# Patient Record
Sex: Female | Born: 1960 | Race: White | Hispanic: No | Marital: Married | Smoking: Never smoker
Health system: Southern US, Community
[De-identification: ages and names within clinical notes are randomized; demographics above are authoritative.]

## PROBLEM LIST (undated history)

## (undated) DIAGNOSIS — E039 Hypothyroidism, unspecified: Secondary | ICD-10-CM

## (undated) DIAGNOSIS — C187 Malignant neoplasm of sigmoid colon: Secondary | ICD-10-CM

## (undated) DIAGNOSIS — R112 Nausea with vomiting, unspecified: Secondary | ICD-10-CM

## (undated) DIAGNOSIS — R0600 Dyspnea, unspecified: Secondary | ICD-10-CM

## (undated) DIAGNOSIS — I5189 Other ill-defined heart diseases: Secondary | ICD-10-CM

## (undated) DIAGNOSIS — R002 Palpitations: Secondary | ICD-10-CM

## (undated) DIAGNOSIS — J449 Chronic obstructive pulmonary disease, unspecified: Secondary | ICD-10-CM

## (undated) DIAGNOSIS — I10 Essential (primary) hypertension: Secondary | ICD-10-CM

## (undated) DIAGNOSIS — D649 Anemia, unspecified: Secondary | ICD-10-CM

## (undated) DIAGNOSIS — G4730 Sleep apnea, unspecified: Secondary | ICD-10-CM

## (undated) DIAGNOSIS — R531 Weakness: Secondary | ICD-10-CM

## (undated) DIAGNOSIS — M1990 Unspecified osteoarthritis, unspecified site: Secondary | ICD-10-CM

## (undated) DIAGNOSIS — E669 Obesity, unspecified: Secondary | ICD-10-CM

## (undated) DIAGNOSIS — F329 Major depressive disorder, single episode, unspecified: Secondary | ICD-10-CM

## (undated) DIAGNOSIS — J189 Pneumonia, unspecified organism: Secondary | ICD-10-CM

## (undated) DIAGNOSIS — R9431 Abnormal electrocardiogram [ECG] [EKG]: Secondary | ICD-10-CM

## (undated) DIAGNOSIS — E785 Hyperlipidemia, unspecified: Secondary | ICD-10-CM

## (undated) DIAGNOSIS — F32A Depression, unspecified: Secondary | ICD-10-CM

## (undated) DIAGNOSIS — Z9889 Other specified postprocedural states: Secondary | ICD-10-CM

## (undated) HISTORY — PX: CATARACT EXTRACTION: SUR2

## (undated) HISTORY — PX: CHOLECYSTECTOMY: SHX55

---

## 2003-03-10 ENCOUNTER — Emergency Department (HOSPITAL_COMMUNITY): Admission: EM | Admit: 2003-03-10 | Discharge: 2003-03-10 | Payer: Self-pay | Admitting: *Deleted

## 2003-03-10 ENCOUNTER — Encounter: Payer: Self-pay | Admitting: *Deleted

## 2005-01-26 ENCOUNTER — Ambulatory Visit: Payer: Self-pay | Admitting: Family Medicine

## 2005-02-10 ENCOUNTER — Ambulatory Visit: Payer: Self-pay | Admitting: Family Medicine

## 2005-03-03 ENCOUNTER — Ambulatory Visit: Payer: Self-pay | Admitting: Family Medicine

## 2005-04-26 ENCOUNTER — Ambulatory Visit: Payer: Self-pay | Admitting: Family Medicine

## 2005-07-28 ENCOUNTER — Ambulatory Visit: Payer: Self-pay | Admitting: Family Medicine

## 2005-09-16 ENCOUNTER — Ambulatory Visit: Payer: Self-pay | Admitting: Family Medicine

## 2006-01-19 ENCOUNTER — Ambulatory Visit: Payer: Self-pay | Admitting: Family Medicine

## 2006-02-08 ENCOUNTER — Ambulatory Visit: Payer: Self-pay | Admitting: Family Medicine

## 2006-03-09 ENCOUNTER — Ambulatory Visit: Payer: Self-pay | Admitting: Family Medicine

## 2006-04-27 ENCOUNTER — Ambulatory Visit: Payer: Self-pay | Admitting: Family Medicine

## 2006-06-09 ENCOUNTER — Ambulatory Visit: Payer: Self-pay | Admitting: Family Medicine

## 2006-07-28 ENCOUNTER — Ambulatory Visit: Payer: Self-pay | Admitting: Family Medicine

## 2006-10-06 ENCOUNTER — Ambulatory Visit: Payer: Self-pay | Admitting: Family Medicine

## 2006-11-08 ENCOUNTER — Ambulatory Visit: Payer: Self-pay | Admitting: Family Medicine

## 2006-12-30 ENCOUNTER — Ambulatory Visit: Payer: Self-pay | Admitting: Family Medicine

## 2007-04-19 ENCOUNTER — Ambulatory Visit: Payer: Self-pay | Admitting: Family Medicine

## 2007-05-29 ENCOUNTER — Ambulatory Visit: Payer: Self-pay | Admitting: Family Medicine

## 2007-06-15 ENCOUNTER — Emergency Department (HOSPITAL_COMMUNITY): Admission: EM | Admit: 2007-06-15 | Discharge: 2007-06-15 | Payer: Self-pay | Admitting: Emergency Medicine

## 2007-06-15 IMAGING — CR DG CHEST 2V
2 series · 2 of 2 positions shown · non-contrast
Comparison: None.

CLINICAL DATA: 46 year-old-female with shortness of breath and weakness. 
 CHEST - 2 VIEW:

[w chest pa]
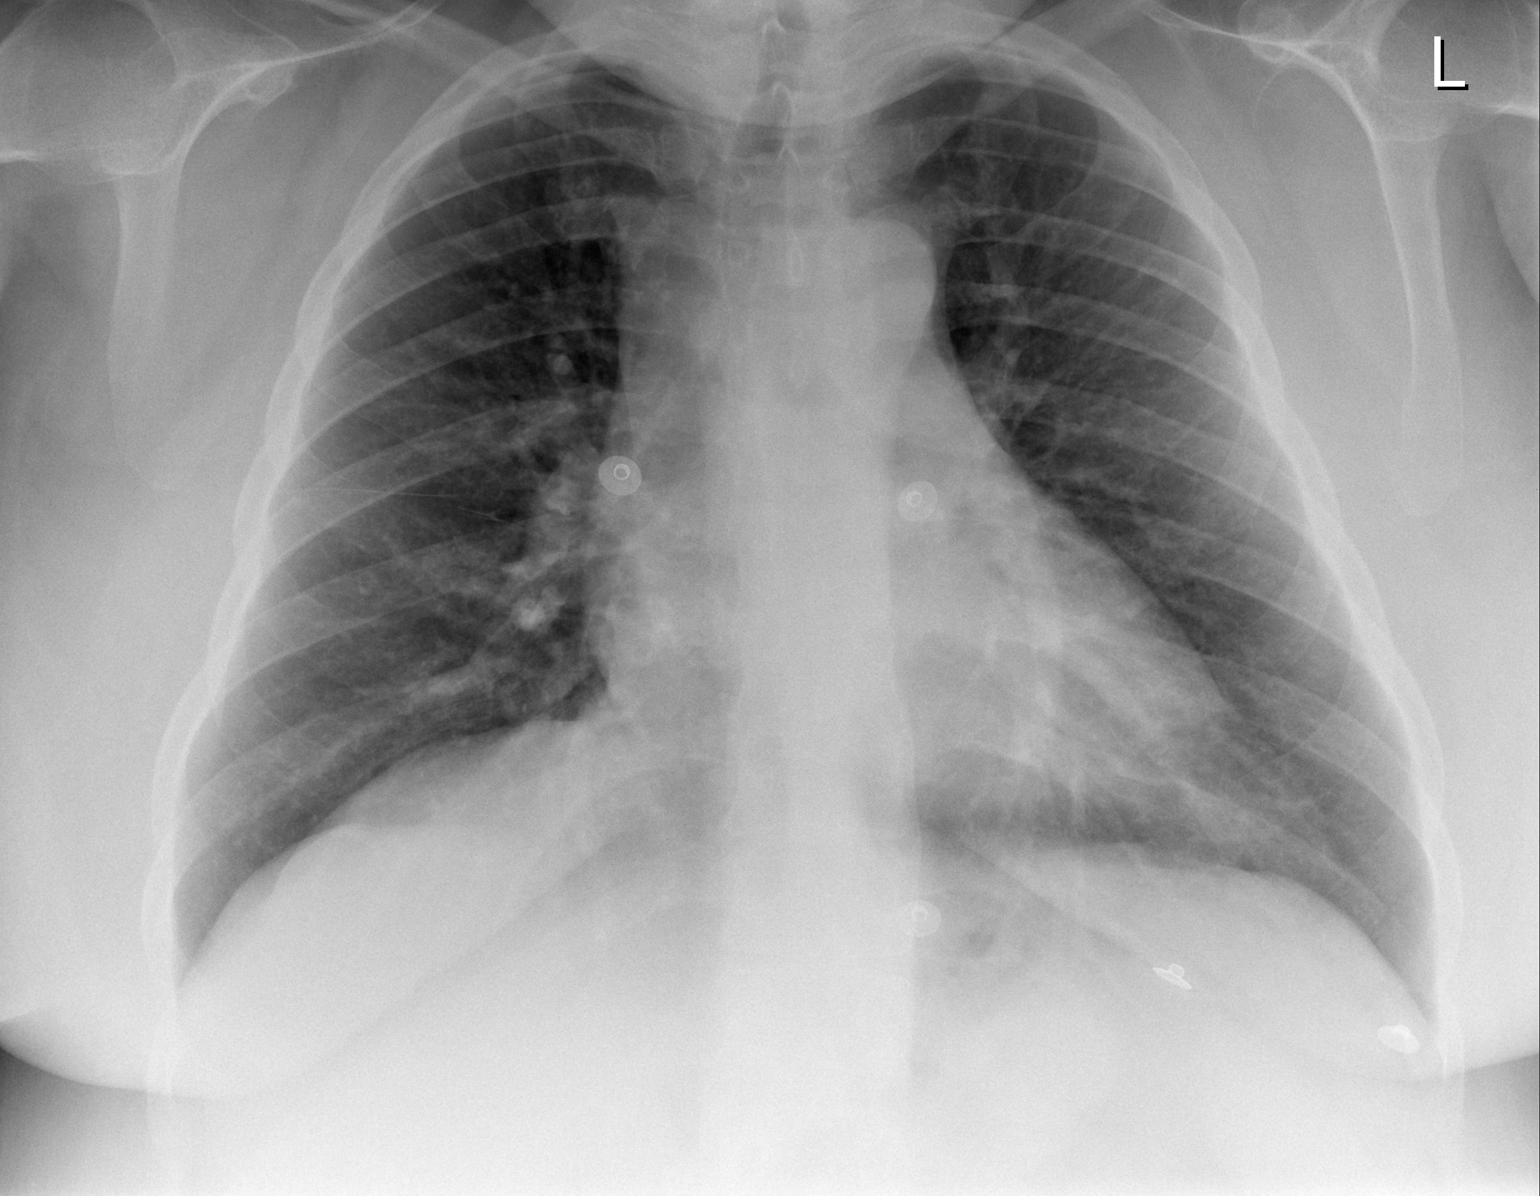

[w chest lat]
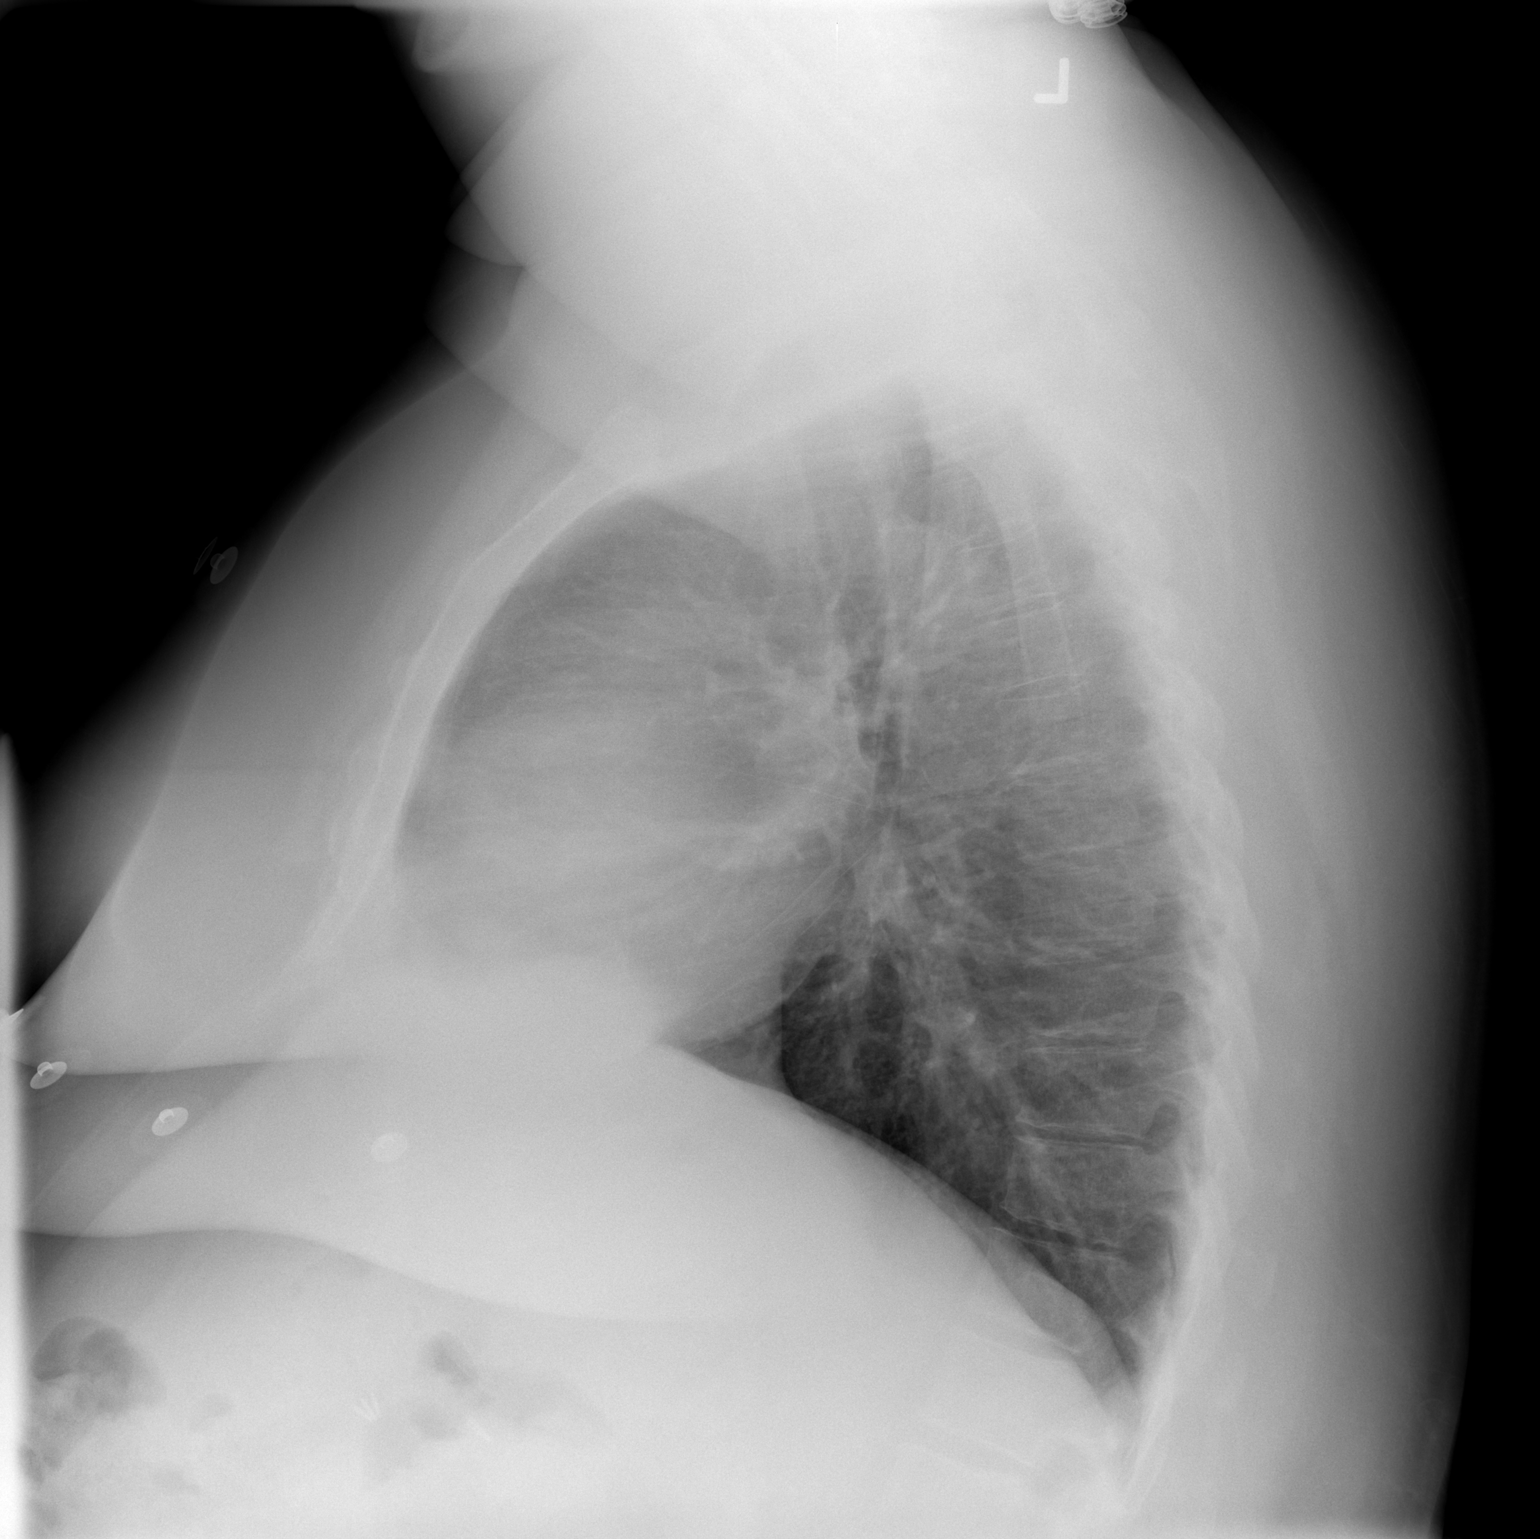

[2 of 2 positions shown; findings below may reference images not displayed]

FINDINGS: The cardiac silhouette is borderline enlarged.   The mediastinal contours are within normal limits given the patient?s body habitus.  Mild vascular congestion but no overt pulmonary edema.   No pleural effusions.  No focal infiltrates.  Bony structures are intact.
IMPRESSION: Borderline cardiac size and mild vascular congestion but no edema, infiltrates or effusions.

## 2008-01-17 ENCOUNTER — Encounter: Admission: RE | Admit: 2008-01-17 | Discharge: 2008-01-17 | Payer: Self-pay | Admitting: Cardiology

## 2008-01-17 IMAGING — CT CT ANGIO CHEST
3 of 5 series · 19 of 36 positions shown · IV contrast ([ID] OMNI 300)
Comparison: None.

CLINICAL DATA: Back pain and shortness of breath. 
 CT ANGIO CHEST:
TECHNIQUE: Multidetector CT imaging of the chest was performed before and during bolus injection of intravenous contrast.  Multiplanar CT angiographic image reconstructions were generated to evaluate the vascular anatomy.
 Contrast:  125 cc Omnipaque 300

[Series 4: pe study · axial · 0.78mm/px · z∈[-310,-44]mm · 12 of 253 slices shown]
[im 20/253  lung]
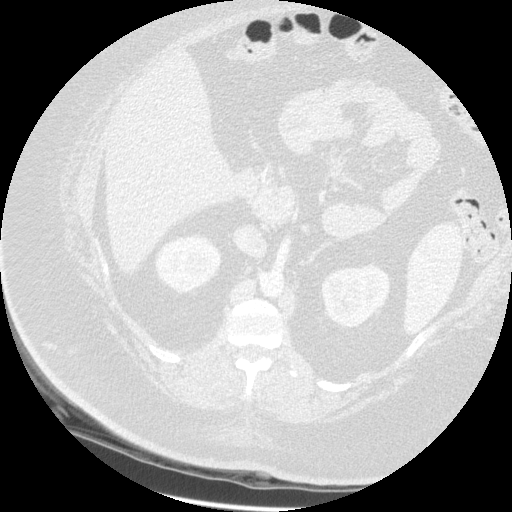
[im 39/253  mediastinal]
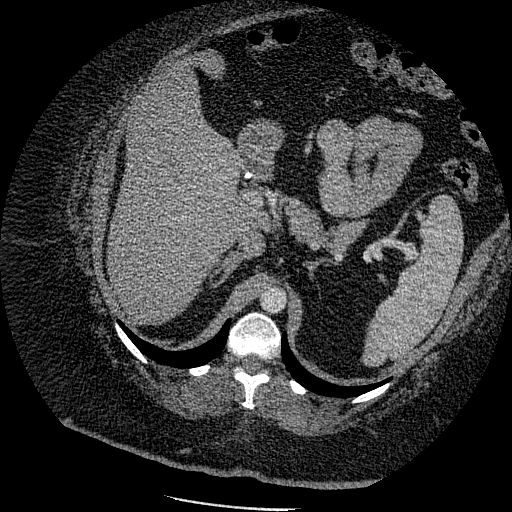
[im 59/253  lung]
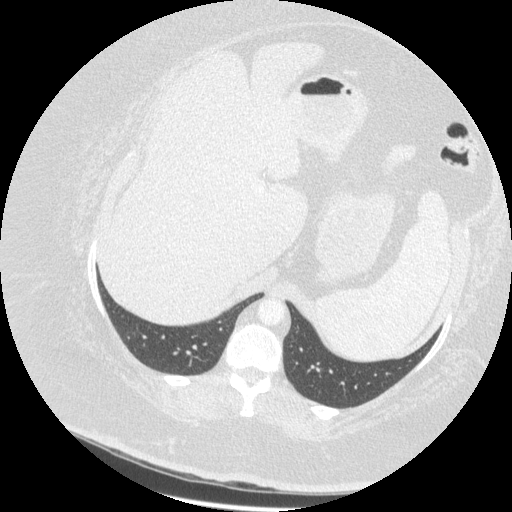
[im 78/253  mediastinal]
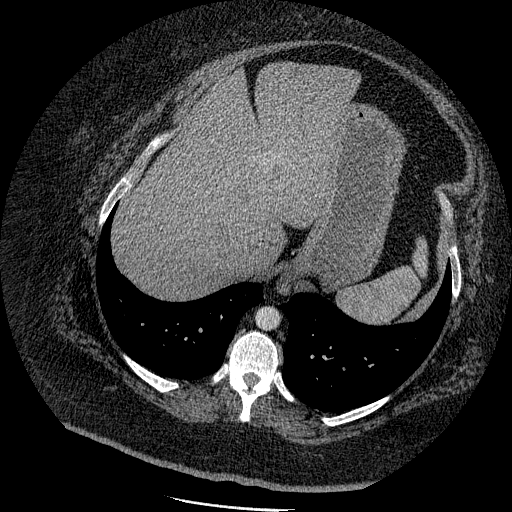
[im 97/253  lung]
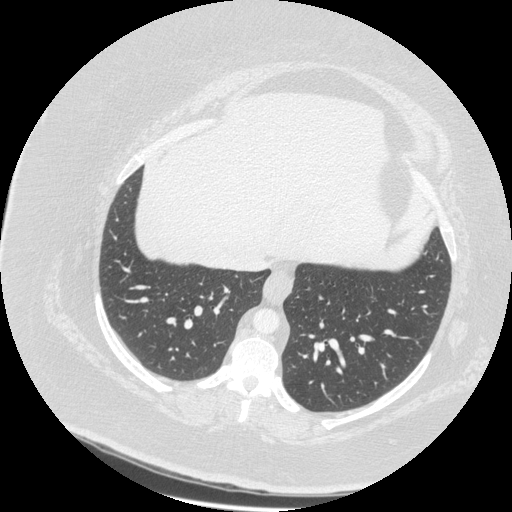
[im 117/253  mediastinal]
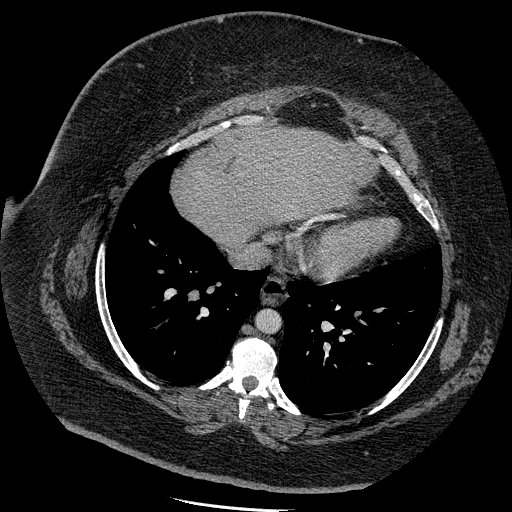
[im 136/253  lung]
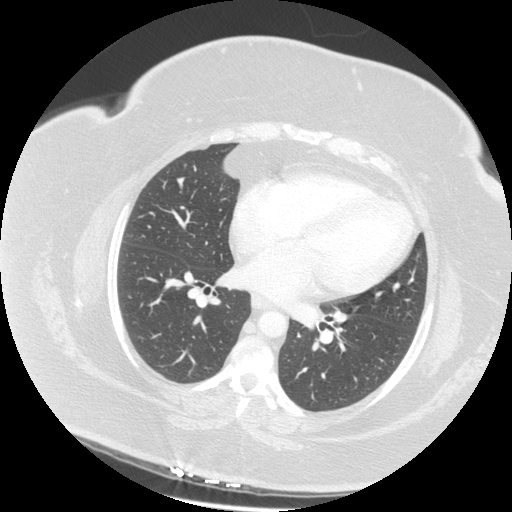
[im 156/253  mediastinal]
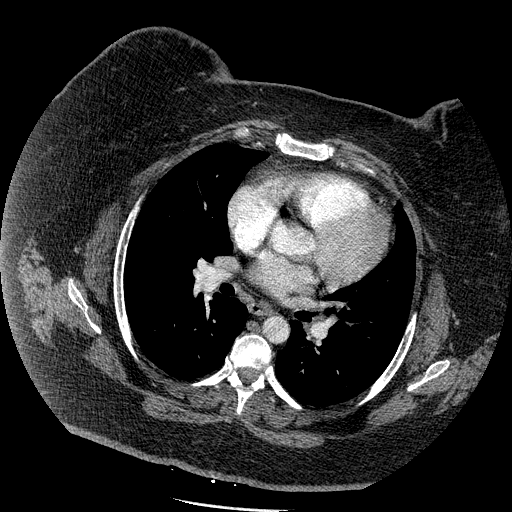
[im 175/253  lung]
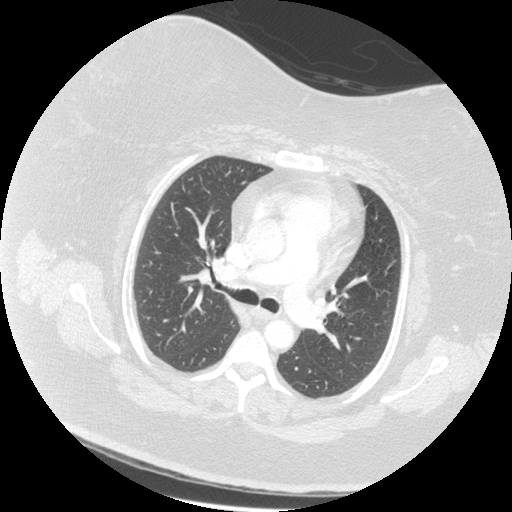
[im 194/253  mediastinal]
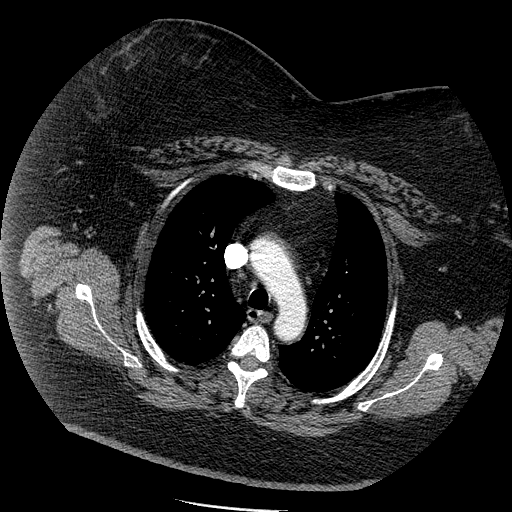
[im 214/253  lung]
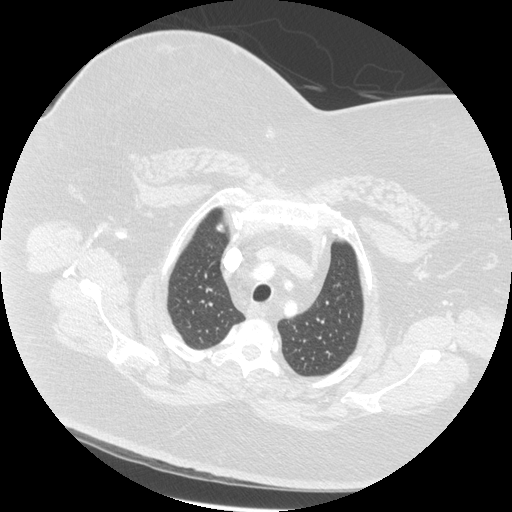
[im 233/253  mediastinal]
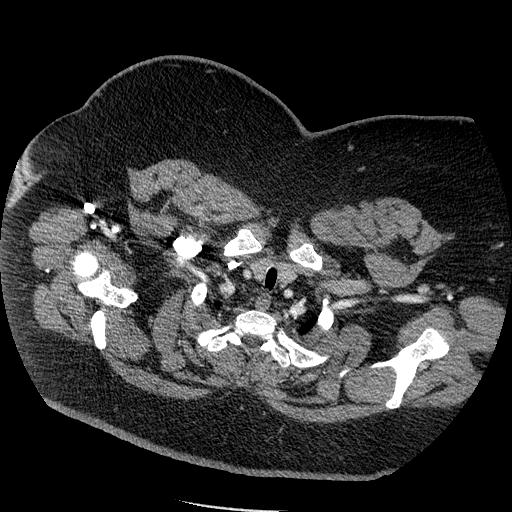

[Series 6: lung windows · axial · 0.78mm/px · z∈[-259,-117]mm · 4 of 116 slices shown]
[im 20/116  mediastinal]
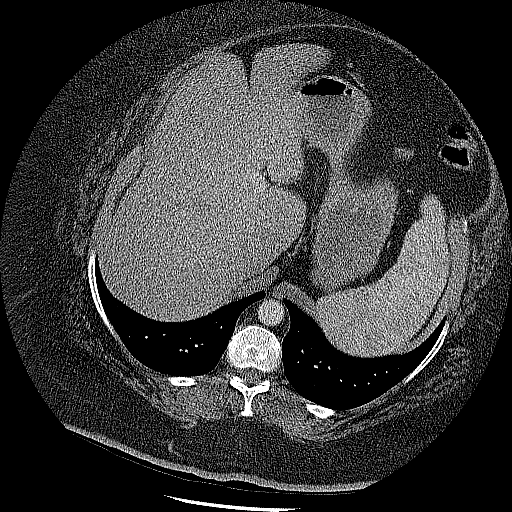
[im 39/116  mediastinal]
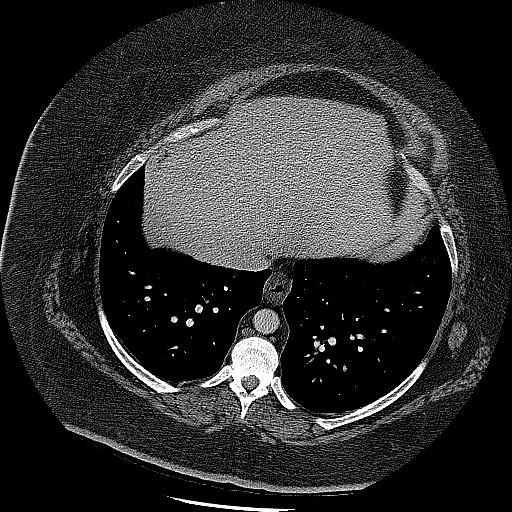
[im 58/116  mediastinal]
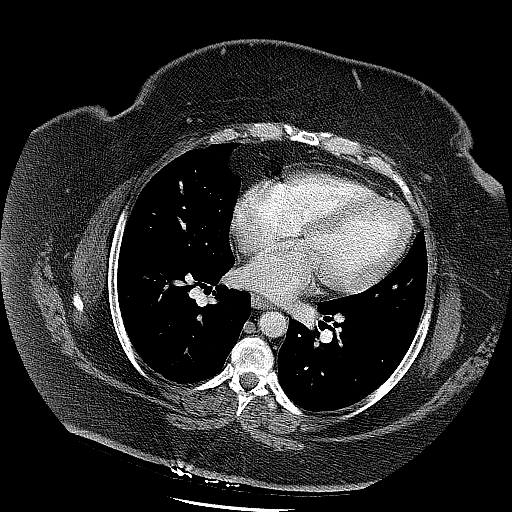
[im 77/116  mediastinal]
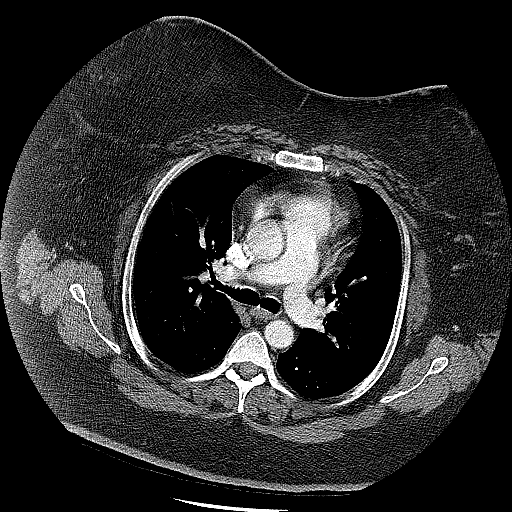

[Series 602: sagittal body · sagittal · 0.78mm/px · 3 of 161 slices shown]
[im 33/161  mediastinal]
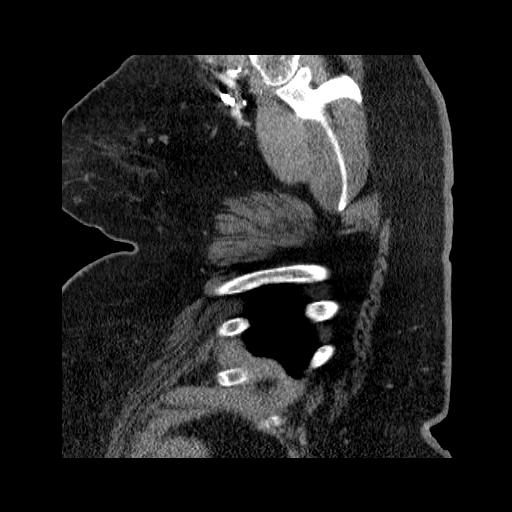
[im 65/161  mediastinal]
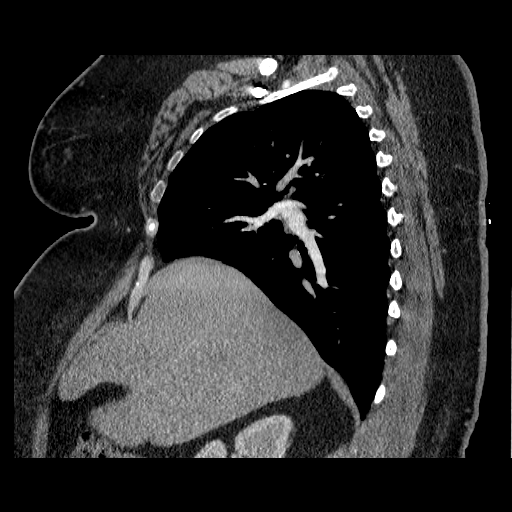
[im 97/161  mediastinal]
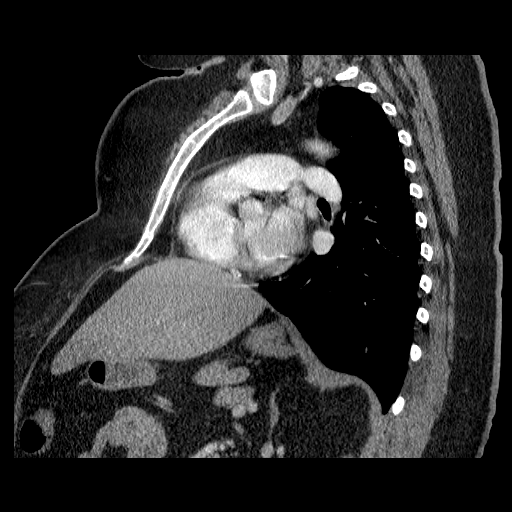

[19 of 36 positions shown; findings below may reference images not displayed]

FINDINGS: No pulmonary embolus.  No pathologically enlarged mediastinal, hilar or axillary lymph nodes.  Heart is mildly enlarged.  No pericardial effusion.  Small hiatal hernia.  
 Lungs are clear.  No pleural fluid.  Airway unremarkable. 
 Incidental imaging of the upper abdomen shows a cholecystectomy.  No acute findings.  No worrisome lytic or sclerotic lesions.
IMPRESSION: No pulmonary embolus.  No acute findings.

## 2008-09-01 ENCOUNTER — Ambulatory Visit: Payer: Self-pay | Admitting: Cardiology

## 2008-09-01 IMAGING — CR DG CHEST 1V PORT
1 series · 1 of 1 positions shown · non-contrast
Comparison: CT chest [DATE].

CLINICAL DATA: Chest pain.  Nausea.  Vomiting.  Weakness.

PORTABLE CHEST - 1 VIEW

[view not recorded]
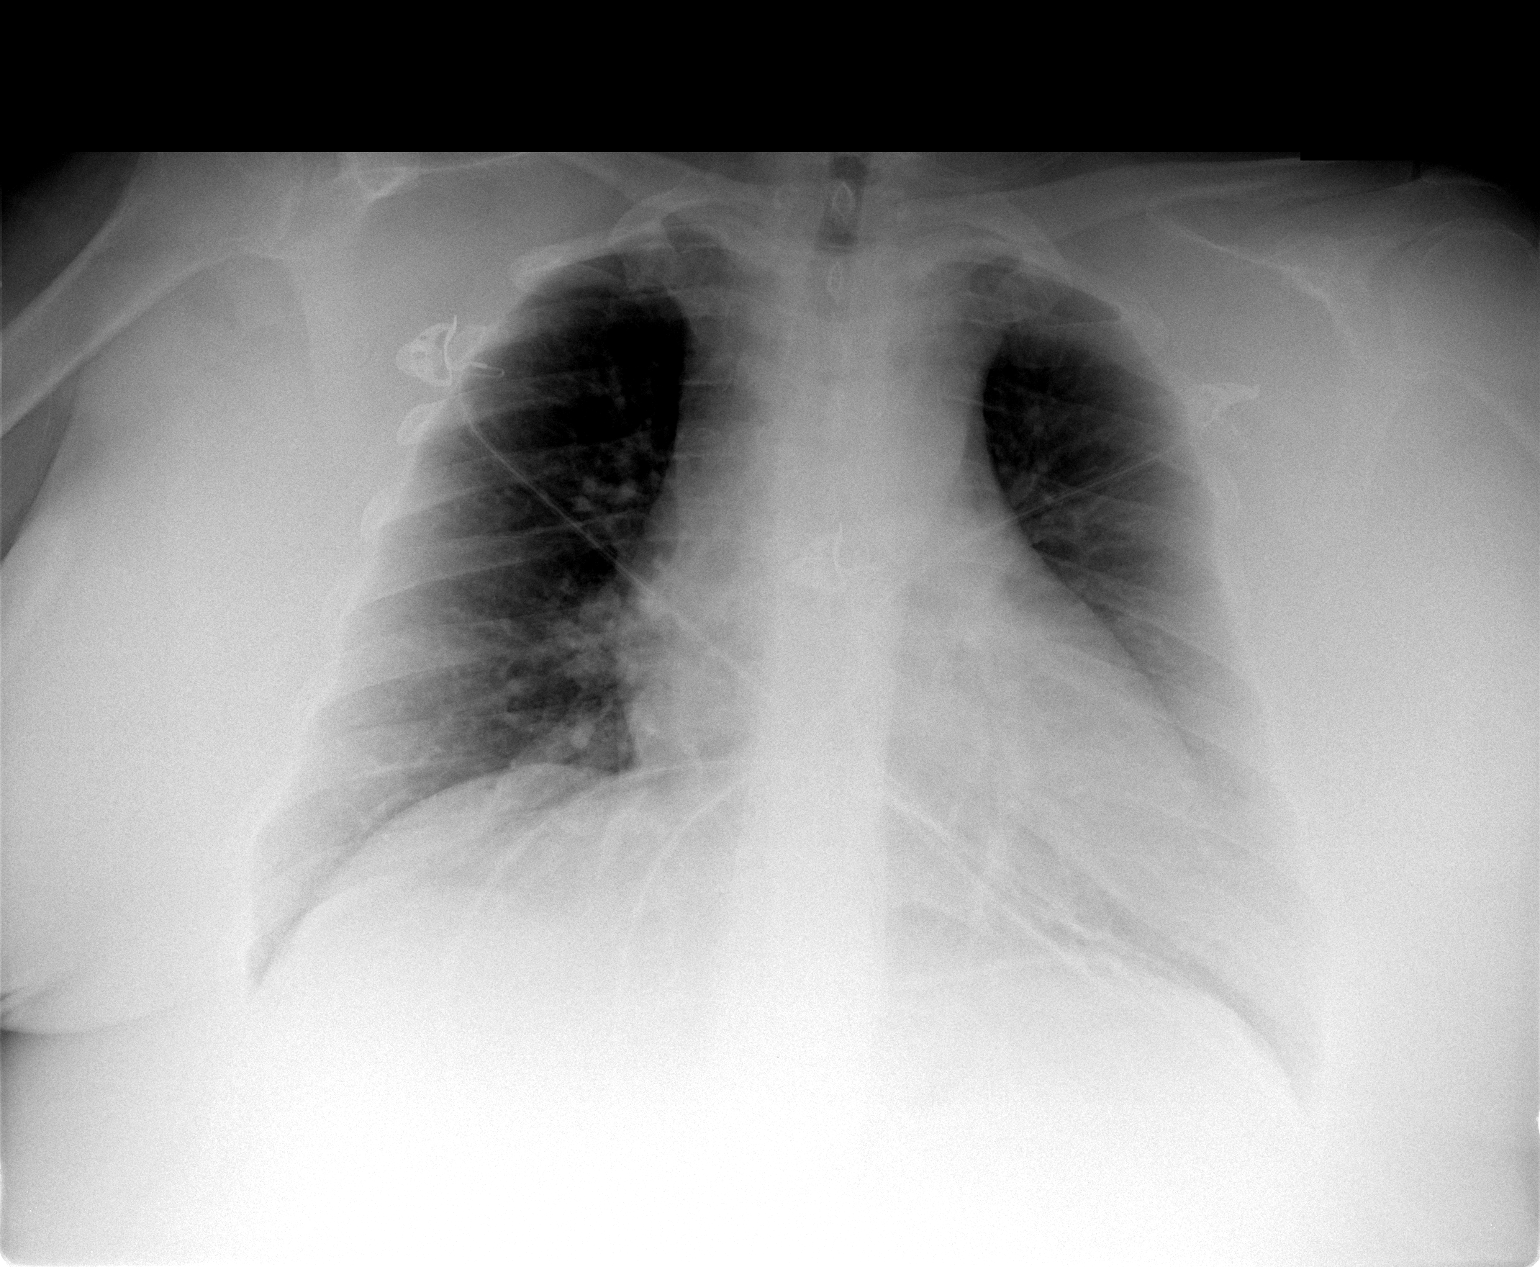

[1 of 1 positions shown; findings below may reference images not displayed]

FINDINGS: Exam limited by patient body habitus.  Cardiopericardial
silhouette appears within normal limits.  Mediastinal lipomatosis
identified on prior CT scan produces widening of the upper
mediastinum.  Trachea is midline.  No focal airspace opacities.  No
pleural effusion.
IMPRESSION: 1.  No active cardiopulmonary disease.
2.  Exam limited by patient body habitus.

## 2008-09-02 ENCOUNTER — Observation Stay (HOSPITAL_COMMUNITY): Admission: EM | Admit: 2008-09-02 | Discharge: 2008-09-02 | Payer: Self-pay | Admitting: Emergency Medicine

## 2008-09-02 ENCOUNTER — Encounter (INDEPENDENT_AMBULATORY_CARE_PROVIDER_SITE_OTHER): Payer: Self-pay | Admitting: Internal Medicine

## 2008-09-02 IMAGING — CT CT ANGIO CHEST
1 of 2 series · 19 of 32 positions shown · IV contrast (Omnipaque 300)
Comparison: Radiography same day.  Previous CT [DATE]

CLINICAL DATA: Chest pain.  Elevated D-dimer.  Asthma.
Hypertension.  Diabetes.

CT ANGIOGRAPHY CHEST
TECHNIQUE: Multidetector CT imaging of the chest using the
standard protocol during bolus administration of intravenous
contrast. Multiplanar reconstructed images obtained and reviewed to
evaluate the vascular anatomy.
Contrast: 100 ml [C3]

[Series 9: thin pacs · axial · 0.68mm/px · z∈[-320,-66]mm · 19 of 284 slices shown]
[im 15/284  lung]
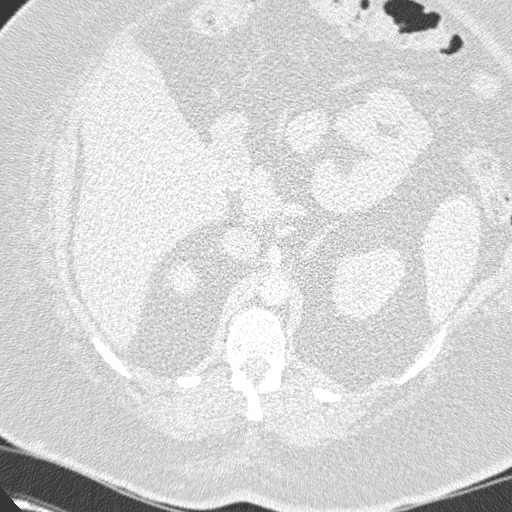
[im 29/284  mediastinal]
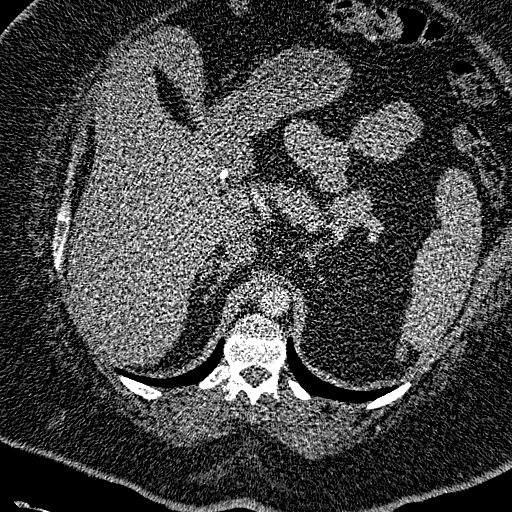
[im 43/284  lung]
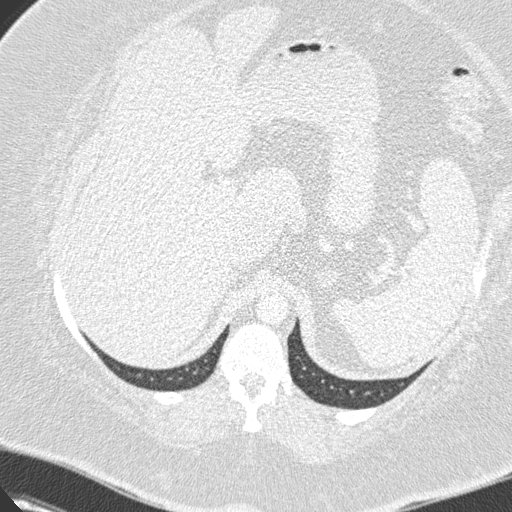
[im 71/284  mediastinal]
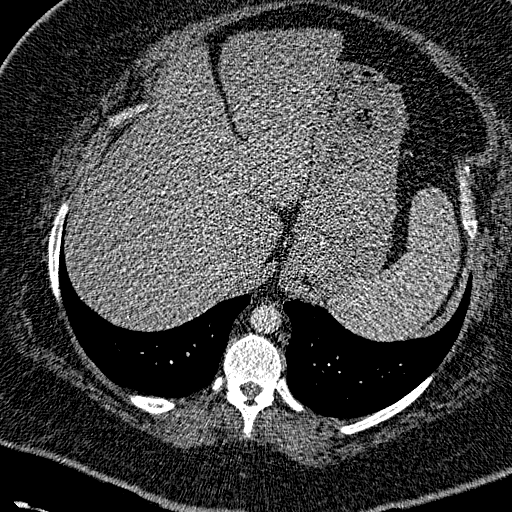
[im 85/284  lung]
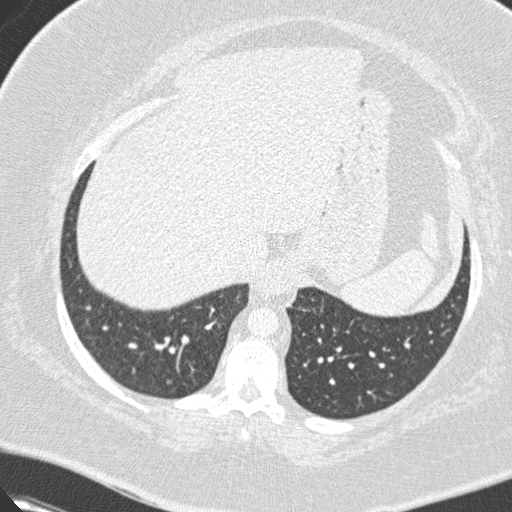
[im 95/284  mediastinal]
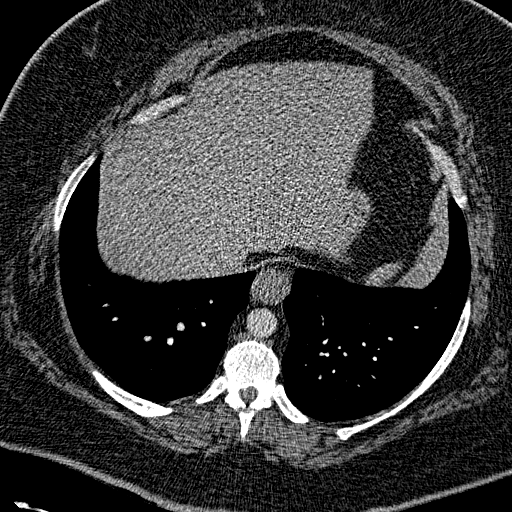
[im 100/284  lung]
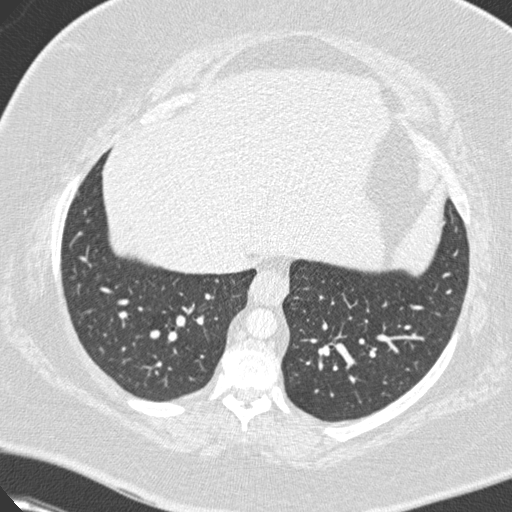
[im 114/284  mediastinal]
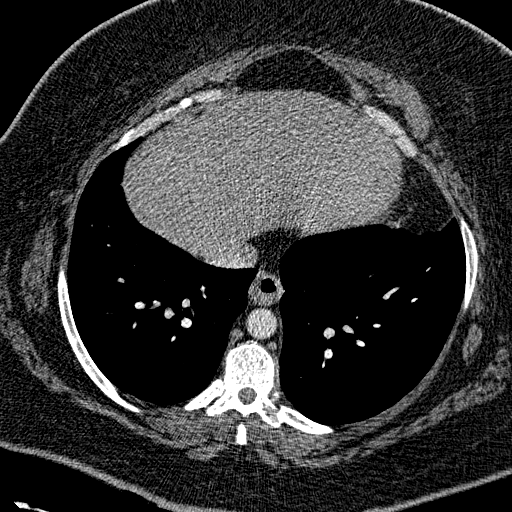
[im 128/284  lung]
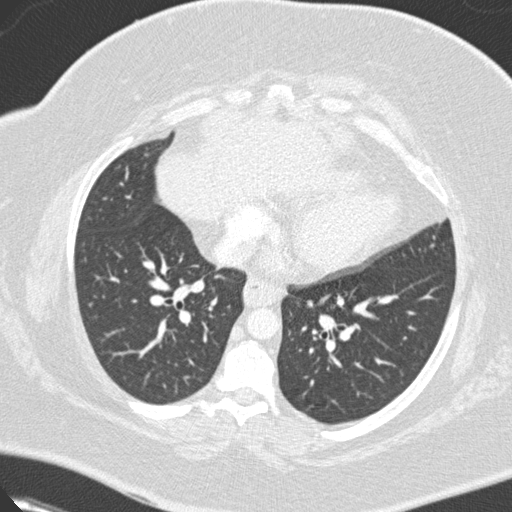
[im 142/284  mediastinal]
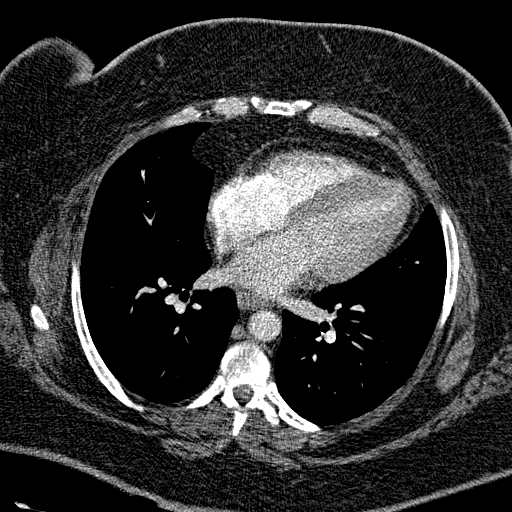
[im 156/284  lung]
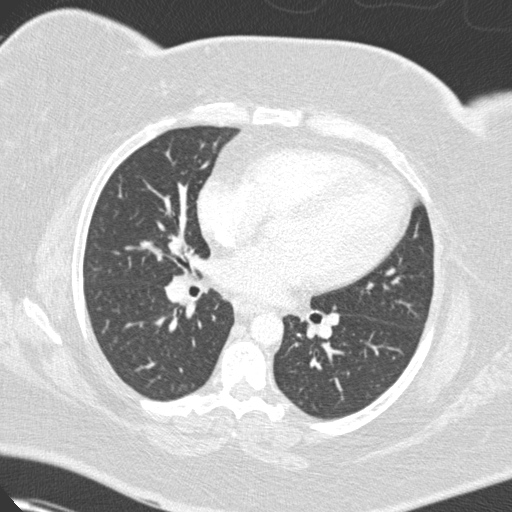
[im 170/284  mediastinal]
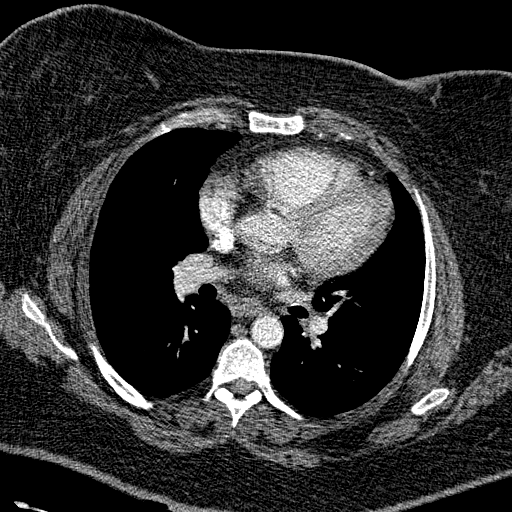
[im 184/284  lung]
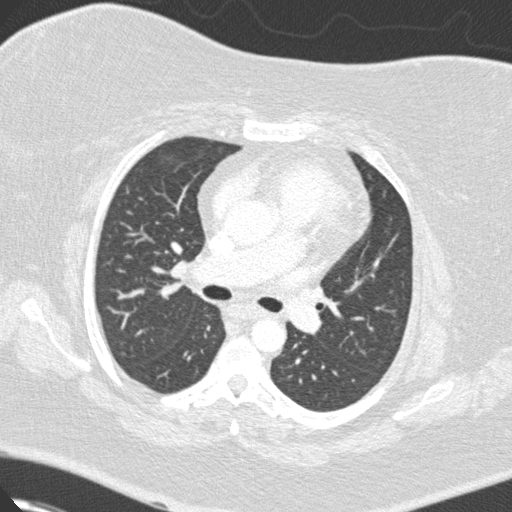
[im 189/284  mediastinal]
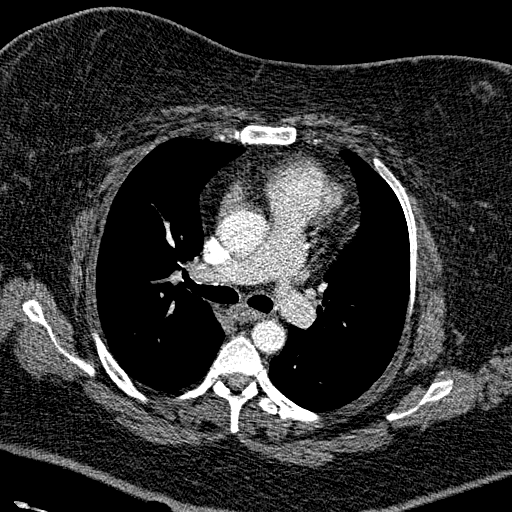
[im 199/284  lung]
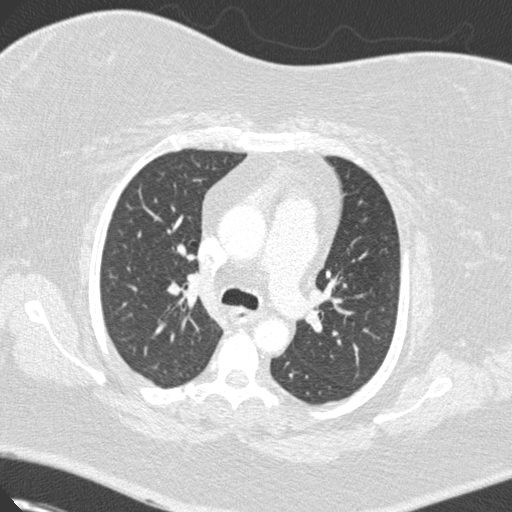
[im 213/284  mediastinal]
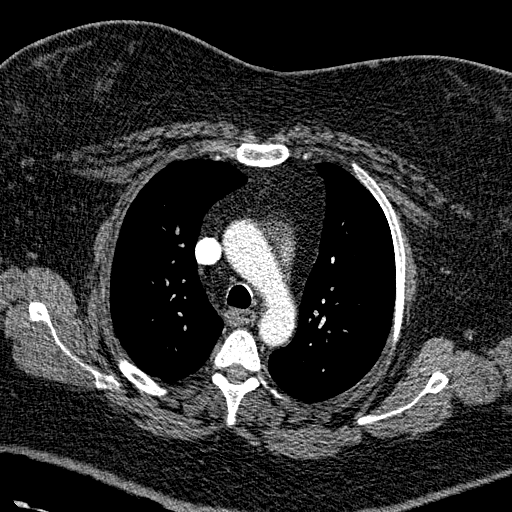
[im 241/284  lung]
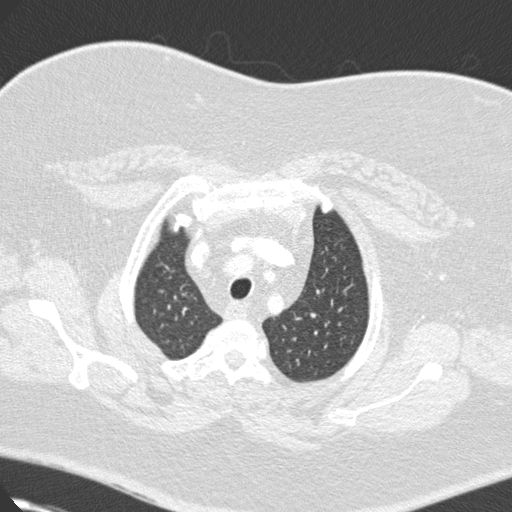
[im 255/284  mediastinal]
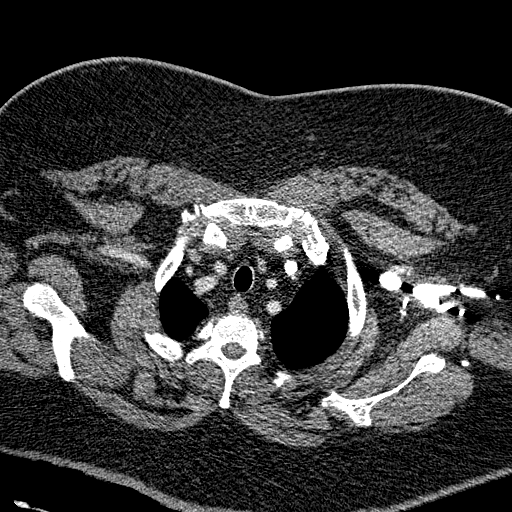
[im 269/284  lung]
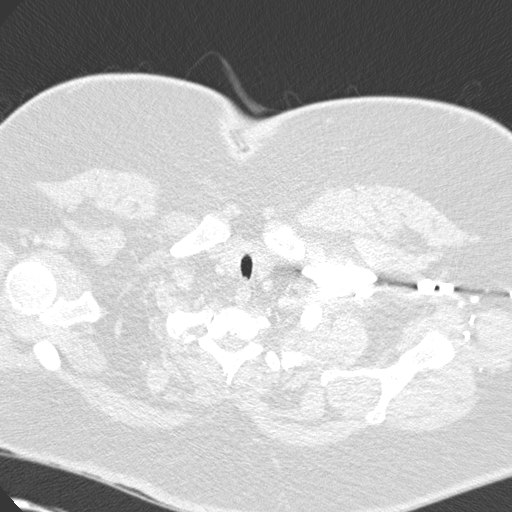

[19 of 32 positions shown; findings below may reference images not displayed]

FINDINGS: Pulmonary artery opacification is only moderate.  I do
not see any filling defects to diagnose embolic disease, but this
is not a high contrast examination.  The lungs are clear.  No
effusions.  No mediastinal or hilar mass or adenopathy.  Scans in
the upper abdomen are unremarkable.
IMPRESSION: Opacification of the pulmonary arterial tree is only moderate
because of utilization of a small IV and poor bolus timing.  No
pulmonary emboli are seen, but confidence level is not that of an
optimally opacified exam.

No other disease evident in the region.

## 2008-09-03 ENCOUNTER — Encounter (HOSPITAL_COMMUNITY): Admission: RE | Admit: 2008-09-03 | Discharge: 2008-10-03 | Payer: Self-pay | Admitting: Cardiology

## 2008-09-03 ENCOUNTER — Ambulatory Visit: Payer: Self-pay | Admitting: Cardiology

## 2008-09-03 IMAGING — NM NM MYOCAR PERF EJECTION FRACTION
1 series · 6 of 6 positions shown · non-contrast
Comparison: none

[DATE] - DUPLICATE COPY for exam association in [facility] – No change from original report.
 Ordering Physician: ABDOLMOTALEB

 ABDOLMOTALEB Physician: [REDACTED]al Data: 70-year-old woman with recent admission to hospital
 for chest pain; mild left atrial enlargement with left ventricular
 hypertrophy on echocardiography; CT scan was suboptimal but
 negative.
 NUCLEAR MEDICINE STRESS MYOVIEW STUDY WITH SPECT AND LEFT
 VENTRICULAR EJECTION FRACTION
 Radionuclide Data: One-day rest/stress protocol performed with
 [DATE] mCi of [VX] Myoview.
 Stress Data: Treadmill exercise performed to a workload of five
 mets and a heart rate of 150, 86% of age - predicted maximum.
 Exercise discontinued due to severe dyspnea; no chest pain
 reported. Blood pressure increased from a resting value of 140/80
 EKG: Normal sinus rhythm; low voltage in the chest leads; minimally
 delayed R-wave progression.
 Stress EKG: Insignificant upsloping ST-segment depression.
 Scintigraphic Data: Acquisition notable for moderate to marked
 breast attenuation. Left ventricular size was normal. On
 tomographic images reconstructed in standard planes, there was a
 slight decrease in activity in the anterior wall. This was not
 numerically significant by quantitative analysis, and no
 reversibility was apparent. The gated reconstruction demonstrated
 normal to hyperdynamic regional and global to ventricular systolic
 function with an estimated ejection fraction in excess of 0.65.
 There was normal systolic accentuation of activity throughout.

[Series 1: cs cardiac tc hi dose · 6.52mm/px · 6 of 511 frames shown]
[frame 43/511]
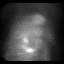
[frame 128/511]
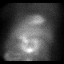
[frame 213/511]
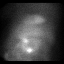
[frame 298/511]
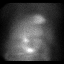
[frame 383/511]
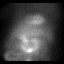
[frame 469/511]
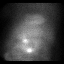

[6 of 6 positions shown; findings below may reference images not displayed]

IMPRESSION: Negative stress nuclear myocardial study revealing impaired
 exercise capacity, a normal stress EKG, normal left ventricular
 size and normal left ventricular systolic function. By
 scintigraphic imaging, there was modest breast attenuation without
 convincing evidence for ischemia or infarction. Other findings as
 noted.

## 2008-09-03 IMAGING — NM NM MYOCAR MULTI W/ SPECT
1 series · 6 of 6 positions shown · non-contrast
Comparison: none

[DATE] - DUPLICATE COPY for exam association in [facility] – No change from original report.
 Ordering Physician: ABDOLMOTALEB

 ABDOLMOTALEB Physician: [REDACTED]al Data: 70-year-old woman with recent admission to hospital
 for chest pain; mild left atrial enlargement with left ventricular
 hypertrophy on echocardiography; CT scan was suboptimal but
 negative.
 NUCLEAR MEDICINE STRESS MYOVIEW STUDY WITH SPECT AND LEFT
 VENTRICULAR EJECTION FRACTION
 Radionuclide Data: One-day rest/stress protocol performed with
 [DATE] mCi of [VX] Myoview.
 Stress Data: Treadmill exercise performed to a workload of five
 mets and a heart rate of 150, 86% of age - predicted maximum.
 Exercise discontinued due to severe dyspnea; no chest pain
 reported. Blood pressure increased from a resting value of 140/80
 EKG: Normal sinus rhythm; low voltage in the chest leads; minimally
 delayed R-wave progression.
 Stress EKG: Insignificant upsloping ST-segment depression.
 Scintigraphic Data: Acquisition notable for moderate to marked
 breast attenuation. Left ventricular size was normal. On
 tomographic images reconstructed in standard planes, there was a
 slight decrease in activity in the anterior wall. This was not
 numerically significant by quantitative analysis, and no
 reversibility was apparent. The gated reconstruction demonstrated
 normal to hyperdynamic regional and global to ventricular systolic
 function with an estimated ejection fraction in excess of 0.65.
 There was normal systolic accentuation of activity throughout.

[Series 1: cr cardiac tc low dose · 6.52mm/px · 6 of 64 frames shown]
[frame 6/64]
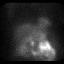
[frame 16/64]
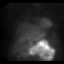
[frame 27/64]
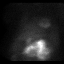
[frame 38/64]
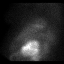
[frame 48/64]
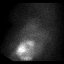
[frame 59/64]
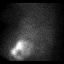

[6 of 6 positions shown; findings below may reference images not displayed]

IMPRESSION: Negative stress nuclear myocardial study revealing impaired
 exercise capacity, a normal stress EKG, normal left ventricular
 size and normal left ventricular systolic function. By
 scintigraphic imaging, there was modest breast attenuation without
 convincing evidence for ischemia or infarction. Other findings as
 noted.

## 2009-08-19 ENCOUNTER — Emergency Department (HOSPITAL_COMMUNITY): Admission: EM | Admit: 2009-08-19 | Discharge: 2009-08-19 | Payer: Self-pay | Admitting: Emergency Medicine

## 2011-03-12 LAB — GLUCOSE, CAPILLARY: Glucose-Capillary: 130 mg/dL — ABNORMAL HIGH (ref 70–99)

## 2011-04-20 NOTE — Consult Note (Signed)
[name], [name]           ACCOUNT NO.:  [id number]   MEDICAL RECORD NO.:  [id number]          PATIENT TYPE:  INP   LOCATION:  IC08                          FACILITY:  [facility]   PHYSICIAN:  Gerrit Friends. Dietrich Pates, MD, FACCDATE OF BIRTH:  1961-06-06   DATE OF CONSULTATION:  DATE OF DISCHARGE:                                 CONSULTATION   CARDIOLOGIST:  Colleen Can. Deborah Chalk, M.D.   PRIMARY CARE PHYSICIAN:  Delaney Meigs, M.D.   REFERRING PHYSICIAN:  [facility]   REASON FOR CONSULTATION:  Chest pain.   HISTORY OF PRESENT ILLNESS:  [name] is a 50 year old female  patient with no known coronary artery disease, but significant cardiac  risk factors including diabetes mellitus, hypertension, hyperlipidemia  who presented to [facility] yesterday with complaints of chest  pain.  She has been seen by Dr. Deborah Chalk in [place] for routine care  in the past.  She apparently had a stress test some 2 years ago and an  echocardiogram sometime this year.  She tells me that yesterday about 30  minutes after eating, she developed some dysphagia and then developed  substernal and epigastric chest pressure, associated nausea and vomiting  x2.  She notes some increased difficulty with breathing, but no  diaphoresis, arm or jaw pain or syncope.  Her pain lasted about 3 hours.  She notes that she received aspirin the emergency room.  She is now pain  free.  Her D-dimer is slightly elevated and a chest CT is pending.  Her  cardiac markers have been negative thus far.  We are now asked to  further evaluate.   PAST MEDICAL HISTORY:  As outlined above.  She also has:  1. Peripheral neuropathy.  2. Asthma.  3. GERD.  4. She is status post cholecystectomy and C-section.  She apparently      had some lymph node removed from arm as well that was benign.  5. She notes a history of palpitations and has been worked up in the      past by Dr. Deborah Chalk.  Apparently she  had an event monitor that      revealednothing abnormal.   MEDICATIONS PRIOR TO ADMISSION:  1. Benicar 40 mg daily.  2. Humulin R sliding scale insulin.  Humulin N sliding scale insulin.  3. Lopressor 50 mg 1//2 tablet in the morning, 1/2 tablet at bedtime.  4. Ativan 0.5 mg 3 times daily.  5. Lipitor 40 mg daily.  6. Nexium 40 mg daily.  7. Lexapro 10 mg daily.  8. Allegra 108 mg daily.  9. Spironolactone unknown dosage b.i.d.   ALLERGIES:  CODEINE causes significant side effects.   SOCIAL HISTORY:  The patient lives in [place].  She is married, had two  children, one of them is deceased.  She denies any history of tobacco or  alcohol abuse.   FAMILY HISTORY:  Significant for CVA in her father.  He is still alive  on his 61s.  Her sister had a CVA in her 16s.  No premature CAD noted.   REVIEW OF SYSTEMS:  She denies  fevers, chills, headache, rash, dysuria,  hematuria, bright red blood per rectum or melena.  She has noticed some  dysphagia.  She notes chronic pedal edema without significant change.  She denies orthopnea or PND.  She notes chronic dyspnea with exertion.  She thinks it is slightly worse over the last 12 months.  She describes  NYHA class III to class IIIB symptoms.  She had noted increased weight  gain over the last several months as well.  The rest of the review of  systems is negative.   PHYSICAL EXAM:  She is a well-nourished, well-developed female whose  blood pressure is 118/53, pulse 96, respirations 19, temperature 98.3,  oxygen saturation 93% on 2 liters.  HEENT:  Normal.  NECK:  Without JVD.  LYMPH:  Without lymphadenopathy.  ENDOCRINE:  Without thyromegaly.  CARDIAC:  Normal S1, S2.  Regular rate and rhythm without murmur.  LUNGS:  With decreased breath sounds bilaterally.  No rales, no wheezes.  SKIN:  Without rash.  ABDOMEN:  Soft, with normoactive bowel sounds.  No organomegaly.  Positive epigastric tenderness with palpation.  EXTREMITIES:  1-2+  tight edema bilaterally.  MUSCULOSKELETAL:  Without joint deformity.  NEUROLOGIC:  She is alert and oriented x3.  Cranial nerves II-XII  grossly intact.   Chest x-ray:  No active disease.  EKG.  Sinus rhythm, heart rate is 78,  normal axis, no acute changes.   LABORATORIES:  CBC unavailable.  Sodium 136, potassium 3.9, BUN 17,  creatinine 1.13, glucose 209.  LFTs okay.  D-dimer 0.65, albumin 3.1.  Point of care markers negative x2.  Regular CK-MB and troponin I  negative x1.   ASSESSMENT/PLAN:  1. Chest pain in a  50 year old female patient with diabetes mellitus,      hypertension and hyperlipidemia.  She was also seen and examined by      Dr. Dietrich Pates.  Her symptoms are somewhat atypical.  Her cardiac      enzymes have been negative thus far and her ECG is unremarkable.      Her D-dimer is minimally elevated and a CT scan is currently      pending.  Her symptoms sound more gastrointestinal in nature, but      with significant risk factors she should have further workup to      rule out ischemic heart disease.  She will have one more set of      cardiac markers checked later today.  As long as these are normal,      she will be okay for discharge.  She will be set up for outpatient      stress testing tomorrow.  Of note, an echocardiogram is currently      pending.  2. Gastroesophageal reflux disease.  Question if this is the etiology      of her chest pain.  Her Nexium will be increased  to b.i.d. dosing.   DISPOSITION:  Thank you very much for consultation.  As noted above, if  he patient rules out for myocardial infarction, she will be discharged  to home with plans for outpatient stress testing.      Teresa Newcomer, PA-C      Gerrit Friends. Dietrich Pates, MD, [facility]  Electronically Signed    SW/MEDQ  D:  09/02/2008  T:  09/02/2008  Job:  [id number]   cc:   Colleen Can. Deborah Chalk, M.D.  Fax: [phone]   Delaney Meigs, M.D.  Fax: [phone]   [facility]

## 2011-04-20 NOTE — H&P (Signed)
[name], [name]           ACCOUNT NO.:  [id number]   MEDICAL RECORD NO.:  [id number]          PATIENT TYPE:  INP   LOCATION:  IC08                          FACILITY:  [facility]   PHYSICIAN:  Osvaldo Shipper, MD     DATE OF BIRTH:  [birth year]   DATE OF ADMISSION:  09/01/2008  DATE OF DISCHARGE:  LH                              HISTORY & PHYSICAL   PRIMARY MEDICAL DOCTOR:  Delaney Meigs, M.D.   ADMITTING DIAGNOSES:  1. Chest pain, rule out acute coronary syndrome.  2. Insulin-dependent diabetes.  3. Hypertension.  4. Dyslipidemia.  5. Morbid obesity.  6. History of acid reflux disease.   CHIEF COMPLAINT:  Chest pain yesterday evening.   HISTORY OF PRESENT ILLNESS:  The patient is a 50 year old Caucasian  female who has past medical history of insulin-dependent diabetes who is  obese, has dyslipidemia, hypertension, acid reflux disease, who was in  her usual state of health today when she went to a church about an hour  away from here in [place].  When she was returning from the church, she  experienced chest pain in the retrosternal area.  She described this  pain as a sharp pain.  The patient also felt that she was having pain in  her back.  The patient experienced some palpitations but denied any  shortness of breath, no cough, no fevers or chills.  She did feel a  little bit hot.  She had 2 episodes of emesis.  The pain lasted about 3  hours and then resolved on its own.  She did not take any medications.  There was no precipitant factor identified, no aggravating or relieving  factors identified.  Pain was 7/10 in intensity, and currently it is  about 0-1/10 intensity.  She does have a history of acid reflux and she  did feel that this could be acid reflux.  She admits to going for that 1  hour drive to the church.  Apart from that, there is no other history of  long drives.  There is no history of any sick contacts.  No history of  long plane trips recently.   She  mentions that she has never had this kind of chest pain in the past.  She has had a stress test done a year ago done by Dr. Deborah Chalk in  [place] and she tells me that it was a negative test result.   MEDICATIONS AT HOME:  1. Lopressor 25 mg b.i.d.  2. Benicar 40 mg daily.  3. Lexapro 10 mg daily.  4. Ativan 0.5 mg t.i.d.  5. Lasix 40 mg daily.  6. Lipitor 40 mg daily.  7. Nexium 40 mg daily.  8. Allegra 180 mg daily.  9. Spironolactone 25 mg b.i.d.  10.Advair Diskus twice daily.  11.Metolazone 5 mg daily.  12.She is on Humulin N 90 units in the morning, 85 at night, and she      is on Humulin R 50 in the morning, 35 at lunch, and 50 with supper.   ALLERGIES:  CODEINE.   PAST MEDICAL HISTORY:  1. Positive for insulin-dependent  diabetes.  2. Dyslipidemia.  3. Hypertension.  4. Obesity.  5. Depression.  6. Chronic lower extremity edema.   SURGICAL HISTORY INCLUDES:  1. Cholecystectomy.  2. C-section.  3. Cataract surgeries.   SOCIAL HISTORY:  Lives in [place] with her family.  Denies smoking,  alcohol or illicit drug use.  She is ambulatory with a cane.   FAMILY HISTORY:  Positive for a stroke in her mother and history of  diabetes.  Father died of an MI at the age of 62.  History of stroke in  her siblings as well.   REVIEW OF SYSTEMS:  GENERAL:  Review of systems positive for weakness.  HEENT:  Unremarkable.  CARDIOVASCULAR:  As in HPI.  RESPIRATORY:  As in  HPI.  GI:  Unremarkable.  GU:  Unremarkable.  MUSCULOSKELETAL:  Unremarkable.  NEUROLOGICAL:  Unremarkable.  PSYCHIATRIC:  Unremarkable.  DERMATOLOGIC:  Unremarkable.  Other systems unremarkable.   PHYSICAL EXAMINATION:  VITAL SIGNS:  Temperature 98.2, blood pressure  137/62, heart rate 84, respiratory rate 20, saturation 100% on room air.  GENERAL EXAM:  Shows a morbidly obese white female in no distress.  HEENT:  There is no pallor, no icterus, oral mucous membrane is moist.  No oral lesions are noted.  NECK:   Soft and supple.  No thyromegaly is appreciated.  LUNGS:  Clear to auscultation bilaterally.  No wheezing, rales or  rhonchi.  CARDIOVASCULAR:  S1 and S2 are normal, regular, no murmurs appreciated.  No S3, S4, no rubs, no bruits, no JVD is noted.  ABDOMEN:  Soft, nontender, nondistended.  No masses or organomegaly is  appreciated.  CHEST:  There is tenderness to palpation in the lower chest and over the  sternum.  Palpation does reproduce the pain that she has been  experiencing.  EXTREMITIES:  Show evidence for chronic skin changes.  Some edema is  noted.  Peripheral pulses are palpable.  NEUROLOGICALLY:  She is alert, oriented x3, no focal neurologic deficits  are present.   LABORATORY DATA:  Her CMET does show a high glucose of 209, otherwise  unremarkable.  Lipase is negative.  Two cardiac markers are negative.  She had a chest x-ray which showed normal cardiac size, no active  cardiopulmonary disease.  Examination was limited by patient's body  habitus.  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   EKG was done which shows sinus rhythm with a normal axis.  Intervals  appeared to be in the normal range.  No Q waves are present.  No  definite ST or T-wave changes of concern are noted on this EKG.   ASSESSMENT:  This is a 50 year old Caucasian female with multiple risk  factors for coronary artery disease who presents with chest pain.  EKG  is normal.  Differential diagnosis for the chest pain includes coronary  artery disease, pulmonary embolus, acid reflux disease.  Because of her  risk factors, the patient deserves observation in the hospital to rule  out acute coronary syndrome.   PLAN:  1. Chest pain, observe in the hospital.  Cycle cardiac markers.      Repeat EKGs.  We will also consult cardiologist to take a look at      her.  Echocardiogram will be ordered to get a  better look at her EF      and wall motion.  Aspirin initiated.  Will continue with  her beta      blockers for now.  No role for anticoagulation at this time.  A D-      dimer will be checked because of the sharp nature of the pain.  PPI      will be continued.  2. The rest of her medical issues are stable.  Continue management for      insulin-dependent diabetes, hypertension, dyslipidemia, lower      extremity swelling as before.  Continue with the Advair as well.  3. DVT and GI prophylaxis will be continued.      Osvaldo Shipper, MD  Electronically Signed     GK/MEDQ  D:  09/02/2008  T:  09/02/2008  Job:  [id number]

## 2011-04-23 NOTE — Discharge Summary (Signed)
NAMEVYCTORIA, [name]           ACCOUNT NO.:  [id number]   MEDICAL RECORD NO.:  [id number]          PATIENT TYPE:  INP   LOCATION:  IC08                          FACILITY:  [facility]   PHYSICIAN:  Margaretmary Dys, M.D.DATE OF BIRTH:  1961/04/05   DATE OF ADMISSION:  09/01/2008  DATE OF DISCHARGE:  09/28/2009LH                               DISCHARGE SUMMARY   DISCHARGE DIAGNOSES:  1. Atypical chest pain unlikely cardiac in origin.  2. Insulin dependent diabetes mellitus.  3. Hypertension.  4. Dyslipidemia.  5. Morbid obesity.  6. Gastroesophageal reflux disease.   DISCHARGE MEDICATIONS:  1. Lopressor 25 mg p.o. twice daily.  2. Benicar 40 mg p.o. daily.  3. Lexapro 10 mg p.o. daily.  4. Ativan 0.5 mg p.o. 3 times daily.  5. Lasix 40 mg p.o. daily.  6. Lipitor 40 mg p.o. once a day.  7. Nexium 40 mg once a day.  8. Allegra 180 mg p.o. once a day.  9. Spironolactone 25 mg p.o. b.i.d.  10.Advair Diskus twice a day.  11.Metolazone 5 mg p.o. daily.  12.Humulin N 90 units in the morning, 85 at night.  13.Humulin R 50 in the morning and 75 at lunch and 50 with supper.   CONSULTATIONS OBTAINED DURING HOSPITALIZATION:  Cardiology consult with  Gerrit Friends. Dietrich Pates, MD, [facility] for chest pain.   DISPOSITION:  The patient was discharged home.  She was to followup with  Cardiology for outpatient stress testing.   DIET:  Heart healthy 1800 ADA diabetic diet.   ACTIVITY:  As tolerated.   PERTINENT LABORATORY DATA DURING THE COURSE OF HOSPITALIZATION:  Blood  glucose was 209, lipase was negative.  Cardiac enzymes were negative  throughout the course of hospitalization.  Chest x-ray was normal  cardiac size and no active cardiopulmonary disease.   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   A 12-leak EKG shows normal sinus rhythm with normal axis with no Q-waves  seen, no acute ST-T wave changes.   HOSPITAL COURSE:  The patient is a 50 year old female with past  medical  history of insulin dependent diabetes who is obese, also has  dyslipidemia and reflux disease.  The patient presented to the emergency  room after returning from church with chest pain and the retrosternal  area.  She described the pain as sharp pain.  The patient also had some  palpitations but she had no shortness of breath, no cough, fevers or  chills. She had apparently had two episodes emesis.  The pain lasted 3  hours and then resolved on its own without any medications used.  The  pain did not go to her shoulder.  She denied being diaphoretic other  than vomiting.   The patient was subsequently admitted to the hospital.  Vital signs were  stable.  The blood pressure was 136/62, heart rate was 84, respiratory  rate 20, temperature was 98.2 degrees Fahrenheit.  Oxygen saturation was  100% on room air.   The patient was monitored on telemetry overnight.  The patient did  fairly well with no more episodes of chest pain during the course of  hospitalization.  She was seen in the  morning by Cardiology, who felt  that her chest pain was atypical.  The patient was subsequently  discharged to go home to follow up with Cardiology for outpatient stress  test.      Margaretmary Dys, M.D.  Electronically Signed     AM/MEDQ  D:  10/21/2008  T:  10/21/2008  Job:  [id number]

## 2011-08-20 ENCOUNTER — Emergency Department (HOSPITAL_COMMUNITY): Payer: PRIVATE HEALTH INSURANCE

## 2011-08-20 ENCOUNTER — Encounter: Payer: Self-pay | Admitting: Emergency Medicine

## 2011-08-20 ENCOUNTER — Emergency Department (HOSPITAL_COMMUNITY)
Admission: EM | Admit: 2011-08-20 | Discharge: 2011-08-20 | Disposition: A | Discharge status: Home / Self Care | Payer: PRIVATE HEALTH INSURANCE | Attending: Emergency Medicine | Admitting: Emergency Medicine

## 2011-08-20 ENCOUNTER — Other Ambulatory Visit: Payer: Self-pay

## 2011-08-20 DIAGNOSIS — IMO0001 Reserved for inherently not codable concepts without codable children: Secondary | ICD-10-CM

## 2011-08-20 DIAGNOSIS — E119 Type 2 diabetes mellitus without complications: Secondary | ICD-10-CM | POA: Insufficient documentation

## 2011-08-20 DIAGNOSIS — I10 Essential (primary) hypertension: Secondary | ICD-10-CM | POA: Insufficient documentation

## 2011-08-20 DIAGNOSIS — R609 Edema, unspecified: Secondary | ICD-10-CM | POA: Insufficient documentation

## 2011-08-20 DIAGNOSIS — R0602 Shortness of breath: Secondary | ICD-10-CM | POA: Insufficient documentation

## 2011-08-20 DIAGNOSIS — Z794 Long term (current) use of insulin: Secondary | ICD-10-CM | POA: Insufficient documentation

## 2011-08-20 HISTORY — DX: Major depressive disorder, single episode, unspecified: F32.9

## 2011-08-20 HISTORY — DX: Depression, unspecified: F32.A

## 2011-08-20 HISTORY — DX: Essential (primary) hypertension: I10

## 2011-08-20 LAB — URINE MICROSCOPIC-ADD ON

## 2011-08-20 LAB — BASIC METABOLIC PANEL
BUN: 11 mg/dL (ref 6–23)
CO2: 33 mEq/L — ABNORMAL HIGH (ref 19–32)
Calcium: 8.8 mg/dL (ref 8.4–10.5)
Chloride: 95 mEq/L — ABNORMAL LOW (ref 96–112)
Creatinine, Ser: 0.91 mg/dL (ref 0.50–1.10)
GFR calc Af Amer: >60 mL/min (ref >60)
GFR calc non Af Amer: >60 mL/min (ref >60)
Glucose, Bld: 223 mg/dL — ABNORMAL HIGH (ref 70–99)
Potassium: 3.2 mEq/L — ABNORMAL LOW (ref 3.5–5.1)
Sodium: 138 mEq/L (ref 135–145)

## 2011-08-20 LAB — CBC
HCT: 44.9 % (ref 36.0–46.0)
Hemoglobin: 15.3 g/dL — ABNORMAL HIGH (ref 12.0–15.0)
MCH: 32.3 pg (ref 26.0–34.0)
MCHC: 34.1 g/dL (ref 30.0–36.0)
MCV: 94.9 fL (ref 78.0–100.0)
Platelets: 328 10*3/uL (ref 150–400)
RBC: 4.73 MIL/uL (ref 3.87–5.11)
RDW: 14.6 % (ref 11.5–15.5)
WBC: 9.2 10*3/uL (ref 4.0–10.5)

## 2011-08-20 LAB — DIFFERENTIAL
Basophils Absolute: 0 10*3/uL (ref 0.0–0.1)
Basophils Relative: 0 % (ref 0–1)
Eosinophils Absolute: 0.2 10*3/uL (ref 0.0–0.7)
Eosinophils Relative: 2 % (ref 0–5)
Lymphocytes Relative: 23 % (ref 12–46)
Lymphs Abs: 2.2 10*3/uL (ref 0.7–4.0)
Monocytes Absolute: 0.4 10*3/uL (ref 0.1–1.0)
Monocytes Relative: 4 % (ref 3–12)
Neutro Abs: 6.4 10*3/uL (ref 1.7–7.7)
Neutrophils Relative %: 70 % (ref 43–77)

## 2011-08-20 LAB — URINALYSIS, ROUTINE W REFLEX MICROSCOPIC
Bilirubin Urine: NEGATIVE
Glucose, UA: >1000 mg/dL — AB
Hgb urine dipstick: NEGATIVE
Ketones, ur: NEGATIVE mg/dL
Leukocytes, UA: NEGATIVE
Nitrite: NEGATIVE
Protein, ur: NEGATIVE mg/dL
Specific Gravity, Urine: 1.01 (ref 1.005–1.030)
Urobilinogen, UA: 0.2 mg/dL (ref 0.0–1.0)
pH: 7.5 (ref 5.0–8.0)

## 2011-08-20 LAB — CARDIAC PANEL(CRET KIN+CKTOT+MB+TROPI)
CK, MB: 1.9 ng/mL (ref 0.3–4.0)
Relative Index: 1.7 (ref 0.0–2.5)
Total CK: 109 U/L (ref 7–177)
Troponin I: <0.3 ng/mL (ref <0.30)

## 2011-08-20 LAB — D-DIMER, QUANTITATIVE: D-Dimer, Quant: 0.47 ug/mL-FEU (ref 0.00–0.48)

## 2011-08-20 LAB — PRO B NATRIURETIC PEPTIDE: Pro B Natriuretic peptide (BNP): 79.2 pg/mL (ref 0–125)

## 2011-08-20 IMAGING — CR DG CHEST 2V
2 series · 2 of 2 positions shown · non-contrast
Comparison: [DATE]

CLINICAL DATA: Ankle swelling.

CHEST - 2 VIEW

[view not recorded (1 of 2)]
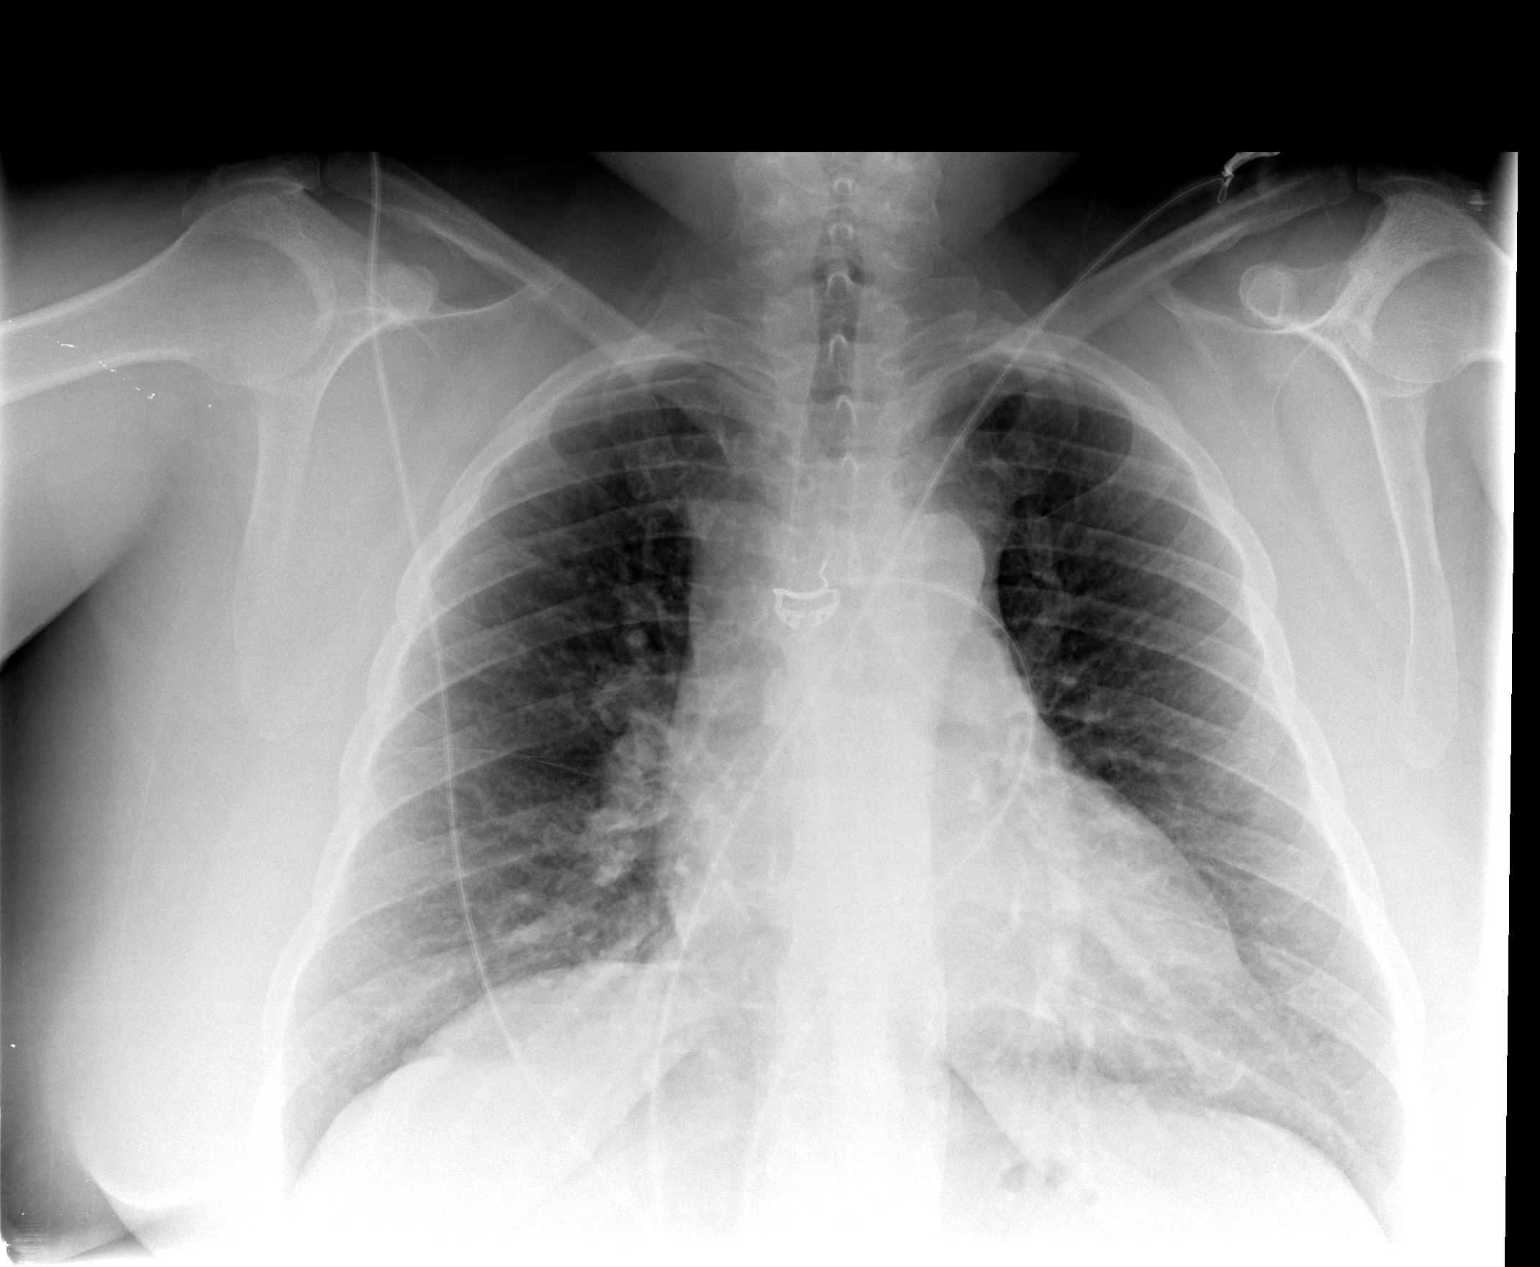

[view not recorded (2 of 2)]
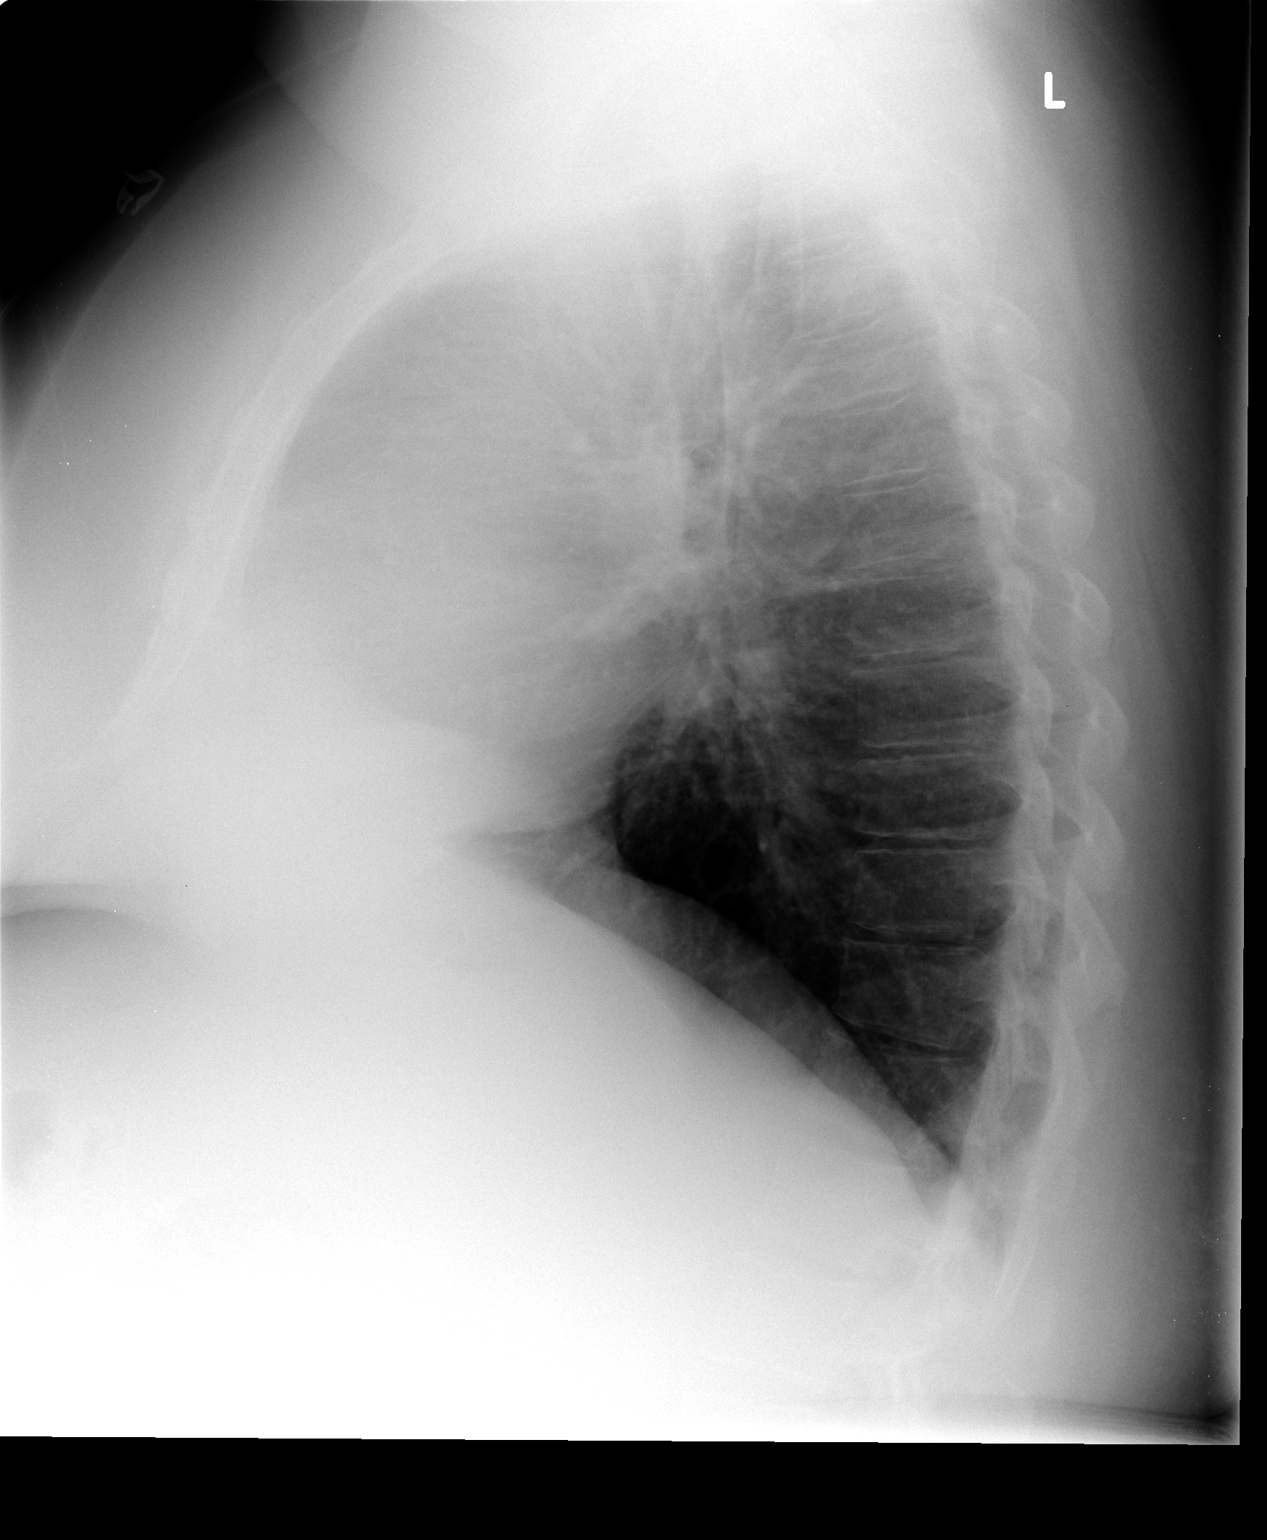

[2 of 2 positions shown; findings below may reference images not displayed]

FINDINGS: The lungs are clear without focal infiltrate, edema,
pneumothorax or pleural effusion. Cardiopericardial silhouette is
at upper limits of normal for size. There is pulmonary vascular
congestion without overt pulmonary edema. Imaged bony structures of
the thorax are intact. Telemetry leads overlie the chest.
IMPRESSION: Borderline cardiomegaly with mild vascular congestion.  No overt
edema or focal pneumonia.

## 2011-08-20 NOTE — ED Notes (Signed)
Pt left er stating no needs 

## 2011-08-20 NOTE — ED Notes (Addendum)
Patient c/o fluid retention with shortness of breath and hypertension. Patient also reports having frequent and painful urination. Patient also c/o mid back pain with walking.

## 2011-08-20 NOTE — ED Provider Notes (Signed)
Scribed for Teresa Hutching, MD, the patient was seen in room [facility] . This chart was scribed by Ellie Lunch. This patient's care was started at 3:27 PM.   CSN: [id number] Arrival date & time: 08/20/2011  1:45 PM   Chief Complaint  Patient presents with  . Shortness of Breath     (Include location/radiation/quality/duration/timing/severity/associated sxs/prior treatment) Patient is a 50 y.o. female presenting with shortness of breath.  Shortness of Breath  Associated symptoms include shortness of breath.   SIANNI [name] is a 50 y.o. female who presents to the Emergency Department complaining of SOB with associated fluid retention and hypertension. SOB is aggravated on exertion. Pt denies history of CHF. PT reports some improvement of fluid retention with diuretics.   PCP Dr. Denzil Magnuson  HPI ELEMENTS:   Onset: More than 2 days ago  Timing: constant  Modifying factors: aggravated by exertion  Context:as above  Associated symptoms: fluid retention and hypertension   Past Medical History  Diagnosis Date  . Diabetes mellitus   . Hypertension   . Asthma   . Depression      Past Surgical History  Procedure Date  . Cesarean section   . Cholecystectomy   . Cataract extraction     Family History  Problem Relation Age of Onset  . Stroke Mother   . Diabetes Father   . Heart failure Father   . Hypertension Father   . Diabetes Sister   . Heart failure Sister   . Hypertension Sister   . Cancer Other     History  Substance Use Topics  . Smoking status: Never Smoker   . Smokeless tobacco: Never Used  . Alcohol Use: No  Works at [employer]. Accompanies to ED with sister.   Review of Systems  Respiratory: Positive for shortness of breath.   10 Systems reviewed and are negative for acute change except as noted in the HPI.  Allergies  Codeine and Sulfa antibiotics  Home Medications   Current Outpatient Rx  Name Route Sig Dispense Refill  . ACETAMINOPHEN 500 MG  PO TABS Oral Take 1,000 mg by mouth every 6 (six) hours as needed. Pain     . FUROSEMIDE 40 MG PO TABS Oral Take 40 mg by mouth 2 (two) times daily.      . INSULIN ISOPHANE HUMAN 100 UNIT/ML [place] SUSP Subcutaneous Inject 80-100 Units into the skin 2 (two) times daily. Patient injects 100 units in the morning and 80 units at night.     . INSULIN REGULAR HUMAN 100 UNIT/ML IJ SOLN Subcutaneous Inject 30-50 Units into the skin 3 (three) times daily. Patient injects 50 units in the morning then injects between 30 to 50 units at lunch depending on levels, and at night she injects 50 units.     Marland Kitchen METOPROLOL TARTRATE 50 MG PO TABS Oral Take 50 mg by mouth 2 (two) times daily.      Marland Kitchen POTASSIUM CHLORIDE CRYS CR 20 MEQ PO TBCR Oral Take 20 mEq by mouth 3 (three) times daily.        Physical Exam    BP 135/58  Pulse 75  Temp(Src) 98.7 F (37.1 C) (Oral)  Resp 20  Ht 5\' 1"  (1.549 m)  Wt 288 lb (130.636 kg)  BMI 54.42 kg/m2  SpO2 96%  LMP 06/13/2011  Physical Exam  Nursing note and vitals reviewed. Constitutional: She is oriented to person, place, and time. She appears well-developed and well-nourished.       Appears  obese   HENT:  Head: Normocephalic and atraumatic.  Eyes: Conjunctivae and EOM are normal. Pupils are equal, round, and reactive to light.  Neck: Normal range of motion. Neck supple.  Cardiovascular: Normal rate, regular rhythm and normal heart sounds.   Pulmonary/Chest: Effort normal and breath sounds normal.  Abdominal: Soft. Bowel sounds are normal.  Musculoskeletal: Normal range of motion. She exhibits edema (2+ pitting edema lower extremities).  Neurological: She is alert and oriented to person, place, and time.  Skin: Skin is warm and dry.  Psychiatric: She has a normal mood and affect. Judgment normal.   Procedures OTHER DATA REVIEWED: Nursing notes, vital signs, and past medical records reviewed.  DIAGNOSTIC STUDIES: Oxygen Saturation is 96% on room air, normal by my  interpretation.    Date: 08/20/2011  Rate:73  Rhythm: normal sinus rhythm  QRS Axis: normal  Intervals: normal  ST/T Wave abnormalities: normal  Conduction Disutrbances:none  Narrative Interpretation:   Old EKG Reviewed: none available Prolonged QT LABS / RADIOLOGY:  Results for orders placed during the hospital encounter of 08/20/11  CBC      Component Value Range   WBC 9.2  4.0 - 10.5 (K/uL)   RBC 4.73  3.87 - 5.11 (MIL/uL)   Hemoglobin 15.3 (*) 12.0 - 15.0 (g/dL)   HCT 16.1  09.6 - 04.5 (%)   MCV 94.9  78.0 - 100.0 (fL)   MCH 32.3  26.0 - 34.0 (pg)   MCHC 34.1  30.0 - 36.0 (g/dL)   RDW 40.9  81.1 - 91.4 (%)   Platelets 328  150 - 400 (K/uL)  DIFFERENTIAL      Component Value Range   Neutrophils Relative 70  43 - 77 (%)   Neutro Abs 6.4  1.7 - 7.7 (K/uL)   Lymphocytes Relative 23  12 - 46 (%)   Lymphs Abs 2.2  0.7 - 4.0 (K/uL)   Monocytes Relative 4  3 - 12 (%)   Monocytes Absolute 0.4  0.1 - 1.0 (K/uL)   Eosinophils Relative 2  0 - 5 (%)   Eosinophils Absolute 0.2  0.0 - 0.7 (K/uL)   Basophils Relative 0  0 - 1 (%)   Basophils Absolute 0.0  0.0 - 0.1 (K/uL)  BASIC METABOLIC PANEL      Component Value Range   Sodium 138  135 - 145 (mEq/L)   Potassium 3.2 (*) 3.5 - 5.1 (mEq/L)   Chloride 95 (*) 96 - 112 (mEq/L)   CO2 33 (*) 19 - 32 (mEq/L)   Glucose, Bld 223 (*) 70 - 99 (mg/dL)   BUN 11  6 - 23 (mg/dL)   Creatinine, Ser 7.82  0.50 - 1.10 (mg/dL)   Calcium 8.8  8.4 - 95.6 (mg/dL)   GFR calc non Af Amer >60  >60 (mL/min)   GFR calc Af Amer >60  >60 (mL/min)  URINALYSIS, ROUTINE W REFLEX MICROSCOPIC      Component Value Range   Color, Urine YELLOW  YELLOW    Appearance CLEAR  CLEAR    Specific Gravity, Urine 1.010  1.005 - 1.030    pH 7.5  5.0 - 8.0    Glucose, UA >1000 (*) NEGATIVE (mg/dL)   Hgb urine dipstick NEGATIVE  NEGATIVE    Bilirubin Urine NEGATIVE  NEGATIVE    Ketones, ur NEGATIVE  NEGATIVE (mg/dL)   Protein, ur NEGATIVE  NEGATIVE (mg/dL)    Urobilinogen, UA 0.2  0.0 - 1.0 (mg/dL)   Nitrite NEGATIVE  NEGATIVE  Leukocytes, UA NEGATIVE  NEGATIVE   PRO B NATRIURETIC PEPTIDE      Component Value Range   BNP, POC 79.2  0 - 125 (pg/mL)  URINE MICROSCOPIC-ADD ON      Component Value Range   Squamous Epithelial / LPF RARE  RARE    WBC, UA 0-2  <3 (WBC/hpf)   RBC / HPF 0-2  <3 (RBC/hpf)   Bacteria, UA RARE  RARE   CARDIAC PANEL(CRET KIN+CKTOT+MB+TROPI)      Component Value Range   Total CK 109  7 - 177 (U/L)   CK, MB 1.9  0.3 - 4.0 (ng/mL)   Troponin I <0.30  <0.30 (ng/mL)   Relative Index 1.7  0.0 - 2.5   D-DIMER, QUANTITATIVE      Component Value Range   D-Dimer, Quant 0.47  0.00 - 0.48 (ug/mL-FEU)   Dg Chest 2 View  08/20/2011  *RADIOLOGY REPORT*  Clinical Data: Ankle swelling.  CHEST - 2 VIEW  Comparison: 06/15/2007  Findings: The lungs are clear without focal infiltrate, edema, pneumothorax or pleural effusion. Cardiopericardial silhouette is at upper limits of normal for size. There is pulmonary vascular congestion without overt pulmonary edema. Imaged bony structures of the thorax are intact. Telemetry leads overlie the chest.  IMPRESSION: Borderline cardiomegaly with mild vascular congestion.  No overt edema or focal pneumonia.  Original Report Authenticated By: ERIC A. MANSELL, M.D.   ED COURSE / COORDINATION OF CARE: 15:32: Discussed diagnostic possibilities with Pt including CHF. Ordered the following: Orders Placed This Encounter  Procedures  . DG Chest 2 View  . CBC  . Differential  . Basic metabolic panel  . Urinalysis with microscopic  . Pro b natriuretic peptide  . Urine microscopic-add on   MDM: Patient is morbidly obese. Planes of increasing fluid retention in her lower extremities and shortness of breath. She has hypertension type 1 diabetes. Patient is not dyspneic on exam. Minimal to moderate lower extremity edema. The remainder of diagnostic workup unremarkable. Will increase Lasix from 40 mg twice a  day to 60 mg twice a day  IMPRESSION: Diagnoses that have been ruled out:  Diagnoses that are still under consideration:  Final diagnoses:    SCRIBE ATTESTATION: I personally performed the services described in this documentation, which was scribed in my presence. The recorded information has been reviewed and considered. Teresa Hutching, MD           Teresa Hutching, MD 08/20/11 1910

## 2011-09-06 LAB — CBC
HCT: 34.6 — ABNORMAL LOW
Hemoglobin: 11.5 — ABNORMAL LOW
MCHC: 33.1
MCV: 76.6 — ABNORMAL LOW
Platelets: 384
RBC: 4.52
RDW: 18.5 — ABNORMAL HIGH
WBC: 9.4

## 2011-09-06 LAB — CARDIAC PANEL(CRET KIN+CKTOT+MB+TROPI)
CK, MB: 0.8
CK, MB: 0.8
Relative Index: INVALID
Relative Index: INVALID
Total CK: 50
Total CK: 54
Troponin I: 0.01
Troponin I: 0.01

## 2011-09-06 LAB — POCT CARDIAC MARKERS
CKMB, poc: <1 — ABNORMAL LOW
CKMB, poc: <1 — ABNORMAL LOW
Myoglobin, poc: 66.8
Myoglobin, poc: 80
Troponin i, poc: <0.05
Troponin i, poc: <0.05

## 2011-09-06 LAB — HEPATIC FUNCTION PANEL
ALT: 25
AST: 21
Albumin: 3.1 — ABNORMAL LOW
Alkaline Phosphatase: 79
Bilirubin, Direct: 0.1
Indirect Bilirubin: 0.3
Total Bilirubin: 0.4
Total Protein: 6.7

## 2011-09-06 LAB — DIFFERENTIAL
Basophils Absolute: 0
Basophils Relative: 0
Eosinophils Absolute: 0.3
Eosinophils Relative: 3
Lymphocytes Relative: 27
Lymphs Abs: 2.6
Monocytes Absolute: 0.6
Monocytes Relative: 6
Neutro Abs: 5.9
Neutrophils Relative %: 63

## 2011-09-06 LAB — HEMOGLOBIN A1C
Hgb A1c MFr Bld: 10 — ABNORMAL HIGH
Mean Plasma Glucose: 240

## 2011-09-06 LAB — BASIC METABOLIC PANEL
BUN: 17
CO2: 32
Calcium: 9.2
Chloride: 94 — ABNORMAL LOW
Creatinine, Ser: 1.13
GFR calc Af Amer: >60
GFR calc non Af Amer: 52 — ABNORMAL LOW
Glucose, Bld: 209 — ABNORMAL HIGH
Potassium: 3.9
Sodium: 136

## 2011-09-06 LAB — GLUCOSE, CAPILLARY
Glucose-Capillary: 235 — ABNORMAL HIGH
Glucose-Capillary: 239 — ABNORMAL HIGH
Glucose-Capillary: 279 — ABNORMAL HIGH

## 2011-09-06 LAB — LIPASE, BLOOD: Lipase: 17

## 2011-09-06 LAB — D-DIMER, QUANTITATIVE (NOT AT ARMC): D-Dimer, Quant: 0.65 — ABNORMAL HIGH

## 2011-09-21 LAB — POCT CARDIAC MARKERS
CKMB, poc: 1.5
Myoglobin, poc: 133
Operator id: 189501
Troponin i, poc: <0.05

## 2011-09-21 LAB — I-STAT 8, (EC8 V) (CONVERTED LAB)
Acid-Base Excess: 4 — ABNORMAL HIGH
BUN: 10
Bicarbonate: 27.5 — ABNORMAL HIGH
Chloride: 104
Glucose, Bld: 149 — ABNORMAL HIGH
HCT: 39
Hemoglobin: 13.3
Operator id: 189501
Potassium: 3.9
Sodium: 137
TCO2: 29
pCO2, Ven: 37.8 — ABNORMAL LOW
pH, Ven: 7.471 — ABNORMAL HIGH

## 2011-09-21 LAB — B-NATRIURETIC PEPTIDE (CONVERTED LAB): Pro B Natriuretic peptide (BNP): <30

## 2011-09-21 LAB — D-DIMER, QUANTITATIVE: D-Dimer, Quant: 0.24

## 2011-09-21 LAB — TSH: TSH: 3.765

## 2012-02-07 ENCOUNTER — Other Ambulatory Visit (HOSPITAL_COMMUNITY): Payer: Self-pay | Admitting: Urology

## 2012-02-07 DIAGNOSIS — N390 Urinary tract infection, site not specified: Secondary | ICD-10-CM

## 2012-02-10 ENCOUNTER — Ambulatory Visit (HOSPITAL_COMMUNITY)
Admission: RE | Admit: 2012-02-10 | Discharge: 2012-02-10 | Disposition: A | Discharge status: Home / Self Care | Payer: PRIVATE HEALTH INSURANCE | Source: Ambulatory Visit | Attending: Urology | Admitting: Urology

## 2012-02-10 DIAGNOSIS — K449 Diaphragmatic hernia without obstruction or gangrene: Secondary | ICD-10-CM | POA: Insufficient documentation

## 2012-02-10 DIAGNOSIS — N949 Unspecified condition associated with female genital organs and menstrual cycle: Secondary | ICD-10-CM | POA: Insufficient documentation

## 2012-02-10 DIAGNOSIS — K7689 Other specified diseases of liver: Secondary | ICD-10-CM | POA: Insufficient documentation

## 2012-02-10 DIAGNOSIS — E119 Type 2 diabetes mellitus without complications: Secondary | ICD-10-CM | POA: Insufficient documentation

## 2012-02-10 DIAGNOSIS — N390 Urinary tract infection, site not specified: Secondary | ICD-10-CM | POA: Insufficient documentation

## 2012-02-10 DIAGNOSIS — R160 Hepatomegaly, not elsewhere classified: Secondary | ICD-10-CM | POA: Insufficient documentation

## 2012-02-10 IMAGING — CT CT ABD-PEL WO/W CM
2 of 9 series · 11 of 46 positions shown, 18 images · IV contrast (Omnipaque 300)
Comparison: Chest CT [DATE], [DATE].

CLINICAL DATA: Urinary tract infection.  Vaginal pain.  Diabetes.
History of gallbladder surgery and tubal ligation.

CT ABDOMEN AND PELVIS WITHOUT AND WITH CONTRAST
TECHNIQUE: Multidetector CT imaging of the abdomen and pelvis was
performed without contrast material in one or both body regions,
followed by contrast material(s) and further sections in one or
both body regions.
Contrast: 125mL OMNIPAQUE IOHEXOL 300 MG/ML IJ SOLN

[Series 3: hematuria mpr coro pre (id) · coronal · non-contrast · 0.89mm/px · 2 of 109 slices shown, 3 images]
[im 37/109  soft-tissue]
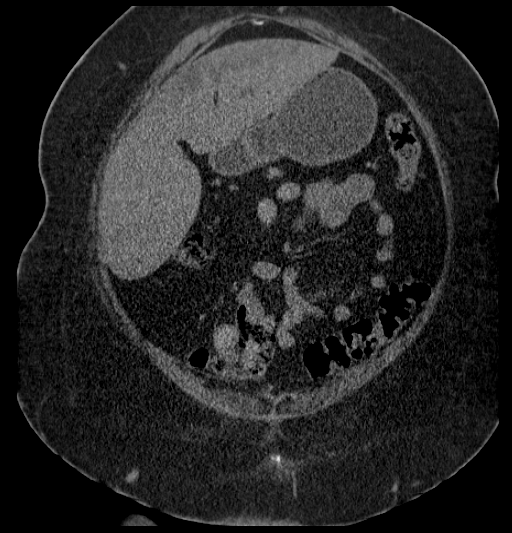
[im 37/109  bone]
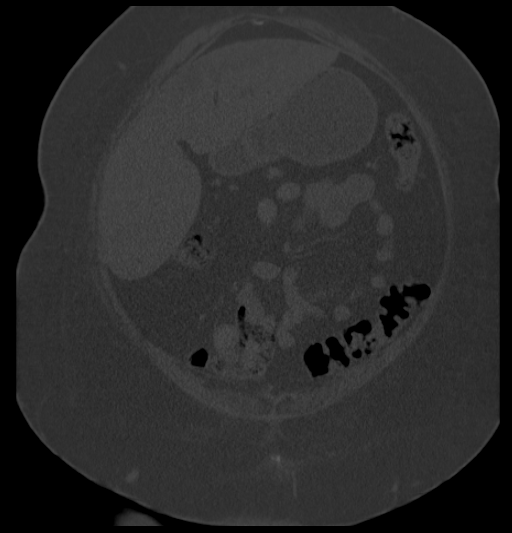
[im 73/109  soft-tissue]
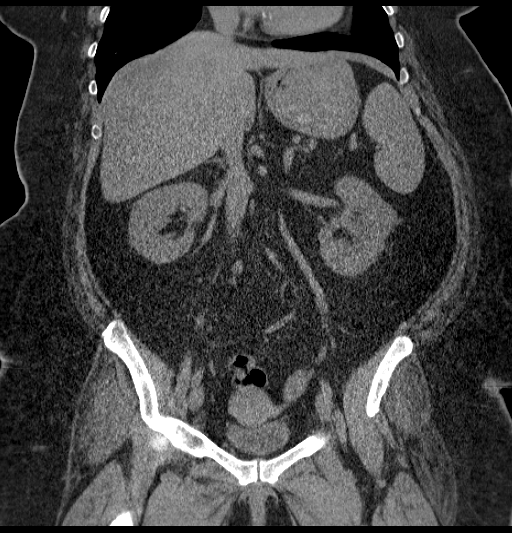

[Series 6: hematuria post contrast (id) · axial · 0.94mm/px · z∈[-478,-113]mm · 9 of 93 slices shown, 15 images]
[im 10/93  soft-tissue]
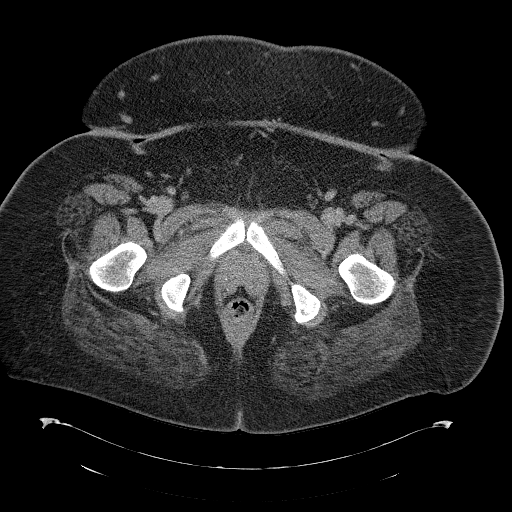
[im 10/93  bone]
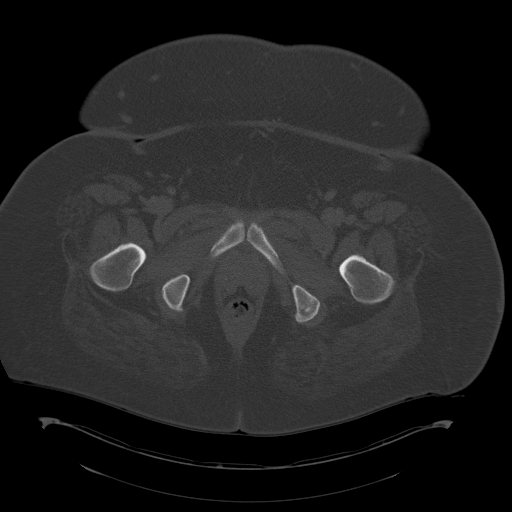
[im 19/93  soft-tissue]
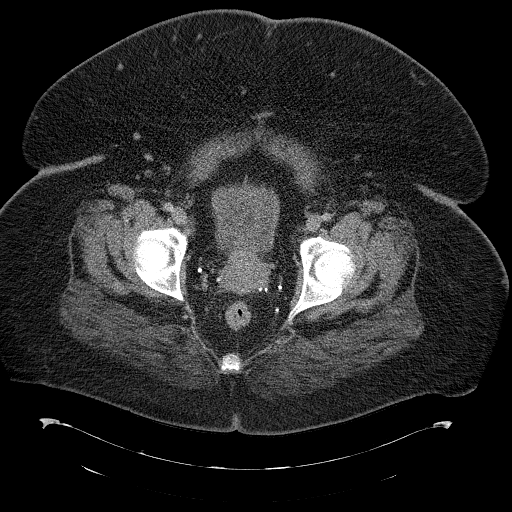
[im 28/93  soft-tissue]
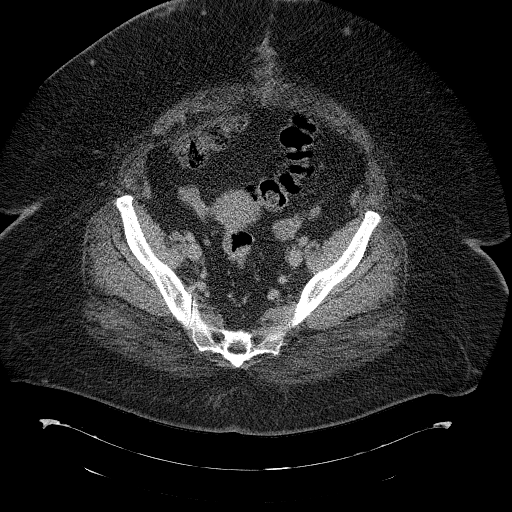
[im 37/93  soft-tissue]
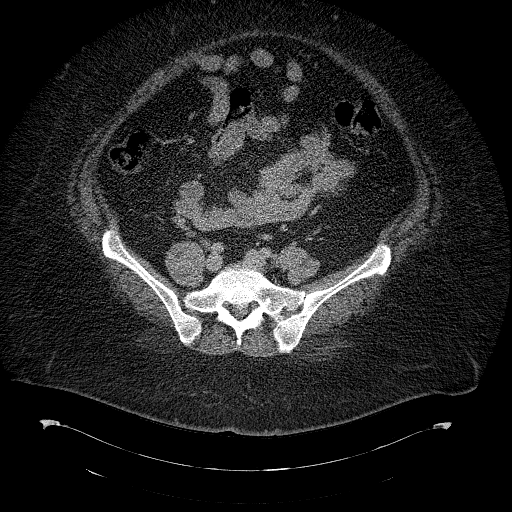
[im 47/93  soft-tissue]
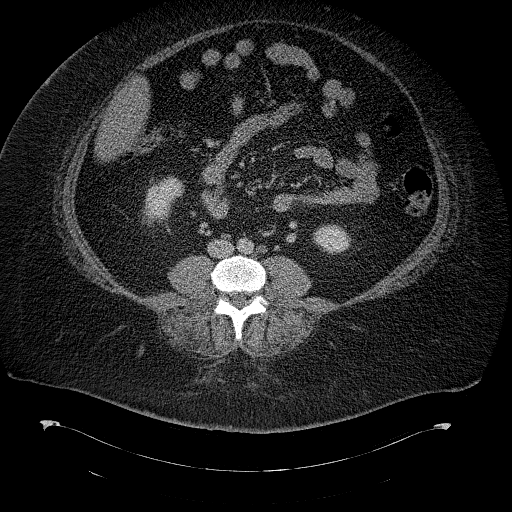
[im 56/93  soft-tissue]
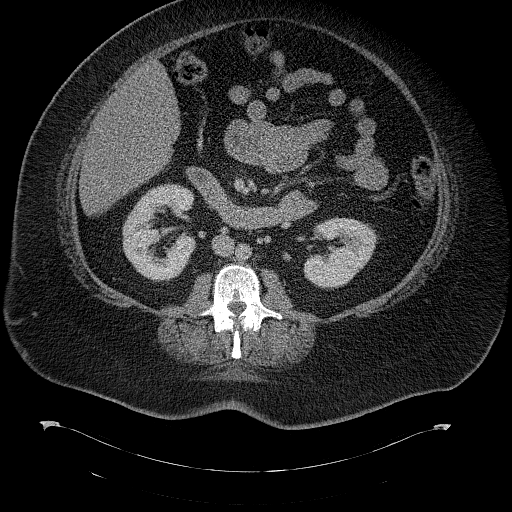
[im 56/93  lung]
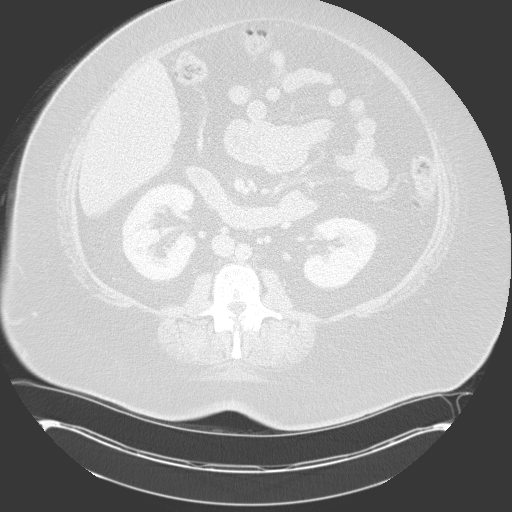
[im 65/93  soft-tissue]
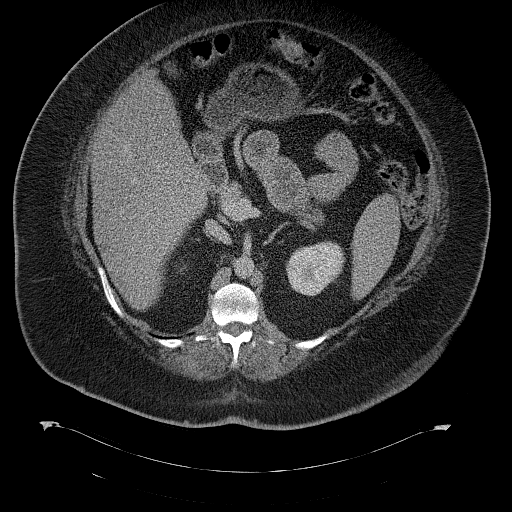
[im 65/93  lung]
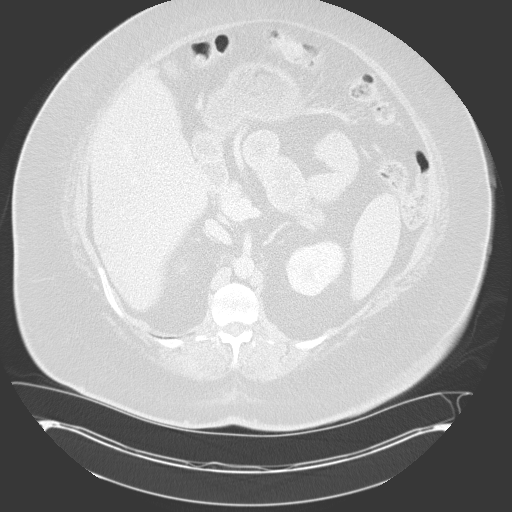
[im 74/93  soft-tissue]
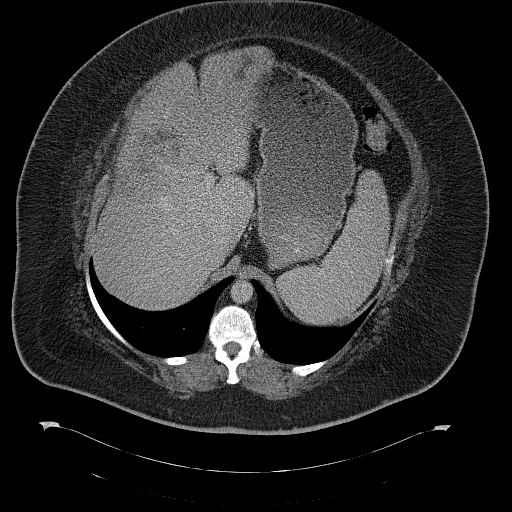
[im 74/93  lung]
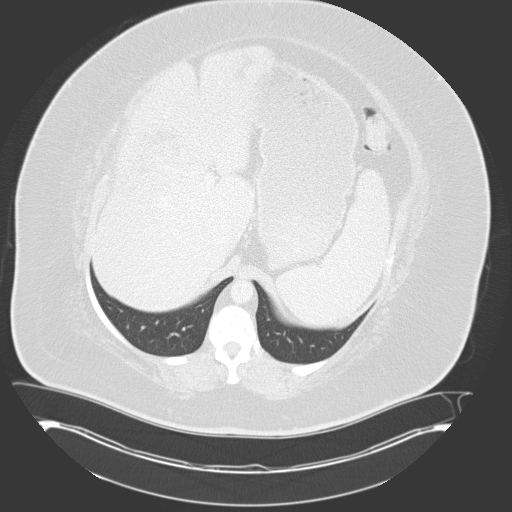
[im 83/93  soft-tissue]
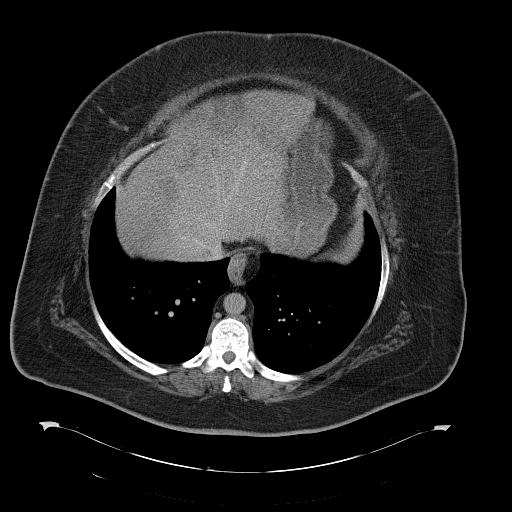
[im 83/93  lung]
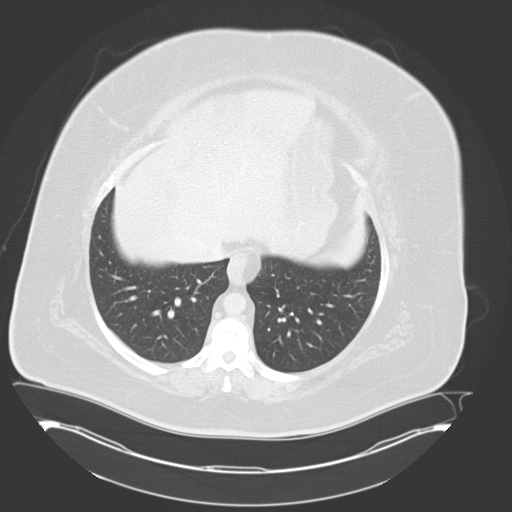
[im 83/93  bone]
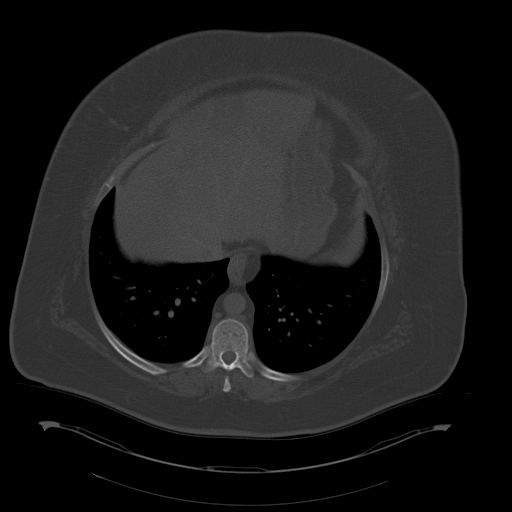

[11 of 46 positions shown; findings below may reference images not displayed]

FINDINGS: Coronary artery atherosclerosis is present. If office
based assessment of coronary risk factors has not been performed,
it is now recommended.  Small hiatal hernia is present with
patulous gastroesophageal junction.

Noncontrast imaging demonstrates geographic low attenuation in the
liver involving both right and left hepatic lobe.  Left hepatic
lobe geographic low attenuation was present on the prior
examination of [S1]. Hepatomegaly is present with enlargement of a
Riedel's lobe. There is no disruption of the vessels or capsular
contour abnormalities and the appearance is compatible with
geographic fatty infiltration.  Spleen appears within normal
limits.

There are no renal calculi.  There is a simple right lower polar
renal cyst measuring 23 mm.  The adrenal glands appear normal.
Normal renal enhancement.

Cholecystectomy.  No calcified gallstones.  Common bile duct
appears normal.  Relative atrophy of the pancreas.  Renal
enhancement is within normal limits.  Single renal arteries are
present bilaterally.  Mild abdominal aortic atherosclerosis without
aneurysm.  No free fluid.  Uterus and adnexa appear within normal
limits.  The colon appears within normal limits.  No inguinal
adenopathy.  No aggressive osseous lesions.  Thoracolumbar
spondylosis is mild.

Delayed renal excretion images show no filling defects in the
urinary bladder.  The patient was unable to tolerate supine
positioning.  There are no filling defects in the calyces.
Suboptimal opacification of the right ureter with the middle third
of the ureter and distal third of the ureter not visualized.  No
obstruction.

In the anterior intra-abdominal fat, there is a small calcified
nodule likely representing a tiny mesenteric infarct (image 51
series 11).
IMPRESSION: 1.  No cause for hematuria identified.  The patient was unable to
tolerate prone positioning and the middle and distal third of the
right ureter is not opacified.  No filling defects identified in
the dependent urinary bladder. Simple left lower polar renal cyst.
2.  Cholecystectomy.
3.  Atherosclerosis and coronary artery disease.
4. Hepatomegaly and geographic fatty infiltration of the liver.
5.  Small hiatal hernia and patulous gastroesophageal junction.

## 2012-02-10 MED ORDER — IOHEXOL 300 MG/ML  SOLN
125.0000 mL | Freq: Once | INTRAMUSCULAR | Status: AC | PRN
  Administered 2012-02-10: 125 mL via INTRAVENOUS

## 2013-04-30 ENCOUNTER — Emergency Department (HOSPITAL_COMMUNITY)
Admission: EM | Admit: 2013-04-30 | Discharge: 2013-04-30 | Disposition: A | Discharge status: Home / Self Care | Payer: PRIVATE HEALTH INSURANCE | Attending: Emergency Medicine | Admitting: Emergency Medicine

## 2013-04-30 ENCOUNTER — Encounter (HOSPITAL_COMMUNITY): Payer: Self-pay

## 2013-04-30 DIAGNOSIS — E119 Type 2 diabetes mellitus without complications: Secondary | ICD-10-CM | POA: Insufficient documentation

## 2013-04-30 DIAGNOSIS — Z794 Long term (current) use of insulin: Secondary | ICD-10-CM | POA: Insufficient documentation

## 2013-04-30 DIAGNOSIS — F329 Major depressive disorder, single episode, unspecified: Secondary | ICD-10-CM | POA: Insufficient documentation

## 2013-04-30 DIAGNOSIS — I10 Essential (primary) hypertension: Secondary | ICD-10-CM | POA: Insufficient documentation

## 2013-04-30 DIAGNOSIS — G609 Hereditary and idiopathic neuropathy, unspecified: Secondary | ICD-10-CM | POA: Insufficient documentation

## 2013-04-30 DIAGNOSIS — Z7982 Long term (current) use of aspirin: Secondary | ICD-10-CM | POA: Insufficient documentation

## 2013-04-30 DIAGNOSIS — R51 Headache: Secondary | ICD-10-CM | POA: Insufficient documentation

## 2013-04-30 DIAGNOSIS — R110 Nausea: Secondary | ICD-10-CM | POA: Insufficient documentation

## 2013-04-30 DIAGNOSIS — E876 Hypokalemia: Secondary | ICD-10-CM

## 2013-04-30 DIAGNOSIS — G629 Polyneuropathy, unspecified: Secondary | ICD-10-CM

## 2013-04-30 DIAGNOSIS — Z79899 Other long term (current) drug therapy: Secondary | ICD-10-CM | POA: Insufficient documentation

## 2013-04-30 DIAGNOSIS — L03119 Cellulitis of unspecified part of limb: Secondary | ICD-10-CM | POA: Insufficient documentation

## 2013-04-30 DIAGNOSIS — L0390 Cellulitis, unspecified: Secondary | ICD-10-CM

## 2013-04-30 DIAGNOSIS — R300 Dysuria: Secondary | ICD-10-CM | POA: Insufficient documentation

## 2013-04-30 DIAGNOSIS — L02419 Cutaneous abscess of limb, unspecified: Secondary | ICD-10-CM | POA: Insufficient documentation

## 2013-04-30 DIAGNOSIS — J45909 Unspecified asthma, uncomplicated: Secondary | ICD-10-CM | POA: Insufficient documentation

## 2013-04-30 DIAGNOSIS — F3289 Other specified depressive episodes: Secondary | ICD-10-CM | POA: Insufficient documentation

## 2013-04-30 LAB — URINALYSIS, ROUTINE W REFLEX MICROSCOPIC
Bilirubin Urine: NEGATIVE
Glucose, UA: 250 mg/dL — AB
Ketones, ur: NEGATIVE mg/dL
Leukocytes, UA: NEGATIVE
Nitrite: NEGATIVE
Protein, ur: 100 mg/dL — AB
Specific Gravity, Urine: 1.02 (ref 1.005–1.030)
Urobilinogen, UA: 0.2 mg/dL (ref 0.0–1.0)
pH: 6 (ref 5.0–8.0)

## 2013-04-30 LAB — COMPREHENSIVE METABOLIC PANEL
ALT: 57 U/L — ABNORMAL HIGH (ref 0–35)
AST: 70 U/L — ABNORMAL HIGH (ref 0–37)
Albumin: 3.1 g/dL — ABNORMAL LOW (ref 3.5–5.2)
Alkaline Phosphatase: 257 U/L — ABNORMAL HIGH (ref 39–117)
BUN: 12 mg/dL (ref 6–23)
CO2: 36 mEq/L — ABNORMAL HIGH (ref 19–32)
Calcium: 9.4 mg/dL (ref 8.4–10.5)
Chloride: 87 mEq/L — ABNORMAL LOW (ref 96–112)
Creatinine, Ser: 0.83 mg/dL (ref 0.50–1.10)
GFR calc Af Amer: >90 mL/min (ref >90)
GFR calc non Af Amer: 80 mL/min — ABNORMAL LOW (ref >90)
Glucose, Bld: 188 mg/dL — ABNORMAL HIGH (ref 70–99)
Potassium: 2.7 mEq/L — CL (ref 3.5–5.1)
Sodium: 134 mEq/L — ABNORMAL LOW (ref 135–145)
Total Bilirubin: 0.6 mg/dL (ref 0.3–1.2)
Total Protein: 7.6 g/dL (ref 6.0–8.3)

## 2013-04-30 LAB — CBC WITH DIFFERENTIAL/PLATELET
Basophils Absolute: 0.1 10*3/uL (ref 0.0–0.1)
Basophils Relative: 2 % — ABNORMAL HIGH (ref 0–1)
Eosinophils Absolute: 0.1 10*3/uL (ref 0.0–0.7)
Eosinophils Relative: 1 % (ref 0–5)
HCT: 46 % (ref 36.0–46.0)
Hemoglobin: 15.9 g/dL — ABNORMAL HIGH (ref 12.0–15.0)
Lymphocytes Relative: 18 % (ref 12–46)
Lymphs Abs: 1.5 10*3/uL (ref 0.7–4.0)
MCH: 31.9 pg (ref 26.0–34.0)
MCHC: 34.6 g/dL (ref 30.0–36.0)
MCV: 92.2 fL (ref 78.0–100.0)
Monocytes Absolute: 0.5 10*3/uL (ref 0.1–1.0)
Monocytes Relative: 6 % (ref 3–12)
Neutro Abs: 6.3 10*3/uL (ref 1.7–7.7)
Neutrophils Relative %: 74 % (ref 43–77)
Platelets: 291 10*3/uL (ref 150–400)
RBC: 4.99 MIL/uL (ref 3.87–5.11)
RDW: 14.3 % (ref 11.5–15.5)
WBC: 8.5 10*3/uL (ref 4.0–10.5)

## 2013-04-30 LAB — BASIC METABOLIC PANEL
BUN: 10 mg/dL (ref 6–23)
CO2: 34 mEq/L — ABNORMAL HIGH (ref 19–32)
Calcium: 9 mg/dL (ref 8.4–10.5)
Chloride: 91 mEq/L — ABNORMAL LOW (ref 96–112)
Creatinine, Ser: 0.78 mg/dL (ref 0.50–1.10)
GFR calc Af Amer: >90 mL/min (ref >90)
GFR calc non Af Amer: >90 mL/min (ref >90)
Glucose, Bld: 82 mg/dL (ref 70–99)
Potassium: 2.8 mEq/L — ABNORMAL LOW (ref 3.5–5.1)
Sodium: 135 mEq/L (ref 135–145)

## 2013-04-30 LAB — URINE MICROSCOPIC-ADD ON

## 2013-04-30 MED ORDER — ONDANSETRON 4 MG PO TBDP: ORAL_TABLET | ORAL | Status: DC

## 2013-04-30 MED ORDER — POTASSIUM CHLORIDE CRYS ER 20 MEQ PO TBCR
40.0000 meq | EXTENDED_RELEASE_TABLET | Freq: Once | ORAL | Status: AC
  Administered 2013-04-30: 40 meq via ORAL
  Filled 2013-04-30: qty 2

## 2013-04-30 MED ORDER — ONDANSETRON HCL 4 MG/2ML IJ SOLN
INTRAMUSCULAR | Status: AC
  Administered 2013-04-30: 4 mg via INTRAVENOUS
  Filled 2013-04-30: qty 2

## 2013-04-30 MED ORDER — GABAPENTIN 100 MG PO CAPS: 100.0000 mg | ORAL_CAPSULE | Freq: Two times a day (BID) | ORAL | Status: DC

## 2013-04-30 MED ORDER — ONDANSETRON HCL 4 MG/2ML IJ SOLN
4.0000 mg | Freq: Once | INTRAMUSCULAR | Status: AC
  Administered 2013-04-30: 4 mg via INTRAVENOUS
  Filled 2013-04-30: qty 2

## 2013-04-30 MED ORDER — POTASSIUM CHLORIDE 10 MEQ/100ML IV SOLN
10.0000 meq | Freq: Once | INTRAVENOUS | Status: AC
  Administered 2013-04-30: 10 meq via INTRAVENOUS
  Filled 2013-04-30: qty 100

## 2013-04-30 MED ORDER — IBUPROFEN 400 MG PO TABS
600.0000 mg | ORAL_TABLET | Freq: Once | ORAL | Status: AC
  Administered 2013-04-30: 600 mg via ORAL
  Filled 2013-04-30: qty 2

## 2013-04-30 MED ORDER — SODIUM CHLORIDE 0.9 % IV BOLUS (SEPSIS)
1000.0000 mL | Freq: Once | INTRAVENOUS | Status: AC
  Administered 2013-04-30: 1000 mL via INTRAVENOUS

## 2013-04-30 MED ORDER — CLINDAMYCIN HCL 300 MG PO CAPS: 300.0000 mg | ORAL_CAPSULE | Freq: Four times a day (QID) | ORAL | Status: DC

## 2013-04-30 MED ORDER — ONDANSETRON HCL 4 MG/2ML IJ SOLN
4.0000 mg | Freq: Once | INTRAMUSCULAR | Status: AC
  Administered 2013-04-30: 4 mg via INTRAVENOUS

## 2013-04-30 MED ORDER — LIDOCAINE-EPINEPHRINE (PF) 1 %-1:200000 IJ SOLN
INTRAMUSCULAR | Status: AC
  Administered 2013-04-30: 10 mL
  Filled 2013-04-30: qty 10

## 2013-04-30 NOTE — ED Notes (Signed)
Pt presents with n/v with headache secondary to starting Gabapentin last night per pt. Pt also here for abscess  On rt upper thigh. No drainage noted at wound site. No acute distress noted at this time.

## 2013-04-30 NOTE — ED Notes (Signed)
CRITICAL VALUE ALERT  Critical value received:  K++ 2.7  Date of notification: 04/30/2013  Time of notification:  1911  Critical value read back K++ 2.7  Nurse who received alert: JLR  MD notified (1st page):  Ranae Palms  Time of first page:  1911  Responding MD:  Ranae Palms  Time MD responded:  [phone]

## 2013-04-30 NOTE — ED Notes (Signed)
Pt states continues to be nausea and small amount of emesis noted.

## 2013-04-30 NOTE — ED Notes (Signed)
300mg  Neurontin made her dizzy, nauseated, headache and weak.  R upper medial thigh hard reddened area with white center.

## 2013-04-30 NOTE — ED Notes (Signed)
Pt reports tingling in her arms and legs for the past few days.  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  Pt reports taking neurontin at home but it made her feel bad.  Pt also reports blisters to the back of her left leg.

## 2013-04-30 NOTE — ED Notes (Signed)
Pt c/o headache. EDP aware, new orders received.

## 2013-04-30 NOTE — ED Provider Notes (Signed)
History  This chart was scribed for Teresa Racer, MD by [name], ED Scribe. This patient was seen in room APA16A/APA16A and the patient's care was started at 5:00 PM.  CSN: [id number]  Arrival date & time 04/30/13  1434   First MD Initiated Contact with Patient 04/30/13 1700      Chief Complaint  Patient presents with  . Numbness    The history is provided by the patient. No language interpreter was used.   HPI Comments: [name] is a 52 y.o. female who presents to the Emergency Department complaining of  2 to 3 days of paresthesias described as pins and needles in bilateral hands that radiates up to the elbow and bilateral foot pain that radiates up to the calves that she attributes to neuropathy. She reports that she took 300 mg neurontin with improvement in the paresthesias but developed a persistent HA and nausea after taking her dose last night. She denies having any other medications at home for the neuropathy and denies having a HA or nausea prior to taking the neurontin dose last night. She has a h/o DM and reports that her last CBG was 269 earlier today. She reports dysuria and an abscess to the posterior left knee but denies fevers, chills and frequency as associated symptoms.  Past Medical History  Diagnosis Date  . Diabetes mellitus   . Hypertension   . Asthma   . Depression     Past Surgical History  Procedure Laterality Date  . Cesarean section    . Cholecystectomy    . Cataract extraction      Family History  Problem Relation Age of Onset  . Stroke Mother   . Diabetes Father   . Heart failure Father   . Hypertension Father   . Diabetes Sister   . Heart failure Sister   . Hypertension Sister   . Cancer Other     History  Substance Use Topics  . Smoking status: Never Smoker   . Smokeless tobacco: Never Used  . Alcohol Use: No    OB History   Grav Para Term Preterm Abortions TAB SAB Ect Mult Living   3 2 2  1  1   2        Review of Systems  Constitutional: Negative for fever and chills.  Gastrointestinal: Positive for nausea. Negative for vomiting.  Genitourinary: Positive for dysuria. Negative for frequency.  Neurological: Positive for numbness and headaches. Negative for weakness.  All other systems reviewed and are negative.    Allergies  Codeine and Sulfa antibiotics  Home Medications   Current Outpatient Rx  Name  Route  Sig  Dispense  Refill  . aspirin EC 81 MG tablet   Oral   Take 81 mg by mouth once as needed for pain.         . furosemide (LASIX) 40 MG tablet   Oral   Take 40 mg by mouth 2 (two) times daily.           Marland Kitchen gabapentin (NEURONTIN) 300 MG capsule   Oral   Take 300 mg by mouth 2 (two) times daily.         . insulin NPH (HUMULIN N,NOVOLIN N) 100 UNIT/ML injection   Subcutaneous   Inject 80-100 Units into the skin 2 (two) times daily. Patient injects 100 units in the morning and 80 units at night.          . insulin regular (HUMULIN R,NOVOLIN R) 100  units/mL injection   Subcutaneous   Inject 30-50 Units into the skin 3 (three) times daily. Patient injects 50 units in the morning then injects between 30 to 50 units at lunch depending on levels, and at night she injects 50 units.          Marland Kitchen LORazepam (ATIVAN) 0.5 MG tablet   Oral   Take 0.5 mg by mouth 3 (three) times daily as needed for anxiety.         . metoprolol (LOPRESSOR) 50 MG tablet   Oral   Take 50 mg by mouth 2 (two) times daily.           Marland Kitchen omeprazole (PRILOSEC) 20 MG capsule   Oral   Take 20 mg by mouth daily.         . potassium chloride SA (K-DUR,KLOR-CON) 20 MEQ tablet   Oral   Take 20 mEq by mouth 2 (two) times daily.          Marland Kitchen acetaminophen (TYLENOL) 500 MG tablet   Oral   Take 1,000 mg by mouth every 6 (six) hours as needed. Pain          . clindamycin (CLEOCIN) 300 MG capsule   Oral   Take 1 capsule (300 mg total) by mouth 4 (four) times daily. X 7 days   28  capsule   0   . gabapentin (NEURONTIN) 100 MG capsule   Oral   Take 1 capsule (100 mg total) by mouth 2 (two) times daily.   30 capsule   0   . ondansetron (ZOFRAN ODT) 4 MG disintegrating tablet      4mg  ODT q4 hours prn nausea/vomit   8 tablet   0     Triage Vitals: BP 156/60  Pulse 62  Temp(Src) 97.7 F (36.5 C) (Oral)  Resp 24  Ht 5' (1.524 m)  Wt 300 lb (136.079 kg)  BMI 58.59 kg/m2  SpO2 93%  Physical Exam  Nursing note and vitals reviewed. Constitutional: She is oriented to person, place, and time. She appears well-developed and well-nourished. No distress.  HENT:  Head: Normocephalic and atraumatic.  Mouth/Throat: Oropharynx is clear and moist.  No maxillary or frontal sinus tenderness  Eyes: Conjunctivae and EOM are normal. Pupils are equal, round, and reactive to light.  Neck: Neck supple. No tracheal deviation present.  Cardiovascular: Normal rate, regular rhythm and normal heart sounds.   Pulmonary/Chest: Effort normal and breath sounds normal. No respiratory distress.  Abdominal: Soft. There is no tenderness.  Musculoskeletal: Normal range of motion. She exhibits no edema.  No calf tenderness or swelling  Neurological: She is alert and oriented to person, place, and time.  Skin: Skin is warm and dry.  2 cm lesion with induration and surrounding erythema with tenderness to palpation to the right knee  Psychiatric: She has a normal mood and affect. Her behavior is normal.    ED Course  Procedures (including critical care time)  Medications  sodium chloride 0.9 % bolus 1,000 mL (0 mLs Intravenous Stopped 04/30/13 2058)  lidocaine-EPINEPHrine (XYLOCAINE-EPINEPHrine) 1 %-1:200000 (with pres) injection (10 mLs  Given 04/30/13 1832)  ondansetron (ZOFRAN) injection 4 mg (4 mg Intravenous Given 04/30/13 1832)  potassium chloride SA (K-DUR,KLOR-CON) CR tablet 40 mEq (40 mEq Oral Given 04/30/13 1922)  potassium chloride 10 mEq in 100 mL IVPB (0 mEq Intravenous  Stopped 04/30/13 2130)  ondansetron (ZOFRAN) injection 4 mg (4 mg Intravenous Given 04/30/13 1952)  ibuprofen (ADVIL,MOTRIN) tablet 600 mg (  600 mg Oral Given 04/30/13 2133)  potassium chloride SA (K-DUR,KLOR-CON) CR tablet 40 mEq (40 mEq Oral Given 04/30/13 2306)    DIAGNOSTIC STUDIES: Oxygen Saturation is 97% on room air, normal by my interpretation.    COORDINATION OF CARE: 5:06 PM-Discussed treatment plan which includes CBC panel, CMP, UA and I&D with pt at bedside and pt agreed to plan.   INCISION AND DRAINAGE PROCEDURE NOTE: Patient identification was confirmed and verbal consent was obtained. This procedure was performed by Teresa Racer, MD at 6:32 PM. Site: right knee Sterile procedures observed Needle size: 25g Anesthetic used (type and amt): 2 cc 1% lidocaine with epi Blade size: 11 Drainage: no purulence expressed Complexity: Complex Packing used Site anesthetized with injection under the skin, 1 cm incision made over site, wound drained and explored loculations, rinsed with copious amounts of normal saline, wound packed with sterile gauze, covered with dry, sterile dressing.  Pt tolerated procedure well without complications.  Instructions for care discussed verbally and pt provided with additional written instructions for homecare and f/u.  Labs Reviewed  CBC WITH DIFFERENTIAL - Abnormal; Notable for the following:    Hemoglobin 15.9 (*)    Basophils Relative 2 (*)    All other components within normal limits  COMPREHENSIVE METABOLIC PANEL - Abnormal; Notable for the following:    Sodium 134 (*)    Potassium 2.7 (*)    Chloride 87 (*)    CO2 36 (*)    Glucose, Bld 188 (*)    Albumin 3.1 (*)    AST 70 (*)    ALT 57 (*)    Alkaline Phosphatase 257 (*)    GFR calc non Af Amer 80 (*)    All other components within normal limits  URINALYSIS, ROUTINE W REFLEX MICROSCOPIC - Abnormal; Notable for the following:    APPearance HAZY (*)    Glucose, UA 250 (*)    Hgb urine  dipstick SMALL (*)    Protein, ur 100 (*)    All other components within normal limits  URINE MICROSCOPIC-ADD ON - Abnormal; Notable for the following:    Squamous Epithelial / LPF MANY (*)    Bacteria, UA FEW (*)    All other components within normal limits  BASIC METABOLIC PANEL - Abnormal; Notable for the following:    Potassium 2.8 (*)    Chloride 91 (*)    CO2 34 (*)    All other components within normal limits     1. Cellulitis   2. Hypokalemia   3. Peripheral neuropathy       MDM  I personally performed the services described in this documentation, which was scribed in my presence. The recorded information has been reviewed and is accurate.  Pt feeling better in ED. No vomiting. Continues to have low potassium. Pt is declining admission. States she will f/u with her PMD. return precautions given.    Teresa Racer, MD 04/30/13 [phone]

## 2013-04-30 NOTE — ED Notes (Signed)
I&D tray with 1% lidocaine with epi placed at bedside per EDP.

## 2013-07-18 ENCOUNTER — Other Ambulatory Visit: Payer: Self-pay | Admitting: Internal Medicine

## 2013-07-18 NOTE — Telephone Encounter (Signed)
Rx was sent to pharmacy electronically. 

## 2013-09-07 ENCOUNTER — Other Ambulatory Visit: Payer: Self-pay | Admitting: Internal Medicine

## 2013-09-07 NOTE — Telephone Encounter (Signed)
Rx was sent to pharmacy electronically. 

## 2014-01-03 ENCOUNTER — Encounter (HOSPITAL_COMMUNITY): Payer: Self-pay | Admitting: Emergency Medicine

## 2014-01-03 ENCOUNTER — Emergency Department (HOSPITAL_COMMUNITY)
Admission: EM | Admit: 2014-01-03 | Discharge: 2014-01-03 | Disposition: A | Discharge status: Home / Self Care | Payer: PRIVATE HEALTH INSURANCE | Attending: Emergency Medicine | Admitting: Emergency Medicine

## 2014-01-03 ENCOUNTER — Emergency Department (HOSPITAL_COMMUNITY): Payer: PRIVATE HEALTH INSURANCE

## 2014-01-03 DIAGNOSIS — R0609 Other forms of dyspnea: Secondary | ICD-10-CM | POA: Insufficient documentation

## 2014-01-03 DIAGNOSIS — Z79899 Other long term (current) drug therapy: Secondary | ICD-10-CM | POA: Insufficient documentation

## 2014-01-03 DIAGNOSIS — Z792 Long term (current) use of antibiotics: Secondary | ICD-10-CM | POA: Insufficient documentation

## 2014-01-03 DIAGNOSIS — I10 Essential (primary) hypertension: Secondary | ICD-10-CM | POA: Insufficient documentation

## 2014-01-03 DIAGNOSIS — J45901 Unspecified asthma with (acute) exacerbation: Secondary | ICD-10-CM | POA: Insufficient documentation

## 2014-01-03 DIAGNOSIS — Z8659 Personal history of other mental and behavioral disorders: Secondary | ICD-10-CM | POA: Insufficient documentation

## 2014-01-03 DIAGNOSIS — E119 Type 2 diabetes mellitus without complications: Secondary | ICD-10-CM | POA: Insufficient documentation

## 2014-01-03 DIAGNOSIS — Z8701 Personal history of pneumonia (recurrent): Secondary | ICD-10-CM | POA: Insufficient documentation

## 2014-01-03 DIAGNOSIS — R0989 Other specified symptoms and signs involving the circulatory and respiratory systems: Principal | ICD-10-CM | POA: Insufficient documentation

## 2014-01-03 DIAGNOSIS — Z794 Long term (current) use of insulin: Secondary | ICD-10-CM | POA: Insufficient documentation

## 2014-01-03 DIAGNOSIS — R0600 Dyspnea, unspecified: Secondary | ICD-10-CM

## 2014-01-03 IMAGING — CR DG CHEST 2V
2 series · 2 of 2 positions shown · non-contrast
Comparison: Chest radiograph [DATE].

CLINICAL DATA: Cough and shortness of breath for 4 days.

EXAM:
CHEST  2 VIEW

[view not recorded (1 of 2)]
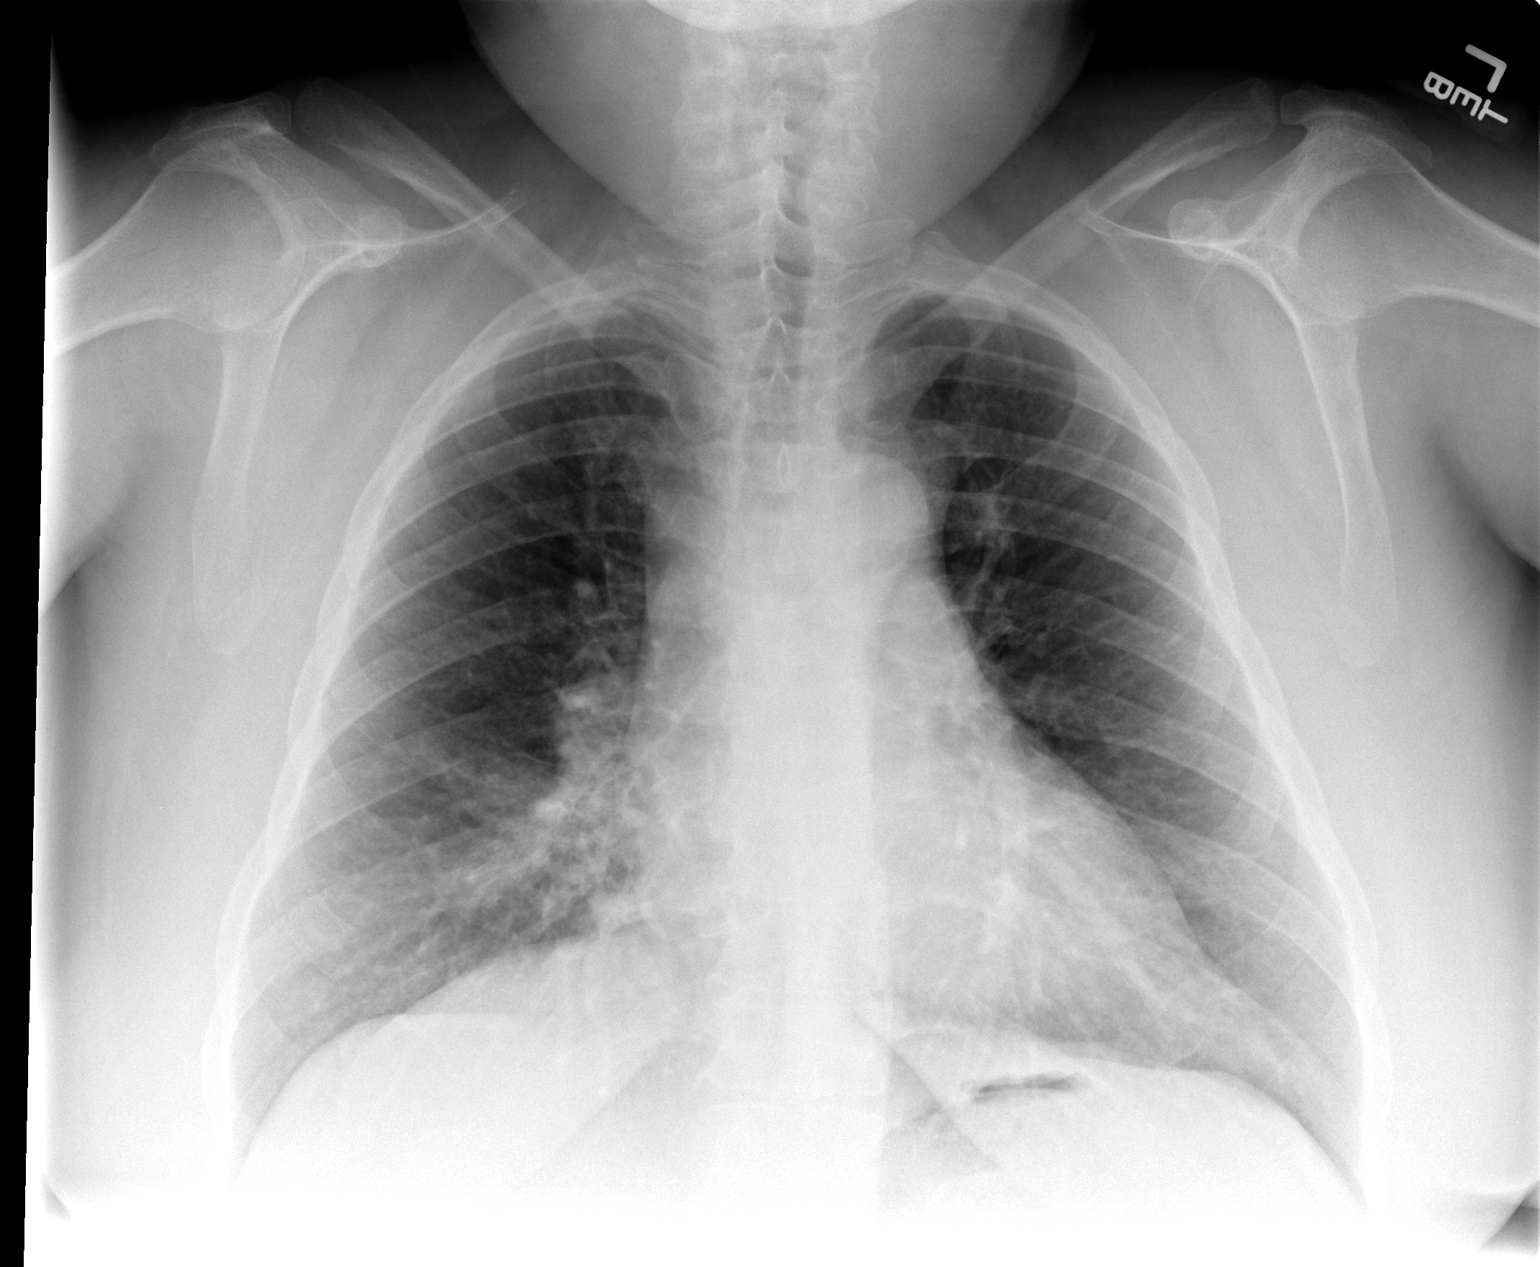

[view not recorded (2 of 2)]
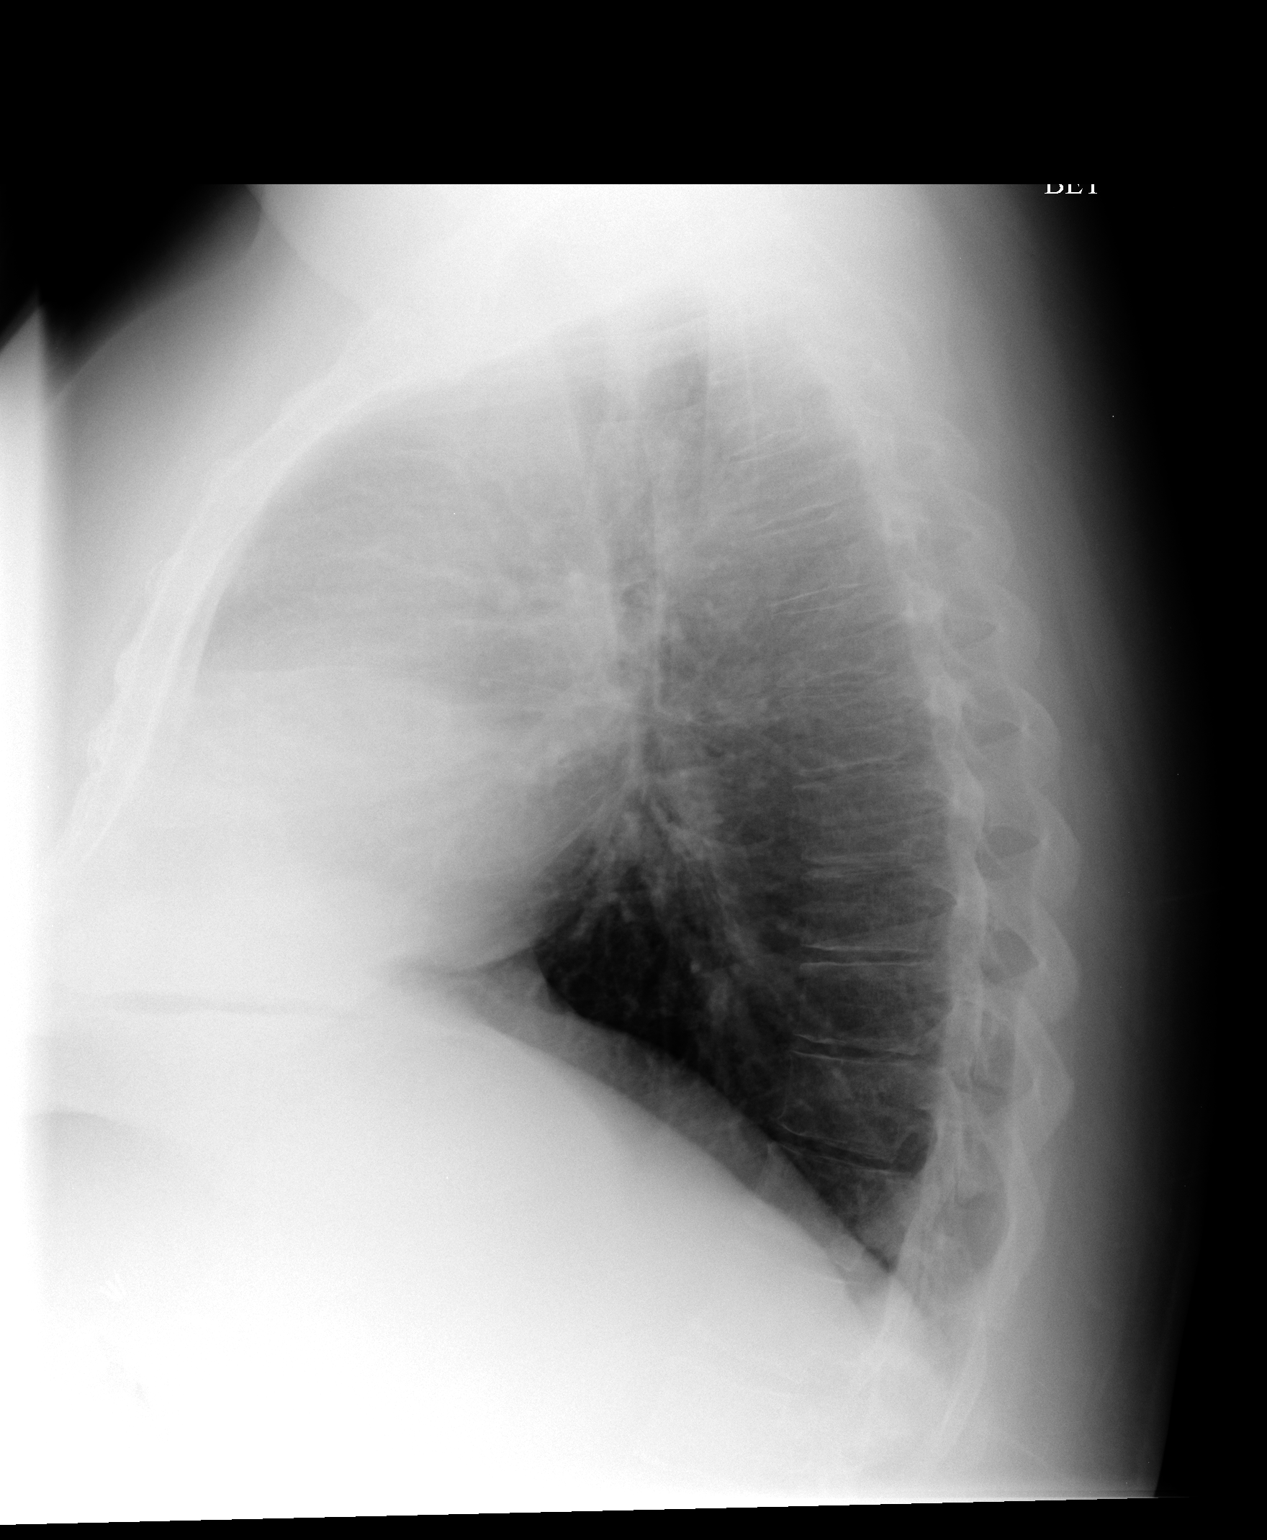

[2 of 2 positions shown; findings below may reference images not displayed]

FINDINGS: Similar diffuse interstitial prominence, somewhat confluent in the
lung bases, without pleural effusions or focal consolidations.
Slightly increased lung volumes with flattening of the
hemidiaphragms. No pneumothorax. Cardiac silhouette is upper limits
of normal, mediastinal silhouette is unremarkable.

Soft tissue planes and included osseous structures are unchanged.
Surgical clips in the abdomen likely reflect cholecystectomy.
IMPRESSION: Borderline cardiomegaly. Similar interstitial prominence somewhat
confluent in the lung bases which could reflect scarring and/or
bronchiectasis.

  By: KATINSUS

## 2014-01-03 MED ORDER — PREDNISONE 20 MG PO TABS: 40.0000 mg | ORAL_TABLET | Freq: Every day | ORAL | Status: AC

## 2014-01-03 MED ORDER — PREDNISONE 20 MG PO TABS
40.0000 mg | ORAL_TABLET | ORAL | Status: AC
  Administered 2014-01-03: 40 mg via ORAL
  Filled 2014-01-03: qty 2

## 2014-01-03 NOTE — Discharge Instructions (Signed)
As discussed, it is important that you follow up as soon as possible with your physician for continued management of your condition. ° °If you develop any new, or concerning changes in your condition, please return to the emergency department immediately. ° °

## 2014-01-03 NOTE — ED Notes (Signed)
Gave patient ice water as requested.  

## 2014-01-03 NOTE — ED Notes (Signed)
Cough for 4 days, seen at Urgent care and taking an antibiotic,  Hoarse

## 2014-01-03 NOTE — ED Provider Notes (Signed)
CSN: [id number]     Arrival date & time 01/03/14  1933 History  This chart was scribed for Teresa Muskrat, MD by Rolanda Lundborg, ED Scribe. This patient was seen in room APA05/APA05 and the patient's care was started at 10:20 PM.    Chief Complaint  Patient presents with  . Shortness of Breath   The history is provided by the patient. No language interpreter was used.   HPI Comments: [name] is a 53 y.o. female who presents to the Emergency Department complaining of  SOB since 10am this morning while she was at work. She reports a cough for the last 4 days. She reports her voice is hoarse. Pt reports she has had pneumonia 3x and is worried she has it again. Pt was seen at Urgent Care 2-3 days ago and prescribed Keflex but their x-rays were broken. She is not on an inhaler regularly but has been using Advair in the last few days.   Past Medical History  Diagnosis Date  . Diabetes mellitus   . Hypertension   . Asthma   . Depression    Past Surgical History  Procedure Laterality Date  . Cesarean section    . Cholecystectomy    . Cataract extraction     Family History  Problem Relation Age of Onset  . Stroke Mother   . Diabetes Father   . Heart failure Father   . Hypertension Father   . Diabetes Sister   . Heart failure Sister   . Hypertension Sister   . Cancer Other    History  Substance Use Topics  . Smoking status: Never Smoker   . Smokeless tobacco: Never Used  . Alcohol Use: No   OB History   Grav Para Term Preterm Abortions TAB SAB Ect Mult Living   3 2 2  1  1   2      Review of Systems  Constitutional:       Per HPI, otherwise negative  HENT:       Per HPI, otherwise negative  Respiratory:       Per HPI, otherwise negative  Cardiovascular:       Per HPI, otherwise negative  Gastrointestinal: Negative for vomiting.  Endocrine:       Negative aside from HPI  Genitourinary:       Neg aside from HPI   Musculoskeletal:       Per HPI, otherwise  negative  Skin: Negative.   Neurological: Negative for syncope.    Allergies  Codeine and Sulfa antibiotics  Home Medications   Current Outpatient Rx  Name  Route  Sig  Dispense  Refill  . acetaminophen (TYLENOL) 500 MG tablet   Oral   Take 1,000 mg by mouth every 6 (six) hours as needed. Pain          . aspirin EC 81 MG tablet   Oral   Take 81 mg by mouth once as needed for pain.         . clindamycin (CLEOCIN) 300 MG capsule   Oral   Take 1 capsule (300 mg total) by mouth 4 (four) times daily. X 7 days   28 capsule   0   . furosemide (LASIX) 40 MG tablet   Oral   Take 40 mg by mouth 2 (two) times daily.           Marland Kitchen gabapentin (NEURONTIN) 100 MG capsule   Oral   Take 1 capsule (100 mg total)  by mouth 2 (two) times daily.   30 capsule   0   . gabapentin (NEURONTIN) 300 MG capsule   Oral   Take 300 mg by mouth 2 (two) times daily.         . insulin NPH (HUMULIN N,NOVOLIN N) 100 UNIT/ML injection   Subcutaneous   Inject 80-100 Units into the skin 2 (two) times daily. Patient injects 100 units in the morning and 80 units at night.          . insulin regular (HUMULIN R,NOVOLIN R) 100 units/mL injection   Subcutaneous   Inject 30-50 Units into the skin 3 (three) times daily. Patient injects 50 units in the morning then injects between 30 to 50 units at lunch depending on levels, and at night she injects 50 units.          Marland Kitchen LORazepam (ATIVAN) 0.5 MG tablet   Oral   Take 0.5 mg by mouth 3 (three) times daily as needed for anxiety.         . metoprolol (LOPRESSOR) 50 MG tablet   Oral   Take 50 mg by mouth 2 (two) times daily.           Marland Kitchen omeprazole (PRILOSEC) 20 MG capsule   Oral   Take 20 mg by mouth daily.         . ondansetron (ZOFRAN ODT) 4 MG disintegrating tablet      4mg  ODT q4 hours prn nausea/vomit   8 tablet   0   . potassium chloride SA (K-DUR,KLOR-CON) 20 MEQ tablet   Oral   Take 20 mEq by mouth 2 (two) times daily.           . propranolol ER (INDERAL LA) 120 MG 24 hr capsule      Take 1 capsule (120mg ) by mouth daily. PLEASE MAKE APPOINTMENT.   30 capsule   0     Patient needs office visit for future refills.    BP 142/50  Pulse 75  Temp(Src) 98.1 F (36.7 C) (Oral)  Resp 20  Ht 5\' 1"  (1.549 m)  Wt 288 lb (130.636 kg)  BMI 54.45 kg/m2  SpO2 92%  LMP 08/06/2012 Physical Exam  Nursing note and vitals reviewed. Constitutional: She is oriented to person, place, and time. She appears well-developed and well-nourished. No distress.  HENT:  Head: Normocephalic and atraumatic.  Mouth/Throat: Oropharynx is clear and moist.  Eyes: Conjunctivae and EOM are normal.  Cardiovascular: Normal rate and regular rhythm.   Pulmonary/Chest: Effort normal and breath sounds normal. No stridor. No respiratory distress.  Abdominal: She exhibits no distension.  Musculoskeletal: She exhibits no edema.  Neurological: She is alert and oriented to person, place, and time. No cranial nerve deficit.  Skin: Skin is warm and dry.  Psychiatric: She has a normal mood and affect.    ED Course  Procedures (including critical care time) Medications - No data to display  COORDINATION OF CARE: 10:31 PM- Discussed treatment plan with pt. Pt agrees to plan.    Labs Review Labs Reviewed - No data to display Imaging Review Dg Chest 2 View  01/03/2014   CLINICAL DATA:  Cough and shortness of breath for 4 days.  EXAM: CHEST  2 VIEW  COMPARISON:  Chest radiograph October 10, 2013.  FINDINGS: Similar diffuse interstitial prominence, somewhat confluent in the lung bases, without pleural effusions or focal consolidations. Slightly increased lung volumes with flattening of the hemidiaphragms. No pneumothorax. Cardiac silhouette is upper limits  of normal, mediastinal silhouette is unremarkable.  Soft tissue planes and included osseous structures are unchanged. Surgical clips in the abdomen likely reflect cholecystectomy.  IMPRESSION:  Borderline cardiomegaly. Similar interstitial prominence somewhat confluent in the lung bases which could reflect scarring and/or bronchiectasis.   Electronically Signed   By: Elon Alas   On: 01/03/2014 20:33    EKG Interpretation   None       MDM   I personally performed the services described in this documentation, which was scribed in my presence. The recorded information has been reviewed and is accurate.   Patient presents afebrile, and in no distress, with concerns of ongoing dyspnea, cough.  On with her hoarseness, her ongoing cough, some concern for bronchitis, she started on a course of steroids to come antibiotics she started a few days ago.  Absent distress, with reassuring vitals, exam, she was discharged in stable condition to follow up with primary care.  Teresa Muskrat, MD 01/03/14 2320

## 2014-04-04 ENCOUNTER — Other Ambulatory Visit (HOSPITAL_COMMUNITY): Payer: Self-pay | Admitting: Adult Health Nurse Practitioner

## 2014-04-04 DIAGNOSIS — Z1231 Encounter for screening mammogram for malignant neoplasm of breast: Secondary | ICD-10-CM

## 2014-04-09 ENCOUNTER — Inpatient Hospital Stay (HOSPITAL_COMMUNITY): Admission: RE | Admit: 2014-04-09 | Payer: PRIVATE HEALTH INSURANCE | Source: Ambulatory Visit

## 2014-07-22 ENCOUNTER — Encounter (HOSPITAL_COMMUNITY): Payer: Self-pay | Admitting: Emergency Medicine

## 2014-07-22 ENCOUNTER — Observation Stay (HOSPITAL_COMMUNITY)
Admission: EM | Admit: 2014-07-22 | Discharge: 2014-07-23 | Disposition: A | Discharge status: Home / Self Care | Payer: PRIVATE HEALTH INSURANCE | Attending: Internal Medicine | Admitting: Internal Medicine

## 2014-07-22 DIAGNOSIS — E119 Type 2 diabetes mellitus without complications: Secondary | ICD-10-CM | POA: Insufficient documentation

## 2014-07-22 DIAGNOSIS — R002 Palpitations: Secondary | ICD-10-CM | POA: Diagnosis present

## 2014-07-22 DIAGNOSIS — J45909 Unspecified asthma, uncomplicated: Secondary | ICD-10-CM | POA: Diagnosis not present

## 2014-07-22 DIAGNOSIS — I10 Essential (primary) hypertension: Secondary | ICD-10-CM | POA: Diagnosis not present

## 2014-07-22 DIAGNOSIS — R5381 Other malaise: Principal | ICD-10-CM | POA: Insufficient documentation

## 2014-07-22 DIAGNOSIS — R5383 Other fatigue: Secondary | ICD-10-CM | POA: Diagnosis present

## 2014-07-22 DIAGNOSIS — Z794 Long term (current) use of insulin: Secondary | ICD-10-CM | POA: Insufficient documentation

## 2014-07-22 DIAGNOSIS — F3289 Other specified depressive episodes: Secondary | ICD-10-CM | POA: Diagnosis not present

## 2014-07-22 DIAGNOSIS — E876 Hypokalemia: Secondary | ICD-10-CM | POA: Diagnosis present

## 2014-07-22 DIAGNOSIS — Z79899 Other long term (current) drug therapy: Secondary | ICD-10-CM | POA: Diagnosis not present

## 2014-07-22 DIAGNOSIS — F329 Major depressive disorder, single episode, unspecified: Secondary | ICD-10-CM | POA: Insufficient documentation

## 2014-07-22 DIAGNOSIS — R531 Weakness: Secondary | ICD-10-CM | POA: Diagnosis present

## 2014-07-22 DIAGNOSIS — J449 Chronic obstructive pulmonary disease, unspecified: Secondary | ICD-10-CM | POA: Diagnosis present

## 2014-07-22 DIAGNOSIS — J4489 Other specified chronic obstructive pulmonary disease: Secondary | ICD-10-CM | POA: Insufficient documentation

## 2014-07-22 DIAGNOSIS — F32A Depression, unspecified: Secondary | ICD-10-CM | POA: Insufficient documentation

## 2014-07-22 DIAGNOSIS — R300 Dysuria: Secondary | ICD-10-CM | POA: Diagnosis not present

## 2014-07-22 HISTORY — DX: Chronic obstructive pulmonary disease, unspecified: J44.9

## 2014-07-22 LAB — URINALYSIS, ROUTINE W REFLEX MICROSCOPIC
Bilirubin Urine: NEGATIVE
Glucose, UA: NEGATIVE mg/dL
Hgb urine dipstick: NEGATIVE
Ketones, ur: NEGATIVE mg/dL
Nitrite: NEGATIVE
Protein, ur: NEGATIVE mg/dL
Specific Gravity, Urine: <1.005 — ABNORMAL LOW (ref 1.005–1.030)
Urobilinogen, UA: 0.2 mg/dL (ref 0.0–1.0)
pH: 5.5 (ref 5.0–8.0)

## 2014-07-22 LAB — CBC WITH DIFFERENTIAL/PLATELET
Basophils Absolute: 0 10*3/uL (ref 0.0–0.1)
Basophils Relative: 0 % (ref 0–1)
Eosinophils Absolute: 0.4 10*3/uL (ref 0.0–0.7)
Eosinophils Relative: 3 % (ref 0–5)
HCT: 46.2 % — ABNORMAL HIGH (ref 36.0–46.0)
Hemoglobin: 16.4 g/dL — ABNORMAL HIGH (ref 12.0–15.0)
Lymphocytes Relative: 23 % (ref 12–46)
Lymphs Abs: 2.8 10*3/uL (ref 0.7–4.0)
MCH: 32.4 pg (ref 26.0–34.0)
MCHC: 35.5 g/dL (ref 30.0–36.0)
MCV: 91.3 fL (ref 78.0–100.0)
Monocytes Absolute: 0.5 10*3/uL (ref 0.1–1.0)
Monocytes Relative: 5 % (ref 3–12)
Neutro Abs: 8.3 10*3/uL — ABNORMAL HIGH (ref 1.7–7.7)
Neutrophils Relative %: 69 % (ref 43–77)
Platelets: 322 10*3/uL (ref 150–400)
RBC: 5.06 MIL/uL (ref 3.87–5.11)
RDW: 13.5 % (ref 11.5–15.5)
WBC: 12 10*3/uL — ABNORMAL HIGH (ref 4.0–10.5)

## 2014-07-22 LAB — BASIC METABOLIC PANEL
Anion gap: 14 (ref 5–15)
BUN: 21 mg/dL (ref 6–23)
CO2: 36 mEq/L — ABNORMAL HIGH (ref 19–32)
Calcium: 9.4 mg/dL (ref 8.4–10.5)
Chloride: 91 mEq/L — ABNORMAL LOW (ref 96–112)
Creatinine, Ser: 1.02 mg/dL (ref 0.50–1.10)
GFR calc Af Amer: 71 mL/min — ABNORMAL LOW (ref >90)
GFR calc non Af Amer: 62 mL/min — ABNORMAL LOW (ref >90)
Glucose, Bld: 144 mg/dL — ABNORMAL HIGH (ref 70–99)
Potassium: 2.4 mEq/L — CL (ref 3.7–5.3)
Sodium: 141 mEq/L (ref 137–147)

## 2014-07-22 LAB — URINE MICROSCOPIC-ADD ON

## 2014-07-22 LAB — MAGNESIUM: Magnesium: 1.6 mg/dL (ref 1.5–2.5)

## 2014-07-22 MED ORDER — GABAPENTIN 100 MG PO CAPS
100.0000 mg | ORAL_CAPSULE | Freq: Every day | ORAL | Status: DC
  Administered 2014-07-23: 100 mg via ORAL
  Filled 2014-07-22: qty 1

## 2014-07-22 MED ORDER — INSULIN NPH (HUMAN) (ISOPHANE) 100 UNIT/ML ~~LOC~~ SUSP: 80.0000 [IU] | Freq: Two times a day (BID) | SUBCUTANEOUS | Status: DC

## 2014-07-22 MED ORDER — ACETAMINOPHEN 325 MG PO TABS: 650.0000 mg | ORAL_TABLET | Freq: Four times a day (QID) | ORAL | Status: DC | PRN

## 2014-07-22 MED ORDER — ENOXAPARIN SODIUM 40 MG/0.4ML ~~LOC~~ SOLN: 40.0000 mg | SUBCUTANEOUS | Status: DC

## 2014-07-22 MED ORDER — SODIUM CHLORIDE 0.9 % IV BOLUS (SEPSIS)
1000.0000 mL | Freq: Once | INTRAVENOUS | Status: AC
  Administered 2014-07-22: 1000 mL via INTRAVENOUS

## 2014-07-22 MED ORDER — POTASSIUM CHLORIDE CRYS ER 20 MEQ PO TBCR
40.0000 meq | EXTENDED_RELEASE_TABLET | Freq: Two times a day (BID) | ORAL | Status: DC
  Administered 2014-07-23: 40 meq via ORAL
  Filled 2014-07-22: qty 2

## 2014-07-22 MED ORDER — ONDANSETRON HCL 4 MG PO TABS: 4.0000 mg | ORAL_TABLET | Freq: Four times a day (QID) | ORAL | Status: DC | PRN

## 2014-07-22 MED ORDER — METOLAZONE 5 MG PO TABS
5.0000 mg | ORAL_TABLET | Freq: Every morning | ORAL | Status: DC
  Administered 2014-07-23: 5 mg via ORAL
  Filled 2014-07-22: qty 1

## 2014-07-22 MED ORDER — ALUM & MAG HYDROXIDE-SIMETH 200-200-20 MG/5ML PO SUSP: 30.0000 mL | Freq: Four times a day (QID) | ORAL | Status: DC | PRN

## 2014-07-22 MED ORDER — ESCITALOPRAM OXALATE 10 MG PO TABS: 20.0000 mg | ORAL_TABLET | Freq: Every evening | ORAL | Status: DC

## 2014-07-22 MED ORDER — LORAZEPAM 1 MG PO TABS
1.0000 mg | ORAL_TABLET | Freq: Every day | ORAL | Status: DC
  Administered 2014-07-23: 1 mg via ORAL
  Filled 2014-07-22: qty 1

## 2014-07-22 MED ORDER — INSULIN ASPART 100 UNIT/ML ~~LOC~~ SOLN
30.0000 [IU] | Freq: Three times a day (TID) | SUBCUTANEOUS | Status: DC
  Administered 2014-07-23 (×2): 30 [IU] via SUBCUTANEOUS

## 2014-07-22 MED ORDER — POTASSIUM CHLORIDE CRYS ER 20 MEQ PO TBCR
40.0000 meq | EXTENDED_RELEASE_TABLET | Freq: Once | ORAL | Status: AC
  Administered 2014-07-22: 40 meq via ORAL
  Filled 2014-07-22: qty 2

## 2014-07-22 MED ORDER — INSULIN NPH (HUMAN) (ISOPHANE) 100 UNIT/ML ~~LOC~~ SUSP
80.0000 [IU] | Freq: Every day | SUBCUTANEOUS | Status: DC
  Administered 2014-07-23: 80 [IU] via SUBCUTANEOUS

## 2014-07-22 MED ORDER — DOCUSATE SODIUM 100 MG PO CAPS: 100.0000 mg | ORAL_CAPSULE | Freq: Every day | ORAL | Status: DC | PRN

## 2014-07-22 MED ORDER — INSULIN NPH (HUMAN) (ISOPHANE) 100 UNIT/ML ~~LOC~~ SUSP
100.0000 [IU] | Freq: Every day | SUBCUTANEOUS | Status: DC
  Administered 2014-07-23: 100 [IU] via SUBCUTANEOUS
  Filled 2014-07-22: qty 10

## 2014-07-22 MED ORDER — PROPRANOLOL HCL ER 120 MG PO CP24
120.0000 mg | ORAL_CAPSULE | Freq: Every morning | ORAL | Status: DC
  Administered 2014-07-23: 120 mg via ORAL
  Filled 2014-07-22 (×2): qty 1

## 2014-07-22 MED ORDER — ALBUTEROL SULFATE (2.5 MG/3ML) 0.083% IN NEBU: 3.0000 mL | INHALATION_SOLUTION | Freq: Four times a day (QID) | RESPIRATORY_TRACT | Status: DC | PRN

## 2014-07-22 MED ORDER — ACETAMINOPHEN 650 MG RE SUPP: 650.0000 mg | Freq: Four times a day (QID) | RECTAL | Status: DC | PRN

## 2014-07-22 MED ORDER — PANTOPRAZOLE SODIUM 40 MG PO TBEC
40.0000 mg | DELAYED_RELEASE_TABLET | Freq: Every day | ORAL | Status: DC
  Administered 2014-07-23: 40 mg via ORAL
  Filled 2014-07-22: qty 1

## 2014-07-22 MED ORDER — POTASSIUM CHLORIDE 10 MEQ/100ML IV SOLN
10.0000 meq | INTRAVENOUS | Status: AC
Start: 2014-07-22 — End: 2014-07-23
  Administered 2014-07-23 (×2): 10 meq via INTRAVENOUS
  Filled 2014-07-22: qty 100

## 2014-07-22 MED ORDER — FUROSEMIDE 40 MG PO TABS
40.0000 mg | ORAL_TABLET | Freq: Two times a day (BID) | ORAL | Status: DC
  Administered 2014-07-23: 40 mg via ORAL
  Filled 2014-07-22: qty 1

## 2014-07-22 MED ORDER — AMLODIPINE BESYLATE 5 MG PO TABS: 5.0000 mg | ORAL_TABLET | Freq: Every evening | ORAL | Status: DC

## 2014-07-22 MED ORDER — SODIUM CHLORIDE 0.9 % IJ SOLN
3.0000 mL | Freq: Two times a day (BID) | INTRAMUSCULAR | Status: DC
  Administered 2014-07-23: 3 mL via INTRAVENOUS

## 2014-07-22 MED ORDER — POTASSIUM CHLORIDE 10 MEQ/100ML IV SOLN
10.0000 meq | INTRAVENOUS | Status: DC
  Administered 2014-07-22: 10 meq via INTRAVENOUS
  Filled 2014-07-22 (×4): qty 100

## 2014-07-22 MED ORDER — ONDANSETRON HCL 4 MG/2ML IJ SOLN: 4.0000 mg | Freq: Four times a day (QID) | INTRAMUSCULAR | Status: DC | PRN

## 2014-07-22 NOTE — H&P (Signed)
History and Physical  [name] QQV:[id number] DOB: 04-Feb-1961 DOA: 07/22/2014  Referring physician: Emergency Department PCP: Sherrie Mustache, MD   Chief Complaint: Palpitations  HPI: [name] is a 53 y.o. female with insulin-dependent diabetes who presents to the hospital with 36 hours of palpitations and generalized weakness, which has been increasing over the course of the day. The patient's closure was low this morning and despite increasing her serum glucose levels, her weakness and palpitations remained.  Her palpitations occur 3 or 4 times  day and are in the form of skipped beats.   Review of Systems:  Pt complains of palpitations.  Pt denies any fevers, chills, chest pain, chest pressure, shortness of breath, wheezing, abdominal pain, nausea, vomiting.  Review of systems are otherwise negative  Past Medical History  Diagnosis Date  . Diabetes mellitus   . Hypertension   . Asthma   . Depression   . COPD (chronic obstructive pulmonary disease)    Past Surgical History  Procedure Laterality Date  . Cesarean section    . Cholecystectomy    . Cataract extraction     Social History:  reports that she has never smoked. She has never used smokeless tobacco. She reports that she does not drink alcohol or use illicit drugs. Patient lives at home & is able to participate in activities of daily living without assistance  Allergies  Allergen Reactions  . Benicar [Olmesartan] Swelling  . Codeine Other (See Comments)    Confusion   . Sulfa Antibiotics Swelling    Whole face swells    Family History  Problem Relation Age of Onset  . Stroke Mother   . Diabetes Father   . Heart failure Father   . Hypertension Father   . Diabetes Sister   . Heart failure Sister   . Hypertension Sister   . Cancer Other      Prior to Admission medications   Medication Sig Start Date End Date Taking? Authorizing Provider  albuterol (PROAIR HFA) 108 (90 BASE)  MCG/ACT inhaler Inhale 2 puffs into the lungs every 6 (six) hours as needed for wheezing or shortness of breath.   Yes Historical Provider, MD  amLODipine (NORVASC) 5 MG tablet Take 5 mg by mouth every evening.   Yes Historical Provider, MD  escitalopram (LEXAPRO) 20 MG tablet Take 20 mg by mouth every evening.   Yes Historical Provider, MD  esomeprazole (NEXIUM) 20 MG capsule Take 20 mg by mouth daily at 12 noon.   Yes Historical Provider, MD  furosemide (LASIX) 40 MG tablet Take 40 mg by mouth 2 (two) times daily.   Yes Historical Provider, MD  gabapentin (NEURONTIN) 100 MG capsule Take 100 mg by mouth at bedtime.   Yes Historical Provider, MD  insulin NPH (HUMULIN N,NOVOLIN N) 100 UNIT/ML injection Inject 80-100 Units into the skin 2 (two) times daily. Patient injects 100 units in the morning and 80 units at night.    Yes Historical Provider, MD  insulin regular (HUMULIN R,NOVOLIN R) 100 units/mL injection Inject 30-50 Units into the skin 3 (three) times daily. Patient injects 50 units in the morning then injects between 30 to 50 units at lunch depending on levels, and at night she injects 50 units.    Yes Historical Provider, MD  LORazepam (ATIVAN) 1 MG tablet Take 1 mg by mouth at bedtime.   Yes Historical Provider, MD  metolazone (ZAROXOLYN) 5 MG tablet Take 5 mg by mouth every morning.   Yes Historical  Provider, MD  propranolol ER (INDERAL LA) 120 MG 24 hr capsule Take 120 mg by mouth every morning.   Yes Historical Provider, MD    Physical Exam: BP 147/69  Pulse 66  Temp(Src) 98.5 F (36.9 C) (Oral)  Resp 18  Ht 5\' 1"  (1.549 m)  Wt 136.079 kg (300 lb)  BMI 56.71 kg/m2  SpO2 96%  LMP 08/06/2012  General:  In general this is a obese middle-aged Caucasian female who is awake alert and oriented x3. Eyes: Pupils equal, round, reactive to light. Sclerae anicteric not injected. ENT: External auditory canals are patent and tympanic membranes reflect complaint. Oropharynx is without  lesion. Neck: Supple and without lymphadenopathy. Cardiovascular: Regular rate with normal S1-S2 sounds. No murmurs auscultated. No jugular venous distention Respiratory: Clear to auscultation bilaterally with no wheezes rales rhonchi. Abdomen: Soft, morbidly obese, nontender Skin: No rashes, bruises, petechiae Musculoskeletal: No edema or erythema of any major joints. Psychiatric: Appropriate insight and judgment Neurologic: No focal neurological deficit observed           Labs on Admission:  Basic Metabolic Panel:  Recent Labs Lab 07/22/14 1927  NA 141  K 2.4*  CL 91*  CO2 36*  GLUCOSE 144*  BUN 21  CREATININE 1.02  CALCIUM 9.4   Liver Function Tests: No results found for this basename: AST, ALT, ALKPHOS, BILITOT, PROT, ALBUMIN,  in the last 168 hours No results found for this basename: LIPASE, AMYLASE,  in the last 168 hours No results found for this basename: AMMONIA,  in the last 168 hours CBC:  Recent Labs Lab 07/22/14 1927  WBC 12.0*  NEUTROABS 8.3*  HGB 16.4*  HCT 46.2*  MCV 91.3  PLT 322   Cardiac Enzymes: No results found for this basename: CKTOTAL, CKMB, CKMBINDEX, TROPONINI,  in the last 168 hours  BNP (last 3 results) No results found for this basename: PROBNP,  in the last 8760 hours CBG: No results found for this basename: GLUCAP,  in the last 168 hours  Radiological Exams on Admission: No results found.  EKG: Independently reviewed. Heart rate in the 60s with a normal sinus rhythm. Normal PR and QRS intervals with flattened T waves and prolonged QT interval  Assessment/Plan Present on Admission:  . Hypokalemia  #1 symptomatic hypokalemia with EKG changes - likely due to diuretic use as both Lasix and Zaroxolyn can lead to hypokalemia Admit to observation Patient to remain on telemetry Replace potassium with K. riders throughout the night with oral potassium 40 mEq now and tomorrow morning. Repeat potassium in the morning We'll also  check magnesium level now as this may need to be replaced  #2 diabetes type - insulin-dependent Check CBGs Insulin home dose  #3 hypertension - continue home dose of medication.  Consultants: None  Code Status: Full code  Family Communication: None   Disposition Plan: Return home likely tomorrow morning after potassium stabilized -   Time spent: 50 minutes  [place], [place] [facility] Pager [phone]  **Disclaimer: This note may have been dictated with voice recognition software. Similar sounding words can inadvertently be transcribed and this note may contain transcription errors which may not have been corrected upon publication of note.**

## 2014-07-22 NOTE — ED Provider Notes (Signed)
TIME SEEN: 7:26 PM  CHIEF COMPLAINT: Fatigue, generalized weakness, dysuria, hyperglycemia  HPI: Patient is a 53 y.o. F with history of insulin-dependent diabetes, hypertension, COPD who presents emergency department with complaints of jaundice weakness, fatigue, and dysuria that started this morning. She states that she woke up this morning not feeling well and felt very weak. She had intermittent palpitations. She checked her blood glucose and it was 44. It improved with eating. She states that she has had some dysuria today. No hematuria. No fevers or chills. No chest pain or shortness of breath. No vomiting or diarrhea. No bloody stool or melena. No numbness or focal weakness. No headache, neck pain or neck stiffness.  ROS: See HPI Constitutional: no fever  Eyes: no drainage  ENT: no runny nose   Cardiovascular:  no chest pain  Resp: no SOB  GI: no vomiting GU: no dysuria Integumentary: no rash  Allergy: no hives  Musculoskeletal: no leg swelling  Neurological: no slurred speech ROS otherwise negative  PAST MEDICAL HISTORY/PAST SURGICAL HISTORY:  Past Medical History  Diagnosis Date  . Diabetes mellitus   . Hypertension   . Asthma   . Depression   . COPD (chronic obstructive pulmonary disease)     MEDICATIONS:  Prior to Admission medications   Medication Sig Start Date End Date Taking? Authorizing Provider  albuterol (PROAIR HFA) 108 (90 BASE) MCG/ACT inhaler Inhale 2 puffs into the lungs every 6 (six) hours as needed for wheezing or shortness of breath.   Yes Historical Provider, MD  amLODipine (NORVASC) 5 MG tablet Take 5 mg by mouth every evening.   Yes Historical Provider, MD  escitalopram (LEXAPRO) 20 MG tablet Take 20 mg by mouth every evening.   Yes Historical Provider, MD  esomeprazole (NEXIUM) 20 MG capsule Take 20 mg by mouth daily at 12 noon.   Yes Historical Provider, MD  furosemide (LASIX) 40 MG tablet Take 40 mg by mouth 2 (two) times daily.   Yes Historical  Provider, MD  gabapentin (NEURONTIN) 100 MG capsule Take 100 mg by mouth at bedtime.   Yes Historical Provider, MD  insulin NPH (HUMULIN N,NOVOLIN N) 100 UNIT/ML injection Inject 80-100 Units into the skin 2 (two) times daily. Patient injects 100 units in the morning and 80 units at night.    Yes Historical Provider, MD  insulin regular (HUMULIN R,NOVOLIN R) 100 units/mL injection Inject 30-50 Units into the skin 3 (three) times daily. Patient injects 50 units in the morning then injects between 30 to 50 units at lunch depending on levels, and at night she injects 50 units.    Yes Historical Provider, MD  LORazepam (ATIVAN) 1 MG tablet Take 1 mg by mouth at bedtime.   Yes Historical Provider, MD  metolazone (ZAROXOLYN) 5 MG tablet Take 5 mg by mouth every morning.   Yes Historical Provider, MD  propranolol ER (INDERAL LA) 120 MG 24 hr capsule Take 120 mg by mouth every morning.   Yes Historical Provider, MD    ALLERGIES:  Allergies  Allergen Reactions  . Benicar [Olmesartan] Swelling  . Codeine Other (See Comments)    Confusion   . Sulfa Antibiotics Swelling    Whole face swells    SOCIAL HISTORY:  History  Substance Use Topics  . Smoking status: Never Smoker   . Smokeless tobacco: Never Used  . Alcohol Use: No    FAMILY HISTORY: Family History  Problem Relation Age of Onset  . Stroke Mother   . Diabetes  Father   . Heart failure Father   . Hypertension Father   . Diabetes Sister   . Heart failure Sister   . Hypertension Sister   . Cancer Other     EXAM: BP 147/69  Pulse 66  Temp(Src) 98.5 F (36.9 C) (Oral)  Resp 18  Ht 5\' 1"  (1.549 m)  Wt 300 lb (136.079 kg)  BMI 56.71 kg/m2  SpO2 96%  LMP 08/06/2012 CONSTITUTIONAL: Alert and oriented and responds appropriately to questions. Well-appearing; well-nourished HEAD: Normocephalic EYES: Conjunctivae clear, PERRL ENT: normal nose; no rhinorrhea; moist mucous membranes; pharynx without lesions noted NECK: Supple, no  meningismus, no LAD  CARD: RRR; S1 and S2 appreciated; no murmurs, no clicks, no rubs, no gallops RESP: Normal chest excursion without splinting or tachypnea; breath sounds clear and equal bilaterally; no wheezes, no rhonchi, no rales,  ABD/GI: Normal bowel sounds; non-distended; soft, non-tender, no rebound, no guarding BACK:  The back appears normal and is non-tender to palpation, there is no CVA tenderness EXT: Normal ROM in all joints; non-tender to palpation; no edema; normal capillary refill; no cyanosis    SKIN: Normal color for age and race; warm NEURO: Moves all extremities equally PSYCH: The patient's mood and manner are appropriate. Grooming and personal hygiene are appropriate.  MEDICAL DECISION MAKING: Patient here with generalized weakness, fatigue and dysuria. She was hypoglycemic this morning but this resolves with eating. We'll check basic labs to evaluate for anemia or electrolyte abnormality, urinalysis to evaluate for infection. We'll give IV fluids.  ED PROGRESS: Patient's potassium is 2.4. She is on Lasix. Not on a potassium supplement. Will replace. Will order EKG and place on cardiac monitor.   EKG shows ST flattening, QT prolongation. Discussed with Dr. Nehemiah Settle for admission given she is having symptomatic hypokalemia.     EKG Interpretation  Date/Time:  Monday July 22 2014 20:58:57 EDT Ventricular Rate:  64 PR Interval:  128 QRS Duration: 106 QT Interval:  630 QTC Calculation: 650 R Axis:   -23 Text Interpretation:  Sinus rhythm Borderline left axis deviation Low voltage, precordial leads Borderline repolarization abnormality Prolonged QT interval Baseline wander in lead(s) I aVL V2 Confirmed by Arseniy Toomey,  DO, Diamonique Ruedas ([id number]) on 07/22/2014 9:21:18 PM        [place], DO 07/22/14 2121

## 2014-07-22 NOTE — ED Notes (Signed)
CRITICAL VALUE ALERT  Critical value received: potassium 2.4  Date of notification:    Time of notification:  07/22/14  Critical value read back:yes  Nurse who received alert:  P.Eldin Bonsell,   MD notified (1st page):   2039 Time of first page:    MD notified (2nd page):  Time of second page:  Responding MD:  ward  Time MD responded:  2039

## 2014-07-22 NOTE — ED Notes (Signed)
Pt reports fatigue, and pelvic pain and burning with urination.

## 2014-07-23 DIAGNOSIS — I10 Essential (primary) hypertension: Secondary | ICD-10-CM

## 2014-07-23 DIAGNOSIS — R5381 Other malaise: Secondary | ICD-10-CM

## 2014-07-23 DIAGNOSIS — R5383 Other fatigue: Principal | ICD-10-CM

## 2014-07-23 DIAGNOSIS — R002 Palpitations: Secondary | ICD-10-CM | POA: Diagnosis present

## 2014-07-23 DIAGNOSIS — R531 Weakness: Secondary | ICD-10-CM | POA: Diagnosis present

## 2014-07-23 DIAGNOSIS — J449 Chronic obstructive pulmonary disease, unspecified: Secondary | ICD-10-CM

## 2014-07-23 LAB — BASIC METABOLIC PANEL
Anion gap: 13 (ref 5–15)
BUN: 15 mg/dL (ref 6–23)
CO2: 31 mEq/L (ref 19–32)
Calcium: 8 mg/dL — ABNORMAL LOW (ref 8.4–10.5)
Chloride: 99 mEq/L (ref 96–112)
Creatinine, Ser: 0.82 mg/dL (ref 0.50–1.10)
GFR calc Af Amer: >90 mL/min (ref >90)
GFR calc non Af Amer: 80 mL/min — ABNORMAL LOW (ref >90)
Glucose, Bld: 178 mg/dL — ABNORMAL HIGH (ref 70–99)
Potassium: 2.7 mEq/L — CL (ref 3.7–5.3)
Sodium: 143 mEq/L (ref 137–147)

## 2014-07-23 LAB — POTASSIUM: Potassium: 3.5 mEq/L — ABNORMAL LOW (ref 3.7–5.3)

## 2014-07-23 LAB — GLUCOSE, CAPILLARY: Glucose-Capillary: 265 mg/dL — ABNORMAL HIGH (ref 70–99)

## 2014-07-23 MED ORDER — SODIUM CHLORIDE 0.45 % IV SOLN
INTRAVENOUS | Status: DC
  Administered 2014-07-23: 11:00:00 via INTRAVENOUS

## 2014-07-23 MED ORDER — INSULIN NPH (HUMAN) (ISOPHANE) 100 UNIT/ML ~~LOC~~ SUSP
SUBCUTANEOUS | Status: AC
  Filled 2014-07-23: qty 10

## 2014-07-23 MED ORDER — POTASSIUM CHLORIDE 10 MEQ/100ML IV SOLN
10.0000 meq | INTRAVENOUS | Status: AC
  Administered 2014-07-23 (×4): 10 meq via INTRAVENOUS
  Filled 2014-07-23 (×3): qty 100

## 2014-07-23 MED ORDER — POTASSIUM CHLORIDE CRYS ER 20 MEQ PO TBCR: 40.0000 meq | EXTENDED_RELEASE_TABLET | Freq: Two times a day (BID) | ORAL | Status: DC

## 2014-07-23 MED ORDER — POTASSIUM CHLORIDE 10 MEQ/100ML IV SOLN
10.0000 meq | INTRAVENOUS | Status: AC
  Administered 2014-07-23 (×3): 10 meq via INTRAVENOUS
  Filled 2014-07-23 (×2): qty 100

## 2014-07-23 MED ORDER — POTASSIUM CHLORIDE CRYS ER 20 MEQ PO TBCR
40.0000 meq | EXTENDED_RELEASE_TABLET | Freq: Three times a day (TID) | ORAL | Status: DC
  Administered 2014-07-23 (×2): 40 meq via ORAL
  Filled 2014-07-23 (×2): qty 2

## 2014-07-23 NOTE — Progress Notes (Signed)
Pt concerned that she is going to be taking too much Potassium IV. MD made aware. Will continue to monitor.

## 2014-07-23 NOTE — Progress Notes (Signed)
UR completed 

## 2014-07-23 NOTE — Progress Notes (Signed)
07/23/14 for discharge at 1737. Discharge instructions were discussed with patient. Removed IV and transported patient via wheelchair in NAD.

## 2014-07-23 NOTE — Discharge Summary (Signed)
Physician Discharge Summary  [name] PNT:[id number] DOB: [birth year] DOA: 07/22/2014  PCP: Sherrie Mustache, MD  Admit date: 07/22/2014 Discharge date: 07/23/2014  Time spent: 40 minutes  Recommendations for Outpatient Follow-up:  1. Follow up with primary care physician in 1-2 weeks with repeat potassium level  Discharge Diagnoses:  Principal Problem:   Hypokalemia Active Problems:   Diabetes mellitus   Hypertension   COPD (chronic obstructive pulmonary disease)   Palpitations   Generalized weakness   Discharge Condition: improved  Diet recommendation: low salt, low carb  Filed Weights   07/22/14 1727 07/22/14 2337  Weight: 136.079 kg (300 lb) 131.8 kg (290 lb 9.1 oz)    History of present illness and hospital course:  This patient was admitted to the hospital with generalized weakness and palpitations. She was noted to be significantly hypokalemic in the emergency room with a serum potassium level 2.4. Patient chronically takes Lasix as well as Zaroxolyn. She reported that she was taking potassium supplementation at home, but are now this some time ago. Her serum potassium was aggressively replaced. Serum magnesium was noted to be 1.6. Once her potassium levels improved to, the patient felt symptomatically better and was ready for discharge home. She was given a prescription for potassium supplementation and has been asked to followup with her primary care physician in one to 2 weeks for repeat potassium levels.  Procedures:    Consultations:    Discharge Exam: Filed Vitals:   07/23/14 0731  BP:   Pulse: 66  Temp:   Resp: 18    General: NAD Cardiovascular: S1, S2 RRR Respiratory: CTA B  Discharge Instructions You were cared for by a hospitalist during your hospital stay. If you have any questions about your discharge medications or the care you received while you were in the hospital after you are discharged, you can call the unit and asked to  speak with the hospitalist on call if the hospitalist that took care of you is not available. Once you are discharged, your primary care physician will handle any further medical issues. Please note that NO REFILLS for any discharge medications will be authorized once you are discharged, as it is imperative that you return to your primary care physician (or establish a relationship with a primary care physician if you do not have one) for your aftercare needs so that they can reassess your need for medications and monitor your lab values.      Discharge Instructions   Call MD for:  difficulty breathing, headache or visual disturbances    Complete by:  As directed      Call MD for:  extreme fatigue    Complete by:  As directed      Call MD for:  persistant dizziness or light-headedness    Complete by:  As directed      Diet - low sodium heart healthy    Complete by:  As directed      Diet Carb Modified    Complete by:  As directed      Increase activity slowly    Complete by:  As directed             Medication List         amLODipine 5 MG tablet  Commonly known as:  NORVASC  Take 5 mg by mouth every evening.     escitalopram 20 MG tablet  Commonly known as:  LEXAPRO  Take 20 mg by mouth every evening.  esomeprazole 20 MG capsule  Commonly known as:  NEXIUM  Take 20 mg by mouth daily at 12 noon.     furosemide 40 MG tablet  Commonly known as:  LASIX  Take 40 mg by mouth 2 (two) times daily.     gabapentin 100 MG capsule  Commonly known as:  NEURONTIN  Take 100 mg by mouth at bedtime.     insulin NPH Human 100 UNIT/ML injection  Commonly known as:  HUMULIN N,NOVOLIN N  Inject 80-100 Units into the skin 2 (two) times daily. Patient injects 100 units in the morning and 80 units at night.     insulin regular 100 units/mL injection  Commonly known as:  NOVOLIN R,HUMULIN R  Inject 30-50 Units into the skin 3 (three) times daily. Patient injects 50 units in the morning  then injects between 30 to 50 units at lunch depending on levels, and at night she injects 50 units.     LORazepam 1 MG tablet  Commonly known as:  ATIVAN  Take 1 mg by mouth at bedtime.     metolazone 5 MG tablet  Commonly known as:  ZAROXOLYN  Take 5 mg by mouth every morning.     potassium chloride SA 20 MEQ tablet  Commonly known as:  K-DUR,KLOR-CON  Take 2 tablets (40 mEq total) by mouth 2 (two) times daily.     PROAIR HFA 108 (90 BASE) MCG/ACT inhaler  Generic drug:  albuterol  Inhale 2 puffs into the lungs every 6 (six) hours as needed for wheezing or shortness of breath.     propranolol ER 120 MG 24 hr capsule  Commonly known as:  INDERAL LA  Take 120 mg by mouth every morning.       Allergies  Allergen Reactions  . Benicar [Olmesartan] Swelling  . Codeine Other (See Comments)    Confusion   . Sulfa Antibiotics Swelling    Whole face swells   Follow-up Information   Follow up with Sherrie Mustache, MD. (in one week to get your potassium checked)    Specialty:  Family Medicine   Contact information:   [place] [address] [phone]        The results of significant diagnostics from this hospitalization (including imaging, microbiology, ancillary and laboratory) are listed below for reference.    Significant Diagnostic Studies: No results found.  Microbiology: No results found for this or any previous visit (from the past 240 hour(s)).   Labs: Basic Metabolic Panel:  Recent Labs Lab 07/22/14 1927 07/22/14 2150 07/23/14 0512 07/23/14 1408  NA 141  --  143  --   K 2.4*  --  2.7* 3.5*  CL 91*  --  99  --   CO2 36*  --  31  --   GLUCOSE 144*  --  178*  --   BUN 21  --  15  --   CREATININE 1.02  --  0.82  --   CALCIUM 9.4  --  8.0*  --   MG  --  1.6  --   --    Liver Function Tests: No results found for this basename: AST, ALT, ALKPHOS, BILITOT, PROT, ALBUMIN,  in the last 168 hours No results found for this basename:  LIPASE, AMYLASE,  in the last 168 hours No results found for this basename: AMMONIA,  in the last 168 hours CBC:  Recent Labs Lab 07/22/14 1927  WBC 12.0*  NEUTROABS 8.3*  HGB 16.4*  HCT 46.2*  MCV 91.3  PLT 322   Cardiac Enzymes: No results found for this basename: CKTOTAL, CKMB, CKMBINDEX, TROPONINI,  in the last 168 hours BNP: BNP (last 3 results) No results found for this basename: PROBNP,  in the last 8760 hours CBG:  Recent Labs Lab 07/23/14 1216  GLUCAP 265*       Signed:  MEMON,JEHANZEB  [facility] 07/23/2014, 8:57 PM

## 2014-07-23 NOTE — Discharge Instructions (Signed)

## 2014-07-23 NOTE — Evaluation (Signed)
Physical Therapy Evaluation Patient Details Name: [name] MRN: [id number] DOB: 05-07-1961 Today's Date: 07/23/2014   History of Present Illness  KAELAH HAYASHI is a 53 y.o. female with insulin-dependent diabetes who presents to the hospital with 36 hours of palpitations and generalized weakness, which has been increasing over the course of the day. The patient's closure was low this morning and despite increasing her serum glucose levels, her weakness and palpitations remained.  Her palpitations occur 3 or 4 times  day and are in the form of skipped beats.   Clinical Impression  Pt is a 53 year old female who presents to physical therapy with dx of hypokalemia.  Pt reports she is (I) at home, though ambulation distance is limited to 5-6 minutes secondary to complaints of LBP and (B) LE pain during WB.  During evaluation, pt was mod (I) with bed mobility skill, transfers, and amb.  Educated pt on use of gait with RW, and verbally educated pt on techniques with rollator.  Pt works at [employer], and will be able to complete education with personal rollator with staff at facility.  No follow up PT recommended as pt is at baseline level of function and education completed.  Recommend use of bariatric rollator for gait for improved distance/decrease pain with WB.     Follow Up Recommendations No PT follow up    Equipment Recommendations   (Bariatric Rollator)       Precautions / Restrictions Precautions Precautions: Fall Restrictions Weight Bearing Restrictions: No      Mobility  Bed Mobility Overal bed mobility: Modified Independent                Transfers Overall transfer level: Modified independent Equipment used: Rolling walker (2 wheeled)                Ambulation/Gait Ambulation/Gait assistance: Modified independent (Device/Increase time);Supervision Ambulation Distance (Feet): 200 Feet Assistive device: Rolling walker (2 wheeled) Gait  Pattern/deviations: WFL(Within Functional Limits)   Gait velocity interpretation: Below normal speed for age/gender General Gait Details: Educated/demonstrated pt on technique for ambulation with use of RW, with verbal demonstration on use of rollator.       Balance Overall balance assessment: No apparent balance deficits (not formally assessed)                                           Pertinent Vitals/Pain Pain Assessment: No/denies pain    Home Living Family/patient expects to be discharged to:: Private residence Living Arrangements: Spouse/significant other;Children Available Help at Discharge: Family;Available 24 hours/day Type of Home: Mobile home Home Access: Stairs to enter Entrance Stairs-Rails: Can reach both Entrance Stairs-Number of Steps: 8 Home Layout: One level Home Equipment: Tub bench Additional Comments: Tub shower    Prior Function Level of Independence: Independent   Gait / Transfers Assistance Needed: Pt reports (I) with bed mobility skills, transfers, and amb.  Pt reprots she is able to amb 5-6 minutes prior to onset of pain requiring seated rest break.      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         Extremity/Trunk Assessment               Lower Extremity Assessment: Overall WFL for tasks assessed         Communication   Communication:  No difficulties  Cognition Arousal/Alertness: Awake/alert Behavior During Therapy: WFL for tasks assessed/performed Overall Cognitive Status: Within Functional Limits for tasks assessed                               Assessment/Plan    PT Assessment Patent does not need any further PT services  PT Diagnosis     PT Problem List    PT Treatment Interventions     PT Goals (Current goals can be found in the Care Plan section) Acute Rehab PT Goals PT Goal Formulation: No goals set, d/c therapy     End of Session Equipment  Utilized During Treatment: Gait belt Activity Tolerance: Patient tolerated treatment well Patient left: in bed      Functional Assessment Tool Used: Clinical Judgement Functional Limitation: Mobility: Walking and moving around Mobility: Walking and Moving Around Current Status ([id number]): 0 percent impaired, limited or restricted Mobility: Walking and Moving Around Goal Status [phone]): 0 percent impaired, limited or restricted Mobility: Walking and Moving Around Discharge Status [phone]): 0 percent impaired, limited or restricted    Time: 1505-1520 PT Time Calculation (min): 15 min   Charges:   PT Evaluation $Initial PT Evaluation Tier I: 1 Procedure     PT G Codes:   Functional Assessment Tool Used: Clinical Judgement Functional Limitation: Mobility: Walking and moving around    [facility] 07/23/2014, 3:29 PM

## 2014-07-23 NOTE — Progress Notes (Signed)
CRITICAL VALUE ALERT  Critical value received:  Potassium 2.7  Date of notification:  07/23/14  Time of notification: 0620  Critical value read back:yes  Nurse who received alert:  Eugenie Birks  MD notified (1st page):  Nehemiah Settle  Time of first page:  0623  MD notified (2nd page):n/a  Time of second page:n/a  Responding MD:  Nehemiah Settle  Time MD responded: [phone]

## 2014-07-23 NOTE — Care Management Note (Addendum)
    Page 1 of 1   07/23/2014     3:32:56 PM CARE MANAGEMENT NOTE 07/23/2014  Patient:  [name], [name]   Account Number:  [id number]  Date Initiated:  07/23/2014  Documentation initiated by:  Jolene Provost  Subjective/Objective Assessment:   Patient is from home with family. Patient independent at home but states she is becoming increasingly weaker because of her peripheral neuropathy.     Action/Plan:   Patient plans to discharge home with self care. PT will do evaluation prior to discharge. Patient will be ordered a walker with a seat.   Anticipated DC Date:  07/23/2014   Anticipated DC Plan:  HOME/SELF CARE      DC Planning Services  CM consult      PAC Choice  DURABLE MEDICAL EQUIPMENT   Choice offered to / List presented to:  C-1 Patient   DME arranged  Vassie Moselle      DME agency  [facility].        Status of service:  Completed, signed off Medicare Important Message given?   (If response is "NO", the following Medicare IM given date fields will be blank) Date Medicare IM given:   Medicare IM given by:   Date Additional Medicare IM given:   Additional Medicare IM given by:    Discharge Disposition:  HOME/SELF CARE  Per UR Regulation:    If discussed at Long Length of Stay Meetings, dates discussed:    Comments:  07/23/2014 [place], RN, MSN, [facility] PT recommends pt to have rolling walker with seat. Pt requests DME come from [facility]. Emma, with [facility], has been notified and will deliver device. Patient to be discharged home within 24 hours. No further CM needs at this time.  07/23/2014 [place], RN, MSN, [employer]

## 2014-10-07 ENCOUNTER — Encounter (HOSPITAL_COMMUNITY): Payer: Self-pay | Admitting: Emergency Medicine

## 2015-05-14 ENCOUNTER — Other Ambulatory Visit (HOSPITAL_COMMUNITY): Payer: Self-pay

## 2015-05-14 ENCOUNTER — Emergency Department (HOSPITAL_COMMUNITY): Payer: PRIVATE HEALTH INSURANCE

## 2015-05-14 ENCOUNTER — Encounter (HOSPITAL_COMMUNITY): Payer: Self-pay | Admitting: Emergency Medicine

## 2015-05-14 ENCOUNTER — Observation Stay (HOSPITAL_COMMUNITY)
Admission: EM | Admit: 2015-05-14 | Discharge: 2015-05-16 | Disposition: A | Discharge status: Home / Self Care | Payer: PRIVATE HEALTH INSURANCE | Attending: Family Medicine | Admitting: Family Medicine

## 2015-05-14 DIAGNOSIS — F329 Major depressive disorder, single episode, unspecified: Secondary | ICD-10-CM | POA: Diagnosis not present

## 2015-05-14 DIAGNOSIS — I10 Essential (primary) hypertension: Secondary | ICD-10-CM | POA: Insufficient documentation

## 2015-05-14 DIAGNOSIS — J449 Chronic obstructive pulmonary disease, unspecified: Secondary | ICD-10-CM | POA: Diagnosis present

## 2015-05-14 DIAGNOSIS — J441 Chronic obstructive pulmonary disease with (acute) exacerbation: Secondary | ICD-10-CM | POA: Diagnosis not present

## 2015-05-14 DIAGNOSIS — E119 Type 2 diabetes mellitus without complications: Secondary | ICD-10-CM

## 2015-05-14 DIAGNOSIS — R9431 Abnormal electrocardiogram [ECG] [EKG]: Secondary | ICD-10-CM

## 2015-05-14 DIAGNOSIS — R531 Weakness: Secondary | ICD-10-CM | POA: Diagnosis present

## 2015-05-14 DIAGNOSIS — E876 Hypokalemia: Principal | ICD-10-CM | POA: Diagnosis present

## 2015-05-14 DIAGNOSIS — Z79899 Other long term (current) drug therapy: Secondary | ICD-10-CM | POA: Diagnosis not present

## 2015-05-14 DIAGNOSIS — R002 Palpitations: Secondary | ICD-10-CM | POA: Insufficient documentation

## 2015-05-14 DIAGNOSIS — I4581 Long QT syndrome: Secondary | ICD-10-CM | POA: Diagnosis not present

## 2015-05-14 DIAGNOSIS — Z794 Long term (current) use of insulin: Secondary | ICD-10-CM | POA: Diagnosis not present

## 2015-05-14 DIAGNOSIS — R0602 Shortness of breath: Secondary | ICD-10-CM

## 2015-05-14 DIAGNOSIS — I519 Heart disease, unspecified: Secondary | ICD-10-CM | POA: Diagnosis not present

## 2015-05-14 DIAGNOSIS — I5189 Other ill-defined heart diseases: Secondary | ICD-10-CM

## 2015-05-14 HISTORY — DX: Other ill-defined heart diseases: I51.89

## 2015-05-14 HISTORY — DX: Abnormal electrocardiogram (ECG) (EKG): R94.31

## 2015-05-14 LAB — CBC
HCT: 44.9 % (ref 36.0–46.0)
Hemoglobin: 15.7 g/dL — ABNORMAL HIGH (ref 12.0–15.0)
MCH: 32.2 pg (ref 26.0–34.0)
MCHC: 35 g/dL (ref 30.0–36.0)
MCV: 92.2 fL (ref 78.0–100.0)
Platelets: 317 10*3/uL (ref 150–400)
RBC: 4.87 MIL/uL (ref 3.87–5.11)
RDW: 13.2 % (ref 11.5–15.5)
WBC: 10.7 10*3/uL — ABNORMAL HIGH (ref 4.0–10.5)

## 2015-05-14 LAB — BASIC METABOLIC PANEL
Anion gap: 13 (ref 5–15)
BUN: 16 mg/dL (ref 6–20)
CO2: 33 mmol/L — ABNORMAL HIGH (ref 22–32)
Calcium: 8.9 mg/dL (ref 8.9–10.3)
Chloride: 90 mmol/L — ABNORMAL LOW (ref 101–111)
Creatinine, Ser: 0.95 mg/dL (ref 0.44–1.00)
GFR calc Af Amer: >60 mL/min (ref >60)
GFR calc non Af Amer: >60 mL/min (ref >60)
Glucose, Bld: 301 mg/dL — ABNORMAL HIGH (ref 65–99)
Potassium: 2.7 mmol/L — CL (ref 3.5–5.1)
Sodium: 136 mmol/L (ref 135–145)

## 2015-05-14 LAB — MAGNESIUM: Magnesium: 1.7 mg/dL (ref 1.7–2.4)

## 2015-05-14 LAB — TROPONIN I: Troponin I: <0.03 ng/mL (ref <0.031)

## 2015-05-14 LAB — GLUCOSE, CAPILLARY: Glucose-Capillary: 181 mg/dL — ABNORMAL HIGH (ref 65–99)

## 2015-05-14 LAB — BRAIN NATRIURETIC PEPTIDE: B Natriuretic Peptide: 24 pg/mL (ref 0.0–100.0)

## 2015-05-14 IMAGING — DX DG CHEST 2V
2 series · 2 of 2 positions shown · non-contrast
Comparison: [DATE].

CLINICAL DATA: Weakness, nausea and shortness of breath for 4 days.

EXAM:
CHEST  2 VIEW

[chest pa]
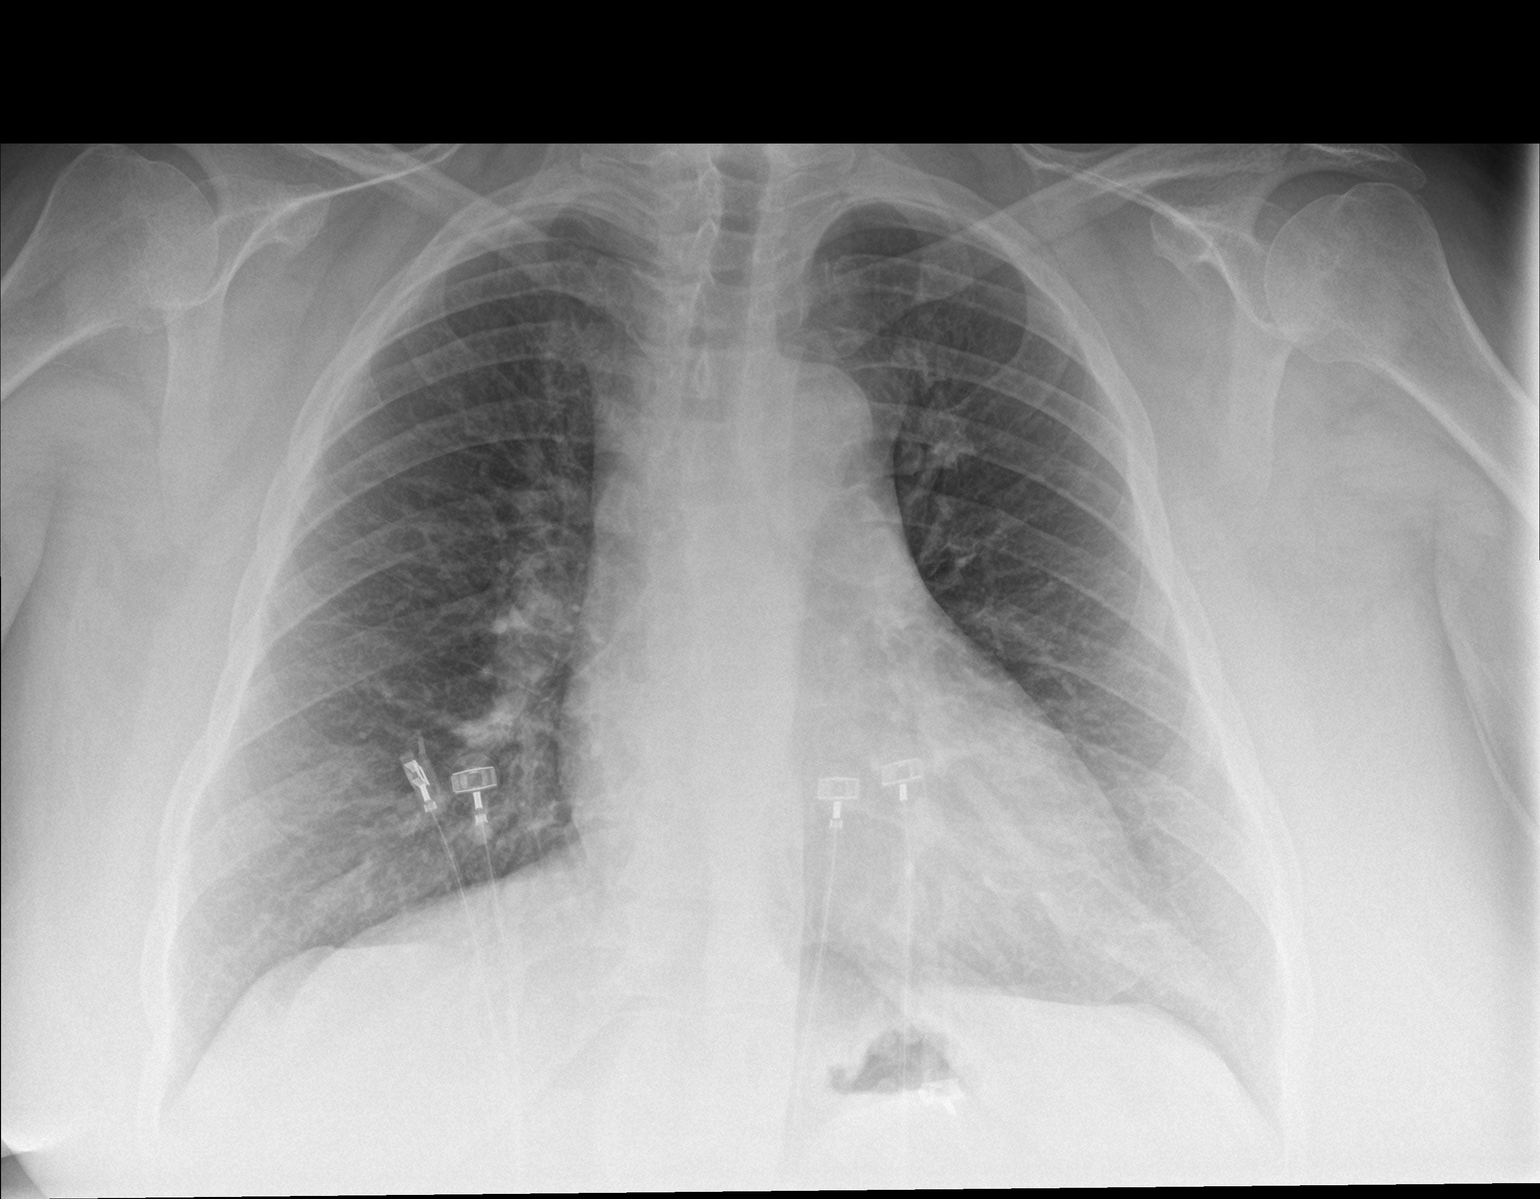

[chest lat]
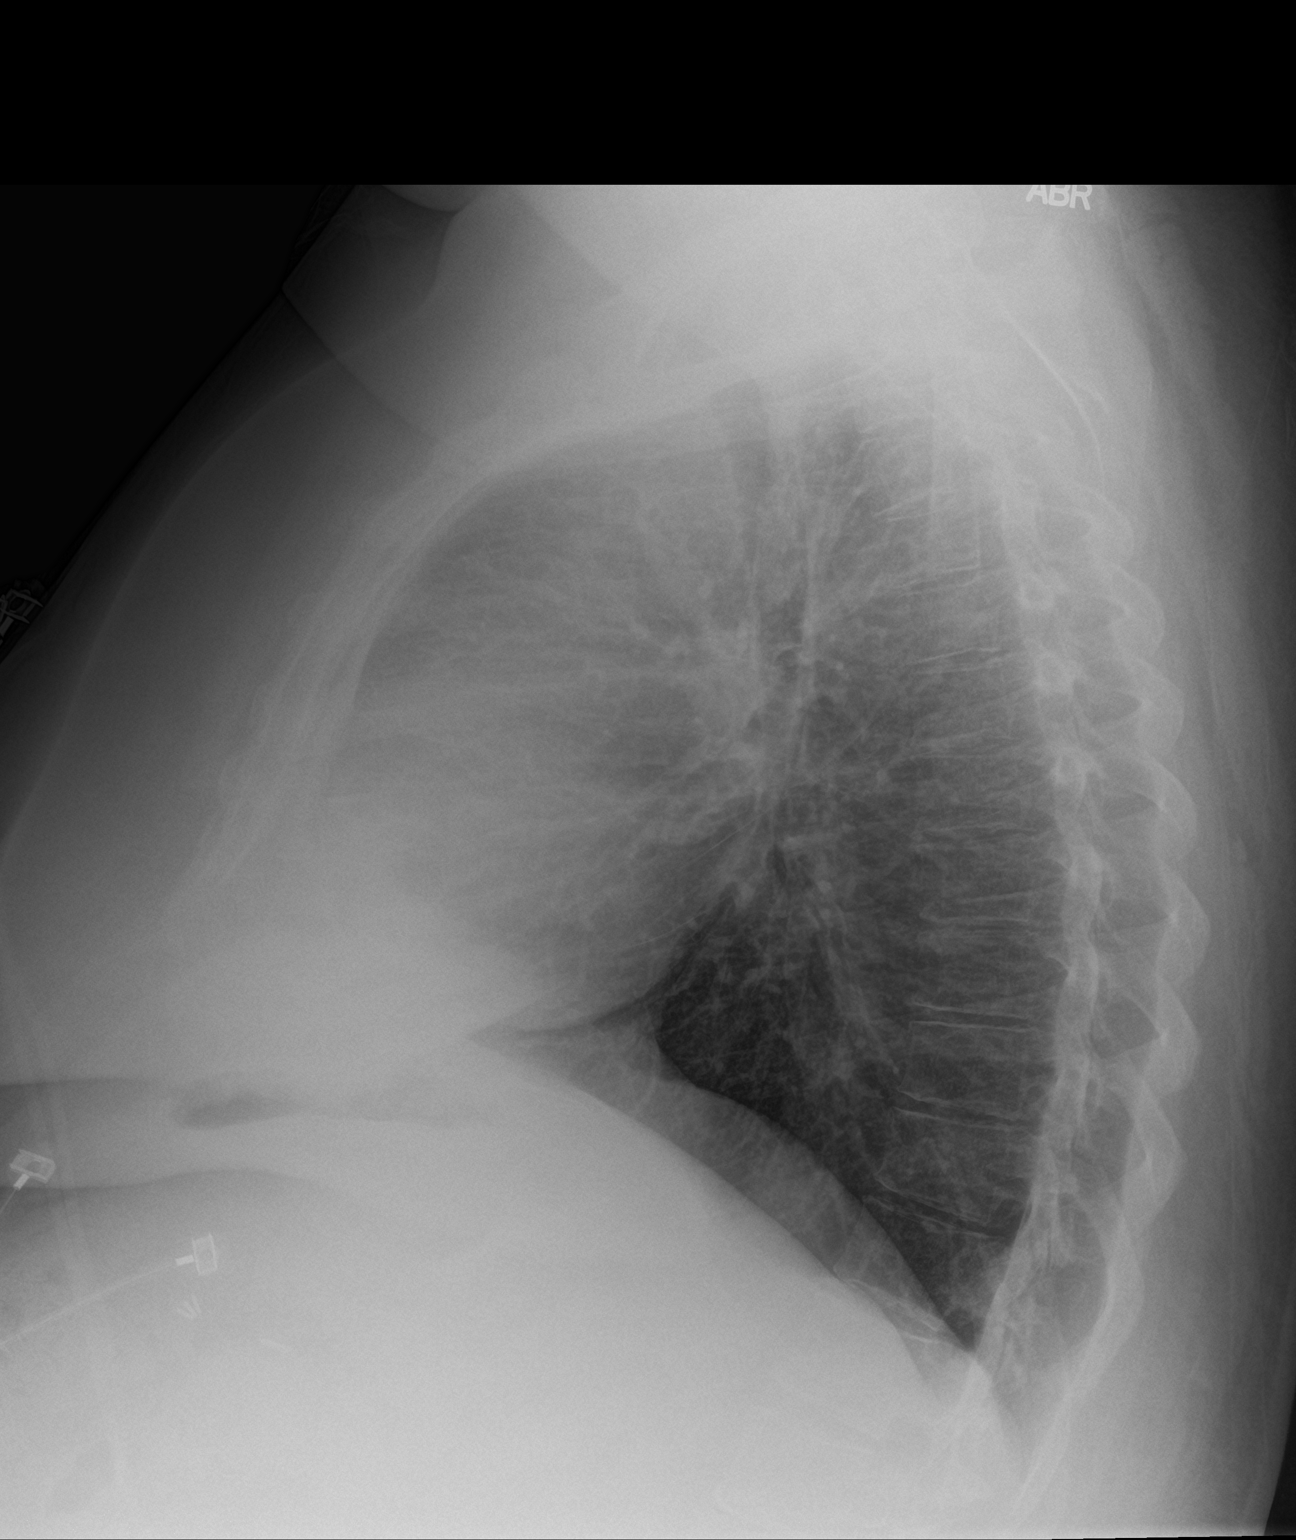

[2 of 2 positions shown; findings below may reference images not displayed]

FINDINGS: Trachea is midline. Heart size normal. Lungs are clear. No pleural
fluid.
IMPRESSION: No acute findings.

## 2015-05-14 MED ORDER — INSULIN NPH (HUMAN) (ISOPHANE) 100 UNIT/ML ~~LOC~~ SUSP
SUBCUTANEOUS | Status: AC
  Filled 2015-05-14: qty 10

## 2015-05-14 MED ORDER — SODIUM CHLORIDE 0.9 % IV SOLN: 250.0000 mL | INTRAVENOUS | Status: DC | PRN

## 2015-05-14 MED ORDER — POTASSIUM CHLORIDE 10 MEQ/100ML IV SOLN
10.0000 meq | INTRAVENOUS | Status: AC
  Administered 2015-05-14 (×3): 10 meq via INTRAVENOUS
  Filled 2015-05-14 (×2): qty 100

## 2015-05-14 MED ORDER — SODIUM CHLORIDE 0.9 % IJ SOLN: 3.0000 mL | INTRAMUSCULAR | Status: DC | PRN

## 2015-05-14 MED ORDER — INSULIN ASPART 100 UNIT/ML ~~LOC~~ SOLN
25.0000 [IU] | Freq: Three times a day (TID) | SUBCUTANEOUS | Status: DC
  Administered 2015-05-15 – 2015-05-16 (×5): 25 [IU] via SUBCUTANEOUS

## 2015-05-14 MED ORDER — SODIUM CHLORIDE 0.9 % IJ SOLN
3.0000 mL | Freq: Two times a day (BID) | INTRAMUSCULAR | Status: DC
  Administered 2015-05-15 – 2015-05-16 (×2): 3 mL via INTRAVENOUS

## 2015-05-14 MED ORDER — POTASSIUM CHLORIDE CRYS ER 20 MEQ PO TBCR
40.0000 meq | EXTENDED_RELEASE_TABLET | Freq: Once | ORAL | Status: AC
  Administered 2015-05-14: 40 meq via ORAL
  Filled 2015-05-14: qty 2

## 2015-05-14 MED ORDER — PROPRANOLOL HCL ER 120 MG PO CP24
120.0000 mg | ORAL_CAPSULE | Freq: Every morning | ORAL | Status: DC
  Administered 2015-05-15 – 2015-05-16 (×2): 120 mg via ORAL
  Filled 2015-05-14 (×5): qty 1

## 2015-05-14 MED ORDER — PANTOPRAZOLE SODIUM 40 MG PO TBEC
40.0000 mg | DELAYED_RELEASE_TABLET | Freq: Every day | ORAL | Status: DC
  Administered 2015-05-14 – 2015-05-16 (×3): 40 mg via ORAL
  Filled 2015-05-14 (×3): qty 1

## 2015-05-14 MED ORDER — ESCITALOPRAM OXALATE 10 MG PO TABS
20.0000 mg | ORAL_TABLET | Freq: Every evening | ORAL | Status: DC
  Administered 2015-05-14 – 2015-05-15 (×2): 20 mg via ORAL
  Filled 2015-05-14 (×2): qty 2

## 2015-05-14 MED ORDER — INSULIN NPH (HUMAN) (ISOPHANE) 100 UNIT/ML ~~LOC~~ SUSP
80.0000 [IU] | Freq: Two times a day (BID) | SUBCUTANEOUS | Status: DC
  Administered 2015-05-14 – 2015-05-16 (×4): 80 [IU] via SUBCUTANEOUS
  Filled 2015-05-14: qty 10

## 2015-05-14 MED ORDER — SODIUM CHLORIDE 0.9 % IJ SOLN
3.0000 mL | Freq: Two times a day (BID) | INTRAMUSCULAR | Status: DC
  Administered 2015-05-15: 3 mL via INTRAVENOUS

## 2015-05-14 MED ORDER — ALBUTEROL SULFATE (2.5 MG/3ML) 0.083% IN NEBU: 3.0000 mL | INHALATION_SOLUTION | Freq: Four times a day (QID) | RESPIRATORY_TRACT | Status: DC | PRN

## 2015-05-14 MED ORDER — ACETAMINOPHEN 650 MG RE SUPP: 650.0000 mg | Freq: Four times a day (QID) | RECTAL | Status: DC | PRN

## 2015-05-14 MED ORDER — POTASSIUM CHLORIDE 10 MEQ/100ML IV SOLN
10.0000 meq | INTRAVENOUS | Status: AC
Start: 2015-05-14 — End: 2015-05-15
  Administered 2015-05-14 (×2): 10 meq via INTRAVENOUS
  Filled 2015-05-14: qty 100

## 2015-05-14 MED ORDER — ACETAMINOPHEN 325 MG PO TABS: 650.0000 mg | ORAL_TABLET | Freq: Four times a day (QID) | ORAL | Status: DC | PRN

## 2015-05-14 NOTE — ED Notes (Signed)
CRITICAL VALUE ALERT  Critical value received:  Potassium 2.7  Date of notification:  05/14/15  Time of notification:  1620  Critical value read back:Yes.    Nurse who received alert:  Parthenia Ames  MD notified (1st page): Dr. Leonides Schanz  Time of first page:  1620  MD notified (2nd page):  Time of second page:  Responding MD:  Dr.Ward  Time MD responded:  1620

## 2015-05-14 NOTE — ED Provider Notes (Signed)
TIME SEEN: 4:30 PM  CHIEF COMPLAINT: Generalized weakness, dizziness, shortness of breath, palpitations  HPI: Pt is a 54 y.o. female with history of hypertension, diabetes, COPD who presents to the emergency department with generalized weakness, lightheadedness, shortness of breath and palpitations. No shortness of breath or palpitations currently. No chest pain. Symptoms started several days ago. States this feels similar to her episode of hypokalemia that she had in 2015 when she required admission. Denies any new numbness or focal weakness. No bowel or bladder incontinence. No headache. No vomiting or diarrhea. She is on Lasix. She states she does take potassium 20 mEq 3 times a day and has not missed any doses.  ROS: See HPI Constitutional: no fever  Eyes: no drainage  ENT: no runny nose   Cardiovascular:  no chest pain  Resp:  SOB  GI: no vomiting GU: no dysuria Integumentary: no rash  Allergy: no hives  Musculoskeletal: no leg swelling  Neurological: no slurred speech ROS otherwise negative  PAST MEDICAL HISTORY/PAST SURGICAL HISTORY:  Past Medical History  Diagnosis Date  . Diabetes mellitus   . Hypertension   . Asthma   . Depression   . COPD (chronic obstructive pulmonary disease)     MEDICATIONS:  Prior to Admission medications   Medication Sig Start Date End Date Taking? Authorizing Provider  escitalopram (LEXAPRO) 20 MG tablet Take 20 mg by mouth every evening.   Yes Historical Provider, MD  esomeprazole (NEXIUM) 20 MG capsule Take 20 mg by mouth daily at 12 noon.   Yes Historical Provider, MD  furosemide (LASIX) 40 MG tablet Take 40 mg by mouth 2 (two) times daily.   Yes Historical Provider, MD  metolazone (ZAROXOLYN) 5 MG tablet Take 5 mg by mouth every morning.   Yes Historical Provider, MD  potassium chloride SA (K-DUR,KLOR-CON) 20 MEQ tablet Take 2 tablets (40 mEq total) by mouth 2 (two) times daily. Patient taking differently: Take 20 mEq by mouth 3 (three)  times daily.  07/23/14  Yes Kathie Dike, MD  propranolol ER (INDERAL LA) 120 MG 24 hr capsule Take 120 mg by mouth every morning.   Yes Historical Provider, MD  albuterol (PROAIR HFA) 108 (90 BASE) MCG/ACT inhaler Inhale 2 puffs into the lungs every 6 (six) hours as needed for wheezing or shortness of breath.    Historical Provider, MD  insulin NPH (HUMULIN N,NOVOLIN N) 100 UNIT/ML injection Inject 80-100 Units into the skin 2 (two) times daily. Patient injects 100 units in the morning and 80 units at night.     Historical Provider, MD  insulin regular (HUMULIN R,NOVOLIN R) 100 units/mL injection Inject 30-50 Units into the skin 3 (three) times daily. Patient injects 50 units in the morning then injects between 30 to 50 units at lunch depending on levels, and at night she injects 50 units.    Historical Provider, MD  LORazepam (ATIVAN) 1 MG tablet Take 1 mg by mouth at bedtime.    Historical Provider, MD    ALLERGIES:  Allergies  Allergen Reactions  . Benicar [Olmesartan] Swelling  . Codeine Other (See Comments)    Confusion   . Sulfa Antibiotics Swelling    Whole face swells    SOCIAL HISTORY:  History  Substance Use Topics  . Smoking status: Never Smoker   . Smokeless tobacco: Never Used  . Alcohol Use: No    FAMILY HISTORY: Family History  Problem Relation Age of Onset  . Stroke Mother   . Diabetes Father   .  Heart failure Father   . Hypertension Father   . Diabetes Sister   . Heart failure Sister   . Hypertension Sister   . Cancer Other     EXAM: BP 155/67 mmHg  Pulse 72  Temp(Src) 97.8 F (36.6 C) (Oral)  Resp 18  Ht 5\' 1"  (1.549 m)  Wt 290 lb (131.543 kg)  BMI 54.82 kg/m2  SpO2 96%  LMP 08/06/2012 CONSTITUTIONAL: Alert and oriented and responds appropriately to questions. In no distress, obese HEAD: Normocephalic EYES: Conjunctivae clear, PERRL ENT: normal nose; no rhinorrhea; moist mucous membranes; pharynx without lesions noted NECK: Supple, no  meningismus, no LAD  CARD: RRR; S1 and S2 appreciated; no murmurs, no clicks, no rubs, no gallops RESP: Normal chest excursion without splinting or tachypnea; breath sounds clear and equal bilaterally; no wheezes, no rhonchi, no rales, no hypoxia or respiratory distress, speaking full sentences ABD/GI: Normal bowel sounds; non-distended; soft, non-tender, no rebound, no guarding, no peritoneal signs BACK:  The back appears normal and is non-tender to palpation, there is no CVA tenderness EXT: Normal ROM in all joints; non-tender to palpation; no edema; normal capillary refill; no cyanosis, no calf tenderness or swelling    SKIN: Normal color for age and race; warm NEURO: Moves all extremities equally, sensation to light touch intact diffusely, cranial nerves II through XII intact PSYCH: The patient's mood and manner are appropriate. Grooming and personal hygiene are appropriate.  MEDICAL DECISION MAKING: Patient here with symptomatic hypokalemia. Potassium today is 2.7 and her EKG does show a prolonged QTC.Marland Kitchen She is taking 60 mg of potassium at home. I feel she will need admission for IV potassium. We'll discuss with hospitalist. Her PCP is Dr. Edrick Oh.   Discussed with Dr. Sarajane Jews for admission to telemetry.       EKG Interpretation  Date/Time:  Wednesday May 14 2015 17:18:11 EDT Ventricular Rate:  65 PR Interval:  128 QRS Duration: 105 QT Interval:  541 QTC Calculation: 563 R Axis:   -35 Text Interpretation:  Sinus rhythm Left axis deviation Low voltage, precordial leads Borderline T abnormalities, diffuse leads Prolonged QT interval Confirmed by WARD,  DO, KRISTEN ([id number]) on 05/14/2015 5:25:44 PM        CRITICAL CARE Performed by: Nyra Jabs   Total critical care time: 35 minutes  Critical care time was exclusive of separately billable procedures and treating other patients.  Critical care was necessary to treat or prevent imminent or life-threatening  deterioration.  Critical care was time spent personally by me on the following activities: development of treatment plan with patient and/or surrogate as well as nursing, discussions with consultants, evaluation of patient's response to treatment, examination of patient, obtaining history from patient or surrogate, ordering and performing treatments and interventions, ordering and review of laboratory studies, ordering and review of radiographic studies, pulse oximetry and re-evaluation of patient's condition.     [place], DO 05/14/15 [phone]

## 2015-05-14 NOTE — ED Notes (Signed)
Pt reports generalized weakness,nausea x2 days.

## 2015-05-14 NOTE — H&P (Signed)
History and Physical  [name] WJX:[id number] DOB: 08/17/1961 DOA: 05/14/2015  Referring physician: Dr. Raliegh Ip Ward in ED PCP: Sherrie Mustache, MD   Chief Complaint: weak  HPI:  54 year old woman with history of chronic lower extremity edema maintained on Lasix and Zaroxolyn with history of recurrent hypokalemia, presented to the emergency department with generalized weakness for 2-3 days. Initial evaluation revealed hypokalemia and prolonged QT and the patient was referred for admission.  Patient reports the last few days she has felt generally weak, lightheaded and has had no energy. She has had no chest pain or shortness of breath, no systemic symptoms. No specific aggravating or alleviating factors are noted. She has been compliant with her potassium supplementation but did not take her diarrhetic stool today. She reports she has had similar problems with her potassium in the past.  Indeed review of her chart is notable for hospitalization August 2015 for hypokalemia. Of note her EKG at that time revealed a QTC of 650. Also noted is prolonged QTC 08/2011. She is not aware of having a prolonged QT. No EKG on file with normal QTC. 2-D echocardiogram on file from 2009 revealed normal left ventricular systolic function.  In the emergency department noted to be afebrile with stable vital signs. No hypoxia. Potassium 2.7. BNP and troponin are unremarkable. CBC and basic metabolic panel unremarkable otherwise EKG today showed sinus rhythm with nonspecific ST changes, compared to previous study these are old. Her QTC was again prolonged..  Review of Systems:  Negative for fever, visual changes, sore throat, rash, new muscle aches, chest pain, SOB, dysuria, bleeding, n/v/abdominal pain.  Past Medical History  Diagnosis Date  . Diabetes mellitus   . Hypertension   . Asthma   . Depression   . COPD (chronic obstructive pulmonary disease)     Past Surgical History  Procedure  Laterality Date  . Cesarean section    . Cholecystectomy    . Cataract extraction      Social History:  reports that she has never smoked. She has never used smokeless tobacco. She reports that she does not drink alcohol or use illicit drugs. lives with their spouse Self-care  Allergies  Allergen Reactions  . Benicar [Olmesartan] Swelling  . Codeine Other (See Comments)    Confusion   . Sulfa Antibiotics Swelling    Whole face swells    Family History  Problem Relation Age of Onset  . Stroke Mother   . Diabetes Father   . Heart failure Father   . Hypertension Father   . Diabetes Sister   . Heart failure Sister   . Hypertension Sister   . Cancer Other      Prior to Admission medications   Medication Sig Start Date End Date Taking? Authorizing Provider  albuterol (PROAIR HFA) 108 (90 BASE) MCG/ACT inhaler Inhale 2 puffs into the lungs every 6 (six) hours as needed for wheezing or shortness of breath.   Yes Historical Provider, MD  escitalopram (LEXAPRO) 20 MG tablet Take 20 mg by mouth every evening.   Yes Historical Provider, MD  esomeprazole (NEXIUM) 20 MG capsule Take 20 mg by mouth daily at 12 noon.   Yes Historical Provider, MD  furosemide (LASIX) 40 MG tablet Take 40 mg by mouth 2 (two) times daily.   Yes Historical Provider, MD  insulin NPH (HUMULIN N,NOVOLIN N) 100 UNIT/ML injection Inject 80-100 Units into the skin 2 (two) times daily. Patient injects 100 units in the morning and 80 units at  night.    Yes Historical Provider, MD  insulin regular (HUMULIN R,NOVOLIN R) 100 units/mL injection Inject 50 Units into the skin 3 (three) times daily.    Yes Historical Provider, MD  metolazone (ZAROXOLYN) 5 MG tablet Take 5 mg by mouth every morning.   Yes Historical Provider, MD  potassium chloride SA (K-DUR,KLOR-CON) 20 MEQ tablet Take 2 tablets (40 mEq total) by mouth 2 (two) times daily. Patient taking differently: Take 20 mEq by mouth 3 (three) times daily.  07/23/14  Yes  Kathie Dike, MD  propranolol ER (INDERAL LA) 120 MG 24 hr capsule Take 120 mg by mouth every morning.   Yes Historical Provider, MD   Physical Exam: Filed Vitals:   05/14/15 1504  BP: 155/67  Pulse: 72  Temp: 97.8 F (36.6 C)  TempSrc: Oral  Resp: 18  Height: 5\' 1"  (1.549 m)  Weight: 131.543 kg (290 lb)  SpO2: 96%     General: Examined in the emergency department. Appears calm and comfortable Eyes: PERRL, normal lids, irises  ENT: grossly normal hearing, lips  Neck: no LAD, masses or thyromegaly Cardiovascular: RRR, no m/r/g. 1+ bilateral LE edema. Respiratory: CTA bilaterally, no w/r/r. Normal respiratory effort. Abdomen: soft, ntnd Skin: Appears grossly unremarkable Musculoskeletal: grossly normal tone BUE/BLE Psychiatric: grossly normal mood and affect, speech fluent and appropriate Neurologic: grossly non-focal.  Wt Readings from Last 3 Encounters:  05/14/15 131.543 kg (290 lb)  07/22/14 131.8 kg (290 lb 9.1 oz)  01/03/14 130.636 kg (288 lb)    Labs on Admission:  Basic Metabolic Panel:  Recent Labs Lab 05/14/15 1536  NA 136  K 2.7*  CL 90*  CO2 33*  GLUCOSE 301*  BUN 16  CREATININE 0.95  CALCIUM 8.9    CBC:  Recent Labs Lab 05/14/15 1536  WBC 10.7*  HGB 15.7*  HCT 44.9  MCV 92.2  PLT 317    Cardiac Enzymes:  Recent Labs Lab 05/14/15 1536  TROPONINI <0.03   Radiological Exams on Admission: Dg Chest 2 View  05/14/2015   CLINICAL DATA:  Weakness, nausea and shortness of breath for 4 days.  EXAM: CHEST  2 VIEW  COMPARISON:  03/11/2015.  FINDINGS: Trachea is midline. Heart size normal. Lungs are clear. No pleural fluid.  IMPRESSION: No acute findings.   Electronically Signed   By: Lorin Picket M.D.   On: 05/14/2015 17:35    EKG: Independently reviewed. As in history of present illness   Principal Problem:   Hypokalemia Active Problems:   Diabetes mellitus   COPD (chronic obstructive pulmonary disease)   Prolonged QT  interval   Assessment/Plan 1. Symptomatic hypokalemia (lightheadedness, generalized weakness--no cardiac symptoms). Potassium 2.7. 2. Prolonged QT. This appears to be a chronic finding and is seen on EKG August 2015, September 2012. Of note QTc was over 600 August 2015 when she was admitted for hypokalemia. Not clear whether she has chronic prolonged QT or only when she has hypokalemia. 3. DM. Glucose 301. Anion gap 13. 4. COPD. Appears well controlled.   Plan to admit her to telemetry. Aggressively repeat potassium.  Check serum magnesium.  Telemetry, monitor for dysrhythmias.  Check EKG in the morning. If prolonged QT persists with normalization of potassium, will refer to cardiology as an outpatient.  Code Status: full code  DVT prophylaxis:SCDs Family Communication: none present, patient alert and understands plan Disposition Plan/Anticipated LOS: 60  Time spent: 50 minutes  Murray Hodgkins, MD  [facility] Pager [phone] 05/14/2015, 6:01 PM

## 2015-05-15 ENCOUNTER — Observation Stay (HOSPITAL_BASED_OUTPATIENT_CLINIC_OR_DEPARTMENT_OTHER): Payer: PRIVATE HEALTH INSURANCE

## 2015-05-15 ENCOUNTER — Encounter (HOSPITAL_COMMUNITY): Payer: Self-pay | Admitting: Family Medicine

## 2015-05-15 DIAGNOSIS — I4581 Long QT syndrome: Secondary | ICD-10-CM | POA: Diagnosis not present

## 2015-05-15 DIAGNOSIS — I519 Heart disease, unspecified: Secondary | ICD-10-CM

## 2015-05-15 DIAGNOSIS — E876 Hypokalemia: Secondary | ICD-10-CM | POA: Diagnosis not present

## 2015-05-15 DIAGNOSIS — R9431 Abnormal electrocardiogram [ECG] [EKG]: Secondary | ICD-10-CM

## 2015-05-15 DIAGNOSIS — I5189 Other ill-defined heart diseases: Secondary | ICD-10-CM

## 2015-05-15 HISTORY — DX: Other ill-defined heart diseases: I51.89

## 2015-05-15 LAB — GLUCOSE, CAPILLARY
Glucose-Capillary: 189 mg/dL — ABNORMAL HIGH (ref 65–99)
Glucose-Capillary: 245 mg/dL — ABNORMAL HIGH (ref 65–99)
Glucose-Capillary: 137 mg/dL — ABNORMAL HIGH (ref 65–99)

## 2015-05-15 LAB — URINALYSIS, ROUTINE W REFLEX MICROSCOPIC
Bilirubin Urine: NEGATIVE
Glucose, UA: NEGATIVE mg/dL
Hgb urine dipstick: NEGATIVE
Ketones, ur: NEGATIVE mg/dL
Leukocytes, UA: NEGATIVE
Nitrite: NEGATIVE
Protein, ur: 30 mg/dL — AB
Specific Gravity, Urine: 1.02 (ref 1.005–1.030)
Urobilinogen, UA: 0.2 mg/dL (ref 0.0–1.0)
pH: 6 (ref 5.0–8.0)

## 2015-05-15 LAB — BASIC METABOLIC PANEL
Anion gap: 8 (ref 5–15)
BUN: 14 mg/dL (ref 6–20)
CO2: 34 mmol/L — ABNORMAL HIGH (ref 22–32)
Calcium: 8.3 mg/dL — ABNORMAL LOW (ref 8.9–10.3)
Chloride: 98 mmol/L — ABNORMAL LOW (ref 101–111)
Creatinine, Ser: 0.74 mg/dL (ref 0.44–1.00)
GFR calc Af Amer: >60 mL/min (ref >60)
GFR calc non Af Amer: >60 mL/min (ref >60)
Glucose, Bld: 224 mg/dL — ABNORMAL HIGH (ref 65–99)
Potassium: 3 mmol/L — ABNORMAL LOW (ref 3.5–5.1)
Sodium: 140 mmol/L (ref 135–145)

## 2015-05-15 LAB — URINE MICROSCOPIC-ADD ON

## 2015-05-15 MED ORDER — POTASSIUM CHLORIDE CRYS ER 20 MEQ PO TBCR
40.0000 meq | EXTENDED_RELEASE_TABLET | Freq: Once | ORAL | Status: AC
  Administered 2015-05-15: 40 meq via ORAL
  Filled 2015-05-15: qty 2

## 2015-05-15 MED ORDER — MAGNESIUM SULFATE 2 GM/50ML IV SOLN
2.0000 g | Freq: Once | INTRAVENOUS | Status: AC
  Administered 2015-05-15: 2 g via INTRAVENOUS
  Filled 2015-05-15: qty 50

## 2015-05-15 MED ORDER — POTASSIUM CHLORIDE CRYS ER 20 MEQ PO TBCR
40.0000 meq | EXTENDED_RELEASE_TABLET | ORAL | Status: AC
  Administered 2015-05-15 (×2): 40 meq via ORAL
  Filled 2015-05-15 (×2): qty 2

## 2015-05-15 NOTE — Progress Notes (Signed)
PROGRESS NOTE  JOHNI NARINE YSA:[id number] DOB: [birth year] DOA: 05/14/2015 PCP: Sherrie Mustache, MD  Summary: 54 year old woman with history of chronic lower extremity edema maintained on Lasix and Zaroxolyn with history of recurrent hypokalemia, presented to the emergency department with generalized weakness for 2-3 days. Initial evaluation revealed hypokalemia and prolonged QT and the patient was referred for admission.  Assessment/Plan: 1. Symptomatic hypokalemia, resolved. Secondary to progressive diarrhetic therapy. 2. Prolonged QT. This has been seen on EKG August 2015, September 2012. Not clear whether this is acute or chronic. She has no history of syncope. No history to suggest arrhythmia. May need to discontinue Lexapro. 3. Diabetes mellitus, stable. 4. COPD. Stable. 5. Grade 2 diastolic dysfunction, chronic diastolic heart failure.  6. Chronic lower extremity edema secondary to diastolic dysfunction.   Continue aggressive potassium repletion. Magnesium repletion. Repeat EKG in the morning.  Anticipate adding Aldactone, changing to torsemide on discharge. Appreciate cardiology evaluation.  Anticipate discharge in AM  Code Status: full code DVT prophylaxis: SCDs Family Communication: none present, pt alert and understands plan Disposition Plan: home  Murray Hodgkins, MD  [facility]  Pager [phone] If 7PM-7AM, please contact night-coverage at [email or web address], password [facility] 05/15/2015, 5:23 PM    Consultants:  Cardiology   Procedures:  2d echo Study Conclusions  - Left ventricle: The cavity size was normal. Wall thickness was increased in a pattern of mild LVH. Systolic function was normal. The estimated ejection fraction was in the range of 60% to 65%. Wall motion was normal; there were no regional wall motion abnormalities. Features are consistent with a pseudonormal left ventricular filling pattern, with concomitant abnormal  relaxation and increased filling pressure (grade 2 diastolic dysfunction). - Aortic valve: Valve area (VTI): 3.43 cm^2. Valve area (Vmax): 2.94 cm^2. - Mitral valve: Mildly calcified annulus. Normal thickness leaflets . - Left atrium: The atrium was mildly to moderately dilated. - Atrial septum: No defect or patent foramen ovale was identified. - Technically difficult study. Echocontrast was used to enhance visualization.  Antibiotics:    HPI/Subjective: Feeling better. No lightheadedness. No history of syncope.  Objective: Filed Vitals:   05/14/15 2005 05/15/15 0610 05/15/15 1446 05/15/15 1622  BP:  136/54 142/60 136/65  Pulse:  73 64 62  Temp:  97.8 F (36.6 C) 97.6 F (36.4 C)   TempSrc:  Oral Oral   Resp:  20 20 20   Height: 5\' 1"  (1.549 m)     Weight: 132.496 kg (292 lb 1.6 oz)     SpO2:  94% 95% 95%    Intake/Output Summary (Last 24 hours) at 05/15/15 1723 Last data filed at 05/15/15 0500  Gross per 24 hour  Intake    240 ml  Output    350 ml  Net   -110 ml     Filed Weights   05/14/15 1504 05/14/15 2005  Weight: 131.543 kg (290 lb) 132.496 kg (292 lb 1.6 oz)    Exam:     Afebrile, vital signs stable. No hypoxia. General:  Appears calm and comfortable Cardiovascular: RRR, no m/r/g. No LE edema. Telemetry: SR, no arrhythmias  Respiratory: CTA bilaterally, no w/r/r. Normal respiratory effort. Psychiatric: grossly normal mood and affect, speech fluent and appropriate  Data reviewed:  Potassium 2.7 >> 3.0  Remainder of basic metabolic panel unremarkable  Magnesium 1.7 yesterday  Scheduled Meds: . escitalopram  20 mg Oral QPM  . insulin aspart  25 Units Subcutaneous TID WC  . insulin NPH Human  80 Units Subcutaneous BID AC &  HS  . pantoprazole  40 mg Oral Daily  . propranolol ER  120 mg Oral q morning - 10a  . sodium chloride  3 mL Intravenous Q12H  . sodium chloride  3 mL Intravenous Q12H   Continuous Infusions:   Principal  Problem:   Hypokalemia Active Problems:   Diabetes mellitus   COPD (chronic obstructive pulmonary disease)   Prolonged QT interval   Diastolic dysfunction   Time spent 20 minutes

## 2015-05-15 NOTE — Progress Notes (Signed)
Inpatient Diabetes Program Recommendations  AACE/ADA: New Consensus Statement on Inpatient Glycemic Control (2013)  Target Ranges:  Prepandial:   less than 140 mg/dL      Peak postprandial:   less than 180 mg/dL (1-2 hours)      Critically ill patients:  140 - 180 mg/dL   Results for Teresa, [name] (MRN [id number]) as of 05/15/2015 08:07  Ref. Range 05/14/2015 21:18 05/15/2015 07:50  Glucose-Capillary Latest Ref Range: 65-99 mg/dL 181 (H) 189 (H)   Diabetes history: DM2 Outpatient Diabetes medications: NPH 100 units QAM and 80 units QPM, Novolin R 50 units TID with meals Current orders for Inpatient glycemic control: NPH 80 units BId, Novolog 25 units TID with meals  Inpatient Diabetes Program Recommendations Correction (SSI): While inpatinet please consider ordering Novolog correction scale ACHS. HgbA1C: Please consider adding an A1C to blood in lab to evaluate glycemic control over the past 2-3 months.  Thanks, Barnie Alderman, RN, MSN, CCRN, CDE Diabetes Coordinator Inpatient Diabetes Program [phone] (Team Pager from [place] to [place]) [phone] ([place] office) [phone] [facility] office) [phone] [facility] office)

## 2015-05-15 NOTE — Progress Notes (Signed)
Consulting cardiologist: Dr Carlyle Dolly MD  Clinical Summary Ms. Uballe is a 54 y.o.female hx of chronic LE edema, DM, COPD admitted with generalized weakness and fatigue. She has had prior admittsions with weakness typically in the setting of hypokalemia, she is on lasix and metolazone at home. Represents this admission with similar symptoms, found to be hypokalemic with prolonged QTc    08/2008 Echo: LVEF not well visualized, normal VLEF Mg 1.7, BNP 24, K 2.7, Cr 0.95, Hgb 15.7, WBC 10.7, Plt 317  CXR no acute findings EKG SR, meaures QTc 511 Allergies  Allergen Reactions  . Benicar [Olmesartan] Swelling  . Codeine Other (See Comments)    Confusion   . Sulfa Antibiotics Swelling    Whole face swells    Medications Scheduled Medications: . escitalopram  20 mg Oral QPM  . insulin aspart  25 Units Subcutaneous TID WC  . insulin NPH Human  80 Units Subcutaneous BID AC & HS  . pantoprazole  40 mg Oral Daily  . propranolol ER  120 mg Oral q morning - 10a  . sodium chloride  3 mL Intravenous Q12H  . sodium chloride  3 mL Intravenous Q12H     Infusions:     PRN Medications:  sodium chloride, acetaminophen **OR** acetaminophen, albuterol, sodium chloride   Past Medical History  Diagnosis Date  . Diabetes mellitus   . Hypertension   . Asthma   . Depression   . COPD (chronic obstructive pulmonary disease)   . Prolonged QT interval 05/14/2015    Past Surgical History  Procedure Laterality Date  . Cesarean section    . Cholecystectomy    . Cataract extraction      Family History  Problem Relation Age of Onset  . Stroke Mother   . Diabetes Father   . Heart failure Father   . Hypertension Father   . Diabetes Sister   . Heart failure Sister   . Hypertension Sister   . Cancer Other     Social History Ms. Schofield reports that she has never smoked. She has never used smokeless tobacco. Ms. Faulconer reports that she does not drink  alcohol.  Review of Systems CONSTITUTIONAL: + fatigue HEENT: Eyes: No visual loss, blurred vision, double vision or yellow sclerae. No hearing loss, sneezing, congestion, runny nose or sore throat.  SKIN: No rash or itching.  CARDIOVASCULAR: No chest pain, chest pressure or chest discomfort. No palpitations or edema.  RESPIRATORY: No shortness of breath, cough or sputum.  GASTROINTESTINAL: No anorexia, nausea, vomiting or diarrhea. No abdominal pain or blood.  GENITOURINARY: no polyuria, no dysuria NEUROLOGICAL: No headache, dizziness, syncope, paralysis, ataxia, numbness or tingling in the extremities. No change in bowel or bladder control.  MUSCULOSKELETAL: No muscle, back pain, joint pain or stiffness.  HEMATOLOGIC: No anemia, bleeding or bruising.  LYMPHATICS: No enlarged nodes. No history of splenectomy.  PSYCHIATRIC: No history of depression or anxiety.      Physical Examination Blood pressure 136/54, pulse 73, temperature 97.8 F (36.6 C), temperature source Oral, resp. rate 20, height 5\' 1"  (1.549 m), weight 292 lb 1.6 oz (132.496 kg), last menstrual period 08/06/2012, SpO2 94 %.  Intake/Output Summary (Last 24 hours) at 05/15/15 1236 Last data filed at 05/15/15 0500  Gross per 24 hour  Intake    240 ml  Output    350 ml  Net   -110 ml     Cardiovascular: decreased heart sounds, RRR, no m/r/g  Respiratory: CTAB  GI: abdomen soft, NT, ND  MSK: no LE edema  Neuro: no focal deficits  Psych: appropriate affect   Lab Results  Basic Metabolic Panel:  Recent Labs Lab 05/14/15 1536 05/15/15 0616  NA 136 140  K 2.7* 3.0*  CL 90* 98*  CO2 33* 34*  GLUCOSE 301* 224*  BUN 16 14  CREATININE 0.95 0.74  CALCIUM 8.9 8.3*  MG 1.7  --     Liver Function Tests: No results for input(s): AST, ALT, ALKPHOS, BILITOT, PROT, ALBUMIN in the last 168 hours.  CBC:  Recent Labs Lab 05/14/15 1536  WBC 10.7*  HGB 15.7*  HCT 44.9  MCV 92.2  PLT 317    Cardiac  Enzymes:  Recent Labs Lab 05/14/15 1536  TROPONINI <0.03    BNP: Invalid input(s): POCBNP   Impression/Recommendations  1. Long QT - in general very flat T-waves that are difficult to measure - QTc this admit around 510, previous admit EKGs with QTc reportedly in 600s by compute was  530 ms by manual measure, her flat T-waves complicate measuremnts - currently hypokalemic due to home diuretics, repeat EKG once K normalized and will repeat manual measure. Mg is 1.7 - pending repeat measure may need to d/c lexapro.  2. LE edema - unclear etiology, f/u echo results - metolazone much stronger association with electrolyte abnormalities at loop diuretics, on discharge would change to toresmide 40mg  bid and change metolazone to prn only. Would also likely benefit from K sparing aldactone added to her regimen.  - appears euvolemic today, would continue to hold diuretics.      Carlyle Dolly, M.D.

## 2015-05-15 NOTE — Care Management Note (Signed)
Case Management Note  Patient Details  Name: [name] MRN: [id number] Date of Birth: [birth year]  Subjective/Objective:                  Pt admitted from home with hypokalemia. Pt lives with her husband and will return home at discharge. Pt is independent with ADL's.  Action/Plan: No CM needs noted. Anticipate discharge later today.  Expected Discharge Date:                  Expected Discharge Plan:  Home/Self Care  In-House Referral:  NA  Discharge planning Services  CM Consult  Post Acute Care Choice:  NA Choice offered to:  NA  DME Arranged:    DME Agency:     HH Arranged:    HH Agency:     Status of Service:  Completed, signed off  Medicare Important Message Given:  No Date Medicare IM Given:    Medicare IM give by:    Date Additional Medicare IM Given:    Additional Medicare Important Message give by:     If discussed at [place] of Stay Meetings, dates discussed:    Additional Comments:  Joylene Draft, RN 05/15/2015, 11:42 AM

## 2015-05-16 DIAGNOSIS — E119 Type 2 diabetes mellitus without complications: Secondary | ICD-10-CM | POA: Diagnosis not present

## 2015-05-16 DIAGNOSIS — I519 Heart disease, unspecified: Secondary | ICD-10-CM | POA: Diagnosis not present

## 2015-05-16 DIAGNOSIS — I4581 Long QT syndrome: Secondary | ICD-10-CM | POA: Diagnosis not present

## 2015-05-16 DIAGNOSIS — E876 Hypokalemia: Secondary | ICD-10-CM | POA: Diagnosis not present

## 2015-05-16 LAB — BASIC METABOLIC PANEL
Anion gap: 10 (ref 5–15)
BUN: 14 mg/dL (ref 6–20)
CO2: 31 mmol/L (ref 22–32)
Calcium: 8.7 mg/dL — ABNORMAL LOW (ref 8.9–10.3)
Chloride: 101 mmol/L (ref 101–111)
Creatinine, Ser: 0.65 mg/dL (ref 0.44–1.00)
GFR calc Af Amer: >60 mL/min (ref >60)
GFR calc non Af Amer: >60 mL/min (ref >60)
Glucose, Bld: 66 mg/dL (ref 65–99)
Potassium: 3.4 mmol/L — ABNORMAL LOW (ref 3.5–5.1)
Sodium: 142 mmol/L (ref 135–145)

## 2015-05-16 LAB — GLUCOSE, CAPILLARY
Glucose-Capillary: 68 mg/dL (ref 65–99)
Glucose-Capillary: 185 mg/dL — ABNORMAL HIGH (ref 65–99)

## 2015-05-16 LAB — MAGNESIUM: Magnesium: 2 mg/dL (ref 1.7–2.4)

## 2015-05-16 MED ORDER — POTASSIUM CHLORIDE CRYS ER 20 MEQ PO TBCR: 20.0000 meq | EXTENDED_RELEASE_TABLET | Freq: Two times a day (BID) | ORAL | Status: DC

## 2015-05-16 MED ORDER — POTASSIUM CHLORIDE CRYS ER 20 MEQ PO TBCR
40.0000 meq | EXTENDED_RELEASE_TABLET | ORAL | Status: AC
  Administered 2015-05-16 (×2): 40 meq via ORAL
  Filled 2015-05-16 (×2): qty 2

## 2015-05-16 MED ORDER — SPIRONOLACTONE 25 MG PO TABS: 25.0000 mg | ORAL_TABLET | Freq: Every day | ORAL | Status: DC

## 2015-05-16 MED ORDER — TORSEMIDE 20 MG PO TABS
20.0000 mg | ORAL_TABLET | Freq: Two times a day (BID) | ORAL | Status: DC
Start: 2015-05-16 — End: 2015-05-16
  Administered 2015-05-16: 20 mg via ORAL
  Filled 2015-05-16: qty 1

## 2015-05-16 MED ORDER — METOLAZONE 5 MG PO TABS: 5.0000 mg | ORAL_TABLET | Freq: Every day | ORAL | Status: DC | PRN

## 2015-05-16 MED ORDER — TORSEMIDE 20 MG PO TABS: 20.0000 mg | ORAL_TABLET | Freq: Two times a day (BID) | ORAL | Status: DC

## 2015-05-16 NOTE — Progress Notes (Signed)
Inpatient Diabetes Program Recommendations  AACE/ADA: New Consensus Statement on Inpatient Glycemic Control (2013)  Target Ranges:  Prepandial:   less than 140 mg/dL      Peak postprandial:   less than 180 mg/dL (1-2 hours)      Critically ill patients:  140 - 180 mg/dL   Results for [name], [name] (MRN [id number]) as of 05/16/2015 09:31  Ref. Range 05/15/2015 07:50 05/15/2015 11:26 05/15/2015 21:14 05/16/2015 07:52  Glucose-Capillary Latest Ref Range: 65-99 mg/dL 189 (H) 245 (H) 137 (H) 68   Diabetes history: DM2 Outpatient Diabetes medications: NPH 100 units QAM and 80 units QPM, Novolin R 50 units TID with meals Current orders for Inpatient glycemic control: NPH 80 units BID, Novolog 25 units TID with meals  Inpatient Diabetes Program Recommendations Basal: Noted fasting glucose of 68 mg/dl this morning. May want to consider decreasing evening NPH dose. Correction (SSI): While inpatient please consider ordering Novolog correction scale ACHS. HgbA1C: Please consider adding an A1C to blood in lab to evaluate glycemic control over the past 2-3 months.  Thanks, Barnie Alderman, RN, MSN, CCRN, CDE Diabetes Coordinator Inpatient Diabetes Program [phone] (Team Pager from [place] to [place]) [phone] (AP office) [phone] [facility] office) [phone] [facility] office)

## 2015-05-16 NOTE — Care Management Note (Signed)
Case Management Note  Patient Details  Name: LEONOR DARNELL MRN: [id number] Date of Birth: Feb 05, 1961  Subjective/Objective:                    Action/Plan:   Expected Discharge Date:                  Expected Discharge Plan:  Home/Self Care  In-House Referral:  NA  Discharge planning Services  CM Consult  Post Acute Care Choice:  NA Choice offered to:  NA  DME Arranged:    DME Agency:     HH Arranged:    HH Agency:     Status of Service:  Completed, signed off  Medicare Important Message Given:  No Date Medicare IM Given:    Medicare IM give by:    Date Additional Medicare IM Given:    Additional Medicare Important Message give by:     If discussed at [place] of Stay Meetings, dates discussed:    Additional Comments: Pt discharged home today. No CM needs noted. Christinia Gully [place], RN 05/16/2015, 12:28 PM

## 2015-05-16 NOTE — Progress Notes (Signed)
PROGRESS NOTE  [name] VPX:[id number] DOB: 1961-08-02 DOA: 05/14/2015 PCP: Sherrie Mustache, MD  Summary: 54 year old woman with history of chronic lower extremity edema maintained on Lasix and Zaroxolyn with history of recurrent hypokalemia, presented to the emergency department with generalized weakness for 2-3 days. Initial evaluation revealed hypokalemia and prolonged QT and the patient was referred for admission.  Assessment/Plan: 1. Symptomatic hypokalemia, resolved. Secondary to aggressive diuretic therapy. 2. Prolonged QT. Improved with K+ repletion, down to 450. This has been seen on EKG August 2015, September 2012. She has no history of syncope. No history to suggest arrhythmia.  3. Diabetes mellitus, stable. 4. COPD. Stable. 5. Grade 2 diastolic dysfunction, chronic diastolic heart failure.  6. Chronic lower extremity edema secondary to diastolic dysfunction.   Doing well, asymptomatic.   Replete potassium  Home today.  Stop Lasix and start torsemide 20mg  bid. Change metolazone to prn only 5 pound weight gain as metolazone much more likely to induce electrolyte abnormalities. Start aldactone 25mg  daily. Decrease potassium to 29mEq bid until follow up.   F/u with NP Purcell Nails in 1 week, will get BMET and Mg at that time as well.    Murray Hodgkins, MD  [facility]  Pager [phone] If 7PM-7AM, please contact night-coverage at [email or web address], password [facility] 05/16/2015, 11:09 AM    Consultants:  Cardiology   Procedures:  2d echo Study Conclusions  - Left ventricle: The cavity size was normal. Wall thickness was increased in a pattern of mild LVH. Systolic function was normal. The estimated ejection fraction was in the range of 60% to 65%. Wall motion was normal; there were no regional wall motion abnormalities. Features are consistent with a pseudonormal left ventricular filling pattern, with concomitant abnormal relaxation and  increased filling pressure (grade 2 diastolic dysfunction). - Aortic valve: Valve area (VTI): 3.43 cm^2. Valve area (Vmax): 2.94 cm^2. - Mitral valve: Mildly calcified annulus. Normal thickness leaflets . - Left atrium: The atrium was mildly to moderately dilated. - Atrial septum: No defect or patent foramen ovale was identified. - Technically difficult study. Echocontrast was used to enhance visualization.  Antibiotics:    HPI/Subjective: Feels good. No lightheadedness. Ready to go home.  Objective: Filed Vitals:   05/15/15 1622 05/15/15 2308 05/16/15 0306 05/16/15 0521  BP: 136/65 134/64  140/53  Pulse: 62 64  66  Temp:  98.2 F (36.8 C)  98.1 F (36.7 C)  TempSrc:  Oral  Oral  Resp: 20 20  18   Height:      Weight:      SpO2: 95% 95% 95% 97%    Intake/Output Summary (Last 24 hours) at 05/16/15 1109 Last data filed at 05/16/15 0900  Gross per 24 hour  Intake    243 ml  Output    400 ml  Net   -157 ml     Filed Weights   05/14/15 1504 05/14/15 2005  Weight: 131.543 kg (290 lb) 132.496 kg (292 lb 1.6 oz)    Exam:    Afebrile, VSS, no hypoxia. General:  Appears comfortable, calm. Cardiovascular: Regular rate and rhythm, no murmur, rub or gallop. No lower extremity edema. Telemetry: Sinus rhythm, no arrhythmias  Respiratory: Clear to auscultation bilaterally, no wheezes, rales or rhonchi. Normal respiratory effort. Psychiatric: grossly normal mood and affect, speech fluent and appropriate  Data reviewed:  Potassium 2.7 >> 3.0 >> 3.4  Remainder of basic metabolic panel unremarkable  Magnesium 2.0  Scheduled Meds: . escitalopram  20 mg Oral QPM  . insulin  aspart  25 Units Subcutaneous TID WC  . insulin NPH Human  80 Units Subcutaneous BID AC & HS  . pantoprazole  40 mg Oral Daily  . potassium chloride  40 mEq Oral Q4H  . propranolol ER  120 mg Oral q morning - 10a  . sodium chloride  3 mL Intravenous Q12H  . sodium chloride  3 mL Intravenous  Q12H  . torsemide  20 mg Oral BID   Continuous Infusions:   Principal Problem:   Hypokalemia Active Problems:   Diabetes mellitus   COPD (chronic obstructive pulmonary disease)   Prolonged QT interval   Diastolic dysfunction

## 2015-05-16 NOTE — Progress Notes (Signed)
Patient CBG 68. Dr. Sarajane Jews notified. Ordered to administer Novolog 25 units and NPH 80 units as scheduled.

## 2015-05-16 NOTE — Progress Notes (Signed)
Patient ID: [name], female   DOB: [birth year], 54 y.o.   MRN: [id number]     Subjective:    No complaints this AM  Objective:   Temp:  [97.6 F (36.4 C)-98.2 F (36.8 C)] 98.1 F (36.7 C) (06/10 0521) Pulse Rate:  [62-66] 66 (06/10 0521) Resp:  [18-20] 18 (06/10 0521) BP: (134-142)/(53-65) 140/53 mmHg (06/10 0521) SpO2:  [95 %-97 %] 97 % (06/10 0521) Last BM Date: 05/15/15  Filed Weights   05/14/15 1504 05/14/15 2005  Weight: 290 lb (131.543 kg) 292 lb 1.6 oz (132.496 kg)    Intake/Output Summary (Last 24 hours) at 05/16/15 0846 Last data filed at 05/15/15 2219  Gross per 24 hour  Intake      3 ml  Output    400 ml  Net   -397 ml    Exam:  General: NAD  Resp: CTAB  Cardiac: RRR, no m/r/g, no JVD  GI: abdomen soft, NT, ND  MSK: trace bilateral edema  Neuro: no focal deficits  Psych: appropriate affect  Lab Results:  Basic Metabolic Panel:  Recent Labs Lab 05/14/15 1536 05/15/15 0616 05/16/15 0603  NA 136 140 142  K 2.7* 3.0* 3.4*  CL 90* 98* 101  CO2 33* 34* 31  GLUCOSE 301* 224* 66  BUN 16 14 14   CREATININE 0.95 0.74 0.65  CALCIUM 8.9 8.3* 8.7*  MG 1.7  --  2.0    Liver Function Tests: No results for input(s): AST, ALT, ALKPHOS, BILITOT, PROT, ALBUMIN in the last 168 hours.  CBC:  Recent Labs Lab 05/14/15 1536  WBC 10.7*  HGB 15.7*  HCT 44.9  MCV 92.2  PLT 317    Cardiac Enzymes:  Recent Labs Lab 05/14/15 1536  TROPONINI <0.03    BNP: No results for input(s): PROBNP in the last 8760 hours.  Coagulation: No results for input(s): INR in the last 168 hours.  ECG:   Medications:   Scheduled Medications: . escitalopram  20 mg Oral QPM  . insulin aspart  25 Units Subcutaneous TID WC  . insulin NPH Human  80 Units Subcutaneous BID AC & HS  . pantoprazole  40 mg Oral Daily  . potassium chloride  40 mEq Oral Q4H  . propranolol ER  120 mg Oral q morning - 10a  . sodium chloride  3 mL Intravenous Q12H  .  sodium chloride  3 mL Intravenous Q12H     Infusions:     PRN Medications:  sodium chloride, acetaminophen **OR** acetaminophen, albuterol, sodium chloride     Assessment/Plan    1. Long QT - in general very flat T-waves that are difficult to measure - QTc this admit around 510, previous admit EKGs with QTc reportedly in 600s by compute was 530 ms by manual measure, her flat T-waves complicate measuremnts - with improved K her QTc is down to 450, primary issue is electrolyte driven.   2. Chronic diastolic HF - echo LVEF 63-78%, grade II diastolic dysfunction - appears euvolemic today, diuretics have been on hold - would stop her home lasix and start torsemide 20mg  bid. Change metolazone to prn only 5 pound weight gain as metolazone much more likely to induce electrolyte abnormalities. Start aldactone 25mg  daily. Would decrease her home potassium to 21mEq bid until follow up.   3. Hypokalemia - K up to 3.4 today, consider additional PO dose prior to discharge   She will f/u with NP Purcell Nails in 1 week, will get BMET and Mg  at that time as well. Will sign off inpatient care.        Carlyle Dolly, M.D.

## 2015-05-16 NOTE — Discharge Summary (Signed)
Physician Discharge Summary  [name] TOI:[id number] DOB: Jul 21, 1961 DOA: 05/14/2015  PCP: Sherrie Mustache, MD  Admit date: 05/14/2015 Discharge date: 05/16/2015  Recommendations for Outpatient Follow-up:  1. Hypokalemia, and context of diuretic Scott, with associated prolonged QT; see discussion below.  2. Grade 2 diastolic dysfunction    Follow-up Information    Follow up with Sherrie Mustache, MD.   Specialty:  Family Medicine   Why:  As needed   Contact information:   [address] [place] [address] [phone]       Follow up with [name], NP On 05/23/2015.   Specialties:  Nurse Practitioner, Radiology, Cardiology   Why:  at 1:50 pm   Contact information:   [place] [address] [phone]      Discharge Diagnoses:  1. Symptomatic hypokalemia 2. Prolonged QT 3. Diabetes mellitus on insulin 4. Grade 2 diastolic dysfunction, suspected chronic diastolic congestive heart failure   Discharge Condition: improved Disposition: home  Diet recommendation: diabetic diet, heart healthy  Filed Weights   05/14/15 1504 05/14/15 2005  Weight: 131.543 kg (290 lb) 132.496 kg (292 lb 1.6 oz)    History of present illness:  53 year old woman with history of chronic lower extremity edema maintained on Lasix and Zaroxolyn with history of recurrent hypokalemia, presented to the emergency department with generalized weakness for 2-3 days. Initial evaluation revealed hypokalemia and prolonged QT and the patient was referred for admission.  Hospital Course:  Ms. Plourde was admitted for aggressive potassium repletion, she gradually improved, symptoms resolved and QTc normalized. Telemetry was unrevealing and 2d echocardiogram was reassuring. She was seen by cardiology with recommendations as below.  ho  1. Symptomatic hypokalemia, resolved. Secondary to aggressive diuretic therapy. 2. Prolonged QT. Improved with K+ repletion,  down to 450. Secondary to hypokalemia. 3. Diabetes mellitus, stable. 4. COPD. Stable. 5. Grade 2 diastolic dysfunction, chronic diastolic heart failure.  6. Chronic lower extremity edema secondary to diastolic dysfunction.   Stop Lasix and start torsemide 20mg  bid. Change metolazone to prn only 5 pound weight gain as metolazone much more likely to induce electrolyte abnormalities. Start aldactone 25mg  daily. Decrease potassium to 76mEq bid until follow up.   F/u with NP Purcell Nails in 1 week, will get BMET and Mg at that time as well.  Consultants:  Cardiology  Procedures:  2d echo Study Conclusions  - Left ventricle: The cavity size was normal. Wall thickness was increased in a pattern of mild LVH. Systolic function was normal. The estimated ejection fraction was in the range of 60% to 65%. Wall motion was normal; there were no regional wall motion abnormalities. Features are consistent with a pseudonormal left ventricular filling pattern, with concomitant abnormal relaxation and increased filling pressure (grade 2 diastolic dysfunction). - Aortic valve: Valve area (VTI): 3.43 cm^2. Valve area (Vmax): 2.94 cm^2. - Mitral valve: Mildly calcified annulus. Normal thickness leaflets . - Left atrium: The atrium was mildly to moderately dilated. - Atrial septum: No defect or patent foramen ovale was identified. - Technically difficult study. Echocontrast was used to enhance visualization.  Discharge Instructions  Discharge Instructions    Activity as tolerated - No restrictions    Complete by:  As directed      Compression stockings    Complete by:  As directed      Diet - low sodium heart healthy    Complete by:  As directed      Diet Carb Modified    Complete by:  As directed      Discharge instructions    Complete by:  As directed   Call your physician or seek immediate medical attention for shortness of breath, weight gain, dizziness, weakness,  fatigue, swelling, pain or worsening of condition.          Current Discharge Medication List    START taking these medications   Details  spironolactone (ALDACTONE) 25 MG tablet Take 1 tablet (25 mg total) by mouth daily. Qty: 30 tablet, Refills: 0    torsemide (DEMADEX) 20 MG tablet Take 1 tablet (20 mg total) by mouth 2 (two) times daily. Qty: 60 tablet, Refills: 0      CONTINUE these medications which have CHANGED   Details  metolazone (ZAROXOLYN) 5 MG tablet Take 1 tablet (5 mg total) by mouth daily as needed (5 pound weight gain only. If weight is stable, do not take it.).    potassium chloride SA (K-DUR,KLOR-CON) 20 MEQ tablet Take 1 tablet (20 mEq total) by mouth 2 (two) times daily.      CONTINUE these medications which have NOT CHANGED   Details  albuterol (PROAIR HFA) 108 (90 BASE) MCG/ACT inhaler Inhale 2 puffs into the lungs every 6 (six) hours as needed for wheezing or shortness of breath.    escitalopram (LEXAPRO) 20 MG tablet Take 20 mg by mouth every evening.    esomeprazole (NEXIUM) 20 MG capsule Take 20 mg by mouth daily at 12 noon.    insulin NPH (HUMULIN N,NOVOLIN N) 100 UNIT/ML injection Inject 80-100 Units into the skin 2 (two) times daily. Patient injects 100 units in the morning and 80 units at night.     insulin regular (HUMULIN R,NOVOLIN R) 100 units/mL injection Inject 50 Units into the skin 3 (three) times daily.     propranolol ER (INDERAL LA) 120 MG 24 hr capsule Take 120 mg by mouth every morning.      STOP taking these medications     furosemide (LASIX) 40 MG tablet        Allergies  Allergen Reactions  . Benicar [Olmesartan] Swelling  . Codeine Other (See Comments)    Confusion   . Sulfa Antibiotics Swelling    Whole face swells    The results of significant diagnostics from this hospitalization (including imaging, microbiology, ancillary and laboratory) are listed below for reference.    Significant Diagnostic Studies: Dg  Chest 2 View  05/14/2015   CLINICAL DATA:  Weakness, nausea and shortness of breath for 4 days.  EXAM: CHEST  2 VIEW  COMPARISON:  03/11/2015.  FINDINGS: Trachea is midline. Heart size normal. Lungs are clear. No pleural fluid.  IMPRESSION: No acute findings.   Electronically Signed   By: Lorin Picket M.D.   On: 05/14/2015 17:35     Labs: Basic Metabolic Panel:  Recent Labs Lab 05/14/15 1536 05/15/15 0616 05/16/15 0603  NA 136 140 142  K 2.7* 3.0* 3.4*  CL 90* 98* 101  CO2 33* 34* 31  GLUCOSE 301* 224* 66  BUN 16 14 14   CREATININE 0.95 0.74 0.65  CALCIUM 8.9 8.3* 8.7*  MG 1.7  --  2.0   CBC:  Recent Labs Lab 05/14/15 1536  WBC 10.7*  HGB 15.7*  HCT 44.9  MCV 92.2  PLT 317   Cardiac Enzymes:  Recent Labs Lab 05/14/15 1536  TROPONINI <0.03     Recent Labs  05/14/15 1536  BNP 24.0   CBG:  Recent Labs Lab 05/15/15 0750 05/15/15  1126 05/15/15 2114 05/16/15 0752 05/16/15 1128  GLUCAP 189* 245* 137* 68 185*    Principal Problem:   Hypokalemia Active Problems:   Diabetes mellitus   COPD (chronic obstructive pulmonary disease)   Prolonged QT interval   Diastolic dysfunction   Time coordinating discharge: 35 minutes  Signed:  Murray Hodgkins, MD [facility] 05/16/2015, 11:53 AM

## 2015-05-21 ENCOUNTER — Encounter: Payer: Self-pay | Admitting: Internal Medicine

## 2015-05-23 ENCOUNTER — Encounter: Payer: Self-pay | Admitting: Adult Health

## 2015-05-23 ENCOUNTER — Ambulatory Visit (INDEPENDENT_AMBULATORY_CARE_PROVIDER_SITE_OTHER): Payer: PRIVATE HEALTH INSURANCE | Admitting: Adult Health

## 2015-05-23 VITALS — BP 116/62 | HR 80 | Ht 61.0 in | Wt 288.0 lb

## 2015-05-23 DIAGNOSIS — I4581 Long QT syndrome: Secondary | ICD-10-CM | POA: Diagnosis not present

## 2015-05-23 DIAGNOSIS — I5032 Chronic diastolic (congestive) heart failure: Secondary | ICD-10-CM | POA: Diagnosis not present

## 2015-05-23 DIAGNOSIS — R9431 Abnormal electrocardiogram [ECG] [EKG]: Secondary | ICD-10-CM

## 2015-05-23 DIAGNOSIS — Z79899 Other long term (current) drug therapy: Secondary | ICD-10-CM

## 2015-05-23 NOTE — Progress Notes (Signed)
Cardiology Office Note   Date:  05/23/2015   ID:  KARLE DESROSIER, DOB [birth year], MRN [id number]  PCP:  Sherrie Mustache, MD  Cardiologist: To be est.  [name], NP   Chief Complaint  Patient presents with  . Congestive Heart Failure      History of Present Illness: LERLENE TREADWELL is a 54 y.o. female who presents for ongoing assessment and management other history to include diabetes with grade 2 diastolic dysfunction, with chronic diastolic heart failure, history of prolonged QT interval in the setting of symptomatic hypokalemia, and other history to include diabetes.  Patient's QT interval was improved with potassium repletion. Echocardiogram was completed during hospitalization, with LVEF of 60-65%.  She was found to have grade 2 diastolic dysfunction.  The left atrium was moderately dilated.  The patient was sent home on potassium 40 mEq daily in divided doses.  Metolazone was decreased to when necessary, torsemide, was provided 20 mg twice a day with the addition of spironolactone 25 mg daily.  The patient will need a followup BMET today.  On discharge, the patient's potassium was 3.4.  She comes today feeling better.  She says that her energy is better.  She is compliant with her medications and has not feel tired.  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 She denies palpitations, racing heart rate, chest pain, or dizziness.  The patient has placed herself on an 1800 calorie diet and is trying to lose weight and she states that this improved energy is motivating her to continue with some exercise and being more mindful of her health.    Past Medical History  Diagnosis Date  . Diabetes mellitus   . Hypertension   . Asthma   . Depression   . COPD (chronic obstructive pulmonary disease)   . Prolonged QT interval 05/14/2015  . Diastolic dysfunction 07/10/276     Past Surgical History  Procedure Laterality Date  . Cesarean section    . Cholecystectomy    . Cataract extraction       Current Outpatient Prescriptions  Medication Sig Dispense Refill  . albuterol (PROAIR HFA) 108 (90 BASE) MCG/ACT inhaler Inhale 2 puffs into the lungs every 6 (six) hours as needed for wheezing or shortness of breath.    . escitalopram (LEXAPRO) 20 MG tablet Take 20 mg by mouth every evening.    Marland Kitchen esomeprazole (NEXIUM) 20 MG capsule Take 20 mg by mouth daily at 12 noon.    . insulin NPH (HUMULIN N,NOVOLIN N) 100 UNIT/ML injection Inject 80-100 Units into the skin 2 (two) times daily. Patient injects 100 units in the morning and 80 units at night.     . insulin regular (HUMULIN R,NOVOLIN R) 100 units/mL injection Inject 50 Units into the skin 3 (three) times daily.     . metolazone (ZAROXOLYN) 5 MG tablet Take 1 tablet (5 mg total) by mouth daily as needed (5 pound weight gain only. If weight is stable, do not take it.).    Marland Kitchen potassium chloride SA (K-DUR,KLOR-CON) 20 MEQ tablet Take 1 tablet (20 mEq total) by mouth 2 (two) times daily.    . propranolol ER (INDERAL LA) 120 MG 24 hr capsule Take 120 mg by mouth every morning.    Marland Kitchen spironolactone (ALDACTONE) 25 MG tablet Take 1 tablet (25 mg total) by mouth daily. 30 tablet 0  . torsemide (  DEMADEX) 20 MG tablet Take 1 tablet (20 mg total) by mouth 2 (two) times daily. 60 tablet 0   No current facility-administered medications for this visit.    Allergies:   Benicar; Codeine; and Sulfa antibiotics    Social History:  The patient  reports that she has never smoked. She has never used smokeless tobacco. She reports that she does not drink alcohol or use illicit drugs.   Family History:  The patient's family history includes Cancer in her other; Diabetes in her father and sister; Heart failure in her father and sister; Hypertension in her father and sister; Stroke in her mother.    ROS: .   All other systems are  reviewed and negative.Unless otherwise mentioned in  H&P above.   PHYSICAL EXAM: VS:  LMP 08/06/2012 , BMI There is no weight on file to calculate BMI. GEN: Well nourished, well developed, in no acute distressObese. HEENT: normal Neck: no JVD, carotid bruits, or masses Cardiac: RRR; no murmurs, rubs, or gallops,no edema  Respiratory:  Clear to auscultation bilaterally, normal work of breathing GI: soft, nontender, nondistended, + BS MS: no deformity or atrophy Skin: warm and dry, no rash Neuro:  Strength and sensation are intact Psych: euthymic mood, full affect   EKG:  The ekg ordered today demonstrates NSR rate of 69 bpm with QT 440 ms.    Recent Labs: 05/14/2015: B Natriuretic Peptide 24.0; Hemoglobin 15.7*; Platelets 317 05/16/2015: BUN 14; Creatinine, Ser 0.65; Magnesium 2.0; Potassium 3.4*; Sodium 142    Lipid Panel No results found for: CHOL, TRIG, HDL, CHOLHDL, VLDL, LDLCALC, LDLDIRECT    Wt Readings from Last 3 Encounters:  05/14/15 292 lb 1.6 oz (132.496 kg)  07/22/14 290 lb 9.1 oz (131.8 kg)  01/03/14 288 lb (130.636 kg)      Other studies Reviewed: Additional studies/ records that were reviewed today include: None  Review of the above records demonstrates: None   ASSESSMENT AND PLAN:  1. Hypokalemia with prolonged QT interval: The patient was placed on spironolactone during hospitalization.  Repeat EKG reveals a normal lies QT interval at 440 ms.  She remains in normal sinus rhythm.  She is feeling better.  She is becoming more active.  We will repeat be met to evaluate potassium status.  2. Grade II Diastolic Dysfunction: continue current medication regimen, to include diuretics.  Followup labs as directed.  The patient is on propanolol ER 120 mg daily.  She is complaining of some lower extremity edema at the end of the day.  I reassured her.  This is related to her medication regimen.  She is doing her best to avoid salt and become more active.  I have given  her a prescription for TED hose to assist with dependent edema.   Current medicines are reviewed at length with the patient today.    Labs/ tests ordered today include:BMET No orders of the defined types were placed in this encounter.     Disposition:   FU with 3 months. Signed, [name], NP  05/23/2015 7:21 AM    [place] [address], [place], [place] [address] Phone: [phone]; Fax: [phone]

## 2015-05-23 NOTE — Patient Instructions (Signed)
Your physician recommends that you schedule a follow-up appointment in: 3 months   Please wear support stockings  Please get lab work BMET in 1 month      Thank you for choosing [facility] !

## 2015-05-23 NOTE — Progress Notes (Deleted)
Name: [name]    DOB: 04-Jun-1961  Age: 54 y.o.  MR#: [id number]       PCP:  Sherrie Mustache, MD      Insurance: Payor: MEDCOST / Plan: MEDCOST / Product Type: *No Product type* /   CC:    Chief Complaint  Patient presents with  . Congestive Heart Failure    VS Filed Vitals:   05/23/15 1346  BP: 116/62  Pulse: 80  Height: 5\' 1"  (1.549 m)  Weight: 288 lb (130.636 kg)  SpO2: 95%    Weights Current Weight  05/23/15 288 lb (130.636 kg)  05/14/15 292 lb 1.6 oz (132.496 kg)  07/22/14 290 lb 9.1 oz (131.8 kg)    Blood Pressure  BP Readings from Last 3 Encounters:  05/23/15 116/62  05/16/15 140/53  07/23/14 125/52     Admit date:  (Not on file) Last encounter with RMR:  Visit date not found   Allergy Benicar; Codeine; and Sulfa antibiotics  Current Outpatient Prescriptions  Medication Sig Dispense Refill  . albuterol (PROAIR HFA) 108 (90 BASE) MCG/ACT inhaler Inhale 2 puffs into the lungs every 6 (six) hours as needed for wheezing or shortness of breath.    . escitalopram (LEXAPRO) 20 MG tablet Take 20 mg by mouth every evening.    Marland Kitchen esomeprazole (NEXIUM) 20 MG capsule Take 20 mg by mouth daily at 12 noon.    . insulin NPH (HUMULIN N,NOVOLIN N) 100 UNIT/ML injection Inject 80-100 Units into the skin 2 (two) times daily. Patient injects 100 units in the morning and 80 units at night.     . insulin regular (HUMULIN R,NOVOLIN R) 100 units/mL injection Inject 50 Units into the skin 3 (three) times daily.     . metolazone (ZAROXOLYN) 5 MG tablet Take 1 tablet (5 mg total) by mouth daily as needed (5 pound weight gain only. If weight is stable, do not take it.).    Marland Kitchen potassium chloride SA (K-DUR,KLOR-CON) 20 MEQ tablet Take 1 tablet (20 mEq total) by mouth 2 (two) times daily.    . propranolol ER (INDERAL LA) 120 MG 24 hr capsule Take 120 mg by mouth every morning.    Marland Kitchen spironolactone (ALDACTONE) 25 MG tablet Take 1 tablet (25 mg total) by mouth daily. 30 tablet 0  .  torsemide (DEMADEX) 20 MG tablet Take 1 tablet (20 mg total) by mouth 2 (two) times daily. 60 tablet 0   No current facility-administered medications for this visit.    Discontinued Meds:   There are no discontinued medications.  Patient Active Problem List   Diagnosis Date Noted  . Diastolic dysfunction 94/85/4627  . Prolonged QT interval 05/14/2015  . Palpitations 07/23/2014  . Generalized weakness 07/23/2014  . Hypokalemia 07/22/2014  . Diabetes mellitus 07/22/2014  . Hypertension 07/22/2014  . COPD (chronic obstructive pulmonary disease) 07/22/2014  . Depression 07/22/2014    LABS    Component Value Date/Time   NA 142 05/16/2015 0603   NA 140 05/15/2015 0616   NA 136 05/14/2015 1536   K 3.4* 05/16/2015 0603   K 3.0* 05/15/2015 0616   K 2.7* 05/14/2015 1536   CL 101 05/16/2015 0603   CL 98* 05/15/2015 0616   CL 90* 05/14/2015 1536   CO2 31 05/16/2015 0603   CO2 34* 05/15/2015 0616   CO2 33* 05/14/2015 1536   GLUCOSE 66 05/16/2015 0603   GLUCOSE 224* 05/15/2015 0616   GLUCOSE 301* 05/14/2015 1536   BUN 14 05/16/2015 0603  BUN 14 05/15/2015 0616   BUN 16 05/14/2015 1536   CREATININE 0.65 05/16/2015 0603   CREATININE 0.74 05/15/2015 0616   CREATININE 0.95 05/14/2015 1536   CALCIUM 8.7* 05/16/2015 0603   CALCIUM 8.3* 05/15/2015 0616   CALCIUM 8.9 05/14/2015 1536   GFRNONAA >60 05/16/2015 0603   GFRNONAA >60 05/15/2015 0616   GFRNONAA >60 05/14/2015 1536   GFRAA >60 05/16/2015 0603   GFRAA >60 05/15/2015 0616   GFRAA >60 05/14/2015 1536   CMP     Component Value Date/Time   NA 142 05/16/2015 0603   K 3.4* 05/16/2015 0603   CL 101 05/16/2015 0603   CO2 31 05/16/2015 0603   GLUCOSE 66 05/16/2015 0603   BUN 14 05/16/2015 0603   CREATININE 0.65 05/16/2015 0603   CALCIUM 8.7* 05/16/2015 0603   PROT 7.6 04/30/2013 1738   ALBUMIN 3.1* 04/30/2013 1738   AST 70* 04/30/2013 1738   ALT 57* 04/30/2013 1738   ALKPHOS 257* 04/30/2013 1738   BILITOT 0.6  04/30/2013 1738   GFRNONAA >60 05/16/2015 0603   GFRAA >60 05/16/2015 0603       Component Value Date/Time   WBC 10.7* 05/14/2015 1536   WBC 12.0* 07/22/2014 1927   WBC 8.5 04/30/2013 1738   HGB 15.7* 05/14/2015 1536   HGB 16.4* 07/22/2014 1927   HGB 15.9* 04/30/2013 1738   HCT 44.9 05/14/2015 1536   HCT 46.2* 07/22/2014 1927   HCT 46.0 04/30/2013 1738   MCV 92.2 05/14/2015 1536   MCV 91.3 07/22/2014 1927   MCV 92.2 04/30/2013 1738    Lipid Panel  No results found for: CHOL, TRIG, HDL, CHOLHDL, VLDL, LDLCALC, LDLDIRECT  ABG    Component Value Date/Time   HCO3 27.5* 06/15/2007 1909   TCO2 29 06/15/2007 1909     Lab Results  Component Value Date   TSH 3.765 ***Test methodology is 3rd generation TSH*** 06/15/2007   BNP (last 3 results)  Recent Labs  05/14/15 1536  BNP 24.0    ProBNP (last 3 results) No results for input(s): PROBNP in the last 8760 hours.  Cardiac Panel (last 3 results) No results for input(s): CKTOTAL, CKMB, TROPONINI, RELINDX in the last 72 hours.  Iron/TIBC/Ferritin/ %Sat No results found for: IRON, TIBC, FERRITIN, IRONPCTSAT   EKG Orders placed or performed in visit on 05/23/15  . EKG 12-Lead     Prior Assessment and Plan Problem List as of 05/23/2015      Cardiovascular and Mediastinum   Hypertension   Prolonged QT interval     Respiratory   COPD (chronic obstructive pulmonary disease)     Endocrine   Diabetes mellitus     Other   Hypokalemia   Depression   Palpitations   Generalized weakness   Diastolic dysfunction       Imaging: Dg Chest 2 View  05/14/2015   CLINICAL DATA:  Weakness, nausea and shortness of breath for 4 days.  EXAM: CHEST  2 VIEW  COMPARISON:  03/11/2015.  FINDINGS: Trachea is midline. Heart size normal. Lungs are clear. No pleural fluid.  IMPRESSION: No acute findings.   Electronically Signed   By: Lorin Picket M.D.   On: 05/14/2015 17:35

## 2015-08-26 DIAGNOSIS — E1159 Type 2 diabetes mellitus with other circulatory complications: Secondary | ICD-10-CM | POA: Insufficient documentation

## 2015-08-26 DIAGNOSIS — I152 Hypertension secondary to endocrine disorders: Secondary | ICD-10-CM | POA: Insufficient documentation

## 2015-09-03 ENCOUNTER — Other Ambulatory Visit (HOSPITAL_COMMUNITY): Payer: Self-pay | Admitting: Adult Health Nurse Practitioner

## 2015-09-03 DIAGNOSIS — Z1231 Encounter for screening mammogram for malignant neoplasm of breast: Secondary | ICD-10-CM

## 2015-09-03 DIAGNOSIS — Z139 Encounter for screening, unspecified: Secondary | ICD-10-CM

## 2015-09-04 ENCOUNTER — Encounter (INDEPENDENT_AMBULATORY_CARE_PROVIDER_SITE_OTHER): Payer: Self-pay | Admitting: *Deleted

## 2015-09-08 ENCOUNTER — Other Ambulatory Visit (HOSPITAL_COMMUNITY): Payer: PRIVATE HEALTH INSURANCE

## 2015-09-08 ENCOUNTER — Ambulatory Visit (HOSPITAL_COMMUNITY): Payer: PRIVATE HEALTH INSURANCE

## 2015-12-03 DIAGNOSIS — F411 Generalized anxiety disorder: Secondary | ICD-10-CM | POA: Insufficient documentation

## 2016-03-16 ENCOUNTER — Encounter: Payer: Self-pay | Admitting: Adult Health

## 2016-03-16 ENCOUNTER — Ambulatory Visit (INDEPENDENT_AMBULATORY_CARE_PROVIDER_SITE_OTHER): Payer: PRIVATE HEALTH INSURANCE | Admitting: Adult Health

## 2016-03-16 ENCOUNTER — Encounter: Payer: Self-pay | Admitting: *Deleted

## 2016-03-16 VITALS — BP 122/68 | HR 82 | Ht 61.0 in | Wt 301.0 lb

## 2016-03-16 DIAGNOSIS — G4730 Sleep apnea, unspecified: Secondary | ICD-10-CM | POA: Diagnosis not present

## 2016-03-16 DIAGNOSIS — I10 Essential (primary) hypertension: Secondary | ICD-10-CM

## 2016-03-16 DIAGNOSIS — R0609 Other forms of dyspnea: Secondary | ICD-10-CM | POA: Diagnosis not present

## 2016-03-16 DIAGNOSIS — R0600 Dyspnea, unspecified: Secondary | ICD-10-CM

## 2016-03-16 NOTE — Progress Notes (Deleted)
Name: NIREL SFERRAZZA    DOB: [birth year]  Age: 55 y.o.  MR#: [id number]       PCP:  Sherrie Mustache, MD      Insurance: Payor: MEDCOST / Plan: MEDCOST / Product Type: *No Product type* /   CC:   No chief complaint on file.   VS Filed Vitals:   03/16/16 1458  BP: 122/68  Pulse: 82  Height: 5\' 1"  (1.549 m)  Weight: 301 lb (136.533 kg)  SpO2: 93%    Weights Current Weight  03/16/16 301 lb (136.533 kg)  05/23/15 288 lb (130.636 kg)  05/14/15 292 lb 1.6 oz (132.496 kg)    Blood Pressure  BP Readings from Last 3 Encounters:  03/16/16 122/68  05/23/15 116/62  05/16/15 140/53     Admit date:  (Not on file) Last encounter with RMR:  Visit date not found   Allergy Benicar; Codeine; and Sulfa antibiotics  Current Outpatient Prescriptions  Medication Sig Dispense Refill  . albuterol (PROAIR HFA) 108 (90 BASE) MCG/ACT inhaler Inhale 2 puffs into the lungs every 6 (six) hours as needed for wheezing or shortness of breath.    Marland Kitchen amLODipine (NORVASC) 5 MG tablet     . doxycycline (VIBRAMYCIN) 100 MG capsule Take by mouth.    . Dulaglutide (TRULICITY) 1.5 0000000 SOPN Inject into the skin.    Marland Kitchen escitalopram (LEXAPRO) 20 MG tablet Take 20 mg by mouth every evening.    Marland Kitchen esomeprazole (NEXIUM) 20 MG capsule Take 20 mg by mouth daily at 12 noon.    . insulin NPH (HUMULIN N,NOVOLIN N) 100 UNIT/ML injection Inject 80-100 Units into the skin 2 (two) times daily. Patient injects 100 units in the morning and 80 units at night.     . insulin regular (HUMULIN R,NOVOLIN R) 100 units/mL injection Inject 50 Units into the skin 3 (three) times daily.     Marland Kitchen levothyroxine (LEVOTHROID) 25 MCG tablet Take by mouth.    Marland Kitchen LORazepam (ATIVAN) 1 MG tablet     . metolazone (ZAROXOLYN) 5 MG tablet Take 1 tablet (5 mg total) by mouth daily as needed (5 pound weight gain only. If weight is stable, do not take it.).    Marland Kitchen potassium chloride SA (K-DUR,KLOR-CON) 20 MEQ tablet Take 1 tablet (20 mEq total) by  mouth 2 (two) times daily.    . propranolol ER (INDERAL LA) 120 MG 24 hr capsule Take 120 mg by mouth every morning.    Marland Kitchen spironolactone (ALDACTONE) 25 MG tablet Take 1 tablet (25 mg total) by mouth daily. 30 tablet 0  . torsemide (DEMADEX) 20 MG tablet Take 1 tablet (20 mg total) by mouth 2 (two) times daily. 60 tablet 0  . lisinopril (PRINIVIL,ZESTRIL) 2.5 MG tablet TAKE ONE TABLET (2.5 MG TOTAL) BY MOUTH DAILY.  5   No current facility-administered medications for this visit.    Discontinued Meds:    Medications Discontinued During This Encounter  Medication Reason  . spironolactone (ALDACTONE) 25 MG tablet Error    Patient Active Problem List   Diagnosis Date Noted  . Diastolic dysfunction Q000111Q  . Prolonged QT interval 05/14/2015  . Palpitations 07/23/2014  . Generalized weakness 07/23/2014  . Hypokalemia 07/22/2014  . Diabetes mellitus ([place]) 07/22/2014  . Hypertension 07/22/2014  . COPD (chronic obstructive pulmonary disease) ([place]) 07/22/2014  . Depression 07/22/2014    LABS    Component Value Date/Time   NA 142 05/16/2015 0603   NA 140 05/15/2015 0616   NA  136 05/14/2015 1536   K 3.4* 05/16/2015 0603   K 3.0* 05/15/2015 0616   K 2.7* 05/14/2015 1536   CL 101 05/16/2015 0603   CL 98* 05/15/2015 0616   CL 90* 05/14/2015 1536   CO2 31 05/16/2015 0603   CO2 34* 05/15/2015 0616   CO2 33* 05/14/2015 1536   GLUCOSE 66 05/16/2015 0603   GLUCOSE 224* 05/15/2015 0616   GLUCOSE 301* 05/14/2015 1536   BUN 14 05/16/2015 0603   BUN 14 05/15/2015 0616   BUN 16 05/14/2015 1536   CREATININE 0.65 05/16/2015 0603   CREATININE 0.74 05/15/2015 0616   CREATININE 0.95 05/14/2015 1536   CALCIUM 8.7* 05/16/2015 0603   CALCIUM 8.3* 05/15/2015 0616   CALCIUM 8.9 05/14/2015 1536   GFRNONAA >60 05/16/2015 0603   GFRNONAA >60 05/15/2015 0616   GFRNONAA >60 05/14/2015 1536   GFRAA >60 05/16/2015 0603   GFRAA >60 05/15/2015 0616   GFRAA >60 05/14/2015 1536   CMP      Component Value Date/Time   NA 142 05/16/2015 0603   K 3.4* 05/16/2015 0603   CL 101 05/16/2015 0603   CO2 31 05/16/2015 0603   GLUCOSE 66 05/16/2015 0603   BUN 14 05/16/2015 0603   CREATININE 0.65 05/16/2015 0603   CALCIUM 8.7* 05/16/2015 0603   PROT 7.6 04/30/2013 1738   ALBUMIN 3.1* 04/30/2013 1738   AST 70* 04/30/2013 1738   ALT 57* 04/30/2013 1738   ALKPHOS 257* 04/30/2013 1738   BILITOT 0.6 04/30/2013 1738   GFRNONAA >60 05/16/2015 0603   GFRAA >60 05/16/2015 0603       Component Value Date/Time   WBC 10.7* 05/14/2015 1536   WBC 12.0* 07/22/2014 1927   WBC 8.5 04/30/2013 1738   HGB 15.7* 05/14/2015 1536   HGB 16.4* 07/22/2014 1927   HGB 15.9* 04/30/2013 1738   HCT 44.9 05/14/2015 1536   HCT 46.2* 07/22/2014 1927   HCT 46.0 04/30/2013 1738   MCV 92.2 05/14/2015 1536   MCV 91.3 07/22/2014 1927   MCV 92.2 04/30/2013 1738    Lipid Panel  No results found for: CHOL, TRIG, HDL, CHOLHDL, VLDL, LDLCALC, LDLDIRECT  ABG    Component Value Date/Time   HCO3 27.5* 06/15/2007 1909   TCO2 29 06/15/2007 1909     Lab Results  Component Value Date   TSH 3.765 ***Test methodology is 3rd generation TSH*** 06/15/2007   BNP (last 3 results)  Recent Labs  05/14/15 1536  BNP 24.0    ProBNP (last 3 results) No results for input(s): PROBNP in the last 8760 hours.  Cardiac Panel (last 3 results) No results for input(s): CKTOTAL, CKMB, TROPONINI, RELINDX in the last 72 hours.  Iron/TIBC/Ferritin/ %Sat No results found for: IRON, TIBC, FERRITIN, IRONPCTSAT   EKG Orders placed or performed in visit on 05/23/15  . EKG 12-Lead     Prior Assessment and Plan Problem List as of 03/16/2016      Cardiovascular and Mediastinum   Hypertension   Prolonged QT interval     Respiratory   COPD (chronic obstructive pulmonary disease) (HCC)     Endocrine   Diabetes mellitus (HCC)     Other   Hypokalemia   Depression   Palpitations   Generalized weakness   Diastolic  dysfunction       Imaging: No results found.

## 2016-03-16 NOTE — Patient Instructions (Signed)
Your physician recommends that you schedule a follow-up appointment after your stress test.   Your physician recommends that you continue on your current medications as directed. Please refer to the Current Medication list given to you today.  Your physician has requested that you have a lexiscan myoview. For further information please visit [email or web address]. Please follow instruction sheet, as given.   Your physician has recommended that you have a sleep study. This test records several body functions during sleep, including: brain activity, eye movement, oxygen and carbon dioxide blood levels, heart rate and rhythm, breathing rate and rhythm, the flow of air through your mouth and nose, snoring, body muscle movements, and chest and belly movement.   If you need a refill on your cardiac medications before your next appointment, please call your pharmacy.  Thank you for choosing [facility]!

## 2016-03-16 NOTE — Progress Notes (Signed)
Cardiology Office Note   Date:  03/16/2016   ID:  ZEYNEB [name], DOB Aug 17, 1961, MRN [id number]  PCP:  Sherrie Mustache, MD  Cardiologist: To be established   [name], NP   Chief Complaint  Patient presents with  . Shortness of Breath      History of Present Illness: [name] is a 55 y.o. female who presents for ongoing assessment and management of chronic diastolic heart failure, history of prolonged QT interval in the setting of symptomatic hypokalemia, normal LV systolic function with an EF of 60-65%.  Other history includes diabetes,COPD, and hypertension.  Today having gained weight, has become more fatigued, more short of breath, with some mild chest pressure.  The patient also has had an evaluation in the remote past for sleep apnea, but did not have followup or institution of CPAP.  The patient tries to walk on a treadmill, but does not walk long do to significant fatigue.  She is being managed by Dr. Edrick Oh for blood glucose control, and has had multiple medication changes.    Past Medical History  Diagnosis Date  . Diabetes mellitus   . Hypertension   . Asthma   . Depression   . COPD (chronic obstructive pulmonary disease) ([place])   . Prolonged QT interval 05/14/2015  . Diastolic dysfunction Q000111Q    Past Surgical History  Procedure Laterality Date  . Cesarean section    . Cholecystectomy    . Cataract extraction       Current Outpatient Prescriptions  Medication Sig Dispense Refill  . albuterol (PROAIR HFA) 108 (90 BASE) MCG/ACT inhaler Inhale 2 puffs into the lungs every 6 (six) hours as needed for wheezing or shortness of breath.    Marland Kitchen amLODipine (NORVASC) 5 MG tablet     . doxycycline (VIBRAMYCIN) 100 MG capsule Take by mouth.    . Dulaglutide (TRULICITY) 1.5 0000000 SOPN Inject into the skin.    Marland Kitchen escitalopram (LEXAPRO) 20 MG tablet Take 20 mg by mouth every evening.    Marland Kitchen esomeprazole (NEXIUM) 20 MG capsule Take 20 mg by mouth  daily at 12 noon.    . insulin NPH (HUMULIN N,NOVOLIN N) 100 UNIT/ML injection Inject 80-100 Units into the skin 2 (two) times daily. Patient injects 100 units in the morning and 80 units at night.     . insulin regular (HUMULIN R,NOVOLIN R) 100 units/mL injection Inject 50 Units into the skin 3 (three) times daily.     Marland Kitchen levothyroxine (LEVOTHROID) 25 MCG tablet Take by mouth.    Marland Kitchen LORazepam (ATIVAN) 1 MG tablet     . metolazone (ZAROXOLYN) 5 MG tablet Take 1 tablet (5 mg total) by mouth daily as needed (5 pound weight gain only. If weight is stable, do not take it.).    Marland Kitchen potassium chloride SA (K-DUR,KLOR-CON) 20 MEQ tablet Take 1 tablet (20 mEq total) by mouth 2 (two) times daily.    . propranolol ER (INDERAL LA) 120 MG 24 hr capsule Take 120 mg by mouth every morning.    Marland Kitchen spironolactone (ALDACTONE) 25 MG tablet Take 1 tablet (25 mg total) by mouth daily. 30 tablet 0  . torsemide (DEMADEX) 20 MG tablet Take 1 tablet (20 mg total) by mouth 2 (two) times daily. 60 tablet 0  . lisinopril (PRINIVIL,ZESTRIL) 2.5 MG tablet TAKE ONE TABLET (2.5 MG TOTAL) BY MOUTH DAILY.  5   No current facility-administered medications for this visit.    Allergies:   Benicar; Codeine; and  Sulfa antibiotics    Social History:  The patient  reports that she has never smoked. She has never used smokeless tobacco. She reports that she does not drink alcohol or use illicit drugs.   Family History:  The patient's family history includes Cancer in her other; Diabetes in her father and sister; Heart failure in her father and sister; Hypertension in her father and sister; Stroke in her mother.    ROS: All other systems are reviewed and negative. Unless otherwise mentioned in H&P    PHYSICAL EXAM: VS:  BP 122/68 mmHg  Pulse 82  Ht 5\' 1"  (1.549 m)  Wt 301 lb (136.533 kg)  BMI 56.90 kg/m2  SpO2 93%  LMP 08/06/2012 , BMI Body mass index is 56.9 kg/(m^2). GEN: Well nourished, well developed, in no acute  distressmorbidly obese. HEENT: normal Neck: no JVD, carotid bruits, or masses Cardiac: RRR; no murmurs, rubs, or gallops,no edema  Respiratory:  Clear to auscultation bilaterally, normal work of breathingdiminished in the bases.  No wheezes, or rhonchi. GI: soft, nontender, nondistended, + BS MS: no deformity or atrophy Skin: warm and dry, no rash Neuro:  Strength and sensation are intact Psych: euthymic mood, full affect   Recent Labs: 05/14/2015: B Natriuretic Peptide 24.0; Hemoglobin 15.7*; Platelets 317 05/16/2015: BUN 14; Creatinine, Ser 0.65; Magnesium 2.0; Potassium 3.4*; Sodium 142    Lipid Panel No results found for: CHOL, TRIG, HDL, CHOLHDL, VLDL, LDLCALC, LDLDIRECT    Wt Readings from Last 3 Encounters:  03/16/16 301 lb (136.533 kg)  05/23/15 288 lb (130.636 kg)  05/14/15 292 lb 1.6 oz (132.496 kg)      ASSESSMENT AND PLAN:  1. Dyspnea and fatigue:multiple cardiovascular risk factors for CAD, to include hypertension, obesity, diabetes.  The patient had a stress test in 2009, which was negative for ischemia.  I believe her symptoms are related to weight gain and COPD, however, with cardiovascular risk, factors, we will plan a LexiScan Cardiolite (2-today), due to body habitus for diagnostic prognostic purposes.She is also recommended to sign up for a sleep study.  With body habitus, I believe this may be contributing to her overall status.  2. COPD: The patient is on inhaler, but has not been followed by a pulmonologist.  Would be our recommendation that she be seen by a pulmonologist for further evaluation and treatment.  At the discretion of primary care.  3. Diabetes:patient is having multiple medication adjustments per primary care.  4. Obesity:weight loss, and calorie restrictions are recommended.  This is difficult for her with her diabetes.  She may need dietary consultation versus one-on-one education by primary care.     Current medicines are reviewed at  length with the patient today.    Labs/ tests ordered today include: Lexiscan Cardiolite  Orders Placed This Encounter  Procedures  . NM Myocar Multi W/Spect W/Wall Motion / EF  . Nocturnal polysomnography (NPSG)     Disposition:   FU with post stress test Signed, [name], NP  03/16/2016 3:54 PM    [facility] [address], [place], [place] [address] Phone: [phone]; Fax: [phone]

## 2016-03-23 ENCOUNTER — Inpatient Hospital Stay (HOSPITAL_COMMUNITY): Admission: RE | Admit: 2016-03-23 | Payer: PRIVATE HEALTH INSURANCE | Source: Ambulatory Visit

## 2016-03-23 ENCOUNTER — Encounter (HOSPITAL_COMMUNITY): Payer: PRIVATE HEALTH INSURANCE

## 2016-03-31 ENCOUNTER — Encounter (HOSPITAL_COMMUNITY): Payer: PRIVATE HEALTH INSURANCE

## 2016-03-31 ENCOUNTER — Encounter (HOSPITAL_COMMUNITY)
Admission: RE | Admit: 2016-03-31 | Discharge: 2016-03-31 | Disposition: A | Discharge status: Home / Self Care | Payer: PRIVATE HEALTH INSURANCE | Source: Ambulatory Visit | Attending: Adult Health | Admitting: Adult Health

## 2016-03-31 ENCOUNTER — Ambulatory Visit (HOSPITAL_COMMUNITY): Admission: RE | Admit: 2016-03-31 | Payer: PRIVATE HEALTH INSURANCE | Source: Ambulatory Visit

## 2016-03-31 ENCOUNTER — Encounter (HOSPITAL_COMMUNITY): Payer: Self-pay | Admitting: Emergency Medicine

## 2016-03-31 ENCOUNTER — Encounter (HOSPITAL_COMMUNITY): Payer: Self-pay

## 2016-03-31 ENCOUNTER — Emergency Department (HOSPITAL_COMMUNITY)
Admission: EM | Admit: 2016-03-31 | Discharge: 2016-04-01 | Disposition: A | Discharge status: Home / Self Care | Payer: PRIVATE HEALTH INSURANCE | Attending: Emergency Medicine | Admitting: Emergency Medicine

## 2016-03-31 ENCOUNTER — Other Ambulatory Visit: Payer: Self-pay

## 2016-03-31 ENCOUNTER — Emergency Department (HOSPITAL_COMMUNITY): Payer: PRIVATE HEALTH INSURANCE

## 2016-03-31 DIAGNOSIS — E876 Hypokalemia: Secondary | ICD-10-CM | POA: Diagnosis not present

## 2016-03-31 DIAGNOSIS — N179 Acute kidney failure, unspecified: Secondary | ICD-10-CM | POA: Diagnosis not present

## 2016-03-31 DIAGNOSIS — I10 Essential (primary) hypertension: Secondary | ICD-10-CM | POA: Diagnosis not present

## 2016-03-31 DIAGNOSIS — Z794 Long term (current) use of insulin: Secondary | ICD-10-CM | POA: Diagnosis not present

## 2016-03-31 DIAGNOSIS — J449 Chronic obstructive pulmonary disease, unspecified: Secondary | ICD-10-CM | POA: Diagnosis not present

## 2016-03-31 DIAGNOSIS — F329 Major depressive disorder, single episode, unspecified: Secondary | ICD-10-CM | POA: Diagnosis not present

## 2016-03-31 DIAGNOSIS — Z79899 Other long term (current) drug therapy: Secondary | ICD-10-CM | POA: Insufficient documentation

## 2016-03-31 DIAGNOSIS — E860 Dehydration: Secondary | ICD-10-CM | POA: Diagnosis not present

## 2016-03-31 DIAGNOSIS — R079 Chest pain, unspecified: Secondary | ICD-10-CM | POA: Diagnosis present

## 2016-03-31 DIAGNOSIS — Z7722 Contact with and (suspected) exposure to environmental tobacco smoke (acute) (chronic): Secondary | ICD-10-CM | POA: Insufficient documentation

## 2016-03-31 DIAGNOSIS — E119 Type 2 diabetes mellitus without complications: Secondary | ICD-10-CM | POA: Diagnosis not present

## 2016-03-31 DIAGNOSIS — R0609 Other forms of dyspnea: Secondary | ICD-10-CM | POA: Diagnosis not present

## 2016-03-31 DIAGNOSIS — R55 Syncope and collapse: Secondary | ICD-10-CM | POA: Diagnosis not present

## 2016-03-31 DIAGNOSIS — R0600 Dyspnea, unspecified: Secondary | ICD-10-CM

## 2016-03-31 DIAGNOSIS — J45909 Unspecified asthma, uncomplicated: Secondary | ICD-10-CM | POA: Diagnosis not present

## 2016-03-31 LAB — BASIC METABOLIC PANEL
Anion gap: 15 (ref 5–15)
BUN: 29 mg/dL — ABNORMAL HIGH (ref 6–20)
CO2: 31 mmol/L (ref 22–32)
Calcium: 9 mg/dL (ref 8.9–10.3)
Chloride: 91 mmol/L — ABNORMAL LOW (ref 101–111)
Creatinine, Ser: 1.66 mg/dL — ABNORMAL HIGH (ref 0.44–1.00)
GFR calc Af Amer: 39 mL/min — ABNORMAL LOW (ref >60)
GFR calc non Af Amer: 34 mL/min — ABNORMAL LOW (ref >60)
Glucose, Bld: 340 mg/dL — ABNORMAL HIGH (ref 65–99)
Potassium: 2.7 mmol/L — CL (ref 3.5–5.1)
Sodium: 137 mmol/L (ref 135–145)

## 2016-03-31 LAB — CBC
HCT: 39 % (ref 36.0–46.0)
Hemoglobin: 13.6 g/dL (ref 12.0–15.0)
MCH: 31.5 pg (ref 26.0–34.0)
MCHC: 34.9 g/dL (ref 30.0–36.0)
MCV: 90.3 fL (ref 78.0–100.0)
Platelets: 330 10*3/uL (ref 150–400)
RBC: 4.32 MIL/uL (ref 3.87–5.11)
RDW: 13.2 % (ref 11.5–15.5)
WBC: 14.7 10*3/uL — ABNORMAL HIGH (ref 4.0–10.5)

## 2016-03-31 LAB — TROPONIN I: Troponin I: <0.03 ng/mL (ref <0.031)

## 2016-03-31 IMAGING — NM NM MYOCAR MULTI W/SPECT W/WALL MOTION & EF
2 series · 12 of 12 positions shown · non-contrast
Comparison: none

[Series 1: rest · 8.28mm/px · 6 of 64 frames shown]
[frame 6/64]
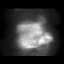
[frame 16/64]
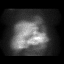
[frame 27/64]
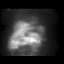
[frame 38/64]
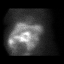
[frame 48/64]
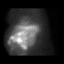
[frame 59/64]
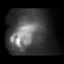

[Series 1: stress gated · 8.28mm/px · 6 of 64 frames shown]
[frame 6/64]
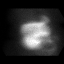
[frame 16/64]
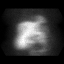
[frame 27/64]
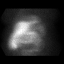
[frame 38/64]
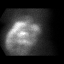
[frame 48/64]
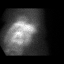
[frame 59/64]
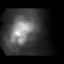

[12 of 12 positions shown; findings below may reference images not displayed]

Canned report from images found in remote index.

Refer to host system for actual result text.

## 2016-03-31 IMAGING — CR DG CHEST 1V PORT
1 series · 1 of 1 positions shown · non-contrast
Comparison: [DATE]

CLINICAL DATA: Chest pain

EXAM:
PORTABLE CHEST 1 VIEW

[ap]
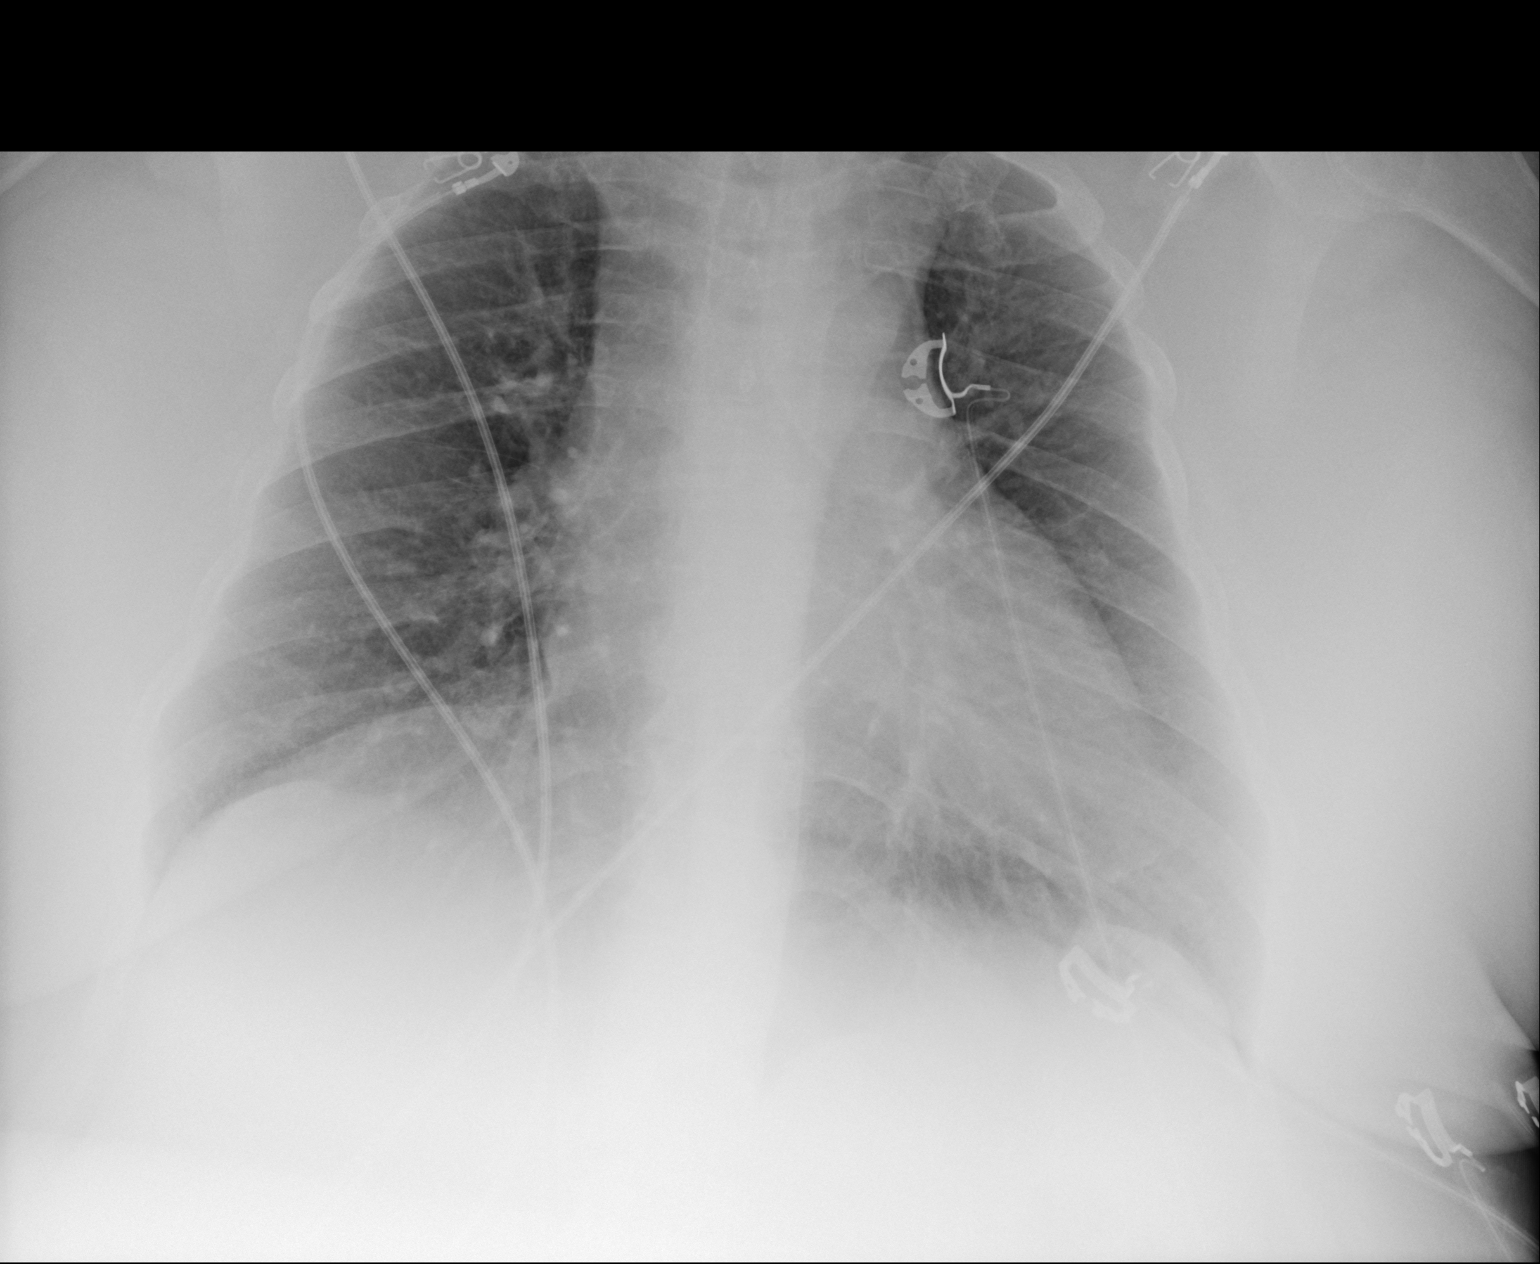

[1 of 1 positions shown; findings below may reference images not displayed]

FINDINGS: Chronic cardiomegaly and upper mediastinal widening. There is no
edema, consolidation, effusion, or pneumothorax. No acute osseous
finding.
IMPRESSION: Stable.  No evidence of acute disease.

## 2016-03-31 MED ORDER — POTASSIUM CHLORIDE 10 MEQ/100ML IV SOLN
10.0000 meq | Freq: Once | INTRAVENOUS | Status: AC
Start: 2016-03-31 — End: 2016-04-01
  Administered 2016-03-31: 10 meq via INTRAVENOUS
  Filled 2016-03-31: qty 100

## 2016-03-31 MED ORDER — REGADENOSON 0.4 MG/5ML IV SOLN
INTRAVENOUS | Status: AC
  Administered 2016-03-31: 0.4 mg via INTRAVENOUS
  Filled 2016-03-31: qty 5

## 2016-03-31 MED ORDER — POTASSIUM CHLORIDE CRYS ER 20 MEQ PO TBCR
40.0000 meq | EXTENDED_RELEASE_TABLET | Freq: Once | ORAL | Status: AC
  Administered 2016-03-31: 40 meq via ORAL
  Filled 2016-03-31: qty 2

## 2016-03-31 MED ORDER — SODIUM CHLORIDE 0.9 % IV BOLUS (SEPSIS)
1000.0000 mL | Freq: Once | INTRAVENOUS | Status: AC
  Administered 2016-03-31: 1000 mL via INTRAVENOUS

## 2016-03-31 MED ORDER — SODIUM CHLORIDE 0.9% FLUSH
INTRAVENOUS | Status: AC
  Administered 2016-03-31: 10 mL via INTRAVENOUS
  Filled 2016-03-31: qty 10

## 2016-03-31 MED ORDER — TECHNETIUM TC 99M SESTAMIBI GENERIC - CARDIOLITE
30.0000 | Freq: Once | INTRAVENOUS | Status: AC | PRN
  Administered 2016-03-31: 30 via INTRAVENOUS

## 2016-03-31 NOTE — ED Notes (Signed)
CRITICAL VALUE ALERT  Critical value received: 2314  Date of notification:  03/31/16  Time of notification:  2314  Critical value read back:Yes.    Nurse who received alert:  Rosalie Gums, RN  MD notified (1st page):  2315  Time of first page:  2315  MD notified (2nd page):  Time of second page:  Responding MD:  Dr Lita Mains  Time MD responded:  [phone]

## 2016-03-31 NOTE — ED Provider Notes (Signed)
CSN: [id number]     Arrival date & time 03/31/16  2158 History  By signing my name below, I, Nicole Kindred, attest that this documentation has been prepared under the direction and in the presence of Merryl Hacker, MD.   Electronically Signed: Nicole Kindred, ED Scribe. 04/01/2016. 2:31 AM    Chief Complaint  Patient presents with  . Chest Pain  . Shortness of Breath    The history is provided by the patient. No language interpreter was used.   HPI Comments: [name] is a 55 y.o. female with PMHx of HTN, diastolic dysfunction, and prolonged QT interval who presents to the Emergency Department complaining of Feeling funny earlier today. Pt reports chest pain, shortness of breath, dizziness, weakness, nausea, mild headache, and light-headedness. Pt received a stress test this morning where she experienced similar symptoms during stress test. The symptoms alleviated temporarily before returning tonight. She took her BP at home and it measured 77/44. When she tried to retake her BP she could not get a reading which prompted her to come to the ED. No other associated symptoms noted. No worsening or alleviating factors noted. Pt denies speech difficulty,  hx of blood clots, recent travel, recent hospitilzation, or any other pertinent symptoms. Pt states that all of her symptoms have alleviated and that she feels "fine" since arriving to the ED.   patient recently reports that she switch from spironolactone to metolazone 5 days ago. States "I think I overdid it." Reports that the swelling in her legs has improved drastically. However, she's not been drinking well today.  Past Medical History  Diagnosis Date  . Diabetes mellitus   . Hypertension   . Asthma   . Depression   . COPD (chronic obstructive pulmonary disease) ([place])   . Prolonged QT interval 05/14/2015  . Diastolic dysfunction [id number]   Past Surgical History  Procedure Laterality Date  . Cesarean section    .  Cholecystectomy    . Cataract extraction     Family History  Problem Relation Age of Onset  . Stroke Mother   . Diabetes Father   . Heart failure Father   . Hypertension Father   . Diabetes Sister   . Heart failure Sister   . Hypertension Sister   . Cancer Other    Social History  Substance Use Topics  . Smoking status: Passive Smoke Exposure - Never Smoker  . Smokeless tobacco: Never Used  . Alcohol Use: No   OB History    Gravida Para Term Preterm AB TAB SAB Ectopic Multiple Living   3 2 2  1  1   2      Review of Systems  Respiratory: Positive for chest tightness and shortness of breath.   Cardiovascular: Negative for chest pain and leg swelling.  Gastrointestinal: Positive for nausea.  Neurological: Positive for dizziness, light-headedness and headaches. Negative for speech difficulty.      Allergies  Benicar; Codeine; and Sulfa antibiotics  Home Medications   Prior to Admission medications   Medication Sig Start Date End Date Taking? Authorizing Provider  albuterol (PROAIR HFA) 108 (90 BASE) MCG/ACT inhaler Inhale 2 puffs into the lungs every 6 (six) hours as needed for wheezing or shortness of breath.   Yes Historical Provider, MD  amLODipine (NORVASC) 5 MG tablet Take 5 mg by mouth daily.  12/31/15  Yes Historical Provider, MD  atorvastatin (LIPITOR) 20 MG tablet Take 20 mg by mouth daily. 03/27/16  Yes Historical Provider, MD  escitalopram (LEXAPRO) 20 MG tablet Take 20 mg by mouth every evening.   Yes Historical Provider, MD  esomeprazole (NEXIUM) 20 MG capsule Take 20 mg by mouth daily at 12 noon.   Yes Historical Provider, MD  Fluticasone-Salmeterol (ADVAIR) 250-50 MCG/DOSE AEPB Inhale 1 puff into the lungs daily.   Yes Historical Provider, MD  insulin aspart (NOVOLOG FLEXPEN) 100 UNIT/ML FlexPen Inject 60 Units into the skin 3 (three) times daily with meals.   Yes Historical Provider, MD  levothyroxine (LEVOTHROID) 25 MCG tablet Take 25 mcg by mouth daily  before breakfast.  03/03/16 03/03/17 Yes Historical Provider, MD  lisinopril (PRINIVIL,ZESTRIL) 2.5 MG tablet TAKE ONE TABLET (2.5 MG TOTAL) BY MOUTH DAILY. 03/09/16  Yes Historical Provider, MD  LORazepam (ATIVAN) 1 MG tablet Take 1 mg by mouth at bedtime.  10/08/15  Yes Historical Provider, MD  metolazone (ZAROXOLYN) 5 MG tablet Take 1 tablet (5 mg total) by mouth daily as needed (5 pound weight gain only. If weight is stable, do not take it.). 05/16/15  Yes Samuella Cota, MD  propranolol ER (INDERAL LA) 120 MG 24 hr capsule Take 120 mg by mouth at bedtime.    Yes Historical Provider, MD  spironolactone (ALDACTONE) 25 MG tablet Take 1 tablet (25 mg total) by mouth daily. 05/16/15  Yes Samuella Cota, MD  torsemide (DEMADEX) 20 MG tablet Take 1 tablet (20 mg total) by mouth 2 (two) times daily. 05/16/15  Yes Samuella Cota, MD  TRESIBA FLEXTOUCH 200 UNIT/ML SOPN Inject 160 Units into the skin at bedtime. 03/27/16  Yes Historical Provider, MD  urea (CARMOL) 40 % CREA Apply 1 application topically daily as needed. Applied to abdomen, ankles,  and feet as needed 02/24/16  Yes Historical Provider, MD  Dulaglutide (TRULICITY) 1.5 0000000 SOPN Inject into the skin. 03/10/16   Historical Provider, MD  potassium chloride SA (K-DUR,KLOR-CON) 20 MEQ tablet Take 1 tablet (20 mEq total) by mouth 2 (two) times daily. 04/01/16   Merryl Hacker, MD   BP 112/51 mmHg  Pulse 77  Resp 16  SpO2 96%  LMP 08/06/2012 Physical Exam  Constitutional: She is oriented to person, place, and time. She appears well-developed and well-nourished.  Morbidly obese  HENT:  Head: Normocephalic and atraumatic.  Mouth/Throat: Oropharynx is clear and moist.  Eyes: Pupils are equal, round, and reactive to light.  Cardiovascular: Normal rate, regular rhythm and normal heart sounds.   No murmur heard. Pulmonary/Chest: Effort normal and breath sounds normal. No respiratory distress. She has no wheezes.  Abdominal: Soft. Bowel  sounds are normal. There is no tenderness. There is no rebound.  Musculoskeletal:  1+ lower extremity edema bilaterally  Neurological: She is alert and oriented to person, place, and time.  Cranial nerves II through XII intact, 5 out of 5 strength in all 4 extremities, no dysmetria to finger-nose-finger  Skin: Skin is warm and dry.  Psychiatric: She has a normal mood and affect.  Nursing note and vitals reviewed.   ED Course  Procedures (including critical care time) DIAGNOSTIC STUDIES: Oxygen Saturation is 96% on RA, adequate by my interpretation.    COORDINATION OF CARE: 11:01 PM-Discussed treatment plan which includes DG chest portable 1 view, BMP, CBC, troponin I, and EKG with pt at bedside and pt agreed to plan.    Labs Review Labs Reviewed  BASIC METABOLIC PANEL - Abnormal; Notable for the following:    Potassium 2.7 (*)    Chloride 91 (*)  Glucose, Bld 340 (*)    BUN 29 (*)    Creatinine, Ser 1.66 (*)    GFR calc non Af Amer 34 (*)    GFR calc Af Amer 39 (*)    All other components within normal limits  CBC - Abnormal; Notable for the following:    WBC 14.7 (*)    All other components within normal limits  BASIC METABOLIC PANEL - Abnormal; Notable for the following:    Potassium 3.0 (*)    Chloride 95 (*)    Glucose, Bld 295 (*)    BUN 29 (*)    Creatinine, Ser 1.48 (*)    Calcium 8.5 (*)    GFR calc non Af Amer 39 (*)    GFR calc Af Amer 45 (*)    All other components within normal limits  TROPONIN I  TROPONIN I    Imaging Review Dg Chest Portable 1 View  03/31/2016  CLINICAL DATA:  Chest pain EXAM: PORTABLE CHEST 1 VIEW COMPARISON:  05/14/2015 FINDINGS: Chronic cardiomegaly and upper mediastinal widening. There is no edema, consolidation, effusion, or pneumothorax. No acute osseous finding. IMPRESSION: Stable.  No evidence of acute disease. Electronically Signed   By: Monte Fantasia M.D.   On: 03/31/2016 22:34   I have personally reviewed and evaluated  these images and lab results as part of my medical decision-making.   EKG Interpretation   Date/Time:  Wednesday March 31 2016 22:12:22 EDT Ventricular Rate:  76 PR Interval:  127 QRS Duration: 98 QT Interval:  476 QTC Calculation: 535 R Axis:   -24 Text Interpretation:  Sinus rhythm Borderline left axis deviation Low  voltage, precordial leads Prolonged QT interval Confirmed by HORTON  MD,  Loma Sousa ([id number]) on 03/31/2016 10:53:40 PM      MDM   Final diagnoses:  Near syncope  Dehydration  Hypokalemia  AKI (acute kidney injury) ([place])   Patient presents with near syncopal episode. She states that she felt funny and similar to when she had her stress test earlier today. Symptoms including chest pain shortness of breath, near syncope, dizziness. She is nontoxic on exam. Vital signs notable for blood pressure of 93/47. Patient stopped taking her spironolactone and is now taking metolazone. She is not orthostatic. EKG is unchanged from prior. She has a known prolonged QT interval. Chest x-ray and troponin 2 are negative. I reviewed her stress test. It has not been officially read but it appears she did not have any ischemic changes on her EKG during stress testing. Lab work is notable for hypokalemia and acute kidney injury with a creatinine of 1.66.  This likely reflects recent change in diuretic and daily metolazone use. Patient was given fluids. History of diastolic heart failure with an EF of 60-65%. She was also given oral and IV potassium. Recheck of her BMP with a potassium of 3.0 and a creatinine that is improving with hydration.   2:33 AM Patient states that she feels much improved. She is able to ambulate independently. Blood pressures improved. Discussed with patient that she needs to discontinue taking metolazone for at least the next 2 days as well as her Lipitor given her acute kidney injury and dehydration. She needs follow-up with primary physician for blood pressure recheck,  kidney function rechecked, and potassium. Follow-up with cardiology later today as scheduled.  After history, exam, and medical workup I feel the patient has been appropriately medically screened and is safe for discharge home. Pertinent diagnoses were discussed with the  patient. Patient was given return precautions.  I personally performed the services described in this documentation, which was scribed in my presence. The recorded information has been reviewed and is accurate.    Merryl Hacker, MD 04/01/16 [phone]

## 2016-03-31 NOTE — ED Notes (Signed)
Patient states she had a stress test this morning and has had shortness of breath and chest tightness radiating into bilateral shoulders and neck since test. States pain went away and returned 1 1/2 hours ago.

## 2016-04-01 ENCOUNTER — Encounter (HOSPITAL_COMMUNITY): Payer: Self-pay

## 2016-04-01 ENCOUNTER — Encounter (HOSPITAL_COMMUNITY)
Admission: RE | Admit: 2016-04-01 | Discharge: 2016-04-01 | Disposition: A | Discharge status: Home / Self Care | Payer: PRIVATE HEALTH INSURANCE | Source: Ambulatory Visit | Attending: Adult Health | Admitting: Adult Health

## 2016-04-01 DIAGNOSIS — R0609 Other forms of dyspnea: Secondary | ICD-10-CM

## 2016-04-01 DIAGNOSIS — I10 Essential (primary) hypertension: Secondary | ICD-10-CM

## 2016-04-01 LAB — NM MYOCAR MULTI W/SPECT W/WALL MOTION / EF
LV dias vol: 64 mL (ref 46–106)
LV sys vol: 17 mL
Peak HR: 78 {beats}/min
RATE: 0.66
Rest HR: 68 {beats}/min
SDS: 8
SRS: 0
SSS: 8
TID: 1.02

## 2016-04-01 LAB — BASIC METABOLIC PANEL
Anion gap: 13 (ref 5–15)
BUN: 29 mg/dL — ABNORMAL HIGH (ref 6–20)
CO2: 31 mmol/L (ref 22–32)
Calcium: 8.5 mg/dL — ABNORMAL LOW (ref 8.9–10.3)
Chloride: 95 mmol/L — ABNORMAL LOW (ref 101–111)
Creatinine, Ser: 1.48 mg/dL — ABNORMAL HIGH (ref 0.44–1.00)
GFR calc Af Amer: 45 mL/min — ABNORMAL LOW (ref >60)
GFR calc non Af Amer: 39 mL/min — ABNORMAL LOW (ref >60)
Glucose, Bld: 295 mg/dL — ABNORMAL HIGH (ref 65–99)
Potassium: 3 mmol/L — ABNORMAL LOW (ref 3.5–5.1)
Sodium: 139 mmol/L (ref 135–145)

## 2016-04-01 LAB — TROPONIN I: Troponin I: <0.03 ng/mL (ref <0.031)

## 2016-04-01 MED ORDER — POTASSIUM CHLORIDE 10 MEQ/100ML IV SOLN
10.0000 meq | Freq: Once | INTRAVENOUS | Status: AC
Start: 2016-04-01 — End: 2016-04-01
  Administered 2016-04-01: 10 meq via INTRAVENOUS
  Filled 2016-04-01: qty 100

## 2016-04-01 MED ORDER — POTASSIUM CHLORIDE CRYS ER 20 MEQ PO TBCR: 20.0000 meq | EXTENDED_RELEASE_TABLET | Freq: Two times a day (BID) | ORAL | Status: DC

## 2016-04-01 MED ORDER — TECHNETIUM TC 99M SESTAMIBI GENERIC - CARDIOLITE
30.0000 | Freq: Once | INTRAVENOUS | Status: AC | PRN
  Administered 2016-04-01: 30 via INTRAVENOUS

## 2016-04-01 NOTE — Discharge Instructions (Signed)
You were found to have dehydration and low potassium. This is likely the cause of your symptoms. You need to discontinue taking metolazone for at least the next 2 days until you have rechecked by her primary physician. Continue potassium supplementation at home. You also need to make sure that you are hydrating appropriately. You need to have your primary physician check your blood pressure, kidney function, and potassium in the next 2-3 days. Also do not take your lisinopril until recheck.  Near-Syncope Near-syncope (commonly known as near fainting) is sudden weakness, dizziness, or feeling like you might pass out. During an episode of near-syncope, you may also develop pale skin, have tunnel vision, or feel sick to your stomach (nauseous). Near-syncope may occur when getting up after sitting or while standing for a long time. It is caused by a sudden decrease in blood flow to the brain. This decrease can result from various causes or triggers, most of which are not serious. However, because near-syncope can sometimes be a sign of something serious, a medical evaluation is required. The specific cause is often not determined. HOME CARE INSTRUCTIONS  Monitor your condition for any changes. The following actions may help to alleviate any discomfort you are experiencing:  Have someone stay with you until you feel stable.  Lie down right away and prop your feet up if you start feeling like you might faint. Breathe deeply and steadily. Wait until all the symptoms have passed. Most of these episodes last only a few minutes. You may feel tired for several hours.   Drink enough fluids to keep your urine clear or pale yellow.   If you are taking blood pressure or heart medicine, get up slowly when seated or lying down. Take several minutes to sit and then stand. This can reduce dizziness.  Follow up with your health care provider as directed. SEEK IMMEDIATE MEDICAL CARE IF:   You have a severe headache.    You have unusual pain in the chest, abdomen, or back.   You are bleeding from the mouth or rectum, or you have black or tarry stool.   You have an irregular or very fast heartbeat.   You have repeated fainting or have seizure-like jerking during an episode.   You faint when sitting or lying down.   You have confusion.   You have difficulty walking.   You have severe weakness.   You have vision problems.  MAKE SURE YOU:   Understand these instructions.  Will watch your condition.  Will get help right away if you are not doing well or get worse.   This information is not intended to replace advice given to you by your health care provider. Make sure you discuss any questions you have with your health care provider.   Document Released: 11/22/2005 Document Revised: 11/27/2013 Document Reviewed: 04/27/2013 Elsevier Interactive Patient Education 2016 Elsevier Inc.   Dehydration, Adult Dehydration is a condition in which you do not have enough fluid or water in your body. It happens when you take in less fluid than you lose. Vital organs such as the kidneys, brain, and heart cannot function without a proper amount of fluids. Any loss of fluids from the body can cause dehydration.  Dehydration can range from mild to severe. This condition should be treated right away to help prevent it from becoming severe. CAUSES  This condition may be caused by:  Vomiting.  Diarrhea.  Excessive sweating, such as when exercising in hot or humid weather.  Not drinking enough fluid during strenuous exercise or during an illness.  Excessive urine output.  Fever.  Certain medicines. RISK FACTORS This condition is more likely to develop in:  People who are taking certain medicines that cause the body to lose excess fluid (diuretics).   People who have a chronic illness, such as diabetes, that may increase urination.  Older adults.   People who live at high altitudes.    People who participate in endurance sports.  SYMPTOMS  Mild Dehydration  Thirst.  Dry lips.  Slightly dry mouth.  Dry, warm skin. Moderate Dehydration  Very dry mouth.   Muscle cramps.   Dark urine and decreased urine production.   Decreased tear production.   Headache.   Light-headedness, especially when you stand up from a sitting position.  Severe Dehydration  Changes in skin.   Cold and clammy skin.   Skin does not spring back quickly when lightly pinched and released.   Changes in body fluids.   Extreme thirst.   No tears.   Not able to sweat when body temperature is high, such as in hot weather.   Minimal urine production.   Changes in vital signs.   Rapid, weak pulse (more than 100 beats per minute when you are sitting still).   Rapid breathing.   Low blood pressure.   Other changes.   Sunken eyes.   Cold hands and feet.   Confusion.  Lethargy and difficulty being awakened.  Fainting (syncope).   Short-term weight loss.   Unconsciousness. DIAGNOSIS  This condition may be diagnosed based on your symptoms. You may also have tests to determine how severe your dehydration is. These tests may include:   Urine tests.   Blood tests.  TREATMENT  Treatment for this condition depends on the severity. Mild or moderate dehydration can often be treated at home. Treatment should be started right away. Do not wait until dehydration becomes severe. Severe dehydration needs to be treated at the hospital. Treatment for Mild Dehydration  Drinking plenty of water to replace the fluid you have lost.   Replacing minerals in your blood (electrolytes) that you may have lost.  Treatment for Moderate Dehydration  Consuming oral rehydration solution (ORS). Treatment for Severe Dehydration  Receiving fluid through an IV tube.   Receiving electrolyte solution through a feeding tube that is passed through your nose and  into your stomach (nasogastric tube or NG tube).  Correcting any abnormalities in electrolytes. HOME CARE INSTRUCTIONS   Drink enough fluid to keep your urine clear or pale yellow.   Drink water or fluid slowly by taking small sips. You can also try sucking on ice cubes.  Have food or beverages that contain electrolytes. Examples include bananas and sports drinks.  Take over-the-counter and prescription medicines only as told by your health care provider.   Prepare ORS according to the manufacturer's instructions. Take sips of ORS every 5 minutes until your urine returns to normal.  If you have vomiting or diarrhea, continue to try to drink water, ORS, or both.   If you have diarrhea, avoid:   Beverages that contain caffeine.   Fruit juice.   Milk.   Carbonated soft drinks.  Do not take salt tablets. This can lead to the condition of having too much sodium in your body (hypernatremia).  SEEK MEDICAL CARE IF:  You cannot eat or drink without vomiting.  You have had moderate diarrhea during a period of more than 24 hours.  You have  a fever. SEEK IMMEDIATE MEDICAL CARE IF:   You have extreme thirst.  You have severe diarrhea.  You have not urinated in 6-8 hours, or you have urinated only a small amount of very dark urine.  You have shriveled skin.  You are dizzy, confused, or both.   This information is not intended to replace advice given to you by your health care provider. Make sure you discuss any questions you have with your health care provider.   Document Released: 11/22/2005 Document Revised: 08/13/2015 Document Reviewed: 04/09/2015 Elsevier Interactive Patient Education 2016 Elsevier Inc. Acute Kidney Injury Acute kidney injury is any condition in which there is sudden (acute) damage to the kidneys. Acute kidney injury was previously known as acute kidney failure or acute renal failure. The kidneys are two organs that lie on either side of the  spine between the middle of the back and the front of the abdomen. The kidneys:  Remove wastes and extra water from the blood.   Produce important hormones. These help keep bones strong, regulate blood pressure, and help create red blood cells.   Balance the fluids and chemicals in the blood and tissues. A small amount of kidney damage may not cause problems, but a large amount of damage may make it difficult or impossible for the kidneys to work the way they should. Acute kidney injury may develop into long-lasting (chronic) kidney disease. It may also develop into a life-threatening disease called end-stage kidney disease. Acute kidney injury can get worse very quickly, so it should be treated right away. Early treatment may prevent other kidney diseases from developing. CAUSES   A problem with blood flow to the kidneys. This may be caused by:   Blood loss.   Heart disease.   Severe burns.   Liver disease.  Direct damage to the kidneys. This may be caused by:  Some medicines.   A kidney infection.   Poisoning or consuming toxic substances.   A surgical wound.   A blow to the kidney area.   A problem with urine flow. This may be caused by:   Cancer.   Kidney stones.   An enlarged prostate. SIGNS AND SYMPTOMS   Swelling (edema) of the legs, ankles, or feet.   Tiredness (lethargy).   Nausea or vomiting.   Confusion.   Problems with urination, such as:   Painful or burning feeling during urination.   Decreased urine production.   Frequent accidents in children who are potty trained.   Bloody urine.   Muscle twitches and cramps.   Shortness of breath.   Seizures.   Chest pain or pressure. Sometimes, no symptoms are present. DIAGNOSIS Acute kidney injury may be detected and diagnosed by tests, including blood, urine, imaging, or kidney biopsy tests.  TREATMENT Treatment of acute kidney injury varies depending on the cause  and severity of the kidney damage. In mild cases, no treatment may be needed. The kidneys may heal on their own. If acute kidney injury is more severe, your health care provider will treat the cause of the kidney damage, help the kidneys heal, and prevent complications from occurring. Severe cases may require a procedure to remove toxic wastes from the body (dialysis) or surgery to repair kidney damage. Surgery may involve:   Repair of a torn kidney.   Removal of an obstruction. HOME CARE INSTRUCTIONS  Follow your prescribed diet.  Take medicines only as directed by your health care provider.  Do not take any new medicines (prescription,  over-the-counter, or nutritional supplements) unless approved by your health care provider. Many medicines can worsen your kidney damage or may need to have the dose adjusted.   Keep all follow-up visits as directed by your health care provider. This is important.  Observe your condition to make sure you are healing as expected. SEEK IMMEDIATE MEDICAL CARE IF:  You are feeling ill or have severe pain in the back or side.   Your symptoms return or you have new symptoms.  You have any symptoms of end-stage kidney disease. These include:   Persistent itchiness.   Loss of appetite.   Headaches.   Abnormally dark or light skin.  Numbness in the hands or feet.   Easy bruising.   Frequent hiccups.   Menstruation stops.   You have a fever.  You have increased urine production.  You have pain or bleeding when urinating. MAKE SURE YOU:   Understand these instructions.  Will watch your condition.  Will get help right away if you are not doing well or get worse.   This information is not intended to replace advice given to you by your health care provider. Make sure you discuss any questions you have with your health care provider.   Document Released: 06/07/2011 Document Revised: 12/13/2014 Document Reviewed:  07/21/2012 Elsevier Interactive Patient Education [employer].

## 2016-04-06 ENCOUNTER — Telehealth: Payer: Self-pay

## 2016-04-07 ENCOUNTER — Telehealth: Payer: Self-pay

## 2016-04-07 NOTE — Telephone Encounter (Signed)
-----   Message from Drema Dallas, [place] sent at 03/31/2016  4:47 PM EDT ----- Regarding: lexi / sleep study  4/26

## 2016-04-07 NOTE — Telephone Encounter (Signed)
Called pt, and left message with husband to have her call us back.

## 2016-04-08 ENCOUNTER — Telehealth: Payer: Self-pay

## 2016-04-08 NOTE — Telephone Encounter (Signed)
Have called pt and left 3 messages with her husband for her to return my call to set up her tests. Will mail pt a letter.

## 2016-04-08 NOTE — Telephone Encounter (Signed)
-----   Message from Drema Dallas, [place] sent at 03/31/2016  4:47 PM EDT ----- Regarding: lexi / sleep study  4/26

## 2016-04-13 ENCOUNTER — Encounter (INDEPENDENT_AMBULATORY_CARE_PROVIDER_SITE_OTHER): Payer: Self-pay | Admitting: *Deleted

## 2016-04-19 ENCOUNTER — Encounter: Payer: Self-pay | Admitting: *Deleted

## 2016-04-19 ENCOUNTER — Ambulatory Visit (INDEPENDENT_AMBULATORY_CARE_PROVIDER_SITE_OTHER): Payer: PRIVATE HEALTH INSURANCE | Admitting: Adult Health

## 2016-04-19 ENCOUNTER — Encounter: Payer: Self-pay | Admitting: Adult Health

## 2016-04-19 VITALS — BP 132/70 | HR 70 | Ht 61.0 in | Wt 301.0 lb

## 2016-04-19 DIAGNOSIS — J449 Chronic obstructive pulmonary disease, unspecified: Secondary | ICD-10-CM

## 2016-04-19 DIAGNOSIS — R0789 Other chest pain: Secondary | ICD-10-CM | POA: Diagnosis not present

## 2016-04-19 NOTE — Progress Notes (Signed)
Cardiology Office Note   Date:  04/19/2016   ID:  [name], DOB [birth year], MRN [id number]  PCP:  Sherrie Mustache, MD  Cardiologist: Tp be established [name], NP   No chief complaint on file.     History of Present Illness: [name] is a 55 y.o. female who presents for ongoing assessment and management of chronic diastolic heart failure, history of prolonged QT interval in the setting of symptomatic hypokalemia, normal LV systolic function with an EF of 60-65%, with other history to include COPD hypertension and diabetes. The patient was last seen in the office in April of 2017 having gained weight become more fatigued more short of breath with mild chest pressure. The patient does not have a CPAP machine.  On last office visit he was scheduled for a  LexiScan Cardiolite due to multiple cardiovascular risk factors. She was referred to pulmonology for ongoing evaluation of COPD and need for CPAP.stress test was completed on 04/01/2016.   There was no ST segment deviation noted during stress.  The study is normal.  This is a low risk study.  Nuclear stress EF: 73%.  And she is without cardiac complaints at this time. She did have bilateral ABIs completed at [facility] with results pending.breathing status has not improved.  Past Medical History  Diagnosis Date  . Diabetes mellitus   . Hypertension   . Asthma   . Depression   . COPD (chronic obstructive pulmonary disease) ([place])   . Prolonged QT interval 05/14/2015  . Diastolic dysfunction [id number]    Past Surgical History  Procedure Laterality Date  . Cesarean section    . Cholecystectomy    . Cataract extraction       Current Outpatient Prescriptions  Medication Sig Dispense Refill  . albuterol (PROAIR HFA) 108 (90 BASE) MCG/ACT inhaler Inhale 2 puffs into the lungs every 6 (six) hours as needed for wheezing or shortness of breath.    Marland Kitchen amLODipine (NORVASC) 5 MG tablet Take 5  mg by mouth daily.     Marland Kitchen atorvastatin (LIPITOR) 20 MG tablet Take 20 mg by mouth daily.  11  . Dulaglutide (TRULICITY) 1.5 0000000 SOPN Inject into the skin.    Marland Kitchen escitalopram (LEXAPRO) 20 MG tablet Take 20 mg by mouth every evening.    Marland Kitchen esomeprazole (NEXIUM) 20 MG capsule Take 20 mg by mouth daily at 12 noon.    . Fluticasone-Salmeterol (ADVAIR) 250-50 MCG/DOSE AEPB Inhale 1 puff into the lungs daily.    . insulin aspart (NOVOLOG FLEXPEN) 100 UNIT/ML FlexPen Inject 60 Units into the skin 3 (three) times daily with meals.    Marland Kitchen levothyroxine (LEVOTHROID) 25 MCG tablet Take 25 mcg by mouth daily before breakfast.     . LORazepam (ATIVAN) 1 MG tablet Take 1 mg by mouth at bedtime.     . potassium chloride SA (K-DUR,KLOR-CON) 20 MEQ tablet Take 1 tablet (20 mEq total) by mouth 2 (two) times daily. 30 tablet 0  . propranolol ER (INDERAL LA) 120 MG 24 hr capsule Take 120 mg by mouth at bedtime.     Marland Kitchen spironolactone (ALDACTONE) 25 MG tablet Take 1 tablet (25 mg total) by mouth daily. 30 tablet 0  . torsemide (DEMADEX) 20 MG tablet Take 1 tablet (20 mg total) by mouth 2 (two) times daily. 60 tablet 0  . TRESIBA FLEXTOUCH 200 UNIT/ML SOPN Inject 160 Units into the skin at bedtime.  5  . urea (CARMOL) 40 % CREA Apply  1 application topically daily as needed. Applied to abdomen, ankles,  and feet as needed  2   No current facility-administered medications for this visit.    Allergies:   Benicar; Codeine; and Sulfa antibiotics    Social History:  The patient  reports that she has been passively smoking.  She has never used smokeless tobacco. She reports that she does not drink alcohol or use illicit drugs.   Family History:  The patient's family history includes Cancer in her other; Diabetes in her father and sister; Heart failure in her father and sister; Hypertension in her father and sister; Stroke in her mother.    ROS: All other systems are reviewed and negative. Unless otherwise mentioned in  H&P    PHYSICAL EXAM: VS:  BP 132/70 mmHg  Pulse 70  Ht 5\' 1"  (1.549 m)  Wt 301 lb (136.533 kg)  BMI 56.90 kg/m2  SpO2 94%  LMP 08/06/2012 , BMI Body mass index is 56.9 kg/(m^2). GEN: Well nourished, well developed, in no acute distressmorbidly obese. HEENT: normal Neck: no JVD, carotid bruits, or masses Cardiac: RRR; no murmurs, rubs, or gallops,no edema  Respiratory:  Clear to auscultation bilaterally, normal work of breathing.diminished in the bases. GI: soft, nontender, nondistended, + BS MS: no deformity or atrophy Skin: warm and dry, no rash Neuro:  Strength and sensation are intact Psych: euthymic mood, full affect   Recent Labs: 05/14/2015: B Natriuretic Peptide 24.0 05/16/2015: Magnesium 2.0 03/31/2016: Hemoglobin 13.6; Platelets 330 04/01/2016: BUN 29*; Creatinine, Ser 1.48*; Potassium 3.0*; Sodium 139    Lipid Panel No results found for: CHOL, TRIG, HDL, CHOLHDL, VLDL, LDLCALC, LDLDIRECT    Wt Readings from Last 3 Encounters:  04/19/16 301 lb (136.533 kg)  03/16/16 301 lb (136.533 kg)  05/23/15 288 lb (130.636 kg)        ASSESSMENT AND PLAN:  1.  Chest pain:stress MPI was reassuring, and discussed with the patient. Despite multiple cardiovascular risk factors there was no evidence of ischemia. She is to continue her current medication regimen.  2. Chronic diastolic CHF:due to renal insufficiency, metolazone and lisinopril were discontinued by her primary care. Recent labs revealed a potassium of 3.0 with repeat labs completed this week, primary care physician is managing labs requesting a copy. No evidence of fluid overload at this time.  3. COPD:the patient requests referral to pulmonologist. This had been discussed on previous office visit. Now the stress test was found to be normal, we'll refer her to Dr. Luan Pulling local pulmonologist.Mr. Cletus Gash he's U.  4. Morbid obesity: she feels better knowing that her chest pain is not related to coronary disease that  could be identified by MPI. She is determined now to begin an exercise program and weight loss. She is encouraged to do so.  Current medicines are reviewed at length with the patient today.    Labs/ tests ordered today include:   Orders Placed This Encounter  Procedures  . Ambulatory referral to Pulmonology     Disposition:   FU with 6 months.  Signed, [name], NP  04/19/2016 4:15 PM    [facility] [address] [place], [place] [address] Phone: [phone]; Fax: [phone]

## 2016-04-19 NOTE — Patient Instructions (Signed)
Your physician wants you to follow-up in: 6 Months You will receive a reminder letter in the mail two months in advance. If you don't receive a letter, please call our office to schedule the follow-up appointment.  Your physician recommends that you continue on your current medications as directed. Please refer to the Current Medication list given to you today.  You have been referred to Dr. Luan Pulling  If you need a refill on your cardiac medications before your next appointment, please call your pharmacy.  Thank you for choosing [facility]!

## 2016-04-19 NOTE — Progress Notes (Deleted)
Name: [name]    DOB: 1961-04-30  Age: 55 y.o.  MR#: [id number]       PCP:  Sherrie Mustache, MD      Insurance: Payor: MEDCOST / Plan: MEDCOST / Product Type: *No Product type* /   CC:   No chief complaint on file.   VS Filed Vitals:   04/19/16 1525  BP: 132/70  Pulse: 70  Height: 5\' 1"  (1.549 m)  Weight: 301 lb (136.533 kg)  SpO2: 94%    Weights Current Weight  04/19/16 301 lb (136.533 kg)  03/16/16 301 lb (136.533 kg)  05/23/15 288 lb (130.636 kg)    Blood Pressure  BP Readings from Last 3 Encounters:  04/19/16 132/70  04/01/16 114/57  03/16/16 122/68     Admit date:  (Not on file) Last encounter with RMR:  03/16/2016   Allergy Benicar; Codeine; and Sulfa antibiotics  Current Outpatient Prescriptions  Medication Sig Dispense Refill  . albuterol (PROAIR HFA) 108 (90 BASE) MCG/ACT inhaler Inhale 2 puffs into the lungs every 6 (six) hours as needed for wheezing or shortness of breath.    Marland Kitchen amLODipine (NORVASC) 5 MG tablet Take 5 mg by mouth daily.     Marland Kitchen atorvastatin (LIPITOR) 20 MG tablet Take 20 mg by mouth daily.  11  . Dulaglutide (TRULICITY) 1.5 0000000 SOPN Inject into the skin.    Marland Kitchen escitalopram (LEXAPRO) 20 MG tablet Take 20 mg by mouth every evening.    Marland Kitchen esomeprazole (NEXIUM) 20 MG capsule Take 20 mg by mouth daily at 12 noon.    . Fluticasone-Salmeterol (ADVAIR) 250-50 MCG/DOSE AEPB Inhale 1 puff into the lungs daily.    . insulin aspart (NOVOLOG FLEXPEN) 100 UNIT/ML FlexPen Inject 60 Units into the skin 3 (three) times daily with meals.    Marland Kitchen levothyroxine (LEVOTHROID) 25 MCG tablet Take 25 mcg by mouth daily before breakfast.     . lisinopril (PRINIVIL,ZESTRIL) 2.5 MG tablet TAKE ONE TABLET (2.5 MG TOTAL) BY MOUTH DAILY.  5  . LORazepam (ATIVAN) 1 MG tablet Take 1 mg by mouth at bedtime.     . metolazone (ZAROXOLYN) 5 MG tablet Take 1 tablet (5 mg total) by mouth daily as needed (5 pound weight gain only. If weight is stable, do not take it.).     Marland Kitchen potassium chloride SA (K-DUR,KLOR-CON) 20 MEQ tablet Take 1 tablet (20 mEq total) by mouth 2 (two) times daily. 30 tablet 0  . propranolol ER (INDERAL LA) 120 MG 24 hr capsule Take 120 mg by mouth at bedtime.     Marland Kitchen spironolactone (ALDACTONE) 25 MG tablet Take 1 tablet (25 mg total) by mouth daily. 30 tablet 0  . torsemide (DEMADEX) 20 MG tablet Take 1 tablet (20 mg total) by mouth 2 (two) times daily. 60 tablet 0  . TRESIBA FLEXTOUCH 200 UNIT/ML SOPN Inject 160 Units into the skin at bedtime.  5  . urea (CARMOL) 40 % CREA Apply 1 application topically daily as needed. Applied to abdomen, ankles,  and feet as needed  2   No current facility-administered medications for this visit.    Discontinued Meds:   There are no discontinued medications.  Patient Active Problem List   Diagnosis Date Noted  . Diastolic dysfunction Q000111Q  . Prolonged QT interval 05/14/2015  . Palpitations 07/23/2014  . Generalized weakness 07/23/2014  . Hypokalemia 07/22/2014  . Diabetes mellitus ([place]) 07/22/2014  . Hypertension 07/22/2014  . COPD (chronic obstructive pulmonary disease) ([place]) 07/22/2014  .  Depression 07/22/2014    LABS    Component Value Date/Time   NA 139 04/01/2016 0121   NA 137 03/31/2016 2228   NA 142 05/16/2015 0603   K 3.0* 04/01/2016 0121   K 2.7* 03/31/2016 2228   K 3.4* 05/16/2015 0603   CL 95* 04/01/2016 0121   CL 91* 03/31/2016 2228   CL 101 05/16/2015 0603   CO2 31 04/01/2016 0121   CO2 31 03/31/2016 2228   CO2 31 05/16/2015 0603   GLUCOSE 295* 04/01/2016 0121   GLUCOSE 340* 03/31/2016 2228   GLUCOSE 66 05/16/2015 0603   BUN 29* 04/01/2016 0121   BUN 29* 03/31/2016 2228   BUN 14 05/16/2015 0603   CREATININE 1.48* 04/01/2016 0121   CREATININE 1.66* 03/31/2016 2228   CREATININE 0.65 05/16/2015 0603   CALCIUM 8.5* 04/01/2016 0121   CALCIUM 9.0 03/31/2016 2228   CALCIUM 8.7* 05/16/2015 0603   GFRNONAA 39* 04/01/2016 0121   GFRNONAA 34* 03/31/2016 2228    GFRNONAA >60 05/16/2015 0603   GFRAA 45* 04/01/2016 0121   GFRAA 39* 03/31/2016 2228   GFRAA >60 05/16/2015 0603   CMP     Component Value Date/Time   NA 139 04/01/2016 0121   K 3.0* 04/01/2016 0121   CL 95* 04/01/2016 0121   CO2 31 04/01/2016 0121   GLUCOSE 295* 04/01/2016 0121   BUN 29* 04/01/2016 0121   CREATININE 1.48* 04/01/2016 0121   CALCIUM 8.5* 04/01/2016 0121   PROT 7.6 04/30/2013 1738   ALBUMIN 3.1* 04/30/2013 1738   AST 70* 04/30/2013 1738   ALT 57* 04/30/2013 1738   ALKPHOS 257* 04/30/2013 1738   BILITOT 0.6 04/30/2013 1738   GFRNONAA 39* 04/01/2016 0121   GFRAA 45* 04/01/2016 0121       Component Value Date/Time   WBC 14.7* 03/31/2016 2228   WBC 10.7* 05/14/2015 1536   WBC 12.0* 07/22/2014 1927   HGB 13.6 03/31/2016 2228   HGB 15.7* 05/14/2015 1536   HGB 16.4* 07/22/2014 1927   HCT 39.0 03/31/2016 2228   HCT 44.9 05/14/2015 1536   HCT 46.2* 07/22/2014 1927   MCV 90.3 03/31/2016 2228   MCV 92.2 05/14/2015 1536   MCV 91.3 07/22/2014 1927    Lipid Panel  No results found for: CHOL, TRIG, HDL, CHOLHDL, VLDL, LDLCALC, LDLDIRECT  ABG    Component Value Date/Time   HCO3 27.5* 06/15/2007 1909   TCO2 29 06/15/2007 1909     Lab Results  Component Value Date   TSH 3.765 ***Test methodology is 3rd generation TSH*** 06/15/2007   BNP (last 3 results)  Recent Labs  05/14/15 1536  BNP 24.0    ProBNP (last 3 results) No results for input(s): PROBNP in the last 8760 hours.  Cardiac Panel (last 3 results) No results for input(s): CKTOTAL, CKMB, TROPONINI, RELINDX in the last 72 hours.  Iron/TIBC/Ferritin/ %Sat No results found for: IRON, TIBC, FERRITIN, IRONPCTSAT   EKG Orders placed or performed during the hospital encounter of 03/31/16  . ED EKG  . ED EKG  . EKG     Prior Assessment and Plan Problem List as of 04/19/2016      Cardiovascular and Mediastinum   Hypertension   Prolonged QT interval     Respiratory   COPD (chronic  obstructive pulmonary disease) ([facility])     Endocrine   Diabetes mellitus ([facility])     Other   Hypokalemia   Depression   Palpitations   Generalized weakness   Diastolic dysfunction  Imaging: Nm Myocar Multi W/spect W/wall Motion / Ef  04/01/2016   There was no ST segment deviation noted during stress.  The study is normal.  This is a low risk study.  Nuclear stress EF: 73%.    Dg Chest Portable 1 View  03/31/2016  CLINICAL DATA:  Chest pain EXAM: PORTABLE CHEST 1 VIEW COMPARISON:  05/14/2015 FINDINGS: Chronic cardiomegaly and upper mediastinal widening. There is no edema, consolidation, effusion, or pneumothorax. No acute osseous finding. IMPRESSION: Stable.  No evidence of acute disease. Electronically Signed   By: Monte Fantasia M.D.   On: 03/31/2016 22:34

## 2016-06-17 ENCOUNTER — Emergency Department (HOSPITAL_COMMUNITY): Payer: PRIVATE HEALTH INSURANCE

## 2016-06-17 ENCOUNTER — Emergency Department (HOSPITAL_COMMUNITY)
Admission: EM | Admit: 2016-06-17 | Discharge: 2016-06-17 | Disposition: A | Discharge status: Home / Self Care | Payer: PRIVATE HEALTH INSURANCE | Attending: Emergency Medicine | Admitting: Emergency Medicine

## 2016-06-17 ENCOUNTER — Encounter (HOSPITAL_COMMUNITY): Payer: Self-pay | Admitting: Cardiology

## 2016-06-17 DIAGNOSIS — R319 Hematuria, unspecified: Secondary | ICD-10-CM | POA: Insufficient documentation

## 2016-06-17 DIAGNOSIS — Z794 Long term (current) use of insulin: Secondary | ICD-10-CM | POA: Insufficient documentation

## 2016-06-17 DIAGNOSIS — E876 Hypokalemia: Secondary | ICD-10-CM | POA: Insufficient documentation

## 2016-06-17 DIAGNOSIS — I10 Essential (primary) hypertension: Secondary | ICD-10-CM | POA: Insufficient documentation

## 2016-06-17 DIAGNOSIS — E119 Type 2 diabetes mellitus without complications: Secondary | ICD-10-CM | POA: Insufficient documentation

## 2016-06-17 DIAGNOSIS — J449 Chronic obstructive pulmonary disease, unspecified: Secondary | ICD-10-CM | POA: Diagnosis not present

## 2016-06-17 DIAGNOSIS — R0602 Shortness of breath: Secondary | ICD-10-CM | POA: Diagnosis not present

## 2016-06-17 DIAGNOSIS — Z79899 Other long term (current) drug therapy: Secondary | ICD-10-CM | POA: Insufficient documentation

## 2016-06-17 DIAGNOSIS — R197 Diarrhea, unspecified: Secondary | ICD-10-CM

## 2016-06-17 DIAGNOSIS — Z7722 Contact with and (suspected) exposure to environmental tobacco smoke (acute) (chronic): Secondary | ICD-10-CM | POA: Diagnosis not present

## 2016-06-17 DIAGNOSIS — R1110 Vomiting, unspecified: Secondary | ICD-10-CM

## 2016-06-17 DIAGNOSIS — J45909 Unspecified asthma, uncomplicated: Secondary | ICD-10-CM | POA: Diagnosis not present

## 2016-06-17 DIAGNOSIS — R112 Nausea with vomiting, unspecified: Secondary | ICD-10-CM | POA: Diagnosis present

## 2016-06-17 DIAGNOSIS — F329 Major depressive disorder, single episode, unspecified: Secondary | ICD-10-CM | POA: Insufficient documentation

## 2016-06-17 DIAGNOSIS — R3129 Other microscopic hematuria: Secondary | ICD-10-CM

## 2016-06-17 DIAGNOSIS — R109 Unspecified abdominal pain: Secondary | ICD-10-CM | POA: Diagnosis not present

## 2016-06-17 LAB — COMPREHENSIVE METABOLIC PANEL
ALT: 24 U/L (ref 14–54)
AST: 28 U/L (ref 15–41)
Albumin: 3.1 g/dL — ABNORMAL LOW (ref 3.5–5.0)
Alkaline Phosphatase: 149 U/L — ABNORMAL HIGH (ref 38–126)
Anion gap: 9 (ref 5–15)
BUN: 20 mg/dL (ref 6–20)
CO2: 34 mmol/L — ABNORMAL HIGH (ref 22–32)
Calcium: 8.4 mg/dL — ABNORMAL LOW (ref 8.9–10.3)
Chloride: 93 mmol/L — ABNORMAL LOW (ref 101–111)
Creatinine, Ser: 1.02 mg/dL — ABNORMAL HIGH (ref 0.44–1.00)
GFR calc Af Amer: >60 mL/min (ref >60)
GFR calc non Af Amer: >60 mL/min (ref >60)
Glucose, Bld: 196 mg/dL — ABNORMAL HIGH (ref 65–99)
Potassium: 3.1 mmol/L — ABNORMAL LOW (ref 3.5–5.1)
Sodium: 136 mmol/L (ref 135–145)
Total Bilirubin: 0.8 mg/dL (ref 0.3–1.2)
Total Protein: 6.8 g/dL (ref 6.5–8.1)

## 2016-06-17 LAB — CBC WITH DIFFERENTIAL/PLATELET
Basophils Absolute: 0 10*3/uL (ref 0.0–0.1)
Basophils Relative: 0 %
Eosinophils Absolute: 0.5 10*3/uL (ref 0.0–0.7)
Eosinophils Relative: 4 %
HCT: 45.4 % (ref 36.0–46.0)
Hemoglobin: 15.8 g/dL — ABNORMAL HIGH (ref 12.0–15.0)
Lymphocytes Relative: 16 %
Lymphs Abs: 1.9 10*3/uL (ref 0.7–4.0)
MCH: 32 pg (ref 26.0–34.0)
MCHC: 34.8 g/dL (ref 30.0–36.0)
MCV: 91.9 fL (ref 78.0–100.0)
Monocytes Absolute: 0.7 10*3/uL (ref 0.1–1.0)
Monocytes Relative: 6 %
Neutro Abs: 8.7 10*3/uL — ABNORMAL HIGH (ref 1.7–7.7)
Neutrophils Relative %: 74 %
Platelets: 333 10*3/uL (ref 150–400)
RBC: 4.94 MIL/uL (ref 3.87–5.11)
RDW: 14 % (ref 11.5–15.5)
WBC: 11.8 10*3/uL — ABNORMAL HIGH (ref 4.0–10.5)

## 2016-06-17 LAB — URINALYSIS, ROUTINE W REFLEX MICROSCOPIC
Bilirubin Urine: NEGATIVE
Glucose, UA: NEGATIVE mg/dL
Ketones, ur: NEGATIVE mg/dL
Leukocytes, UA: NEGATIVE
Nitrite: NEGATIVE
Specific Gravity, Urine: 1.01 (ref 1.005–1.030)
pH: 6 (ref 5.0–8.0)

## 2016-06-17 LAB — TROPONIN I: Troponin I: <0.03 ng/mL (ref <0.03)

## 2016-06-17 LAB — CBG MONITORING, ED: Glucose-Capillary: 232 mg/dL — ABNORMAL HIGH (ref 65–99)

## 2016-06-17 LAB — URINE MICROSCOPIC-ADD ON: WBC, UA: NONE SEEN WBC/hpf (ref 0–5)

## 2016-06-17 LAB — LIPASE, BLOOD: Lipase: 11 U/L (ref 11–51)

## 2016-06-17 IMAGING — DX DG CHEST 2V
2 series · 2 of 2 positions shown · non-contrast
Comparison: [DATE]

CLINICAL DATA: Palpitations

EXAM:
CHEST  2 VIEW

[chest pa]
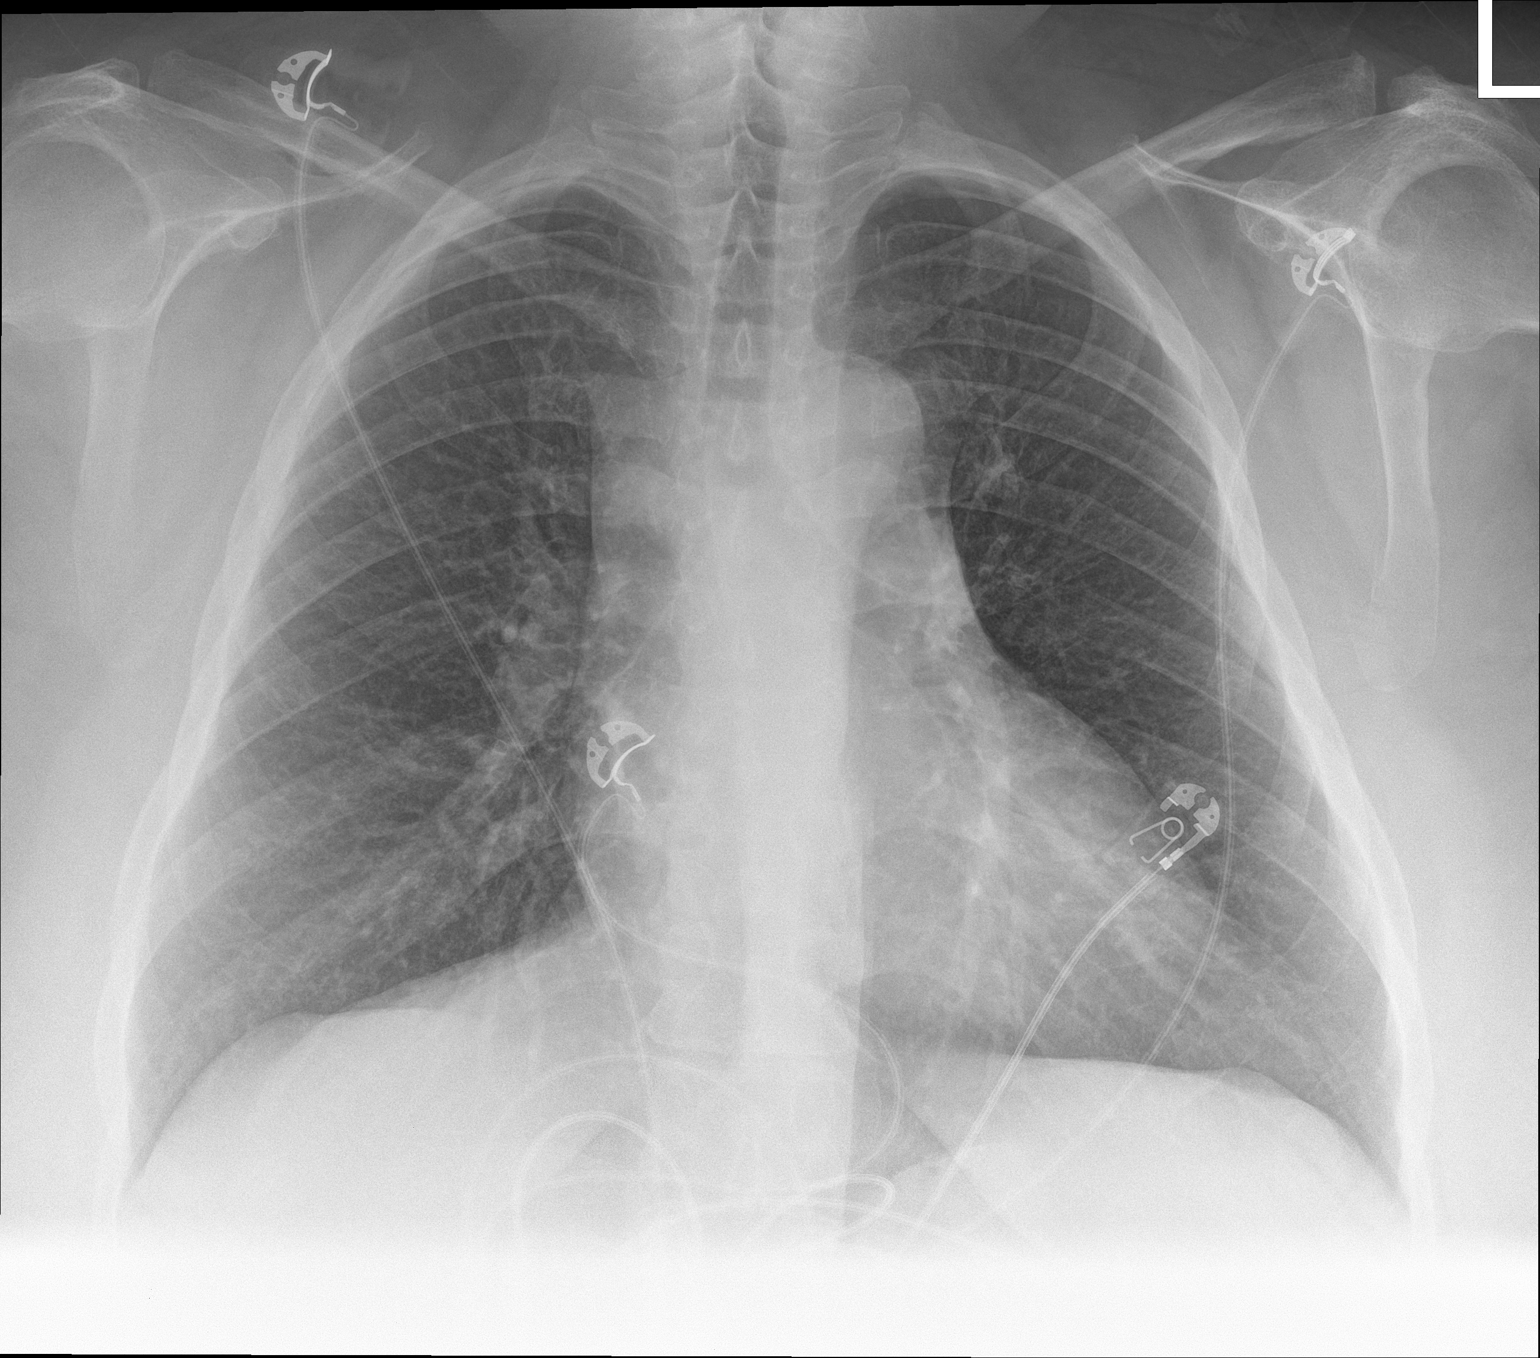

[chest lat]
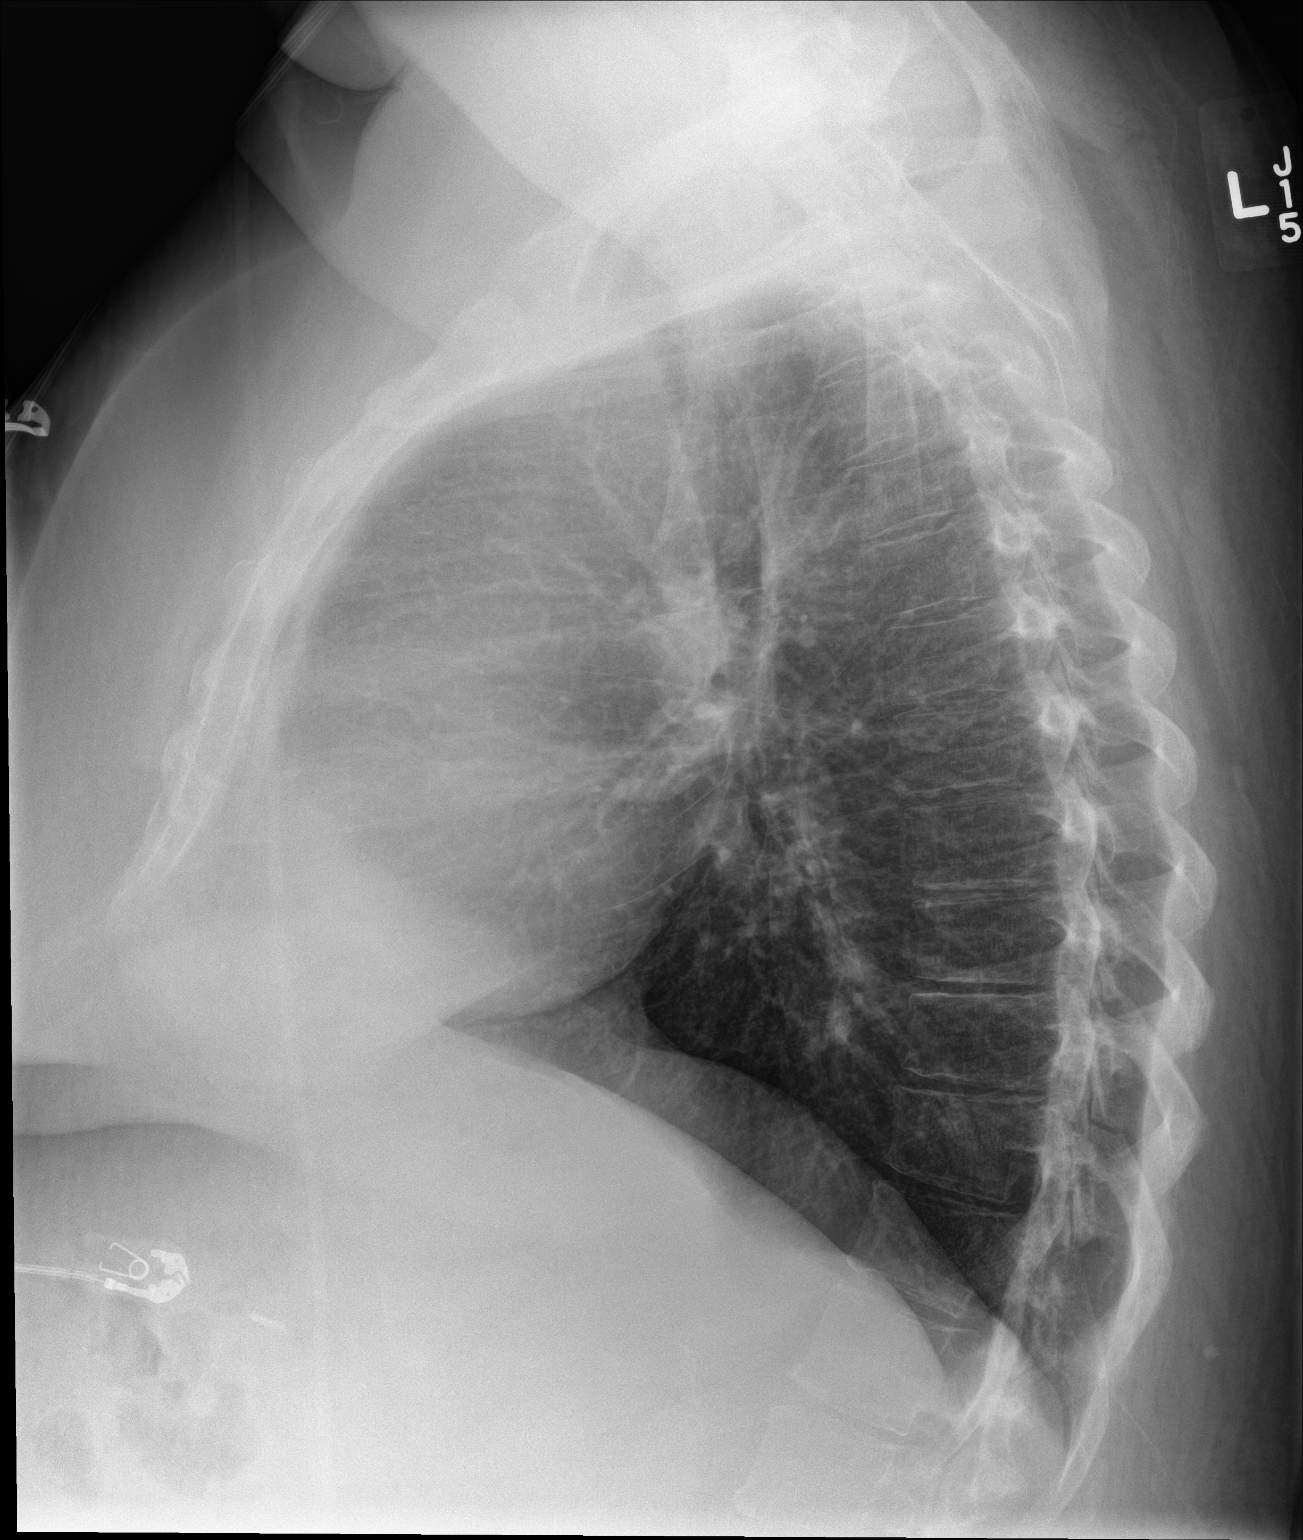

[2 of 2 positions shown; findings below may reference images not displayed]

FINDINGS: Cardiomediastinal silhouette is stable. No acute infiltrate or
pleural effusion. No pulmonary edema. Bony thorax is unremarkable.
IMPRESSION: No active cardiopulmonary disease.

## 2016-06-17 MED ORDER — DIPHENOXYLATE-ATROPINE 2.5-0.025 MG PO TABS: 1.0000 | ORAL_TABLET | Freq: Four times a day (QID) | ORAL | Status: DC | PRN

## 2016-06-17 MED ORDER — PROCHLORPERAZINE EDISYLATE 5 MG/ML IJ SOLN
10.0000 mg | Freq: Once | INTRAMUSCULAR | Status: AC
  Administered 2016-06-17: 10 mg via INTRAVENOUS
  Filled 2016-06-17: qty 2

## 2016-06-17 MED ORDER — PROCHLORPERAZINE MALEATE 10 MG PO TABS: 10.0000 mg | ORAL_TABLET | Freq: Two times a day (BID) | ORAL | Status: DC | PRN

## 2016-06-17 MED ORDER — POTASSIUM CHLORIDE CRYS ER 20 MEQ PO TBCR
40.0000 meq | EXTENDED_RELEASE_TABLET | Freq: Once | ORAL | Status: AC
  Administered 2016-06-17: 40 meq via ORAL
  Filled 2016-06-17: qty 2

## 2016-06-17 MED ORDER — SODIUM CHLORIDE 0.9 % IV BOLUS (SEPSIS)
1000.0000 mL | Freq: Once | INTRAVENOUS | Status: AC
  Administered 2016-06-17: 1000 mL via INTRAVENOUS

## 2016-06-17 MED ORDER — DIPHENOXYLATE-ATROPINE 2.5-0.025 MG PO TABS
2.0000 | ORAL_TABLET | Freq: Once | ORAL | Status: AC
  Administered 2016-06-17: 2 via ORAL
  Filled 2016-06-17: qty 2

## 2016-06-17 NOTE — Discharge Instructions (Signed)
Diarrhea Diarrhea is watery poop (stool). It can make you feel weak, tired, thirsty, or give you a dry mouth (signs of dehydration). Watery poop is a sign of another problem, most often an infection. It often lasts 2-3 days. It can last longer if it is a sign of something serious. Take care of yourself as told by your doctor. HOME CARE   Drink 1 cup (8 ounces) of fluid each time you have watery poop.  Do not drink the following fluids:  Those that contain simple sugars (fructose, glucose, galactose, lactose, sucrose, maltose).  Sports drinks.  Fruit juices.  Whole milk products.  Sodas.  Drinks with caffeine (coffee, tea, soda) or alcohol.  Oral rehydration solution may be used if the doctor says it is okay. You may make your own solution. Follow this recipe:   - teaspoon table salt.   teaspoon baking soda.   teaspoon salt substitute containing potassium chloride.  1 tablespoons sugar.  1 liter (34 ounces) of water.  Avoid the following foods:  High fiber foods, such as raw fruits and vegetables.  Nuts, seeds, and whole grain breads and cereals.   Those that are sweetened with sugar alcohols (xylitol, sorbitol, mannitol).  Try eating the following foods:  Starchy foods, such as rice, toast, pasta, low-sugar cereal, oatmeal, baked potatoes, crackers, and bagels.  Bananas.  Applesauce.  Eat probiotic-rich foods, such as yogurt and milk products that are fermented.  Wash your hands well after each time you have watery poop.  Only take medicine as told by your doctor.  Take a warm bath to help lessen burning or pain from having watery poop. GET HELP RIGHT AWAY IF:   You cannot drink fluids without throwing up (vomiting).  You keep throwing up.  You have blood in your poop, or your poop looks black and tarry.  You do not pee (urinate) in 6-8 hours, or there is only a small amount of very dark pee.  You have belly (abdominal) pain that gets worse or stays  in the same spot (localizes).  You are weak, dizzy, confused, or light-headed.  You have a very bad headache.  Your watery poop gets worse or does not get better.  You have a fever or lasting symptoms for more than 2-3 days.  You have a fever and your symptoms suddenly get worse. MAKE SURE YOU:   Understand these instructions.  Will watch your condition.  Will get help right away if you are not doing well or get worse.   This information is not intended to replace advice given to you by your health care provider. Make sure you discuss any questions you have with your health care provider.   Document Released: 05/10/2008 Document Revised: 12/13/2014 Document Reviewed: 07/30/2012 Elsevier Interactive Patient Education 2016 Elsevier Inc.  Nausea and Vomiting Nausea means you feel sick to your stomach. Throwing up (vomiting) is a reflex where stomach contents come out of your mouth. HOME CARE   Take medicine as told by your doctor.  Do not force yourself to eat. However, you do need to drink fluids.  If you feel like eating, eat a normal diet as told by your doctor.  Eat rice, wheat, potatoes, bread, lean meats, yogurt, fruits, and vegetables.  Avoid high-fat foods.  Drink enough fluids to keep your pee (urine) clear or pale yellow.  Ask your doctor how to replace body fluid losses (rehydrate). Signs of body fluid loss (dehydration) include:  Feeling very thirsty.  Dry lips  and mouth.  Feeling dizzy.  Dark pee.  Peeing less than normal.  Feeling confused.  Fast breathing or heart rate. GET HELP RIGHT AWAY IF:   You have blood in your throw up.  You have black or bloody poop (stool).  You have a bad headache or stiff neck.  You feel confused.  You have bad belly (abdominal) pain.  You have chest pain or trouble breathing.  You do not pee at least once every 8 hours.  You have cold, clammy skin.  You keep throwing up after 24 to 48 hours.  You  have a fever. MAKE SURE YOU:   Understand these instructions.  Will watch your condition.  Will get help right away if you are not doing well or get worse.   This information is not intended to replace advice given to you by your health care provider. Make sure you discuss any questions you have with your health care provider.   Document Released: 05/10/2008 Document Revised: 02/14/2012 Document Reviewed: 04/23/2011 Elsevier Interactive Patient Education 2016 [employer].  Hypokalemia Hypokalemia means that the amount of potassium in the blood is lower than normal.Potassium is a chemical, called an electrolyte, that helps regulate the amount of fluid in the body. It also stimulates muscle contraction and helps nerves function properly.Most of the body's potassium is inside of cells, and only a very small amount is in the blood. Because the amount in the blood is so small, minor changes can be life-threatening. CAUSES  Antibiotics.  Diarrhea or vomiting.  Using laxatives too much, which can cause diarrhea.  Chronic kidney disease.  Water pills (diuretics).  Eating disorders (bulimia).  Low magnesium level.  Sweating a lot. SIGNS AND SYMPTOMS  Weakness.  Constipation.  Fatigue.  Muscle cramps.  Mental confusion.  Skipped heartbeats or irregular heartbeat (palpitations).  Tingling or numbness. DIAGNOSIS  Your health care provider can diagnose hypokalemia with blood tests. In addition to checking your potassium level, your health care provider may also check other lab tests. TREATMENT Hypokalemia can be treated with potassium supplements taken by mouth or adjustments in your current medicines. If your potassium level is very low, you may need to get potassium through a vein (IV) and be monitored in the hospital. A diet high in potassium is also helpful. Foods high in potassium are:  Nuts, such as peanuts and pistachios.  Seeds, such as sunflower seeds and  pumpkin seeds.  Peas, lentils, and lima beans.  Whole grain and bran cereals and breads.  Fresh fruit and vegetables, such as apricots, avocado, bananas, cantaloupe, kiwi, oranges, tomatoes, asparagus, and potatoes.  Orange and tomato juices.  Red meats.  Fruit yogurt. HOME CARE INSTRUCTIONS  Take all medicines as prescribed by your health care provider.  Maintain a healthy diet by including nutritious food, such as fruits, vegetables, nuts, whole grains, and lean meats.  If you are taking a laxative, be sure to follow the directions on the label. SEEK MEDICAL CARE IF:  Your weakness gets worse.  You feel your heart pounding or racing.  You are vomiting or having diarrhea.  You are diabetic and having trouble keeping your blood glucose in the normal range. SEEK IMMEDIATE MEDICAL CARE IF:  You have chest pain, shortness of breath, or dizziness.  You are vomiting or having diarrhea for more than 2 days.  You faint. MAKE SURE YOU:   Understand these instructions.  Will watch your condition.  Will get help right away if you are not  doing well or get worse.   This information is not intended to replace advice given to you by your health care provider. Make sure you discuss any questions you have with your health care provider.   Document Released: 11/22/2005 Document Revised: 12/13/2014 Document Reviewed: 05/25/2013 Elsevier Interactive Patient Education 2016 [place] the medicines prescribed if you continue to have vomiting and diarrhea.  Continue taking your potassium tablets twice daily.  Make sure you are drinking plenty of fluids.  We suggest evaluation by a urologist to help determine the reason for the blood in your urine and have been given a referral for this.

## 2016-06-17 NOTE — ED Provider Notes (Signed)
CSN: [id number]     Arrival date & time 06/17/16  0804 History   First MD Initiated Contact with Patient 06/17/16 [phone]     Chief Complaint  Patient presents with  . Emesis     (Consider location/radiation/quality/duration/timing/severity/associated sxs/prior Treatment) The history is provided by the patient.     [name] is a 55 y.o. female with past medical history significant for DM, HTN, COPD/asthma and diastolic dysfunction presenting with vomiting, diarrhea and low blood glucose levels since starting Trulicity.  Her first dose of this weekly injection was taken 5 days ago, with her symptoms starting the next day.  She states she has been unable to keep anything down by mouth, and reports nearly constant watery nonbloody diarrhea along with waxing and waning abdominal cramping.  She feels fatigued, sometimes lightheaded with palpitations with walking.  She denies chest pain, states she does have shortness of breath but not worsened from her chronic COPD.  Her blood sugars have been in the 90-120 range this week  which alarms her because she is normally in 300-400 range.  She has not used her NovoLog since taking her first dose of Trulicity.  She has taken Imodium without relief of diarrhea, also added a dose of Kaopectate this morning.  She has contacted her endocrinologist who is changing her DM medicines, advised her to not take any more Trulicity.     Past Medical History  Diagnosis Date  . Diabetes mellitus   . Hypertension   . Asthma   . Depression   . COPD (chronic obstructive pulmonary disease) ([place])   . Prolonged QT interval 05/14/2015  . Diastolic dysfunction Q000111Q   Past Surgical History  Procedure Laterality Date  . Cesarean section    . Cholecystectomy    . Cataract extraction     Family History  Problem Relation Age of Onset  . Stroke Mother   . Diabetes Father   . Heart failure Father   . Hypertension Father   . Diabetes Sister   . Heart failure  Sister   . Hypertension Sister   . Cancer Other    Social History  Substance Use Topics  . Smoking status: Passive Smoke Exposure - Never Smoker  . Smokeless tobacco: Never Used  . Alcohol Use: No   OB History    Gravida Para Term Preterm AB TAB SAB Ectopic Multiple Living   3 2 2  1  1   2      Review of Systems  Constitutional: Negative for fever.  HENT: Negative for congestion and sore throat.   Eyes: Negative.   Respiratory: Positive for shortness of breath. Negative for chest tightness.   Cardiovascular: Positive for palpitations. Negative for chest pain.  Gastrointestinal: Positive for nausea, vomiting, abdominal pain and diarrhea.  Genitourinary: Negative.   Musculoskeletal: Negative for joint swelling, arthralgias and neck pain.  Skin: Negative.  Negative for rash and wound.  Neurological: Positive for weakness. Negative for dizziness, light-headedness, numbness and headaches.  Psychiatric/Behavioral: Negative.       Allergies  Benicar; Codeine; and Sulfa antibiotics  Home Medications   Prior to Admission medications   Medication Sig Start Date End Date Taking? Authorizing Provider  albuterol (PROAIR HFA) 108 (90 BASE) MCG/ACT inhaler Inhale 2 puffs into the lungs every 6 (six) hours as needed for wheezing or shortness of breath.   Yes Historical Provider, MD  amLODipine (NORVASC) 5 MG tablet Take 5 mg by mouth daily.  12/31/15  Yes Historical Provider,  MD  atorvastatin (LIPITOR) 20 MG tablet Take 20 mg by mouth daily. 03/27/16  Yes Historical Provider, MD  escitalopram (LEXAPRO) 20 MG tablet Take 20 mg by mouth every evening.   Yes Historical Provider, MD  esomeprazole (NEXIUM) 20 MG capsule Take 20 mg by mouth daily at 12 noon.   Yes Historical Provider, MD  Fluticasone-Salmeterol (ADVAIR) 250-50 MCG/DOSE AEPB Inhale 1 puff into the lungs daily.   Yes Historical Provider, MD  insulin aspart (NOVOLOG FLEXPEN) 100 UNIT/ML FlexPen Inject 60 Units into the skin 3  (three) times daily with meals.   Yes Historical Provider, MD  levothyroxine (LEVOTHROID) 25 MCG tablet Take 25 mcg by mouth daily before breakfast.  03/03/16 03/03/17 Yes Historical Provider, MD  LORazepam (ATIVAN) 1 MG tablet Take 1 mg by mouth at bedtime.  10/08/15  Yes Historical Provider, MD  potassium chloride SA (K-DUR,KLOR-CON) 20 MEQ tablet Take 1 tablet (20 mEq total) by mouth 2 (two) times daily. 04/01/16  Yes Merryl Hacker, MD  propranolol ER (INDERAL LA) 120 MG 24 hr capsule Take 120 mg by mouth at bedtime.    Yes Historical Provider, MD  spironolactone (ALDACTONE) 25 MG tablet Take 1 tablet (25 mg total) by mouth daily. 05/16/15  Yes Samuella Cota, MD  torsemide (DEMADEX) 20 MG tablet Take 1 tablet (20 mg total) by mouth 2 (two) times daily. 05/16/15  Yes Samuella Cota, MD  TRESIBA FLEXTOUCH 200 UNIT/ML SOPN Inject 160 Units into the skin at bedtime. 03/27/16  Yes Historical Provider, MD  urea (CARMOL) 40 % CREA Apply 1 application topically daily as needed. Applied to abdomen, ankles,  and feet as needed 02/24/16  Yes Historical Provider, MD  diphenoxylate-atropine (LOMOTIL) 2.5-0.025 MG tablet Take 1 tablet by mouth 4 (four) times daily as needed for diarrhea or loose stools. 06/17/16   Evalee Jefferson, PA-C  prochlorperazine (COMPAZINE) 10 MG tablet Take 1 tablet (10 mg total) by mouth 2 (two) times daily as needed for nausea or vomiting. 06/17/16   Evalee Jefferson, PA-C   BP 101/46 mmHg  Pulse 76  Temp(Src) 97.7 F (36.5 C) (Oral)  Resp 17  Ht 5\' 2"  (1.575 m)  Wt 136.079 kg  BMI 54.86 kg/m2  SpO2 96%  LMP 08/06/2012 Physical Exam  Constitutional: She appears well-developed and well-nourished.  HENT:  Head: Normocephalic and atraumatic.  Mouth/Throat: Mucous membranes are dry.  Eyes: Conjunctivae are normal.  Neck: Normal range of motion.  Cardiovascular: Normal rate, regular rhythm, normal heart sounds and intact distal pulses.   Pulmonary/Chest: Effort normal and breath  sounds normal. She has no wheezes.  Abdominal: Soft. Bowel sounds are normal. There is tenderness. There is no rebound and no guarding.  Diffuse mild ttp, no focal findings, no guard, rebound or other exam findings suggesting acute abdomen.  Obese.  Musculoskeletal: Normal range of motion.  Neurological: She is alert.  Skin: Skin is warm and dry.  Psychiatric: She has a normal mood and affect.  Nursing note and vitals reviewed.   ED Course  Procedures (including critical care time) Labs Review Labs Reviewed  CBC WITH DIFFERENTIAL/PLATELET - Abnormal; Notable for the following:    WBC 11.8 (*)    Hemoglobin 15.8 (*)    Neutro Abs 8.7 (*)    All other components within normal limits  COMPREHENSIVE METABOLIC PANEL - Abnormal; Notable for the following:    Potassium 3.1 (*)    Chloride 93 (*)    CO2 34 (*)  Glucose, Bld 196 (*)    Creatinine, Ser 1.02 (*)    Calcium 8.4 (*)    Albumin 3.1 (*)    Alkaline Phosphatase 149 (*)    All other components within normal limits  URINALYSIS, ROUTINE W REFLEX MICROSCOPIC (NOT AT [name]) - Abnormal; Notable for the following:    Hgb urine dipstick LARGE (*)    Protein, ur TRACE (*)    All other components within normal limits  URINE MICROSCOPIC-ADD ON - Abnormal; Notable for the following:    Squamous Epithelial / LPF TOO NUMEROUS TO COUNT (*)    Bacteria, UA FEW (*)    All other components within normal limits  CBG MONITORING, ED - Abnormal; Notable for the following:    Glucose-Capillary 232 (*)    All other components within normal limits  LIPASE, BLOOD  TROPONIN I    Imaging Review Dg Chest 2 View  06/17/2016  CLINICAL DATA:  Palpitations EXAM: CHEST  2 VIEW COMPARISON:  03/31/2016 FINDINGS: Cardiomediastinal silhouette is stable. No acute infiltrate or pleural effusion. No pulmonary edema. Bony thorax is unremarkable. IMPRESSION: No active cardiopulmonary disease. Electronically Signed   By: Lahoma Crocker M.D.   On: 06/17/2016 09:22    I have personally reviewed and evaluated these images and lab results as part of my medical decision-making.   EKG Interpretation None        ED ECG REPORT   Date: 06/17/2016  Rate: 69  Rhythm: normal sinus rhythm  QRS Axis: left  Intervals: QT prolonged  ST/T Wave abnormalities: nonspecific ST changes  Conduction Disutrbances:none  Narrative Interpretation:   Old EKG Reviewed: unchanged  I have personally reviewed the EKG tracing and agree with the computerized printout as noted.   MDM   Final diagnoses:  Vomiting and diarrhea  Hypokalemia  Hematuria, microscopic    Patients labs reviewed.  Radiological studies were viewed, interpreted and considered during the medical decision making and disposition process. I agree with radiologists reading.  Results were also discussed with patient. Patient was given IV fluids while here, orthostatics obtained after fluid bolus and she felt improved without lightheadedness.  She was given Compazine and Lomotil and has had no vomiting or diarrhea while in the department.  She is hypokalemic, she was given 40 mEq dose here, she has this medication for twice a day use at home but has been unable to take with her vomiting this week.  She was encouraged to start taking this again twice daily.  She was prescribed further Compazine and Lomotil for when necessary home use.  Discussed hematuria, denies symptoms of urinary origin.  She was given referral to urology for further evaluation of this problem.  The patient appears reasonably screened and/or stabilized for discharge and I doubt any other medical condition or other [facility] requiring further screening, evaluation, or treatment in the ED at this time prior to discharge.      Evalee Jefferson, PA-C 06/17/16 1030  Isla Pence, MD 06/17/16 1047

## 2016-06-17 NOTE — ED Notes (Signed)
Vomiting and diarrhea since starting trulicity.  States her CBG's have been running low too.

## 2016-06-17 NOTE — ED Notes (Signed)
Back from xray

## 2016-06-17 NOTE — ED Notes (Signed)
POC cbg 232.

## 2016-09-07 ENCOUNTER — Encounter (HOSPITAL_COMMUNITY): Payer: Self-pay | Admitting: Emergency Medicine

## 2016-09-07 ENCOUNTER — Emergency Department (HOSPITAL_COMMUNITY)
Admission: EM | Admit: 2016-09-07 | Discharge: 2016-09-07 | Disposition: A | Discharge status: Home / Self Care | Payer: PRIVATE HEALTH INSURANCE | Attending: Emergency Medicine | Admitting: Emergency Medicine

## 2016-09-07 ENCOUNTER — Emergency Department (HOSPITAL_COMMUNITY): Payer: PRIVATE HEALTH INSURANCE

## 2016-09-07 DIAGNOSIS — E119 Type 2 diabetes mellitus without complications: Secondary | ICD-10-CM | POA: Insufficient documentation

## 2016-09-07 DIAGNOSIS — J45909 Unspecified asthma, uncomplicated: Secondary | ICD-10-CM | POA: Insufficient documentation

## 2016-09-07 DIAGNOSIS — Z792 Long term (current) use of antibiotics: Secondary | ICD-10-CM | POA: Insufficient documentation

## 2016-09-07 DIAGNOSIS — J449 Chronic obstructive pulmonary disease, unspecified: Secondary | ICD-10-CM | POA: Diagnosis not present

## 2016-09-07 DIAGNOSIS — Z7722 Contact with and (suspected) exposure to environmental tobacco smoke (acute) (chronic): Secondary | ICD-10-CM | POA: Insufficient documentation

## 2016-09-07 DIAGNOSIS — E876 Hypokalemia: Secondary | ICD-10-CM | POA: Diagnosis not present

## 2016-09-07 DIAGNOSIS — E860 Dehydration: Secondary | ICD-10-CM | POA: Diagnosis not present

## 2016-09-07 DIAGNOSIS — I10 Essential (primary) hypertension: Secondary | ICD-10-CM | POA: Diagnosis not present

## 2016-09-07 DIAGNOSIS — Z794 Long term (current) use of insulin: Secondary | ICD-10-CM | POA: Insufficient documentation

## 2016-09-07 DIAGNOSIS — R531 Weakness: Secondary | ICD-10-CM | POA: Diagnosis present

## 2016-09-07 DIAGNOSIS — Z79899 Other long term (current) drug therapy: Secondary | ICD-10-CM | POA: Insufficient documentation

## 2016-09-07 LAB — CBC
HCT: 45.2 % (ref 36.0–46.0)
Hemoglobin: 15.9 g/dL — ABNORMAL HIGH (ref 12.0–15.0)
MCH: 31.7 pg (ref 26.0–34.0)
MCHC: 35.2 g/dL (ref 30.0–36.0)
MCV: 90 fL (ref 78.0–100.0)
Platelets: 289 10*3/uL (ref 150–400)
RBC: 5.02 MIL/uL (ref 3.87–5.11)
RDW: 14.3 % (ref 11.5–15.5)
WBC: 11 10*3/uL — ABNORMAL HIGH (ref 4.0–10.5)

## 2016-09-07 LAB — URINE MICROSCOPIC-ADD ON

## 2016-09-07 LAB — URINALYSIS, ROUTINE W REFLEX MICROSCOPIC
Bilirubin Urine: NEGATIVE
Glucose, UA: 500 mg/dL — AB
Ketones, ur: NEGATIVE mg/dL
Leukocytes, UA: NEGATIVE
Nitrite: NEGATIVE
Protein, ur: 30 mg/dL — AB
Specific Gravity, Urine: 1.015 (ref 1.005–1.030)
pH: 6 (ref 5.0–8.0)

## 2016-09-07 LAB — BASIC METABOLIC PANEL
Anion gap: 10 (ref 5–15)
BUN: 14 mg/dL (ref 6–20)
CO2: 33 mmol/L — ABNORMAL HIGH (ref 22–32)
Calcium: 10.3 mg/dL (ref 8.9–10.3)
Chloride: 92 mmol/L — ABNORMAL LOW (ref 101–111)
Creatinine, Ser: 1.05 mg/dL — ABNORMAL HIGH (ref 0.44–1.00)
GFR calc Af Amer: >60 mL/min (ref >60)
GFR calc non Af Amer: 59 mL/min — ABNORMAL LOW (ref >60)
Glucose, Bld: 288 mg/dL — ABNORMAL HIGH (ref 65–99)
Potassium: 3.2 mmol/L — ABNORMAL LOW (ref 3.5–5.1)
Sodium: 135 mmol/L (ref 135–145)

## 2016-09-07 LAB — CBG MONITORING, ED: Glucose-Capillary: 183 mg/dL — ABNORMAL HIGH (ref 65–99)

## 2016-09-07 LAB — TROPONIN I: Troponin I: <0.03 ng/mL (ref <0.03)

## 2016-09-07 IMAGING — DX DG CHEST 2V
2 series · 2 of 2 positions shown · non-contrast
Comparison: Chest radiograph dated [DATE]

CLINICAL DATA: 55-year-old female with weakness.

EXAM:
CHEST  2 VIEW

[chest pa]
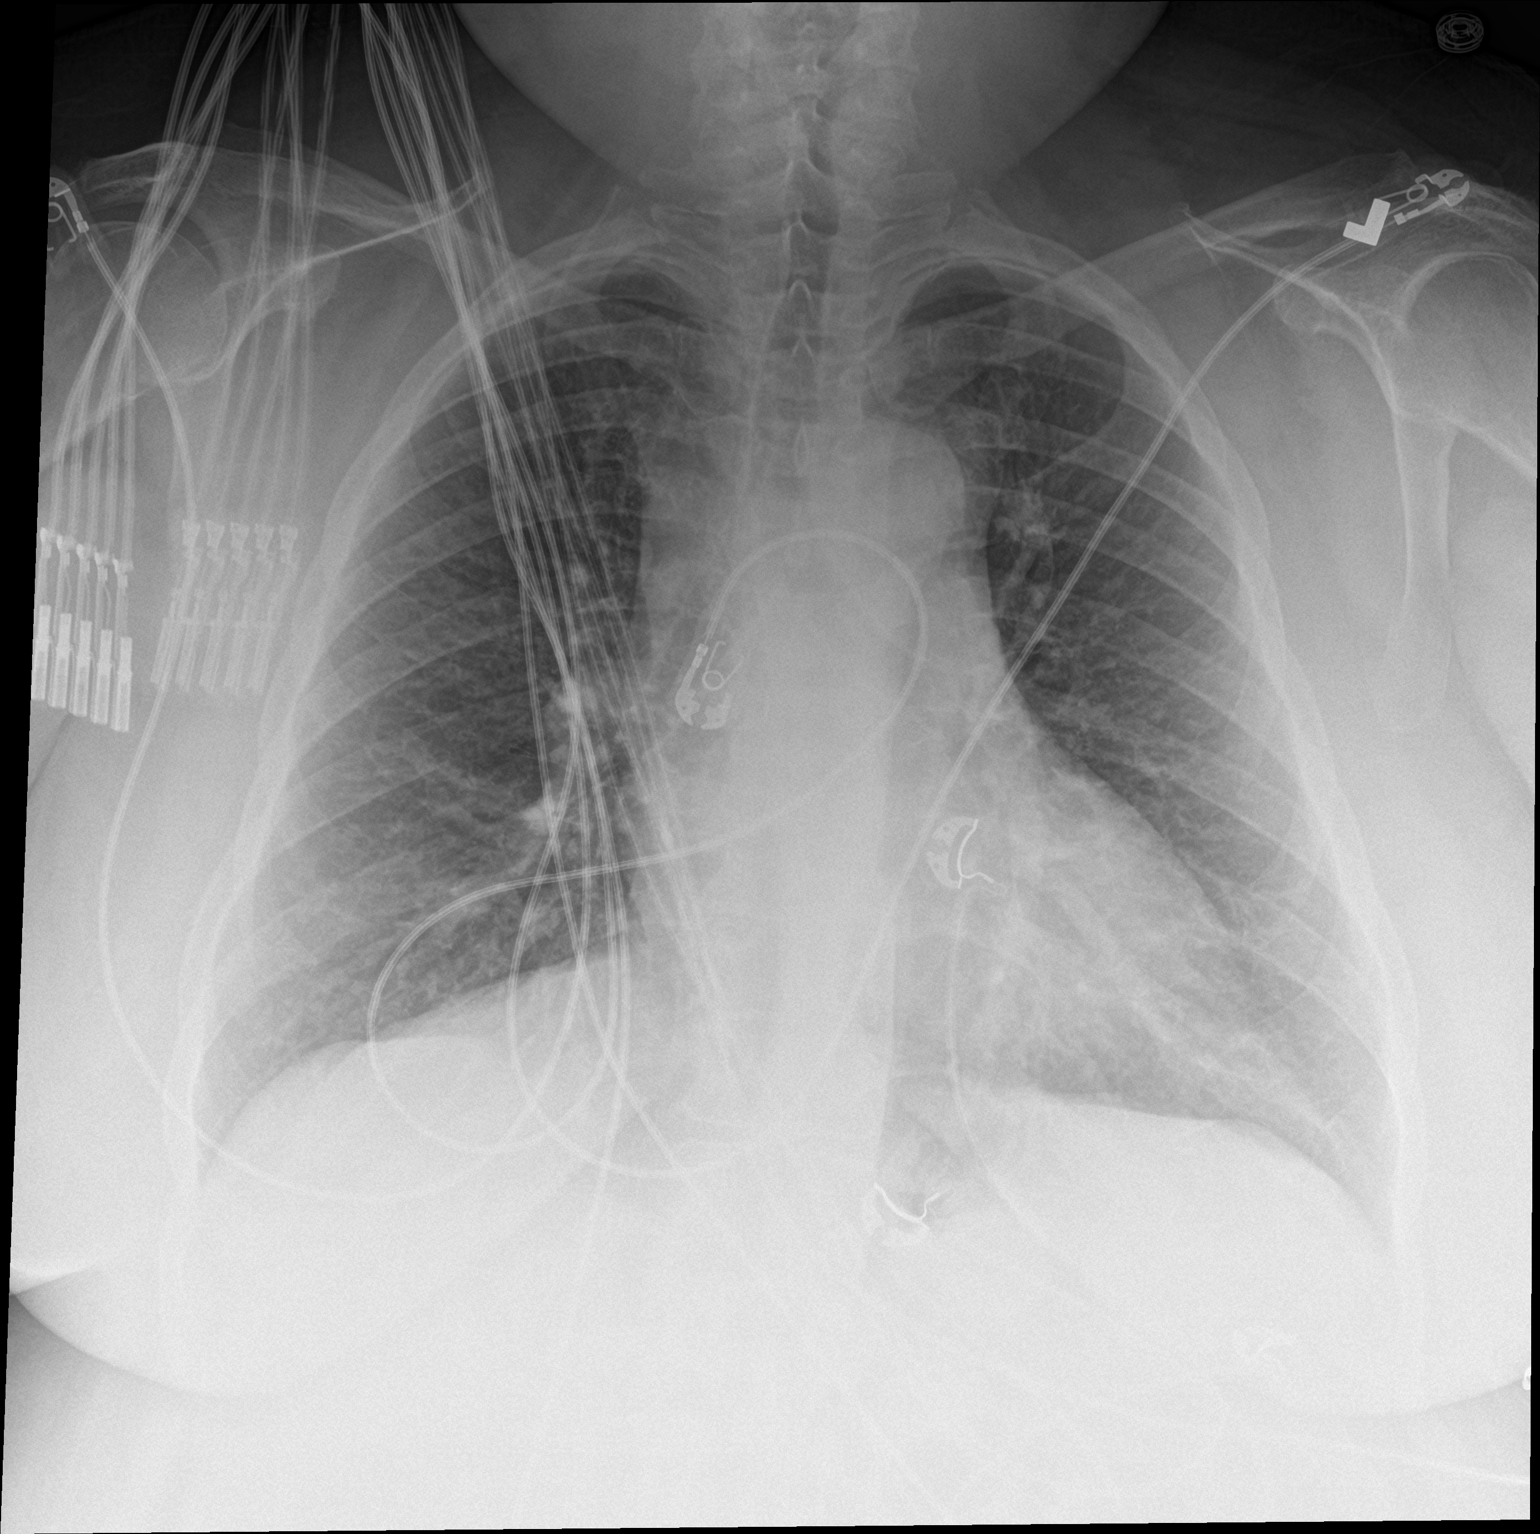

[chest lat]
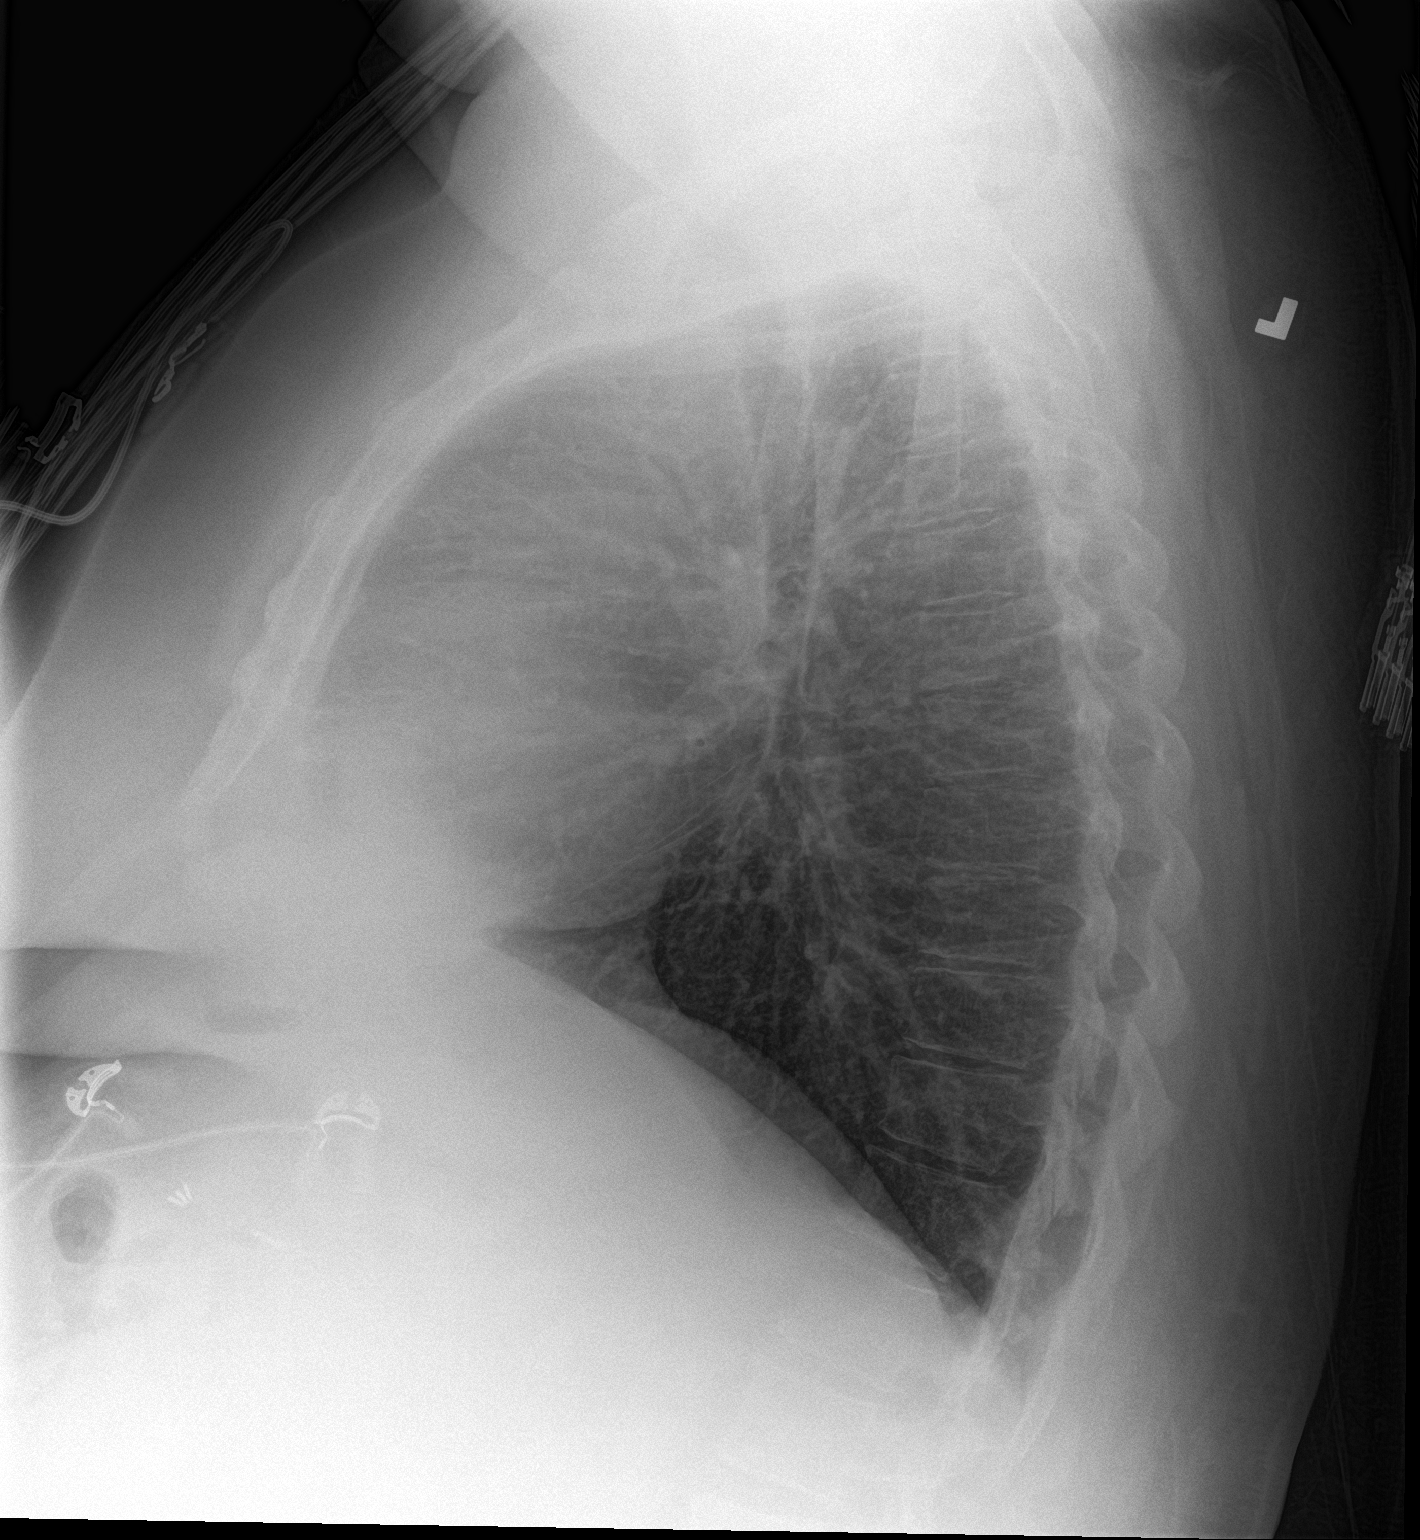

[2 of 2 positions shown; findings below may reference images not displayed]

FINDINGS: The heart size and mediastinal contours are within normal limits.
Both lungs are clear. The visualized skeletal structures are
unremarkable.
IMPRESSION: No active cardiopulmonary disease.

## 2016-09-07 MED ORDER — SODIUM CHLORIDE 0.9 % IV SOLN
Freq: Once | INTRAVENOUS | Status: AC
  Administered 2016-09-07: 18:00:00 via INTRAVENOUS

## 2016-09-07 MED ORDER — POTASSIUM CHLORIDE 10 MEQ/100ML IV SOLN
10.0000 meq | Freq: Once | INTRAVENOUS | Status: AC
  Administered 2016-09-07: 10 meq via INTRAVENOUS
  Filled 2016-09-07: qty 100

## 2016-09-07 MED ORDER — MAGNESIUM SULFATE 2 GM/50ML IV SOLN
2.0000 g | INTRAVENOUS | Status: AC
  Administered 2016-09-07: 2 g via INTRAVENOUS
  Filled 2016-09-07: qty 50

## 2016-09-07 NOTE — ED Provider Notes (Signed)
[name]   CSN: [id number] Arrival date & time: 09/07/16  1641     History   Chief Complaint Chief Complaint  Patient presents with  . Weakness    HPI [name] is a 55 y.o. female.  The pt is a 55 y/o female - she has hx of DM, prolonged QT ans COPD, she has had n/v that started yesterday, it was severe, she vomitted several times had had very little to eat or drink - today she has an orange - she has had a diffuse generalized weakness and DOE when she walks which is new - she denies CP or cough or fever and has chronic swelling in her legs.  Sx are constant, nothing makes better, worse with ambulation - mild assocaited urinary sx  With  some burning with urination.    Weakness     Past Medical History:  Diagnosis Date  . Asthma   . COPD (chronic obstructive pulmonary disease) ([place])   . Depression   . Diabetes mellitus   . Diastolic dysfunction Q000111Q  . Hypertension   . Prolonged QT interval 05/14/2015    Patient Active Problem List   Diagnosis Date Noted  . Diastolic dysfunction Q000111Q  . Prolonged QT interval 05/14/2015  . Palpitations 07/23/2014  . Generalized weakness 07/23/2014  . Hypokalemia 07/22/2014  . Diabetes mellitus ([place]) 07/22/2014  . Hypertension 07/22/2014  . COPD (chronic obstructive pulmonary disease) ([place]) 07/22/2014  . Depression 07/22/2014    Past Surgical History:  Procedure Laterality Date  . CATARACT EXTRACTION    . CESAREAN SECTION    . CHOLECYSTECTOMY      OB History    Gravida Para Term Preterm AB Living   3 2 2   1 2    SAB TAB Ectopic Multiple Live Births   1               Home Medications    Prior to Admission medications   Medication Sig Start Date End Date Taking? Authorizing Provider  albuterol (PROAIR HFA) 108 (90 BASE) MCG/ACT inhaler Inhale 2 puffs into the lungs every 6 (six) hours as needed for wheezing or shortness of breath.   Yes Historical Provider, MD  amLODipine  (NORVASC) 5 MG tablet Take 5 mg by mouth daily.  12/31/15  Yes Historical Provider, MD  atorvastatin (LIPITOR) 20 MG tablet Take 20 mg by mouth daily. 03/27/16  Yes Historical Provider, MD  ciprofloxacin (CIPRO) 250 MG tablet Take 250 mg by mouth 2 (two) times daily. 10 day course starting on 08/30/2016   Yes Historical Provider, MD  esomeprazole (NEXIUM) 20 MG capsule Take 20 mg by mouth daily at 12 noon.   Yes Historical Provider, MD  Fluticasone-Salmeterol (ADVAIR) 250-50 MCG/DOSE AEPB Inhale 1 puff into the lungs daily.   Yes Historical Provider, MD  gabapentin (NEURONTIN) 600 MG tablet Take 600-1,200 mg by mouth 3 (three) times daily. One twice daily and two at bedtime   Yes Historical Provider, MD  insulin aspart (NOVOLOG FLEXPEN) 100 UNIT/ML FlexPen Inject 60 Units into the skin 3 (three) times daily with meals.   Yes Historical Provider, MD  levothyroxine (LEVOTHROID) 25 MCG tablet Take 25 mcg by mouth daily before breakfast.  03/03/16 03/03/17 Yes Historical Provider, MD  LORazepam (ATIVAN) 1 MG tablet Take 1 mg by mouth at bedtime as needed and may repeat dose one time if needed for anxiety or sleep.  10/08/15  Yes Historical Provider, MD  potassium chloride SA (  K-DUR,KLOR-CON) 20 MEQ tablet Take 1 tablet (20 mEq total) by mouth 2 (two) times daily. Patient taking differently: Take 40 mEq by mouth 2 (two) times daily.  04/01/16  Yes Merryl Hacker, MD  propranolol ER (INDERAL LA) 120 MG 24 hr capsule Take 120 mg by mouth at bedtime.    Yes Historical Provider, MD  spironolactone (ALDACTONE) 25 MG tablet Take 1 tablet (25 mg total) by mouth daily. 05/16/15  Yes Samuella Cota, MD  torsemide (DEMADEX) 20 MG tablet Take 1 tablet (20 mg total) by mouth 2 (two) times daily. 05/16/15  Yes Samuella Cota, MD  TRESIBA FLEXTOUCH 200 UNIT/ML SOPN Inject 160 Units into the skin at bedtime. 03/27/16  Yes Historical Provider, MD    Family History Family History  Problem Relation Age of Onset  .  Stroke Mother   . Diabetes Father   . Heart failure Father   . Hypertension Father   . Diabetes Sister   . Heart failure Sister   . Hypertension Sister   . Cancer Other     Social History Social History  Substance Use Topics  . Smoking status: Passive Smoke Exposure - Never Smoker  . Smokeless tobacco: Never Used  . Alcohol use No     Allergies   Benicar [olmesartan]; Codeine; and Sulfa antibiotics   Review of Systems Review of Systems  Neurological: Positive for weakness.  All other systems reviewed and are negative.    Physical Exam Updated Vital Signs BP 156/73 (BP Location: Right Arm)   Pulse 79   Temp 98.3 F (36.8 C) (Oral)   Resp 18   Ht 5\' 2"  (1.575 m)   Wt (!) 303 lb (137.4 kg)   LMP 08/06/2012   SpO2 94%   BMI 55.42 kg/m   Physical Exam  Constitutional: She appears well-developed and well-nourished. No distress.  HENT:  Head: Normocephalic and atraumatic.  Mouth/Throat: Oropharynx is clear and moist. No oropharyngeal exudate.  Eyes: Conjunctivae and EOM are normal. Pupils are equal, round, and reactive to light. Right eye exhibits no discharge. Left eye exhibits no discharge. No scleral icterus.  Neck: Normal range of motion. Neck supple. No JVD present. No thyromegaly present.  Cardiovascular: Normal rate, regular rhythm, normal heart sounds and intact distal pulses.  Exam reveals no gallop and no friction rub.   No murmur heard. Pulmonary/Chest: Effort normal and breath sounds normal. No respiratory distress. She has no wheezes. She has no rales.  Abdominal: Soft. Bowel sounds are normal. She exhibits no distension and no mass. There is no tenderness.  Musculoskeletal: Normal range of motion. She exhibits no edema ( symmertical mild swelling and edema of the LE's at the tibia) or tenderness.  Lymphadenopathy:    She has no cervical adenopathy.  Neurological: She is alert. Coordination normal.  Neurologic exam:  Speech clear, pupils equal round  reactive to light, extraocular movements intact  Normal peripheral visual fields Cranial nerves III through XII normal including no facial droop Follows commands, moves all extremities x4, normal strength to bilateral upper and lower extremities at all major muscle groups including grip Sensation normal to light touch and pinprick Coordination intact, no limb ataxia, finger-nose-finger normal Rapid alternating movements normal No pronator drift Gait normal   Skin: Skin is warm and dry. No rash noted. No erythema.  Psychiatric: She has a normal mood and affect. Her behavior is normal.  Nursing [name] and vitals reviewed.    ED Treatments / Results  Labs (all  labs ordered are listed, but only abnormal results are displayed) Labs Reviewed  BASIC METABOLIC PANEL - Abnormal; Notable for the following:       Result Value   Potassium 3.2 (*)    Chloride 92 (*)    CO2 33 (*)    Glucose, Bld 288 (*)    Creatinine, Ser 1.05 (*)    GFR calc non Af Amer 59 (*)    All other components within normal limits  CBC - Abnormal; Notable for the following:    WBC 11.0 (*)    Hemoglobin 15.9 (*)    All other components within normal limits  URINALYSIS, ROUTINE W REFLEX MICROSCOPIC (NOT AT [facility]) - Abnormal; Notable for the following:    Glucose, UA 500 (*)    Hgb urine dipstick TRACE (*)    Protein, ur 30 (*)    All other components within normal limits  URINE MICROSCOPIC-ADD ON - Abnormal; Notable for the following:    Squamous Epithelial / LPF 0-5 (*)    Bacteria, UA RARE (*)    All other components within normal limits  CBG MONITORING, ED - Abnormal; Notable for the following:    Glucose-Capillary 183 (*)    All other components within normal limits  TROPONIN I    EKG  EKG Interpretation  Date/Time:  Tuesday September 07 2016 16:57:44 EDT Ventricular Rate:  91 PR Interval:  124 QRS Duration: 90 QT Interval:  476 QTC Calculation: 585 R Axis:   -39 Text Interpretation:   Critical Test  Result: Long QTc Normal sinus rhythm Possible Left atrial enlargement Left axis deviation Pulmonary disease pattern Left ventricular hypertrophy Nonspecific T wave abnormality Abnormal ECG Since last tracing QT prolonged Confirmed by Sabra Heck  MD, Marjarie Irion ([id number]) on 09/07/2016 5:45:04 PM       EKG Interpretation  Date/Time:  Tuesday September 07 2016 18:08:40 EDT Ventricular Rate:  82 PR Interval:  124 QRS Duration: 98 QT Interval:  421 QTC Calculation: 492 R Axis:   -30 Text Interpretation:  Sinus rhythm Left axis deviation Low voltage, precordial leads Borderline repolarization abnormality Borderline prolonged QT interval Baseline wander in lead(s) V2 V3 Since last tracing QT has shortened Confirmed by Sabra Heck  MD, Herby Amick ([id number]) on 09/07/2016 8:12:23 PM        Radiology Dg Chest 2 View  Result Date: 09/07/2016 CLINICAL DATA:  55 year old female with weakness. EXAM: CHEST  2 VIEW COMPARISON:  Chest radiograph dated 06/17/2016 FINDINGS: The heart size and mediastinal contours are within normal limits. Both lungs are clear. The visualized skeletal structures are unremarkable. IMPRESSION: No active cardiopulmonary disease. Electronically Signed   By: Anner Crete M.D.   On: 09/07/2016 20:04    Procedures Procedures (including critical care time)  Medications Ordered in ED Medications  0.9 %  sodium chloride infusion ( Intravenous Stopped 09/07/16 2058)  potassium chloride 10 mEq in 100 mL IVPB (0 mEq Intravenous Stopped 09/07/16 2058)  magnesium sulfate IVPB 2 g 50 mL (0 g Intravenous Stopped 09/07/16 2058)     Initial Impression / Assessment and Plan / ED Course  I have reviewed the triage vital signs and the nursing notes.  Pertinent labs & imaging results that were available during my care of the patient were reviewed by me and considered in my medical decision making (see chart for details).  Clinical Course   She does appear diffusely weak but is able to comply with exam without  difficulty -= she has prolonged QT compared to normal  but K is 3.2  Will replace, rehydrated and treat the hyperglycemia (no DKA), concern for DOE - needs trop and CXR.  She had then received fluids after having w/u showing mild hypokalemia - the repeat ECG showed much improved QTc, she states that she feels much much better after fluids - is amenable to d/c.  No signs of elevated trop or sig anemia.  basline labs at this time.  Pt agreeable to f/u closely.    Final Clinical Impressions(s) / ED Diagnoses   Final diagnoses:  Dehydration  Hypokalemia    New Prescriptions Discharge Medication List as of 09/07/2016 10:15 PM       Noemi Chapel, MD 09/07/16 2318

## 2016-09-07 NOTE — Discharge Instructions (Signed)
See your doctor in next 2 days for rechck of your potassium - drink plenty of fluids - your potassium has improved as we have replaced it with IV potassium.

## 2016-09-07 NOTE — ED Triage Notes (Signed)
Pt reports she had several days of vomiting and diarrhea from Saturday until yesterday.  Began having muscle cramps on Sunday and went to see pcp today due to hx of hypokalemia.  Was sent here for eval.

## 2016-09-27 DIAGNOSIS — E039 Hypothyroidism, unspecified: Secondary | ICD-10-CM | POA: Insufficient documentation

## 2017-03-21 ENCOUNTER — Emergency Department (HOSPITAL_COMMUNITY)
Admission: EM | Admit: 2017-03-21 | Discharge: 2017-03-21 | Disposition: A | Discharge status: Home / Self Care | Payer: PRIVATE HEALTH INSURANCE | Attending: Emergency Medicine | Admitting: Emergency Medicine

## 2017-03-21 ENCOUNTER — Encounter (HOSPITAL_COMMUNITY): Payer: Self-pay | Admitting: Emergency Medicine

## 2017-03-21 DIAGNOSIS — E119 Type 2 diabetes mellitus without complications: Secondary | ICD-10-CM | POA: Diagnosis not present

## 2017-03-21 DIAGNOSIS — J45909 Unspecified asthma, uncomplicated: Secondary | ICD-10-CM | POA: Diagnosis not present

## 2017-03-21 DIAGNOSIS — Z7722 Contact with and (suspected) exposure to environmental tobacco smoke (acute) (chronic): Secondary | ICD-10-CM | POA: Insufficient documentation

## 2017-03-21 DIAGNOSIS — J449 Chronic obstructive pulmonary disease, unspecified: Secondary | ICD-10-CM | POA: Insufficient documentation

## 2017-03-21 DIAGNOSIS — R531 Weakness: Secondary | ICD-10-CM | POA: Diagnosis present

## 2017-03-21 DIAGNOSIS — I10 Essential (primary) hypertension: Secondary | ICD-10-CM | POA: Diagnosis not present

## 2017-03-21 DIAGNOSIS — Z79899 Other long term (current) drug therapy: Secondary | ICD-10-CM | POA: Insufficient documentation

## 2017-03-21 DIAGNOSIS — E876 Hypokalemia: Secondary | ICD-10-CM

## 2017-03-21 DIAGNOSIS — R42 Dizziness and giddiness: Secondary | ICD-10-CM

## 2017-03-21 DIAGNOSIS — Z794 Long term (current) use of insulin: Secondary | ICD-10-CM | POA: Diagnosis not present

## 2017-03-21 LAB — MAGNESIUM: Magnesium: 1.7 mg/dL (ref 1.7–2.4)

## 2017-03-21 LAB — CBC
HCT: 46 % (ref 36.0–46.0)
Hemoglobin: 16.4 g/dL — ABNORMAL HIGH (ref 12.0–15.0)
MCH: 32.8 pg (ref 26.0–34.0)
MCHC: 35.7 g/dL (ref 30.0–36.0)
MCV: 92 fL (ref 78.0–100.0)
Platelets: 311 10*3/uL (ref 150–400)
RBC: 5 MIL/uL (ref 3.87–5.11)
RDW: 13.7 % (ref 11.5–15.5)
WBC: 11 10*3/uL — ABNORMAL HIGH (ref 4.0–10.5)

## 2017-03-21 LAB — BASIC METABOLIC PANEL
Anion gap: 12 (ref 5–15)
BUN: 18 mg/dL (ref 6–20)
CO2: 35 mmol/L — ABNORMAL HIGH (ref 22–32)
Calcium: 9.5 mg/dL (ref 8.9–10.3)
Chloride: 86 mmol/L — ABNORMAL LOW (ref 101–111)
Creatinine, Ser: 0.96 mg/dL (ref 0.44–1.00)
GFR calc Af Amer: >60 mL/min (ref >60)
GFR calc non Af Amer: >60 mL/min (ref >60)
Glucose, Bld: 340 mg/dL — ABNORMAL HIGH (ref 65–99)
Potassium: 2.6 mmol/L — CL (ref 3.5–5.1)
Sodium: 133 mmol/L — ABNORMAL LOW (ref 135–145)

## 2017-03-21 LAB — CBG MONITORING, ED
Glucose-Capillary: 383 mg/dL — ABNORMAL HIGH (ref 65–99)
Glucose-Capillary: 225 mg/dL — ABNORMAL HIGH (ref 65–99)

## 2017-03-21 MED ORDER — POTASSIUM CHLORIDE CRYS ER 20 MEQ PO TBCR
40.0000 meq | EXTENDED_RELEASE_TABLET | Freq: Once | ORAL | Status: AC
  Administered 2017-03-21: 40 meq via ORAL
  Filled 2017-03-21: qty 2

## 2017-03-21 MED ORDER — MECLIZINE HCL 12.5 MG PO TABS
25.0000 mg | ORAL_TABLET | Freq: Once | ORAL | Status: AC
  Administered 2017-03-21: 25 mg via ORAL
  Filled 2017-03-21: qty 2

## 2017-03-21 MED ORDER — POTASSIUM CHLORIDE 10 MEQ/100ML IV SOLN
10.0000 meq | INTRAVENOUS | Status: AC
  Administered 2017-03-21 (×2): 10 meq via INTRAVENOUS
  Filled 2017-03-21 (×2): qty 100

## 2017-03-21 MED ORDER — MECLIZINE HCL 12.5 MG PO TABS: 12.5000 mg | ORAL_TABLET | Freq: Three times a day (TID) | ORAL | 0 refills | Status: DC | PRN

## 2017-03-21 MED ORDER — DIAZEPAM 2 MG PO TABS
2.0000 mg | ORAL_TABLET | Freq: Once | ORAL | Status: AC
  Administered 2017-03-21: 2 mg via ORAL
  Filled 2017-03-21: qty 1

## 2017-03-21 MED ORDER — POTASSIUM CHLORIDE CRYS ER 20 MEQ PO TBCR: 40.0000 meq | EXTENDED_RELEASE_TABLET | Freq: Two times a day (BID) | ORAL | 0 refills | Status: DC

## 2017-03-21 NOTE — Discharge Instructions (Signed)
You were seen in the ED today with low potassium. We gave you potassium in the IV here in the ED and would like for you to take potassium supplements at home for the next 3 days. Call your PCP tomorrow to schedule follow up and repeat lab work.   Return to the ED immediately with any chest pain, difficulty breathing, fever, fainting, or feeling of a racing heart.

## 2017-03-21 NOTE — ED Notes (Addendum)
CRITICAL VALUE ALERT  Critical value received: K+ 2.6  Date of notification:  03-21-2017  Time of notification:  18:13  Critical value read back:Yes.    Nurse who received alert:  Liz Beach, RN   MD notified (1st page):  Long  Time of first page:  37  MD notified (2nd page):  Time of second page:  Responding MD:  Long  Time MD responded:  [phone]

## 2017-03-21 NOTE — ED Triage Notes (Signed)
Pt reports feeling like her potassium is low.  States she has been very fatigued, dizzy, and weak since last night.  Has had a little diarrhea for the past few days and is also c/o nausea.

## 2017-03-21 NOTE — ED Provider Notes (Signed)
Emergency Department Provider Note   I have reviewed the triage vital signs and the nursing notes.   HISTORY  Chief Complaint Weakness   HPI [name] is a 56 y.o. female with PMH of asthma, COPD, DM, HTN, and prolonged QTc resents to the emergency department for evaluation of worsening tingling in the legs, nausea, fatigue. Patient states she's had similar symptoms with low potassium in the past. She states that her legs swell frequently and she often takes combination of metoprolol and spironolactone for fluid-related symptoms. She states that she has supplemental potassium which she takes as needed when she begins to feel symptoms. He notes taking potassium supplementation over the past 3 days but cannot recall the dose. Today she had worsening symptoms at work and so presented to the emergency department. She denies any fever, chills, chest pain, palpitations, or near-syncope. No unilateral weakness or numbness. No radiation of symptoms.   Past Medical History:  Diagnosis Date  . Asthma   . COPD (chronic obstructive pulmonary disease) ([place])   . Depression   . Diabetes mellitus   . Diastolic dysfunction 12/11/1094  . Hypertension   . Prolonged QT interval 05/14/2015    Patient Active Problem List   Diagnosis Date Noted  . Diastolic dysfunction 04/54/0981  . Prolonged QT interval 05/14/2015  . Palpitations 07/23/2014  . Generalized weakness 07/23/2014  . Hypokalemia 07/22/2014  . Diabetes mellitus ([place]) 07/22/2014  . Hypertension 07/22/2014  . COPD (chronic obstructive pulmonary disease) ([place]) 07/22/2014  . Depression 07/22/2014    Past Surgical History:  Procedure Laterality Date  . CATARACT EXTRACTION    . CESAREAN SECTION    . CHOLECYSTECTOMY      Current Outpatient Rx  . Order #: [id number] Class: Historical Med  . Order #: [id number] Class: Historical Med  . Order #: [id number] Class: Historical Med  . Order #: [id number] Class: Historical Med  . Order #:  [id number] Class: Historical Med  . Order #: [id number] Class: Historical Med  . Order #: [id number] Class: Historical Med  . Order #: [id number] Class: Historical Med  . Order #: [id number] Class: Historical Med  . Order #: [id number] Class: Historical Med  . Order #: [id number] Class: Historical Med  . Order #: [id number] Class: Normal  . Order #: [id number] Class: Normal  . Order #: [id number] Class: Print  . Order #: [id number] Class: Print    Allergies Benicar [olmesartan]; Codeine; and Sulfa antibiotics  Family History  Problem Relation Age of Onset  . Stroke Mother   . Diabetes Father   . Heart failure Father   . Hypertension Father   . Diabetes Sister   . Heart failure Sister   . Hypertension Sister   . Cancer Other     Social History Social History  Substance Use Topics  . Smoking status: Passive Smoke Exposure - Never Smoker  . Smokeless tobacco: Never Used  . Alcohol use No    Review of Systems  Constitutional: No fever/chills. Positive generalized weakness.  Eyes: No visual changes. ENT: No sore throat. Cardiovascular: Denies chest pain. Respiratory: Denies shortness of breath. Gastrointestinal: No abdominal pain. Positive nausea, no vomiting.  No diarrhea.  No constipation. Genitourinary: Negative for dysuria. Musculoskeletal: Negative for back pain. Skin: Negative for rash. Neurological: Negative for headaches, focal weakness or numbness. Positive tingling in the legs.   10-point ROS otherwise negative.  ____________________________________________   PHYSICAL EXAM:  VITAL SIGNS: ED Triage Vitals  Enc Vitals Group     BP 03/21/17 1622 135/69     Pulse Rate 03/21/17 1622 77  Resp 03/21/17 1622 18     Temp 03/21/17 1622 98.2 F (36.8 C)     Temp Source 03/21/17 1622 Oral     SpO2 03/21/17 1622 96 %     Weight 03/21/17 1623 (!) 303 lb (137.4 kg)     Height 03/21/17 1623 5\' 1"  (1.549 m)   Constitutional: Alert and oriented. Well appearing and in no acute  distress. Sitting upright in bedside chair.  Eyes: Conjunctivae are normal.  Head: Atraumatic. Nose: No congestion/rhinnorhea. Mouth/Throat: Mucous membranes are moist. Neck: No stridor.  Cardiovascular: Normal rate, regular rhythm. Good peripheral circulation. Grossly normal heart sounds.   Respiratory: Normal respiratory effort.  No retractions. Lungs CTAB. Gastrointestinal: Soft and nontender. No distention.  Musculoskeletal: No lower extremity tenderness nor edema. No gross deformities of extremities. Neurologic:  Normal speech and language. No gross focal neurologic deficits are appreciated.  Skin:  Skin is warm, dry and intact. No rash noted.  ____________________________________________   LABS (all labs ordered are listed, but only abnormal results are displayed)  Labs Reviewed  BASIC METABOLIC PANEL - Abnormal; Notable for the following:       Result Value   Sodium 133 (*)    Potassium 2.6 (*)    Chloride 86 (*)    CO2 35 (*)    Glucose, Bld 340 (*)    All other components within normal limits  CBC - Abnormal; Notable for the following:    WBC 11.0 (*)    Hemoglobin 16.4 (*)    All other components within normal limits  CBG MONITORING, ED - Abnormal; Notable for the following:    Glucose-Capillary 383 (*)    All other components within normal limits  CBG MONITORING, ED - Abnormal; Notable for the following:    Glucose-Capillary 225 (*)    All other components within normal limits  MAGNESIUM   ____________________________________________  EKG   EKG Interpretation  Date/Time:  Monday March 21 2017 16:29:17 EDT Ventricular Rate:  73 PR Interval:  130 QRS Duration: 94 QT Interval:  460 QTC Calculation: 506 R Axis:   -34 Text Interpretation:  Normal sinus rhythm Left axis deviation Pulmonary disease pattern Moderate voltage criteria for LVH, may be normal variant Abnormal ECG No STEMI Similar to prior tracing.  Confirmed by LONG MD, JOSHUA [phone]) on 03/21/2017  7:12:21 PM       ____________________________________________  RADIOLOGY  None ____________________________________________   PROCEDURES  Procedure(s) performed:   Procedures  None ____________________________________________   INITIAL IMPRESSION / ASSESSMENT AND PLAN / ED COURSE  Pertinent labs & imaging results that were available during my care of the patient were reviewed by me and considered in my medical decision making (see chart for details).  Patient presents to the emergency department for evaluation of weakness, tingling in the legs, nausea. She has had symptoms like this before with hypokalemia. Hypokalemia thought to be secondary to home medications and has been instructed to take potassium supplementation when she begins to feel symptoms. The patient is very well-appearing. Her vital signs are normal. She is ambulatory to the bathroom without difficulty. Review of past potassium levels show that she is lower than normal but not far off of her baseline. No other evidence of acute process. No EKG changes. QTc prolongation remains and is seen in prior tracings. Plan for IVF with potassium supplementation. Will check magnesium level and reassess after potassium.   09:46 PM Patient with improved symptoms. Discussed discharge with plan for oral supplementation and PCP follow  up in the next 2-3 days for repeat lab work. She will call tomorrow AM to schedule appointment. Discussed return precautions in detail. Magnesium level is normal.   At this time, I do not feel there is any life-threatening condition present. I have reviewed and discussed all results (EKG, imaging, lab, urine as appropriate), exam findings with patient. I have reviewed nursing notes and appropriate previous records.  I feel the patient is safe to be discharged home without further emergent workup. Discussed usual and customary return precautions. Patient and family (if present) verbalize understanding and  are comfortable with this plan.  Patient will follow-up with their primary care provider. If they do not have a primary care provider, information for follow-up has been provided to them. All questions have been answered.  ____________________________________________  FINAL CLINICAL IMPRESSION(S) / ED DIAGNOSES  Final diagnoses:  Generalized weakness  Hypokalemia  Dizzy     MEDICATIONS GIVEN DURING THIS VISIT:  Medications  potassium chloride 10 mEq in 100 mL IVPB (10 mEq Intravenous New Bag/Given 03/21/17 2051)  potassium chloride SA (K-DUR,KLOR-CON) CR tablet 40 mEq (40 mEq Oral Given 03/21/17 1943)     NEW OUTPATIENT MEDICATIONS STARTED DURING THIS VISIT:  New Prescriptions   MECLIZINE (ANTIVERT) 12.5 MG TABLET    Take 1 tablet (12.5 mg total) by mouth 3 (three) times daily as needed for dizziness.   POTASSIUM CHLORIDE SA (K-DUR,KLOR-CON) 20 MEQ TABLET    Take 2 tablets (40 mEq total) by mouth 2 (two) times daily.      Note:  This document was prepared using Dragon voice recognition software and may include unintentional dictation errors.  Nanda Quinton, MD Emergency Medicine   Margette Fast, MD 03/21/17 [id number]

## 2017-03-21 NOTE — ED Notes (Signed)
Informed pt of need for urine sample. Pt states that she has just used the bathroom and cannot provide one at this time. Pt instructed to let nursing staff know when she is able to provide one.

## 2017-05-13 ENCOUNTER — Encounter: Payer: Self-pay | Admitting: Obstetrics & Gynecology

## 2017-08-09 DIAGNOSIS — E1040 Type 1 diabetes mellitus with diabetic neuropathy, unspecified: Secondary | ICD-10-CM | POA: Insufficient documentation

## 2017-08-09 DIAGNOSIS — N814 Uterovaginal prolapse, unspecified: Secondary | ICD-10-CM | POA: Insufficient documentation

## 2017-08-11 ENCOUNTER — Encounter: Payer: Self-pay | Admitting: Obstetrics & Gynecology

## 2017-09-07 ENCOUNTER — Encounter: Payer: Self-pay | Admitting: *Deleted

## 2017-09-09 ENCOUNTER — Encounter (INDEPENDENT_AMBULATORY_CARE_PROVIDER_SITE_OTHER): Payer: Self-pay

## 2017-09-09 ENCOUNTER — Ambulatory Visit (INDEPENDENT_AMBULATORY_CARE_PROVIDER_SITE_OTHER): Payer: PRIVATE HEALTH INSURANCE | Admitting: Obstetrics & Gynecology

## 2017-09-09 ENCOUNTER — Encounter: Payer: Self-pay | Admitting: Obstetrics & Gynecology

## 2017-09-09 VITALS — BP 138/84 | HR 70 | Ht 61.0 in | Wt 288.0 lb

## 2017-09-09 DIAGNOSIS — L03119 Cellulitis of unspecified part of limb: Secondary | ICD-10-CM

## 2017-09-09 DIAGNOSIS — R102 Pelvic and perineal pain: Secondary | ICD-10-CM

## 2017-09-09 DIAGNOSIS — E10628 Type 1 diabetes mellitus with other skin complications: Secondary | ICD-10-CM

## 2017-09-09 DIAGNOSIS — B3731 Acute candidiasis of vulva and vagina: Secondary | ICD-10-CM

## 2017-09-09 DIAGNOSIS — B373 Candidiasis of vulva and vagina: Secondary | ICD-10-CM | POA: Diagnosis not present

## 2017-09-09 MED ORDER — FLUCONAZOLE 100 MG PO TABS: 100.0000 mg | ORAL_TABLET | Freq: Every day | ORAL | 0 refills | Status: DC

## 2017-09-09 MED ORDER — SILVER SULFADIAZINE 1 % EX CREA: 1.0000 "application " | TOPICAL_CREAM | Freq: Every day | CUTANEOUS | 0 refills | Status: DC

## 2017-09-09 NOTE — Progress Notes (Signed)
Patient ID: [name], female   DOB: 10/23/1961, 56 y.o.   MRN: [id number]      Chief Complaint  Patient presents with  . Vaginal Prolapse    referral    Blood pressure 138/84, pulse 70, height 5\' 1"  (1.549 m), weight 288 lb (130.6 kg), last menstrual period 08/06/2012.  56 y.o. [id number] Patient's last menstrual period was 08/06/2012. The current method of family planning is post menopausal status 2012 was her last period  Outpatient Encounter Prescriptions as of 09/09/2017  Medication Sig Note  . albuterol (PROAIR HFA) 108 (90 BASE) MCG/ACT inhaler Inhale 2 puffs into the lungs every 6 (six) hours as needed for wheezing or shortness of breath.   Marland Kitchen amLODipine (NORVASC) 5 MG tablet Take 5 mg by mouth daily.    Marland Kitchen atorvastatin (LIPITOR) 20 MG tablet Take 20 mg by mouth daily.   Marland Kitchen escitalopram (LEXAPRO) 20 MG tablet Take 20 mg by mouth every evening.   Marland Kitchen esomeprazole (NEXIUM) 20 MG capsule Take 20 mg by mouth daily at 12 noon.   . fluticasone furoate-vilanterol (BREO ELLIPTA) 200-25 MCG/INH AEPB Inhale 1 puff into the lungs daily.   . insulin NPH Human (HUMULIN N,NOVOLIN N) 100 UNIT/ML injection Inject 80-100 Units into the skin 2 (two) times daily. 100 units in the morning and 80 units at bedtime   . insulin regular (NOVOLIN R,HUMULIN R) 250 units/2.64mL (100 units/mL) injection Inject 60 Units into the skin 3 (three) times daily before meals. 8am, 12p, and 6pm   . LORazepam (ATIVAN) 1 MG tablet Take 1 mg by mouth at bedtime.    . metolazone (ZAROXOLYN) 5 MG tablet Take 5 mg by mouth daily as needed (for swelling).  03/21/2017: Patient has taken this medication consistent for 3 days due to swelling  . propranolol ER (INDERAL LA) 120 MG 24 hr capsule Take 120 mg by mouth at bedtime.    Marland Kitchen spironolactone (ALDACTONE) 25 MG tablet Take 1 tablet (25 mg total) by mouth daily.   Marland Kitchen torsemide (DEMADEX) 20 MG tablet Take 1 tablet (20 mg total) by mouth 2 (two) times daily.   . fluconazole  (DIFLUCAN) 100 MG tablet Take 1 tablet (100 mg total) by mouth daily.   Marland Kitchen gabapentin (NEURONTIN) 600 MG tablet Take 600 mg by mouth at bedtime.    . meclizine (ANTIVERT) 12.5 MG tablet Take 1 tablet (12.5 mg total) by mouth 3 (three) times daily as needed for dizziness. (Patient not taking: Reported on 09/09/2017)   . potassium chloride SA (K-DUR,KLOR-CON) 20 MEQ tablet Take 2 tablets (40 mEq total) by mouth 2 (two) times daily.   . silver sulfADIAZINE (SILVADENE) 1 % cream Apply 1 application topically daily.    No facility-administered encounter medications on file as of 09/09/2017.     Subjective [name] is seen today as a referral from [facility] in [place] The records included suggest the possibility of uterine prolapse However the patient states that she has not had an exam during this period which would have in anyway I suggested that So essentially [name] has had sort of a stabbing pain in her vagina she says for the last couple of months She has not had it prior to this is just been the last 2 months She states that she has more pain when she urinates She had a urinalysis done 2 days ago but I do not have the results of that and she was placed empirically on Cipro and states that  there is a culture pending For simplicity say Told her to stop Cipro for the time being and to call her office today and Monday to see if her urine culture came back positive or not She denies any bleeding She states the pain comes and goes and doesn't even last a minute and is very sharp She states it happens every time she goes to the bathroom and then maybe 2 or 3 times other times during the day She states this pain also occurs when she has a bowel movement She basically goes and has a loose stool every time she eats and is been that way since she had her gallbladder out 16 years ago She states she has 3 loose bowel movements a day not watery She never has constipation issues She  has never had specific symptoms consistent with prolapse and has not noticed any organs bladder uterus otherwise hanging out of her vagina She states that intercourse uncomfortable with insertion and has been last several years  Objective General WDWN  Obese female NAD Vulva:  No lesions but there is erythema and a distribution consistent with a yeast vulvitis Vagina:  No lesions but an obvious she's to infection vaginally Cervix:  Her cervix is very high not in anyway to be confused with prolapse I can definitively say she doesn't have either bladder or uterine prolapse or rectal prolapse, her cervix has no lesions Uterus:  I cannot assess but no obvious masses Adnexa: ovaries: Cannot assess but no obvious masses,    I painted [name]'s vagina vulva and perirectal area with gentian violet  Both lower extremities have evidence of chronic cellulitis she does use compression garments but 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 Yeast vaginitis B37.3   2. Vulvovaginal candidiasis B37.3   3. Cellulitis of lower extremity, unspecified laterality L03.119   4. 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 Pelvic pain in female R10.2 US PELVIS (TRANSABDOMINAL ONLY)    US PELVIS TRANSVANGINAL NON-OB (TV ONLY)    Established relevant diagnosis(es): diabetes  Plan/Recommendations: Meds ordered this encounter  Medications  . fluconazole (DIFLUCAN) 100 MG tablet    Sig: Take 1 tablet (100 mg total) by mouth daily.    Dispense:  14 tablet    Refill:  0  . silver sulfADIAZINE (SILVADENE) 1 % cream    Sig: Apply 1 application topically daily.    Dispense:  50 g    Refill:  0    Labs or Scans  Ordered: Orders Placed This Encounter  Procedures  . US PELVIS (TRANSABDOMINAL ONLY)  . US PELVIS TRANSVANGINAL NON-OB (TV ONLY)    Management:: First of all [name] most certainly does not have any pelvic organ prolapse She does have yeast vulvovaginitis I treated her with gentian violet in the office today and she'll take Diflucan 100 mg daily for the next 2 weeks I'm suspicious that she could have GI related symptoms or may be even interstitial cystitis It would be helpful to get her urinary culture results back and she is going to do that today and/or Monday in the meantime she'll stop her Cipro to limit the complicating factors See her back in 2 weeks  and we will do a ultrasound at that time to evaluate her pelvic organ anatomy Additionally have given her Silvadene cream to place in her legs at night to help with her lower extremity cellulitis  Follow up Return in about 2 weeks (around 09/23/2017) for GYN sono, Follow up, with Dr Elonda Husky.   All questions were answered.  Past Medical History:  Diagnosis Date  . Asthma   . COPD (chronic obstructive pulmonary disease) ([place])   . Depression   . Diabetes mellitus   . Diastolic dysfunction 01/10/538  . Hypertension   . Prolonged QT interval 05/14/2015    Past Surgical History:  Procedure Laterality Date  . CATARACT EXTRACTION    . CESAREAN SECTION    . CHOLECYSTECTOMY      OB History    Gravida Para Term Preterm AB Living   3 2 2   1 2    SAB TAB Ectopic Multiple Live Births   1              Allergies  Allergen Reactions  . Benicar [Olmesartan] Swelling  . Codeine Other (See Comments)    Confusion   . Sulfa Antibiotics Swelling    Whole face swells  . Trulicity [Dulaglutide] Diarrhea    Social History   Social History  . Marital status: Married    Spouse name: N/A  . Number of children: N/A  . Years of education: N/A   Social History Main Topics  . Smoking status: Passive Smoke Exposure - Never Smoker  .  Smokeless tobacco: Never Used  . Alcohol use No  . Drug use: No  . Sexual activity: Yes    Birth control/ protection: None   Other Topics Concern  . None   Social History Narrative  . None    Family History  Problem Relation Age of Onset  . Stroke Mother   . Diabetes Father   . Heart failure Father   . Hypertension Father   . Diabetes Sister   . Heart failure Sister   . Hypertension Sister   . Stroke Sister   . Cancer Other   . Stroke Sister

## 2017-09-23 ENCOUNTER — Encounter (HOSPITAL_COMMUNITY): Payer: Self-pay | Admitting: Emergency Medicine

## 2017-09-23 ENCOUNTER — Emergency Department (HOSPITAL_COMMUNITY): Payer: PRIVATE HEALTH INSURANCE

## 2017-09-23 ENCOUNTER — Other Ambulatory Visit: Payer: Self-pay

## 2017-09-23 ENCOUNTER — Emergency Department (HOSPITAL_COMMUNITY)
Admission: EM | Admit: 2017-09-23 | Discharge: 2017-09-23 | Disposition: A | Discharge status: Home / Self Care | Payer: PRIVATE HEALTH INSURANCE | Attending: Emergency Medicine | Admitting: Emergency Medicine

## 2017-09-23 DIAGNOSIS — Z7722 Contact with and (suspected) exposure to environmental tobacco smoke (acute) (chronic): Secondary | ICD-10-CM | POA: Diagnosis not present

## 2017-09-23 DIAGNOSIS — J449 Chronic obstructive pulmonary disease, unspecified: Secondary | ICD-10-CM | POA: Diagnosis not present

## 2017-09-23 DIAGNOSIS — J45909 Unspecified asthma, uncomplicated: Secondary | ICD-10-CM | POA: Diagnosis not present

## 2017-09-23 DIAGNOSIS — I110 Hypertensive heart disease with heart failure: Secondary | ICD-10-CM | POA: Insufficient documentation

## 2017-09-23 DIAGNOSIS — E876 Hypokalemia: Secondary | ICD-10-CM

## 2017-09-23 DIAGNOSIS — R1033 Periumbilical pain: Secondary | ICD-10-CM | POA: Insufficient documentation

## 2017-09-23 DIAGNOSIS — E119 Type 2 diabetes mellitus without complications: Secondary | ICD-10-CM | POA: Insufficient documentation

## 2017-09-23 DIAGNOSIS — I5030 Unspecified diastolic (congestive) heart failure: Secondary | ICD-10-CM | POA: Diagnosis not present

## 2017-09-23 DIAGNOSIS — N390 Urinary tract infection, site not specified: Secondary | ICD-10-CM

## 2017-09-23 DIAGNOSIS — Z79899 Other long term (current) drug therapy: Secondary | ICD-10-CM | POA: Insufficient documentation

## 2017-09-23 DIAGNOSIS — R42 Dizziness and giddiness: Secondary | ICD-10-CM | POA: Diagnosis not present

## 2017-09-23 DIAGNOSIS — Z794 Long term (current) use of insulin: Secondary | ICD-10-CM | POA: Insufficient documentation

## 2017-09-23 LAB — CBG MONITORING, ED: Glucose-Capillary: 89 mg/dL (ref 65–99)

## 2017-09-23 LAB — URINALYSIS, ROUTINE W REFLEX MICROSCOPIC
Bilirubin Urine: NEGATIVE
Glucose, UA: NEGATIVE mg/dL
Ketones, ur: NEGATIVE mg/dL
Leukocytes, UA: NEGATIVE
Nitrite: NEGATIVE
Protein, ur: 100 mg/dL — AB
Specific Gravity, Urine: 1.015 (ref 1.005–1.030)
pH: 5 (ref 5.0–8.0)

## 2017-09-23 LAB — BASIC METABOLIC PANEL
Anion gap: 13 (ref 5–15)
BUN: 19 mg/dL (ref 6–20)
CO2: 38 mmol/L — ABNORMAL HIGH (ref 22–32)
Calcium: 10.3 mg/dL (ref 8.9–10.3)
Chloride: 94 mmol/L — ABNORMAL LOW (ref 101–111)
Creatinine, Ser: 0.84 mg/dL (ref 0.44–1.00)
GFR calc Af Amer: >60 mL/min (ref >60)
GFR calc non Af Amer: >60 mL/min (ref >60)
Glucose, Bld: 72 mg/dL (ref 65–99)
Potassium: 2.5 mmol/L — CL (ref 3.5–5.1)
Sodium: 145 mmol/L (ref 135–145)

## 2017-09-23 LAB — CBC WITH DIFFERENTIAL/PLATELET
Basophils Absolute: 0 10*3/uL (ref 0.0–0.1)
Basophils Relative: 0 %
Eosinophils Absolute: 0.2 10*3/uL (ref 0.0–0.7)
Eosinophils Relative: 2 %
HCT: 49 % — ABNORMAL HIGH (ref 36.0–46.0)
Hemoglobin: 17.2 g/dL — ABNORMAL HIGH (ref 12.0–15.0)
Lymphocytes Relative: 22 %
Lymphs Abs: 2.9 10*3/uL (ref 0.7–4.0)
MCH: 32.5 pg (ref 26.0–34.0)
MCHC: 35.1 g/dL (ref 30.0–36.0)
MCV: 92.6 fL (ref 78.0–100.0)
Monocytes Absolute: 0.7 10*3/uL (ref 0.1–1.0)
Monocytes Relative: 6 %
Neutro Abs: 9.5 10*3/uL — ABNORMAL HIGH (ref 1.7–7.7)
Neutrophils Relative %: 70 %
Platelets: 314 10*3/uL (ref 150–400)
RBC: 5.29 MIL/uL — ABNORMAL HIGH (ref 3.87–5.11)
RDW: 13.9 % (ref 11.5–15.5)
WBC: 13.4 10*3/uL — ABNORMAL HIGH (ref 4.0–10.5)

## 2017-09-23 LAB — BLOOD GAS, VENOUS
Acid-Base Excess: 13.6 mmol/L — ABNORMAL HIGH (ref 0.0–2.0)
Bicarbonate: 34.8 mmol/L — ABNORMAL HIGH (ref 20.0–28.0)
Drawn by: 1517
O2 Saturation: 76.5 %
Patient temperature: 37
pCO2, Ven: 57.6 mmHg (ref 44.0–60.0)
pH, Ven: 7.441 — ABNORMAL HIGH (ref 7.250–7.430)
pO2, Ven: 42.7 mmHg (ref 32.0–45.0)

## 2017-09-23 LAB — GLUCOSE, CAPILLARY
Glucose-Capillary: 58 mg/dL — ABNORMAL LOW (ref 65–99)
Glucose-Capillary: 90 mg/dL (ref 65–99)

## 2017-09-23 IMAGING — CT CT HEAD W/O CM
3 series · 16 of 47 positions shown, 19 images · non-contrast
Comparison: None.

CLINICAL DATA: Vertigo since last evening.  Hyperglycemia.

EXAM:
CT HEAD WITHOUT CONTRAST
TECHNIQUE: Contiguous axial images were obtained from the base of the skull
through the vertex without intravenous contrast.

[Series 2: head wo · axial · 0.44mm/px · z∈[+82,+212]mm · 10 of 32 slices shown, 13 images]
[im 3/32  brain]
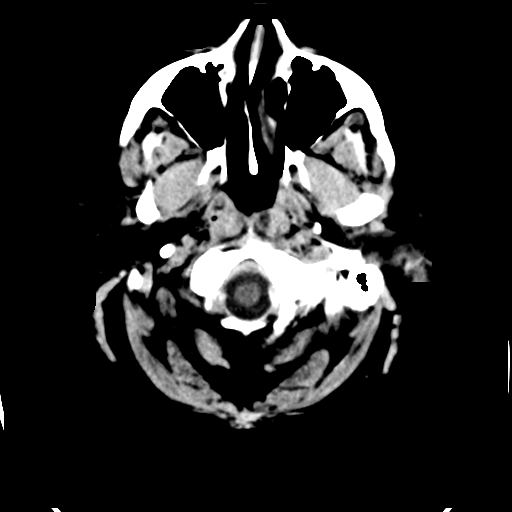
[im 3/32  bone]
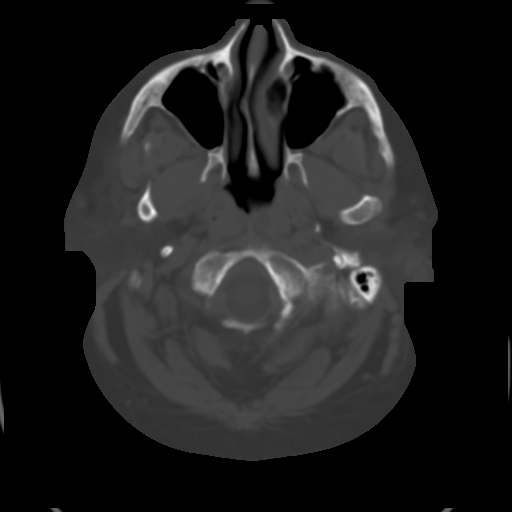
[im 6/32  brain]
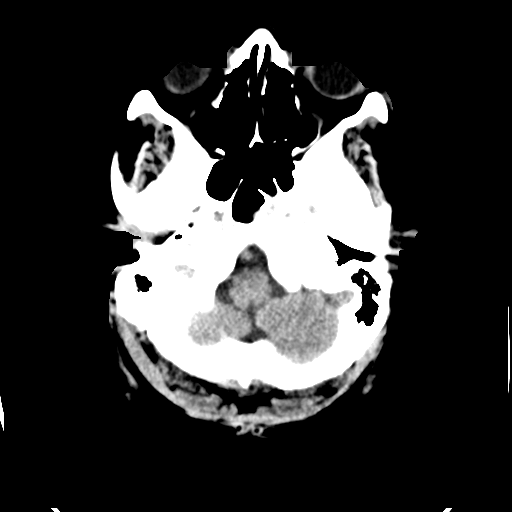
[im 9/32  brain]
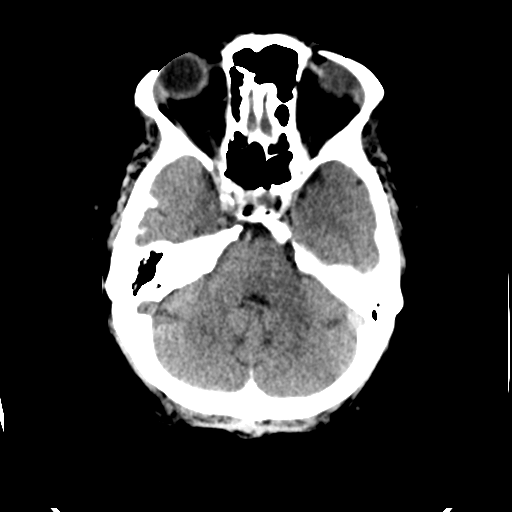
[im 11/32  brain]
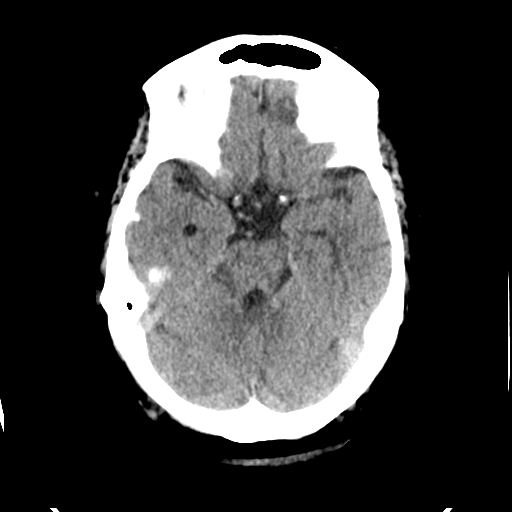
[im 14/32  brain]
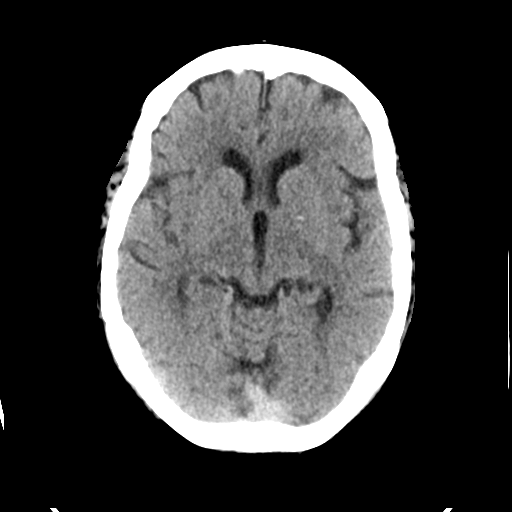
[im 14/32  bone]
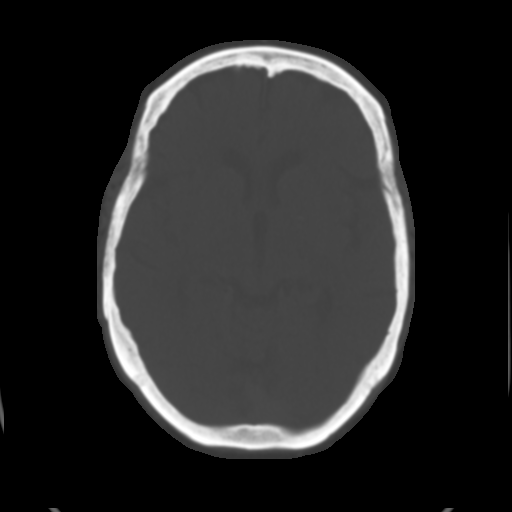
[im 18/32  brain]
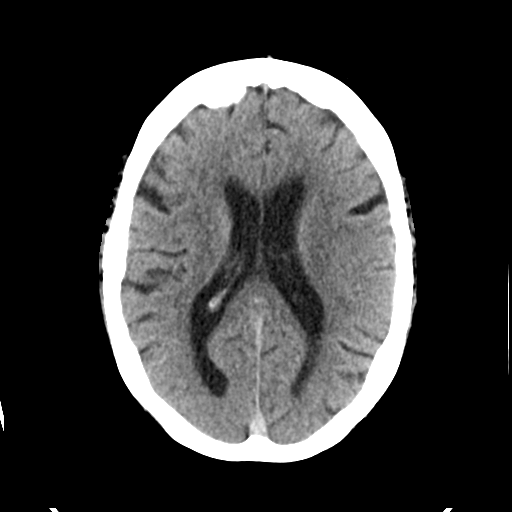
[im 21/32  brain]
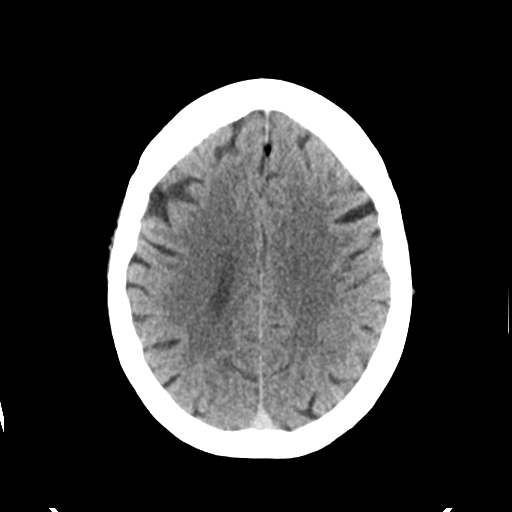
[im 24/32  brain]
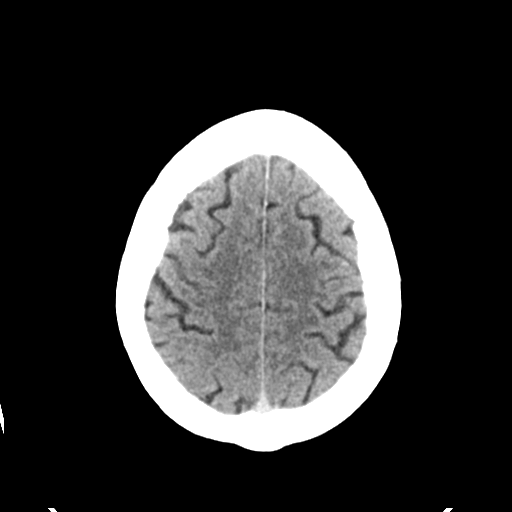
[im 26/32  brain]
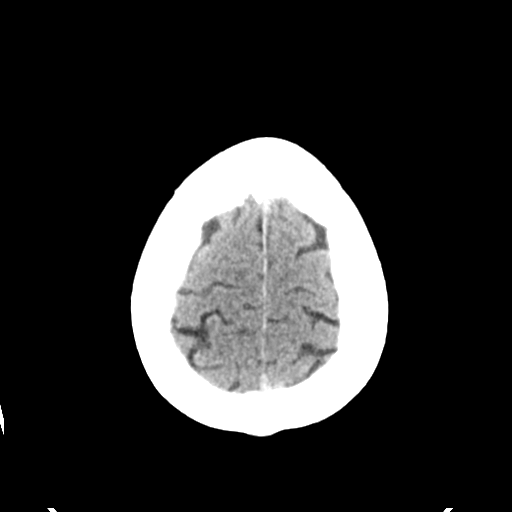
[im 26/32  bone]
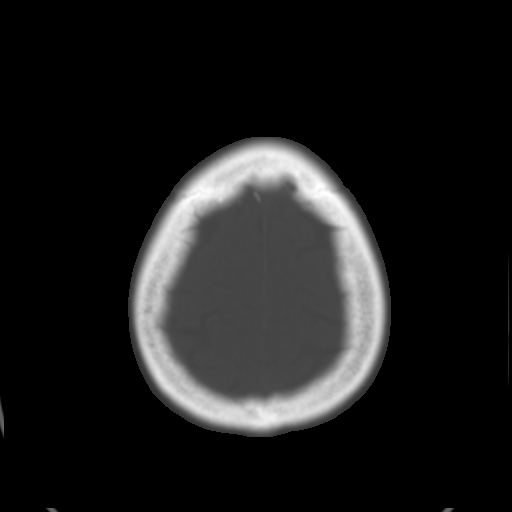
[im 29/32  brain]
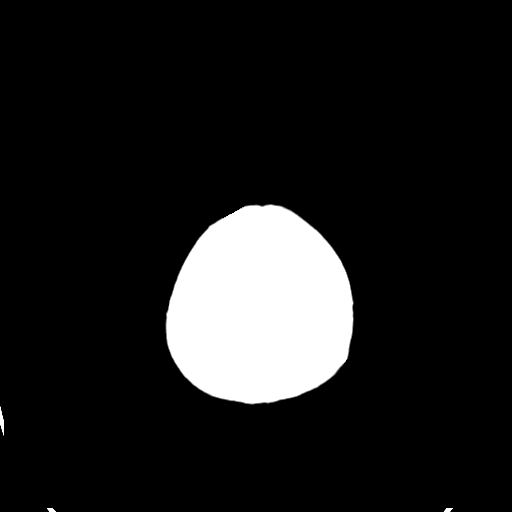

[Series 4: coronal soft tissue · coronal · 0.38mm/px · 3 of 65 slices shown]
[im 22/65  brain]
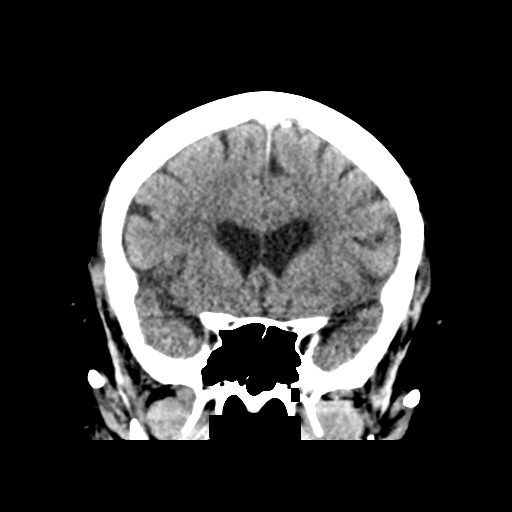
[im 29/65  brain]
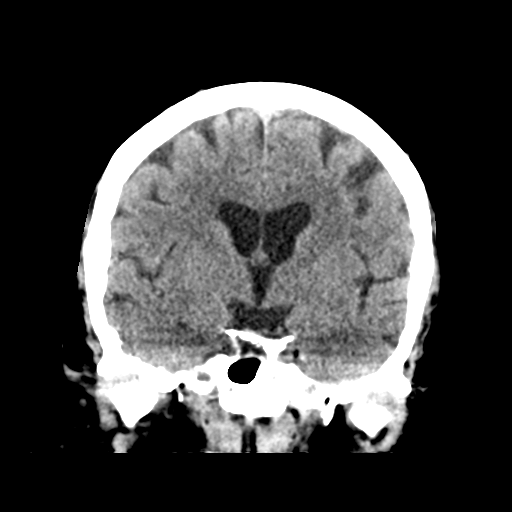
[im 36/65  brain]
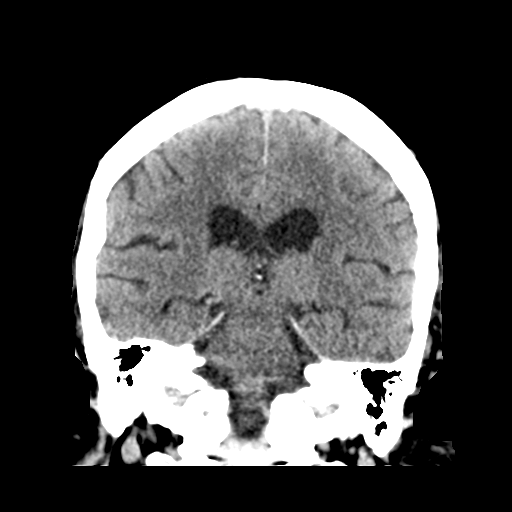

[Series 5: sagittal soft tissue · sagittal · 0.36mm/px · 3 of 67 slices shown]
[im 23/67  brain]
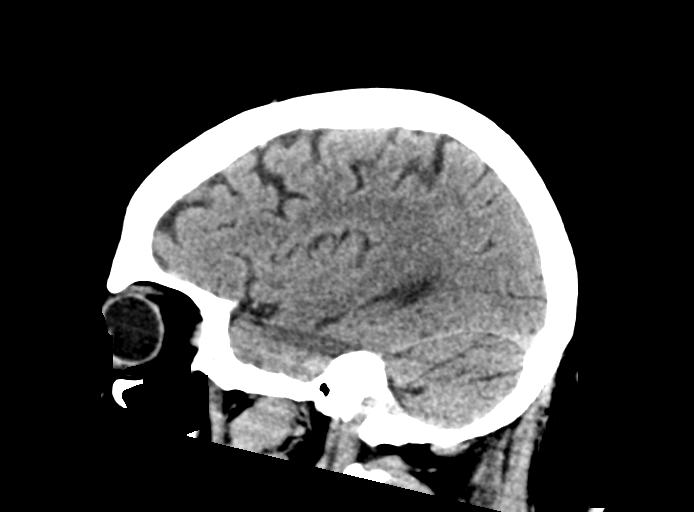
[im 34/67  brain]
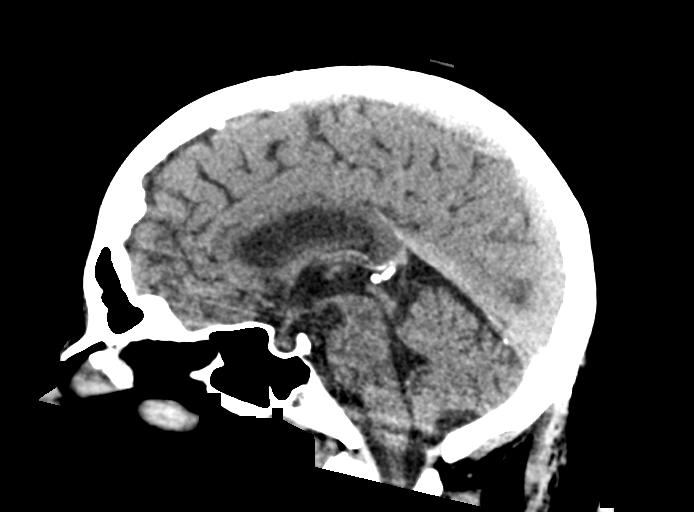
[im 45/67  brain]
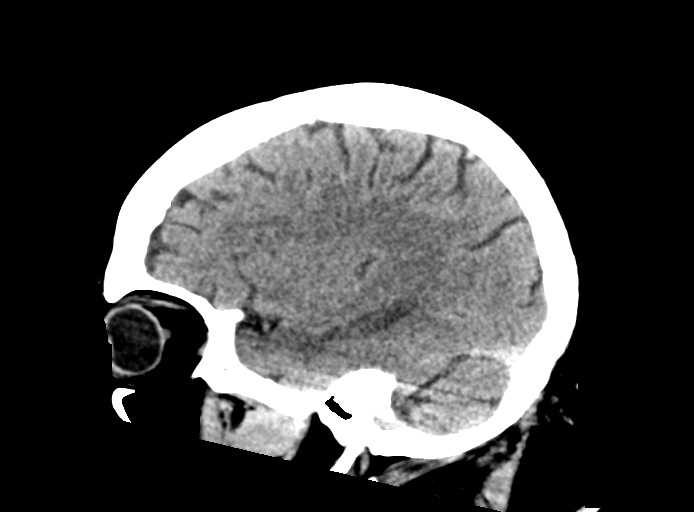

[16 of 47 positions shown; findings below may reference images not displayed]

FINDINGS: Brain: No evidence of acute infarction, hemorrhage, hydrocephalus,
extra-axial collection or mass lesion/mass effect. Minimal
periventricular white matter small vessel ischemic disease.

Vascular: No hyperdense vessel or unexpected calcification. Mild to
moderate atherosclerotic calcifications of the cavernous sinus
carotids.

Skull: Normal. Negative for fracture or focal lesion.

Sinuses/Orbits: No acute finding. Small mucous retention cyst of the
right maxillary sinus is noted of the right maxillary sinus,
incompletely included.

Other: None
IMPRESSION: No acute intracranial abnormality.  Minimal small vessel ischemia.

## 2017-09-23 IMAGING — CT CT RENAL STONE PROTOCOL
2 of 4 series · 16 of 46 positions shown, 18 images · non-contrast
Comparison: [DATE]

CLINICAL DATA: Dizziness since last night. Intermittent generalized
abdominal pain. Bilateral lower and umbilical abdominal pain.
Nausea.

EXAM:
CT ABDOMEN AND PELVIS WITHOUT CONTRAST
TECHNIQUE: Multidetector CT imaging of the abdomen and pelvis was performed
following the standard protocol without IV contrast.

[Series 2: axial st · axial · 0.94mm/px · z∈[+990,+1426]mm · 13 of 97 slices shown, 15 images]
[im 5/97  soft-tissue]
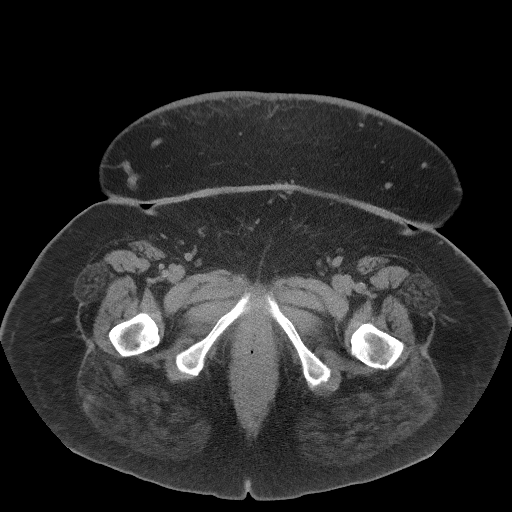
[im 5/97  bone]
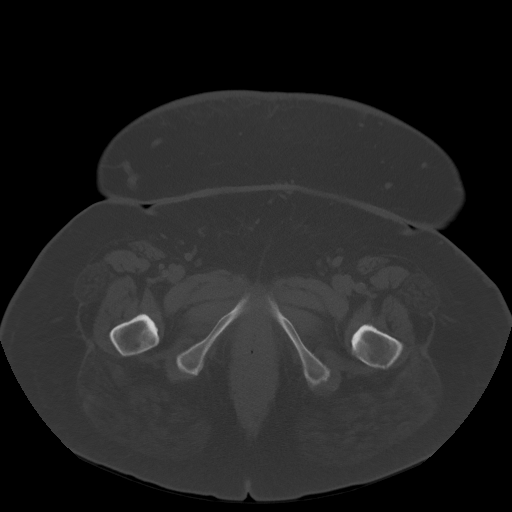
[im 14/97  soft-tissue]
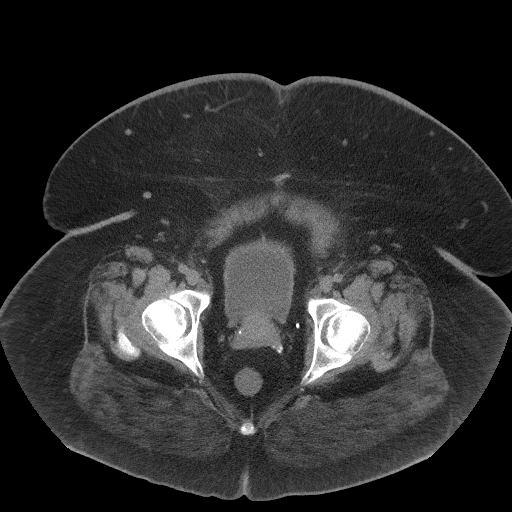
[im 19/97  soft-tissue]
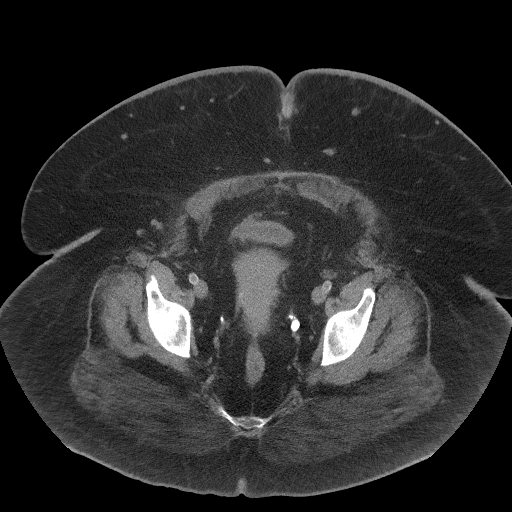
[im 28/97  soft-tissue]
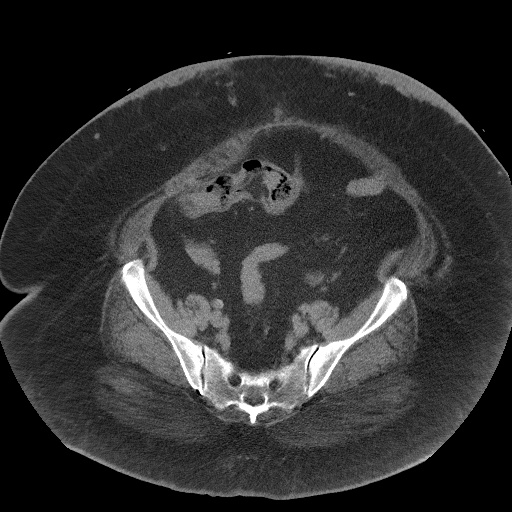
[im 33/97  soft-tissue]
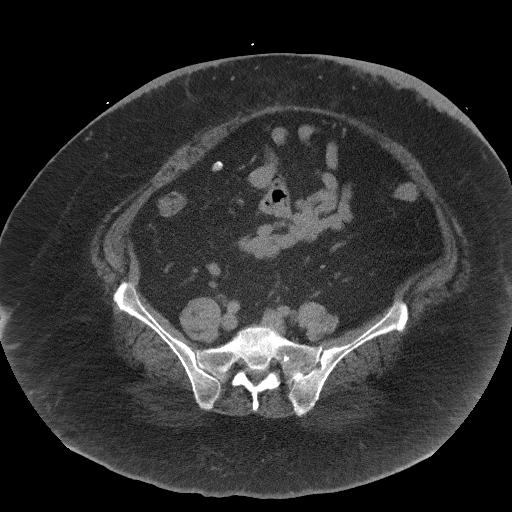
[im 42/97  soft-tissue]
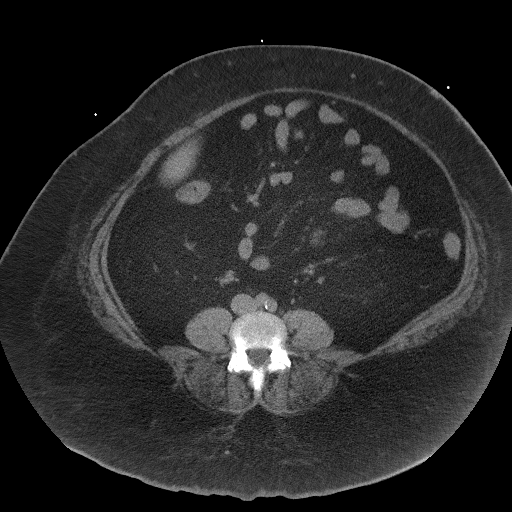
[im 51/97  soft-tissue]
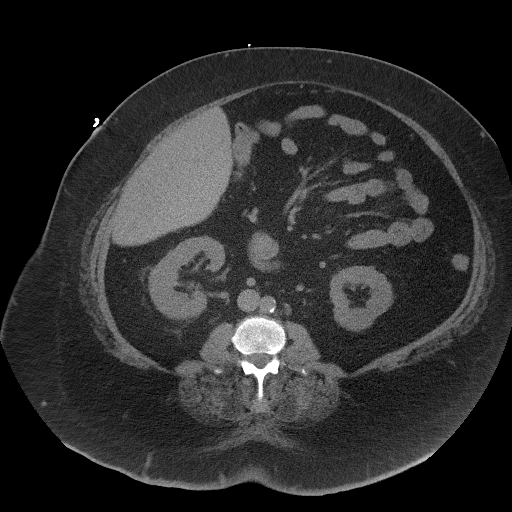
[im 55/97  soft-tissue]
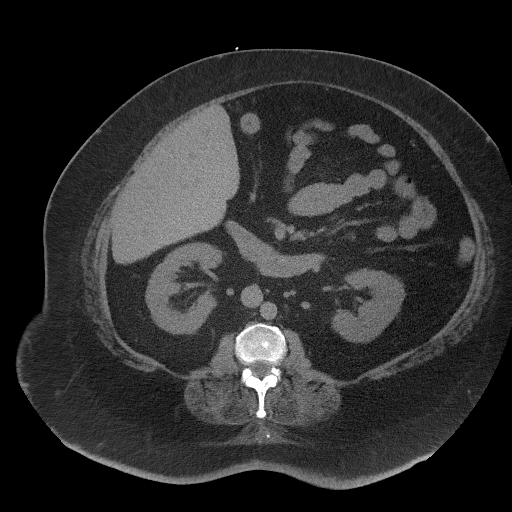
[im 65/97  soft-tissue]
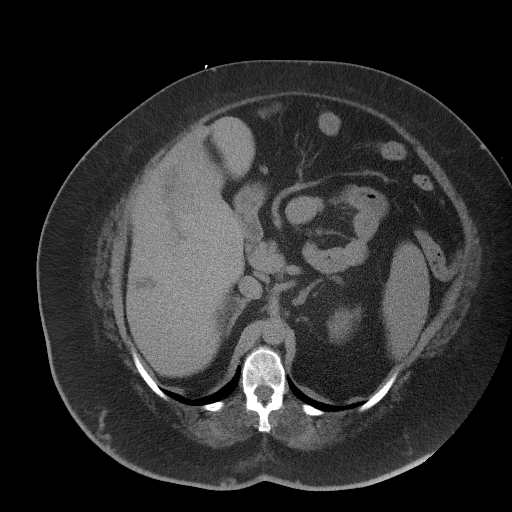
[im 65/97  bone]
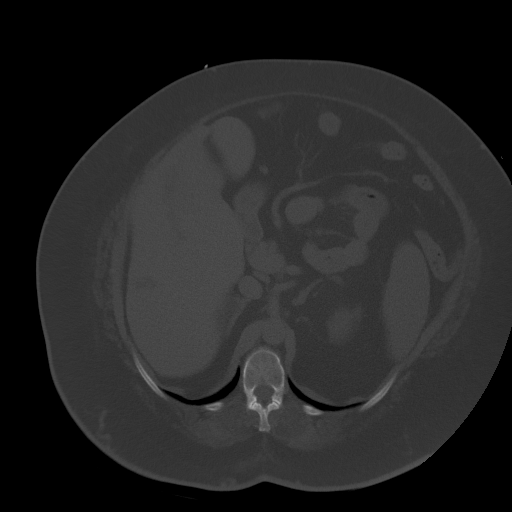
[im 69/97  soft-tissue]
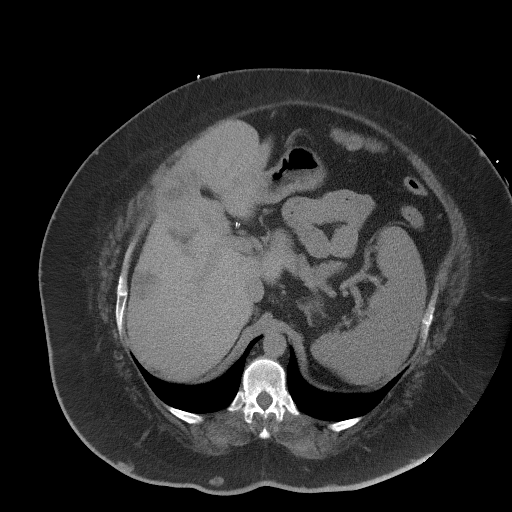
[im 78/97  soft-tissue]
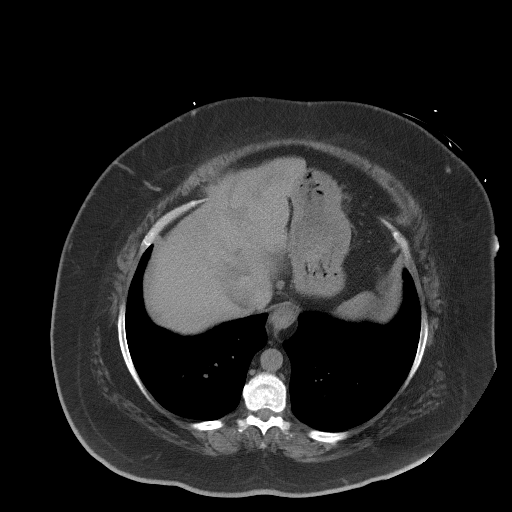
[im 83/97  soft-tissue]
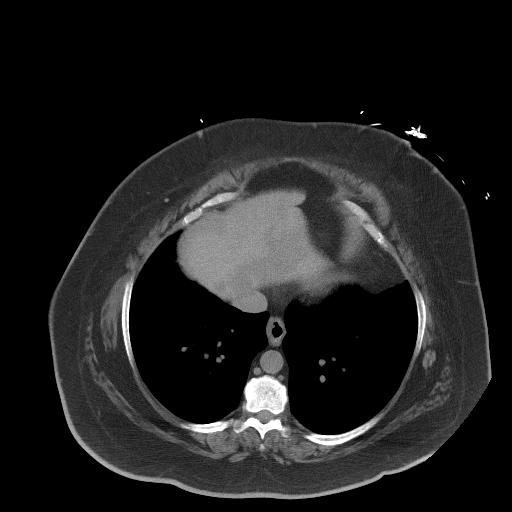
[im 92/97  soft-tissue]
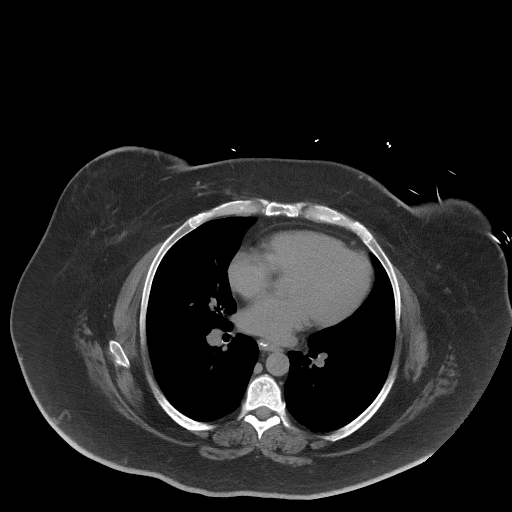

[Series 5: coronal st · coronal · 0.95mm/px · 3 of 150 slices shown]
[im 50/150  soft-tissue]
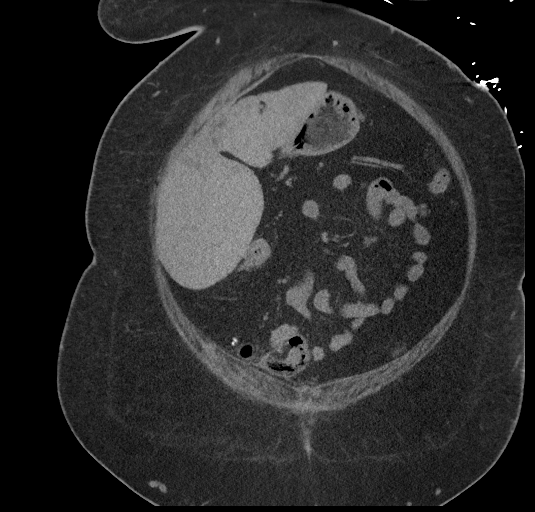
[im 67/150  soft-tissue]
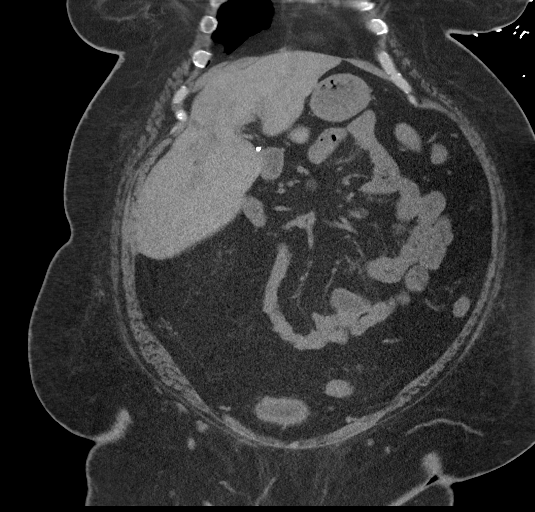
[im 83/150  soft-tissue]
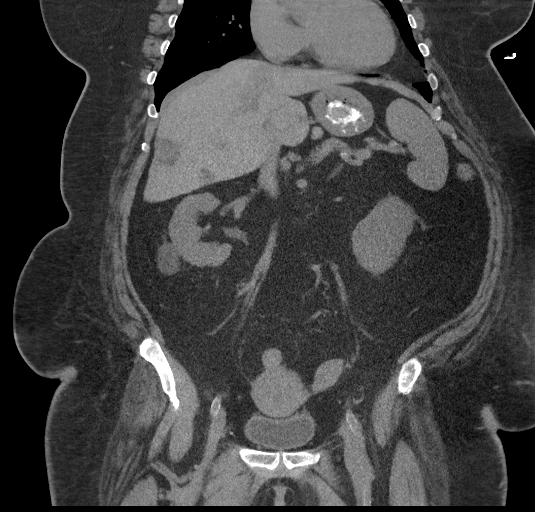

[16 of 46 positions shown; findings below may reference images not displayed]

FINDINGS: Lower chest: The lung bases are clear. Small amount of residual
contrast material or opaque medication in the esophagus may indicate
reflux or dysmotility.

Hepatobiliary: Hepatomegaly. Wedge-shaped hypodense lesions
demonstrated throughout the liver. This pattern is increasing since
the previous study. Appearance is suspicious for metastatic disease
although heterogeneous fatty infiltration or infarcts could also
have this appearance. Suggest follow-up evaluation with elective
MRI. Surgical absence of the gallbladder. No bile duct dilatation.

Pancreas: Pancreas is atrophic but otherwise appears normal.

Spleen: Normal in size without focal abnormality.

Adrenals/Urinary Tract: No adrenal gland nodules. Kidneys are
symmetrical in size. No hydronephrosis or hydroureter. Small cyst on
the right kidney. No renal, ureteral, or bladder stones. Bladder
wall is not thickened.

Stomach/Bowel: Stomach, small bowel, and colon are mostly
decompressed. Scattered stool in the colon. No inflammatory changes
are suggested. Appendix is not identified.

Vascular/Lymphatic: Aortic atherosclerosis. No enlarged abdominal or
pelvic lymph nodes.

Reproductive: Uterus and bilateral adnexa are unremarkable.

Other: No abdominal wall hernia or abnormality. No abdominopelvic
ascites.

Musculoskeletal: No acute or significant osseous findings.
IMPRESSION: 1. Hepatomegaly with wedge-shaped hypodense lesions demonstrated
throughout the liver. Differential diagnosis includes metastatic
disease, heterogeneous fatty infiltration, or infarcts. Suggest
follow-up evaluation with elective MRI for further characterization.
2. No renal or ureteral stone or obstruction.
3. No bowel obstruction or inflammation.
4. Aortic atherosclerosis.

## 2017-09-23 MED ORDER — POTASSIUM CHLORIDE CRYS ER 20 MEQ PO TBCR
40.0000 meq | EXTENDED_RELEASE_TABLET | Freq: Once | ORAL | Status: AC
  Administered 2017-09-23: 40 meq via ORAL
  Filled 2017-09-23: qty 2

## 2017-09-23 MED ORDER — MECLIZINE HCL 12.5 MG PO TABS: 12.5000 mg | ORAL_TABLET | Freq: Three times a day (TID) | ORAL | 0 refills | Status: DC | PRN

## 2017-09-23 MED ORDER — POTASSIUM CHLORIDE 10 MEQ/100ML IV SOLN
10.0000 meq | INTRAVENOUS | Status: AC
  Administered 2017-09-23: 10 meq via INTRAVENOUS
  Filled 2017-09-23: qty 100

## 2017-09-23 MED ORDER — MECLIZINE HCL 12.5 MG PO TABS
12.5000 mg | ORAL_TABLET | Freq: Once | ORAL | Status: AC
  Administered 2017-09-23: 12.5 mg via ORAL
  Filled 2017-09-23: qty 1

## 2017-09-23 MED ORDER — MAGNESIUM SULFATE 2 GM/50ML IV SOLN
2.0000 g | Freq: Once | INTRAVENOUS | Status: AC
  Administered 2017-09-23: 2 g via INTRAVENOUS
  Filled 2017-09-23: qty 50

## 2017-09-23 MED ORDER — CEPHALEXIN 500 MG PO CAPS
500.0000 mg | ORAL_CAPSULE | Freq: Once | ORAL | Status: AC
  Administered 2017-09-23: 500 mg via ORAL
  Filled 2017-09-23: qty 1

## 2017-09-23 MED ORDER — CEPHALEXIN 500 MG PO CAPS: 500.0000 mg | ORAL_CAPSULE | Freq: Four times a day (QID) | ORAL | 0 refills | Status: DC

## 2017-09-23 NOTE — ED Notes (Signed)
Call from lab regarding critical for pt in [place]  Triage RN informed as there are no available beds

## 2017-09-23 NOTE — ED Notes (Signed)
Pts BS now 90. Pt given chocolate and another coke.

## 2017-09-23 NOTE — ED Notes (Signed)
Pt ambulatory to waiting room. Pt verbalized understanding of discharge instructions.   

## 2017-09-23 NOTE — ED Provider Notes (Signed)
Emergency Department Provider Note   I have reviewed the triage vital signs and the nursing notes.   HISTORY  Chief Complaint Dizziness   HPI [name] is a 56 y.o. female a history of COPD, asthma, diabetes, prolonged QT, hypertension and diastolic dysfunction who presents to the emergency department today secondary to dizziness.  Patient states that she has had these symptoms before with low potassium however she has been taking her potassium incorrectly at home.  She states that she gets a room spinning situation basically every time she stands up or moves her head one way or another and it did cause vomiting one time.  She has intermittent constipation and diarrhea and abdominal pain as well.  She describes abdominal pain is been burning and seems to go from above her umbilicus all the way up through her chest and happens approximately 30 minutes after eating pretty regularly.  No urinary symptoms, no rashes, no trauma or other recent associated or modifying symptoms.    Past Medical History:  Diagnosis Date  . Asthma   . COPD (chronic obstructive pulmonary disease) ([place])   . Depression   . Diabetes mellitus   . Diastolic dysfunction 04/08/85  . Hypertension   . Prolonged QT interval 05/14/2015    Patient Active Problem List   Diagnosis Date Noted  . Diastolic dysfunction 76/19/5093  . Prolonged QT interval 05/14/2015  . Palpitations 07/23/2014  . Generalized weakness 07/23/2014  . Hypokalemia 07/22/2014  . Diabetes mellitus ([place]) 07/22/2014  . Hypertension 07/22/2014  . COPD (chronic obstructive pulmonary disease) ([place]) 07/22/2014  . Depression 07/22/2014    Past Surgical History:  Procedure Laterality Date  . CATARACT EXTRACTION    . CESAREAN SECTION    . CHOLECYSTECTOMY      Current Outpatient Rx  . Order #: [id number] Class: Historical Med  . Order #: [id number] Class: Historical Med  . Order #: [id number] Class: Historical Med  . Order #:  [id number] Class: Historical Med  . Order #: [id number] Class: Historical Med  . Order #: [id number] Class: Historical Med  . Order #: [id number] Class: Historical Med  . Order #: [id number] Class: Historical Med  . Order #: [id number] Class: Historical Med  . Order #: [id number] Class: Historical Med  . Order #: [id number] Class: Normal  . Order #: [id number] Class: Normal  . Order #: [id number] Class: Normal  . Order #: [id number] Class: Print  . Order #: [id number] Class: Historical Med  . Order #: [id number] Class: Print  . Order #: [id number] Class: Print    Allergies Benicar [olmesartan]; Codeine; Sulfa antibiotics; and Trulicity [dulaglutide]  Family History  Problem Relation Age of Onset  . Stroke Mother   . Diabetes Father   . Heart failure Father   . Hypertension Father   . Diabetes Sister   . Heart failure Sister   . Hypertension Sister   . Stroke Sister   . Cancer Other   . Stroke Sister     Social History Social History  Substance Use Topics  . Smoking status: Passive Smoke Exposure - Never Smoker  . Smokeless tobacco: Never Used  . Alcohol use No    Review of Systems  All other systems negative except as documented in the HPI. All pertinent positives and negatives as reviewed in the HPI. ____________________________________________   PHYSICAL EXAM:  VITAL SIGNS: ED Triage Vitals  Enc Vitals Group     BP 09/23/17 1559 (!) 146/68     Pulse Rate 09/23/17 1559 66     Resp 09/23/17 1559 18  Temp 09/23/17 1559 98.4 F (36.9 C)     Temp Source 09/23/17 1559 Oral     SpO2 09/23/17 1559 98 %     Weight 09/23/17 1558 280 lb (127 kg)     Height 09/23/17 1558 5\' 1"  (1.549 m)     Head Circumference --      Peak Flow --      Pain Score 09/23/17 1557 6     Pain Loc --      Pain Edu? --      Excl. in [place]? --     Constitutional: Alert and oriented. Well appearing and in no acute distress. Eyes: Conjunctivae are normal. PERRL. EOMI. Head: Atraumatic. Nose: No  congestion/rhinnorhea. Mouth/Throat: Mucous membranes are moist.  Oropharynx non-erythematous. Neck: No stridor.  No meningeal signs.   Cardiovascular: Normal rate, regular rhythm. Good peripheral circulation. Grossly normal heart sounds.   Respiratory: Normal respiratory effort.  No retractions. Lungs CTAB. Gastrointestinal: Soft and nontender. No distention.  Musculoskeletal: No lower extremity tenderness nor edema. No gross deformities of extremities. Neurologic:  Normal speech and language. No gross focal neurologic deficits are appreciated.  Skin:  Skin is warm, dry and intact. No rash noted.  ____________________________________________   LABS (all labs ordered are listed, but only abnormal results are displayed)  Labs Reviewed  BLOOD GAS, VENOUS - Abnormal; Notable for the following:       Result Value   pH, Ven 7.441 (*)    Bicarbonate 34.8 (*)    Acid-Base Excess 13.6 (*)    All other components within normal limits  CBC WITH DIFFERENTIAL/PLATELET - Abnormal; Notable for the following:    WBC 13.4 (*)    RBC 5.29 (*)    Hemoglobin 17.2 (*)    HCT 49.0 (*)    Neutro Abs 9.5 (*)    All other components within normal limits  BASIC METABOLIC PANEL - Abnormal; Notable for the following:    Potassium 2.5 (*)    Chloride 94 (*)    CO2 38 (*)    All other components within normal limits  URINALYSIS, ROUTINE W REFLEX MICROSCOPIC - Abnormal; Notable for the following:    APPearance HAZY (*)    Hgb urine dipstick SMALL (*)    Protein, ur 100 (*)    Bacteria, UA MANY (*)    Squamous Epithelial / LPF 6-30 (*)    All other components within normal limits  GLUCOSE, CAPILLARY - Abnormal; Notable for the following:    Glucose-Capillary 58 (*)    All other components within normal limits  CBG MONITORING, ED   ____________________________________________  EKG My ECG Read Indication:dizziness EKG was personally contemporaneously reviewed by myself. Rate: 64 PR Interval:  ~160 QRS duration: 104 QT/QTC: 518/585 Axis: normal EKG: normal EKG, normal sinus rhythm, unchanged from previous tracings, prolonged QT interval. Other significant findings: o change   ____________________________________________  RADIOLOGY  Ct Head Wo Contrast  Result Date: 09/23/2017 CLINICAL DATA:  Vertigo since last evening.  Hyperglycemia. EXAM: CT HEAD WITHOUT CONTRAST TECHNIQUE: Contiguous axial images were obtained from the base of the skull through the vertex without intravenous contrast. COMPARISON:  None. FINDINGS: Brain: No evidence of acute infarction, hemorrhage, hydrocephalus, extra-axial collection or mass lesion/mass effect. Minimal periventricular white matter small vessel ischemic disease. Vascular: No hyperdense vessel or unexpected calcification. Mild to moderate atherosclerotic calcifications of the cavernous sinus carotids. Skull: Normal. Negative for fracture or focal lesion. Sinuses/Orbits: No acute finding. Small mucous retention cyst of the right  maxillary sinus is noted of the right maxillary sinus, incompletely included. Other: None IMPRESSION: No acute intracranial abnormality.  Minimal small vessel ischemia. Electronically Signed   By: Ashley Royalty M.D.   On: 09/23/2017 17:30   Ct Renal Stone Study  Result Date: 09/23/2017 CLINICAL DATA:  Dizziness since last night. Intermittent generalized abdominal pain. Bilateral lower and umbilical abdominal pain. Nausea. EXAM: CT ABDOMEN AND PELVIS WITHOUT CONTRAST TECHNIQUE: Multidetector CT imaging of the abdomen and pelvis was performed following the standard protocol without IV contrast. COMPARISON:  02/10/2012 FINDINGS: Lower chest: The lung bases are clear. Small amount of residual contrast material or opaque medication in the esophagus may indicate reflux or dysmotility. Hepatobiliary: Hepatomegaly. Wedge-shaped hypodense lesions demonstrated throughout the liver. This pattern is increasing since the previous study.  Appearance is suspicious for metastatic disease although heterogeneous fatty infiltration or infarcts could also have this appearance. Suggest follow-up evaluation with elective MRI. Surgical absence of the gallbladder. No bile duct dilatation. Pancreas: Pancreas is atrophic but otherwise appears normal. Spleen: Normal in size without focal abnormality. Adrenals/Urinary Tract: No adrenal gland nodules. Kidneys are symmetrical in size. No hydronephrosis or hydroureter. Small cyst on the right kidney. No renal, ureteral, or bladder stones. Bladder wall is not thickened. Stomach/Bowel: Stomach, small bowel, and colon are mostly decompressed. Scattered stool in the colon. No inflammatory changes are suggested. Appendix is not identified. Vascular/Lymphatic: Aortic atherosclerosis. No enlarged abdominal or pelvic lymph nodes. Reproductive: Uterus and bilateral adnexa are unremarkable. Other: No abdominal wall hernia or abnormality. No abdominopelvic ascites. Musculoskeletal: No acute or significant osseous findings. IMPRESSION: 1. Hepatomegaly with wedge-shaped hypodense lesions demonstrated throughout the liver. Differential diagnosis includes metastatic disease, heterogeneous fatty infiltration, or infarcts. Suggest follow-up evaluation with elective MRI for further characterization. 2. No renal or ureteral stone or obstruction. 3. No bowel obstruction or inflammation. 4. Aortic atherosclerosis. Electronically Signed   By: Lucienne Capers M.D.   On: 09/23/2017 21:56    ____________________________________________   PROCEDURES  Procedure(s) performed:   Procedures   ____________________________________________   INITIAL IMPRESSION / ASSESSMENT AND PLAN / ED COURSE  Pertinent labs & imaging results that were available during my care of the patient were reviewed by me and considered in my medical decision making (see chart for details).  Workup consistent with hypokalemia, urinary tract infection and  likely vertigo.  Doubt central cause.  Some potassium repleted in the emergency department when he continue taking medication at home and follow-up with her primary doctor.  CT scan also showed some hypodense lesions in her liver which recommended outpatient MRI.  I put this on her paperwork and make sure she was aware as well.  Prior to discharge she had a blood sugar of 58 so was given some coca cola and we will recheck it.  ___________________________________________  FINAL CLINICAL IMPRESSION(S) / ED DIAGNOSES  Final diagnoses:  Hypokalemia  Vertigo  Urinary tract infection without hematuria, site unspecified     MEDICATIONS GIVEN DURING THIS VISIT:  Medications  potassium chloride 10 mEq in 100 mL IVPB (0 mEq Intravenous Stopped 09/23/17 2257)  meclizine (ANTIVERT) tablet 12.5 mg (12.5 mg Oral Given 09/23/17 2042)  potassium chloride SA (K-DUR,KLOR-CON) CR tablet 40 mEq (40 mEq Oral Given 09/23/17 2042)  magnesium sulfate IVPB 2 g 50 mL (0 g Intravenous Stopped 09/23/17 2155)  cephALEXin (KEFLEX) capsule 500 mg (500 mg Oral Given 09/23/17 2324)     NEW OUTPATIENT MEDICATIONS STARTED DURING THIS VISIT:  New Prescriptions   CEPHALEXIN (KEFLEX)  500 MG CAPSULE    Take 1 capsule (500 mg total) by mouth 4 (four) times daily.   MECLIZINE (ANTIVERT) 12.5 MG TABLET    Take 1 tablet (12.5 mg total) by mouth 3 (three) times daily as needed for dizziness.    Note:  This document was prepared using Dragon voice recognition software and may include unintentional dictation errors.   Merrily Pew, MD 09/23/17 2337

## 2017-09-23 NOTE — ED Notes (Signed)
Went in to discharge patient and she stated she felt kind of "jittery" and states she feels that way when her sugar has dropped; cbg checked and is 58, pt given coke and Dr. Dayna Barker notified

## 2017-09-23 NOTE — ED Triage Notes (Signed)
Patient complaining of dizziness since last night at 1900. States her sugar is 547.

## 2017-09-26 ENCOUNTER — Ambulatory Visit: Payer: PRIVATE HEALTH INSURANCE | Admitting: Obstetrics & Gynecology

## 2017-09-26 ENCOUNTER — Other Ambulatory Visit (HOSPITAL_COMMUNITY): Payer: Self-pay | Admitting: Physician Assistant

## 2017-09-26 ENCOUNTER — Other Ambulatory Visit: Payer: PRIVATE HEALTH INSURANCE

## 2017-09-26 DIAGNOSIS — K769 Liver disease, unspecified: Secondary | ICD-10-CM

## 2017-10-04 ENCOUNTER — Telehealth: Payer: Self-pay | Admitting: *Deleted

## 2017-10-07 ENCOUNTER — Ambulatory Visit (HOSPITAL_COMMUNITY): Payer: PRIVATE HEALTH INSURANCE

## 2017-10-11 ENCOUNTER — Ambulatory Visit: Payer: PRIVATE HEALTH INSURANCE | Admitting: "Endocrinology

## 2017-10-15 ENCOUNTER — Ambulatory Visit (HOSPITAL_COMMUNITY): Admission: RE | Admit: 2017-10-15 | Payer: PRIVATE HEALTH INSURANCE | Source: Ambulatory Visit

## 2017-11-30 ENCOUNTER — Emergency Department (HOSPITAL_COMMUNITY)
Admission: EM | Admit: 2017-11-30 | Discharge: 2017-11-30 | Disposition: A | Discharge status: Home / Self Care | Payer: PRIVATE HEALTH INSURANCE | Attending: Emergency Medicine | Admitting: Emergency Medicine

## 2017-11-30 ENCOUNTER — Encounter (HOSPITAL_COMMUNITY): Payer: Self-pay | Admitting: Emergency Medicine

## 2017-11-30 ENCOUNTER — Other Ambulatory Visit: Payer: Self-pay

## 2017-11-30 DIAGNOSIS — R42 Dizziness and giddiness: Secondary | ICD-10-CM

## 2017-11-30 DIAGNOSIS — I10 Essential (primary) hypertension: Secondary | ICD-10-CM | POA: Insufficient documentation

## 2017-11-30 DIAGNOSIS — E876 Hypokalemia: Secondary | ICD-10-CM

## 2017-11-30 DIAGNOSIS — Z794 Long term (current) use of insulin: Secondary | ICD-10-CM | POA: Insufficient documentation

## 2017-11-30 DIAGNOSIS — Z7722 Contact with and (suspected) exposure to environmental tobacco smoke (acute) (chronic): Secondary | ICD-10-CM | POA: Insufficient documentation

## 2017-11-30 DIAGNOSIS — E119 Type 2 diabetes mellitus without complications: Secondary | ICD-10-CM | POA: Insufficient documentation

## 2017-11-30 DIAGNOSIS — Z79899 Other long term (current) drug therapy: Secondary | ICD-10-CM | POA: Insufficient documentation

## 2017-11-30 DIAGNOSIS — J449 Chronic obstructive pulmonary disease, unspecified: Secondary | ICD-10-CM | POA: Insufficient documentation

## 2017-11-30 LAB — COMPREHENSIVE METABOLIC PANEL
ALT: 23 U/L (ref 14–54)
AST: 32 U/L (ref 15–41)
Albumin: 3.6 g/dL (ref 3.5–5.0)
Alkaline Phosphatase: 159 U/L — ABNORMAL HIGH (ref 38–126)
Anion gap: 13 (ref 5–15)
BUN: 16 mg/dL (ref 6–20)
CO2: 31 mmol/L (ref 22–32)
Calcium: 9.3 mg/dL (ref 8.9–10.3)
Chloride: 95 mmol/L — ABNORMAL LOW (ref 101–111)
Creatinine, Ser: 1.01 mg/dL — ABNORMAL HIGH (ref 0.44–1.00)
GFR calc Af Amer: >60 mL/min (ref >60)
GFR calc non Af Amer: >60 mL/min (ref >60)
Glucose, Bld: 152 mg/dL — ABNORMAL HIGH (ref 65–99)
Potassium: 2.6 mmol/L — CL (ref 3.5–5.1)
Sodium: 139 mmol/L (ref 135–145)
Total Bilirubin: 0.6 mg/dL (ref 0.3–1.2)
Total Protein: 7.8 g/dL (ref 6.5–8.1)

## 2017-11-30 LAB — CBC WITH DIFFERENTIAL/PLATELET
Basophils Absolute: 0 10*3/uL (ref 0.0–0.1)
Basophils Relative: 0 %
Eosinophils Absolute: 0.4 10*3/uL (ref 0.0–0.7)
Eosinophils Relative: 4 %
HCT: 46.9 % — ABNORMAL HIGH (ref 36.0–46.0)
Hemoglobin: 15.7 g/dL — ABNORMAL HIGH (ref 12.0–15.0)
Lymphocytes Relative: 18 %
Lymphs Abs: 1.9 10*3/uL (ref 0.7–4.0)
MCH: 31.6 pg (ref 26.0–34.0)
MCHC: 33.5 g/dL (ref 30.0–36.0)
MCV: 94.4 fL (ref 78.0–100.0)
Monocytes Absolute: 0.7 10*3/uL (ref 0.1–1.0)
Monocytes Relative: 7 %
Neutro Abs: 7.1 10*3/uL (ref 1.7–7.7)
Neutrophils Relative %: 71 %
Platelets: 343 10*3/uL (ref 150–400)
RBC: 4.97 MIL/uL (ref 3.87–5.11)
RDW: 13.1 % (ref 11.5–15.5)
WBC: 10.1 10*3/uL (ref 4.0–10.5)

## 2017-11-30 LAB — CBG MONITORING, ED: Glucose-Capillary: 164 mg/dL — ABNORMAL HIGH (ref 65–99)

## 2017-11-30 LAB — MAGNESIUM: Magnesium: 1.8 mg/dL (ref 1.7–2.4)

## 2017-11-30 MED ORDER — MECLIZINE HCL 12.5 MG PO TABS
25.0000 mg | ORAL_TABLET | Freq: Once | ORAL | Status: AC
  Administered 2017-11-30: 25 mg via ORAL
  Filled 2017-11-30: qty 2

## 2017-11-30 MED ORDER — POTASSIUM CHLORIDE CRYS ER 20 MEQ PO TBCR
40.0000 meq | EXTENDED_RELEASE_TABLET | Freq: Once | ORAL | Status: AC
  Administered 2017-11-30: 40 meq via ORAL
  Filled 2017-11-30: qty 2

## 2017-11-30 MED ORDER — POTASSIUM CHLORIDE ER 20 MEQ PO TBCR: EXTENDED_RELEASE_TABLET | ORAL | 0 refills | Status: DC

## 2017-11-30 MED ORDER — MECLIZINE HCL 25 MG PO TABS: 25.0000 mg | ORAL_TABLET | Freq: Four times a day (QID) | ORAL | 0 refills | Status: DC | PRN

## 2017-11-30 MED ORDER — SODIUM CHLORIDE 0.9 % IV BOLUS (SEPSIS)
500.0000 mL | Freq: Once | INTRAVENOUS | Status: AC
  Administered 2017-11-30: 500 mL via INTRAVENOUS

## 2017-11-30 MED ORDER — SODIUM CHLORIDE 0.9 % IV BOLUS (SEPSIS)
1000.0000 mL | Freq: Once | INTRAVENOUS | Status: AC
  Administered 2017-11-30: 1000 mL via INTRAVENOUS

## 2017-11-30 MED ORDER — PROCHLORPERAZINE 25 MG RE SUPP: 25.0000 mg | Freq: Two times a day (BID) | RECTAL | 0 refills | Status: DC | PRN

## 2017-11-30 MED ORDER — PROCHLORPERAZINE EDISYLATE 5 MG/ML IJ SOLN
10.0000 mg | Freq: Once | INTRAMUSCULAR | Status: AC
  Administered 2017-11-30: 10 mg via INTRAVENOUS
  Filled 2017-11-30: qty 2

## 2017-11-30 NOTE — ED Triage Notes (Signed)
Dizziness and vomiting x 2 hours.  Pt reports vomiting x 4.  Pt was sticking her finger down her throat when first brought to room.  Pt states she was trying to make herself throw up to feel better.  bg was 425 at 2130.

## 2017-11-30 NOTE — ED Notes (Signed)
CRITICAL VALUE ALERT  Critical Value:  Potassium 2.6   Date & Time Notied:  11/30/17   Provider Notified: Tomi Bamberger, EDP   Orders Received/Actions taken: No orders at this time

## 2017-11-30 NOTE — Discharge Instructions (Signed)
Take the medication as prescribed.  I gave you nausea suppositories because if you are vomiting you will not be able to take pills.  Take the meclizine for the dizziness and feeling of being off balance.  Please be careful until you are sure the symptoms are gone for several days.  You could easily fall and hurt yourself so make sure you hold onto handrails when going up and down stairs.  Please do not be afraid to ask for help until you are sure this is over.  Do not drive until you are sure this is over for several days.  Your potassium has been very low the last several times you have been to the ED.  This can cause several muscle related problems including your heart.  Please have your doctor recheck your potassium level in the next 1-2 weeks to make sure it is corrected.  You might consider taking some magnesium pills over-the-counter, your magnesium level was in the normal range but it was on the low end of normal.  Recheck if you get worse again.

## 2017-11-30 NOTE — ED Provider Notes (Signed)
[facility] EMERGENCY DEPARTMENT Provider Note   CSN: [id number] Arrival date & time: 11/30/17  0135  Time seen 2:00  AM   History   Chief Complaint Chief Complaint  Patient presents with  . Dizziness    HPI [name] is a 56 y.o. female.  HPI patient states she has had a headache all day today, she has pain in her bilateral temples and sometimes in her eyes.  She states she gets headaches weekly.  This is like headaches she has had before.  She states she has chronic numbness and tingling of her extremities from neuropathy and it is unchanged.  She states about 10:30 PM she started getting dizzy and states she feels like things are moving but not spinning, and when she walks she feels like she is going to the right.  She has had nausea and vomiting about 4 times.  She denies any visual changes.  She states any movement of her head makes it feel worse.  She states she had this before about 6-8 weeks ago and was diagnosed with vertigo.  She states it lasted about 24 hours.  She states this feels the same.  Patient was seen by her doctor yesterday and was diagnosed with a UTI.  She states she was treated with a antibiotic that starts with a F.  PCP Dione Housekeeper, MD   Past Medical History:  Diagnosis Date  . Asthma   . COPD (chronic obstructive pulmonary disease) ([place])   . Depression   . Diabetes mellitus   . Diastolic dysfunction 12/08/2438  . Hypertension   . Prolonged QT interval 05/14/2015    Patient Active Problem List   Diagnosis Date Noted  . Diastolic dysfunction 10/02/2535  . Prolonged QT interval 05/14/2015  . Palpitations 07/23/2014  . Generalized weakness 07/23/2014  . Hypokalemia 07/22/2014  . Diabetes mellitus ([place]) 07/22/2014  . Hypertension 07/22/2014  . COPD (chronic obstructive pulmonary disease) ([place]) 07/22/2014  . Depression 07/22/2014    Past Surgical History:  Procedure Laterality Date  . CATARACT EXTRACTION    . CESAREAN SECTION    .  CHOLECYSTECTOMY      OB History    Gravida Para Term Preterm AB Living   3 2 2   1 2    SAB TAB Ectopic Multiple Live Births   1               Home Medications    Prior to Admission medications   Medication Sig Start Date End Date Taking? Authorizing Provider  albuterol (PROAIR HFA) 108 (90 BASE) MCG/ACT inhaler Inhale 2 puffs into the lungs every 6 (six) hours as needed for wheezing or shortness of breath.    [provider]  amLODipine (NORVASC) 5 MG tablet Take 5 mg by mouth daily.  12/31/15   [provider]  atorvastatin (LIPITOR) 20 MG tablet Take 20 mg by mouth daily. 03/27/16   [provider]  cephALEXin (KEFLEX) 500 MG capsule Take 1 capsule (500 mg total) by mouth 4 (four) times daily. 09/23/17   Mesner, Corene Cornea, MD  DULoxetine (CYMBALTA) 60 MG capsule Take 60 mg by mouth at bedtime. 09/14/17   [provider]  escitalopram (LEXAPRO) 20 MG tablet Take 20 mg by mouth every evening. 02/14/17   [provider]  esomeprazole (NEXIUM) 20 MG capsule Take 20 mg by mouth daily at 12 noon.    [provider]  fluticasone furoate-vilanterol (BREO ELLIPTA) 200-25 MCG/INH AEPB Inhale 1 puff into  the lungs daily.    [provider]  insulin regular human CONCENTRATED (HUMULIN R U-500 KWIKPEN) 500 UNIT/ML kwikpen Inject 120-240 Units into the skin 2 (two) times daily with a meal. 240 units before breakfast, and 120 units before supper    [provider]  LORazepam (ATIVAN) 1 MG tablet Take 1 mg by mouth at bedtime.  10/08/15   [provider]  meclizine (ANTIVERT) 25 MG tablet Take 1 tablet (25 mg total) by mouth 4 (four) times daily as needed for dizziness. 11/30/17   Rolland Porter, MD  metolazone (ZAROXOLYN) 5 MG tablet Take 5 mg by mouth daily as needed (for swelling).  01/10/17   [provider]  potassium chloride 20 MEQ TBCR Take 2 twice a day for the next 7 days and than 1 po QD 11/30/17   Rolland Porter, MD    potassium chloride SA (K-DUR,KLOR-CON) 20 MEQ tablet Take 2 tablets (40 mEq total) by mouth 2 (two) times daily. 03/21/17 03/24/17  Long, Wonda Olds, MD  prochlorperazine (COMPAZINE) 25 MG suppository Place 1 suppository (25 mg total) rectally every 12 (twelve) hours as needed for nausea or vomiting. 11/30/17   Rolland Porter, MD  propranolol ER (INDERAL LA) 120 MG 24 hr capsule Take 120 mg by mouth at bedtime.     [provider]  silver sulfADIAZINE (SILVADENE) 1 % cream Apply 1 application topically daily. 09/09/17   Florian Buff, MD  spironolactone (ALDACTONE) 25 MG tablet Take 1 tablet (25 mg total) by mouth daily. 05/16/15   Samuella Cota, MD  torsemide (DEMADEX) 20 MG tablet Take 1 tablet (20 mg total) by mouth 2 (two) times daily. 05/16/15   Samuella Cota, MD    Family History Family History  Problem Relation Age of Onset  . Stroke Mother   . Diabetes Father   . Heart failure Father   . Hypertension Father   . Diabetes Sister   . Heart failure Sister   . Hypertension Sister   . Stroke Sister   . Cancer Other   . Stroke Sister     Social History Social History   Tobacco Use  . Smoking status: Passive Smoke Exposure - Never Smoker  . Smokeless tobacco: Never Used  Substance Use Topics  . Alcohol use: No    Alcohol/week: 0.0 oz  . Drug use: No     Allergies   Benicar [olmesartan]; Codeine; Sulfa antibiotics; and Trulicity [dulaglutide]   Review of Systems Review of Systems   Physical Exam Updated Vital Signs BP (!) 148/70 (BP Location: Left Arm)   Pulse (!) 58   Temp 97.7 F (36.5 C) (Oral)   Resp 18   Ht 5\' 1"  (1.549 m)   Wt 130.2 kg (287 lb)   LMP 08/06/2012   SpO2 94%   BMI 54.23 kg/m   Vital signs normal    Physical Exam Vital signs noted Patient is alert and cooperative and when she moves her head you can tell that it makes her feel bad. Pupils are equal reactive to light extraocular muscles are intact.  When I examine her with her  holding her head still there is no nystagmus There is no head trauma noted, her nose is normal without drainage, her oropharynx is benign except that her mucous membranes are dry.  External ears are normal Neck is supple Lungs are clear without wheezes rhonchi rales Cardiovascular S1-S2 is normal there is no heart murmur heard Skin skin is warm and  dry without erythema or rash Neuro there is no cranial nerve deficit, patient is alert and oriented x4. Muscular skeletal she moves all extremities well, there is no focal weakness. Psychiatric patient appears to have normal affect, she does not appear to have any hallucinations or delusions.  ED Treatments / Results  Labs (all labs ordered are listed, but only abnormal results are displayed)  Results for orders placed or performed during the hospital encounter of 11/30/17  Comprehensive metabolic panel  Result Value Ref Range   Sodium 139 135 - 145 mmol/L   Potassium 2.6 (LL) 3.5 - 5.1 mmol/L   Chloride 95 (L) 101 - 111 mmol/L   CO2 31 22 - 32 mmol/L   Glucose, Bld 152 (H) 65 - 99 mg/dL   BUN 16 6 - 20 mg/dL   Creatinine, Ser 1.01 (H) 0.44 - 1.00 mg/dL   Calcium 9.3 8.9 - 10.3 mg/dL   Total Protein 7.8 6.5 - 8.1 g/dL   Albumin 3.6 3.5 - 5.0 g/dL   AST 32 15 - 41 U/L   ALT 23 14 - 54 U/L   Alkaline Phosphatase 159 (H) 38 - 126 U/L   Total Bilirubin 0.6 0.3 - 1.2 mg/dL   GFR calc non Af Amer >60 >60 mL/min   GFR calc Af Amer >60 >60 mL/min   Anion gap 13 5 - 15  CBC with Differential  Result Value Ref Range   WBC 10.1 4.0 - 10.5 K/uL   RBC 4.97 3.87 - 5.11 MIL/uL   Hemoglobin 15.7 (H) 12.0 - 15.0 g/dL   HCT 46.9 (H) 36.0 - 46.0 %   MCV 94.4 78.0 - 100.0 fL   MCH 31.6 26.0 - 34.0 pg   MCHC 33.5 30.0 - 36.0 g/dL   RDW 13.1 11.5 - 15.5 %   Platelets 343 150 - 400 K/uL   Neutrophils Relative % 71 %   Neutro Abs 7.1 1.7 - 7.7 K/uL   Lymphocytes Relative 18 %   Lymphs Abs 1.9 0.7 - 4.0 K/uL   Monocytes Relative 7 %   Monocytes  Absolute 0.7 0.1 - 1.0 K/uL   Eosinophils Relative 4 %   Eosinophils Absolute 0.4 0.0 - 0.7 K/uL   Basophils Relative 0 %   Basophils Absolute 0.0 0.0 - 0.1 K/uL  Magnesium  Result Value Ref Range   Magnesium 1.8 1.7 - 2.4 mg/dL  CBG monitoring, ED  Result Value Ref Range   Glucose-Capillary 164 (H) 65 - 99 mg/dL   Laboratory interpretation all normal except hypokalemia, mild anemia   EKG  EKG Interpretation  Date/Time:  Wednesday November 30 2017 01:55:13 EST Ventricular Rate:  56 PR Interval:    QRS Duration: 106 QT Interval:  509 QTC Calculation: 492 R Axis:   -23 Text Interpretation:  Sinus bradycardia Low voltage, precordial leads Probable left ventricular hypertrophy Borderline prolonged QT interval Baseline wander No significant change since last tracing 23 Sep 2017 Confirmed by Rolland Porter [phone]) on 11/30/2017 2:00:27 AM       Radiology No results found.  Procedures Procedures (including critical care time)  Medications Ordered in ED Medications  sodium chloride 0.9 % bolus 1,000 mL (0 mLs Intravenous Stopped 11/30/17 0342)  sodium chloride 0.9 % bolus 500 mL (0 mLs Intravenous Stopped 11/30/17 0305)  prochlorperazine (COMPAZINE) injection 10 mg (10 mg Intravenous Given 11/30/17 0235)  meclizine (ANTIVERT) tablet 25 mg (25 mg Oral Given 11/30/17 0235)  potassium chloride SA (K-DUR,KLOR-CON) CR tablet 40 mEq (40  mEq Oral Given 11/30/17 0420)     Initial Impression / Assessment and Plan / ED Course  I have reviewed the triage vital signs and the nursing notes.  Pertinent labs & imaging results that were available during my care of the patient were reviewed by me and considered in my medical decision making (see chart for details).     Review of care everywhere shows patient was discharged on Macrobid also called nitrofurantoin, 100 mg twice a day.  Patient was given Compazine which is safe with prolonged QT interval for her nausea, and meclizine for her  dizziness.  Laboratory testing was done.  Recheck at 4 AM patient is feeling much better, she is smiling now, she is moving her head freely during course of normal conversation.  We discussed her potassium results.  She states she is able now to take it orally.  She states she has been on potassium pills before.  Magnesium level was added to her blood work.  Recheck at 5:15 AM patient states she feels much improved.  We discussed her magnesium was normal but on the low end of normal.  We also discussed her potassium level has been very low the last 3 or 4 ER visits she has had.  She was given potassium supplement to take at home and strongly advised to follow-up with her doctor to make sure it corrects itself.  She is on 2 diuretics, one is a potassium sparing and the other is not.  This is most likely the etiology of her hypokalemia.  We also discussed being careful with the vertigo because she could easily get injured, so she should be very careful going up and down stairs and she should not drive until her symptoms are gone for a few days.  She was encouraged to ask for help until she knows she is improved.   Final Clinical Impressions(s) / ED Diagnoses   Final diagnoses:  Vertigo  Hypokalemia    ED Discharge Orders        Ordered    prochlorperazine (COMPAZINE) 25 MG suppository  Every 12 hours PRN     11/30/17 0522    meclizine (ANTIVERT) 25 MG tablet  4 times daily PRN     11/30/17 0522    potassium chloride 20 MEQ TBCR     11/30/17 0522      Plan discharge  Rolland Porter, MD, Barbette Or, MD 11/30/17 [phone]

## 2017-12-21 ENCOUNTER — Other Ambulatory Visit: Payer: Self-pay

## 2017-12-21 ENCOUNTER — Emergency Department (HOSPITAL_COMMUNITY)
Admission: EM | Admit: 2017-12-21 | Discharge: 2017-12-22 | Disposition: A | Discharge status: Home / Self Care | Payer: PRIVATE HEALTH INSURANCE | Attending: Emergency Medicine | Admitting: Emergency Medicine

## 2017-12-21 ENCOUNTER — Encounter (HOSPITAL_COMMUNITY): Payer: Self-pay | Admitting: *Deleted

## 2017-12-21 ENCOUNTER — Emergency Department (HOSPITAL_COMMUNITY): Payer: PRIVATE HEALTH INSURANCE

## 2017-12-21 DIAGNOSIS — J449 Chronic obstructive pulmonary disease, unspecified: Secondary | ICD-10-CM | POA: Insufficient documentation

## 2017-12-21 DIAGNOSIS — I509 Heart failure, unspecified: Secondary | ICD-10-CM | POA: Insufficient documentation

## 2017-12-21 DIAGNOSIS — I110 Hypertensive heart disease with heart failure: Secondary | ICD-10-CM | POA: Insufficient documentation

## 2017-12-21 DIAGNOSIS — J45909 Unspecified asthma, uncomplicated: Secondary | ICD-10-CM | POA: Insufficient documentation

## 2017-12-21 DIAGNOSIS — Z79899 Other long term (current) drug therapy: Secondary | ICD-10-CM | POA: Insufficient documentation

## 2017-12-21 DIAGNOSIS — Z794 Long term (current) use of insulin: Secondary | ICD-10-CM | POA: Insufficient documentation

## 2017-12-21 DIAGNOSIS — I5033 Acute on chronic diastolic (congestive) heart failure: Secondary | ICD-10-CM

## 2017-12-21 DIAGNOSIS — E1165 Type 2 diabetes mellitus with hyperglycemia: Secondary | ICD-10-CM | POA: Insufficient documentation

## 2017-12-21 DIAGNOSIS — R739 Hyperglycemia, unspecified: Secondary | ICD-10-CM

## 2017-12-21 DIAGNOSIS — Z7722 Contact with and (suspected) exposure to environmental tobacco smoke (acute) (chronic): Secondary | ICD-10-CM | POA: Insufficient documentation

## 2017-12-21 LAB — BASIC METABOLIC PANEL
Anion gap: 14 (ref 5–15)
BUN: 13 mg/dL (ref 6–20)
CO2: 26 mmol/L (ref 22–32)
Calcium: 9 mg/dL (ref 8.9–10.3)
Chloride: 98 mmol/L — ABNORMAL LOW (ref 101–111)
Creatinine, Ser: 0.95 mg/dL (ref 0.44–1.00)
GFR calc Af Amer: >60 mL/min (ref >60)
GFR calc non Af Amer: >60 mL/min (ref >60)
Glucose, Bld: 373 mg/dL — ABNORMAL HIGH (ref 65–99)
Potassium: 4.3 mmol/L (ref 3.5–5.1)
Sodium: 138 mmol/L (ref 135–145)

## 2017-12-21 LAB — CBC WITH DIFFERENTIAL/PLATELET
Basophils Absolute: 0 10*3/uL (ref 0.0–0.1)
Basophils Relative: 0 %
Eosinophils Absolute: 0.2 10*3/uL (ref 0.0–0.7)
Eosinophils Relative: 1 %
HCT: 49.6 % — ABNORMAL HIGH (ref 36.0–46.0)
Hemoglobin: 16.6 g/dL — ABNORMAL HIGH (ref 12.0–15.0)
Lymphocytes Relative: 5 %
Lymphs Abs: 1 10*3/uL (ref 0.7–4.0)
MCH: 31.6 pg (ref 26.0–34.0)
MCHC: 33.5 g/dL (ref 30.0–36.0)
MCV: 94.5 fL (ref 78.0–100.0)
Monocytes Absolute: 0.7 10*3/uL (ref 0.1–1.0)
Monocytes Relative: 4 %
Neutro Abs: 16.1 10*3/uL — ABNORMAL HIGH (ref 1.7–7.7)
Neutrophils Relative %: 90 %
Platelets: 282 10*3/uL (ref 150–400)
RBC: 5.25 MIL/uL — ABNORMAL HIGH (ref 3.87–5.11)
RDW: 13.1 % (ref 11.5–15.5)
WBC: 18 10*3/uL — ABNORMAL HIGH (ref 4.0–10.5)

## 2017-12-21 LAB — D-DIMER, QUANTITATIVE: D-Dimer, Quant: 0.38 ug/mL-FEU (ref 0.00–0.50)

## 2017-12-21 LAB — BRAIN NATRIURETIC PEPTIDE: B Natriuretic Peptide: 79 pg/mL (ref 0.0–100.0)

## 2017-12-21 LAB — TROPONIN I: Troponin I: <0.03 ng/mL (ref <0.03)

## 2017-12-21 IMAGING — DX DG CHEST 2V
2 series · 2 of 2 positions shown · non-contrast
Comparison: Chest x-ray dated [DATE].

CLINICAL DATA: Shortness of breath and back pain.

EXAM:
CHEST  2 VIEW

[chest pa]
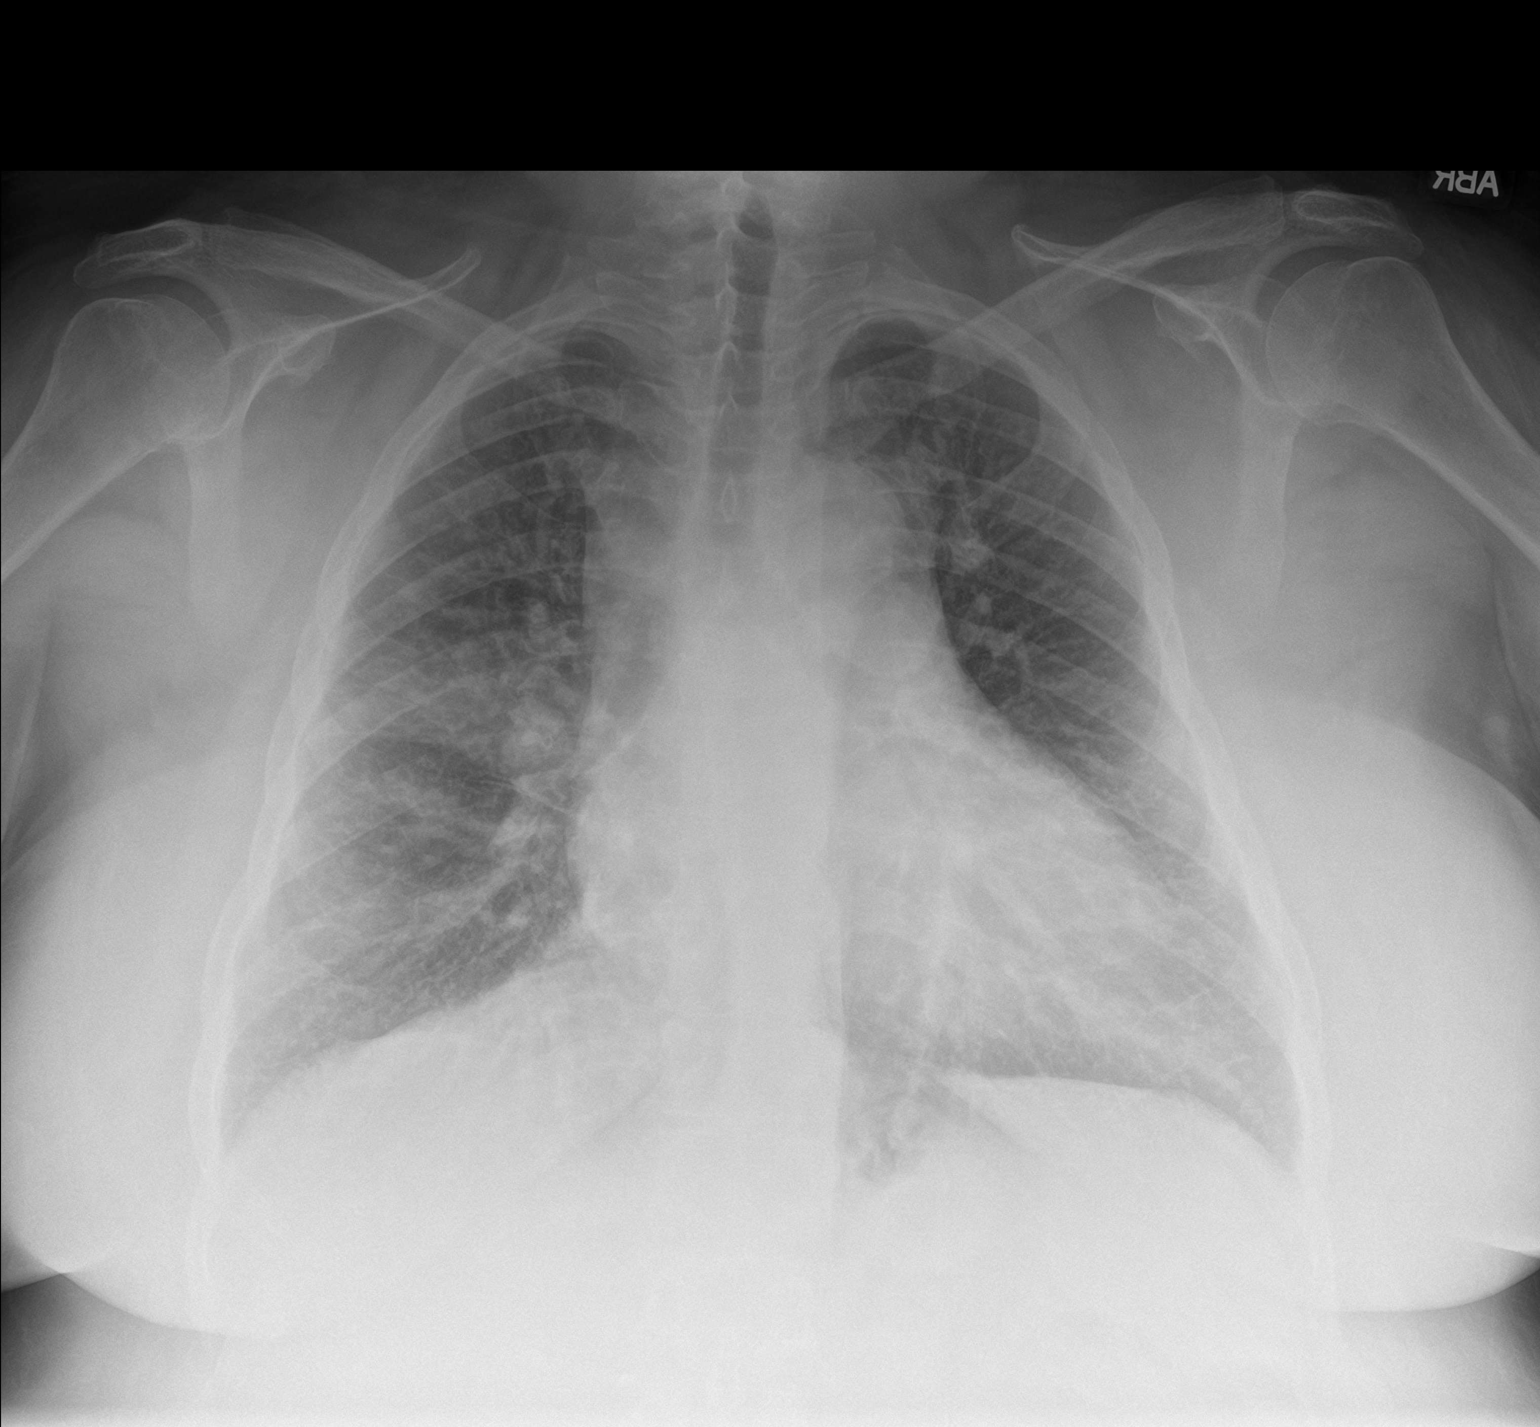

[chest lat]
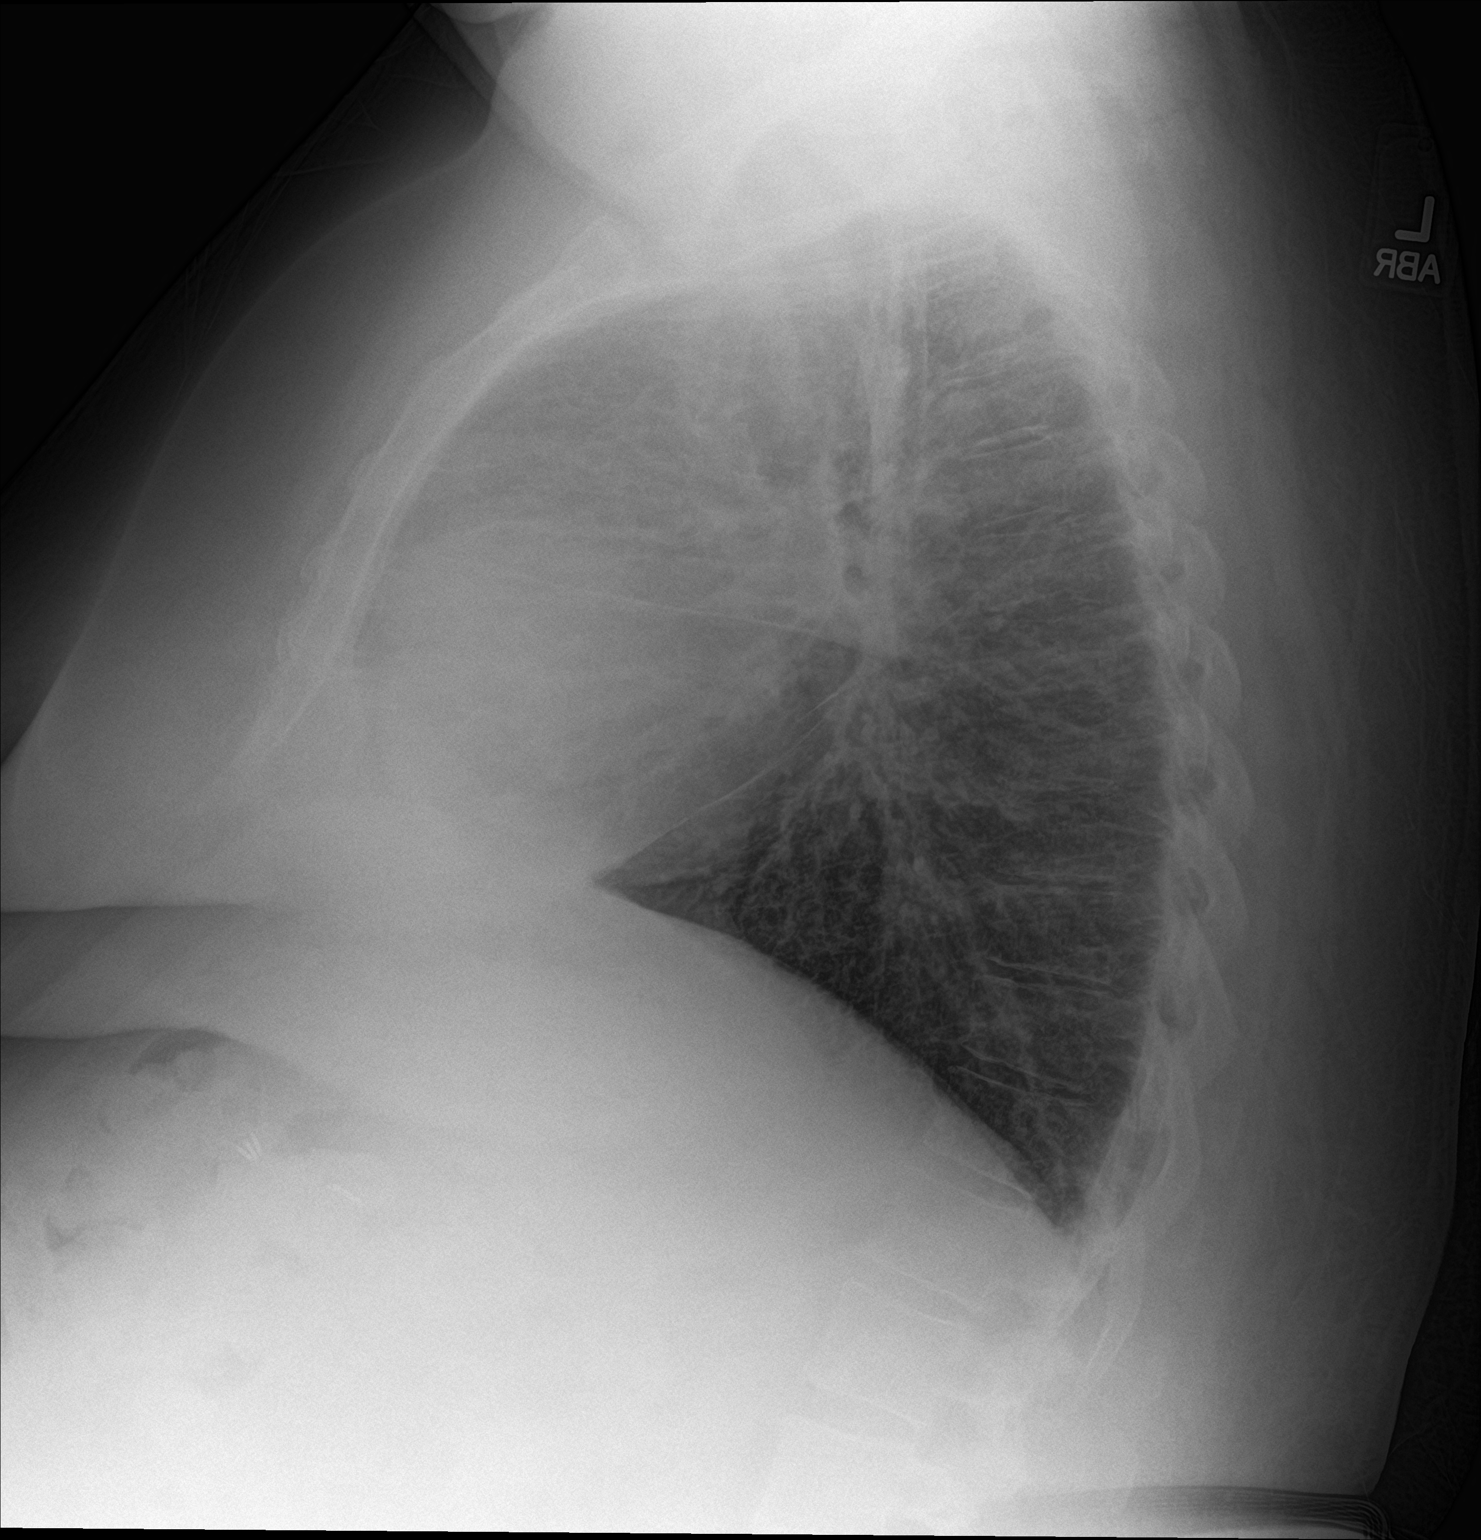

[2 of 2 positions shown; findings below may reference images not displayed]

FINDINGS: The cardiac silhouette is mildly enlarged. Central peribronchial
thickening and lower lobe predominant interstitial thickening. No
focal consolidation, pleural effusion, or pneumothorax. No acute
osseous abnormality.
IMPRESSION: Mild cardiomegaly and interstitial pulmonary edema.

## 2017-12-21 MED ORDER — FUROSEMIDE 10 MG/ML IJ SOLN
80.0000 mg | Freq: Once | INTRAMUSCULAR | Status: AC
  Administered 2017-12-22: 80 mg via INTRAVENOUS
  Filled 2017-12-21: qty 8

## 2017-12-21 NOTE — ED Provider Notes (Signed)
[facility] EMERGENCY DEPARTMENT Provider Note   CSN: [id number] Arrival date & time: 12/21/17  1726     History   Chief Complaint Chief Complaint  Patient presents with  . Shortness of Breath    HPI [name] is a 57 y.o. female who presents with SOB. PMH significant for asthma/COPD, morbid obesity, DM, HTN, CHF EF 60-65%. She states that she has been SOB for the past day. Lying flat and exertion makes it worse. She also has had URI symptoms - she reports a headache, chills, nasal congestion/rhinorrhea, productive cough. She has sharp substernal chest pain which is worse with coughing and deep breaths. She also has some epigastric pain, N/V/D. She has been using her inhalers at home without relief. She denies significant weight gain and has been taking her medicines but has missed a couple doses. She is currently being treated for a UTI and has urinary frequency.  HPI  Past Medical History:  Diagnosis Date  . Asthma   . COPD (chronic obstructive pulmonary disease) ([place])   . Depression   . Diabetes mellitus   . Diastolic dysfunction 08/10/6212  . Hypertension   . Prolonged QT interval 05/14/2015    Patient Active Problem List   Diagnosis Date Noted  . Diastolic dysfunction 08/65/7846  . Prolonged QT interval 05/14/2015  . Palpitations 07/23/2014  . Generalized weakness 07/23/2014  . Hypokalemia 07/22/2014  . Diabetes mellitus ([place]) 07/22/2014  . Hypertension 07/22/2014  . COPD (chronic obstructive pulmonary disease) ([place]) 07/22/2014  . Depression 07/22/2014    Past Surgical History:  Procedure Laterality Date  . CATARACT EXTRACTION    . CESAREAN SECTION    . CHOLECYSTECTOMY      OB History    Gravida Para Term Preterm AB Living   3 2 2   1 2    SAB TAB Ectopic Multiple Live Births   1               Home Medications    Prior to Admission medications   Medication Sig Start Date End Date Taking? Authorizing Provider  albuterol (PROAIR HFA) 108 (90  BASE) MCG/ACT inhaler Inhale 2 puffs into the lungs every 6 (six) hours as needed for wheezing or shortness of breath.    [provider]  amLODipine (NORVASC) 5 MG tablet Take 5 mg by mouth daily.  12/31/15   [provider]  atorvastatin (LIPITOR) 20 MG tablet Take 20 mg by mouth daily. 03/27/16   [provider]  cephALEXin (KEFLEX) 500 MG capsule Take 1 capsule (500 mg total) by mouth 4 (four) times daily. 09/23/17   Mesner, Corene Cornea, MD  DULoxetine (CYMBALTA) 60 MG capsule Take 60 mg by mouth at bedtime. 09/14/17   [provider]  escitalopram (LEXAPRO) 20 MG tablet Take 20 mg by mouth every evening. 02/14/17   [provider]  esomeprazole (NEXIUM) 20 MG capsule Take 20 mg by mouth daily at 12 noon.    [provider]  fluticasone furoate-vilanterol (BREO ELLIPTA) 200-25 MCG/INH AEPB Inhale 1 puff into the lungs daily.    [provider]  insulin regular human CONCENTRATED (HUMULIN R U-500 KWIKPEN) 500 UNIT/ML kwikpen Inject 120-240 Units into the skin 2 (two) times daily with a meal. 240 units before breakfast, and 120 units before supper    [provider]  LORazepam (ATIVAN) 1 MG tablet Take 1 mg by mouth at bedtime.  10/08/15   [provider]  meclizine (ANTIVERT) 25 MG tablet Take  1 tablet (25 mg total) by mouth 4 (four) times daily as needed for dizziness. 11/30/17   Rolland Porter, MD  metolazone (ZAROXOLYN) 5 MG tablet Take 5 mg by mouth daily as needed (for swelling).  01/10/17   [provider]  potassium chloride 20 MEQ TBCR Take 2 twice a day for the next 7 days and than 1 po QD 11/30/17   Rolland Porter, MD  potassium chloride SA (K-DUR,KLOR-CON) 20 MEQ tablet Take 2 tablets (40 mEq total) by mouth 2 (two) times daily. 03/21/17 03/24/17  Long, Wonda Olds, MD  prochlorperazine (COMPAZINE) 25 MG suppository Place 1 suppository (25 mg total) rectally every 12 (twelve) hours as needed for nausea or vomiting. 11/30/17    Rolland Porter, MD  propranolol ER (INDERAL LA) 120 MG 24 hr capsule Take 120 mg by mouth at bedtime.     [provider]  silver sulfADIAZINE (SILVADENE) 1 % cream Apply 1 application topically daily. 09/09/17   Florian Buff, MD  spironolactone (ALDACTONE) 25 MG tablet Take 1 tablet (25 mg total) by mouth daily. 05/16/15   Samuella Cota, MD  torsemide (DEMADEX) 20 MG tablet Take 1 tablet (20 mg total) by mouth 2 (two) times daily. 05/16/15   Samuella Cota, MD    Family History Family History  Problem Relation Age of Onset  . Stroke Mother   . Diabetes Father   . Heart failure Father   . Hypertension Father   . Diabetes Sister   . Heart failure Sister   . Hypertension Sister   . Stroke Sister   . Cancer Other   . Stroke Sister     Social History Social History   Tobacco Use  . Smoking status: Passive Smoke Exposure - Never Smoker  . Smokeless tobacco: Never Used  Substance Use Topics  . Alcohol use: No    Alcohol/week: 0.0 oz  . Drug use: No     Allergies   Benicar [olmesartan]; Codeine; Sulfa antibiotics; and Trulicity [dulaglutide]   Review of Systems Review of Systems  Constitutional: Positive for chills. Negative for appetite change and fever.  HENT: Positive for congestion, rhinorrhea and sore throat. Negative for ear pain.   Respiratory: Positive for cough, shortness of breath and wheezing.   Cardiovascular: Positive for chest pain and leg swelling.  Gastrointestinal: Positive for abdominal pain, diarrhea, nausea and vomiting.  Genitourinary: Negative for dysuria.  Neurological: Positive for headaches.  All other systems reviewed and are negative.    Physical Exam Updated Vital Signs BP (!) 145/74 (BP Location: Left Arm)   Pulse 77   Temp 98.3 F (36.8 C) (Oral)   Resp 20   Ht 5\' 1"  (1.549 m)   Wt 129.3 kg (285 lb)   LMP 08/06/2012   SpO2 95%   BMI 53.85 kg/m   Physical Exam  Constitutional: She is oriented to person, place, and  time. She appears well-developed and well-nourished. No distress.  Morbidly obese, calm, cooperative  HENT:  Head: Normocephalic and atraumatic.  Right Ear: Hearing, tympanic membrane, external ear and ear canal normal.  Left Ear: Hearing, tympanic membrane, external ear and ear canal normal.  Nose: Nose normal.  Mouth/Throat: Uvula is midline, oropharynx is clear and moist and mucous membranes are normal.  Eyes: Conjunctivae are normal. Pupils are equal, round, and reactive to light. Right eye exhibits no discharge. Left eye exhibits no discharge. No scleral icterus.  Neck: Normal range of motion.  Cardiovascular: Normal rate and regular rhythm.  Exam reveals no gallop and no friction rub.  No murmur heard. Pulmonary/Chest: Effort normal. No respiratory distress. She has decreased breath sounds. She exhibits tenderness (sternal tenderness).  Significant SOB with ambulation  Abdominal: Soft. Bowel sounds are normal. She exhibits no distension. There is no tenderness.  Musculoskeletal:  Trace bilateral edema  Neurological: She is alert and oriented to person, place, and time.  Skin: Skin is warm and dry.  Psychiatric: She has a normal mood and affect. Her behavior is normal.  Nursing note and vitals reviewed.    ED Treatments / Results  Labs (all labs ordered are listed, but only abnormal results are displayed) Labs Reviewed  BASIC METABOLIC PANEL - Abnormal; Notable for the following components:      Result Value   Chloride 98 (*)    Glucose, Bld 373 (*)    All other components within normal limits  CBC WITH DIFFERENTIAL/PLATELET - Abnormal; Notable for the following components:   WBC 18.0 (*)    RBC 5.25 (*)    Hemoglobin 16.6 (*)    HCT 49.6 (*)    Neutro Abs 16.1 (*)    All other components within normal limits  BRAIN NATRIURETIC PEPTIDE  TROPONIN I  D-DIMER, QUANTITATIVE (NOT AT [facility])    EKG  EKG Interpretation  Date/Time:  Thursday December 22 2017 01:02:54  EST Ventricular Rate:  80 PR Interval:    QRS Duration: 94 QT Interval:  429 QTC Calculation: 495 R Axis:   -32 Text Interpretation:  Sinus rhythm Left axis deviation Low voltage, precordial leads Abnormal R-wave progression, late transition Borderline prolonged QT interval When compared with ECG of 11/30/2017, No significant change was found Confirmed by Delora Fuel ([id number]) on 12/22/2017 1:07:44 AM       Radiology Dg Chest 2 View  Result Date: 12/21/2017 CLINICAL DATA:  Shortness of breath and back pain. EXAM: CHEST  2 VIEW COMPARISON:  Chest x-ray dated September 07, 2016. FINDINGS: The cardiac silhouette is mildly enlarged. Central peribronchial thickening and lower lobe predominant interstitial thickening. No focal consolidation, pleural effusion, or pneumothorax. No acute osseous abnormality. IMPRESSION: Mild cardiomegaly and interstitial pulmonary edema. Electronically Signed   By: Titus Dubin M.D.   On: 12/21/2017 17:56    Procedures Procedures (including critical care time)  Medications Ordered in ED Medications  furosemide (LASIX) injection 80 mg (80 mg Intravenous Given 12/22/17 0049)  insulin aspart (novoLOG) injection 12 Units (12 Units Intravenous Given 12/22/17 0114)     Initial Impression / Assessment and Plan / ED Course  I have reviewed the triage vital signs and the nursing notes.  Pertinent labs & imaging results that were available during my care of the patient were reviewed by me and considered in my medical decision making (see chart for details).  57 year old female presents with SOB likely related to mild fluid overload.  Her vital signs are normal.  On exam she has decreased lung sounds.  She has trace peripheral edema.  CBC is remarkable for leukocytosis of 18.  BMP is remarkable for hyperglycemia of 373.  Troponin is normal.  BNP is normal. D-dimer is normal. EKG shows sinus rhythm with borderline prolonged QT. Chest x-ray shows mild cardiomegaly with  interstitial pulmonary edema.  The patient was able to ambulate in the hallway with normal oxygen saturations however she is very short of breath with exertion.  80 mg of Lasix was ordered.  She had visit with Dr. Roxanne Mins who determine disposition.  Final Clinical  Impressions(s) / ED Diagnoses   Final diagnoses:  Acute on chronic congestive heart failure, unspecified heart failure type [facility])  Hyperglycemia    ED Discharge Orders    None       Recardo Evangelist, PA-C 11/57/26 2035    Delora Fuel, MD 59/74/16 [phone]

## 2017-12-21 NOTE — ED Triage Notes (Addendum)
Pt c/o SOB and sharp pains in her back that started at 1430 today. Pt reports hx of COPD and asthma. Nausea began at 1600, no vomiting.  Pt reports yellow productive cough that started yesterday.

## 2017-12-22 LAB — CBG MONITORING, ED: Glucose-Capillary: 438 mg/dL — ABNORMAL HIGH (ref 65–99)

## 2017-12-22 MED ORDER — INSULIN ASPART 100 UNIT/ML ~~LOC~~ SOLN
12.0000 [IU] | Freq: Once | SUBCUTANEOUS | Status: AC
Start: 2017-12-22 — End: 2017-12-22
  Administered 2017-12-22: 12 [IU] via INTRAVENOUS
  Filled 2017-12-22: qty 1

## 2017-12-22 NOTE — Discharge Instructions (Signed)
Double your dose of torsemide for the next three days.   Stay on a low salt diet.   Return if symptoms are getting worse.

## 2017-12-22 NOTE — ED Notes (Signed)
Pt ambulated around nurses station and O2 remained above 95%.

## 2017-12-22 NOTE — ED Notes (Signed)
EKG done and given to Dr Glick 

## 2018-02-21 DIAGNOSIS — K580 Irritable bowel syndrome with diarrhea: Secondary | ICD-10-CM | POA: Insufficient documentation

## 2018-03-01 ENCOUNTER — Telehealth: Payer: Self-pay

## 2018-03-01 ENCOUNTER — Telehealth: Payer: Self-pay | Admitting: *Deleted

## 2018-03-01 NOTE — Telephone Encounter (Signed)
[facility] note received reporting "Ronneisha Jett may have left a red bag of medication".  Returned call. "Was here with a [place] patient, red/burgandy tote bag, no larger than a school lunch box contained eight to ten medication bottles, B/P pills and more."  Registration, provider's collaborative notified.  Nothing left on yesterday.

## 2018-03-01 NOTE — Telephone Encounter (Signed)
Triage received bag in question at 2:20 pm from AD.    Family has arrived to [facility] for pick up /retirval of bag at 3:30 pm

## 2018-03-01 NOTE — Telephone Encounter (Signed)
Called [facility] and was unable to leave a message.  Called Edsel Awad and cell phone was busy.  Call Mrs. Ose's emergency contact (Heath-her son) and left a message with Heath's girlfriend, Andee Poles, that she had left her medications here in the lobby of the [facility].  Danielle said she would get the message to her.  Medications are locked in my office and I am the Surveyor, quantity of Nursing at the [facility].  Please contact me if this patient calls back. Gardiner Rhyme

## 2018-07-29 ENCOUNTER — Emergency Department (HOSPITAL_COMMUNITY)
Admission: EM | Admit: 2018-07-29 | Discharge: 2018-07-30 | Disposition: A | Discharge status: Home / Self Care | Payer: Self-pay | Attending: Emergency Medicine | Admitting: Emergency Medicine

## 2018-07-29 ENCOUNTER — Other Ambulatory Visit: Payer: Self-pay

## 2018-07-29 ENCOUNTER — Emergency Department (HOSPITAL_COMMUNITY): Payer: Self-pay

## 2018-07-29 ENCOUNTER — Encounter (HOSPITAL_COMMUNITY): Payer: Self-pay | Admitting: Emergency Medicine

## 2018-07-29 DIAGNOSIS — R0602 Shortness of breath: Secondary | ICD-10-CM | POA: Insufficient documentation

## 2018-07-29 DIAGNOSIS — J449 Chronic obstructive pulmonary disease, unspecified: Secondary | ICD-10-CM | POA: Insufficient documentation

## 2018-07-29 DIAGNOSIS — I10 Essential (primary) hypertension: Secondary | ICD-10-CM | POA: Insufficient documentation

## 2018-07-29 DIAGNOSIS — Z79899 Other long term (current) drug therapy: Secondary | ICD-10-CM | POA: Insufficient documentation

## 2018-07-29 DIAGNOSIS — E876 Hypokalemia: Secondary | ICD-10-CM | POA: Insufficient documentation

## 2018-07-29 DIAGNOSIS — Z794 Long term (current) use of insulin: Secondary | ICD-10-CM | POA: Insufficient documentation

## 2018-07-29 DIAGNOSIS — E119 Type 2 diabetes mellitus without complications: Secondary | ICD-10-CM | POA: Insufficient documentation

## 2018-07-29 DIAGNOSIS — K219 Gastro-esophageal reflux disease without esophagitis: Secondary | ICD-10-CM | POA: Insufficient documentation

## 2018-07-29 DIAGNOSIS — Z7722 Contact with and (suspected) exposure to environmental tobacco smoke (acute) (chronic): Secondary | ICD-10-CM | POA: Insufficient documentation

## 2018-07-29 LAB — BASIC METABOLIC PANEL
Anion gap: 11 (ref 5–15)
BUN: 10 mg/dL (ref 6–20)
CO2: 31 mmol/L (ref 22–32)
Calcium: 9.1 mg/dL (ref 8.9–10.3)
Chloride: 97 mmol/L — ABNORMAL LOW (ref 98–111)
Creatinine, Ser: 0.98 mg/dL (ref 0.44–1.00)
GFR calc Af Amer: >60 mL/min (ref >60)
GFR calc non Af Amer: >60 mL/min (ref >60)
Glucose, Bld: 216 mg/dL — ABNORMAL HIGH (ref 70–99)
Potassium: 3.2 mmol/L — ABNORMAL LOW (ref 3.5–5.1)
Sodium: 139 mmol/L (ref 135–145)

## 2018-07-29 LAB — CBC
HCT: 46.4 % — ABNORMAL HIGH (ref 36.0–46.0)
Hemoglobin: 16.3 g/dL — ABNORMAL HIGH (ref 12.0–15.0)
MCH: 32.1 pg (ref 26.0–34.0)
MCHC: 35.1 g/dL (ref 30.0–36.0)
MCV: 91.5 fL (ref 78.0–100.0)
Platelets: 267 10*3/uL (ref 150–400)
RBC: 5.07 MIL/uL (ref 3.87–5.11)
RDW: 13.5 % (ref 11.5–15.5)
WBC: 12.6 10*3/uL — ABNORMAL HIGH (ref 4.0–10.5)

## 2018-07-29 LAB — TROPONIN I: Troponin I: <0.03 ng/mL (ref <0.03)

## 2018-07-29 IMAGING — DX DG CHEST 2V
2 series · 2 of 2 positions shown · non-contrast
Comparison: Chest x-ray dated [DATE].

CLINICAL DATA: Burning in her chest x 3 days and SOB x2 weeks. Hx
of asthma, pneumonia, COPD, diabetes, HTN.

EXAM:
CHEST - 2 VIEW

[chest pa]
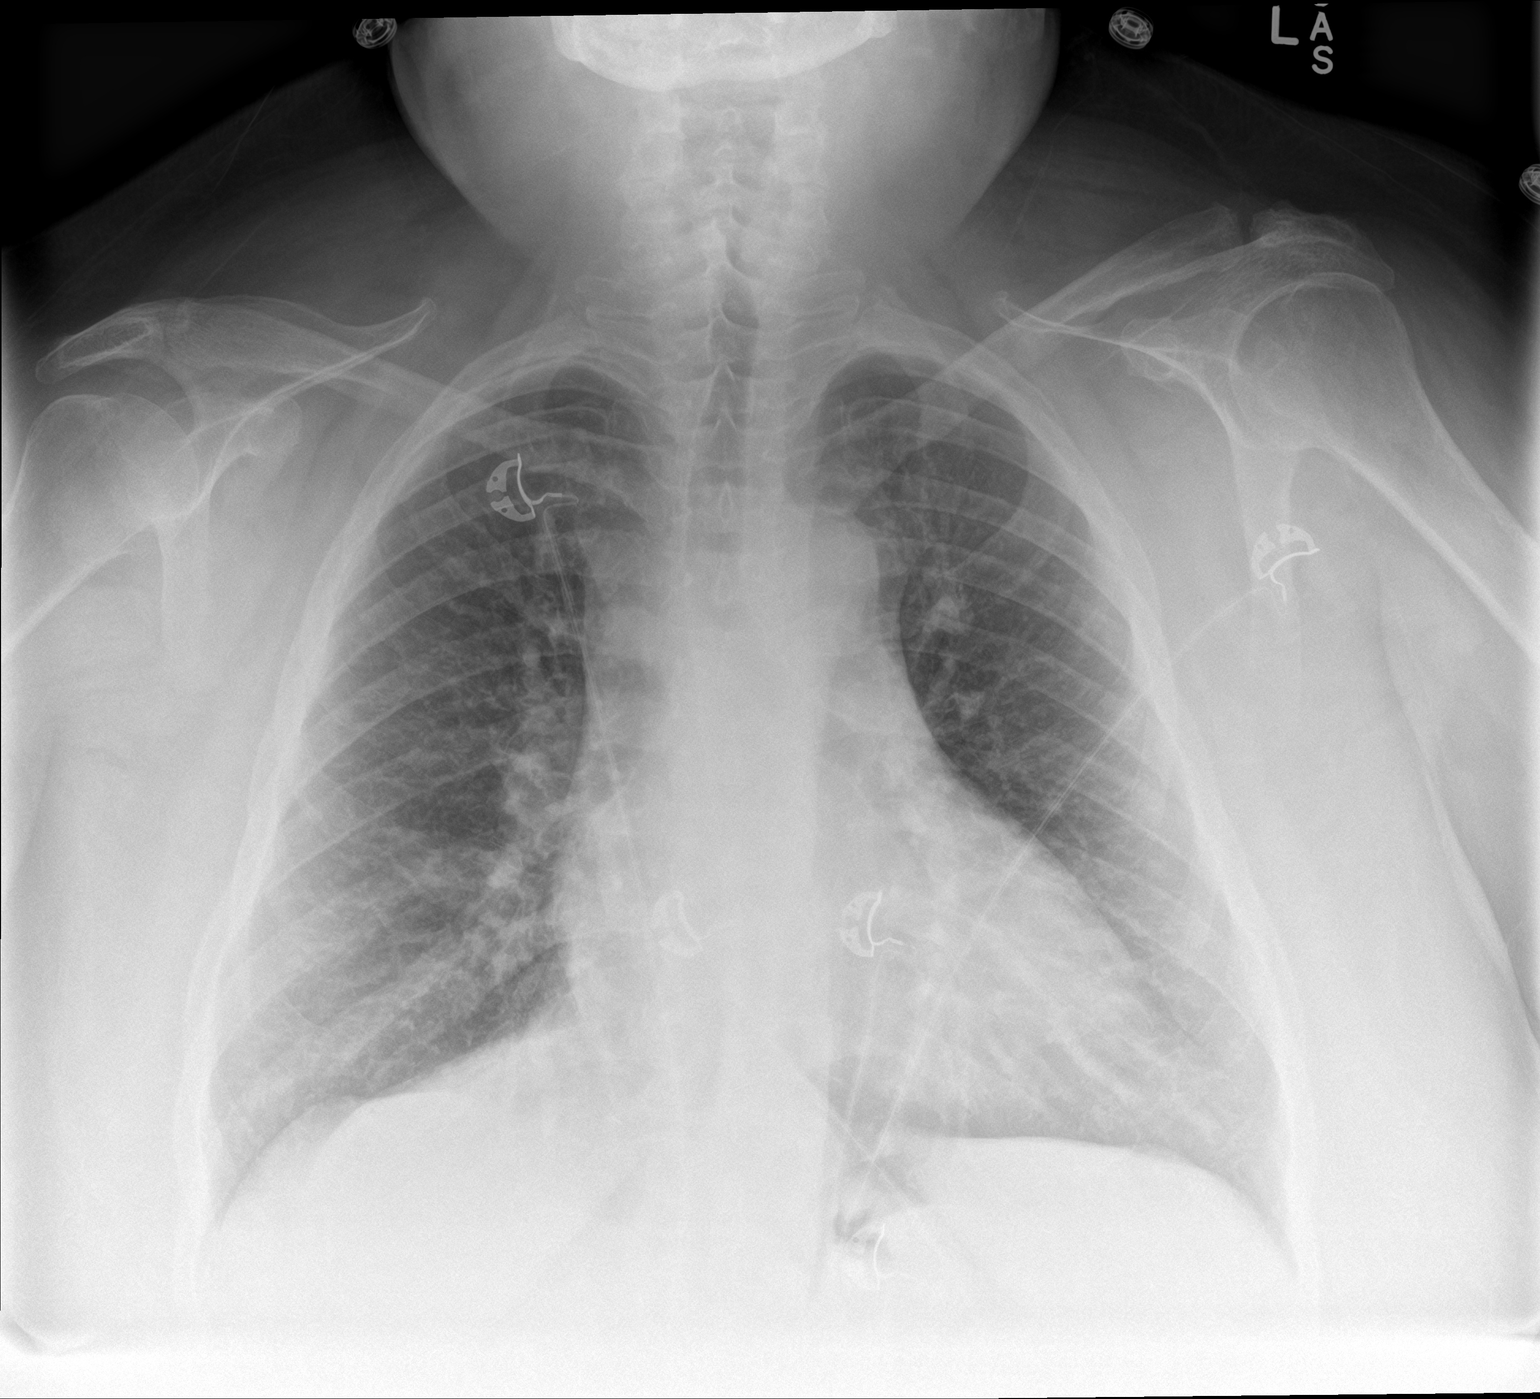

[chest lat]
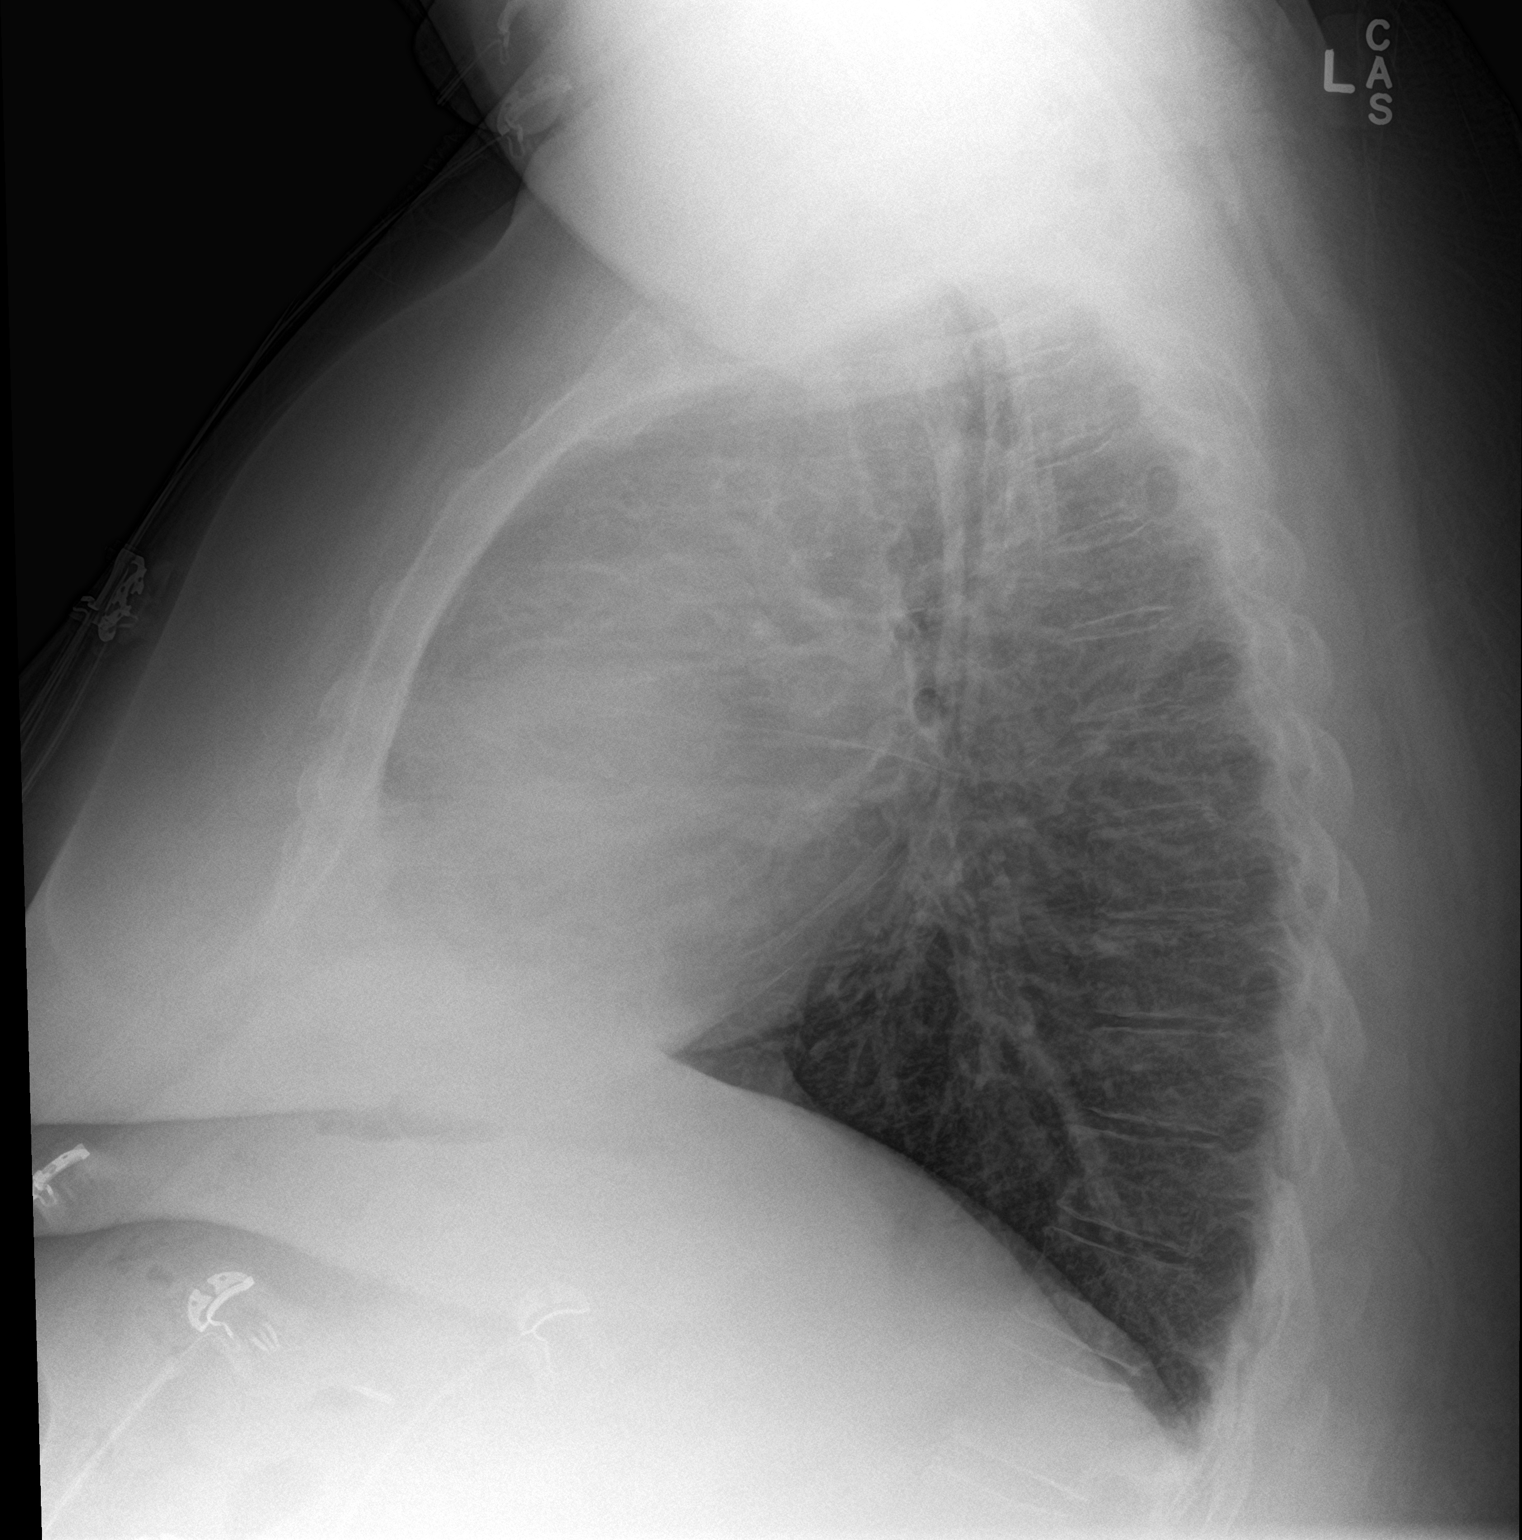

[2 of 2 positions shown; findings below may reference images not displayed]

FINDINGS: Heart size and mediastinal contours are stable. Lungs are clear. No
new confluent opacity to suggest a developing pneumonia. No pleural
effusion or pneumothorax seen. No acute or suspicious osseous
finding.
IMPRESSION: No active cardiopulmonary disease. No evidence of pneumonia or
pulmonary edema.

## 2018-07-29 MED ORDER — FAMOTIDINE 20 MG PO TABS
20.0000 mg | ORAL_TABLET | Freq: Once | ORAL | Status: AC
  Administered 2018-07-29: 20 mg via ORAL
  Filled 2018-07-29: qty 1

## 2018-07-29 MED ORDER — GI COCKTAIL ~~LOC~~
30.0000 mL | Freq: Once | ORAL | Status: AC
  Administered 2018-07-29: 30 mL via ORAL
  Filled 2018-07-29: qty 30

## 2018-07-29 NOTE — ED Triage Notes (Signed)
Pt reports burning in her chest started 3 days ago and SOB for 2 weeks.

## 2018-07-29 NOTE — ED Provider Notes (Signed)
[facility] EMERGENCY DEPARTMENT Provider Note   CSN: [id number] Arrival date & time: 07/29/18  2159  Time seen 23:17 PM    History   Chief Complaint Chief Complaint  Patient presents with  . Chest Pain    HPI [name] is a 57 y.o. female.  HPI patient states 3 to 4 days ago she started having tingling all over.  She also describes patches on her back that tingle and hurt and indicates an area about 6 cm in size.  She states the area moves around on her back.  She has one area at a time that she has a discomfort.  She denies seeing a rash.  She thinks it feels like shingles although she is never had it before.  She also states she has had this before with a low potassium.  She states she feels short of breath and has had history of COPD.  She states it started 3 to 4 days ago.  She describes dyspnea on exertion and PND.  She has a mild increase of her cough.  She denies any sputum production.  She denies fever or chills.  She states today her family has noticed her face is red and it feels warm.  She states yesterday her blood pressure was elevated at 173/89.  She denies any chest pain but states she has burning in her left breast and in her epigastric area that radiates up into her lower central chest.  She has a history of reflux and has been on Nexium for about 15 years.  She states her feet have been swelling for the past week and normally in the morning the swelling will be gone however this past week they still have some swelling.  She feels like her abdomen gets bloated.  Patient states she has never smoked however her father smoked when she was growing up and her husband smokes.  She states she has a history of COPD.  She is not on oxygen at home.  She does have a inhaler that she uses, Breo, however she states she does not use her albuterol rescue inhaler anymore.  PCP Dione Housekeeper, MD  Past Medical History:  Diagnosis Date  . Asthma   . COPD (chronic obstructive  pulmonary disease) ([place])   . Depression   . Diabetes mellitus   . Diastolic dysfunction 03/07/3243  . Hypertension   . Prolonged QT interval 05/14/2015    Patient Active Problem List   Diagnosis Date Noted  . Diastolic dysfunction 12/08/7251  . Prolonged QT interval 05/14/2015  . Palpitations 07/23/2014  . Generalized weakness 07/23/2014  . Hypokalemia 07/22/2014  . Diabetes mellitus ([place]) 07/22/2014  . Hypertension 07/22/2014  . COPD (chronic obstructive pulmonary disease) ([place]) 07/22/2014  . Depression 07/22/2014    Past Surgical History:  Procedure Laterality Date  . CATARACT EXTRACTION    . CESAREAN SECTION    . CHOLECYSTECTOMY       OB History    Gravida  3   Para  2   Term  2   Preterm      AB  1   Living  2     SAB  1   TAB      Ectopic      Multiple      Live Births               Home Medications    Prior to Admission medications   Medication Sig Start Date End Date  Taking? Authorizing Provider  albuterol (PROAIR HFA) 108 (90 BASE) MCG/ACT inhaler Inhale 2 puffs into the lungs every 6 (six) hours as needed for wheezing or shortness of breath.    [provider]  amLODipine (NORVASC) 5 MG tablet Take 5 mg by mouth daily.  12/31/15   [provider]  atorvastatin (LIPITOR) 20 MG tablet Take 20 mg by mouth at bedtime.  03/27/16   [provider]  escitalopram (LEXAPRO) 20 MG tablet Take 20 mg by mouth every evening. 02/14/17   [provider]  esomeprazole (NEXIUM) 20 MG capsule Take 20 mg by mouth daily at 12 noon.    [provider]  fluticasone furoate-vilanterol (BREO ELLIPTA) 200-25 MCG/INH AEPB Inhale 1 puff into the lungs daily.    [provider]  insulin NPH Human (HUMULIN N,NOVOLIN N) 100 UNIT/ML injection Inject 80-100 Units into the skin at bedtime.    [provider]  insulin regular (NOVOLIN R,HUMULIN R) 100 units/mL injection Inject 60 Units into the skin 3 (three) times  daily before meals.    [provider]  LORazepam (ATIVAN) 1 MG tablet Take 1 mg by mouth at bedtime as needed for anxiety or sleep.  10/08/15   [provider]  meclizine (ANTIVERT) 25 MG tablet Take 1 tablet (25 mg total) by mouth 4 (four) times daily as needed for dizziness. 11/30/17   Rolland Porter, MD  metolazone (ZAROXOLYN) 5 MG tablet Take 5 mg by mouth daily as needed (for swelling).  01/10/17   [provider]  potassium chloride 20 MEQ TBCR Take 2 twice a day for the next 7 days and than 1 po QD Patient taking differently: Take 20 mEq by mouth 2 (two) times daily.  11/30/17   Rolland Porter, MD  potassium chloride SA (K-DUR,KLOR-CON) 20 MEQ tablet Take 2 tablets (40 mEq total) by mouth 2 (two) times daily. 03/21/17 03/24/17  Long, Wonda Olds, MD  propranolol ER (INDERAL LA) 120 MG 24 hr capsule Take 120 mg by mouth at bedtime.     [provider]  silver sulfADIAZINE (SILVADENE) 1 % cream Apply 1 application topically daily. 09/09/17   Florian Buff, MD  spironolactone (ALDACTONE) 25 MG tablet Take 1 tablet (25 mg total) by mouth daily. 05/16/15   Samuella Cota, MD  torsemide (DEMADEX) 20 MG tablet Take 1 tablet (20 mg total) by mouth 2 (two) times daily. 05/16/15   Samuella Cota, MD    Family History Family History  Problem Relation Age of Onset  . Stroke Mother   . Diabetes Father   . Heart failure Father   . Hypertension Father   . Diabetes Sister   . Heart failure Sister   . Hypertension Sister   . Stroke Sister   . Cancer Other   . Stroke Sister     Social History Social History   Tobacco Use  . Smoking status: Passive Smoke Exposure - Never Smoker  . Smokeless tobacco: Never Used  Substance Use Topics  . Alcohol use: No    Alcohol/week: 0.0 standard drinks  . Drug use: No  lives at home Lives with husband   Allergies   Benicar [olmesartan]; Codeine; Sulfa antibiotics; and Trulicity [dulaglutide]   Review of Systems Review of  Systems  All other systems reviewed and are negative.    Physical Exam Updated Vital Signs BP (!) 120/58 (BP Location: Left Arm)   Pulse 65   Resp 20   Ht 5\' 1"  (  1.549 m)   Wt (!) 139.3 kg   LMP 08/06/2012   SpO2 96%   BMI 58.01 kg/m   Vital signs normal    Physical Exam  Constitutional: She is oriented to person, place, and time. She appears well-developed and well-nourished.  Non-toxic appearance. She does not appear ill. No distress.  HENT:  Head: Normocephalic and atraumatic.  Right Ear: External ear normal.  Left Ear: External ear normal.  Nose: Nose normal. No mucosal edema or rhinorrhea.  Mouth/Throat: Oropharynx is clear and moist and mucous membranes are normal. No dental abscesses or uvula swelling.  Eyes: Pupils are equal, round, and reactive to light. Conjunctivae and EOM are normal.  Neck: Normal range of motion and full passive range of motion without pain. Neck supple.  Cardiovascular: Normal rate, regular rhythm and normal heart sounds. Exam reveals no gallop and no friction rub.  No murmur heard. Pulmonary/Chest: Effort normal. No respiratory distress. She has decreased breath sounds. She has no wheezes. She has no rhonchi. She has no rales. She exhibits no tenderness and no crepitus.  Abdominal: Soft. Normal appearance and bowel sounds are normal. She exhibits no distension. There is generalized tenderness. There is no rebound and no guarding.  Musculoskeletal: Normal range of motion. She exhibits no edema or tenderness.  Moves all extremities well.  Patient has mild swelling of her upper lower legs, there is no edema of her lower leg, ankles, or dorsum of her feet.  Her skin is very dried and is starting to have some hyperpigmentation changes.  She also has excoriations where she has been scratching her legs.  Neurological: She is alert and oriented to person, place, and time. She has normal strength. No cranial nerve deficit.  Skin: Skin is warm, dry and  intact. No rash noted. No erythema. No pallor.  Patient does not have any rash.  Psychiatric: She has a normal mood and affect. Her speech is normal and behavior is normal. Her mood appears not anxious.  Nursing note and vitals reviewed.    ED Treatments / Results  Labs (all labs ordered are listed, but only abnormal results are displayed) Results for orders placed or performed during the hospital encounter of 02/58/52  Basic metabolic panel  Result Value Ref Range   Sodium 139 135 - 145 mmol/L   Potassium 3.2 (L) 3.5 - 5.1 mmol/L   Chloride 97 (L) 98 - 111 mmol/L   CO2 31 22 - 32 mmol/L   Glucose, Bld 216 (H) 70 - 99 mg/dL   BUN 10 6 - 20 mg/dL   Creatinine, Ser 0.98 0.44 - 1.00 mg/dL   Calcium 9.1 8.9 - 10.3 mg/dL   GFR calc non Af Amer >60 >60 mL/min   GFR calc Af Amer >60 >60 mL/min   Anion gap 11 5 - 15  CBC  Result Value Ref Range   WBC 12.6 (H) 4.0 - 10.5 K/uL   RBC 5.07 3.87 - 5.11 MIL/uL   Hemoglobin 16.3 (H) 12.0 - 15.0 g/dL   HCT 46.4 (H) 36.0 - 46.0 %   MCV 91.5 78.0 - 100.0 fL   MCH 32.1 26.0 - 34.0 pg   MCHC 35.1 30.0 - 36.0 g/dL   RDW 13.5 11.5 - 15.5 %   Platelets 267 150 - 400 K/uL  Troponin I  Result Value Ref Range   Troponin I <0.03 <0.03 ng/mL  Hepatic function panel  Result Value Ref Range   Total Protein 7.2 6.5 - 8.1 g/dL  Albumin 3.6 3.5 - 5.0 g/dL   AST 21 15 - 41 U/L   ALT 18 0 - 44 U/L   Alkaline Phosphatase 123 38 - 126 U/L   Total Bilirubin 1.2 0.3 - 1.2 mg/dL   Bilirubin, Direct 0.2 0.0 - 0.2 mg/dL   Indirect Bilirubin 1.0 (H) 0.3 - 0.9 mg/dL  Lipase, blood  Result Value Ref Range   Lipase 16 11 - 51 U/L  Brain natriuretic peptide  Result Value Ref Range   B Natriuretic Peptide 47.0 0.0 - 100.0 pg/mL  Magnesium  Result Value Ref Range   Magnesium 2.0 1.7 - 2.4 mg/dL      EKG None  ED ECG REPORT   Date: 07/30/2018  Rate: 66  Rhythm: normal sinus rhythm  QRS Axis: left  Intervals: normal  ST/T Wave abnormalities:  nonspecific ST/T changes  Conduction Disutrbances:none  Narrative Interpretation: baseline wander  Old EKG Reviewed: none available  I have personally reviewed the EKG tracing and agree with the computerized printout as noted.   Radiology Dg Chest 2 View  Result Date: 07/29/2018 CLINICAL DATA:  Burning in her chest x 3 days and SOB x2 weeks. Hx of asthma, pneumonia, COPD, diabetes, HTN. EXAM: CHEST - 2 VIEW COMPARISON:  Chest x-ray dated 12/21/2017. FINDINGS: Heart size and mediastinal contours are stable. Lungs are clear. No new confluent opacity to suggest a developing pneumonia. No pleural effusion or pneumothorax seen. No acute or suspicious osseous finding. IMPRESSION: No active cardiopulmonary disease. No evidence of pneumonia or pulmonary edema. Electronically Signed   By: Franki Cabot M.D.   On: 07/29/2018 23:17    Procedures Procedures (including critical care time)  Medications Ordered in ED Medications  gi cocktail (Maalox,Lidocaine,Donnatal) (30 mLs Oral Given 07/29/18 2340)  famotidine (PEPCID) tablet 20 mg (20 mg Oral Given 07/29/18 2340)  potassium chloride SA (K-DUR,KLOR-CON) CR tablet 40 mEq (20 mEq Oral Given 07/30/18 0046)  albuterol (PROVENTIL) (2.5 MG/3ML) 0.083% nebulizer solution 5 mg (5 mg Nebulization Given 07/30/18 0024)     Initial Impression / Assessment and Plan / ED Course  I have reviewed the triage vital signs and the nursing notes.  Pertinent labs & imaging results that were available during my care of the patient were reviewed by me and considered in my medical decision making (see chart for details).   We discussed her test results which are resulted at the time of my exam.  She does have a mildly low potassium however she is been much lower in the past down to 2.6.  I added lab work to her previously ordered lab work done by Engineer, civil (consulting).  I added magnesium, LFTs, and a BMP due to her complaints of swelling.   Patient was given a GI cocktail and  oral Pepcid.  At time of discharge she states her symptoms are much improved.  We went over the rest of her laboratory testing which were normal.  We discussed increasing her Nexium to twice a day for the next 2 weeks then back down to once a day.  She states she saw gastroenterologist in October, possibly Dr. Gala Romney.  She was advised to see him again if her symptoms do not improve.  Patient states she has potassium pills at home, she was advised to take extra for the next several days.   Final Clinical Impressions(s) / ED Diagnoses   Final diagnoses:  Gastroesophageal reflux disease without esophagitis  Hypokalemia    ED Discharge Orders    None  Plan discharge  Rolland Porter, MD, Barbette Or, MD 07/30/18 [phone]

## 2018-07-30 LAB — HEPATIC FUNCTION PANEL
ALT: 18 U/L (ref 0–44)
AST: 21 U/L (ref 15–41)
Albumin: 3.6 g/dL (ref 3.5–5.0)
Alkaline Phosphatase: 123 U/L (ref 38–126)
Bilirubin, Direct: 0.2 mg/dL (ref 0.0–0.2)
Indirect Bilirubin: 1 mg/dL — ABNORMAL HIGH (ref 0.3–0.9)
Total Bilirubin: 1.2 mg/dL (ref 0.3–1.2)
Total Protein: 7.2 g/dL (ref 6.5–8.1)

## 2018-07-30 LAB — BRAIN NATRIURETIC PEPTIDE: B Natriuretic Peptide: 47 pg/mL (ref 0.0–100.0)

## 2018-07-30 LAB — MAGNESIUM: Magnesium: 2 mg/dL (ref 1.7–2.4)

## 2018-07-30 LAB — LIPASE, BLOOD: Lipase: 16 U/L (ref 11–51)

## 2018-07-30 MED ORDER — ALBUTEROL SULFATE (2.5 MG/3ML) 0.083% IN NEBU
5.0000 mg | INHALATION_SOLUTION | Freq: Once | RESPIRATORY_TRACT | Status: AC
  Administered 2018-07-30: 5 mg via RESPIRATORY_TRACT
  Filled 2018-07-30: qty 6

## 2018-07-30 MED ORDER — POTASSIUM CHLORIDE CRYS ER 20 MEQ PO TBCR
40.0000 meq | EXTENDED_RELEASE_TABLET | Freq: Once | ORAL | Status: AC
  Administered 2018-07-30 (×2): 20 meq via ORAL
  Filled 2018-07-30: qty 2

## 2018-07-30 MED ORDER — POTASSIUM CHLORIDE CRYS ER 20 MEQ PO TBCR
EXTENDED_RELEASE_TABLET | ORAL | Status: AC
  Administered 2018-07-30: 20 meq via ORAL
  Filled 2018-07-30: qty 1

## 2018-07-30 NOTE — Discharge Instructions (Addendum)
Look at the diet information for people who have problems with acid reflux.  Increase your Nexium to twice a day for the next 2 weeks and then drop it back down to once a day.  If you continue to have problems consider seeing your gastroenterologist again.  Take extra potassium pills for the next 4 days.  Look at the diet information for foods that are high in potassium.

## 2018-07-30 NOTE — ED Notes (Signed)
Patient states burning symptoms improved.

## 2018-12-28 ENCOUNTER — Other Ambulatory Visit (HOSPITAL_COMMUNITY): Payer: Self-pay | Admitting: Adult Health Nurse Practitioner

## 2018-12-28 DIAGNOSIS — S0990XA Unspecified injury of head, initial encounter: Secondary | ICD-10-CM

## 2019-01-02 ENCOUNTER — Ambulatory Visit (HOSPITAL_COMMUNITY)
Admission: RE | Admit: 2019-01-02 | Discharge: 2019-01-02 | Disposition: A | Discharge status: Home / Self Care | Payer: BLUE CROSS/BLUE SHIELD | Source: Ambulatory Visit | Attending: Adult Health Nurse Practitioner | Admitting: Adult Health Nurse Practitioner

## 2019-01-02 DIAGNOSIS — S0990XA Unspecified injury of head, initial encounter: Secondary | ICD-10-CM | POA: Insufficient documentation

## 2019-01-02 IMAGING — CT CT HEAD W/O CM
3 series · 15 of 47 positions shown, 18 images · non-contrast
Comparison: [DATE]

CLINICAL DATA: Recent fall with periorbital bruising

EXAM:
CT HEAD WITHOUT CONTRAST
TECHNIQUE: Contiguous axial images were obtained from the base of the skull
through the vertex without intravenous contrast.

[Series 2: head trauma wo · axial · 0.43mm/px · z∈[+1455,+1580]mm · 9 of 31 slices shown, 12 images]
[im 3/31  brain]
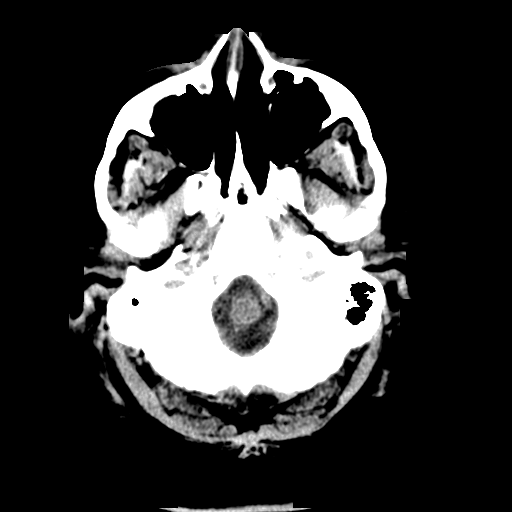
[im 3/31  bone]
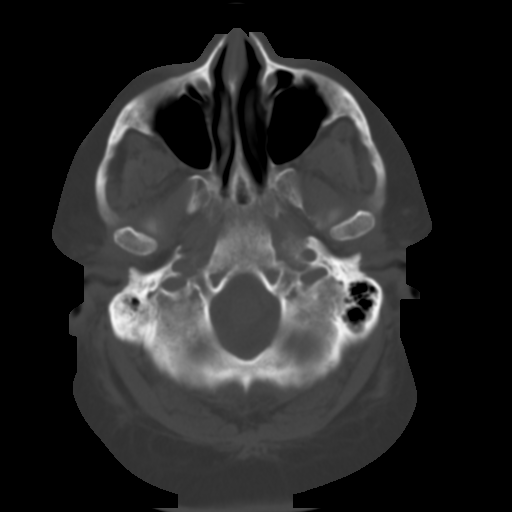
[im 6/31  brain]
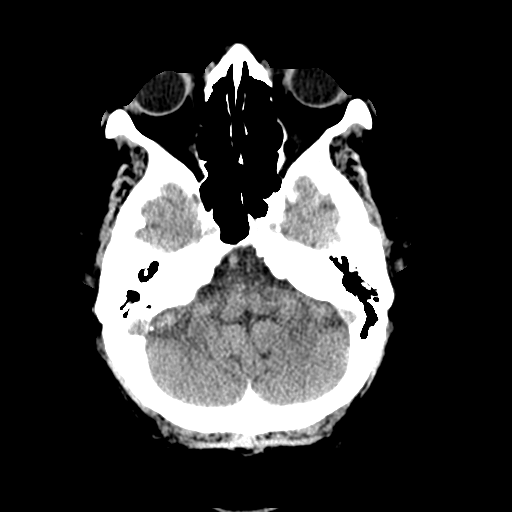
[im 9/31  brain]
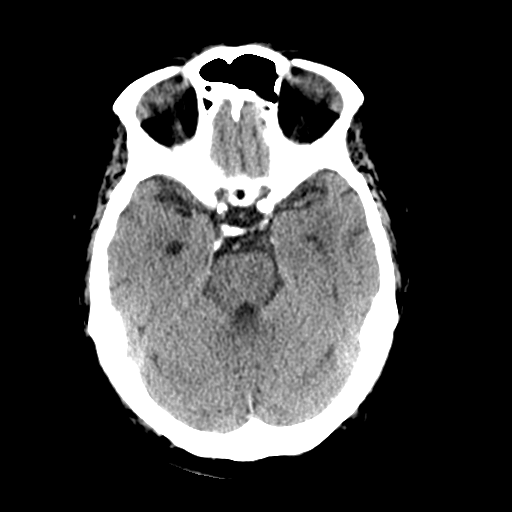
[im 12/31  brain]
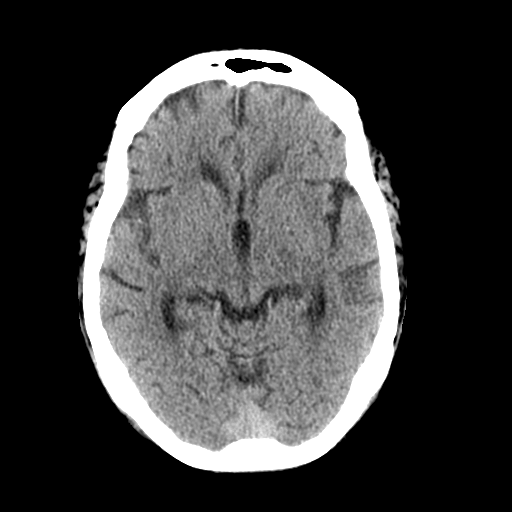
[im 16/31  brain]
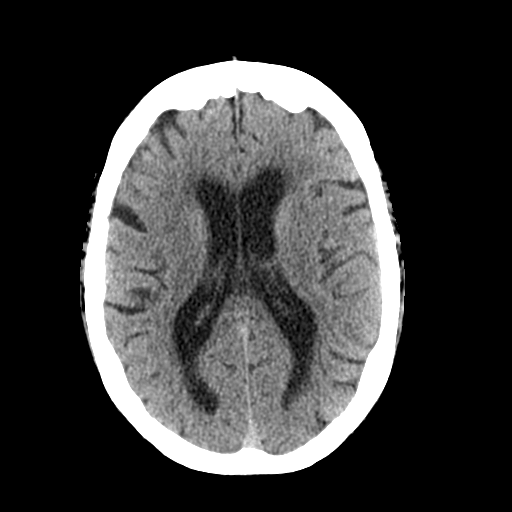
[im 16/31  bone]
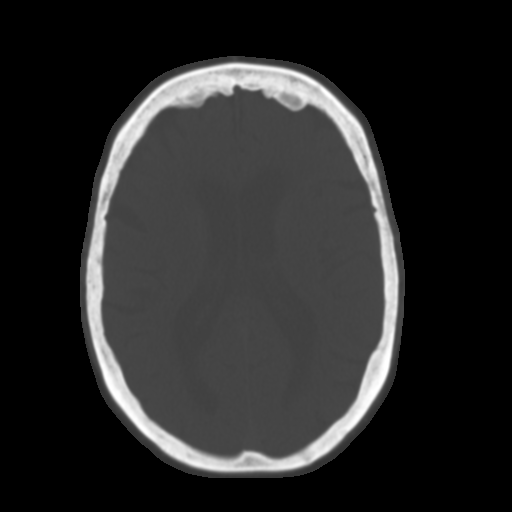
[im 19/31  brain]
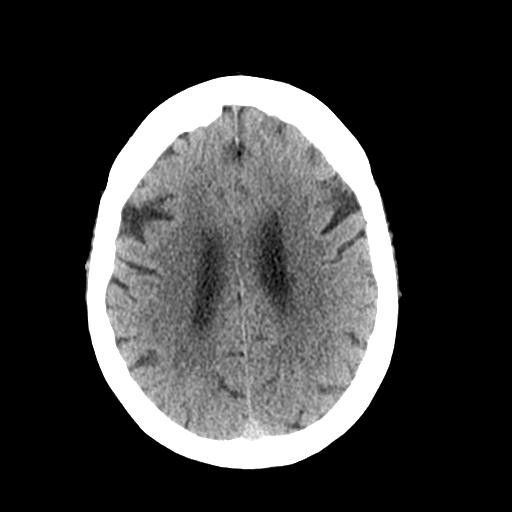
[im 22/31  brain]
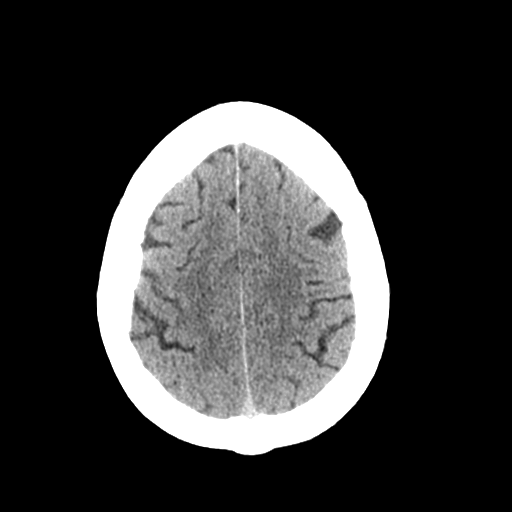
[im 25/31  brain]
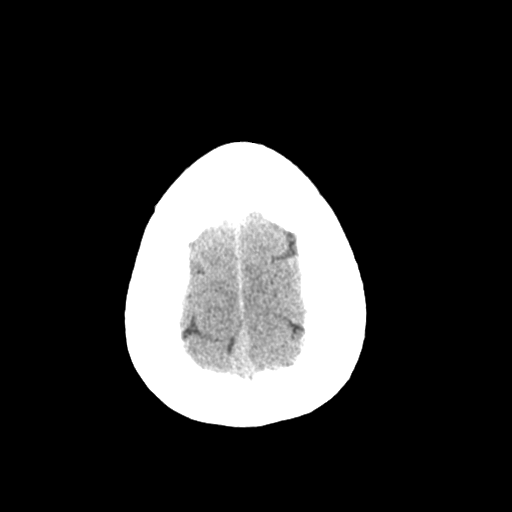
[im 28/31  brain]
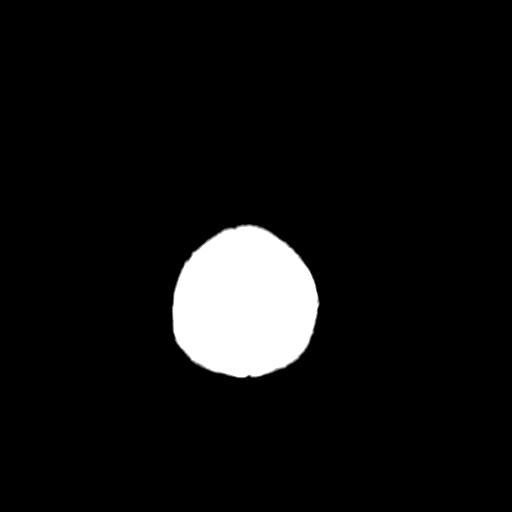
[im 28/31  bone]
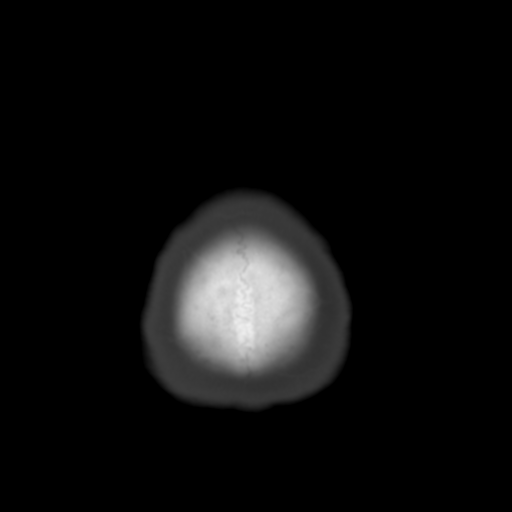

[Series 4: coronal soft tissue · coronal · 0.29mm/px · 3 of 76 slices shown]
[im 26/76  brain]
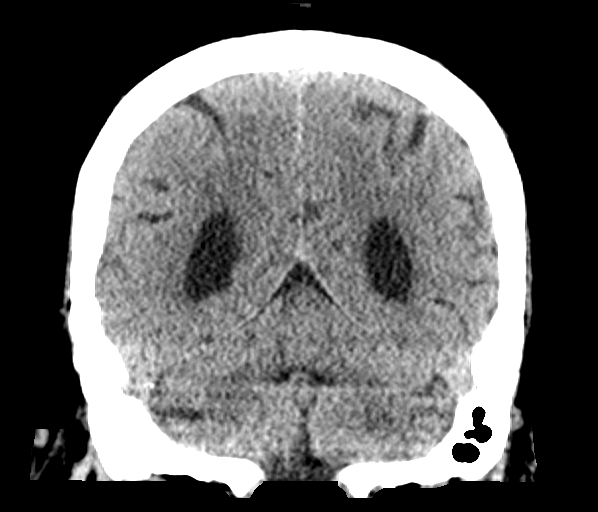
[im 34/76  brain]
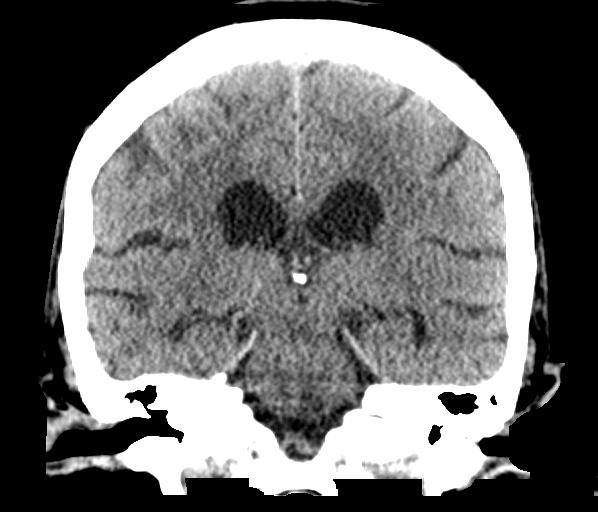
[im 42/76  brain]
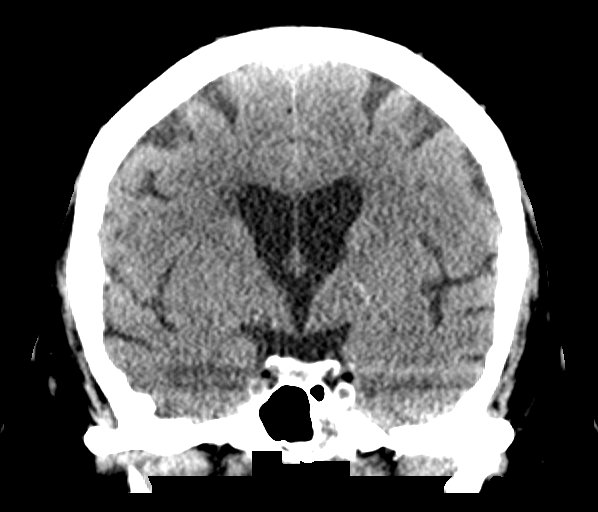

[Series 5: sagittal soft tissue · sagittal · 0.29mm/px · 3 of 61 slices shown]
[im 21/61  brain]
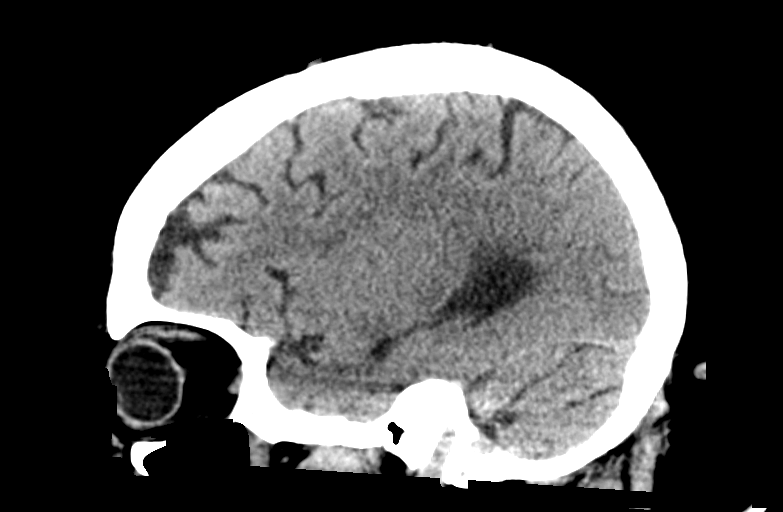
[im 31/61  brain]
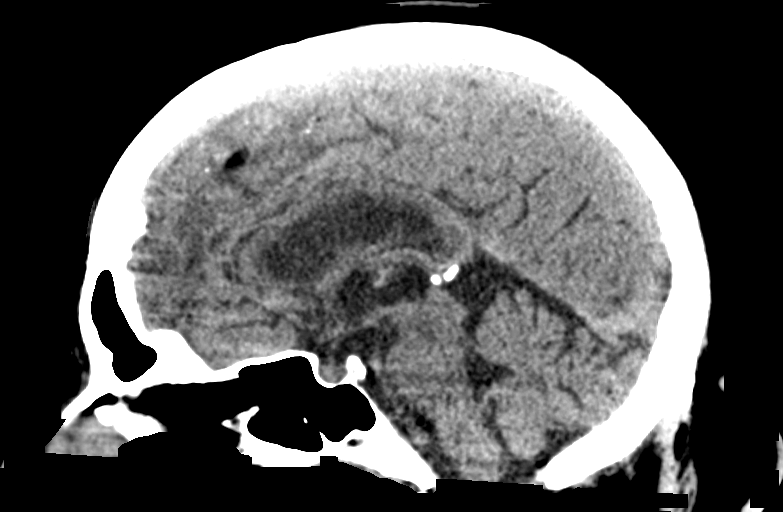
[im 41/61  brain]
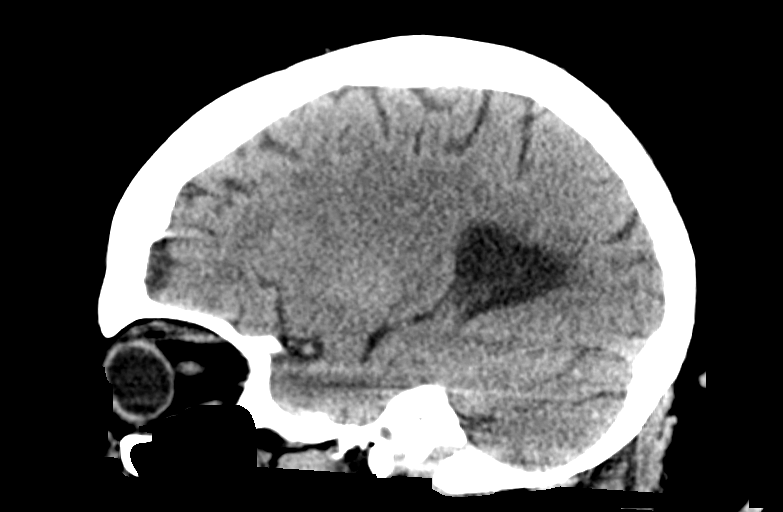

[15 of 47 positions shown; findings below may reference images not displayed]

FINDINGS: Brain: Mild chronic white matter ischemic change is noted stable
from the prior exam. No findings to suggest acute hemorrhage, acute
infarction or space-occupying mass lesion are noted.

Vascular: No hyperdense vessel or unexpected calcification.

Skull: Normal. Negative for fracture or focal lesion.

Sinuses/Orbits: No acute finding.

Other: Minimal soft tissue swelling is noted about the orbits
consistent with the recent injury.
IMPRESSION: Mild soft tissue injury.  No acute intracranial abnormality noted.

## 2019-01-16 DIAGNOSIS — F321 Major depressive disorder, single episode, moderate: Secondary | ICD-10-CM | POA: Insufficient documentation

## 2019-01-16 DIAGNOSIS — N393 Stress incontinence (female) (male): Secondary | ICD-10-CM | POA: Insufficient documentation

## 2019-02-21 NOTE — Progress Notes (Deleted)
Psychiatric Initial Adult Assessment   Patient Identification: [name] MRN:  [id number] Date of Evaluation:  02/21/2019 Referral Source: *** Chief Complaint:   Visit Diagnosis: No diagnosis found.  History of Present Illness:   [name] is a 58 y.o. year old female with a history of depression, CHF, hypertension, diabetes,COPD,  who is referred for   EKG 12/2017 QTC Calculation:495   Associated Signs/Symptoms: Depression Symptoms:  {DEPRESSION SYMPTOMS:20000} (Hypo) Manic Symptoms:  {BHH MANIC SYMPTOMS:22872} Anxiety Symptoms:  {BHH ANXIETY SYMPTOMS:22873} Psychotic Symptoms:  {BHH PSYCHOTIC SYMPTOMS:22874} PTSD Symptoms: {BHH PTSD SYMPTOMS:22875}  Past Psychiatric History:  Outpatient:  Psychiatry admission:  Previous suicide attempt:  Past trials of medication:  History of violence:   Previous Psychotropic Medications: {YES/NO:21197}  Substance Abuse History in the last 12 months:  {yes no:314532}  Consequences of Substance Abuse: {BHH CONSEQUENCES OF SUBSTANCE ABUSE:22880}  Past Medical History:  Past Medical History:  Diagnosis Date  . Asthma   . COPD (chronic obstructive pulmonary disease) ([place])   . Depression   . Diabetes mellitus   . Diastolic dysfunction 12/07/8784  . Hypertension   . Prolonged QT interval 05/14/2015    Past Surgical History:  Procedure Laterality Date  . CATARACT EXTRACTION    . CESAREAN SECTION    . CHOLECYSTECTOMY      Family Psychiatric History: ***  Family History:  Family History  Problem Relation Age of Onset  . Stroke Mother   . Diabetes Father   . Heart failure Father   . Hypertension Father   . Diabetes Sister   . Heart failure Sister   . Hypertension Sister   . Stroke Sister   . Cancer Other   . Stroke Sister     Social History:   Social History   Socioeconomic History  . Marital status: Married    Spouse name: Not on file  . Number of children: Not on file  . Years of education:  Not on file  . Highest education level: Not on file  Occupational History  . Not on file  Social Needs  . Financial resource strain: Not on file  . Food insecurity:    Worry: Not on file    Inability: Not on file  . Transportation needs:    Medical: Not on file    Non-medical: Not on file  Tobacco Use  . Smoking status: Passive Smoke Exposure - Never Smoker  . Smokeless tobacco: Never Used  Substance and Sexual Activity  . Alcohol use: No    Alcohol/week: 0.0 standard drinks  . Drug use: No  . Sexual activity: Yes    Birth control/protection: None  Lifestyle  . Physical activity:    Days per week: Not on file    Minutes per session: Not on file  . Stress: Not on file  Relationships  . Social connections:    Talks on phone: Not on file    Gets together: Not on file    Attends religious service: Not on file    Active member of club or organization: Not on file    Attends meetings of clubs or organizations: Not on file    Relationship status: Not on file  Other Topics Concern  . Not on file  Social History Narrative  . Not on file    Additional Social History: ***  Allergies:   Allergies  Allergen Reactions  . Benicar [Olmesartan] Swelling  . Codeine Other (See Comments)    Confusion   . Sulfa  Antibiotics Swelling    Whole face swells  . Trulicity [Dulaglutide] Diarrhea    Metabolic Disorder Labs: Lab Results  Component Value Date   HGBA1C (H) 09/02/2008    10.0 (NOTE)   The ADA recommends the following therapeutic goal for glycemic   control related to Hgb A1C measurement:   Goal of Therapy:   < 7.0% Hgb A1C   Reference: American Diabetes Association: Clinical Practice   Recommendations 2008, Diabetes Care,  2008, 31:(Suppl 1).   MPG 240 09/02/2008   No results found for: PROLACTIN No results found for: CHOL, TRIG, HDL, CHOLHDL, VLDL, LDLCALC Lab Results  Component Value Date   TSH 3.765 ***Test methodology is 3rd generation TSH*** 06/15/2007     Therapeutic Level Labs: No results found for: LITHIUM No results found for: CBMZ No results found for: VALPROATE  Current Medications: Current Outpatient Medications  Medication Sig Dispense Refill  . albuterol (PROAIR HFA) 108 (90 BASE) MCG/ACT inhaler Inhale 2 puffs into the lungs every 6 (six) hours as needed for wheezing or shortness of breath.    Marland Kitchen amLODipine (NORVASC) 5 MG tablet Take 5 mg by mouth daily.     Marland Kitchen atorvastatin (LIPITOR) 20 MG tablet Take 20 mg by mouth at bedtime.   11  . escitalopram (LEXAPRO) 20 MG tablet Take 20 mg by mouth every evening.  2  . esomeprazole (NEXIUM) 20 MG capsule Take 20 mg by mouth daily at 12 noon.    . fluticasone furoate-vilanterol (BREO ELLIPTA) 200-25 MCG/INH AEPB Inhale 1 puff into the lungs daily.    . insulin NPH Human (HUMULIN N,NOVOLIN N) 100 UNIT/ML injection Inject 80-100 Units into the skin at bedtime.    . insulin regular (NOVOLIN R,HUMULIN R) 100 units/mL injection Inject 60 Units into the skin 3 (three) times daily before meals.    Marland Kitchen LORazepam (ATIVAN) 1 MG tablet Take 1 mg by mouth at bedtime as needed for anxiety or sleep.     . meclizine (ANTIVERT) 25 MG tablet Take 1 tablet (25 mg total) by mouth 4 (four) times daily as needed for dizziness. 40 tablet 0  . metolazone (ZAROXOLYN) 5 MG tablet Take 5 mg by mouth daily as needed (for swelling).   2  . potassium chloride 20 MEQ TBCR Take 2 twice a day for the next 7 days and than 1 po QD (Patient taking differently: Take 20 mEq by mouth 2 (two) times daily. ) 14 tablet 0  . potassium chloride SA (K-DUR,KLOR-CON) 20 MEQ tablet Take 2 tablets (40 mEq total) by mouth 2 (two) times daily. 12 tablet 0  . propranolol ER (INDERAL LA) 120 MG 24 hr capsule Take 120 mg by mouth at bedtime.     . silver sulfADIAZINE (SILVADENE) 1 % cream Apply 1 application topically daily. 50 g 0  . spironolactone (ALDACTONE) 25 MG tablet Take 1 tablet (25 mg total) by mouth daily. 30 tablet 0  . torsemide  (DEMADEX) 20 MG tablet Take 1 tablet (20 mg total) by mouth 2 (two) times daily. 60 tablet 0   No current facility-administered medications for this visit.     Musculoskeletal: Strength & Muscle Tone: within normal limits Gait & Station: normal Patient leans: N/A  Psychiatric Specialty Exam: ROS  Last menstrual period 08/06/2012.There is no height or weight on file to calculate BMI.  General Appearance: Fairly Groomed  Eye Contact:  Good  Speech:  Clear and Coherent  Volume:  Normal  Mood:  {BHH MOOD:22306}  Affect:  {  Affect (PAA):22687}  Thought Process:  Coherent  Orientation:  Full (Time, Place, and Person)  Thought Content:  Logical  Suicidal Thoughts:  {ST/HT (PAA):22692}  Homicidal Thoughts:  {ST/HT (PAA):22692}  Memory:  Immediate;   Good  Judgement:  {Judgement (PAA):22694}  Insight:  {Insight (PAA):22695}  Psychomotor Activity:  Normal  Concentration:  Concentration: Good and Attention Span: Good  Recall:  Good  Fund of Knowledge:Good  Language: Good  Akathisia:  No  Handed:  Right  AIMS (if indicated):  not done  Assets:  Communication Skills Desire for Improvement  ADL's:  Intact  Cognition: WNL  Sleep:  {BHH GOOD/FAIR/POOR:22877}   Screenings: PHQ2-9     Office Visit from 09/09/2017 in [facility]  PHQ-2 Total Score  1      Assessment and Plan:  Assessment  Plan  The patient demonstrates the following risk factors for suicide: Chronic risk factors for suicide include: {Chronic Risk Factors for XNATFTD:32202542}. Acute risk factors for suicide include: {Acute Risk Factors for HCWCBJS:28315176}. Protective factors for this patient include: {Protective Factors for Suicide HYWV:37106269}. Considering these factors, the overall suicide risk at this point appears to be {Desc; low/moderate/high:110033}. Patient {ACTION; IS/IS SWN:46270350} appropriate for outpatient follow up.   Norman [name], MD 3/18/20201:59 PM

## 2019-02-23 ENCOUNTER — Emergency Department (HOSPITAL_COMMUNITY): Payer: BLUE CROSS/BLUE SHIELD

## 2019-02-23 ENCOUNTER — Emergency Department (HOSPITAL_COMMUNITY)
Admission: EM | Admit: 2019-02-23 | Discharge: 2019-02-26 | Disposition: A | Discharge status: Other Acute Inpatient Hospital | Payer: BLUE CROSS/BLUE SHIELD | Attending: Emergency Medicine | Admitting: Emergency Medicine

## 2019-02-23 ENCOUNTER — Other Ambulatory Visit: Payer: Self-pay

## 2019-02-23 ENCOUNTER — Encounter (HOSPITAL_COMMUNITY): Payer: Self-pay | Admitting: *Deleted

## 2019-02-23 DIAGNOSIS — J45909 Unspecified asthma, uncomplicated: Secondary | ICD-10-CM | POA: Insufficient documentation

## 2019-02-23 DIAGNOSIS — I10 Essential (primary) hypertension: Secondary | ICD-10-CM | POA: Diagnosis not present

## 2019-02-23 DIAGNOSIS — E119 Type 2 diabetes mellitus without complications: Secondary | ICD-10-CM | POA: Diagnosis not present

## 2019-02-23 DIAGNOSIS — J449 Chronic obstructive pulmonary disease, unspecified: Secondary | ICD-10-CM | POA: Insufficient documentation

## 2019-02-23 DIAGNOSIS — Z9049 Acquired absence of other specified parts of digestive tract: Secondary | ICD-10-CM | POA: Diagnosis not present

## 2019-02-23 DIAGNOSIS — E876 Hypokalemia: Secondary | ICD-10-CM | POA: Diagnosis not present

## 2019-02-23 DIAGNOSIS — F23 Brief psychotic disorder: Secondary | ICD-10-CM | POA: Diagnosis not present

## 2019-02-23 DIAGNOSIS — Z7722 Contact with and (suspected) exposure to environmental tobacco smoke (acute) (chronic): Secondary | ICD-10-CM | POA: Insufficient documentation

## 2019-02-23 DIAGNOSIS — R531 Weakness: Secondary | ICD-10-CM

## 2019-02-23 DIAGNOSIS — F329 Major depressive disorder, single episode, unspecified: Secondary | ICD-10-CM | POA: Diagnosis not present

## 2019-02-23 LAB — CBC WITH DIFFERENTIAL/PLATELET
Abs Immature Granulocytes: 0.02 10*3/uL (ref 0.00–0.07)
Basophils Absolute: 0.1 10*3/uL (ref 0.0–0.1)
Basophils Relative: 1 %
Eosinophils Absolute: 0.4 10*3/uL (ref 0.0–0.5)
Eosinophils Relative: 5 %
HCT: 43.9 % (ref 36.0–46.0)
Hemoglobin: 15.1 g/dL — ABNORMAL HIGH (ref 12.0–15.0)
Immature Granulocytes: 0 %
Lymphocytes Relative: 22 %
Lymphs Abs: 1.9 10*3/uL (ref 0.7–4.0)
MCH: 30 pg (ref 26.0–34.0)
MCHC: 34.4 g/dL (ref 30.0–36.0)
MCV: 87.1 fL (ref 80.0–100.0)
Monocytes Absolute: 0.5 10*3/uL (ref 0.1–1.0)
Monocytes Relative: 6 %
Neutro Abs: 5.9 10*3/uL (ref 1.7–7.7)
Neutrophils Relative %: 66 %
Platelets: 309 10*3/uL (ref 150–400)
RBC: 5.04 MIL/uL (ref 3.87–5.11)
RDW: 12.8 % (ref 11.5–15.5)
WBC: 8.9 10*3/uL (ref 4.0–10.5)
nRBC: 0 % (ref 0.0–0.2)

## 2019-02-23 LAB — COMPREHENSIVE METABOLIC PANEL
ALT: 11 U/L (ref 0–44)
AST: 14 U/L — ABNORMAL LOW (ref 15–41)
Albumin: 3.2 g/dL — ABNORMAL LOW (ref 3.5–5.0)
Alkaline Phosphatase: 108 U/L (ref 38–126)
Anion gap: 11 (ref 5–15)
BUN: 8 mg/dL (ref 6–20)
CO2: 28 mmol/L (ref 22–32)
Calcium: 8.8 mg/dL — ABNORMAL LOW (ref 8.9–10.3)
Chloride: 99 mmol/L (ref 98–111)
Creatinine, Ser: 0.85 mg/dL (ref 0.44–1.00)
GFR calc Af Amer: >60 mL/min (ref >60)
GFR calc non Af Amer: >60 mL/min (ref >60)
Glucose, Bld: 178 mg/dL — ABNORMAL HIGH (ref 70–99)
Potassium: 2.7 mmol/L — CL (ref 3.5–5.1)
Sodium: 138 mmol/L (ref 135–145)
Total Bilirubin: 0.9 mg/dL (ref 0.3–1.2)
Total Protein: 6.4 g/dL — ABNORMAL LOW (ref 6.5–8.1)

## 2019-02-23 LAB — URINALYSIS, COMPLETE (UACMP) WITH MICROSCOPIC
Bilirubin Urine: NEGATIVE
Glucose, UA: NEGATIVE mg/dL
Ketones, ur: NEGATIVE mg/dL
Leukocytes,Ua: NEGATIVE
Nitrite: NEGATIVE
Protein, ur: 100 mg/dL — AB
Specific Gravity, Urine: 1.008 (ref 1.005–1.030)
pH: 6 (ref 5.0–8.0)

## 2019-02-23 LAB — BASIC METABOLIC PANEL
Anion gap: 11 (ref 5–15)
BUN: 9 mg/dL (ref 6–20)
CO2: 31 mmol/L (ref 22–32)
Calcium: 8.7 mg/dL — ABNORMAL LOW (ref 8.9–10.3)
Chloride: 94 mmol/L — ABNORMAL LOW (ref 98–111)
Creatinine, Ser: 0.97 mg/dL (ref 0.44–1.00)
GFR calc Af Amer: >60 mL/min (ref >60)
GFR calc non Af Amer: >60 mL/min (ref >60)
Glucose, Bld: 448 mg/dL — ABNORMAL HIGH (ref 70–99)
Potassium: 3.5 mmol/L (ref 3.5–5.1)
Sodium: 136 mmol/L (ref 135–145)

## 2019-02-23 LAB — CBG MONITORING, ED
Glucose-Capillary: 448 mg/dL — ABNORMAL HIGH (ref 70–99)
Glucose-Capillary: 556 mg/dL (ref 70–99)
Glucose-Capillary: 574 mg/dL (ref 70–99)
Glucose-Capillary: 392 mg/dL — ABNORMAL HIGH (ref 70–99)

## 2019-02-23 LAB — ETHANOL: Alcohol, Ethyl (B): <10 mg/dL (ref <10)

## 2019-02-23 LAB — RAPID URINE DRUG SCREEN, HOSP PERFORMED
Amphetamines: NOT DETECTED
Barbiturates: NOT DETECTED
Benzodiazepines: NOT DETECTED
Cocaine: NOT DETECTED
Opiates: NOT DETECTED
Tetrahydrocannabinol: NOT DETECTED

## 2019-02-23 LAB — SALICYLATE LEVEL: Salicylate Lvl: <7 mg/dL (ref 2.8–30.0)

## 2019-02-23 LAB — AMMONIA: Ammonia: 23 umol/L (ref 9–35)

## 2019-02-23 LAB — MAGNESIUM: Magnesium: 1.8 mg/dL (ref 1.7–2.4)

## 2019-02-23 LAB — ACETAMINOPHEN LEVEL: Acetaminophen (Tylenol), Serum: <10 ug/mL — ABNORMAL LOW (ref 10–30)

## 2019-02-23 IMAGING — CT CT HEAD WITHOUT CONTRAST
3 series · 16 of 45 positions shown, 19 images · non-contrast
Comparison: [DATE]

CLINICAL DATA: Generalized weakness.  Hallucination

EXAM:
CT HEAD WITHOUT CONTRAST
TECHNIQUE: Contiguous axial images were obtained from the base of the skull
through the vertex without intravenous contrast.

[Series 2: head trauma wo · axial · 0.46mm/px · z∈[+665,+780]mm · 10 of 28 slices shown, 13 images]
[im 3/28  brain]
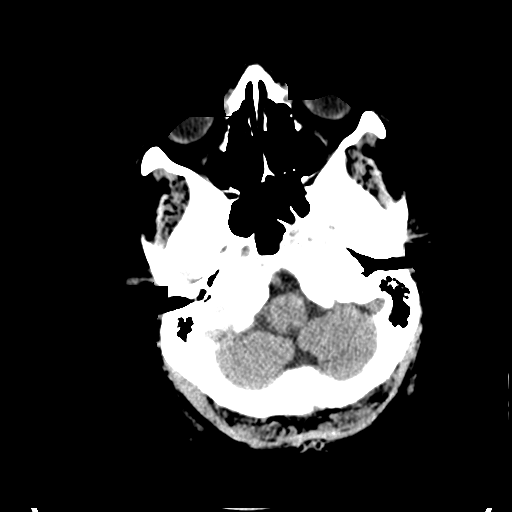
[im 3/28  bone]
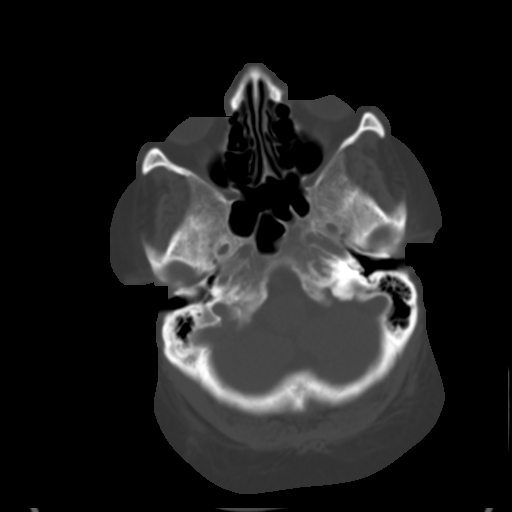
[im 5/28  brain]
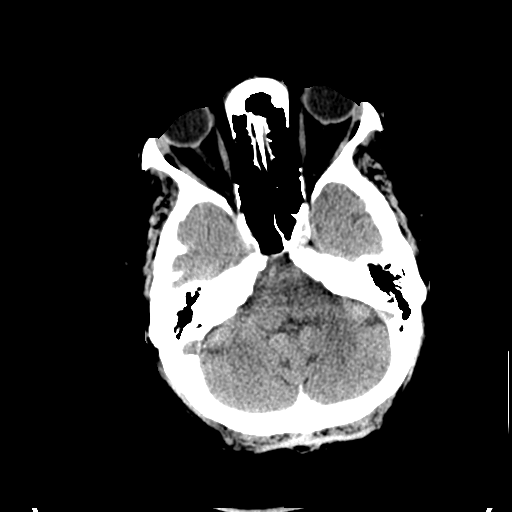
[im 8/28  brain]
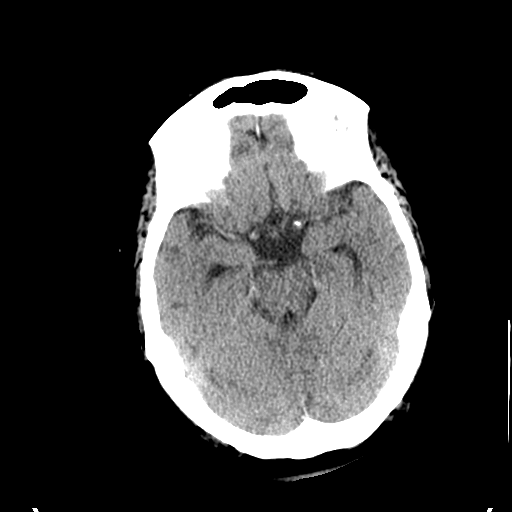
[im 11/28  brain]
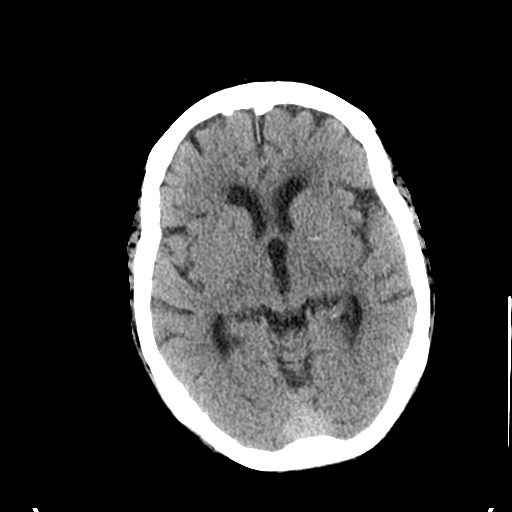
[im 13/28  brain]
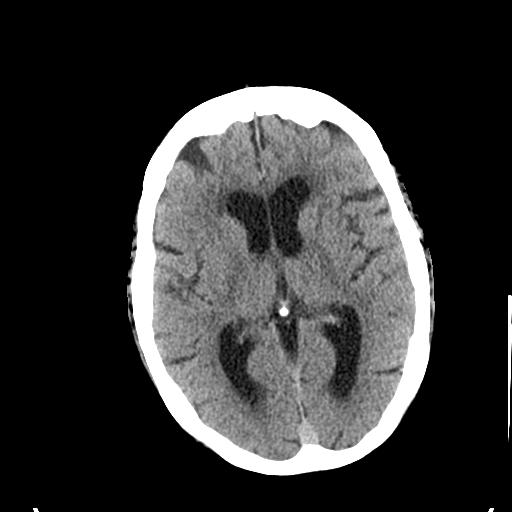
[im 13/28  bone]
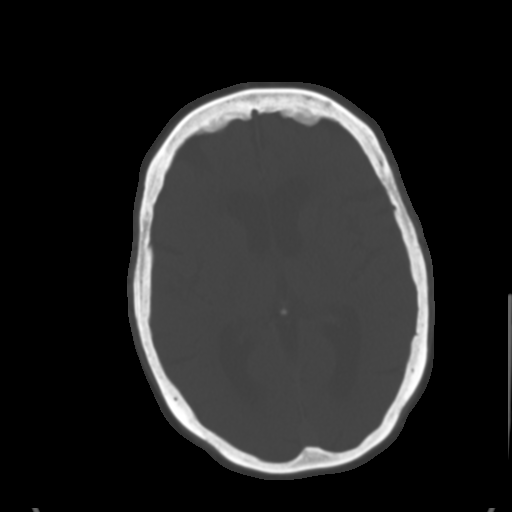
[im 16/28  brain]
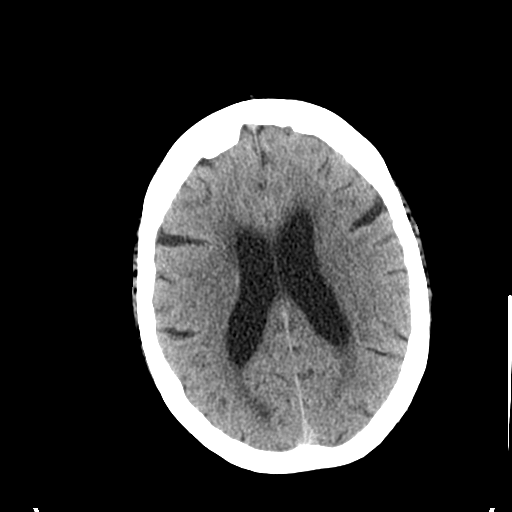
[im 18/28  brain]
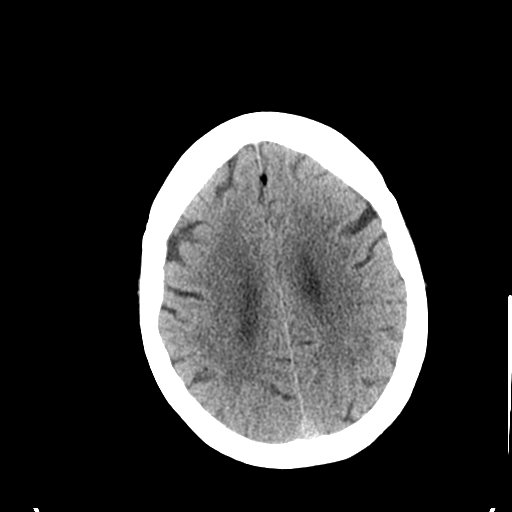
[im 21/28  brain]
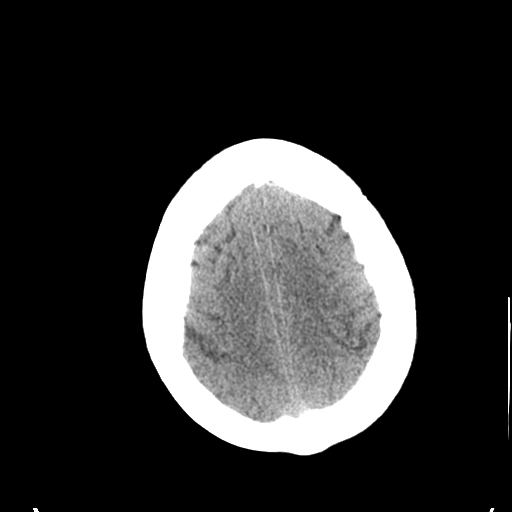
[im 24/28  brain]
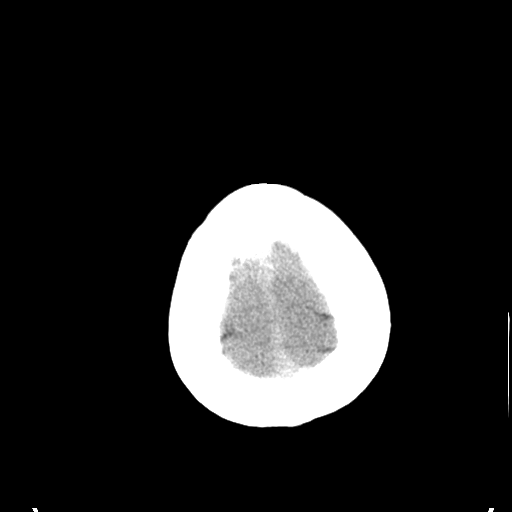
[im 24/28  bone]
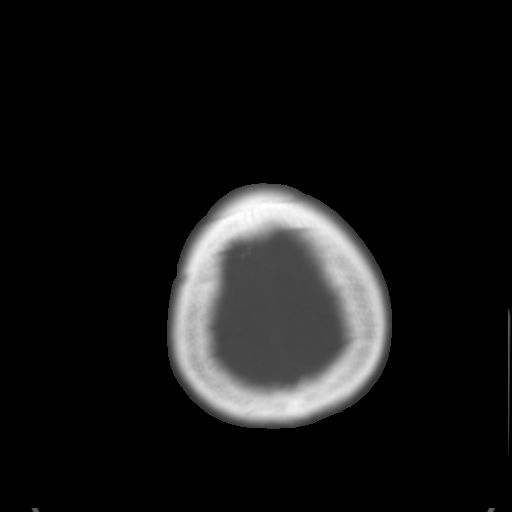
[im 26/28  brain]
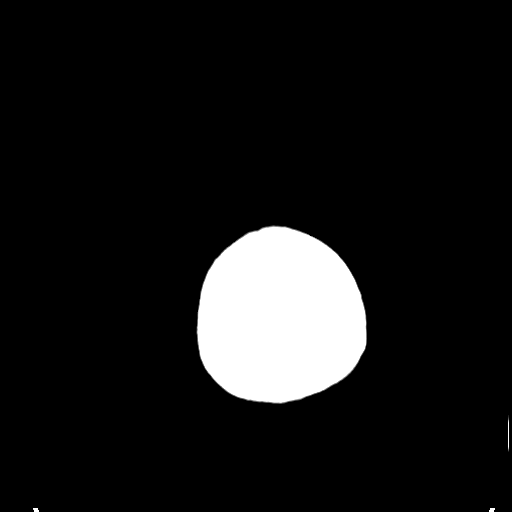

[Series 4: coronal soft tissue · coronal · 0.34mm/px · 3 of 70 slices shown]
[im 24/70  brain]
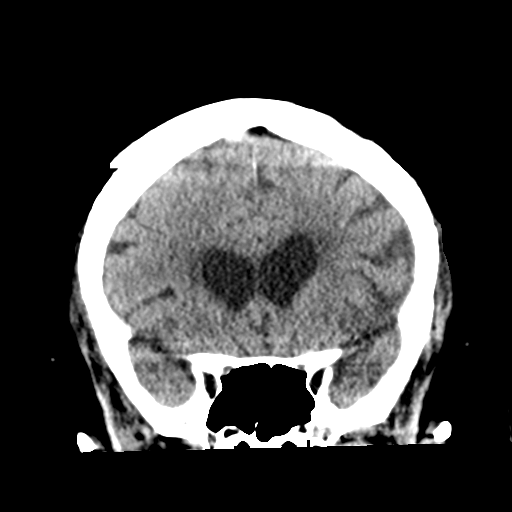
[im 31/70  brain]
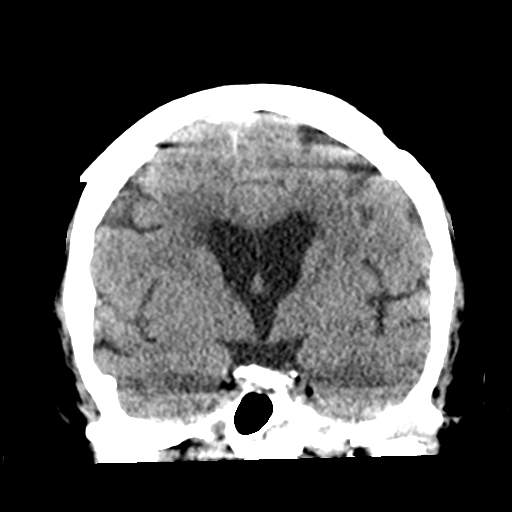
[im 39/70  brain]
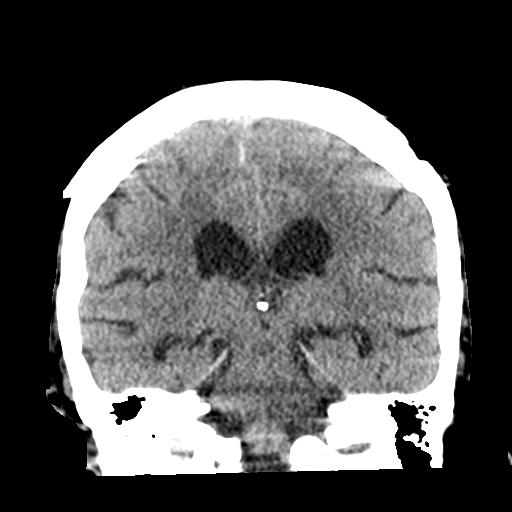

[Series 5: sagittal soft tissue · sagittal · 0.32mm/px · 3 of 54 slices shown]
[im 18/54  brain]
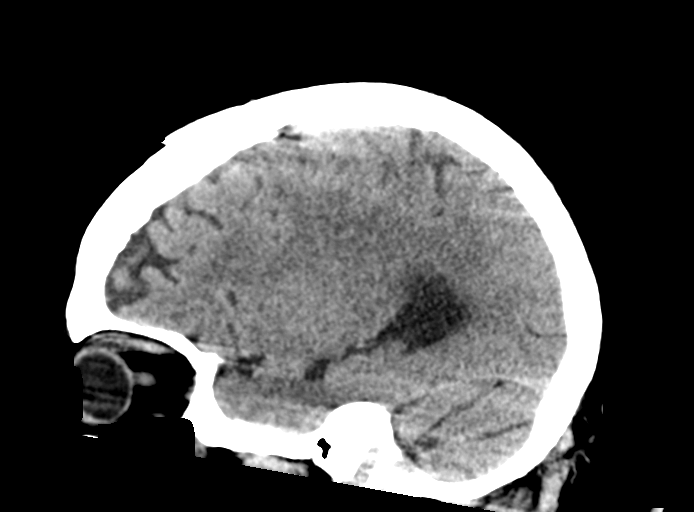
[im 27/54  brain]
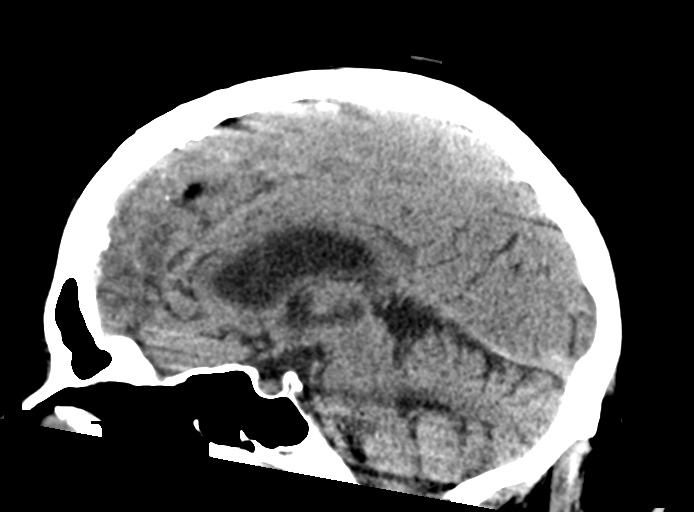
[im 36/54  brain]
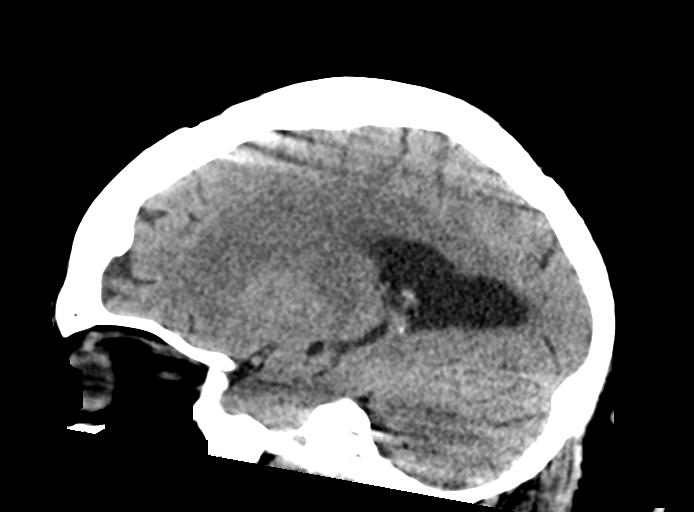

[16 of 45 positions shown; findings below may reference images not displayed]

FINDINGS: Brain: No evidence of acute infarction, hemorrhage, hydrocephalus,
extra-axial collection or mass lesion/mass effect. Age advanced
cerebral volume loss. Mild periventricular low-density attributed to
chronic small vessel ischemia.

Vascular: No hyperdense vessel or unexpected calcification.

Skull: Normal. Negative for fracture or focal lesion.

Sinuses/Orbits: No acute finding.

Other: Motion degraded at the vertex
IMPRESSION: 1. No acute finding.
2. Age advanced volume loss.  Mild chronic small vessel ischemia.
3. Motion degraded at the vertex.

## 2019-02-23 IMAGING — DX CHEST - 2 VIEW
2 series · 2 of 2 positions shown · non-contrast
Comparison: [DATE]

CLINICAL DATA: Weakness

EXAM:
CHEST - 2 VIEW

[chest lat (1 of 2)]
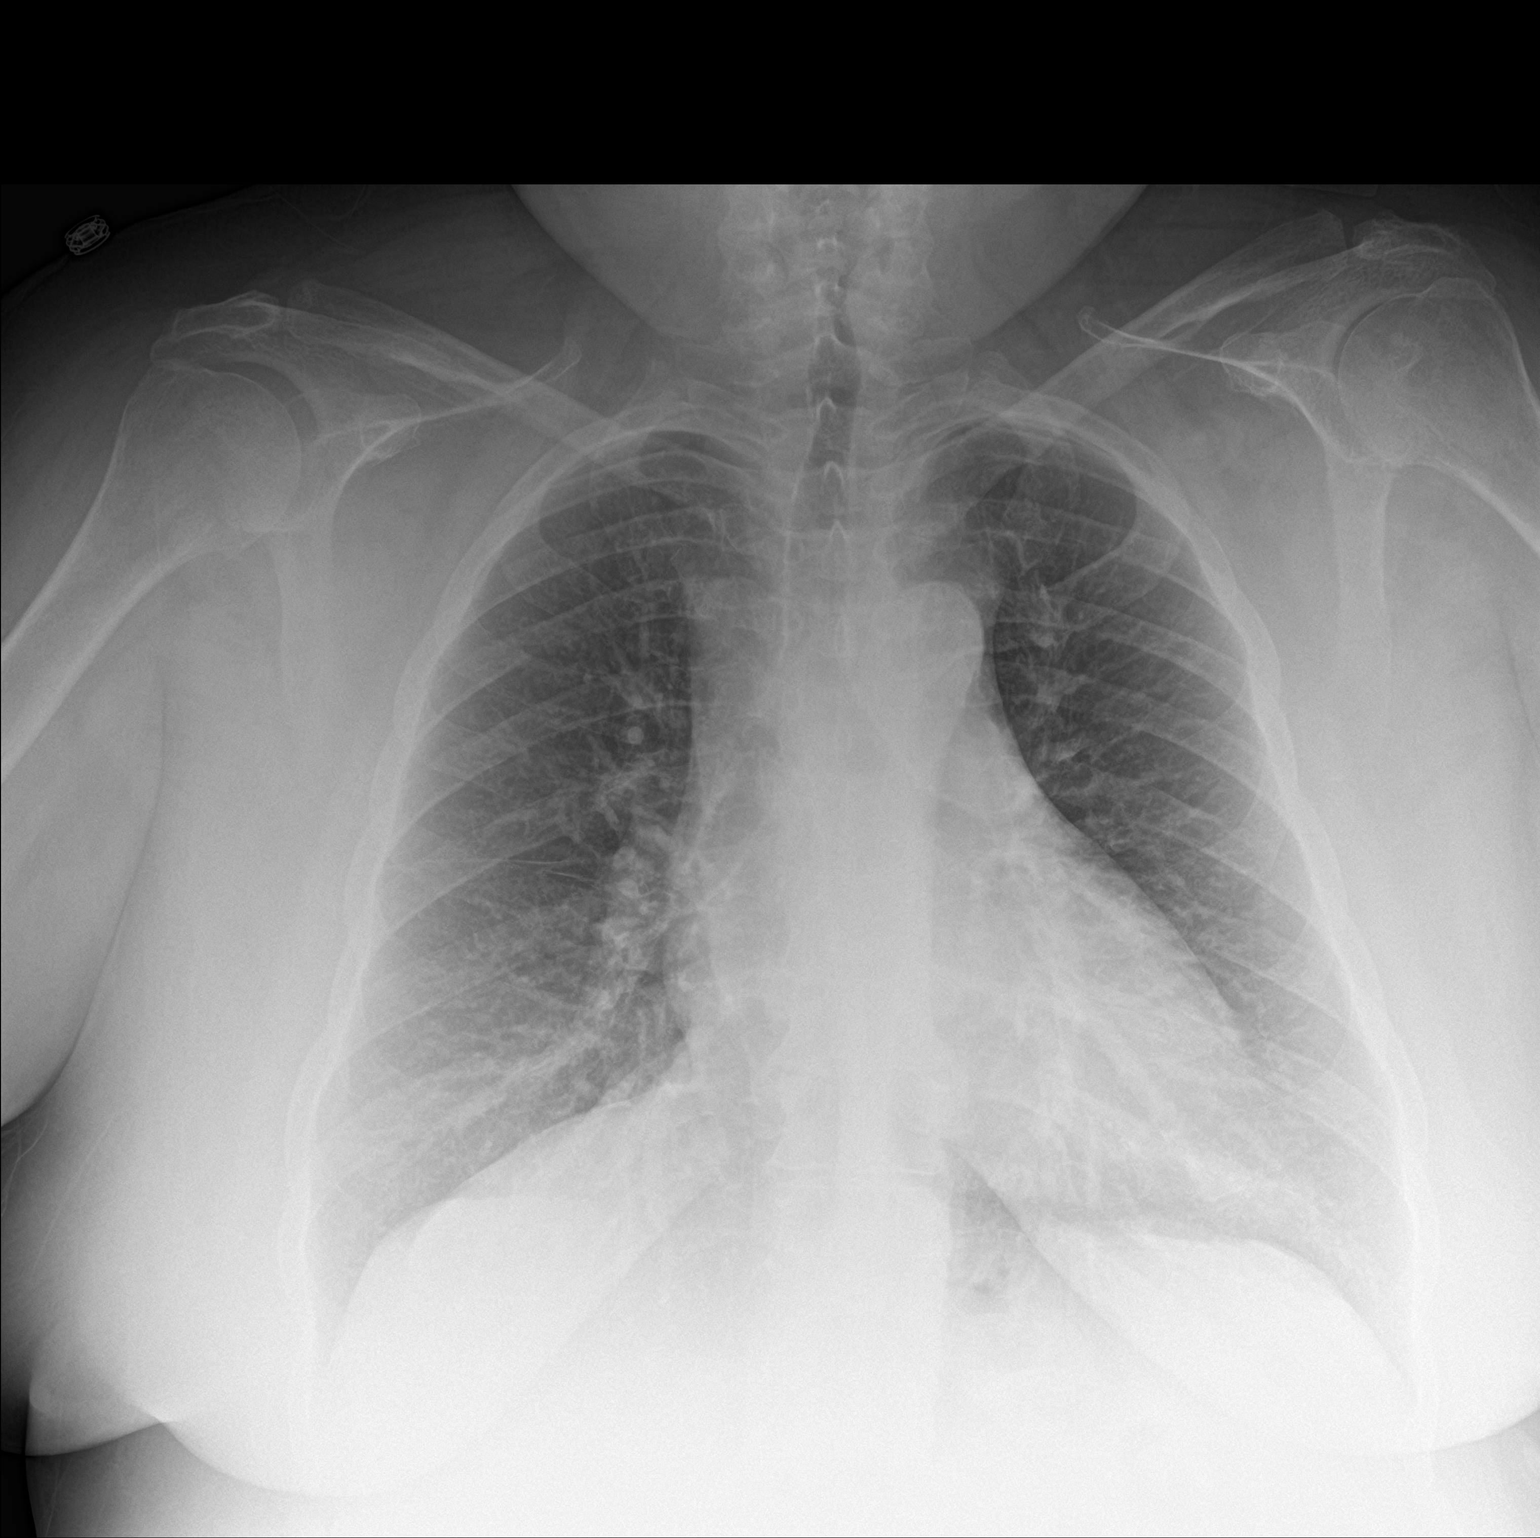

[chest lat (2 of 2)]
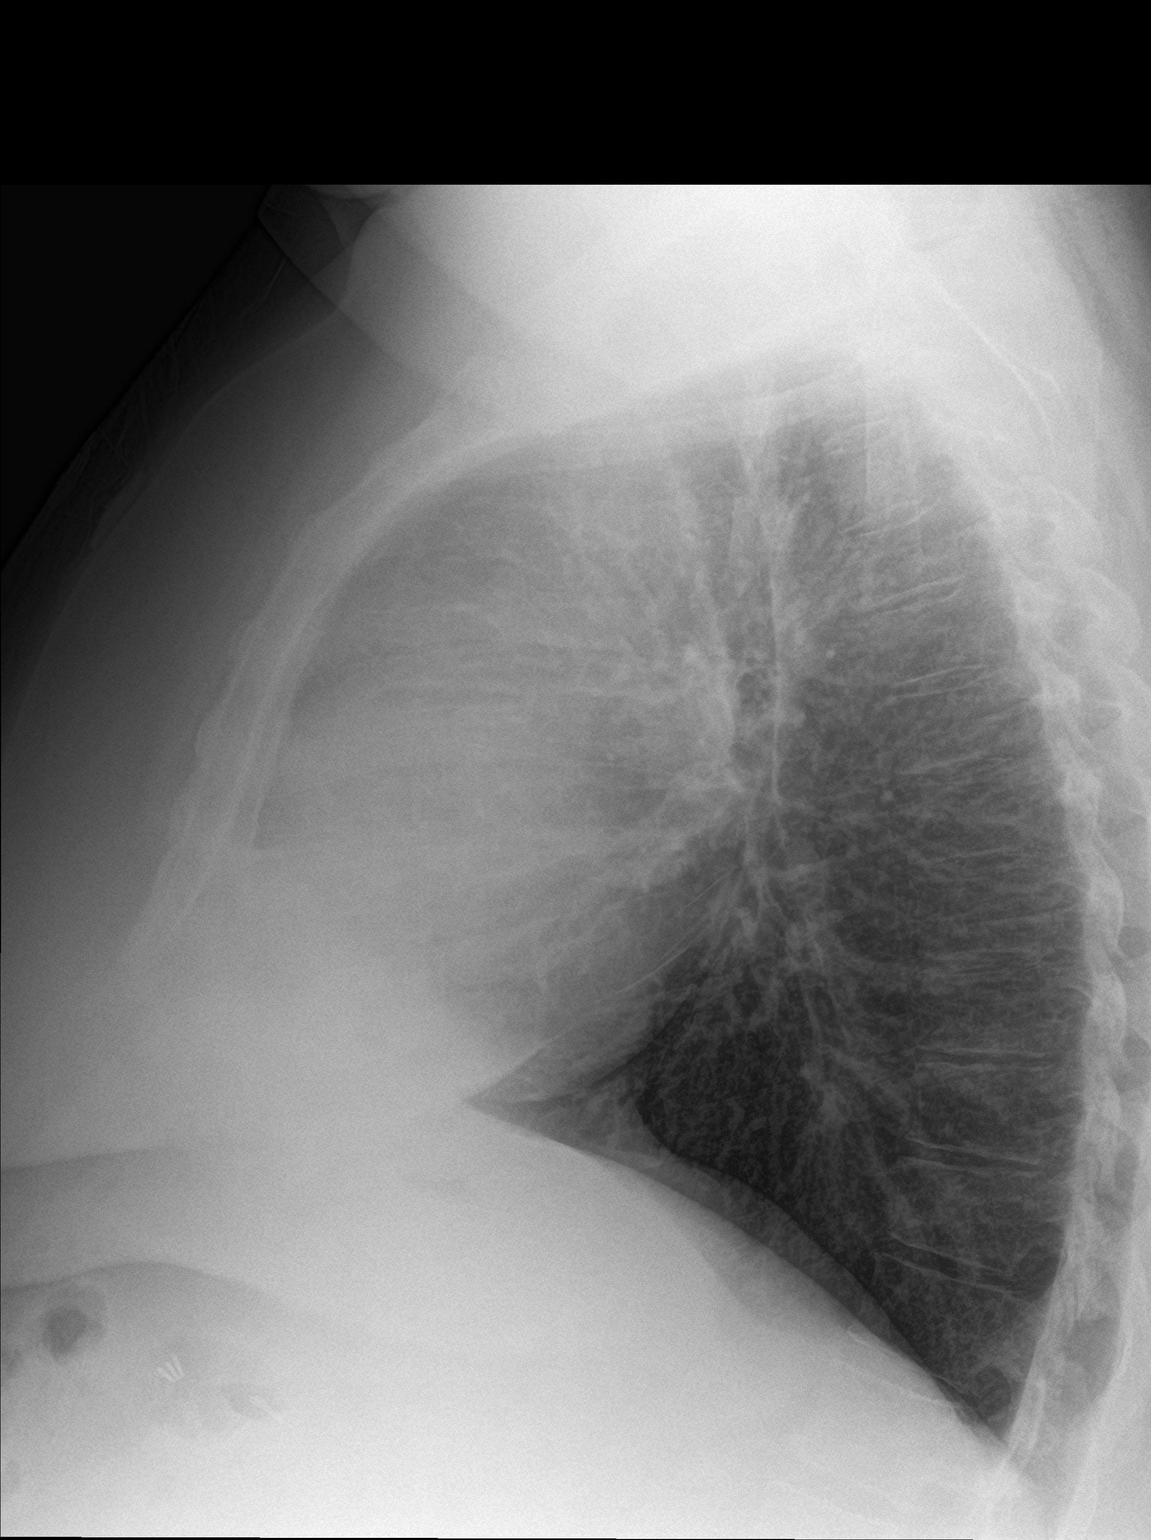

[2 of 2 positions shown; findings below may reference images not displayed]

FINDINGS: Normal heart size and mediastinal contours. Chronic airway cuffing
in this patient with COPD. No acute infiltrate or edema. No effusion
or pneumothorax. No acute osseous findings.
IMPRESSION: Stable from prior.  No acute finding.

## 2019-02-23 MED ORDER — INSULIN GLARGINE 100 UNIT/ML ~~LOC~~ SOLN: 40.0000 [IU] | Freq: Every day | SUBCUTANEOUS | Status: DC

## 2019-02-23 MED ORDER — METOLAZONE 5 MG PO TABS
5.0000 mg | ORAL_TABLET | Freq: Every day | ORAL | Status: DC | PRN
  Filled 2019-02-23: qty 1

## 2019-02-23 MED ORDER — PROPRANOLOL HCL ER 120 MG PO CP24
120.0000 mg | ORAL_CAPSULE | Freq: Every day | ORAL | Status: DC
  Administered 2019-02-23 – 2019-02-26 (×3): 120 mg via ORAL
  Filled 2019-02-23 (×5): qty 1

## 2019-02-23 MED ORDER — INSULIN ASPART 100 UNIT/ML ~~LOC~~ SOLN
0.0000 [IU] | Freq: Three times a day (TID) | SUBCUTANEOUS | Status: DC
  Administered 2019-02-23: 15 [IU] via SUBCUTANEOUS
  Filled 2019-02-23: qty 1

## 2019-02-23 MED ORDER — POTASSIUM CHLORIDE 10 MEQ/100ML IV SOLN
10.0000 meq | INTRAVENOUS | Status: AC
  Administered 2019-02-23 (×2): 10 meq via INTRAVENOUS
  Filled 2019-02-23 (×2): qty 100

## 2019-02-23 MED ORDER — TORSEMIDE 20 MG PO TABS
20.0000 mg | ORAL_TABLET | Freq: Two times a day (BID) | ORAL | Status: DC
  Administered 2019-02-23 – 2019-02-26 (×7): 20 mg via ORAL
  Filled 2019-02-23 (×11): qty 1

## 2019-02-23 MED ORDER — INSULIN GLARGINE 100 UNIT/ML ~~LOC~~ SOLN
40.0000 [IU] | SUBCUTANEOUS | Status: DC
  Administered 2019-02-23: 40 [IU] via SUBCUTANEOUS
  Filled 2019-02-23 (×3): qty 0.4

## 2019-02-23 MED ORDER — AMLODIPINE BESYLATE 5 MG PO TABS
5.0000 mg | ORAL_TABLET | Freq: Every day | ORAL | Status: DC
  Administered 2019-02-23 – 2019-02-26 (×4): 5 mg via ORAL
  Filled 2019-02-23 (×4): qty 1

## 2019-02-23 MED ORDER — INSULIN ASPART 100 UNIT/ML ~~LOC~~ SOLN
5.0000 [IU] | Freq: Three times a day (TID) | SUBCUTANEOUS | Status: DC
  Administered 2019-02-23: 5 [IU] via SUBCUTANEOUS
  Filled 2019-02-23: qty 1

## 2019-02-23 MED ORDER — MAGNESIUM SULFATE 2 GM/50ML IV SOLN
2.0000 g | Freq: Once | INTRAVENOUS | Status: AC
  Administered 2019-02-23: 2 g via INTRAVENOUS
  Filled 2019-02-23: qty 50

## 2019-02-23 MED ORDER — PANTOPRAZOLE SODIUM 40 MG PO TBEC
40.0000 mg | DELAYED_RELEASE_TABLET | Freq: Every day | ORAL | Status: DC
  Administered 2019-02-23 – 2019-02-26 (×4): 40 mg via ORAL
  Filled 2019-02-23 (×4): qty 1

## 2019-02-23 MED ORDER — INSULIN ASPART 100 UNIT/ML ~~LOC~~ SOLN
15.0000 [IU] | Freq: Three times a day (TID) | SUBCUTANEOUS | Status: DC
  Administered 2019-02-23 – 2019-02-24 (×2): 15 [IU] via SUBCUTANEOUS
  Filled 2019-02-23 (×2): qty 1

## 2019-02-23 MED ORDER — INSULIN ASPART 100 UNIT/ML ~~LOC~~ SOLN
0.0000 [IU] | Freq: Every day | SUBCUTANEOUS | Status: DC
  Administered 2019-02-23: 5 [IU] via SUBCUTANEOUS
  Administered 2019-02-26: 2 [IU] via SUBCUTANEOUS
  Filled 2019-02-23 (×2): qty 1

## 2019-02-23 MED ORDER — POTASSIUM CHLORIDE 20 MEQ/15ML (10%) PO SOLN: 40.0000 meq | Freq: Two times a day (BID) | ORAL | 2 refills | Status: DC

## 2019-02-23 MED ORDER — INSULIN ASPART 100 UNIT/ML ~~LOC~~ SOLN
0.0000 [IU] | Freq: Three times a day (TID) | SUBCUTANEOUS | Status: DC
  Administered 2019-02-23: 20 [IU] via SUBCUTANEOUS
  Administered 2019-02-24: 15 [IU] via SUBCUTANEOUS
  Administered 2019-02-24: 20 [IU] via SUBCUTANEOUS
  Administered 2019-02-25: 11 [IU] via SUBCUTANEOUS
  Administered 2019-02-25: 8 [IU] via SUBCUTANEOUS
  Administered 2019-02-26: 3 [IU] via SUBCUTANEOUS
  Administered 2019-02-26: 2 [IU] via SUBCUTANEOUS
  Filled 2019-02-23 (×6): qty 1

## 2019-02-23 MED ORDER — POTASSIUM CHLORIDE CRYS ER 20 MEQ PO TBCR
40.0000 meq | EXTENDED_RELEASE_TABLET | Freq: Once | ORAL | Status: AC
  Administered 2019-02-23: 40 meq via ORAL
  Filled 2019-02-23: qty 2

## 2019-02-23 NOTE — ED Notes (Signed)
Date and time results received: 02/23/19  (use smartphrase ".now" to insert current time)  Test: K+  Critical Value: 2.7  Name of Provider Notified: DR. Dayna Barker  Orders Received? Or Actions Taken?: Dr notified

## 2019-02-23 NOTE — ED Notes (Signed)
Spoke to Diabetic Coordinator.  Order to give 20 units novolog and CM diet order placed.

## 2019-02-23 NOTE — Progress Notes (Addendum)
Inpatient Diabetes Program Recommendations  AACE/ADA: New Consensus Statement on Inpatient Glycemic Control  Target Ranges:  Prepandial:   less than 140 mg/dL      Peak postprandial:   less than 180 mg/dL (1-2 hours)      Critically ill patients:  140 - 180 mg/dL   Results for [name], [name] (MRN [id number]) as of 02/23/2019 08:37  Ref. Range 02/23/2019 05:07  Glucose Latest Ref Range: 70 - 99 mg/dL 178 (H)   Review of Glycemic Control  Diabetes history: DM2 Outpatient Diabetes medications: NPH 80 units QHS, Regular 60 units TID with meals Current orders for Inpatient glycemic control: None; in Emergency Room  Inpatient Diabetes Program Recommendations:   Insulin - Basal: Please consider ordering Lantus 40 units QHS (based on 136 kg x 0.3 units).  Correction (SSI): Please consider ordering CBGs ACHS with Novolog 0-15 units TID with meals and Novolog 0-5 units QHS.  Insulin - Meal Coverage: Please consider ordering Novolog 5 units TID with meals for meal coverage if patient eats at least 50% of meals.  NOTE: Noted consult for Diabetes Coordinator for recommendations. Per chart review, patient use to see Dr. Hartford Poli (Endocrinologist) and last office note on 12/07/17. Per office notes by Dr. Hartford Poli, patient use to take Humulin R U500 insulin but switched to NPH and Regular due to cost of Humulin R U500.  Per current home medication list, patient takes NPH 80-100 units QHS and Regular 60 units TID with meals.  Patient seen PCP last on 02/09/19 for UTI.  Spoke with patient over the phone and she confirms doses of NPH and Regular. Patient reports that she consistently takes NPH and Regular insulin as prescribed but notes that she does not take the Regular insulin if she does not eat. Patient reports that her glucose is "crazy, all over" and notes that highest glucose was 503 mg/dl over the past 1 week. Informed patient that Lantus and Novolog will be requested while inpatient and that glucose trends  would be followed by Inpatient Diabetes team and further recommendations made based on glucose trends if needed. Patient verbalized understanding of information discussed and states that she has not further questions at this time.   Addendum 02/23/19@9 :23-Communicated with Dr. Dayna Barker. Given verbal order to place order for Lantus 40 units QHS, CBGs ACHS, Novolog 0-15 units TID with meals, Novolog 0-5 units QHS, and Novolog 5 units TID with meals for meal coverage. Placed orders.  Thanks, Barnie Alderman, RN, MSN, CDE Diabetes Coordinator Inpatient Diabetes Program [phone] (Team Pager from 8am to 5pm)

## 2019-02-23 NOTE — BH Assessment (Signed)
Tele Assessment Note   Patient Name: [name] MRN: [id number] Referring Physician: Dr. Merrily Pew Location of Patient: [facility] Location of Provider: [place] is an 58 y.o. female.  -Clinician reviewed note by Dr. Dayna Barker.  [name] is a 58 y.o. female who presents for generalized weakness and hallucinations.  Patient's husband accompanies her and states that she is increased weakness.  Recently with some falls but was really has not concern is the last 3 to 4 days she has been hearing voices and seeing people.  Tonight she got really worked up and will try to run out of the house naked.  She never done this before.  No recent illnesses.  Patient is pleasant to talk to.  She answers questions logically and coherently.  She has good eye contact.  Patient does admit to poor memory and is unsure of her medications.    Patient's husband, [name], came in during the last of the interview.  He said "she has been talking crazy" last night.  She tried to get out of the house naked.  Patient was brought to [place] by husband in private vehicle.    Patient says she has had some SI but has no plan or intention.  She says, "I would never do that."  Patient has no previous attempts.  Patient denies any HI or ETOH/illicit drug use.  Patient says that she has been seeing "old people" in the home for the last few days or so.  She said at first it scared her but now she is used to it.  The old people will talk to her.  No commands however.  Pt used to work in an retirement home.  Patient has no previous inpatient or outpatient care.  She admits to depression and anxiety.  She says she feels hopeless and unmotivated to engage daily. She does report weakness in her legs and has had some falls recently.  -Clinician discussed patient care with Patriciaann Clan, PA.  He recommends inpatient gero psych placement.  Clinician informed Dr. Dayna Barker of disposition.   TTS to seek placement.  Diagnosis: F23 Brief psychotic d/o  Past Medical History:  Past Medical History:  Diagnosis Date  . Asthma   . COPD (chronic obstructive pulmonary disease) ([place])   . Depression   . Diabetes mellitus   . Diastolic dysfunction 04/06/7781  . Hypertension   . Prolonged QT interval 05/14/2015    Past Surgical History:  Procedure Laterality Date  . CATARACT EXTRACTION    . CESAREAN SECTION    . CHOLECYSTECTOMY      Family History:  Family History  Problem Relation Age of Onset  . Stroke Mother   . Diabetes Father   . Heart failure Father   . Hypertension Father   . Diabetes Sister   . Heart failure Sister   . Hypertension Sister   . Stroke Sister   . Cancer Other   . Stroke Sister     Social History:  reports that she is a non-smoker but has been exposed to tobacco smoke. She has never used smokeless tobacco. She reports that she does not drink alcohol or use drugs.  Additional Social History:  Alcohol / Drug Use Pain Medications: See PTA medication list Prescriptions: See PTA medication list Over the Counter: Nexium History of alcohol / drug use?: No history of alcohol / drug abuse  CIWA: CIWA-Ar BP: (!) 164/81 Pulse Rate: 66 COWS:  Allergies:  Allergies  Allergen Reactions  . Benicar [Olmesartan] Swelling  . Codeine Other (See Comments)    Confusion   . Sulfa Antibiotics Swelling    Whole face swells  . Trulicity [Dulaglutide] Diarrhea    Home Medications: (Not in a hospital admission)   OB/GYN Status:  Patient's last menstrual period was 08/06/2012.  General Assessment Data Location of Assessment: [facility] TTS Assessment: In system Is this a Tele or Face-to-Face Assessment?: Tele Assessment Is this an Initial Assessment or a Re-assessment for this encounter?: Initial Assessment Patient Accompanied by:: N/A Language Other than English: No Living Arrangements: Other (Comment)(Pt lives with husband.) What gender do you identify  as?: Female Marital status: Married [place] name: O'Neil Pregnancy Status: No Living Arrangements: Spouse/significant other Can pt return to current living arrangement?: Yes Admission Status: Voluntary Is patient capable of signing voluntary admission?: Yes Referral Source: Self/Family/Friend Insurance type: BC/BS     Crisis Care Plan Living Arrangements: Spouse/significant other Name of Psychiatrist: None Name of Therapist: None  Education Status Is patient currently in school?: No Is the patient employed, unemployed or receiving disability?: Receiving disability income  Risk to self with the past 6 months Suicidal Ideation: Yes-Currently Present Has patient been a risk to self within the past 6 months prior to admission? : No Suicidal Intent: No Has patient had any suicidal intent within the past 6 months prior to admission? : No Is patient at risk for suicide?: No Suicidal Plan?: No Has patient had any suicidal plan within the past 6 months prior to admission? : No Access to Means: No What has been your use of drugs/alcohol within the last 12 months?: Denies Previous Attempts/Gestures: No How many times?: 0 Other Self Harm Risks: None Triggers for Past Attempts: None known Intentional Self Injurious Behavior: None Family Suicide History: No Recent stressful life event(s): Recent negative physical changes Persecutory voices/beliefs?: No Depression: Yes Depression Symptoms: Despondent, Loss of interest in usual pleasures, Feeling worthless/self pity, Insomnia Substance abuse history and/or treatment for substance abuse?: No Suicide prevention information given to non-admitted patients: Not applicable  Risk to Others within the past 6 months Homicidal Ideation: No Does patient have any lifetime risk of violence toward others beyond the six months prior to admission? : No Thoughts of Harm to Others: No Current Homicidal Intent: No Current Homicidal Plan: No Access to  Homicidal Means: No Identified Victim: No one History of harm to others?: No Assessment of Violence: None Noted Violent Behavior Description: None reported Does patient have access to weapons?: Yes (Comment)(guns are secured) Criminal Charges Pending?: No Does patient have a court date: No Is patient on probation?: No  Psychosis Hallucinations: Auditory, Visual(Voices saying things, no commands; seeing old people) Delusions: None noted  Mental Status Report Appearance/Hygiene: Disheveled, In hospital gown Eye Contact: Good Motor Activity: Freedom of movement, Unsteady Speech: Logical/coherent Level of Consciousness: Alert Mood: Pleasant, Anxious Affect: Anxious, Depressed Anxiety Level: Moderate Panic attack frequency: One episode 4 days ago Most recent panic attack: 4 days ago Thought Processes: Coherent, Relevant Judgement: Unimpaired Orientation: Person, Place, Situation Obsessive Compulsive Thoughts/Behaviors: None  Cognitive Functioning Concentration: Poor Memory: Recent Impaired, Remote Intact Is patient IDD: No Insight: Fair Impulse Control: Poor Appetite: Poor Have you had any weight changes? : No Change Sleep: Decreased Total Hours of Sleep: (<4H/D) Vegetative Symptoms: Staying in bed, Decreased grooming  ADLScreening [facility] Assessment Services) Patient's cognitive ability adequate to safely complete daily activities?: Yes Patient able to express need for assistance with ADLs?:  Yes Independently performs ADLs?: Yes (appropriate for developmental age)  Prior Inpatient Therapy Prior Inpatient Therapy: No  Prior Outpatient Therapy Prior Outpatient Therapy: No Does patient have an ACCT team?: No Does patient have Intensive In-House Services?  : No Does patient have [facility] services? : No Does patient have P4CC services?: No  ADL Screening (condition at time of admission) Patient's cognitive ability adequate to safely complete daily activities?: Yes Is the  patient deaf or have difficulty hearing?: No Does the patient have difficulty seeing, even when wearing glasses/contacts?: Yes(Will have some vision problems at times.) Does the patient have difficulty concentrating, remembering, or making decisions?: Yes Patient able to express need for assistance with ADLs?: Yes Does the patient have difficulty dressing or bathing?: Yes Independently performs ADLs?: Yes (appropriate for developmental age) Does the patient have difficulty walking or climbing stairs?: Yes Weakness of Legs: Both Weakness of Arms/Hands: Both       Abuse/Neglect Assessment (Assessment to be complete while patient is alone) Abuse/Neglect Assessment Can Be Completed: Yes Physical Abuse: Denies Verbal Abuse: Yes, past (Comment) Sexual Abuse: Denies Exploitation of patient/patient's resources: Denies Self-Neglect: Denies     Regulatory affairs officer (For Healthcare) Does Patient Have a Medical Advance Directive?: No Would patient like information on creating a medical advance directive?: No - Patient declined          Disposition:  Disposition Initial Assessment Completed for this Encounter: Yes Patient referred to: Other (Comment)(Geropsych bed)  This service was provided via telemedicine using a 2-way, interactive audio and video technology.  Names of all persons participating in this telemedicine service and their role in this encounter. Name: [name] Role: patient  Name: Casper Harrison Role: husband  Name: Curlene Dolphin, M.S. LCAS QP Role: clinician  Name:  Role:     Raymondo Band 02/23/2019 7:27 AM

## 2019-02-23 NOTE — Progress Notes (Signed)
Pt. meets criteria for inpatient treatment per Patriciaann Clan, PA.  No appropriate beds available at [facility]. Referred out to the following hospitals:  [facility]  [facility]     Disposition CSW will continue to follow for placement.  Areatha Keas. Judi Cong, MSW, [place] Disposition Clinical Social Work [phone] (cell) [phone] (office)

## 2019-02-23 NOTE — ED Provider Notes (Addendum)
Emergency Department Provider Note   I have reviewed the triage vital signs and the nursing notes.   HISTORY  Chief Complaint Weakness   HPI [name] is a 58 y.o. female who presents for generalized weakness and hallucinations.  Patient's husband accompanies her and states that she is increased weakness.  Recently with some falls but was really has not concern is the last 3 to 4 days she has been here in voices and seeing people.  Tonight she got really worked up and will try to run out of the house naked.  She never denied had this before.  No recent illnesses.  Taking medications as prescribed.  No fevers.  No abdominal pain.  No cough, nausea, vomiting, diarrhea or other associated symptoms. No other associated or modifying symptoms.    Past Medical History:  Diagnosis Date  . Asthma   . COPD (chronic obstructive pulmonary disease) ([place])   . Depression   . Diabetes mellitus   . Diastolic dysfunction 06/12/9380  . Hypertension   . Prolonged QT interval 05/14/2015    Patient Active Problem List   Diagnosis Date Noted  . Diastolic dysfunction 01/75/1025  . Prolonged QT interval 05/14/2015  . Palpitations 07/23/2014  . Generalized weakness 07/23/2014  . Hypokalemia 07/22/2014  . Diabetes mellitus ([place]) 07/22/2014  . Hypertension 07/22/2014  . COPD (chronic obstructive pulmonary disease) ([place]) 07/22/2014  . Depression 07/22/2014    Past Surgical History:  Procedure Laterality Date  . CATARACT EXTRACTION    . CESAREAN SECTION    . CHOLECYSTECTOMY      Current Outpatient Rx  . Order #: [id number] Class: Historical Med  . Order #: [id number] Class: Historical Med  . Order #: [id number] Class: Historical Med  . Order #: [id number] Class: Historical Med  . Order #: [id number] Class: Historical Med  . Order #: [id number] Class: Historical Med  . Order #: [id number] Class: Historical Med  . Order #: [id number] Class: Historical Med  . Order #: [id number] Class: Historical Med   . Order #: [id number] Class: Historical Med  . Order #: [id number] Class: Historical Med  . Order #: [id number] Class: Historical Med  . Order #: [id number] Class: Historical Med  . Order #: [id number] Class: Normal  . Order #: [id number] Class: Historical Med  . Order #: [id number] Class: Historical Med  . Order #: [id number] Class: Historical Med  . Order #: [id number] Class: Historical Med  . Order #: [id number] Class: Historical Med  . Order #: [id number] Class: Historical Med  . Order #: [id number] Class: Historical Med  . Order #: [id number] Class: Normal  . Order #: [id number] Class: Historical Med  . Order #: [id number] Class: Normal  . Order #: [id number] Class: Normal    Allergies Benicar [olmesartan]; Codeine; Sulfa antibiotics; and Trulicity [dulaglutide]  Family History  Problem Relation Age of Onset  . Stroke Mother   . Diabetes Father   . Heart failure Father   . Hypertension Father   . Diabetes Sister   . Heart failure Sister   . Hypertension Sister   . Stroke Sister   . Cancer Other   . Stroke Sister     Social History Social History   Tobacco Use  . Smoking status: Passive Smoke Exposure - Never Smoker  . Smokeless tobacco: Never Used  Substance Use Topics  . Alcohol use: No    Alcohol/week: 0.0 standard drinks  . Drug use: No    Review of Systems  All other systems negative except as documented in the HPI. All pertinent positives and negatives as reviewed in the HPI. ____________________________________________   PHYSICAL  EXAM:  VITAL SIGNS: ED Triage Vitals  Enc Vitals Group     BP 02/23/19 0345 (!) 147/63     Pulse Rate 02/23/19 0345 70     Resp 02/23/19 0345 20     Temp 02/23/19 0345 97.9 F (36.6 C)     Temp Source 02/23/19 0345 Oral     SpO2 02/23/19 0345 96 %     Weight 02/23/19 0344 300 lb (136.1 kg)     Height 02/23/19 0344 5\' 1"  (1.549 m)     Head Circumference --      Peak Flow --      Pain Score 02/23/19 0343 7     Pain Loc --      Pain Edu? --       Excl. in [place]? --     Constitutional: Alert and oriented. Well appearing and in no acute distress. Eyes: Conjunctivae are normal. PERRL. EOMI. Head: Atraumatic. Nose: No congestion/rhinnorhea. Mouth/Throat: Mucous membranes are moist.  Oropharynx non-erythematous. Neck: No stridor.  No meningeal signs.   Cardiovascular: Normal rate, regular rhythm. Good peripheral circulation. Grossly normal heart sounds.   Respiratory: Normal respiratory effort.  No retractions. Lungs CTAB. Gastrointestinal: Soft and nontender. No distention.  Musculoskeletal: No lower extremity tenderness nor edema. No gross deformities of extremities. Neurologic:  Normal speech and language. No gross focal neurologic deficits are appreciated.  Skin:  Skin is warm, dry and intact. No rash noted.   ____________________________________________   LABS (all labs ordered are listed, but only abnormal results are displayed)  Labs Reviewed  COMPREHENSIVE METABOLIC PANEL - Abnormal; Notable for the following components:      Result Value   Potassium 2.7 (*)    Glucose, Bld 178 (*)    Calcium 8.8 (*)    Total Protein 6.4 (*)    Albumin 3.2 (*)    AST 14 (*)    All other components within normal limits  CBC WITH DIFFERENTIAL/PLATELET - Abnormal; Notable for the following components:   Hemoglobin 15.1 (*)    All other components within normal limits  ACETAMINOPHEN LEVEL - Abnormal; Notable for the following components:   Acetaminophen (Tylenol), Serum <10 (*)    All other components within normal limits  URINE CULTURE  ETHANOL  RAPID URINE DRUG SCREEN, HOSP PERFORMED  SALICYLATE LEVEL  AMMONIA  MAGNESIUM  URINALYSIS, COMPLETE (UACMP) WITH MICROSCOPIC      ____________________________________________  EKG   EKG Interpretation  Date/Time:  Friday February 23 2019 04:17:39 EDT Ventricular Rate:  71 PR Interval:    QRS Duration: 101 QT Interval:  458 QTC Calculation: 498 R Axis:   -42 Text Interpretation:   Sinus rhythm Left anterior fascicular block Low voltage, precordial leads Consider anterior infarct No significant change since last tracing Confirmed by Merrily Pew [phone]) on 02/23/2019 4:29:16 AM       ____________________________________________  RADIOLOGY  Dg Chest 2 View  Result Date: 02/23/2019 CLINICAL DATA:  Weakness EXAM: CHEST - 2 VIEW COMPARISON:  07/29/2018 FINDINGS: Normal heart size and mediastinal contours. Chronic airway cuffing in this patient with COPD. No acute infiltrate or edema. No effusion or pneumothorax. No acute osseous findings. IMPRESSION: Stable from prior.  No acute finding. Electronically Signed   By: Monte Fantasia M.D.   On: 02/23/2019 04:53   Ct Head Wo Contrast  Result Date: 02/23/2019 CLINICAL DATA:  Generalized weakness.  Hallucination EXAM: CT HEAD WITHOUT CONTRAST TECHNIQUE: Contiguous axial images were obtained from the base of the skull through  the vertex without intravenous contrast. COMPARISON:  01/02/2019 FINDINGS: Brain: No evidence of acute infarction, hemorrhage, hydrocephalus, extra-axial collection or mass lesion/mass effect. Age advanced cerebral volume loss. Mild periventricular low-density attributed to chronic small vessel ischemia. Vascular: No hyperdense vessel or unexpected calcification. Skull: Normal. Negative for fracture or focal lesion. Sinuses/Orbits: No acute finding. Other: Motion degraded at the vertex IMPRESSION: 1. No acute finding. 2. Age advanced volume loss.  Mild chronic small vessel ischemia. 3. Motion degraded at the vertex. Electronically Signed   By: Monte Fantasia M.D.   On: 02/23/2019 04:49    ____________________________________________   PROCEDURES  Procedure(s) performed:   Procedures   ____________________________________________   INITIAL IMPRESSION / ASSESSMENT AND PLAN / ED COURSE  Hypokalemia but not likely related to new psychosis. No obvious infection to cause it. Will replete K but consult  TTS for recommendations. If they suggest discharge, that would be ok.    Pertinent labs & imaging results that were available during my care of the patient were reviewed by me and considered in my medical decision making (see chart for details).  ____________________________________________  FINAL CLINICAL IMPRESSION(S) / ED DIAGNOSES  Final diagnoses:  Weakness  Hypokalemia     MEDICATIONS GIVEN DURING THIS VISIT:  Medications  potassium chloride 10 mEq in 100 mL IVPB (10 mEq Intravenous New Bag/Given 02/23/19 0347)  potassium chloride SA (K-DUR,KLOR-CON) CR tablet 40 mEq (40 mEq Oral Given 02/23/19 0603)  magnesium sulfate IVPB 2 g 50 mL (0 g Intravenous Stopped 02/23/19 0741)     NEW OUTPATIENT MEDICATIONS STARTED DURING THIS VISIT:  Current Discharge Medication List      Note:  This note was prepared with assistance of Dragon voice recognition software. Occasional wrong-word or sound-a-like substitutions may have occurred due to the inherent limitations of voice recognition software.   Chandell Attridge, Corene Cornea, MD 02/23/19 [phone]  Will seek geri-psych placement. Needs K rechecked.     Maxamillion Banas, Corene Cornea, MD 02/23/19 [phone]

## 2019-02-23 NOTE — ED Triage Notes (Signed)
Pt c/o generalized weakness, n/v, pain in bilateral legs and knees,

## 2019-02-23 NOTE — ED Notes (Signed)
Pt in in room with call light within reach. K+ had to be run at 28ml/hr and 166ml/hr NS due to burning. Talking to TTS at this time.

## 2019-02-23 NOTE — Progress Notes (Signed)
Inpatient Diabetes Program Recommendations  AACE/ADA: New Consensus Statement on Inpatient Glycemic Control (2015)  Target Ranges:  Prepandial:   less than 140 mg/dL      Peak postprandial:   less than 180 mg/dL (1-2 hours)      Critically ill patients:  140 - 180 mg/dL   Results for MIRELLE, BISKUP (MRN [id number]) as of 02/23/2019 16:25  Ref. Range 02/23/2019 12:49 02/23/2019 15:01  Glucose-Capillary Latest Ref Range: 70 - 99 mg/dL 448 (H) [facility])  Results for TYRESE, FICEK (MRN [id number]) as of 02/23/2019 16:25  Ref. Range 02/23/2019 05:07  Glucose Latest Ref Range: 70 - 99 mg/dL 178 (H)   Review of Glycemic Control  Diabetes history: DM2 Outpatient Diabetes medications: NPH 80 units QHS, Regular 60 units TID with meals Current orders for Inpatient glycemic control: Lantus 40 units Q24H, Novolog 0-20 units TID with meals, Novolog 0-5 units QHS, Novolog 15 units TID with meals for meal coverage  Inpatient Diabetes Program Recommendations:  Insulin - Basal:  If glucose continues to be greater than 300 mg/dl by this evening at bedtime, please consider ordering additional Lantus dose of 20 units QHS.  Insulin - Meal Coverage: If post prandial glucose continues to be greater than 180 mg/dl with increase to Novolog 15 units TID, please consider increasing meal coverage further to Novolog 25 units TID with meals if patient eats at least 50% of meals.  Note: Received page from [place], [place] regarding glucose over 500 mg/dl.  Asked that RN talk with MD to see if Lantus 40 units could be modified to Q24H (start now), meal coverage be increased to Novolog 15 units TID with meals, and Novolog correction scale be increased to Novolog 0-20 units TID.    Made further recommendations above in the event that glucose continues to be consistently elevated despite changes discussed with RN. Diabetes Coordinator will follow glucose trends and reassess tomorrow.  Thanks, Barnie Alderman, RN, MSN,  CDE Diabetes Coordinator Inpatient Diabetes Program [phone] (Team Pager from 8am to 5pm)

## 2019-02-23 NOTE — Progress Notes (Signed)
Received page from [place], [place] regarding glucose of 448 mg/dl. RN states that MD asked her to call Diabetes Coordinator to ask for advice. Patient would receive Novolog 15 units for correction on current insulin correction scale and Novolog 5 units for her lunch. Therefore, advised RN to go ahead and give the Novolog 20 units as ordered. Asked that she ask MD if Regular diet could be changed to Carb Modified diet. If glucose continues to be consistently elevated, may want to ask MD if frequency of Lantus can be changed to Q24H and give then.  Will continue to follow while holding in the Emergency Room and make further recommendations as more data is collected.  Thanks, Barnie Alderman, RN, MSN, CDE Diabetes Coordinator Inpatient Diabetes Program [phone] (Team Pager from 8am to 5pm)

## 2019-02-23 NOTE — Progress Notes (Addendum)
CSW spoke with Ricarda Frame at [facility], re pt's CBG >400 and asked pt's nurse, Apolonio Schneiders to run a new CBG.  Results=556.    Spoke again with Caryl Pina at [facility] and she notes that once patient's CBG is under 300 for at least 24 hours, they would be able to take patient.    CSW will hand off to weekend CSW.  Areatha Keas. Judi Cong, MSW, [place] Disposition Clinical Social Work [phone] (cell) [phone] (office)

## 2019-02-24 LAB — BASIC METABOLIC PANEL
Anion gap: 15 (ref 5–15)
BUN: 17 mg/dL (ref 6–20)
CO2: 30 mmol/L (ref 22–32)
Calcium: 8.8 mg/dL — ABNORMAL LOW (ref 8.9–10.3)
Chloride: 88 mmol/L — ABNORMAL LOW (ref 98–111)
Creatinine, Ser: 1.12 mg/dL — ABNORMAL HIGH (ref 0.44–1.00)
GFR calc Af Amer: >60 mL/min (ref >60)
GFR calc non Af Amer: 54 mL/min — ABNORMAL LOW (ref >60)
Glucose, Bld: 453 mg/dL — ABNORMAL HIGH (ref 70–99)
Potassium: 3.1 mmol/L — ABNORMAL LOW (ref 3.5–5.1)
Sodium: 133 mmol/L — ABNORMAL LOW (ref 135–145)

## 2019-02-24 LAB — CBG MONITORING, ED
Glucose-Capillary: 400 mg/dL — ABNORMAL HIGH (ref 70–99)
Glucose-Capillary: 438 mg/dL — ABNORMAL HIGH (ref 70–99)
Glucose-Capillary: 358 mg/dL — ABNORMAL HIGH (ref 70–99)
Glucose-Capillary: 111 mg/dL — ABNORMAL HIGH (ref 70–99)
Glucose-Capillary: 117 mg/dL — ABNORMAL HIGH (ref 70–99)

## 2019-02-24 LAB — URINE CULTURE: Culture: NO GROWTH

## 2019-02-24 MED ORDER — INSULIN ASPART 100 UNIT/ML ~~LOC~~ SOLN
30.0000 [IU] | Freq: Three times a day (TID) | SUBCUTANEOUS | Status: DC
  Administered 2019-02-24: 30 [IU] via SUBCUTANEOUS
  Filled 2019-02-24: qty 1

## 2019-02-24 MED ORDER — ACETAMINOPHEN 325 MG PO TABS
650.0000 mg | ORAL_TABLET | Freq: Once | ORAL | Status: AC
  Administered 2019-02-24: 650 mg via ORAL
  Filled 2019-02-24: qty 2

## 2019-02-24 MED ORDER — LORAZEPAM 1 MG PO TABS: 2.0000 mg | ORAL_TABLET | Freq: Once | ORAL | Status: DC

## 2019-02-24 MED ORDER — INSULIN GLARGINE 100 UNIT/ML ~~LOC~~ SOLN
25.0000 [IU] | SUBCUTANEOUS | Status: AC
  Administered 2019-02-24: 25 [IU] via SUBCUTANEOUS
  Filled 2019-02-24: qty 0.25

## 2019-02-24 MED ORDER — POTASSIUM CHLORIDE CRYS ER 20 MEQ PO TBCR
40.0000 meq | EXTENDED_RELEASE_TABLET | Freq: Once | ORAL | Status: AC
  Administered 2019-02-24: 40 meq via ORAL
  Filled 2019-02-24: qty 2

## 2019-02-24 MED ORDER — INSULIN GLARGINE 100 UNIT/ML ~~LOC~~ SOLN
65.0000 [IU] | SUBCUTANEOUS | Status: DC
  Administered 2019-02-24 – 2019-02-25 (×2): 65 [IU] via SUBCUTANEOUS
  Filled 2019-02-24 (×3): qty 0.65

## 2019-02-24 MED ORDER — INSULIN ASPART 100 UNIT/ML ~~LOC~~ SOLN
10.0000 [IU] | Freq: Three times a day (TID) | SUBCUTANEOUS | Status: DC
  Administered 2019-02-25 – 2019-02-26 (×5): 10 [IU] via SUBCUTANEOUS
  Filled 2019-02-24 (×6): qty 1

## 2019-02-24 MED ORDER — LORAZEPAM 2 MG/ML IJ SOLN
1.0000 mg | Freq: Once | INTRAMUSCULAR | Status: AC
  Administered 2019-02-24: 1 mg via INTRAMUSCULAR
  Filled 2019-02-24: qty 1

## 2019-02-24 NOTE — BHH Counselor (Signed)
  Reassessment--Pt was alert, pleasant, and observed sitting in a chair. Pt was disoriented during the assessment. (Pt knew her name and date of birth but not the date or the day of week.) Pt denies SI states, "I feel like I want to drink a gallon of water."  Pt denies current auditory hallucinations.  Pt states "I'm not hearing anything today."  Pt denies current visual hallucinations.  Pt states "I didn't see anything yesterday but I did see something the day before." Pt denies HI.  Pt continues to meet inpatient criteria.  Eulan Heyward L. [place], [place], [facility], [facility] Therapeutic Triage Specialist  [phone]

## 2019-02-24 NOTE — ED Notes (Signed)
Telepsy reassessment being done. Pt was confused to place and time. Talking about random things this morning.

## 2019-02-24 NOTE — ED Notes (Signed)
Saw patient sticking her finger down her throat trying to make herself throw up.

## 2019-02-24 NOTE — ED Notes (Signed)
Patient complaining of a headache at this time.

## 2019-02-24 NOTE — ED Notes (Signed)
Patient ate some supper. Patient ambulatory to the rest room. Patient was talking on the phone to her son. Speech is clear. Patient able to communicate her needs.

## 2019-02-24 NOTE — Progress Notes (Addendum)
Inpatient Diabetes Program Recommendations  AACE/ADA: New Consensus Statement on Inpatient Glycemic Control (2015)  Target Ranges:  Prepandial:   less than 140 mg/dL      Peak postprandial:   less than 180 mg/dL (1-2 hours)      Critically ill patients:  140 - 180 mg/dL   Results for [name], [name] (MRN [id number]) as of 02/24/2019 09:14  Ref. Range 02/23/2019 12:49 02/23/2019 15:01 02/23/2019 18:22 02/23/2019 21:25  Glucose-Capillary Latest Ref Range: 70 - 99 mg/dL 448 (H)  20 units NOVOLOG given at 1:56pm 556 (HH) 574 (HH)  35 units NOVOLOG +  40 units LANTUS given at 5:02pm  392 (H)  5 units NOVOLOG    Results for [name], [name] (MRN [id number]) as of 02/24/2019 09:14  Ref. Range 02/24/2019 09:01  Glucose-Capillary Latest Ref Range: 70 - 99 mg/dL 400 (H)  15 units NOVOLOG     Home DM Meds: NPH 80 units QHS       Regular 60 units TID with meals   Current Orders: Lantus 40 units Daily      Novolog Resistant Correction Scale/ SSI (0-20 units) TID AC + HS      Novolog 15 units TID with meals     Note in review of chart, patient will not be allowed to be transferred to Outside Pysch unit until CBGs stay under 300 mg/dl for at least 24 hours.  CBG this AM: 400 mg/dl     MD- Please consider the following:  1. Give an additional 25 units Lantus X 1 dose now this AM  2. Increase scheduled Lantus dose to 65 units QPM (scheduled for 5pm tonight)--This would be 80% total home dose basal insulin  3. Increase Novolog Meal Coverage to: Novolog 30 units TID with meals (50% total home dose of meal time coverage)     --Will follow patient during hospitalization--  Wyn Quaker RN, MSN, CDE Diabetes Coordinator Inpatient Glycemic Control Team Team Pager: [phone] (8a-5p)

## 2019-02-24 NOTE — Progress Notes (Addendum)
CSW spoke with Ricarda Frame from [facility] regarding disposition. She inquired regarding patient's CBG level. CSW informed her that they had not be updated for this morning. She informed CSW that they would need to be under 300 for 24 hours before they would accept patient. She stated that if they could be collected this morning and evening that would be helpful. CSW stated that the nurse would be informed.   CSW contacted R. Cockram, RN, nurse for patient regarding request from [place].   Chalmers Guest. Guerry Bruin, MSW, [place] Work/Disposition Phone: [phone] Fax: [phone]

## 2019-02-24 NOTE — ED Notes (Signed)
Pt ate before BS checked,  Calling Diabetic counselor, EDP  Looking at orders,  Will recheck pt after bath and use sliding scale.

## 2019-02-24 NOTE — ED Notes (Signed)
Pt in the shower room taking a shower at this time.

## 2019-02-25 ENCOUNTER — Encounter (HOSPITAL_COMMUNITY): Payer: Self-pay | Admitting: Emergency Medicine

## 2019-02-25 LAB — CBG MONITORING, ED
Glucose-Capillary: 225 mg/dL — ABNORMAL HIGH (ref 70–99)
Glucose-Capillary: 302 mg/dL — ABNORMAL HIGH (ref 70–99)
Glucose-Capillary: 272 mg/dL — ABNORMAL HIGH (ref 70–99)
Glucose-Capillary: 184 mg/dL — ABNORMAL HIGH (ref 70–99)
Glucose-Capillary: 239 mg/dL — ABNORMAL HIGH (ref 70–99)

## 2019-02-25 NOTE — ED Notes (Signed)
Awaiting placement

## 2019-02-25 NOTE — ED Notes (Signed)
Pt given meal tray. Pt resting at this time.

## 2019-02-25 NOTE — ED Notes (Signed)
Pt in [place] for prolonged time   ? Due to her girth, it is difficult for her to wipe   Her clothing is exchanged

## 2019-02-25 NOTE — ED Notes (Signed)
Pt given dinner tray.

## 2019-02-25 NOTE — ED Notes (Signed)
Pt is sleeping at this time in a recliner

## 2019-02-25 NOTE — ED Notes (Signed)
Son called - update on attempts to place by [facility]

## 2019-02-25 NOTE — ED Notes (Signed)
CBG 187 

## 2019-02-25 NOTE — ED Notes (Signed)
Patient is resting comfortably. 

## 2019-02-25 NOTE — ED Notes (Signed)
Attempted to feed pt. Only drank a sip of milk. Pt continually attempting to take off shirt. Conts to be redirected . Pt taken to shower at this time then will attempt to feed

## 2019-02-25 NOTE — BHH Counselor (Addendum)
Re-assessment:   At the beginning to the re-assessment patient's nurse(Robin) reported patient has been given Ativan last night at 2159 and was real sleepy. Nurse report patient does not speak just yells.   TTS attempted to assessment patient. Patient was non-responsive to her name being called nor responsive to the nurse. Patient just yawned but never responded. Patient appeared to be sedated due medication.

## 2019-02-25 NOTE — ED Notes (Signed)
Call to [facility] to inquire status of pt transfer

## 2019-02-25 NOTE — ED Notes (Signed)
Pt more alert after shower. Pt ate bowl of cereal and banana

## 2019-02-25 NOTE — ED Notes (Signed)
Out of bed to BR 

## 2019-02-25 NOTE — ED Notes (Signed)
Pt last CBG 302   Insulin was held last night at 2207  Will recheck at noon

## 2019-02-25 NOTE — ED Notes (Signed)
Pt eating dinner  Wonders aloud if she will be allowed home tomorrow

## 2019-02-25 NOTE — ED Notes (Signed)
Pt given a shower by this NT.

## 2019-02-25 NOTE — ED Notes (Signed)
Pt attempted to get out of recliner  Due to morbid obesity, pt is unable to scoot herself back in chair  Also unable to assist when getting out of chair  She is assisted to a hard back chair

## 2019-02-25 NOTE — ED Notes (Signed)
sleeping

## 2019-02-25 NOTE — Progress Notes (Signed)
CSW spoke with Melissa about sending over new labs. After sending over new lab results, Melissa stated that patient's CBG would need to be under 300 for at least 24 hours to be considered for admission.   CSW contacted referral facilities with the following results:  Still reviewing: [facility] (resent per request) [facility] (see note above) [facility]  TTS will continue to seek placement.  Chalmers Guest. Guerry Bruin, MSW, [place] Work/Disposition Phone: [phone] Fax: [phone]

## 2019-02-25 NOTE — ED Notes (Addendum)
[place] attempted to reassess. Pt sleeping sitting up in chair. Lethargic and unable to participate. [place] will call back

## 2019-02-25 NOTE — BHH Counselor (Signed)
[facility] re: possible transfer.  I was advised that they need blood sugar of under 300 for at least 24 hours.  The last blood sugar report they received this morning was at 302.

## 2019-02-26 LAB — CBG MONITORING, ED
Glucose-Capillary: 129 mg/dL — ABNORMAL HIGH (ref 70–99)
Glucose-Capillary: 195 mg/dL — ABNORMAL HIGH (ref 70–99)

## 2019-02-26 NOTE — ED Notes (Signed)
Pt is alert and pleasant this morning.  Sitting in chair in NAD.  States she is not hearing anything but she is still seeing things she does not think are really there.  Denies suicidal or homicidal ideations.  Thinks she is at the health department.  Reoriented.

## 2019-02-26 NOTE — Progress Notes (Signed)
Pt accepted to [facility]  Lieutenant Diego, MD is the accepting/attending provider.  Call report to [phone] [facility] notified.   Pt is Voluntary  Pt may be transported by [facility]  Pt scheduled  to arrive at [facility] as soon as transport can be arranged.  Areatha Keas. Judi Cong, MSW, [place] Disposition Clinical Social Work [phone] (cell) [phone] (office)

## 2019-02-26 NOTE — Progress Notes (Signed)
CSW contacted by Lenna Sciara, A/C at [facility], who requested that the last 24 hours of labs be sent to them confirming that patient's CBG has been under 200 for the last 24 hours.  [facility] A/C will send Voluntary consent forms to [facility] ED for patient's signature. [facility] contacted and will attempt to have patient sign [facility]'s Voluntary consent and, if successful, will fax to [facility] directly at [phone].  If patient unable/unwilling to sign may need to IVC for transfer to [facility].  Areatha Keas. Judi Cong, MSW, [place] Disposition Clinical Social Work [phone] (cell) [phone] (office)

## 2019-02-27 ENCOUNTER — Ambulatory Visit (HOSPITAL_COMMUNITY): Payer: Self-pay | Admitting: Psychiatry

## 2019-08-16 ENCOUNTER — Emergency Department (HOSPITAL_COMMUNITY)
Admission: EM | Admit: 2019-08-16 | Discharge: 2019-08-16 | Disposition: A | Discharge status: Home / Self Care | Payer: BC Managed Care – PPO | Attending: Emergency Medicine | Admitting: Emergency Medicine

## 2019-08-16 ENCOUNTER — Emergency Department (HOSPITAL_COMMUNITY): Payer: BC Managed Care – PPO

## 2019-08-16 ENCOUNTER — Encounter (HOSPITAL_COMMUNITY): Payer: Self-pay

## 2019-08-16 ENCOUNTER — Other Ambulatory Visit: Payer: Self-pay

## 2019-08-16 DIAGNOSIS — Z7722 Contact with and (suspected) exposure to environmental tobacco smoke (acute) (chronic): Secondary | ICD-10-CM | POA: Diagnosis not present

## 2019-08-16 DIAGNOSIS — Z79899 Other long term (current) drug therapy: Secondary | ICD-10-CM | POA: Insufficient documentation

## 2019-08-16 DIAGNOSIS — R110 Nausea: Secondary | ICD-10-CM | POA: Diagnosis not present

## 2019-08-16 DIAGNOSIS — R0789 Other chest pain: Secondary | ICD-10-CM

## 2019-08-16 DIAGNOSIS — R531 Weakness: Secondary | ICD-10-CM | POA: Insufficient documentation

## 2019-08-16 DIAGNOSIS — R12 Heartburn: Secondary | ICD-10-CM | POA: Insufficient documentation

## 2019-08-16 DIAGNOSIS — R0602 Shortness of breath: Secondary | ICD-10-CM | POA: Insufficient documentation

## 2019-08-16 DIAGNOSIS — E119 Type 2 diabetes mellitus without complications: Secondary | ICD-10-CM | POA: Insufficient documentation

## 2019-08-16 DIAGNOSIS — J449 Chronic obstructive pulmonary disease, unspecified: Secondary | ICD-10-CM | POA: Insufficient documentation

## 2019-08-16 DIAGNOSIS — R1010 Upper abdominal pain, unspecified: Secondary | ICD-10-CM | POA: Diagnosis not present

## 2019-08-16 DIAGNOSIS — Z760 Encounter for issue of repeat prescription: Secondary | ICD-10-CM

## 2019-08-16 DIAGNOSIS — I10 Essential (primary) hypertension: Secondary | ICD-10-CM | POA: Insufficient documentation

## 2019-08-16 LAB — URINALYSIS, ROUTINE W REFLEX MICROSCOPIC
Bacteria, UA: NONE SEEN
Bilirubin Urine: NEGATIVE
Glucose, UA: NEGATIVE mg/dL
Hgb urine dipstick: NEGATIVE
Ketones, ur: NEGATIVE mg/dL
Leukocytes,Ua: NEGATIVE
Nitrite: NEGATIVE
Protein, ur: 100 mg/dL — AB
Specific Gravity, Urine: 1.011 (ref 1.005–1.030)
pH: 6 (ref 5.0–8.0)

## 2019-08-16 LAB — CBC WITH DIFFERENTIAL/PLATELET
Abs Immature Granulocytes: 0.02 10*3/uL (ref 0.00–0.07)
Basophils Absolute: 0.1 10*3/uL (ref 0.0–0.1)
Basophils Relative: 1 %
Eosinophils Absolute: 0.6 10*3/uL — ABNORMAL HIGH (ref 0.0–0.5)
Eosinophils Relative: 6 %
HCT: 48.5 % — ABNORMAL HIGH (ref 36.0–46.0)
Hemoglobin: 16.2 g/dL — ABNORMAL HIGH (ref 12.0–15.0)
Immature Granulocytes: 0 %
Lymphocytes Relative: 24 %
Lymphs Abs: 2.3 10*3/uL (ref 0.7–4.0)
MCH: 29.4 pg (ref 26.0–34.0)
MCHC: 33.4 g/dL (ref 30.0–36.0)
MCV: 88 fL (ref 80.0–100.0)
Monocytes Absolute: 0.5 10*3/uL (ref 0.1–1.0)
Monocytes Relative: 5 %
Neutro Abs: 6.2 10*3/uL (ref 1.7–7.7)
Neutrophils Relative %: 64 %
Platelets: 283 10*3/uL (ref 150–400)
RBC: 5.51 MIL/uL — ABNORMAL HIGH (ref 3.87–5.11)
RDW: 13.1 % (ref 11.5–15.5)
WBC: 9.6 10*3/uL (ref 4.0–10.5)
nRBC: 0 % (ref 0.0–0.2)

## 2019-08-16 LAB — TROPONIN I (HIGH SENSITIVITY)
Troponin I (High Sensitivity): 5 ng/L (ref <18)
Troponin I (High Sensitivity): 4 ng/L (ref <18)

## 2019-08-16 LAB — COMPREHENSIVE METABOLIC PANEL
ALT: 16 U/L (ref 0–44)
AST: 19 U/L (ref 15–41)
Albumin: 3.4 g/dL — ABNORMAL LOW (ref 3.5–5.0)
Alkaline Phosphatase: 100 U/L (ref 38–126)
Anion gap: 12 (ref 5–15)
BUN: 13 mg/dL (ref 6–20)
CO2: 26 mmol/L (ref 22–32)
Calcium: 9 mg/dL (ref 8.9–10.3)
Chloride: 100 mmol/L (ref 98–111)
Creatinine, Ser: 0.93 mg/dL (ref 0.44–1.00)
GFR calc Af Amer: >60 mL/min (ref >60)
GFR calc non Af Amer: >60 mL/min (ref >60)
Glucose, Bld: 261 mg/dL — ABNORMAL HIGH (ref 70–99)
Potassium: 4.1 mmol/L (ref 3.5–5.1)
Sodium: 138 mmol/L (ref 135–145)
Total Bilirubin: 0.9 mg/dL (ref 0.3–1.2)
Total Protein: 6.9 g/dL (ref 6.5–8.1)

## 2019-08-16 LAB — LIPASE, BLOOD: Lipase: 21 U/L (ref 11–51)

## 2019-08-16 IMAGING — CR DG CHEST 2V
2 series · 2 of 2 positions shown · non-contrast
Comparison: [DATE]

CLINICAL DATA: Shortness of breath

EXAM:
CHEST - 2 VIEW

[w chest pa]
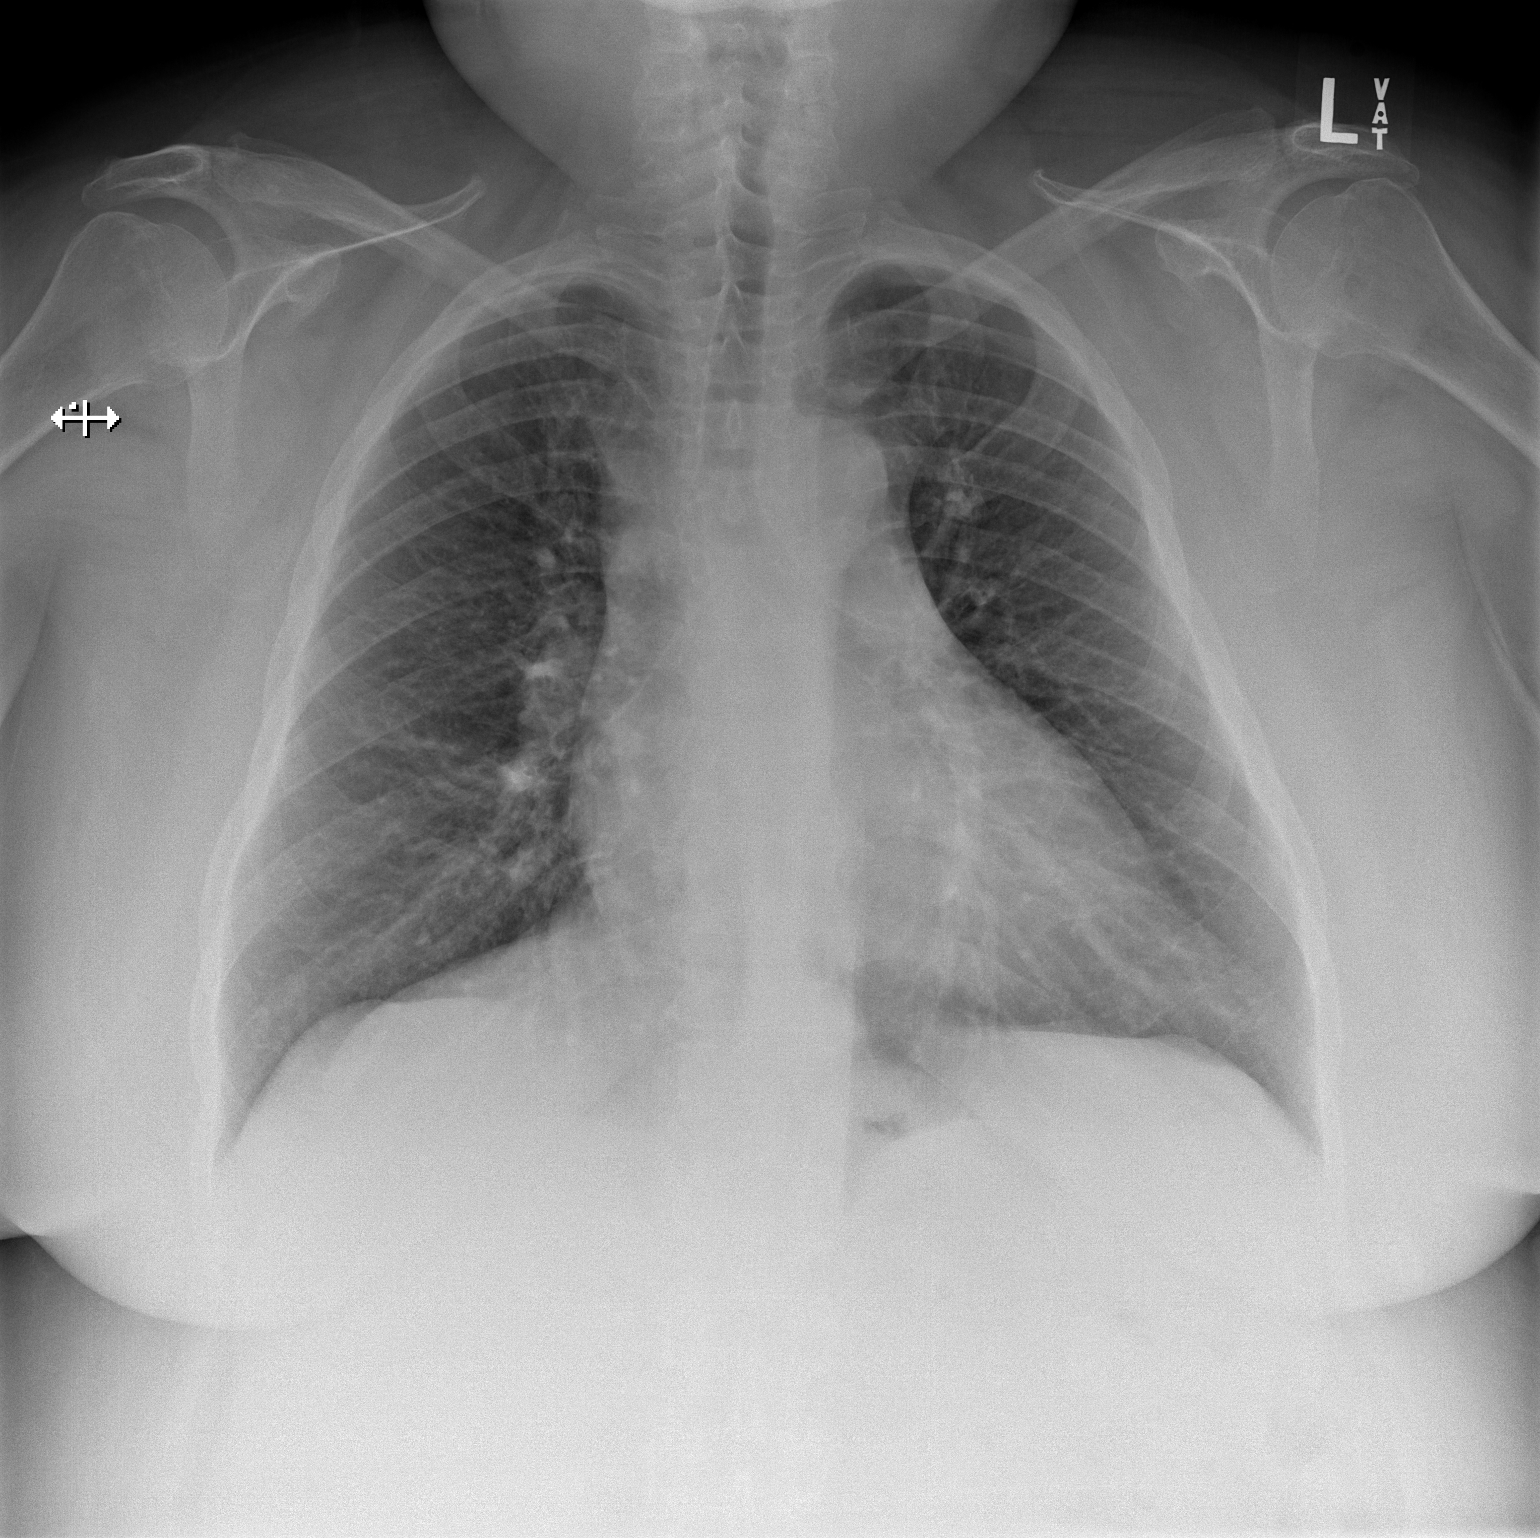

[w chest lat]
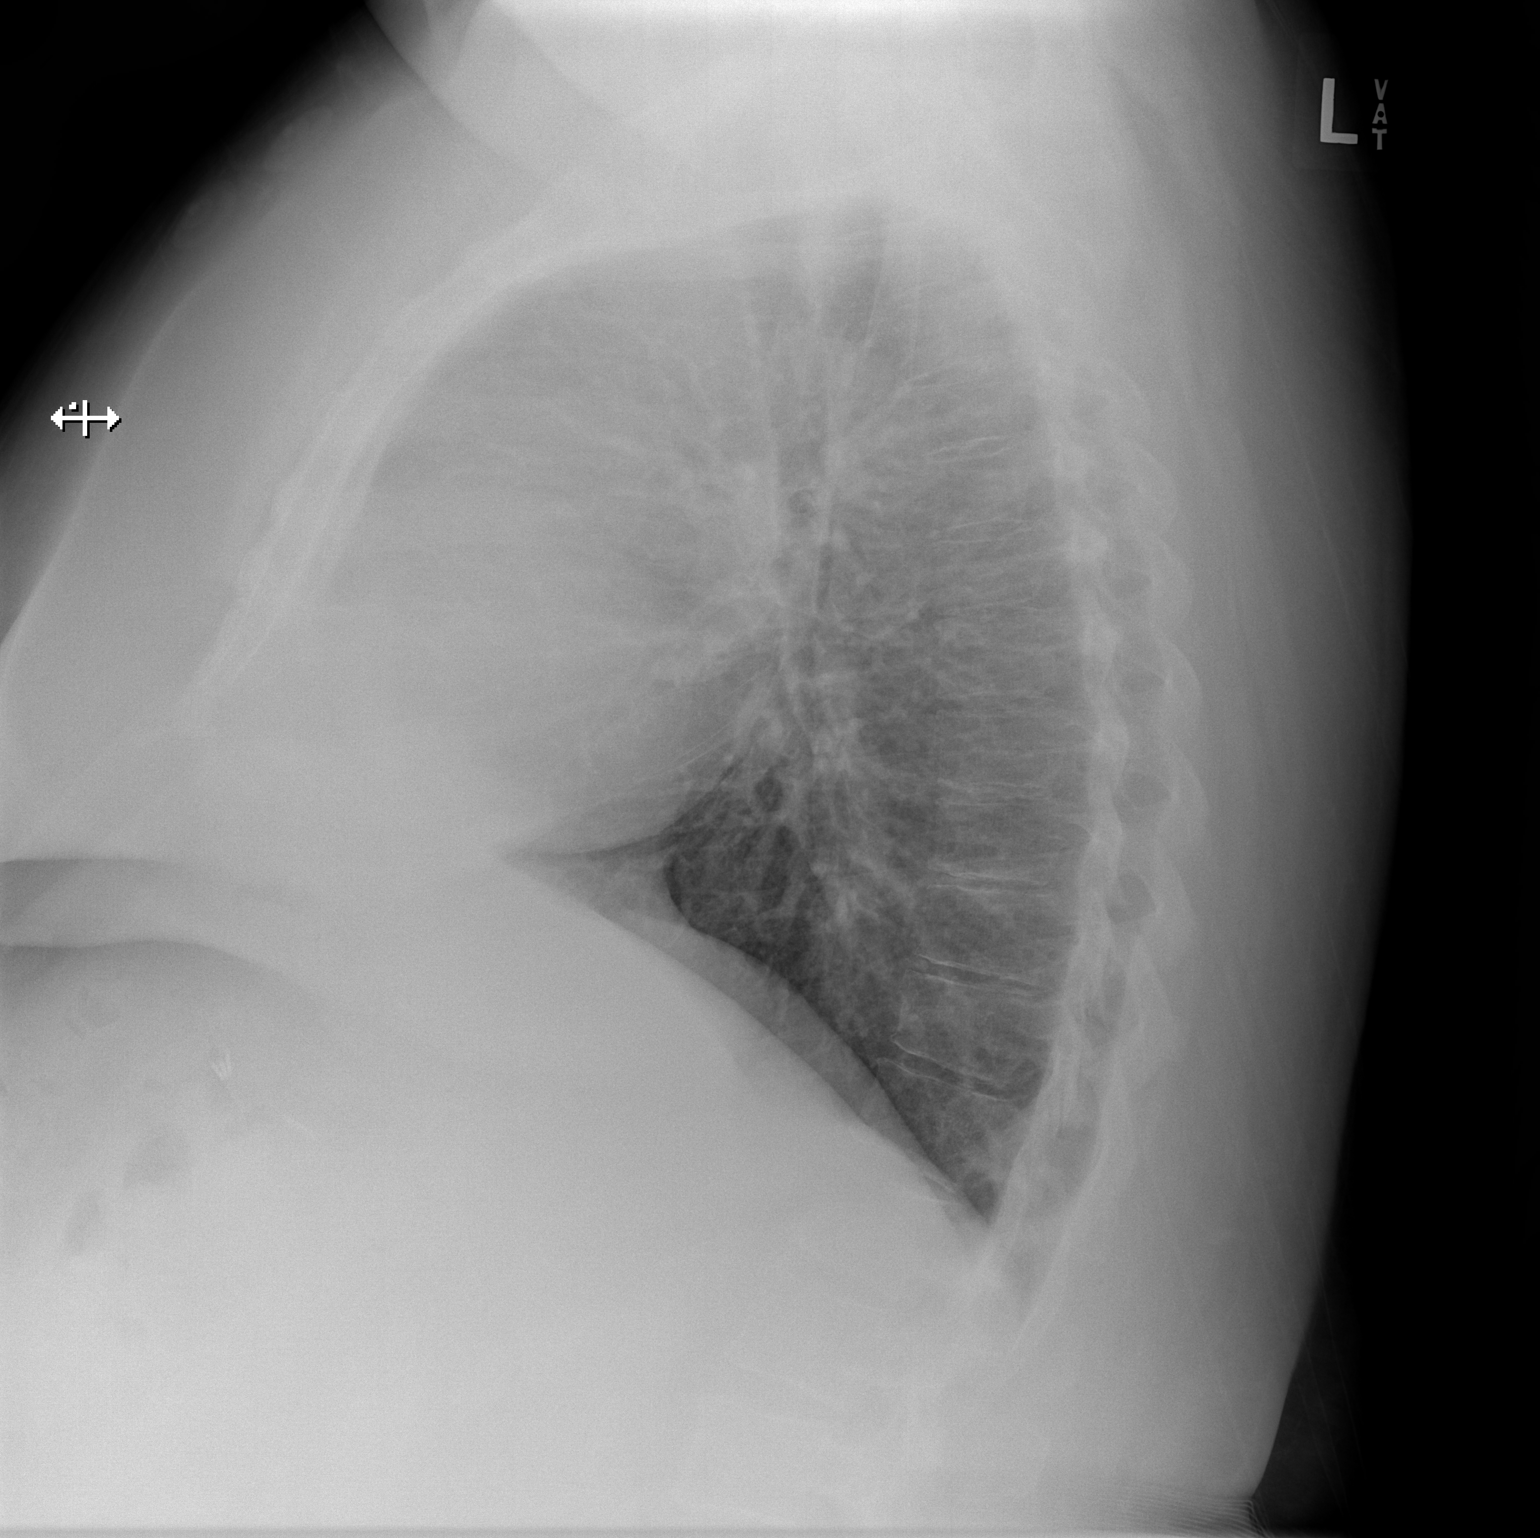

[2 of 2 positions shown; findings below may reference images not displayed]

FINDINGS: The heart size and mediastinal contours are within normal limits.
Both lungs are clear. The visualized skeletal structures are
unremarkable.
IMPRESSION: No acute abnormality of the lungs.

## 2019-08-16 MED ORDER — LIDOCAINE VISCOUS HCL 2 % MT SOLN
15.0000 mL | Freq: Once | OROMUCOSAL | Status: DC
  Filled 2019-08-16: qty 15

## 2019-08-16 MED ORDER — SODIUM CHLORIDE 0.9% FLUSH: 3.0000 mL | Freq: Once | INTRAVENOUS | Status: DC

## 2019-08-16 MED ORDER — ESCITALOPRAM OXALATE 20 MG PO TABS: 20.0000 mg | ORAL_TABLET | Freq: Every day | ORAL | 0 refills | Status: DC

## 2019-08-16 MED ORDER — ALUM & MAG HYDROXIDE-SIMETH 200-200-20 MG/5ML PO SUSP
30.0000 mL | Freq: Once | ORAL | Status: AC
  Administered 2019-08-16: 30 mL via ORAL
  Filled 2019-08-16: qty 30

## 2019-08-16 NOTE — ED Triage Notes (Addendum)
Patient c/o SOB, heartburn, and weakness since yesterday. Patient also repots she has been hypertensive at home. BP 149/59 in triage.  Patient also reports nausea and abdominal pain since yesterday.

## 2019-08-16 NOTE — Discharge Instructions (Signed)
Continue to take all your home medications as prescribed.  Call your PCP or cardiologist tomorrow to set up a follow-up appointment for soon as possible for reevaluation of your symptoms.  He denied of bland foods and avoids fried foods, spicy foods, fatty foods, or acidic foods that may upset your stomach and cause some of your symptoms.  Return to the emergency department immediately if any concerning signs or symptoms develop such as fevers, worsening shortness of breath or chest pain, persistent vomiting, or loss of consciousness.

## 2019-08-16 NOTE — ED Notes (Signed)
2 attempts for IV placement, both unsuccessful. IV consult placed.

## 2019-08-16 NOTE — ED Notes (Signed)
Pt was able to ambulate to bathroom unassisted. Lowest pulse oximeter read was 96 percent. Pt complained of mild left leg cramping in left thigh, which resolved with sitting.

## 2019-08-16 NOTE — ED Provider Notes (Signed)
[facility] DEPT Provider Note   CSN: [id number] Arrival date & time: 08/16/19  1448     History   Chief Complaint Chief Complaint  Patient presents with  . Shortness of Breath  . Weakness  . Hypertension  . Heartburn  . Abdominal Pain    HPI [name] is a 58 y.o. female with history of COPD, diabetes mellitus, CHF, hypertension, prolonged QT interval presents today for evaluation of cute onset, intermittent chest pain shortness of breath and generalized weakness since yesterday.  She reports that with ambulation she feels short of breath and experienced intermittent midline chest pains which she describes as similar to indigestion.  The pain radiated yesterday but she had a difficult time describing where or how.  She also noted associated nausea and she reports that she self-induced emesis due to her persistent nausea.  She had some intermittent cramping upper abdominal pain.  Today she reports feeling mostly as though she has some heartburn.  No aggravating or alleviating factors noted.  She denies lightheadedness, diaphoresis, or syncope.  No fevers, chills, or cough.  She is a non-smoker, denies recreational drug use or excessive alcohol intake.  She took her Nexium at home without relief of symptoms.      The history is provided by the patient.    Past Medical History:  Diagnosis Date  . Asthma   . COPD (chronic obstructive pulmonary disease) ([place])   . Depression   . Diabetes mellitus   . Diastolic dysfunction 12/14/7586  . Hypertension   . Prolonged QT interval 05/14/2015    Patient Active Problem List   Diagnosis Date Noted  . Diastolic dysfunction 32/54/9826  . Prolonged QT interval 05/14/2015  . Palpitations 07/23/2014  . Generalized weakness 07/23/2014  . Hypokalemia 07/22/2014  . Diabetes mellitus ([place]) 07/22/2014  . Hypertension 07/22/2014  . COPD (chronic obstructive pulmonary disease) ([place]) 07/22/2014  . Depression  07/22/2014    Past Surgical History:  Procedure Laterality Date  . CATARACT EXTRACTION    . CESAREAN SECTION    . CHOLECYSTECTOMY       OB History    Gravida  3   Para  2   Term  2   Preterm      AB  1   Living  2     SAB  1   TAB      Ectopic      Multiple      Live Births               Home Medications    Prior to Admission medications   Medication Sig Start Date End Date Taking? Authorizing Provider  albuterol (PROAIR HFA) 108 (90 BASE) MCG/ACT inhaler Inhale 2 puffs into the lungs every 6 (six) hours as needed for wheezing or shortness of breath.   Yes [provider]  dicyclomine (BENTYL) 20 MG tablet Take 20 mg by mouth 3 (three) times daily.  02/21/18  Yes [provider]  diphenhydrAMINE (BENADRYL) 25 MG tablet Take 25 mg by mouth every 6 (six) hours as needed for allergies.   Yes [provider]  esomeprazole (NEXIUM) 20 MG capsule Take 20 mg by mouth daily at 12 noon.   Yes [provider]  insulin aspart protamine- aspart (NOVOLOG MIX 70/30) (70-30) 100 UNIT/ML injection Inject 60 Units into the skin 2 (two) times daily with a meal.   Yes [provider]  meclizine (ANTIVERT) 12.5 MG tablet Take 12.5  mg by mouth 2 (two) times daily as needed for dizziness.  12/25/18  Yes [provider]  potassium chloride SA (K-DUR) 20 MEQ tablet Take 20 mEq by mouth 2 (two) times daily.   Yes [provider]  propranolol ER (INDERAL LA) 120 MG 24 hr capsule Take 120 mg by mouth at bedtime.    Yes [provider]  torsemide (DEMADEX) 20 MG tablet Take 20 mg by mouth 2 (two) times daily.  03/28/18  Yes [provider]  Blood Glucose Monitoring Suppl ([place]) w/Device KIT by Does not apply route. 12/07/17   [provider]  escitalopram (LEXAPRO) 20 MG tablet Take 1 tablet (20 mg total) by mouth daily. 08/16/19   Ramesha Poster A, PA-C  torsemide (DEMADEX) 20 MG tablet  Take 1 tablet (20 mg total) by mouth 2 (two) times daily. Patient not taking: Reported on 08/16/2019 05/16/15   Samuella Cota, MD    Family History Family History  Problem Relation Age of Onset  . Stroke Mother   . Diabetes Father   . Heart failure Father   . Hypertension Father   . Diabetes Sister   . Heart failure Sister   . Hypertension Sister   . Stroke Sister   . Cancer Other   . Stroke Sister     Social History Social History   Tobacco Use  . Smoking status: Passive Smoke Exposure - Never Smoker  . Smokeless tobacco: Never Used  Substance Use Topics  . Alcohol use: No    Alcohol/week: 0.0 standard drinks  . Drug use: No     Allergies   Benicar [olmesartan], Codeine, Sulfa antibiotics, and Trulicity [dulaglutide]   Review of Systems Review of Systems  Constitutional: Negative for chills and fever.  Respiratory: Positive for shortness of breath. Negative for cough.   Cardiovascular: Positive for chest pain.  Gastrointestinal: Positive for abdominal pain, nausea and vomiting. Negative for constipation and diarrhea.  Genitourinary: Negative for dysuria, hematuria and urgency.  Neurological: Negative for syncope, light-headedness and headaches.  All other systems reviewed and are negative.    Physical Exam Updated Vital Signs BP 126/73   Pulse 60   Temp 98.2 F (36.8 C) (Oral)   Resp 12   Ht '5\' 1"'  (1.549 m)   Wt 136.1 kg   LMP 08/06/2012   SpO2 99%   BMI 56.68 kg/m   Physical Exam Vitals signs and nursing note reviewed.  Constitutional:      General: She is not in acute distress.    Appearance: She is obese.  HENT:     Head: Normocephalic and atraumatic.  Eyes:     General:        Right eye: No discharge.        Left eye: No discharge.     Conjunctiva/sclera: Conjunctivae normal.  Neck:     Musculoskeletal: Neck supple.     Vascular: No JVD.     Trachea: No tracheal deviation.  Cardiovascular:     Rate and Rhythm: Normal rate and  regular rhythm.     Pulses: Normal pulses.  Pulmonary:     Effort: Pulmonary effort is normal.     Comments: Examination limited due to body habitus.  No wheezing or other adventitious breath sounds noted.  Speaking in full sentences without difficulty with no increased work of breathing. Chest:     Chest wall: No tenderness.  Abdominal:     General: Bowel sounds are normal. There  is no distension.     Palpations: Abdomen is soft.     Tenderness: There is no abdominal tenderness. There is no guarding or rebound.  Musculoskeletal:     Right lower leg: She exhibits no tenderness. Edema present.     Left lower leg: She exhibits no tenderness. Edema present.     Comments: Bilateral lower extremity edema, right worse than left.  Patient reports this is chronic and unchanged.  Skin:    General: Skin is warm and dry.     Findings: No erythema.  Neurological:     Mental Status: She is alert.  Psychiatric:        Behavior: Behavior normal.      ED Treatments / Results  Labs (all labs ordered are listed, but only abnormal results are displayed) Labs Reviewed  URINALYSIS, ROUTINE W REFLEX MICROSCOPIC - Abnormal; Notable for the following components:      Result Value   Protein, ur 100 (*)    All other components within normal limits  COMPREHENSIVE METABOLIC PANEL - Abnormal; Notable for the following components:   Glucose, Bld 261 (*)    Albumin 3.4 (*)    All other components within normal limits  CBC WITH DIFFERENTIAL/PLATELET - Abnormal; Notable for the following components:   RBC 5.51 (*)    Hemoglobin 16.2 (*)    HCT 48.5 (*)    Eosinophils Absolute 0.6 (*)    All other components within normal limits  LIPASE, BLOOD  TROPONIN I (HIGH SENSITIVITY)  TROPONIN I (HIGH SENSITIVITY)    EKG EKG Interpretation  Date/Time:  Thursday August 16 2019 15:05:36 EDT Ventricular Rate:  67 PR Interval:    QRS Duration: 96 QT Interval:  455 QTC Calculation: 481 R Axis:   -40  Text Interpretation:  Sinus rhythm Left anterior fascicular block Low voltage, precordial leads Consider anterior infarct Confirmed by Virgel Manifold [phone]) on 08/16/2019 7:17:31 PM   Radiology Dg Chest 2 View  Result Date: 08/16/2019 CLINICAL DATA:  Shortness of breath EXAM: CHEST - 2 VIEW COMPARISON:  02/23/2019 FINDINGS: The heart size and mediastinal contours are within normal limits. Both lungs are clear. The visualized skeletal structures are unremarkable. IMPRESSION: No acute abnormality of the lungs. Electronically Signed   By: Eddie Candle M.D.   On: 08/16/2019 16:05    Procedures Procedures (including critical care time)  Medications Ordered in ED Medications  sodium chloride flush (NS) 0.9 % injection 3 mL (has no administration in time range)  alum & mag hydroxide-simeth (MAALOX/MYLANTA) 200-200-20 MG/5ML suspension 30 mL (30 mLs Oral Given 08/16/19 1852)    And  lidocaine (XYLOCAINE) 2 % viscous mouth solution 15 mL (15 mLs Oral Refused 08/16/19 1853)     Initial Impression / Assessment and Plan / ED Course  I have reviewed the triage vital signs and the nursing notes.  Pertinent labs & imaging results that were available during my care of the patient were reviewed by me and considered in my medical decision making (see chart for details).        Patient presenting for evaluation of intermittent chest pain and shortness of breath since yesterday.  She is afebrile, vital signs are stable.  She is nontoxic in appearance.  She is resting comfortably no apparent distress per she also had some complaint of upper abdominal pain and feelings of indigestion when describing her chest pain.  Her abdominal examination is entirely benign today.  EKG shows no acute ischemic abnormalities and chest x-ray  shows no acute cardiopulmonary abnormalities with no evidence of pneumonia or pleural effusion.  Lab work today shows no leukocytosis, mild elevation in hemoglobin and hematocrit which  appears to be chronic.  Her hyperglycemia is improved compared to blood work performed 5 months ago.  Serial high-sensitivity troponins are negative. She has a HEART score of 4 with new high-sensitivity troponin algorithm and fairly atypical sounding complaints she is likely stable for discharge home with follow-up with her cardiologist on an outpatient basis.  I doubt PE, dissection, cardiac tamponade, esophageal rupture, pneumothorax, or acute surgical abdominal pathology.  She was given Maalox in the ED and reports she is feeling much better and feels comfortable with discharge home.  She is requesting a refill of her Lexapro until she can be seen by her PCP.  Recommend follow-up with PCP and cardiologist for reevaluation of symptoms.  Discussed strict ED return precautions. Patient verbalized understanding of and agreement with plan and is safe for discharge home at this time.   Final Clinical Impressions(s) / ED Diagnoses   Final diagnoses:  Atypical chest pain  SOB (shortness of breath)  Pain of upper abdomen  Medication refill    ED Discharge Orders         Ordered    escitalopram (LEXAPRO) 20 MG tablet  Daily     08/16/19 2122           Renita Papa, PA-C 08/16/19 2144    Virgel Manifold, MD 08/19/19 1409

## 2019-08-16 NOTE — ED Notes (Signed)
Pt states she has been having indigestion. She was "worried about her heart" because she was having chest pain yesterday. Pt denies any chest pain today, pt denies any radiating pain to arm or back. Pt states she states she also feels like she is lacking energy and feels light headed upon standing.

## 2019-08-29 ENCOUNTER — Encounter: Payer: Self-pay | Admitting: Gastroenterology

## 2019-09-16 NOTE — Progress Notes (Deleted)
Referring Provider: Bridget Hartshorn, NP Primary Care Physician:  Bridget Hartshorn, NP Primary Gastroenterologist:  Dr. Rayne Du chief complaint on file.   HPI:   [name] is a 58 y.o. female presenting today at the request of [name], [name], NP for IBS symptoms.  Patient saw PCP on 08/24/2019 and reported watery diarrhea and intermittent abdominal cramping for months.  Patient requested to see gastroenterology for further work-up.  Stated she was due for colonoscopy anyway.  Patient was recently seen in the ED on 08/16/2019 for atypical chest pain.  She was complaining of acute onset intermittent chest pain, shortness of breath, and generalized weakness.  Stated midline chest pains similar to indigestion.  Also with nausea and intermittent upper abdominal cramping.  EKG without any acute ischemic abnormalities and chest x-ray with no acute cardiopulmonary abnormalities or evidence of pneumonia or pleural effusion.  Troponin negative x2.  Lipase normal.  CMP remarkable for glucose 261.  CBC with hemoglobin 16.2.  She received Maalox in the ED and felt better.  She was advised to follow-up with PCP and cardiologist.  She has not seen cardiology since 2017.  Today she states    Past Medical History:  Diagnosis Date  . Asthma   . COPD (chronic obstructive pulmonary disease) ([place])   . Depression   . Diabetes mellitus   . Diastolic dysfunction 01/13/7866  . Hypertension   . Prolonged QT interval 05/14/2015    Past Surgical History:  Procedure Laterality Date  . CATARACT EXTRACTION    . CESAREAN SECTION    . CHOLECYSTECTOMY      Current Outpatient Medications  Medication Sig Dispense Refill  . albuterol (PROAIR HFA) 108 (90 BASE) MCG/ACT inhaler Inhale 2 puffs into the lungs every 6 (six) hours as needed for wheezing or shortness of breath.    . Blood Glucose Monitoring Suppl ([place]) w/Device KIT by Does not apply route.    . dicyclomine  (BENTYL) 20 MG tablet Take 20 mg by mouth 3 (three) times daily.     . diphenhydrAMINE (BENADRYL) 25 MG tablet Take 25 mg by mouth every 6 (six) hours as needed for allergies.    Marland Kitchen escitalopram (LEXAPRO) 20 MG tablet Take 1 tablet (20 mg total) by mouth daily. 7 tablet 0  . esomeprazole (NEXIUM) 20 MG capsule Take 20 mg by mouth daily at 12 noon.    . insulin aspart protamine- aspart (NOVOLOG MIX 70/30) (70-30) 100 UNIT/ML injection Inject 60 Units into the skin 2 (two) times daily with a meal.    . meclizine (ANTIVERT) 12.5 MG tablet Take 12.5 mg by mouth 2 (two) times daily as needed for dizziness.     . potassium chloride SA (K-DUR) 20 MEQ tablet Take 20 mEq by mouth 2 (two) times daily.    . propranolol ER (INDERAL LA) 120 MG 24 hr capsule Take 120 mg by mouth at bedtime.     . torsemide (DEMADEX) 20 MG tablet Take 1 tablet (20 mg total) by mouth 2 (two) times daily. (Patient not taking: Reported on 08/16/2019) 60 tablet 0  . torsemide (DEMADEX) 20 MG tablet Take 20 mg by mouth 2 (two) times daily.      No current facility-administered medications for this visit.     Allergies as of 09/17/2019 - Review Complete 08/16/2019  Allergen Reaction Noted  . Benicar [olmesartan] Swelling 07/22/2014  . Codeine Other (See Comments) 08/20/2011  . Sulfa antibiotics Swelling 08/20/2011  .  Trulicity [dulaglutide] Diarrhea 09/07/2017    Family History  Problem Relation Age of Onset  . Stroke Mother   . Diabetes Father   . Heart failure Father   . Hypertension Father   . Diabetes Sister   . Heart failure Sister   . Hypertension Sister   . Stroke Sister   . Cancer Other   . Stroke Sister     Social History   Socioeconomic History  . Marital status: Married    Spouse name: Not on file  . Number of children: Not on file  . Years of education: Not on file  . Highest education level: Not on file  Occupational History  . Not on file  Social Needs  . Financial resource strain: Not on file   . Food insecurity    Worry: Not on file    Inability: Not on file  . Transportation needs    Medical: Not on file    Non-medical: Not on file  Tobacco Use  . Smoking status: Passive Smoke Exposure - Never Smoker  . Smokeless tobacco: Never Used  Substance and Sexual Activity  . Alcohol use: No    Alcohol/week: 0.0 standard drinks  . Drug use: No  . Sexual activity: Yes    Birth control/protection: None  Lifestyle  . Physical activity    Days per week: Not on file    Minutes per session: Not on file  . Stress: Not on file  Relationships  . Social Herbalist on phone: Not on file    Gets together: Not on file    Attends religious service: Not on file    Active member of club or organization: Not on file    Attends meetings of clubs or organizations: Not on file    Relationship status: Not on file  . Intimate partner violence    Fear of current or ex partner: Not on file    Emotionally abused: Not on file    Physically abused: Not on file    Forced sexual activity: Not on file  Other Topics Concern  . Not on file  Social History Narrative  . Not on file    Review of Systems: Gen: Denies any fever, chills, fatigue, weight loss, lack of appetite.  CV: Denies chest pain, heart palpitations, peripheral edema, syncope.  Resp: Denies shortness of breath at rest or with exertion. Denies wheezing or cough.  GI: Denies dysphagia or odynophagia. Denies jaundice, hematemesis, fecal incontinence. GU : Denies urinary burning, urinary frequency, urinary hesitancy MS: Denies joint pain, muscle weakness, cramps, or limitation of movement.  Derm: Denies rash, itching, dry skin Psych: Denies depression, anxiety, memory loss, and confusion Heme: Denies bruising, bleeding, and enlarged lymph nodes.  Physical Exam: LMP 08/06/2012  General:   Alert and oriented. Pleasant and cooperative. Well-nourished and well-developed.  Head:  Normocephalic and atraumatic. Eyes:  Without  icterus, sclera clear and conjunctiva pink.  Ears:  Normal auditory acuity. Nose:  No deformity, discharge,  or lesions. Mouth:  No deformity or lesions, oral mucosa pink.  Neck:  Supple, without mass or thyromegaly. Lungs:  Clear to auscultation bilaterally. No wheezes, rales, or rhonchi. No distress.  Heart:  S1, S2 present without murmurs appreciated.  Abdomen:  +BS, soft, non-tender and non-distended. No HSM noted. No guarding or rebound. No masses appreciated.  Rectal:  Deferred  Msk:  Symmetrical without gross deformities. Normal posture. Pulses:  Normal pulses noted. Extremities:  Without clubbing or edema.  Neurologic:  Alert and  oriented x4;  grossly normal neurologically. Skin:  Intact without significant lesions or rashes. Cervical Nodes:  No significant cervical adenopathy. Psych:  Alert and cooperative. Normal mood and affect.

## 2019-09-17 ENCOUNTER — Encounter: Payer: Self-pay | Admitting: Gastroenterology

## 2019-09-17 ENCOUNTER — Telehealth: Payer: Self-pay | Admitting: Gastroenterology

## 2019-09-17 ENCOUNTER — Ambulatory Visit: Payer: BC Managed Care – PPO | Admitting: Gastroenterology

## 2019-09-17 NOTE — Telephone Encounter (Signed)
Patient was a no show and letter sent  °

## 2019-09-20 ENCOUNTER — Emergency Department (HOSPITAL_COMMUNITY): Payer: BC Managed Care – PPO

## 2019-09-20 ENCOUNTER — Other Ambulatory Visit: Payer: Self-pay

## 2019-09-20 ENCOUNTER — Encounter (HOSPITAL_COMMUNITY): Payer: Self-pay | Admitting: Emergency Medicine

## 2019-09-20 ENCOUNTER — Emergency Department (HOSPITAL_COMMUNITY)
Admission: EM | Admit: 2019-09-20 | Discharge: 2019-09-20 | Disposition: A | Discharge status: Home / Self Care | Payer: BC Managed Care – PPO | Attending: Emergency Medicine | Admitting: Emergency Medicine

## 2019-09-20 DIAGNOSIS — I5032 Chronic diastolic (congestive) heart failure: Secondary | ICD-10-CM | POA: Diagnosis not present

## 2019-09-20 DIAGNOSIS — J449 Chronic obstructive pulmonary disease, unspecified: Secondary | ICD-10-CM | POA: Insufficient documentation

## 2019-09-20 DIAGNOSIS — Z79899 Other long term (current) drug therapy: Secondary | ICD-10-CM | POA: Diagnosis not present

## 2019-09-20 DIAGNOSIS — I110 Hypertensive heart disease with heart failure: Secondary | ICD-10-CM | POA: Diagnosis not present

## 2019-09-20 DIAGNOSIS — R0602 Shortness of breath: Secondary | ICD-10-CM | POA: Diagnosis not present

## 2019-09-20 DIAGNOSIS — R32 Unspecified urinary incontinence: Secondary | ICD-10-CM | POA: Diagnosis not present

## 2019-09-20 DIAGNOSIS — Z7722 Contact with and (suspected) exposure to environmental tobacco smoke (acute) (chronic): Secondary | ICD-10-CM | POA: Diagnosis not present

## 2019-09-20 DIAGNOSIS — E119 Type 2 diabetes mellitus without complications: Secondary | ICD-10-CM | POA: Diagnosis not present

## 2019-09-20 LAB — CBC WITH DIFFERENTIAL/PLATELET
Abs Immature Granulocytes: 0.02 10*3/uL (ref 0.00–0.07)
Basophils Absolute: 0.1 10*3/uL (ref 0.0–0.1)
Basophils Relative: 1 %
Eosinophils Absolute: 0.4 10*3/uL (ref 0.0–0.5)
Eosinophils Relative: 4 %
HCT: 47.3 % — ABNORMAL HIGH (ref 36.0–46.0)
Hemoglobin: 16 g/dL — ABNORMAL HIGH (ref 12.0–15.0)
Immature Granulocytes: 0 %
Lymphocytes Relative: 23 %
Lymphs Abs: 1.9 10*3/uL (ref 0.7–4.0)
MCH: 29.1 pg (ref 26.0–34.0)
MCHC: 33.8 g/dL (ref 30.0–36.0)
MCV: 86.2 fL (ref 80.0–100.0)
Monocytes Absolute: 0.6 10*3/uL (ref 0.1–1.0)
Monocytes Relative: 7 %
Neutro Abs: 5.6 10*3/uL (ref 1.7–7.7)
Neutrophils Relative %: 65 %
Platelets: 289 10*3/uL (ref 150–400)
RBC: 5.49 MIL/uL — ABNORMAL HIGH (ref 3.87–5.11)
RDW: 13 % (ref 11.5–15.5)
WBC: 8.5 10*3/uL (ref 4.0–10.5)
nRBC: 0 % (ref 0.0–0.2)

## 2019-09-20 LAB — URINALYSIS, ROUTINE W REFLEX MICROSCOPIC
Bilirubin Urine: NEGATIVE
Glucose, UA: >=500 mg/dL — AB
Ketones, ur: 20 mg/dL — AB
Leukocytes,Ua: NEGATIVE
Nitrite: NEGATIVE
Protein, ur: 100 mg/dL — AB
Specific Gravity, Urine: 1.014 (ref 1.005–1.030)
pH: 6 (ref 5.0–8.0)

## 2019-09-20 LAB — BASIC METABOLIC PANEL
Anion gap: 12 (ref 5–15)
BUN: 9 mg/dL (ref 6–20)
CO2: 29 mmol/L (ref 22–32)
Calcium: 9 mg/dL (ref 8.9–10.3)
Chloride: 95 mmol/L — ABNORMAL LOW (ref 98–111)
Creatinine, Ser: 0.98 mg/dL (ref 0.44–1.00)
GFR calc Af Amer: >60 mL/min (ref >60)
GFR calc non Af Amer: >60 mL/min (ref >60)
Glucose, Bld: 324 mg/dL — ABNORMAL HIGH (ref 70–99)
Potassium: 3.2 mmol/L — ABNORMAL LOW (ref 3.5–5.1)
Sodium: 136 mmol/L (ref 135–145)

## 2019-09-20 IMAGING — DX DG CHEST 2V
2 series · 2 of 2 positions shown · non-contrast
Comparison: Radiograph [DATE]

CLINICAL DATA: Shortness of breath

EXAM:
CHEST - 2 VIEW

[chest pa]
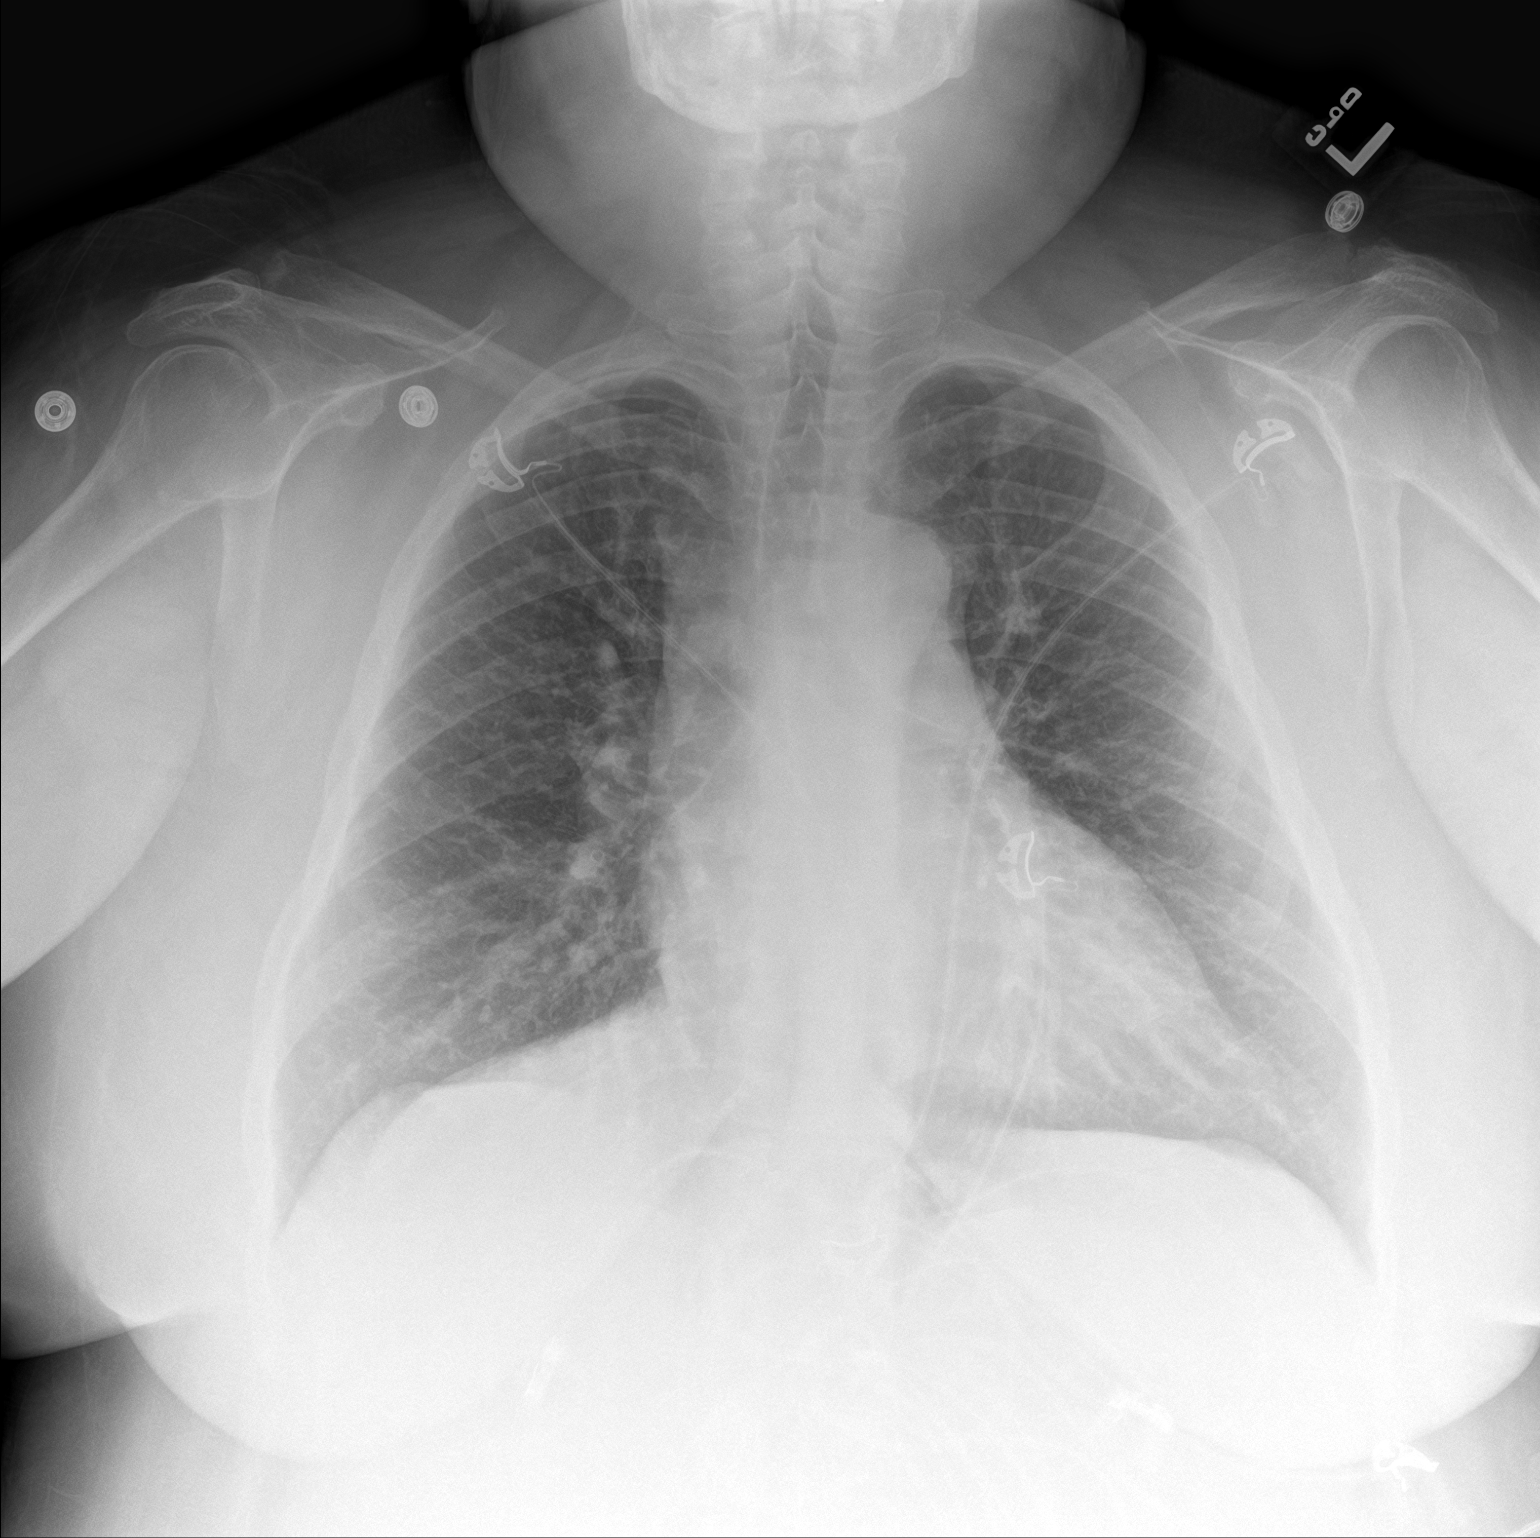

[chest lat]
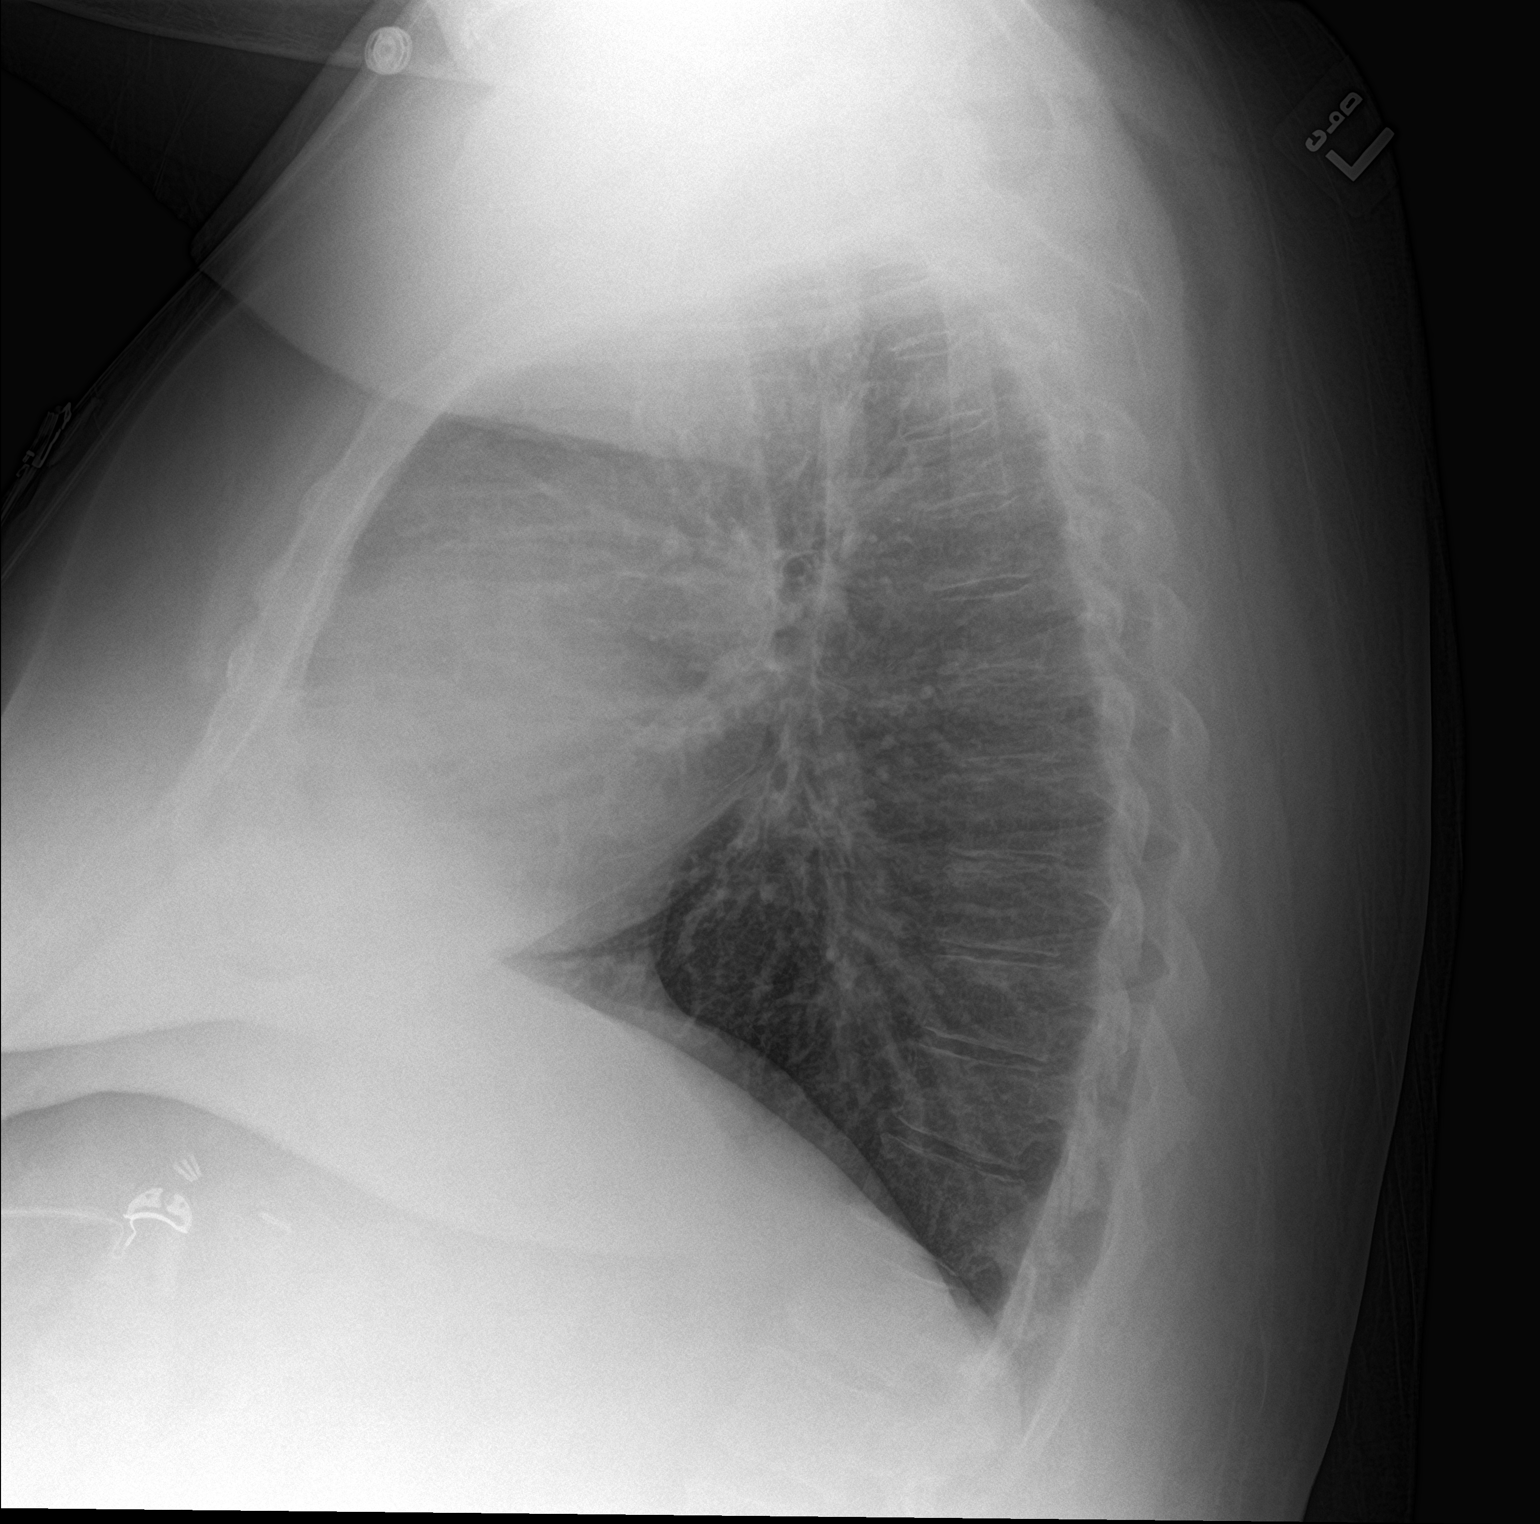

[2 of 2 positions shown; findings below may reference images not displayed]

FINDINGS: No consolidation, features of edema, pneumothorax, or effusion.
Pulmonary vascularity is normally distributed. The cardiomediastinal
contours are unremarkable. No acute osseous or soft tissue
abnormality. Cholecystectomy clips in the right upper quadrant.
IMPRESSION: No acute cardiopulmonary abnormality.

## 2019-09-20 MED ORDER — ALBUTEROL SULFATE (2.5 MG/3ML) 0.083% IN NEBU: 5.0000 mg | INHALATION_SOLUTION | Freq: Once | RESPIRATORY_TRACT | Status: DC

## 2019-09-20 NOTE — ED Provider Notes (Signed)
[facility] EMERGENCY DEPARTMENT Provider Note   CSN: [id number] Arrival date & time: 09/20/19  0217     History   Chief Complaint Chief Complaint  Patient presents with   Shortness of Breath    HPI [name] is a 58 y.o. female.     The history is provided by the patient.  Shortness of Breath Severity:  Mild Onset quality:  Gradual Timing:  Constant Progression:  Unchanged Chronicity:  Recurrent Relieved by:  Nothing Worsened by:  Nothing Associated symptoms: no chest pain, no cough and no fever   Patient presents for multiple complaints. #1 -she reports shortness of breath over the past day.  No fevers or cough.  No active chest pain.  She does report a history of asthma  #2-patient also reports continued urinary incontinence.  She reports she has follow-up with urology for kidney evaluation but she is unclear what the test of shown.  She is concerned that her kidneys were failing and that she may have poisoned her blood  #3-patient also reports over the past several weeks she has been having fecal incontinence.  She has not had this evaluated.  Patient denies fever, no new back pain.  She reports generalized weakness, but no focal leg weakness.  She can still ambulate.  She reports she does have the urge to urinate but is unable to make it to the restroom most times  Past Medical History:  Diagnosis Date   Asthma    COPD (chronic obstructive pulmonary disease) ([place])    Depression    Diabetes mellitus    Diastolic dysfunction 06/06/6202   Hypertension    Prolonged QT interval 05/14/2015    Patient Active Problem List   Diagnosis Date Noted   Diastolic dysfunction 55/97/4163   Prolonged QT interval 05/14/2015   Palpitations 07/23/2014   Generalized weakness 07/23/2014   Hypokalemia 07/22/2014   Diabetes mellitus ([place]) 07/22/2014   Hypertension 07/22/2014   COPD (chronic obstructive pulmonary disease) ([place]) 07/22/2014   Depression  07/22/2014    Past Surgical History:  Procedure Laterality Date   CATARACT EXTRACTION     CESAREAN SECTION     CHOLECYSTECTOMY       OB History    Gravida  3   Para  2   Term  2   Preterm      AB  1   Living  2     SAB  1   TAB      Ectopic      Multiple      Live Births               Home Medications    Prior to Admission medications   Medication Sig Start Date End Date Taking? Authorizing Provider  albuterol (PROAIR HFA) 108 (90 BASE) MCG/ACT inhaler Inhale 2 puffs into the lungs every 6 (six) hours as needed for wheezing or shortness of breath.    [provider]  Blood Glucose Monitoring Suppl ([place]) w/Device KIT by Does not apply route. 12/07/17   [provider]  dicyclomine (BENTYL) 20 MG tablet Take 20 mg by mouth 3 (three) times daily.  02/21/18   [provider]  diphenhydrAMINE (BENADRYL) 25 MG tablet Take 25 mg by mouth every 6 (six) hours as needed for allergies.    [provider]  escitalopram (LEXAPRO) 20 MG tablet Take 1 tablet (20 mg total) by mouth daily. 08/16/19   Renita Papa, PA-C  esomeprazole (NEXIUM) 20 MG capsule Take 20 mg by mouth daily at 12 noon.    [provider]  insulin aspart protamine- aspart (NOVOLOG MIX 70/30) (70-30) 100 UNIT/ML injection Inject 60 Units into the skin 2 (two) times daily with a meal.    [provider]  meclizine (ANTIVERT) 12.5 MG tablet Take 12.5 mg by mouth 2 (two) times daily as needed for dizziness.  12/25/18   [provider]  potassium chloride SA (K-DUR) 20 MEQ tablet Take 20 mEq by mouth 2 (two) times daily.    [provider]  propranolol ER (INDERAL LA) 120 MG 24 hr capsule Take 120 mg by mouth at bedtime.     [provider]  torsemide (DEMADEX) 20 MG tablet Take 1 tablet (20 mg total) by mouth 2 (two) times daily. Patient not taking: Reported on 08/16/2019 05/16/15   Samuella Cota, MD    torsemide (DEMADEX) 20 MG tablet Take 20 mg by mouth 2 (two) times daily.  03/28/18   [provider]    Family History Family History  Problem Relation Age of Onset   Stroke Mother    Diabetes Father    Heart failure Father    Hypertension Father    Diabetes Sister    Heart failure Sister    Hypertension Sister    Stroke Sister    Cancer Other    Stroke Sister     Social History Social History   Tobacco Use   Smoking status: Passive Smoke Exposure - Never Smoker   Smokeless tobacco: Never Used  Substance Use Topics   Alcohol use: No    Alcohol/week: 0.0 standard drinks   Drug use: No     Allergies   Benicar [olmesartan], Codeine, Sulfa antibiotics, and Trulicity [dulaglutide]   Review of Systems Review of Systems  Constitutional: Positive for fatigue. Negative for fever.  Respiratory: Positive for shortness of breath. Negative for cough.   Cardiovascular: Negative for chest pain.  Genitourinary: Positive for dysuria.       Urinary incontinence  Musculoskeletal: Negative for back pain.  All other systems reviewed and are negative.    Physical Exam Updated Vital Signs BP (!) 184/90    Pulse 88    Temp 98.2 F (36.8 C) (Oral)    Resp 14    Ht 1.575 m (_0 )    Wt 136.1 kg    LMP 08/06/2012    SpO2 99%    BMI 54.87 kg/m   Physical Exam CONSTITUTIONAL: Well developed/well nourished, no acute distress HEAD: Normocephalic/atraumatic EYES: EOMI ENMT: Mucous membranes moist NECK: supple no meningeal signs SPINE/BACK:entire spine nontender CV: S1/S2 noted, no murmurs/rubs/gallops noted LUNGS: Lungs are clear to auscultation bilaterally, no apparent distress ABDOMEN: soft, nontender, no rebound or guarding, bowel sounds noted throughout abdomen, obese GU:no cva tenderness NEURO: Awake/alert, no saddle anesthesia, rectal tone present (chaperone present), equal distal motor 5/5 strength noted with the following: hip flexion/knee  flexion/extension, ankle dorsi/plantar flexion, great toe extension intact bilaterally, no clonus bilaterally,  no sensory deficit in any dermatome. Pt is able to ambulate unassisted.  EXTREMITIES: pulses normal/equal, full ROM SKIN: warm, color normal PSYCH: no abnormalities of mood noted, alert and oriented to situation   ED Treatments / Results  Labs (all labs ordered are listed, but only abnormal results are displayed) Labs Reviewed  BASIC METABOLIC PANEL - Abnormal; Notable for the following components:      Result Value   Potassium 3.2 (*)  Chloride 95 (*)    Glucose, Bld 324 (*)    All other components within normal limits  CBC WITH DIFFERENTIAL/PLATELET - Abnormal; Notable for the following components:   RBC 5.49 (*)    Hemoglobin 16.0 (*)    HCT 47.3 (*)    All other components within normal limits  URINALYSIS, ROUTINE W REFLEX MICROSCOPIC - Abnormal; Notable for the following components:   Glucose, UA >=500 (*)    Hgb urine dipstick SMALL (*)    Ketones, ur 20 (*)    Protein, ur 100 (*)    Bacteria, UA RARE (*)    All other components within normal limits    EKG EKG Interpretation  Date/Time:  Thursday September 20 2019 02:44:07 EDT Ventricular Rate:  88 PR Interval:    QRS Duration: 97 QT Interval:  437 QTC Calculation: 529 R Axis:   -35 Text Interpretation:  Sinus rhythm Left axis deviation Low voltage, precordial leads Consider anterior infarct Minimal ST depression, lateral leads Prolonged QT interval Baseline wander in lead(s) V2 V3 Abnormal ekg Interpretation limited secondary to artifact Confirmed by Ripley Fraise [id number]) on 09/20/2019 2:46:21 AM   Radiology Dg Chest 2 View  Result Date: 09/20/2019 CLINICAL DATA:  Shortness of breath EXAM: CHEST - 2 VIEW COMPARISON:  Radiograph 09/15/2019 FINDINGS: No consolidation, features of edema, pneumothorax, or effusion. Pulmonary vascularity is normally distributed. The cardiomediastinal contours are  unremarkable. No acute osseous or soft tissue abnormality. Cholecystectomy clips in the right upper quadrant. IMPRESSION: No acute cardiopulmonary abnormality. Electronically Signed   By: Lovena Le M.D.   On: 09/20/2019 03:58    Procedures Procedures    Medications Ordered in ED Medications - No data to display   Initial Impression / Assessment and Plan / ED Course  I have reviewed the triage vital signs and the nursing notes.  Pertinent labs & imaging results that were available during my care of the patient were reviewed by me and considered in my medical decision making (see chart for details).        3:18 AM Patient presents for multiple complaints.  She initially endorsed shortness of breath, but then reveals that her most pressing concern is that her kidneys are failing.  She reports an evaluation by urology but is unsure what those tests have revealed.  She reports recent urinary incontinence which triggered the urology evaluation.  She is also reporting fecal incontinence but has not had this evaluated.  She denies back pain and has no motor deficits or lower extremities.  Low suspicion for cauda equina syndrome. Labs/imaging been ordered.  We will also check postvoid residual 4:13 AM  Renal function is appropriate at this time.  Chest x-ray is negative.  No signs of UTI.  She does have hyperglycemia without anion gap.  She is in no acute distress and vitals reveal no hypoxia Patient is feeling improved She is appropriate for d/c home  Final Clinical Impressions(s) / ED Diagnoses   Final diagnoses:  Shortness of breath  Urinary incontinence, unspecified type    ED Discharge Orders    None       Ripley Fraise, MD 09/20/19 [phone]

## 2019-09-20 NOTE — ED Triage Notes (Signed)
Pt with c/o shortness of breath and urinary incontinence since yesterday.States she "thinks my kidneys are failing".

## 2019-09-25 ENCOUNTER — Other Ambulatory Visit: Payer: Self-pay

## 2019-09-25 ENCOUNTER — Emergency Department (HOSPITAL_COMMUNITY): Payer: BC Managed Care – PPO

## 2019-09-25 ENCOUNTER — Emergency Department (HOSPITAL_COMMUNITY)
Admission: EM | Admit: 2019-09-25 | Discharge: 2019-09-26 | Disposition: A | Discharge status: Home / Self Care | Payer: BC Managed Care – PPO | Source: Home / Self Care | Attending: Emergency Medicine | Admitting: Emergency Medicine

## 2019-09-25 ENCOUNTER — Encounter (HOSPITAL_COMMUNITY): Payer: Self-pay | Admitting: Clinical

## 2019-09-25 DIAGNOSIS — Z79899 Other long term (current) drug therapy: Secondary | ICD-10-CM | POA: Insufficient documentation

## 2019-09-25 DIAGNOSIS — I5032 Chronic diastolic (congestive) heart failure: Secondary | ICD-10-CM | POA: Insufficient documentation

## 2019-09-25 DIAGNOSIS — R443 Hallucinations, unspecified: Secondary | ICD-10-CM

## 2019-09-25 DIAGNOSIS — E119 Type 2 diabetes mellitus without complications: Secondary | ICD-10-CM | POA: Insufficient documentation

## 2019-09-25 DIAGNOSIS — E876 Hypokalemia: Secondary | ICD-10-CM | POA: Insufficient documentation

## 2019-09-25 DIAGNOSIS — I110 Hypertensive heart disease with heart failure: Secondary | ICD-10-CM | POA: Insufficient documentation

## 2019-09-25 DIAGNOSIS — E1110 Type 2 diabetes mellitus with ketoacidosis without coma: Secondary | ICD-10-CM | POA: Diagnosis not present

## 2019-09-25 DIAGNOSIS — R410 Disorientation, unspecified: Secondary | ICD-10-CM | POA: Diagnosis not present

## 2019-09-25 DIAGNOSIS — F23 Brief psychotic disorder: Secondary | ICD-10-CM | POA: Insufficient documentation

## 2019-09-25 DIAGNOSIS — Z7722 Contact with and (suspected) exposure to environmental tobacco smoke (acute) (chronic): Secondary | ICD-10-CM | POA: Insufficient documentation

## 2019-09-25 DIAGNOSIS — J441 Chronic obstructive pulmonary disease with (acute) exacerbation: Secondary | ICD-10-CM

## 2019-09-25 DIAGNOSIS — R9431 Abnormal electrocardiogram [ECG] [EKG]: Secondary | ICD-10-CM

## 2019-09-25 DIAGNOSIS — Z794 Long term (current) use of insulin: Secondary | ICD-10-CM | POA: Insufficient documentation

## 2019-09-25 LAB — URINALYSIS, ROUTINE W REFLEX MICROSCOPIC
Bilirubin Urine: NEGATIVE
Glucose, UA: 50 mg/dL — AB
Ketones, ur: NEGATIVE mg/dL
Leukocytes,Ua: NEGATIVE
Nitrite: NEGATIVE
Protein, ur: >=300 mg/dL — AB
Specific Gravity, Urine: 1.014 (ref 1.005–1.030)
pH: 5 (ref 5.0–8.0)

## 2019-09-25 LAB — BLOOD GAS, ARTERIAL
Acid-Base Excess: 7.6 mmol/L — ABNORMAL HIGH (ref 0.0–2.0)
Bicarbonate: 30.4 mmol/L — ABNORMAL HIGH (ref 20.0–28.0)
FIO2: 24
O2 Saturation: 95.6 %
Patient temperature: 37
pCO2 arterial: 50.8 mmHg — ABNORMAL HIGH (ref 32.0–48.0)
pH, Arterial: 7.419 (ref 7.350–7.450)
pO2, Arterial: 81.3 mmHg — ABNORMAL LOW (ref 83.0–108.0)

## 2019-09-25 LAB — CBC WITH DIFFERENTIAL/PLATELET
Abs Immature Granulocytes: 0.02 10*3/uL (ref 0.00–0.07)
Basophils Absolute: 0.1 10*3/uL (ref 0.0–0.1)
Basophils Relative: 1 %
Eosinophils Absolute: 0.3 10*3/uL (ref 0.0–0.5)
Eosinophils Relative: 4 %
HCT: 48.7 % — ABNORMAL HIGH (ref 36.0–46.0)
Hemoglobin: 16.4 g/dL — ABNORMAL HIGH (ref 12.0–15.0)
Immature Granulocytes: 0 %
Lymphocytes Relative: 22 %
Lymphs Abs: 1.7 10*3/uL (ref 0.7–4.0)
MCH: 29 pg (ref 26.0–34.0)
MCHC: 33.7 g/dL (ref 30.0–36.0)
MCV: 86 fL (ref 80.0–100.0)
Monocytes Absolute: 0.4 10*3/uL (ref 0.1–1.0)
Monocytes Relative: 5 %
Neutro Abs: 5.3 10*3/uL (ref 1.7–7.7)
Neutrophils Relative %: 68 %
Platelets: 283 10*3/uL (ref 150–400)
RBC: 5.66 MIL/uL — ABNORMAL HIGH (ref 3.87–5.11)
RDW: 13.1 % (ref 11.5–15.5)
WBC: 7.8 10*3/uL (ref 4.0–10.5)
nRBC: 0 % (ref 0.0–0.2)

## 2019-09-25 LAB — BASIC METABOLIC PANEL
Anion gap: 14 (ref 5–15)
BUN: 8 mg/dL (ref 6–20)
CO2: 26 mmol/L (ref 22–32)
Calcium: 8.8 mg/dL — ABNORMAL LOW (ref 8.9–10.3)
Chloride: 95 mmol/L — ABNORMAL LOW (ref 98–111)
Creatinine, Ser: 0.89 mg/dL (ref 0.44–1.00)
GFR calc Af Amer: >60 mL/min (ref >60)
GFR calc non Af Amer: >60 mL/min (ref >60)
Glucose, Bld: 219 mg/dL — ABNORMAL HIGH (ref 70–99)
Potassium: 2.6 mmol/L — CL (ref 3.5–5.1)
Sodium: 135 mmol/L (ref 135–145)

## 2019-09-25 LAB — AMMONIA: Ammonia: 29 umol/L (ref 9–35)

## 2019-09-25 LAB — MAGNESIUM: Magnesium: 1.7 mg/dL (ref 1.7–2.4)

## 2019-09-25 LAB — CBG MONITORING, ED: Glucose-Capillary: 196 mg/dL — ABNORMAL HIGH (ref 70–99)

## 2019-09-25 LAB — BRAIN NATRIURETIC PEPTIDE: B Natriuretic Peptide: 29 pg/mL (ref 0.0–100.0)

## 2019-09-25 LAB — TSH: TSH: 2.772 u[IU]/mL (ref 0.350–4.500)

## 2019-09-25 IMAGING — CR DG CHEST 1V PORT
1 series · 1 of 1 positions shown · non-contrast
Comparison: Radiograph [DATE]

CLINICAL DATA: Shortness of breath for 2 hours

EXAM:
PORTABLE CHEST 1 VIEW

[portable]
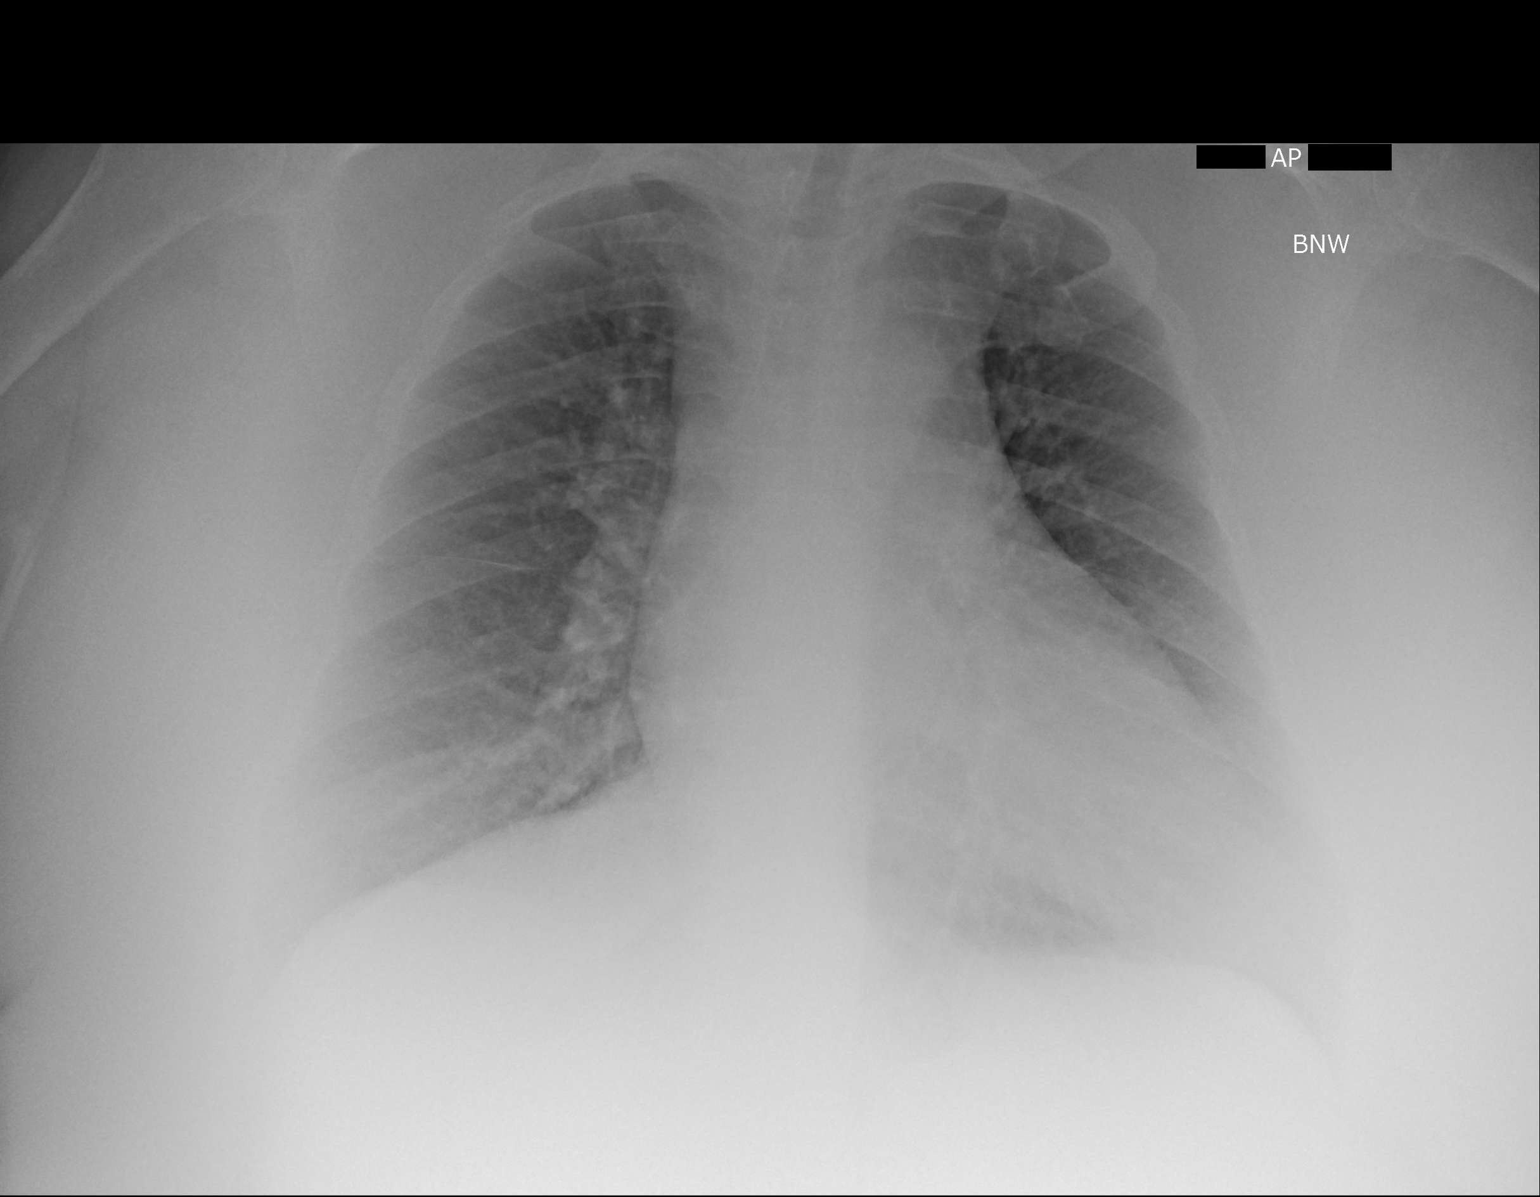

[1 of 1 positions shown; findings below may reference images not displayed]

FINDINGS: Imaging quality degraded by patient body habitus. Increasing hazy
interstitial opacities with some interval cephalization of the
vascularity. Prominent cardiac silhouette may in part be due to the
portable technique. No pneumothorax or effusion. No acute osseous or
soft tissue abnormality.
IMPRESSION: 1. Increasing hazy interstitial opacities with some interval
cephalization of the vascularity, suspicious for interstitial edema.
No pleural effusion.
2. Imaging quality degraded by patient body habitus.

## 2019-09-25 MED ORDER — POTASSIUM CHLORIDE CRYS ER 20 MEQ PO TBCR
20.0000 meq | EXTENDED_RELEASE_TABLET | Freq: Two times a day (BID) | ORAL | Status: DC
  Administered 2019-09-26: 20 meq via ORAL
  Filled 2019-09-25: qty 1

## 2019-09-25 MED ORDER — POTASSIUM CHLORIDE CRYS ER 20 MEQ PO TBCR
40.0000 meq | EXTENDED_RELEASE_TABLET | Freq: Once | ORAL | Status: AC
  Administered 2019-09-25: 40 meq via ORAL
  Filled 2019-09-25: qty 2

## 2019-09-25 MED ORDER — DICYCLOMINE HCL 20 MG PO TABS
20.0000 mg | ORAL_TABLET | Freq: Three times a day (TID) | ORAL | Status: DC
  Filled 2019-09-25 (×2): qty 1

## 2019-09-25 MED ORDER — RISPERIDONE 1 MG PO TBDP
2.0000 mg | ORAL_TABLET | Freq: Three times a day (TID) | ORAL | Status: DC | PRN
  Administered 2019-09-25: 2 mg via ORAL
  Filled 2019-09-25: qty 2

## 2019-09-25 MED ORDER — ALBUTEROL SULFATE HFA 108 (90 BASE) MCG/ACT IN AERS
4.0000 | INHALATION_SPRAY | Freq: Once | RESPIRATORY_TRACT | Status: AC
  Administered 2019-09-25: 4 via RESPIRATORY_TRACT
  Filled 2019-09-25: qty 6.7

## 2019-09-25 MED ORDER — LIDOCAINE HCL (PF) 1 % IJ SOLN
INTRAMUSCULAR | Status: AC
  Filled 2019-09-25: qty 2

## 2019-09-25 MED ORDER — INSULIN ASPART PROT & ASPART (70-30 MIX) 100 UNIT/ML ~~LOC~~ SUSP
60.0000 [IU] | Freq: Two times a day (BID) | SUBCUTANEOUS | Status: DC
  Filled 2019-09-25 (×2): qty 10

## 2019-09-25 MED ORDER — STERILE WATER FOR INJECTION IJ SOLN
INTRAMUSCULAR | Status: AC
  Administered 2019-09-25: 18:00:00
  Filled 2019-09-25: qty 10

## 2019-09-25 MED ORDER — ZIPRASIDONE MESYLATE 20 MG IM SOLR
20.0000 mg | INTRAMUSCULAR | Status: AC | PRN
  Administered 2019-09-25: 20 mg via INTRAMUSCULAR
  Filled 2019-09-25: qty 20

## 2019-09-25 MED ORDER — ACETAMINOPHEN 325 MG PO TABS
650.0000 mg | ORAL_TABLET | ORAL | Status: DC | PRN
  Administered 2019-09-25: 650 mg via ORAL
  Filled 2019-09-25: qty 2

## 2019-09-25 MED ORDER — LORAZEPAM 1 MG PO TABS
1.0000 mg | ORAL_TABLET | ORAL | Status: AC | PRN
  Administered 2019-09-25: 1 mg via ORAL
  Filled 2019-09-25: qty 1

## 2019-09-25 MED ORDER — LORAZEPAM 2 MG/ML IJ SOLN: 1.0000 mg | Freq: Once | INTRAMUSCULAR | Status: DC

## 2019-09-25 MED ORDER — TORSEMIDE 20 MG PO TABS
20.0000 mg | ORAL_TABLET | Freq: Two times a day (BID) | ORAL | Status: DC
  Administered 2019-09-26: 20 mg via ORAL
  Filled 2019-09-25 (×5): qty 1

## 2019-09-25 MED ORDER — PROPRANOLOL HCL ER 120 MG PO CP24
120.0000 mg | ORAL_CAPSULE | Freq: Every day | ORAL | Status: DC
  Filled 2019-09-25 (×3): qty 1

## 2019-09-25 NOTE — ED Notes (Signed)
Sitter called out saying pt O2 sats were fluxuating from 87-90% on RA. Pt placed on 2L [place].

## 2019-09-25 NOTE — ED Notes (Signed)
Date and time results received: 09/25/19 5:07 PM   Test: K Critical Value: 2.6   Name of Provider Notified: Dr. Kathrynn Humble  Orders Received? Or Actions Taken?:see orders

## 2019-09-25 NOTE — ED Provider Notes (Addendum)
[facility] EMERGENCY DEPARTMENT Provider Note   CSN: [id number] Arrival date & time: 09/25/19  1458     History   Chief Complaint Chief Complaint  Patient presents with   Shortness of Breath    HPI [name] is a 58 y.o. female.     HPI  58 year old female with history of CHF, diastolic heart failure, prolonged QTC and COPD comes in a chief complaint of shortness of breath.  Patient reports that about 2 or 3 hours ago she started having sudden onset shortness of breath with wheezing.  She also had some chest tightness.  Her symptoms have improved significantly on their own.  Patient denies any associated cough, nausea, vomiting, fevers, body aches.  She denies any sick exposures.  She has been taking nebulizer treatment as needed.  Pt has no hx of PE, DVT and denies any exogenous hormone (testosterone / estrogen) use, long distance travels or surgery in the past 6 weeks, active cancer, recent immobilization.   Past Medical History:  Diagnosis Date   Asthma    COPD (chronic obstructive pulmonary disease) ([place])    Depression    Diabetes mellitus    Diastolic dysfunction 02/07/1936   Hypertension    Prolonged QT interval 05/14/2015    Patient Active Problem List   Diagnosis Date Noted   Diastolic dysfunction 90/24/0973   Prolonged QT interval 05/14/2015   Palpitations 07/23/2014   Generalized weakness 07/23/2014   Hypokalemia 07/22/2014   Diabetes mellitus ([place]) 07/22/2014   Hypertension 07/22/2014   COPD (chronic obstructive pulmonary disease) ([place]) 07/22/2014   Depression 07/22/2014    Past Surgical History:  Procedure Laterality Date   CATARACT EXTRACTION     CESAREAN SECTION     CHOLECYSTECTOMY       OB History    Gravida  3   Para  2   Term  2   Preterm      AB  1   Living  2     SAB  1   TAB      Ectopic      Multiple      Live Births               Home Medications    Prior to Admission  medications   Medication Sig Start Date End Date Taking? Authorizing Provider  albuterol (PROAIR HFA) 108 (90 BASE) MCG/ACT inhaler Inhale 2 puffs into the lungs every 6 (six) hours as needed for wheezing or shortness of breath.    [provider]  Blood Glucose Monitoring Suppl ([place]) w/Device KIT by Does not apply route. 12/07/17   [provider]  dicyclomine (BENTYL) 20 MG tablet Take 20 mg by mouth 3 (three) times daily.  02/21/18   [provider]  diphenhydrAMINE (BENADRYL) 25 MG tablet Take 25 mg by mouth every 6 (six) hours as needed for allergies.    [provider]  escitalopram (LEXAPRO) 20 MG tablet Take 1 tablet (20 mg total) by mouth daily. 08/16/19   Fawze, Mina A, PA-C  esomeprazole (NEXIUM) 20 MG capsule Take 20 mg by mouth daily at 12 noon.    [provider]  insulin aspart protamine- aspart (NOVOLOG MIX 70/30) (70-30) 100 UNIT/ML injection Inject 60 Units into the skin 2 (two) times daily with a meal.    [provider]  meclizine (ANTIVERT) 12.5 MG tablet Take 12.5 mg by mouth 2 (two) times daily as needed for dizziness.  12/25/18  [provider]  potassium chloride SA (K-DUR) 20 MEQ tablet Take 20 mEq by mouth 2 (two) times daily.    [provider]  propranolol ER (INDERAL LA) 120 MG 24 hr capsule Take 120 mg by mouth at bedtime.     [provider]  torsemide (DEMADEX) 20 MG tablet Take 1 tablet (20 mg total) by mouth 2 (two) times daily. Patient not taking: Reported on 08/16/2019 05/16/15   Samuella Cota, MD  torsemide (DEMADEX) 20 MG tablet Take 20 mg by mouth 2 (two) times daily.  03/28/18   [provider]    Family History Family History  Problem Relation Age of Onset   Stroke Mother    Diabetes Father    Heart failure Father    Hypertension Father    Diabetes Sister    Heart failure Sister    Hypertension Sister    Stroke Sister    Cancer  Other    Stroke Sister     Social History Social History   Tobacco Use   Smoking status: Passive Smoke Exposure - Never Smoker   Smokeless tobacco: Never Used  Substance Use Topics   Alcohol use: No    Alcohol/week: 0.0 standard drinks   Drug use: No     Allergies   Benicar [olmesartan], Codeine, Sulfa antibiotics, and Trulicity [dulaglutide]   Review of Systems Review of Systems  Constitutional: Positive for activity change. Negative for diaphoresis.  Respiratory: Positive for chest tightness and shortness of breath.   Cardiovascular: Negative for chest pain.  Gastrointestinal: Negative for nausea and vomiting.  All other systems reviewed and are negative.    Physical Exam Updated Vital Signs BP 134/65 (BP Location: Right Arm)    Pulse (!) 105    Temp 98.3 F (36.8 C) (Oral)    Resp (!) 25    Ht _0  (1.549 m)    Wt 136.1 kg    LMP 08/06/2012    SpO2 95%    BMI 56.68 kg/m   Physical Exam Vitals signs and nursing note reviewed.  Constitutional:      Appearance: She is well-developed.  HENT:     Head: Normocephalic and atraumatic.  Eyes:     Pupils: Pupils are equal, round, and reactive to light.  Neck:     Musculoskeletal: Neck supple.  Cardiovascular:     Rate and Rhythm: Normal rate and regular rhythm.     Heart sounds: Normal heart sounds.  Pulmonary:     Effort: Pulmonary effort is normal. No tachypnea, accessory muscle usage or respiratory distress.     Breath sounds: No wheezing or rhonchi.  Abdominal:     General: There is no distension.     Palpations: Abdomen is soft.     Tenderness: There is no abdominal tenderness. There is no guarding or rebound.  Musculoskeletal:     Right lower leg: No edema.     Left lower leg: No edema.  Skin:    General: Skin is warm and dry.  Neurological:     Mental Status: She is alert and oriented to person, place, and time.      ED Treatments / Results  Labs (all labs ordered are listed, but only  abnormal results are displayed) Labs Reviewed  BASIC METABOLIC PANEL - Abnormal; Notable for the following components:      Result Value   Potassium 2.6 (*)    Chloride 95 (*)    Glucose, Bld 219 (*)  Calcium 8.8 (*)    All other components within normal limits  CBC WITH DIFFERENTIAL/PLATELET - Abnormal; Notable for the following components:   RBC 5.66 (*)    Hemoglobin 16.4 (*)    HCT 48.7 (*)    All other components within normal limits  URINALYSIS, ROUTINE W REFLEX MICROSCOPIC - Abnormal; Notable for the following components:   APPearance HAZY (*)    Glucose, UA 50 (*)    Hgb urine dipstick SMALL (*)    Protein, ur >=300 (*)    Bacteria, UA RARE (*)    All other components within normal limits  MAGNESIUM  BRAIN NATRIURETIC PEPTIDE  TSH  AMMONIA    EKG EKG Interpretation  Date/Time:  Tuesday September 25 2019 15:12:08 EDT Ventricular Rate:  90 PR Interval:  124 QRS Duration: 90 QT Interval:  450 QTC Calculation: 550 R Axis:   -60 Text Interpretation:  Normal sinus rhythm Left anterior fascicular block Cannot rule out Anterior infarct , age undetermined Prolonged QT Abnormal ECG QTc is prolonged Confirmed by Varney Biles [phone]) on 09/25/2019 3:58:33 PM   Radiology Dg Chest Port 1 View  Result Date: 09/25/2019 CLINICAL DATA:  Shortness of breath for 2 hours EXAM: PORTABLE CHEST 1 VIEW COMPARISON:  Radiograph 09/20/2019 FINDINGS: Imaging quality degraded by patient body habitus. Increasing hazy interstitial opacities with some interval cephalization of the vascularity. Prominent cardiac silhouette may in part be due to the portable technique. No pneumothorax or effusion. No acute osseous or soft tissue abnormality. IMPRESSION: 1. Increasing hazy interstitial opacities with some interval cephalization of the vascularity, suspicious for interstitial edema. No pleural effusion. 2. Imaging quality degraded by patient body habitus. Electronically Signed   By: Lovena Le  M.D.   On: 09/25/2019 16:12    Procedures .Critical Care Performed by: Varney Biles, MD Authorized by: Varney Biles, MD   Critical care provider statement:    Critical care time (minutes):  32   Critical care was necessary to treat or prevent imminent or life-threatening deterioration of the following conditions:  Respiratory failure and CNS failure or compromise   Critical care was time spent personally by me on the following activities:  Discussions with consultants, evaluation of patient's response to treatment, examination of patient, ordering and performing treatments and interventions, ordering and review of laboratory studies, ordering and review of radiographic studies, pulse oximetry, re-evaluation of patient's condition, obtaining history from patient or surrogate and review of old charts   (including critical care time)  Medications Ordered in ED Medications  acetaminophen (TYLENOL) tablet 650 mg (650 mg Oral Given 09/25/19 1808)  risperiDONE (RISPERDAL M-TABS) disintegrating tablet 2 mg (2 mg Oral Given 09/25/19 1808)    And  LORazepam (ATIVAN) tablet 1 mg (1 mg Oral Given 09/25/19 1808)    And  ziprasidone (GEODON) injection 20 mg (20 mg Intramuscular Given 09/25/19 1809)  lidocaine (PF) (XYLOCAINE) 1 % injection (  Not Given 09/25/19 1809)  albuterol (VENTOLIN HFA) 108 (90 Base) MCG/ACT inhaler 4 puff (4 puffs Inhalation Given 09/25/19 1654)  potassium chloride SA (KLOR-CON) CR tablet 40 mEq (40 mEq Oral Given 09/25/19 1808)  sterile water (preservative free) injection (  Given 09/25/19 1809)  potassium chloride SA (KLOR-CON) CR tablet 40 mEq (40 mEq Oral Given 09/25/19 1818)     Initial Impression / Assessment and Plan / ED Course  I have reviewed the triage vital signs and the nursing notes.  Pertinent labs & imaging results that were available during my care of  the patient were reviewed by me and considered in my medical decision making (see chart for  details).  Clinical Course as of Sep 26 33  Tue Sep 25, 2019  1937 Potassium replaced with 100 mEq of oral potassium.  Magnesium is within normal limits.  Potassium(!!): 2.6 [AN]  1937 But I called patient's husband at [phone], Mr. Jaquita Rector told me that patient actually has been having hallucinations for the last several weeks.  He sent her to the ED few days ago for evaluation of the hallucinations but she was perhaps not forthcoming with that history.  Patient was admitted at [facility] in March with similar symptoms.  She has no outpatient psych follow-up.  When patient confronted about that concern she informed me that she indeed has been having hallucinations.  She has no SI, HI.  She denies any headaches.  She has been taking her medications as prescribed.   [AN]  Wed Sep 26, 2019  0710 DG Chest [employer] 1 View [WF]    Clinical Course User Index [AN] Varney Biles, MD [WF] Jerel Shepherd       58 year old female with history of CHF, COPD comes in a chief complaint of acute shortness of breath.  She reports having wheezing associated with her shortness of breath.  On exam she does not have any tachycardia, tachypnea or focal lung abnormalities.  No evidence of JVD or pitting edema.  We will get basic labs including BNP.  Chest x-ray also ordered and will give her nebulizer treatment and reassess.  She denies any COVID-19 exposure or engaging any high risk behavior that can put her at risk for COVID-19.  At the moment I do not see any utility in testing her for it.   [name] was evaluated in Emergency Department on 09/25/2019 for the symptoms described in the history of present illness. She was evaluated in the context of the global COVID-19 pandemic, which necessitated consideration that the patient might be at risk for infection with the SARS-CoV-2 virus that causes COVID-19. Institutional protocols and algorithms that pertain to the evaluation  of patients at risk for COVID-19 are in a state of rapid change based on information released by regulatory bodies including the CDC and federal and state organizations. These policies and algorithms were followed during the patient's care in the ED.   9:27 PM Patient was agitated at 1 point.  She had received Geodon at 6:00.  It seems like the Geodon was given by the nurse whilst patient was still not agitated.  Either way, she got agitated later on in the ED stay.  She on reassessment was sleepy.  She had her O2 sats were 89%.  I have placed on 1 L of oxygen.  She does open her eyes to noxious stimuli.  We will get an ABG to ensure she does not hypercapnia.  I suspect that she has OSA which is causing hypoxia.  Geodon likely contributed.  12:53 AM ABG is reassuring.  She will need reassessment in the morning by psych.  If psych clears and she will need reassessment by medical staff as well to make sure she is oriented.    Final Clinical Impressions(s) / ED Diagnoses   Final diagnoses:  Acute hypokalemia  Prolonged QT interval  Hallucinations    ED Discharge Orders    None           Varney Biles, MD 09/25/19 2135    Varney Biles, MD 09/26/19 [phone]  Varney Biles, MD 09/27/19 [phone]

## 2019-09-25 NOTE — ED Notes (Signed)
O2 placed back on pt at 1L per MD. O2 flux between 88 and 93 %

## 2019-09-25 NOTE — ED Notes (Signed)
Family member called out from room to me in hall way. Pt sitting in chair, talking to pt and she was moaning but not verbally responsive.Pt responsive to painful stimuli ( chest rubbing). Called for RN and grabbed another from the desk.2RNs and 2 NTs transfer pt back to bed.

## 2019-09-25 NOTE — ED Notes (Signed)
Respiratory contacted for peak flow check

## 2019-09-25 NOTE — ED Triage Notes (Signed)
Patient presents to the ED for shortness of breath for 2 hours. Patient denies fever, or covid exposure.

## 2019-09-25 NOTE — BHH Counselor (Signed)
Disposition: Shuvon Rankin, NP recommends patient be observed overnight for safety and stabilization. Psychiatry will reassess when medically stable.

## 2019-09-25 NOTE — ED Notes (Signed)
Pt woke and spoke coherantly to someone on the phone ( pharmacy I believe) asking about medications

## 2019-09-25 NOTE — BH Assessment (Addendum)
Tele Assessment Note   Patient Name: [name] MRN: [id number] Referring Physician: Kathrynn Humble Location of Patient: [facility] ED Location of Provider: [place] is an 58 y.o. female presenting voluntarily to [facility] ED complaining of shortness of breath. EDP requests TTS consult based on collateral information from husband. Patient is pleasant and cooperative during assessment. Patient states she came to the hospital because her husband wanted her to. Patient denies any psychiatric history, despite it being charted. When asked further she states "They think I'm depressed but I'm not." She denies SI/HI. Patient endorses visual hallucinations of people familiar to her, especially when startled, that started about 6 months ago. She states she does not feel distressed by this. She denies any substance use, trauma history, or criminal charges. Patient gave verbal consent for TTS to contact her husband.  Collateral information was obtained from patient's husband, [name] [phone]: Husband reports in January of 2020 she began to show signs of depression. She was not getting out of bed, using the bathroom on herself, and "talking out of her head." Patient went to [place] and was eventually admitted to [facility] for psychiatric treatment. He reports that patient was initially doing well when she came but is now declining. He reports now that patient does not get out of bed, tells him she wants to kill herself, and tells him she is hearing voices and seeing people that are not there. She is not currently seeing a psychiatrist or therapist.   Patient is alert and oriented x 4. She is laying in bed in a hospital gown. Her speech is logical, eye contact is good, and thoughts are organized. Patient's mood is pleasant and her affect is congruent. Patient's insight, judgement, and impulse control are partially impaired. Patient does not appear to be responding to internal stimuli  or experiencing delusional thought content at time of assessment.  Disposition: Shuvon Rankin, NP recommends patient be observed overnight for safety and stabilization. Psychiatry will reassess when medically stable.  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 Asthma   . COPD (chronic obstructive pulmonary disease) ([place])   . Depression   . Diabetes mellitus   . Diastolic dysfunction [id number]  . Hypertension   . Prolonged QT interval 05/14/2015    Past Surgical History:  Procedure Laterality Date  . CATARACT EXTRACTION    . CESAREAN SECTION    . CHOLECYSTECTOMY      Family History:  Family History  Problem Relation Age of Onset  . Stroke Mother   . Diabetes Father   . Heart failure Father   . Hypertension Father   . Diabetes Sister   . Heart failure Sister   . Hypertension Sister   . Stroke Sister   . Cancer Other   . Stroke Sister     Social History:  reports that she is a non-smoker but has been exposed to tobacco smoke. She has never used smokeless tobacco. She reports that she does not drink alcohol or use drugs.  Additional Social History:     CIWA: CIWA-Ar BP: (!) 164/82 Pulse Rate: 83 COWS:    Allergies:  Allergies  Allergen Reactions  . Benicar [Olmesartan] Swelling  . Codeine Other (See Comments)    Confusion   . Sulfa Antibiotics Swelling    Whole face swells  . Trulicity [Dulaglutide] Diarrhea    Home Medications: (Not in a hospital admission)   OB/GYN Status:  Patient's last menstrual period was 08/06/2012.  General Assessment Data Location of Assessment: [facility] ED TTS Assessment: In system Is this a Tele or Face-to-Face Assessment?: Tele Assessment Is this an Initial Assessment or a Re-assessment for this encounter?: Initial Assessment Patient Accompanied by:: N/A Language Other than English: No Living Arrangements: Other (Comment)(her home) What gender do you identify as?:  Female Marital status: Married Pregnancy Status: No Living Arrangements: Spouse/significant other Can pt return to current living arrangement?: Yes Admission Status: Voluntary Is patient capable of signing voluntary admission?: Yes Referral Source: Self/Family/Friend Insurance type: [place] Living Arrangements: Spouse/significant other Legal Guardian: (self) Name of Psychiatrist: none Name of Therapist: none  Education Status Is patient currently in school?: No Is the patient employed, unemployed or receiving disability?: Receiving disability income  Risk to self with the past 6 months Suicidal Ideation: No-Not Currently/Within Last 6 Months Has patient been a risk to self within the past 6 months prior to admission? : No Suicidal Intent: No Has patient had any suicidal intent within the past 6 months prior to admission? : No Is patient at risk for suicide?: No Suicidal Plan?: No Has patient had any suicidal plan within the past 6 months prior to admission? : No Access to Means: No What has been your use of drugs/alcohol within the last 12 months?: denies Previous Attempts/Gestures: No How many times?: 0 Other Self Harm Risks: none Triggers for Past Attempts: None known Intentional Self Injurious Behavior: None Family Suicide History: No Recent stressful life event(s): Recent negative physical changes Persecutory voices/beliefs?: No Depression: No Depression Symptoms: Feeling angry/irritable, Loss of interest in usual pleasures, Fatigue, Isolating Substance abuse history and/or treatment for substance abuse?: No Suicide prevention information given to non-admitted patients: Not applicable  Risk to Others within the past 6 months Homicidal Ideation: No Does patient have any lifetime risk of violence toward others beyond the six months prior to admission? : No Thoughts of Harm to Others: No Current Homicidal Intent: No Current Homicidal Plan:  No Access to Homicidal Means: No Identified Victim: none History of harm to others?: No Assessment of Violence: None Noted Violent Behavior Description: none Does patient have access to weapons?: No Criminal Charges Pending?: No Does patient have a court date: No Is patient on probation?: No  Psychosis Hallucinations: Visual, Auditory Delusions: None noted  Mental Status Report Appearance/Hygiene: In hospital gown Eye Contact: Good Motor Activity: Unremarkable Speech: Logical/coherent Level of Consciousness: Alert Mood: Pleasant Affect: Appropriate to circumstance Anxiety Level: None Thought Processes: Coherent, Relevant Judgement: Partial Orientation: Place, Person, Time, Situation Obsessive Compulsive Thoughts/Behaviors: None  Cognitive Functioning Concentration: Normal Memory: Recent Intact, Remote Intact Is patient IDD: No Insight: Fair Impulse Control: Fair Appetite: Good Have you had any weight changes? : No Change Sleep: No Change Total Hours of Sleep: 8 Vegetative Symptoms: Staying in bed  ADLScreening [facility] Assessment Services) Patient's cognitive ability adequate to safely complete daily activities?: Yes Patient able to express need for assistance with ADLs?: Yes Independently performs ADLs?: Yes (appropriate for developmental age)  Prior Inpatient Therapy Prior Inpatient Therapy: Yes Prior Therapy Dates: 2020 Prior Therapy Facilty/Provider(s): [place] Reason for Treatment: depression, AVH  Prior Outpatient Therapy Prior Outpatient Therapy: No Does patient have an ACCT team?: No Does patient have Intensive In-House Services?  : No Does patient have [facility] services? : No Does patient have P4CC services?: No  ADL Screening (condition at time of admission) Patient's cognitive ability adequate to safely complete daily activities?:  Yes Is the patient deaf or have difficulty hearing?: No Does the patient have difficulty seeing, even when wearing  glasses/contacts?: No Does the patient have difficulty concentrating, remembering, or making decisions?: No Patient able to express need for assistance with ADLs?: Yes Does the patient have difficulty dressing or bathing?: No Independently performs ADLs?: Yes (appropriate for developmental age) Does the patient have difficulty walking or climbing stairs?: No Weakness of Legs: None Weakness of Arms/Hands: None  Home Assistive Devices/Equipment Home Assistive Devices/Equipment: None  Therapy Consults (therapy consults require a physician order) PT Evaluation Needed: No OT Evalulation Needed: No SLP Evaluation Needed: No Abuse/Neglect Assessment (Assessment to be complete while patient is alone) Abuse/Neglect Assessment Can Be Completed: Yes Physical Abuse: Denies Verbal Abuse: Denies Sexual Abuse: Denies Exploitation of patient/patient's resources: Denies Self-Neglect: Denies Values / Beliefs Cultural Requests During Hospitalization: None Spiritual Requests During Hospitalization: None Consults Spiritual Care Consult Needed: No Social Work Consult Needed: No Regulatory affairs officer (For Healthcare) Does Patient Have a Medical Advance Directive?: No Would patient like information on creating a medical advance directive?: Yes (ED - Information included in AVS)          Disposition: Shuvon Rankin, NP recommends patient be observed overnight for safety and stabilization. Psychiatry will reassess when medically stable. Disposition Initial Assessment Completed for this Encounter: Yes  This service was provided via telemedicine using a 2-way, interactive audio and video technology.  Names of all persons participating in this telemedicine service and their role in this encounter. Name: [place] Theobald Role: patient  Name: [name] Mccall Role: collateral  Name: Orvis Brill, LCSW Role: TTS  Name:  Role:     Orvis Brill 09/25/2019 6:15 PM

## 2019-09-26 ENCOUNTER — Inpatient Hospital Stay (HOSPITAL_COMMUNITY)
Admission: EM | Admit: 2019-09-26 | Discharge: 2019-10-05 | DRG: 638 | Disposition: A | Discharge status: Home / Self Care | Payer: BC Managed Care – PPO | Attending: Family Medicine | Admitting: Family Medicine

## 2019-09-26 ENCOUNTER — Encounter (HOSPITAL_COMMUNITY): Payer: Self-pay | Admitting: Emergency Medicine

## 2019-09-26 ENCOUNTER — Other Ambulatory Visit: Payer: Self-pay

## 2019-09-26 DIAGNOSIS — Z882 Allergy status to sulfonamides status: Secondary | ICD-10-CM

## 2019-09-26 DIAGNOSIS — Z794 Long term (current) use of insulin: Secondary | ICD-10-CM

## 2019-09-26 DIAGNOSIS — E1110 Type 2 diabetes mellitus with ketoacidosis without coma: Principal | ICD-10-CM | POA: Diagnosis present

## 2019-09-26 DIAGNOSIS — N189 Chronic kidney disease, unspecified: Secondary | ICD-10-CM | POA: Diagnosis present

## 2019-09-26 DIAGNOSIS — I5189 Other ill-defined heart diseases: Secondary | ICD-10-CM

## 2019-09-26 DIAGNOSIS — R410 Disorientation, unspecified: Secondary | ICD-10-CM

## 2019-09-26 DIAGNOSIS — F0390 Unspecified dementia without behavioral disturbance: Secondary | ICD-10-CM | POA: Diagnosis present

## 2019-09-26 DIAGNOSIS — Y929 Unspecified place or not applicable: Secondary | ICD-10-CM

## 2019-09-26 DIAGNOSIS — T383X6A Underdosing of insulin and oral hypoglycemic [antidiabetic] drugs, initial encounter: Secondary | ICD-10-CM | POA: Diagnosis present

## 2019-09-26 DIAGNOSIS — E11649 Type 2 diabetes mellitus with hypoglycemia without coma: Secondary | ICD-10-CM | POA: Diagnosis not present

## 2019-09-26 DIAGNOSIS — N179 Acute kidney failure, unspecified: Secondary | ICD-10-CM | POA: Diagnosis present

## 2019-09-26 DIAGNOSIS — Z6841 Body Mass Index (BMI) 40.0 and over, adult: Secondary | ICD-10-CM

## 2019-09-26 DIAGNOSIS — Z885 Allergy status to narcotic agent status: Secondary | ICD-10-CM

## 2019-09-26 DIAGNOSIS — J449 Chronic obstructive pulmonary disease, unspecified: Secondary | ICD-10-CM | POA: Diagnosis present

## 2019-09-26 DIAGNOSIS — R531 Weakness: Secondary | ICD-10-CM | POA: Diagnosis present

## 2019-09-26 DIAGNOSIS — Z823 Family history of stroke: Secondary | ICD-10-CM

## 2019-09-26 DIAGNOSIS — Z9049 Acquired absence of other specified parts of digestive tract: Secondary | ICD-10-CM

## 2019-09-26 DIAGNOSIS — Z79899 Other long term (current) drug therapy: Secondary | ICD-10-CM

## 2019-09-26 DIAGNOSIS — Z8249 Family history of ischemic heart disease and other diseases of the circulatory system: Secondary | ICD-10-CM

## 2019-09-26 DIAGNOSIS — R9431 Abnormal electrocardiogram [ECG] [EKG]: Secondary | ICD-10-CM | POA: Diagnosis present

## 2019-09-26 DIAGNOSIS — Z833 Family history of diabetes mellitus: Secondary | ICD-10-CM

## 2019-09-26 DIAGNOSIS — Z20828 Contact with and (suspected) exposure to other viral communicable diseases: Secondary | ICD-10-CM | POA: Diagnosis present

## 2019-09-26 DIAGNOSIS — Z888 Allergy status to other drugs, medicaments and biological substances status: Secondary | ICD-10-CM

## 2019-09-26 DIAGNOSIS — F329 Major depressive disorder, single episode, unspecified: Secondary | ICD-10-CM | POA: Diagnosis present

## 2019-09-26 DIAGNOSIS — F29 Unspecified psychosis not due to a substance or known physiological condition: Secondary | ICD-10-CM | POA: Diagnosis present

## 2019-09-26 DIAGNOSIS — I10 Essential (primary) hypertension: Secondary | ICD-10-CM | POA: Diagnosis present

## 2019-09-26 LAB — CBG MONITORING, ED: Glucose-Capillary: 254 mg/dL — ABNORMAL HIGH (ref 70–99)

## 2019-09-26 MED ORDER — SERTRALINE HCL 25 MG PO TABS: 25.0000 mg | ORAL_TABLET | Freq: Every day | ORAL | 0 refills | Status: DC

## 2019-09-26 MED ORDER — DICYCLOMINE HCL 10 MG PO CAPS
20.0000 mg | ORAL_CAPSULE | Freq: Three times a day (TID) | ORAL | Status: DC
  Administered 2019-09-26: 20 mg via ORAL
  Filled 2019-09-26: qty 2

## 2019-09-26 MED ORDER — SERTRALINE HCL 50 MG PO TABS
25.0000 mg | ORAL_TABLET | Freq: Every day | ORAL | Status: DC
  Administered 2019-09-26: 25 mg via ORAL
  Filled 2019-09-26: qty 1

## 2019-09-26 NOTE — Progress Notes (Signed)
Patient ID: [name], female   DOB: Jun 24, 1961, 58 y.o.   MRN: [id number]   Reassessment   Patient re-assessed by this provider following request for psychiatric consult. Patient initally presented to [facility] ED for shortness of breath. At some point during her admission, collateral was obtained from patients husband and per chart review, husband reported that patient had been showing signs of depression, was "talking out of her head", had made comments that she wanted to kill herself, made comments that she was hearing voices and seeing people that are not there, and has a passed psychiatric admission to [facility].   During this evaluation, patient is alert and oriented x4, calm and cooperative. She reports she was taken to the hospital following shortness of breath. At this time, she does note some improvement. In regard to any psychiatric issues, she denies SI or HI. She denies visual hallucinations at this time and reports the last time she experienced  any visual hallucinations was prior to her admission to [facility] which she reports was in March, 2020.  She denies auditory hallucinations or other psychosis. She denies feeling depressed although I spoke with patients husband, [name] [phone] with verbal consent and he did endorses concerns as noted above about patients mood appearing depressed. He reports that patient has been on Lexapro for 10 years which he does not feel is effective. Her reports patients medications are prescribed by her PCP and that she does not have an outpatient psychiatrist or therapist. We discussed discontinuing  Lexapro (states that he is unsure if she had been taking it or not) and discussed starting a small dose of Zoloft and he has agreed.   Based on this evaluation, patient does not meet inpatient psychiatric hospitalization and she is psychiatrically cleared. I have ordered Zoloft 25 mg po daily for depression. I will ask LCSW to fax over resources  for outpatient therapy and psychiatry.   Dr. Erie Noe, EDP updated on current disposition.

## 2019-09-26 NOTE — ED Provider Notes (Signed)
Pt was signed out from previous shift pending am psych eval.  This morning, pt is awake and alert and not suicidal or homicidal.  She is not having active hallucinations.  She is not sob.  TTS has cleared pt for outpatient tx.  They recommended a low dose zoloft to add to pt's meds.  Pt is glad to go home.  She knows to return if worse.   Isla Pence, MD 09/26/19 1000

## 2019-09-26 NOTE — ED Notes (Signed)
Spoke with pt's spouse and he said he can't get here until 2pm to pick her up.

## 2019-09-26 NOTE — ED Triage Notes (Signed)
Pt states she does not know why she is here. Husband states she is confused and having hallucinations and stating she does not want to live anymore. Pt denies any si/hi ideations.

## 2019-09-26 NOTE — ED Notes (Signed)
TTS in progress 

## 2019-09-26 NOTE — ED Notes (Signed)
Pt was given breakfast tray 

## 2019-09-26 NOTE — ED Notes (Signed)
Spouse was notified that pt is discharged by charge RN.  Spouse states that he will pick her up at 2pm. Notified pharmacy for missing 8am Insulin so she can be medicated as prescribed.

## 2019-09-26 NOTE — ED Notes (Signed)
Pt was given lunch tray.  

## 2019-09-27 DIAGNOSIS — N179 Acute kidney failure, unspecified: Secondary | ICD-10-CM | POA: Diagnosis present

## 2019-09-27 DIAGNOSIS — Z882 Allergy status to sulfonamides status: Secondary | ICD-10-CM | POA: Diagnosis not present

## 2019-09-27 DIAGNOSIS — I10 Essential (primary) hypertension: Secondary | ICD-10-CM | POA: Diagnosis present

## 2019-09-27 DIAGNOSIS — Z823 Family history of stroke: Secondary | ICD-10-CM | POA: Diagnosis not present

## 2019-09-27 DIAGNOSIS — E1110 Type 2 diabetes mellitus with ketoacidosis without coma: Secondary | ICD-10-CM | POA: Diagnosis present

## 2019-09-27 DIAGNOSIS — T383X6A Underdosing of insulin and oral hypoglycemic [antidiabetic] drugs, initial encounter: Secondary | ICD-10-CM | POA: Diagnosis present

## 2019-09-27 DIAGNOSIS — Z79899 Other long term (current) drug therapy: Secondary | ICD-10-CM | POA: Diagnosis not present

## 2019-09-27 DIAGNOSIS — Y929 Unspecified place or not applicable: Secondary | ICD-10-CM | POA: Diagnosis not present

## 2019-09-27 DIAGNOSIS — Z20828 Contact with and (suspected) exposure to other viral communicable diseases: Secondary | ICD-10-CM | POA: Diagnosis present

## 2019-09-27 DIAGNOSIS — Z6841 Body Mass Index (BMI) 40.0 and over, adult: Secondary | ICD-10-CM | POA: Diagnosis not present

## 2019-09-27 DIAGNOSIS — R9431 Abnormal electrocardiogram [ECG] [EKG]: Secondary | ICD-10-CM | POA: Diagnosis present

## 2019-09-27 DIAGNOSIS — E11649 Type 2 diabetes mellitus with hypoglycemia without coma: Secondary | ICD-10-CM | POA: Diagnosis not present

## 2019-09-27 DIAGNOSIS — Z833 Family history of diabetes mellitus: Secondary | ICD-10-CM | POA: Diagnosis not present

## 2019-09-27 DIAGNOSIS — Z885 Allergy status to narcotic agent status: Secondary | ICD-10-CM | POA: Diagnosis not present

## 2019-09-27 DIAGNOSIS — R410 Disorientation, unspecified: Secondary | ICD-10-CM | POA: Diagnosis present

## 2019-09-27 DIAGNOSIS — Z8249 Family history of ischemic heart disease and other diseases of the circulatory system: Secondary | ICD-10-CM | POA: Diagnosis not present

## 2019-09-27 DIAGNOSIS — Z9049 Acquired absence of other specified parts of digestive tract: Secondary | ICD-10-CM | POA: Diagnosis not present

## 2019-09-27 DIAGNOSIS — F29 Unspecified psychosis not due to a substance or known physiological condition: Secondary | ICD-10-CM | POA: Diagnosis present

## 2019-09-27 DIAGNOSIS — Z888 Allergy status to other drugs, medicaments and biological substances status: Secondary | ICD-10-CM | POA: Diagnosis not present

## 2019-09-27 DIAGNOSIS — J449 Chronic obstructive pulmonary disease, unspecified: Secondary | ICD-10-CM | POA: Diagnosis present

## 2019-09-27 DIAGNOSIS — Z794 Long term (current) use of insulin: Secondary | ICD-10-CM | POA: Diagnosis not present

## 2019-09-27 DIAGNOSIS — R531 Weakness: Secondary | ICD-10-CM | POA: Diagnosis present

## 2019-09-27 DIAGNOSIS — F0390 Unspecified dementia without behavioral disturbance: Secondary | ICD-10-CM | POA: Diagnosis present

## 2019-09-27 DIAGNOSIS — F329 Major depressive disorder, single episode, unspecified: Secondary | ICD-10-CM | POA: Diagnosis present

## 2019-09-27 LAB — COMPREHENSIVE METABOLIC PANEL
ALT: 22 U/L (ref 0–44)
AST: 30 U/L (ref 15–41)
Albumin: 3.7 g/dL (ref 3.5–5.0)
Alkaline Phosphatase: 108 U/L (ref 38–126)
Anion gap: 24 — ABNORMAL HIGH (ref 5–15)
BUN: 17 mg/dL (ref 6–20)
CO2: 19 mmol/L — ABNORMAL LOW (ref 22–32)
Calcium: 9.3 mg/dL (ref 8.9–10.3)
Chloride: 93 mmol/L — ABNORMAL LOW (ref 98–111)
Creatinine, Ser: 1.33 mg/dL — ABNORMAL HIGH (ref 0.44–1.00)
GFR calc Af Amer: 51 mL/min — ABNORMAL LOW (ref >60)
GFR calc non Af Amer: 44 mL/min — ABNORMAL LOW (ref >60)
Glucose, Bld: 741 mg/dL (ref 70–99)
Potassium: 4.2 mmol/L (ref 3.5–5.1)
Sodium: 136 mmol/L (ref 135–145)
Total Bilirubin: 2.1 mg/dL — ABNORMAL HIGH (ref 0.3–1.2)
Total Protein: 7.1 g/dL (ref 6.5–8.1)

## 2019-09-27 LAB — CBC WITH DIFFERENTIAL/PLATELET
Abs Immature Granulocytes: 0.03 10*3/uL (ref 0.00–0.07)
Basophils Absolute: 0.1 10*3/uL (ref 0.0–0.1)
Basophils Relative: 1 %
Eosinophils Absolute: 0 10*3/uL (ref 0.0–0.5)
Eosinophils Relative: 0 %
HCT: 51.3 % — ABNORMAL HIGH (ref 36.0–46.0)
Hemoglobin: 16.4 g/dL — ABNORMAL HIGH (ref 12.0–15.0)
Immature Granulocytes: 0 %
Lymphocytes Relative: 9 %
Lymphs Abs: 0.8 10*3/uL (ref 0.7–4.0)
MCH: 28.8 pg (ref 26.0–34.0)
MCHC: 32 g/dL (ref 30.0–36.0)
MCV: 90.2 fL (ref 80.0–100.0)
Monocytes Absolute: 0.3 10*3/uL (ref 0.1–1.0)
Monocytes Relative: 4 %
Neutro Abs: 7.5 10*3/uL (ref 1.7–7.7)
Neutrophils Relative %: 86 %
Platelets: 268 10*3/uL (ref 150–400)
RBC: 5.69 MIL/uL — ABNORMAL HIGH (ref 3.87–5.11)
RDW: 13.3 % (ref 11.5–15.5)
WBC: 8.7 10*3/uL (ref 4.0–10.5)
nRBC: 0 % (ref 0.0–0.2)

## 2019-09-27 LAB — URINALYSIS, ROUTINE W REFLEX MICROSCOPIC
Bacteria, UA: NONE SEEN
Bilirubin Urine: NEGATIVE
Glucose, UA: >=500 mg/dL — AB
Ketones, ur: 80 mg/dL — AB
Leukocytes,Ua: NEGATIVE
Nitrite: NEGATIVE
Protein, ur: NEGATIVE mg/dL
Specific Gravity, Urine: 1.016 (ref 1.005–1.030)
pH: 5 (ref 5.0–8.0)

## 2019-09-27 LAB — BASIC METABOLIC PANEL
Anion gap: 15 (ref 5–15)
Anion gap: 11 (ref 5–15)
Anion gap: 10 (ref 5–15)
BUN: 21 mg/dL — ABNORMAL HIGH (ref 6–20)
BUN: 17 mg/dL (ref 6–20)
BUN: 14 mg/dL (ref 6–20)
BUN: 13 mg/dL (ref 6–20)
CO2: 16 mmol/L — ABNORMAL LOW (ref 22–32)
CO2: 25 mmol/L (ref 22–32)
CO2: 28 mmol/L (ref 22–32)
CO2: 28 mmol/L (ref 22–32)
Calcium: 8.9 mg/dL (ref 8.9–10.3)
Calcium: 9.3 mg/dL (ref 8.9–10.3)
Calcium: 9.1 mg/dL (ref 8.9–10.3)
Calcium: 9.1 mg/dL (ref 8.9–10.3)
Chloride: 99 mmol/L (ref 98–111)
Chloride: 101 mmol/L (ref 98–111)
Chloride: 103 mmol/L (ref 98–111)
Chloride: 104 mmol/L (ref 98–111)
Creatinine, Ser: 1.41 mg/dL — ABNORMAL HIGH (ref 0.44–1.00)
Creatinine, Ser: 1.17 mg/dL — ABNORMAL HIGH (ref 0.44–1.00)
Creatinine, Ser: 0.93 mg/dL (ref 0.44–1.00)
Creatinine, Ser: 0.89 mg/dL (ref 0.44–1.00)
GFR calc Af Amer: 47 mL/min — ABNORMAL LOW (ref >60)
GFR calc Af Amer: 59 mL/min — ABNORMAL LOW (ref >60)
GFR calc Af Amer: >60 mL/min (ref >60)
GFR calc Af Amer: >60 mL/min (ref >60)
GFR calc non Af Amer: 41 mL/min — ABNORMAL LOW (ref >60)
GFR calc non Af Amer: 51 mL/min — ABNORMAL LOW (ref >60)
GFR calc non Af Amer: >60 mL/min (ref >60)
GFR calc non Af Amer: >60 mL/min (ref >60)
Glucose, Bld: 666 mg/dL (ref 70–99)
Glucose, Bld: 350 mg/dL — ABNORMAL HIGH (ref 70–99)
Glucose, Bld: 208 mg/dL — ABNORMAL HIGH (ref 70–99)
Glucose, Bld: 139 mg/dL — ABNORMAL HIGH (ref 70–99)
Potassium: 3.8 mmol/L (ref 3.5–5.1)
Potassium: 3.3 mmol/L — ABNORMAL LOW (ref 3.5–5.1)
Potassium: 2.9 mmol/L — ABNORMAL LOW (ref 3.5–5.1)
Potassium: 3.2 mmol/L — ABNORMAL LOW (ref 3.5–5.1)
Sodium: 139 mmol/L (ref 135–145)
Sodium: 141 mmol/L (ref 135–145)
Sodium: 142 mmol/L (ref 135–145)
Sodium: 142 mmol/L (ref 135–145)

## 2019-09-27 LAB — CBG MONITORING, ED
Glucose-Capillary: >600 mg/dL (ref 70–99)
Glucose-Capillary: >600 mg/dL (ref 70–99)
Glucose-Capillary: 563 mg/dL (ref 70–99)
Glucose-Capillary: 478 mg/dL — ABNORMAL HIGH (ref 70–99)
Glucose-Capillary: 393 mg/dL — ABNORMAL HIGH (ref 70–99)
Glucose-Capillary: 326 mg/dL — ABNORMAL HIGH (ref 70–99)
Glucose-Capillary: 298 mg/dL — ABNORMAL HIGH (ref 70–99)
Glucose-Capillary: 246 mg/dL — ABNORMAL HIGH (ref 70–99)
Glucose-Capillary: 223 mg/dL — ABNORMAL HIGH (ref 70–99)
Glucose-Capillary: 235 mg/dL — ABNORMAL HIGH (ref 70–99)
Glucose-Capillary: 198 mg/dL — ABNORMAL HIGH (ref 70–99)
Glucose-Capillary: 154 mg/dL — ABNORMAL HIGH (ref 70–99)
Glucose-Capillary: 135 mg/dL — ABNORMAL HIGH (ref 70–99)

## 2019-09-27 LAB — GLUCOSE, CAPILLARY: Glucose-Capillary: 83 mg/dL (ref 70–99)

## 2019-09-27 LAB — RAPID URINE DRUG SCREEN, HOSP PERFORMED
Amphetamines: NOT DETECTED
Barbiturates: NOT DETECTED
Benzodiazepines: NOT DETECTED
Cocaine: NOT DETECTED
Opiates: NOT DETECTED
Tetrahydrocannabinol: NOT DETECTED

## 2019-09-27 LAB — MAGNESIUM: Magnesium: 2.1 mg/dL (ref 1.7–2.4)

## 2019-09-27 LAB — BRAIN NATRIURETIC PEPTIDE: B Natriuretic Peptide: 42 pg/mL (ref 0.0–100.0)

## 2019-09-27 LAB — BLOOD GAS, VENOUS
Acid-base deficit: 4.2 mmol/L — ABNORMAL HIGH (ref 0.0–2.0)
Bicarbonate: 19.6 mmol/L — ABNORMAL LOW (ref 20.0–28.0)
Drawn by: 4252
O2 Saturation: 64.8 %
Patient temperature: 37
pCO2, Ven: 45.4 mmHg (ref 44.0–60.0)
pH, Ven: 7.293 (ref 7.250–7.430)
pO2, Ven: 39 mmHg (ref 32.0–45.0)

## 2019-09-27 LAB — PHOSPHORUS: Phosphorus: 5.8 mg/dL — ABNORMAL HIGH (ref 2.5–4.6)

## 2019-09-27 LAB — LACTIC ACID, PLASMA
Lactic Acid, Venous: 2.2 mmol/L (ref 0.5–1.9)
Lactic Acid, Venous: 2.5 mmol/L (ref 0.5–1.9)

## 2019-09-27 LAB — HIV ANTIBODY (ROUTINE TESTING W REFLEX): HIV Screen 4th Generation wRfx: NONREACTIVE

## 2019-09-27 LAB — ETHANOL: Alcohol, Ethyl (B): <10 mg/dL (ref <10)

## 2019-09-27 MED ORDER — POTASSIUM CHLORIDE IN NACL 20-0.45 MEQ/L-% IV SOLN
INTRAVENOUS | Status: DC
  Filled 2019-09-27: qty 1000

## 2019-09-27 MED ORDER — LORAZEPAM 2 MG/ML IJ SOLN
2.0000 mg | Freq: Once | INTRAMUSCULAR | Status: AC
  Administered 2019-09-27: 2 mg via INTRAVENOUS
  Filled 2019-09-27: qty 1

## 2019-09-27 MED ORDER — SODIUM CHLORIDE 0.9 % IV SOLN: INTRAVENOUS | Status: DC

## 2019-09-27 MED ORDER — ALBUTEROL SULFATE (2.5 MG/3ML) 0.083% IN NEBU: 3.0000 mL | INHALATION_SOLUTION | Freq: Four times a day (QID) | RESPIRATORY_TRACT | Status: DC | PRN

## 2019-09-27 MED ORDER — INSULIN REGULAR(HUMAN) IN NACL 100-0.9 UT/100ML-% IV SOLN: INTRAVENOUS | Status: DC

## 2019-09-27 MED ORDER — POTASSIUM CHLORIDE CRYS ER 20 MEQ PO TBCR
40.0000 meq | EXTENDED_RELEASE_TABLET | ORAL | Status: AC
  Administered 2019-09-27 (×2): 40 meq via ORAL
  Filled 2019-09-27 (×2): qty 2

## 2019-09-27 MED ORDER — SODIUM CHLORIDE 0.45 % IV BOLUS
1000.0000 mL | Freq: Once | INTRAVENOUS | Status: AC
  Administered 2019-09-27: 1000 mL via INTRAVENOUS

## 2019-09-27 MED ORDER — POTASSIUM CHLORIDE 10 MEQ/100ML IV SOLN
10.0000 meq | INTRAVENOUS | Status: DC
  Filled 2019-09-27: qty 100

## 2019-09-27 MED ORDER — SODIUM CHLORIDE 0.9 % IV BOLUS
1000.0000 mL | Freq: Once | INTRAVENOUS | Status: AC
  Administered 2019-09-27: 1000 mL via INTRAVENOUS

## 2019-09-27 MED ORDER — DEXTROSE-NACL 5-0.45 % IV SOLN
INTRAVENOUS | Status: DC
  Administered 2019-09-27: 13:00:00 via INTRAVENOUS

## 2019-09-27 MED ORDER — INSULIN ASPART PROT & ASPART (70-30 MIX) 100 UNIT/ML ~~LOC~~ SUSP
80.0000 [IU] | Freq: Two times a day (BID) | SUBCUTANEOUS | Status: DC
  Administered 2019-09-27 – 2019-09-29 (×3): 80 [IU] via SUBCUTANEOUS
  Filled 2019-09-27 (×3): qty 10

## 2019-09-27 MED ORDER — DEXTROSE-NACL 5-0.45 % IV SOLN: INTRAVENOUS | Status: DC

## 2019-09-27 MED ORDER — INSULIN REGULAR(HUMAN) IN NACL 100-0.9 UT/100ML-% IV SOLN
INTRAVENOUS | Status: DC
  Administered 2019-09-27: 5.4 [IU]/h via INTRAVENOUS
  Filled 2019-09-27 (×2): qty 100

## 2019-09-27 MED ORDER — ENOXAPARIN SODIUM 80 MG/0.8ML ~~LOC~~ SOLN
70.0000 mg | SUBCUTANEOUS | Status: DC
  Administered 2019-09-28 – 2019-10-05 (×8): 70 mg via SUBCUTANEOUS
  Filled 2019-09-27 (×8): qty 0.8

## 2019-09-27 MED ORDER — SODIUM CHLORIDE 0.9 % IV SOLN
INTRAVENOUS | Status: DC
  Administered 2019-09-27 – 2019-09-30 (×5): via INTRAVENOUS

## 2019-09-27 MED ORDER — ENOXAPARIN SODIUM 40 MG/0.4ML ~~LOC~~ SOLN: 40.0000 mg | SUBCUTANEOUS | Status: DC

## 2019-09-27 MED ORDER — POTASSIUM CHLORIDE IN NACL 20-0.45 MEQ/L-% IV SOLN
INTRAVENOUS | Status: AC
  Administered 2019-09-27: 03:00:00 via INTRAVENOUS
  Filled 2019-09-27 (×2): qty 1000

## 2019-09-27 MED ORDER — INSULIN ASPART 100 UNIT/ML ~~LOC~~ SOLN
0.0000 [IU] | Freq: Three times a day (TID) | SUBCUTANEOUS | Status: DC
  Administered 2019-09-29: 7 [IU] via SUBCUTANEOUS
  Administered 2019-09-29: 15 [IU] via SUBCUTANEOUS
  Administered 2019-09-29: 11 [IU] via SUBCUTANEOUS

## 2019-09-27 MED ORDER — POTASSIUM CHLORIDE 10 MEQ/100ML IV SOLN: 10.0000 meq | INTRAVENOUS | Status: DC

## 2019-09-27 MED ORDER — FAMOTIDINE 20 MG PO TABS
20.0000 mg | ORAL_TABLET | Freq: Every day | ORAL | Status: DC
  Administered 2019-09-27 – 2019-10-04 (×8): 20 mg via ORAL
  Filled 2019-09-27 (×8): qty 1

## 2019-09-27 MED ORDER — INSULIN ASPART 100 UNIT/ML ~~LOC~~ SOLN
0.0000 [IU] | Freq: Every day | SUBCUTANEOUS | Status: DC
  Administered 2019-09-28: 5 [IU] via SUBCUTANEOUS

## 2019-09-27 MED ORDER — MAGNESIUM SULFATE 2 GM/50ML IV SOLN
2.0000 g | Freq: Once | INTRAVENOUS | Status: AC
  Administered 2019-09-27: 2 g via INTRAVENOUS
  Filled 2019-09-27: qty 50

## 2019-09-27 MED ORDER — LORAZEPAM 2 MG/ML IJ SOLN
1.0000 mg | Freq: Four times a day (QID) | INTRAMUSCULAR | Status: DC | PRN
  Administered 2019-09-27 – 2019-10-01 (×2): 1 mg via INTRAVENOUS
  Filled 2019-09-27 (×4): qty 1

## 2019-09-27 MED ORDER — INSULIN ASPART 100 UNIT/ML IV SOLN
10.0000 [IU] | Freq: Once | INTRAVENOUS | Status: AC
  Administered 2019-09-27: 10 [IU] via INTRAVENOUS

## 2019-09-27 NOTE — ED Notes (Signed)
CRITICAL VALUE ALERT  Critical Value: Glucose 741  Date & Time Notied:  09/27/2019 Provider Notified:Dr I Tomi Bamberger Orders Received/Actions taken: None yet

## 2019-09-27 NOTE — ED Notes (Signed)
ED TO INPATIENT HANDOFF REPORT  ED Nurse Name and Phone #: Verline Lema [place] Name/Age/Gender [name] 58 y.o. female Room/Bed: APA08/APA08  Code Status   Code Status: Full Code  Home/SNF/Other Home Patient oriented to: self Is this baseline? Yes   Triage Complete: Triage complete  Chief Complaint Hallucinations  Triage Note Pt states she does not know why she is here. Husband states she is confused and having hallucinations and stating she does not want to live anymore. Pt denies any si/hi ideations.   Allergies Allergies  Allergen Reactions  . Benicar [Olmesartan] Swelling  . Codeine Other (See Comments)    Confusion   . Sulfa Antibiotics Swelling    Whole face swells  . Trulicity [Dulaglutide] Diarrhea    Level of Care/Admitting Diagnosis ED Disposition    ED Disposition Condition [facility] Area: [facility] [id number]  Level of Care: Med-Surg [16]  Covid Evaluation: N/A  Diagnosis: DKA (diabetic ketoacidosis) ([place]) [id number]  Admitting Physician: Morrison Old  Attending Physician: Morrison Old  Estimated length of stay: 3 - 4 days  Certification:: I certify this patient will need inpatient services for at least 2 midnights  Bed request comments: med  PT Class (Do Not Modify): Inpatient [101]  PT Acc Code (Do Not Modify): Private [1]       B Medical/Surgery History Past Medical History:  Diagnosis Date  . Asthma   . COPD (chronic obstructive pulmonary disease) ([place])   . Depression   . Diabetes mellitus   . Diastolic dysfunction [id number]  . Hypertension   . Prolonged QT interval 05/14/2015   Past Surgical History:  Procedure Laterality Date  . CATARACT EXTRACTION    . CESAREAN SECTION    . CHOLECYSTECTOMY       A IV Location/Drains/Wounds Patient Lines/Drains/Airways Status   Active Line/Drains/Airways    Name:   Placement date:   Placement time:   Site:   Days:   Peripheral  IV 09/27/19 Left Wrist   09/27/19    0245    Wrist   less than 1          Intake/Output Last 24 hours  Intake/Output Summary (Last 24 hours) at 09/27/2019 1923 Last data filed at 09/27/2019 0453 Gross per 24 hour  Intake 1884.83 ml  Output -  Net 1884.83 ml    Labs/Imaging Results for orders placed or performed during the hospital encounter of 09/26/19 (from the past 48 hour(s))  Comprehensive metabolic panel     Status: Abnormal   Collection Time: 09/26/19 11:49 PM  Result Value Ref Range   Sodium 136 135 - 145 mmol/L   Potassium 4.2 3.5 - 5.1 mmol/L    Comment: DELTA CHECK NOTED   Chloride 93 (L) 98 - 111 mmol/L   CO2 19 (L) 22 - 32 mmol/L   Glucose, Bld 741 (HH) 70 - 99 mg/dL    Comment: CRITICAL RESULT CALLED TO, READ BACK BY AND VERIFIED WITH: G PRUITT,RN @0042  09/27/19 MKELLY    BUN 17 6 - 20 mg/dL   Creatinine, Ser 1.33 (H) 0.44 - 1.00 mg/dL   Calcium 9.3 8.9 - 10.3 mg/dL   Total Protein 7.1 6.5 - 8.1 g/dL   Albumin 3.7 3.5 - 5.0 g/dL   AST 30 15 - 41 U/L   ALT 22 0 - 44 U/L   Alkaline Phosphatase 108 38 - 126 U/L   Total Bilirubin 2.1 (H) 0.3 - 1.2 mg/dL  GFR calc non Af Amer 44 (L) >60 mL/min   GFR calc Af Amer 51 (L) >60 mL/min   Anion gap 24 (H) 5 - 15    Comment: Performed at [facility], [address], [place], [place] [address]  CBC with Differential     Status: Abnormal   Collection Time: 09/26/19 11:49 PM  Result Value Ref Range   WBC 8.7 4.0 - 10.5 K/uL   RBC 5.69 (H) 3.87 - 5.11 MIL/uL   Hemoglobin 16.4 (H) 12.0 - 15.0 g/dL   HCT 51.3 (H) 36.0 - 46.0 %   MCV 90.2 80.0 - 100.0 fL   MCH 28.8 26.0 - 34.0 pg   MCHC 32.0 30.0 - 36.0 g/dL   RDW 13.3 11.5 - 15.5 %   Platelets 268 150 - 400 K/uL   nRBC 0.0 0.0 - 0.2 %   Neutrophils Relative % 86 %   Neutro Abs 7.5 1.7 - 7.7 K/uL   Lymphocytes Relative 9 %   Lymphs Abs 0.8 0.7 - 4.0 K/uL   Monocytes Relative 4 %   Monocytes Absolute 0.3 0.1 - 1.0 K/uL   Eosinophils Relative 0 %   Eosinophils  Absolute 0.0 0.0 - 0.5 K/uL   Basophils Relative 1 %   Basophils Absolute 0.1 0.0 - 0.1 K/uL   Immature Granulocytes 0 %   Abs Immature Granulocytes 0.03 0.00 - 0.07 K/uL    Comment: Performed at [facility], [address], [place], [place] [address]  Ethanol     Status: None   Collection Time: 09/26/19 11:49 PM  Result Value Ref Range   Alcohol, Ethyl (B) <10 <10 mg/dL    Comment: (NOTE) Lowest detectable limit for serum alcohol is 10 mg/dL. For medical purposes only. Performed at [facility], [address], [place], [place] [address]   Blood gas, venous     Status: Abnormal   Collection Time: 09/27/19  1:29 AM  Result Value Ref Range   pH, Ven 7.293 7.250 - 7.430   pCO2, Ven 45.4 44.0 - 60.0 mmHg   pO2, Ven 39.0 32.0 - 45.0 mmHg   Bicarbonate 19.6 (L) 20.0 - 28.0 mmol/L   Acid-base deficit 4.2 (H) 0.0 - 2.0 mmol/L   O2 Saturation 64.8 %   Patient temperature 37.0    Collection site VENOUS    Drawn by [id number]     Comment: Performed at [facility], [address], [place], [place] [address]  Lactic acid, plasma     Status: Abnormal   Collection Time: 09/27/19  1:29 AM  Result Value Ref Range   Lactic Acid, Venous 2.2 (HH) 0.5 - 1.9 mmol/L    Comment: CRITICAL RESULT CALLED TO, READ BACK BY AND VERIFIED WITH: K BELTON,RN @0203  09/27/19 [facility] Performed at [facility], [address] [place], [place] [address]   HIV Antibody (routine testing w rflx)     Status: None   Collection Time: 09/27/19  1:29 AM  Result Value Ref Range   HIV Screen 4th Generation wRfx NON REACTIVE NON REACTIVE    Comment: Performed at [facility] [address] [place], [place] [address]  Magnesium     Status: None   Collection Time: 09/27/19  1:31 AM  Result Value Ref Range   Magnesium 2.1 1.7 - 2.4 mg/dL    Comment: Performed at [facility], [address] [place], [place] [address]  Phosphorus     Status: Abnormal   Collection Time: 09/27/19  1:31 AM  Result Value Ref Range    Phosphorus 5.8 (H) 2.5 - 4.6 mg/dL    Comment: Performed at [facility], [address] [place], [place] [address]  Lactic acid, plasma     Status: Abnormal   Collection Time: 09/27/19  3:19 AM  Result Value Ref Range   Lactic Acid, Venous 2.5 (HH) 0.5 - 1.9 mmol/L    Comment: CRITICAL RESULT CALLED TO, READ BACK BY AND VERIFIED WITH: MYRICK,B AT 4:35AM ON 09/27/19 BY [facility] Performed at [facility], [address], [place], [place] [address]   Urinalysis, Routine w reflex microscopic     Status: Abnormal   Collection Time: 09/27/19  3:30 AM  Result Value Ref Range   Color, Urine STRAW (A) YELLOW   APPearance CLEAR CLEAR   Specific Gravity, Urine 1.016 1.005 - 1.030   pH 5.0 5.0 - 8.0   Glucose, UA >=500 (A) NEGATIVE mg/dL   Hgb urine dipstick SMALL (A) NEGATIVE   Bilirubin Urine NEGATIVE NEGATIVE   Ketones, ur 80 (A) NEGATIVE mg/dL   Protein, ur NEGATIVE NEGATIVE mg/dL   Nitrite NEGATIVE NEGATIVE   Leukocytes,Ua NEGATIVE NEGATIVE   RBC / HPF 0-5 0 - 5 RBC/hpf   WBC, UA 0-5 0 - 5 WBC/hpf   Bacteria, UA NONE SEEN NONE SEEN   Squamous Epithelial / LPF 0-5 0 - 5   Mucus PRESENT     Comment: Performed at [facility], [address], [place], [place] [address]  Urine rapid drug screen (hosp performed)     Status: None   Collection Time: 09/27/19  3:30 AM  Result Value Ref Range   Opiates NONE DETECTED NONE DETECTED   Cocaine NONE DETECTED NONE DETECTED   Benzodiazepines NONE DETECTED NONE DETECTED   Amphetamines NONE DETECTED NONE DETECTED   Tetrahydrocannabinol NONE DETECTED NONE DETECTED   Barbiturates NONE DETECTED NONE DETECTED    Comment: (NOTE) DRUG SCREEN FOR MEDICAL PURPOSES ONLY.  IF CONFIRMATION IS NEEDED FOR ANY PURPOSE, NOTIFY LAB WITHIN 5 DAYS. LOWEST DETECTABLE LIMITS FOR URINE DRUG SCREEN Drug Class                     Cutoff (ng/mL) Amphetamine and metabolites    1000 Barbiturate and metabolites    200 Benzodiazepine                 [id number] Tricyclics  and metabolites     300 Opiates and metabolites        300 Cocaine and metabolites        300 THC                            50 Performed at [facility], [address] [place], [place] [address]   CBG monitoring, ED     Status: Abnormal   Collection Time: 09/27/19  3:35 AM  Result Value Ref Range   Glucose-Capillary >600 (HH) 70 - 99 mg/dL  CBG monitoring, ED     Status: Abnormal   Collection Time: 09/27/19  4:58 AM  Result Value Ref Range   Glucose-Capillary >600 (HH) 70 - 99 mg/dL  Basic metabolic panel     Status: Abnormal   Collection Time: 09/27/19  5:26 AM  Result Value Ref Range   Sodium 139 135 - 145 mmol/L   Potassium 3.8 3.5 - 5.1 mmol/L   Chloride 99 98 - 111 mmol/L   CO2 16 (L) 22 - 32 mmol/L  Glucose, Bld 666 (HH) 70 - 99 mg/dL    Comment: CRITICAL RESULT CALLED TO, READ BACK BY AND VERIFIED WITH: WALKER,T AT 5:55AM ON 09/27/19 BY FESTERMAN,C    BUN 21 (H) 6 - 20 mg/dL   Creatinine, Ser 1.41 (H) 0.44 - 1.00 mg/dL   Calcium 8.9 8.9 - 10.3 mg/dL   GFR calc non Af Amer 41 (L) >60 mL/min   GFR calc Af Amer 47 (L) >60 mL/min    Comment: Performed at [facility], [address] [place], [place] [address]  Brain natriuretic peptide     Status: None   Collection Time: 09/27/19  5:26 AM  Result Value Ref Range   B Natriuretic Peptide 42.0 0.0 - 100.0 pg/mL    Comment: Performed at [facility], [address] [place], [place] [address]  CBG monitoring, ED     Status: Abnormal   Collection Time: 09/27/19  6:09 AM  Result Value Ref Range   Glucose-Capillary 563 (HH) 70 - 99 mg/dL  CBG monitoring, ED     Status: Abnormal   Collection Time: 09/27/19  7:41 AM  Result Value Ref Range   Glucose-Capillary 478 (H) 70 - 99 mg/dL  CBG monitoring, ED     Status: Abnormal   Collection Time: 09/27/19  9:08 AM  Result Value Ref Range   Glucose-Capillary 393 (H) 70 - 99 mg/dL  CBG monitoring, ED     Status: Abnormal   Collection Time: 09/27/19 10:21 AM  Result Value Ref  Range   Glucose-Capillary 326 (H) 70 - 99 mg/dL  Basic metabolic panel     Status: Abnormal   Collection Time: 09/27/19 10:25 AM  Result Value Ref Range   Sodium 141 135 - 145 mmol/L   Potassium 3.3 (L) 3.5 - 5.1 mmol/L   Chloride 101 98 - 111 mmol/L   CO2 25 22 - 32 mmol/L   Glucose, Bld 350 (H) 70 - 99 mg/dL   BUN 17 6 - 20 mg/dL   Creatinine, Ser 1.17 (H) 0.44 - 1.00 mg/dL   Calcium 9.3 8.9 - 10.3 mg/dL   GFR calc non Af Amer 51 (L) >60 mL/min   GFR calc Af Amer 59 (L) >60 mL/min   Anion gap 15 5 - 15    Comment: Performed at [facility], [address], [place], [place] [address]  CBG monitoring, ED     Status: Abnormal   Collection Time: 09/27/19 11:30 AM  Result Value Ref Range   Glucose-Capillary 298 (H) 70 - 99 mg/dL  CBG monitoring, ED     Status: Abnormal   Collection Time: 09/27/19 12:40 PM  Result Value Ref Range   Glucose-Capillary 246 (H) 70 - 99 mg/dL  CBG monitoring, ED     Status: Abnormal   Collection Time: 09/27/19  1:45 PM  Result Value Ref Range   Glucose-Capillary 235 (H) 70 - 99 mg/dL  CBG monitoring, ED     Status: Abnormal   Collection Time: 09/27/19  2:04 PM  Result Value Ref Range   Glucose-Capillary 223 (H) 70 - 99 mg/dL  Basic metabolic panel     Status: Abnormal   Collection Time: 09/27/19  2:46 PM  Result Value Ref Range   Sodium 142 135 - 145 mmol/L   Potassium 2.9 (L) 3.5 - 5.1 mmol/L   Chloride 103 98 - 111 mmol/L   CO2 28 22 - 32 mmol/L   Glucose, Bld 208 (H) 70 - 99 mg/dL   BUN 14  6 - 20 mg/dL   Creatinine, Ser 0.93 0.44 - 1.00 mg/dL   Calcium 9.1 8.9 - 10.3 mg/dL   GFR calc non Af Amer >60 >60 mL/min   GFR calc Af Amer >60 >60 mL/min   Anion gap 11 5 - 15    Comment: Performed at [facility], [address] [place], [place] [address]  CBG monitoring, ED     Status: Abnormal   Collection Time: 09/27/19  3:20 PM  Result Value Ref Range   Glucose-Capillary 198 (H) 70 - 99 mg/dL  CBG monitoring, ED     Status: Abnormal    Collection Time: 09/27/19  4:23 PM  Result Value Ref Range   Glucose-Capillary 154 (H) 70 - 99 mg/dL  CBG monitoring, ED     Status: Abnormal   Collection Time: 09/27/19  5:25 PM  Result Value Ref Range   Glucose-Capillary 135 (H) 70 - 99 mg/dL  Basic metabolic panel     Status: Abnormal   Collection Time: 09/27/19  5:58 PM  Result Value Ref Range   Sodium 142 135 - 145 mmol/L   Potassium 3.2 (L) 3.5 - 5.1 mmol/L   Chloride 104 98 - 111 mmol/L   CO2 28 22 - 32 mmol/L   Glucose, Bld 139 (H) 70 - 99 mg/dL   BUN 13 6 - 20 mg/dL   Creatinine, Ser 0.89 0.44 - 1.00 mg/dL   Calcium 9.1 8.9 - 10.3 mg/dL   GFR calc non Af Amer >60 >60 mL/min   GFR calc Af Amer >60 >60 mL/min   Anion gap 10 5 - 15    Comment: Performed at [facility], [address], [place], [place] [address]   No results found.  Pending Labs Unresulted Labs (From admission, onward)    Start     Ordered   09/28/19 [id number]  Basic metabolic panel  Tomorrow morning,   R    Question:  Specimen collection method  Answer:  Lab=Lab collect   09/27/19 1830          Vitals/Pain Today's Vitals   09/27/19 0600 09/27/19 0630 09/27/19 1530 09/27/19 1630  BP: 97/76 112/88 (!) 146/95 (!) 182/75  Pulse:    99  Resp:   16 18  Temp:      SpO2:   95% 97%  Weight:      PainSc:        Isolation Precautions No active isolations  Medications Medications  sodium chloride 0.9 % bolus 1,000 mL (0 mLs Intravenous Stopped 09/27/19 0453)    And  0.9 %  sodium chloride infusion ( Intravenous Not Given 09/27/19 0321)  0.45 % NaCl with KCl 20 mEq / L infusion ( Intravenous Stopped 09/27/19 0452)  enoxaparin (LOVENOX) injection 70 mg (70 mg Subcutaneous Not Given 09/27/19 1819)  insulin regular, human (MYXREDLIN) 100 units/ 100 mL infusion (0 Units/hr Intravenous Stopped 09/27/19 1817)  0.45 % NaCl with KCl 20 mEq / L infusion ( Intravenous Not Given 09/27/19 1646)  potassium chloride SA (KLOR-CON) CR tablet 40 mEq (40 mEq Oral Given  09/27/19 1818)  insulin aspart protamine- aspart (NOVOLOG MIX 70/30) injection 80 Units (80 Units Subcutaneous Given 09/27/19 1817)  insulin aspart (novoLOG) injection 0-20 Units (0 Units Subcutaneous Not Given 09/27/19 1815)  insulin aspart (novoLOG) injection 0-5 Units (has no administration in time range)  0.9 %  sodium chloride infusion ( Intravenous New Bag/Given 09/27/19 1849)  LORazepam (ATIVAN) injection 1 mg (1 mg Intravenous Given 09/27/19  1818)  LORazepam (ATIVAN) injection 2 mg (2 mg Intravenous Given 09/27/19 0415)  magnesium sulfate IVPB 2 g 50 mL (0 g Intravenous Stopped 09/27/19 0626)  insulin aspart (novoLOG) injection 10 Units (10 Units Intravenous Bolus 09/27/19 0415)  sodium chloride 0.45 % bolus 1,000 mL (0 mLs Intravenous Stopped 09/27/19 0626)    Mobility manual wheelchair High fall risk   Focused Assessments DKA   R Recommendations: See Admitting Provider Note  Report given to:   Additional Notes:

## 2019-09-27 NOTE — BH Assessment (Signed)
[facility] Assessment Progress Note    Pt glucose at 741.  Clinician spoke with Dr. Rolland Porter.  She said patient is being medically admitted for dka.  Patient will need to be seen by psychiatry before being discharged from med floor however.

## 2019-09-27 NOTE — ED Notes (Signed)
Date and time results received: 09/27/19 0203 (use smartphrase ".now" to insert current time)  Test: lactic acid Critical Value: 2.2  Name of Provider Notified: Dr Olevia Bowens  Orders Received? Or Actions Taken?: Actions Taken: no orders received

## 2019-09-27 NOTE — ED Provider Notes (Addendum)
[facility] EMERGENCY DEPARTMENT Provider Note   CSN: [id number] Arrival date & time: 09/26/19  [id number]   Time seen 11:30 PM  History   Chief Complaint Chief Complaint  Patient presents with  . Medical Clearance    HPI [name] is a 58 y.o. female.     HPI patient was in the ED last night for psychiatric evaluation.  She was evaluated this morning and she was felt to be not suicidal and not homicidal and was discharged home.  When I asked the patient what happened she states she went home and she felt fine and then she started having pulling back pain.  Her husband is sitting at the bedside shaking his head no.  He states she is "talking crazy".  He states she is seeing people who are not there and hearing voices.  He also states she has been making statements that she does not want to live and she wants to die although patient denies that she said that.  He said she was admitted in February to [facility] for a week and she seemed to be doing better.  She was discharged home on no medications.  He states she had a diagnosis of possible dementia and he is unaware of any other diagnoses.  He states he started noticing a change in March or April and it has been getting progressively worse.  His comment to me is "I cannot take her home like this".  Please note patient answers questions very inappropriately.  We will be talking about 1 thing and then she starts talking about something totally different.  When I asked her what year it is she states "it is the 13th".  When I asked her what month it is she states "it is Christmas".  She told me her birthdate is Apr 26, 1961 and then said it was in the 22nd year.  Her husband states her birthday is [birth year].  PCP [name], [name], NP   Past Medical History:  Diagnosis Date  . Asthma   . COPD (chronic obstructive pulmonary disease) ([place])   . Depression   . Diabetes mellitus   . Diastolic dysfunction Q000111Q  . Hypertension   .  Prolonged QT interval 05/14/2015    Patient Active Problem List   Diagnosis Date Noted  . Diastolic dysfunction Q000111Q  . Prolonged QT interval 05/14/2015  . Palpitations 07/23/2014  . Generalized weakness 07/23/2014  . Hypokalemia 07/22/2014  . Diabetes mellitus ([place]) 07/22/2014  . Hypertension 07/22/2014  . COPD (chronic obstructive pulmonary disease) ([place]) 07/22/2014  . Depression 07/22/2014    Past Surgical History:  Procedure Laterality Date  . CATARACT EXTRACTION    . CESAREAN SECTION    . CHOLECYSTECTOMY       OB History    Gravida  3   Para  2   Term  2   Preterm      AB  1   Living  2     SAB  1   TAB      Ectopic      Multiple      Live Births               Home Medications    Prior to Admission medications   Medication Sig Start Date End Date Taking? Authorizing Provider  albuterol (PROAIR HFA) 108 (90 BASE) MCG/ACT inhaler Inhale 2 puffs into the lungs every 6 (six) hours as needed for wheezing or shortness of breath.  [provider]  bethanechol (URECHOLINE) 25 MG tablet Take 25 mg by mouth 3 (three) times daily. 09/10/19   [provider]  dicyclomine (BENTYL) 20 MG tablet Take 20 mg by mouth 3 (three) times daily.  02/21/18   [provider]  diphenhydrAMINE (BENADRYL) 25 MG tablet Take 25 mg by mouth every 6 (six) hours as needed for allergies.    [provider]  escitalopram (LEXAPRO) 20 MG tablet Take 1 tablet (20 mg total) by mouth daily. 08/16/19   Fawze, Mina A, PA-C  esomeprazole (NEXIUM) 20 MG capsule Take 20 mg by mouth daily at 12 noon.    [provider]  gabapentin (NEURONTIN) 100 MG capsule Take 100 mg by mouth 3 (three) times daily. 08/24/19   [provider]  hydrOXYzine (ATARAX/VISTARIL) 10 MG tablet Take 10 mg by mouth every 8 (eight) hours as needed. 08/24/19   [provider]  insulin aspart protamine- aspart (NOVOLOG MIX 70/30) (70-30) 100 UNIT/ML  injection Inject 60 Units into the skin 2 (two) times daily with a meal.    [provider]  meclizine (ANTIVERT) 12.5 MG tablet Take 12.5 mg by mouth 2 (two) times daily as needed for dizziness.  12/25/18   [provider]  potassium chloride SA (K-DUR) 20 MEQ tablet Take 20 mEq by mouth 2 (two) times daily.    [provider]  propranolol ER (INDERAL LA) 120 MG 24 hr capsule Take 120 mg by mouth at bedtime.     [provider]  sertraline (ZOLOFT) 25 MG tablet Take 1 tablet (25 mg total) by mouth daily. 09/26/19   Isla Pence, MD  torsemide (DEMADEX) 20 MG tablet Take 20 mg by mouth 2 (two) times daily.  03/28/18   [provider]    Family History Family History  Problem Relation Age of Onset  . Stroke Mother   . Diabetes Father   . Heart failure Father   . Hypertension Father   . Diabetes Sister   . Heart failure Sister   . Hypertension Sister   . Stroke Sister   . Cancer Other   . Stroke Sister     Social History Social History   Tobacco Use  . Smoking status: Passive Smoke Exposure - Never Smoker  . Smokeless tobacco: Never Used  Substance Use Topics  . Alcohol use: No    Alcohol/week: 0.0 standard drinks  . Drug use: No  lives at home Lives with spouse   Allergies   Benicar [olmesartan], Codeine, Sulfa antibiotics, and Trulicity [dulaglutide]   Review of Systems Review of Systems  All other systems reviewed and are negative.    Physical Exam Updated Vital Signs BP (!) 146/63 (BP Location: Right Arm)   Pulse (!) 105   Temp 98.1 F (36.7 C)   Resp 20   Wt (!) 140 kg   LMP 08/06/2012   SpO2 94%   BMI 58.32 kg/m   Physical Exam Vitals signs and nursing note reviewed.  Constitutional:      General: She is not in acute distress.    Appearance: Normal appearance. She is obese.  HENT:     Head: Normocephalic and atraumatic.     Right Ear: External ear normal.     Left Ear: External ear normal.     Nose:  Nose normal.     Mouth/Throat:     Mouth: Mucous membranes are moist.     Pharynx: No oropharyngeal exudate or posterior oropharyngeal erythema.  Eyes:     Extraocular Movements: Extraocular movements intact.     Conjunctiva/sclera: Conjunctivae normal.     Pupils: Pupils are equal, round, and reactive to light.  Neck:     Musculoskeletal: Normal range of motion.  Cardiovascular:     Rate and Rhythm: Normal rate and regular rhythm.  Pulmonary:     Effort: Pulmonary effort is normal. No respiratory distress.  Abdominal:     Comments: Obese, soft  Musculoskeletal: Normal range of motion.  Skin:    General: Skin is warm and dry.     Comments: Patient has some chronic changes or redness of her lower legs bilaterally.  Neurological:     General: No focal deficit present.     Mental Status: She is alert.     Comments: Patient has difficulty following instructions to do the neurological exam.  She is very confused and does not know the year or month for her birthday.  Psychiatric:        Mood and Affect: Mood normal.        Behavior: Behavior normal.        Thought Content: Thought content normal.      ED Treatments / Results  Labs (all labs ordered are listed, but only abnormal results are displayed) Results for orders placed or performed during the hospital encounter of 09/26/19  Comprehensive metabolic panel  Result Value Ref Range   Sodium 136 135 - 145 mmol/L   Potassium 4.2 3.5 - 5.1 mmol/L   Chloride 93 (L) 98 - 111 mmol/L   CO2 19 (L) 22 - 32 mmol/L   Glucose, Bld 741 (HH) 70 - 99 mg/dL   BUN 17 6 - 20 mg/dL   Creatinine, Ser 1.33 (H) 0.44 - 1.00 mg/dL   Calcium 9.3 8.9 - 10.3 mg/dL   Total Protein 7.1 6.5 - 8.1 g/dL   Albumin 3.7 3.5 - 5.0 g/dL   AST 30 15 - 41 U/L   ALT 22 0 - 44 U/L   Alkaline Phosphatase 108 38 - 126 U/L   Total Bilirubin 2.1 (H) 0.3 - 1.2 mg/dL   GFR calc non Af Amer 44 (L) >60 mL/min   GFR calc Af Amer 51 (L) >60 mL/min   Anion gap 24 (H)  5 - 15  CBC with Differential  Result Value Ref Range   WBC 8.7 4.0 - 10.5 K/uL   RBC 5.69 (H) 3.87 - 5.11 MIL/uL   Hemoglobin 16.4 (H) 12.0 - 15.0 g/dL   HCT 51.3 (H) 36.0 - 46.0 %   MCV 90.2 80.0 - 100.0 fL   MCH 28.8 26.0 - 34.0 pg   MCHC 32.0 30.0 - 36.0 g/dL   RDW 13.3 11.5 - 15.5 %   Platelets 268 150 - 400 K/uL   nRBC 0.0 0.0 - 0.2 %   Neutrophils Relative % 86 %   Neutro Abs 7.5 1.7 - 7.7 K/uL   Lymphocytes Relative 9 %   Lymphs Abs 0.8 0.7 - 4.0 K/uL   Monocytes Relative 4 %   Monocytes Absolute 0.3 0.1 - 1.0 K/uL   Eosinophils Relative 0 %   Eosinophils Absolute 0.0 0.0 - 0.5 K/uL   Basophils Relative 1 %   Basophils Absolute 0.1 0.0 - 0.1 K/uL   Immature Granulocytes 0 %   Abs Immature Granulocytes 0.03 0.00 - 0.07 K/uL  Ethanol  Result Value Ref Range   Alcohol, Ethyl (B) <10 <10 mg/dL   Laboratory interpretation all normal  except DKA    EKG None  Radiology Dg Chest Port 1 View  Result Date: 09/25/2019 CLINICAL DATA:  Shortness of breath for 2 hours EXAM: PORTABLE CHEST 1 VIEW COMPARISON:  Radiograph 09/20/2019 FINDINGS: Imaging quality degraded by patient body habitus. Increasing hazy interstitial opacities with some interval cephalization of the vascularity. Prominent cardiac silhouette may in part be due to the portable technique. No pneumothorax or effusion. No acute osseous or soft tissue abnormality. IMPRESSION: 1. Increasing hazy interstitial opacities with some interval cephalization of the vascularity, suspicious for interstitial edema. No pleural effusion. 2. Imaging quality degraded by patient body habitus. Electronically Signed   By: Lovena Le M.D.   On: 09/25/2019 16:12    Procedures .Critical Care Performed by: Rolland Porter, MD Authorized by: Rolland Porter, MD   Critical care provider statement:    Critical care time (minutes):  33   Critical care was necessary to treat or prevent imminent or life-threatening deterioration of the following  conditions:  Endocrine crisis   Critical care was time spent personally by me on the following activities:  Discussions with consultants, examination of patient, obtaining history from patient or surrogate, ordering and review of laboratory studies, pulse oximetry and re-evaluation of patient's condition   (including critical care time)  Medications Ordered in ED Medications  insulin regular, human (MYXREDLIN) 100 units/ 100 mL infusion (has no administration in time range)  sodium chloride 0.9 % bolus 1,000 mL (has no administration in time range)    And  0.9 %  sodium chloride infusion (has no administration in time range)  0.9 %  sodium chloride infusion (has no administration in time range)  dextrose 5 %-0.45 % sodium chloride infusion (has no administration in time range)  potassium chloride 10 mEq in 100 mL IVPB (has no administration in time range)     Initial Impression / Assessment and Plan / ED Course  I have reviewed the triage vital signs and the nursing notes.  Pertinent labs & imaging results that were available during my care of the patient were reviewed by me and considered in my medical decision making (see chart for details).    Patient seems confused, her tentative diagnosis of dementia earlier this year may be correct.  TTS consult was ordered again.    12:50 AM nurse reports patient's blood glucose was in the 700s.  When I got back to my desk at 1:15 AM and reviewed her lab results she actually is in DKA with an anion gap of 24.  She was started on IV fluids and insulin drip per protocol.  1:24 AM Dr. Olevia Bowens, hospitalist, will admit.  Patient has not had her TTS consult yet.  1:40 AM Marcus, TTS called.  I informed him she was going to be admitted medically, he states they can do a consult in the hospital when they are ready.  Final Clinical Impressions(s) / ED Diagnoses   Final diagnoses:  Diabetic ketoacidosis without coma associated with type 2 diabetes  mellitus ([place])  Confusion    Plan admission  Rolland Porter, MD, Barbette Or, MD 09/27/19 0126    Rolland Porter, MD 09/27/19 [phone]

## 2019-09-27 NOTE — H&P (Signed)
History and Physical    [name] [id number] DOB: 1961-10-27 DOA: 09/26/2019  PCP: Bridget Hartshorn, NP   Patient coming from: Home.  I have personally briefly reviewed patient's old medical records in [place]  Chief Complaint: DKA.  HPI: [name] is a 58 y.o. female with medical history significant of asthma, COPD, depression, type 2 diabetes, systolic dysfunction, hypertension, prolonged QT interval who was brought to the emergency department on 09/26/2019 evening due to the patient having hallucinations.  She was seen for similar symptoms the previous day and was discharged home.  The patient is currently having hallucinations and unable to provide further history.  Her husband stated her earlier to the EDP, that she has been "talking crazy" home.  She has been complaining of visual and auditory hallucinations.  The case is being present to [place] now, since the patient apparently may have missed some of her insulin doses and is now on DKA.  ED Course: Initial vital signs initial vital signs temperature 98.1 F, pulse 113, respiration 20, blood pressure 1 146/63 mmHg and O2 sat 95% on room air.  The patient was given fluids/electrolyte replacement and started on an insulin infusion.  White count is 8.7, hemoglobin 16.4 g/dL and platelets 268.  Sodium is 136, potassium 4.2, chloride 93 and CO2 19 mmol/L.  Glucose 741, BUN 17 and creatinine 1.33 mg/dL.  Her previous creatinine from the day before was 0.89 mg/dL.  Her bilirubin is now increased to 2.16 from 0.9 mg/dL in August 16, 2019.  Venous blood gas shows decreased bicarbonate 19.6 and increased acid base deficit of 4.2 mmol/L.  Phosphorus was 5.9 magnesium 2.1 mg/dL.  Lactic acid slightly elevated at 2.2 mmol/L.Marland Kitchen Her chest radiograph was suspicious for interstitial edema.  Review of Systems: Unable to obtain.  Past Medical History:  Diagnosis Date  . Asthma   . COPD (chronic obstructive pulmonary  disease) ([place])   . Depression   . Diabetes mellitus   . Diastolic dysfunction Q000111Q  . Hypertension   . Prolonged QT interval 05/14/2015    Past Surgical History:  Procedure Laterality Date  . CATARACT EXTRACTION    . CESAREAN SECTION    . CHOLECYSTECTOMY       reports that she is a non-smoker but has been exposed to tobacco smoke. She has never used smokeless tobacco. She reports that she does not drink alcohol or use drugs.  Allergies  Allergen Reactions  . Benicar [Olmesartan] Swelling  . Codeine Other (See Comments)    Confusion   . Sulfa Antibiotics Swelling    Whole face swells  . Trulicity [Dulaglutide] Diarrhea    Family History  Problem Relation Age of Onset  . Stroke Mother   . Diabetes Father   . Heart failure Father   . Hypertension Father   . Diabetes Sister   . Heart failure Sister   . Hypertension Sister   . Stroke Sister   . Cancer Other   . Stroke Sister    Prior to Admission medications   Medication Sig Start Date End Date Taking? Authorizing Provider  albuterol (PROAIR HFA) 108 (90 BASE) MCG/ACT inhaler Inhale 2 puffs into the lungs every 6 (six) hours as needed for wheezing or shortness of breath.    [provider]  bethanechol (URECHOLINE) 25 MG tablet Take 25 mg by mouth 3 (three) times daily. 09/10/19   [provider]  dicyclomine (BENTYL) 20 MG tablet Take 20 mg by mouth 3 (  three) times daily.  02/21/18   [provider]  diphenhydrAMINE (BENADRYL) 25 MG tablet Take 25 mg by mouth every 6 (six) hours as needed for allergies.    [provider]  escitalopram (LEXAPRO) 20 MG tablet Take 1 tablet (20 mg total) by mouth daily. 08/16/19   Fawze, Mina A, PA-C  esomeprazole (NEXIUM) 20 MG capsule Take 20 mg by mouth daily at 12 noon.    [provider]  gabapentin (NEURONTIN) 100 MG capsule Take 100 mg by mouth 3 (three) times daily. 08/24/19   [provider]  hydrOXYzine (ATARAX/VISTARIL) 10 MG  tablet Take 10 mg by mouth every 8 (eight) hours as needed. 08/24/19   [provider]  insulin aspart protamine- aspart (NOVOLOG MIX 70/30) (70-30) 100 UNIT/ML injection Inject 60 Units into the skin 2 (two) times daily with a meal.    [provider]  meclizine (ANTIVERT) 12.5 MG tablet Take 12.5 mg by mouth 2 (two) times daily as needed for dizziness.  12/25/18   [provider]  potassium chloride SA (K-DUR) 20 MEQ tablet Take 20 mEq by mouth 2 (two) times daily.    [provider]  propranolol ER (INDERAL LA) 120 MG 24 hr capsule Take 120 mg by mouth at bedtime.     [provider]  sertraline (ZOLOFT) 25 MG tablet Take 1 tablet (25 mg total) by mouth daily. 09/26/19   Isla Pence, MD  torsemide (DEMADEX) 20 MG tablet Take 20 mg by mouth 2 (two) times daily.  03/28/18   [provider]    Physical Exam: Vitals:   09/26/19 1932 09/26/19 1933 09/26/19 2139  BP:  (!) 157/140 (!) 146/63  Pulse:  (!) 113 (!) 105  Resp:  20 20  Temp:  98.1 F (36.7 C)   SpO2:  95% 94%  Weight: (!) 140 kg      Constitutional: NAD, calm, comfortable Eyes: PERRL, lids and conjunctivae normal ENMT: Mucous membranes are dry. Posterior pharynx clear of any exudate or lesions. Neck: normal, supple, no masses, no thyromegaly Respiratory: clear to auscultation bilaterally, no wheezing, no crackles. Normal respiratory effort. No accessory muscle use.  Cardiovascular: Regular rate and rhythm, no murmurs / rubs / gallops. No extremity edema. 2+ pedal pulses. No carotid bruits.  Abdomen: Obese, nondistended. Bowel sounds positive.  Soft, no tenderness, no masses palpated. No hepatosplenomegaly.  Musculoskeletal: no clubbing / cyanosis.  Good ROM, no contractures. Normal muscle tone.  Skin: no rashes, lesions, ulcers. No induration Neurologic: Grossly nonfocal. Psychiatric: Oriented to name only.  Labs on Admission: I have personally reviewed following labs and  imaging studies  CBC: Recent Labs  Lab 09/20/19 0327 09/25/19 1554 09/26/19 2349  WBC 8.5 7.8 8.7  NEUTROABS 5.6 5.3 7.5  HGB 16.0* 16.4* 16.4*  HCT 47.3* 48.7* 51.3*  MCV 86.2 86.0 90.2  PLT 289 283 [id number]   Basic Metabolic Panel: Recent Labs  Lab 09/20/19 0327 09/25/19 1554 09/26/19 2349 09/27/19 0131  NA 136 135 136  --   K 3.2* 2.6* 4.2  --   CL 95* 95* 93*  --   CO2 29 26 19*  --   GLUCOSE 324* 219* 741*  --   BUN 9 8 17   --   CREATININE 0.98 0.89 1.33*  --   CALCIUM 9.0 8.8* 9.3  --   MG  --  1.7  --  2.1  PHOS  --   --   --  5.8*  GFR: Estimated Creatinine Clearance: 61.6 mL/min (A) (by C-G formula based on SCr of 1.33 mg/dL (H)). Liver Function Tests: Recent Labs  Lab 09/26/19 2349  AST 30  ALT 22  ALKPHOS 108  BILITOT 2.1*  PROT 7.1  ALBUMIN 3.7   No results for input(s): LIPASE, AMYLASE in the last 168 hours. Recent Labs  Lab 09/25/19 1719  AMMONIA 29   Coagulation Profile: No results for input(s): INR, PROTIME in the last 168 hours. Cardiac Enzymes: No results for input(s): CKTOTAL, CKMB, CKMBINDEX, TROPONINI in the last 168 hours. BNP (last 3 results) No results for input(s): PROBNP in the last 8760 hours. HbA1C: No results for input(s): HGBA1C in the last 72 hours. CBG: Recent Labs  Lab 09/25/19 2211 09/26/19 0800  GLUCAP 196* 254*   Lipid Profile: No results for input(s): CHOL, HDL, LDLCALC, TRIG, CHOLHDL, LDLDIRECT in the last 72 hours. Thyroid Function Tests: Recent Labs    09/25/19 1554  TSH 2.772   Anemia Panel: No results for input(s): VITAMINB12, FOLATE, FERRITIN, TIBC, IRON, RETICCTPCT in the last 72 hours. Urine analysis:    Component Value Date/Time   COLORURINE YELLOW 09/25/2019 1552   APPEARANCEUR HAZY (A) 09/25/2019 1552   LABSPEC 1.014 09/25/2019 1552   PHURINE 5.0 09/25/2019 1552   GLUCOSEU 50 (A) 09/25/2019 1552   HGBUR SMALL (A) 09/25/2019 1552   BILIRUBINUR NEGATIVE 09/25/2019 1552   KETONESUR NEGATIVE  09/25/2019 1552   PROTEINUR >=300 (A) 09/25/2019 1552   UROBILINOGEN 0.2 05/15/2015 0112   NITRITE NEGATIVE 09/25/2019 1552   LEUKOCYTESUR NEGATIVE 09/25/2019 1552    Radiological Exams on Admission: Dg Chest Port 1 View  Result Date: 09/25/2019 CLINICAL DATA:  Shortness of breath for 2 hours EXAM: PORTABLE CHEST 1 VIEW COMPARISON:  Radiograph 09/20/2019 FINDINGS: Imaging quality degraded by patient body habitus. Increasing hazy interstitial opacities with some interval cephalization of the vascularity. Prominent cardiac silhouette may in part be due to the portable technique. No pneumothorax or effusion. No acute osseous or soft tissue abnormality. IMPRESSION: 1. Increasing hazy interstitial opacities with some interval cephalization of the vascularity, suspicious for interstitial edema. No pleural effusion. 2. Imaging quality degraded by patient body habitus. Electronically Signed   By: Lovena Le M.D.   On: 09/25/2019 16:12    EKG: Independently reviewed. Significant for prolonged QT interval.  Assessment/Plan Principal Problem:   DKA (diabetic ketoacidosis) ([place]) Admit to stepdown unit/inpatient. Keep n.p.o. for now. Continue IV fluids. Continue insulin infusion. Monitor CBG hourly. BMP every 4 hours. Correct electrolytes as needed.  Active Problems:   Hypertension Continue propranolol ER 120 mg p.o. bedtime.    COPD (chronic obstructive pulmonary disease) ([facility]) Asymptomatic at this time. Supplemental oxygen and bronchodilators as needed.    Prolonged QT interval Magnesium sulfate 2 g IVPB. Optimize electrolytes.    AKI (acute kidney injury) ([place]) Continue IV hydration. Monitor intake and output. Follow-up renal function electrolytes.    DVT prophylaxis: Lovenox SQ. Code Status: Full code. Family Communication:  Disposition Plan: Admit for DKA treatment. Consults called: Admission status: Inpatient/stepdown.   Reubin Milan MD [facility]   If 7PM-7AM, please contact night-coverage  09/27/2019, 2:51 AM   This document was prepared using Dragon voice recognition software and may contain some unintended transcription errors.

## 2019-09-27 NOTE — Progress Notes (Signed)
  Patient seen and evaluated, chart reviewed, please see EMR for updated orders. Please see full H&P dictated by admitting physician Dr Olevia Bowens for same date of service.    -- DKA--- bicarb is normalized, anion gap is closed, transition off IV insulin, okay to restart subcu insulin with 70/30 insulin at 80 units twice daily, PTA patient was on 60 units twice daily, -Additional sliding scale coverage  AKI--creatinine is down to 1.1 from 1.41 after hydration, baseline creatinine around 0.9   Roxan Hockey, MD

## 2019-09-27 NOTE — ED Notes (Signed)
CRITICAL VALUE ALERT  Critical Value:  Lactic acid 2.5  Date & Time Notied:  09/27/2019 0433  Provider Notified: [email or web address]  Orders Received/Actions taken: n/a

## 2019-09-27 NOTE — ED Notes (Signed)
Myrle Sheng (son) [phone]

## 2019-09-28 DIAGNOSIS — I10 Essential (primary) hypertension: Secondary | ICD-10-CM

## 2019-09-28 LAB — GLUCOSE, CAPILLARY
Glucose-Capillary: 108 mg/dL — ABNORMAL HIGH (ref 70–99)
Glucose-Capillary: 395 mg/dL — ABNORMAL HIGH (ref 70–99)

## 2019-09-28 LAB — CBC
HCT: 46.8 % — ABNORMAL HIGH (ref 36.0–46.0)
Hemoglobin: 15.6 g/dL — ABNORMAL HIGH (ref 12.0–15.0)
MCH: 29.1 pg (ref 26.0–34.0)
MCHC: 33.3 g/dL (ref 30.0–36.0)
MCV: 87.3 fL (ref 80.0–100.0)
Platelets: 322 10*3/uL (ref 150–400)
RBC: 5.36 MIL/uL — ABNORMAL HIGH (ref 3.87–5.11)
RDW: 13.5 % (ref 11.5–15.5)
WBC: 12.8 10*3/uL — ABNORMAL HIGH (ref 4.0–10.5)
nRBC: 0 % (ref 0.0–0.2)

## 2019-09-28 LAB — BASIC METABOLIC PANEL
Anion gap: 11 (ref 5–15)
BUN: 10 mg/dL (ref 6–20)
CO2: 25 mmol/L (ref 22–32)
Calcium: 9.1 mg/dL (ref 8.9–10.3)
Chloride: 109 mmol/L (ref 98–111)
Creatinine, Ser: 0.87 mg/dL (ref 0.44–1.00)
GFR calc Af Amer: >60 mL/min (ref >60)
GFR calc non Af Amer: >60 mL/min (ref >60)
Glucose, Bld: 72 mg/dL (ref 70–99)
Potassium: 3.4 mmol/L — ABNORMAL LOW (ref 3.5–5.1)
Sodium: 145 mmol/L (ref 135–145)

## 2019-09-28 LAB — MAGNESIUM: Magnesium: 2 mg/dL (ref 1.7–2.4)

## 2019-09-28 LAB — PHOSPHORUS: Phosphorus: 2.3 mg/dL — ABNORMAL LOW (ref 2.5–4.6)

## 2019-09-28 MED ORDER — CARVEDILOL 3.125 MG PO TABS
6.2500 mg | ORAL_TABLET | Freq: Two times a day (BID) | ORAL | Status: DC
  Administered 2019-09-28 – 2019-09-30 (×5): 6.25 mg via ORAL
  Filled 2019-09-28 (×6): qty 2

## 2019-09-28 MED ORDER — QUETIAPINE FUMARATE 25 MG PO TABS: 25.0000 mg | ORAL_TABLET | Freq: Every day | ORAL | Status: DC

## 2019-09-28 MED ORDER — AMLODIPINE BESYLATE 5 MG PO TABS
10.0000 mg | ORAL_TABLET | Freq: Every day | ORAL | Status: DC
  Administered 2019-09-28 – 2019-10-05 (×8): 10 mg via ORAL
  Filled 2019-09-28 (×8): qty 2

## 2019-09-28 MED ORDER — ISOSORBIDE MONONITRATE ER 60 MG PO TB24
30.0000 mg | ORAL_TABLET | Freq: Every day | ORAL | Status: DC
  Administered 2019-09-28 – 2019-10-05 (×8): 30 mg via ORAL
  Filled 2019-09-28 (×8): qty 1

## 2019-09-28 MED ORDER — HYDRALAZINE HCL 25 MG PO TABS
25.0000 mg | ORAL_TABLET | Freq: Three times a day (TID) | ORAL | Status: DC
  Administered 2019-09-28 – 2019-09-30 (×8): 25 mg via ORAL
  Filled 2019-09-28 (×10): qty 1

## 2019-09-28 MED ORDER — POTASSIUM CHLORIDE CRYS ER 20 MEQ PO TBCR
40.0000 meq | EXTENDED_RELEASE_TABLET | Freq: Once | ORAL | Status: AC
  Administered 2019-09-28: 40 meq via ORAL
  Filled 2019-09-28: qty 2

## 2019-09-28 MED ORDER — BUSPIRONE HCL 5 MG PO TABS
5.0000 mg | ORAL_TABLET | Freq: Three times a day (TID) | ORAL | Status: DC
  Administered 2019-09-28 – 2019-10-01 (×11): 5 mg via ORAL
  Filled 2019-09-28 (×12): qty 1

## 2019-09-28 NOTE — Progress Notes (Signed)
Patient Demographics:    [name], is a 58 y.o. female, DOB - 12/09/1960, [id number]  Admit date - 09/26/2019   Admitting Physician Marie Borowski Denton Brick, MD  Outpatient Primary MD for the patient is [name], [name], NP  LOS - 1   Chief Complaint  Patient presents with  . Medical Clearance        Subjective:    [name] today has no fevers, no emesis,  No chest pain,   -Remains confused and disoriented  Assessment  & Plan :    Principal Problem:   DKA (diabetic ketoacidosis) ([place]) Active Problems:   Hypertension   COPD (chronic obstructive pulmonary disease) ([facility])   Prolonged QT interval   AKI (acute kidney injury) ([place])   Brief Summary --  1)DKA/uncontrolled diabetes-- bicarb is normalized, anion gap is closed, transition off IV insulin,  A1C is 10-reflecting uncontrolled diabetes C/n  subcu insulin with 70/30 insulin at 80 units twice daily, PTA patient was on 60 units twice daily, -Additional sliding scale coverage -Patient sees endocrinologist at [facility]  2)Depression and Anxiety/Psychosis --- patient evaluated by psychiatrist Dr. Hampton Abbot on 09/26/2019, states no need for inpatient psychiatric care at this time,  continue Zoloft 25 mg daily -Patient previously treated at [facility] inpatient psychiatric behavioral unit from March through April 2020 for psychosis. -Patient having hallucinations and psychosis at this time, - -limited with ability to use antipsychotics due to prolonged QT -Add BuSpar, may use iv lorazepam as needed  3)Generalized weakness and deconditioning / disposition -discussed with patient's husband, he weighs 140 pounds, patient weighs about 300 pounds and is having difficulties with mobility related ADLs -Husband states he is on disability on unable to physically provide the Patient needs at this time -Await physical therapy  evaluation and recommendations may need SNF rehab  4)HTN--uncontrolled, continue amlodipine 10 mg daily, add Coreg 6.25 mg twice daily, added Imdur and hydralazine -IV labetalol as needed elevated BP  5)AKI--creatinine is down to 1.1 from 1.41 after hydration, baseline creatinine around 0.9   Disposition/Need for in-Hospital Stay- patient unable to be discharged at this time due to -inability to perform ADLs, unsafe discharge plan.  Unable to return to preadmission environment  Code Status : Full  Family Communication:   Discussed with pt's husband at  -[name]-- at [phone]  Disposition Plan  : TBD  Consults  :  Psych  DVT Prophylaxis  :  Lovenox -   Lab Results  Component Value Date   PLT 322 09/28/2019    Inpatient Medications  Scheduled Meds: . amLODipine  10 mg Oral Daily  . carvedilol  6.25 mg Oral BID WC  . enoxaparin (LOVENOX) injection  70 mg Subcutaneous Q24H  . famotidine  20 mg Oral QHS  . hydrALAZINE  25 mg Oral TID  . insulin aspart  0-20 Units Subcutaneous TID WC  . insulin aspart  0-5 Units Subcutaneous QHS  . insulin aspart protamine- aspart  80 Units Subcutaneous BID WC  . isosorbide mononitrate  30 mg Oral Daily  . QUEtiapine  25 mg Oral QHS   Continuous Infusions: . sodium chloride 100 mL/hr at 09/28/19 0557   PRN Meds:.albuterol, LORazepam    Anti-infectives (From admission, onward)   None  Objective:   Vitals:   09/27/19 2022 09/27/19 2033 09/28/19 0607 09/28/19 1300  BP:  (!) 152/76 (!) 179/91 (!) 182/86  Pulse:  (!) 102 (!) 106 100  Resp:  18 19 18   Temp:   98.9 F (37.2 C) 98.8 F (37.1 C)  TempSrc:   Oral Oral  SpO2: 94% 98% 95% 96%  Weight:  132.4 kg    Height:  5\' 1"  (1.549 m)      Wt Readings from Last 3 Encounters:  09/27/19 132.4 kg  09/25/19 136.1 kg  09/20/19 136.1 kg     Intake/Output Summary (Last 24 hours) at 09/28/2019 1721 Last data filed at 09/28/2019 0200 Gross per 24 hour   Intake 718.33 ml  Output -  Net 718.33 ml     Physical Exam  Gen:- Awake Alert, morbidly obese, HEENT:- [place].AT, No sclera icterus Neck-Supple Neck,No JVD,.  Lungs-  CTAB , fa3ir symmetrical air movement CV- S1, S2 normal, regular  Abd-  +ve B.Sounds, Abd Soft, No tenderness, increased truncal adiposity    Extremity/Skin:- No  edema, pedal pulses present  Psych-patient is confused, disoriented , hallucinating and psychotic neuro-generalized weakness no new focal deficits, no tremors   Data Review:   Micro Results No results found for this or any previous visit (from the past 240 hour(s)).  Radiology Reports Dg Chest 2 View  Result Date: 09/20/2019 CLINICAL DATA:  Shortness of breath EXAM: CHEST - 2 VIEW COMPARISON:  Radiograph 09/15/2019 FINDINGS: No consolidation, features of edema, pneumothorax, or effusion. Pulmonary vascularity is normally distributed. The cardiomediastinal contours are unremarkable. No acute osseous or soft tissue abnormality. Cholecystectomy clips in the right upper quadrant. IMPRESSION: No acute cardiopulmonary abnormality. Electronically Signed   By: Lovena Le M.D.   On: 09/20/2019 03:58   Dg Chest Port 1 View  Result Date: 09/25/2019 CLINICAL DATA:  Shortness of breath for 2 hours EXAM: PORTABLE CHEST 1 VIEW COMPARISON:  Radiograph 09/20/2019 FINDINGS: Imaging quality degraded by patient body habitus. Increasing hazy interstitial opacities with some interval cephalization of the vascularity. Prominent cardiac silhouette may in part be due to the portable technique. No pneumothorax or effusion. No acute osseous or soft tissue abnormality. IMPRESSION: 1. Increasing hazy interstitial opacities with some interval cephalization of the vascularity, suspicious for interstitial edema. No pleural effusion. 2. Imaging quality degraded by patient body habitus. Electronically Signed   By: Lovena Le M.D.   On: 09/25/2019 16:12     CBC Recent Labs  Lab 09/25/19  1554 09/26/19 2349 09/28/19 0515  WBC 7.8 8.7 12.8*  HGB 16.4* 16.4* 15.6*  HCT 48.7* 51.3* 46.8*  PLT 283 268 322  MCV 86.0 90.2 87.3  MCH 29.0 28.8 29.1  MCHC 33.7 32.0 33.3  RDW 13.1 13.3 13.5  LYMPHSABS 1.7 0.8  --   MONOABS 0.4 0.3  --   EOSABS 0.3 0.0  --   BASOSABS 0.1 0.1  --     Chemistries  Recent Labs  Lab 09/25/19 1554 09/26/19 2349 09/27/19 0131 09/27/19 0526 09/27/19 1025 09/27/19 1446 09/27/19 1758 09/28/19 0515  NA 135 136  --  139 141 142 142 145  K 2.6* 4.2  --  3.8 3.3* 2.9* 3.2* 3.4*  CL 95* 93*  --  99 101 103 104 109  CO2 26 19*  --  16* 25 28 28 25   GLUCOSE 219* 741*  --  666* 350* 208* 139* 72  BUN 8 17  --  21* 17 14 13  10  CREATININE 0.89 1.33*  --  1.41* 1.17* 0.93 0.89 0.87  CALCIUM 8.8* 9.3  --  8.9 9.3 9.1 9.1 9.1  MG 1.7  --  2.1  --   --   --   --  2.0  AST  --  30  --   --   --   --   --   --   ALT  --  22  --   --   --   --   --   --   ALKPHOS  --  108  --   --   --   --   --   --   BILITOT  --  2.1*  --   --   --   --   --   --    ------------------------------------------------------------------------------------------------------------------ No results for input(s): CHOL, HDL, LDLCALC, TRIG, CHOLHDL, LDLDIRECT in the last 72 hours.  Lab Results  Component Value Date   HGBA1C (H) 09/02/2008    10.0 (NOTE)   The ADA recommends the following therapeutic goal for glycemic   control related to Hgb A1C measurement:   Goal of Therapy:   < 7.0% Hgb A1C   Reference: American Diabetes Association: Clinical Practice   Recommendations 2008, Diabetes Care,  2008, 31:(Suppl 1).   ------------------------------------------------------------------------------------------------------------------ No results for input(s): TSH, T4TOTAL, T3FREE, THYROIDAB in the last 72 hours.  Invalid input(s): FREET3 ------------------------------------------------------------------------------------------------------------------ No results for input(s):  VITAMINB12, FOLATE, FERRITIN, TIBC, IRON, RETICCTPCT in the last 72 hours.  Coagulation profile No results for input(s): INR, PROTIME in the last 168 hours.  No results for input(s): DDIMER in the last 72 hours.  Cardiac Enzymes No results for input(s): CKMB, TROPONINI, MYOGLOBIN in the last 168 hours.  Invalid input(s): CK ------------------------------------------------------------------------------------------------------------------    Component Value Date/Time   BNP 42.0 09/27/2019 0526     Roxan Hockey M.D on 09/28/2019 at 5:21 PM  Go to [email or web address] - for contact info  [facility] - Office  [phone]

## 2019-09-29 DIAGNOSIS — J449 Chronic obstructive pulmonary disease, unspecified: Secondary | ICD-10-CM

## 2019-09-29 LAB — GLUCOSE, CAPILLARY
Glucose-Capillary: 267 mg/dL — ABNORMAL HIGH (ref 70–99)
Glucose-Capillary: 242 mg/dL — ABNORMAL HIGH (ref 70–99)
Glucose-Capillary: 75 mg/dL (ref 70–99)

## 2019-09-29 MED ORDER — DEXTROSE 50 % IV SOLN
INTRAVENOUS | Status: AC
  Administered 2019-09-30: 12.5 g
  Filled 2019-09-29: qty 50

## 2019-09-29 NOTE — Progress Notes (Signed)
Patient Demographics:    [name], is a 58 y.o. female, DOB - 1961-09-08, [id number]  Admit date - 09/26/2019   Admitting Physician Iyannah Blake Denton Brick, MD  Outpatient Primary MD for the patient is [name], [name], NP  LOS - 2   Chief Complaint  Patient presents with  . Medical Clearance        Subjective:    [name] today has no fevers, no emesis,  - -RN and nursing students at bedside -Patient at times will answer questions appropriately  Assessment  & Plan :    Principal Problem:   DKA (diabetic ketoacidosis) ([place]) Active Problems:   Hypertension   COPD (chronic obstructive pulmonary disease) ([place])   Prolonged QT interval   AKI (acute kidney injury) ([place])   Brief Summary -58 y.o. female with medical history significant of asthma, COPD, depression, type 2 diabetes, HTN,  prolonged QT interval, morbid obesity and history of recurrent psychotic episodes admitted on 09/26/2019 with DKA and psychosis -Telemetry psychiatry stated that patient does not need inpatient psych management at this time --  A/p 1)DKA/uncontrolled diabetes-- bicarb is normalized, anion gap is closed, transitioned off IV insulin,  A1C is 10-reflecting uncontrolled diabetes C/n  subcu insulin with 70/30 insulin at 80 units twice daily, PTA patient was on 60 units twice daily, -Additional sliding scale coverage -Patient sees endocrinologist at [place]  2)Depression and Anxiety/Psychosis --- patient evaluated by psychiatrist Dr. Hampton Abbot on 09/26/2019, states no need for inpatient psychiatric care at this time,  continue Zoloft 25 mg daily -Patient previously treated at [facility] inpatient psychiatric behavioral unit from March through April 2020 for psychosis. -Patient having hallucinations and psychosis from time to time- -limited with ability to use antipsychotics due to  prolonged QT -Continue BuSpar, may use iv lorazepam as needed  3)Generalized weakness and deconditioning / disposition -discussed with patient's husband, he weighs 140 pounds, patient weighs about 300 pounds and is having difficulties with mobility related ADLs -Husband states he is on disability on unable to physically provide the Patient needs at this time - physical therapy evaluation appreciated,  recommended SNF rehab  4)HTN--uncontrolled, continue amlodipine 10 mg daily, c/n  Coreg 6.25 mg twice daily, c/n  Imdur and hydralazine -IV labetalol as needed elevated BP  5)AKI--creatinine is down to 1.1 from 1.41 after hydration, baseline creatinine around 0.9   Disposition/Need for in-Hospital Stay- patient unable to be discharged at this time due to -inability to perform ADLs, unsafe discharge plan.  Unable to return to preadmission environment  Code Status : Full  Family Communication:   Discussed with pt's husband at  -Mr. [name]-- at [phone]  Disposition Plan  :  SNF rehab  Consults  :  Psych  DVT Prophylaxis  :  Lovenox -   Lab Results  Component Value Date   PLT 322 09/28/2019    Inpatient Medications  Scheduled Meds: . amLODipine  10 mg Oral Daily  . busPIRone  5 mg Oral TID  . carvedilol  6.25 mg Oral BID WC  . enoxaparin (LOVENOX) injection  70 mg Subcutaneous Q24H  . famotidine  20 mg Oral QHS  . hydrALAZINE  25 mg Oral TID  . insulin aspart  0-20 Units Subcutaneous TID WC  .  insulin aspart  0-5 Units Subcutaneous QHS  . insulin aspart protamine- aspart  80 Units Subcutaneous BID WC  . isosorbide mononitrate  30 mg Oral Daily   Continuous Infusions: . sodium chloride 50 mL/hr at 09/28/19 2350   PRN Meds:.albuterol, LORazepam    Anti-infectives (From admission, onward)   None        Objective:   Vitals:   09/29/19 0950 09/29/19 1004 09/29/19 1010 09/29/19 1300  BP: 130/65 130/65 130/65 133/68  Pulse:  96  93  Resp:      Temp:     98.9 F (37.2 C)  TempSrc:    Oral  SpO2:    94%  Weight:      Height:        Wt Readings from Last 3 Encounters:  09/27/19 132.4 kg  09/25/19 136.1 kg  09/20/19 136.1 kg     Intake/Output Summary (Last 24 hours) at 09/29/2019 1502 Last data filed at 09/29/2019 1300 Gross per 24 hour  Intake 775.82 ml  Output 1250 ml  Net -474.18 ml     Physical Exam  Gen:- Awake Alert, morbidly obese, HEENT:- [place].AT, No sclera icterus Neck-Supple Neck,No JVD,.  Lungs-  CTAB , fa3ir symmetrical air movement CV- S1, S2 normal, regular  Abd-  +ve B.Sounds, Abd Soft, No tenderness, increased truncal adiposity    Extremity/Skin:- No  edema, pedal pulses present  Psych--intermittent episodes of confusion and disorientation with psychosis  neuro-generalized weakness no new focal deficits, no tremors   Data Review:   Micro Results No results found for this or any previous visit (from the past 240 hour(s)).  Radiology Reports Dg Chest 2 View  Result Date: 09/20/2019 CLINICAL DATA:  Shortness of breath EXAM: CHEST - 2 VIEW COMPARISON:  Radiograph 09/15/2019 FINDINGS: No consolidation, features of edema, pneumothorax, or effusion. Pulmonary vascularity is normally distributed. The cardiomediastinal contours are unremarkable. No acute osseous or soft tissue abnormality. Cholecystectomy clips in the right upper quadrant. IMPRESSION: No acute cardiopulmonary abnormality. Electronically Signed   By: Lovena Le M.D.   On: 09/20/2019 03:58   Dg Chest Port 1 View  Result Date: 09/25/2019 CLINICAL DATA:  Shortness of breath for 2 hours EXAM: PORTABLE CHEST 1 VIEW COMPARISON:  Radiograph 09/20/2019 FINDINGS: Imaging quality degraded by patient body habitus. Increasing hazy interstitial opacities with some interval cephalization of the vascularity. Prominent cardiac silhouette may in part be due to the portable technique. No pneumothorax or effusion. No acute osseous or soft tissue abnormality.  IMPRESSION: 1. Increasing hazy interstitial opacities with some interval cephalization of the vascularity, suspicious for interstitial edema. No pleural effusion. 2. Imaging quality degraded by patient body habitus. Electronically Signed   By: Lovena Le M.D.   On: 09/25/2019 16:12     CBC Recent Labs  Lab 09/25/19 1554 09/26/19 2349 09/28/19 0515  WBC 7.8 8.7 12.8*  HGB 16.4* 16.4* 15.6*  HCT 48.7* 51.3* 46.8*  PLT 283 268 322  MCV 86.0 90.2 87.3  MCH 29.0 28.8 29.1  MCHC 33.7 32.0 33.3  RDW 13.1 13.3 13.5  LYMPHSABS 1.7 0.8  --   MONOABS 0.4 0.3  --   EOSABS 0.3 0.0  --   BASOSABS 0.1 0.1  --     Chemistries  Recent Labs  Lab 09/25/19 1554 09/26/19 2349 09/27/19 0131 09/27/19 0526 09/27/19 1025 09/27/19 1446 09/27/19 1758 09/28/19 0515  NA 135 136  --  139 141 142 142 145  K 2.6* 4.2  --  3.8 3.3*  2.9* 3.2* 3.4*  CL 95* 93*  --  99 101 103 104 109  CO2 26 19*  --  16* 25 28 28 25   GLUCOSE 219* 741*  --  666* 350* 208* 139* 72  BUN 8 17  --  21* 17 14 13 10   CREATININE 0.89 1.33*  --  1.41* 1.17* 0.93 0.89 0.87  CALCIUM 8.8* 9.3  --  8.9 9.3 9.1 9.1 9.1  MG 1.7  --  2.1  --   --   --   --  2.0  AST  --  30  --   --   --   --   --   --   ALT  --  22  --   --   --   --   --   --   ALKPHOS  --  108  --   --   --   --   --   --   BILITOT  --  2.1*  --   --   --   --   --   --    ------------------------------------------------------------------------------------------------------------------ No results for input(s): CHOL, HDL, LDLCALC, TRIG, CHOLHDL, LDLDIRECT in the last 72 hours.  Lab Results  Component Value Date   HGBA1C (H) 09/02/2008    10.0 (NOTE)   The ADA recommends the following therapeutic goal for glycemic   control related to Hgb A1C measurement:   Goal of Therapy:   < 7.0% Hgb A1C   Reference: American Diabetes Association: Clinical Practice   Recommendations 2008, Diabetes Care,  2008, 31:(Suppl 1).    ------------------------------------------------------------------------------------------------------------------ No results for input(s): TSH, T4TOTAL, T3FREE, THYROIDAB in the last 72 hours.  Invalid input(s): FREET3 ------------------------------------------------------------------------------------------------------------------ No results for input(s): VITAMINB12, FOLATE, FERRITIN, TIBC, IRON, RETICCTPCT in the last 72 hours.  Coagulation profile No results for input(s): INR, PROTIME in the last 168 hours.  No results for input(s): DDIMER in the last 72 hours.  Cardiac Enzymes No results for input(s): CKMB, TROPONINI, MYOGLOBIN in the last 168 hours.  Invalid input(s): CK ------------------------------------------------------------------------------------------------------------------    Component Value Date/Time   BNP 42.0 09/27/2019 0526     Roxan Hockey M.D on 09/29/2019 at 3:02 PM  Go to [email or web address] - for contact info  [facility] - Office  [phone]

## 2019-09-29 NOTE — Plan of Care (Signed)
  Problem: Acute Rehab PT Goals(only PT should resolve) Goal: Pt Will Go Supine/Side To Sit Outcome: Progressing Flowsheets (Taken 09/29/2019 1250) Pt will go Supine/Side to Sit: with min guard assist Goal: Patient Will Transfer Sit To/From Stand Outcome: Progressing Flowsheets (Taken 09/29/2019 1250) Patient will transfer sit to/from stand: with min guard assist Goal: Pt Will Transfer Bed To Chair/Chair To Bed Outcome: Progressing Flowsheets (Taken 09/29/2019 1250) Pt will Transfer Bed to Chair/Chair to Bed: min guard assist Goal: Pt Will Ambulate Outcome: Progressing Flowsheets (Taken 09/29/2019 1250) Pt will Ambulate:  50 feet  with minimal assist  with rolling walker   12:50 PM, 09/29/19 Lonell Grandchild, MPT Physical Therapist with [facility] [phone] office [phone] mobile phone

## 2019-09-29 NOTE — Evaluation (Signed)
Physical Therapy Evaluation Patient Details Name: [name] MRN: [id number] DOB: 08/02/1961 Today's Date: 09/29/2019   History of Present Illness  [name] is a 58 y.o. female with medical history significant of asthma, COPD, depression, type 2 diabetes, systolic dysfunction, hypertension, prolonged QT interval who was brought to the emergency department on 09/26/2019 evening due to the patient having hallucinations.  She was seen for similar symptoms the previous day and was discharged home.  The patient is currently having hallucinations and unable to provide further history.  Her husband stated her earlier to the EDP, that she has been "talking crazy" home.  She has been complaining of visual and auditory hallucinations.  The case is being present to [place] now, since the patient apparently may have missed some of her insulin doses and is now on DKA.    Clinical Impression  Patient limited for functional mobility as stated below secondary to BLE weakness, fatigue and poor standing balance.  Patient limited to a few steps at bedside, incontinent of stool, tolerated standing for up to 5-6 minutes while being cleaned by nursing staff and tolerated sitting up in chair after therapy - RN aware.  Patient will benefit from continued physical therapy in hospital and recommended venue below to increase strength, balance, endurance for safe ADLs and gait.     Follow Up Recommendations SNF;Supervision for mobility/OOB;Supervision/Assistance - 24 hour    Equipment Recommendations  None recommended by PT    Recommendations for Other Services       Precautions / Restrictions Precautions Precautions: Fall Restrictions Weight Bearing Restrictions: No      Mobility  Bed Mobility Overal bed mobility: Needs Assistance Bed Mobility: Rolling;Sidelying to Sit Rolling: Min assist;Mod assist Sidelying to sit: Mod assist       General bed mobility comments: slow labored  movement  Transfers Overall transfer level: Needs assistance Equipment used: Rolling walker (2 wheeled) Transfers: Sit to/from [facility] Sit to Stand: Mod assist Stand pivot transfers: Mod assist       General transfer comment: unsteady on feet, occasional buckling of knees  Ambulation/Gait Ambulation/Gait assistance: Mod assist Gait Distance (Feet): 5 Feet Assistive device: Rolling walker (2 wheeled) Gait Pattern/deviations: Decreased step length - right;Decreased step length - left;Decreased stride length Gait velocity: decreased   General Gait Details: limited to 5-6 slow labored steps at bedside due to BLE weakness with poor standing balance  Stairs            Wheelchair Mobility    Modified Rankin (Stroke Patients Only)       Balance Overall balance assessment: Needs assistance Sitting-balance support: Feet supported;No upper extremity supported Sitting balance-Leahy Scale: Fair Sitting balance - Comments: seated at bedside   Standing balance support: During functional activity;Bilateral upper extremity supported Standing balance-Leahy Scale: Fair Standing balance comment: using RW, poor without AD                             Pertinent Vitals/Pain Pain Assessment: No/denies pain    Home Living Family/patient expects to be discharged to:: Private residence Living Arrangements: Spouse/significant other Available Help at Discharge: Family;Available PRN/intermittently Type of Home: House(split level) Home Access: Stairs to enter Entrance Stairs-Rails: None Entrance Stairs-Number of Steps: 2 Home Layout: One level Home Equipment: Walker - 2 wheels;Bedside commode;Shower seat;Cane - single point      Prior Function Level of Independence: Independent with assistive device(s)  Comments: household ambulator leaning on walls, furniture     Hand Dominance        Extremity/Trunk Assessment   Upper Extremity  Assessment Upper Extremity Assessment: Generalized weakness    Lower Extremity Assessment Lower Extremity Assessment: Generalized weakness    Cervical / Trunk Assessment Cervical / Trunk Assessment: Normal  Communication   Communication: Other (comment)(Patient appears confused)  Cognition Arousal/Alertness: Awake/alert Behavior During Therapy: WFL for tasks assessed/performed Overall Cognitive Status: Impaired/Different from baseline Area of Impairment: Awareness;Problem solving;Safety/judgement                         Safety/Judgement: Decreased awareness of safety Awareness: (not orientated to place and time) Problem Solving: Slow processing        General Comments      Exercises     Assessment/Plan    PT Assessment Patient needs continued PT services  PT Problem List Decreased strength;Decreased activity tolerance;Decreased balance;Decreased mobility       PT Treatment Interventions Balance training;Gait training;Stair training;Functional mobility training;Therapeutic activities;Therapeutic exercise;Patient/family education    PT Goals (Current goals can be found in the Care Plan section)  Acute Rehab PT Goals Patient Stated Goal: return home PT Goal Formulation: With patient Time For Goal Achievement: 10/13/19 Potential to Achieve Goals: Good    Frequency Min 3X/week   Barriers to discharge        Co-evaluation               AM-PAC PT "6 Clicks" Mobility  Outcome Measure Help needed turning from your back to your side while in a flat bed without using bedrails?: A Little Help needed moving from lying on your back to sitting on the side of a flat bed without using bedrails?: A Lot Help needed moving to and from a bed to a chair (including a wheelchair)?: A Lot Help needed standing up from a chair using your arms (e.g., wheelchair or bedside chair)?: A Lot Help needed to walk in hospital room?: A Lot Help needed climbing 3-5 steps with a  railing? : Total 6 Click Score: 12    End of Session   Activity Tolerance: Patient tolerated treatment well Patient left: in chair;with call bell/phone within reach;with chair alarm set Nurse Communication: Mobility status PT Visit Diagnosis: Unsteadiness on feet (R26.81);Other abnormalities of gait and mobility (R26.89);Muscle weakness (generalized) (M62.81)    Time: [id number] PT Time Calculation (min) (ACUTE ONLY): 35 min   Charges:   PT Evaluation $PT Eval Moderate Complexity: 1 Mod PT Treatments $Therapeutic Activity: 23-37 mins        12:48 PM, 09/29/19 Lonell Grandchild, MPT Physical Therapist with [facility] [phone] office [phone] mobile phone

## 2019-09-30 LAB — GLUCOSE, CAPILLARY
Glucose-Capillary: 120 mg/dL — ABNORMAL HIGH (ref 70–99)
Glucose-Capillary: 338 mg/dL — ABNORMAL HIGH (ref 70–99)
Glucose-Capillary: 341 mg/dL — ABNORMAL HIGH (ref 70–99)
Glucose-Capillary: 36 mg/dL — CL (ref 70–99)
Glucose-Capillary: 380 mg/dL — ABNORMAL HIGH (ref 70–99)
Glucose-Capillary: 501 mg/dL (ref 70–99)
Glucose-Capillary: 84 mg/dL (ref 70–99)

## 2019-09-30 LAB — SARS CORONAVIRUS 2 BY RT PCR (HOSPITAL ORDER, PERFORMED IN ~~LOC~~ HOSPITAL LAB): SARS Coronavirus 2: NEGATIVE

## 2019-09-30 MED ORDER — INSULIN ASPART 100 UNIT/ML ~~LOC~~ SOLN
0.0000 [IU] | Freq: Every day | SUBCUTANEOUS | Status: DC
  Administered 2019-09-30: 4 [IU] via SUBCUTANEOUS

## 2019-09-30 MED ORDER — ALUM & MAG HYDROXIDE-SIMETH 200-200-20 MG/5ML PO SUSP
30.0000 mL | Freq: Four times a day (QID) | ORAL | Status: DC | PRN
  Administered 2019-09-30 – 2019-10-04 (×4): 30 mL via ORAL
  Filled 2019-09-30 (×4): qty 30

## 2019-09-30 MED ORDER — INSULIN ASPART PROT & ASPART (70-30 MIX) 100 UNIT/ML ~~LOC~~ SUSP
50.0000 [IU] | Freq: Two times a day (BID) | SUBCUTANEOUS | Status: DC
  Administered 2019-09-30 – 2019-10-03 (×6): 50 [IU] via SUBCUTANEOUS
  Filled 2019-09-30 (×2): qty 10

## 2019-09-30 MED ORDER — INSULIN ASPART 100 UNIT/ML ~~LOC~~ SOLN
0.0000 [IU] | Freq: Three times a day (TID) | SUBCUTANEOUS | Status: DC
  Administered 2019-09-30 (×2): 15 [IU] via SUBCUTANEOUS
  Administered 2019-10-01: 2 [IU] via SUBCUTANEOUS
  Administered 2019-10-01: 12:00:00 15 [IU] via SUBCUTANEOUS
  Administered 2019-10-01: 8 [IU] via SUBCUTANEOUS
  Administered 2019-10-02: 2 [IU] via SUBCUTANEOUS
  Administered 2019-10-02: 3 [IU] via SUBCUTANEOUS
  Administered 2019-10-03 (×2): 15 [IU] via SUBCUTANEOUS
  Administered 2019-10-03: 8 [IU] via SUBCUTANEOUS
  Administered 2019-10-04: 3 [IU] via SUBCUTANEOUS
  Administered 2019-10-04: 8 [IU] via SUBCUTANEOUS
  Administered 2019-10-04: 5 [IU] via SUBCUTANEOUS
  Administered 2019-10-05: 8 [IU] via SUBCUTANEOUS
  Administered 2019-10-05: 3 [IU] via SUBCUTANEOUS

## 2019-09-30 NOTE — Progress Notes (Addendum)
Patient Demographics:    [name], is a 58 y.o. female, DOB - [birth year], XN:[id number]  Admit date - 09/26/2019   Admitting Physician Teresa Fayson Denton Brick, MD  Outpatient Primary MD for the patient is [name], [name], NP  LOS - 3   Chief Complaint  Patient presents with  . Medical Clearance        Subjective:    [name] today has no fevers, no emesis,  - -resting comfortably, had episode of hypoglycemia overnight, oral intake remains anorectic  Assessment  & Plan :    Principal Problem:   DKA (diabetic ketoacidosis) ([place]) Active Problems:   Hypertension   COPD (chronic obstructive pulmonary disease) ([facility])   Prolonged QT interval   AKI (acute kidney injury) ([place])   Brief Summary -58 y.o. female with medical history significant of asthma, COPD, depression, type 2 diabetes, HTN,  prolonged QT interval, morbid obesity and history of recurrent psychotic episodes admitted on 09/26/2019 with DKA and psychosis -Telemetry psychiatry stated that patient does not need inpatient psych management at this time --  A/p 1)DKA/uncontrolled diabetes-- bicarb is normalized, anion gap is closed, transitioned off IV insulin,  -No recent A1c available, blood sugars erratic, due to inconsistent oral intake -Change 70/30 insulin to 50 units twice daily -Use Novolog/Humalog Sliding scale insulin with Accu-Cheks/Fingersticks as ordered  PTA patient was on 60 units twice daily, -Patient sees endocrinologist at [place]  2)Depression and Anxiety/Psychosis --- patient evaluated by psychiatrist [name] on 09/26/2019, states no need for inpatient psychiatric care at this time,  continue Zoloft 25 mg daily -Patient previously treated at [facility] inpatient psychiatric behavioral unit from March through April 2020 for psychosis. -Patient having hallucinations and psychosis from  time to time- -limited with ability to use antipsychotics due to prolonged QT -Continue BuSpar, may use iv lorazepam as needed  3)Generalized weakness and deconditioning / disposition -discussed with patient's husband, he weighs 140 pounds, patient weighs about 300 pounds and is having difficulties with mobility related ADLs -Husband states he is on disability on unable to physically provide the Patient needs at this time - physical therapy evaluation appreciated,  recommended SNF rehab  4)HTN--BP much improved, continue amlodipine 10 mg daily, c/n  Coreg 6.25 mg twice daily, c/n  Imdur and hydralazine -IV labetalol as needed elevated BP  5)AKI--creatinine is down to 1.1 from 1.41 after hydration, baseline creatinine around 0.9   Disposition/Need for in-Hospital Stay- patient unable to be discharged at this time due to -inability to perform ADLs, unsafe discharge plan.  Unable to return to preadmission environment-awaiting transfer to SNF rehab  Code Status : Full  Family Communication:   Discussed with pt's husband at  -[name]-- at [phone]  Disposition Plan  :  SNF rehab  Consults  :  Psych  DVT Prophylaxis  :  Lovenox -   Lab Results  Component Value Date   PLT 322 09/28/2019    Inpatient Medications  Scheduled Meds: . amLODipine  10 mg Oral Daily  . busPIRone  5 mg Oral TID  . carvedilol  6.25 mg Oral BID WC  . enoxaparin (LOVENOX) injection  70 mg Subcutaneous Q24H  . famotidine  20 mg Oral QHS  . hydrALAZINE  25  mg Oral TID  . insulin aspart  0-15 Units Subcutaneous TID WC  . insulin aspart  0-5 Units Subcutaneous QHS  . insulin aspart protamine- aspart  50 Units Subcutaneous BID WC  . isosorbide mononitrate  30 mg Oral Daily   Continuous Infusions: . sodium chloride 50 mL/hr at 09/29/19 2029   PRN Meds:.albuterol, alum & mag hydroxide-simeth, LORazepam    Anti-infectives (From admission, onward)   None        Objective:   Vitals:    09/30/19 0804 09/30/19 0805 09/30/19 0806 09/30/19 1342  BP: (!) 144/56 (!) 144/56 (!) 144/56 139/70  Pulse:  83  85  Resp:    19  Temp:    98.9 F (37.2 C)  TempSrc:    Oral  SpO2:    96%  Weight:      Height:        Wt Readings from Last 3 Encounters:  09/27/19 132.4 kg  09/25/19 136.1 kg  09/20/19 136.1 kg     Intake/Output Summary (Last 24 hours) at 09/30/2019 1413 Last data filed at 09/30/2019 0500 Gross per 24 hour  Intake 1460 ml  Output -  Net 1460 ml     Physical Exam  Gen:- Awake Alert, morbidly obese, HEENT:- [place].AT, No sclera icterus Neck-Supple Neck,No JVD,.  Lungs-  CTAB , fa3ir symmetrical air movement CV- S1, S2 normal, regular  Abd-  +ve B.Sounds, Abd Soft, No tenderness, increased truncal adiposity    Extremity/Skin:- No  edema, pedal pulses present  Psych--intermittent episodes of confusion and disorientation with psychosis  neuro-generalized weakness no new focal deficits, no tremors   Data Review:   Micro Results Recent Results (from the past 240 hour(s))  SARS Coronavirus 2 by RT PCR (hospital order, performed in [facility] hospital lab) Nasopharyngeal Nasopharyngeal Swab     Status: None   Collection Time: 09/30/19  2:30 AM   Specimen: Nasopharyngeal Swab  Result Value Ref Range Status   SARS Coronavirus 2 NEGATIVE NEGATIVE Final    Comment: (NOTE) If result is NEGATIVE SARS-CoV-2 target nucleic acids are NOT DETECTED. The SARS-CoV-2 RNA is generally detectable in upper and lower  respiratory specimens during the acute phase of infection. The lowest  concentration of SARS-CoV-2 viral copies this assay can detect is 250  copies / mL. A negative result does not preclude SARS-CoV-2 infection  and should not be used as the sole basis for treatment or other  patient management decisions.  A negative result may occur with  improper specimen collection / handling, submission of specimen other  than nasopharyngeal swab, presence of viral  mutation(s) within the  areas targeted by this assay, and inadequate number of viral copies  (<250 copies / mL). A negative result must be combined with clinical  observations, patient history, and epidemiological information. If result is POSITIVE SARS-CoV-2 target nucleic acids are DETECTED. The SARS-CoV-2 RNA is generally detectable in upper and lower  respiratory specimens dur ing the acute phase of infection.  Positive  results are indicative of active infection with SARS-CoV-2.  Clinical  correlation with patient history and other diagnostic information is  necessary to determine patient infection status.  Positive results do  not rule out bacterial infection or co-infection with other viruses. If result is PRESUMPTIVE POSTIVE SARS-CoV-2 nucleic acids MAY BE PRESENT.   A presumptive positive result was obtained on the submitted specimen  and confirmed on repeat testing.  While 2019 novel coronavirus  (SARS-CoV-2) nucleic acids may be present in  the submitted sample  additional confirmatory testing may be necessary for epidemiological  and / or clinical management purposes  to differentiate between  SARS-CoV-2 and other Sarbecovirus currently known to infect humans.  If clinically indicated additional testing with an alternate test  methodology [phone]) is advised. The SARS-CoV-2 RNA is generally  detectable in upper and lower respiratory sp ecimens during the acute  phase of infection. The expected result is Negative. Fact Sheet for Patients:  StrictlyIdeas.no Fact Sheet for Healthcare Providers: BankingDealers.co.za This test is not yet approved or cleared by the [place] FDA and has been authorized for detection and/or diagnosis of SARS-CoV-2 by FDA under an Emergency Use Authorization (EUA).  This EUA will remain in effect (meaning this test can be used) for the duration of the COVID-19 declaration under Section 564(b)(1)  of the Act, 21 U.S.C. section 360bbb-3(b)(1), unless the authorization is terminated or revoked sooner. Performed at [facility], [address] [place], [place] [address]     Radiology Reports Dg Chest 2 View  Result Date: 09/20/2019 CLINICAL DATA:  Shortness of breath EXAM: CHEST - 2 VIEW COMPARISON:  Radiograph 09/15/2019 FINDINGS: No consolidation, features of edema, pneumothorax, or effusion. Pulmonary vascularity is normally distributed. The cardiomediastinal contours are unremarkable. No acute osseous or soft tissue abnormality. Cholecystectomy clips in the right upper quadrant. IMPRESSION: No acute cardiopulmonary abnormality. Electronically Signed   By: Lovena Le M.D.   On: 09/20/2019 03:58   Dg Chest Port 1 View  Result Date: 09/25/2019 CLINICAL DATA:  Shortness of breath for 2 hours EXAM: PORTABLE CHEST 1 VIEW COMPARISON:  Radiograph 09/20/2019 FINDINGS: Imaging quality degraded by patient body habitus. Increasing hazy interstitial opacities with some interval cephalization of the vascularity. Prominent cardiac silhouette may in part be due to the portable technique. No pneumothorax or effusion. No acute osseous or soft tissue abnormality. IMPRESSION: 1. Increasing hazy interstitial opacities with some interval cephalization of the vascularity, suspicious for interstitial edema. No pleural effusion. 2. Imaging quality degraded by patient body habitus. Electronically Signed   By: Lovena Le M.D.   On: 09/25/2019 16:12     CBC Recent Labs  Lab 09/25/19 1554 09/26/19 2349 09/28/19 0515  WBC 7.8 8.7 12.8*  HGB 16.4* 16.4* 15.6*  HCT 48.7* 51.3* 46.8*  PLT 283 268 322  MCV 86.0 90.2 87.3  MCH 29.0 28.8 29.1  MCHC 33.7 32.0 33.3  RDW 13.1 13.3 13.5  LYMPHSABS 1.7 0.8  --   MONOABS 0.4 0.3  --   EOSABS 0.3 0.0  --   BASOSABS 0.1 0.1  --     Chemistries  Recent Labs  Lab 09/25/19 1554 09/26/19 2349 09/27/19 0131 09/27/19 0526 09/27/19 1025 09/27/19 1446  09/27/19 1758 09/28/19 0515  NA 135 136  --  139 141 142 142 145  K 2.6* 4.2  --  3.8 3.3* 2.9* 3.2* 3.4*  CL 95* 93*  --  99 101 103 104 109  CO2 26 19*  --  16* 25 28 28 25   GLUCOSE 219* 741*  --  666* 350* 208* 139* 72  BUN 8 17  --  21* 17 14 13 10   CREATININE 0.89 1.33*  --  1.41* 1.17* 0.93 0.89 0.87  CALCIUM 8.8* 9.3  --  8.9 9.3 9.1 9.1 9.1  MG 1.7  --  2.1  --   --   --   --  2.0  AST  --  30  --   --   --   --   --   --  ALT  --  22  --   --   --   --   --   --   ALKPHOS  --  108  --   --   --   --   --   --   BILITOT  --  2.1*  --   --   --   --   --   --    ------------------------------------------------------------------------------------------------------------------ No results for input(s): CHOL, HDL, LDLCALC, TRIG, CHOLHDL, LDLDIRECT in the last 72 hours.  Lab Results  Component Value Date   HGBA1C (H) 09/02/2008    10.0 (NOTE)   The ADA recommends the following therapeutic goal for glycemic   control related to Hgb A1C measurement:   Goal of Therapy:   < 7.0% Hgb A1C   Reference: American Diabetes Association: Clinical Practice   Recommendations 2008, Diabetes Care,  2008, 31:(Suppl 1).   ------------------------------------------------------------------------------------------------------------------ No results for input(s): TSH, T4TOTAL, T3FREE, THYROIDAB in the last 72 hours.  Invalid input(s): FREET3 ------------------------------------------------------------------------------------------------------------------ No results for input(s): VITAMINB12, FOLATE, FERRITIN, TIBC, IRON, RETICCTPCT in the last 72 hours.  Coagulation profile No results for input(s): INR, PROTIME in the last 168 hours.  No results for input(s): DDIMER in the last 72 hours.  Cardiac Enzymes No results for input(s): CKMB, TROPONINI, MYOGLOBIN in the last 168 hours.  Invalid input(s): CK  ------------------------------------------------------------------------------------------------------------------    Component Value Date/Time   BNP 42.0 09/27/2019 0526     Roxan Hockey M.D on 09/30/2019 at 2:13 PM  Go to [email or web address] - for contact info  [facility] - Office  [phone]

## 2019-09-30 NOTE — Progress Notes (Signed)
Pt stated that she felt as though her blood sugar was low. Assessed BS x's 2 with readings of 36 and 37 at 2358. Notified Dr. Maudie Mercury. 12.5 grams of dextrose given via IV. Re-checked BS at 0015 with reading of 84. Will continue to monitor.

## 2019-10-01 LAB — BASIC METABOLIC PANEL
Anion gap: 11 (ref 5–15)
BUN: 13 mg/dL (ref 6–20)
CO2: 27 mmol/L (ref 22–32)
Calcium: 8.9 mg/dL (ref 8.9–10.3)
Chloride: 104 mmol/L (ref 98–111)
Creatinine, Ser: 0.85 mg/dL (ref 0.44–1.00)
GFR calc Af Amer: >60 mL/min (ref >60)
GFR calc non Af Amer: >60 mL/min (ref >60)
Glucose, Bld: 244 mg/dL — ABNORMAL HIGH (ref 70–99)
Potassium: 3.4 mmol/L — ABNORMAL LOW (ref 3.5–5.1)
Sodium: 142 mmol/L (ref 135–145)

## 2019-10-01 LAB — CBC
HCT: 43.2 % (ref 36.0–46.0)
Hemoglobin: 14.2 g/dL (ref 12.0–15.0)
MCH: 29.2 pg (ref 26.0–34.0)
MCHC: 32.9 g/dL (ref 30.0–36.0)
MCV: 88.9 fL (ref 80.0–100.0)
Platelets: 280 10*3/uL (ref 150–400)
RBC: 4.86 MIL/uL (ref 3.87–5.11)
RDW: 13.2 % (ref 11.5–15.5)
WBC: 8.4 10*3/uL (ref 4.0–10.5)
nRBC: 0 % (ref 0.0–0.2)

## 2019-10-01 LAB — GLUCOSE, CAPILLARY
Glucose-Capillary: 262 mg/dL — ABNORMAL HIGH (ref 70–99)
Glucose-Capillary: 356 mg/dL — ABNORMAL HIGH (ref 70–99)
Glucose-Capillary: 141 mg/dL — ABNORMAL HIGH (ref 70–99)
Glucose-Capillary: 156 mg/dL — ABNORMAL HIGH (ref 70–99)

## 2019-10-01 MED ORDER — POTASSIUM CHLORIDE CRYS ER 20 MEQ PO TBCR
40.0000 meq | EXTENDED_RELEASE_TABLET | ORAL | Status: AC
  Administered 2019-10-01 (×2): 40 meq via ORAL
  Filled 2019-10-01: qty 2
  Filled 2019-10-01: qty 4

## 2019-10-01 MED ORDER — POTASSIUM CHLORIDE CRYS ER 20 MEQ PO TBCR
40.0000 meq | EXTENDED_RELEASE_TABLET | Freq: Once | ORAL | Status: DC
  Filled 2019-10-01: qty 2

## 2019-10-01 MED ORDER — CARVEDILOL 12.5 MG PO TABS
12.5000 mg | ORAL_TABLET | Freq: Two times a day (BID) | ORAL | Status: DC
  Administered 2019-10-01 – 2019-10-05 (×8): 12.5 mg via ORAL
  Filled 2019-10-01 (×8): qty 1

## 2019-10-01 MED ORDER — HYDRALAZINE HCL 25 MG PO TABS
50.0000 mg | ORAL_TABLET | Freq: Three times a day (TID) | ORAL | Status: DC
  Administered 2019-10-01 (×3): 50 mg via ORAL
  Filled 2019-10-01 (×3): qty 2

## 2019-10-01 NOTE — Progress Notes (Signed)
Inpatient Diabetes Program Recommendations  AACE/ADA: New Consensus Statement on Inpatient Glycemic Control (2015)  Target Ranges:  Prepandial:   less than 140 mg/dL      Peak postprandial:   less than 180 mg/dL (1-2 hours)      Critically ill patients:  140 - 180 mg/dL   Lab Results  Component Value Date   GLUCAP 356 (H) 10/01/2019   HGBA1C (H) 09/02/2008    10.0 (NOTE)   The ADA recommends the following therapeutic goal for glycemic   control related to Hgb A1C measurement:   Goal of Therapy:   < 7.0% Hgb A1C   Reference: American Diabetes Association: Clinical Practice   Recommendations 2008, Diabetes Care,  2008, 31:(Suppl 1).    Review of Glycemic Control  Diabetes history: DM 2 Outpatient Diabetes medications: 70/30 60 units bid Current orders for Inpatient glycemic control: 70/30 50 units bid, Novolog 0-15 units tid + hs scale  Inpatient Diabetes Program Recommendations:    Noted hypoglycemia on 70/30 80 units bid. Dose reduced to 50 units bid.  Consider increasing 70/30 dose to home dose 60 units bid.  Thanks,  Tama Headings RN, MSN, BC-ADM Inpatient Diabetes Coordinator Team Pager [phone] (8a-5p)

## 2019-10-01 NOTE — Progress Notes (Signed)
Patient Demographics:    [name], is a 58 y.o. female, DOB - [birth year], XN:[id number]  Admit date - 09/26/2019   Admitting Physician Yatzil Clippinger Denton Brick, MD  Outpatient Primary MD for the patient is [name], [name], NP  LOS - 4   Chief Complaint  Patient presents with  . Medical Clearance        Subjective:    [name] today has no fevers, no emesis,  - - Oral intake remains erratic, no new concerns -overall more coherent, more appropriate, much less confused  Assessment  & Plan :    Principal Problem:   DKA (diabetic ketoacidosis) ([place]) Active Problems:   Hypertension   COPD (chronic obstructive pulmonary disease) ([facility])   Prolonged QT interval   AKI (acute kidney injury) ([place])   Brief Summary -58 y.o. female with medical history significant of asthma, COPD, depression, type 2 diabetes, HTN,  prolonged QT interval, morbid obesity and history of recurrent psychotic episodes admitted on 09/26/2019 with DKA and psychosis -Telemetry psychiatry stated that patient does not need inpatient psych management at this time --  A/p 1)DKA/uncontrolled diabetes-- bicarb is normalized, anion gap is closed, transitioned off IV insulin,  -No recent A1c available, blood sugars erratic, due to inconsistent oral intake -c/n 70/30 insulin  50 units twice daily -Use Novolog/Humalog Sliding scale insulin with Accu-Cheks/Fingersticks as ordered  PTA patient was on 60 units twice daily, -Patient sees endocrinologist at [name]  2)Depression and Anxiety/Psychosis --- patient evaluated by psychiatrist Dr. Hampton Abbot on 09/26/2019, states no need for inpatient psychiatric care at this time,  continue Zoloft 25 mg daily -Patient previously treated at [facility] inpatient psychiatric behavioral unit from March through April 2020 for psychosis. -Patient having hallucinations and  psychosis from time to time- -limited with ability to use antipsychotics due to prolonged QT -Continue BuSpar, may use iv lorazepam as needed  3)Generalized weakness and deconditioning / disposition -discussed with patient's husband, he weighs 140 pounds, patient weighs about 300 pounds and is having difficulties with mobility related ADLs -Husband states he is on disability on unable to physically provide the Patient needs at this time - physical therapy evaluation appreciated,  recommended SNF rehab-awaiting  transfer to SNF rehab  4)HTN--BP much improved, continue amlodipine 10 mg daily, c/n  Coreg 6.25 mg twice daily, c/n  Imdur and hydralazine -IV labetalol as needed elevated BP  5)AKI--creatinine is down to 1.1 from 1.41 after hydration, baseline creatinine around 0.9   Disposition/Need for in-Hospital Stay- patient unable to be discharged at this time due to -inability to perform ADLs, unsafe discharge plan.  Unable to return to preadmission environment-awaiting transfer to SNF rehab  Code Status : Full  Family Communication:   Discussed with pt's husband at  -Mr. Edsel [name]-- at [phone]  Disposition Plan  :  SNF rehab  Consults  :  Psych  DVT Prophylaxis  :  Lovenox -   Lab Results  Component Value Date   PLT 280 10/01/2019    Inpatient Medications  Scheduled Meds: . amLODipine  10 mg Oral Daily  . busPIRone  5 mg Oral TID  . carvedilol  12.5 mg Oral BID WC  . enoxaparin (LOVENOX) injection  70 mg Subcutaneous Q24H  . famotidine  20 mg Oral QHS  . hydrALAZINE  50 mg Oral TID  . insulin aspart  0-15 Units Subcutaneous TID WC  . insulin aspart  0-5 Units Subcutaneous QHS  . insulin aspart protamine- aspart  50 Units Subcutaneous BID WC  . isosorbide mononitrate  30 mg Oral Daily  . potassium chloride  40 mEq Oral Once   Continuous Infusions: . sodium chloride 10 mL/hr at 10/01/19 1059   PRN Meds:.albuterol, alum & mag hydroxide-simeth, LORazepam     Anti-infectives (From admission, onward)   None        Objective:   Vitals:   09/30/19 2106 10/01/19 0445 10/01/19 0919 10/01/19 1437  BP: 138/61 (!) 171/69 (!) 141/74 139/66  Pulse: 87 85 86 91  Resp: 19 19 19 16   Temp: 98.6 F (37 C) 98.2 F (36.8 C) 98.2 F (36.8 C) 98.2 F (36.8 C)  TempSrc: Oral Oral Oral Oral  SpO2: 99% 96% 95% 98%  Weight:      Height:        Wt Readings from Last 3 Encounters:  09/27/19 132.4 kg  09/25/19 136.1 kg  09/20/19 136.1 kg     Intake/Output Summary (Last 24 hours) at 10/01/2019 1834 Last data filed at 10/01/2019 0400 Gross per 24 hour  Intake 1130.37 ml  Output -  Net 1130.37 ml     Physical Exam  Gen:- Awake Alert, morbidly obese, HEENT:- [place].AT, No sclera icterus Neck-Supple Neck,No JVD,.  Lungs-  CTAB , fa3ir symmetrical air movement CV- S1, S2 normal, regular  Abd-  +ve B.Sounds, Abd Soft, No tenderness, increased truncal adiposity    Extremity/Skin:- No  edema, pedal pulses present  Psych--- overall more coherent, more appropriate, much less confused neuro-generalized weakness no new focal deficits, no tremors   Data Review:   Micro Results Recent Results (from the past 240 hour(s))  SARS Coronavirus 2 by RT PCR (hospital order, performed in [facility] hospital lab) Nasopharyngeal Nasopharyngeal Swab     Status: None   Collection Time: 09/30/19  2:30 AM   Specimen: Nasopharyngeal Swab  Result Value Ref Range Status   SARS Coronavirus 2 NEGATIVE NEGATIVE Final    Comment: (NOTE) If result is NEGATIVE SARS-CoV-2 target nucleic acids are NOT DETECTED. The SARS-CoV-2 RNA is generally detectable in upper and lower  respiratory specimens during the acute phase of infection. The lowest  concentration of SARS-CoV-2 viral copies this assay can detect is 250  copies / mL. A negative result does not preclude SARS-CoV-2 infection  and should not be used as the sole basis for treatment or other  patient management  decisions.  A negative result may occur with  improper specimen collection / handling, submission of specimen other  than nasopharyngeal swab, presence of viral mutation(s) within the  areas targeted by this assay, and inadequate number of viral copies  (<250 copies / mL). A negative result must be combined with clinical  observations, patient history, and epidemiological information. If result is POSITIVE SARS-CoV-2 target nucleic acids are DETECTED. The SARS-CoV-2 RNA is generally detectable in upper and lower  respiratory specimens dur ing the acute phase of infection.  Positive  results are indicative of active infection with SARS-CoV-2.  Clinical  correlation with patient history and other diagnostic information is  necessary to determine patient infection status.  Positive results do  not rule out bacterial infection or co-infection with other viruses. If result is PRESUMPTIVE POSTIVE SARS-CoV-2 nucleic acids MAY BE PRESENT.   A presumptive positive  result was obtained on the submitted specimen  and confirmed on repeat testing.  While 2019 novel coronavirus  (SARS-CoV-2) nucleic acids may be present in the submitted sample  additional confirmatory testing may be necessary for epidemiological  and / or clinical management purposes  to differentiate between  SARS-CoV-2 and other Sarbecovirus currently known to infect humans.  If clinically indicated additional testing with an alternate test  methodology [phone]) is advised. The SARS-CoV-2 RNA is generally  detectable in upper and lower respiratory sp ecimens during the acute  phase of infection. The expected result is Negative. Fact Sheet for Patients:  StrictlyIdeas.no Fact Sheet for Healthcare Providers: BankingDealers.co.za This test is not yet approved or cleared by the [place] FDA and has been authorized for detection and/or diagnosis of SARS-CoV-2 by FDA under an  Emergency Use Authorization (EUA).  This EUA will remain in effect (meaning this test can be used) for the duration of the COVID-19 declaration under Section 564(b)(1) of the Act, 21 U.S.C. section 360bbb-3(b)(1), unless the authorization is terminated or revoked sooner. Performed at [facility], [address] [place], [place] [address]     Radiology Reports Dg Chest 2 View  Result Date: 09/20/2019 CLINICAL DATA:  Shortness of breath EXAM: CHEST - 2 VIEW COMPARISON:  Radiograph 09/15/2019 FINDINGS: No consolidation, features of edema, pneumothorax, or effusion. Pulmonary vascularity is normally distributed. The cardiomediastinal contours are unremarkable. No acute osseous or soft tissue abnormality. Cholecystectomy clips in the right upper quadrant. IMPRESSION: No acute cardiopulmonary abnormality. Electronically Signed   By: Lovena Le M.D.   On: 09/20/2019 03:58   Dg Chest Port 1 View  Result Date: 09/25/2019 CLINICAL DATA:  Shortness of breath for 2 hours EXAM: PORTABLE CHEST 1 VIEW COMPARISON:  Radiograph 09/20/2019 FINDINGS: Imaging quality degraded by patient body habitus. Increasing hazy interstitial opacities with some interval cephalization of the vascularity. Prominent cardiac silhouette may in part be due to the portable technique. No pneumothorax or effusion. No acute osseous or soft tissue abnormality. IMPRESSION: 1. Increasing hazy interstitial opacities with some interval cephalization of the vascularity, suspicious for interstitial edema. No pleural effusion. 2. Imaging quality degraded by patient body habitus. Electronically Signed   By: Lovena Le M.D.   On: 09/25/2019 16:12     CBC Recent Labs  Lab 09/25/19 1554 09/26/19 2349 09/28/19 0515 10/01/19 0627  WBC 7.8 8.7 12.8* 8.4  HGB 16.4* 16.4* 15.6* 14.2  HCT 48.7* 51.3* 46.8* 43.2  PLT 283 268 322 280  MCV 86.0 90.2 87.3 88.9  MCH 29.0 28.8 29.1 29.2  MCHC 33.7 32.0 33.3 32.9  RDW 13.1 13.3 13.5 13.2   LYMPHSABS 1.7 0.8  --   --   MONOABS 0.4 0.3  --   --   EOSABS 0.3 0.0  --   --   BASOSABS 0.1 0.1  --   --     Chemistries  Recent Labs  Lab 09/25/19 1554 09/26/19 2349 09/27/19 0131  09/27/19 1025 09/27/19 1446 09/27/19 1758 09/28/19 0515 10/01/19 0627  NA 135 136  --    < > 141 142 142 145 142  K 2.6* 4.2  --    < > 3.3* 2.9* 3.2* 3.4* 3.4*  CL 95* 93*  --    < > 101 103 104 109 104  CO2 26 19*  --    < > 25 28 28 25 27   GLUCOSE 219* 741*  --    < > 350* 208* 139* 72 244*  BUN 8 17  --    < > 17 14 13 10 13   CREATININE 0.89 1.33*  --    < > 1.17* 0.93 0.89 0.87 0.85  CALCIUM 8.8* 9.3  --    < > 9.3 9.1 9.1 9.1 8.9  MG 1.7  --  2.1  --   --   --   --  2.0  --   AST  --  30  --   --   --   --   --   --   --   ALT  --  22  --   --   --   --   --   --   --   ALKPHOS  --  108  --   --   --   --   --   --   --   BILITOT  --  2.1*  --   --   --   --   --   --   --    < > = values in this interval not displayed.   ------------------------------------------------------------------------------------------------------------------ No results for input(s): CHOL, HDL, LDLCALC, TRIG, CHOLHDL, LDLDIRECT in the last 72 hours.  Lab Results  Component Value Date   HGBA1C (H) 09/02/2008    10.0 (NOTE)   The ADA recommends the following therapeutic goal for glycemic   control related to Hgb A1C measurement:   Goal of Therapy:   < 7.0% Hgb A1C   Reference: American Diabetes Association: Clinical Practice   Recommendations 2008, Diabetes Care,  2008, 31:(Suppl 1).   ------------------------------------------------------------------------------------------------------------------ No results for input(s): TSH, T4TOTAL, T3FREE, THYROIDAB in the last 72 hours.  Invalid input(s): FREET3 ------------------------------------------------------------------------------------------------------------------ No results for input(s): VITAMINB12, FOLATE, FERRITIN, TIBC, IRON, RETICCTPCT in the last 72  hours.  Coagulation profile No results for input(s): INR, PROTIME in the last 168 hours.  No results for input(s): DDIMER in the last 72 hours.  Cardiac Enzymes No results for input(s): CKMB, TROPONINI, MYOGLOBIN in the last 168 hours.  Invalid input(s): CK ------------------------------------------------------------------------------------------------------------------    Component Value Date/Time   BNP 42.0 09/27/2019 0526     Roxan Hockey M.D on 10/01/2019 at 6:34 PM  Go to [email or web address] - for contact info  [facility] - Office  [phone]

## 2019-10-01 NOTE — NC FL2 (Signed)
[place] MEDICAID FL2 LEVEL OF CARE SCREENING TOOL     IDENTIFICATION  Patient Name: [name] Birthdate: 1961-11-08 Sex: female Admission Date (Current Location): 09/26/2019  [facility] and [place] Number:  [employer] and Address:  [place] [address] [place]      Provider Number: [id number]  Attending Physician Name and Address:  Roxan Hockey, MD  Relative Name and Phone Number:  Vanida Chavolla - husband [phone]    Current Level of Care: Hospital Recommended Level of Care: [place] Prior Approval Number:    Date Approved/Denied:   PASRR Number:    Discharge Plan: SNF    Current Diagnoses: Patient Active Problem List   Diagnosis Date Noted  . DKA (diabetic ketoacidosis) ([place]) 09/27/2019  . AKI (acute kidney injury) ([place]) 09/27/2019  . Diastolic dysfunction [id number]  . Prolonged QT interval 05/14/2015  . Palpitations 07/23/2014  . Generalized weakness 07/23/2014  . Hypokalemia 07/22/2014  . Diabetes mellitus ([place]) 07/22/2014  . Hypertension 07/22/2014  . COPD (chronic obstructive pulmonary disease) ([place]) 07/22/2014  . Depression 07/22/2014    Orientation RESPIRATION BLADDER Height & Weight     Self  Normal Continent Weight: 132.4 kg Height:  5\' 1"  (154.9 cm)  BEHAVIORAL SYMPTOMS/MOOD NEUROLOGICAL BOWEL NUTRITION STATUS      Continent Diet(carb modified)  AMBULATORY STATUS COMMUNICATION OF NEEDS Skin   Extensive Assist Verbally Skin abrasions(Right arm)                       Personal Care Assistance Level of Assistance  Bathing, Feeding, Dressing Bathing Assistance: Limited assistance Feeding assistance: Independent Dressing Assistance: Limited assistance     Functional Limitations Info  Sight, Hearing, Speech Sight Info: Adequate Hearing Info: Adequate Speech Info: Adequate    SPECIAL CARE FACTORS FREQUENCY                       Contractures  Contractures Info: Not present    Additional Factors Info  Code Status, Allergies Code Status Info: FULL Allergies Info: Benicar,codeine, sulfa, trulicity           Current Medications (10/01/2019):  This is the current hospital active medication list Current Facility-Administered Medications  Medication Dose Route Frequency Provider Last Rate Last Dose  . 0.9 %  sodium chloride infusion   Intravenous Continuous Denton Brick, Courage, MD 10 mL/hr at 10/01/19 1059    . albuterol (PROVENTIL) (2.5 MG/3ML) 0.083% nebulizer solution 3 mL  3 mL Inhalation Q6H PRN Omar Person, NP      . alum & mag hydroxide-simeth (MAALOX/MYLANTA) 200-200-20 MG/5ML suspension 30 mL  30 mL Oral Q6H PRN Denton Brick, Courage, MD   30 mL at 09/30/19 [id number]  . amLODipine (NORVASC) tablet 10 mg  10 mg Oral Daily Emokpae, Courage, MD   10 mg at 10/01/19 1054  . busPIRone (BUSPAR) tablet 5 mg  5 mg Oral TID Roxan Hockey, MD   5 mg at 10/01/19 1055  . carvedilol (COREG) tablet 12.5 mg  12.5 mg Oral BID WC Emokpae, Courage, MD      . enoxaparin (LOVENOX) injection 70 mg  70 mg Subcutaneous Q24H Reubin Milan, MD   70 mg at 10/01/19 1041  . famotidine (PEPCID) tablet 20 mg  20 mg Oral QHS Omar Person, NP   20 mg at 09/30/19 2107  . hydrALAZINE (APRESOLINE) tablet 50 mg  50 mg Oral TID Roxan Hockey, MD  50 mg at 10/01/19 1052  . insulin aspart (novoLOG) injection 0-15 Units  0-15 Units Subcutaneous TID WC Emokpae, Courage, MD   8 Units at 10/01/19 1038  . insulin aspart (novoLOG) injection 0-5 Units  0-5 Units Subcutaneous QHS Roxan Hockey, MD   4 Units at 09/30/19 2107  . insulin aspart protamine- aspart (NOVOLOG MIX 70/30) injection 50 Units  50 Units Subcutaneous BID WC Emokpae, Courage, MD   50 Units at 10/01/19 1039  . isosorbide mononitrate (IMDUR) 24 hr tablet 30 mg  30 mg Oral Daily Emokpae, Courage, MD   30 mg at 10/01/19 1054  . LORazepam (ATIVAN) injection 1 mg  1 mg Intravenous Q6H PRN  Roxan Hockey, MD   1 mg at 09/27/19 1818  . potassium chloride SA (KLOR-CON) CR tablet 40 mEq  40 mEq Oral Q3H Emokpae, Courage, MD   40 mEq at 10/01/19 1049     Discharge Medications: Please see discharge summary for a list of discharge medications.  Relevant Imaging Results:  Relevant Lab Results:   Additional Information SS# [id number]  Boneta Lucks, RN

## 2019-10-02 LAB — GLUCOSE, CAPILLARY
Glucose-Capillary: 138 mg/dL — ABNORMAL HIGH (ref 70–99)
Glucose-Capillary: 165 mg/dL — ABNORMAL HIGH (ref 70–99)
Glucose-Capillary: 92 mg/dL (ref 70–99)
Glucose-Capillary: 198 mg/dL — ABNORMAL HIGH (ref 70–99)

## 2019-10-02 MED ORDER — HYDRALAZINE HCL 25 MG PO TABS
100.0000 mg | ORAL_TABLET | Freq: Three times a day (TID) | ORAL | Status: DC
  Administered 2019-10-02 – 2019-10-05 (×10): 100 mg via ORAL
  Filled 2019-10-02 (×10): qty 4

## 2019-10-02 MED ORDER — BUSPIRONE HCL 5 MG PO TABS
10.0000 mg | ORAL_TABLET | Freq: Three times a day (TID) | ORAL | Status: DC
  Administered 2019-10-02 – 2019-10-05 (×10): 10 mg via ORAL
  Filled 2019-10-02 (×10): qty 2

## 2019-10-02 NOTE — Plan of Care (Signed)

## 2019-10-02 NOTE — Progress Notes (Signed)
Patient Demographics:    [name], is a 58 y.o. female, DOB - 02/05/1961, [id number]  Admit date - 09/26/2019   Admitting Physician Colbin Jovel Denton Brick, MD  Outpatient Primary MD for the patient is [name], [name], NP  LOS - 5   Chief Complaint  Patient presents with  . Medical Clearance        Subjective:    [name] today has no fevers, no emesis,  - - -More cooperative, -Still has occasional episodes of disorientation -Resting comfortably  Assessment  & Plan :    Principal Problem:   DKA (diabetic ketoacidosis) ([place]) Active Problems:   Hypertension   COPD (chronic obstructive pulmonary disease) ([facility])   Prolonged QT interval   AKI (acute kidney injury) ([place])   Brief Summary -58 y.o. female with medical history significant of asthma, COPD, depression, type 2 diabetes, HTN,  prolonged QT interval, morbid obesity and history of recurrent psychotic episodes admitted on 09/26/2019 with DKA and psychosis -Telemetry psychiatry stated that patient does not need inpatient psych management at this time --  A/p 1)DKA/uncontrolled diabetes-- bicarb is normalized, anion gap is closed,  off IV insulin,  -A1c 8.8 on 06/07/2019 at [place]  blood sugars erratic, due to inconsistent oral intake -c/n 70/30 insulin  50 units twice daily -Use Novolog/Humalog Sliding scale insulin with Accu-Cheks/Fingersticks as ordered  PTA patient was on 60 units twice daily, -Patient sees endocrinologist at [place]  2)Depression and Anxiety/Psychosis --- patient evaluated by psychiatrist Dr. Hampton Abbot on 09/26/2019, states no need for inpatient psychiatric care at this time,  -Patient previously treated at [facility] inpatient psychiatric behavioral unit from March through April 2020 for psychosis. -limited with ability to use antipsychotics due to prolonged QT -Continue Zoloft 25 mg  daily and BuSpar, may use iv lorazepam as needed  3)Generalized weakness and deconditioning /Disposition -discussed with patient's husband, he weighs 140 pounds, patient weighs about 300 pounds and is having difficulties with mobility related ADLs -Husband states he is on disability on unable to physically provide the Patient needs at this time - physical therapy evaluation appreciated,  recommended SNF rehab-awaiting transfer to SNF rehab  4)HTN--BP much improved, continue amlodipine 10 mg daily, c/n  Coreg 6.25 mg twice daily, c/n  Imdur and hydralazine -IV labetalol as needed elevated BP  5)AKI--creatinine is down to 0.85  from 1.41 after hydration, baseline creatinine around 0.9   Disposition/Need for in-Hospital Stay- patient unable to be discharged at this time due to -inability to perform ADLs, unsafe discharge plan.  Unable to return to preadmission environment-awaiting transfer to SNF rehab  Code Status : Full  Family Communication:   Discussed with pt's husband at  -Mr. Edsel Bitting-- at [phone]  Disposition Plan  :  SNF rehab  Consults  :   Psych  DVT Prophylaxis  :  Lovenox -   Lab Results  Component Value Date   PLT 280 10/01/2019    Inpatient Medications  Scheduled Meds: . amLODipine  10 mg Oral Daily  . busPIRone  10 mg Oral TID  . carvedilol  12.5 mg Oral BID WC  . enoxaparin (LOVENOX) injection  70 mg Subcutaneous Q24H  . famotidine  20 mg Oral QHS  . hydrALAZINE  100 mg  Oral TID  . insulin aspart  0-15 Units Subcutaneous TID WC  . insulin aspart  0-5 Units Subcutaneous QHS  . insulin aspart protamine- aspart  50 Units Subcutaneous BID WC  . isosorbide mononitrate  30 mg Oral Daily  . potassium chloride  40 mEq Oral Once   Continuous Infusions: . sodium chloride 10 mL/hr at 10/01/19 1059   PRN Meds:.albuterol, alum & mag hydroxide-simeth, LORazepam    Anti-infectives (From admission, onward)   None        Objective:   Vitals:    10/01/19 0445 10/01/19 0919 10/01/19 1437 10/01/19 2140  BP: (!) 171/69 (!) 141/74 139/66 (!) 154/76  Pulse: 85 86 91 85  Resp: 19 19 16 20   Temp: 98.2 F (36.8 C) 98.2 F (36.8 C) 98.2 F (36.8 C) 98.2 F (36.8 C)  TempSrc: Oral Oral Oral Oral  SpO2: 96% 95% 98% 97%  Weight:      Height:        Wt Readings from Last 3 Encounters:  09/27/19 132.4 kg  09/25/19 136.1 kg  09/20/19 136.1 kg     Intake/Output Summary (Last 24 hours) at 10/02/2019 1118 Last data filed at 10/01/2019 1900 Gross per 24 hour  Intake -  Output 900 ml  Net -900 ml     Physical Exam  Gen:- Awake Alert, morbidly obese, HEENT:- [place].AT, No sclera icterus Neck-Supple Neck,No JVD,.  Lungs-  CTAB , fa3ir symmetrical air movement CV- S1, S2 normal, regular  Abd-  +ve B.Sounds, Abd Soft, No tenderness, increased truncal adiposity    Extremity/Skin:- No  edema, pedal pulses present  Psych--- overall more coherent, more appropriate, much less confused neuro-generalized weakness no new focal deficits, no tremors   Data Review:   Micro Results Recent Results (from the past 240 hour(s))  SARS Coronavirus 2 by RT PCR (hospital order, performed in [facility] hospital lab) Nasopharyngeal Nasopharyngeal Swab     Status: None   Collection Time: 09/30/19  2:30 AM   Specimen: Nasopharyngeal Swab  Result Value Ref Range Status   SARS Coronavirus 2 NEGATIVE NEGATIVE Final    Comment: (NOTE) If result is NEGATIVE SARS-CoV-2 target nucleic acids are NOT DETECTED. The SARS-CoV-2 RNA is generally detectable in upper and lower  respiratory specimens during the acute phase of infection. The lowest  concentration of SARS-CoV-2 viral copies this assay can detect is 250  copies / mL. A negative result does not preclude SARS-CoV-2 infection  and should not be used as the sole basis for treatment or other  patient management decisions.  A negative result may occur with  improper specimen collection / handling,  submission of specimen other  than nasopharyngeal swab, presence of viral mutation(s) within the  areas targeted by this assay, and inadequate number of viral copies  (<250 copies / mL). A negative result must be combined with clinical  observations, patient history, and epidemiological information. If result is POSITIVE SARS-CoV-2 target nucleic acids are DETECTED. The SARS-CoV-2 RNA is generally detectable in upper and lower  respiratory specimens dur ing the acute phase of infection.  Positive  results are indicative of active infection with SARS-CoV-2.  Clinical  correlation with patient history and other diagnostic information is  necessary to determine patient infection status.  Positive results do  not rule out bacterial infection or co-infection with other viruses. If result is PRESUMPTIVE POSTIVE SARS-CoV-2 nucleic acids MAY BE PRESENT.   A presumptive positive result was obtained on the submitted specimen  and  confirmed on repeat testing.  While 2019 novel coronavirus  (SARS-CoV-2) nucleic acids may be present in the submitted sample  additional confirmatory testing may be necessary for epidemiological  and / or clinical management purposes  to differentiate between  SARS-CoV-2 and other Sarbecovirus currently known to infect humans.  If clinically indicated additional testing with an alternate test  methodology [phone]) is advised. The SARS-CoV-2 RNA is generally  detectable in upper and lower respiratory sp ecimens during the acute  phase of infection. The expected result is Negative. Fact Sheet for Patients:  StrictlyIdeas.no Fact Sheet for Healthcare Providers: BankingDealers.co.za This test is not yet approved or cleared by the [place] FDA and has been authorized for detection and/or diagnosis of SARS-CoV-2 by FDA under an Emergency Use Authorization (EUA).  This EUA will remain in effect (meaning this test can be  used) for the duration of the COVID-19 declaration under Section 564(b)(1) of the Act, 21 U.S.C. section 360bbb-3(b)(1), unless the authorization is terminated or revoked sooner. Performed at [facility], [address] [place], [place] [address]     Radiology Reports Dg Chest 2 View  Result Date: 09/20/2019 CLINICAL DATA:  Shortness of breath EXAM: CHEST - 2 VIEW COMPARISON:  Radiograph 09/15/2019 FINDINGS: No consolidation, features of edema, pneumothorax, or effusion. Pulmonary vascularity is normally distributed. The cardiomediastinal contours are unremarkable. No acute osseous or soft tissue abnormality. Cholecystectomy clips in the right upper quadrant. IMPRESSION: No acute cardiopulmonary abnormality. Electronically Signed   By: Lovena Le M.D.   On: 09/20/2019 03:58   Dg Chest Port 1 View  Result Date: 09/25/2019 CLINICAL DATA:  Shortness of breath for 2 hours EXAM: PORTABLE CHEST 1 VIEW COMPARISON:  Radiograph 09/20/2019 FINDINGS: Imaging quality degraded by patient body habitus. Increasing hazy interstitial opacities with some interval cephalization of the vascularity. Prominent cardiac silhouette may in part be due to the portable technique. No pneumothorax or effusion. No acute osseous or soft tissue abnormality. IMPRESSION: 1. Increasing hazy interstitial opacities with some interval cephalization of the vascularity, suspicious for interstitial edema. No pleural effusion. 2. Imaging quality degraded by patient body habitus. Electronically Signed   By: Lovena Le M.D.   On: 09/25/2019 16:12     CBC Recent Labs  Lab 09/25/19 1554 09/26/19 2349 09/28/19 0515 10/01/19 0627  WBC 7.8 8.7 12.8* 8.4  HGB 16.4* 16.4* 15.6* 14.2  HCT 48.7* 51.3* 46.8* 43.2  PLT 283 268 322 280  MCV 86.0 90.2 87.3 88.9  MCH 29.0 28.8 29.1 29.2  MCHC 33.7 32.0 33.3 32.9  RDW 13.1 13.3 13.5 13.2  LYMPHSABS 1.7 0.8  --   --   MONOABS 0.4 0.3  --   --   EOSABS 0.3 0.0  --   --   BASOSABS 0.1  0.1  --   --     Chemistries  Recent Labs  Lab 09/25/19 1554 09/26/19 2349 09/27/19 0131  09/27/19 1025 09/27/19 1446 09/27/19 1758 09/28/19 0515 10/01/19 0627  NA 135 136  --    < > 141 142 142 145 142  K 2.6* 4.2  --    < > 3.3* 2.9* 3.2* 3.4* 3.4*  CL 95* 93*  --    < > 101 103 104 109 104  CO2 26 19*  --    < > 25 28 28 25 27   GLUCOSE 219* 741*  --    < > 350* 208* 139* 72 244*  BUN 8 17  --    < >  17 14 13 10 13   CREATININE 0.89 1.33*  --    < > 1.17* 0.93 0.89 0.87 0.85  CALCIUM 8.8* 9.3  --    < > 9.3 9.1 9.1 9.1 8.9  MG 1.7  --  2.1  --   --   --   --  2.0  --   AST  --  30  --   --   --   --   --   --   --   ALT  --  22  --   --   --   --   --   --   --   ALKPHOS  --  108  --   --   --   --   --   --   --   BILITOT  --  2.1*  --   --   --   --   --   --   --    < > = values in this interval not displayed.   ------------------------------------------------------------------------------------------------------------------ No results for input(s): CHOL, HDL, LDLCALC, TRIG, CHOLHDL, LDLDIRECT in the last 72 hours.  Lab Results  Component Value Date   HGBA1C (H) 09/02/2008    10.0 (NOTE)   The ADA recommends the following therapeutic goal for glycemic   control related to Hgb A1C measurement:   Goal of Therapy:   < 7.0% Hgb A1C   Reference: American Diabetes Association: Clinical Practice   Recommendations 2008, Diabetes Care,  2008, 31:(Suppl 1).   ------------------------------------------------------------------------------------------------------------------ No results for input(s): TSH, T4TOTAL, T3FREE, THYROIDAB in the last 72 hours.  Invalid input(s): FREET3 ------------------------------------------------------------------------------------------------------------------ No results for input(s): VITAMINB12, FOLATE, FERRITIN, TIBC, IRON, RETICCTPCT in the last 72 hours.  Coagulation profile No results for input(s): INR, PROTIME in the last 168 hours.  No  results for input(s): DDIMER in the last 72 hours.  Cardiac Enzymes No results for input(s): CKMB, TROPONINI, MYOGLOBIN in the last 168 hours.  Invalid input(s): CK ------------------------------------------------------------------------------------------------------------------    Component Value Date/Time   BNP 42.0 09/27/2019 0526   Roxan Hockey M.D on 10/02/2019 at 11:18 AM  Go to [email or web address] - for contact info  [facility] - Office  [phone]

## 2019-10-03 LAB — GLUCOSE, CAPILLARY
Glucose-Capillary: 468 mg/dL — ABNORMAL HIGH (ref 70–99)
Glucose-Capillary: 434 mg/dL — ABNORMAL HIGH (ref 70–99)
Glucose-Capillary: 310 mg/dL — ABNORMAL HIGH (ref 70–99)
Glucose-Capillary: 37 mg/dL — CL (ref 70–99)
Glucose-Capillary: 264 mg/dL — ABNORMAL HIGH (ref 70–99)
Glucose-Capillary: 177 mg/dL — ABNORMAL HIGH (ref 70–99)

## 2019-10-03 MED ORDER — INSULIN ASPART 100 UNIT/ML ~~LOC~~ SOLN
12.0000 [IU] | Freq: Once | SUBCUTANEOUS | Status: AC
  Administered 2019-10-03: 12 [IU] via SUBCUTANEOUS

## 2019-10-03 MED ORDER — INSULIN ASPART PROT & ASPART (70-30 MIX) 100 UNIT/ML ~~LOC~~ SUSP
56.0000 [IU] | Freq: Two times a day (BID) | SUBCUTANEOUS | Status: DC
  Administered 2019-10-03 – 2019-10-05 (×4): 56 [IU] via SUBCUTANEOUS
  Filled 2019-10-03: qty 10

## 2019-10-03 NOTE — Progress Notes (Addendum)
Inpatient Diabetes Program Recommendations  AACE/ADA: New Consensus Statement on Inpatient Glycemic Control (2015)  Target Ranges:  Prepandial:   less than 140 mg/dL      Peak postprandial:   less than 180 mg/dL (1-2 hours)      Critically ill patients:  140 - 180 mg/dL   Lab Results  Component Value Date   GLUCAP 434 (H) 10/03/2019   HGBA1C (H) 09/02/2008    10.0 (NOTE)   The ADA recommends the following therapeutic goal for glycemic   control related to Hgb A1C measurement:   Goal of Therapy:   < 7.0% Hgb A1C   Reference: American Diabetes Association: Clinical Practice   Recommendations 2008, Diabetes Care,  2008, 31:(Suppl 1).    Review of Glycemic Control Results for SHAMONI, MEARS (MRN [id number]) as of 10/03/2019 13:02  Ref. Range 10/02/2019 16:55 10/02/2019 20:53 10/03/2019 07:30 10/03/2019 11:05  Glucose-Capillary Latest Ref Range: 70 - 99 mg/dL 92 198 (H) 468 (H) 434 (H)   Diabetes history: DM 2 Outpatient Diabetes medications: 70/30 60 units bid Current orders for Inpatient glycemic control: 70/30 50 units bid, Novolog 0-15 units tid + hs scale Novolog 12 units x 1  Inpatient Diabetes Program Recommendations:    Noted significant increases to glucose trends this AM of >400s mg/dL.  Per RN, patient had consumed egg mcmuffin, peaches, yogurt, and hash browns. Not reflective in charting.  Spoke with patient regarding DM management. Last saw Dr Hartford Poli on 09/12/2019. During this visit they lower 70/30 from 70 units BID to 60 units BID because patient was having lows.  Difficulty in obtaining information from patient. Confirmed breakfast carbohydrates this AM. Per Dr Shirlyn Goltz notes patient has gained >20lbs recently and struggles with dietary indiscretion.  Reports checking CBGs with meter at home 3 times per day, although patient cannot remember her usual trends. Cannot remember if she has experienced lows on new dosage since seeing Dr Hartford Poli.  Spoke with [name], patient's  husband, regarding diabetes. He states, "Yeah her blood sugars will just go from low to high." He gives injections. However, cannot remember glucose trends at home, nor dosages. When asked specifically about doses prior to admission, he cannot remember. Also, he reports her glucose will be elevated and patient will not have eaten anything.  We discussed patho of DM, DKA, process of liver releasing additional glucose, production of ketones, vascular changes and commorbidities.  However, difficult to determine if issues are related to dietary indiscretion versus missed doses of insulin.   Secure chat sent to MD. Patient could benefit from GLP, as this could also help with weight. Cost, however, maybe an issue. Also, if to remain inpatient given difficult disposition and erratic carbohydrate intake, patient would benefit from basal/bolus. -Levemir 35 units BID -Novolog 6 units TID (assuming patient is consuming >50% of meal).   Thanks, Bronson Curb, MSN, RNC-OB Diabetes Coordinator [phone] (8a-5p)

## 2019-10-03 NOTE — TOC Progression Note (Signed)
Transition of Care [facility]) - Progression Note    Patient Details  Name: [name] MRN: [id number] Date of Birth: [birth year]  [facility]) CM/SW Contact  Ihor Gully, LCSW Phone Number: 10/03/2019, 3:57 PM  Clinical Narrative:    TOC participated in [place] interview at beside with patient and spouse. Patient has no bed offers at this time.        Expected Discharge Plan and Services                                                 Social Determinants of Health (SDOH) Interventions    Readmission Risk Interventions No flowsheet data found.

## 2019-10-03 NOTE — Progress Notes (Signed)
Patient Demographics:    [name], is a 58 y.o. female, DOB - 04-Jun-1961, [id number]  Admit date - 09/26/2019   Admitting Physician Lilyanah Celestin Denton Brick, MD  Outpatient Primary MD for the patient is [name], [name], NP  LOS - 6   Chief Complaint  Patient presents with  . Medical Clearance        Subjective:    [name] today has no fevers, no emesis,  - - -Blood sugars have been erratic due to inconsistent oral intake, -Episodes of confusion persist but overall more cooperative   Assessment  & Plan :    Principal Problem:   DKA (diabetic ketoacidosis) ([place]) Active Problems:   Hypertension   COPD (chronic obstructive pulmonary disease) ([facility])   Prolonged QT interval   AKI (acute kidney injury) ([place])   Brief Summary -58 y.o. female with medical history significant of asthma, COPD, depression, type 2 diabetes, HTN,  prolonged QT interval, morbid obesity and history of recurrent psychotic episodes admitted on 09/26/2019 with DKA and psychosis -Telemetry psychiatry stated that patient does not need inpatient psych management at this time --  A/p 1)DKA/uncontrolled diabetes with hyperglycemia-- bicarb is normalized, anion gap is closed,  off IV insulin,  -A1c 8.8 on 06/07/2019 at [place]  blood sugars erratic, due to inconsistent oral intake -Increase 70/30 insulin to 56 units twice daily -Use Novolog/Humalog Sliding scale insulin with Accu-Cheks/Fingersticks as ordered  PTA patient was on 60 units twice daily, -Patient sees endocrinologist at [place] -Diabetic educator input noted  2)Depression and Anxiety/Psychosis --- patient evaluated by psychiatrist Dr. Hampton Abbot on 09/26/2019, states no need for inpatient psychiatric care at this time,  -Patient previously treated at [facility] inpatient psychiatric behavioral unit from March through April 2020 for  psychosis. -limited with ability to use antipsychotics due to prolonged QT -Continue Zoloft 25 mg daily and BuSpar, may use iv lorazepam as needed -Intermittent Episodes of confusion persist but overall more cooperative  3)Generalized weakness and deconditioning /Disposition -discussed with patient's husband, he weighs 140 pounds, patient weighs about 300 pounds and is having difficulties with mobility related ADLs -Husband states he is on disability on unable to physically provide the Patient needs at this time - physical therapy evaluation appreciated,  recommended SNF rehab-awaiting PASSR prior to transfer to SNF rehab  4)HTN--BP much improved, continue amlodipine 10 mg daily, c/n  Coreg 6.25 mg twice daily, c/n  Imdur and hydralazine -IV labetalol as needed elevated BP  5)AKI--creatinine is down to 0.85  from 1.41 after hydration, baseline creatinine around 0.9   Disposition/Need for in-Hospital Stay- patient unable to be discharged at this time due to -inability to perform ADLs, unsafe discharge plan.  Unable to return to preadmission environment-awaiting transfer to SNF rehab -physical therapy evaluation appreciated,  recommended SNF rehab-awaiting PASSR prior to transfer to SNF rehab   Code Status : Full  Family Communication:   Discussed with pt's husband at  -Mr. Edsel Brazil-- at [phone]  Disposition Plan  :  SNF rehab  Consults  :   Psych  DVT Prophylaxis  :  Lovenox -   Lab Results  Component Value Date   PLT 280 10/01/2019    Inpatient Medications  Scheduled Meds: . amLODipine  10 mg  Oral Daily  . busPIRone  10 mg Oral TID  . carvedilol  12.5 mg Oral BID WC  . enoxaparin (LOVENOX) injection  70 mg Subcutaneous Q24H  . famotidine  20 mg Oral QHS  . hydrALAZINE  100 mg Oral TID  . insulin aspart  0-15 Units Subcutaneous TID WC  . insulin aspart  0-5 Units Subcutaneous QHS  . insulin aspart protamine- aspart  56 Units Subcutaneous BID WC  .  isosorbide mononitrate  30 mg Oral Daily  . potassium chloride  40 mEq Oral Once   Continuous Infusions: . sodium chloride 10 mL/hr at 10/01/19 1059   PRN Meds:.albuterol, alum & mag hydroxide-simeth, LORazepam    Anti-infectives (From admission, onward)   None        Objective:   Vitals:   10/03/19 0534 10/03/19 0800 10/03/19 1256 10/03/19 1622  BP: (!) 141/72 (!) 167/74 (!) 122/51 (!) 146/64  Pulse: 98 (!) 101 92 90  Resp: 18   15  Temp: 98.2 F (36.8 C) 98.6 F (37 C) 98.5 F (36.9 C) 98.6 F (37 C)  TempSrc:  Oral Oral Oral  SpO2: 94% 95% 93% 94%  Weight:      Height:        Wt Readings from Last 3 Encounters:  09/27/19 132.4 kg  09/25/19 136.1 kg  09/20/19 136.1 kg     Intake/Output Summary (Last 24 hours) at 10/03/2019 1643 Last data filed at 10/03/2019 1300 Gross per 24 hour  Intake 720 ml  Output 400 ml  Net 320 ml     Physical Exam  Gen:- Awake Alert, morbidly obese, HEENT:- [place].AT, No sclera icterus Neck-Supple Neck,No JVD,.  Lungs-  CTAB , fair symmetrical air movement CV- S1, S2 normal, regular  Abd-  +ve B.Sounds, Abd Soft, No tenderness, increased truncal adiposity    Extremity/Skin:- No  edema, pedal pulses present  Psych--- overall more coherent, more appropriate, much less confused neuro-generalized weakness no new focal deficits, no tremors   Data Review:   Micro Results Recent Results (from the past 240 hour(s))  SARS Coronavirus 2 by RT PCR (hospital order, performed in [facility] hospital lab) Nasopharyngeal Nasopharyngeal Swab     Status: None   Collection Time: 09/30/19  2:30 AM   Specimen: Nasopharyngeal Swab  Result Value Ref Range Status   SARS Coronavirus 2 NEGATIVE NEGATIVE Final    Comment: (NOTE) If result is NEGATIVE SARS-CoV-2 target nucleic acids are NOT DETECTED. The SARS-CoV-2 RNA is generally detectable in upper and lower  respiratory specimens during the acute phase of infection. The lowest  concentration  of SARS-CoV-2 viral copies this assay can detect is 250  copies / mL. A negative result does not preclude SARS-CoV-2 infection  and should not be used as the sole basis for treatment or other  patient management decisions.  A negative result may occur with  improper specimen collection / handling, submission of specimen other  than nasopharyngeal swab, presence of viral mutation(s) within the  areas targeted by this assay, and inadequate number of viral copies  (<250 copies / mL). A negative result must be combined with clinical  observations, patient history, and epidemiological information. If result is POSITIVE SARS-CoV-2 target nucleic acids are DETECTED. The SARS-CoV-2 RNA is generally detectable in upper and lower  respiratory specimens dur ing the acute phase of infection.  Positive  results are indicative of active infection with SARS-CoV-2.  Clinical  correlation with patient history and other diagnostic information is  necessary to determine patient infection status.  Positive results do  not rule out bacterial infection or co-infection with other viruses. If result is PRESUMPTIVE POSTIVE SARS-CoV-2 nucleic acids MAY BE PRESENT.   A presumptive positive result was obtained on the submitted specimen  and confirmed on repeat testing.  While 2019 novel coronavirus  (SARS-CoV-2) nucleic acids may be present in the submitted sample  additional confirmatory testing may be necessary for epidemiological  and / or clinical management purposes  to differentiate between  SARS-CoV-2 and other Sarbecovirus currently known to infect humans.  If clinically indicated additional testing with an alternate test  methodology [id number]) is advised. The SARS-CoV-2 RNA is generally  detectable in upper and lower respiratory sp ecimens during the acute  phase of infection. The expected result is Negative. Fact Sheet for Patients:  StrictlyIdeas.no Fact Sheet for Healthcare  Providers: BankingDealers.co.za This test is not yet approved or cleared by the [place] FDA and has been authorized for detection and/or diagnosis of SARS-CoV-2 by FDA under an Emergency Use Authorization (EUA).  This EUA will remain in effect (meaning this test can be used) for the duration of the COVID-19 declaration under Section 564(b)(1) of the Act, 21 U.S.C. section 360bbb-3(b)(1), unless the authorization is terminated or revoked sooner. Performed at [facility], [address] [place], [place] [id number]     Radiology Reports Dg Chest 2 View  Result Date: 09/20/2019 CLINICAL DATA:  Shortness of breath EXAM: CHEST - 2 VIEW COMPARISON:  Radiograph 09/15/2019 FINDINGS: No consolidation, features of edema, pneumothorax, or effusion. Pulmonary vascularity is normally distributed. The cardiomediastinal contours are unremarkable. No acute osseous or soft tissue abnormality. Cholecystectomy clips in the right upper quadrant. IMPRESSION: No acute cardiopulmonary abnormality. Electronically Signed   By: Lovena Le M.D.   On: 09/20/2019 03:58   Dg Chest Port 1 View  Result Date: 09/25/2019 CLINICAL DATA:  Shortness of breath for 2 hours EXAM: PORTABLE CHEST 1 VIEW COMPARISON:  Radiograph 09/20/2019 FINDINGS: Imaging quality degraded by patient body habitus. Increasing hazy interstitial opacities with some interval cephalization of the vascularity. Prominent cardiac silhouette may in part be due to the portable technique. No pneumothorax or effusion. No acute osseous or soft tissue abnormality. IMPRESSION: 1. Increasing hazy interstitial opacities with some interval cephalization of the vascularity, suspicious for interstitial edema. No pleural effusion. 2. Imaging quality degraded by patient body habitus. Electronically Signed   By: Lovena Le M.D.   On: 09/25/2019 16:12     CBC Recent Labs  Lab 09/26/19 2349 09/28/19 0515 10/01/19 0627  WBC 8.7 12.8* 8.4   HGB 16.4* 15.6* 14.2  HCT 51.3* 46.8* 43.2  PLT 268 322 280  MCV 90.2 87.3 88.9  MCH 28.8 29.1 29.2  MCHC 32.0 33.3 32.9  RDW 13.3 13.5 13.2  LYMPHSABS 0.8  --   --   MONOABS 0.3  --   --   EOSABS 0.0  --   --   BASOSABS 0.1  --   --     Chemistries  Recent Labs  Lab 09/26/19 2349 09/27/19 0131  09/27/19 1025 09/27/19 1446 09/27/19 1758 09/28/19 0515 10/01/19 0627  NA 136  --    < > 141 142 142 145 142  K 4.2  --    < > 3.3* 2.9* 3.2* 3.4* 3.4*  CL 93*  --    < > 101 103 104 109 104  CO2 19*  --    < > 25 28 28 25  27  GLUCOSE 741*  --    < > 350* 208* 139* 72 244*  BUN 17  --    < > 17 14 13 10 13   CREATININE 1.33*  --    < > 1.17* 0.93 0.89 0.87 0.85  CALCIUM 9.3  --    < > 9.3 9.1 9.1 9.1 8.9  MG  --  2.1  --   --   --   --  2.0  --   AST 30  --   --   --   --   --   --   --   ALT 22  --   --   --   --   --   --   --   ALKPHOS 108  --   --   --   --   --   --   --   BILITOT 2.1*  --   --   --   --   --   --   --    < > = values in this interval not displayed.   ------------------------------------------------------------------------------------------------------------------ No results for input(s): CHOL, HDL, LDLCALC, TRIG, CHOLHDL, LDLDIRECT in the last 72 hours.  Lab Results  Component Value Date   HGBA1C (H) 09/02/2008    10.0 (NOTE)   The ADA recommends the following therapeutic goal for glycemic   control related to Hgb A1C measurement:   Goal of Therapy:   < 7.0% Hgb A1C   Reference: American Diabetes Association: Clinical Practice   Recommendations 2008, Diabetes Care,  2008, 31:(Suppl 1).   ------------------------------------------------------------------------------------------------------------------ No results for input(s): TSH, T4TOTAL, T3FREE, THYROIDAB in the last 72 hours.  Invalid input(s): FREET3 ------------------------------------------------------------------------------------------------------------------ No results for input(s):  VITAMINB12, FOLATE, FERRITIN, TIBC, IRON, RETICCTPCT in the last 72 hours.  Coagulation profile No results for input(s): INR, PROTIME in the last 168 hours.  No results for input(s): DDIMER in the last 72 hours.  Cardiac Enzymes No results for input(s): CKMB, TROPONINI, MYOGLOBIN in the last 168 hours.  Invalid input(s): CK ------------------------------------------------------------------------------------------------------------------    Component Value Date/Time   BNP 42.0 09/27/2019 0526   Roxan Hockey M.D on 10/03/2019 at 4:43 PM  Go to [email or web address] - for contact info  [facility] - Office  [phone]

## 2019-10-03 NOTE — Progress Notes (Signed)
Glucose this morning was 468. Contacted Dr. Denton Brick and said no need for stat verification and to give 15 units novolog and the scheduled 70/30 mix.  At lunch glucose was 434 and he ordered 15 sliding scale and another 12 units to be given at 1400.

## 2019-10-04 LAB — BASIC METABOLIC PANEL
Anion gap: 13 (ref 5–15)
BUN: 11 mg/dL (ref 6–20)
CO2: 26 mmol/L (ref 22–32)
Calcium: 9.2 mg/dL (ref 8.9–10.3)
Chloride: 101 mmol/L (ref 98–111)
Creatinine, Ser: 0.95 mg/dL (ref 0.44–1.00)
GFR calc Af Amer: >60 mL/min (ref >60)
GFR calc non Af Amer: >60 mL/min (ref >60)
Glucose, Bld: 172 mg/dL — ABNORMAL HIGH (ref 70–99)
Potassium: 3.3 mmol/L — ABNORMAL LOW (ref 3.5–5.1)
Sodium: 140 mmol/L (ref 135–145)

## 2019-10-04 LAB — CBC
HCT: 46.1 % — ABNORMAL HIGH (ref 36.0–46.0)
Hemoglobin: 15 g/dL (ref 12.0–15.0)
MCH: 28.6 pg (ref 26.0–34.0)
MCHC: 32.5 g/dL (ref 30.0–36.0)
MCV: 87.8 fL (ref 80.0–100.0)
Platelets: 317 10*3/uL (ref 150–400)
RBC: 5.25 MIL/uL — ABNORMAL HIGH (ref 3.87–5.11)
RDW: 13.3 % (ref 11.5–15.5)
WBC: 8.5 10*3/uL (ref 4.0–10.5)
nRBC: 0 % (ref 0.0–0.2)

## 2019-10-04 LAB — GLUCOSE, CAPILLARY
Glucose-Capillary: 204 mg/dL — ABNORMAL HIGH (ref 70–99)
Glucose-Capillary: 274 mg/dL — ABNORMAL HIGH (ref 70–99)
Glucose-Capillary: 159 mg/dL — ABNORMAL HIGH (ref 70–99)
Glucose-Capillary: 85 mg/dL (ref 70–99)

## 2019-10-04 NOTE — TOC Progression Note (Signed)
Transition of Care [facility]) - Progression Note    Patient Details  Name: [name] MRN: [id number] Date of Birth: Jul 09, 1961  [facility] of [facility]) CM/SW Contact  Ihor Gully, LCSW Phone Number: 10/04/2019, 4:42 PM  Clinical Narrative:    Patient still has no bed offers. PASRR pending. Patient is agreeable to having clinicals sent to broader area for SNF placement. Patient is agreeable. Clinicals sent to additional facilities in [place] and [place] counties.         Expected Discharge Plan and Services                                                 Social Determinants of Health (SDOH) Interventions    Readmission Risk Interventions No flowsheet data found.

## 2019-10-04 NOTE — Progress Notes (Signed)
Physical Therapy Treatment Patient Details Name: [name] MRN: [id number] DOB: Dec 18, 1960 Today's Date: 10/04/2019    History of Present Illness [name] is a 58 y.o. female with medical history significant of asthma, COPD, depression, type 2 diabetes, systolic dysfunction, hypertension, prolonged QT interval who was brought to the emergency department on 09/26/2019 evening due to the patient having hallucinations.  She was seen for similar symptoms the previous day and was discharged home.  The patient is currently having hallucinations and unable to provide further history.  Her husband stated her earlier to the EDP, that she has been "talking crazy" home.  She has been complaining of visual and auditory hallucinations.  The case is being present to [place] now, since the patient apparently may have missed some of her insulin doses and is now on DKA.    PT Comments    Pt friendly, motivated and willing to participate with therapy.  Pt making improvements with bed mobility and transfer training, does require verbal cueing for handplacement to assist with mobility and safety with handrails for supine to sit and pushing from chair rather than pulling on RW.  Pt able to transfer to [facility] and good balance pulling up her briefs.  Increased distance with gait training though did require occasional seated rest breaks due to fatigue.  Tolerated seated LE strengthening exercises well.  EOS pt left in chair with call bell within reach and chair alarm set.     Follow Up Recommendations  SNF;Supervision for mobility/OOB;Supervision/Assistance - 24 hour     Equipment Recommendations  None recommended by PT    Recommendations for Other Services       Precautions / Restrictions Precautions Precautions: Fall Restrictions Weight Bearing Restrictions: No    Mobility  Bed Mobility Overal bed mobility: Modified Independent       Supine to sit: Min guard     General bed mobility  comments: slow labored movement, cueing for UE on handrail to assist with supine to sit  Transfers Overall transfer level: Modified independent Equipment used: Rolling walker (2 wheeled) Transfers: Sit to/from Stand Sit to Stand: Min assist         General transfer comment: able to stand to pull up brief following with good balance.  Required cueing for hand placement with sit to stand for safety  Ambulation/Gait Ambulation/Gait assistance: Min assist Gait Distance (Feet): 35 Feet Assistive device: Rolling walker (2 wheeled) Gait Pattern/deviations: Decreased step length - right;Decreased step length - left;Decreased stride length Gait velocity: decreased   General Gait Details: Vast improvements with gait.  Occasional seated rest break required per fatigue   Stairs             Wheelchair Mobility    Modified Rankin (Stroke Patients Only)       Balance                                            Cognition Arousal/Alertness: Awake/alert Behavior During Therapy: WFL for tasks assessed/performed Overall Cognitive Status: Impaired/Different from baseline(WFL this session.  Able to state name, DOB and knowlegable where she is at)                                        Exercises General Exercises - Lower Extremity  Long Arc Quad: Both;Seated;10 reps Hip Flexion/Marching: 10 reps;Seated Toe Raises: 10 reps;Seated Heel Raises: 10 reps;Seated    General Comments        Pertinent Vitals/Pain Pain Assessment: No/denies pain    Home Living                      Prior Function            PT Goals (current goals can now be found in the care plan section)      Frequency    Min 3X/week      PT Plan Current plan remains appropriate    Co-evaluation              AM-PAC PT "6 Clicks" Mobility   Outcome Measure  Help needed turning from your back to your side while in a flat bed without using bedrails?: A  Little Help needed moving from lying on your back to sitting on the side of a flat bed without using bedrails?: A Little Help needed moving to and from a bed to a chair (including a wheelchair)?: A Little Help needed standing up from a chair using your arms (e.g., wheelchair or bedside chair)?: A Little Help needed to walk in hospital room?: A Lot Help needed climbing 3-5 steps with a railing? : Total 6 Click Score: 15    End of Session Equipment Utilized During Treatment: Gait belt Activity Tolerance: Patient tolerated treatment well Patient left: in chair;with call bell/phone within reach;with chair alarm set Nurse Communication: Mobility status PT Visit Diagnosis: Unsteadiness on feet (R26.81);Other abnormalities of gait and mobility (R26.89);Muscle weakness (generalized) (M62.81)     Time: [id number] PT Time Calculation (min) (ACUTE ONLY): 45 min  Charges:  $Therapeutic Activity: 38-52 mins                     [address] LPTA; CBIS [phone]  Aldona Lento 10/04/2019, 12:15 PM

## 2019-10-04 NOTE — Progress Notes (Signed)
Patient Demographics:    [name], is a 58 y.o. female, DOB - 09-01-1961, [id number]  Admit date - 09/26/2019   Admitting Physician Teresa Blessinger Denton Brick, MD  Outpatient Primary MD for the patient is [name], [name], NP  LOS - 7   Chief Complaint  Patient presents with   Medical Clearance        Subjective:    [name] today has no fevers, no emesis,  - - -Resting comfortably, no new concerns, ambulated with physical therapist   Assessment  & Plan :    Principal Problem:   DKA (diabetic ketoacidosis) ([name]) Active Problems:   Hypertension   COPD (chronic obstructive pulmonary disease) ([name])   Prolonged QT interval   AKI (acute kidney injury) ([name])   Brief Summary -58 y.o. female with medical history significant of asthma, COPD, depression, type 2 diabetes, HTN,  prolonged QT interval, morbid obesity and history of recurrent psychotic episodes admitted on 09/26/2019 with DKA and psychosis -Telemetry psychiatry stated that patient does not need inpatient psych management at this time --  A/p 1)DKA/uncontrolled diabetes with hyperglycemia-- bicarb is normalized, anion gap is closed,  off IV insulin,  -A1c 8.8 on 06/07/2019 at [name]  blood sugars erratic, due to inconsistent oral intake -c/n 70/30 insulin  56 units twice daily -Use Novolog/Humalog Sliding scale insulin with Accu-Cheks/Fingersticks as ordered  PTA patient was on 60 units twice daily, -Patient sees endocrinologist at [name] -Diabetic educator input noted  2)Depression and Anxiety/Psychosis --- patient evaluated by psychiatrist [name] on 09/26/2019, states no need for inpatient psychiatric care at this time,  -Patient previously treated at [name] from March through April 2020 for psychosis. -limited with ability to use antipsychotics due to  prolonged QT -Continue Zoloft 25 mg daily and BuSpar, may use iv lorazepam as needed -Intermittent Episodes of confusion persist but overall more cooperative  3)Generalized weakness and deconditioning /Disposition -discussed with patient's husband, he weighs 140 pounds, patient weighs about 300 pounds and is having difficulties with mobility related ADLs -Husband states he is on disability on unable to physically provide the Patient needs at this time - physical therapy evaluation appreciated,  recommended SNF rehab-awaiting [facility] prior to transfer to SNF rehab  4)HTN--BP much improved, continue amlodipine 10 mg daily, c/n  Coreg 6.25 mg twice daily, c/n  Imdur and hydralazine -IV labetalol as needed elevated BP  5)AKI--creatinine is down to 0.85  from 1.41 after hydration, baseline creatinine around 0.9   Disposition/Need for in-Hospital Stay- patient unable to be discharged at this time due to -inability to perform ADLs, unsafe discharge plan.  Unable to return to preadmission environment-awaiting transfer to SNF rehab -physical therapy evaluation appreciated,  recommended SNF rehab-awaiting transfer to SNF rehab  Code Status : Full  Family Communication:   Discussed with pt's husband at  -Mr. [name]-- at [phone]  Disposition Plan  :  SNF rehab  Consults  :   Psych  DVT Prophylaxis  :  Lovenox -   Lab Results  Component Value Date   PLT 317 10/04/2019    Inpatient Medications  Scheduled Meds:  amLODipine  10 mg Oral Daily   busPIRone  10 mg Oral TID   carvedilol  12.5 mg Oral BID WC   enoxaparin (LOVENOX) injection  70 mg Subcutaneous Q24H   famotidine  20 mg Oral QHS   hydrALAZINE  100 mg Oral TID   insulin aspart  0-15 Units Subcutaneous TID WC   insulin aspart  0-5 Units Subcutaneous QHS   insulin aspart protamine- aspart  56 Units Subcutaneous BID WC   isosorbide mononitrate  30 mg Oral Daily   potassium chloride  40 mEq Oral Once    Continuous Infusions:  sodium chloride 10 mL/hr at 10/01/19 1059   PRN Meds:.albuterol, alum & mag hydroxide-simeth, LORazepam    Anti-infectives (From admission, onward)   None        Objective:   Vitals:   10/04/19 0558 10/04/19 0843 10/04/19 0844 10/04/19 1326  BP: (!) 143/72 (!) 154/69 (!) 154/69 (!) 142/67  Pulse: 90 96  88  Resp: 20   19  Temp: 98 F (36.7 C)   98.2 F (36.8 C)  TempSrc: Oral   Oral  SpO2: 97%   (!) 86%  Weight:      Height:        Wt Readings from Last 3 Encounters:  09/27/19 132.4 kg  09/25/19 136.1 kg  09/20/19 136.1 kg     Intake/Output Summary (Last 24 hours) at 10/04/2019 1758 Last data filed at 10/04/2019 1700 Gross per 24 hour  Intake 720 ml  Output 500 ml  Net 220 ml     Physical Exam  Gen:- Awake Alert, morbidly obese, HEENT:- [place].AT, No sclera icterus Neck-Supple Neck,No JVD,.  Lungs-  CTAB , fair symmetrical air movement CV- S1, S2 normal, regular  Abd-  +ve B.Sounds, Abd Soft, No tenderness, increased truncal adiposity    Extremity/Skin:- No  edema, pedal pulses present  Psych--- overall more coherent, more appropriate, much less confused neuro-generalized weakness no new focal deficits, no tremors   Data Review:   Micro Results Recent Results (from the past 240 hour(s))  SARS Coronavirus 2 by RT PCR (hospital order, performed in [facility] hospital lab) Nasopharyngeal Nasopharyngeal Swab     Status: None   Collection Time: 09/30/19  2:30 AM   Specimen: Nasopharyngeal Swab  Result Value Ref Range Status   SARS Coronavirus 2 NEGATIVE NEGATIVE Final    Comment: (NOTE) If result is NEGATIVE SARS-CoV-2 target nucleic acids are NOT DETECTED. The SARS-CoV-2 RNA is generally detectable in upper and lower  respiratory specimens during the acute phase of infection. The lowest  concentration of SARS-CoV-2 viral copies this assay can detect is 250  copies / mL. A negative result does not preclude SARS-CoV-2 infection   and should not be used as the sole basis for treatment or other  patient management decisions.  A negative result may occur with  improper specimen collection / handling, submission of specimen other  than nasopharyngeal swab, presence of viral mutation(s) within the  areas targeted by this assay, and inadequate number of viral copies  (<250 copies / mL). A negative result must be combined with clinical  observations, patient history, and epidemiological information. If result is POSITIVE SARS-CoV-2 target nucleic acids are DETECTED. The SARS-CoV-2 RNA is generally detectable in upper and lower  respiratory specimens dur ing the acute phase of infection.  Positive  results are indicative of active infection with SARS-CoV-2.  Clinical  correlation with patient history and other diagnostic information is  necessary to determine patient infection status.  Positive results do  not rule out bacterial infection or co-infection with other  viruses. If result is PRESUMPTIVE POSTIVE SARS-CoV-2 nucleic acids MAY BE PRESENT.   A presumptive positive result was obtained on the submitted specimen  and confirmed on repeat testing.  While 2019 novel coronavirus  (SARS-CoV-2) nucleic acids may be present in the submitted sample  additional confirmatory testing may be necessary for epidemiological  and / or clinical management purposes  to differentiate between  SARS-CoV-2 and other Sarbecovirus currently known to infect humans.  If clinically indicated additional testing with an alternate test  methodology [phone]) is advised. The SARS-CoV-2 RNA is generally  detectable in upper and lower respiratory sp ecimens during the acute  phase of infection. The expected result is Negative. Fact Sheet for Patients:  StrictlyIdeas.no Fact Sheet for Healthcare Providers: BankingDealers.co.za This test is not yet approved or cleared by the [place] FDA  and has been authorized for detection and/or diagnosis of SARS-CoV-2 by FDA under an Emergency Use Authorization (EUA).  This EUA will remain in effect (meaning this test can be used) for the duration of the COVID-19 declaration under Section 564(b)(1) of the Act, 21 U.S.C. section 360bbb-3(b)(1), unless the authorization is terminated or revoked sooner. Performed at [facility], [address] [place], [name] [address]     Radiology Reports Dg Chest 2 View  Result Date: 09/20/2019 CLINICAL DATA:  Shortness of breath EXAM: CHEST - 2 VIEW COMPARISON:  Radiograph 09/15/2019 FINDINGS: No consolidation, features of edema, pneumothorax, or effusion. Pulmonary vascularity is normally distributed. The cardiomediastinal contours are unremarkable. No acute osseous or soft tissue abnormality. Cholecystectomy clips in the right upper quadrant. IMPRESSION: No acute cardiopulmonary abnormality. Electronically Signed   By: Lovena Le M.D.   On: 09/20/2019 03:58   Dg Chest Port 1 View  Result Date: 09/25/2019 CLINICAL DATA:  Shortness of breath for 2 hours EXAM: PORTABLE CHEST 1 VIEW COMPARISON:  Radiograph 09/20/2019 FINDINGS: Imaging quality degraded by patient body habitus. Increasing hazy interstitial opacities with some interval cephalization of the vascularity. Prominent cardiac silhouette may in part be due to the portable technique. No pneumothorax or effusion. No acute osseous or soft tissue abnormality. IMPRESSION: 1. Increasing hazy interstitial opacities with some interval cephalization of the vascularity, suspicious for interstitial edema. No pleural effusion. 2. Imaging quality degraded by patient body habitus. Electronically Signed   By: Lovena Le M.D.   On: 09/25/2019 16:12     CBC Recent Labs  Lab 09/28/19 0515 10/01/19 0627 10/04/19 0631  WBC 12.8* 8.4 8.5  HGB 15.6* 14.2 15.0  HCT 46.8* 43.2 46.1*  PLT 322 280 317  MCV 87.3 88.9 87.8  MCH 29.1 29.2 28.6  MCHC 33.3 32.9  32.5  RDW 13.5 13.2 13.3    Chemistries  Recent Labs  Lab 09/28/19 0515 10/01/19 0627 10/04/19 0631  NA 145 142 140  K 3.4* 3.4* 3.3*  CL 109 104 101  CO2 25 27 26   GLUCOSE 72 244* 172*  BUN 10 13 11   CREATININE 0.87 0.85 0.95  CALCIUM 9.1 8.9 9.2  MG 2.0  --   --    ------------------------------------------------------------------------------------------------------------------ No results for input(s): CHOL, HDL, LDLCALC, TRIG, CHOLHDL, LDLDIRECT in the last 72 hours.  Lab Results  Component Value Date   HGBA1C (H) 09/02/2008    10.0 (NOTE)   The ADA recommends the following therapeutic goal for glycemic   control related to Hgb A1C measurement:   Goal of Therapy:   < 7.0% Hgb A1C   Reference: American Diabetes Association: Clinical Practice   Recommendations  2008, Diabetes Care,  2008, 31:(Suppl 1).   ------------------------------------------------------------------------------------------------------------------ No results for input(s): TSH, T4TOTAL, T3FREE, THYROIDAB in the last 72 hours.  Invalid input(s): FREET3 ------------------------------------------------------------------------------------------------------------------ No results for input(s): VITAMINB12, FOLATE, FERRITIN, TIBC, IRON, RETICCTPCT in the last 72 hours.  Coagulation profile No results for input(s): INR, PROTIME in the last 168 hours.  No results for input(s): DDIMER in the last 72 hours.  Cardiac Enzymes No results for input(s): CKMB, TROPONINI, MYOGLOBIN in the last 168 hours.  Invalid input(s): CK ------------------------------------------------------------------------------------------------------------------    Component Value Date/Time   BNP 42.0 09/27/2019 0526   Roxan Hockey M.D on 10/04/2019 at 5:58 PM  Go to [email or web address] - for contact info  [facility] - Office  [phone]

## 2019-10-04 NOTE — Progress Notes (Signed)
Inpatient Diabetes Program Recommendations  AACE/ADA: New Consensus Statement on Inpatient Glycemic Control  Target Ranges:  Prepandial:   less than 140 mg/dL      Peak postprandial:   less than 180 mg/dL (1-2 hours)      Critically ill patients:  140 - 180 mg/dL  Results for [name], [name] (MRN [id number]) as of 10/04/2019 12:38  Ref. Range 10/03/2019 07:30 10/03/2019 11:05 10/03/2019 13:50 10/03/2019 15:55 10/03/2019 21:44 10/04/2019 07:11 10/04/2019 11:02  Glucose-Capillary Latest Ref Range: 70 - 99 mg/dL 468 (H) 434 (H) 310 (H) 264 (H) 177 (H) 204 (H) 274 (H)    Review of Glycemic Control  Diabetes history: DM2 Outpatient Diabetes medications: 70/30 60 units BID Current orders for Inpatient glycemic control: 70/30 56 units BID, Novolog 0-15 units TID with meals, Novolog 0-5 units QHS  Inpatient Diabetes Program Recommendations:   Insulin-Basal: Please consider increasing 70/30 to 60 units BID.  Thanks, Barnie Alderman, RN, MSN, CDE Diabetes Coordinator Inpatient Diabetes Program [phone] (Team Pager from 8am to 5pm)

## 2019-10-05 LAB — GLUCOSE, CAPILLARY
Glucose-Capillary: 219 mg/dL — ABNORMAL HIGH (ref 70–99)
Glucose-Capillary: 259 mg/dL — ABNORMAL HIGH (ref 70–99)
Glucose-Capillary: 78 mg/dL (ref 70–99)

## 2019-10-05 MED ORDER — ALPRAZOLAM 0.5 MG PO TABS: 0.5000 mg | ORAL_TABLET | Freq: Two times a day (BID) | ORAL | 0 refills | Status: AC | PRN

## 2019-10-05 MED ORDER — CARVEDILOL 25 MG PO TABS: 25.0000 mg | ORAL_TABLET | Freq: Two times a day (BID) | ORAL | 3 refills | Status: DC

## 2019-10-05 MED ORDER — CARVEDILOL 12.5 MG PO TABS: 12.5000 mg | ORAL_TABLET | Freq: Two times a day (BID) | ORAL | 3 refills | Status: DC

## 2019-10-05 MED ORDER — GABAPENTIN 300 MG PO CAPS: 300.0000 mg | ORAL_CAPSULE | Freq: Three times a day (TID) | ORAL | 3 refills | Status: DC

## 2019-10-05 MED ORDER — CARVEDILOL 12.5 MG PO TABS
25.0000 mg | ORAL_TABLET | Freq: Two times a day (BID) | ORAL | Status: DC
  Administered 2019-10-05: 25 mg via ORAL
  Filled 2019-10-05: qty 2

## 2019-10-05 MED ORDER — SERTRALINE HCL 50 MG PO TABS: 50.0000 mg | ORAL_TABLET | Freq: Every day | ORAL | 2 refills | Status: DC

## 2019-10-05 MED ORDER — ISOSORBIDE MONONITRATE ER 60 MG PO TB24
60.0000 mg | ORAL_TABLET | Freq: Every day | ORAL | Status: DC
  Administered 2019-10-05: 60 mg via ORAL
  Filled 2019-10-05: qty 1

## 2019-10-05 MED ORDER — BUSPIRONE HCL 10 MG PO TABS: 10.0000 mg | ORAL_TABLET | Freq: Three times a day (TID) | ORAL | 2 refills | Status: DC

## 2019-10-05 MED ORDER — ISOSORBIDE MONONITRATE ER 30 MG PO TB24: 30.0000 mg | ORAL_TABLET | Freq: Every day | ORAL | 2 refills | Status: DC

## 2019-10-05 MED ORDER — AMLODIPINE BESYLATE 10 MG PO TABS: 10.0000 mg | ORAL_TABLET | Freq: Every day | ORAL | 3 refills | Status: DC

## 2019-10-05 MED ORDER — ISOSORBIDE MONONITRATE ER 60 MG PO TB24: 60.0000 mg | ORAL_TABLET | Freq: Every day | ORAL | Status: DC

## 2019-10-05 MED ORDER — HYDRALAZINE HCL 100 MG PO TABS: 100.0000 mg | ORAL_TABLET | Freq: Three times a day (TID) | ORAL | 3 refills | Status: DC

## 2019-10-05 NOTE — Discharge Instructions (Signed)
1) please follow-up as outpatient with psychiatrist for adjustment of your psychiatric medications 2) please follow-up as outpatient with your endocrinologist at [facility] for adjustment of his insulin and diabetic medications 3) please follow-up with your primary care physician for ongoing management of your blood pressure 4)Generalized weakness and deconditioning--- you will need ongoing physical therapy to help you get stronger 5)Avoid ibuprofen/Advil/Aleve/Motrin/Goody Powders/Naproxen/BC powders/Meloxicam/Diclofenac/Indomethacin and other Nonsteroidal anti-inflammatory medications as these will make you more likely to bleed and can cause stomach ulcers, can also cause Kidney problems.  6) please take medications as prescribed please note that there has been several changes to your medications

## 2019-10-05 NOTE — Progress Notes (Signed)
Nsg Discharge Note  Admit Date:  09/26/2019 Discharge date: 10/05/2019   ARYONNA VERDERBER to be D/C'd Home per MD order.  AVS completed.  Copy for chart, and copy for patient signed, and dated. Patient/caregiver able to verbalize understanding.  Discharge Medication: Allergies as of 10/05/2019      Reactions   Benicar [olmesartan] Swelling   Codeine Other (See Comments)   Confusion    Sulfa Antibiotics Swelling   Whole face swells   Trulicity [dulaglutide] Diarrhea      Medication List    STOP taking these medications   diphenhydrAMINE 25 MG tablet Commonly known as: BENADRYL   escitalopram 20 MG tablet Commonly known as: LEXAPRO   hydrOXYzine 10 MG tablet Commonly known as: ATARAX/VISTARIL   meclizine 12.5 MG tablet Commonly known as: ANTIVERT   propranolol ER 120 MG 24 hr capsule Commonly known as: INDERAL LA     TAKE these medications   ALPRAZolam 0.5 MG tablet Commonly known as: Xanax Take 1 tablet (0.5 mg total) by mouth 2 (two) times daily as needed for anxiety or sleep.   amLODipine 10 MG tablet Commonly known as: NORVASC Take 1 tablet (10 mg total) by mouth daily. Start taking on: October 06, 2019   bethanechol 25 MG tablet Commonly known as: URECHOLINE Take 25 mg by mouth 3 (three) times daily.   busPIRone 10 MG tablet Commonly known as: BUSPAR Take 1 tablet (10 mg total) by mouth 3 (three) times daily.   carvedilol 12.5 MG tablet Commonly known as: COREG Take 1 tablet (12.5 mg total) by mouth 2 (two) times daily with a meal. (12.5 mg bid and Not 25 mg bid)   dicyclomine 20 MG tablet Commonly known as: BENTYL Take 20 mg by mouth 3 (three) times daily.   esomeprazole 20 MG capsule Commonly known as: NEXIUM Take 20 mg by mouth daily at 12 noon.   gabapentin 300 MG capsule Commonly known as: NEURONTIN Take 1 capsule (300 mg total) by mouth 3 (three) times daily. What changed:   medication strength  how much to take   hydrALAZINE  100 MG tablet Commonly known as: APRESOLINE Take 1 tablet (100 mg total) by mouth 3 (three) times daily.   insulin aspart protamine- aspart (70-30) 100 UNIT/ML injection Commonly known as: NOVOLOG MIX 70/30 Inject 60 Units into the skin 2 (two) times daily with a meal.   isosorbide mononitrate 30 MG 24 hr tablet Commonly known as: IMDUR Take 1 tablet (30 mg total) by mouth daily.   potassium chloride SA 20 MEQ tablet Commonly known as: KLOR-CON Take 20 mEq by mouth 2 (two) times daily.   ProAir HFA 108 (90 Base) MCG/ACT inhaler Generic drug: albuterol Inhale 2 puffs into the lungs every 6 (six) hours as needed for wheezing or shortness of breath.   sertraline 50 MG tablet Commonly known as: ZOLOFT Take 1 tablet (50 mg total) by mouth daily. What changed:   medication strength  how much to take   torsemide 20 MG tablet Commonly known as: DEMADEX Take 20 mg by mouth 2 (two) times daily.       Discharge Assessment: Vitals:   10/05/19 0628 10/05/19 1400  BP: (!) 189/87 (!) 110/49  Pulse: 96 81  Resp: 19 18  Temp: 98 F (36.7 C)   SpO2: 95% 95%   Skin clean, dry and intact without evidence of skin break down, no evidence of skin tears noted. IV catheter discontinued intact. Site without signs and symptoms  of complications - no redness or edema noted at insertion site, patient denies c/o pain - only slight tenderness at site.  Dressing with slight pressure applied.  D/c Instructions-Education: Discharge instructions given to patient/family with verbalized understanding. D/c education completed with patient/family including follow up instructions, medication list, d/c activities limitations if indicated, with other d/c instructions as indicated by MD - patient able to verbalize understanding, all questions fully answered. Patient instructed to return to ED, call 911, or call MD for any changes in condition.  Patient escorted via [place], and D/C home via private  auto.  Dorcas Mcmurray, LPN [phone] PM

## 2019-10-05 NOTE — Discharge Summary (Signed)
[name], is a 58 y.o. female  DOB 09-05-1961  MRN [id number].  Admission date:  09/26/2019  Admitting Physician  Roxan Hockey, MD  Discharge Date:  10/05/2019   Primary MD  Bridget Hartshorn, NP  Recommendations for primary care physician for things to follow:  - 1) please follow-up as outpatient with psychiatrist for adjustment of your psychiatric medications 2) please follow-up as outpatient with your endocrinologist at [facility] for adjustment of his insulin and diabetic medications 3) please follow-up with your primary care physician for ongoing management of your blood pressure 4)Generalized weakness and deconditioning--- you will need ongoing physical therapy to help you get stronger 5)Avoid ibuprofen/Advil/Aleve/Motrin/Goody Powders/Naproxen/BC powders/Meloxicam/Diclofenac/Indomethacin and other Nonsteroidal anti-inflammatory medications as these will make you more likely to bleed and can cause stomach ulcers, can also cause Kidney problems.  6) please take medications as prescribed please note that there has been several changes to your medications  Admission Diagnosis  Confusion [R41.0] Diabetic ketoacidosis without coma associated with type 2 diabetes mellitus ([place]) [E11.10] DKA (diabetic ketoacidosis) ([place]) [E11.10]   Discharge Diagnosis  Confusion [R41.0] Diabetic ketoacidosis without coma associated with type 2 diabetes mellitus ([place]) [E11.10] DKA (diabetic ketoacidosis) ([place]) [E11.10]    Principal Problem:   DKA (diabetic ketoacidosis) ([place]) Active Problems:   Hypertension   COPD (chronic obstructive pulmonary disease) ([facility])   Prolonged QT interval   AKI (acute kidney injury) ([place])      Past Medical History:  Diagnosis Date   Asthma    COPD (chronic obstructive pulmonary disease) ([place])    Depression    Diabetes mellitus    Diastolic dysfunction [id number]    Hypertension    Prolonged QT interval 05/14/2015    Past Surgical History:  Procedure Laterality Date   CATARACT EXTRACTION     CESAREAN SECTION     CHOLECYSTECTOMY       HPI  from the history and physical done on the day of admission:    -Patient coming from: Home.  I have personally briefly reviewed patient's old medical records in [place]  Chief Complaint: DKA.  HPI: [name] is a 58 y.o. female with medical history significant of asthma, COPD, depression, type 2 diabetes, systolic dysfunction, hypertension, prolonged QT interval who was brought to the emergency department on 09/26/2019 evening due to the patient having hallucinations.  She was seen for similar symptoms the previous day and was discharged home.  The patient is currently having hallucinations and unable to provide further history.  Her husband stated her earlier to the EDP, that she has been "talking crazy" home.  She has been complaining of visual and auditory hallucinations.  The case is being present to [place] now, since the patient apparently may have missed some of her insulin doses and is now on DKA.  ED Course: Initial vital signs initial vital signs temperature 98.1 F, pulse 113, respiration 20, blood pressure 1 146/63 mmHg and O2 sat 95% on room air.  The patient was given fluids/electrolyte replacement and  started on an insulin infusion.  White count is 8.7, hemoglobin 16.4 g/dL and platelets 268.  Sodium is 136, potassium 4.2, chloride 93 and CO2 19 mmol/L.  Glucose 741, BUN 17 and creatinine 1.33 mg/dL.  Her previous creatinine from the day before was 0.89 mg/dL.  Her bilirubin is now increased to 2.16 from 0.9 mg/dL in August 16, 2019.  Venous blood gas shows decreased bicarbonate 19.6 and increased acid base deficit of 4.2 mmol/L.  Phosphorus was 5.9 magnesium 2.1 mg/dL.  Lactic acid slightly elevated at 2.2 mmol/L.Marland Kitchen Her chest radiograph was suspicious for interstitial edema.    Hospital Course:    Brief Summary -58 y.o.femalewith medical history significant ofasthma, COPD, depression, type 2 diabetes, HTN,  prolonged QT interval, morbid obesity and history of recurrent psychotic episodes admitted on 09/26/2019 with DKA and psychosis -Telemetry psychiatry stated that patient does not need inpatient psych management at this time --  A/p 1)DKA/uncontrolled diabetes with hyperglycemia--bicarb is normalized, anion gap is closed,  off IV insulin,  -A1c 8.8 on 06/07/2019 at [place]  blood sugars erratic, due to inconsistent oral intake -Use Novolog/Humalog Sliding scale insulin with Accu-Cheks/Fingersticks as ordered  PTA patient was on 60 units twice daily, -Patient sees endocrinologist at [place] -Diabetic educator input noted -Follow-up with endocrinologist at [facility] advised  2)Depression and Anxiety/Psychosis --- patient evaluated by psychiatrist Dr. [place] Abbot on 09/26/2019, states no need for inpatient psychiatric care at this time,  -Patient previously treated at [facility] inpatient psychiatric behavioral unit from March through April 2020 for psychosis. -limited with ability to use antipsychotics due to prolonged QT -Discharge on Zoloft 50 mg daily and BuSpar 10 mg 3 times daily, as well as as needed Xanax -From the psychiatric standpoint patient has improved, cooperative, mostly oriented, mostly appropriate -Continues to have occasional episodes of confusion, redirectable  3)Generalized weakness and deconditioning /Disposition -discussed with patient's husband,  -no new focal deficits, no tremors -Gait has improved-patient requires verbal cueing for hand placement to assist with mobility and safety with rails -Overall from a gait and mobility standpoint patient has improved significantly -Discharge home with outpatient physical therapy  4)HTN--BP much improved, continue amlodipine 10 mg daily, c/n  Coreg 12.5 mg mg twice daily, c/n  Imdur 30  mg and hydralazine 100mg  3 times daily  5)AKI--creatinine is down to 0.95  from 1.41 after hydration,baseline creatinine around 0.9  - Generalized weakness,    Discharge Condition: stable  Follow UP-follow-up as outlined in discharge instructions   Consults obtained - psych  Diet and Activity recommendation:  As advised  Discharge Instructions    Discharge Instructions    Ambulatory referral to Physical Therapy   Complete by: As directed    Outpatient physical therapy with [facility] in [facility]   Call MD for:  difficulty breathing, headache or visual disturbances   Complete by: As directed    Call MD for:  persistant dizziness or light-headedness   Complete by: As directed    Call MD for:  persistant nausea and vomiting   Complete by: As directed    Call MD for:  severe uncontrolled pain   Complete by: As directed    Call MD for:  temperature >100.4   Complete by: As directed    Diet - low sodium heart healthy   Complete by: As directed    Diet Carb Modified   Complete by: As directed    Discharge instructions   Complete by: As directed    1)  please follow-up as outpatient with psychiatrist for adjustment of your psychiatric medications 2) please follow-up as outpatient with your endocrinologist at [facility] for adjustment of his insulin and diabetic medications 3) please follow-up with your primary care physician for ongoing management of your blood pressure 4)Generalized weakness and deconditioning--- you will need ongoing physical therapy to help you get stronger 5)Avoid ibuprofen/Advil/Aleve/Motrin/Goody Powders/Naproxen/BC powders/Meloxicam/Diclofenac/Indomethacin and other Nonsteroidal anti-inflammatory medications as these will make you more likely to bleed and can cause stomach ulcers, can also cause Kidney problems.  6) please take medications as prescribed please note that there has been several changes to your medications   Increase activity  slowly   Complete by: As directed         Discharge Medications     Allergies as of 10/05/2019      Reactions   Benicar [olmesartan] Swelling   Codeine Other (See Comments)   Confusion    Sulfa Antibiotics Swelling   Whole face swells   Trulicity [dulaglutide] Diarrhea      Medication List    STOP taking these medications   diphenhydrAMINE 25 MG tablet Commonly known as: BENADRYL   escitalopram 20 MG tablet Commonly known as: LEXAPRO   hydrOXYzine 10 MG tablet Commonly known as: ATARAX/VISTARIL   meclizine 12.5 MG tablet Commonly known as: ANTIVERT   propranolol ER 120 MG 24 hr capsule Commonly known as: INDERAL LA     TAKE these medications   ALPRAZolam 0.5 MG tablet Commonly known as: Xanax Take 1 tablet (0.5 mg total) by mouth 2 (two) times daily as needed for anxiety or sleep.   amLODipine 10 MG tablet Commonly known as: NORVASC Take 1 tablet (10 mg total) by mouth daily. Start taking on: October 06, 2019   bethanechol 25 MG tablet Commonly known as: URECHOLINE Take 25 mg by mouth 3 (three) times daily.   busPIRone 10 MG tablet Commonly known as: BUSPAR Take 1 tablet (10 mg total) by mouth 3 (three) times daily.   carvedilol 12.5 MG tablet Commonly known as: COREG Take 1 tablet (12.5 mg total) by mouth 2 (two) times daily with a meal. (12.5 mg bid and Not 25 mg bid)   dicyclomine 20 MG tablet Commonly known as: BENTYL Take 20 mg by mouth 3 (three) times daily.   esomeprazole 20 MG capsule Commonly known as: NEXIUM Take 20 mg by mouth daily at 12 noon.   gabapentin 300 MG capsule Commonly known as: NEURONTIN Take 1 capsule (300 mg total) by mouth 3 (three) times daily. What changed:   medication strength  how much to take   hydrALAZINE 100 MG tablet Commonly known as: APRESOLINE Take 1 tablet (100 mg total) by mouth 3 (three) times daily.   insulin aspart protamine- aspart (70-30) 100 UNIT/ML injection Commonly known as: NOVOLOG MIX  70/30 Inject 60 Units into the skin 2 (two) times daily with a meal.   isosorbide mononitrate 30 MG 24 hr tablet Commonly known as: IMDUR Take 1 tablet (30 mg total) by mouth daily.   potassium chloride SA 20 MEQ tablet Commonly known as: KLOR-CON Take 20 mEq by mouth 2 (two) times daily.   ProAir HFA 108 (90 Base) MCG/ACT inhaler Generic drug: albuterol Inhale 2 puffs into the lungs every 6 (six) hours as needed for wheezing or shortness of breath.   sertraline 50 MG tablet Commonly known as: ZOLOFT Take 1 tablet (50 mg total) by mouth daily. What changed:   medication strength  how much to take  torsemide 20 MG tablet Commonly known as: DEMADEX Take 20 mg by mouth 2 (two) times daily.       Major procedures and Radiology Reports - PLEASE review detailed and final reports for all details, in brief -   Dg Chest 2 View  Result Date: 09/20/2019 CLINICAL DATA:  Shortness of breath EXAM: CHEST - 2 VIEW COMPARISON:  Radiograph 09/15/2019 FINDINGS: No consolidation, features of edema, pneumothorax, or effusion. Pulmonary vascularity is normally distributed. The cardiomediastinal contours are unremarkable. No acute osseous or soft tissue abnormality. Cholecystectomy clips in the right upper quadrant. IMPRESSION: No acute cardiopulmonary abnormality. Electronically Signed   By: Lovena Le M.D.   On: 09/20/2019 03:58   Dg Chest Port 1 View  Result Date: 09/25/2019 CLINICAL DATA:  Shortness of breath for 2 hours EXAM: PORTABLE CHEST 1 VIEW COMPARISON:  Radiograph 09/20/2019 FINDINGS: Imaging quality degraded by patient body habitus. Increasing hazy interstitial opacities with some interval cephalization of the vascularity. Prominent cardiac silhouette may in part be due to the portable technique. No pneumothorax or effusion. No acute osseous or soft tissue abnormality. IMPRESSION: 1. Increasing hazy interstitial opacities with some interval cephalization of the vascularity,  suspicious for interstitial edema. No pleural effusion. 2. Imaging quality degraded by patient body habitus. Electronically Signed   By: Lovena Le M.D.   On: 09/25/2019 16:12   Micro Results   Recent Results (from the past 240 hour(s))  SARS Coronavirus 2 by RT PCR (hospital order, performed in [facility] hospital lab) Nasopharyngeal Nasopharyngeal Swab     Status: None   Collection Time: 09/30/19  2:30 AM   Specimen: Nasopharyngeal Swab  Result Value Ref Range Status   SARS Coronavirus 2 NEGATIVE NEGATIVE Final    Comment: (NOTE) If result is NEGATIVE SARS-CoV-2 target nucleic acids are NOT DETECTED. The SARS-CoV-2 RNA is generally detectable in upper and lower  respiratory specimens during the acute phase of infection. The lowest  concentration of SARS-CoV-2 viral copies this assay can detect is 250  copies / mL. A negative result does not preclude SARS-CoV-2 infection  and should not be used as the sole basis for treatment or other  patient management decisions.  A negative result may occur with  improper specimen collection / handling, submission of specimen other  than nasopharyngeal swab, presence of viral mutation(s) within the  areas targeted by this assay, and inadequate number of viral copies  (<250 copies / mL). A negative result must be combined with clinical  observations, patient history, and epidemiological information. If result is POSITIVE SARS-CoV-2 target nucleic acids are DETECTED. The SARS-CoV-2 RNA is generally detectable in upper and lower  respiratory specimens dur ing the acute phase of infection.  Positive  results are indicative of active infection with SARS-CoV-2.  Clinical  correlation with patient history and other diagnostic information is  necessary to determine patient infection status.  Positive results do  not rule out bacterial infection or co-infection with other viruses. If result is PRESUMPTIVE POSTIVE SARS-CoV-2 nucleic acids MAY BE  PRESENT.   A presumptive positive result was obtained on the submitted specimen  and confirmed on repeat testing.  While 2019 novel coronavirus  (SARS-CoV-2) nucleic acids may be present in the submitted sample  additional confirmatory testing may be necessary for epidemiological  and / or clinical management purposes  to differentiate between  SARS-CoV-2 and other Sarbecovirus currently known to infect humans.  If clinically indicated additional testing with an alternate test  methodology [phone]) is advised.  The SARS-CoV-2 RNA is generally  detectable in upper and lower respiratory sp ecimens during the acute  phase of infection. The expected result is Negative. Fact Sheet for Patients:  StrictlyIdeas.no Fact Sheet for Healthcare Providers: BankingDealers.co.za This test is not yet approved or cleared by the [place] FDA and has been authorized for detection and/or diagnosis of SARS-CoV-2 by FDA under an Emergency Use Authorization (EUA).  This EUA will remain in effect (meaning this test can be used) for the duration of the COVID-19 declaration under Section 564(b)(1) of the Act, 21 U.S.C. section 360bbb-3(b)(1), unless the authorization is terminated or revoked sooner. Performed at [facility], [address], [place], [place] [address]    Today   [place] today has no new concerns, -Husband at bedside -[place] at bedside -Patient requesting discharge home -Husband reluctantly agreed to take patient home      Patient has been seen and examined prior to discharge   Objective  Blood pressure (!) 110/49, pulse 81, temperature 98 F (36.7 C), temperature source Oral, resp. rate 18, height 5\' 1"  (1.549 m), weight 132.4 kg, last menstrual period 08/06/2012, SpO2 95 %.   Intake/Output Summary (Last 24 hours) at 10/05/2019 1526 Last data filed at 10/05/2019 1247 Gross per 24 hour  Intake 1080 ml    Output 1050 ml  Net 30 ml    Exam Gen:- Awake Alert, morbidly obese, HEENT:- [place].AT, No sclera icterus Neck-Supple Neck,No JVD,.  Lungs-  CTAB , good symmetrical air movement CV- S1, S2 normal, regular  Abd-  +ve B.Sounds, Abd Soft, No tenderness, increased truncal adiposity    Extremity/Skin:- No  edema, pedal pulses present  Psych---   Coherent, affect is appropriate, no significant confusion.  Interacting appropriately with staff and husband at bedside neuro-Generalized weakness,  no new focal deficits, no tremors -Gait has improved-patient requires verbal cueing for hand placement to assist with mobility and safety with rails -Overall from a gait and mobility standpoint patient has improved significantly   Data Review   CBC w Diff:  Lab Results  Component Value Date   WBC 8.5 10/04/2019   HGB 15.0 10/04/2019   HCT 46.1 (H) 10/04/2019   PLT 317 10/04/2019   LYMPHOPCT 9 09/26/2019   MONOPCT 4 09/26/2019   EOSPCT 0 09/26/2019   BASOPCT 1 09/26/2019    CMP:  Lab Results  Component Value Date   NA 140 10/04/2019   K 3.3 (L) 10/04/2019   CL 101 10/04/2019   CO2 26 10/04/2019   BUN 11 10/04/2019   CREATININE 0.95 10/04/2019   PROT 7.1 09/26/2019   ALBUMIN 3.7 09/26/2019   BILITOT 2.1 (H) 09/26/2019   ALKPHOS 108 09/26/2019   AST 30 09/26/2019   ALT 22 09/26/2019  . Total Discharge time is about 33 minutes  Roxan Hockey M.D on 10/05/2019 at 3:26 PM  Go to [email or web address] -  for contact info  [facility] - Office  [phone]

## 2019-10-11 ENCOUNTER — Ambulatory Visit: Payer: BC Managed Care – PPO | Attending: Family Medicine | Admitting: Physical Therapy

## 2019-10-12 ENCOUNTER — Other Ambulatory Visit: Payer: Self-pay

## 2019-10-12 ENCOUNTER — Encounter (HOSPITAL_COMMUNITY): Payer: Self-pay | Admitting: Emergency Medicine

## 2019-10-12 ENCOUNTER — Emergency Department (HOSPITAL_COMMUNITY)
Admission: EM | Admit: 2019-10-12 | Discharge: 2019-10-12 | Disposition: A | Discharge status: Home / Self Care | Payer: BC Managed Care – PPO | Attending: Emergency Medicine | Admitting: Emergency Medicine

## 2019-10-12 DIAGNOSIS — I10 Essential (primary) hypertension: Secondary | ICD-10-CM | POA: Diagnosis not present

## 2019-10-12 DIAGNOSIS — Z79899 Other long term (current) drug therapy: Secondary | ICD-10-CM | POA: Insufficient documentation

## 2019-10-12 DIAGNOSIS — Z7722 Contact with and (suspected) exposure to environmental tobacco smoke (acute) (chronic): Secondary | ICD-10-CM | POA: Insufficient documentation

## 2019-10-12 DIAGNOSIS — J45909 Unspecified asthma, uncomplicated: Secondary | ICD-10-CM | POA: Insufficient documentation

## 2019-10-12 DIAGNOSIS — N390 Urinary tract infection, site not specified: Secondary | ICD-10-CM | POA: Diagnosis not present

## 2019-10-12 DIAGNOSIS — R739 Hyperglycemia, unspecified: Secondary | ICD-10-CM

## 2019-10-12 DIAGNOSIS — Z794 Long term (current) use of insulin: Secondary | ICD-10-CM | POA: Diagnosis not present

## 2019-10-12 DIAGNOSIS — E1165 Type 2 diabetes mellitus with hyperglycemia: Secondary | ICD-10-CM | POA: Diagnosis not present

## 2019-10-12 DIAGNOSIS — J449 Chronic obstructive pulmonary disease, unspecified: Secondary | ICD-10-CM | POA: Insufficient documentation

## 2019-10-12 LAB — CBG MONITORING, ED
Glucose-Capillary: 507 mg/dL (ref 70–99)
Glucose-Capillary: 367 mg/dL — ABNORMAL HIGH (ref 70–99)

## 2019-10-12 LAB — URINALYSIS, ROUTINE W REFLEX MICROSCOPIC
Bilirubin Urine: NEGATIVE
Glucose, UA: >=500 mg/dL — AB
Ketones, ur: NEGATIVE mg/dL
Nitrite: POSITIVE — AB
Protein, ur: NEGATIVE mg/dL
Specific Gravity, Urine: 1.015 (ref 1.005–1.030)
WBC, UA: >50 WBC/hpf — ABNORMAL HIGH (ref 0–5)
pH: 6 (ref 5.0–8.0)

## 2019-10-12 LAB — CBC
HCT: 45.4 % (ref 36.0–46.0)
Hemoglobin: 15.1 g/dL — ABNORMAL HIGH (ref 12.0–15.0)
MCH: 29.3 pg (ref 26.0–34.0)
MCHC: 33.3 g/dL (ref 30.0–36.0)
MCV: 88.2 fL (ref 80.0–100.0)
Platelets: 305 10*3/uL (ref 150–400)
RBC: 5.15 MIL/uL — ABNORMAL HIGH (ref 3.87–5.11)
RDW: 13.2 % (ref 11.5–15.5)
WBC: 13.2 10*3/uL — ABNORMAL HIGH (ref 4.0–10.5)
nRBC: 0 % (ref 0.0–0.2)

## 2019-10-12 LAB — BASIC METABOLIC PANEL
Anion gap: 10 (ref 5–15)
BUN: 15 mg/dL (ref 6–20)
CO2: 25 mmol/L (ref 22–32)
Calcium: 8.6 mg/dL — ABNORMAL LOW (ref 8.9–10.3)
Chloride: 98 mmol/L (ref 98–111)
Creatinine, Ser: 1.06 mg/dL — ABNORMAL HIGH (ref 0.44–1.00)
GFR calc Af Amer: >60 mL/min (ref >60)
GFR calc non Af Amer: 58 mL/min — ABNORMAL LOW (ref >60)
Glucose, Bld: 523 mg/dL (ref 70–99)
Potassium: 4 mmol/L (ref 3.5–5.1)
Sodium: 133 mmol/L — ABNORMAL LOW (ref 135–145)

## 2019-10-12 MED ORDER — SODIUM CHLORIDE 0.9 % IV BOLUS
1000.0000 mL | Freq: Once | INTRAVENOUS | Status: AC
  Administered 2019-10-12: 1000 mL via INTRAVENOUS

## 2019-10-12 MED ORDER — INSULIN ASPART 100 UNIT/ML ~~LOC~~ SOLN
10.0000 [IU] | Freq: Once | SUBCUTANEOUS | Status: AC
  Administered 2019-10-12: 10 [IU] via SUBCUTANEOUS
  Filled 2019-10-12: qty 1

## 2019-10-12 MED ORDER — SODIUM CHLORIDE 0.9 % IV SOLN
1.0000 g | Freq: Once | INTRAVENOUS | Status: AC
  Administered 2019-10-12: 1 g via INTRAVENOUS
  Filled 2019-10-12: qty 10

## 2019-10-12 MED ORDER — CEPHALEXIN 500 MG PO CAPS: 500.0000 mg | ORAL_CAPSULE | Freq: Two times a day (BID) | ORAL | 0 refills | Status: AC

## 2019-10-12 MED ORDER — SODIUM CHLORIDE 0.9 % IV BOLUS: 1000.0000 mL | Freq: Once | INTRAVENOUS | Status: DC

## 2019-10-12 NOTE — ED Triage Notes (Signed)
Blood sugar was over 500 and pain w/ urination

## 2019-10-12 NOTE — ED Provider Notes (Signed)
[facility] EMERGENCY DEPARTMENT Provider Note   CSN: [id number] Arrival date & time: 10/12/19  1722     History   Chief Complaint Chief Complaint  Patient presents with  . Hyperglycemia    HPI [name] is a 58 y.o. female.     58 year old female presents with complaint of dysuria, frequency and high blood sugar.  Patient was recently admitted to the hospital from October 21-30 for hallucinations, found to be in DKA.  Attempted to have patient placed in skilled nursing facility however no facilities were available.  Patient was discharged home, went to her PCP today for follow-up and was told to come back to the ER based on high blood sugar and UTI.  Patient states that she did give herself an injection of insulin after her blood sugar was checked and found to be high.  Patient reports lower abdominal discomfort with voiding otherwise denies changes in bowel habits, denies abdominal pain, fevers, chills, nausea, vomiting.  No other complaints or concerns today.     Past Medical History:  Diagnosis Date  . Asthma   . COPD (chronic obstructive pulmonary disease) ([place])   . Depression   . Diabetes mellitus   . Diastolic dysfunction Q000111Q  . Hypertension   . Prolonged QT interval 05/14/2015    Patient Active Problem List   Diagnosis Date Noted  . DKA (diabetic ketoacidosis) ([place]) 09/27/2019  . AKI (acute kidney injury) ([place]) 09/27/2019  . Diastolic dysfunction Q000111Q  . Prolonged QT interval 05/14/2015  . Palpitations 07/23/2014  . Generalized weakness 07/23/2014  . Hypokalemia 07/22/2014  . Diabetes mellitus ([place]) 07/22/2014  . Hypertension 07/22/2014  . COPD (chronic obstructive pulmonary disease) ([place]) 07/22/2014  . Depression 07/22/2014    Past Surgical History:  Procedure Laterality Date  . CATARACT EXTRACTION    . CESAREAN SECTION    . CHOLECYSTECTOMY       OB History    Gravida  3   Para  2   Term  2   Preterm      AB  1   Living   2     SAB  1   TAB      Ectopic      Multiple      Live Births               Home Medications    Prior to Admission medications   Medication Sig Start Date End Date Taking? Authorizing Provider  albuterol (PROAIR HFA) 108 (90 BASE) MCG/ACT inhaler Inhale 2 puffs into the lungs every 6 (six) hours as needed for wheezing or shortness of breath.   Yes [provider]  ALPRAZolam (XANAX) 0.5 MG tablet Take 1 tablet (0.5 mg total) by mouth 2 (two) times daily as needed for anxiety or sleep. 10/05/19 10/04/20 Yes Emokpae, Courage, MD  amLODipine (NORVASC) 10 MG tablet Take 1 tablet (10 mg total) by mouth daily. 10/06/19  Yes Emokpae, Courage, MD  bethanechol (URECHOLINE) 25 MG tablet Take 25 mg by mouth 3 (three) times daily. 09/10/19  Yes [provider]  busPIRone (BUSPAR) 10 MG tablet Take 1 tablet (10 mg total) by mouth 3 (three) times daily. 10/05/19  Yes Roxan Hockey, MD  carvedilol (COREG) 12.5 MG tablet Take 1 tablet (12.5 mg total) by mouth 2 (two) times daily with a meal. (12.5 mg bid and Not 25 mg bid) 10/05/19  Yes Emokpae, Courage, MD  gabapentin (NEURONTIN) 300 MG capsule Take 1 capsule (300 mg  total) by mouth 3 (three) times daily. 10/05/19  Yes Emokpae, Courage, MD  hydrALAZINE (APRESOLINE) 100 MG tablet Take 1 tablet (100 mg total) by mouth 3 (three) times daily. 10/05/19  Yes Emokpae, Courage, MD  insulin aspart protamine- aspart (NOVOLOG MIX 70/30) (70-30) 100 UNIT/ML injection Inject 60 Units into the skin 2 (two) times daily with a meal.   Yes [provider]  isosorbide mononitrate (IMDUR) 30 MG 24 hr tablet Take 1 tablet (30 mg total) by mouth daily. 10/05/19 10/04/20 Yes Emokpae, Courage, MD  potassium chloride SA (K-DUR) 20 MEQ tablet Take 20 mEq by mouth 2 (two) times daily.   Yes [provider]  sertraline (ZOLOFT) 50 MG tablet Take 1 tablet (50 mg total) by mouth daily. 10/05/19  Yes Emokpae, Courage, MD  torsemide  (DEMADEX) 20 MG tablet Take 20 mg by mouth 2 (two) times daily.  03/28/18  Yes [provider]  cephALEXin (KEFLEX) 500 MG capsule Take 1 capsule (500 mg total) by mouth 2 (two) times daily for 5 days. 10/12/19 10/17/19  Tacy Learn, PA-C  dicyclomine (BENTYL) 20 MG tablet Take 20 mg by mouth 3 (three) times daily.  02/21/18   [provider]  esomeprazole (NEXIUM) 20 MG capsule Take 20 mg by mouth daily at 12 noon.    [provider]    Family History Family History  Problem Relation Age of Onset  . Stroke Mother   . Diabetes Father   . Heart failure Father   . Hypertension Father   . Diabetes Sister   . Heart failure Sister   . Hypertension Sister   . Stroke Sister   . Cancer Other   . Stroke Sister     Social History Social History   Tobacco Use  . Smoking status: Passive Smoke Exposure - Never Smoker  . Smokeless tobacco: Never Used  Substance Use Topics  . Alcohol use: No    Alcohol/week: 0.0 standard drinks  . Drug use: No     Allergies   Benicar [olmesartan], Codeine, Sulfa antibiotics, and Trulicity [dulaglutide]   Review of Systems Review of Systems  Constitutional: Negative for chills, diaphoresis and fever.  Respiratory: Negative for shortness of breath.   Cardiovascular: Negative for chest pain.  Gastrointestinal: Negative for abdominal pain, constipation, diarrhea, nausea and vomiting.  Genitourinary: Positive for dysuria and frequency.  Musculoskeletal: Negative for arthralgias and myalgias.  Skin: Negative for rash and wound.  Allergic/Immunologic: Positive for immunocompromised state.  Neurological: Negative for weakness.  Psychiatric/Behavioral: Negative for confusion.  All other systems reviewed and are negative.    Physical Exam Updated Vital Signs BP 124/64 (BP Location: Right Arm)   Pulse 84   Temp 98.3 F (36.8 C) (Oral)   Resp 20   Ht 5\' 1"  (1.549 m)   Wt 127 kg   LMP 08/06/2012   SpO2 98%   BMI 52.91  kg/m   Physical Exam Vitals signs and nursing note reviewed.  Constitutional:      General: She is not in acute distress.    Appearance: She is well-developed. She is obese. She is not diaphoretic.  HENT:     Head: Normocephalic and atraumatic.  Cardiovascular:     Rate and Rhythm: Normal rate and regular rhythm.     Pulses: Normal pulses.     Heart sounds: Normal heart sounds.  Pulmonary:     Effort: Pulmonary effort is normal.     Breath sounds: Normal breath sounds.  Abdominal:  Tenderness: There is no abdominal tenderness. There is no right CVA tenderness or left CVA tenderness.  Musculoskeletal:     Right lower leg: Edema present.     Left lower leg: Edema present.     Comments: Very slight pitting edema to bilateral lower legs  Skin:    General: Skin is warm and dry.  Neurological:     Mental Status: She is alert and oriented to person, place, and time.  Psychiatric:        Behavior: Behavior normal.      ED Treatments / Results  Labs (all labs ordered are listed, but only abnormal results are displayed) Labs Reviewed  BASIC METABOLIC PANEL - Abnormal; Notable for the following components:      Result Value   Sodium 133 (*)    Glucose, Bld 523 (*)    Creatinine, Ser 1.06 (*)    Calcium 8.6 (*)    GFR calc non Af Amer 58 (*)    All other components within normal limits  CBC - Abnormal; Notable for the following components:   WBC 13.2 (*)    RBC 5.15 (*)    Hemoglobin 15.1 (*)    All other components within normal limits  URINALYSIS, ROUTINE W REFLEX MICROSCOPIC - Abnormal; Notable for the following components:   APPearance HAZY (*)    Glucose, UA >=500 (*)    Hgb urine dipstick SMALL (*)    Nitrite POSITIVE (*)    Leukocytes,Ua MODERATE (*)    WBC, UA >50 (*)    Bacteria, UA RARE (*)    All other components within normal limits  CBG MONITORING, ED - Abnormal; Notable for the following components:   Glucose-Capillary 507 (*)    All other components  within normal limits  CBG MONITORING, ED - Abnormal; Notable for the following components:   Glucose-Capillary 367 (*)    All other components within normal limits  URINE CULTURE    EKG None  Radiology No results found.  Procedures Procedures (including critical care time)  Medications Ordered in ED Medications  sodium chloride 0.9 % bolus 1,000 mL (1,000 mLs Intravenous New Bag/Given 10/12/19 2008)  cefTRIAXone (ROCEPHIN) 1 g in sodium chloride 0.9 % 100 mL IVPB (0 g Intravenous Stopped 10/12/19 2057)  insulin aspart (novoLOG) injection 10 Units (10 Units Subcutaneous Given 10/12/19 2021)     Initial Impression / Assessment and Plan / ED Course  I have reviewed the triage vital signs and the nursing notes.  Pertinent labs & imaging results that were available during my care of the patient were reviewed by me and considered in my medical decision making (see chart for details).  Clinical Course as of Oct 11 2153  Fri Oct 11, 3764  4350 58 year old female presents with complaint of dysuria and frequency with high blood sugar.  Patient states yesterday her blood sugar was 180, today her blood sugar was greater than 500.  Review of patient's PCP notes, note elevated blood sugar with urinalysis consistent with UTI.  Patient was advised to go to the ER for treatment of both conditions as well as discuss placement in nursing facility.  Patient has a certified letter from the state with her where she has qualified for 60 days of inpatient psychiatric care.  Gust this further with patient, patient does not want to stay in the emergency room while awaiting placement for this, she and her husband are comfortable with going home and will follow up with her PCP for  this if necessary.  Patient's initial blood sugar was 523 on her BMP, she is not in DKA today, she was given tenderness of insulin and IV fluids and her blood sugar is improved to 367.  Patient to continue to monitor and dose her insulin  accordingly at home.  CBC of my leukocytosis of 13,000.  Patient was given Rocephin while awaiting urinalysis today.   [LM]    Clinical Course User Index [LM] Tacy Learn, PA-C      Final Clinical Impressions(s) / ED Diagnoses   Final diagnoses:  Hyperglycemia  Urinary tract infection in female    ED Discharge Orders         Ordered    cephALEXin (KEFLEX) 500 MG capsule  2 times daily     10/12/19 2153           Tacy Learn, PA-C 10/12/19 2154    Elnora Morrison, MD 10/12/19 [phone]

## 2019-10-12 NOTE — ED Notes (Signed)
Patient able to ambulate to restroom to give a urine specimen.

## 2019-10-12 NOTE — Discharge Instructions (Addendum)
Continue to monitor manage your blood sugar at home.  Take Keflex as prescribed and complete the full course.  ER for new or worsening symptoms otherwise follow-up with your PCP on Monday as discussed.

## 2019-10-16 LAB — URINE CULTURE: Culture: 30000 — AB

## 2019-10-17 ENCOUNTER — Telehealth: Payer: Self-pay | Admitting: Emergency Medicine

## 2019-10-17 NOTE — Telephone Encounter (Signed)
Post ED Visit - Positive Culture Follow-up  Culture report reviewed by antimicrobial stewardship pharmacist: [place] Team []  Elenor Quinones, Pharm.D. []  Heide Guile, Pharm.D., BCPS AQ-ID []  Parks Neptune, Pharm.D., BCPS []  Alycia Rossetti, Pharm.D., BCPS []  [place], [place].D., BCPS, AAHIVP []  Legrand Como, Pharm.D., BCPS, AAHIVP []  Salome Arnt, PharmD, BCPS []  Johnnette Gourd, PharmD, BCPS []  Hughes Better, PharmD, BCPS []  Leeroy Cha, PharmD []  Laqueta Linden, PharmD, BCPS []  Albertina Parr, PharmD  [place] Team []  Leodis Sias, PharmD []  Lindell Spar, PharmD []  Royetta Asal, PharmD []  Graylin Shiver, Rph []  Rema Fendt) Glennon Mac, PharmD []  Arlyn Dunning, PharmD []  Netta Cedars, PharmD []  Dia Sitter, PharmD []  Leone Haven, PharmD []  Gretta Arab, PharmD []  Theodis Shove, PharmD []  Peggyann Juba, PharmD []  Reuel Boom, PharmD   Positive  culture Treated with cephalexin, organism sensitive to the same and no further patient follow-up is required at this time.  Hazle Nordmann 10/17/2019, 1:12 PM

## 2019-12-21 ENCOUNTER — Ambulatory Visit: Payer: BC Managed Care – PPO | Admitting: Pulmonary Disease

## 2019-12-21 ENCOUNTER — Other Ambulatory Visit: Payer: Self-pay

## 2019-12-21 ENCOUNTER — Encounter: Payer: Self-pay | Admitting: Pulmonary Disease

## 2019-12-21 DIAGNOSIS — J45909 Unspecified asthma, uncomplicated: Secondary | ICD-10-CM | POA: Insufficient documentation

## 2019-12-21 DIAGNOSIS — J4520 Mild intermittent asthma, uncomplicated: Secondary | ICD-10-CM | POA: Diagnosis not present

## 2019-12-21 DIAGNOSIS — G4733 Obstructive sleep apnea (adult) (pediatric): Secondary | ICD-10-CM | POA: Insufficient documentation

## 2019-12-21 NOTE — Assessment & Plan Note (Signed)
Given excessive daytime somnolence, narrow pharyngeal exam, witnessed apneas & loud snoring, obstructive sleep apnea is very likely & an overnight polysomnogram will be scheduled as a home study.  Her prior study was stable in the lab and she just was unable to sleep and she feels that she will be again able to sleep in the sleep lab.  Also note her variable sleep pattern where she wakes up for a few hours around 11:00 and then goes back to sleep I think it would be best for her to have a home sleep test  The pathophysiology of obstructive sleep apnea , it's cardiovascular consequences & modes of treatment including CPAP were discused with the patient in detail & they evidenced understanding. We discussed treatment options and she would be amenable to a using a CPAP if she needs it

## 2019-12-21 NOTE — Patient Instructions (Signed)
Schedule home sleep test -based on this we will try to get your CPAP machine if you qualify.  Stay on albuterol 2 puffs as needed for shortness of breath or wheezing

## 2019-12-21 NOTE — Addendum Note (Signed)
Addended by: Lia Foyer R on: 12/21/2019 04:36 PM   Modules accepted: Orders

## 2019-12-21 NOTE — Assessment & Plan Note (Signed)
Continue albuterol MDI 2 puffs every 6 hours as needed for shortness of breath or wheezing Seems well controlled, no nocturnal symptoms

## 2019-12-21 NOTE — Progress Notes (Signed)
Subjective:    Patient ID: [name], female    DOB: August 13, 1961, 59 y.o.   MRN: [id number]  HPI  Chief Complaint  Patient presents with  . Consult    Patient is here for sleep apnea and asthma. Patient did a sleep study years ago.     59 year old morbidly obese type I diabetic referred for evaluation of asthma and OSA She also carries a diagnosis of chronic diastolic heart failure and type 1 diabetes since age 34 Note hospital admission 09/2019 for 13 days for DKA and UTI causing acute kidney injury.  She reports diagnosis of asthma about 30 years ago, has not required prednisone in the past few years, seems to be controlled on albuterol MDI as needed.  Denies nocturnal symptoms, worse in spring and fall, triggers include exercise and chest colds  Epworth sleepiness score is 12 and she reports sleepiness in social situations such as watching TV, afternoons and as a passenger in a car. Bedtime is around 7 PM, she wakes up around 11:30 PM to watch Harrison Mons on TV and then stays in bed but is awake until she falls asleep around 3 PM and is finally out of bed around 8 PM.  She sleeps on her side with 2 pillows, reports 3-4 nocturnal awakenings including nocturia, feels tired when she wakes up with some dryness of mouth but denies headaches.  No snoring has been noted by family members.  She reports occasional gasping episodes that wake her up from sleep There is no history suggestive of cataplexy, sleep paralysis or parasomnias  She has considerable anxiety and is maintained on BuSpar and Ativan which she takes nightly She had a sleep study done years ago in the sleep lab but really did not sleep much and so no information was obtained     Past Medical History:  Diagnosis Date  . Asthma   . COPD (chronic obstructive pulmonary disease) ([place])   . Depression   . Diabetes mellitus   . Diastolic dysfunction Q000111Q  . Hypertension   . Prolonged QT interval 05/14/2015   Past  Surgical History:  Procedure Laterality Date  . CATARACT EXTRACTION    . CESAREAN SECTION    . CHOLECYSTECTOMY      Allergies  Allergen Reactions  . Benicar [Olmesartan] Swelling  . Codeine Other (See Comments)    Confusion   . Sulfa Antibiotics Swelling    Whole face swells  . Trulicity [Dulaglutide] Diarrhea    Social History   Socioeconomic History  . Marital status: Married    Spouse name: Not on file  . Number of children: Not on file  . Years of education: Not on file  . Highest education level: Not on file  Occupational History  . Not on file  Tobacco Use  . Smoking status: Passive Smoke Exposure - Never Smoker  . Smokeless tobacco: Never Used  Substance and Sexual Activity  . Alcohol use: No    Alcohol/week: 0.0 standard drinks  . Drug use: No  . Sexual activity: Yes    Birth control/protection: None  Other Topics Concern  . Not on file  Social History Narrative  . Not on file   Social Determinants of Health   Financial Resource Strain:   . Difficulty of Paying Living Expenses: Not on file  Food Insecurity:   . Worried About Charity fundraiser in the Last Year: Not on file  . Ran Out of Food in the Last Year: Not  on file  Transportation Needs:   . Lack of Transportation (Medical): Not on file  . Lack of Transportation (Non-Medical): Not on file  Physical Activity:   . Days of Exercise per Week: Not on file  . Minutes of Exercise per Session: Not on file  Stress:   . Feeling of Stress : Not on file  Social Connections:   . Frequency of Communication with Friends and Family: Not on file  . Frequency of Social Gatherings with Friends and Family: Not on file  . Attends Religious Services: Not on file  . Active Member of Clubs or Organizations: Not on file  . Attends Archivist Meetings: Not on file  . Marital Status: Not on file  Intimate Partner Violence:   . Fear of Current or Ex-Partner: Not on file  . Emotionally Abused: Not on file    . Physically Abused: Not on file  . Sexually Abused: Not on file     Family History  Problem Relation Age of Onset  . Stroke Mother   . Diabetes Father   . Heart failure Father   . Hypertension Father   . Diabetes Sister   . Heart failure Sister   . Hypertension Sister   . Stroke Sister   . Cancer Other   . Stroke Sister      Review of Systems positive for dyspnea on exertion chronic bipedal edema  Constitutional: negative for anorexia, fevers and sweats  Eyes: negative for irritation, redness and visual disturbance  Ears, nose, mouth, throat, and face: negative for earaches, epistaxis, nasal congestion and sore throat  Respiratory: negative for cough sputum and wheezing  Cardiovascular: negative for chest pain,  orthopnea, palpitations and syncope  Gastrointestinal: negative for abdominal pain, constipation, diarrhea, melena, nausea and vomiting  Genitourinary:negative for dysuria, frequency and hematuria  Hematologic/lymphatic: negative for bleeding, easy bruising and lymphadenopathy  Musculoskeletal:negative for arthralgias, muscle weakness and stiff joints  Neurological: negative for coordination problems, gait problems, headaches and weakness  Endocrine: negative for diabetic symptoms including polydipsia, polyuria and weight loss     Objective:   Physical Exam  Gen. Pleasant, obese, in no distress, normal affect ENT - no pallor,icterus, no post nasal drip, class 2-3 airway Neck: No JVD, no thyromegaly, no carotid bruits Lungs: no use of accessory muscles, no dullness to percussion, decreased without rales or rhonchi  Cardiovascular: Rhythm regular, heart sounds  normal, no murmurs or gallops, 1+ peripheral edema Abdomen: soft and non-tender, no hepatosplenomegaly, BS normal. Musculoskeletal: No deformities, no cyanosis or clubbing Neuro:  alert, non focal, no tremors       Assessment & Plan:

## 2019-12-26 ENCOUNTER — Telehealth: Payer: Self-pay | Admitting: *Deleted

## 2019-12-26 ENCOUNTER — Ambulatory Visit: Payer: BC Managed Care – PPO | Admitting: Cardiology

## 2019-12-26 ENCOUNTER — Encounter (HOSPITAL_COMMUNITY): Payer: Self-pay | Admitting: Radiology

## 2019-12-26 ENCOUNTER — Emergency Department (HOSPITAL_COMMUNITY): Payer: BC Managed Care – PPO

## 2019-12-26 ENCOUNTER — Emergency Department (HOSPITAL_COMMUNITY)
Admission: EM | Admit: 2019-12-26 | Discharge: 2019-12-26 | Disposition: A | Discharge status: Home / Self Care | Payer: BC Managed Care – PPO | Attending: Emergency Medicine | Admitting: Emergency Medicine

## 2019-12-26 DIAGNOSIS — Z7722 Contact with and (suspected) exposure to environmental tobacco smoke (acute) (chronic): Secondary | ICD-10-CM | POA: Diagnosis not present

## 2019-12-26 DIAGNOSIS — Z79899 Other long term (current) drug therapy: Secondary | ICD-10-CM | POA: Diagnosis not present

## 2019-12-26 DIAGNOSIS — E876 Hypokalemia: Secondary | ICD-10-CM

## 2019-12-26 DIAGNOSIS — Z794 Long term (current) use of insulin: Secondary | ICD-10-CM | POA: Insufficient documentation

## 2019-12-26 DIAGNOSIS — J449 Chronic obstructive pulmonary disease, unspecified: Secondary | ICD-10-CM | POA: Insufficient documentation

## 2019-12-26 DIAGNOSIS — R42 Dizziness and giddiness: Secondary | ICD-10-CM | POA: Insufficient documentation

## 2019-12-26 DIAGNOSIS — I10 Essential (primary) hypertension: Secondary | ICD-10-CM | POA: Diagnosis not present

## 2019-12-26 DIAGNOSIS — E119 Type 2 diabetes mellitus without complications: Secondary | ICD-10-CM | POA: Diagnosis not present

## 2019-12-26 LAB — HEPATIC FUNCTION PANEL
ALT: 17 U/L (ref 0–44)
AST: 19 U/L (ref 15–41)
Albumin: 3.5 g/dL (ref 3.5–5.0)
Alkaline Phosphatase: 102 U/L (ref 38–126)
Bilirubin, Direct: 0.1 mg/dL (ref 0.0–0.2)
Indirect Bilirubin: 0.7 mg/dL (ref 0.3–0.9)
Total Bilirubin: 0.8 mg/dL (ref 0.3–1.2)
Total Protein: 7 g/dL (ref 6.5–8.1)

## 2019-12-26 LAB — CBC WITH DIFFERENTIAL/PLATELET
Abs Immature Granulocytes: 0.03 10*3/uL (ref 0.00–0.07)
Basophils Absolute: 0.1 10*3/uL (ref 0.0–0.1)
Basophils Relative: 1 %
Eosinophils Absolute: 0.4 10*3/uL (ref 0.0–0.5)
Eosinophils Relative: 4 %
HCT: 46.2 % — ABNORMAL HIGH (ref 36.0–46.0)
Hemoglobin: 15.1 g/dL — ABNORMAL HIGH (ref 12.0–15.0)
Immature Granulocytes: 0 %
Lymphocytes Relative: 16 %
Lymphs Abs: 1.6 10*3/uL (ref 0.7–4.0)
MCH: 28 pg (ref 26.0–34.0)
MCHC: 32.7 g/dL (ref 30.0–36.0)
MCV: 85.7 fL (ref 80.0–100.0)
Monocytes Absolute: 0.6 10*3/uL (ref 0.1–1.0)
Monocytes Relative: 6 %
Neutro Abs: 7.1 10*3/uL (ref 1.7–7.7)
Neutrophils Relative %: 73 %
Platelets: 307 10*3/uL (ref 150–400)
RBC: 5.39 MIL/uL — ABNORMAL HIGH (ref 3.87–5.11)
RDW: 14.1 % (ref 11.5–15.5)
WBC: 9.9 10*3/uL (ref 4.0–10.5)
nRBC: 0 % (ref 0.0–0.2)

## 2019-12-26 LAB — URINALYSIS, ROUTINE W REFLEX MICROSCOPIC
Bilirubin Urine: NEGATIVE
Glucose, UA: NEGATIVE mg/dL
Hgb urine dipstick: NEGATIVE
Ketones, ur: NEGATIVE mg/dL
Leukocytes,Ua: NEGATIVE
Nitrite: NEGATIVE
Protein, ur: NEGATIVE mg/dL
Specific Gravity, Urine: 1.009 (ref 1.005–1.030)
pH: 6 (ref 5.0–8.0)

## 2019-12-26 LAB — BASIC METABOLIC PANEL
Anion gap: 10 (ref 5–15)
BUN: 17 mg/dL (ref 6–20)
CO2: 29 mmol/L (ref 22–32)
Calcium: 9.1 mg/dL (ref 8.9–10.3)
Chloride: 102 mmol/L (ref 98–111)
Creatinine, Ser: 0.85 mg/dL (ref 0.44–1.00)
GFR calc Af Amer: >60 mL/min (ref >60)
GFR calc non Af Amer: >60 mL/min (ref >60)
Glucose, Bld: 68 mg/dL — ABNORMAL LOW (ref 70–99)
Potassium: 3.2 mmol/L — ABNORMAL LOW (ref 3.5–5.1)
Sodium: 141 mmol/L (ref 135–145)

## 2019-12-26 LAB — CBG MONITORING, ED
Glucose-Capillary: 73 mg/dL (ref 70–99)
Glucose-Capillary: 101 mg/dL — ABNORMAL HIGH (ref 70–99)
Glucose-Capillary: 38 mg/dL — CL (ref 70–99)

## 2019-12-26 LAB — TROPONIN I (HIGH SENSITIVITY): Troponin I (High Sensitivity): 3 ng/L (ref <18)

## 2019-12-26 IMAGING — CT CT HEAD W/O CM
3 series · 16 of 47 positions shown, 19 images · non-contrast
Comparison: [DATE]

CLINICAL DATA: Vertigo.

EXAM:
CT HEAD WITHOUT CONTRAST
TECHNIQUE: Contiguous axial images were obtained from the base of the skull
through the vertex without intravenous contrast.

[Series 2: head w o · axial · 0.43mm/px · z∈[+1572,+1697]mm · 10 of 31 slices shown, 13 images]
[im 3/31  brain]
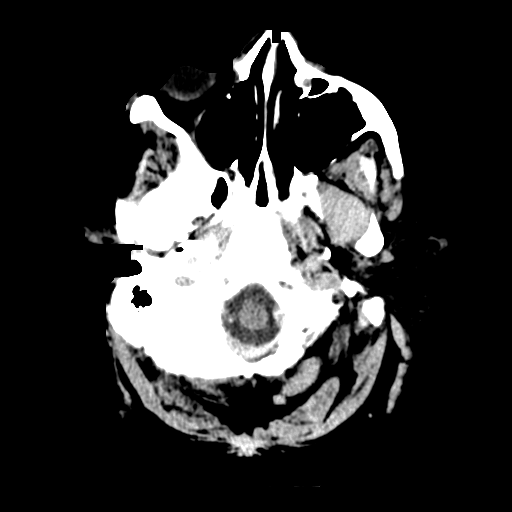
[im 3/31  bone]
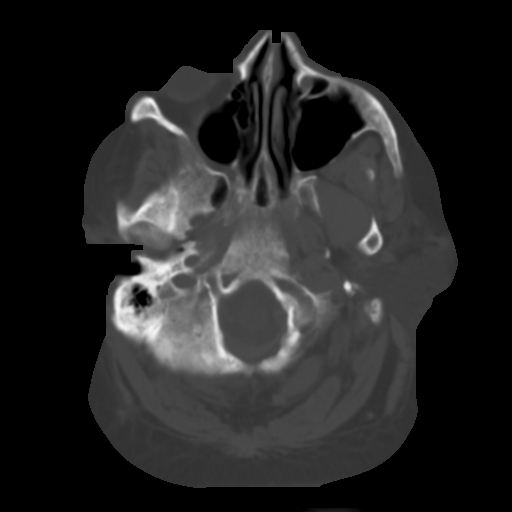
[im 6/31  brain]
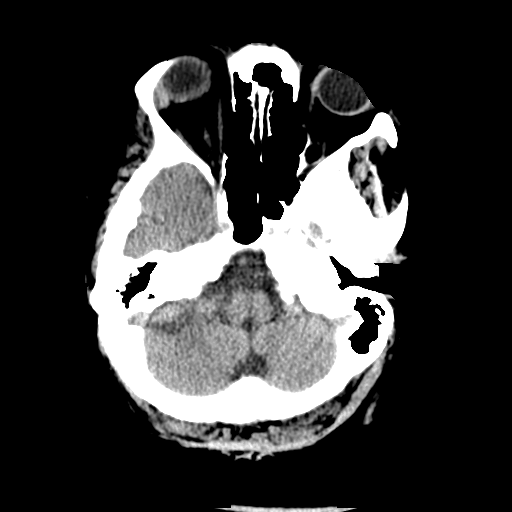
[im 9/31  brain]
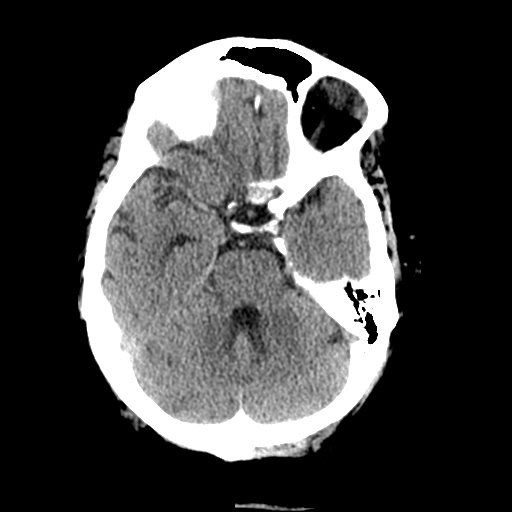
[im 11/31  brain]
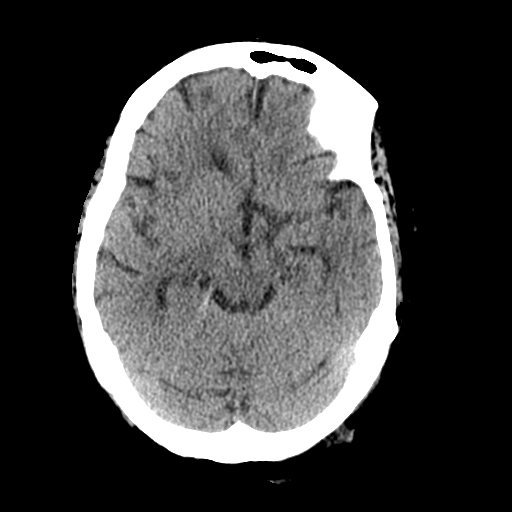
[im 14/31  brain]
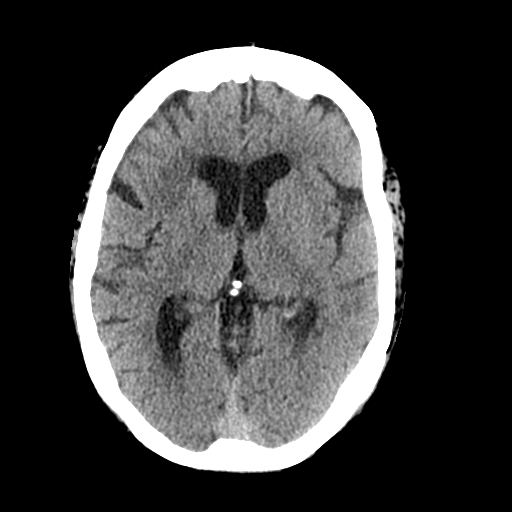
[im 14/31  bone]
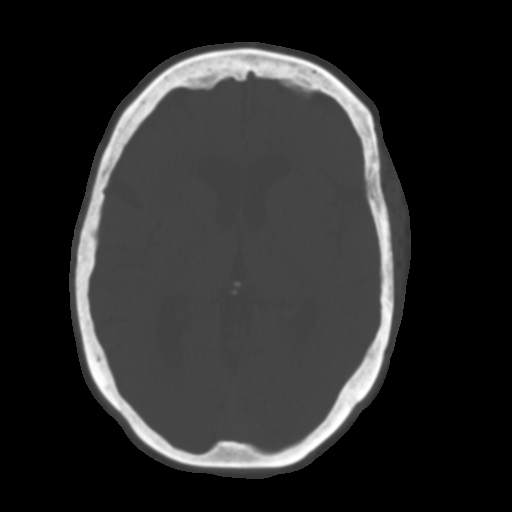
[im 17/31  brain]
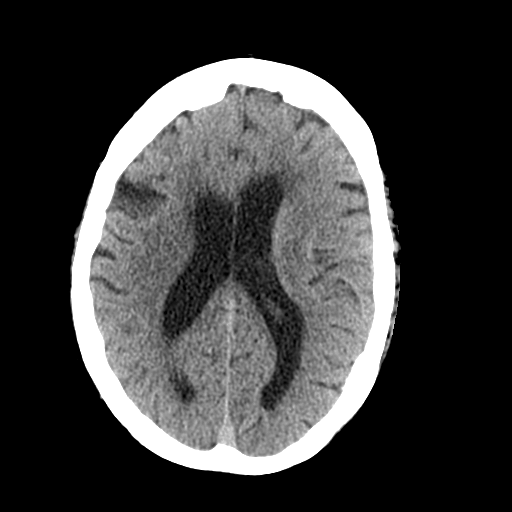
[im 20/31  brain]
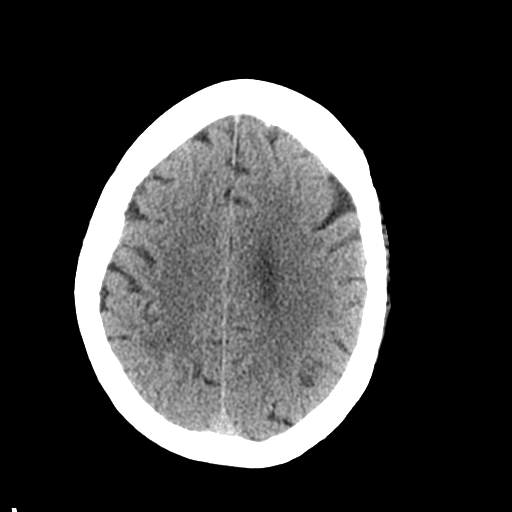
[im 23/31  brain]
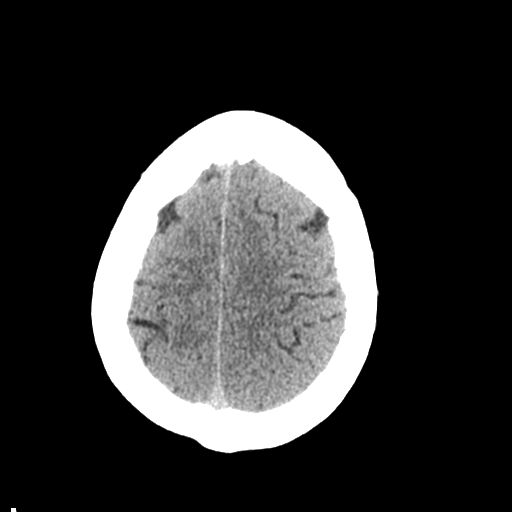
[im 25/31  brain]
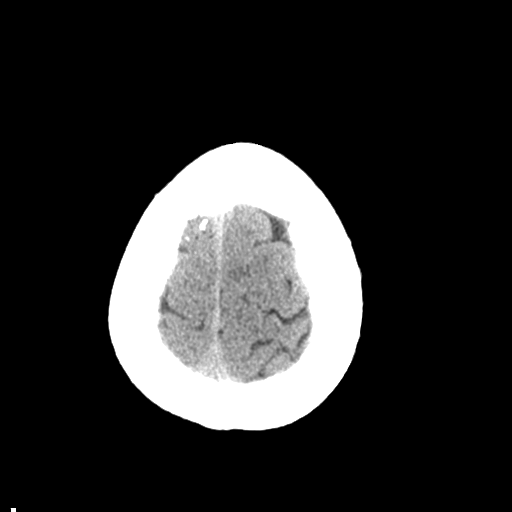
[im 25/31  bone]
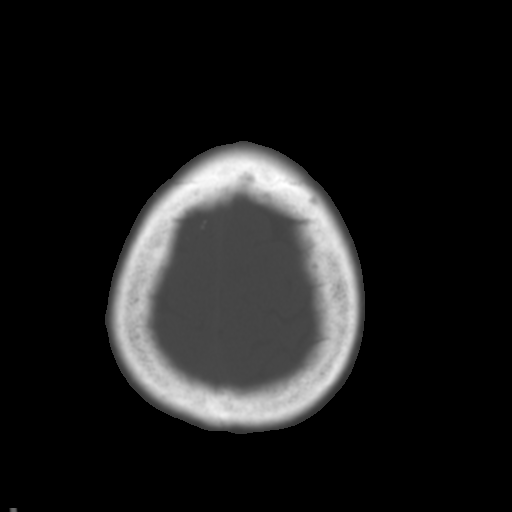
[im 28/31  brain]
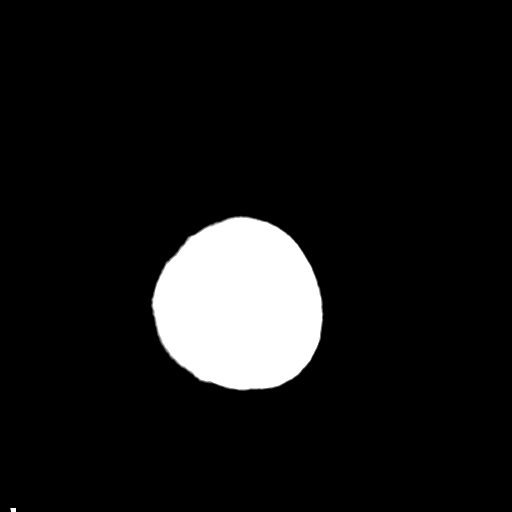

[Series 4: coronal soft · coronal · 0.33mm/px · 3 of 70 slices shown]
[im 24/70  brain]
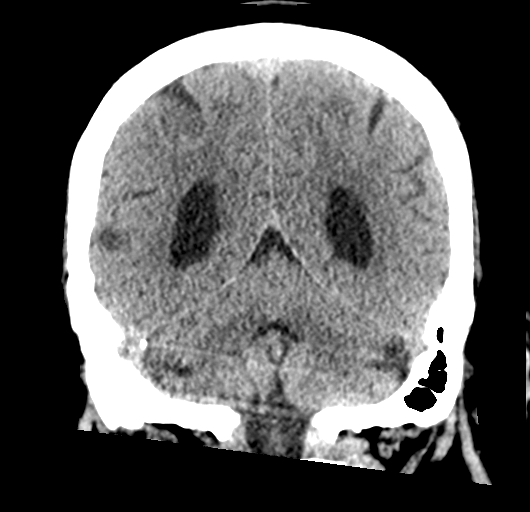
[im 31/70  brain]
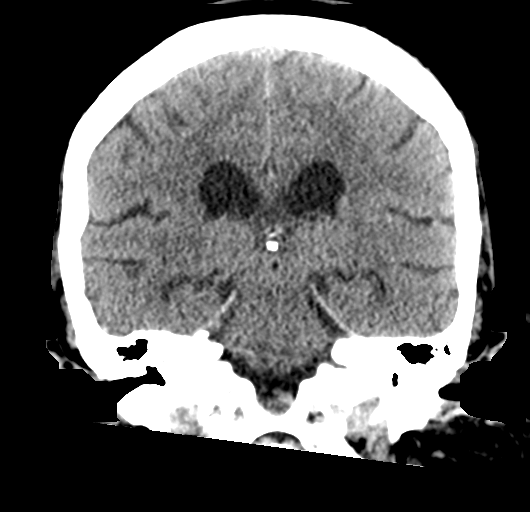
[im 39/70  brain]
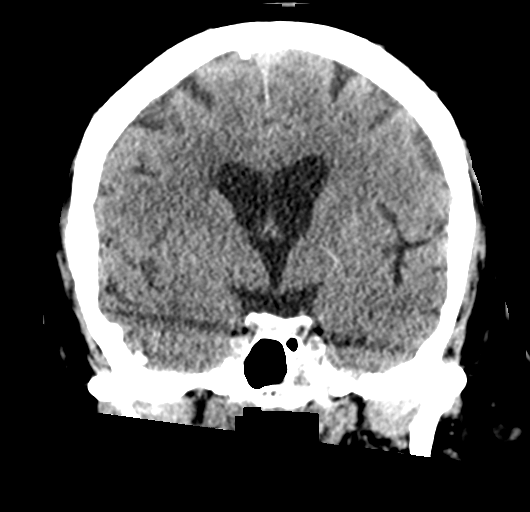

[Series 5: sagittal soft · sagittal · 0.31mm/px · 3 of 61 slices shown]
[im 22/61  brain]
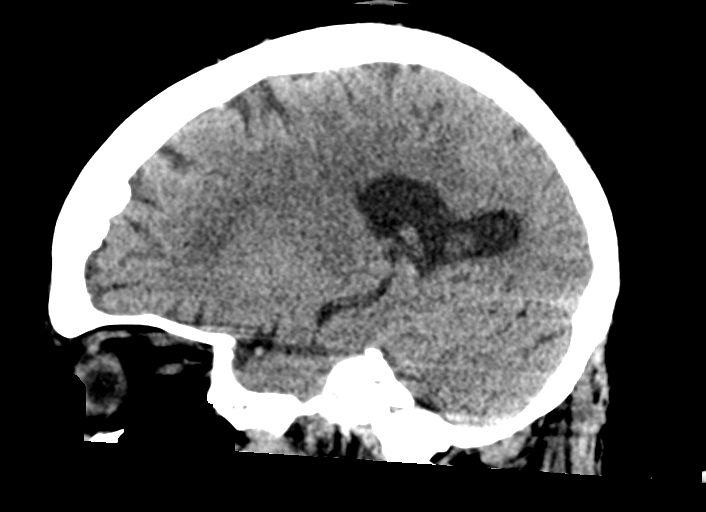
[im 31/61  brain]
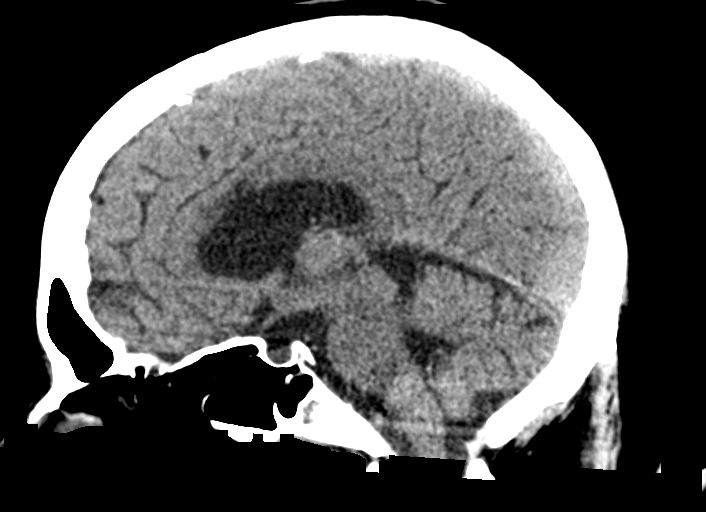
[im 40/61  brain]
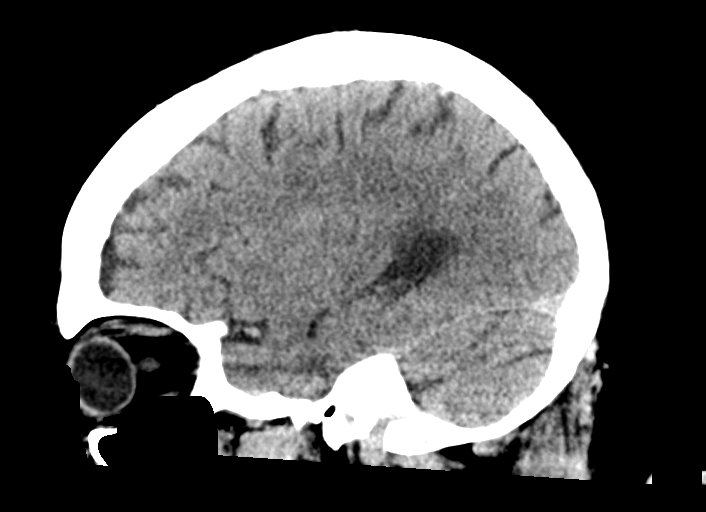

[16 of 47 positions shown; findings below may reference images not displayed]

FINDINGS: Brain: There is mild cerebral atrophy with widening of the
extra-axial spaces and ventricular dilatation.
There are areas of decreased attenuation within the white matter
tracts of the supratentorial brain, consistent with microvascular
disease changes.

Vascular: No hyperdense vessel or unexpected calcification.

Skull: Normal. Negative for fracture or focal lesion.

Sinuses/Orbits: No acute finding.

Other: None.
IMPRESSION: No acute intracranial pathology.

## 2019-12-26 IMAGING — DX DG CHEST 1V PORT
2 series · 2 of 2 positions shown · non-contrast
Comparison: [DATE]

CLINICAL DATA: Weakness

EXAM:
PORTABLE CHEST 1 VIEW

[chest ap (1 of 2)]
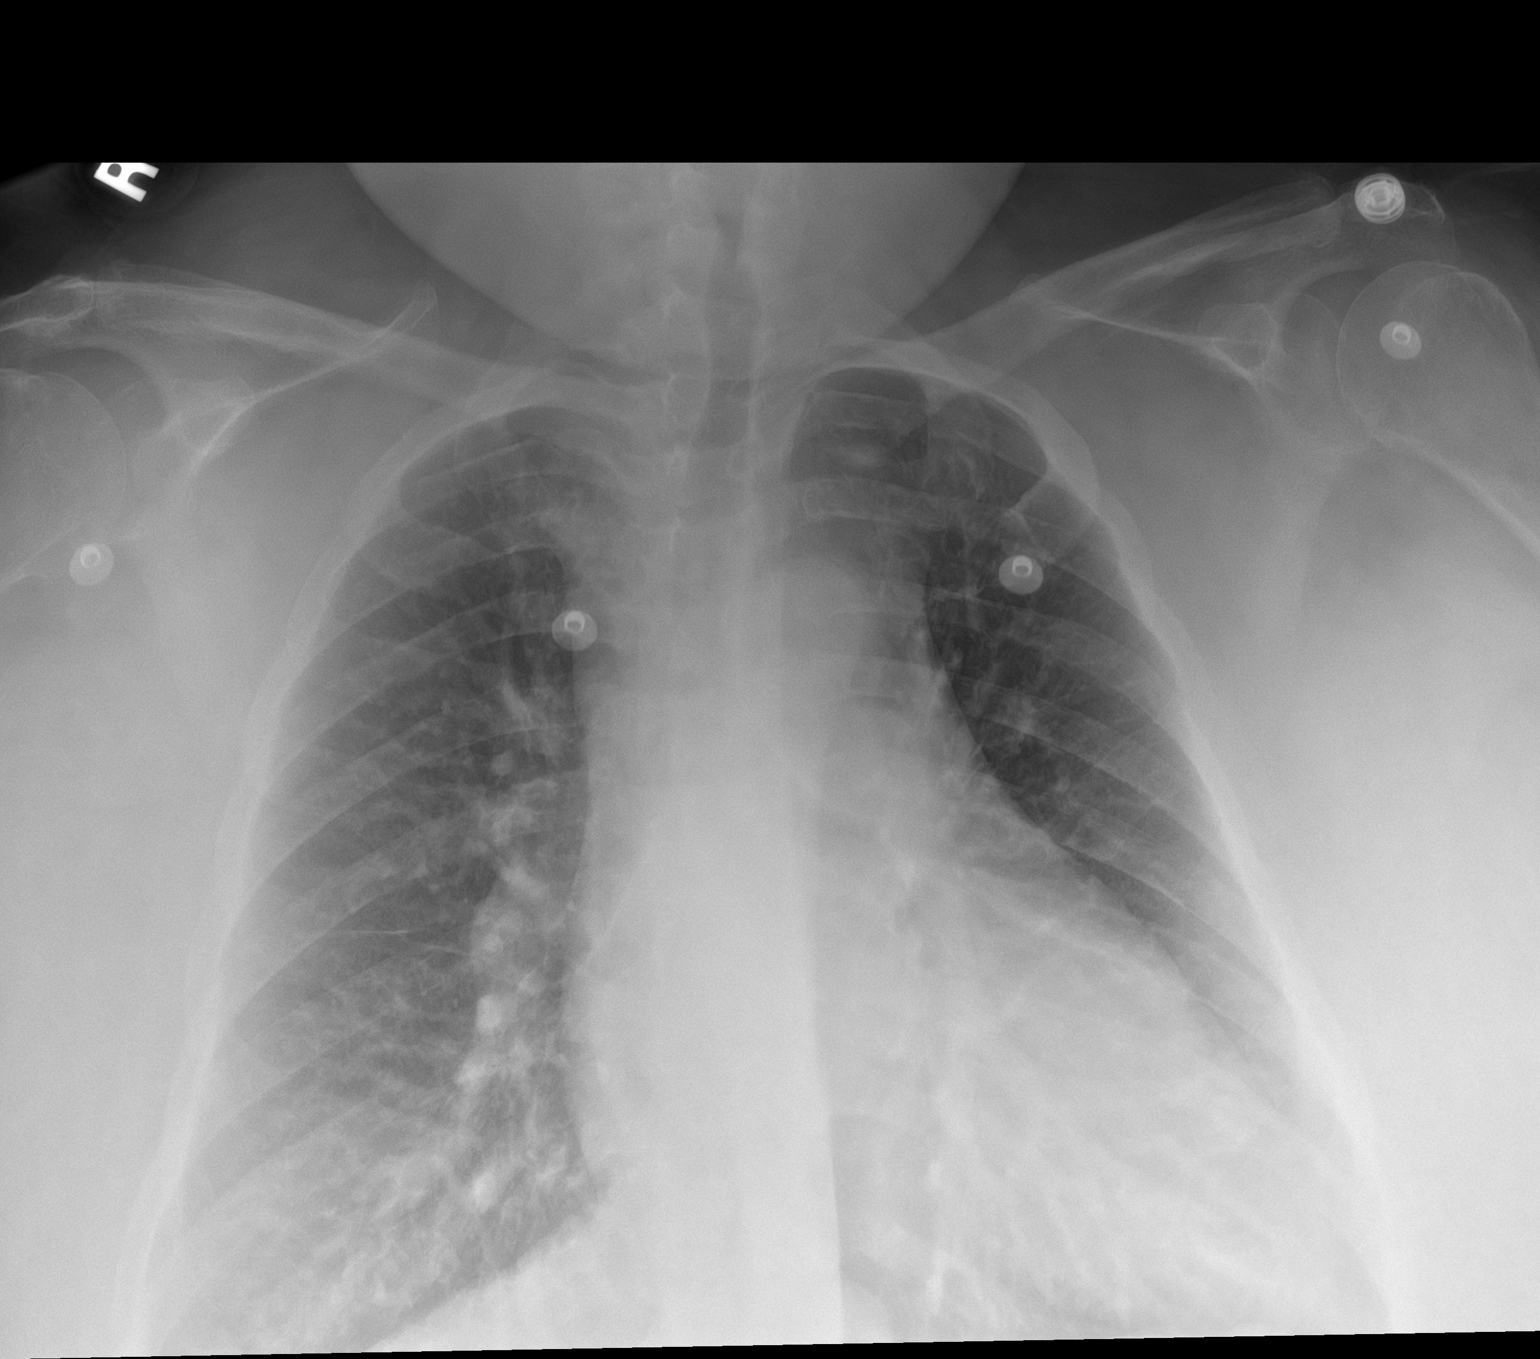

[chest ap (2 of 2)]
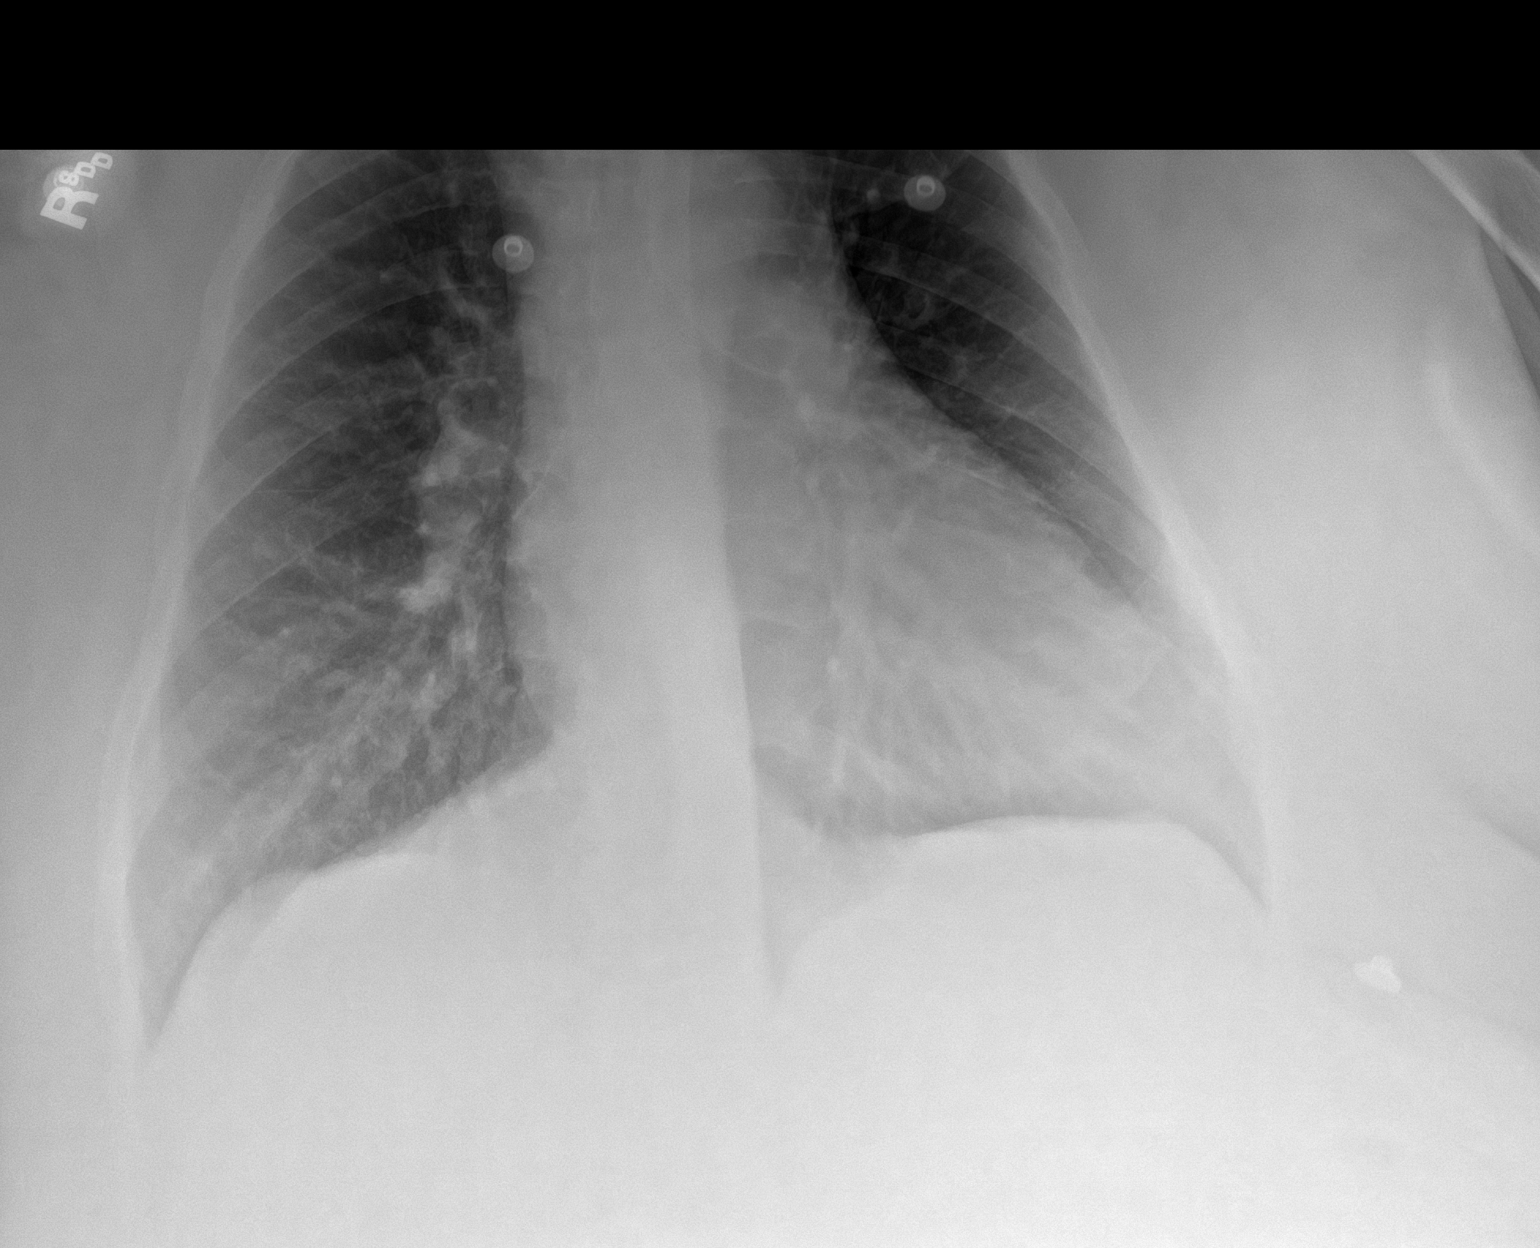

[2 of 2 positions shown; findings below may reference images not displayed]

FINDINGS: Mild cardiomegaly with central vascular congestion. No
consolidation, pleural effusion, or pneumothorax.
IMPRESSION: Cardiomegaly with central vascular congestion.

## 2019-12-26 MED ORDER — POTASSIUM CHLORIDE CRYS ER 20 MEQ PO TBCR
40.0000 meq | EXTENDED_RELEASE_TABLET | Freq: Once | ORAL | Status: AC
  Administered 2019-12-26: 40 meq via ORAL
  Filled 2019-12-26: qty 2

## 2019-12-26 MED ORDER — POTASSIUM CHLORIDE ER 20 MEQ PO TBCR: 10.0000 meq | EXTENDED_RELEASE_TABLET | Freq: Every day | ORAL | 0 refills | Status: DC

## 2019-12-26 NOTE — ED Triage Notes (Signed)
Pt has been feeling dizzy, lightheaded, a red face, and can't concentrate. Symptoms started when she started taking Carvedilol. Pt's CBG 87. History of COPD, CHF, and obesity. Denies any COVID symptoms.

## 2019-12-26 NOTE — Progress Notes (Deleted)
Cardiology Office Note   Date:  12/26/2019   ID:  [name], DOB May 24, 1961, MRN [id number]  PCP:  Bridget Hartshorn, NP  Cardiologist:   No primary care provider on file. Referring:  ***  No chief complaint on file.     History of Present Illness: [name] is a 59 y.o. female who presents for ***    She was seen by [place] greater than 3 years ago.  She has a history of chronic diastolic heart failure and prolonged QT.  She has sleep apnea but is not limited to CPAP machine.  She had a stress perfusion study 3 years ago which was negative for any evidence of ischemia.   Past Medical History:  Diagnosis Date  . Asthma   . COPD (chronic obstructive pulmonary disease) ([place])   . Depression   . Diabetes mellitus   . Diastolic dysfunction Q000111Q  . Hypertension   . Prolonged QT interval 05/14/2015    Past Surgical History:  Procedure Laterality Date  . CATARACT EXTRACTION    . CESAREAN SECTION    . CHOLECYSTECTOMY       Current Outpatient Medications  Medication Sig Dispense Refill  . albuterol (PROAIR HFA) 108 (90 BASE) MCG/ACT inhaler Inhale 2 puffs into the lungs every 6 (six) hours as needed for wheezing or shortness of breath.    . ALPRAZolam (XANAX) 0.5 MG tablet Take 1 tablet (0.5 mg total) by mouth 2 (two) times daily as needed for anxiety or sleep. (Patient not taking: Reported on 12/21/2019) 12 tablet 0  . amLODipine (NORVASC) 10 MG tablet Take 1 tablet (10 mg total) by mouth daily. 30 tablet 3  . bethanechol (URECHOLINE) 25 MG tablet Take 25 mg by mouth 3 (three) times daily.    . busPIRone (BUSPAR) 10 MG tablet Take 1 tablet (10 mg total) by mouth 3 (three) times daily. 90 tablet 2  . carvedilol (COREG) 12.5 MG tablet Take 1 tablet (12.5 mg total) by mouth 2 (two) times daily with a meal. (12.5 mg bid and Not 25 mg bid) 60 tablet 3  . dicyclomine (BENTYL) 20 MG tablet Take 20 mg by mouth 3 (three) times daily.     Marland Kitchen esomeprazole (NEXIUM) 20  MG capsule Take 20 mg by mouth daily at 12 noon.    . gabapentin (NEURONTIN) 300 MG capsule Take 1 capsule (300 mg total) by mouth 3 (three) times daily. 90 capsule 3  . hydrALAZINE (APRESOLINE) 100 MG tablet Take 1 tablet (100 mg total) by mouth 3 (three) times daily. 90 tablet 3  . insulin aspart protamine- aspart (NOVOLOG MIX 70/30) (70-30) 100 UNIT/ML injection Inject 60 Units into the skin 2 (two) times daily with a meal.    . isosorbide mononitrate (IMDUR) 30 MG 24 hr tablet Take 1 tablet (30 mg total) by mouth daily. 30 tablet 2  . potassium chloride SA (K-DUR) 20 MEQ tablet Take 20 mEq by mouth 2 (two) times daily.    . sertraline (ZOLOFT) 50 MG tablet Take 1 tablet (50 mg total) by mouth daily. 30 tablet 2  . torsemide (DEMADEX) 20 MG tablet Take 20 mg by mouth 2 (two) times daily.      No current facility-administered medications for this visit.    Allergies:   Benicar [olmesartan], Codeine, Sulfa antibiotics, and Trulicity [dulaglutide]    Social History:  The patient  reports that she is a non-smoker but has been exposed to tobacco smoke. She has  never used smokeless tobacco. She reports that she does not drink alcohol or use drugs.   Family History:  The patient's ***family history includes Cancer in an other family member; Diabetes in her father and sister; Heart failure in her father and sister; Hypertension in her father and sister; Stroke in her mother, sister, and sister.    ROS:  Please see the history of present illness.   Otherwise, review of systems are positive for {NONE DEFAULTED:18576::"none"}.   All other systems are reviewed and negative.    PHYSICAL EXAM: VS:  LMP 08/06/2012  , BMI There is no height or weight on file to calculate BMI. GENERAL:  Well appearing HEENT:  Pupils equal round and reactive, fundi not visualized, oral mucosa unremarkable NECK:  No jugular venous distention, waveform within normal limits, carotid upstroke brisk and symmetric, no bruits,  no thyromegaly LYMPHATICS:  No cervical, inguinal adenopathy LUNGS:  Clear to auscultation bilaterally BACK:  No CVA tenderness CHEST:  Unremarkable HEART:  PMI not displaced or sustained,S1 and S2 within normal limits, no S3, no S4, no clicks, no rubs, *** murmurs ABD:  Flat, positive bowel sounds normal in frequency in pitch, no bruits, no rebound, no guarding, no midline pulsatile mass, no hepatomegaly, no splenomegaly EXT:  2 plus pulses throughout, no edema, no cyanosis no clubbing SKIN:  No rashes no nodules NEURO:  Cranial nerves II through XII grossly intact, motor grossly intact throughout PSYCH:  Cognitively intact, oriented to person place and time    EKG:  EKG {ACTION; IS/IS GI:087931 ordered today. The ekg ordered today demonstrates ***   Recent Labs: 09/25/2019: TSH 2.772 09/26/2019: ALT 22 09/27/2019: B Natriuretic Peptide 42.0 09/28/2019: Magnesium 2.0 10/12/2019: BUN 15; Creatinine, Ser 1.06; Hemoglobin 15.1; Platelets 305; Potassium 4.0; Sodium 133    Lipid Panel No results found for: CHOL, TRIG, HDL, CHOLHDL, VLDL, LDLCALC, LDLDIRECT    Wt Readings from Last 3 Encounters:  12/21/19 292 lb (132.5 kg)  10/12/19 280 lb (127 kg)  09/27/19 291 lb 14.2 oz (132.4 kg)      Other studies Reviewed: Additional studies/ records that were reviewed today include: ***. Review of the above records demonstrates:  Please see elsewhere in the note.  ***   ASSESSMENT AND PLAN:   Chest pain:stress MPI was reassuring, and discussed with the patient. Despite multiple cardiovascular risk factors there was no evidence of ischemia. She is to continue her current medication regimen.  Chronic diastolic CHF:due to renal insufficiency, metolazone and lisinopril were discontinued by her primary care. Recent labs revealed a potassium of 3.0 with repeat labs completed this week, primary care physician is managing labs requesting a copy. No evidence of fluid overload at this  time.  Morbid obesity: she feels better knowing that her chest pain is not related to coronary disease that could be identified by MPI. She is determined now to begin an exercise program and weight loss. She is encouraged to do so.  Covid education:  ***    Current medicines are reviewed at length with the patient today.  The patient {ACTIONS; HAS/DOES NOT HAVE:19233} concerns regarding medicines.  The following changes have been made:  {PLAN; NO CHANGE:13088:s}  Labs/ tests ordered today include: *** No orders of the defined types were placed in this encounter.    Disposition:   FU with ***    Signed, Minus Breeding, MD  12/26/2019 12:44 PM    [facility]

## 2019-12-26 NOTE — ED Provider Notes (Signed)
[facility] EMERGENCY DEPARTMENT Provider Note   CSN: [id number] Arrival date & time: 12/26/19  1427     History Chief Complaint  Patient presents with  . Dizziness    [name] is a 59 y.o. female.  Pt reports she feel dizzy.  Pt states she has had problems with dizziness since her medications were changed.  Pt thinks carvedilol is causing her symptoms.  Pt is diabetic.    The history is provided by the patient. No language interpreter was used.  Dizziness Quality:  Lightheadedness Severity:  Moderate Onset quality:  Gradual Timing:  Constant Progression:  Worsening Chronicity:  New Relieved by:  Nothing Worsened by:  Nothing Ineffective treatments:  None tried Associated symptoms: no nausea   Risk factors: new medications   Risk factors: no anemia        Past Medical History:  Diagnosis Date  . Asthma   . COPD (chronic obstructive pulmonary disease) ([place])   . Depression   . Diabetes mellitus   . Diastolic dysfunction Q000111Q  . Hypertension   . Prolonged QT interval 05/14/2015    Patient Active Problem List   Diagnosis Date Noted  . OSA (obstructive sleep apnea) 12/21/2019  . Asthma 12/21/2019  . Diastolic dysfunction Q000111Q  . Prolonged QT interval 05/14/2015  . Palpitations 07/23/2014  . Generalized weakness 07/23/2014  . Diabetes mellitus ([place]) 07/22/2014  . Hypertension 07/22/2014  . Depression 07/22/2014    Past Surgical History:  Procedure Laterality Date  . CATARACT EXTRACTION    . CESAREAN SECTION    . CHOLECYSTECTOMY       OB History    Gravida  3   Para  2   Term  2   Preterm      AB  1   Living  2     SAB  1   TAB      Ectopic      Multiple      Live Births              Family History  Problem Relation Age of Onset  . Stroke Mother   . Diabetes Father   . Heart failure Father   . Hypertension Father   . Diabetes Sister   . Heart failure Sister   . Hypertension Sister   . Stroke Sister     . Cancer Other   . Stroke Sister     Social History   Tobacco Use  . Smoking status: Passive Smoke Exposure - Never Smoker  . Smokeless tobacco: Never Used  Substance Use Topics  . Alcohol use: No    Alcohol/week: 0.0 standard drinks  . Drug use: No    Home Medications Prior to Admission medications   Medication Sig Start Date End Date Taking? Authorizing Provider  albuterol (PROAIR HFA) 108 (90 BASE) MCG/ACT inhaler Inhale 2 puffs into the lungs every 6 (six) hours as needed for wheezing or shortness of breath.   Yes [provider]  amLODipine (NORVASC) 10 MG tablet Take 1 tablet (10 mg total) by mouth daily. 10/06/19  Yes Emokpae, Courage, MD  bethanechol (URECHOLINE) 25 MG tablet Take 25 mg by mouth every morning.  09/10/19  Yes [provider]  busPIRone (BUSPAR) 10 MG tablet Take 1 tablet (10 mg total) by mouth 3 (three) times daily. 10/05/19  Yes Emokpae, Courage, MD  carvedilol (COREG) 12.5 MG tablet Take 1 tablet (12.5 mg total) by mouth 2 (two) times daily with a meal. (  12.5 mg bid and Not 25 mg bid) 10/05/19  Yes Emokpae, Courage, MD  gabapentin (NEURONTIN) 100 MG capsule Take 200 mg by mouth daily. 12/05/19  Yes [provider]  hydrALAZINE (APRESOLINE) 100 MG tablet Take 1 tablet (100 mg total) by mouth 3 (three) times daily. 10/05/19  Yes Roxan Hockey, MD  hydrOXYzine (ATARAX/VISTARIL) 10 MG tablet Take 10 mg by mouth every 8 (eight) hours as needed for itching or anxiety.  11/22/19  Yes [provider]  insulin aspart protamine- aspart (NOVOLOG MIX 70/30) (70-30) 100 UNIT/ML injection Inject 60-80 Units into the skin See admin instructions. 80 units in the morning and 60 units in the evening   Yes [provider]  isosorbide mononitrate (IMDUR) 30 MG 24 hr tablet Take 1 tablet (30 mg total) by mouth daily. 10/05/19 10/04/20 Yes Emokpae, Courage, MD  pantoprazole (PROTONIX) 40 MG tablet Take 40 mg by mouth daily.  12/05/19   Yes [provider]  potassium chloride SA (K-DUR) 20 MEQ tablet Take 20 mEq by mouth 2 (two) times daily.   Yes [provider]  sertraline (ZOLOFT) 100 MG tablet Take 100 mg by mouth daily. 12/19/19  Yes [provider]  torsemide (DEMADEX) 20 MG tablet Take 20 mg by mouth 2 (two) times daily.  03/28/18  Yes [provider]  ALPRAZolam Duanne Moron) 0.5 MG tablet Take 1 tablet (0.5 mg total) by mouth 2 (two) times daily as needed for anxiety or sleep. Patient not taking: Reported on 12/21/2019 10/05/19 10/04/20  Roxan Hockey, MD  gabapentin (NEURONTIN) 300 MG capsule Take 1 capsule (300 mg total) by mouth 3 (three) times daily. Patient not taking: Reported on 12/26/2019 10/05/19   Roxan Hockey, MD  sertraline (ZOLOFT) 50 MG tablet Take 1 tablet (50 mg total) by mouth daily. Patient not taking: Reported on 12/26/2019 10/05/19   Roxan Hockey, MD    Allergies    Benicar [olmesartan], Codeine, Sulfa antibiotics, and Trulicity [dulaglutide]  Review of Systems   Review of Systems  Gastrointestinal: Negative for nausea.  Neurological: Positive for dizziness.  All other systems reviewed and are negative.   Physical Exam Updated Vital Signs BP 124/62   Pulse 79   Temp 98.2 F (36.8 C) (Oral)   Resp (!) 21   Wt 132.5 kg   LMP 08/06/2012   SpO2 97%   BMI 55.19 kg/m   Physical Exam Vitals and nursing note reviewed.  Constitutional:      Appearance: She is well-developed.  HENT:     Head: Normocephalic.     Mouth/Throat:     Mouth: Mucous membranes are moist.  Eyes:     Extraocular Movements: Extraocular movements intact.     Pupils: Pupils are equal, round, and reactive to light.  Cardiovascular:     Rate and Rhythm: Normal rate.  Pulmonary:     Effort: Pulmonary effort is normal.  Abdominal:     General: Abdomen is flat. There is no distension.  Musculoskeletal:        General: Normal range of motion.     Cervical back: Normal range of  motion.  Skin:    General: Skin is warm.  Neurological:     Mental Status: She is alert and oriented to person, place, and time.  Psychiatric:        Mood and Affect: Mood normal.     ED Results / Procedures / Treatments   Labs (all labs ordered are listed, but only abnormal results are displayed)  Labs Reviewed  CBC WITH DIFFERENTIAL/PLATELET - Abnormal; Notable for the following components:      Result Value   RBC 5.39 (*)    Hemoglobin 15.1 (*)    HCT 46.2 (*)    All other components within normal limits  BASIC METABOLIC PANEL - Abnormal; Notable for the following components:   Potassium 3.2 (*)    Glucose, Bld 68 (*)    All other components within normal limits  CBG MONITORING, ED - Abnormal; Notable for the following components:   Glucose-Capillary 38 (*)    All other components within normal limits  CBG MONITORING, ED - Abnormal; Notable for the following components:   Glucose-Capillary 101 (*)    All other components within normal limits  HEPATIC FUNCTION PANEL  URINALYSIS, ROUTINE W REFLEX MICROSCOPIC  CBG MONITORING, ED  TROPONIN I (HIGH SENSITIVITY)    EKG EKG Interpretation  Date/Time:  Wednesday December 26 2019 16:35:02 EST Ventricular Rate:  82 PR Interval:    QRS Duration: 99 QT Interval:  433 QTC Calculation: 506 R Axis:   -40 Text Interpretation: Sinus rhythm Left anterior fascicular block Low voltage, precordial leads Consider anterior infarct Confirmed by Fredia Sorrow [phone]) on 12/26/2019 4:42:56 PM   Radiology CT Head Wo Contrast  Result Date: 12/26/2019 CLINICAL DATA:  Vertigo. EXAM: CT HEAD WITHOUT CONTRAST TECHNIQUE: Contiguous axial images were obtained from the base of the skull through the vertex without intravenous contrast. COMPARISON:  February 23, 2019 FINDINGS: Brain: There is mild cerebral atrophy with widening of the extra-axial spaces and ventricular dilatation. There are areas of decreased attenuation within the white matter tracts  of the supratentorial brain, consistent with microvascular disease changes. Vascular: No hyperdense vessel or unexpected calcification. Skull: Normal. Negative for fracture or focal lesion. Sinuses/Orbits: No acute finding. Other: None. IMPRESSION: No acute intracranial pathology. Electronically Signed   By: Virgina Norfolk M.D.   On: 12/26/2019 18:19   DG Chest Port 1 View  Result Date: 12/26/2019 CLINICAL DATA:  Weakness EXAM: PORTABLE CHEST 1 VIEW COMPARISON:  09/25/2019 FINDINGS: Mild cardiomegaly with central vascular congestion. No consolidation, pleural effusion, or pneumothorax. IMPRESSION: Cardiomegaly with central vascular congestion. Electronically Signed   By: Donavan Foil M.D.   On: 12/26/2019 16:38    Procedures Procedures (including critical care time)  Medications Ordered in ED Medications  potassium chloride SA (KLOR-CON) CR tablet 40 mEq (has no administration in time range)    ED Course  I have reviewed the triage vital signs and the nursing notes.  Pertinent labs & imaging results that were available during my care of the patient were reviewed by me and considered in my medical decision making (see chart for details).    MDM Rules/Calculators/A&P                      MDM  Pt has potassium of 3.2.  Pt given kdur 40 meq here.  Pt advised to try stopping coreg to see if her symptoms improve.  Pt advised she will need to see her Md for recheck in 2 -3 days  Final Clinical Impression(s) / ED Diagnoses Final diagnoses:  Dizziness  Lightheadedness  Hypokalemia    Rx / DC Orders ED Discharge Orders         Ordered    potassium chloride 20 MEQ TBCR  Daily     12/26/19 2159        An After Visit Summary was printed and given to the patient.  Sidney Ace 12/26/19 2207    Fredia Sorrow, MD 12/28/19 [phone]

## 2019-12-26 NOTE — Discharge Instructions (Addendum)
Return if any problems.  See your Physician for recheck in 2-3 days.  Do not take evening dosage of coreg and see if this improves your symptoms.  If you feel better without taking it, call your physician to discuss stopping or changing your medication

## 2019-12-26 NOTE — ED Notes (Addendum)
Pt with c/o HA, flushed face and nausea.

## 2019-12-26 NOTE — Telephone Encounter (Signed)
Pt called office to report that she was at [facility] parking lot. Pt states that she is unable to walk into the office d/t chest pain, headache and SOB. Pt in car with husband. [place] office staff asked to assist pt until EMS arrived. EMS called. DOD notified

## 2019-12-26 NOTE — ED Notes (Addendum)
Pt given orange juice, Apple juice x2, peanut butter crackers, and a bag sandwich.  Critical low CBG of 38 Magda Paganini PA and RN Johny Chess made aware

## 2020-01-08 ENCOUNTER — Ambulatory Visit: Payer: BC Managed Care – PPO | Admitting: Cardiology

## 2020-01-09 ENCOUNTER — Ambulatory Visit: Payer: BC Managed Care – PPO | Admitting: Cardiology

## 2020-01-29 DIAGNOSIS — Z7189 Other specified counseling: Secondary | ICD-10-CM | POA: Insufficient documentation

## 2020-01-29 NOTE — Progress Notes (Signed)
Cardiology Office Note   Date:  01/30/2020   ID:  ANNISE [name], DOB [birth year], MRN [id number]  PCP:  Bridget Hartshorn, NP  Cardiologist:   No primary care provider on file. Referring:  Bridget Hartshorn, NP  No chief complaint on file.     History of Present Illness: [name] is a 59 y.o. female who is referred by Bridget Hartshorn, NP for evaluation of palpations.  She was seen in our practice last in 2017 for evaluation of chronic diastolic heart failure and history of prolonged QT interval in the setting of symptomatic hypokalemia.  She has dyspnea and COPD.  She had a negative perfusion study in 2017.  She was in the ED with dizziness in Jan.  I reviewed these records for this visit.   There was no clear etiology but she was advised to stop Coreg to see if her symptoms improved.    She said that since she carvedilol she has had no further episodes.  She was getting some chest discomfort but this was evaluated as above.  She has had this is A pulling discomfort that may be associated with certain movements.  She has occasional dizzy spells that she says have been described to vertigo.  She was having the palpitations a couple times per week lasting for a few seconds at a time.  However, this resolved.  She is not particularly active.  She does climb the stairs and she has to rest going up the stairs.  She is seeing pulmonary being managed for possible sleep apnea.  She has a history of COPD and asthma.  She has been on a diet for management of her morbid obesity.  She is not overtly active however.  Past Medical History:  Diagnosis Date  . Asthma   . COPD (chronic obstructive pulmonary disease) ([place])   . Depression   . Diabetes mellitus   . Diastolic dysfunction Q000111Q  . Hypertension   . Prolonged QT interval 05/14/2015    Past Surgical History:  Procedure Laterality Date  . CATARACT EXTRACTION    . CESAREAN SECTION    . CHOLECYSTECTOMY        Current Outpatient Medications  Medication Sig Dispense Refill  . albuterol (PROAIR HFA) 108 (90 BASE) MCG/ACT inhaler Inhale 2 puffs into the lungs every 6 (six) hours as needed for wheezing or shortness of breath.    . ALPRAZolam (XANAX) 0.5 MG tablet Take 1 tablet (0.5 mg total) by mouth 2 (two) times daily as needed for anxiety or sleep. 12 tablet 0  . bethanechol (URECHOLINE) 25 MG tablet Take 25 mg by mouth every morning.     Marland Kitchen esomeprazole (NEXIUM) 20 MG capsule Take 20 mg by mouth 2 (two) times daily before a meal.    . gabapentin (NEURONTIN) 100 MG capsule Take 100 mg by mouth 2 (two) times daily.    . hydrOXYzine (ATARAX/VISTARIL) 10 MG tablet Take 10 mg by mouth every 8 (eight) hours as needed for itching or anxiety.     . insulin aspart protamine- aspart (NOVOLOG MIX 70/30) (70-30) 100 UNIT/ML injection Inject 60-80 Units into the skin See admin instructions. 80 units in the morning and 60 units in the evening    . potassium chloride SA (K-DUR) 20 MEQ tablet Take 20 mEq by mouth 2 (two) times daily.    . propranolol ER (INDERAL LA) 80 MG 24 hr capsule Take 80 mg by mouth daily.    Marland Kitchen  sertraline (ZOLOFT) 100 MG tablet Take 100 mg by mouth daily.    Marland Kitchen torsemide (DEMADEX) 20 MG tablet Take 20 mg by mouth 2 (two) times daily.     . traZODone (DESYREL) 50 MG tablet Take 50 mg by mouth at bedtime.     No current facility-administered medications for this visit.    Allergies:   Carvedilol, Benicar [olmesartan], Codeine, Sulfa antibiotics, and Trulicity [dulaglutide]    Social History:  The patient  reports that she is a non-smoker but has been exposed to tobacco smoke. She has never used smokeless tobacco. She reports that she does not drink alcohol or use drugs.   Family History:  The patient's family history includes Cancer in an other family member; Diabetes in her father and sister; Heart failure in her father and sister; Hypertension in her father and sister; Stroke in her mother,  sister, and sister.   Of note her daughter died at age 61.  She had congenital heart disease in the transplanted age 52.   ROS:  Please see the history of present illness.   Otherwise, review of systems are positive for none.   All other systems are reviewed and negative.    PHYSICAL EXAM: VS:  BP 130/78   Pulse 80   Ht 5\' 1"  (1.549 m)   Wt 285 lb (129.3 kg)   LMP 08/06/2012   BMI 53.85 kg/m  , BMI Body mass index is 53.85 kg/m. GENERAL:  Well appearing HEENT:  Pupils equal round and reactive, fundi not visualized, oral mucosa unremarkable NECK:  No jugular venous distention, waveform within normal limits, carotid upstroke brisk and symmetric, no bruits, no thyromegaly LYMPHATICS:  No cervical, inguinal adenopathy LUNGS:  Clear to auscultation bilaterally BACK:  No CVA tenderness CHEST:  Unremarkable HEART:  PMI not displaced or sustained,S1 and S2 within normal limits, no S3, no S4, no clicks, no rubs, no murmurs ABD:  Flat, positive bowel sounds normal in frequency in pitch, no bruits, no rebound, no guarding, no midline pulsatile mass, no hepatomegaly, no splenomegaly EXT:  2 plus pulses throughout, no edema, no cyanosis no clubbing SKIN:  No rashes no nodules NEURO:  Cranial nerves II through XII grossly intact, motor grossly intact throughout PSYCH:  Cognitively intact, oriented to person place and time    EKG:  EKG is not ordered today. The ekg ordered 12/26/19 demonstrates sinus rhythm, rate 82, left axis deviation, poor anterior R wave progression, no acute ST-T wave changes.    Recent Labs: 09/25/2019: TSH 2.772 09/27/2019: B Natriuretic Peptide 42.0 09/28/2019: Magnesium 2.0 12/26/2019: ALT 17; BUN 17; Creatinine, Ser 0.85; Hemoglobin 15.1; Platelets 307; Potassium 3.2; Sodium 141    Lipid Panel No results found for: CHOL, TRIG, HDL, CHOLHDL, VLDL, LDLCALC, LDLDIRECT    Wt Readings from Last 3 Encounters:  01/30/20 285 lb (129.3 kg)  12/26/19 292 lb 1.8 oz  (132.5 kg)  12/21/19 292 lb (132.5 kg)      Other studies Reviewed: Additional studies/ records that were reviewed today include: ED records. Review of the above records demonstrates:  Please see elsewhere in the note.     ASSESSMENT AND PLAN:  PALPITATIONS: These are not problematic anymore.  No further change in therapy.  No further work-up.  Of note I do see that her potassium was low in the emergency room but she says it has been checked since and was found to be normal.  DM: A1c was 10.2 but she is working with an endocrinologist  and is now on a diet.  No change in therapy.  HTN: Her blood pressure is well controlled.  No change in therapy.  MORBID OBESITY: She has been counseled about weight loss with diet and exercise.  LONG QT: Her QT was not elevated at the most recent EKG is mild.  Further work-up.  DYSPNEA: This is probably multifactorial.  She had a negative perfusion study a few years ago.  I do not suspect ischemia.  She does not seem to be particularly volume overloaded.  This is probably primary pulmonary along with weight and deconditioning.  No further work-up.    Current medicines are reviewed at length with the patient today.  The patient does not have concerns regarding medicines.  The following changes have been made:  no change  Labs/ tests ordered today include: None No orders of the defined types were placed in this encounter.    Disposition:   FU with me as needed.     Signed, Minus Breeding, MD  01/30/2020 3:16 PM    [facility]

## 2020-01-30 ENCOUNTER — Ambulatory Visit (INDEPENDENT_AMBULATORY_CARE_PROVIDER_SITE_OTHER): Payer: BC Managed Care – PPO | Admitting: Cardiology

## 2020-01-30 ENCOUNTER — Other Ambulatory Visit: Payer: Self-pay

## 2020-01-30 ENCOUNTER — Encounter: Payer: Self-pay | Admitting: Cardiology

## 2020-01-30 VITALS — BP 130/78 | HR 80 | Ht 61.0 in | Wt 285.0 lb

## 2020-01-30 DIAGNOSIS — Z7189 Other specified counseling: Secondary | ICD-10-CM

## 2020-01-30 DIAGNOSIS — E118 Type 2 diabetes mellitus with unspecified complications: Secondary | ICD-10-CM | POA: Diagnosis not present

## 2020-01-30 DIAGNOSIS — I10 Essential (primary) hypertension: Secondary | ICD-10-CM

## 2020-01-30 DIAGNOSIS — Z794 Long term (current) use of insulin: Secondary | ICD-10-CM

## 2020-01-30 DIAGNOSIS — R002 Palpitations: Secondary | ICD-10-CM | POA: Diagnosis not present

## 2020-01-30 NOTE — Patient Instructions (Signed)
Medication Instructions:  The current medical regimen is effective;  continue present plan and medications.  *If you need a refill on your cardiac medications before your next appointment, please call your pharmacy*  Follow-Up:  As needed with Dr Percival Spanish.  Thank you for choosing [facility]!!

## 2020-02-04 ENCOUNTER — Ambulatory Visit: Payer: BC Managed Care – PPO

## 2020-02-04 ENCOUNTER — Other Ambulatory Visit: Payer: Self-pay

## 2020-02-04 DIAGNOSIS — G4733 Obstructive sleep apnea (adult) (pediatric): Secondary | ICD-10-CM

## 2020-02-11 ENCOUNTER — Telehealth: Payer: Self-pay | Admitting: Pulmonary Disease

## 2020-02-11 DIAGNOSIS — G4733 Obstructive sleep apnea (adult) (pediatric): Secondary | ICD-10-CM

## 2020-02-11 NOTE — Telephone Encounter (Signed)
HST showed severe OSA 49/hour Send prescription to DME for auto CPAP 5 to 15 cm, mask of choice, humidity  Office visit with me in 6 weeks

## 2020-02-15 NOTE — Telephone Encounter (Signed)
Called the patient and made her aware of the results. Patient voiced understanding and has been set up for a video visit on 04/04/20 at 2:00 with Rexene Edison, NP.  Order for CPAP machine placed. Nothing further needed at this time.

## 2020-04-04 ENCOUNTER — Telehealth: Payer: BC Managed Care – PPO | Admitting: Adult Health

## 2020-04-09 ENCOUNTER — Telehealth: Payer: Self-pay | Admitting: Adult Health

## 2020-04-09 NOTE — Telephone Encounter (Signed)
Pt is scheduled for a video visit tomorrow with Tammy for CPAP compliance. Checked [employer], there isn't any data listed. Called and left a message with the pt to see if she has been using her CPAP or to see if she even has one.

## 2020-04-09 NOTE — Telephone Encounter (Signed)
LMTCB

## 2020-04-09 NOTE — Telephone Encounter (Signed)
Patient states has CPAP machine. Patient has been using CPAP machine. Patient phone number is [phone].

## 2020-04-10 ENCOUNTER — Telehealth (INDEPENDENT_AMBULATORY_CARE_PROVIDER_SITE_OTHER): Payer: BC Managed Care – PPO | Admitting: Adult Health

## 2020-04-10 DIAGNOSIS — G4733 Obstructive sleep apnea (adult) (pediatric): Secondary | ICD-10-CM

## 2020-04-10 DIAGNOSIS — J4520 Mild intermittent asthma, uncomplicated: Secondary | ICD-10-CM | POA: Diagnosis not present

## 2020-04-10 NOTE — Patient Instructions (Signed)
Keep up the good work Continue to wear CPAP each night Work on healthy weight loss Do not drive if sleepy We will reach out to homecare company for [place] card and get CPAP download May use albuterol as needed Follow-up in 4-6 months and As needed

## 2020-04-10 NOTE — Progress Notes (Signed)
Virtual Visit via Telemedicine  Note  I connected with [name] on 04/10/20 at 11:30 AM EDT by a  telemedicine application and verified that I am speaking with the correct person using two identifiers.  Location: Patient: Home  Provider: Home    I discussed the limitations of evaluation and management by telemedicine and the availability of in person appointments. The patient expressed understanding and agreed to proceed.  History of Present Illness: 59 year old female seen for pulmonary consult January 2021 for obstructive sleep apnea and Mild intermittent asthma  Medical history significant for morbid obesity, type 1 diabetes, chronic diastolic heart failure   Today's video visit is a 48-month follow-up.  Last visit patient was seen for a sleep consult.  She previously been diagnosed with sleep apnea but was not using CPAP.  Patient has multiple risk factors.  She was set up for home sleep study that was done March 2021 that showed severe sleep apnea with AHI at 49/hour.  She was started on CPAP At bedtime  . Says she is trying to get used to it. Says the headgear is not very comfortable.  Wears each night for about 8 hrs . Feels that it is helping , felt she has more energy .  Does not have Internet.service . Was not able to get download .   Has mild asthma, uses albuterol rarely. Says breathing is doing well.   Sings gospel music and plays piano.  Can sing without any issues.  Feels her CPAP has helped with her breathing as well, feels she is breathing better.     Observations/Objective: HST 02/2020 HST showed severe OSA 49/hour  Assessment and Plan: OSA -patient seems to be off to a good start with her CPAP.  She has perceived clinical benefit.  We will need to get a CPAP download.  We will reach out to the homecare company for an [place]   Mild intermittent asthma under good control  Plan  Patient Instructions  Keep up the good work Continue to wear CPAP each  night Work on healthy weight loss Do not drive if sleepy We will reach out to homecare company for [place] card and get CPAP download May use albuterol as needed Follow-up in 4-6 months and As needed        Follow Up Instructions: Follow-up in 4 to 6 months and as needed   I discussed the assessment and treatment plan with the patient. The patient was provided an opportunity to ask questions and all were answered. The patient agreed with the plan and demonstrated an understanding of the instructions.   The patient was advised to call back or seek an in-person evaluation if the symptoms worsen or if the condition fails to improve as anticipated.  I provided 22 minutes of non-face-to-face time during this encounter.   Rexene Edison, NP

## 2020-04-11 NOTE — Addendum Note (Signed)
Addended by: Desmond Dike C on: 04/11/2020 10:00 AM   Modules accepted: Orders

## 2020-04-13 DIAGNOSIS — G4733 Obstructive sleep apnea (adult) (pediatric): Secondary | ICD-10-CM | POA: Diagnosis not present

## 2020-04-14 NOTE — Telephone Encounter (Signed)
OV was taken care of. Nothing further needed.

## 2020-04-17 DIAGNOSIS — I152 Hypertension secondary to endocrine disorders: Secondary | ICD-10-CM | POA: Diagnosis not present

## 2020-04-17 DIAGNOSIS — G4730 Sleep apnea, unspecified: Secondary | ICD-10-CM | POA: Diagnosis not present

## 2020-04-17 DIAGNOSIS — E1159 Type 2 diabetes mellitus with other circulatory complications: Secondary | ICD-10-CM | POA: Diagnosis not present

## 2020-05-14 DIAGNOSIS — G4733 Obstructive sleep apnea (adult) (pediatric): Secondary | ICD-10-CM | POA: Diagnosis not present

## 2020-05-16 DIAGNOSIS — E1159 Type 2 diabetes mellitus with other circulatory complications: Secondary | ICD-10-CM | POA: Diagnosis not present

## 2020-05-16 DIAGNOSIS — B3789 Other sites of candidiasis: Secondary | ICD-10-CM | POA: Diagnosis not present

## 2020-05-16 DIAGNOSIS — E1149 Type 2 diabetes mellitus with other diabetic neurological complication: Secondary | ICD-10-CM | POA: Diagnosis not present

## 2020-05-16 DIAGNOSIS — E1140 Type 2 diabetes mellitus with diabetic neuropathy, unspecified: Secondary | ICD-10-CM | POA: Diagnosis not present

## 2020-05-27 DIAGNOSIS — Z0000 Encounter for general adult medical examination without abnormal findings: Secondary | ICD-10-CM | POA: Diagnosis not present

## 2020-05-27 DIAGNOSIS — E1040 Type 1 diabetes mellitus with diabetic neuropathy, unspecified: Secondary | ICD-10-CM | POA: Diagnosis not present

## 2020-05-27 DIAGNOSIS — E1159 Type 2 diabetes mellitus with other circulatory complications: Secondary | ICD-10-CM | POA: Diagnosis not present

## 2020-05-27 DIAGNOSIS — E039 Hypothyroidism, unspecified: Secondary | ICD-10-CM | POA: Diagnosis not present

## 2020-05-27 DIAGNOSIS — Z124 Encounter for screening for malignant neoplasm of cervix: Secondary | ICD-10-CM | POA: Diagnosis not present

## 2020-05-27 DIAGNOSIS — I152 Hypertension secondary to endocrine disorders: Secondary | ICD-10-CM | POA: Diagnosis not present

## 2020-08-27 DIAGNOSIS — J029 Acute pharyngitis, unspecified: Secondary | ICD-10-CM | POA: Diagnosis not present

## 2020-08-27 DIAGNOSIS — R0981 Nasal congestion: Secondary | ICD-10-CM | POA: Diagnosis not present

## 2020-08-27 DIAGNOSIS — E1040 Type 1 diabetes mellitus with diabetic neuropathy, unspecified: Secondary | ICD-10-CM | POA: Diagnosis not present

## 2020-08-28 DIAGNOSIS — R0981 Nasal congestion: Secondary | ICD-10-CM | POA: Diagnosis not present

## 2020-08-28 DIAGNOSIS — J029 Acute pharyngitis, unspecified: Secondary | ICD-10-CM | POA: Diagnosis not present

## 2020-09-10 DIAGNOSIS — E1159 Type 2 diabetes mellitus with other circulatory complications: Secondary | ICD-10-CM | POA: Diagnosis not present

## 2020-09-10 DIAGNOSIS — R8290 Unspecified abnormal findings in urine: Secondary | ICD-10-CM | POA: Diagnosis not present

## 2020-09-10 DIAGNOSIS — M793 Panniculitis, unspecified: Secondary | ICD-10-CM | POA: Diagnosis not present

## 2020-09-10 DIAGNOSIS — E1040 Type 1 diabetes mellitus with diabetic neuropathy, unspecified: Secondary | ICD-10-CM | POA: Diagnosis not present

## 2020-09-10 DIAGNOSIS — L03311 Cellulitis of abdominal wall: Secondary | ICD-10-CM | POA: Diagnosis not present

## 2020-09-10 DIAGNOSIS — I152 Hypertension secondary to endocrine disorders: Secondary | ICD-10-CM | POA: Diagnosis not present

## 2020-09-24 DIAGNOSIS — I152 Hypertension secondary to endocrine disorders: Secondary | ICD-10-CM | POA: Diagnosis not present

## 2020-09-24 DIAGNOSIS — E039 Hypothyroidism, unspecified: Secondary | ICD-10-CM | POA: Diagnosis not present

## 2020-09-24 DIAGNOSIS — E1040 Type 1 diabetes mellitus with diabetic neuropathy, unspecified: Secondary | ICD-10-CM | POA: Diagnosis not present

## 2020-09-24 DIAGNOSIS — E1159 Type 2 diabetes mellitus with other circulatory complications: Secondary | ICD-10-CM | POA: Diagnosis not present

## 2020-09-24 DIAGNOSIS — K219 Gastro-esophageal reflux disease without esophagitis: Secondary | ICD-10-CM | POA: Diagnosis not present

## 2020-10-09 ENCOUNTER — Other Ambulatory Visit: Payer: Self-pay

## 2020-10-09 ENCOUNTER — Encounter (HOSPITAL_BASED_OUTPATIENT_CLINIC_OR_DEPARTMENT_OTHER): Payer: Medicare Other | Attending: Internal Medicine | Admitting: Internal Medicine

## 2020-10-09 DIAGNOSIS — L97822 Non-pressure chronic ulcer of other part of left lower leg with fat layer exposed: Secondary | ICD-10-CM | POA: Diagnosis not present

## 2020-10-09 DIAGNOSIS — Z794 Long term (current) use of insulin: Secondary | ICD-10-CM | POA: Diagnosis not present

## 2020-10-09 DIAGNOSIS — L304 Erythema intertrigo: Secondary | ICD-10-CM | POA: Diagnosis not present

## 2020-10-09 DIAGNOSIS — L97221 Non-pressure chronic ulcer of left calf limited to breakdown of skin: Secondary | ICD-10-CM | POA: Insufficient documentation

## 2020-10-09 DIAGNOSIS — L02214 Cutaneous abscess of groin: Secondary | ICD-10-CM | POA: Diagnosis not present

## 2020-10-09 DIAGNOSIS — E10622 Type 1 diabetes mellitus with other skin ulcer: Secondary | ICD-10-CM | POA: Diagnosis not present

## 2020-10-09 DIAGNOSIS — B3789 Other sites of candidiasis: Secondary | ICD-10-CM | POA: Diagnosis not present

## 2020-10-09 DIAGNOSIS — I890 Lymphedema, not elsewhere classified: Secondary | ICD-10-CM | POA: Diagnosis not present

## 2020-10-10 NOTE — Progress Notes (Signed)
[name] ([id number]) Visit Report for 10/09/2020 Allergy List Details Patient Name: Date of Service: [facility] [place], [name] Teresa B. 10/09/2020 9:00 A M Medical Record Number: [id number] Patient Account Number: [id number] Date of Birth/Sex: Treating RN: 10-Dec-1960 (59 y.o. Orvan Falconer Primary Care Shaleka Brines: Freda Munro Other Clinician: Referring Flavius Repsher: Treating Derik Fults/Extender: [name], KA THERINE Weeks in Treatment: 0 Allergies Active Allergies carvedilol Benicar Celexa Sulfa (Sulfonamide Antibiotics) Trulicity codeine Allergy Notes Electronic Signature(s) Signed: 10/10/2020 5:00:24 PM By: Carlene Coria RN Entered By: Carlene Coria on 10/09/2020 08:56:43 -------------------------------------------------------------------------------- Arrival Information Details Patient Name: Date of Service: [name], [name] Teresa B. 10/09/2020 9:00 A M Medical Record Number: [id number] Patient Account Number: [id number] Date of Birth/Sex: Treating RN: 08-27-1961 (59 y.o. Orvan Falconer Primary Care Lynisha Osuch: Freda Munro Other Clinician: Referring Jashaun Penrose: Treating Kadi Hession/Extender: [name], [name] in Treatment: 0 Visit Information Patient Arrived: Ambulatory Arrival Time: 08:53 Accompanied By: self Transfer Assistance: None Patient Identification Verified: Yes Secondary Verification Process Completed: Yes Patient Requires Transmission-Based Precautions: No Patient Has Alerts: No Electronic Signature(s) Signed: 10/10/2020 5:00:24 PM By: Carlene Coria RN Entered By: Carlene Coria on 10/09/2020 08:54:23 -------------------------------------------------------------------------------- Clinic Level of Care Assessment Details Patient Name: Date of Service: [facility] [place], [name] Teresa B. 10/09/2020 9:00 A M Medical Record Number: [id number] Patient Account Number: [id number] Date of Birth/Sex: Treating RN: Nov 07, 1961 (59 y.o. [name] Primary Care Jacobey Gura: Freda Munro Other Clinician: Referring Mahmud Keithly: Treating Florance Paolillo/Extender: [name], [name] in Treatment: 0 Clinic Level of Care Assessment Items TOOL 1 Quantity Score X- 1 0 Use when EandM and Procedure is performed on INITIAL visit ASSESSMENTS - Nursing Assessment / Reassessment X- 1 20 General Physical Exam (combine w/ comprehensive assessment (listed just below) when performed on new pt. evals) X- 1 25 Comprehensive Assessment (HX, ROS, Risk Assessments, Wounds Hx, etc.) ASSESSMENTS - Wound and Skin Assessment / Reassessment X- 1 10 Dermatologic / Skin Assessment (not related to wound area) ASSESSMENTS - Ostomy and/or Continence Assessment and Care '[]'  - 0 Incontinence Assessment and Management '[]'  - 0 Ostomy Care Assessment and Management (repouching, etc.) PROCESS - Coordination of Care '[]'  - 0 Simple Patient / Family Education for ongoing care X- 1 20 Complex (extensive) Patient / Family Education for ongoing care X- 1 10 Staff obtains Programmer, systems, Records, T Results / Process Orders est '[]'  - 0 Staff telephones HHA, Nursing Homes / Clarify orders / etc '[]'  - 0 Routine Transfer to another Facility (non-emergent condition) '[]'  - 0 Routine Hospital Admission (non-emergent condition) X- 1 15 New Admissions / Biomedical engineer / Ordering NPWT Apligraf, etc. , '[]'  - 0 Emergency Hospital Admission (emergent condition) PROCESS - Special Needs '[]'  - 0 Pediatric / Minor Patient Management '[]'  - 0 Isolation Patient Management '[]'  - 0 Hearing / Language / Visual special needs '[]'  - 0 Assessment of Community assistance (transportation, D/C planning, etc.) '[]'  - 0 Additional assistance / Altered mentation '[]'  - 0 Support Surface(s) Assessment (bed, cushion, seat, etc.) INTERVENTIONS - Miscellaneous '[]'  - 0 External ear exam '[]'  - 0 Patient Transfer (multiple staff / Civil Service fast streamer / Similar devices) '[]'  -  0 Simple Staple / Suture removal (25 or less) '[]'  - 0 Complex Staple / Suture removal (26 or more) '[]'  - 0 Hypo/Hyperglycemic Management (do not check if billed separately) X- 1 15 Ankle / Brachial Index (ABI) - do not check if billed separately Has the patient  been seen at the hospital within the last three years: Yes Total Score: 115 Level Of Care: New/Established - Level 3 Electronic Signature(s) Signed: 10/09/2020 6:08:00 PM By: Deon Pilling Entered By: Deon Pilling on 10/09/2020 10:18:51 -------------------------------------------------------------------------------- Compression Therapy Details Patient Name: Date of Service: [facility], [name] Teresa B. 10/09/2020 9:00 A M Medical Record Number: [id number] Patient Account Number: [id number] Date of Birth/Sex: Treating RN: 03-18-1961 (59 y.o. [name] Primary Care Maven Rosander: Freda Munro Other Clinician: Referring Ertha Nabor: Treating Mikolaj Woolstenhulme/Extender: [name], [name] in Treatment: 0 Compression Therapy Performed for Wound Assessment: Wound #1 Left,Posterior Lower Leg Performed By: Clinician Levan Hurst, RN Compression Type: Three Layer Post Procedure Diagnosis Same as Pre-procedure Electronic Signature(s) Signed: 10/09/2020 6:08:00 PM By: Deon Pilling Entered By: Deon Pilling on 10/09/2020 10:18:27 -------------------------------------------------------------------------------- Encounter Discharge Information Details Patient Name: Date of Service: [facility], [name] Teresa B. 10/09/2020 9:00 A M Medical Record Number: [id number] Patient Account Number: [id number] Date of Birth/Sex: Treating RN: 02-Jun-1961 (59 y.o. [name] Primary Care Anacleto Batterman: Freda Munro Other Clinician: Referring Terika Pillard: Treating Aurelius Gildersleeve/Extender: [name], [name] in Treatment: 0 Encounter Discharge Information Items Discharge Condition: Stable Ambulatory Status:  Ambulatory Discharge Destination: Home Transportation: Private Auto Accompanied By: self Schedule Follow-up Appointment: Yes Clinical Summary of Care: Patient Declined Electronic Signature(s) Signed: 10/09/2020 5:54:34 PM By: Baruch Gouty RN, BSN Entered By: Baruch Gouty on 10/09/2020 10:44:11 -------------------------------------------------------------------------------- Lower Extremity Assessment Details Patient Name: Date of Service: [facility] [place], [name] Teresa B. 10/09/2020 9:00 A M Medical Record Number: [id number] Patient Account Number: [id number] Date of Birth/Sex: Treating RN: 08-10-1961 (59 y.o. Orvan Falconer Primary Care Mandi Mattioli: Other Clinician: Freda Munro Referring Sumiko Ceasar: Treating Yee Joss/Extender: [name], [name] in Treatment: 0 Edema Assessment Assessed: [Left: No] [Right: No] E[Left: dema] [Right: :] Calf Left: Right: Point of Measurement: 41 cm From Medial Instep 44.5 cm Ankle Left: Right: Point of Measurement: 11 cm From Medial Instep 22.5 cm Notes non compressable left Electronic Signature(s) Signed: 10/10/2020 5:00:24 PM By: Carlene Coria RN Entered By: Carlene Coria on 10/09/2020 09:25:37 -------------------------------------------------------------------------------- Multi Wound Chart Details Patient Name: Date of Service: [facility], [place] Teresa B. 10/09/2020 9:00 A M Medical Record Number: [id number] Patient Account Number: [id number] Date of Birth/Sex: Treating RN: 1960/12/24 (59 y.o. Helene Shoe, [name] Primary Care Eriberto Felch: Freda Munro Other Clinician: Referring Loyce Flaming: Treating Jacqui Headen/Extender: [name], [name] in Treatment: 0 Vital Signs Height(in): 61 Pulse(bpm): 41 Weight(lbs): 300 Blood Pressure(mmHg): 160/86 Body Mass Index(BMI): 57 Temperature(F): 98.4 Respiratory Rate(breaths/min): 18 Photos: [1:No Photos Left, Posterior Lower Leg] [2:No Photos Groin]  [N/A:N/A N/A] Wound Location: [1:Gradually Appeared] [2:Gradually Appeared] [N/A:N/A] Wounding Event: [1:Diabetic Wound/Ulcer of the Lower] [2:Fungal] [N/A:N/A] Primary Etiology: [1:Extremity Asthma, Sleep Apnea, Congestive] [2:Asthma, Sleep Apnea, Congestive] [N/A:N/A] Comorbid History: [1:Heart Failure, Coronary Artery Disease, Hypertension, Type I Diabetes, Neuropathy 09/05/2020] [2:Heart Failure, Coronary Artery Disease, Hypertension, Type I Diabetes, Neuropathy 09/05/2020] [N/A:N/A] Date Acquired: [1:0] [2:0] [N/A:N/A] Weeks of Treatment: [1:Open] [2:Open] [N/A:N/A] Wound Status: [1:1x0.7x0.1] [2:20x33x0.1] [N/A:N/A] Measurements L x W x D (cm) [1:0.55] [2:518.363] [N/A:N/A] A (cm) : rea [1:0.055] [2:51.836] [N/A:N/A] Volume (cm) : [1:Grade 2] [2:Full Thickness Without Exposed] [N/A:N/A] Classification: [1:Medium] [2:Support Structures Medium] [N/A:N/A] Exudate Amount: [1:Serosanguineous] [2:Serous] [N/A:N/A] Exudate Type: [1:red, brown] [2:amber] [N/A:N/A] Exudate Color: [1:Large (67-100%)] [2:Large (67-100%)] [N/A:N/A] Granulation Amount: [1:Pink, Pale] [2:Red, Pink] [N/A:N/A] Granulation Quality: [1:None Present (0%)] [2:None Present (0%)] [N/A:N/A] Necrotic Amount: [  1:Fat Layer (Subcutaneous Tissue): Yes Fat Layer (Subcutaneous Tissue): Yes N/A] Exposed Structures: [1:Fascia: No Tendon: No Muscle: No Joint: No Bone: No None] [2:Fascia: No Tendon: No Muscle: No Joint: No Bone: No None] [N/A:N/A] Epithelialization: [1:Compression Therapy] [2:N/A] [N/A:N/A] Treatment Notes Electronic Signature(s) Signed: 10/09/2020 5:37:49 PM By: Linton Ham MD Signed: 10/09/2020 6:08:00 PM By: Deon Pilling Entered By: Linton Ham on 10/09/2020 10:24:08 -------------------------------------------------------------------------------- Multi-Disciplinary Care Plan Details Patient Name: Date of Service: [facility] [place], [name] Teresa B. 10/09/2020 9:00 A M Medical Record Number: [id number] Patient  Account Number: [id number] Date of Birth/Sex: Treating RN: January 07, 1961 (59 y.o. Helene Shoe, Meta.Reding Primary Care Davonne Jarnigan: Freda Munro Other Clinician: Referring Elmar Antigua: Treating Stephannie Broner/Extender: [name], [name] in Treatment: 0 Active Inactive Nutrition Nursing Diagnoses: Impaired glucose control: actual or potential Goals: Patient/caregiver agrees to and verbalizes understanding of need to obtain nutritional consultation Date Initiated: 10/09/2020 Target Resolution Date: 11/28/2020 Goal Status: Active Patient/caregiver will maintain therapeutic glucose control Date Initiated: 10/09/2020 Target Resolution Date: 11/28/2020 Goal Status: Active Interventions: Assess HgA1c results as ordered upon admission and as needed Provide education on elevated blood sugars and impact on wound healing Provide education on nutrition Treatment Activities: Obtain HgA1c : 10/09/2020 Patient referred to Primary Care Physician for further nutritional evaluation : 10/09/2020 Notes: Orientation to the Wound Care Program Nursing Diagnoses: Knowledge deficit related to the wound healing center program Goals: Patient/caregiver will verbalize understanding of the [place] Date Initiated: 10/09/2020 Target Resolution Date: 10/16/2020 Goal Status: Active Interventions: Provide education on orientation to the wound center Notes: Pain, Acute or Chronic Nursing Diagnoses: Pain, acute or chronic: actual or potential Potential alteration in comfort, pain Goals: Patient will verbalize adequate pain control and receive pain control interventions during procedures as needed Date Initiated: 10/09/2020 Target Resolution Date: 11/14/2020 Goal Status: Active Patient/caregiver will verbalize comfort level met Date Initiated: 10/09/2020 Target Resolution Date: 12/04/2020 Goal Status: Active Interventions: Encourage patient to take pain medications as  prescribed Provide education on pain management Reposition patient for comfort Treatment Activities: Administer pain control measures as ordered : 10/09/2020 Notes: Electronic Signature(s) Signed: 10/09/2020 6:08:00 PM By: Deon Pilling Entered By: Deon Pilling on 10/09/2020 09:19:19 -------------------------------------------------------------------------------- Pain Assessment Details Patient Name: Date of Service: [facility], [name] Teresa B. 10/09/2020 9:00 A M Medical Record Number: [id number] Patient Account Number: [id number] Date of Birth/Sex: Treating RN: 1961-11-23 (59 y.o. Orvan Falconer Primary Care Gabrelle Roca: Freda Munro Other Clinician: Referring Genessa Beman: Treating Lovelee Forner/Extender: [name], [name] in Treatment: 0 Active Problems Location of Pain Severity and Description of Pain Patient Has Paino Yes Site Locations Pain Location: Generalized Pain Duration of the Pain. Constant / Intermittento Intermittent Rate the pain. Current Pain Level: 8 Worst Pain Level: 10 Least Pain Level: 0 Tolerable Pain Level: 5 Character of Pain Describe the Pain: Sharp, Shooting Pain Management and Medication Current Pain Management: Medication: Yes Cold Application: No Rest: Yes Massage: No Activity: No T.E.N.S.: No Heat Application: No Leg drop or elevation: No Is the Current Pain Management Adequate: Inadequate How does your wound impact your activities of daily livingo Sleep: Yes Bathing: No Appetite: No Relationship With Others: No Bladder Continence: No Emotions: No Bowel Continence: No Work: No Toileting: No Drive: No Dressing: No Hobbies: No Electronic Signature(s) Signed: 10/10/2020 5:00:24 PM By: Carlene Coria RN Entered By: Carlene Coria on 10/09/2020 09:42:28 -------------------------------------------------------------------------------- Patient/Caregiver Education Details Patient Name: Date of Service: [facility], [place] Teresa B.  11/4/2021andnbsp9:00 A M Medical Record Number: [id number] Patient Account Number: [id number] Date of Birth/Gender: Treating RN: Jun 29, 1961 (59 y.o. [name] Primary Care Physician: Freda Munro Other Clinician: Referring Physician: Treating Physician/Extender: [name], [name] in Treatment: 0 Education Assessment Education Provided To: Patient Education Topics Provided Elevated Blood Sugar/ Impact on Healing: Handouts: Elevated Blood Sugars: How Do They Affect Wound Healing Methods: Explain/Verbal, Printed Responses: Reinforcements needed [place]: o Handouts: Welcome T [facility] o Methods: Explain/Verbal, Printed Responses: Reinforcements needed Electronic Signature(s) Signed: 10/09/2020 6:08:00 PM By: Deon Pilling Entered By: Deon Pilling on 10/09/2020 09:19:37 -------------------------------------------------------------------------------- Wound Assessment Details Patient Name: Date of Service: [facility], [name] Teresa B. 10/09/2020 9:00 A M Medical Record Number: [id number] Patient Account Number: [id number] Date of Birth/Sex: Treating RN: 09-27-1961 (59 y.o. Orvan Falconer Primary Care Yaremi Stahlman: Freda Munro Other Clinician: Referring Rozetta Stumpp: Treating Salma Walrond/Extender: [name], [name] in Treatment: 0 Wound Status Wound Number: 1 Primary Diabetic Wound/Ulcer of the Lower Extremity Etiology: Wound Location: Left, Posterior Lower Leg Wound Open Wounding Event: Gradually Appeared Status: Date Acquired: 09/05/2020 Comorbid Asthma, Sleep Apnea, Congestive Heart Failure, Coronary Artery Weeks Of Treatment: 0 History: Disease, Hypertension, Type I Diabetes, Neuropathy Clustered Wound: No Wound Measurements Length: (cm) 1 Width: (cm) 0.7 Depth: (cm) 0.1 Area: (cm) 0.55 Volume: (cm) 0.055 % Reduction in Area: % Reduction in  Volume: Epithelialization: None Tunneling: No Undermining: No Wound Description Classification: Grade 2 Exudate Amount: Medium Exudate Type: Serosanguineous Exudate Color: red, brown Foul Odor After Cleansing: No Slough/Fibrino No Wound Bed Granulation Amount: Large (67-100%) Exposed Structure Granulation Quality: Pink, Pale Fascia Exposed: No Necrotic Amount: None Present (0%) Fat Layer (Subcutaneous Tissue) Exposed: Yes Tendon Exposed: No Muscle Exposed: No Joint Exposed: No Bone Exposed: No Treatment Notes Wound #1 (Left, Posterior Lower Leg) 2. Periwound Care Moisturizing lotion 3. Primary Dressing Applied Calcium Alginate Ag 4. Secondary Dressing Dry Gauze 6. Support Layer Applied 3 layer compression wrap Electronic Signature(s) Signed: 10/10/2020 5:00:24 PM By: Carlene Coria RN Entered By: Carlene Coria on 10/09/2020 09:37:37 -------------------------------------------------------------------------------- Wound Assessment Details Patient Name: Date of Service: [facility] [place], [name] Teresa B. 10/09/2020 9:00 A M Medical Record Number: [id number] Patient Account Number: [id number] Date of Birth/Sex: Treating RN: 11-Jul-1961 (59 y.o. Orvan Falconer Primary Care Cataleia Gade: Freda Munro Other Clinician: Referring Arrington Yohe: Treating Prayan Ulin/Extender: [name], [name] in Treatment: 0 Wound Status Wound Number: 2 Primary Fungal Etiology: Wound Location: Groin Wound Open Wounding Event: Gradually Appeared Status: Date Acquired: 09/05/2020 Comorbid Asthma, Sleep Apnea, Congestive Heart Failure, Coronary Artery Weeks Of Treatment: 0 History: Disease, Hypertension, Type I Diabetes, Neuropathy Clustered Wound: No Wound Measurements Length: (cm) 20 Width: (cm) 33 Depth: (cm) 0.1 Area: (cm) 518.363 Volume: (cm) 51.836 % Reduction in Area: % Reduction in Volume: Epithelialization: None Tunneling: No Undermining: No Wound  Description Classification: Full Thickness Without Exposed Support Structures Exudate Amount: Medium Exudate Type: Serous Exudate Color: amber Foul Odor After Cleansing: No Slough/Fibrino No Wound Bed Granulation Amount: Large (67-100%) Exposed Structure Granulation Quality: Red, Pink Fascia Exposed: No Necrotic Amount: None Present (0%) Fat Layer (Subcutaneous Tissue) Exposed: Yes Tendon Exposed: No Muscle Exposed: No Joint Exposed: No Bone Exposed: No Treatment Notes Wound #2 (Groin) 2. Periwound Care Antifungal cream 3. Primary Dressing Applied Other primary dressing (specifiy in notes) Notes dry gauze in skin folds Electronic Signature(s) Signed: 10/10/2020 5:00:24 PM By: Carlene Coria RN  Entered By: Carlene Coria on 10/09/2020 09:40:12 -------------------------------------------------------------------------------- Vitals Details Patient Name: Date of Service: [facility] [place], [name] Teresa B. 10/09/2020 9:00 A M Medical Record Number: [id number] Patient Account Number: [id number] Date of Birth/Sex: Treating RN: 1961/05/25 (59 y.o. Orvan Falconer Primary Care Areli Frary: Freda Munro Other Clinician: Referring Zarah Carbon: Treating Yardley Lekas/Extender: [name], [name] in Treatment: 0 Vital Signs Time Taken: 08:54 Temperature (F): 98.4 Height (in): 61 Pulse (bpm): 63 Source: Stated Respiratory Rate (breaths/min): 18 Weight (lbs): 300 Blood Pressure (mmHg): 160/86 Source: Stated Reference Range: 80 - 120 mg / dl Body Mass Index (BMI): 56.7 Electronic Signature(s) Signed: 10/10/2020 5:00:24 PM By: Carlene Coria RN Entered By: Carlene Coria on 10/09/2020 08:54:57

## 2020-10-10 NOTE — Progress Notes (Signed)
[name], [name] ([id number]) Visit Report for 10/09/2020 Chief Complaint Document Details Patient Name: Date of Service: [facility] [name], [name] [name] 10/09/2020 9:00 A M Medical Record Number: [id number] Patient Account Number: [id number] Date of Birth/Sex: Treating RN: [birth year] (59 y.o. [name] Primary Care Provider: Freda [name] Other Clinician: Referring Provider: Treating Provider/Extender: [name], KA [name] in Treatment: 0 Information Obtained from: Patient Chief Complaint 10/09/2020; patient is here for multiple skin issues including intertrigo in her lower abdominal pannus, wound on her left posterior calf and a rash on her right buttock and lower back Electronic Signature(s) Signed: 10/09/2020 5:37:49 PM By: Teresa Ham MD Entered By: [name] on 10/09/2020 10:24:51 -------------------------------------------------------------------------------- HPI Details Patient Name: Date of Service: Teresa Perla, [place] [name] 10/09/2020 9:00 A M Medical Record Number: [id number] Patient Account Number: [id number] Date of Birth/Sex: Treating RN: Aug 21, 1961 (59 y.o. [name] Primary Care Provider: Freda [name] Other Clinician: Referring Provider: Treating Provider/Extender: [name], KA [name] in Treatment: 0 History of Present Illness HPI Description: ADMISSION 10/09/2020 This is a 59 year old woman who is a type I diabetic with a last hemoglobin A1c of 10. She is referred from her primary doctor after developing a refractory rash in the folds of her lower abdominal pannus. There is no open wound here however extensive intertrigo with classic satellite lesions highly suggestive of candidal infection. This is reasonably extensive on the lower abdominal pannus the superior part of her pubic symphysis all the way into the lower abdomen. She has been using nystatin powder and she did receive a course of  Diflucan apparently for 10 days from her primary physician although I did not see this anywhere. I think things are somewhat better. Additionally the patient also brought up the fact that she has a small dime sized area on her left posterior calf and asked [place] to look at this. This is been there for about a month. She does not wear compression stockings. Last sleep she pointed out a rash on her left buttock and left lower back she has been scratching at this. There is no open wound here per se. 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 10/09/2020 9:00 A M Medical Record Number: [id number] Patient Account Number: [id number] Date of Birth/Sex: Treating RN: Jan 04, 1961 (59 y.o. [name] Primary Care Provider: Freda [name] Other Clinician: Referring Provider: Treating Provider/Extender: [name], KA [name] in Treatment: 0 Constitutional Patient is hypertensive.. Pulse regular and within target range for patient.Marland Kitchen Respirations regular, non-labored and within target range.. Temperature is normal and within the target range for the patient.Marland Kitchen Appears in no distress. Cardiovascular Her pedal pulses were palpable at the dorsalis pedis and posterior tibial. Edema present in both extremities. Skin changes of lymphedema especially on the left thick skin raised hyperkeratotic areas chronic erythema suggestive of stasis  dermatitis.. Gastrointestinal (GI) Massively obese nontender. Integumentary (Hair, Skin) Patient has several skin issues including intertrigo in the lower abdominal pannus which is extensive satellite lesions suggestive of Candida. She has lymphedema and chronic stasis  dermatitis in the left leg. She also has a rash on the left buttock and lower back. This has scaling and some raised lesions I am not precisely sure what this is. Psychiatric appears at normal baseline. Notes Wound exam She does not have a wound in her pannus however this is an extensive candidal rash large area on the abdominal fold, satellite lesions suggestive of Candida Skin changes of lymphedema in her left lower leg. Dime sized wound. She has hyperkeratotic anterior skin changes compatible with chronic lymphedematous changes. Also stasis dermatitis She has a rash on her left buttock left lower back. I am not precisely sure what this is she has been scratching at it there is large scratch marks on this. Electronic Signature(s) Signed: 10/09/2020 5:37:49 PM By: Teresa Ham MD Entered By: [name] on 10/09/2020 10:43:44 -------------------------------------------------------------------------------- Physician Orders Details Patient Name: Date of Service: [name], [name] [name] 10/09/2020 9:00 A M Medical Record Number: [id number] Patient Account Number: [id number] Date of Birth/Sex: Treating RN: 02-06-1961 (59 y.o. [name] Primary Care Provider: Freda [name] Other Clinician: Referring Provider: Treating Provider/Extender: [name], KA [name] in Treatment: 0 Verbal / Phone Orders: No Diagnosis Coding Follow-up Appointments Return Appointment in 1 week. Dressing Change Frequency Wound #1 Left,Posterior Lower Leg Do not change entire dressing for one week. Wound #2 Groin Change dressing every day. Skin Barriers/Peri-Wound Care Wound #2 Groin ntifungal cream -  apply ketoconazole cream in clinic. A Nystatin cream daily to skin folds under abdomen and groin. Patient to pick up from Pharmacy. Wound Cleansing Wound #1 Left,Posterior Lower Leg May shower with protection. - use a cast protector. Wound #2 Groin Other: - cleanse groin and abdomen daily with soap and water. lay down and allow skin folds to dry before dressing then apply towels or sheets to skinfolds. Primary Wound Dressing Wound #1 Left,Posterior Lower Leg Calcium Alginate with Silver Secondary Dressing Wound #1 Left,Posterior Lower Leg Dry Gauze ABD pad Edema Control 3 Layer Compression System - Left Lower Extremity Avoid standing for long periods of time Elevate legs to the level of the heart or above for 30 minutes daily and/or when sitting, a frequency of: - throughout the day. Exercise regularly Additional Orders / Instructions Wound #2 Groin Other: - Keep skin folds clean and dry. Apply clean dry towels or sheets between skin folds to aid in allowing separation to decrease moisture. Patient may also purchase interdry and use in place of towels. [email or web address] Patient to pick Dlflucan oral medication from pharmacy. Patient Medications llergies: carvedilol, Benicar, Celexa, Sulfa (Sulfonamide Antibiotics), Trulicity, codeine A Notifications Medication Indication Start End intertrigo 10/09/2020 Diflucan DOSE oral 100 mg tablet - 1 tablet oral for 2 weeks 10/09/2020 nystatin DOSE topical 100,000 unit/gram ointment - ointment topical to affected area bid Electronic Signature(s) Signed: 10/09/2020 10:49:59 AM By: Teresa Ham MD Entered By: [name] on 10/09/2020 10:49:58 -------------------------------------------------------------------------------- Problem List Details Patient Name: Date of Service: [facility], [place] [name] 10/09/2020 9:00 A M Medical Record Number: [id number] Patient Account Number: [id number] Date of Birth/Sex: Treating RN: [birth year] (59 y.o.  [name] Primary Care Provider: Freda [name] Other Clinician: Referring Provider: Treating Provider/Extender: [name], KA [name] in Treatment: 0 Active Problems ICD-10 Encounter Code Description Active Date MDM Diagnosis I89.0 Lymphedema, not elsewhere classified 10/09/2020 No Yes L97.221 Non-pressure chronic ulcer of left calf limited to breakdown of skin 10/09/2020 No Yes L30.4 Erythema intertrigo 10/09/2020 No Yes B37.89  Other sites of candidiasis 10/09/2020 No Yes Inactive Problems Resolved Problems Electronic Signature(s) Signed: 10/09/2020 5:37:49 PM By: Teresa Ham MD Entered By: [name] on 10/09/2020 10:23:51 -------------------------------------------------------------------------------- Progress Note Details Patient Name: Date of Service: [facility] [place], [name] [name] 10/09/2020 9:00 A M Medical Record Number: [id number] Patient Account Number: [id number] Date of Birth/Sex: Treating RN: 18-Nov-1961 (59 y.o. [name] Primary Care Provider: Freda [name] Other Clinician: Referring Provider: Treating Provider/Extender: [name], KA [name] in Treatment: 0 Subjective Chief Complaint Information obtained from Patient 10/09/2020; patient is here for multiple skin issues including intertrigo in her lower abdominal pannus, wound on her left posterior calf and a rash on her right buttock and lower back History of Present Illness (HPI) ADMISSION 10/09/2020 This is a 59 year old woman who is a type I diabetic with a last hemoglobin A1c of 10. She is referred from her primary doctor after developing a refractory rash in the folds of her lower abdominal pannus. There is no open wound here however extensive intertrigo with classic satellite lesions highly suggestive of candidal infection. This is reasonably extensive on the lower abdominal pannus the superior part of her pubic symphysis all the way into the  lower abdomen. She has been using nystatin powder and she did receive a course of Diflucan apparently for 10 days from her primary physician although I did not see this anywhere. I think things are somewhat better. Additionally the patient also brought up the fact that she has a small dime sized area on her left posterior calf and asked [place] to look at this. This is been there for about a month. She does not wear compression stockings. Last sleep she pointed out a rash on her left buttock and left lower back she has been scratching at this. There is no open wound here per se. 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 Allergies carvedilol, Benicar, Celexa, Sulfa (Sulfonamide Antibiotics), Trulicity, codeine Family History Cancer - Maternal Grandparents, Diabetes - Father,Siblings,Maternal Grandparents,Paternal Grandparents, Heart Disease - Father,Siblings,Child,Maternal Grandparents, Hypertension - Maternal Grandparents,Paternal Grandparents,Mother,Father,Siblings,Child, Kidney Disease - Father,Mother, Stroke - Mother,Siblings, Thyroid Problems - Father,Siblings, No family history of Hereditary Spherocytosis, Lung Disease, Seizures, Tuberculosis. Social History Never smoker, Marital Status - Married, Alcohol Use - Never, Drug Use - No History, Caffeine Use - Moderate. Medical History Eyes Denies history of Cataracts, Glaucoma, Optic Neuritis Ear/Nose/Mouth/Throat Denies history of Chronic sinus problems/congestion, Middle ear problems Hematologic/Lymphatic Denies history of Anemia, Hemophilia, Human Immunodeficiency Virus, Lymphedema, Sickle Cell Disease Respiratory Patient has history  of Asthma, Sleep Apnea Denies history of Aspiration, Chronic Obstructive Pulmonary Disease (COPD), Pneumothorax, Tuberculosis Cardiovascular Patient has history of Congestive Heart Failure, Coronary Artery Disease, Hypertension Denies history of Angina, Arrhythmia, Deep Vein Thrombosis, Hypotension, Myocardial Infarction, Peripheral Arterial Disease, Peripheral Venous Disease, Phlebitis Gastrointestinal Denies history of Cirrhosis , Colitis, Crohnoos, Hepatitis A, Hepatitis B, Hepatitis C Endocrine Patient has history of Type I Diabetes Denies history of Type II Diabetes Genitourinary Denies history of End Stage Renal Disease Immunological Denies history of Lupus Erythematosus, Raynaudoos, Scleroderma Integumentary (Skin) Denies history of History of Burn Musculoskeletal Denies history of Gout, Rheumatoid Arthritis, Osteoarthritis, Osteomyelitis Neurologic Patient has history of Neuropathy Denies history of Dementia, Quadriplegia, Paraplegia, Seizure Disorder Oncologic Denies history  of Received Chemotherapy, Received Radiation Psychiatric Denies history of Anorexia/bulimia, Confinement Anxiety Patient is treated with Insulin. Blood sugar is tested. Review of Systems (ROS) Constitutional Symptoms (General Health) Denies complaints or symptoms of Fatigue, Fever, Chills, Marked Weight Change. Eyes Complains or has symptoms of Glasses / Contacts. Denies complaints or symptoms of Dry Eyes, Vision Changes. Ear/Nose/Mouth/Throat Denies complaints or symptoms of Chronic sinus problems or rhinitis. Respiratory Denies complaints or symptoms of Chronic or frequent coughs, Shortness of Breath. Cardiovascular Denies complaints or symptoms of Chest pain. Gastrointestinal Denies complaints or symptoms of Frequent diarrhea, Nausea, Vomiting. Endocrine Denies complaints or symptoms of Heat/cold intolerance. Genitourinary Denies complaints or symptoms of Frequent  urination. Integumentary (Skin) Complains or has symptoms of Wounds. Musculoskeletal Denies complaints or symptoms of Muscle Pain, Muscle Weakness. Neurologic Denies complaints or symptoms of Numbness/parasthesias. Psychiatric Denies complaints or symptoms of Claustrophobia, Suicidal. Objective Constitutional Patient is hypertensive.. Pulse regular and within target range for patient.Marland Kitchen Respirations regular, non-labored and within target range.. Temperature is normal and within the target range for the patient.Marland Kitchen Appears in no distress. Vitals Time Taken: 8:54 AM, Height: 61 in, Source: Stated, Weight: 300 lbs, Source: Stated, BMI: 56.7, Temperature: 98.4 F, Pulse: 63 bpm, Respiratory Rate: 18 breaths/min, Blood Pressure: 160/86 mmHg. Cardiovascular Her pedal pulses were palpable at the dorsalis pedis and posterior tibial. Edema present in both extremities. Skin changes of lymphedema especially on the left thick skin raised hyperkeratotic areas chronic erythema suggestive of stasis dermatitis.. Gastrointestinal (GI) Massively obese nontender. Psychiatric appears at normal baseline. General Notes: Wound exam ooShe does not have a wound in her pannus however this is an extensive candidal rash large area on the abdominal fold, satellite lesions suggestive of Candida ooSkin changes of lymphedema in her left lower leg. Dime sized wound. She has hyperkeratotic anterior skin changes compatible with chronic lymphedematous changes. Also stasis dermatitis ooShe has a rash on her left buttock left lower back. I am not precisely sure what this is she has been scratching at it there is large scratch marks on this. Integumentary (Hair, Skin) Patient has several skin issues including intertrigo in the lower abdominal pannus which is extensive satellite lesions suggestive of Candida. She has lymphedema and chronic stasis dermatitis in the left leg. She also has a rash on the left buttock and lower  back. This has scaling and some raised lesions I am not precisely sure what this is. Wound #1 status is Open. Original cause of wound was Gradually Appeared. The wound is located on the Left,Posterior Lower Leg. The wound measures 1cm length x 0.7cm width x 0.1cm depth; 0.55cm^2 area and 0.055cm^3 volume. There is Fat Layer (Subcutaneous Tissue) exposed. There is no tunneling or undermining noted. There is a medium amount of serosanguineous drainage noted. There is large (67-100%) pink, pale granulation within the wound bed. There is no necrotic tissue within the wound bed. Wound #2 status is Open. Original cause of wound was Gradually Appeared. The wound is located on the Groin. The wound measures 20cm length x 33cm width x 0.1cm depth; 518.363cm^2 area and 51.836cm^3 volume. There is Fat Layer (Subcutaneous Tissue) exposed. There is no tunneling or undermining noted. There is a medium amount of serous drainage noted. There is large (67-100%) red, pink granulation within the wound bed. There is no necrotic tissue within the wound bed. Assessment Active Problems ICD-10 Lymphedema, not elsewhere classified Non-pressure chronic ulcer of left calf limited to breakdown of skin Erythema intertrigo Other sites of candidiasis Procedures Wound #1  Pre-procedure diagnosis of Wound #1 is a Diabetic Wound/Ulcer of the Lower Extremity located on the Left,Posterior Lower Leg . There was a Three Layer Compression Therapy Procedure by Levan Hurst, RN. Post procedure Diagnosis Wound #1: Same as Pre-Procedure Plan Follow-up Appointments: Return Appointment in 1 week. Dressing Change Frequency: Wound #1 Left,Posterior Lower Leg: Do not change entire dressing for one week. Wound #2 Groin: Change dressing every day. Skin Barriers/Peri-Wound Care: Wound #2 Groin: Antifungal cream - apply ketoconazole cream in clinic. Nystatin cream daily to skin folds under abdomen and groin. Patient to pick up from  Pharmacy. Wound Cleansing: Wound #1 Left,Posterior Lower Leg: May shower with protection. - use a cast protector. Wound #2 Groin: Other: - cleanse groin and abdomen daily with soap and water. lay down and allow skin folds to dry before dressing then apply towels or sheets to skinfolds. Primary Wound Dressing: Wound #1 Left,Posterior Lower Leg: Calcium Alginate with Silver Secondary Dressing: Wound #1 Left,Posterior Lower Leg: Dry Gauze ABD pad Edema Control: 3 Layer Compression System - Left Lower Extremity Avoid standing for long periods of time Elevate legs to the level of the heart or above for 30 minutes daily and/or when sitting, a frequency of: - throughout the day. Exercise regularly Additional Orders / Instructions: Wound #2 Groin: Other: - Keep skin folds clean and dry. Apply clean dry towels or sheets between skin folds to aid in allowing separation to decrease moisture. Patient may also purchase interdry and use in place of towels. [email or web address] Patient to pick Dlflucan oral medication from pharmacy. The following medication(s) was prescribed: Diflucan oral 100 mg tablet 1 tablet oral for 2 weeks for intertrigo starting 10/09/2020 nystatin topical 100,000 unit/gram ointment ointment topical to affected area bid starting 10/09/2020 1. Extensive abdominal intertrigo likely Candida. I will use nystatin cream instead of the powder twice daily. The problem with the powder is it does not stay in contact. Diflucan 100 mg a day for 2 weeks. I counseled her to keep this area clean and dry she may have to do this twice daily. Rolled towel to separate the pannus folds. Interdry would also be acceptable 2. She has lymphedema with skin changes on the left greater than right lower leg. Silver alginate TCA and 3 layer compression to the left leg. This should be easy to heal however she is going to need compression stockings going forward and we talked about this 3. She has a rash on her left  buttock and lower back. She has been complying at this. I am not really sure what this is and I will look at it again next week. Electronic Signature(s) Signed: 10/09/2020 5:37:49 PM By: Teresa Ham MD Entered By: [name] on 10/09/2020 10:51:37 -------------------------------------------------------------------------------- HxROS Details Patient Name: Date of Service: [facility], [name] [name] 10/09/2020 9:00 A M Medical Record Number: [id number] Patient Account Number: [id number] Date of Birth/Sex: Treating RN: 1961/10/31 (59 y.o. Orvan Falconer Primary Care Provider: Freda [name] Other Clinician: Referring Provider: Treating Provider/Extender: [name], KA [name] in Treatment: 0 Information Obtained From Patient Constitutional Symptoms (General Health) Complaints and Symptoms: Negative for: Fatigue; Fever; Chills; Marked Weight Change Eyes Complaints and Symptoms: Positive for: Glasses / Contacts Negative for: Dry Eyes; Vision Changes Medical History: Negative for: Cataracts; Glaucoma; Optic Neuritis Ear/Nose/Mouth/Throat Complaints and Symptoms: Negative for: Chronic sinus problems or rhinitis Medical History: Negative for: Chronic sinus problems/congestion; Middle ear problems Respiratory Complaints and Symptoms: Negative for: Chronic or frequent coughs; Shortness  of Breath Medical History: Positive for: Asthma; Sleep Apnea Negative for: Aspiration; Chronic Obstructive Pulmonary Disease (COPD); Pneumothorax; Tuberculosis Cardiovascular Complaints and Symptoms: Negative for: Chest pain Medical History: Positive for: Congestive Heart Failure; Coronary Artery Disease; Hypertension Negative for: Angina; Arrhythmia; Deep Vein Thrombosis; Hypotension; Myocardial Infarction; Peripheral Arterial Disease; Peripheral Venous Disease; Phlebitis Gastrointestinal Complaints and Symptoms: Negative for: Frequent diarrhea; Nausea;  Vomiting Medical History: Negative for: Cirrhosis ; Colitis; Crohns; Hepatitis A; Hepatitis B; Hepatitis C Endocrine Complaints and Symptoms: Negative for: Heat/cold intolerance Medical History: Positive for: Type I Diabetes Negative for: Type II Diabetes Time with diabetes: 62 Treated with: Insulin Blood sugar tested every day: Yes Tested : Genitourinary Complaints and Symptoms: Negative for: Frequent urination Medical History: Negative for: End Stage Renal Disease Integumentary (Skin) Complaints and Symptoms: Positive for: Wounds Medical History: Negative for: History of Burn Musculoskeletal Complaints and Symptoms: Negative for: Muscle Pain; Muscle Weakness Medical History: Negative for: Gout; Rheumatoid Arthritis; Osteoarthritis; Osteomyelitis Neurologic Complaints and Symptoms: Negative for: Numbness/parasthesias Medical History: Positive for: Neuropathy Negative for: Dementia; Quadriplegia; Paraplegia; Seizure Disorder Psychiatric Complaints and Symptoms: Negative for: Claustrophobia; Suicidal Medical History: Negative for: Anorexia/bulimia; Confinement Anxiety Hematologic/Lymphatic Medical History: Negative for: Anemia; Hemophilia; Human Immunodeficiency Virus; Lymphedema; Sickle Cell Disease Immunological Medical History: Negative for: Lupus Erythematosus; Raynauds; Scleroderma Oncologic Medical History: Negative for: Received Chemotherapy; Received Radiation Immunizations Pneumococcal Vaccine: Received Pneumococcal Vaccination: No Implantable Devices None Family and Social History Cancer: Yes - Maternal Grandparents; Diabetes: Yes - Father,Siblings,Maternal Grandparents,Paternal Grandparents; Heart Disease: Yes - Father,Siblings,Child,Maternal Grandparents; Hereditary Spherocytosis: No; Hypertension: Yes - Maternal Grandparents,Paternal Grandparents,Mother,Father,Siblings,Child; Kidney Disease: Yes - Father,Mother; Lung Disease: No; Seizures: No;  Stroke: Yes - Mother,Siblings; Thyroid Problems: Yes - Father,Siblings; Tuberculosis: No; Never smoker; Marital Status - Married; Alcohol Use: Never; Drug Use: No History; Caffeine Use: Moderate; Financial Concerns: No; Food, Clothing or Shelter Needs: No; Support System Lacking: No; Transportation Concerns: No Electronic Signature(s) Signed: 10/09/2020 5:37:49 PM By: Teresa Ham MD Signed: 10/10/2020 5:00:24 PM By: Carlene Coria RN Entered By: Carlene Coria on 10/09/2020 09:05:25 -------------------------------------------------------------------------------- SuperBill Details Patient Name: Date of Service: [facility], [name] [name] 10/09/2020 Medical Record Number: [id number] Patient Account Number: [id number] Date of Birth/Sex: Treating RN: 1961/10/05 (59 y.o. [name] Primary Care Provider: Freda [name] Other Clinician: Referring Provider: Treating Provider/Extender: [name], KA [name] in Treatment: 0 Diagnosis Coding ICD-10 Codes Code Description I89.0 Lymphedema, not elsewhere classified L97.221 Non-pressure chronic ulcer of left calf limited to breakdown of skin L30.4 Erythema intertrigo B37.89 Other sites of candidiasis Facility Procedures CPT4 Code: 77412878 Description: [place] VISIT-LEV 3 EST PT Modifier: Quantity: 1 CPT4 Code: 67672094 Description: (Facility Use Only) [id number] - [place] BSJGGE LWR LT LEG Modifier: Quantity: 1 Physician Procedures : CPT4 Code Description Modifier [id number] [place] PHYS LEVEL 3 NEW PT ICD-10 Diagnosis Description I89.0 Lymphedema, not elsewhere classified L97.221 Non-pressure chronic ulcer of left calf limited to breakdown of skin L30.4 Erythema intertrigo B37.89 Other  sites of candidiasis Quantity: 1 Electronic Signature(s) Signed: 10/09/2020 5:37:49 PM By: Teresa Ham MD Entered By: [name] on 10/09/2020 10:52:01

## 2020-10-10 NOTE — Progress Notes (Signed)
Teresa, [name] ([id number]) Visit Report for 10/09/2020 Abuse/Suicide Risk Screen Details Patient Name: Date of Service: [facility] [place], [name] [name] 10/09/2020 9:00 A M Medical Record Number: [id number] Patient Account Number: [id number] Date of Birth/Sex: Treating RN: [birth year] (59 y.o. [name] Primary Care Jenah Vanasten: Freda Munro Other Clinician: Referring Marquita Lias: Treating Dia Donate/Extender: [name], [name] in Treatment: 0 Abuse/Suicide Risk Screen Items Answer ABUSE RISK SCREEN: Has anyone close to you tried to hurt or harm you recentlyo No Do you feel uncomfortable with anyone in your familyo No Has anyone forced you do things that you didnt want to doo No Electronic Signature(s) Signed: 10/10/2020 5:00:24 PM By: Carlene Coria RN Entered By: Carlene Coria on 10/09/2020 09:05:36 -------------------------------------------------------------------------------- Activities of Daily Living Details Patient Name: Date of Service: [name], [name] [name] 10/09/2020 9:00 A M Medical Record Number: [id number] Patient Account Number: [id number] Date of Birth/Sex: Treating RN: July 08, 1961 (59 y.o. [name] Primary Care Keevon Henney: Freda Munro Other Clinician: Referring Kayde Atkerson: Treating Dakisha Schoof/Extender: [name], [name] in Treatment: 0 Activities of Daily Living Items Answer Activities of Daily Living (Please select one for each item) Drive Automobile Not Able T Medications ake Completely Able Use T elephone Completely Able Care for Appearance Completely Able Use T oilet Completely Able Bath / Shower Need Assistance Dress Self Need Assistance Feed Self Completely Able Walk Completely Able Get In / Out Bed Need Assistance Housework Need Lequire for Self Need Assistance Electronic Signature(s) Signed: 10/10/2020 5:00:24 PM By: Carlene Coria RN Entered By: Carlene Coria on 10/09/2020 09:07:25 -------------------------------------------------------------------------------- Education Screening Details Patient Name: Date of Service: Teresa Perla, [place] [name] 10/09/2020 9:00 A M Medical Record Number: [id number] Patient Account Number: [id number] Date of Birth/Sex: Treating RN: April 01, 1961 (59 y.o. [name] Primary Care Shuntae Herzig: Freda Munro Other Clinician: Referring Vasilis Luhman: Treating Maryalyce Sanjuan/Extender: [name], [name] in Treatment: 0 Primary Learner Assessed: Patient Learning Preferences/Education Level/Primary Language Learning Preference: Explanation Highest Education Level: College or Above Preferred Language: English Cognitive Barrier Language Barrier: No Translator Needed: No Memory Deficit: No Emotional Barrier: No Cultural/Religious Beliefs Affecting Medical Care: No Physical Barrier Impaired Vision: Yes Glasses Impaired Hearing: No Decreased Hand dexterity: No Knowledge/Comprehension Knowledge Level: Medium Comprehension Level: High Ability to understand written instructions: High Ability to understand verbal instructions: High Motivation Anxiety Level: Anxious Cooperation: Cooperative Education Importance: Acknowledges Need Interest in Health Problems: Asks Questions Perception: Coherent Willingness to Engage in Self-Management High Activities: Readiness to Engage in Self-Management High Activities: Electronic Signature(s) Signed: 10/10/2020 5:00:24 PM By: Carlene Coria RN Entered By: Carlene Coria on 10/09/2020 09:08:00 -------------------------------------------------------------------------------- Fall Risk Assessment Details Patient Name: Date of Service: Teresa Perla, [place] [name] 10/09/2020 9:00 A M Medical Record Number: [id number] Patient Account Number: [id number] Date of Birth/Sex: Treating RN: Apr 25, 1961 (59 y.o. [name] Primary Care  Elisabeth Strom: Freda Munro Other Clinician: Referring Ameyah Bangura: Treating Onesti Bonfiglio/Extender: [name], [name] in Treatment: 0 Fall Risk Assessment Items Have you had 2 or more falls in the last 12 monthso 0 No Have you had any fall that resulted in injury in the last 12 monthso 0 No FALLS RISK SCREEN History of falling - immediate or within 3 months 0 No Secondary diagnosis (Do you have 2 or more medical diagnoseso) 0 No Ambulatory aid None/bed rest/wheelchair/nurse 0 No Crutches/cane/walker 0 No  Furniture 0 No Intravenous therapy Access/Saline/Heparin Lock 0 No Gait/Transferring Normal/ bed rest/ wheelchair 0 No Weak (short steps with or without shuffle, stooped but able to lift head while walking, may seek 0 No support from furniture) Impaired (short steps with shuffle, may have difficulty arising from chair, head down, impaired 0 No balance) Mental Status Oriented to own ability 0 No Electronic Signature(s) Signed: 10/10/2020 5:00:24 PM By: Carlene Coria RN Entered By: Carlene Coria on 10/09/2020 09:08:23 -------------------------------------------------------------------------------- Foot Assessment Details Patient Name: Date of Service: [facility], [place] [name] 10/09/2020 9:00 A M Medical Record Number: [id number] Patient Account Number: [id number] Date of Birth/Sex: Treating RN: [birth year] (59 y.o. [name] Primary Care [name]: Freda Munro Other Clinician: Referring Damieon Armendariz: Treating Cleopatra Sardo/Extender: [name], [name] in Treatment: 0 Foot Assessment Items Site Locations + = Sensation present, - = Sensation absent, C = Callus, U = Ulcer R = Redness, W = Warmth, M = Maceration, PU = Pre-ulcerative lesion F = Fissure, S = Swelling, D = Dryness Assessment Right: Left: Other Deformity: No No Prior Foot Ulcer: No No Prior Amputation: No No Charcot Joint: No No Ambulatory Status: Ambulatory Without  Help Gait: Steady Electronic Signature(s) Signed: 10/10/2020 5:00:24 PM By: Carlene Coria RN Entered By: Carlene Coria on 10/09/2020 09:11:23 -------------------------------------------------------------------------------- Nutrition Risk Screening Details Patient Name: Date of Service: [name], [name] [name] 10/09/2020 9:00 A M Medical Record Number: [id number] Patient Account Number: [id number] Date of Birth/Sex: Treating RN: August 06, 1961 (59 y.o. [name] Primary Care Steele Ledonne: Freda Munro Other Clinician: Referring Labrea Eccleston: Treating Laiken Sandy/Extender: [name], [name] in Treatment: 0 Height (in): 61 Weight (lbs): 300 Body Mass Index (BMI): 56.7 Nutrition Risk Screening Items Score Screening NUTRITION RISK SCREEN: I have an illness or condition that made me change the kind and/or amount of food I eat 0 No I eat fewer than two meals per day 0 No I eat few fruits and vegetables, or milk products 0 No I have three or more drinks of beer, liquor or wine almost every day 0 No I have tooth or mouth problems that make it hard for me to eat 0 No I don't always have enough money to buy the food I need 0 No I eat alone most of the time 0 No I take three or more different prescribed or over-the-counter drugs a day 1 Yes Without wanting to, I have lost or gained 10 pounds in the last six months 0 No I am not always physically able to shop, cook and/or feed myself 2 Yes Nutrition Protocols Good Risk Protocol Moderate Risk Protocol 0 Provide education on nutrition High Risk Proctocol Risk Level: Moderate Risk Score: 3 Electronic Signature(s) Signed: 10/10/2020 5:00:24 PM By: Carlene Coria RN Entered By: Carlene Coria on 10/09/2020 09:08:56

## 2020-10-16 ENCOUNTER — Encounter (HOSPITAL_BASED_OUTPATIENT_CLINIC_OR_DEPARTMENT_OTHER): Payer: Medicare Other | Admitting: Internal Medicine

## 2020-10-16 ENCOUNTER — Other Ambulatory Visit: Payer: Self-pay

## 2020-10-16 DIAGNOSIS — L97829 Non-pressure chronic ulcer of other part of left lower leg with unspecified severity: Secondary | ICD-10-CM | POA: Diagnosis not present

## 2020-10-16 DIAGNOSIS — L98499 Non-pressure chronic ulcer of skin of other sites with unspecified severity: Secondary | ICD-10-CM | POA: Diagnosis not present

## 2020-10-16 DIAGNOSIS — I890 Lymphedema, not elsewhere classified: Secondary | ICD-10-CM | POA: Diagnosis not present

## 2020-10-16 DIAGNOSIS — E10622 Type 1 diabetes mellitus with other skin ulcer: Secondary | ICD-10-CM | POA: Diagnosis not present

## 2020-10-16 DIAGNOSIS — Z794 Long term (current) use of insulin: Secondary | ICD-10-CM | POA: Diagnosis not present

## 2020-10-16 DIAGNOSIS — L97221 Non-pressure chronic ulcer of left calf limited to breakdown of skin: Secondary | ICD-10-CM | POA: Diagnosis not present

## 2020-10-16 DIAGNOSIS — L304 Erythema intertrigo: Secondary | ICD-10-CM | POA: Diagnosis not present

## 2020-10-16 NOTE — Progress Notes (Signed)
SOSHA, SHEPHERD ([id number]) Visit Report for 10/16/2020 HPI Details Patient Name: Date of Service: [facility] [place], [name] Teresa B. 10/16/2020 12:30 PM Medical Record Number: [id number] Patient Account Number: [id number] Date of Birth/Sex: Treating RN: 1961-06-01 (59 y.o. [name] Primary Care Provider: Freda Munro Other Clinician: Referring Provider: Treating Provider/Extender: [name], [name] in Treatment: 1 History of Present Illness HPI Description: ADMISSION 10/09/2020 This is a 59 year old woman who is a type I diabetic with a last hemoglobin A1c of 10. She is referred from her primary doctor after developing a refractory rash in the folds of her lower abdominal pannus. There is no open wound here however extensive intertrigo with classic satellite lesions highly suggestive of candidal infection. This is reasonably extensive on the lower abdominal pannus the superior part of her pubic symphysis all the way into the lower abdomen. She has been using nystatin powder and she did receive a course of Diflucan apparently for 10 days from her primary physician although I did not see this anywhere. I think things are somewhat better. Additionally the patient also brought up the fact that she has a small dime sized area on her left posterior calf and asked [place] to look at this. This is been there for about a month. She does not wear compression stockings. Last sleep she pointed out a rash on her left buttock and left lower back she has been scratching at this. There is no open wound here per se. 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  She has chronic venous insufficiency and lymphedema here she will need stockings. The intertrigo in her lower abdominal pannus still is not much better. I gave her additional 2 weeks of Diflucan and topical nystatin. Perhaps minimally better. She is going to need to be more aggressive about separating the folds and keeping this clean and dry. She has a much better looking area under the left breast Electronic Signature(s) Signed: 10/16/2020 5:00:15 PM By: Linton Ham MD Entered By: Linton Ham on 10/16/2020 13:22:03 -------------------------------------------------------------------------------- Physical Exam Details Patient Name: Date of Service: [facility], [name] Teresa B. 10/16/2020 12:30 PM Medical Record Number: [id number] Patient Account Number: [id number] Date of Birth/Sex: Treating RN: 1961/06/02 (59 y.o. [name] Primary Care Provider: Freda Munro Other Clinician: Referring Provider: Treating Provider/Extender: [name], [name] in Treatment: 1 Constitutional Patient is hypertensive.. Pulse regular and within target range for patient.Marland Kitchen Respirations regular, non-labored and within target range.. Temperature is normal and within the target range for the patient.Marland Kitchen Appears in no distress. Notes Wound exam Her pannus still does not look that good. I was hoping for better. It looks like satellite lesion so I was quite confident that this was Candida and gave her nystatin ointment. I prefer this versus the powder since it stays in contact. Should not come in with any separation of the pannus folds. She has lymphedema in her left lower leg the wound however has healed. Electronic Signature(s) Signed: 10/16/2020 5:00:15 PM By: Linton Ham MD Entered By: Linton Ham on 10/16/2020 13:23:02 -------------------------------------------------------------------------------- Physician Orders Details Patient Name: Date of Service: [facility], [name] Teresa B. 10/16/2020 12:30 PM Medical Record Number:  [id number] Patient Account Number: [id number] Date of Birth/Sex: Treating RN: [birth year] (59 y.o. [name] Primary Care Provider: Freda Munro Other Clinician: Referring Provider: Treating Provider/Extender: [name], [name] in Treatment: 1 Verbal / Phone Orders: No Diagnosis Coding ICD-10 Coding Code Description I89.0 Lymphedema, not elsewhere classified L97.221 Non-pressure chronic ulcer of left calf limited to breakdown of skin L30.4 Erythema intertrigo B37.89 Other sites of candidiasis Discharge From [facility] Services Discharge from [place] - Patient to follow up with primary care provider. Skin Barriers/Peri-Wound Care Moisturizing lotion - apply lotion to both legs nightly. Other: - Nystatin cream daily to skin folds under abdomen and groin. Cleanse groin and abdomen daily with soap and water. lay down and allow skin folds to dry before dressing then apply towels or sheets to skinfolds. Keep skin folds clean and dry. Apply clean dry towels or sheets between skin folds to aid in allowing separation to decrease moisture. Patient may also purchase interdry and use in place of towels. [email or web address] Edema Control Avoid standing for long periods of time Elevate legs to the level of the heart or above for 30 minutes daily and/or when sitting, a frequency of: - throughout the day. Exercise regularly Support Garment 20-30 mm/Hg pressure to: - patient to purchase compression stockings from [employer]. Apply in the morning and remove at night. Electronic Signature(s) Signed: 10/16/2020 5:00:15 PM By: Linton Ham MD Signed: 10/16/2020 5:09:44 PM By: Deon Pilling Entered By: Deon Pilling on 10/16/2020 13:16:03 -------------------------------------------------------------------------------- Problem List Details Patient Name: Date of Service: [facility] [place], [name] Teresa B.  10/16/2020 12:30 PM Medical Record Number: [id number] Patient Account Number: [id number] Date of Birth/Sex: Treating RN: April 03, 1961 (59 y.o. [name] Primary Care Provider: Freda Munro Other Clinician: Referring Provider: Treating Provider/Extender: [name], [name] in Treatment: 1 Active Problems ICD-10 Encounter Code Description Active Date MDM Diagnosis I89.0 Lymphedema, not elsewhere classified 10/09/2020 No Yes L97.221 Non-pressure chronic ulcer of left calf limited to breakdown of skin 10/09/2020 No Yes L30.4 Erythema intertrigo 10/09/2020 No Yes B37.89 Other sites of candidiasis 10/09/2020 No Yes Inactive Problems Resolved Problems Electronic Signature(s) Signed: 10/16/2020 5:00:15 PM By: Linton Ham MD Entered By: Linton Ham on 10/16/2020 13:20:59 -------------------------------------------------------------------------------- Progress Note Details Patient Name: Date of Service: [facility], [name] Teresa B. 10/16/2020 12:30 PM Medical Record Number: [id number] Patient Account Number: [id number] Date of Birth/Sex: Treating RN: 1961-01-22 (59 y.o. [name] Primary Care Provider: Freda Munro Other Clinician: Referring Provider: Treating Provider/Extender: [name], [name] in Treatment: 1 Subjective History of Present Illness (HPI) ADMISSION 10/09/2020 This is a 59 year old woman who is a type I diabetic with a last hemoglobin A1c of 10. She is referred from her primary doctor after developing a refractory rash in the folds of her lower abdominal pannus. There is no open wound here however extensive intertrigo with classic satellite lesions highly suggestive of candidal infection. This is reasonably extensive on the lower abdominal pannus the superior part of her pubic symphysis all the way into the lower abdomen. She has been using nystatin powder and she did receive a course of Diflucan  apparently for 10 days from her primary physician although I did not see this anywhere. I think things are somewhat better. Additionally the patient also brought up the fact that she has a small dime sized area on her left posterior calf and asked [place] to look at this. This is been there  for about a month. She does not wear compression stockings. Last sleep she pointed out a rash on her left buttock and left lower back she has been scratching at this. There is no open wound here per se. 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 She has chronic venous insufficiency and lymphedema here she will need stockings. The intertrigo in her lower abdominal pannus still is not much better. I gave her additional 2 weeks of Diflucan and topical nystatin. Perhaps minimally better. She is going to need to be more aggressive about separating the folds and keeping this clean and dry. She has a much better looking area under the left breast Objective Constitutional Patient is hypertensive.. Pulse regular and within target range for patient.Marland Kitchen Respirations regular, non-labored and within target range.. Temperature is normal and within the target range for the patient.Marland Kitchen Appears in no distress. Vitals Time Taken: 12:44 PM, Height: 61 in, Weight: 300 lbs, BMI: 56.7, Temperature: 98.2 F, Pulse: 73 bpm, Respiratory Rate: 20 breaths/min, Blood Pressure: 163/66 mmHg, Capillary Blood Glucose: 278 mg/dl. General Notes: glucose per pt report General Notes: Wound exam ooHer pannus still does not look that good. I was hoping for better. It looks like satellite lesion so I was quite confident that this was  Candida and gave her nystatin ointment. I prefer this versus the powder since it stays in contact. Should not come in with any separation of the pannus folds. ooShe has lymphedema in her left lower leg the wound however has healed. Integumentary (Hair, Skin) Wound #1 status is Open. Original cause of wound was Gradually Appeared. The wound is located on the Left,Posterior Lower Leg. The wound measures 0cm length x 0cm width x 0cm depth; 0cm^2 area and 0cm^3 volume. There is no tunneling or undermining noted. There is a none present amount of drainage noted. There is no granulation within the wound bed. There is no necrotic tissue within the wound bed. Wound #2 status is Open. Original cause of wound was Gradually Appeared. The wound is located on the Groin. The wound measures 0cm length x 0cm width x 0cm depth; 0cm^2 area and 0cm^3 volume. There is no tunneling or undermining noted. There is a none present amount of drainage noted. There is no granulation within the wound bed. There is no necrotic tissue within the wound bed. Assessment Active Problems ICD-10 Lymphedema, not elsewhere classified Non-pressure chronic ulcer of left calf limited to breakdown of skin Erythema intertrigo Other sites of candidiasis Plan Discharge From [facility] Services: Discharge from [place] - Patient to follow up with primary care provider. Skin Barriers/Peri-Wound Care: Moisturizing lotion - apply lotion to both legs nightly. Other: - Nystatin cream daily to skin folds under abdomen and groin. Cleanse groin and abdomen daily with soap and water. lay down and allow skin folds to dry before dressing then apply towels or sheets to skinfolds. Keep skin folds clean and dry. Apply clean dry towels or sheets between skin folds to aid in allowing separation to decrease moisture. Patient may also purchase interdry and use in place of towels. [email or web address] Edema Control: Avoid standing for long periods of  time Elevate legs to the level of the heart or above for 30 minutes daily and/or  when sitting, a frequency of: - throughout the day. Exercise regularly Support Garment 20-30 mm/Hg pressure to: - patient to purchase compression stockings from [employer]. Apply in the morning and remove at night. 1. I continued the nystatin ointment for now 2. Continue the Diflucan 3. The area on the left leg is healed we gave her measurements for compression stockings which will she will need bilaterally 4. With regards to the pannus she needs to keep these areas separated. There is no easy way to do this. Interdry is a good product that you can purchase on [employer] however rolled hand towels are probably the best way to do this especially when the pannus fold is as large as in this case Electronic Signature(s) Signed: 10/16/2020 5:00:15 PM By: Linton Ham MD Entered By: Linton Ham on 10/16/2020 13:24:06 -------------------------------------------------------------------------------- SuperBill Details Patient Name: Date of Service: [facility], [name] Teresa B. 10/16/2020 Medical Record Number: [id number] Patient Account Number: [id number] Date of Birth/Sex: Treating RN: 1960-12-09 (59 y.o. [name] Primary Care Provider: Freda Munro Other Clinician: Referring Provider: Treating Provider/Extender: [name], [name] in Treatment: 1 Diagnosis Coding ICD-10 Codes Code Description I89.0 Lymphedema, not elsewhere classified L97.221 Non-pressure chronic ulcer of left calf limited to breakdown of skin L30.4 Erythema intertrigo B37.89 Other sites of candidiasis Facility Procedures CPT4 Code: [id number] Description: 25638 - WOUND CARE VISIT-LEV 3 EST PT Modifier: Quantity: 1 Physician Procedures : CPT4 Code Description Modifier [id number] - [place] PHYS LEVEL 2 - EST PT ICD-10 Diagnosis Description I89.0 Lymphedema, not elsewhere classified L97.221 Non-pressure  chronic ulcer of left calf limited to breakdown of skin L30.4 Erythema intertrigo Quantity: 1 Electronic Signature(s) Signed: 10/16/2020 5:00:15 PM By: Linton Ham MD Signed: 10/16/2020 5:09:44 PM By: Deon Pilling Entered By: Deon Pilling on 10/16/2020 14:02:48

## 2020-10-21 NOTE — Progress Notes (Signed)
[name], BIER ([id number]) Visit Report for 10/16/2020 Arrival Information Details Patient Name: Date of Service: [facility] [name], [name] [name] 10/16/2020 12:30 PM Medical Record Number: [id number] Patient Account Number: [id number] Date of Birth/Sex: Treating RN: May 18, 1961 (59 y.o. [name] Primary Care Burrell Hodapp: Freda Munro Other Clinician: Referring Keitra Carusone: Treating Kaiulani Sitton/Extender: [name], [name] in Treatment: 1 Visit Information History Since Last Visit Added or deleted any medications: No Patient Arrived: Ambulatory Any new allergies or adverse reactions: No Arrival Time: 12:43 Had a fall or experienced change in No Accompanied By: alone activities of daily living that may affect Transfer Assistance: None risk of falls: Patient Identification Verified: Yes Signs or symptoms of abuse/neglect since last visito No Secondary Verification Process Completed: Yes Hospitalized since last visit: No Patient Requires Transmission-Based Precautions: No Implantable device outside of the clinic excluding No Patient Has Alerts: No cellular tissue based products placed in the center since last visit: Has Dressing in Place as Prescribed: Yes Has Compression in Place as Prescribed: Yes Pain Present Now: No Electronic Signature(s) Signed: 10/20/2020 5:51:53 PM By: Levan Hurst RN, BSN Entered By: Levan Hurst on 10/16/2020 12:44:09 -------------------------------------------------------------------------------- Clinic Level of Care Assessment Details Patient Name: Date of Service: [facility] [place], [name] [name] 10/16/2020 12:30 PM Medical Record Number: [id number] Patient Account Number: [id number] Date of Birth/Sex: Treating RN: [birth year] (59 y.o. [name], [name] Primary Care Tami Blass: Freda Munro Other Clinician: Referring Debora Stockdale: Treating Kamerin Axford/Extender: [name], [name] in Treatment:  1 Clinic Level of Care Assessment Items TOOL 4 Quantity Score X- 1 0 Use when only an EandM is performed on FOLLOW-UP visit ASSESSMENTS - Nursing Assessment / Reassessment X- 1 10 Reassessment of Co-morbidities (includes updates in patient status) X- 1 5 Reassessment of Adherence to Treatment Plan ASSESSMENTS - Wound and Skin A ssessment / Reassessment X - Simple Wound Assessment / Reassessment - one wound 1 5 []  - 0 Complex Wound Assessment / Reassessment - multiple wounds X- 1 10 Dermatologic / Skin Assessment (not related to wound area) ASSESSMENTS - Focused Assessment X- 1 5 Circumferential Edema Measurements - multi extremities X- 1 10 Nutritional Assessment / Counseling / Intervention []  - 0 Lower Extremity Assessment (monofilament, tuning fork, pulses) []  - 0 Peripheral Arterial Disease Assessment (using hand held doppler) ASSESSMENTS - Ostomy and/or Continence Assessment and Care []  - 0 Incontinence Assessment and Management []  - 0 Ostomy Care Assessment and Management (repouching, etc.) PROCESS - Coordination of Care []  - 0 Simple Patient / Family Education for ongoing care X- 1 20 Complex (extensive) Patient / Family Education for ongoing care X- 1 10 Staff obtains Programmer, systems, Records, T Results / Process Orders est []  - 0 Staff telephones HHA, Nursing Homes / Clarify orders / etc []  - 0 Routine Transfer to another Facility (non-emergent condition) []  - 0 Routine Hospital Admission (non-emergent condition) []  - 0 New Admissions / Biomedical engineer / Ordering NPWT Apligraf, etc. , []  - 0 Emergency Hospital Admission (emergent condition) []  - 0 Simple Discharge Coordination X- 1 15 Complex (extensive) Discharge Coordination PROCESS - Special Needs []  - 0 Pediatric / Minor Patient Management []  - 0 Isolation Patient Management []  - 0 Hearing / Language / Visual special needs []  - 0 Assessment of Community assistance (transportation, D/C  planning, etc.) []  - 0 Additional assistance / Altered mentation []  - 0 Support Surface(s) Assessment (bed, cushion, seat, etc.) INTERVENTIONS - Wound Cleansing / Measurement []  - 0  Simple Wound Cleansing - one wound X- 1 5 Complex Wound Cleansing - multiple wounds X- 1 5 Wound Imaging (photographs - any number of wounds) []  - 0 Wound Tracing (instead of photographs) []  - 0 Simple Wound Measurement - one wound X- 1 5 Complex Wound Measurement - multiple wounds INTERVENTIONS - Wound Dressings []  - 0 Small Wound Dressing one or multiple wounds []  - 0 Medium Wound Dressing one or multiple wounds []  - 0 Large Wound Dressing one or multiple wounds []  - 0 Application of Medications - topical []  - 0 Application of Medications - injection INTERVENTIONS - Miscellaneous []  - 0 External ear exam []  - 0 Specimen Collection (cultures, biopsies, blood, body fluids, etc.) []  - 0 Specimen(s) / Culture(s) sent or taken to Lab for analysis []  - 0 Patient Transfer (multiple staff / Civil Service fast streamer / Similar devices) []  - 0 Simple Staple / Suture removal (25 or less) []  - 0 Complex Staple / Suture removal (26 or more) []  - 0 Hypo / Hyperglycemic Management (close monitor of Blood Glucose) []  - 0 Ankle / Brachial Index (ABI) - do not check if billed separately X- 1 5 Vital Signs Has the patient been seen at the hospital within the last three years: Yes Total Score: 110 Level Of Care: New/Established - Level 3 Electronic Signature(s) Signed: 10/16/2020 5:09:44 PM By: Deon Pilling Entered By: Deon Pilling on 10/16/2020 14:02:38 -------------------------------------------------------------------------------- Encounter Discharge Information Details Patient Name: Date of Service: [facility], [name] [name] 10/16/2020 12:30 PM Medical Record Number: [id number] Patient Account Number: [id number] Date of Birth/Sex: Treating RN: [birth year] (59 y.o. [name] Primary Care Eurydice Calixto:  Freda Munro Other Clinician: Referring Treyvonne Tata: Treating Aedin Jeansonne/Extender: [name], [name] in Treatment: 1 Encounter Discharge Information Items Discharge Condition: Stable Ambulatory Status: Ambulatory Discharge Destination: Home Transportation: Private Auto Accompanied By: self Schedule Follow-up Appointment: No Clinical Summary of Care: Notes leg measurements, elastic therapy numbered provided, and physician orders given to patient. Electronic Signature(s) Signed: 10/16/2020 5:09:44 PM By: Deon Pilling Entered By: Deon Pilling on 10/16/2020 14:03:33 -------------------------------------------------------------------------------- Lower Extremity Assessment Details Patient Name: Date of Service: [name], [name] [name] 10/16/2020 12:30 PM Medical Record Number: [id number] Patient Account Number: [id number] Date of Birth/Sex: Treating RN: Aug 16, 1961 (59 y.o. [name] Primary Care Samyra Limb: Freda Munro Other Clinician: Referring Javious Hallisey: Treating Mariapaula Krist/Extender: [name], [name] in Treatment: 1 Edema Assessment Assessed: [Left: No] [Right: No] Edema: [Left: Ye] [Right: s] Calf Left: Right: Point of Measurement: 41 cm From Medial Instep 44 cm Ankle Left: Right: Point of Measurement: 11 cm From Medial Instep 22 cm Vascular Assessment Pulses: Dorsalis Pedis Palpable: [Left:Yes] Electronic Signature(s) Signed: 10/20/2020 5:51:53 PM By: Levan Hurst RN, BSN Entered By: Levan Hurst on 10/16/2020 12:47:26 -------------------------------------------------------------------------------- Multi Wound Chart Details Patient Name: Date of Service: [facility], [name] [name] 10/16/2020 12:30 PM Medical Record Number: [id number] Patient Account Number: [id number] Date of Birth/Sex: Treating RN: 11/25/1961 (60 y.o. [name] Primary Care Alika Eppes: Freda Munro Other  Clinician: Referring Sarann Tregre: Treating Dann Galicia/Extender: [name], [name] in Treatment: 1 Vital Signs Height(in): 61 Capillary Blood Glucose(mg/dl): 278 Weight(lbs): 300 Pulse(bpm): 55 Body Mass Index(BMI): 52 Blood Pressure(mmHg): 163/66 Temperature(F): 98.2 Respiratory Rate(breaths/min): 20 Photos: [1:No Photos Left, Posterior Lower Leg] [2:No Photos Groin] [N/A:N/A N/A] Wound Location: [1:Gradually Appeared] [2:Gradually Appeared] [N/A:N/A] Wounding Event: [1:Diabetic Wound/Ulcer of the Lower] [2:Fungal] [N/A:N/A] Primary Etiology: [1:Extremity Asthma, Sleep  Apnea, Congestive] [2:Asthma, Sleep Apnea, Congestive] [N/A:N/A] Comorbid History: [1:Heart Failure, Coronary Artery Disease, Hypertension, Type I Diabetes, Neuropathy 09/05/2020] [2:Heart Failure, Coronary Artery Disease, Hypertension, Type I Diabetes, Neuropathy 09/05/2020] [N/A:N/A] Date Acquired: [1:1] [2:1] [N/A:N/A] Weeks of Treatment: [1:Open] [2:Open] [N/A:N/A] Wound Status: [1:0x0x0] [2:0x0x0] [N/A:N/A] Measurements L x W x D (cm) [1:0] [2:0] [N/A:N/A] A (cm) : rea [1:0] [2:0] [N/A:N/A] Volume (cm) : [1:100.00%] [2:100.00%] [N/A:N/A] % Reduction in Area: [1:100.00%] [2:100.00%] [N/A:N/A] % Reduction in Volume: [1:Grade 2] [2:Full Thickness Without Exposed] [N/A:N/A] Classification: [1:None Present] [2:Support Structures None Present] [N/A:N/A] Exudate Amount: [1:None Present (0%)] [2:None Present (0%)] [N/A:N/A] Granulation Amount: [1:None Present (0%)] [2:None Present (0%)] [N/A:N/A] Necrotic Amount: [1:Fascia: No] [2:Fascia: No] [N/A:N/A] Exposed Structures: [1:Fat Layer (Subcutaneous Tissue): No Fat Layer (Subcutaneous Tissue): No Tendon: No Muscle: No Joint: No Bone: No Large (67-100%)] [2:Tendon: No Muscle: No Joint: No Bone: No Large (67-100%)] [N/A:N/A] Treatment Notes Electronic Signature(s) Signed: 10/16/2020 5:00:15 PM By: Linton Ham MD Signed: 10/16/2020 5:09:44 PM  By: Deon Pilling Entered By: Linton Ham on 10/16/2020 13:21:05 -------------------------------------------------------------------------------- Multi-Disciplinary Care Plan Details Patient Name: Date of Service: [facility] [place], [name] [name] 10/16/2020 12:30 PM Medical Record Number: [id number] Patient Account Number: [id number] Date of Birth/Sex: Treating RN: Jan 26, 1961 (59 y.o. [name] Primary Care Mavin Dyke: Freda Munro Other Clinician: Referring Khylan Sawyer: Treating Kealani Leckey/Extender: [name], [name] in Treatment: 1 Active Inactive Electronic Signature(s) Signed: 10/16/2020 5:09:44 PM By: Deon Pilling Entered By: Deon Pilling on 10/16/2020 14:03:54 -------------------------------------------------------------------------------- Pain Assessment Details Patient Name: Date of Service: [name], [name] [name] 10/16/2020 12:30 PM Medical Record Number: [id number] Patient Account Number: [id number] Date of Birth/Sex: Treating RN: [birth year] (59 y.o. [name] Primary Care Ingris Pasquarella: Freda Munro Other Clinician: Referring Anahita Cua: Treating Carren Blakley/Extender: [name], [name] in Treatment: 1 Active Problems Location of Pain Severity and Description of Pain Patient Has Paino No Site Locations Pain Management and Medication Current Pain Management: Electronic Signature(s) Signed: 10/20/2020 5:51:53 PM By: Levan Hurst RN, BSN Entered By: Levan Hurst on 10/16/2020 12:44:42 -------------------------------------------------------------------------------- Patient/Caregiver Education Details Patient Name: Date of Service: [facility], [place] 11/11/2021andnbsp12:30 PM Medical Record Number: [id number] Patient Account Number: [id number] Date of Birth/Gender: Treating RN: Dec 29, 1960 (59 y.o. [name] Primary Care Physician: Freda Munro Other Clinician: Referring  Physician: Treating Physician/Extender: [name], [name] in Treatment: 1 Education Assessment Education Provided To: Patient Education Topics Provided Elevated Blood Sugar/ Impact on Healing: Handouts: Elevated Blood Sugars: How Do They Affect Wound Healing Methods: Explain/Verbal Responses: Reinforcements needed Electronic Signature(s) Signed: 10/16/2020 5:09:44 PM By: Deon Pilling Entered By: Deon Pilling on 10/16/2020 13:04:10 -------------------------------------------------------------------------------- Wound Assessment Details Patient Name: Date of Service: [name], [name] [name] 10/16/2020 12:30 PM Medical Record Number: [id number] Patient Account Number: [id number] Date of Birth/Sex: Treating RN: 1961-11-09 (59 y.o. [name] Primary Care Osmin Welz: Freda Munro Other Clinician: Referring Mackenzy Eisenberg: Treating Jayson Waterhouse/Extender: [name], [name] in Treatment: 1 Wound Status Wound Number: 1 Primary Diabetic Wound/Ulcer of the Lower Extremity Etiology: Wound Location: Left, Posterior Lower Leg Wound Open Wounding Event: Gradually Appeared Status: Date Acquired: 09/05/2020 Comorbid Asthma, Sleep Apnea, Congestive Heart Failure, Coronary Artery Weeks Of Treatment: 1 History: Disease, Hypertension, Type I Diabetes, Neuropathy Clustered Wound: No Wound Measurements Length: (cm) Width: (cm) Depth: (cm) Area: (cm) Volume: (cm) 0 % Reduction in Area: 100% 0 % Reduction in Volume: 100%  0 Epithelialization: Large (67-100%) 0 Tunneling: No 0 Undermining: No Wound Description Classification: Grade 2 Exudate Amount: None Present Foul Odor After Cleansing: No Slough/Fibrino No Wound Bed Granulation Amount: None Present (0%) Exposed Structure Necrotic Amount: None Present (0%) Fascia Exposed: No Fat Layer (Subcutaneous Tissue) Exposed: No Tendon Exposed: No Muscle Exposed: No Joint Exposed:  No Bone Exposed: No Electronic Signature(s) Signed: 10/20/2020 5:51:53 PM By: Levan Hurst RN, BSN Entered By: Levan Hurst on 10/16/2020 12:56:00 -------------------------------------------------------------------------------- Wound Assessment Details Patient Name: Date of Service: [facility], [place] [name] 10/16/2020 12:30 PM Medical Record Number: [id number] Patient Account Number: [id number] Date of Birth/Sex: Treating RN: May 12, 1961 (59 y.o. [name] Primary Care Eunice Winecoff: Freda Munro Other Clinician: Referring Meira Wahba: Treating Emilynn Srinivasan/Extender: [name], [name] in Treatment: 1 Wound Status Wound Number: 2 Primary Fungal Etiology: Wound Location: Groin Wound Open Wounding Event: Gradually Appeared Status: Date Acquired: 09/05/2020 Comorbid Asthma, Sleep Apnea, Congestive Heart Failure, Coronary Artery Weeks Of Treatment: 1 History: Disease, Hypertension, Type I Diabetes, Neuropathy Clustered Wound: No Wound Measurements Length: (cm) Width: (cm) Depth: (cm) Area: (cm) Volume: (cm) 0 % Reduction in Area: 100% 0 % Reduction in Volume: 100% 0 Epithelialization: Large (67-100%) 0 Tunneling: No 0 Undermining: No Wound Description Classification: Full Thickness Without Exposed Support Structures Exudate Amount: None Present Foul Odor After Cleansing: No Slough/Fibrino No Wound Bed Granulation Amount: None Present (0%) Exposed Structure Necrotic Amount: None Present (0%) Fascia Exposed: No Fat Layer (Subcutaneous Tissue) Exposed: No Tendon Exposed: No Muscle Exposed: No Joint Exposed: No Bone Exposed: No Electronic Signature(s) Signed: 10/20/2020 5:51:53 PM By: Levan Hurst RN, BSN Entered By: Levan Hurst on 10/16/2020 12:56:10 -------------------------------------------------------------------------------- [place]Brien Details Patient Name: Date of Service: [facility], [place] [name] 10/16/2020 12:30  PM Medical Record Number: [id number] Patient Account Number: [id number] Date of Birth/Sex: Treating RN: 1961-10-16 (58 y.o. [name] Primary Care Nidal Rivet: Freda Munro Other Clinician: Referring Solyana Nonaka: Treating Dejanae Helser/Extender: [name], [name] in Treatment: 1 Vital Signs Time Taken: 12:44 Temperature (F): 98.2 Height (in): 61 Pulse (bpm): 73 Weight (lbs): 300 Respiratory Rate (breaths/min): 20 Body Mass Index (BMI): 56.7 Blood Pressure (mmHg): 163/66 Capillary Blood Glucose (mg/dl): 278 Reference Range: 80 - 120 mg / dl Notes glucose per pt report Electronic Signature(s) Signed: 10/20/2020 5:51:53 PM By: Levan Hurst RN, BSN Entered By: Levan Hurst on 10/16/2020 12:44:37

## 2021-08-21 ENCOUNTER — Ambulatory Visit: Payer: Medicare Other | Admitting: Endocrinology

## 2021-09-09 ENCOUNTER — Ambulatory Visit: Payer: Medicare Other

## 2021-09-22 ENCOUNTER — Other Ambulatory Visit: Payer: Self-pay

## 2021-09-22 ENCOUNTER — Ambulatory Visit: Payer: Medicare Other | Attending: Adult Health Nurse Practitioner

## 2021-09-22 DIAGNOSIS — Z9181 History of falling: Secondary | ICD-10-CM | POA: Insufficient documentation

## 2021-09-22 DIAGNOSIS — M6281 Muscle weakness (generalized): Secondary | ICD-10-CM | POA: Diagnosis present

## 2021-09-22 NOTE — Therapy (Signed)
[facility] [place], [place], [address] Phone: [phone]   Fax:  [phone]  Physical Therapy Evaluation  Patient Details  Name: [name] MRN: [id number] Date of Birth: 04/14/1961 Referring Provider (PT): [name]   Encounter Date: 09/22/2021   PT End of Session - 09/22/21 1117     Visit Number 1    Number of Visits 12    Date for PT Re-Evaluation 11/03/21    PT Start Time 9833    PT Stop Time 1200    PT Time Calculation (min) 37 min    Activity Tolerance Patient tolerated treatment well    Behavior During Therapy [facility] for tasks assessed/performed             Past Medical History:  Diagnosis Date   Asthma    COPD (chronic obstructive pulmonary disease) ([place])    Depression    Diabetes mellitus    Diastolic dysfunction 07/07/5052   Hypertension    Prolonged QT interval 05/14/2015    Past Surgical History:  Procedure Laterality Date   CATARACT EXTRACTION     CESAREAN SECTION     CHOLECYSTECTOMY      There were no vitals filed for this visit.    Subjective Assessment - 09/22/21 1125     Subjective Patient reports that she started falling about a year ago. She notes one fall in particular where she hit her head when she fell at church. She wen the emergency room following this fall, but is unable to recall any additional information from this fall. She notes that her most recent fall was about 2 months ago at home. She was trying to step down to get into her bathroom.    Pertinent History Multiple falls, diabetic, patient reports open wounds on both feet (currently seeing Dr. Irving Shows for this)    How long can you stand comfortably? 5 minutes    How long can you walk comfortably? 5 minutes    Patient Stated Goals dancing, improving balance, walk and shop for groceries    Currently in Pain? Yes    Pain Score 9     Pain Location Foot    Pain Orientation Right;Left    Pain Descriptors / Indicators  Throbbing    Pain Type Neuropathic pain    Pain Onset More than a month ago    Pain Frequency Constant    Aggravating Factors  no known    Pain Relieving Factors none known    Effect of Pain on Daily Activities not able to be as active                [facility] PT Assessment - 09/22/21 0001       Assessment   Medical Diagnosis Multiple falls    Referring Provider (PT) [name]    Onset Date/Surgical Date --   February 2021   Next MD Visit --   unable to recall, but sometime next week   Prior Therapy No      Precautions   Precautions None      Balance Screen   Has the patient fallen in the past 6 months Yes    How many times? 4    Has the patient had a decrease in activity level because of a fear of falling?  Yes    Is the patient reluctant to leave their home because of a fear of falling?  Yes      [facility]  residence    Type of [place] Access Stairs to enter    Entrance Stairs-Number of Steps 2    Entrance Stairs-Rails None    Home Layout Multi-level    Alternate Level Stairs-Number of Steps 2    Alternate Level Stairs-Rails None      Prior Function   Level of Independence Independent      Cognition   Overall Cognitive Status Within Functional Limits for tasks assessed    Attention Focused    Focused Attention Appears intact    Memory Appears intact    Awareness Appears intact    Problem Solving Appears intact      Sensation   Additional Comments Patient reports numbness and tingling in both hands and feet      ROM / Strength   AROM / PROM / Strength Strength      Strength   Strength Assessment Site Hip;Knee;Ankle    Right/Left Hip Right;Left    Right Hip Flexion 4-/5    Left Hip Flexion 4/5    Right/Left Knee Right;Left    Right Knee Flexion 4/5    Right Knee Extension 5/5    Left Knee Flexion 4+/5    Left Knee Extension 5/5    Right/Left Ankle Right;Left      Transfers   Transfers Sit to Stand     Sit to Stand 4: Min assist    Five time sit to stand comments  unable to perform sit to stand without UE support      Balance   Balance Assessed Yes      Static Standing Balance   Static Standing - Balance Support No upper extremity supported    Static Standing Balance -  Activities  Sharpened Romberg - Eyes Open    Static Standing - Comment/# of Minutes Rhomberg: no limitation; Semi-tandem: significant unsteadiness   R leading: 8 seconds, L leadin : 12 seconds                       Objective measurements completed on examination: See above findings.       [facility] Adult PT Treatment/Exercise - 09/22/21 0001       Exercises   Exercises Knee/Hip      Knee/Hip Exercises: Seated   Long Arc Quad Both;20 reps    Marching Both;20 reps                     PT Education - 09/22/21 1218     Education Details Balance, POC, HEP    Person(s) Educated Patient    Methods Explanation    Comprehension Verbalized understanding              PT Short Term Goals - 09/22/21 1225       PT SHORT TERM GOAL #1   Title Patient will be able to stand at least 10 minutes to perform her daily household activities.    Baseline 5 minutes    Time 3    Period Weeks    Status New    Target Date 10/13/21      PT SHORT TERM GOAL #2   Title Patient will be able to walk at least 10 minutes to perform her daily household activities.    Baseline 5 minutes    Time 3    Period Weeks    Status New    Target Date 10/13/21  PT Long Term Goals - 09/22/21 1227       PT LONG TERM GOAL #1   Title Patient will be independent with her HEP.    Time 6    Period Weeks    Status New    Target Date 11/03/21      PT LONG TERM GOAL #2   Title Patient will be able to stand and walk at least 20 minutes for improved function grocery shopping.    Baseline 5 minutes    Time 6    Period Weeks    Status New    Target Date 11/03/21      PT LONG TERM GOAL #3    Title Patient will be able to safely navigate at least 2 6" steps without upper extremity assistance for impmroved function entering and exiting her house.    Time 6    Period Weeks    Status New    Target Date 11/03/21                    Plan - 09/22/21 1118     Clinical Impression Statement Patient is a 60 year old female presenting to physical therapy following multiple falls in the last year. Her fall risk is elevated due to her lower extremity weakness, history of falls, and diabetic neuropathy in both feet. She would benefit from skilled physical therapy to address these impairments to safely return to her prior level of function.    Personal Factors and Comorbidities Fitness;Time since onset of injury/illness/exacerbation;Comorbidity 1;Comorbidity 2    Comorbidities Obesity, wounds of both feet    Examination-Activity Limitations Transfers;Stairs;Stand    Examination-Participation Restrictions Shop    Stability/Clinical Decision Making Unstable/Unpredictable    Clinical Decision Making High    Rehab Potential Fair    PT Frequency 2x / week    PT Duration 6 weeks    PT Treatment/Interventions Neuromuscular re-education;Balance training;Therapeutic exercise;Therapeutic activities;Gait training;Stair training;Functional mobility training;Patient/family education;Energy conservation;ADLs/Self Care Home Management    PT Next Visit Plan Balance and activities for improved lower extremity endurance, FOTO    PT Home Exercise Plan LAQ and seated marching (2x10 each)    Consulted and Agree with Plan of Care Patient             Patient will benefit from skilled therapeutic intervention in order to improve the following deficits and impairments:  Difficulty walking, Decreased activity tolerance, Pain, Decreased balance, Decreased strength, Impaired sensation  Visit Diagnosis: History of falling - Plan: PT plan of care cert/re-cert  Muscle weakness (generalized) - Plan: PT  plan of care cert/re-cert     Problem List Patient Active Problem List   Diagnosis Date Noted   Morbid obesity ([place]) 01/29/2020   Educated about COVID-19 virus infection 01/29/2020   OSA (obstructive sleep apnea) 12/21/2019   Asthma 03/88/8280   Diastolic dysfunction 03/49/1791   Prolonged QT interval 05/14/2015   Palpitations 07/23/2014   Generalized weakness 07/23/2014   Diabetes mellitus ([place]) 07/22/2014   Hypertension 07/22/2014   Depression 07/22/2014    Darlin Coco, PT 09/22/2021, 12:35 PM  [facility] [address] [place], [place], [address] Phone: [phone]   Fax:  [phone]  Name: BONNELL PLACZEK MRN: [id number] Date of Birth: 06-21-1961

## 2021-09-25 ENCOUNTER — Ambulatory Visit: Payer: Medicare Other

## 2021-09-28 ENCOUNTER — Ambulatory Visit: Payer: Medicare Other

## 2021-10-15 ENCOUNTER — Other Ambulatory Visit: Payer: Self-pay

## 2021-10-15 ENCOUNTER — Ambulatory Visit: Payer: Medicare Other | Attending: Adult Health Nurse Practitioner

## 2021-10-15 DIAGNOSIS — M6281 Muscle weakness (generalized): Secondary | ICD-10-CM

## 2021-10-15 DIAGNOSIS — Z9181 History of falling: Secondary | ICD-10-CM

## 2021-10-15 NOTE — Therapy (Addendum)
[facility] [place], [place], [address] Phone: [phone]   Fax:  [phone]  Physical Therapy Treatment  Patient Details  Name: [name] MRN: [id number] Date of Birth: [birth year] Referring Provider (PT): [name]   Encounter Date: 10/15/2021   PT End of Session - 10/15/21 1536     Visit Number 2    Number of Visits 12    Date for PT Re-Evaluation 11/03/21    PT Start Time 6384    PT Stop Time 1600    PT Time Calculation (min) 29 min    Activity Tolerance Patient tolerated treatment well    Behavior During Therapy [facility] for tasks assessed/performed             Past Medical History:  Diagnosis Date   Asthma    COPD (chronic obstructive pulmonary disease) ([place])    Depression    Diabetes mellitus    Diastolic dysfunction 05/11/5992   Hypertension    Prolonged QT interval 05/14/2015    Past Surgical History:  Procedure Laterality Date   CATARACT EXTRACTION     CESAREAN SECTION     CHOLECYSTECTOMY      There were no vitals filed for this visit.   Subjective Assessment - 10/15/21 1533     Subjective Patient reports that her right knee is hurting today. She notes that it has been bothering her about a week. She also feels short of breath .    Pertinent History Multiple falls, diabetic, patient reports open wounds on both feet (currently seeing Dr. Irving Shows for this)    How long can you stand comfortably? 5 minutes    How long can you walk comfortably? 5 minutes    Patient Stated Goals dancing, improving balance, walk and shop for groceries    Currently in Pain? Yes    Pain Score 7     Pain Location Knee    Pain Orientation Right    Pain Type Chronic pain    Pain Onset More than a month ago                               [facility] Adult PT Treatment/Exercise - 10/15/21 0001       Knee/Hip Exercises: Standing   Hip Flexion Both;20 reps;Knee straight    Hip Extension Both;20  reps;Knee straight      Knee/Hip Exercises: Seated   Long Arc Quad Both;20 reps    Other Seated Knee/Hip Exercises Heel/toe raises   3 minutes   Other Seated Knee/Hip Exercises Side steps   24 reps each; alternating LE   Marching Both;20 reps      Knee/Hip Exercises: Supine   Knee Flexion Both;20 reps   Seated                      PT Short Term Goals - 09/22/21 1225       PT SHORT TERM GOAL #1   Title Patient will be able to stand at least 10 minutes to perform her daily household activities.    Baseline 5 minutes    Time 3    Period Weeks    Status New    Target Date 10/13/21      PT SHORT TERM GOAL #2   Title Patient will be able to walk at least 10 minutes to perform her daily household activities.    Baseline 5 minutes  Time 3    Period Weeks    Status New    Target Date 10/13/21               PT Long Term Goals - 09/22/21 1227       PT LONG TERM GOAL #1   Title Patient will be independent with her HEP.    Time 6    Period Weeks    Status New    Target Date 11/03/21      PT LONG TERM GOAL #2   Title Patient will be able to stand and walk at least 20 minutes for improved function grocery shopping.    Baseline 5 minutes    Time 6    Period Weeks    Status New    Target Date 11/03/21      PT LONG TERM GOAL #3   Title Patient will be able to safely navigate at least 2 6" steps without upper extremity assistance for impmroved function entering and exiting her house.    Time 6    Period Weeks    Status New    Target Date 11/03/21                   Plan - 10/15/21 1541     Clinical Impression Statement Patient was introduced to multiple newinterventions for improved lower extremity strength and endurance. She required minimal cuing with seated marching and sidestepping to limit her trunk mobility to isolate hip musculature. She required multiple standing and seated rest breaks throughout treatment due to fatigue. She reported  feeling good upon the conclusion of treatment. She continues to require skilled physical therapy to address her remaining impairments to safely maximize her functional mobility.    Personal Factors and Comorbidities Fitness;Time since onset of injury/illness/exacerbation;Comorbidity 1;Comorbidity 2    Comorbidities Obesity, wounds of both feet    Examination-Activity Limitations Transfers;Stairs;Stand    Examination-Participation Restrictions Shop    Stability/Clinical Decision Making Unstable/Unpredictable    Rehab Potential Fair    PT Frequency 2x / week    PT Duration 6 weeks    PT Treatment/Interventions Neuromuscular re-education;Balance training;Therapeutic exercise;Therapeutic activities;Gait training;Stair training;Functional mobility training;Patient/family education;Energy conservation;ADLs/Self Care Home Management    PT Next Visit Plan Balance and activities for improved lower extremity endurance,    PT Home Exercise Plan LAQ and seated marching (2x10 each)    Consulted and Agree with Plan of Care Patient             Patient will benefit from skilled therapeutic intervention in order to improve the following deficits and impairments:  Difficulty walking, Decreased activity tolerance, Pain, Decreased balance, Decreased strength, Impaired sensation  Visit Diagnosis: History of falling  Muscle weakness (generalized)     Problem List Patient Active Problem List   Diagnosis Date Noted   Morbid obesity ([place]) 01/29/2020   Educated about COVID-19 virus infection 01/29/2020   OSA (obstructive sleep apnea) 12/21/2019   Asthma 16/09/9603   Diastolic dysfunction 54/08/8118   Prolonged QT interval 05/14/2015   Palpitations 07/23/2014   Generalized weakness 07/23/2014   Diabetes mellitus ([place]) 07/22/2014   Hypertension 07/22/2014   Depression 07/22/2014    Darlin Coco, PT 10/15/2021, 5:45 PM  [facility] [place], [place], [address] Phone: [phone]   Fax:  [phone]  Name: [name] MRN: [id number] Date of Birth: 25-Nov-1961  PHYSICAL THERAPY DISCHARGE SUMMARY  Visits from Start of Care: 2  Current functional  level related to goals / functional outcomes: Patient is being discharged at this time as she has not returned to physical therapy since her last appointment on 10/15/21.    Remaining deficits: No significant changes since her initial evaluation   Education / Equipment: HEP    Patient agrees to discharge. Patient goals were not met. Patient is being discharged due to not returning since the last visit.

## 2021-10-20 ENCOUNTER — Ambulatory Visit: Payer: Medicare Other

## 2021-11-06 ENCOUNTER — Emergency Department (HOSPITAL_COMMUNITY): Payer: Medicare Other

## 2021-11-06 ENCOUNTER — Encounter (HOSPITAL_COMMUNITY): Payer: Self-pay | Admitting: *Deleted

## 2021-11-06 ENCOUNTER — Emergency Department (HOSPITAL_COMMUNITY)
Admission: EM | Admit: 2021-11-06 | Discharge: 2021-11-06 | Disposition: A | Discharge status: Home / Self Care | Payer: Medicare Other | Attending: Emergency Medicine | Admitting: Emergency Medicine

## 2021-11-06 ENCOUNTER — Other Ambulatory Visit: Payer: Self-pay

## 2021-11-06 DIAGNOSIS — Z7722 Contact with and (suspected) exposure to environmental tobacco smoke (acute) (chronic): Secondary | ICD-10-CM | POA: Diagnosis not present

## 2021-11-06 DIAGNOSIS — E11621 Type 2 diabetes mellitus with foot ulcer: Secondary | ICD-10-CM | POA: Diagnosis not present

## 2021-11-06 DIAGNOSIS — E1165 Type 2 diabetes mellitus with hyperglycemia: Secondary | ICD-10-CM | POA: Insufficient documentation

## 2021-11-06 DIAGNOSIS — L03115 Cellulitis of right lower limb: Secondary | ICD-10-CM | POA: Insufficient documentation

## 2021-11-06 DIAGNOSIS — J45909 Unspecified asthma, uncomplicated: Secondary | ICD-10-CM | POA: Insufficient documentation

## 2021-11-06 DIAGNOSIS — L97521 Non-pressure chronic ulcer of other part of left foot limited to breakdown of skin: Secondary | ICD-10-CM | POA: Diagnosis not present

## 2021-11-06 DIAGNOSIS — Z79899 Other long term (current) drug therapy: Secondary | ICD-10-CM | POA: Diagnosis not present

## 2021-11-06 DIAGNOSIS — Z794 Long term (current) use of insulin: Secondary | ICD-10-CM | POA: Insufficient documentation

## 2021-11-06 DIAGNOSIS — I10 Essential (primary) hypertension: Secondary | ICD-10-CM | POA: Diagnosis not present

## 2021-11-06 DIAGNOSIS — W19XXXA Unspecified fall, initial encounter: Secondary | ICD-10-CM

## 2021-11-06 DIAGNOSIS — M79604 Pain in right leg: Secondary | ICD-10-CM | POA: Diagnosis present

## 2021-11-06 DIAGNOSIS — E0865 Diabetes mellitus due to underlying condition with hyperglycemia: Secondary | ICD-10-CM

## 2021-11-06 DIAGNOSIS — Z20822 Contact with and (suspected) exposure to covid-19: Secondary | ICD-10-CM | POA: Insufficient documentation

## 2021-11-06 DIAGNOSIS — J449 Chronic obstructive pulmonary disease, unspecified: Secondary | ICD-10-CM | POA: Diagnosis not present

## 2021-11-06 DIAGNOSIS — L97511 Non-pressure chronic ulcer of other part of right foot limited to breakdown of skin: Secondary | ICD-10-CM | POA: Insufficient documentation

## 2021-11-06 DIAGNOSIS — E876 Hypokalemia: Secondary | ICD-10-CM | POA: Diagnosis not present

## 2021-11-06 DIAGNOSIS — L97519 Non-pressure chronic ulcer of other part of right foot with unspecified severity: Secondary | ICD-10-CM

## 2021-11-06 LAB — CBC WITH DIFFERENTIAL/PLATELET
Abs Immature Granulocytes: 0.03 10*3/uL (ref 0.00–0.07)
Basophils Absolute: 0.1 10*3/uL (ref 0.0–0.1)
Basophils Relative: 1 %
Eosinophils Absolute: 0.3 10*3/uL (ref 0.0–0.5)
Eosinophils Relative: 3 %
HCT: 39 % (ref 36.0–46.0)
Hemoglobin: 12.7 g/dL (ref 12.0–15.0)
Immature Granulocytes: 0 %
Lymphocytes Relative: 16 %
Lymphs Abs: 1.4 10*3/uL (ref 0.7–4.0)
MCH: 26.6 pg (ref 26.0–34.0)
MCHC: 32.6 g/dL (ref 30.0–36.0)
MCV: 81.8 fL (ref 80.0–100.0)
Monocytes Absolute: 0.6 10*3/uL (ref 0.1–1.0)
Monocytes Relative: 6 %
Neutro Abs: 6.5 10*3/uL (ref 1.7–7.7)
Neutrophils Relative %: 74 %
Platelets: 395 10*3/uL (ref 150–400)
RBC: 4.77 MIL/uL (ref 3.87–5.11)
RDW: 15 % (ref 11.5–15.5)
WBC: 8.9 10*3/uL (ref 4.0–10.5)
nRBC: 0 % (ref 0.0–0.2)

## 2021-11-06 LAB — BASIC METABOLIC PANEL
Anion gap: 9 (ref 5–15)
BUN: 6 mg/dL (ref 6–20)
CO2: 32 mmol/L (ref 22–32)
Calcium: 8.6 mg/dL — ABNORMAL LOW (ref 8.9–10.3)
Chloride: 97 mmol/L — ABNORMAL LOW (ref 98–111)
Creatinine, Ser: 0.79 mg/dL (ref 0.44–1.00)
GFR, Estimated: >60 mL/min (ref >60)
Glucose, Bld: 197 mg/dL — ABNORMAL HIGH (ref 70–99)
Potassium: 2.8 mmol/L — ABNORMAL LOW (ref 3.5–5.1)
Sodium: 138 mmol/L (ref 135–145)

## 2021-11-06 LAB — RESP PANEL BY RT-PCR (FLU A&B, COVID) ARPGX2
Influenza A by PCR: NEGATIVE
Influenza B by PCR: NEGATIVE
SARS Coronavirus 2 by RT PCR: NEGATIVE

## 2021-11-06 LAB — SEDIMENTATION RATE: Sed Rate: 71 mm/hr — ABNORMAL HIGH (ref 0–22)

## 2021-11-06 LAB — MAGNESIUM: Magnesium: 1.6 mg/dL — ABNORMAL LOW (ref 1.7–2.4)

## 2021-11-06 IMAGING — DX DG FOOT 2V*R*
2 series · 2 of 2 positions shown · non-contrast
Comparison: None.

CLINICAL DATA: Fall, ulcers

EXAM:
RIGHT TIBIA AND FIBULA - 2 VIEW; RIGHT FOOT - 2 VIEW; LEFT FOOT - 2
VIEW

[foot ap]
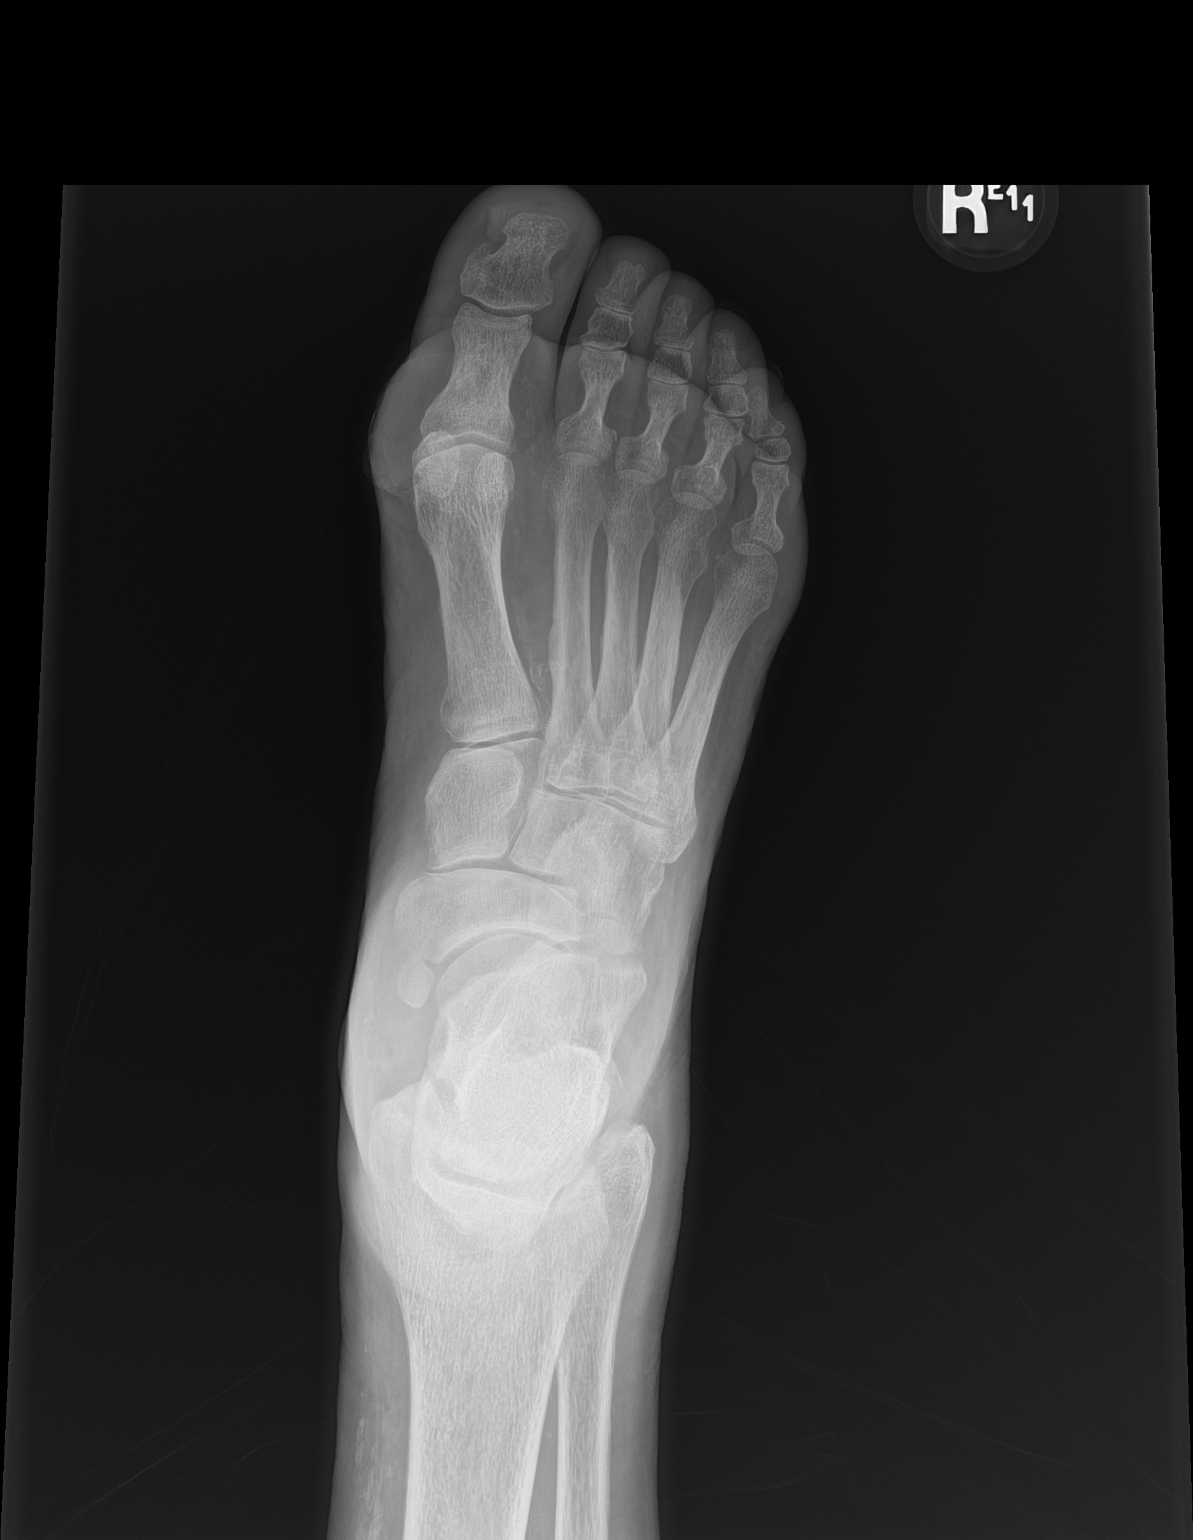

[foot lat]
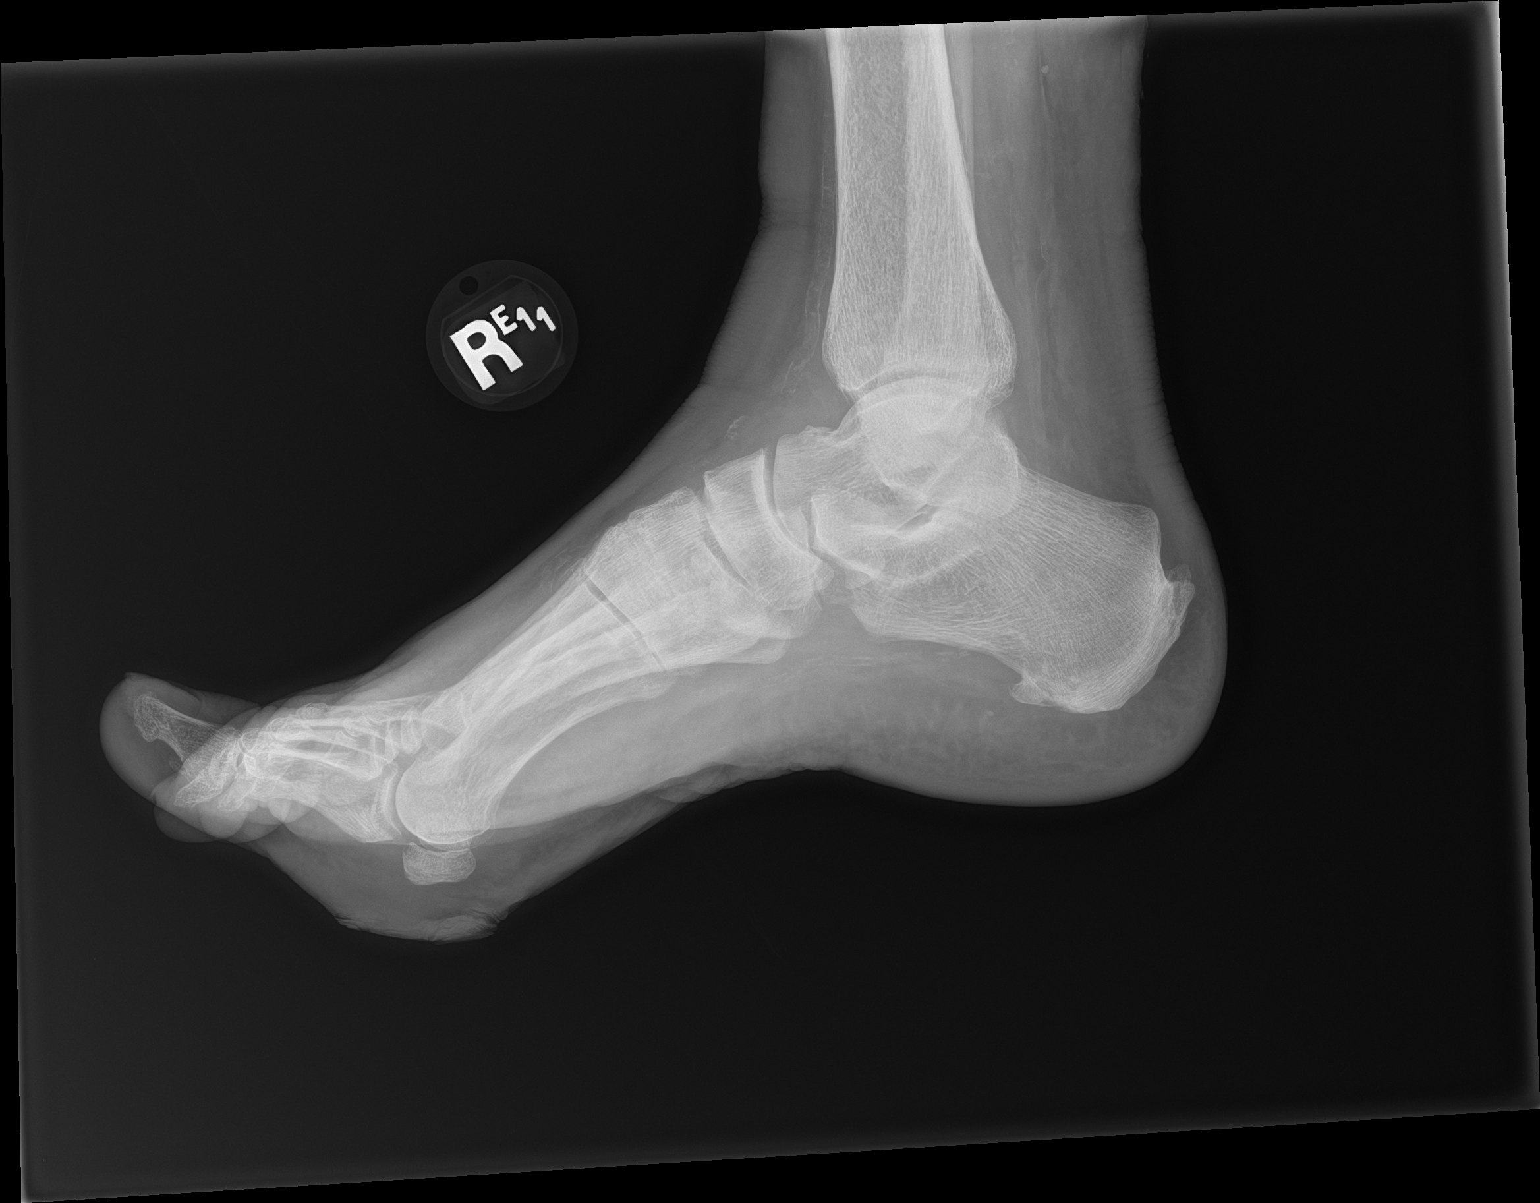

[2 of 2 positions shown; findings below may reference images not displayed]

FINDINGS: Right tibia, fibula and right foot: No evidence of fracture or
dislocation. Vascular calcifications. Soft tissue swelling seen
about the right first MTP joint. No osseous destruction or
periosteal reaction. No significant degenerative changes.

Left foot: No evidence fracture or dislocation. Vascular
calcifications. Ulcer the superficial soft tissues overlying the
first MTP joint. No osseous destruction or periosteal reaction. No
significant degenerative changes.
IMPRESSION: No evidence of fracture or osteomyelitis.

## 2021-11-06 IMAGING — DX DG TIBIA/FIBULA 2V*R*
2 series · 2 of 2 positions shown · non-contrast
Comparison: None.

CLINICAL DATA: Fall, ulcers

EXAM:
RIGHT TIBIA AND FIBULA - 2 VIEW; RIGHT FOOT - 2 VIEW; LEFT FOOT - 2
VIEW

[tibia ap]
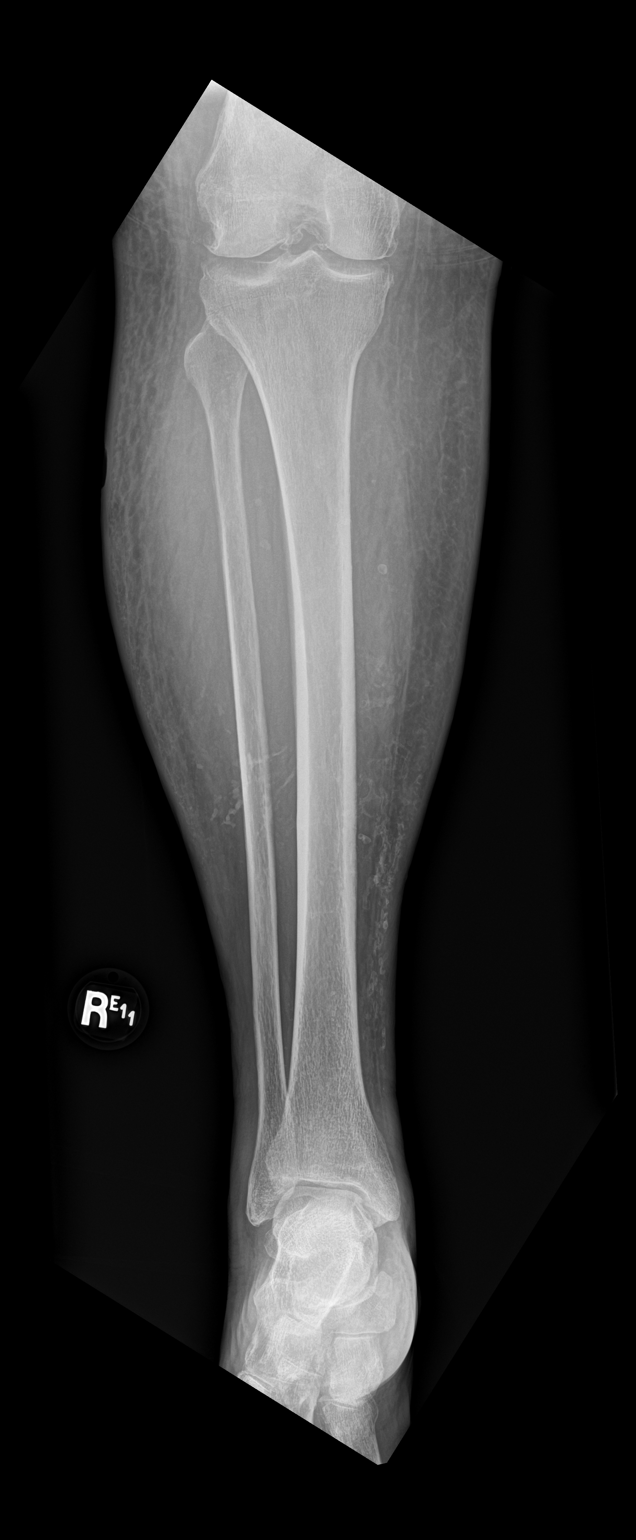

[tibia lat]
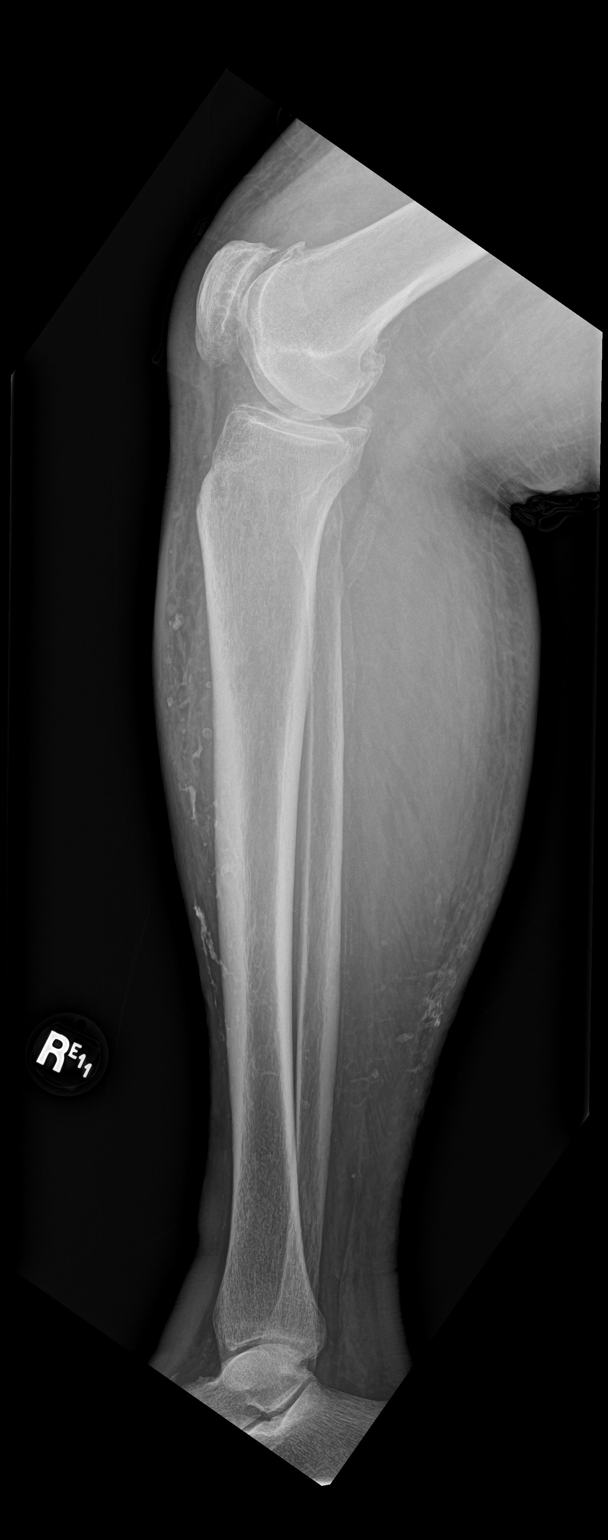

[2 of 2 positions shown; findings below may reference images not displayed]

FINDINGS: Right tibia, fibula and right foot: No evidence of fracture or
dislocation. Vascular calcifications. Soft tissue swelling seen
about the right first MTP joint. No osseous destruction or
periosteal reaction. No significant degenerative changes.

Left foot: No evidence fracture or dislocation. Vascular
calcifications. Ulcer the superficial soft tissues overlying the
first MTP joint. No osseous destruction or periosteal reaction. No
significant degenerative changes.
IMPRESSION: No evidence of fracture or osteomyelitis.

## 2021-11-06 IMAGING — DX DG FOOT 2V*L*
2 series · 2 of 2 positions shown · non-contrast
Comparison: None.

CLINICAL DATA: Fall, ulcers

EXAM:
RIGHT TIBIA AND FIBULA - 2 VIEW; RIGHT FOOT - 2 VIEW; LEFT FOOT - 2
VIEW

[foot ap]
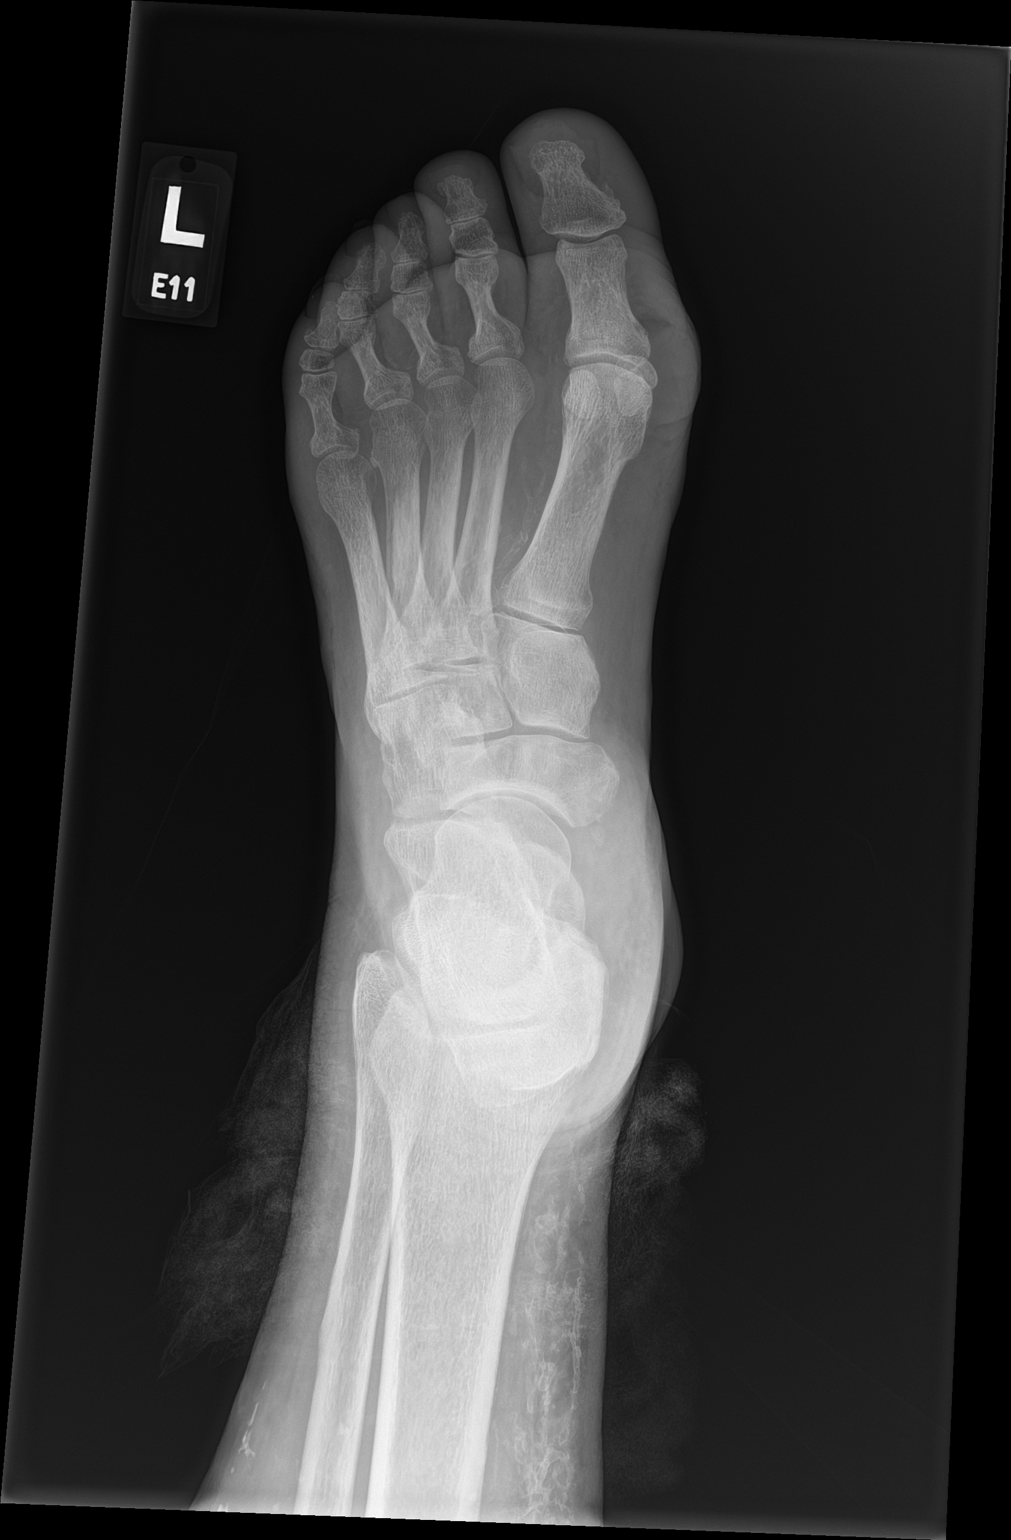

[foot lat]
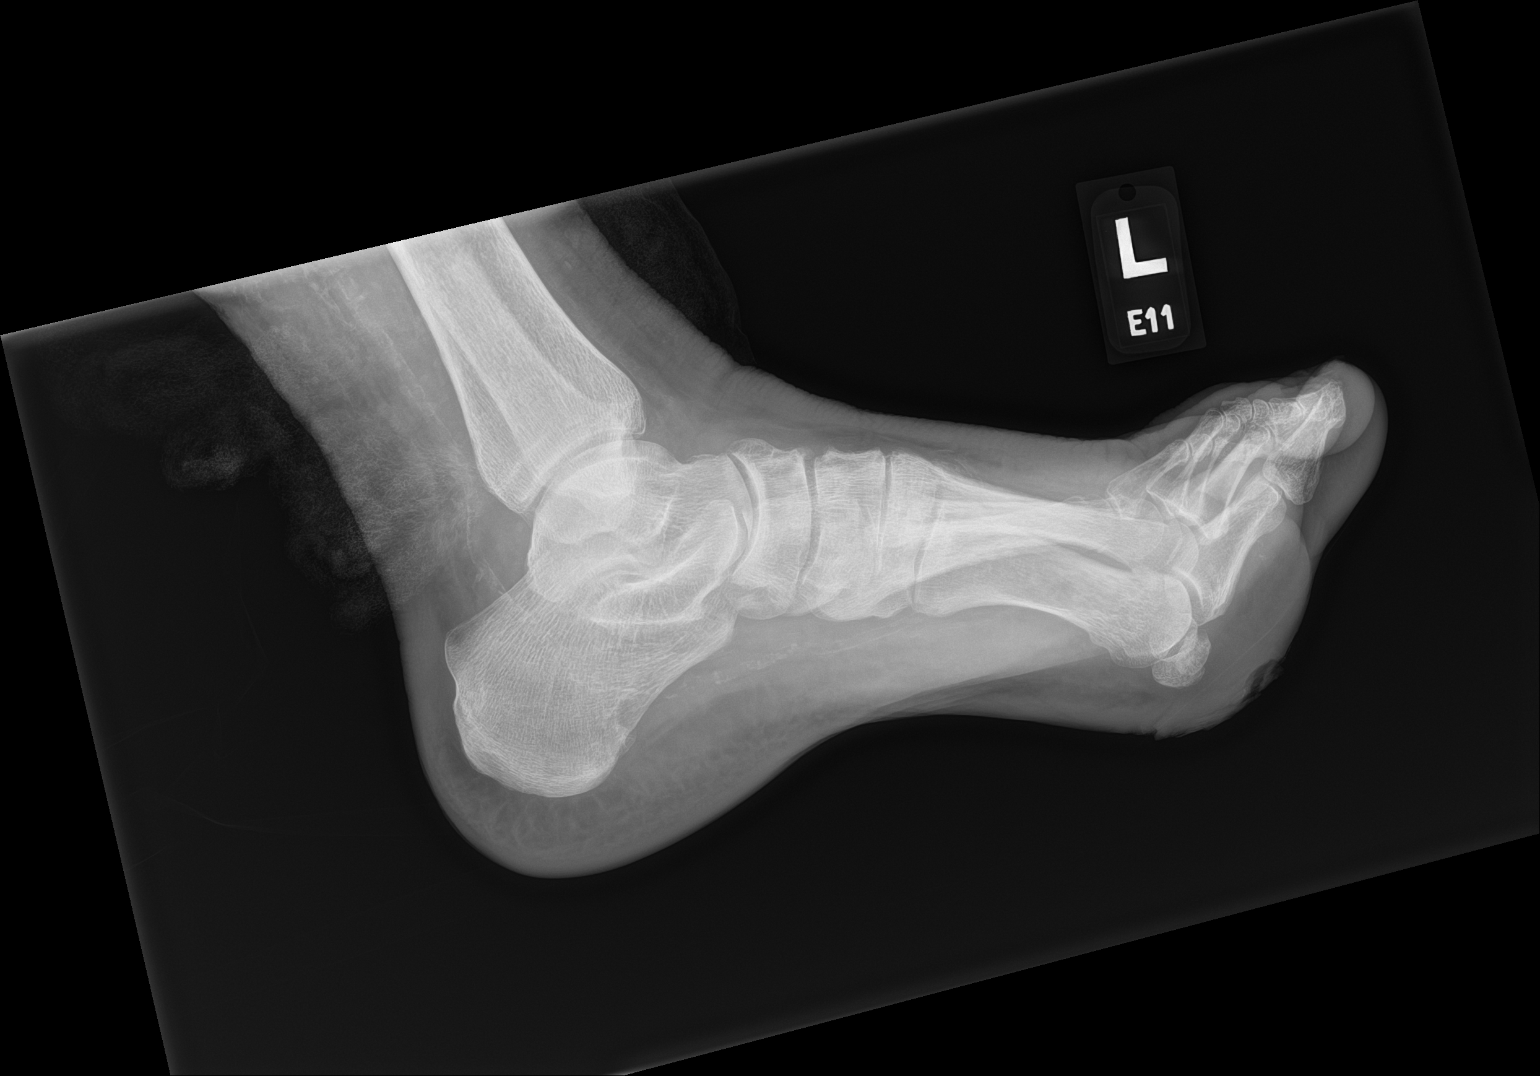

[2 of 2 positions shown; findings below may reference images not displayed]

FINDINGS: Right tibia, fibula and right foot: No evidence of fracture or
dislocation. Vascular calcifications. Soft tissue swelling seen
about the right first MTP joint. No osseous destruction or
periosteal reaction. No significant degenerative changes.

Left foot: No evidence fracture or dislocation. Vascular
calcifications. Ulcer the superficial soft tissues overlying the
first MTP joint. No osseous destruction or periosteal reaction. No
significant degenerative changes.
IMPRESSION: No evidence of fracture or osteomyelitis.

## 2021-11-06 MED ORDER — SODIUM CHLORIDE 0.9 % IV SOLN
1.5000 g | Freq: Once | INTRAVENOUS | Status: AC
  Administered 2021-11-06: 1.5 g via INTRAVENOUS
  Filled 2021-11-06: qty 4

## 2021-11-06 MED ORDER — SODIUM CHLORIDE 0.9 % IV BOLUS
1000.0000 mL | Freq: Once | INTRAVENOUS | Status: AC
  Administered 2021-11-06: 1000 mL via INTRAVENOUS

## 2021-11-06 MED ORDER — CEPHALEXIN 500 MG PO CAPS: 500.0000 mg | ORAL_CAPSULE | Freq: Four times a day (QID) | ORAL | 0 refills | Status: DC

## 2021-11-06 MED ORDER — MAGNESIUM SULFATE 2 GM/50ML IV SOLN
2.0000 g | Freq: Once | INTRAVENOUS | Status: AC
  Administered 2021-11-06: 2 g via INTRAVENOUS
  Filled 2021-11-06: qty 50

## 2021-11-06 MED ORDER — POTASSIUM CHLORIDE CRYS ER 20 MEQ PO TBCR
40.0000 meq | EXTENDED_RELEASE_TABLET | Freq: Once | ORAL | Status: AC
  Administered 2021-11-06: 40 meq via ORAL
  Filled 2021-11-06: qty 2

## 2021-11-06 MED ORDER — POTASSIUM CHLORIDE 10 MEQ/100ML IV SOLN
10.0000 meq | Freq: Once | INTRAVENOUS | Status: AC
  Administered 2021-11-06: 10 meq via INTRAVENOUS
  Filled 2021-11-06: qty 100

## 2021-11-06 MED ORDER — DOXYCYCLINE HYCLATE 100 MG PO TABS
100.0000 mg | ORAL_TABLET | Freq: Once | ORAL | Status: AC
  Administered 2021-11-06: 100 mg via ORAL
  Filled 2021-11-06: qty 1

## 2021-11-06 MED ORDER — MAGNESIUM OXIDE -MG SUPPLEMENT 200 MG PO TABS: 1.0000 | ORAL_TABLET | Freq: Two times a day (BID) | ORAL | 0 refills | Status: DC

## 2021-11-06 MED ORDER — POTASSIUM CHLORIDE 10 MEQ/100ML IV SOLN
10.0000 meq | INTRAVENOUS | Status: AC
  Administered 2021-11-06 (×3): 10 meq via INTRAVENOUS
  Filled 2021-11-06 (×3): qty 100

## 2021-11-06 MED ORDER — DOXYCYCLINE HYCLATE 100 MG PO CAPS: 100.0000 mg | ORAL_CAPSULE | Freq: Two times a day (BID) | ORAL | 0 refills | Status: DC

## 2021-11-06 NOTE — ED Provider Notes (Signed)
4:15 PM-checkout from Dr.'s office to evaluate patient after treatment which has included antibiotics and potassium.  She has received a single dose of potassium, for level 2.8.  Magnesium level also has been added.  It is felt that her potassium level was low because of poor intake.  Patient does take Demadex, and potassium supplements.  5:01 PM- Magnesium low,  will give additional potassium and magnesium.  10:55 PM-medication treatment completed.  She remains comfortable.  Findings discussed with the patient and plan agreed upon.  MDM-patient with ongoing lower leg ulcers, and various managements recently.  Presenting now with discomfort.  She has been stabilized in the ED.  Metabolic disorder addressed with medication treatment both oral and IV.  Patient prefers to go home.  She plans on following up with wound care which she has previously done for the same problem.  Doubt significant cellulitis, hemodynamic collapse or metabolic disorder requiring hospitalization.   Daleen Bo, MD 11/07/21 1515

## 2021-11-06 NOTE — Discharge Instructions (Addendum)
Take antibiotics as prescribed and get rechecked on Monday by her primary doctor. Wash your wounds twice a day with soap and water.  You can also use warm compresses on them to help them heal.  After cleansing, rinse them and dry them well then put a light bandage on them. We sent prescriptions to your pharmacy. Have blood work rechecked next week. Take your potassium, twice a day as prescribed.  We are also treating you with magnesium to improve your hypokalemia. Use Tylenol every 4 hours and ibuprofen every 6 as needed for pain.

## 2021-11-06 NOTE — ED Notes (Signed)
Old soiled gauze wrapping removed form patient lower legs and this RN placed new ones. No sign of infection noted to the lower extremities.

## 2021-11-06 NOTE — ED Provider Notes (Signed)
[facility] Provider Note   CSN: [id number] Arrival date & time: 11/06/21  1246     History Chief Complaint  Patient presents with   Leg Pain    [name] is a 60 y.o. female.  Patient with history of diabetes, high blood pressure, COPD presents with worsening ulcers and redness to lower extremities worse on the right.  Patient said chronic wounds for a long time however she fell off the couch a week and a half ago and has had worsening ulcers redness and wounds to the right leg since then.  Difficulty with weight due to ulcers and discomfort.  No fevers or chills.  Mild nausea.  Symptoms fairly constant.  Difficulty with mobility due to these challenges.      Past Medical History:  Diagnosis Date   Asthma    COPD (chronic obstructive pulmonary disease) ([place])    Depression    Diabetes mellitus    Diastolic dysfunction 02/10/1695   Hypertension    Prolonged QT interval 05/14/2015    Patient Active Problem List   Diagnosis Date Noted   Morbid obesity ([place]) 01/29/2020   Educated about COVID-19 virus infection 01/29/2020   OSA (obstructive sleep apnea) 12/21/2019   Asthma 78/93/8101   Diastolic dysfunction 75/09/2584   Prolonged QT interval 05/14/2015   Palpitations 07/23/2014   Generalized weakness 07/23/2014   Diabetes mellitus ([place]) 07/22/2014   Hypertension 07/22/2014   Depression 07/22/2014    Past Surgical History:  Procedure Laterality Date   CATARACT EXTRACTION     CESAREAN SECTION     CHOLECYSTECTOMY       OB History     Gravida  3   Para  2   Term  2   Preterm      AB  1   Living  2      SAB  1   IAB      Ectopic      Multiple      Live Births              Family History  Problem Relation Age of Onset   Stroke Mother    Diabetes Father    Heart failure Father    Hypertension Father    Diabetes Sister    Heart failure Sister    Hypertension Sister    Stroke Sister    Cancer Other    Stroke  Sister     Social History   Tobacco Use   Smoking status: Passive Smoke Exposure - Never Smoker   Smokeless tobacco: Never  Vaping Use   Vaping Use: Never used  Substance Use Topics   Alcohol use: No    Alcohol/week: 0.0 standard drinks   Drug use: No    Home Medications Prior to Admission medications   Medication Sig Start Date End Date Taking? Authorizing Provider  albuterol (PROAIR HFA) 108 (90 BASE) MCG/ACT inhaler Inhale 2 puffs into the lungs every 6 (six) hours as needed for wheezing or shortness of breath.    [provider]  bethanechol (URECHOLINE) 25 MG tablet Take 25 mg by mouth every morning.  09/10/19   [provider]  esomeprazole (NEXIUM) 20 MG capsule Take 20 mg by mouth 2 (two) times daily before a meal.    [provider]  gabapentin (NEURONTIN) 100 MG capsule Take 100 mg by mouth 2 (two) times daily.    [provider]  hydrOXYzine (ATARAX/VISTARIL) 10 MG tablet Take 10 mg  by mouth every 8 (eight) hours as needed for itching or anxiety.  11/22/19   [provider]  insulin aspart protamine- aspart (NOVOLOG MIX 70/30) (70-30) 100 UNIT/ML injection Inject 60-80 Units into the skin See admin instructions. 80 units in the morning and 60 units in the evening    [provider]  potassium chloride SA (K-DUR) 20 MEQ tablet Take 20 mEq by mouth 2 (two) times daily.    [provider]  propranolol ER (INDERAL LA) 80 MG 24 hr capsule Take 80 mg by mouth daily. 12/28/19   [provider]  sertraline (ZOLOFT) 100 MG tablet Take 100 mg by mouth daily. 12/19/19   [provider]  torsemide (DEMADEX) 20 MG tablet Take 20 mg by mouth 2 (two) times daily.  03/28/18   [provider]  traZODone (DESYREL) 50 MG tablet Take 50 mg by mouth at bedtime.    [provider]    Allergies    Carvedilol, Benicar [olmesartan], Codeine, Sulfa antibiotics, and Trulicity [dulaglutide]  Review of  Systems   Review of Systems  Constitutional:  Positive for fatigue. Negative for chills and fever.  HENT:  Negative for congestion.   Eyes:  Negative for visual disturbance.  Respiratory:  Negative for shortness of breath.   Cardiovascular:  Negative for chest pain.  Gastrointestinal:  Positive for nausea. Negative for abdominal pain and vomiting.  Genitourinary:  Negative for dysuria and flank pain.  Musculoskeletal:  Negative for back pain, neck pain and neck stiffness.  Skin:  Positive for rash.  Neurological:  Negative for light-headedness and headaches.   Physical Exam Updated Vital Signs BP (!) 200/85 (BP Location: Right Arm)   Pulse 78   Temp 97.9 F (36.6 C) (Oral)   Resp 19   Wt 136.1 kg   LMP 08/06/2012   SpO2 96%   BMI 56.68 kg/m   Physical Exam Vitals and nursing note reviewed.  Constitutional:      General: She is not in acute distress.    Appearance: She is well-developed.  HENT:     Head: Normocephalic.     Mouth/Throat:     Mouth: Mucous membranes are dry.  Eyes:     General:        Right eye: No discharge.        Left eye: No discharge.     Conjunctiva/sclera: Conjunctivae normal.  Neck:     Trachea: No tracheal deviation.  Cardiovascular:     Rate and Rhythm: Normal rate.  Pulmonary:     Effort: Pulmonary effort is normal.  Abdominal:     General: There is no distension.     Palpations: Abdomen is soft.     Tenderness: There is no abdominal tenderness. There is no guarding.  Musculoskeletal:        General: Swelling and tenderness present. No deformity.     Cervical back: Normal range of motion and neck supple. No rigidity.  Skin:    General: Skin is warm.     Capillary Refill: Capillary refill takes less than 2 seconds.     Findings: Erythema, lesion and rash present.     Comments: Patient has chronic thickened skin changes bilateral lower extremities.  Patient has erythema and warmth worse in the right anterior and lateral leg with  superficial ulcerations approximate 3 cm each right mid lateral leg.  Mild tender to palpation.  Patient has foot ulcer plantar aspect distal foot bilateral more significant on the left without  purulence or surrounding erythema.  Neurological:     General: No focal deficit present.     Mental Status: She is alert.     Cranial Nerves: No cranial nerve deficit.  Psychiatric:        Mood and Affect: Mood normal.    ED Results / Procedures / Treatments   Labs (all labs ordered are listed, but only abnormal results are displayed) Labs Reviewed  BASIC METABOLIC PANEL - Abnormal; Notable for the following components:      Result Value   Potassium 2.8 (*)    Chloride 97 (*)    Glucose, Bld 197 (*)    Calcium 8.6 (*)    All other components within normal limits  RESP PANEL BY RT-PCR (FLU A&B, COVID) ARPGX2  CBC WITH DIFFERENTIAL/PLATELET  SEDIMENTATION RATE  MAGNESIUM    EKG None  Radiology DG Tibia/Fibula Right  Result Date: 11/06/2021 CLINICAL DATA:  Fall, ulcers EXAM: RIGHT TIBIA AND FIBULA - 2 VIEW; RIGHT FOOT - 2 VIEW; LEFT FOOT - 2 VIEW COMPARISON:  None. FINDINGS: Right tibia, fibula and right foot: No evidence of fracture or dislocation. Vascular calcifications. Soft tissue swelling seen about the right first MTP joint. No osseous destruction or periosteal reaction. No significant degenerative changes. Left foot: No evidence fracture or dislocation. Vascular calcifications. Ulcer the superficial soft tissues overlying the first MTP joint. No osseous destruction or periosteal reaction. No significant degenerative changes. IMPRESSION: No evidence of fracture or osteomyelitis. Electronically Signed   By: Yetta Glassman M.D.   On: 11/06/2021 15:32   DG Foot 2 Views Left  Result Date: 11/06/2021 CLINICAL DATA:  Fall, ulcers EXAM: RIGHT TIBIA AND FIBULA - 2 VIEW; RIGHT FOOT - 2 VIEW; LEFT FOOT - 2 VIEW COMPARISON:  None. FINDINGS: Right tibia, fibula and right foot: No evidence of  fracture or dislocation. Vascular calcifications. Soft tissue swelling seen about the right first MTP joint. No osseous destruction or periosteal reaction. No significant degenerative changes. Left foot: No evidence fracture or dislocation. Vascular calcifications. Ulcer the superficial soft tissues overlying the first MTP joint. No osseous destruction or periosteal reaction. No significant degenerative changes. IMPRESSION: No evidence of fracture or osteomyelitis. Electronically Signed   By: Yetta Glassman M.D.   On: 11/06/2021 15:32   DG Foot 2 Views Right  Result Date: 11/06/2021 CLINICAL DATA:  Fall, ulcers EXAM: RIGHT TIBIA AND FIBULA - 2 VIEW; RIGHT FOOT - 2 VIEW; LEFT FOOT - 2 VIEW COMPARISON:  None. FINDINGS: Right tibia, fibula and right foot: No evidence of fracture or dislocation. Vascular calcifications. Soft tissue swelling seen about the right first MTP joint. No osseous destruction or periosteal reaction. No significant degenerative changes. Left foot: No evidence fracture or dislocation. Vascular calcifications. Ulcer the superficial soft tissues overlying the first MTP joint. No osseous destruction or periosteal reaction. No significant degenerative changes. IMPRESSION: No evidence of fracture or osteomyelitis. Electronically Signed   By: Yetta Glassman M.D.   On: 11/06/2021 15:32    Procedures Procedures   Medications Ordered in ED Medications  potassium chloride SA (KLOR-CON M) CR tablet 40 mEq (has no administration in time range)  potassium chloride 10 mEq in 100 mL IVPB (has no administration in time range)  sodium chloride 0.9 % bolus 1,000 mL (1,000 mLs Intravenous New Bag/Given 11/06/21 1515)    ED Course  I have reviewed the triage vital signs and the nursing notes.  Pertinent labs & imaging results that were available during my care of  the patient were reviewed by me and considered in my medical decision making (see chart for details).    MDM  Rules/Calculators/A&P                           Patient presents with history of chronic and overall controlled diabetes chronic skin wounds and ulcerations.  Patient has had acute worsening with recent fall from the couch.  Patient has signs of cellulitis and worsening ulcers.  Plan for blood work, x-rays.  X-rays reviewed no acute fracture no signs of osteomyelitis.  White count normal hemoglobin unremarkable.  Mild hyperglycemia reviewed.  Hypokalemia 2.8.  IV and oral potassium ordered.  Patient care be signed out to reassess after potassium for final disposition.  Antibiotics for ulcers/cellulitis ordered.  IV fluids given.  Final Clinical Impression(s) / ED Diagnoses Final diagnoses:  Diabetes mellitus due to underlying condition with hyperglycemia, with long-term current use of insulin ([place])  Cellulitis of right leg  Skin ulcers of foot, bilateral ([place])  Fall, initial encounter    Rx / DC Orders ED Discharge Orders     None        Elnora Morrison, MD 11/06/21 [phone]

## 2021-11-06 NOTE — ED Notes (Signed)
Pt BP trending high. The edp made aware and not too concerned about it at this time. Will continue to monitor and update.

## 2021-11-06 NOTE — ED Triage Notes (Signed)
Ulcers on both lower legs

## 2021-11-21 ENCOUNTER — Emergency Department (HOSPITAL_COMMUNITY): Payer: Medicare Other

## 2021-11-21 ENCOUNTER — Other Ambulatory Visit: Payer: Self-pay

## 2021-11-21 ENCOUNTER — Inpatient Hospital Stay (HOSPITAL_COMMUNITY)
Admission: EM | Admit: 2021-11-21 | Discharge: 2021-11-27 | DRG: 622 | Disposition: A | Discharge status: Skilled Nursing Facility | Payer: Medicare Other | Attending: Family Medicine | Admitting: Family Medicine

## 2021-11-21 ENCOUNTER — Encounter (HOSPITAL_COMMUNITY): Payer: Self-pay

## 2021-11-21 DIAGNOSIS — L03116 Cellulitis of left lower limb: Secondary | ICD-10-CM | POA: Diagnosis present

## 2021-11-21 DIAGNOSIS — E1169 Type 2 diabetes mellitus with other specified complication: Principal | ICD-10-CM | POA: Diagnosis present

## 2021-11-21 DIAGNOSIS — E1165 Type 2 diabetes mellitus with hyperglycemia: Secondary | ICD-10-CM | POA: Diagnosis present

## 2021-11-21 DIAGNOSIS — N179 Acute kidney failure, unspecified: Secondary | ICD-10-CM | POA: Diagnosis present

## 2021-11-21 DIAGNOSIS — J449 Chronic obstructive pulmonary disease, unspecified: Secondary | ICD-10-CM | POA: Diagnosis present

## 2021-11-21 DIAGNOSIS — Z23 Encounter for immunization: Secondary | ICD-10-CM

## 2021-11-21 DIAGNOSIS — S91302S Unspecified open wound, left foot, sequela: Secondary | ICD-10-CM | POA: Diagnosis not present

## 2021-11-21 DIAGNOSIS — R748 Abnormal levels of other serum enzymes: Secondary | ICD-10-CM

## 2021-11-21 DIAGNOSIS — E872 Acidosis, unspecified: Secondary | ICD-10-CM | POA: Diagnosis present

## 2021-11-21 DIAGNOSIS — D75839 Thrombocytosis, unspecified: Secondary | ICD-10-CM | POA: Diagnosis not present

## 2021-11-21 DIAGNOSIS — E782 Mixed hyperlipidemia: Secondary | ICD-10-CM

## 2021-11-21 DIAGNOSIS — G4733 Obstructive sleep apnea (adult) (pediatric): Secondary | ICD-10-CM | POA: Diagnosis present

## 2021-11-21 DIAGNOSIS — I110 Hypertensive heart disease with heart failure: Secondary | ICD-10-CM | POA: Diagnosis present

## 2021-11-21 DIAGNOSIS — R9431 Abnormal electrocardiogram [ECG] [EKG]: Secondary | ICD-10-CM

## 2021-11-21 DIAGNOSIS — L089 Local infection of the skin and subcutaneous tissue, unspecified: Secondary | ICD-10-CM

## 2021-11-21 DIAGNOSIS — M869 Osteomyelitis, unspecified: Secondary | ICD-10-CM | POA: Diagnosis present

## 2021-11-21 DIAGNOSIS — M79606 Pain in leg, unspecified: Secondary | ICD-10-CM

## 2021-11-21 DIAGNOSIS — R531 Weakness: Secondary | ICD-10-CM | POA: Diagnosis present

## 2021-11-21 DIAGNOSIS — L03311 Cellulitis of abdominal wall: Secondary | ICD-10-CM | POA: Diagnosis present

## 2021-11-21 DIAGNOSIS — E11621 Type 2 diabetes mellitus with foot ulcer: Secondary | ICD-10-CM | POA: Diagnosis present

## 2021-11-21 DIAGNOSIS — E869 Volume depletion, unspecified: Secondary | ICD-10-CM | POA: Diagnosis present

## 2021-11-21 DIAGNOSIS — L03119 Cellulitis of unspecified part of limb: Secondary | ICD-10-CM | POA: Diagnosis not present

## 2021-11-21 DIAGNOSIS — Z794 Long term (current) use of insulin: Secondary | ICD-10-CM

## 2021-11-21 DIAGNOSIS — I878 Other specified disorders of veins: Secondary | ICD-10-CM

## 2021-11-21 DIAGNOSIS — I5033 Acute on chronic diastolic (congestive) heart failure: Secondary | ICD-10-CM

## 2021-11-21 DIAGNOSIS — E8809 Other disorders of plasma-protein metabolism, not elsewhere classified: Secondary | ICD-10-CM | POA: Diagnosis present

## 2021-11-21 DIAGNOSIS — E11628 Type 2 diabetes mellitus with other skin complications: Secondary | ICD-10-CM | POA: Diagnosis not present

## 2021-11-21 DIAGNOSIS — Z20822 Contact with and (suspected) exposure to covid-19: Secondary | ICD-10-CM | POA: Diagnosis present

## 2021-11-21 DIAGNOSIS — Z823 Family history of stroke: Secondary | ICD-10-CM

## 2021-11-21 DIAGNOSIS — I10 Essential (primary) hypertension: Secondary | ICD-10-CM

## 2021-11-21 DIAGNOSIS — E876 Hypokalemia: Secondary | ICD-10-CM | POA: Diagnosis present

## 2021-11-21 DIAGNOSIS — Z6841 Body Mass Index (BMI) 40.0 and over, adult: Secondary | ICD-10-CM | POA: Diagnosis not present

## 2021-11-21 DIAGNOSIS — L03115 Cellulitis of right lower limb: Secondary | ICD-10-CM | POA: Diagnosis present

## 2021-11-21 DIAGNOSIS — Z9049 Acquired absence of other specified parts of digestive tract: Secondary | ICD-10-CM

## 2021-11-21 DIAGNOSIS — D75838 Other thrombocytosis: Secondary | ICD-10-CM | POA: Diagnosis present

## 2021-11-21 DIAGNOSIS — Z79899 Other long term (current) drug therapy: Secondary | ICD-10-CM

## 2021-11-21 DIAGNOSIS — Z833 Family history of diabetes mellitus: Secondary | ICD-10-CM

## 2021-11-21 DIAGNOSIS — D72829 Elevated white blood cell count, unspecified: Secondary | ICD-10-CM | POA: Diagnosis not present

## 2021-11-21 DIAGNOSIS — E46 Unspecified protein-calorie malnutrition: Secondary | ICD-10-CM

## 2021-11-21 DIAGNOSIS — I5032 Chronic diastolic (congestive) heart failure: Secondary | ICD-10-CM

## 2021-11-21 DIAGNOSIS — D509 Iron deficiency anemia, unspecified: Secondary | ICD-10-CM

## 2021-11-21 DIAGNOSIS — L8990 Pressure ulcer of unspecified site, unspecified stage: Secondary | ICD-10-CM | POA: Insufficient documentation

## 2021-11-21 DIAGNOSIS — K219 Gastro-esophageal reflux disease without esophagitis: Secondary | ICD-10-CM | POA: Diagnosis not present

## 2021-11-21 DIAGNOSIS — S91302A Unspecified open wound, left foot, initial encounter: Secondary | ICD-10-CM

## 2021-11-21 DIAGNOSIS — G9341 Metabolic encephalopathy: Secondary | ICD-10-CM | POA: Diagnosis present

## 2021-11-21 DIAGNOSIS — Z8249 Family history of ischemic heart disease and other diseases of the circulatory system: Secondary | ICD-10-CM

## 2021-11-21 DIAGNOSIS — F32A Depression, unspecified: Secondary | ICD-10-CM | POA: Diagnosis present

## 2021-11-21 DIAGNOSIS — E118 Type 2 diabetes mellitus with unspecified complications: Secondary | ICD-10-CM

## 2021-11-21 DIAGNOSIS — L97529 Non-pressure chronic ulcer of other part of left foot with unspecified severity: Secondary | ICD-10-CM | POA: Diagnosis present

## 2021-11-21 DIAGNOSIS — R0902 Hypoxemia: Secondary | ICD-10-CM

## 2021-11-21 DIAGNOSIS — Z91199 Patient's noncompliance with other medical treatment and regimen due to unspecified reason: Secondary | ICD-10-CM

## 2021-11-21 LAB — URINALYSIS, ROUTINE W REFLEX MICROSCOPIC
Bilirubin Urine: NEGATIVE
Glucose, UA: 50 mg/dL — AB
Ketones, ur: 5 mg/dL — AB
Leukocytes,Ua: NEGATIVE
Nitrite: NEGATIVE
Protein, ur: >=300 mg/dL — AB
Specific Gravity, Urine: 1.016 (ref 1.005–1.030)
pH: 5 (ref 5.0–8.0)

## 2021-11-21 LAB — CBC WITH DIFFERENTIAL/PLATELET
Abs Immature Granulocytes: 0.14 10*3/uL — ABNORMAL HIGH (ref 0.00–0.07)
Basophils Absolute: 0.1 10*3/uL (ref 0.0–0.1)
Basophils Relative: 0 %
Eosinophils Absolute: 0 10*3/uL (ref 0.0–0.5)
Eosinophils Relative: 0 %
HCT: 45.3 % (ref 36.0–46.0)
Hemoglobin: 14.5 g/dL (ref 12.0–15.0)
Immature Granulocytes: 1 %
Lymphocytes Relative: 4 %
Lymphs Abs: 0.8 10*3/uL (ref 0.7–4.0)
MCH: 25.9 pg — ABNORMAL LOW (ref 26.0–34.0)
MCHC: 32 g/dL (ref 30.0–36.0)
MCV: 80.9 fL (ref 80.0–100.0)
Monocytes Absolute: 1.1 10*3/uL — ABNORMAL HIGH (ref 0.1–1.0)
Monocytes Relative: 6 %
Neutro Abs: 16.7 10*3/uL — ABNORMAL HIGH (ref 1.7–7.7)
Neutrophils Relative %: 89 %
Platelets: 458 10*3/uL — ABNORMAL HIGH (ref 150–400)
RBC: 5.6 MIL/uL — ABNORMAL HIGH (ref 3.87–5.11)
RDW: 15.1 % (ref 11.5–15.5)
WBC: 18.8 10*3/uL — ABNORMAL HIGH (ref 4.0–10.5)
nRBC: 0 % (ref 0.0–0.2)

## 2021-11-21 LAB — COMPREHENSIVE METABOLIC PANEL
ALT: 55 U/L — ABNORMAL HIGH (ref 0–44)
AST: 177 U/L — ABNORMAL HIGH (ref 15–41)
Albumin: 2.6 g/dL — ABNORMAL LOW (ref 3.5–5.0)
Alkaline Phosphatase: 118 U/L (ref 38–126)
Anion gap: 12 (ref 5–15)
BUN: 18 mg/dL (ref 6–20)
CO2: 32 mmol/L (ref 22–32)
Calcium: 9 mg/dL (ref 8.9–10.3)
Chloride: 97 mmol/L — ABNORMAL LOW (ref 98–111)
Creatinine, Ser: 1.27 mg/dL — ABNORMAL HIGH (ref 0.44–1.00)
GFR, Estimated: 48 mL/min — ABNORMAL LOW (ref >60)
Glucose, Bld: 254 mg/dL — ABNORMAL HIGH (ref 70–99)
Potassium: 4 mmol/L (ref 3.5–5.1)
Sodium: 141 mmol/L (ref 135–145)
Total Bilirubin: 1.1 mg/dL (ref 0.3–1.2)
Total Protein: 7 g/dL (ref 6.5–8.1)

## 2021-11-21 LAB — LIPASE, BLOOD: Lipase: 18 U/L (ref 11–51)

## 2021-11-21 LAB — RESP PANEL BY RT-PCR (FLU A&B, COVID) ARPGX2
Influenza A by PCR: NEGATIVE
Influenza B by PCR: NEGATIVE
SARS Coronavirus 2 by RT PCR: NEGATIVE

## 2021-11-21 LAB — LACTIC ACID, PLASMA: Lactic Acid, Venous: 2 mmol/L (ref 0.5–1.9)

## 2021-11-21 LAB — CBG MONITORING, ED: Glucose-Capillary: 250 mg/dL — ABNORMAL HIGH (ref 70–99)

## 2021-11-21 LAB — MAGNESIUM: Magnesium: 2 mg/dL (ref 1.7–2.4)

## 2021-11-21 IMAGING — DX DG FOOT COMPLETE 3+V*L*
3 series · 3 of 3 positions shown · non-contrast
Comparison: [DATE]

CLINICAL DATA: Wounds to both feet x2 weeks.

EXAM:
LEFT FOOT - COMPLETE 3+ VIEW

[foot ap]
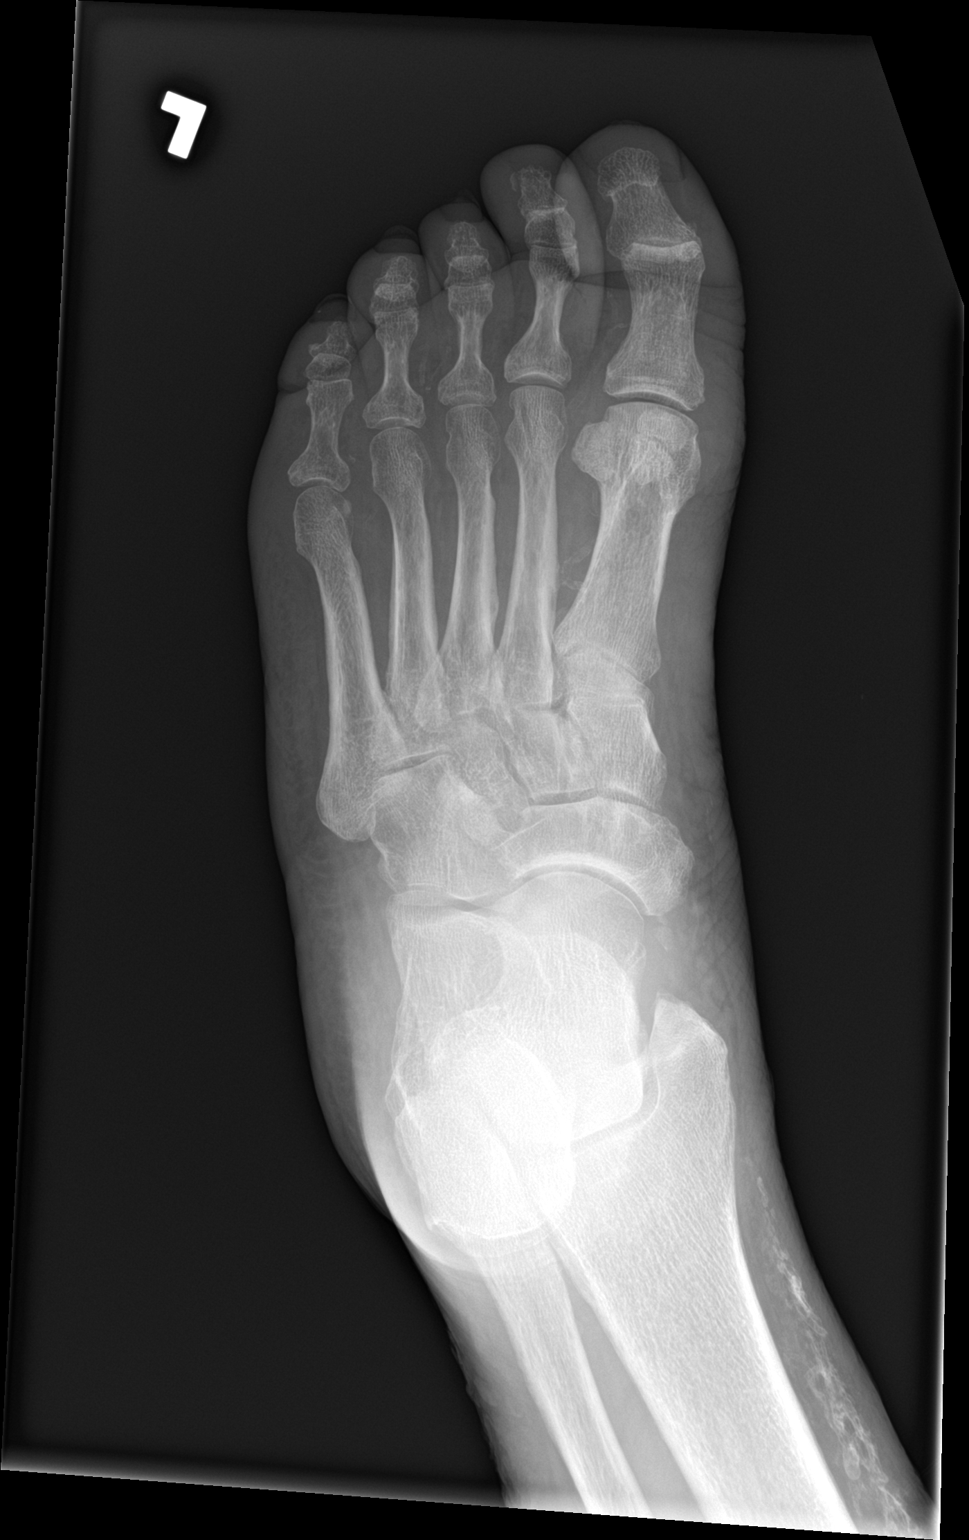

[foot obl]
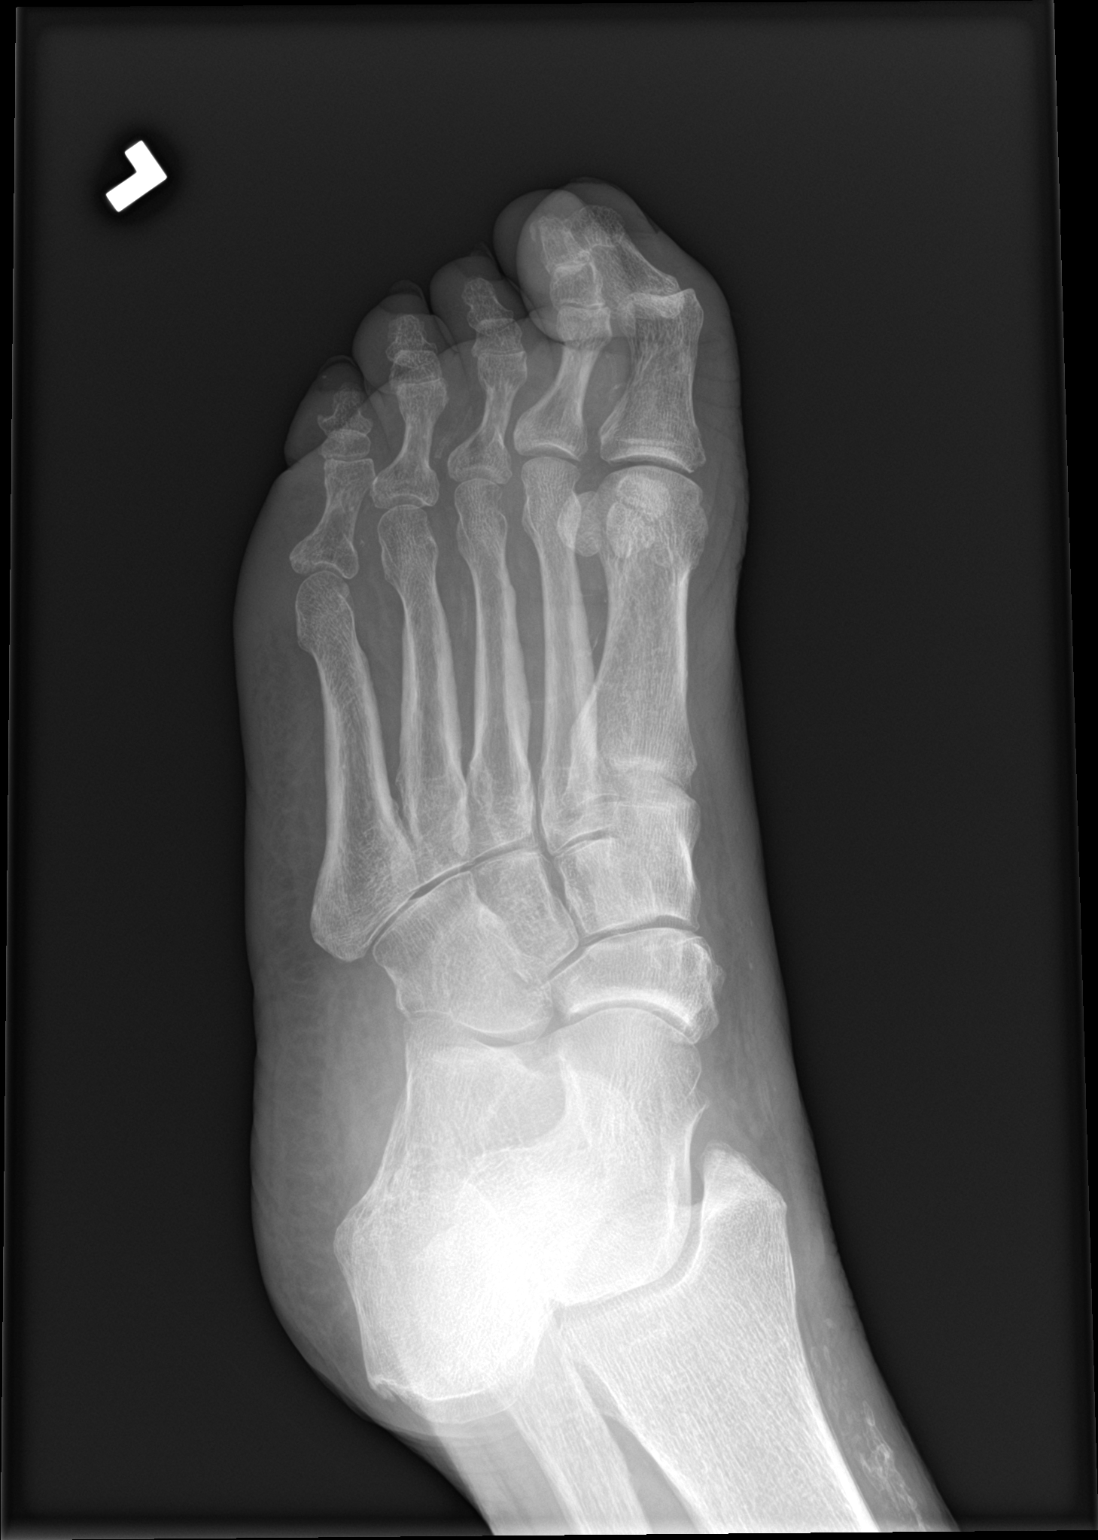

[foot lat]
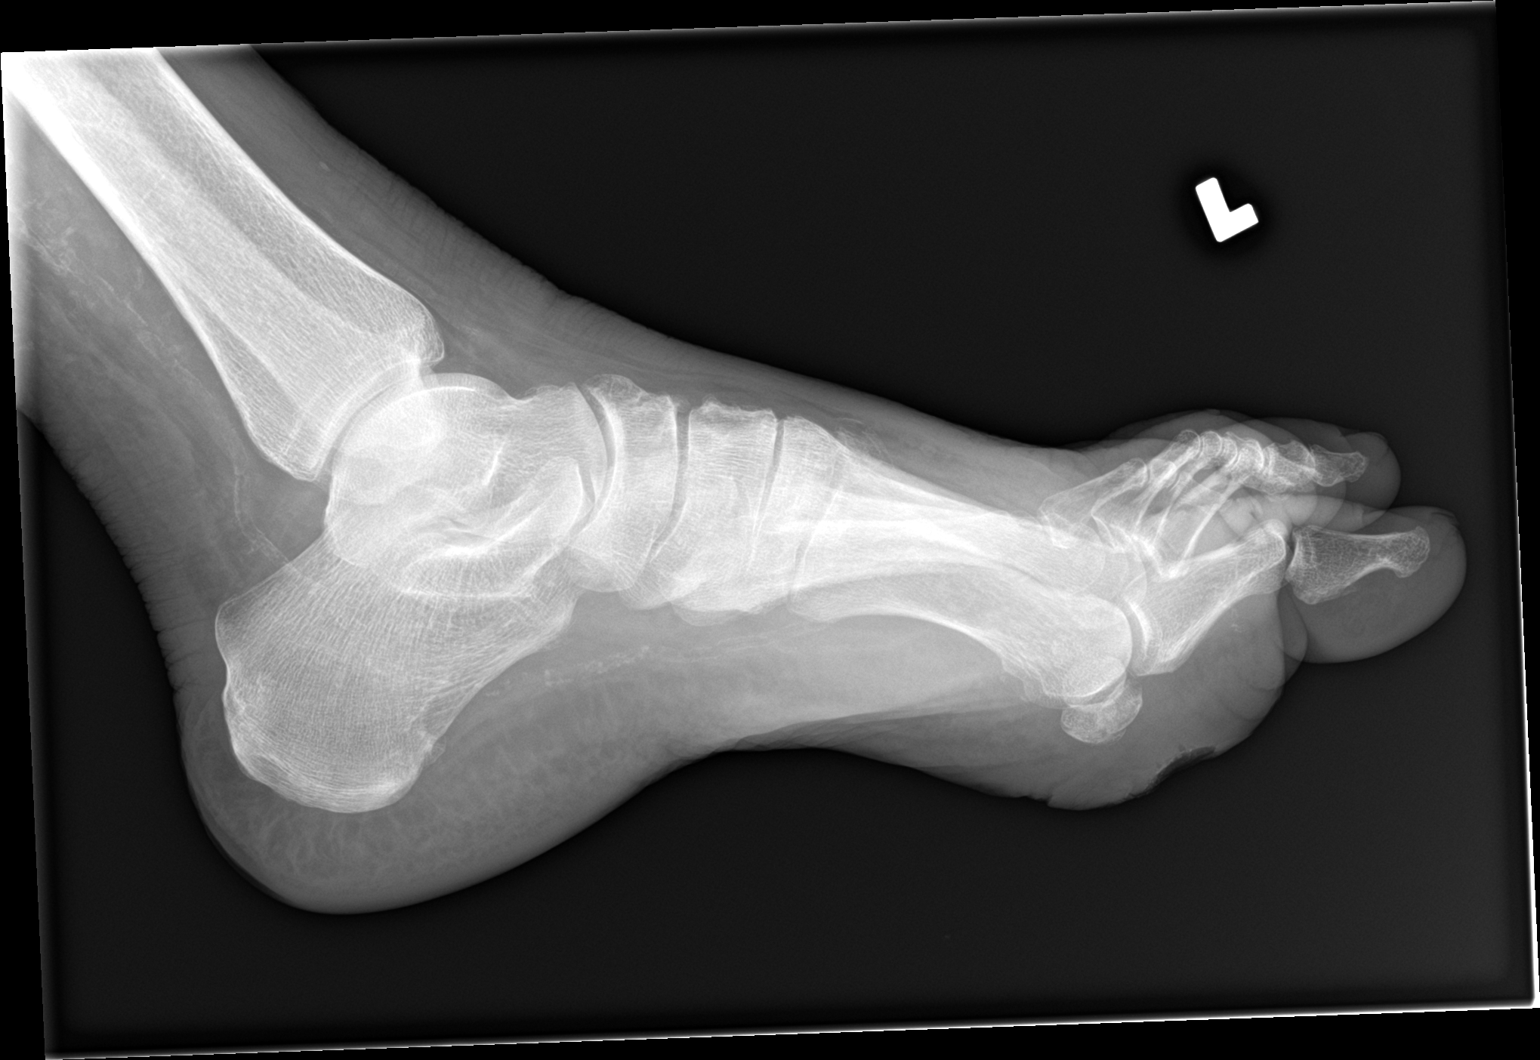

[3 of 3 positions shown; findings below may reference images not displayed]

FINDINGS: There is no evidence of fracture or dislocation. There is no
evidence of arthropathy or other focal bone abnormality. A 1.2 cm
superficial soft tissue ulceration is seen along the plantar aspect
of the distal left foot. Moderate severity soft tissue swelling is
also seen within this region.
IMPRESSION: Plantar soft tissue ulceration and associated soft tissue swelling,
as described above, without an acute osseous abnormality.

## 2021-11-21 IMAGING — DX DG FOOT COMPLETE 3+V*R*
3 series · 3 of 3 positions shown · non-contrast
Comparison: [DATE]

CLINICAL DATA: Wounds to both feet x2 weeks.

EXAM:
RIGHT FOOT COMPLETE - 3+ VIEW

[foot ap]
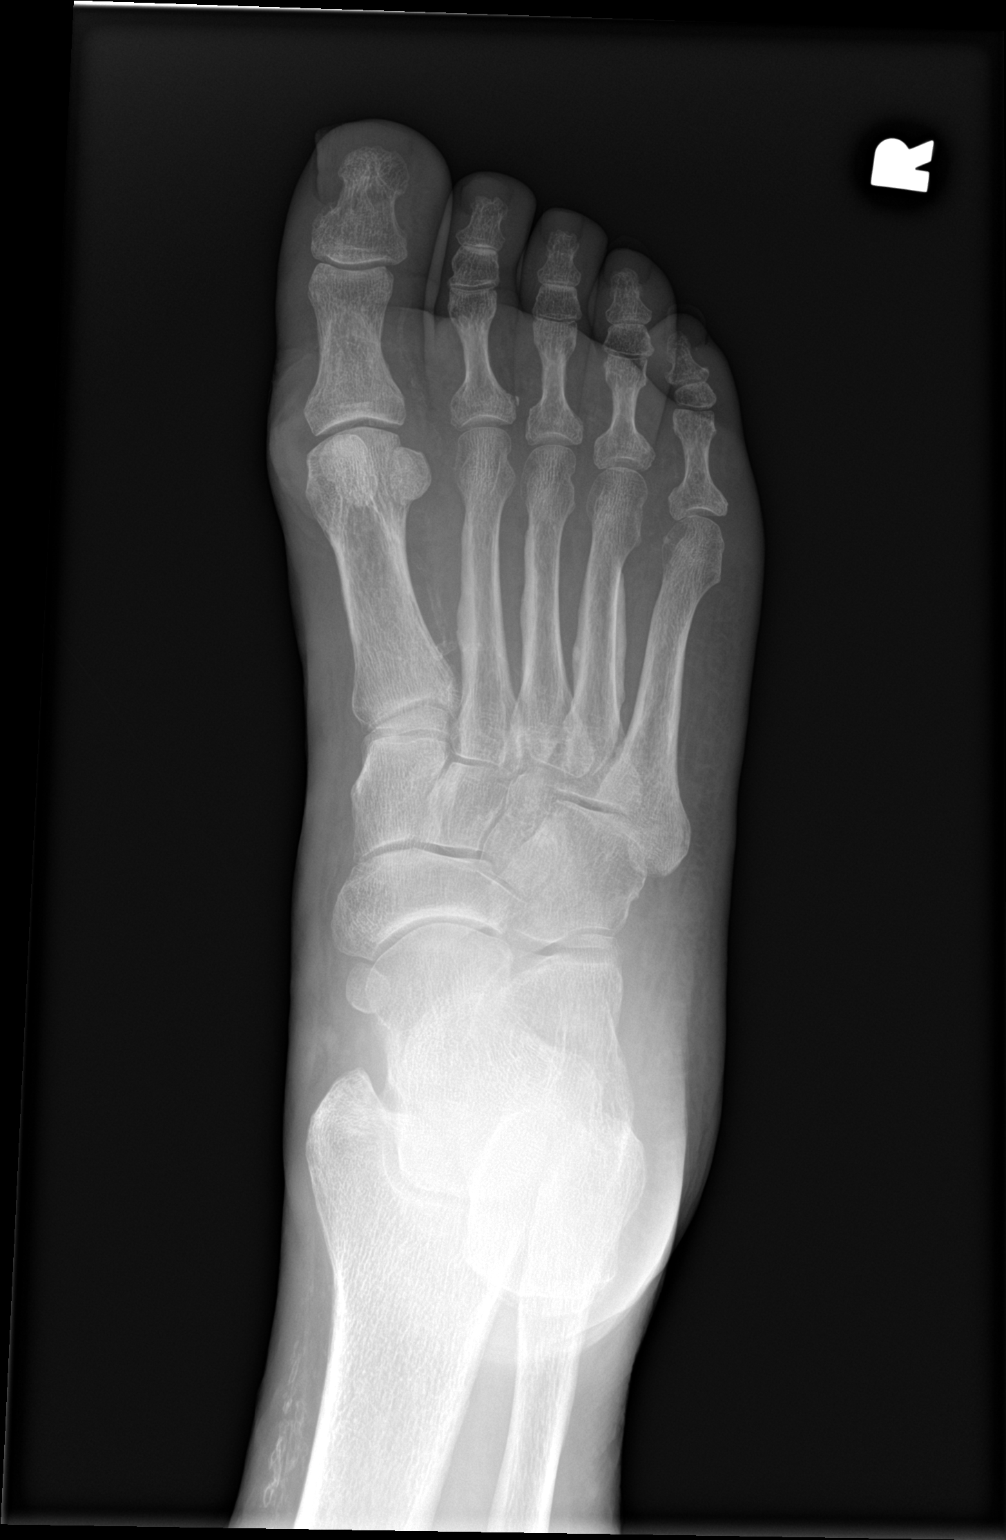

[foot obl]
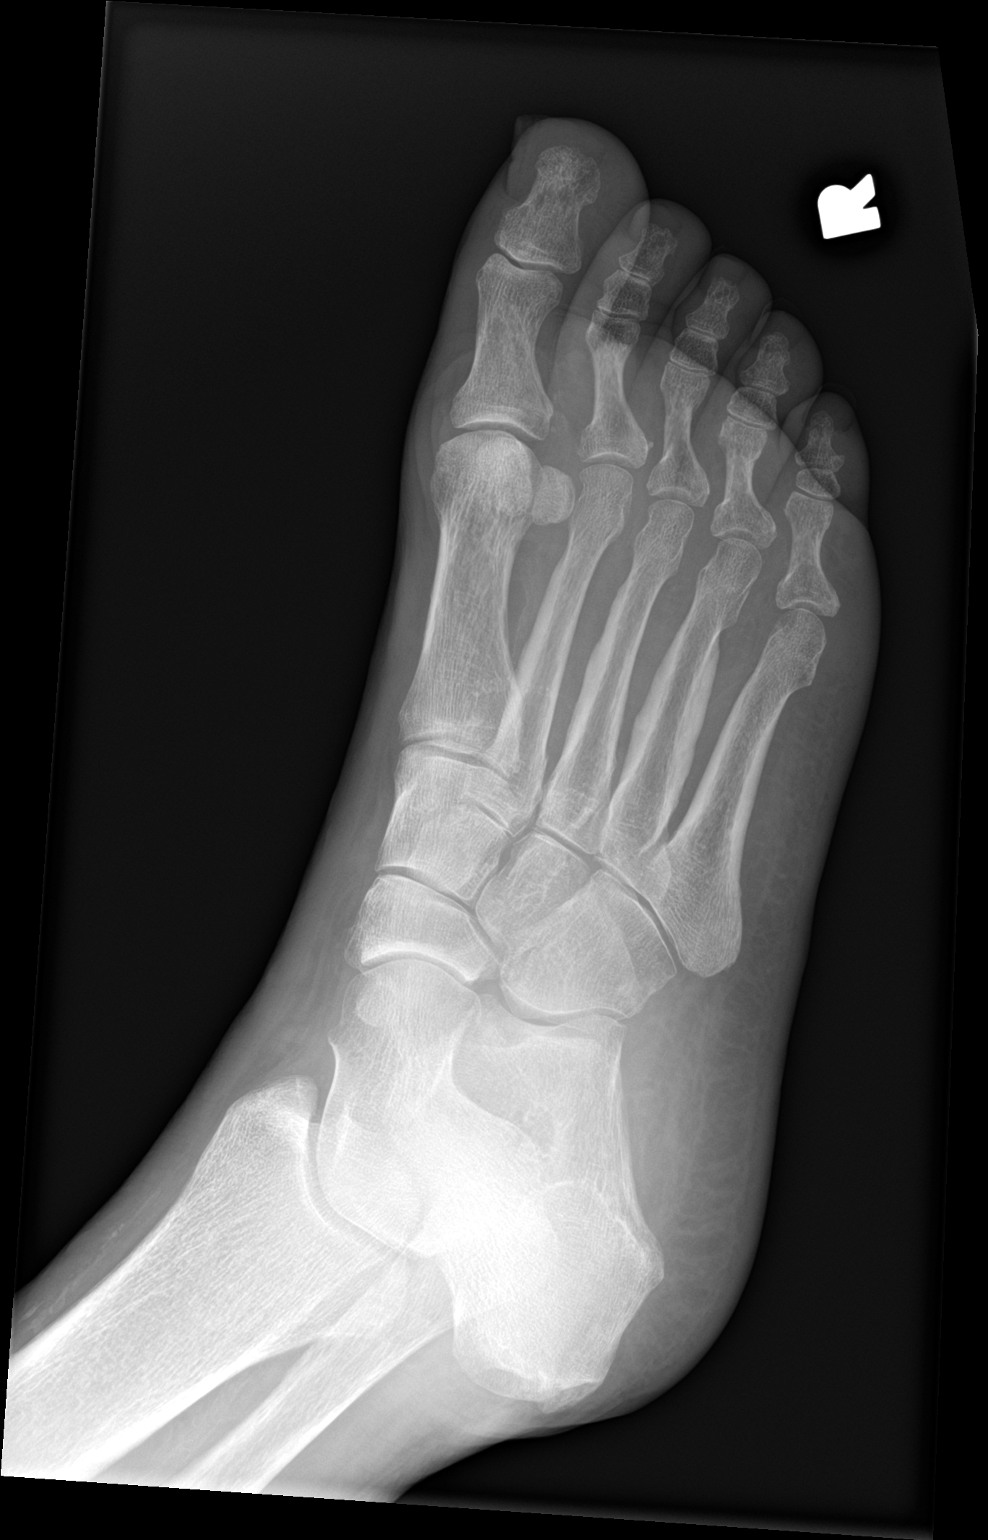

[foot lat]
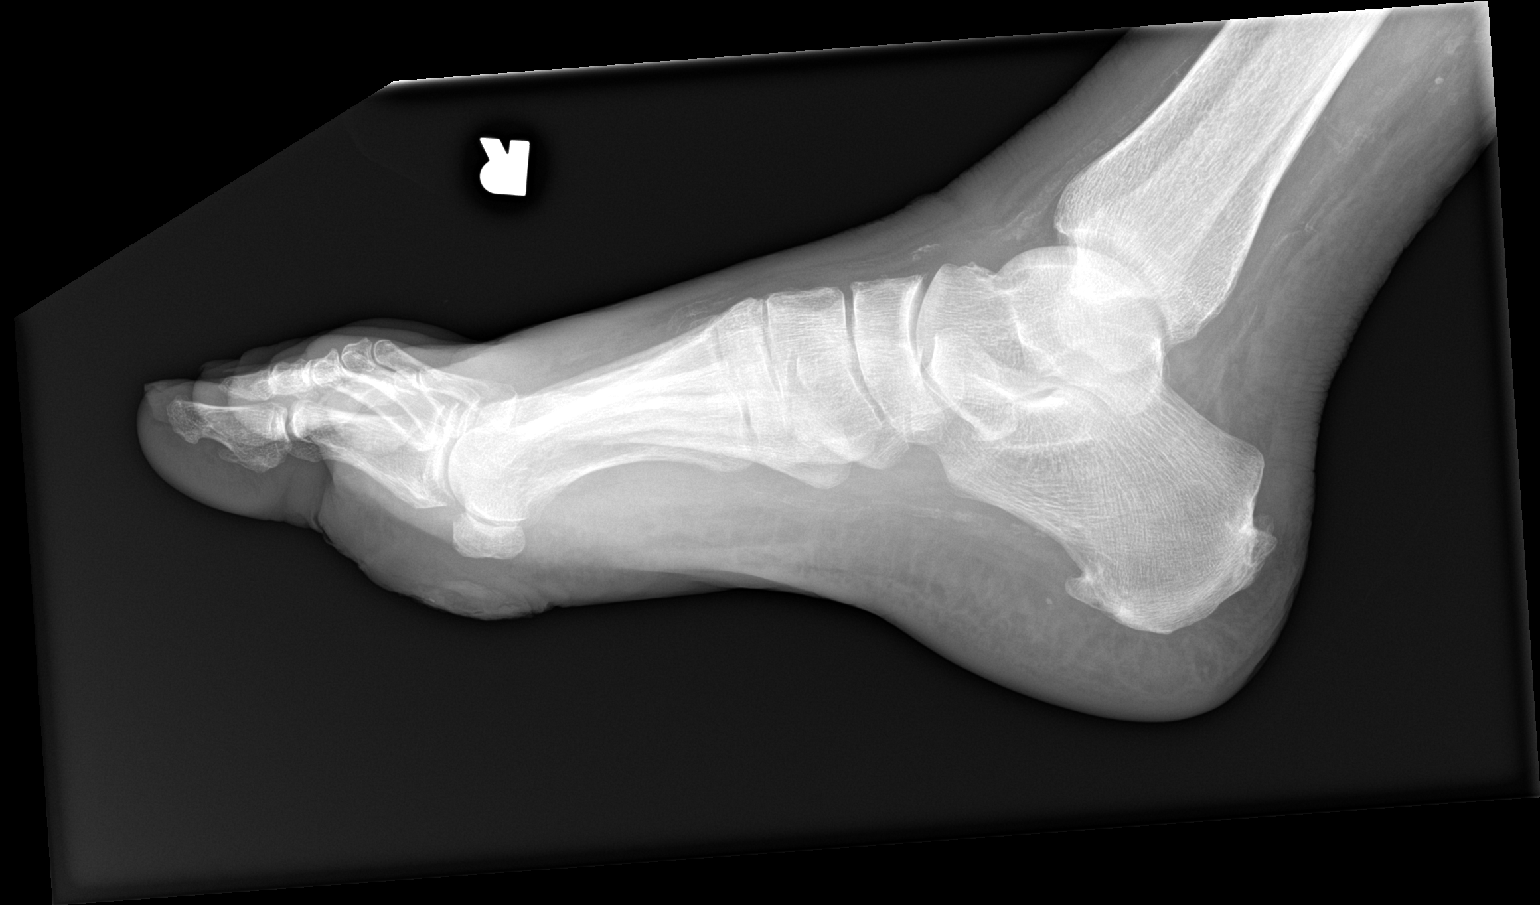

[3 of 3 positions shown; findings below may reference images not displayed]

FINDINGS: There is no evidence of fracture or dislocation. There is no
evidence of arthropathy or other focal bone abnormality. Mild to
moderate severity vascular calcification is seen. Mild soft tissue
swelling is seen along the plantar aspect of the distal right foot.
IMPRESSION: Plantar soft tissue swelling, as described above, without an acute
osseous abnormality.

## 2021-11-21 MED ORDER — NYSTATIN 100000 UNIT/GM EX CREA
TOPICAL_CREAM | Freq: Two times a day (BID) | CUTANEOUS | Status: DC
  Filled 2021-11-21 (×2): qty 15

## 2021-11-21 MED ORDER — VANCOMYCIN HCL 2000 MG/400ML IV SOLN
2000.0000 mg | Freq: Once | INTRAVENOUS | Status: AC
  Administered 2021-11-21: 2000 mg via INTRAVENOUS
  Filled 2021-11-21: qty 400

## 2021-11-21 MED ORDER — MORPHINE SULFATE (PF) 4 MG/ML IV SOLN
4.0000 mg | Freq: Once | INTRAVENOUS | Status: AC
  Administered 2021-11-21: 4 mg via INTRAVENOUS
  Filled 2021-11-21: qty 1

## 2021-11-21 MED ORDER — SODIUM CHLORIDE 0.9 % IV BOLUS
1000.0000 mL | Freq: Once | INTRAVENOUS | Status: AC
  Administered 2021-11-21: 1000 mL via INTRAVENOUS

## 2021-11-21 MED ORDER — VANCOMYCIN HCL 1250 MG/250ML IV SOLN
1250.0000 mg | INTRAVENOUS | Status: DC
  Administered 2021-11-22 – 2021-11-24 (×3): 1250 mg via INTRAVENOUS
  Filled 2021-11-21 (×3): qty 250

## 2021-11-21 MED ORDER — METOCLOPRAMIDE HCL 5 MG/ML IJ SOLN
10.0000 mg | Freq: Once | INTRAMUSCULAR | Status: AC
  Administered 2021-11-21: 10 mg via INTRAVENOUS
  Filled 2021-11-21: qty 2

## 2021-11-21 NOTE — ED Provider Notes (Signed)
[facility] Provider Note   CSN: [id number] Arrival date & time: 11/21/21  1545     History Chief Complaint  Patient presents with   Weakness    [name] is a 60 y.o. female.  With a history of COPD, type 1 diabetes, hypertension, diastolic dysfunction, sleep apnea presenting for evaluation of generalized weakness along with nausea and vomiting x2 days along with increased urinary frequency, but states this is a chronic condition.  She endorses having chronic weakness with lightheadedness for the past month but has become worse since she has been unable to take any p.o.'s x2 days.  She does have some mild diarrhea as well, last BM was yesterday evening.  She was seen here on 12/2 at which time she had hypokalemia and was treated for right leg cellulitis, has completed the doxycycline prescribed.  She denies her symptoms have worsened but the doxycycline has also not improved her symptoms.  She states her PCP has arranged for her to see Dr. Irving Shows a local podiatrist next week.  She denies fevers or chills, chest pain or sob.  She endorses pain that radiates across her upper abdomen, left to right with attempts at food intake.  She has found no alleviators for her symptoms. Per nursing note, family has noticed mild confusion and pt endorses difficulty with word recall for the past several days.      The history is provided by the patient.      Past Medical History:  Diagnosis Date   Asthma    COPD (chronic obstructive pulmonary disease) ([place])    Depression    Diabetes mellitus    Diastolic dysfunction 08/13/9210   Hypertension    Prolonged QT interval 05/14/2015    Patient Active Problem List   Diagnosis Date Noted   Morbid obesity ([place]) 01/29/2020   Educated about COVID-19 virus infection 01/29/2020   OSA (obstructive sleep apnea) 12/21/2019   Asthma 94/17/4081   Diastolic dysfunction 44/81/8563   Prolonged QT interval 05/14/2015   Palpitations  07/23/2014   Generalized weakness 07/23/2014   Diabetes mellitus ([place]) 07/22/2014   Hypertension 07/22/2014   Depression 07/22/2014    Past Surgical History:  Procedure Laterality Date   CATARACT EXTRACTION     CESAREAN SECTION     CHOLECYSTECTOMY       OB History     Gravida  3   Para  2   Term  2   Preterm      AB  1   Living  2      SAB  1   IAB      Ectopic      Multiple      Live Births              Family History  Problem Relation Age of Onset   Stroke Mother    Diabetes Father    Heart failure Father    Hypertension Father    Diabetes Sister    Heart failure Sister    Hypertension Sister    Stroke Sister    Cancer Other    Stroke Sister     Social History   Tobacco Use   Smoking status: Passive Smoke Exposure - Never Smoker   Smokeless tobacco: Never  Vaping Use   Vaping Use: Never used  Substance Use Topics   Alcohol use: No    Alcohol/week: 0.0 standard drinks   Drug use: No    Home Medications Prior to  Admission medications   Medication Sig Start Date End Date Taking? Authorizing Provider  albuterol (VENTOLIN HFA) 108 (90 Base) MCG/ACT inhaler Inhale 2 puffs into the lungs every 6 (six) hours as needed for wheezing or shortness of breath.    [provider]  atorvastatin (LIPITOR) 10 MG tablet Take 10 mg by mouth at bedtime. 09/29/21   [provider]  bethanechol (URECHOLINE) 25 MG tablet Take 25 mg by mouth every morning.  09/10/19   [provider]  cephALEXin (KEFLEX) 500 MG capsule Take 1 capsule (500 mg total) by mouth 4 (four) times daily. 11/06/21   Daleen Bo, MD  doxycycline (VIBRAMYCIN) 100 MG capsule Take 1 capsule (100 mg total) by mouth 2 (two) times daily. One po bid x 7 days 11/06/21   Daleen Bo, MD  esomeprazole (NEXIUM) 20 MG capsule Take 20 mg by mouth 2 (two) times daily before a meal.    [provider]  famotidine (PEPCID) 20 MG tablet Take 20 mg by mouth 2 (two)  times daily as needed. 07/24/21   [provider]  gabapentin (NEURONTIN) 100 MG capsule Take 100 mg by mouth 2 (two) times daily.    [provider]  hydrOXYzine (ATARAX/VISTARIL) 10 MG tablet Take 10 mg by mouth every 8 (eight) hours as needed for itching or anxiety.  11/22/19   [provider]  insulin aspart protamine- aspart (NOVOLOG MIX 70/30) (70-30) 100 UNIT/ML injection Inject 60-80 Units into the skin See admin instructions. 80 units in the morning and 60 units in the evening    [provider]  Magnesium Oxide 200 MG TABS Take 1 tablet (200 mg total) by mouth 2 (two) times daily. 11/06/21   Daleen Bo, MD  potassium chloride SA (K-DUR) 20 MEQ tablet Take 20 mEq by mouth 2 (two) times daily.    [provider]  promethazine (PHENERGAN) 25 MG tablet Take 25 mg by mouth every 6 (six) hours as needed. 08/05/21   [provider]  propranolol ER (INDERAL LA) 80 MG 24 hr capsule Take 80 mg by mouth daily. 12/28/19   [provider]  sertraline (ZOLOFT) 100 MG tablet Take 100 mg by mouth daily. 12/19/19   [provider]  sertraline (ZOLOFT) 50 MG tablet Take 50 mg by mouth daily. Take 100 mg tablet to make a total of 150 mg daily 09/29/21   [provider]  torsemide (DEMADEX) 20 MG tablet Take 20 mg by mouth 2 (two) times daily.  03/28/18   [provider]  traZODone (DESYREL) 50 MG tablet Take 50 mg by mouth at bedtime.    [provider]    Allergies    Carvedilol, Benicar [olmesartan], Codeine, Sulfa antibiotics, and Trulicity [dulaglutide]  Review of Systems   Review of Systems  Constitutional:  Negative for chills and fever.  HENT:  Negative for congestion and sore throat.   Eyes: Negative.   Respiratory:  Negative for chest tightness and shortness of breath.   Cardiovascular:  Negative for chest pain and palpitations.  Gastrointestinal:  Positive for abdominal pain, diarrhea, nausea and  vomiting.  Genitourinary: Negative.  Negative for dysuria.  Musculoskeletal:  Negative for arthralgias, joint swelling and neck pain.  Skin:  Positive for wound. Negative for rash.  Neurological:  Negative for dizziness, weakness, light-headedness, numbness and headaches.  Psychiatric/Behavioral: Negative.    All other systems reviewed and are negative.  Physical Exam Updated Vital Signs BP (!) 132/53    Pulse 74  Temp 97.9 F (36.6 C)    Resp 18    Ht 5\' 2"  (1.575 m)    Wt 136.1 kg    LMP 08/06/2012    SpO2 98%    BMI 54.87 kg/m   Physical Exam Vitals and nursing note reviewed.  Constitutional:      Appearance: She is well-developed. She is obese.  HENT:     Head: Normocephalic and atraumatic.     Mouth/Throat:     Pharynx: Oropharynx is clear.  Eyes:     Extraocular Movements: Extraocular movements intact.     Conjunctiva/sclera: Conjunctivae normal.     Pupils: Pupils are equal, round, and reactive to light.  Cardiovascular:     Rate and Rhythm: Normal rate and regular rhythm.     Heart sounds: Normal heart sounds.  Pulmonary:     Effort: Pulmonary effort is normal.     Breath sounds: Normal breath sounds. No wheezing.  Abdominal:     General: Abdomen is protuberant. Bowel sounds are normal.     Palpations: Abdomen is soft.     Tenderness: There is abdominal tenderness in the right upper quadrant, epigastric area and left upper quadrant. There is no guarding.  Musculoskeletal:        General: Normal range of motion.     Cervical back: Normal range of motion.  Skin:    General: Skin is warm and dry.     Comments: Eschar/scabbing noted bilateral feet 1st metatarsals with scaly and thickened skin surrounding, some blanching surrounding erythema.  2 additional round areas of eschar right lateral upper calf with a small diameter of surrounding blanching erythema.  The above wounds are dry.  She has an additional wound left lateral mid calf which is superficial, clear fluid  discharge, again mild surrounding erythema.  There is no red streaking at either of these sites.  Eschar areas are nickel sized areas.  Neurological:     General: No focal deficit present.     Mental Status: She is alert and oriented to person, place, and time.    ED Results / Procedures / Treatments   Labs (all labs ordered are listed, but only abnormal results are displayed) Labs Reviewed  CBC WITH DIFFERENTIAL/PLATELET - Abnormal; Notable for the following components:      Result Value   WBC 18.8 (*)    RBC 5.60 (*)    MCH 25.9 (*)    Platelets 458 (*)    Neutro Abs 16.7 (*)    Monocytes Absolute 1.1 (*)    Abs Immature Granulocytes 0.14 (*)    All other components within normal limits  COMPREHENSIVE METABOLIC PANEL - Abnormal; Notable for the following components:   Chloride 97 (*)    Glucose, Bld 254 (*)    Creatinine, Ser 1.27 (*)    Albumin 2.6 (*)    AST 177 (*)    ALT 55 (*)    GFR, Estimated 48 (*)    All other components within normal limits  CBG MONITORING, ED - Abnormal; Notable for the following components:   Glucose-Capillary 250 (*)    All other components within normal limits  LIPASE, BLOOD  MAGNESIUM  URINALYSIS, ROUTINE W REFLEX MICROSCOPIC    EKG EKG Interpretation  Date/Time:  Saturday November 21 2021 15:51:19 EST Ventricular Rate:  74 PR Interval:  136 QRS Duration: 112 QT Interval:  472 QTC Calculation: 524 R Axis:   -64 Text Interpretation: Sinus rhythm Left anterior fascicular block Low voltage,  precordial leads Consider anterior infarct Minimal ST depression, lateral leads Prolonged QT interval Since last tracing QT has lengthened Otherwise no significant change Confirmed by Daleen Bo [phone]) on 11/21/2021 4:13:17 PM  Radiology No results found.  Procedures Procedures   Medications Ordered in ED Medications  sodium chloride 0.9 % bolus 1,000 mL (has no administration in time range)  metoCLOPramide (REGLAN) injection 10 mg (has  no administration in time range)  morphine 4 MG/ML injection 4 mg (has no administration in time range)    ED Course  I have reviewed the triage vital signs and the nursing notes.  Pertinent labs & imaging results that were available during my care of the patient were reviewed by me and considered in my medical decision making (see chart for details).    MDM Rules/Calculators/A&P                         Patient with a significant leukocytosis, she also has elevated liver enzymes, her lipase is normal.  She has had a cholecystectomy.  She does have abdominal pain but it originates in the left upper quadrant and radiates to the right.  She denies Tylenol use, EtOH use.  CT imaging has been ordered to further evaluate her elevated enzymes.  An acute hepatitis panel has also been ordered.  We are still pending a urinalysis, pure wick is in place.  IV fluids pain and nausea medicine have been ordered.  She also has some renal insufficiency with a creatinine of 1.27 today.  Her glucose is 254, there is no gap, she has a normal CO2.  X-rays have also been ordered of her bilateral feet to help exclude obvious osteomyelitis.  I suspect patient will require admission for IV antibiotics for failed outpatient treatment of her cellulitis.  Antibiotic not ordered currently, pending UA results.  Patient states she does have frequent problems with UTIs.  Patient was discussed with Will Ileene Patrick, PA-C who assumes care.         Final Clinical Impression(s) / ED Diagnoses Final diagnoses:  None    Rx / DC Orders ED Discharge Orders     None        Landis Martins 11/21/21 2047    Daleen Bo, MD 11/22/21 2200

## 2021-11-21 NOTE — ED Triage Notes (Signed)
Reports generalized weakness with vomiting x 2 today.  Reports weakness and dizziness for the past month worse the past week.  Resp even and unlabored.  Skin warm and dry.  Pt has wounds to bilateral legs.  Per family pt has been having some mild confusion.  Reports pain to bilateral legs and R arm.  No sob noted.

## 2021-11-21 NOTE — ED Provider Notes (Signed)
Received at shift change from Teresa Jefferson, PA-C please see her note for full detail  Patient with medical history including COPD, type 1 diabetes, hypertension, diastolic dysfunction and sleep apnea presents with multiple complaints, she is having some nausea vomiting which is chronic for her as well as right-sided abdominal pain.  She also notes that she is having worsening pain in her feet bilaterally she is concerned for worsening cellulitis was on doxycycline for 10 days received into her antibiotics.  Per previous provider follow-up on CT scan, x-rays, and likely admission for diabetic foot ulcers. Physical Exam  BP 106/88    Pulse 81    Temp 97.9 F (36.6 C)    Resp 16    Ht 5\' 2"  (1.575 m)    Wt 136.1 kg    LMP 08/06/2012    SpO2 95%    BMI 54.87 kg/m   Physical Exam Vitals and nursing note reviewed.  Constitutional:      General: She is not in acute distress.    Appearance: Normal appearance. She is not ill-appearing or diaphoretic.  HENT:     Head: Normocephalic and atraumatic.     Nose: No congestion or rhinorrhea.  Eyes:     General: No scleral icterus.       Right eye: No discharge.        Left eye: No discharge.     Conjunctiva/sclera: Conjunctivae normal.  Pulmonary:     Effort: Pulmonary effort is normal. No respiratory distress.     Breath sounds: Normal breath sounds. No wheezing.  Abdominal:     Comments: Abdomen was visualized she appears to have chronic skin changes on her abdomen with no active infection present, beneath her pannus she appears to have a macular like rash consistent with yeast infection.  Abdomen was nondistended to percussion nontender to palpation.  Musculoskeletal:     Cervical back: Neck supple.     Right lower leg: No edema.     Left lower leg: No edema.  Skin:    General: Skin is warm and dry.     Coloration: Skin is not jaundiced or pale.     Comments: Patient is two minimal lesions on the dorsum aspect of her feet bilaterally at the MTP  joints appears to be unstageable ulcer at this time due to the eschar there is surrounding erythema please see picture for full detail  Neurological:     Mental Status: She is alert and oriented to person, place, and time.  Psychiatric:        Mood and Affect: Mood normal.           ED Course/Procedures     Procedures  MDM  Initial impression-presents with weakness is worsening cellulitis.  She is alert, no acute stress, vital signs reassuring.  Work-up-CBC shows leukocytosis of 18.8, CMP shows chloride of 97, glucose 254, creatinine 1.27 liver enzymes elevated AST of 177 ALT 55 GFR 48 magnesium 2.0, lipase 18, UA negative for nitrates leukocytes or hematuria.  DG of foot left and right both negative for signs of osteomyelitis.  CT scan reveals skin thickening left anterior abdominal wall, wall thickening of the distal esophagus concerning for esophagitis,  Reassessment-patient was reassessed recommend admission for failed outpatient therapy for diabetic foot, patient agreed in this plan.  Will obtain blood cultures, lactic, start her on antibiotics.  We will also consult hospitalist for admission.  Consult-spoke with Dr. Josephine Cables who admit the patient.  Plan-admit patient for diabetic foot  ulcer requiring IV antibiotics.         Marcello Fennel, PA-C 11/21/21 2332    Daleen Bo, MD 11/22/21 2200

## 2021-11-21 NOTE — ED Notes (Signed)
Patient transported to CT 

## 2021-11-21 NOTE — H&P (Signed)
History and Physical  [name]:[id number] DOB: 04-25-1961 DOA: 11/21/2021  Referring physician: Marcello Fennel, PA-C  PCP: Bridget Hartshorn, NP  Patient coming from: Home  Chief Complaint: Generalized weakness  HPI: [name] is a 60 y.o. female with medical history significant for COPD, hypertension, diastolic CHF, GERD, hyperlipidemia, T2DM who presents to the emergency department due to bilateral worsening pain in her feet bilaterally.  Patient presented to the ED on 12/2, she was noted to have hypokalemia and right leg cellulitis, she was prescribed for 10-day prescription of doxycycline, patient states that she was compliant and finished the antibiotic regimen, but endorsed having diarrhea for about 3 to 4 days after completing the antibiotic which lasts BM being yesterday.  She states that she had a fall last night at home without losing consciousness and was unable to get up, husband helped in getting off the floor, she complained of generalized weakness and ongoing pain in feet bilaterally, so she decided to go to the ED for further evaluation and management.  She endorsed nausea, but denies vomiting, chest pain, shortness of breath, headache, numbness, tingling, extremity weakness, burning sensation on urination or increased frequency in urination  ED Course:  In the emergency department, she was hemodynamically stable.  Work-up in the ED showed leukocytosis, thrombocytosis, hyperglycemia, urinalysis was positive for hematuria and proteinuria, lactic acid 2.0, magnesium 2.0, lipase 18.  Influenza A, B, SARS coronavirus 2 was negative. Left foot x-ray showed plantar soft tissue ulceration and associated soft tissue swelling, as described above, without an acute osseous abnormality. Right foot x-ray showed plantar soft tissue swelling, as described above, without an acute osseous abnormality. CT abdomen and pelvis without contrast showed skin thickening in the  left anterior abdominal wall Patient was treated with morphine, Reglan, IV hydration was provided and patient was started on IV vancomycin.  Hospitalist was asked to admit patient for further evaluation and management.   Review of Systems: Review of systems as noted in the HPI. All other systems reviewed and are negative.   Past Medical History:  Diagnosis Date   Asthma    COPD (chronic obstructive pulmonary disease) ([facility])    Depression    Diabetes mellitus    Diastolic dysfunction 06/09/9162   Hypertension    Prolonged QT interval 05/14/2015   Past Surgical History:  Procedure Laterality Date   CATARACT EXTRACTION     CESAREAN SECTION     CHOLECYSTECTOMY      Social History:  reports that she is a non-smoker but has been exposed to tobacco smoke. She has never used smokeless tobacco. She reports that she does not drink alcohol and does not use drugs.   Allergies  Allergen Reactions   Carvedilol Palpitations   Benicar [Olmesartan] Swelling   Codeine Other (See Comments)    Confusion    Sulfa Antibiotics Swelling    Whole face swells   Trulicity [Dulaglutide] Diarrhea    Family History  Problem Relation Age of Onset   Stroke Mother    Diabetes Father    Heart failure Father    Hypertension Father    Diabetes Sister    Heart failure Sister    Hypertension Sister    Stroke Sister    Cancer Other    Stroke Sister      Prior to Admission medications   Medication Sig Start Date End Date Taking? Authorizing Provider  albuterol (VENTOLIN HFA) 108 (90 Base) MCG/ACT inhaler Inhale 2 puffs into the lungs  every 6 (six) hours as needed for wheezing or shortness of breath.    [provider]  atorvastatin (LIPITOR) 10 MG tablet Take 10 mg by mouth at bedtime. 09/29/21   [provider]  bethanechol (URECHOLINE) 25 MG tablet Take 25 mg by mouth every morning.  09/10/19   [provider]  cephALEXin (KEFLEX) 500 MG capsule Take 1 capsule (500 mg total)  by mouth 4 (four) times daily. 11/06/21   Daleen Bo, MD  doxycycline (VIBRAMYCIN) 100 MG capsule Take 1 capsule (100 mg total) by mouth 2 (two) times daily. One po bid x 7 days 11/06/21   Daleen Bo, MD  esomeprazole (NEXIUM) 20 MG capsule Take 20 mg by mouth 2 (two) times daily before a meal.    [provider]  famotidine (PEPCID) 20 MG tablet Take 20 mg by mouth 2 (two) times daily as needed. 07/24/21   [provider]  gabapentin (NEURONTIN) 100 MG capsule Take 100 mg by mouth 2 (two) times daily.    [provider]  hydrOXYzine (ATARAX/VISTARIL) 10 MG tablet Take 10 mg by mouth every 8 (eight) hours as needed for itching or anxiety.  11/22/19   [provider]  insulin aspart protamine- aspart (NOVOLOG MIX 70/30) (70-30) 100 UNIT/ML injection Inject 60-80 Units into the skin See admin instructions. 80 units in the morning and 60 units in the evening    [provider]  Magnesium Oxide 200 MG TABS Take 1 tablet (200 mg total) by mouth 2 (two) times daily. 11/06/21   Daleen Bo, MD  potassium chloride SA (K-DUR) 20 MEQ tablet Take 20 mEq by mouth 2 (two) times daily.    [provider]  promethazine (PHENERGAN) 25 MG tablet Take 25 mg by mouth every 6 (six) hours as needed. 08/05/21   [provider]  propranolol ER (INDERAL LA) 80 MG 24 hr capsule Take 80 mg by mouth daily. 12/28/19   [provider]  sertraline (ZOLOFT) 100 MG tablet Take 100 mg by mouth daily. 12/19/19   [provider]  sertraline (ZOLOFT) 50 MG tablet Take 50 mg by mouth daily. Take 100 mg tablet to make a total of 150 mg daily 09/29/21   [provider]  torsemide (DEMADEX) 20 MG tablet Take 20 mg by mouth 2 (two) times daily.  03/28/18   [provider]  traZODone (DESYREL) 50 MG tablet Take 50 mg by mouth at bedtime.    [provider]    Physical Exam: BP 106/88    Pulse 81    Temp 97.9 F (36.6 C)    Resp  16    Ht 5\' 2"  (1.575 m)    Wt 136.1 kg    LMP 08/06/2012    SpO2 95%    BMI 54.87 kg/m   General: 60 y.o. year-old female well developed well nourished in no acute distress.  Alert and oriented x3. HEENT: Dry mucous membrane.  NCAT, EOMI Neck: Supple, trachea medial Cardiovascular: Regular rate and rhythm with no rubs or gallops.  No thyromegaly or JVD noted.  No lower extremity edema. 2/4 pulses in all 4 extremities. Respiratory: Clear to auscultation with no wheezes or rales. Good inspiratory effort. Abdomen: Soft, nontender nondistended with normal bowel sounds x4 quadrants. Muskuloskeletal: Left plantar feet with dry ulcerative wound with bilateral eschar with scaly skin of both great toes and with surrounding erythema, slightly warm to touch.  Lower extremities with superficial wounds and bilateral chronic venous stasis.  Neuro: CN II-XII intact, strength 5/5 x 4, sensation, reflexes intact Skin: No ulcerative lesions noted or rashes Psychiatry: Mood is appropriate for condition and setting          Labs on Admission:  Basic Metabolic Panel: Recent Labs  Lab 11/21/21 1634  NA 141  K 4.0  CL 97*  CO2 32  GLUCOSE 254*  BUN 18  CREATININE 1.27*  CALCIUM 9.0  MG 2.0   Liver Function Tests: Recent Labs  Lab 11/21/21 1634  AST 177*  ALT 55*  ALKPHOS 118  BILITOT 1.1  PROT 7.0  ALBUMIN 2.6*   Recent Labs  Lab 11/21/21 1634  LIPASE 18   No results for input(s): AMMONIA in the last 168 hours. CBC: Recent Labs  Lab 11/21/21 1634  WBC 18.8*  NEUTROABS 16.7*  HGB 14.5  HCT 45.3  MCV 80.9  PLT 458*   Cardiac Enzymes: No results for input(s): CKTOTAL, CKMB, CKMBINDEX, TROPONINI in the last 168 hours.  BNP (last 3 results) No results for input(s): BNP in the last 8760 hours.  ProBNP (last 3 results) No results for input(s): PROBNP in the last 8760 hours.  CBG: Recent Labs  Lab 11/21/21 1653  GLUCAP 250*    Radiological Exams on Admission: CT ABDOMEN  PELVIS WO CONTRAST  Result Date: 11/21/2021 CLINICAL DATA:  Left lower quadrant abdominal pain. EXAM: CT ABDOMEN AND PELVIS WITHOUT CONTRAST TECHNIQUE: Multidetector CT imaging of the abdomen and pelvis was performed following the standard protocol without IV contrast. COMPARISON:  CT abdomen and pelvis 09/24/2017. FINDINGS: Lower chest: No acute abnormality. Hepatobiliary: No focal liver abnormality is seen. Status post cholecystectomy. Pancreas: Atrophic. Spleen: Mildly enlarged, unchanged. Adrenals/Urinary Tract: There is a 16 mm cyst in the right kidney. Otherwise, the adrenal glands, kidneys and bladder are within normal limits. Stomach/Bowel: Stomach is within normal limits. There is distal esophageal wall thickening. Appendix appears normal. No evidence of bowel wall thickening, distention, or inflammatory changes. Vascular/Lymphatic: Aortic atherosclerosis. No enlarged abdominal or pelvic lymph nodes. Reproductive: Uterus and bilateral adnexa are unremarkable. Other: There is no ascites or focal abdominal wall hernia. There is mild body wall edema. There is skin thickening measuring up to 16 mm in the left anterior abdominal wall. No focal fluid collection or soft tissue gas. Musculoskeletal: Multilevel degenerative changes affect the spine IMPRESSION: 1. Skin thickening in the left anterior abdominal wall. Correlate clinically for infection. No evidence for abscess or soft tissue gas. 2. Mild splenomegaly. 3. Wall thickening of the distal esophagus concerning for esophagitis. Recommend clinical correlation follow-up to exclude underlying lesion. 4.  Aortic Atherosclerosis (ICD10-I70.0). Electronically Signed   By: Ronney Asters M.D.   On: 11/21/2021 21:18   DG Foot Complete Left  Result Date: 11/21/2021 CLINICAL DATA:  Wounds to both feet x2 weeks. EXAM: LEFT FOOT - COMPLETE 3+ VIEW COMPARISON:  November 06, 2021 FINDINGS: There is no evidence of fracture or dislocation. There is no evidence of  arthropathy or other focal bone abnormality. A 1.2 cm superficial soft tissue ulceration is seen along the plantar aspect of the distal left foot. Moderate severity soft tissue swelling is also seen within this region. IMPRESSION: Plantar soft tissue ulceration and associated soft tissue swelling, as described above, without an acute osseous abnormality. Electronically Signed   By: Virgina Norfolk M.D.   On: 11/21/2021 21:33   DG Foot Complete Right  Result Date: 11/21/2021 CLINICAL DATA:  Wounds to both feet x2 weeks. EXAM: RIGHT FOOT COMPLETE - 3+  VIEW COMPARISON:  November 06, 2021 FINDINGS: There is no evidence of fracture or dislocation. There is no evidence of arthropathy or other focal bone abnormality. Mild to moderate severity vascular calcification is seen. Mild soft tissue swelling is seen along the plantar aspect of the distal right foot. IMPRESSION: Plantar soft tissue swelling, as described above, without an acute osseous abnormality. Electronically Signed   By: Virgina Norfolk M.D.   On: 11/21/2021 21:31    EKG: I independently viewed the EKG done and my findings are as followed: Normal sinus rhythm at a rate of 74 bpm with prolonged QTc (524 ms)  Assessment/Plan Present on Admission:  Lower extremity cellulitis  OSA (obstructive sleep apnea)  Prolonged QT interval  Principal Problem:   Lower extremity cellulitis Active Problems:   Essential hypertension   COPD (chronic obstructive pulmonary disease) ([facility])   Prolonged QT interval   OSA (obstructive sleep apnea)   Morbid obesity with BMI of 50.0-59.9, adult ([facility])   Open wound of left foot   Chronic venous stasis   Leukocytosis   Thrombocytosis   Hyperglycemia due to diabetes mellitus ([facility])   Lactic acidosis   Hypoalbuminemia due to protein-calorie malnutrition ([facility])   Elevated liver enzymes   Mixed hyperlipidemia   GERD (gastroesophageal reflux disease)   Chronic diastolic CHF (congestive heart failure)  ([facility])   Left foot ulcerative wound with probable bilateral lower extremity cellulitis s/p outpatient antibiotic treatment failure Continue IV vancomycin Continue wound care  Leukocytosis possibly due to above versus reactive WBC 18.8, continue management as described above  Nausea Continue Compazine  Prolonged QTc (524 ms) Avoid QT prolonging drugs Magnesium level was normal at 2.0  Thrombocytosis possibly reactive Platelets 458.  Continue to monitor platelet levels.  Lactic acidosis possibly due to dehydration Lactic acid 2.0, continue IV hydration Continue to trend lactic acid  Hyperglycemia possibly secondary to type II DM Continue ISS and hypoglycemia protocol Continue Semglee 10 units nightly and adjust dose accordingly  Elevated liver enzymes AST 177, ALT 55, patient denies any alcohol use  Continue to monitor liver enzymes Right upper quadrant abdominal ultrasound will be obtained pneumonia  Hypoalbuminemia possibly secondary to parenteral nutrition Albumin 2.6, protein supplement will be provided  OSA (noncompliant with CPAP) CPAP was offered, but patient refused Continue to monitor patient  Essential hypertension (controlled) Patient's BP ranged from soft BP to controlled Continue to monitor BP and treat accordingly  Mixed hyperlipidemia Statin will be held at this time due to elevated liver enzymes  GERD Continue Protonix, famotidine  Chronic diastolic CHF Stable, echo done in June 2016 showed LVEF of 60 to 4% with G2 DD  Chronic venous stasis, Stable, encourage compression stockings on discharge  Morbid obesity (54.87 kg/m) Patient counseled on importance of diet and lifestyle modification Patient will need outpatient PCP follow-up for weight control  DVT prophylaxis: Lovenox  Code Status: Full code  Family Communication: None at bedside  Disposition Plan:  Patient is from:                        home Anticipated DC to:                    SNF or family members home Anticipated DC date:               2-3 days Anticipated DC barriers:          Patient requires inpatient management due to left foot wound  with cellulitis that failed antibiotic treatment    Consults called: None  Admission status: Inpatient    Bernadette Hoit MD [facility]  11/22/2021, 12:24 AM

## 2021-11-21 NOTE — Progress Notes (Signed)
Pharmacy Antibiotic Note  [name] is a 60 y.o. female admitted on 11/21/2021 with  wound infection .  Pharmacy has been consulted for Vancomycin dosing.  Plan: Vancomycin 2000 mg IV x 1, then 1250 mg IV q24h >>>Estimated AUC: 520 Trend WBC, temp, renal function  F/U infectious work-up Drug levels as indicated   Height: 5\' 2"  (157.5 cm) Weight: 136.1 kg (300 lb) IBW/kg (Calculated) : 50.1  Temp (24hrs), Avg:97.9 F (36.6 C), Min:97.9 F (36.6 C), Max:97.9 F (36.6 C)  Recent Labs  Lab 11/21/21 1634 11/21/21 2245  WBC 18.8*  --   CREATININE 1.27*  --   LATICACIDVEN  --  2.0*    Estimated Creatinine Clearance: 62.8 mL/min (A) (by C-G formula based on SCr of 1.27 mg/dL (H)).    Allergies  Allergen Reactions   Carvedilol Palpitations   Benicar [Olmesartan] Swelling   Codeine Other (See Comments)    Confusion    Sulfa Antibiotics Swelling    Whole face swells   Trulicity [Dulaglutide] Diarrhea    Narda Bonds, PharmD, BCPS Clinical Pharmacist Phone: [phone]

## 2021-11-22 ENCOUNTER — Inpatient Hospital Stay (HOSPITAL_COMMUNITY): Payer: Medicare Other

## 2021-11-22 ENCOUNTER — Encounter (HOSPITAL_COMMUNITY): Payer: Self-pay | Admitting: Internal Medicine

## 2021-11-22 DIAGNOSIS — E8809 Other disorders of plasma-protein metabolism, not elsewhere classified: Secondary | ICD-10-CM

## 2021-11-22 DIAGNOSIS — Z794 Long term (current) use of insulin: Secondary | ICD-10-CM

## 2021-11-22 DIAGNOSIS — K219 Gastro-esophageal reflux disease without esophagitis: Secondary | ICD-10-CM

## 2021-11-22 DIAGNOSIS — S91302A Unspecified open wound, left foot, initial encounter: Secondary | ICD-10-CM

## 2021-11-22 DIAGNOSIS — E782 Mixed hyperlipidemia: Secondary | ICD-10-CM

## 2021-11-22 DIAGNOSIS — L089 Local infection of the skin and subcutaneous tissue, unspecified: Secondary | ICD-10-CM

## 2021-11-22 DIAGNOSIS — D72829 Elevated white blood cell count, unspecified: Secondary | ICD-10-CM

## 2021-11-22 DIAGNOSIS — R748 Abnormal levels of other serum enzymes: Secondary | ICD-10-CM

## 2021-11-22 DIAGNOSIS — E11628 Type 2 diabetes mellitus with other skin complications: Secondary | ICD-10-CM

## 2021-11-22 DIAGNOSIS — I878 Other specified disorders of veins: Secondary | ICD-10-CM

## 2021-11-22 DIAGNOSIS — I5033 Acute on chronic diastolic (congestive) heart failure: Secondary | ICD-10-CM

## 2021-11-22 DIAGNOSIS — D75839 Thrombocytosis, unspecified: Secondary | ICD-10-CM

## 2021-11-22 DIAGNOSIS — E872 Acidosis, unspecified: Secondary | ICD-10-CM

## 2021-11-22 DIAGNOSIS — E1165 Type 2 diabetes mellitus with hyperglycemia: Secondary | ICD-10-CM

## 2021-11-22 DIAGNOSIS — I5032 Chronic diastolic (congestive) heart failure: Secondary | ICD-10-CM

## 2021-11-22 LAB — COMPREHENSIVE METABOLIC PANEL
ALT: 94 U/L — ABNORMAL HIGH (ref 0–44)
AST: 286 U/L — ABNORMAL HIGH (ref 15–41)
Albumin: 2.5 g/dL — ABNORMAL LOW (ref 3.5–5.0)
Alkaline Phosphatase: 115 U/L (ref 38–126)
Anion gap: 12 (ref 5–15)
BUN: 22 mg/dL — ABNORMAL HIGH (ref 6–20)
CO2: 28 mmol/L (ref 22–32)
Calcium: 8.4 mg/dL — ABNORMAL LOW (ref 8.9–10.3)
Chloride: 100 mmol/L (ref 98–111)
Creatinine, Ser: 1.48 mg/dL — ABNORMAL HIGH (ref 0.44–1.00)
GFR, Estimated: 40 mL/min — ABNORMAL LOW (ref >60)
Glucose, Bld: 293 mg/dL — ABNORMAL HIGH (ref 70–99)
Potassium: 3.9 mmol/L (ref 3.5–5.1)
Sodium: 140 mmol/L (ref 135–145)
Total Bilirubin: 0.9 mg/dL (ref 0.3–1.2)
Total Protein: 6.3 g/dL — ABNORMAL LOW (ref 6.5–8.1)

## 2021-11-22 LAB — CBC
HCT: 39.1 % (ref 36.0–46.0)
Hemoglobin: 12.5 g/dL (ref 12.0–15.0)
MCH: 26.3 pg (ref 26.0–34.0)
MCHC: 32 g/dL (ref 30.0–36.0)
MCV: 82.1 fL (ref 80.0–100.0)
Platelets: 416 10*3/uL — ABNORMAL HIGH (ref 150–400)
RBC: 4.76 MIL/uL (ref 3.87–5.11)
RDW: 15.3 % (ref 11.5–15.5)
WBC: 15.9 10*3/uL — ABNORMAL HIGH (ref 4.0–10.5)
nRBC: 0 % (ref 0.0–0.2)

## 2021-11-22 LAB — HEPATITIS PANEL, ACUTE
HCV Ab: NONREACTIVE
Hep A IgM: NONREACTIVE
Hep B C IgM: NONREACTIVE
Hepatitis B Surface Ag: NONREACTIVE

## 2021-11-22 LAB — GLUCOSE, CAPILLARY
Glucose-Capillary: 286 mg/dL — ABNORMAL HIGH (ref 70–99)
Glucose-Capillary: 289 mg/dL — ABNORMAL HIGH (ref 70–99)
Glucose-Capillary: 383 mg/dL — ABNORMAL HIGH (ref 70–99)
Glucose-Capillary: 436 mg/dL — ABNORMAL HIGH (ref 70–99)
Glucose-Capillary: 220 mg/dL — ABNORMAL HIGH (ref 70–99)

## 2021-11-22 LAB — SEDIMENTATION RATE: Sed Rate: 82 mm/hr — ABNORMAL HIGH (ref 0–22)

## 2021-11-22 LAB — PHOSPHORUS: Phosphorus: 5.1 mg/dL — ABNORMAL HIGH (ref 2.5–4.6)

## 2021-11-22 LAB — LACTIC ACID, PLASMA: Lactic Acid, Venous: 1.8 mmol/L (ref 0.5–1.9)

## 2021-11-22 LAB — HIV ANTIBODY (ROUTINE TESTING W REFLEX): HIV Screen 4th Generation wRfx: NONREACTIVE

## 2021-11-22 LAB — MAGNESIUM: Magnesium: 2.1 mg/dL (ref 1.7–2.4)

## 2021-11-22 LAB — APTT: aPTT: 31 seconds (ref 24–36)

## 2021-11-22 IMAGING — US US ABDOMEN LIMITED
1 series · 14 of 25 positions shown · non-contrast
Comparison: CT abdomen and pelvis, [DATE].

CLINICAL DATA: Elevated liver enzymes. Gallbladder surgically
absent.

EXAM:
ULTRASOUND ABDOMEN LIMITED RIGHT UPPER QUADRANT

[Series 1: us abdomen limited · 14 of 34 slices shown]
[im 1/34]
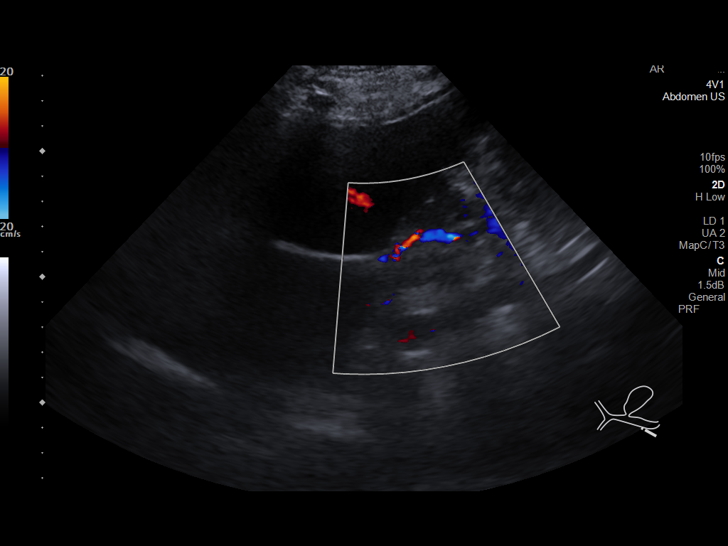
[im 3/34]
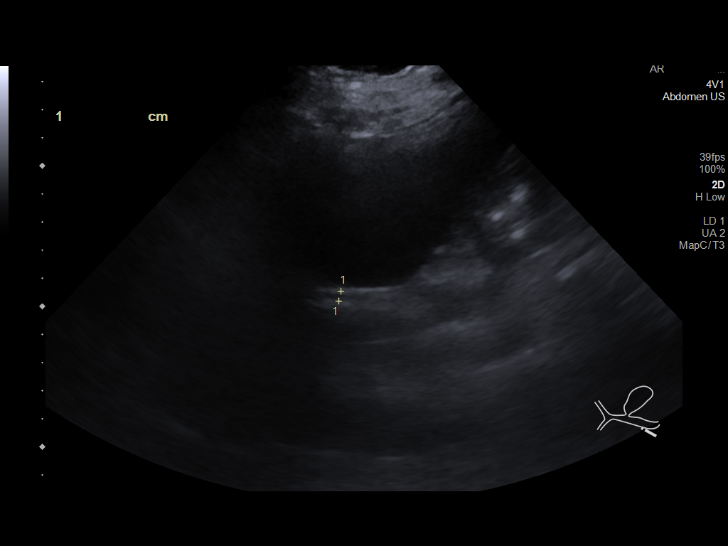
[im 6/34]
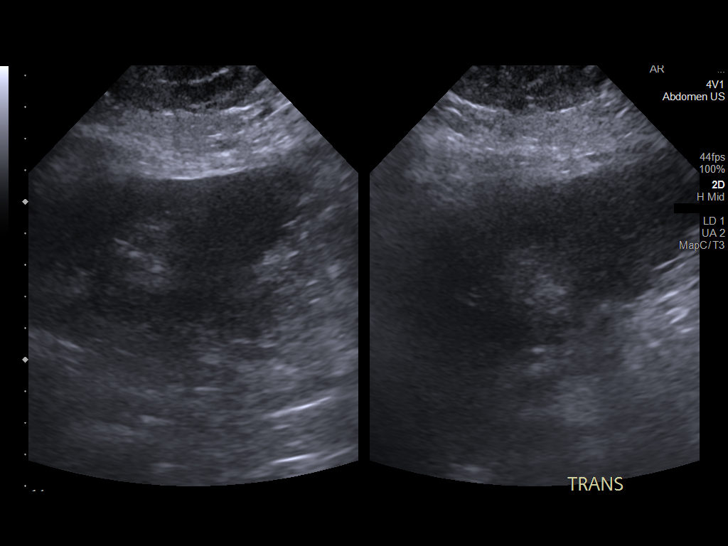
[im 9/34]
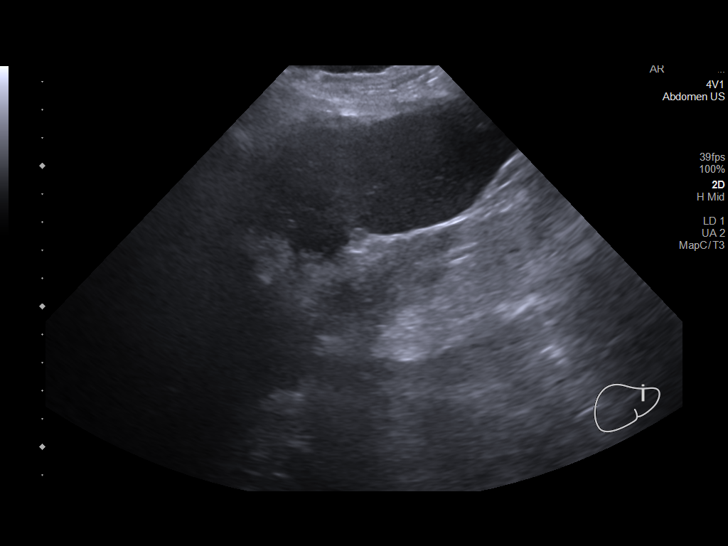
[im 12/34]
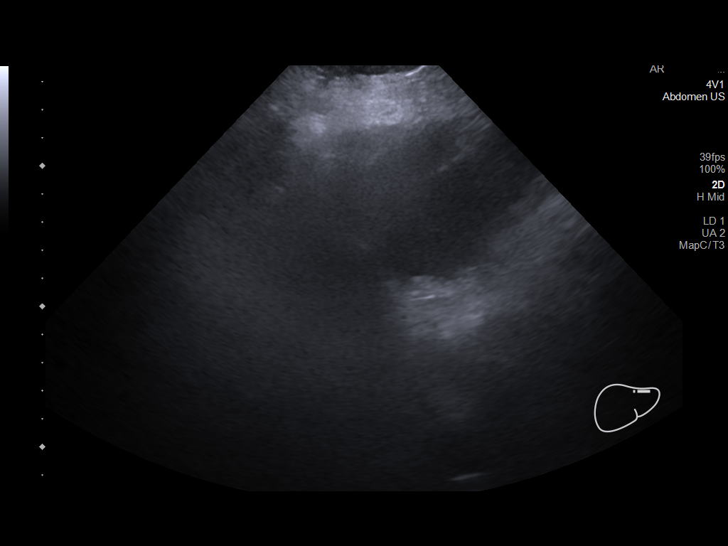
[im 13/34]
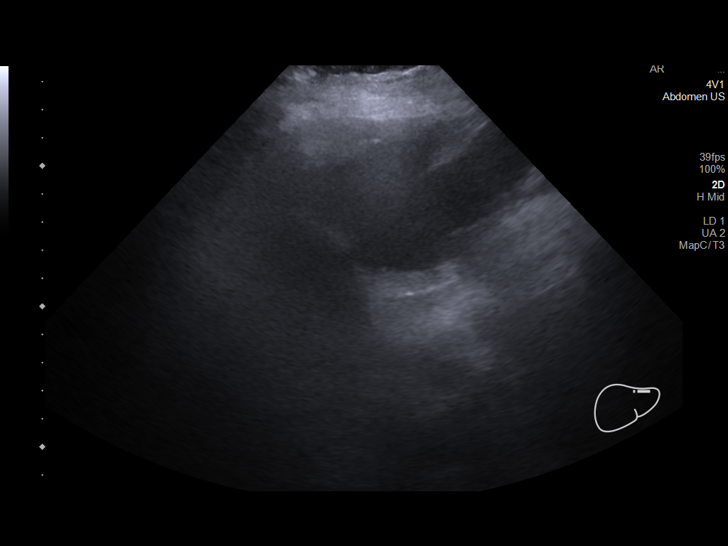
[im 16/34]
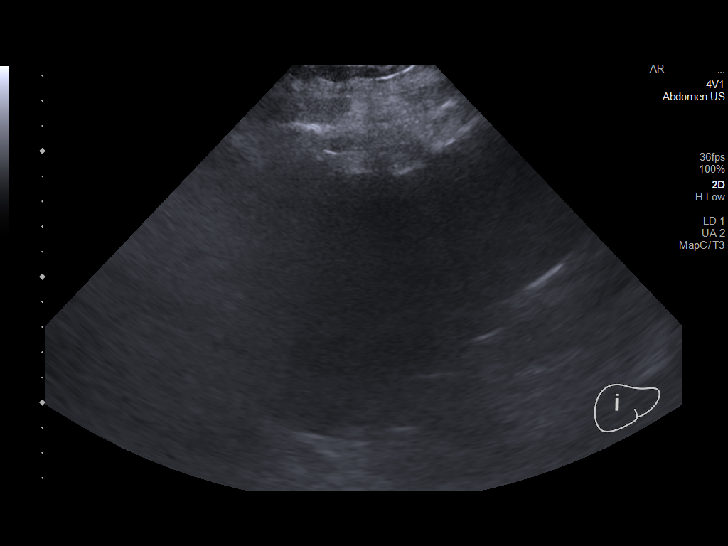
[im 18/34]
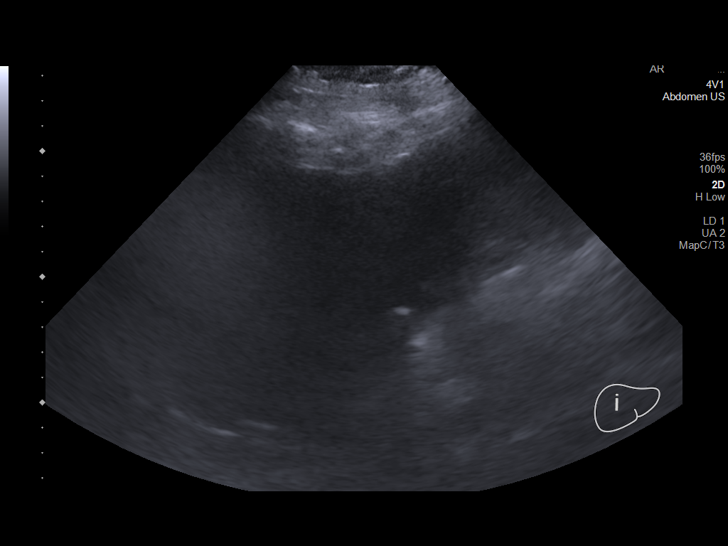
[im 21/34]
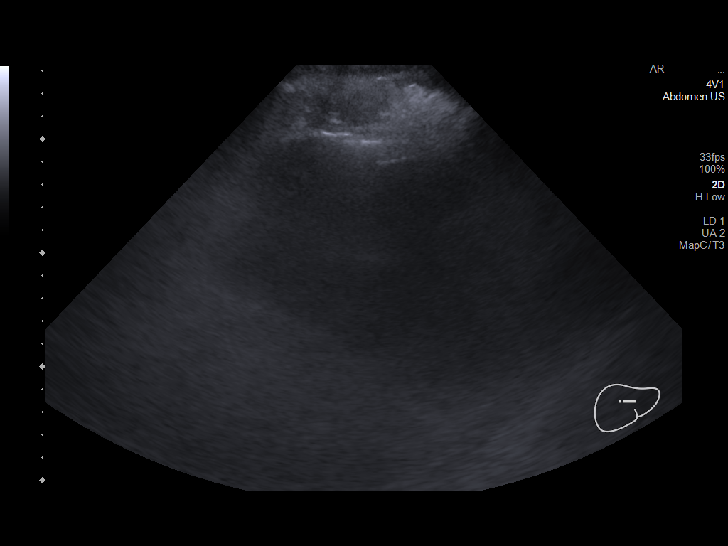
[im 23/34]
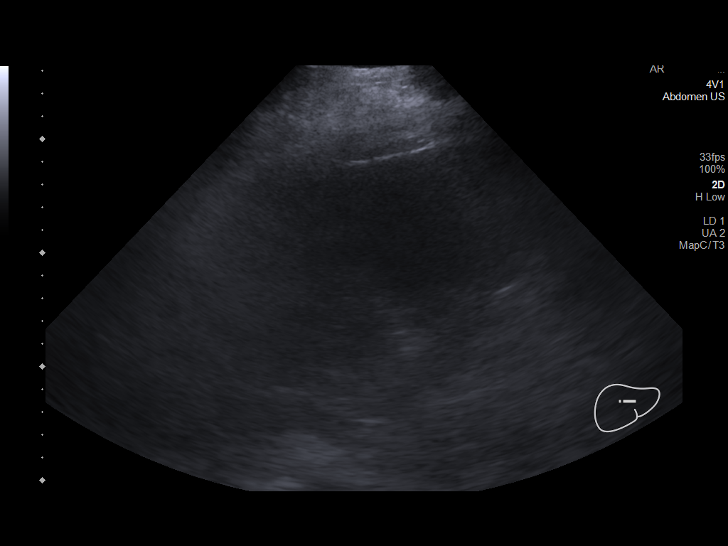
[im 25/34]
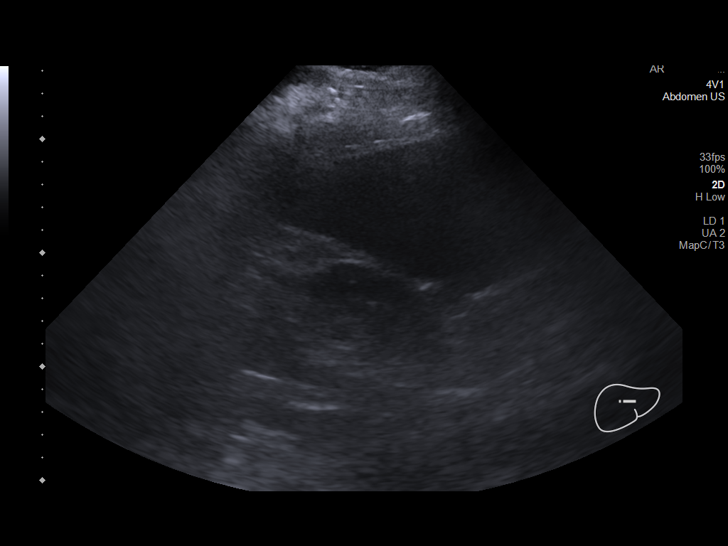
[im 28/34]
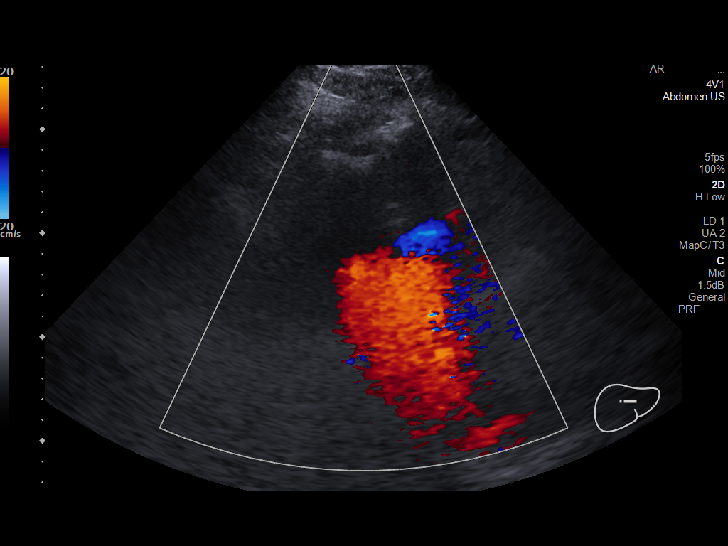
[im 31/34]
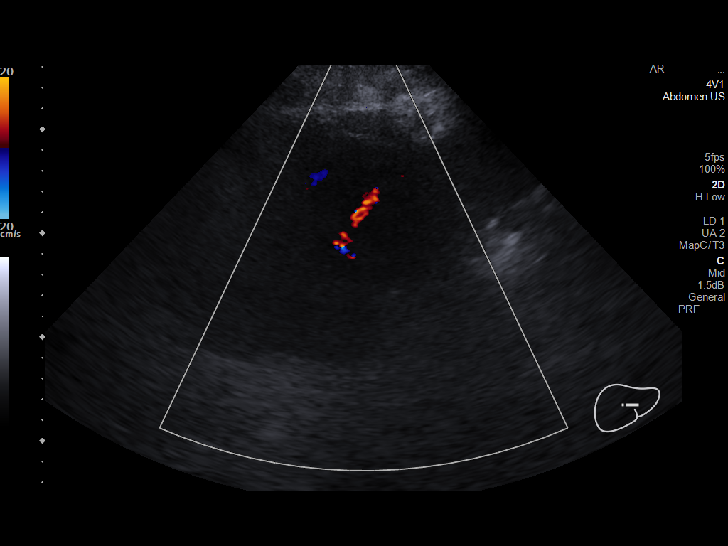
[im 34/34]
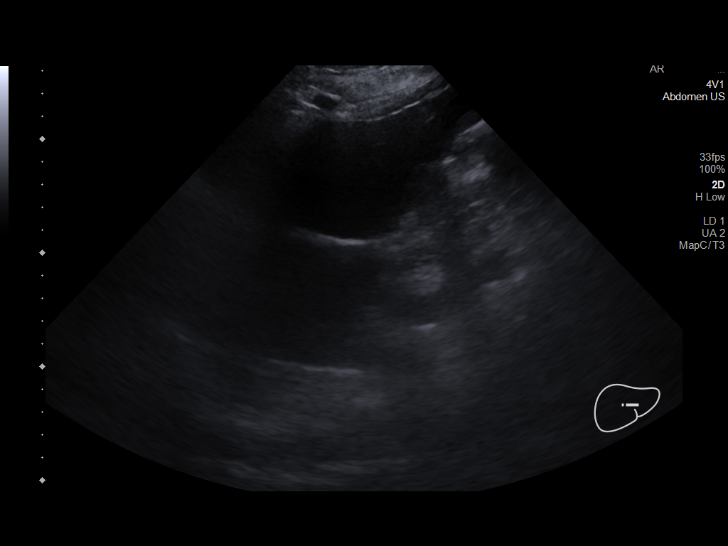

[14 of 25 positions shown; findings below may reference images not displayed]

FINDINGS: Gallbladder:

Surgically absent.

Common bile duct:

Diameter: 4 mm

Liver:

Suboptimally visualized. Normal overall size. No convincing mass.
Portal vein is patent on color Doppler imaging with normal direction
of blood flow towards the liver.

Other: None.
IMPRESSION: 1. Significantly limited study due to the patient's body habitus.
2. No acute findings. No bile duct dilation. Liver not well
assessed, better visualized on the previous day's CT.

## 2021-11-22 MED ORDER — PANTOPRAZOLE SODIUM 40 MG PO TBEC
40.0000 mg | DELAYED_RELEASE_TABLET | Freq: Every day | ORAL | Status: DC
  Administered 2021-11-22 – 2021-11-27 (×6): 40 mg via ORAL
  Filled 2021-11-22 (×6): qty 1

## 2021-11-22 MED ORDER — FAMOTIDINE 20 MG PO TABS
20.0000 mg | ORAL_TABLET | Freq: Two times a day (BID) | ORAL | Status: DC | PRN
  Administered 2021-11-24: 23:00:00 20 mg via ORAL
  Filled 2021-11-22: qty 1

## 2021-11-22 MED ORDER — NYSTATIN 100000 UNIT/GM EX POWD
Freq: Two times a day (BID) | CUTANEOUS | Status: DC
  Filled 2021-11-22 (×2): qty 15

## 2021-11-22 MED ORDER — INSULIN ASPART 100 UNIT/ML IJ SOLN
0.0000 [IU] | Freq: Three times a day (TID) | INTRAMUSCULAR | Status: DC
  Administered 2021-11-22: 08:00:00 8 [IU] via SUBCUTANEOUS

## 2021-11-22 MED ORDER — PROCHLORPERAZINE EDISYLATE 10 MG/2ML IJ SOLN: 5.0000 mg | Freq: Four times a day (QID) | INTRAMUSCULAR | Status: DC | PRN

## 2021-11-22 MED ORDER — INFLUENZA VAC SPLIT QUAD 0.5 ML IM SUSY
0.5000 mL | PREFILLED_SYRINGE | INTRAMUSCULAR | Status: DC
  Filled 2021-11-22: qty 0.5

## 2021-11-22 MED ORDER — INSULIN GLARGINE-YFGN 100 UNIT/ML ~~LOC~~ SOLN
20.0000 [IU] | Freq: Every day | SUBCUTANEOUS | Status: DC
  Administered 2021-11-22 – 2021-11-24 (×3): 20 [IU] via SUBCUTANEOUS
  Filled 2021-11-22 (×4): qty 0.2

## 2021-11-22 MED ORDER — GABAPENTIN 100 MG PO CAPS
100.0000 mg | ORAL_CAPSULE | Freq: Two times a day (BID) | ORAL | Status: DC
  Administered 2021-11-22 – 2021-11-27 (×11): 100 mg via ORAL
  Filled 2021-11-22 (×11): qty 1

## 2021-11-22 MED ORDER — SODIUM CHLORIDE 0.9 % IV SOLN
2.0000 g | INTRAVENOUS | Status: DC
  Administered 2021-11-22 – 2021-11-27 (×6): 2 g via INTRAVENOUS
  Filled 2021-11-22 (×6): qty 20

## 2021-11-22 MED ORDER — INSULIN ASPART 100 UNIT/ML IJ SOLN
30.0000 [IU] | Freq: Once | INTRAMUSCULAR | Status: AC
  Administered 2021-11-22: 17:00:00 30 [IU] via SUBCUTANEOUS

## 2021-11-22 MED ORDER — ENOXAPARIN SODIUM 40 MG/0.4ML IJ SOSY
40.0000 mg | PREFILLED_SYRINGE | INTRAMUSCULAR | Status: DC
  Administered 2021-11-22 – 2021-11-23 (×2): 40 mg via SUBCUTANEOUS
  Filled 2021-11-22 (×2): qty 0.4

## 2021-11-22 MED ORDER — GLUCERNA SHAKE PO LIQD
237.0000 mL | Freq: Three times a day (TID) | ORAL | Status: DC
  Administered 2021-11-22 – 2021-11-27 (×13): 237 mL via ORAL

## 2021-11-22 MED ORDER — INSULIN ASPART 100 UNIT/ML IJ SOLN
0.0000 [IU] | Freq: Three times a day (TID) | INTRAMUSCULAR | Status: DC
  Administered 2021-11-22: 11:00:00 20 [IU] via SUBCUTANEOUS
  Administered 2021-11-23: 12:00:00 7 [IU] via SUBCUTANEOUS
  Administered 2021-11-23: 08:00:00 4 [IU] via SUBCUTANEOUS
  Administered 2021-11-23: 18:00:00 11 [IU] via SUBCUTANEOUS
  Administered 2021-11-24: 17:00:00 15 [IU] via SUBCUTANEOUS
  Administered 2021-11-24: 09:00:00 3 [IU] via SUBCUTANEOUS
  Administered 2021-11-24: 12:00:00 7 [IU] via SUBCUTANEOUS
  Administered 2021-11-25: 18:00:00 15 [IU] via SUBCUTANEOUS
  Administered 2021-11-25: 08:00:00 11 [IU] via SUBCUTANEOUS

## 2021-11-22 MED ORDER — GERHARDT'S BUTT CREAM
TOPICAL_CREAM | Freq: Three times a day (TID) | CUTANEOUS | Status: DC
  Filled 2021-11-22 (×2): qty 1

## 2021-11-22 MED ORDER — ALBUTEROL SULFATE (2.5 MG/3ML) 0.083% IN NEBU: 3.0000 mL | INHALATION_SOLUTION | Freq: Four times a day (QID) | RESPIRATORY_TRACT | Status: DC | PRN

## 2021-11-22 MED ORDER — SODIUM CHLORIDE 0.9 % IV SOLN: Freq: Once | INTRAVENOUS | Status: AC

## 2021-11-22 MED ORDER — INSULIN GLARGINE-YFGN 100 UNIT/ML ~~LOC~~ SOLN: 10.0000 [IU] | Freq: Every day | SUBCUTANEOUS | Status: DC

## 2021-11-22 MED ORDER — INSULIN ASPART 100 UNIT/ML IJ SOLN
0.0000 [IU] | Freq: Every day | INTRAMUSCULAR | Status: DC
  Administered 2021-11-22 – 2021-11-23 (×2): 2 [IU] via SUBCUTANEOUS
  Administered 2021-11-24: 23:00:00 3 [IU] via SUBCUTANEOUS
  Administered 2021-11-25: 22:00:00 2 [IU] via SUBCUTANEOUS

## 2021-11-22 MED ORDER — LACTATED RINGERS IV BOLUS
1000.0000 mL | Freq: Once | INTRAVENOUS | Status: AC
  Administered 2021-11-22: 10:00:00 1000 mL via INTRAVENOUS

## 2021-11-22 MED ORDER — LACTATED RINGERS IV SOLN: INTRAVENOUS | Status: DC

## 2021-11-22 MED ORDER — INSULIN ASPART 100 UNIT/ML IJ SOLN
0.0000 [IU] | Freq: Every day | INTRAMUSCULAR | Status: DC
  Administered 2021-11-22: 01:00:00 3 [IU] via SUBCUTANEOUS

## 2021-11-22 NOTE — Progress Notes (Signed)
LEFT FOOT   LEFT TIB/FIB    LEFT KNEE/POSTERIOR THIGH      LEFT ABD SKIN FOLD    RIGHT ABD SKIN FOLD    RIGHT LEG/FOOT

## 2021-11-22 NOTE — Progress Notes (Signed)
Patient admitted to floor. Patient alert and oriented x 4.  Patient came up to floor and skin assessment performed. Patient has MASD under bilateral breasts and abdominal folds.  Patient has multiple excoriations to left hip, extended into abdominal folds.  Patient has redness to both bilateral extremities.  Patient has a stage 3 to left outer posterior leg measuring 2.5 cm x 2.5 cm.  Paitent has a stage 2 to back of knee measuring 3cm x 2.5 cm.  Patient has an unstageable area to left hip measuring 2 cm x 1.5 cm.

## 2021-11-22 NOTE — Consult Note (Signed)
WOC Nurse Consult Note: Reason for Consult:Numerous skin lesions, see photos taken and uploaded to the EMR by Dr. Carles Collet. Wound type:Neuropathic, dermatologic, venous insufficiency vs mixed etiology, moisture associated skin damage, specifically erythema intertrigo ICD-10 CM Codes for Irritant Dermatitis L30.4  - Erythema intertrigo. Also used for abrasion of the hand, chafing of the skin, dermatitis due to sweating and friction, friction dermatitis, friction eczema, and genital/thigh intertrigo.    Pressure Injury POA: N/A Measurement:Numerous full and partial thickness skin lesions are not measured today. 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  A mattress replacement with low air loss feature will be provided and DermaTherapy bed linens used for her skin conditions.  Wound care guidance to the bilateral great toe ulcerations (full thickness, plantar aspect)  is provided using a soap and water cleanse followed by an antimicrobial nonadherent dressing placement (xeroform) but consultation with Podiatry Consult is recommended as both ulcerations are full thickness with nonviable tissue obscuring their true depth. There is purulent discharge from the right foot, first metatarsal head wound. This is also to be applied to the left lateral LE  full thickness wounds and areas of inflammation, covered with an ABD pad and secured with Kerlix/paper tape.  Our house antimicrobial moisture wicking textile (Interdry) is provided for the subpannicular, inframammary and bilateral inguinal areas.  The abdomen and other areas are provided with Gerhart's Butt Cream, a compounded prescriptive consisting of Zinc Oxide, Lotrimin and hydrocortisone creams. This will be applied to the abdominal skin plaques.  Three areas on the bilateral proximal knees and left lateral/posterior thigh will be cleansed and then painted with a betadine swabstick twice daily.  Consider higher level of care than home for patient who has broken, brittle toenails, some with onychomycosis and other hygiene/personal care issues.  [place] nursing team will not follow, but will remain available to this patient, the nursing and medical teams.  Please re-consult if needed. Thanks, Teresa Flakes, MSN, RN, [place], [name]  Pager# [phone]

## 2021-11-22 NOTE — Progress Notes (Signed)
PROGRESS NOTE  [name]:[id number] DOB: May 31, 1961 DOA: 11/21/2021 PCP: Bridget Hartshorn, NP  Brief History:  60 year old female with a history of OSA, diabetes mellitus type 2, hypertension, depression, morbid obesity presenting with generalized weakness with associated nausea and vomiting for the past 2 days.  Unfortunately, the patient is a difficult historian at best.  In addition, I also try to contact the patient's spouse at home who is also a difficult historian at best. As best as I was able to decipher, it appears that the patient has had generalized weakness, dizziness, and nausea for the better part of the least the past 2 weeks.  It appears that for the past 5-6 days her symptoms have worsened to the point where she has been essentially unable to get out of bed.  Her spouse states that he has had to help her get out of bed, but he is having difficulty due to his own medical issues.  Occasionally, the patient has been soiling herself in bed.  Prior to her acute illness, the patient was able to make transfers and walk with a walker, but her functional status has significantly declined over the past 2 weeks.  The patient endorses nausea and vomiting for the past 2 days prior to admission.  She has had some loose stools but denies any frank diarrhea, hematochezia, melena.  She denies any chest pain, shortness breath, coughing, hemoptysis.  There is been no fevers, chills, headache, neck pain.  Apparently, the patient visited the emergency department on 11/06/2021 for lower extremity pain.  She was diagnosed with cellulitis and sent home with doxycycline which she has taken.  She states that she chronically has lower extremity pain for which she takes gabapentin.  She is not really able to clarify whether she has had any worsening drainage, erythema, or edema in her bilateral legs.  Unfortunately her husband cannot clarify the situation any further.  When asked about  previous wound care, the patient and spouse do not remember have any chronic wound care, but review of the medical record shows that the patient has been to the wound care center to see Dr. Dellia Nims back in November 2021.  Lastly, the patient and spouse seem to have some poor insight regarding her the patient's insulin usage.  Spouse has to assist the patient with her insulin, but he also states that the patient takes her medications independently.  There is much conflicting information regarding exactly how much insulin the patient is taking.  However review of the medical record shows that she had a recent PCP visit for which was documented that she was taking 70/30 insulin 100 units twice daily.  The patient currently states that she takes 70/3060 units twice daily with Humalog R in the mornings and with supper.  He is not really able to clarify exactly how much Humalog she takes.  In the emergency department, the patient was afebrile hemodynamically stable with oxygen saturation 99% room air.  BMP showed sodium 141, potassium 4.0, bicarbonate 32, serum creatinine 1.27.  AST is 177, ALT 55, alkaline phosphatase 118, total bilirubin 1.1.  WBC 18.8, hemoglobin 14.5, platelets 450,000.  Lactic acid 2.0.  The patient was started on vancomycin.  She was given a 1 L bolus of normal saline.   Assessment/Plan: Diabetic foot infection -Start vancomycin and ceftriaxone -Check ABIs -Wound care consult for numerous lower extremity wounds -ESR -CRP -Personally reviewed x-rays of the foot--no erosions  noted  Uncontrolled diabetes mellitus type 2 with hyperglycemia -The patient had anion gap 12 with ketonuria but normal bicarbonate -Start IV fluids -Increase Semglee to 20 units -Resistant sliding scale -Check A1c  Abdominal wall cellulitis -Antibiotics as discussed above -Wound care consult for skin fold wounds  Transaminasemia -Holding statin -11/21/2021 CT abd--skin thickening left anterior  abdominal wall without abscess.  Mild splenomegaly.  Atrophic pancreas.  Status postcholecystectomy.  No bowel wall thickening. -Right upper quadrant ultrasound -Viral hepatitis panel negative -Urine drug screen -EBV DNA -CMV DNA  Acute kidney injury -Baseline creatinine 0.8-1.1 -Serum creatinine peaked at 1.48  Pyuria -UA 11-20 WBC -urine culture was not obtained  Acute metabolic encephalopathy -The patient remains confused -Secondary to infectious process -Further work-up if no improvement  Lactic acidosis -Secondary to volume depletion -Start IV fluids  GERD -continue PPI  Morbid Obesity -BMI 55.40 -lifestyle modification         Family Communication:  spouse updated 12/18  Consultants:  none  Code Status:  FULL   DVT Prophylaxis:  [place] Lovenox   Procedures: As Listed in Progress Note Above  Antibiotics: Vanc 12/17>> Ceftriaxone 12/18>>      Subjective: Pt feels weak all over.  Patient denies fevers, chills, headache, chest pain, dyspnea, vomiting, diarrhea, abdominal pain, dysuria, hematuria, hematochezia, and melena.  She has nausea   Objective: Vitals:   11/22/21 0030 11/22/21 0104 11/22/21 0109 11/22/21 0438  BP: 127/76  136/84 132/67  Pulse:    80  Resp: '18  18 19  ' Temp: 98.8 F (37.1 C)  98.2 F (36.8 C) 98.8 F (37.1 C)  TempSrc: Oral   Oral  SpO2: 96% 99% 99% 95%  Weight:   133 kg   Height:   '5\' 1"'  (1.549 m)     Intake/Output Summary (Last 24 hours) at 11/22/2021 0749 Last data filed at 11/22/2021 0300 Gross per 24 hour  Intake 1636.36 ml  Output 500 ml  Net 1136.36 ml   Weight change:  Exam:  General:  Pt is alert, follows commands appropriately, not in acute distress HEENT: No icterus, No thrush, No neck mass, [place]/AT Cardiovascular: RRR, S1/S2, no rubs, no gallops Respiratory: fine bibasilar crackles.  No wheeze Abdomen: Soft/+BS, non tender, non distended, no guarding Extremities: see pics on my note at 0742  today   Data Reviewed: I have personally reviewed following labs and imaging studies Basic Metabolic Panel: Recent Labs  Lab 11/21/21 1634 11/22/21 0449  NA 141 140  K 4.0 3.9  CL 97* 100  CO2 32 28  GLUCOSE 254* 293*  BUN 18 22*  CREATININE 1.27* 1.48*  CALCIUM 9.0 8.4*  MG 2.0 2.1  PHOS  --  5.1*   Liver Function Tests: Recent Labs  Lab 11/21/21 1634 11/22/21 0449  AST 177* 286*  ALT 55* 94*  ALKPHOS 118 115  BILITOT 1.1 0.9  PROT 7.0 6.3*  ALBUMIN 2.6* 2.5*   Recent Labs  Lab 11/21/21 1634  LIPASE 18   No results for input(s): AMMONIA in the last 168 hours. Coagulation Profile: No results for input(s): INR, PROTIME in the last 168 hours. CBC: Recent Labs  Lab 11/21/21 1634 11/22/21 0449  WBC 18.8* 15.9*  NEUTROABS 16.7*  --   HGB 14.5 12.5  HCT 45.3 39.1  MCV 80.9 82.1  PLT 458* 416*   Cardiac Enzymes: No results for input(s): CKTOTAL, CKMB, CKMBINDEX, TROPONINI in the last 168 hours. BNP: Invalid input(s): POCBNP CBG: Recent Labs  Lab 11/21/21 1653  11/22/21 0106 11/22/21 0709  GLUCAP 250* 286* 289*   HbA1C: No results for input(s): HGBA1C in the last 72 hours. Urine analysis:    Component Value Date/Time   COLORURINE AMBER (A) 11/21/2021 1626   APPEARANCEUR CLOUDY (A) 11/21/2021 1626   LABSPEC 1.016 11/21/2021 1626   PHURINE 5.0 11/21/2021 1626   GLUCOSEU 50 (A) 11/21/2021 1626   HGBUR LARGE (A) 11/21/2021 1626   BILIRUBINUR NEGATIVE 11/21/2021 1626   KETONESUR 5 (A) 11/21/2021 1626   PROTEINUR >=300 (A) 11/21/2021 1626   UROBILINOGEN 0.2 05/15/2015 0112   NITRITE NEGATIVE 11/21/2021 1626   LEUKOCYTESUR NEGATIVE 11/21/2021 1626   Sepsis Labs: '@LABRCNTIP' (procalcitonin:4,lacticidven:4) ) Recent Results (from the past 240 hour(s))  Blood culture (routine x 2)     Status: None (Preliminary result)   Collection Time: 11/21/21 10:45 PM   Specimen: BLOOD RIGHT HAND  Result Value Ref Range Status   Specimen Description   Final     BLOOD RIGHT HAND BOTTLES DRAWN AEROBIC AND ANAEROBIC   Special Requests   Final    Blood Culture adequate volume Performed at [facility], [address] [place], [place] [address]    Culture PENDING  Incomplete   Report Status PENDING  Incomplete  Blood culture (routine x 2)     Status: None (Preliminary result)   Collection Time: 11/21/21 10:54 PM   Specimen: BLOOD RIGHT HAND  Result Value Ref Range Status   Specimen Description   Final    BLOOD LEFT HAND BOTTLES DRAWN AEROBIC AND ANAEROBIC   Special Requests   Final    Blood Culture adequate volume Performed at [facility], [address] [place], [place] [address]    Culture PENDING  Incomplete   Report Status PENDING  Incomplete  Resp Panel by RT-PCR (Flu A&B, Covid) Nasopharyngeal Swab     Status: None   Collection Time: 11/21/21 11:09 PM   Specimen: Nasopharyngeal Swab; Nasopharyngeal(NP) swabs in vial transport medium  Result Value Ref Range Status   SARS Coronavirus 2 by RT PCR NEGATIVE NEGATIVE Final    Comment: (NOTE) SARS-CoV-2 target nucleic acids are NOT DETECTED.  The SARS-CoV-2 RNA is generally detectable in upper respiratory specimens during the acute phase of infection. The lowest concentration of SARS-CoV-2 viral copies this assay can detect is 138 copies/mL. A negative result does not preclude SARS-Cov-2 infection and should not be used as the sole basis for treatment or other patient management decisions. A negative result may occur with  improper specimen collection/handling, submission of specimen other than nasopharyngeal swab, presence of viral mutation(s) within the areas targeted by this assay, and inadequate number of viral copies(<138 copies/mL). A negative result must be combined with clinical observations, patient history, and epidemiological information. The expected result is Negative.  Fact Sheet for Patients:  EntrepreneurPulse.com.au  Fact Sheet for Healthcare  Providers:  IncredibleEmployment.be  This test is no t yet approved or cleared by the [place] FDA and  has been authorized for detection and/or diagnosis of SARS-CoV-2 by FDA under an Emergency Use Authorization (EUA). This EUA will remain  in effect (meaning this test can be used) for the duration of the COVID-19 declaration under Section 564(b)(1) of the Act, 21 U.S.C.section 360bbb-3(b)(1), unless the authorization is terminated  or revoked sooner.       Influenza A by PCR NEGATIVE NEGATIVE Final   Influenza B by PCR NEGATIVE NEGATIVE Final    Comment: (NOTE) The Xpert Xpress SARS-CoV-2/FLU/RSV plus assay is intended as an aid  in the diagnosis of influenza from Nasopharyngeal swab specimens and should not be used as a sole basis for treatment. Nasal washings and aspirates are unacceptable for Xpert Xpress SARS-CoV-2/FLU/RSV testing.  Fact Sheet for Patients: EntrepreneurPulse.com.au  Fact Sheet for Healthcare Providers: IncredibleEmployment.be  This test is not yet approved or cleared by the [place] FDA and has been authorized for detection and/or diagnosis of SARS-CoV-2 by FDA under an Emergency Use Authorization (EUA). This EUA will remain in effect (meaning this test can be used) for the duration of the COVID-19 declaration under Section 564(b)(1) of the Act, 21 U.S.C. section 360bbb-3(b)(1), unless the authorization is terminated or revoked.  Performed at [facility], [address] [place], [place] [address]      Scheduled Meds:  enoxaparin (LOVENOX) injection  40 mg Subcutaneous Q24H   feeding supplement (GLUCERNA SHAKE)  237 mL Oral TID BM   gabapentin  100 mg Oral BID   [START ON 11/23/2021] influenza vac split quadrivalent PF  0.5 mL Intramuscular Tomorrow-1000   insulin aspart  0-15 Units Subcutaneous TID WC   insulin aspart  0-5 Units Subcutaneous QHS   [START ON 11/23/2021] insulin  glargine-yfgn  10 Units Subcutaneous QHS   nystatin cream   Topical BID   pantoprazole  40 mg Oral Daily   Continuous Infusions:  vancomycin      Procedures/Studies: CT ABDOMEN PELVIS WO CONTRAST  Result Date: 11/21/2021 CLINICAL DATA:  Left lower quadrant abdominal pain. EXAM: CT ABDOMEN AND PELVIS WITHOUT CONTRAST TECHNIQUE: Multidetector CT imaging of the abdomen and pelvis was performed following the standard protocol without IV contrast. COMPARISON:  CT abdomen and pelvis 09/24/2017. FINDINGS: Lower chest: No acute abnormality. Hepatobiliary: No focal liver abnormality is seen. Status post cholecystectomy. Pancreas: Atrophic. Spleen: Mildly enlarged, unchanged. Adrenals/Urinary Tract: There is a 16 mm cyst in the right kidney. Otherwise, the adrenal glands, kidneys and bladder are within normal limits. Stomach/Bowel: Stomach is within normal limits. There is distal esophageal wall thickening. Appendix appears normal. No evidence of bowel wall thickening, distention, or inflammatory changes. Vascular/Lymphatic: Aortic atherosclerosis. No enlarged abdominal or pelvic lymph nodes. Reproductive: Uterus and bilateral adnexa are unremarkable. Other: There is no ascites or focal abdominal wall hernia. There is mild body wall edema. There is skin thickening measuring up to 16 mm in the left anterior abdominal wall. No focal fluid collection or soft tissue gas. Musculoskeletal: Multilevel degenerative changes affect the spine IMPRESSION: 1. Skin thickening in the left anterior abdominal wall. Correlate clinically for infection. No evidence for abscess or soft tissue gas. 2. Mild splenomegaly. 3. Wall thickening of the distal esophagus concerning for esophagitis. Recommend clinical correlation follow-up to exclude underlying lesion. 4.  Aortic Atherosclerosis (ICD10-I70.0). Electronically Signed   By: Ronney Asters M.D.   On: 11/21/2021 21:18   DG Tibia/Fibula Right  Result Date: 11/06/2021 CLINICAL DATA:   Fall, ulcers EXAM: RIGHT TIBIA AND FIBULA - 2 VIEW; RIGHT FOOT - 2 VIEW; LEFT FOOT - 2 VIEW COMPARISON:  None. FINDINGS: Right tibia, fibula and right foot: No evidence of fracture or dislocation. Vascular calcifications. Soft tissue swelling seen about the right first MTP joint. No osseous destruction or periosteal reaction. No significant degenerative changes. Left foot: No evidence fracture or dislocation. Vascular calcifications. Ulcer the superficial soft tissues overlying the first MTP joint. No osseous destruction or periosteal reaction. No significant degenerative changes. IMPRESSION: No evidence of fracture or osteomyelitis. Electronically Signed   By: Yetta Glassman M.D.   On: 11/06/2021 15:32  DG Foot 2 Views Left  Result Date: 11/06/2021 CLINICAL DATA:  Fall, ulcers EXAM: RIGHT TIBIA AND FIBULA - 2 VIEW; RIGHT FOOT - 2 VIEW; LEFT FOOT - 2 VIEW COMPARISON:  None. FINDINGS: Right tibia, fibula and right foot: No evidence of fracture or dislocation. Vascular calcifications. Soft tissue swelling seen about the right first MTP joint. No osseous destruction or periosteal reaction. No significant degenerative changes. Left foot: No evidence fracture or dislocation. Vascular calcifications. Ulcer the superficial soft tissues overlying the first MTP joint. No osseous destruction or periosteal reaction. No significant degenerative changes. IMPRESSION: No evidence of fracture or osteomyelitis. Electronically Signed   By: Yetta Glassman M.D.   On: 11/06/2021 15:32   DG Foot 2 Views Right  Result Date: 11/06/2021 CLINICAL DATA:  Fall, ulcers EXAM: RIGHT TIBIA AND FIBULA - 2 VIEW; RIGHT FOOT - 2 VIEW; LEFT FOOT - 2 VIEW COMPARISON:  None. FINDINGS: Right tibia, fibula and right foot: No evidence of fracture or dislocation. Vascular calcifications. Soft tissue swelling seen about the right first MTP joint. No osseous destruction or periosteal reaction. No significant degenerative changes. Left foot: No  evidence fracture or dislocation. Vascular calcifications. Ulcer the superficial soft tissues overlying the first MTP joint. No osseous destruction or periosteal reaction. No significant degenerative changes. IMPRESSION: No evidence of fracture or osteomyelitis. Electronically Signed   By: Yetta Glassman M.D.   On: 11/06/2021 15:32   DG Foot Complete Left  Result Date: 11/21/2021 CLINICAL DATA:  Wounds to both feet x2 weeks. EXAM: LEFT FOOT - COMPLETE 3+ VIEW COMPARISON:  November 06, 2021 FINDINGS: There is no evidence of fracture or dislocation. There is no evidence of arthropathy or other focal bone abnormality. A 1.2 cm superficial soft tissue ulceration is seen along the plantar aspect of the distal left foot. Moderate severity soft tissue swelling is also seen within this region. IMPRESSION: Plantar soft tissue ulceration and associated soft tissue swelling, as described above, without an acute osseous abnormality. Electronically Signed   By: Virgina Norfolk M.D.   On: 11/21/2021 21:33   DG Foot Complete Right  Result Date: 11/21/2021 CLINICAL DATA:  Wounds to both feet x2 weeks. EXAM: RIGHT FOOT COMPLETE - 3+ VIEW COMPARISON:  November 06, 2021 FINDINGS: There is no evidence of fracture or dislocation. There is no evidence of arthropathy or other focal bone abnormality. Mild to moderate severity vascular calcification is seen. Mild soft tissue swelling is seen along the plantar aspect of the distal right foot. IMPRESSION: Plantar soft tissue swelling, as described above, without an acute osseous abnormality. Electronically Signed   By: Virgina Norfolk M.D.   On: 11/21/2021 21:31    Orson Eva, DO  [facility]  If 7PM-7AM, please contact night-coverage [email or web address] Password [email or web address] 11/22/2021, 7:49 AM   LOS: 1 day

## 2021-11-23 ENCOUNTER — Encounter (HOSPITAL_COMMUNITY): Payer: Self-pay | Admitting: Radiology

## 2021-11-23 ENCOUNTER — Inpatient Hospital Stay (HOSPITAL_COMMUNITY): Payer: Medicare Other

## 2021-11-23 ENCOUNTER — Inpatient Hospital Stay: Payer: Self-pay

## 2021-11-23 LAB — HEMOGLOBIN A1C
Hgb A1c MFr Bld: 9.9 % — ABNORMAL HIGH (ref 4.8–5.6)
Mean Plasma Glucose: 237.43 mg/dL

## 2021-11-23 LAB — COMPREHENSIVE METABOLIC PANEL
ALT: 106 U/L — ABNORMAL HIGH (ref 0–44)
AST: 258 U/L — ABNORMAL HIGH (ref 15–41)
Albumin: 2.4 g/dL — ABNORMAL LOW (ref 3.5–5.0)
Alkaline Phosphatase: 113 U/L (ref 38–126)
Anion gap: 12 (ref 5–15)
BUN: 25 mg/dL — ABNORMAL HIGH (ref 6–20)
CO2: 26 mmol/L (ref 22–32)
Calcium: 8.4 mg/dL — ABNORMAL LOW (ref 8.9–10.3)
Chloride: 100 mmol/L (ref 98–111)
Creatinine, Ser: 1.54 mg/dL — ABNORMAL HIGH (ref 0.44–1.00)
GFR, Estimated: 38 mL/min — ABNORMAL LOW (ref >60)
Glucose, Bld: 145 mg/dL — ABNORMAL HIGH (ref 70–99)
Potassium: 3.4 mmol/L — ABNORMAL LOW (ref 3.5–5.1)
Sodium: 138 mmol/L (ref 135–145)
Total Bilirubin: 0.5 mg/dL (ref 0.3–1.2)
Total Protein: 6.3 g/dL — ABNORMAL LOW (ref 6.5–8.1)

## 2021-11-23 LAB — CBC
HCT: 36.2 % (ref 36.0–46.0)
Hemoglobin: 11.8 g/dL — ABNORMAL LOW (ref 12.0–15.0)
MCH: 26.2 pg (ref 26.0–34.0)
MCHC: 32.6 g/dL (ref 30.0–36.0)
MCV: 80.4 fL (ref 80.0–100.0)
Platelets: 396 10*3/uL (ref 150–400)
RBC: 4.5 MIL/uL (ref 3.87–5.11)
RDW: 15.3 % (ref 11.5–15.5)
WBC: 12.5 10*3/uL — ABNORMAL HIGH (ref 4.0–10.5)
nRBC: 0 % (ref 0.0–0.2)

## 2021-11-23 LAB — GLUCOSE, CAPILLARY
Glucose-Capillary: 158 mg/dL — ABNORMAL HIGH (ref 70–99)
Glucose-Capillary: 212 mg/dL — ABNORMAL HIGH (ref 70–99)
Glucose-Capillary: 271 mg/dL — ABNORMAL HIGH (ref 70–99)

## 2021-11-23 LAB — C-REACTIVE PROTEIN: CRP: 21.5 mg/dL — ABNORMAL HIGH (ref <1.0)

## 2021-11-23 IMAGING — MR MR FOOT*R* W/O CM
5 series · 40 of 40 positions shown · non-contrast
Comparison: Radiographs dated [DATE].

CLINICAL DATA: Foot swelling, diabetic, osteomyelitis suspected. No
prior imaging.

EXAM:
MRI OF THE RIGHT FOREFOOT WITHOUT CONTRAST
TECHNIQUE: Multiplanar, multisequence MR imaging of the right was performed. No
intravenous contrast was administered.

[Series 3: T1 · coronal · right · 3.0mm · 0.41mm/px · 12 of 50 slices shown (1 of 2)]
[im 1/50]
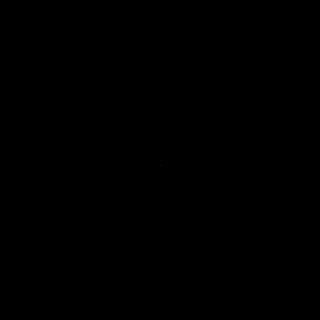
[im 5/50]
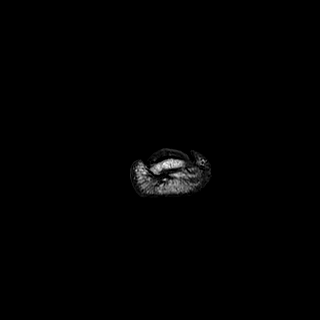
[im 9/50]
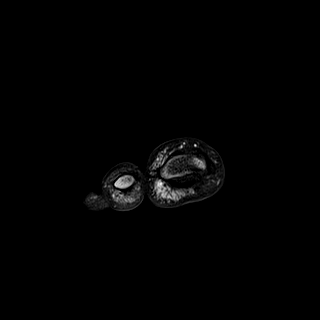
[im 14/50]
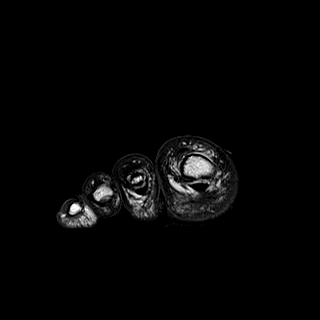
[im 18/50]
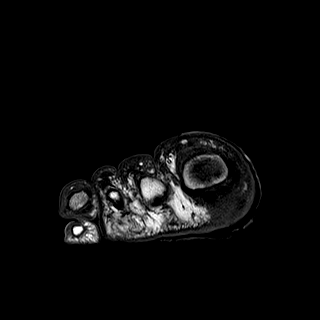
[im 23/50]
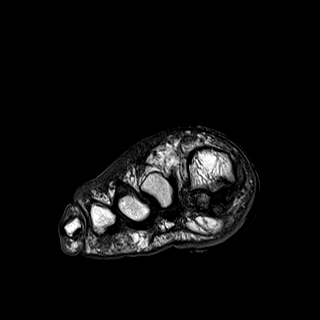
[im 27/50]
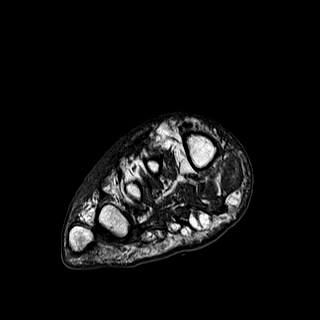
[im 32/50]
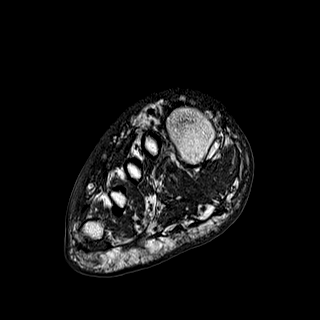
[im 36/50]
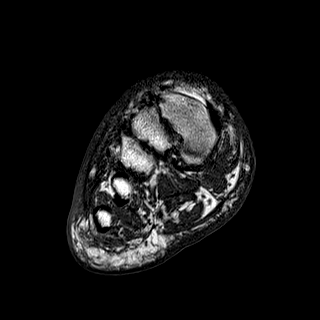
[im 41/50]
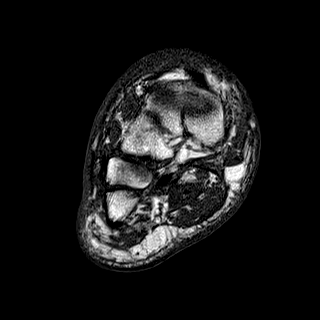
[im 45/50]
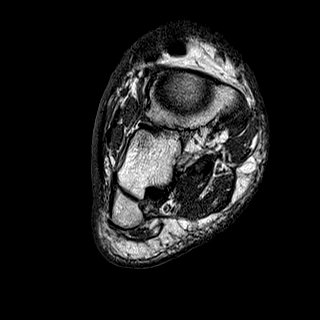
[im 50/50]
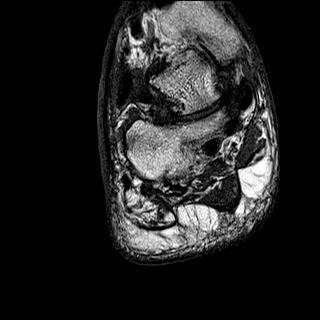

[Series 4: T2 fat-sat · coronal · right · 3.0mm · 0.41mm/px · 12 of 50 slices shown (1 of 2)]
[im 1/50]
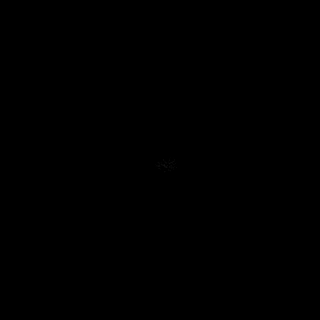
[im 5/50]
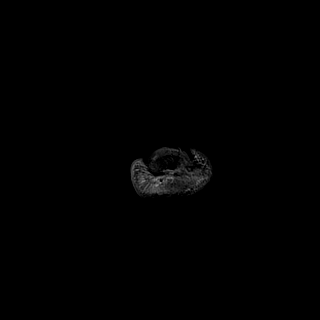
[im 9/50]
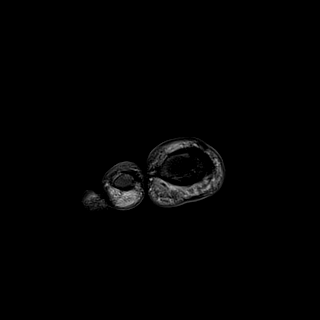
[im 14/50]
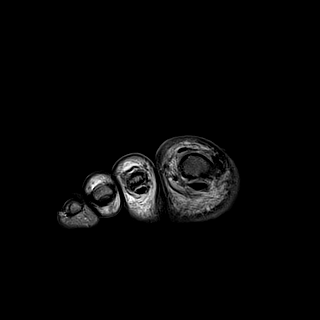
[im 18/50]
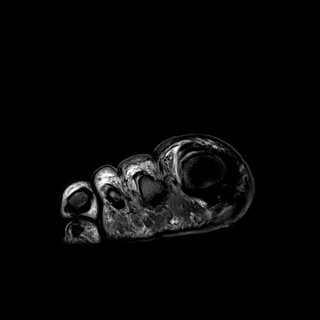
[im 23/50]
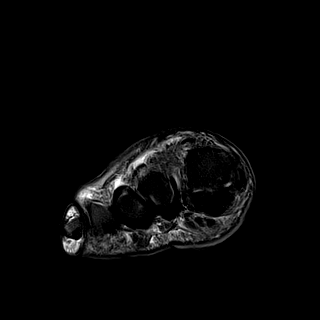
[im 27/50]
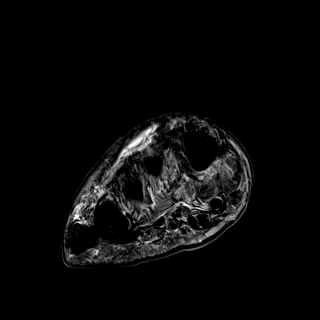
[im 32/50]
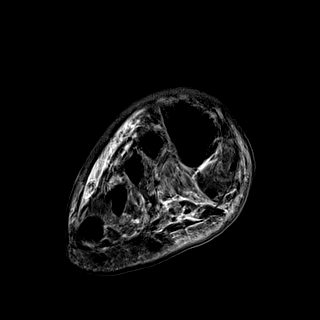
[im 36/50]
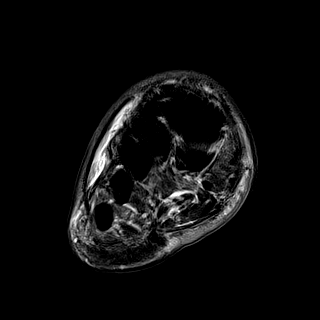
[im 41/50]
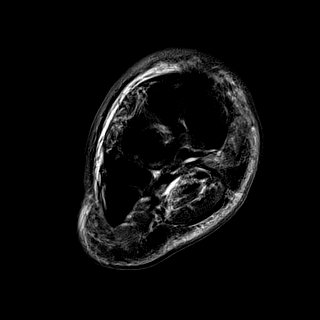
[im 45/50]
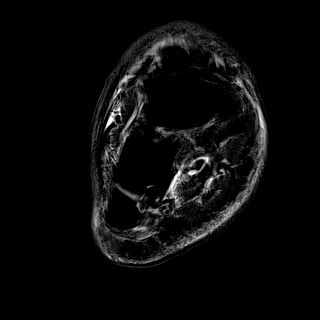
[im 50/50]
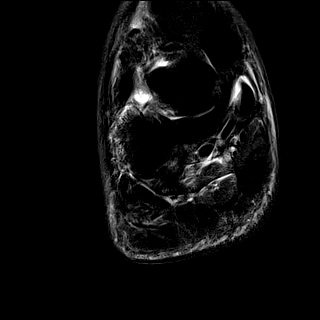

[Series 6: T2 fat-sat · axial · right · 3.0mm · 0.74mm/px · z∈[-87,-11]mm · 5 of 22 slices shown (2 of 2)]
[im 1/22]
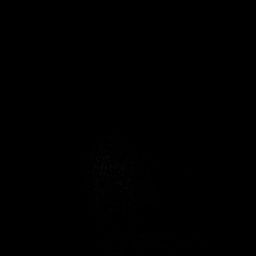
[im 6/22]
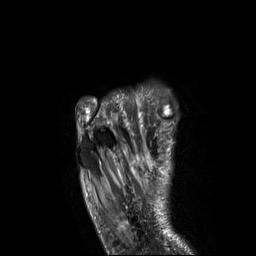
[im 11/22]
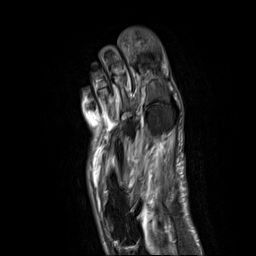
[im 16/22]
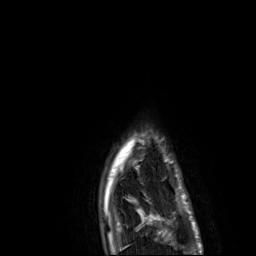
[im 22/22]
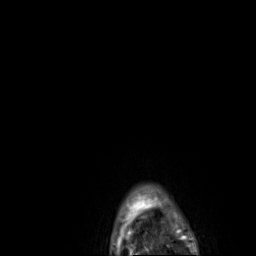

[Series 7: T1 · axial · right · 3.0mm · 0.74mm/px · z∈[-87,-11]mm · 5 of 22 slices shown (2 of 2)]
[im 1/22]
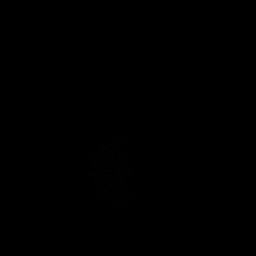
[im 6/22]
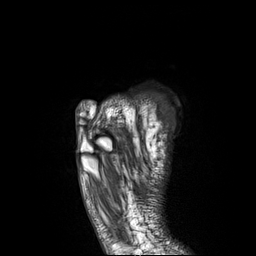
[im 11/22]
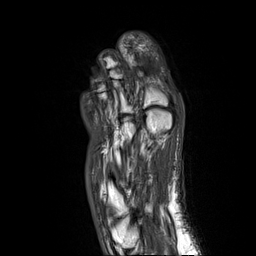
[im 16/22]
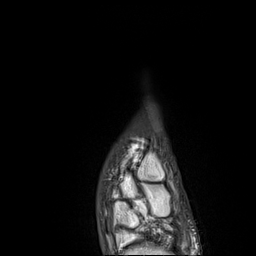
[im 22/22]
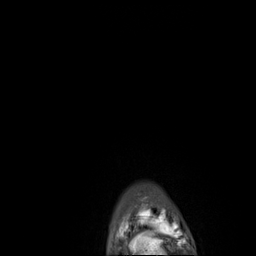

[Series 8: STIR · sagittal · right · 3.0mm · 0.70mm/px · 6 of 27 slices shown]
[im 1/27]
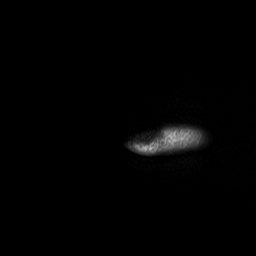
[im 6/27]
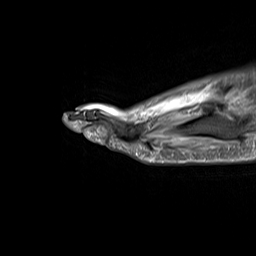
[im 11/27]
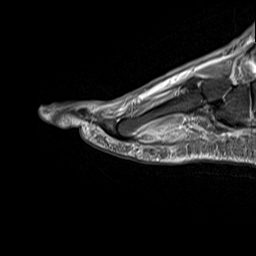
[im 16/27]
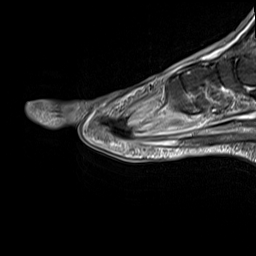
[im 21/27]
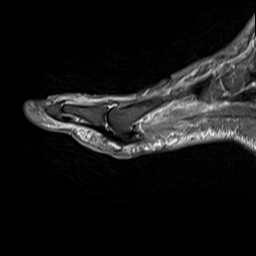
[im 27/27]
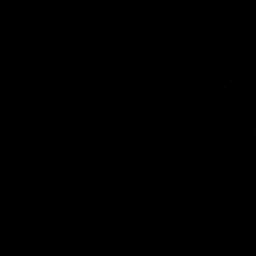

[40 of 40 positions shown; findings below may reference images not displayed]

FINDINGS: Bones/Joint/Cartilage

No fracture or dislocation. Normal alignment. No joint effusion. No
marrow signal abnormality.

Ligaments

Collateral ligaments are intact.  Lisfranc ligament is intact.

Muscles and Tendons

Flexor, peroneal and extensor compartment tendons are intact. Mildly
increased intramuscular signal of plantar muscles concerning for
diabetic myopathy/myositis. No drainable fluid collection or
abscess.

Soft tissue
Skin thickening and prominent subcutaneous soft tissue edema about
the dorsal and plantar aspect of the forefoot. No drainable fluid
collection or abscess. No soft tissue mass.
IMPRESSION: 1.  No MR evidence of acute osteomyelitis.

2. Skin thickening and subcutaneous soft tissue edema consistent
with cellulitis. No drainable fluid collection or abscess.

## 2021-11-23 IMAGING — CT CT TIBIA FIBULA *R* W/ CM
2 of 3 series · 12 of 35 positions shown, 15 images · IV contrast (Omnipaque or Isovue)
Comparison: 75 cc Omnipaque 300

CLINICAL DATA: Leg swelling and sores.

EXAM:
CT OF THE LOWER RIGHT EXTREMITY WITH CONTRAST
TECHNIQUE: Multidetector CT imaging of the lower right extremity was performed
according to the standard protocol following intravenous contrast
administration.
CONTRAST:  75mL OMNIPAQUE IOHEXOL 300 MG/ML  SOLN

[Series 5: coronal st · coronal · 0.48mm/px · 3 of 97 slices shown]
[im 20/97  bone]
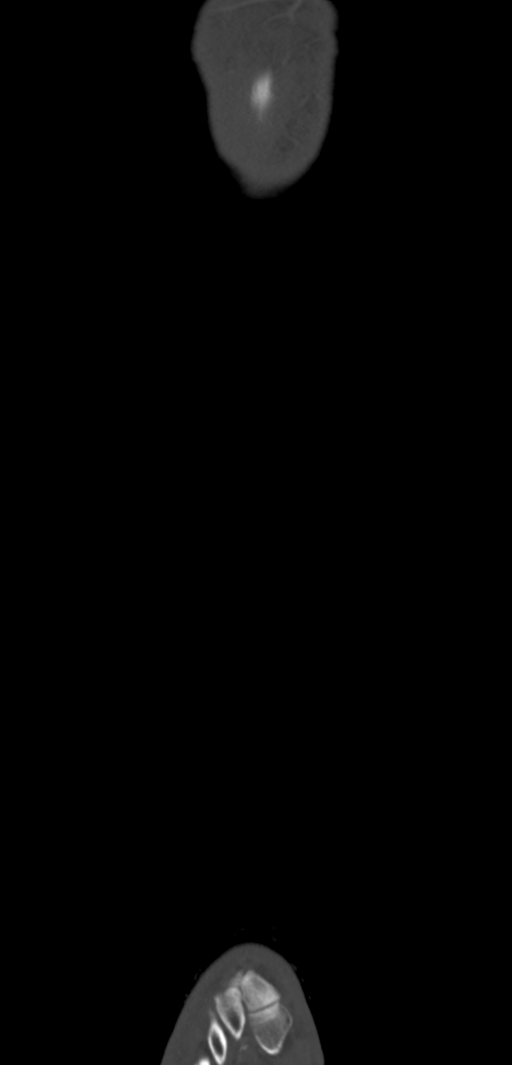
[im 39/97  bone]
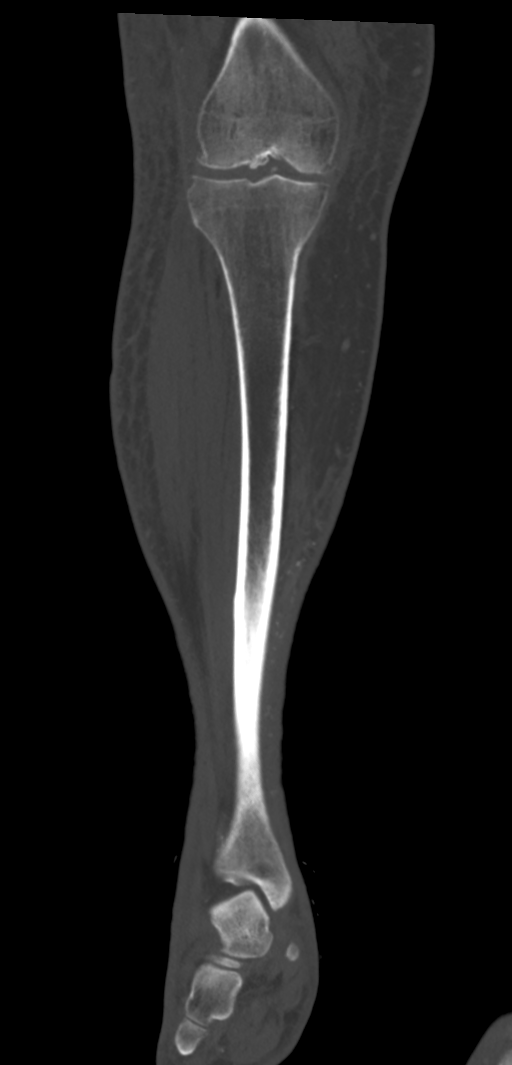
[im 58/97  bone]
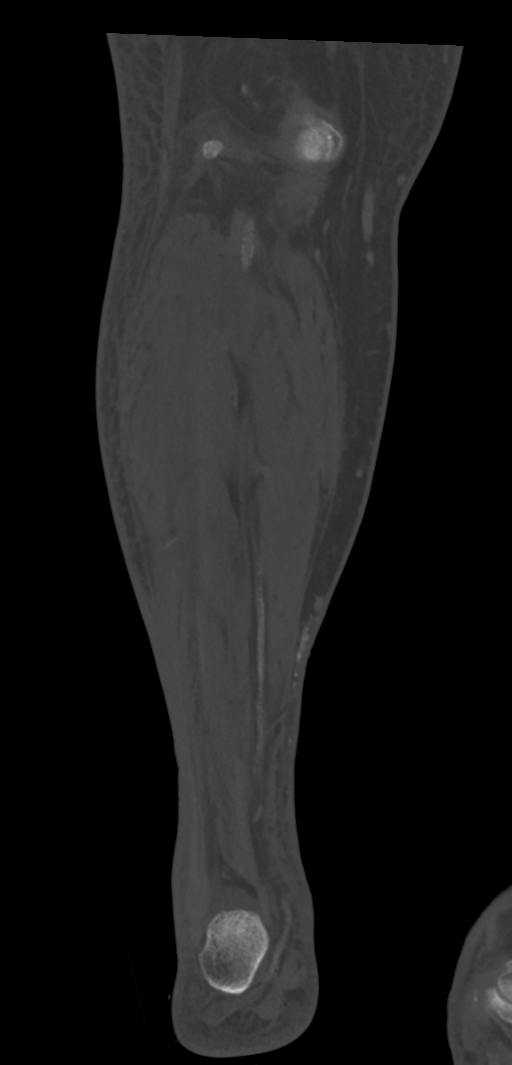

[Series 9: axial st · axial · 0.45mm/px · z∈[+158,+554]mm · 9 of 235 slices shown, 12 images]
[im 19/235  soft-tissue]
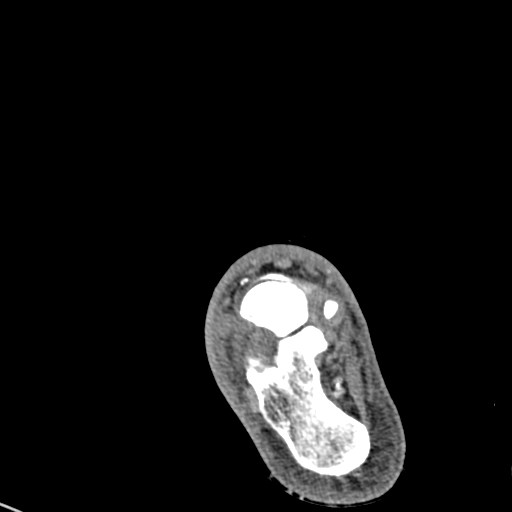
[im 19/235  bone]
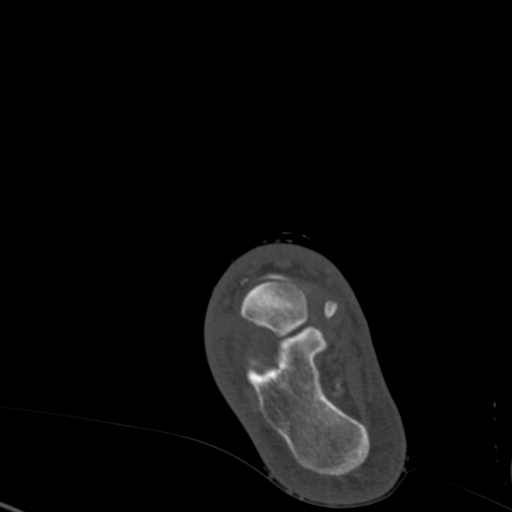
[im 55/235  bone]
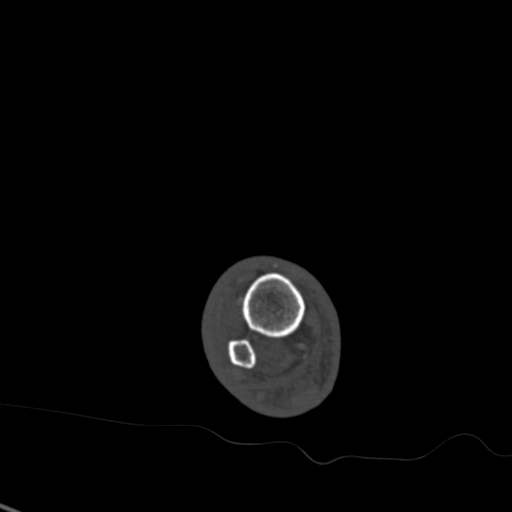
[im 73/235  bone]
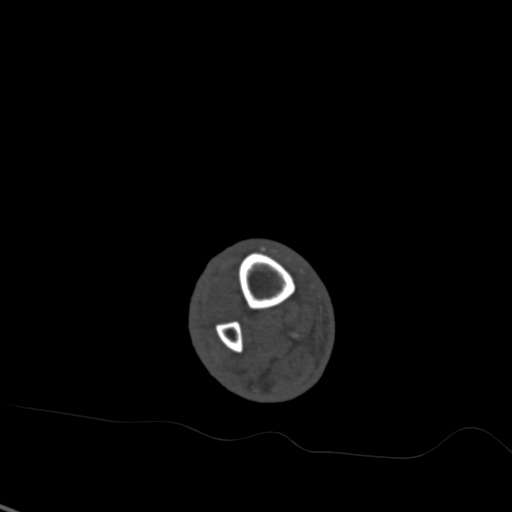
[im 91/235  bone]
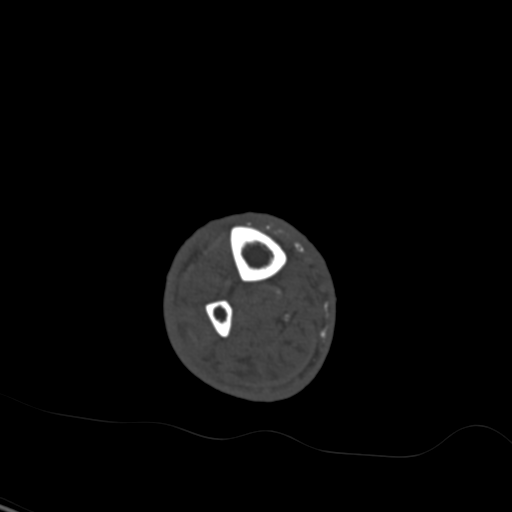
[im 127/235  soft-tissue]
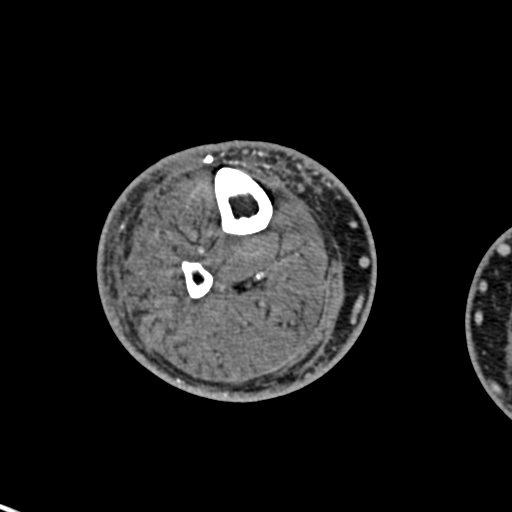
[im 127/235  bone]
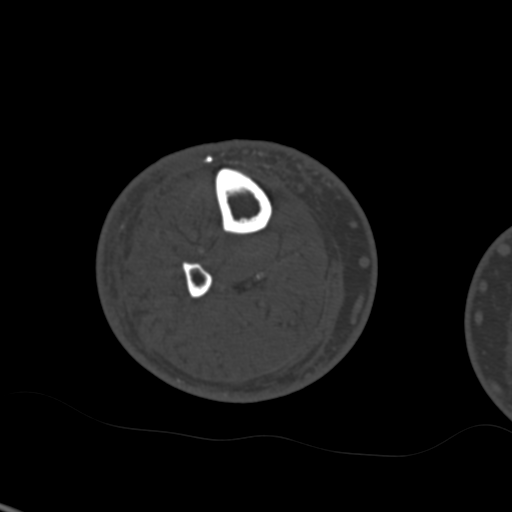
[im 145/235  bone]
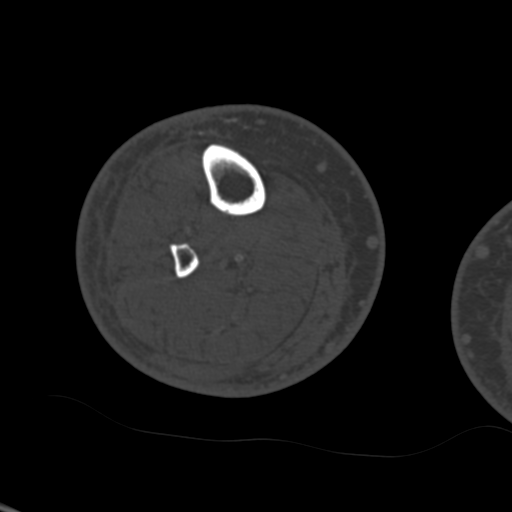
[im 163/235  bone]
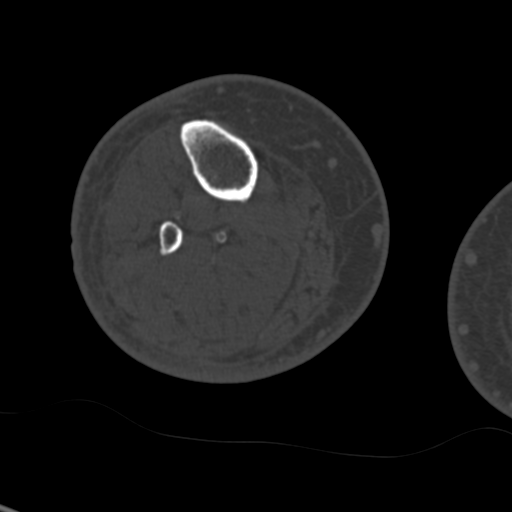
[im 199/235  bone]
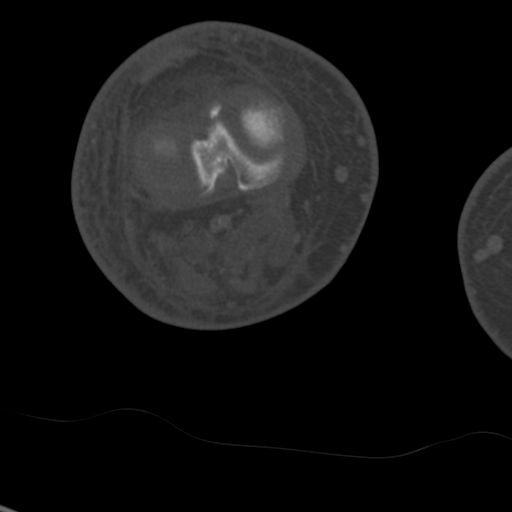
[im 217/235  soft-tissue]
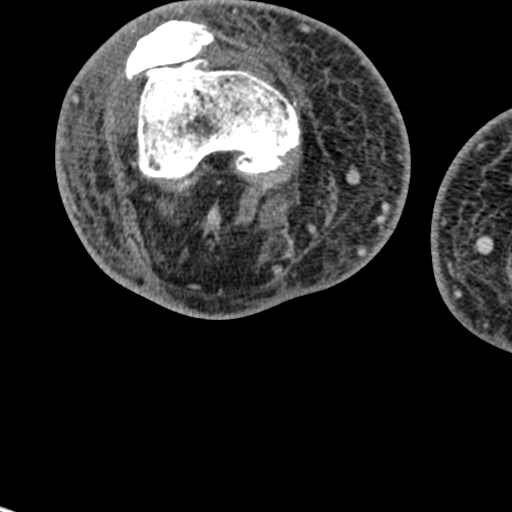
[im 217/235  bone]
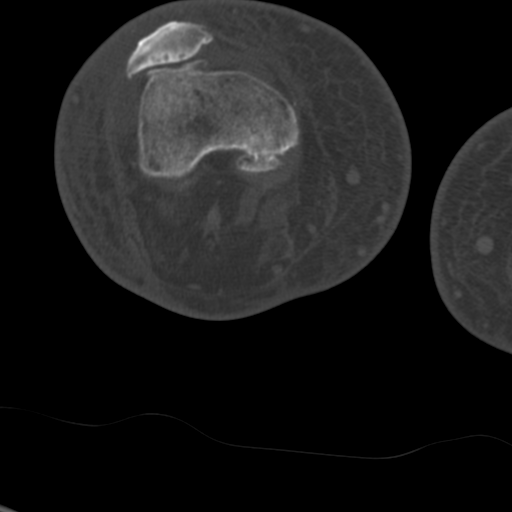

[12 of 35 positions shown; findings below may reference images not displayed]

FINDINGS: Bones/Joint/Cartilage

Knee osteoarthritis especially affecting the patellofemoral
compartment. No bony destructive findings in the tibial or fibular
characteristic of osteomyelitis.

Small knee effusion without a substantial degree of synovitis.
Posterior meniscal chondrocalcinosis versus free osteochondral
fragment along the posterior horn medial meniscus.

Plantar and Achilles calcaneal spurs. Accessory navicular noted.
Small Baker's cyst noted.

Ligaments

Suboptimally assessed by CT.

Muscles and Tendons

No intramuscular abscess or hematoma.

Soft tissues

Subcutaneous edema at the level of the knee is primarily anterior
and lateral. This tracks distally in the calf and is more confluent
in the distal calf and in the ankle region where the appearance is
more circumferential. The cause of this subcutaneous edema is
nonspecific. No subcutaneous abscess is present. There are scattered
irregular subcutaneous calcifications compatible with venous
insufficiency. Atherosclerotic calcification of arterial vascular
structures also noted. Today's exam is not a CT angiogram and does
not adequately assess patency of the arterial vessels distal to the
popliteal artery. I do not see a definite large cutaneous ulceration
or gas tracking in the subcutaneous tissues.
IMPRESSION: 1. No findings of osteomyelitis or drainable abscess.
2. Subcutaneous edema especially in the distal calf and ankle
region. Cellulitis not excluded. No gas in the soft tissues.
3. Subcutaneous calcifications along the calf compatible with
chronic venous insufficiency.
4. Small knee effusion and small Baker's cyst. No substantial
synovitis.
5. Posterior horn medial meniscus chondrocalcinosis versus small
adjacent free osteochondral fragment. Osteoarthritis of the right
knee especially the patellofemoral joint.
6. Plantar and Achilles calcaneal spurs.

## 2021-11-23 IMAGING — CT CT FOOT*L* W/CM
3 of 4 series · 14 of 35 positions shown, 16 images · IV contrast (Omnipaque or Isovue)
Comparison: None.

CLINICAL DATA: Foot swelling, diabetic osteomyelitis suspected.

EXAM:
CT OF THE LOWER LEFT EXTREMITY WITH CONTRAST
TECHNIQUE: Multidetector CT imaging of the lower left extremity was performed
according to the standard protocol following intravenous contrast
administration.
CONTRAST:  75mL OMNIPAQUE IOHEXOL 300 MG/ML  SOLN

[Series 5: coronal st · coronal · 0.42mm/px · 3 of 87 slices shown]
[im 22/87  bone]
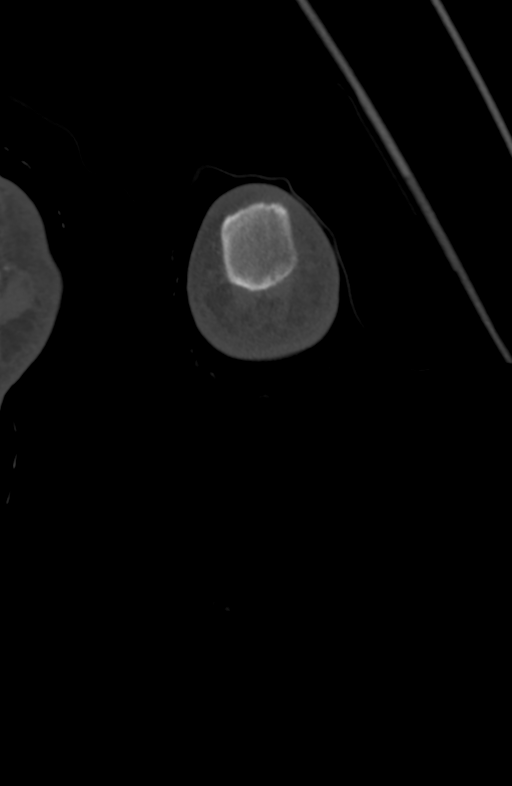
[im 36/87  bone]
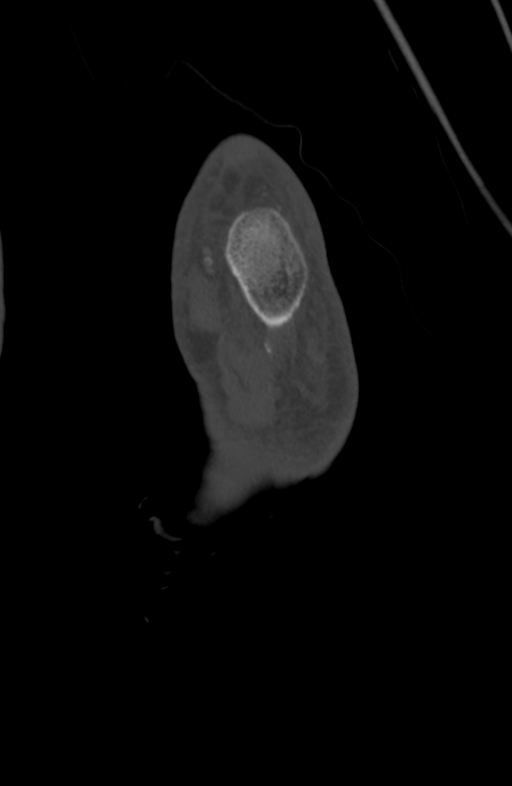
[im 51/87  bone]
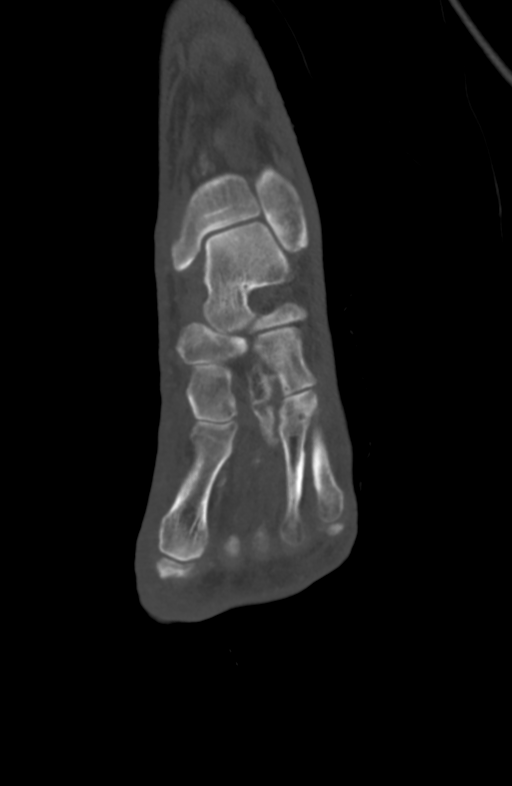

[Series 6: sag st · sagittal · 0.49mm/px · 5 of 88 slices shown]
[im 17/88  bone]
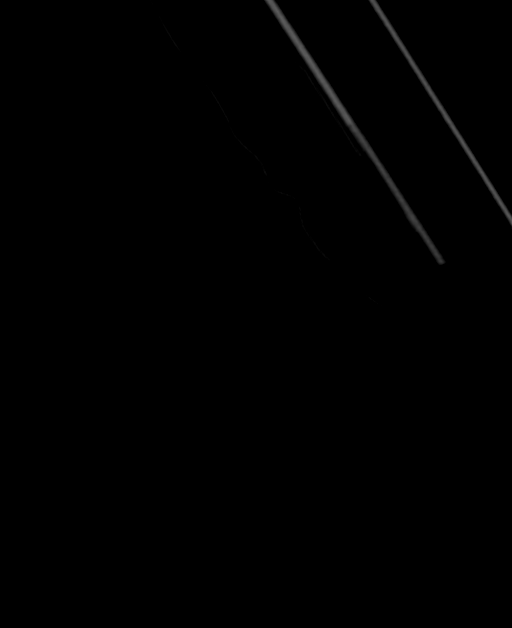
[im 25/88  bone]
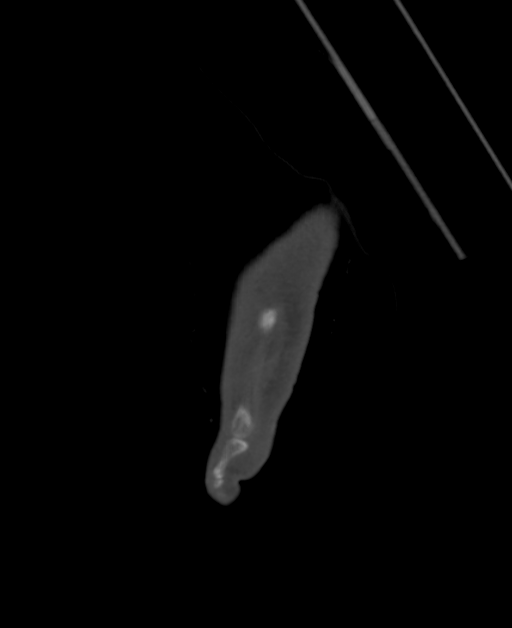
[im 33/88  bone]
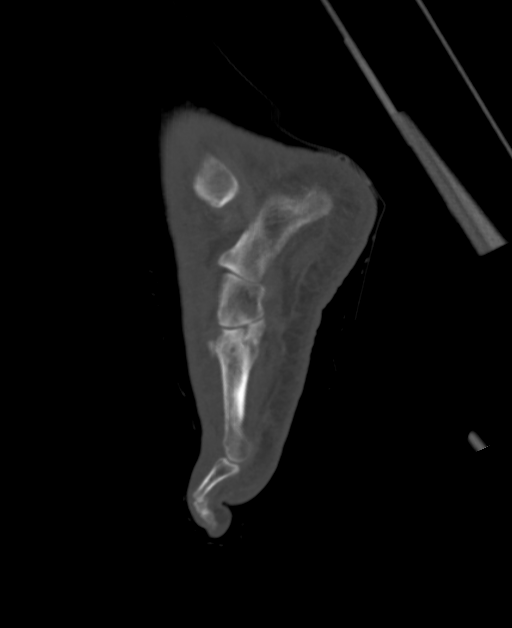
[im 41/88  bone]
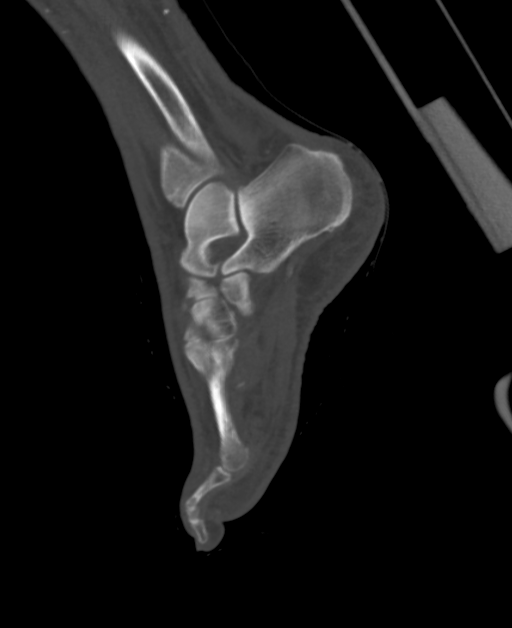
[im 49/88  bone]
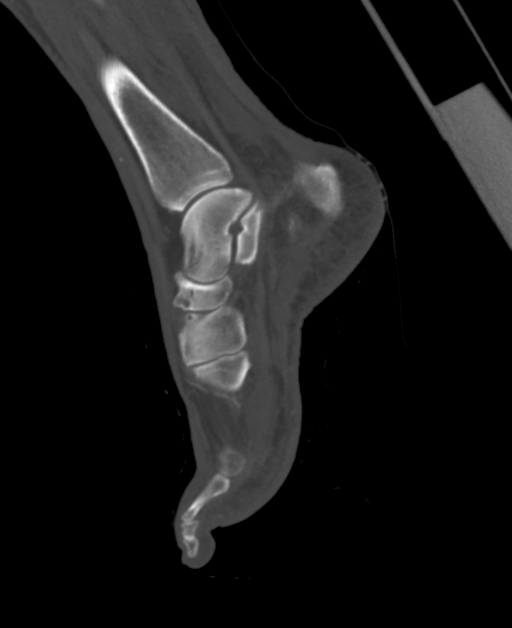

[Series 7: axial st · axial · 0.37mm/px · z∈[+42,+220]mm · 6 of 116 slices shown, 8 images]
[im 18/116  soft-tissue]
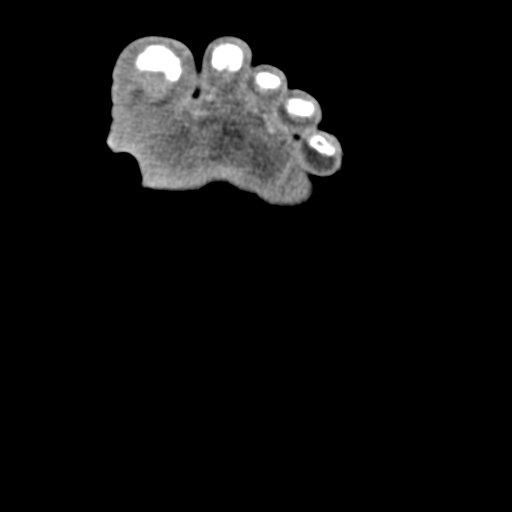
[im 18/116  bone]
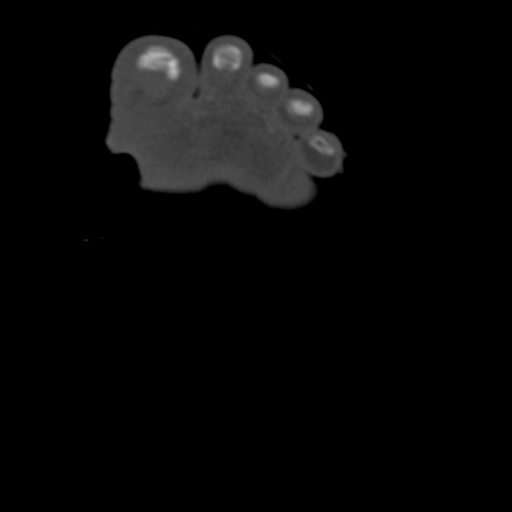
[im 36/116  bone]
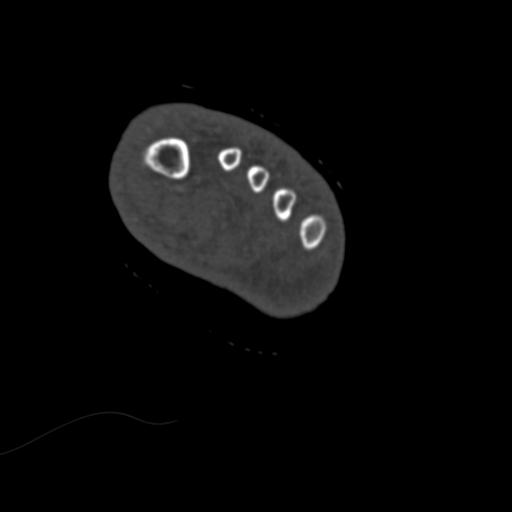
[im 54/116  bone]
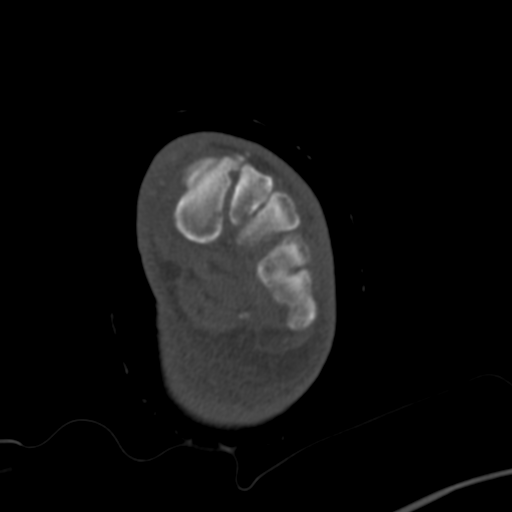
[im 71/116  bone]
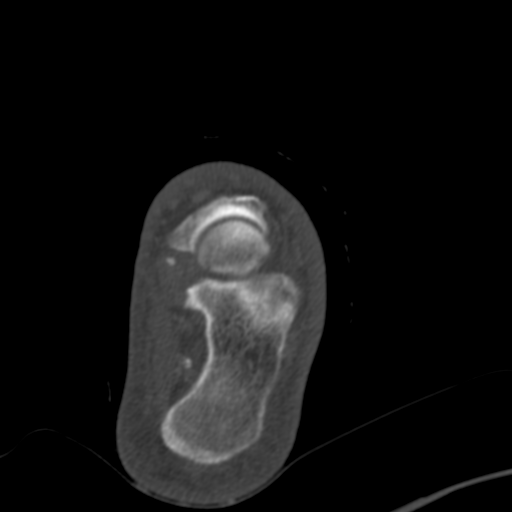
[im 89/116  soft-tissue]
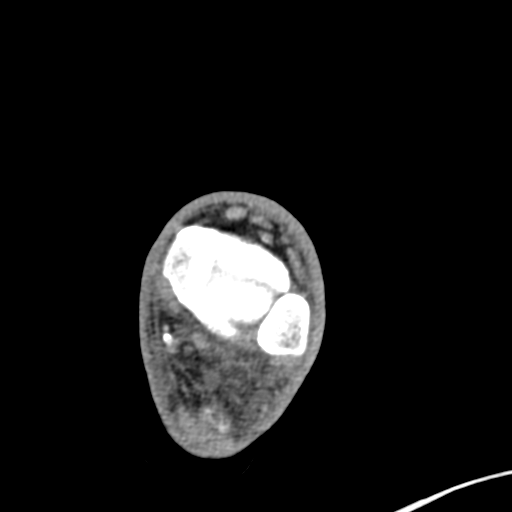
[im 89/116  bone]
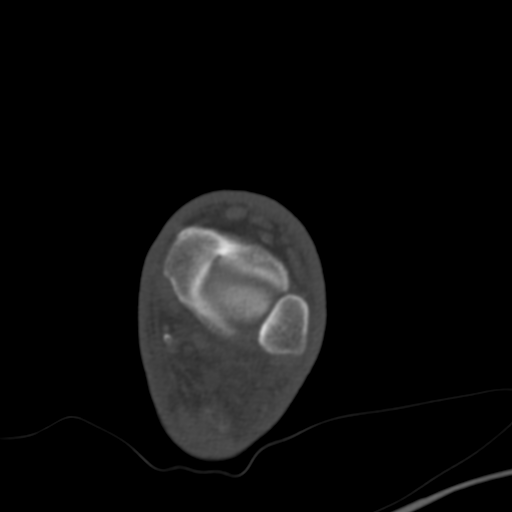
[im 107/116  bone]
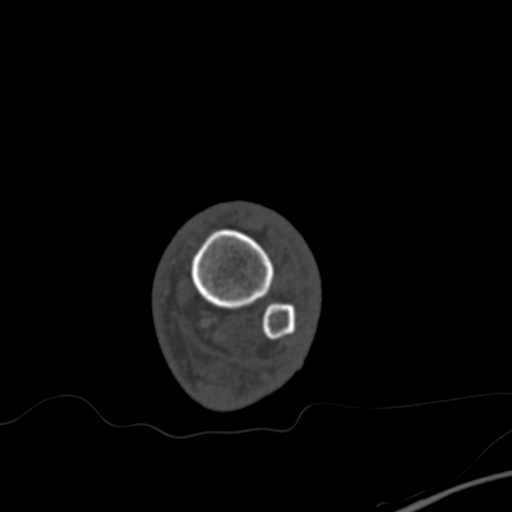

[14 of 35 positions shown; findings below may reference images not displayed]

FINDINGS: Bones/Joint/Cartilage

No cortical erosion or periosteal reaction. No evidence of fracture
or dislocation. Degenerative changes at the talonavicular and
calcaneonavicular joint space subchondral cystic changes and minimal
osteophytes. Subtalar and tibiotalar joints are intact.

Ligaments

Suboptimally assessed by CT.

Muscles and Tendons

Muscles are normal in bulk. No evidence of intramuscular fluid
collection or abscess.

Soft tissues

There is marked skin thickening and subcutaneous soft tissue edema
about the distal aspect of the foot. There is a deep skin wound
about the plantar aspect of the first metatarsophalangeal joint
without evidence of drainable fluid collection or abscess.
IMPRESSION: 1. Skin thickening and subcutaneous soft tissue edema about the
forefoot consistent with cellulitis.

2. Deep skin wound about the plantar aspect of the first
metatarsophalangeal joint. No cortical erosion or periosteal
reaction concerning for osteomyelitis. Evaluation of early
osteomyelitis is however limited on CT scans. If there is persistent
clinical concern MR examination could be obtained for further
evaluation.

## 2021-11-23 IMAGING — US US EXTREM LOW VENOUS
1 series · 13 of 24 positions shown · non-contrast
Comparison: None.

CLINICAL DATA: Bilateral lower extremity chronic pain and edema



[Series 1: us venous img lower bilat (dvt) · portal-venous · 13 of 74 slices shown]
[im 1/74]
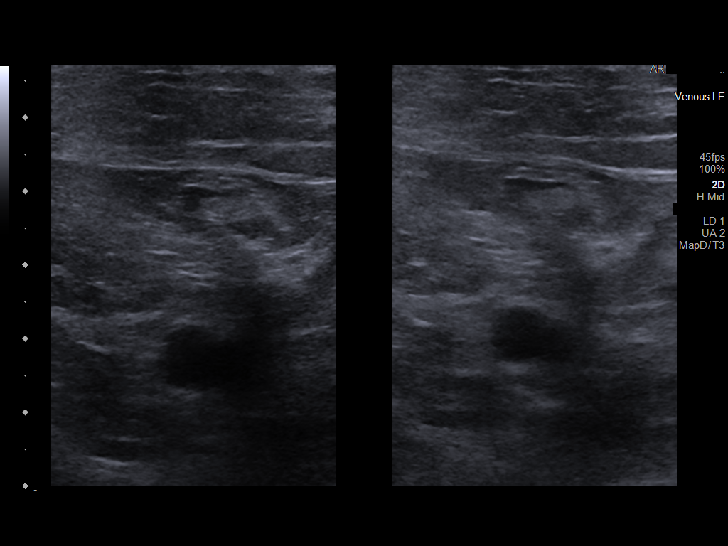
[im 7/74]
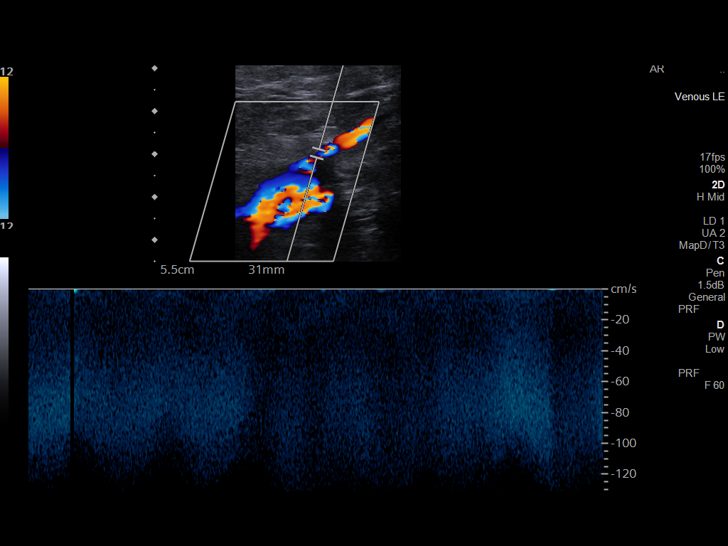
[im 13/74]
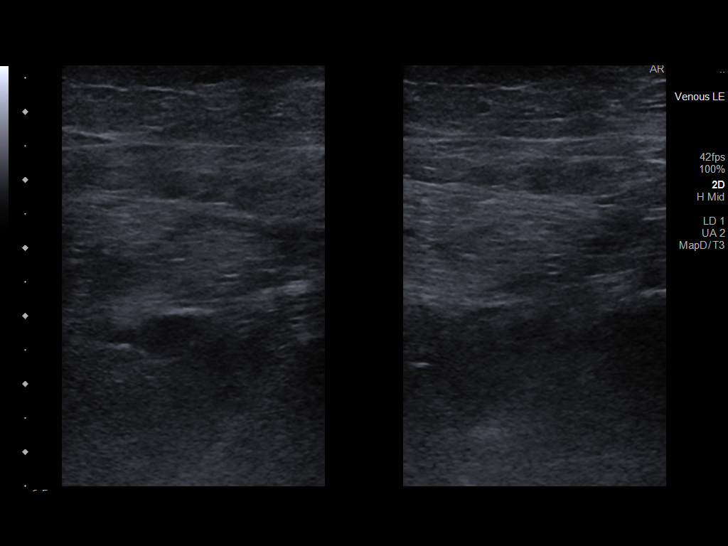
[im 20/74]
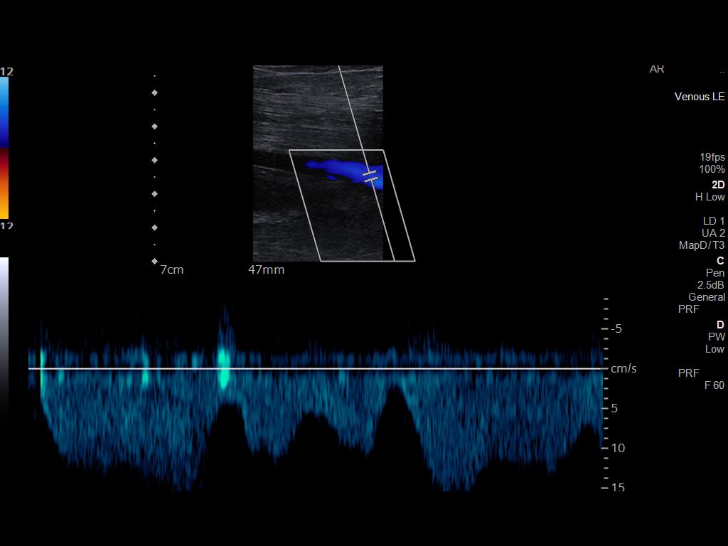
[im 26/74]
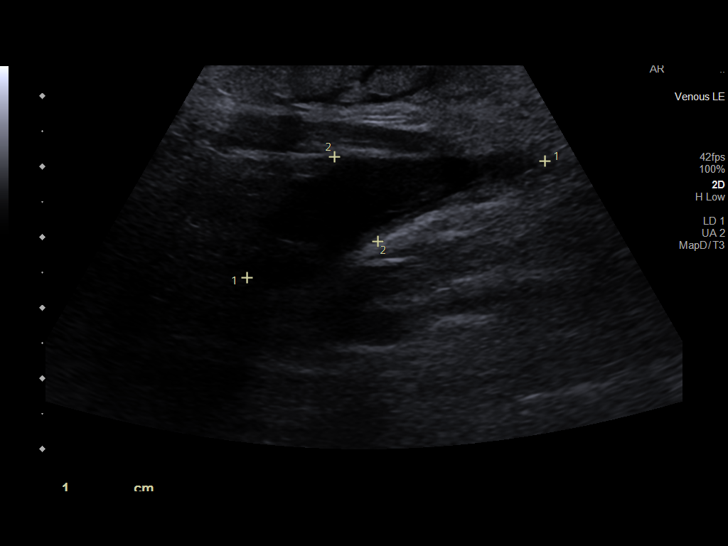
[im 32/74]
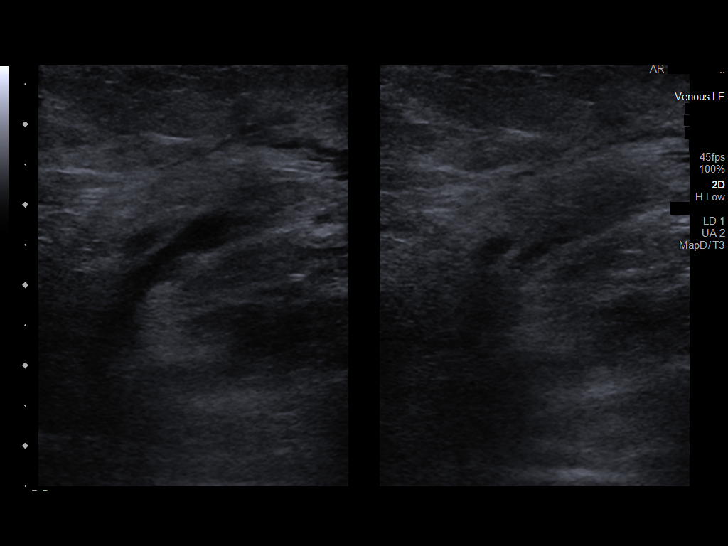
[im 39/74]
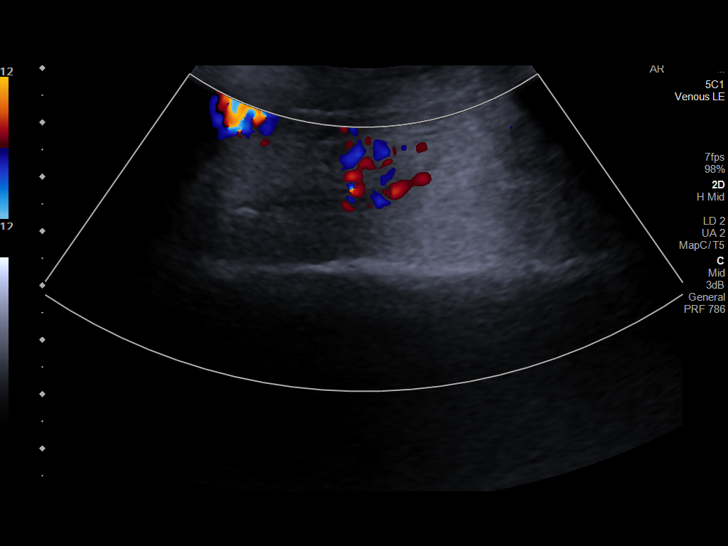
[im 42/74]
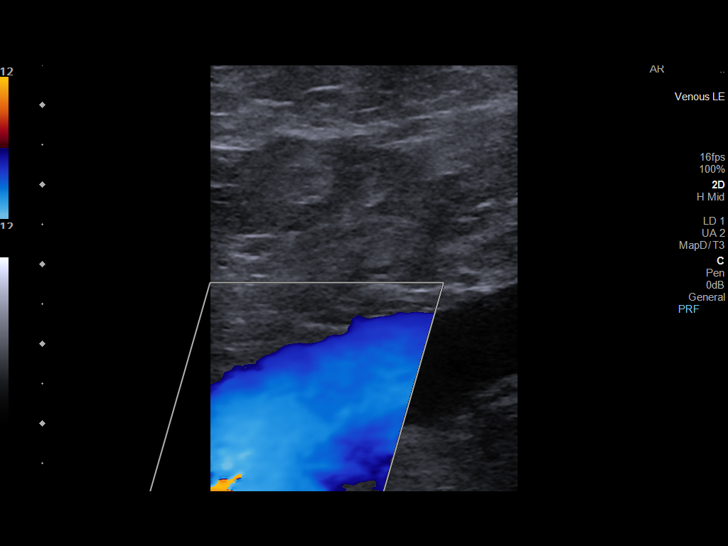
[im 48/74]
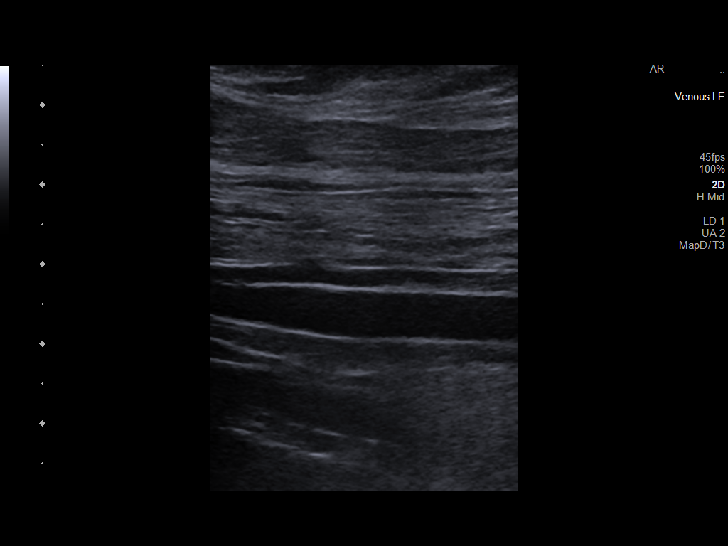
[im 54/74]
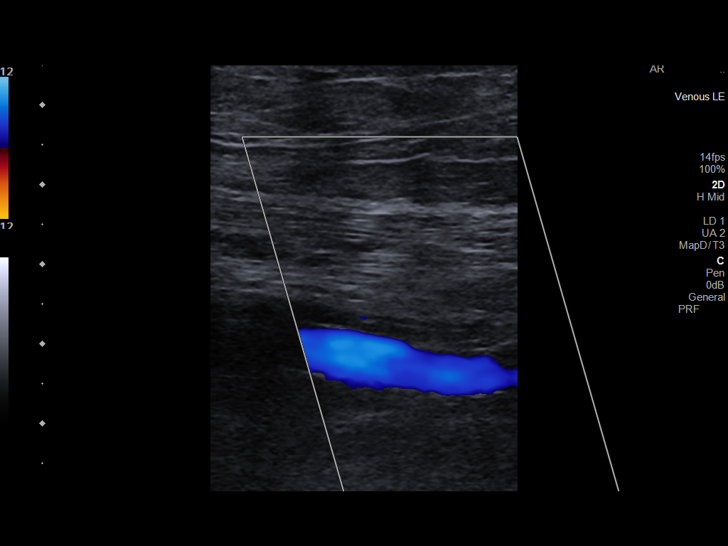
[im 61/74]
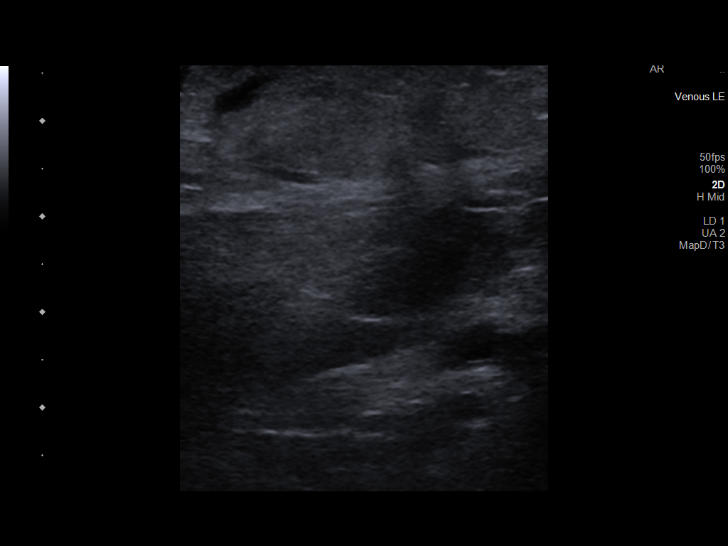
[im 67/74]
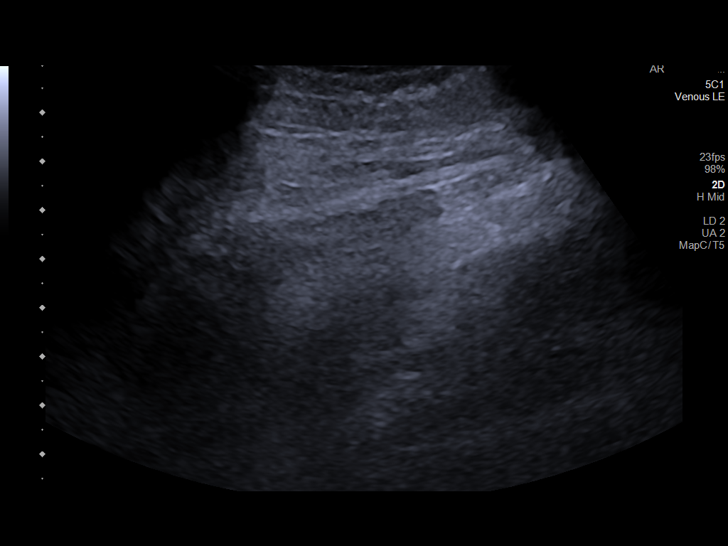
[im 74/74]
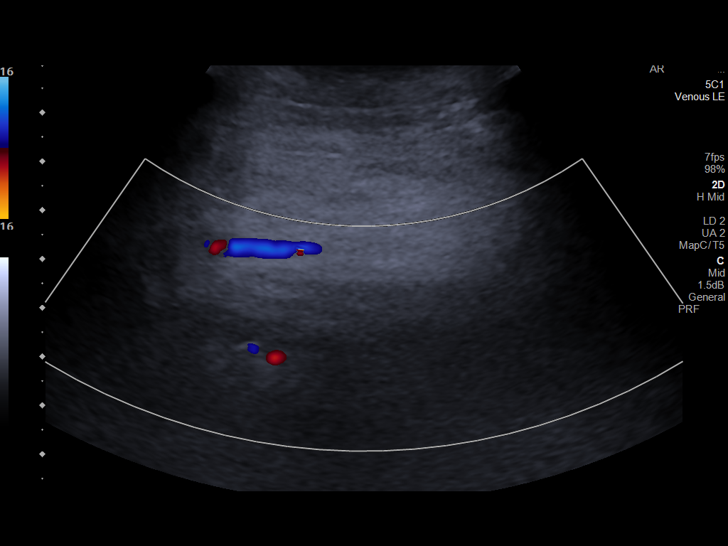

[13 of 24 positions shown; findings below may reference images not displayed]

FINDINGS: RIGHT LOWER EXTREMITY

Common Femoral Vein: No evidence of thrombus. Normal
compressibility, respiratory phasicity and response to augmentation.

Saphenofemoral Junction: No evidence of thrombus. Normal
compressibility and flow on color Doppler imaging.

Profunda Femoral Vein: No evidence of thrombus. Normal
compressibility and flow on color Doppler imaging.

Femoral Vein: No evidence of thrombus. Normal compressibility,
respiratory phasicity and response to augmentation.

Popliteal Vein: No evidence of thrombus. Normal compressibility,
respiratory phasicity and response to augmentation.

Calf Veins: No evidence of thrombus. Normal compressibility and flow
on color Doppler imaging.

Other Findings: Right popliteal fossa minimally complex thick-walled
Baker's cyst measures 4.5 x 1.4 x 2.9 cm

LEFT LOWER EXTREMITY

Common Femoral Vein: No evidence of thrombus. Normal
compressibility, respiratory phasicity and response to augmentation.

Saphenofemoral Junction: No evidence of thrombus. Normal
compressibility and flow on color Doppler imaging.

Profunda Femoral Vein: No evidence of thrombus. Normal
compressibility and flow on color Doppler imaging.

Femoral Vein: No evidence of thrombus. Normal compressibility,
respiratory phasicity and response to augmentation.

Popliteal Vein: No evidence of thrombus. Normal compressibility,
respiratory phasicity and response to augmentation.

Calf Veins: No evidence of thrombus. Normal compressibility and flow
on color Doppler imaging.
IMPRESSION: No significant DVT demonstrated in either extremity. Limited exam
because of obesity.

4.5 cm right knee Baker's cyst.

## 2021-11-23 MED ORDER — ENOXAPARIN SODIUM 80 MG/0.8ML IJ SOSY
65.0000 mg | PREFILLED_SYRINGE | INTRAMUSCULAR | Status: DC
  Administered 2021-11-24 – 2021-11-27 (×4): 65 mg via SUBCUTANEOUS
  Filled 2021-11-23 (×4): qty 0.8

## 2021-11-23 MED ORDER — LIVING WELL WITH DIABETES BOOK: Freq: Once | Status: AC

## 2021-11-23 MED ORDER — POTASSIUM CHLORIDE CRYS ER 20 MEQ PO TBCR
20.0000 meq | EXTENDED_RELEASE_TABLET | Freq: Once | ORAL | Status: AC
  Administered 2021-11-23: 16:00:00 20 meq via ORAL
  Filled 2021-11-23: qty 1

## 2021-11-23 MED ORDER — IOHEXOL 300 MG/ML  SOLN
100.0000 mL | Freq: Once | INTRAMUSCULAR | Status: AC | PRN
  Administered 2021-11-23: 14:00:00 75 mL via INTRAVENOUS

## 2021-11-23 MED ORDER — ZINC OXIDE 40 % EX OINT
TOPICAL_OINTMENT | Freq: Three times a day (TID) | CUTANEOUS | Status: DC
  Administered 2021-11-25: 1 via TOPICAL
  Filled 2021-11-23: qty 57

## 2021-11-23 NOTE — Progress Notes (Signed)
Attempted insertion of PICC in left upper arm using cephalic vein. However, unable to advance PICC past clavicle. Repositioned patient's arm multiple times with no success. Due to multiple open blisters on right upper arm, unable to attempt PICC insertion in right arm. Referring to IR for placement. Dr. Josephine Cables notified.

## 2021-11-23 NOTE — Consult Note (Signed)
Reason for Consult: Cellulitis, lower extremities Referring Physician: Dr. Dario [name] is a 60 y.o. female.  HPI: Patient is a 60 year old white female with multiple medical problems who presents to the emergency room with with lower extremity cellulitis, multiple skin lesions on the trunk and back.  Apparently she fell the night before but was unable to get up.  She complained of generalized weakness and bilateral feet pain.  Multiple medical issues were found and the patient was admitted to the hospital for further evaluation and treatment.  A wound care consult was obtained for her multiple wounds on her abdomen, legs, and feet.  Please see the media pictures.  I have been asked to see the patient for debridement of her left foot eschar.  Past Medical History:  Diagnosis Date   Asthma    COPD (chronic obstructive pulmonary disease) ([place])    Depression    Diabetes mellitus    Diastolic dysfunction 05/07/2296   Hypertension    Prolonged QT interval 05/14/2015    Past Surgical History:  Procedure Laterality Date   CATARACT EXTRACTION     CESAREAN SECTION     CHOLECYSTECTOMY      Family History  Problem Relation Age of Onset   Stroke Mother    Diabetes Father    Heart failure Father    Hypertension Father    Diabetes Sister    Heart failure Sister    Hypertension Sister    Stroke Sister    Cancer Other    Stroke Sister     Social History:  reports that she is a non-smoker but has been exposed to tobacco smoke. She has never used smokeless tobacco. She reports that she does not drink alcohol and does not use drugs.  Allergies:  Allergies  Allergen Reactions   Carvedilol Palpitations   Benicar [Olmesartan] Swelling   Codeine Other (See Comments)    Confusion    Sulfa Antibiotics Swelling    Whole face swells   Trulicity [Dulaglutide] Diarrhea    Medications: I have reviewed the patient's current medications.  Results for orders placed or performed during  the hospital encounter of 11/21/21 (from the past 48 hour(s))  Blood culture (routine x 2)     Status: None (Preliminary result)   Collection Time: 11/21/21 10:45 PM   Specimen: BLOOD RIGHT HAND  Result Value Ref Range   Specimen Description      BLOOD RIGHT HAND BOTTLES DRAWN AEROBIC AND ANAEROBIC   Special Requests Blood Culture adequate volume    Culture      NO GROWTH 2 DAYS Performed at [facility], [address] [place], [place] [address]    Report Status PENDING   Lactic acid, plasma     Status: Abnormal   Collection Time: 11/21/21 10:45 PM  Result Value Ref Range   Lactic Acid, Venous 2.0 (HH) 0.5 - 1.9 mmol/L    Comment: CRITICAL RESULT CALLED TO, READ BACK BY AND VERIFIED WITH: TURNER,C AT 2332 ON 12.17.22 BY RUCINSKI,B Performed at [facility], [address], [place], [place] [address]   Blood culture (routine x 2)     Status: None (Preliminary result)   Collection Time: 11/21/21 10:54 PM   Specimen: BLOOD LEFT HAND  Result Value Ref Range   Specimen Description      BLOOD LEFT HAND BOTTLES DRAWN AEROBIC AND ANAEROBIC   Special Requests Blood Culture adequate volume    Culture      NO GROWTH 2 DAYS  Performed at [facility], [address] [place], [place] [address]    Report Status PENDING   Resp Panel by RT-PCR (Flu A&B, Covid) Nasopharyngeal Swab     Status: None   Collection Time: 11/21/21 11:09 PM   Specimen: Nasopharyngeal Swab; Nasopharyngeal(NP) swabs in vial transport medium  Result Value Ref Range   SARS Coronavirus 2 by RT PCR NEGATIVE NEGATIVE    Comment: (NOTE) SARS-CoV-2 target nucleic acids are NOT DETECTED.  The SARS-CoV-2 RNA is generally detectable in upper respiratory specimens during the acute phase of infection. The lowest concentration of SARS-CoV-2 viral copies this assay can detect is 138 copies/mL. A negative result does not preclude SARS-Cov-2 infection and should not be used as the sole basis for treatment or other patient  management decisions. A negative result may occur with  improper specimen collection/handling, submission of specimen other than nasopharyngeal swab, presence of viral mutation(s) within the areas targeted by this assay, and inadequate number of viral copies(<138 copies/mL). A negative result must be combined with clinical observations, patient history, and epidemiological information. The expected result is Negative.  Fact Sheet for Patients:  EntrepreneurPulse.com.au  Fact Sheet for Healthcare Providers:  IncredibleEmployment.be  This test is no t yet approved or cleared by the [place] FDA and  has been authorized for detection and/or diagnosis of SARS-CoV-2 by FDA under an Emergency Use Authorization (EUA). This EUA will remain  in effect (meaning this test can be used) for the duration of the COVID-19 declaration under Section 564(b)(1) of the Act, 21 U.S.C.section 360bbb-3(b)(1), unless the authorization is terminated  or revoked sooner.       Influenza A by PCR NEGATIVE NEGATIVE   Influenza B by PCR NEGATIVE NEGATIVE    Comment: (NOTE) The Xpert Xpress SARS-CoV-2/FLU/RSV plus assay is intended as an aid in the diagnosis of influenza from Nasopharyngeal swab specimens and should not be used as a sole basis for treatment. Nasal washings and aspirates are unacceptable for Xpert Xpress SARS-CoV-2/FLU/RSV testing.  Fact Sheet for Patients: EntrepreneurPulse.com.au  Fact Sheet for Healthcare Providers: IncredibleEmployment.be  This test is not yet approved or cleared by the [place] FDA and has been authorized for detection and/or diagnosis of SARS-CoV-2 by FDA under an Emergency Use Authorization (EUA). This EUA will remain in effect (meaning this test can be used) for the duration of the COVID-19 declaration under Section 564(b)(1) of the Act, 21 U.S.C. section 360bbb-3(b)(1), unless the  authorization is terminated or revoked.  Performed at [facility], [address] [place], [place] [address]   Lactic acid, plasma     Status: None   Collection Time: 11/22/21 12:38 AM  Result Value Ref Range   Lactic Acid, Venous 1.8 0.5 - 1.9 mmol/L    Comment: Performed at [facility], [address] [place], [place] [address]  Glucose, capillary     Status: Abnormal   Collection Time: 11/22/21  1:06 AM  Result Value Ref Range   Glucose-Capillary 286 (H) 70 - 99 mg/dL    Comment: Glucose reference range applies only to samples taken after fasting for at least 8 hours.  Comprehensive metabolic panel     Status: Abnormal   Collection Time: 11/22/21  4:49 AM  Result Value Ref Range   Sodium 140 135 - 145 mmol/L   Potassium 3.9 3.5 - 5.1 mmol/L   Chloride 100 98 - 111 mmol/L   CO2 28 22 - 32 mmol/L   Glucose, Bld 293 (H) 70 - 99 mg/dL  Comment: Glucose reference range applies only to samples taken after fasting for at least 8 hours.   BUN 22 (H) 6 - 20 mg/dL   Creatinine, Ser 1.48 (H) 0.44 - 1.00 mg/dL   Calcium 8.4 (L) 8.9 - 10.3 mg/dL   Total Protein 6.3 (L) 6.5 - 8.1 g/dL   Albumin 2.5 (L) 3.5 - 5.0 g/dL   AST 286 (H) 15 - 41 U/L   ALT 94 (H) 0 - 44 U/L   Alkaline Phosphatase 115 38 - 126 U/L   Total Bilirubin 0.9 0.3 - 1.2 mg/dL   GFR, Estimated 40 (L) >60 mL/min    Comment: (NOTE) Calculated using the CKD-EPI Creatinine Equation (2021)    Anion gap 12 5 - 15    Comment: Performed at [facility], [address], [place], [place] [address]  CBC     Status: Abnormal   Collection Time: 11/22/21  4:49 AM  Result Value Ref Range   WBC 15.9 (H) 4.0 - 10.5 K/uL   RBC 4.76 3.87 - 5.11 MIL/uL   Hemoglobin 12.5 12.0 - 15.0 g/dL   HCT 39.1 36.0 - 46.0 %   MCV 82.1 80.0 - 100.0 fL   MCH 26.3 26.0 - 34.0 pg   MCHC 32.0 30.0 - 36.0 g/dL   RDW 15.3 11.5 - 15.5 %   Platelets 416 (H) 150 - 400 K/uL   nRBC 0.0 0.0 - 0.2 %    Comment: Performed at [facility], [address] [place], [place] [address]  APTT     Status: None   Collection Time: 11/22/21  4:49 AM  Result Value Ref Range   aPTT 31 24 - 36 seconds    Comment: Performed at [name], [address], [place], [place] [address]  Magnesium     Status: None   Collection Time: 11/22/21  4:49 AM  Result Value Ref Range   Magnesium 2.1 1.7 - 2.4 mg/dL    Comment: Performed at [facility], [address] [place], [place] [address]  Phosphorus     Status: Abnormal   Collection Time: 11/22/21  4:49 AM  Result Value Ref Range   Phosphorus 5.1 (H) 2.5 - 4.6 mg/dL    Comment: Performed at [facility], [address] [place], [place] [address]  Glucose, capillary     Status: Abnormal   Collection Time: 11/22/21  7:09 AM  Result Value Ref Range   Glucose-Capillary 289 (H) 70 - 99 mg/dL    Comment: Glucose reference range applies only to samples taken after fasting for at least 8 hours.  Sedimentation rate     Status: Abnormal   Collection Time: 11/22/21  8:43 AM  Result Value Ref Range   Sed Rate 82 (H) 0 - 22 mm/hr    Comment: Performed at [facility], [address] [place], [place] [address]  Glucose, capillary     Status: Abnormal   Collection Time: 11/22/21 10:59 AM  Result Value Ref Range   Glucose-Capillary 383 (H) 70 - 99 mg/dL    Comment: Glucose reference range applies only to samples taken after fasting for at least 8 hours.  Glucose, capillary     Status: Abnormal   Collection Time: 11/22/21  3:57 PM  Result Value Ref Range   Glucose-Capillary 436 (H) 70 - 99 mg/dL    Comment: Glucose reference range applies only to samples taken after fasting for at least 8 hours.  Glucose, capillary     Status: Abnormal  Collection Time: 11/22/21  9:16 PM  Result Value Ref Range   Glucose-Capillary 220 (H) 70 - 99 mg/dL    Comment: Glucose reference range applies only to samples taken after fasting for at least 8 hours.   Comment 1 Notify RN    Comment 2 Document in Chart   Comprehensive  metabolic panel     Status: Abnormal   Collection Time: 11/23/21  4:11 AM  Result Value Ref Range   Sodium 138 135 - 145 mmol/L   Potassium 3.4 (L) 3.5 - 5.1 mmol/L   Chloride 100 98 - 111 mmol/L   CO2 26 22 - 32 mmol/L   Glucose, Bld 145 (H) 70 - 99 mg/dL    Comment: Glucose reference range applies only to samples taken after fasting for at least 8 hours.   BUN 25 (H) 6 - 20 mg/dL   Creatinine, Ser 1.54 (H) 0.44 - 1.00 mg/dL   Calcium 8.4 (L) 8.9 - 10.3 mg/dL   Total Protein 6.3 (L) 6.5 - 8.1 g/dL   Albumin 2.4 (L) 3.5 - 5.0 g/dL   AST 258 (H) 15 - 41 U/L   ALT 106 (H) 0 - 44 U/L   Alkaline Phosphatase 113 38 - 126 U/L   Total Bilirubin 0.5 0.3 - 1.2 mg/dL   GFR, Estimated 38 (L) >60 mL/min    Comment: (NOTE) Calculated using the CKD-EPI Creatinine Equation (2021)    Anion gap 12 5 - 15    Comment: Performed at [facility], [address] [place], [place] [address]  CBC     Status: Abnormal   Collection Time: 11/23/21  4:11 AM  Result Value Ref Range   WBC 12.5 (H) 4.0 - 10.5 K/uL   RBC 4.50 3.87 - 5.11 MIL/uL   Hemoglobin 11.8 (L) 12.0 - 15.0 g/dL   HCT 36.2 36.0 - 46.0 %   MCV 80.4 80.0 - 100.0 fL   MCH 26.2 26.0 - 34.0 pg   MCHC 32.6 30.0 - 36.0 g/dL   RDW 15.3 11.5 - 15.5 %   Platelets 396 150 - 400 K/uL   nRBC 0.0 0.0 - 0.2 %    Comment: Performed at [facility], [address], [place], [place] [address]  C-reactive protein     Status: Abnormal   Collection Time: 11/23/21  4:12 AM  Result Value Ref Range   CRP 21.5 (H) <1.0 mg/dL    Comment: Performed at [facility], [place] [address] [place], [place] [address]  Glucose, capillary     Status: Abnormal   Collection Time: 11/23/21  7:04 AM  Result Value Ref Range   Glucose-Capillary 158 (H) 70 - 99 mg/dL    Comment: Glucose reference range applies only to samples taken after fasting for at least 8 hours.  Glucose, capillary     Status: Abnormal   Collection Time: 11/23/21 10:55 AM  Result Value Ref Range    Glucose-Capillary 212 (H) 70 - 99 mg/dL    Comment: Glucose reference range applies only to samples taken after fasting for at least 8 hours.  Glucose, capillary     Status: Abnormal   Collection Time: 11/23/21  4:04 PM  Result Value Ref Range   Glucose-Capillary 271 (H) 70 - 99 mg/dL    Comment: Glucose reference range applies only to samples taken after fasting for at least 8 hours.    CT ABDOMEN PELVIS WO CONTRAST  Result Date: 11/21/2021 CLINICAL DATA:  Left lower quadrant abdominal  pain. EXAM: CT ABDOMEN AND PELVIS WITHOUT CONTRAST TECHNIQUE: Multidetector CT imaging of the abdomen and pelvis was performed following the standard protocol without IV contrast. COMPARISON:  CT abdomen and pelvis 09/24/2017. FINDINGS: Lower chest: No acute abnormality. Hepatobiliary: No focal liver abnormality is seen. Status post cholecystectomy. Pancreas: Atrophic. Spleen: Mildly enlarged, unchanged. Adrenals/Urinary Tract: There is a 16 mm cyst in the right kidney. Otherwise, the adrenal glands, kidneys and bladder are within normal limits. Stomach/Bowel: Stomach is within normal limits. There is distal esophageal wall thickening. Appendix appears normal. No evidence of bowel wall thickening, distention, or inflammatory changes. Vascular/Lymphatic: Aortic atherosclerosis. No enlarged abdominal or pelvic lymph nodes. Reproductive: Uterus and bilateral adnexa are unremarkable. Other: There is no ascites or focal abdominal wall hernia. There is mild body wall edema. There is skin thickening measuring up to 16 mm in the left anterior abdominal wall. No focal fluid collection or soft tissue gas. Musculoskeletal: Multilevel degenerative changes affect the spine IMPRESSION: 1. Skin thickening in the left anterior abdominal wall. Correlate clinically for infection. No evidence for abscess or soft tissue gas. 2. Mild splenomegaly. 3. Wall thickening of the distal esophagus concerning for esophagitis. Recommend clinical  correlation follow-up to exclude underlying lesion. 4.  Aortic Atherosclerosis (ICD10-I70.0). Electronically Signed   By: Ronney Asters M.D.   On: 11/21/2021 21:18   CT FOOT LEFT W CONTRAST  Result Date: 11/23/2021 CLINICAL DATA:  Foot swelling, diabetic osteomyelitis suspected. EXAM: CT OF THE LOWER LEFT EXTREMITY WITH CONTRAST TECHNIQUE: Multidetector CT imaging of the lower left extremity was performed according to the standard protocol following intravenous contrast administration. CONTRAST:  67mL OMNIPAQUE IOHEXOL 300 MG/ML  SOLN COMPARISON:  None. FINDINGS: Bones/Joint/Cartilage No cortical erosion or periosteal reaction. No evidence of fracture or dislocation. Degenerative changes at the talonavicular and calcaneonavicular joint space subchondral cystic changes and minimal osteophytes. Subtalar and tibiotalar joints are intact. Ligaments Suboptimally assessed by CT. Muscles and Tendons Muscles are normal in bulk. No evidence of intramuscular fluid collection or abscess. Soft tissues There is marked skin thickening and subcutaneous soft tissue edema about the distal aspect of the foot. There is a deep skin wound about the plantar aspect of the first metatarsophalangeal joint without evidence of drainable fluid collection or abscess. IMPRESSION: 1. Skin thickening and subcutaneous soft tissue edema about the forefoot consistent with cellulitis. 2. Deep skin wound about the plantar aspect of the first metatarsophalangeal joint. No cortical erosion or periosteal reaction concerning for osteomyelitis. Evaluation of early osteomyelitis is however limited on CT scans. If there is persistent clinical concern MR examination could be obtained for further evaluation. Electronically Signed   By: Keane Police D.O.   On: 11/23/2021 16:30   CT TIBIA FIBULA RIGHT W CONTRAST  Result Date: 11/23/2021 CLINICAL DATA:  Leg swelling and sores. EXAM: CT OF THE LOWER RIGHT EXTREMITY WITH CONTRAST TECHNIQUE: Multidetector  CT imaging of the lower right extremity was performed according to the standard protocol following intravenous contrast administration. CONTRAST:  18mL OMNIPAQUE IOHEXOL 300 MG/ML  SOLN COMPARISON:  75 cc Omnipaque 300 FINDINGS: Bones/Joint/Cartilage Knee osteoarthritis especially affecting the patellofemoral compartment. No bony destructive findings in the tibial or fibular characteristic of osteomyelitis. Small knee effusion without a substantial degree of synovitis. Posterior meniscal chondrocalcinosis versus free osteochondral fragment along the posterior horn medial meniscus. Plantar and Achilles calcaneal spurs. Accessory navicular noted. Small Baker's cyst noted. Ligaments Suboptimally assessed by CT. Muscles and Tendons No intramuscular abscess or hematoma. Soft tissues Subcutaneous edema at the  level of the knee is primarily anterior and lateral. This tracks distally in the calf and is more confluent in the distal calf and in the ankle region where the appearance is more circumferential. The cause of this subcutaneous edema is nonspecific. No subcutaneous abscess is present. There are scattered irregular subcutaneous calcifications compatible with venous insufficiency. Atherosclerotic calcification of arterial vascular structures also noted. Today's exam is not a CT angiogram and does not adequately assess patency of the arterial vessels distal to the popliteal artery. I do not see a definite large cutaneous ulceration or gas tracking in the subcutaneous tissues. IMPRESSION: 1. No findings of osteomyelitis or drainable abscess. 2. Subcutaneous edema especially in the distal calf and ankle region. Cellulitis not excluded. No gas in the soft tissues. 3. Subcutaneous calcifications along the calf compatible with chronic venous insufficiency. 4. Small knee effusion and small Baker's cyst. No substantial synovitis. 5. Posterior horn medial meniscus chondrocalcinosis versus small adjacent free osteochondral  fragment. 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 6. Plantar and Achilles calcaneal spurs. Electronically Signed   By: Van Clines M.D.   On: 11/23/2021 16:54   MR FOOT RIGHT WO CONTRAST  Result Date: 11/23/2021 CLINICAL DATA:  Foot swelling, diabetic, osteomyelitis suspected. No prior imaging. EXAM: MRI OF THE RIGHT FOREFOOT WITHOUT CONTRAST TECHNIQUE: Multiplanar, multisequence MR imaging of the right was performed. No intravenous contrast was administered. COMPARISON:  Radiographs dated November 21, 2021. FINDINGS: Bones/Joint/Cartilage No fracture or dislocation. Normal alignment. No joint effusion. No marrow signal abnormality. Ligaments Collateral ligaments are intact.  Lisfranc ligament is intact. Muscles and Tendons Flexor, peroneal and extensor compartment tendons are intact. 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 No drainable fluid collection or abscess. Soft tissue Skin thickening and prominent subcutaneous soft tissue edema about the dorsal and plantar aspect of the forefoot. No drainable fluid collection or abscess. No soft tissue mass. IMPRESSION: 1.  No MR evidence of acute osteomyelitis. 2. Skin thickening and subcutaneous soft tissue edema consistent with cellulitis. No drainable fluid collection or abscess. Electronically Signed   By: Keane Police D.O.   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 EXAM: MRI OF THE LEFT FOOT WITHOUT CONTRAST TECHNIQUE: Multiplanar, multisequence MR imaging of the left was performed. No intravenous contrast was administered. COMPARISON:  Radiographs dated November 21, 2021 FINDINGS: Nondiagnostic examination due to significant motion. Subcutaneous soft tissue edema. IMPRESSION: Nondiagnostic examination due to motion. Subcutaneous soft tissue edema. Electronically  Signed   By: Keane Police D.O.   On: 11/23/2021 11:37   US Abdomen Limited  Result Date: 11/22/2021 CLINICAL DATA:  Elevated liver enzymes. Gallbladder surgically absent. EXAM: ULTRASOUND ABDOMEN LIMITED RIGHT UPPER QUADRANT COMPARISON:  CT abdomen and pelvis, 11/21/2021. FINDINGS: Gallbladder: Surgically absent. Common bile duct: Diameter: 4 mm Liver: Suboptimally visualized. Normal overall size. No convincing mass. Portal vein is patent on color Doppler imaging with normal direction of blood flow towards the liver. Other: None. IMPRESSION: 1. Significantly limited study due to the patient's body habitus. 2. No acute findings. No bile duct dilation. Liver not well assessed, better visualized on the previous day's CT. Electronically Signed   By: Lajean Manes M.D.   On: 11/22/2021 09:01   US Venous Img Lower Bilateral (DVT)  Result Date: 11/23/2021 CLINICAL DATA:  Bilateral lower extremity chronic pain and edema EXAM: BILATERAL LOWER EXTREMITY VENOUS DOPPLER ULTRASOUND TECHNIQUE: Gray-scale  sonography with graded compression, as well as color Doppler and duplex ultrasound were performed to evaluate the lower extremity deep venous systems from the level of the common femoral vein and including the common femoral, femoral, profunda femoral, popliteal and calf veins including the posterior tibial, peroneal and gastrocnemius veins when visible. The superficial great saphenous vein was also interrogated. Spectral Doppler was utilized to evaluate flow at rest and with distal augmentation maneuvers in the common femoral, femoral and popliteal veins. COMPARISON:  None. FINDINGS: RIGHT LOWER EXTREMITY Common Femoral Vein: No evidence of thrombus. Normal compressibility, respiratory phasicity and response to augmentation. Saphenofemoral Junction: No evidence of thrombus. Normal compressibility and flow on color Doppler imaging. Profunda Femoral Vein: No evidence of thrombus. Normal compressibility and flow on color  Doppler imaging. Femoral Vein: No evidence of thrombus. Normal compressibility, respiratory phasicity and response to augmentation. Popliteal Vein: No evidence of thrombus. Normal compressibility, respiratory phasicity and response to augmentation. Calf Veins: No evidence of thrombus. Normal compressibility and flow on color Doppler imaging. Other Findings: Right popliteal fossa minimally complex thick-walled Baker's cyst measures 4.5 x 1.4 x 2.9 cm LEFT LOWER EXTREMITY Common Femoral Vein: No evidence of thrombus. Normal compressibility, respiratory phasicity and response to augmentation. Saphenofemoral Junction: No evidence of thrombus. Normal compressibility and flow on color Doppler imaging. Profunda Femoral Vein: No evidence of thrombus. Normal compressibility and flow on color Doppler imaging. Femoral Vein: No evidence of thrombus. Normal compressibility, respiratory phasicity and response to augmentation. Popliteal Vein: No evidence of thrombus. Normal compressibility, respiratory phasicity and response to augmentation. Calf Veins: No evidence of thrombus. Normal compressibility and flow on color Doppler imaging. IMPRESSION: No significant DVT demonstrated in either extremity. Limited exam because of obesity. 4.5 cm right knee Baker's cyst. Electronically Signed   By: Jerilynn Mages.  Shick M.D.   On: 11/23/2021 14:10   US ARTERIAL ABI (SCREENING LOWER EXTREMITY)  Result Date: 11/23/2021 CLINICAL DATA:  60 year old female with a history bilateral wounds EXAM: NONINVASIVE PHYSIOLOGIC VASCULAR STUDY OF BILATERAL LOWER EXTREMITIES TECHNIQUE: Evaluation of both lower extremities was performed at rest, including calculation of ankle-brachial indices, multiple segmental pressure evaluation, segmental Doppler and segmental pulse volume recording. COMPARISON:  None. FINDINGS: Right ABI:  0.96 Left ABI:  1.35 Right Lower Extremity: Segmental Doppler at the right ankle demonstrates monophasic waveforms Left Lower Extremity:  Segmental Doppler at the left ankle demonstrates monophasic waveforms IMPRESSION: Resting ABI the bilateral lower extremity within normal limits, though could be falsely elevated given the segmental exam. Segmental Doppler at the bilateral ankles demonstrates monophasic waveforms. Signed, Dulcy Fanny. Dellia Nims, RPVI [facility] Electronically Signed   By: Corrie Mckusick D.O.   On: 11/23/2021 11:21   DG Foot Complete Left  Result Date: 11/21/2021 CLINICAL DATA:  Wounds to both feet x2 weeks. EXAM: LEFT FOOT - COMPLETE 3+ VIEW COMPARISON:  November 06, 2021 FINDINGS: There is no evidence of fracture or dislocation. There is no evidence of arthropathy or other focal bone abnormality. A 1.2 cm superficial soft tissue ulceration is seen along the plantar aspect of the distal left foot. Moderate severity soft tissue swelling is also seen within this region. IMPRESSION: Plantar soft tissue ulceration and associated soft tissue swelling, as described above, without an acute osseous abnormality. Electronically Signed   By: Virgina Norfolk M.D.   On: 11/21/2021 21:33   DG Foot Complete Right  Result Date: 11/21/2021 CLINICAL DATA:  Wounds to both feet x2 weeks. EXAM: RIGHT FOOT COMPLETE -  3+ VIEW COMPARISON:  November 06, 2021 FINDINGS: There is no evidence of fracture or dislocation. There is no evidence of arthropathy or other focal bone abnormality. Mild to moderate severity vascular calcification is seen. Mild soft tissue swelling is seen along the plantar aspect of the distal right foot. IMPRESSION: Plantar soft tissue swelling, as described above, without an acute osseous abnormality. Electronically Signed   By: Virgina Norfolk M.D.   On: 11/21/2021 21:31   [place] EKG SITE RITE  Result Date: 11/23/2021 If Site Rite image not attached, placement could not be confirmed due to current cardiac rhythm.   ROS:  Pertinent items are noted in HPI.  Blood  pressure (!) 171/77, pulse 79, temperature 97.7 F (36.5 C), temperature source Oral, resp. rate 18, height 5\' 1"  (1.549 m), weight 133 kg, last menstrual period 08/06/2012, SpO2 94 %. Physical Exam: Pleasant white female no acute distress. Extremity examination reveals multiple superficial skin lesions with erythema.  She has bilateral eschars and plantar ulcerations present over the first metatarsal heads.  Please see wound care note for descriptions.  I did debride the callus on the plantar surface of the left first metatarsal ulceration.  No purulent fluid was present.  I was able to debride down to the soft tissue using scissors.  This was an excisional debridement.  She tolerated the procedure well.  She did not have any sensation in that area.  Assessment/Plan: Impression: Diabetic foot ulcerations with cellulitis.  Patient is severely deconditioned and has multiple comorbidities that require attention.  I agree with skilled nursing unit care as there is evidence that she is not able to care for herself.  Wound care orders have already been written.  We will follow peripherally with you.  Patient does not need bilateral lower extremity amputations at the present time.  [name] 11/23/2021, 5:23 PM

## 2021-11-23 NOTE — Progress Notes (Signed)
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

## 2021-11-23 NOTE — Progress Notes (Signed)
PROGRESS NOTE  [name] ZES:[id number] DOB: [birth year] DOA: 11/21/2021 PCP: Bridget Hartshorn, NP   Brief History:  60 year old female with a history of OSA, diabetes mellitus type 2, hypertension, depression, morbid obesity presenting with generalized weakness with associated nausea and vomiting for the past 2 days.  Unfortunately, the patient is a difficult historian at best.  In addition, I also try to contact the patient's spouse at home who is also a difficult historian at best. As best as I was able to decipher, it appears that the patient has had generalized weakness, dizziness, and nausea for the better part of the least the past 2 weeks.  It appears that for the past 5-6 days her symptoms have worsened to the point where she has been essentially unable to get out of bed.  Her spouse states that he has had to help her get out of bed, but he is having difficulty due to his own medical issues.  Occasionally, the patient has been soiling herself in bed.  Prior to her acute illness, the patient was able to make transfers and walk with a walker, but her functional status has significantly declined over the past 2 weeks.  The patient endorses nausea and vomiting for the past 2 days prior to admission.  She has had some loose stools but denies any frank diarrhea, hematochezia, melena.  She denies any chest pain, shortness breath, coughing, hemoptysis.  There is been no fevers, chills, headache, neck pain.   Apparently, the patient visited the emergency department on 11/06/2021 for lower extremity pain.  She was diagnosed with cellulitis and sent home with doxycycline which she has taken.  She states that she chronically has lower extremity pain for which she takes gabapentin.  She is not really able to clarify whether she has had any worsening drainage, erythema, or edema in her bilateral legs.  Unfortunately her husband cannot clarify the situation any further.  When asked about  previous wound care, the patient and spouse do not remember have any chronic wound care, but review of the medical record shows that the patient has been to the wound care center to see Dr. Dellia Nims back in November 2021.   Lastly, the patient and spouse seem to have some poor insight regarding her the patient's insulin usage.  Spouse has to assist the patient with her insulin, but he also states that the patient takes her medications independently.  There is much conflicting information regarding exactly how much insulin the patient is taking.  However review of the medical record shows that she had a recent PCP visit for which was documented that she was taking 70/30 insulin 100 units twice daily.  The patient currently states that she takes 70/3060 units twice daily with Humalog R in the mornings and with supper.  He is not really able to clarify exactly how much Humalog she takes.   In the emergency department, the patient was afebrile hemodynamically stable with oxygen saturation 99% room air.  BMP showed sodium 141, potassium 4.0, bicarbonate 32, serum creatinine 1.27.  AST is 177, ALT 55, alkaline phosphatase 118, total bilirubin 1.1.  WBC 18.8, hemoglobin 14.5, platelets 450,000.  Lactic acid 2.0.  The patient was started on vancomycin.  She was given a 1 L bolus of normal saline.     Assessment/Plan: Diabetic foot infection -Continue vancomycin and ceftriaxone -Check ABIs>>Right ABI:  0.96;  Left ABI:  1.35 -Wound care  consult for numerous lower extremity wounds -ESR -CRP--21.5 -Personally reviewed x-rays of the foot--no erosions noted -MR Right Foot>>no osteomyelitis, no abscess -MR LEFT Foot>>limited by movement -try CT of LEFT FOOT -general surgery consult   Uncontrolled diabetes mellitus type 2 with hyperglycemia -The patient had anion gap 12 with ketonuria but normal bicarbonate -Continue IV fluids -Increase dSemglee to 20 units -Resistant sliding scale -12/17  A1c--9.9    Abdominal wall cellulitis/Cellulitis leg -Antibiotics as discussed above -Wound care consult appreciated -CT right tib/fib   Transaminasemia -Holding statin -11/21/2021 CT abd--skin thickening left anterior abdominal wall without abscess.  Mild splenomegaly.  Atrophic pancreas.  Status postcholecystectomy.  No bowel wall thickening. -Viral hepatitis panel negative -Urine drug screen -EBV DNA -CMV DNA   Acute kidney injury -Baseline creatinine 0.8-1.1 -Serum creatinine peaked at 1.54   Pyuria -UA 11-20 WBC -urine culture was not obtained   Acute metabolic encephalopathy -The patient remains confused but improving -Secondary to infectious process -Further work-up if no improvement   Lactic acidosis -Secondary to volume depletion -Start IV fluids   GERD -continue PPI   Morbid Obesity -BMI 55.40 -lifestyle modification  Hypokalemia -replete -check mag                 Family Communication:  spouse updated 12/19   Consultants:  none   Code Status:  FULL    DVT Prophylaxis:  [place] Lovenox     Procedures: As Listed in Progress Note Above   Antibiotics: Vanc 12/17>> Ceftriaxone 12/18>>     Subjective: Patient complains of numbness bilateral feet.  Denies f/c, cp, sob.  She has nausea.  No vomiting, diarrhea, no abd pain  Objective: Vitals:   11/22/21 1310 11/22/21 2115 11/23/21 0503 11/23/21 1230  BP: (!) 126/36 (!) 140/54 (!) 159/78 (!) 171/77  Pulse: 82 83 82 79  Resp: _0 Temp: 98 F (36.7 C) 98.6 F (37 C) (!) 97.4 F (36.3 C) 97.7 F (36.5 C)  TempSrc: Oral Oral  Oral  SpO2: 98% 95% 93% 94%  Weight:      Height:        Intake/Output Summary (Last 24 hours) at 11/23/2021 1251 Last data filed at 11/23/2021 1215 Gross per 24 hour  Intake 3614.45 ml  Output --  Net 3614.45 ml   Weight change:  Exam:  General:  Pt is alert, follows commands appropriately, not in acute distress HEENT: No icterus, No thrush, No neck mass,  [place]/AT Cardiovascular: RRR, S1/S2, no rubs, no gallops Respiratory: CTA bilaterally, no wheezing, no crackles, no rhonchi Abdomen: Soft/+BS, non tender, non distended, no guarding Extremities: left first plantar MTPJ area with ulcer with malodorous drainage;  erythema of right calf without necrosis or crepitance   Data Reviewed: I have personally reviewed following labs and imaging studies Basic Metabolic Panel: Recent Labs  Lab 11/21/21 1634 11/22/21 0449 11/23/21 0411  NA 141 140 138  K 4.0 3.9 3.4*  CL 97* 100 100  CO2 32 28 26  GLUCOSE 254* 293* 145*  BUN 18 22* 25*  CREATININE 1.27* 1.48* 1.54*  CALCIUM 9.0 8.4* 8.4*  MG 2.0 2.1  --   PHOS  --  5.1*  --    Liver Function Tests: Recent Labs  Lab 11/21/21 1634 11/22/21 0449 11/23/21 0411  AST 177* 286* 258*  ALT 55* 94* 106*  ALKPHOS 118 115 113  BILITOT 1.1 0.9 0.5  PROT 7.0 6.3* 6.3*  ALBUMIN 2.6* 2.5* 2.4*   Recent Labs  Lab 11/21/21 1634  LIPASE 18   No results for input(s): AMMONIA in the last 168 hours. Coagulation Profile: No results for input(s): INR, PROTIME in the last 168 hours. CBC: Recent Labs  Lab 11/21/21 1634 11/22/21 0449 11/23/21 0411  WBC 18.8* 15.9* 12.5*  NEUTROABS 16.7*  --   --   HGB 14.5 12.5 11.8*  HCT 45.3 39.1 36.2  MCV 80.9 82.1 80.4  PLT 458* 416* 396   Cardiac Enzymes: No results for input(s): CKTOTAL, CKMB, CKMBINDEX, TROPONINI in the last 168 hours. BNP: Invalid input(s): POCBNP CBG: Recent Labs  Lab 11/22/21 1059 11/22/21 1557 11/22/21 2116 11/23/21 0704 11/23/21 1055  GLUCAP 383* 436* 220* 158* 212*   HbA1C: Recent Labs    11/21/21 1634  HGBA1C 9.9*   Urine analysis:    Component Value Date/Time   COLORURINE AMBER (A) 11/21/2021 1626   APPEARANCEUR CLOUDY (A) 11/21/2021 1626   LABSPEC 1.016 11/21/2021 1626   PHURINE 5.0 11/21/2021 1626   GLUCOSEU 50 (A) 11/21/2021 1626   HGBUR LARGE (A) 11/21/2021 1626   BILIRUBINUR NEGATIVE 11/21/2021 1626    KETONESUR 5 (A) 11/21/2021 1626   PROTEINUR >=300 (A) 11/21/2021 1626   UROBILINOGEN 0.2 05/15/2015 0112   NITRITE NEGATIVE 11/21/2021 1626   LEUKOCYTESUR NEGATIVE 11/21/2021 1626   Sepsis Labs: _0 (procalcitonin:4,lacticidven:4) ) Recent Results (from the past 240 hour(s))  Blood culture (routine x 2)     Status: None (Preliminary result)   Collection Time: 11/21/21 10:45 PM   Specimen: BLOOD RIGHT HAND  Result Value Ref Range Status   Specimen Description   Final    BLOOD RIGHT HAND BOTTLES DRAWN AEROBIC AND ANAEROBIC   Special Requests Blood Culture adequate volume  Final   Culture   Final    NO GROWTH 2 DAYS Performed at [facility], [address] [place], [place] [address]    Report Status PENDING  Incomplete  Blood culture (routine x 2)     Status: None (Preliminary result)   Collection Time: 11/21/21 10:54 PM   Specimen: BLOOD LEFT HAND  Result Value Ref Range Status   Specimen Description   Final    BLOOD LEFT HAND BOTTLES DRAWN AEROBIC AND ANAEROBIC   Special Requests Blood Culture adequate volume  Final   Culture   Final    NO GROWTH 2 DAYS Performed at [facility], [address] [place], [place] [address]    Report Status PENDING  Incomplete  Resp Panel by RT-PCR (Flu A&B, Covid) Nasopharyngeal Swab     Status: None   Collection Time: 11/21/21 11:09 PM   Specimen: Nasopharyngeal Swab; Nasopharyngeal(NP) swabs in vial transport medium  Result Value Ref Range Status   SARS Coronavirus 2 by RT PCR NEGATIVE NEGATIVE Final    Comment: (NOTE) SARS-CoV-2 target nucleic acids are NOT DETECTED.  The SARS-CoV-2 RNA is generally detectable in upper respiratory specimens during the acute phase of infection. The lowest concentration of SARS-CoV-2 viral copies this assay can detect is 138 copies/mL. A negative result does not preclude SARS-Cov-2 infection and should not be used as the sole basis for treatment or other patient management decisions. A negative  result may occur with  improper specimen collection/handling, submission of specimen other than nasopharyngeal swab, presence of viral mutation(s) within the areas targeted by this assay, and inadequate number of viral copies(<138 copies/mL). A negative result must be combined with clinical observations, patient history, and epidemiological information. The expected result is Negative.  Fact Sheet for Patients:  EntrepreneurPulse.com.au  Fact Sheet for Healthcare Providers:  IncredibleEmployment.be  This test is no t yet approved or cleared by the [place] FDA and  has been authorized for detection and/or diagnosis of SARS-CoV-2 by FDA under an Emergency Use Authorization (EUA). This EUA will remain  in effect (meaning this test can be used) for the duration of the COVID-19 declaration under Section 564(b)(1) of the Act, 21 U.S.C.section 360bbb-3(b)(1), unless the authorization is terminated  or revoked sooner.       Influenza A by PCR NEGATIVE NEGATIVE Final   Influenza B by PCR NEGATIVE NEGATIVE Final    Comment: (NOTE) The Xpert Xpress SARS-CoV-2/FLU/RSV plus assay is intended as an aid in the diagnosis of influenza from Nasopharyngeal swab specimens and should not be used as a sole basis for treatment. Nasal washings and aspirates are unacceptable for Xpert Xpress SARS-CoV-2/FLU/RSV testing.  Fact Sheet for Patients: EntrepreneurPulse.com.au  Fact Sheet for Healthcare Providers: IncredibleEmployment.be  This test is not yet approved or cleared by the [place] FDA and has been authorized for detection and/or diagnosis of SARS-CoV-2 by FDA under an Emergency Use Authorization (EUA). This EUA will remain in effect (meaning this test can be used) for the duration of the COVID-19 declaration under Section 564(b)(1) of the Act, 21 U.S.C. section 360bbb-3(b)(1), unless the authorization is  terminated or revoked.  Performed at [facility], [address] [place], [place] [address]      Scheduled Meds:  [START ON 11/24/2021] enoxaparin (LOVENOX) injection  65 mg Subcutaneous Q24H   feeding supplement (GLUCERNA SHAKE)  237 mL Oral TID BM   gabapentin  100 mg Oral BID   Gerhardt's butt cream   Topical TID   influenza vac split quadrivalent PF  0.5 mL Intramuscular Tomorrow-1000   insulin aspart  0-20 Units Subcutaneous TID WC   insulin aspart  0-5 Units Subcutaneous QHS   insulin glargine-yfgn  20 Units Subcutaneous QHS   nystatin   Topical BID   pantoprazole  40 mg Oral Daily   Continuous Infusions:  cefTRIAXone (ROCEPHIN)  IV 2 g (11/23/21 0942)   lactated ringers 125 mL/hr at 11/23/21 0613   vancomycin Stopped (11/22/21 2228)    Procedures/Studies: CT ABDOMEN PELVIS WO CONTRAST  Result Date: 11/21/2021 CLINICAL DATA:  Left lower quadrant abdominal pain. EXAM: CT ABDOMEN AND PELVIS WITHOUT CONTRAST TECHNIQUE: Multidetector CT imaging of the abdomen and pelvis was performed following the standard protocol without IV contrast. COMPARISON:  CT abdomen and pelvis 09/24/2017. FINDINGS: Lower chest: No acute abnormality. Hepatobiliary: No focal liver abnormality is seen. Status post cholecystectomy. Pancreas: Atrophic. Spleen: Mildly enlarged, unchanged. Adrenals/Urinary Tract: There is a 16 mm cyst in the right kidney. Otherwise, the adrenal glands, kidneys and bladder are within normal limits. Stomach/Bowel: Stomach is within normal limits. There is distal esophageal wall thickening. Appendix appears normal. No evidence of bowel wall thickening, distention, or inflammatory changes. Vascular/Lymphatic: Aortic atherosclerosis. No enlarged abdominal or pelvic lymph nodes. Reproductive: Uterus and bilateral adnexa are unremarkable. Other: There is no ascites or focal abdominal wall hernia. There is mild body wall edema. There is skin thickening measuring up to 16 mm in the left  anterior abdominal wall. No focal fluid collection or soft tissue gas. Musculoskeletal: Multilevel degenerative changes affect the spine IMPRESSION: 1. Skin thickening in the left anterior abdominal wall. Correlate clinically for infection. No evidence for abscess or soft tissue gas. 2. Mild splenomegaly. 3. Wall thickening of the distal esophagus concerning for esophagitis. Recommend clinical correlation follow-up to  exclude underlying lesion. 4.  Aortic Atherosclerosis (ICD10-I70.0). Electronically Signed   By: Ronney Asters M.D.   On: 11/21/2021 21:18   DG Tibia/Fibula Right  Result Date: 11/06/2021 CLINICAL DATA:  Fall, ulcers EXAM: RIGHT TIBIA AND FIBULA - 2 VIEW; RIGHT FOOT - 2 VIEW; LEFT FOOT - 2 VIEW COMPARISON:  None. FINDINGS: Right tibia, fibula and right foot: No evidence of fracture or dislocation. Vascular calcifications. Soft tissue swelling seen about the right first MTP joint. No osseous destruction or periosteal reaction. No significant degenerative changes. Left foot: No evidence fracture or dislocation. Vascular calcifications. Ulcer the superficial soft tissues overlying the first MTP joint. No osseous destruction or periosteal reaction. No significant degenerative changes. IMPRESSION: No evidence of fracture or osteomyelitis. Electronically Signed   By: Yetta Glassman M.D.   On: 11/06/2021 15:32   MR FOOT RIGHT WO CONTRAST  Result Date: 11/23/2021 CLINICAL DATA:  Foot swelling, diabetic, osteomyelitis suspected. No prior imaging. EXAM: MRI OF THE RIGHT FOREFOOT WITHOUT CONTRAST TECHNIQUE: Multiplanar, multisequence MR imaging of the right was performed. No intravenous contrast was administered. COMPARISON:  Radiographs dated November 21, 2021. FINDINGS: Bones/Joint/Cartilage No fracture or dislocation. Normal alignment. No joint effusion. No marrow signal abnormality. Ligaments Collateral ligaments are intact.  Lisfranc ligament is intact. Muscles and Tendons Flexor, peroneal and  extensor compartment tendons are intact. 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 No drainable fluid collection or abscess. Soft tissue Skin thickening and prominent subcutaneous soft tissue edema about the dorsal and plantar aspect of the forefoot. No drainable fluid collection or abscess. No soft tissue mass. IMPRESSION: 1.  No MR evidence of acute osteomyelitis. 2. Skin thickening and subcutaneous soft tissue edema consistent with cellulitis. No drainable fluid collection or abscess. Electronically Signed   By: Keane Police D.O.   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 EXAM: MRI OF THE LEFT FOOT WITHOUT CONTRAST TECHNIQUE: Multiplanar, multisequence MR imaging of the left was performed. No intravenous contrast was administered. COMPARISON:  Radiographs dated November 21, 2021 FINDINGS: Nondiagnostic examination due to significant motion. Subcutaneous soft tissue edema. IMPRESSION: Nondiagnostic examination due to motion. Subcutaneous soft tissue edema. Electronically Signed   By: Keane Police D.O.   On: 11/23/2021 11:37   US Abdomen Limited  Result Date: 11/22/2021 CLINICAL DATA:  Elevated liver enzymes. Gallbladder surgically absent. EXAM: ULTRASOUND ABDOMEN LIMITED RIGHT UPPER QUADRANT COMPARISON:  CT abdomen and pelvis, 11/21/2021. FINDINGS: Gallbladder: Surgically absent. Common bile duct: Diameter: 4 mm Liver: Suboptimally visualized. Normal overall size. No convincing mass. Portal vein is patent on color Doppler imaging with normal direction of blood flow towards the liver. Other: None. IMPRESSION: 1. Significantly limited study due to the patient's body habitus. 2. No acute findings. No bile duct dilation. Liver not well assessed, better visualized on the previous day's CT. Electronically Signed   By: Lajean Manes M.D.   On: 11/22/2021  09:01   US ARTERIAL ABI (SCREENING LOWER EXTREMITY)  Result Date: 11/23/2021 CLINICAL DATA:  60 year old female with a history bilateral wounds EXAM: NONINVASIVE PHYSIOLOGIC VASCULAR STUDY OF BILATERAL LOWER EXTREMITIES TECHNIQUE: Evaluation of both lower extremities was performed at rest, including calculation of ankle-brachial indices, multiple segmental pressure evaluation, segmental Doppler and segmental pulse volume recording. COMPARISON:  None. FINDINGS: Right ABI:  0.96 Left ABI:  1.35 Right Lower Extremity: Segmental Doppler at the right ankle demonstrates monophasic waveforms Left Lower Extremity:  Segmental Doppler at the left ankle demonstrates monophasic waveforms IMPRESSION: Resting ABI the bilateral lower extremity within normal limits, though could be falsely elevated given the segmental exam. Segmental Doppler at the bilateral ankles demonstrates monophasic waveforms. Signed, Dulcy Fanny. Dellia Nims, RPVI [facility] Electronically Signed   By: Corrie Mckusick D.O.   On: 11/23/2021 11:21   DG Foot 2 Views Left  Result Date: 11/06/2021 CLINICAL DATA:  Fall, ulcers EXAM: RIGHT TIBIA AND FIBULA - 2 VIEW; RIGHT FOOT - 2 VIEW; LEFT FOOT - 2 VIEW COMPARISON:  None. FINDINGS: Right tibia, fibula and right foot: No evidence of fracture or dislocation. Vascular calcifications. Soft tissue swelling seen about the right first MTP joint. No osseous destruction or periosteal reaction. No significant degenerative changes. Left foot: No evidence fracture or dislocation. Vascular calcifications. Ulcer the superficial soft tissues overlying the first MTP joint. No osseous destruction or periosteal reaction. No significant degenerative changes. IMPRESSION: No evidence of fracture or osteomyelitis. Electronically Signed   By: Yetta Glassman M.D.   On: 11/06/2021 15:32   DG Foot 2 Views Right  Result Date: 11/06/2021 CLINICAL DATA:  Fall, ulcers EXAM:  RIGHT TIBIA AND FIBULA - 2 VIEW; RIGHT FOOT - 2 VIEW; LEFT FOOT - 2 VIEW COMPARISON:  None. FINDINGS: Right tibia, fibula and right foot: No evidence of fracture or dislocation. Vascular calcifications. Soft tissue swelling seen about the right first MTP joint. No osseous destruction or periosteal reaction. No significant degenerative changes. Left foot: No evidence fracture or dislocation. Vascular calcifications. Ulcer the superficial soft tissues overlying the first MTP joint. No osseous destruction or periosteal reaction. No significant degenerative changes. IMPRESSION: No evidence of fracture or osteomyelitis. Electronically Signed   By: Yetta Glassman M.D.   On: 11/06/2021 15:32   DG Foot Complete Left  Result Date: 11/21/2021 CLINICAL DATA:  Wounds to both feet x2 weeks. EXAM: LEFT FOOT - COMPLETE 3+ VIEW COMPARISON:  November 06, 2021 FINDINGS: There is no evidence of fracture or dislocation. There is no evidence of arthropathy or other focal bone abnormality. A 1.2 cm superficial soft tissue ulceration is seen along the plantar aspect of the distal left foot. Moderate severity soft tissue swelling is also seen within this region. IMPRESSION: Plantar soft tissue ulceration and associated soft tissue swelling, as described above, without an acute osseous abnormality. Electronically Signed   By: Virgina Norfolk M.D.   On: 11/21/2021 21:33   DG Foot Complete Right  Result Date: 11/21/2021 CLINICAL DATA:  Wounds to both feet x2 weeks. EXAM: RIGHT FOOT COMPLETE - 3+ VIEW COMPARISON:  November 06, 2021 FINDINGS: There is no evidence of fracture or dislocation. There is no evidence of arthropathy or other focal bone abnormality. Mild to moderate severity vascular calcification is seen. Mild soft tissue swelling is seen along the plantar aspect of the distal right foot. IMPRESSION: Plantar soft tissue swelling, as described above, without an acute osseous abnormality. Electronically Signed   By: Virgina Norfolk M.D.   On: 11/21/2021 21:31    Orson Eva, DO  [facility]  If 7PM-7AM, please contact night-coverage [email or web address] Password [email or web address] 11/23/2021, 12:51 PM   LOS: 2 days

## 2021-11-23 NOTE — TOC Initial Note (Signed)
Transition of Care [facility]) - Initial/Assessment Note    Patient Details  Name: [name] MRN: [id number] Date of Birth: 07/20/1961  Transition of Care [facility]) CM/SW Contact:    Shade Flood, LCSW Phone Number: 11/23/2021, 2:32 PM  Clinical Narrative:                  Pt admitted from home with spouse. MD anticipating pt may need SNF at dc. Reviewed pt's record. No PT/OT recommendations as of yet. Pt has multiple wound care needs and will have surgical consult today per MD.   TOC will follow and continue to assess and assist with dc planning.  Expected Discharge Plan: Skilled Nursing Facility Barriers to Discharge: Continued Medical Work up   Patient Goals and CMS Choice        Expected Discharge Plan and Services Expected Discharge Plan: [place] In-house Referral: Clinical Social Work     Living arrangements for the past 2 months: Single Family Home                                      Prior Living Arrangements/Services Living arrangements for the past 2 months: Single Family Home Lives with:: Spouse Patient language and need for interpreter reviewed:: Yes        Need for Family Participation in Patient Care: Yes (Comment) Care giver support system in place?: Yes (comment)   Criminal Activity/Legal Involvement Pertinent to Current Situation/Hospitalization: No - Comment as needed  Activities of Daily Living Home Assistive Devices/Equipment: Walker (specify type) ADL Screening (condition at time of admission) Patient's cognitive ability adequate to safely complete daily activities?: Yes Is the patient deaf or have difficulty hearing?: No Does the patient have difficulty seeing, even when wearing glasses/contacts?: No Does the patient have difficulty concentrating, remembering, or making decisions?: No Patient able to express need for assistance with ADLs?: Yes Does the patient have difficulty dressing or bathing?:  Yes Independently performs ADLs?: No Communication: Independent Dressing (OT): Needs assistance Is this a change from baseline?: Pre-admission baseline Grooming: Needs assistance Is this a change from baseline?: Pre-admission baseline Feeding: Independent Bathing: Needs assistance Is this a change from baseline?: Pre-admission baseline Toileting: Needs assistance Is this a change from baseline?: Pre-admission baseline In/Out Bed: Needs assistance Is this a change from baseline?: Pre-admission baseline Walks in Home: Independent Does the patient have difficulty walking or climbing stairs?: Yes Weakness of Legs: Both Weakness of Arms/Hands: None  Permission Sought/Granted                  Emotional Assessment       Orientation: : Oriented to Self Alcohol / Substance Use: Not Applicable Psych Involvement: No (comment)  Admission diagnosis:  Diabetic foot ([place]) [E11.8] Elevated liver enzymes [R74.8] Lower extremity cellulitis [L03.119] Patient Active Problem List   Diagnosis Date Noted   Open wound of left foot 11/22/2021   Chronic venous stasis 11/22/2021   Leukocytosis 11/22/2021   Thrombocytosis 11/22/2021   Hyperglycemia due to diabetes mellitus ([place]) 11/22/2021   Lactic acidosis 11/22/2021   Hypoalbuminemia due to protein-calorie malnutrition ([place]) 11/22/2021   Elevated liver enzymes 11/22/2021   Mixed hyperlipidemia 11/22/2021   GERD (gastroesophageal reflux disease) 11/22/2021   Chronic diastolic CHF (congestive heart failure) ([place]) 11/22/2021   Diabetic foot infection ([place]) 11/22/2021   Uncontrolled type 2 diabetes mellitus with hyperglycemia, with long-term current use of insulin ([place]) 11/22/2021  Lower extremity cellulitis 11/21/2021   Morbid obesity with BMI of 50.0-59.9, adult ([place]) 01/29/2020   Educated about COVID-19 virus infection 01/29/2020   OSA (obstructive sleep apnea) 12/21/2019   Asthma 10/05/5944   Diastolic dysfunction 85/92/9244    Prolonged QT interval 05/14/2015   Palpitations 07/23/2014   Generalized weakness 07/23/2014   Diabetes mellitus ([place]) 07/22/2014   Essential hypertension 07/22/2014   COPD (chronic obstructive pulmonary disease) ([place]) 07/22/2014   Depression 07/22/2014   PCP:  Bridget Hartshorn, NP Pharmacy:   [facility], [address] [place] [address] Phone: [phone] Fax: [phone]     Social Determinants of Health (SDOH) Interventions    Readmission Risk Interventions Readmission Risk Prevention Plan 11/23/2021  Transportation Screening Complete  Medication Review (RN CM) Complete  Some recent data might be hidden

## 2021-11-23 NOTE — Progress Notes (Addendum)
Inpatient Diabetes Program Recommendations  AACE/ADA: New Consensus Statement on Inpatient Glycemic Control (2015)  Target Ranges:  Prepandial:   less than 140 mg/dL      Peak postprandial:   less than 180 mg/dL (1-2 hours)      Critically ill patients:  140 - 180 mg/dL   Lab Results  Component Value Date   GLUCAP 212 (H) 11/23/2021   HGBA1C 9.9 (H) 11/21/2021    Review of Glycemic Control  Diabetes history: DM2 Outpatient Diabetes medications: 70/30 80 units with breakfast and 60 units with lunch Current orders for Inpatient glycemic control: Semglee 20 units QD, Novolog 0-20 units TID and 0-5 QHS  Atempted to call patient and speak with her about her A1C and DM management at home.  She answered and said "Ronny Flurry" and then hung up.  I tried to call her back but no answer.  I reached out to nurse and she states that the patient is mostly confused. MD notes states she and boyfriend are very poor historians.  Question how she is managing DM at home using 70/30 insulin.   Will continue to follow while inpatient.  Thank you, Reche Dixon, RN, BSN Diabetes Coordinator Inpatient Diabetes Program [phone] (team pager from 8a-5p)

## 2021-11-24 DIAGNOSIS — L8990 Pressure ulcer of unspecified site, unspecified stage: Secondary | ICD-10-CM | POA: Insufficient documentation

## 2021-11-24 LAB — CBC
HCT: 36.2 % (ref 36.0–46.0)
Hemoglobin: 11.5 g/dL — ABNORMAL LOW (ref 12.0–15.0)
MCH: 25.8 pg — ABNORMAL LOW (ref 26.0–34.0)
MCHC: 31.8 g/dL (ref 30.0–36.0)
MCV: 81.2 fL (ref 80.0–100.0)
Platelets: 371 10*3/uL (ref 150–400)
RBC: 4.46 MIL/uL (ref 3.87–5.11)
RDW: 15.3 % (ref 11.5–15.5)
WBC: 11.3 10*3/uL — ABNORMAL HIGH (ref 4.0–10.5)
nRBC: 0 % (ref 0.0–0.2)

## 2021-11-24 LAB — SEDIMENTATION RATE: Sed Rate: 85 mm/hr — ABNORMAL HIGH (ref 0–22)

## 2021-11-24 LAB — COMPREHENSIVE METABOLIC PANEL
ALT: 120 U/L — ABNORMAL HIGH (ref 0–44)
AST: 280 U/L — ABNORMAL HIGH (ref 15–41)
Albumin: 2.2 g/dL — ABNORMAL LOW (ref 3.5–5.0)
Alkaline Phosphatase: 116 U/L (ref 38–126)
Anion gap: 9 (ref 5–15)
BUN: 18 mg/dL (ref 6–20)
CO2: 29 mmol/L (ref 22–32)
Calcium: 8.6 mg/dL — ABNORMAL LOW (ref 8.9–10.3)
Chloride: 104 mmol/L (ref 98–111)
Creatinine, Ser: 1.36 mg/dL — ABNORMAL HIGH (ref 0.44–1.00)
GFR, Estimated: 45 mL/min — ABNORMAL LOW (ref >60)
Glucose, Bld: 124 mg/dL — ABNORMAL HIGH (ref 70–99)
Potassium: 3.1 mmol/L — ABNORMAL LOW (ref 3.5–5.1)
Sodium: 142 mmol/L (ref 135–145)
Total Bilirubin: 0.6 mg/dL (ref 0.3–1.2)
Total Protein: 6.4 g/dL — ABNORMAL LOW (ref 6.5–8.1)

## 2021-11-24 LAB — GLUCOSE, CAPILLARY
Glucose-Capillary: 202 mg/dL — ABNORMAL HIGH (ref 70–99)
Glucose-Capillary: 153 mg/dL — ABNORMAL HIGH (ref 70–99)
Glucose-Capillary: 224 mg/dL — ABNORMAL HIGH (ref 70–99)
Glucose-Capillary: 309 mg/dL — ABNORMAL HIGH (ref 70–99)
Glucose-Capillary: 259 mg/dL — ABNORMAL HIGH (ref 70–99)

## 2021-11-24 LAB — VANCOMYCIN, TROUGH: Vancomycin Tr: 16 ug/mL (ref 15–20)

## 2021-11-24 LAB — CMV DNA, QUANTITATIVE, PCR
CMV DNA Quant: NEGATIVE IU/mL
Log10 CMV Qn DNA Pl: UNDETERMINED log10 IU/mL

## 2021-11-24 LAB — MAGNESIUM: Magnesium: 1.8 mg/dL (ref 1.7–2.4)

## 2021-11-24 MED ORDER — INSULIN ASPART 100 UNIT/ML IJ SOLN: 4.0000 [IU] | Freq: Three times a day (TID) | INTRAMUSCULAR | Status: DC

## 2021-11-24 MED ORDER — HYDRALAZINE HCL 20 MG/ML IJ SOLN: 10.0000 mg | Freq: Four times a day (QID) | INTRAMUSCULAR | Status: DC | PRN

## 2021-11-24 MED ORDER — POVIDONE-IODINE 10 % EX SOLN
CUTANEOUS | Status: AC
  Filled 2021-11-24: qty 14.8

## 2021-11-24 MED ORDER — AMLODIPINE BESYLATE 5 MG PO TABS
5.0000 mg | ORAL_TABLET | Freq: Every day | ORAL | Status: DC
  Administered 2021-11-24 – 2021-11-27 (×4): 5 mg via ORAL
  Filled 2021-11-24 (×4): qty 1

## 2021-11-24 MED ORDER — HYDRALAZINE HCL 25 MG PO TABS
25.0000 mg | ORAL_TABLET | Freq: Three times a day (TID) | ORAL | Status: DC
  Administered 2021-11-24 – 2021-11-27 (×9): 25 mg via ORAL
  Filled 2021-11-24 (×9): qty 1

## 2021-11-24 MED ORDER — POTASSIUM CHLORIDE CRYS ER 20 MEQ PO TBCR
40.0000 meq | EXTENDED_RELEASE_TABLET | Freq: Once | ORAL | Status: AC
  Administered 2021-11-24: 09:00:00 40 meq via ORAL
  Filled 2021-11-24: qty 2

## 2021-11-24 NOTE — Progress Notes (Signed)
Inpatient Diabetes Program Recommendations  AACE/ADA: New Consensus Statement on Inpatient Glycemic Control (2015)  Target Ranges:  Prepandial:   less than 140 mg/dL      Peak postprandial:   less than 180 mg/dL (1-2 hours)      Critically ill patients:  140 - 180 mg/dL   Lab Results  Component Value Date   GLUCAP 153 (H) 11/24/2021   HGBA1C 9.9 (H) 11/21/2021    Review of Glycemic Control  Latest Reference Range & Units 11/23/21 07:04 11/23/21 10:55 11/23/21 16:04 11/23/21 20:33 11/24/21 07:23  Glucose-Capillary 70 - 99 mg/dL 158 (H) 212 (H) 271 (H) 202 (H) 153 (H)  (H): Data is abnormally high   Inpatient Diabetes Program Recommendations:    Might consider, Novolog 4 units TID with meals.  Will continue to follow while inpatient.  Thank you, Teresa Dixon, RN, BSN Diabetes Coordinator Inpatient Diabetes Program [phone] (team pager from 8a-5p)

## 2021-11-24 NOTE — TOC Progression Note (Signed)
Transition of Care [facility]) - Progression Note    Patient Details  Name: [name] MRN: [id number] Date of Birth: [birth year]  [facility] CM/SW Contact  Shade Flood, LCSW Phone Number: 11/24/2021, 11:20 AM  Clinical Narrative:     TOC following. Reviewed pt's status with MD in Progression today. Per MD, pt will need IV anbx at dc and she is agreeable to SNF referral. Spoke with pt at bedside to review dc planning. Pt confirms that she would like SNF at dc. CMS provider options reviewed and will refer as requested.  MD anticipating pt will be medically stable for dc tomorrow. Pt will need insurance auth and PASRR screen completed before she can dc. Auth and PASRR process started this AM.  TOC will follow.  Expected Discharge Plan: [place] Barriers to Discharge: Continued Medical Work up  Expected Discharge Plan and Services Expected Discharge Plan: [place] In-house Referral: Clinical Social Work   Post Acute Care Choice: [place] Living arrangements for the past 2 months: Single Family Home                                       Social Determinants of Health (SDOH) Interventions    Readmission Risk Interventions Readmission Risk Prevention Plan 11/23/2021  Transportation Screening Complete  Medication Review (RN CM) Complete  Some recent data might be hidden

## 2021-11-24 NOTE — Plan of Care (Signed)
°  Problem: Acute Rehab PT Goals(only PT should resolve) Goal: Pt Will Go Supine/Side To Sit Outcome: Progressing Flowsheets (Taken 11/24/2021 1101) Pt will go Supine/Side to Sit: with moderate assist Goal: Pt Will Go Sit To Supine/Side Outcome: Progressing Flowsheets (Taken 11/24/2021 1101) Pt will go Sit to Supine/Side: with moderate assist Goal: Patient Will Transfer Sit To/From Stand Outcome: Progressing Flowsheets (Taken 11/24/2021 1101) Patient will transfer sit to/from stand:  with moderate assist  with maximum assist Goal: Pt Will Transfer Bed To Chair/Chair To Bed Outcome: Progressing Flowsheets (Taken 11/24/2021 1101) Pt will Transfer Bed to Chair/Chair to Bed:  with mod assist  with max assist Goal: Pt/caregiver will Perform Home Exercise Program Outcome: Progressing Flowsheets (Taken 11/24/2021 1101) Pt/caregiver will Perform Home Exercise Program:  For increased strengthening  For improved balance  Independently  11:02 AM, 11/24/21 Mearl Latin PT, DPT Physical Therapist at [facility]

## 2021-11-24 NOTE — Evaluation (Signed)
Physical Therapy Evaluation Patient Details Name: [name] MRN: [id number] DOB: 16-Mar-1961 Today's Date: 11/24/2021  History of Present Illness  [name] is a 60 y.o. female with medical history significant for COPD, hypertension, diastolic CHF, GERD, hyperlipidemia, T2DM who presents to the emergency department due to bilateral worsening pain in her feet bilaterally.  Patient presented to the ED on 12/2, she was noted to have hypokalemia and right leg cellulitis, she was prescribed for 10-day prescription of doxycycline, patient states that she was compliant and finished the antibiotic regimen, but endorsed having diarrhea for about 3 to 4 days after completing the antibiotic which lasts BM being yesterday.  She states that she had a fall last night at home without losing consciousness and was unable to get up, husband helped in getting off the floor, she complained of generalized weakness and ongoing pain in feet bilaterally, so she decided to go to the ED for further evaluation and management.  She endorsed nausea, but denies vomiting, chest pain, shortness of breath, headache, numbness, tingling, extremity weakness, burning sensation on urination or increased frequency in urination   Clinical Impression  Patient limited for functional mobility as stated below secondary to BLE weakness, fatigue and poor standing balance. Patient requires mod/max assist to transition to seated EOB secondary to weakness. She demonstrates good sitting balance EOB. Attempted to transfer to standing with RW and max assist but patient unable due to severe LE weakness. Patient will benefit from continued physical therapy in hospital and recommended venue below to increase strength, balance, endurance for safe ADLs and gait.        Recommendations for follow up therapy are one component of a multi-disciplinary discharge planning process, led by the attending physician.  Recommendations may be updated  based on patient status, additional functional criteria and insurance authorization.  Follow Up Recommendations Skilled nursing-short term rehab (<3 hours/day)    Assistance Recommended at Discharge Frequent or constant Supervision/Assistance  Functional Status Assessment Patient has had a recent decline in their functional status and demonstrates the ability to make significant improvements in function in a reasonable and predictable amount of time.  Equipment Recommendations  None recommended by PT    Recommendations for Other Services       Precautions / Restrictions Precautions Precautions: Fall Restrictions Weight Bearing Restrictions: No      Mobility  Bed Mobility Overal bed mobility: Needs Assistance Bed Mobility: Supine to Sit;Sit to Supine     Supine to sit: Mod assist;Max assist Sit to supine: Max assist   General bed mobility comments: slow, labored    Transfers Overall transfer level: Needs assistance Equipment used: Rolling walker (2 wheels) Transfers: Sit to/from Stand Sit to Stand: Max assist           General transfer comment: patient unable to come to full standing from bed with RW and max assist due to weakness    Ambulation/Gait                  Stairs            Wheelchair Mobility    Modified Rankin (Stroke Patients Only)       Balance Overall balance assessment: Needs assistance Sitting-balance support: No upper extremity supported;Feet supported Sitting balance-Leahy Scale: Good Sitting balance - Comments: fair/good seated EOB  Pertinent Vitals/Pain Pain Assessment: No/denies pain    Home Living Family/patient expects to be discharged to:: Private residence Living Arrangements: Spouse/significant other Available Help at Discharge: Family Type of Home: House Home Access: Stairs to enter Entrance Stairs-Rails: Can reach both Entrance Stairs-Number of Steps:  2 Alternate Level Stairs-Number of Steps: 6 Home Layout: Two level Home Equipment: BSC/3in1;Rollator (4 wheels);Shower seat;Cane - single point      Prior Function Prior Level of Function : Needs assist             Mobility Comments: patient states household ambulator with RW ADLs Comments: Independent with bathing and dressing, family assists PRN     Hand Dominance        Extremity/Trunk Assessment   Upper Extremity Assessment Upper Extremity Assessment: Generalized weakness    Lower Extremity Assessment Lower Extremity Assessment: Generalized weakness       Communication   Communication: No difficulties  Cognition Arousal/Alertness: Awake/alert Behavior During Therapy: WFL for tasks assessed/performed Overall Cognitive Status: Within Functional Limits for tasks assessed                                          General Comments      Exercises     Assessment/Plan    PT Assessment Patient needs continued PT services  PT Problem List Decreased strength;Decreased activity tolerance;Decreased balance;Decreased mobility;Decreased skin integrity       PT Treatment Interventions DME instruction;Balance training;Gait training;Neuromuscular re-education;Stair training;Functional mobility training;Patient/family education;Therapeutic activities;Therapeutic exercise    PT Goals (Current goals can be found in the Care Plan section)  Acute Rehab PT Goals Patient Stated Goal: Return home PT Goal Formulation: With patient Time For Goal Achievement: 12/08/21 Potential to Achieve Goals: Good    Frequency Min 3X/week   Barriers to discharge        Co-evaluation               AM-PAC PT "6 Clicks" Mobility  Outcome Measure Help needed turning from your back to your side while in a flat bed without using bedrails?: A Little Help needed moving from lying on your back to sitting on the side of a flat bed without using bedrails?: A Lot Help  needed moving to and from a bed to a chair (including a wheelchair)?: Total Help needed standing up from a chair using your arms (e.g., wheelchair or bedside chair)?: Total Help needed to walk in hospital room?: Total Help needed climbing 3-5 steps with a railing? : Total 6 Click Score: 9    End of Session Equipment Utilized During Treatment: Gait belt Activity Tolerance: Patient limited by fatigue Patient left: in bed;with call bell/phone within reach;with nursing/sitter in room Nurse Communication: Mobility status PT Visit Diagnosis: Unsteadiness on feet (R26.81);Other abnormalities of gait and mobility (R26.89);Muscle weakness (generalized) (M62.81)    Time: 1700-1749 PT Time Calculation (min) (ACUTE ONLY): 24 min   Charges:   PT Evaluation $PT Eval Low Complexity: 1 Low PT Treatments $Therapeutic Activity: 8-22 mins        11:00 AM, 11/24/21 Mearl Latin PT, DPT Physical Therapist at [facility]

## 2021-11-24 NOTE — Progress Notes (Signed)
Transition of Care (TOC) -30 day Note       Patient Details  Name: [name] MRN: [id number] Date of Birth: [birth year]   Transition of Care [facility]) CM/SW Contact  Name: Shade Flood Phone Number: [phone] Date: 11/24/2021 Time: 11:30    To Whom it May Concern:   Please be advised that the above patient will require a short-term nursing home stay, anticipated 30 days or less rehabilitation and strengthening. The plan is for return home.

## 2021-11-24 NOTE — Progress Notes (Addendum)
PROGRESS NOTE  [name] WGN:[id number] DOB: [birth year] DOA: 11/21/2021 PCP: Bridget Hartshorn, NP  Brief History:  60 year old female with a history of OSA, diabetes mellitus type 2, hypertension, depression, morbid obesity presenting with generalized weakness with associated nausea and vomiting for the past 2 days.  Unfortunately, the patient is a difficult historian at best.  In addition, I also try to contact the patient's spouse at home who is also a difficult historian at best. As best as I was able to decipher, it appears that the patient has had generalized weakness, dizziness, and nausea for the better part of the least the past 2 weeks.  It appears that for the past 5-6 days her symptoms have worsened to the point where she has been essentially unable to get out of bed.  Her spouse states that he has had to help her get out of bed, but he is having difficulty due to his own medical issues.  Occasionally, the patient has been soiling herself in bed.  Prior to her acute illness, the patient was able to make transfers and walk with a walker, but her functional status has significantly declined over the past 2 weeks.  The patient endorses nausea and vomiting for the past 2 days prior to admission.  She has had some loose stools but denies any frank diarrhea, hematochezia, melena.  She denies any chest pain, shortness breath, coughing, hemoptysis.  There is been no fevers, chills, headache, neck pain.   Apparently, the patient visited the emergency department on 11/06/2021 for lower extremity pain.  She was diagnosed with cellulitis and sent home with doxycycline which she has taken.  She states that she chronically has lower extremity pain for which she takes gabapentin.  She is not really able to clarify whether she has had any worsening drainage, erythema, or edema in her bilateral legs.  Unfortunately her husband cannot clarify the situation any further.  When asked about  previous wound care, the patient and spouse do not remember have any chronic wound care, but review of the medical record shows that the patient has been to the wound care center to see Dr. Dellia Nims back in November 2021.   Lastly, the patient and spouse seem to have some poor insight regarding her the patient's insulin usage.  Spouse has to assist the patient with her insulin, but he also states that the patient takes her medications independently.  There is much conflicting information regarding exactly how much insulin the patient is taking.  However review of the medical record shows that she had a recent PCP visit for which was documented that she was taking 70/30 insulin 100 units twice daily.  The patient currently states that she takes 70/3060 units twice daily with Humalog R in the mornings and with supper.  He is not really able to clarify exactly how much Humalog she takes.   In the emergency department, the patient was afebrile hemodynamically stable with oxygen saturation 99% room air.  BMP showed sodium 141, potassium 4.0, bicarbonate 32, serum creatinine 1.27.  AST is 177, ALT 55, alkaline phosphatase 118, total bilirubin 1.1.  WBC 18.8, hemoglobin 14.5, platelets 450,000.  Lactic acid 2.0.  The patient was started on vancomycin.  She was given a 1 L bolus of normal saline.    Assessment/Plan: Diabetic foot infection/Osteomyelitis left foot -Continue vancomycin and ceftriaxone -Check ABIs>>Right ABI:  0.96;  Left ABI:  1.35 -Wound care consult  for numerous lower extremity wounds -ESR 85 -CRP--21.5 -Personally reviewed x-rays of the foot--no erosions noted -MR Right Foot>>no osteomyelitis, no abscess -MR LEFT Foot>>limited by movement -try CT of LEFT FOOT--No cortical erosion or periosteal reaction concerning for osteomyelitis; no abscess -general surgery consult appreciated>>debrided left foot ulcer -CT right tib/fib--no abscess, no osteo; sm knee effusion -despite no radiographic  evidence of osteomyelitis>>wound probes to bone = contiguous focus osteomyelitis -Vascular Access RN unsuccessful PICC placement>>IR consult for PICC -plan vanc/ceftrixone x 4 weeks (last day 12/20/21) -then po doxy+amox/clav x 2 weeks thereafter (start 12/21/21) -pt agrees for SNF for wound care and abx    Uncontrolled diabetes mellitus type 2 with hyperglycemia -The patient had anion gap 12 with ketonuria but normal bicarbonate -Continue IV fluids -Increase dSemglee to 20 units -Resistant sliding scale -12/17  A1c--9.9 -add novolog 4 units with meals   Abdominal wall cellulitis/Cellulitis leg -Antibiotics as discussed above -Wound care consult appreciated -overall improved   Transaminasemia -Holding statin -11/21/2021 CT abd--skin thickening left anterior abdominal wall without abscess.  Mild splenomegaly.  Atrophic pancreas.  Status postcholecystectomy.  No bowel wall thickening. -RUQ ultrasound--nondiagnostic due to body habitus -Viral hepatitis panel negative -Urine drug screen -EBV DNA--pending -CMV DNA--neg -GI consult   Acute kidney injury -Baseline creatinine 0.8-1.1 -Serum creatinine peaked at 1.54 -improving with IVF>>continue   Pyuria -UA 11-20 WBC -urine culture was not obtained   Acute metabolic encephalopathy -The patient remains confused intially -Secondary to infectious process -overall improved   Lactic acidosis -Secondary to volume depletion -Started IV fluids   GERD -continue PPI   Morbid Obesity -BMI 55.40 -lifestyle modification   Hypokalemia -replete -check mag--1.8             Family Communication:   spouse updated  at bedside 12/20  Consultants:  general surgery  Code Status:  FULL   DVT Prophylaxis:  [place] Lovenox   Procedures: As Listed in Progress Note Above  Antibiotics: None      Subjective: Vanc 12/17>> Ceftriaxone 12/18>>    Objective: Vitals:   11/23/21 2034 11/24/21 0422 11/24/21 1237 11/24/21  1459  BP: (!) 183/69 (!) 174/78 (!) 197/73 (!) 198/77  Pulse: 86 88 85   Resp:   20   Temp: 97.9 F (36.6 C) 97.7 F (36.5 C) 97.7 F (36.5 C)   TempSrc: Oral Oral Oral   SpO2: 96% 93% 95%   Weight:      Height:        Intake/Output Summary (Last 24 hours) at 11/24/2021 1538 Last data filed at 11/24/2021 1250 Gross per 24 hour  Intake 1280 ml  Output --  Net 1280 ml   Weight change:  Exam:  General:  Pt is alert, follows commands appropriately, not in acute distress HEENT: No icterus, No thrush, No neck mass, [place]/AT Cardiovascular: RRR, S1/S2, no rubs, no gallops Respiratory: CTA bilaterally, no wheezing, no crackles, no rhonchi Abdomen: Soft/+BS, non tender, non distended, no guarding Extremities: left first plantar MTPJ area with ulcer with malodorous drainage;  erythema of right calf without necrosis or crepitance   Data Reviewed: I have personally reviewed following labs and imaging studies Basic Metabolic Panel: Recent Labs  Lab 11/21/21 1634 11/22/21 0449 11/23/21 0411 11/24/21 0527  NA 141 140 138 142  K 4.0 3.9 3.4* 3.1*  CL 97* 100 100 104  CO2 32 $Remo'28 26 29  'vgdoo$ GLUCOSE 254* 293* 145* 124*  BUN 18 22* 25* 18  CREATININE 1.27* 1.48* 1.54* 1.36*  CALCIUM 9.0  8.4* 8.4* 8.6*  MG 2.0 2.1  --  1.8  PHOS  --  5.1*  --   --    Liver Function Tests: Recent Labs  Lab 11/21/21 1634 11/22/21 0449 11/23/21 0411 11/24/21 0527  AST 177* 286* 258* 280*  ALT 55* 94* 106* 120*  ALKPHOS 118 115 113 116  BILITOT 1.1 0.9 0.5 0.6  PROT 7.0 6.3* 6.3* 6.4*  ALBUMIN 2.6* 2.5* 2.4* 2.2*   Recent Labs  Lab 11/21/21 1634  LIPASE 18   No results for input(s): AMMONIA in the last 168 hours. Coagulation Profile: No results for input(s): INR, PROTIME in the last 168 hours. CBC: Recent Labs  Lab 11/21/21 1634 11/22/21 0449 11/23/21 0411 11/24/21 0527  WBC 18.8* 15.9* 12.5* 11.3*  NEUTROABS 16.7*  --   --   --   HGB 14.5 12.5 11.8* 11.5*  HCT 45.3 39.1 36.2 36.2   MCV 80.9 82.1 80.4 81.2  PLT 458* 416* 396 371   Cardiac Enzymes: No results for input(s): CKTOTAL, CKMB, CKMBINDEX, TROPONINI in the last 168 hours. BNP: Invalid input(s): POCBNP CBG: Recent Labs  Lab 11/23/21 1055 11/23/21 1604 11/23/21 2033 11/24/21 0723 11/24/21 1106  GLUCAP 212* 271* 202* 153* 224*   HbA1C: Recent Labs    11/21/21 1634  HGBA1C 9.9*   Urine analysis:    Component Value Date/Time   COLORURINE AMBER (A) 11/21/2021 1626   APPEARANCEUR CLOUDY (A) 11/21/2021 1626   LABSPEC 1.016 11/21/2021 1626   PHURINE 5.0 11/21/2021 1626   GLUCOSEU 50 (A) 11/21/2021 1626   HGBUR LARGE (A) 11/21/2021 1626   BILIRUBINUR NEGATIVE 11/21/2021 1626   KETONESUR 5 (A) 11/21/2021 1626   PROTEINUR >=300 (A) 11/21/2021 1626   UROBILINOGEN 0.2 05/15/2015 0112   NITRITE NEGATIVE 11/21/2021 1626   LEUKOCYTESUR NEGATIVE 11/21/2021 1626   Sepsis Labs: $RemoveBefo'@[email or web address]'[id number]$ (procalcitonin:4,lacticidven:4) ) Recent Results (from the past 240 hour(s))  Blood culture (routine x 2)     Status: None (Preliminary result)   Collection Time: 11/21/21 10:45 PM   Specimen: BLOOD RIGHT HAND  Result Value Ref Range Status   Specimen Description   Final    BLOOD RIGHT HAND BOTTLES DRAWN AEROBIC AND ANAEROBIC   Special Requests Blood Culture adequate volume  Final   Culture   Final    NO GROWTH 2 DAYS Performed at [facility], [address] [place], [place] [address]    Report Status PENDING  Incomplete  Blood culture (routine x 2)     Status: None (Preliminary result)   Collection Time: 11/21/21 10:54 PM   Specimen: BLOOD LEFT HAND  Result Value Ref Range Status   Specimen Description   Final    BLOOD LEFT HAND BOTTLES DRAWN AEROBIC AND ANAEROBIC   Special Requests Blood Culture adequate volume  Final   Culture   Final    NO GROWTH 2 DAYS Performed at [facility], [address], [place], [place] [address]    Report Status PENDING  Incomplete  Resp Panel by RT-PCR (Flu A&B, Covid)  Nasopharyngeal Swab     Status: None   Collection Time: 11/21/21 11:09 PM   Specimen: Nasopharyngeal Swab; Nasopharyngeal(NP) swabs in vial transport medium  Result Value Ref Range Status   SARS Coronavirus 2 by RT PCR NEGATIVE NEGATIVE Final    Comment: (NOTE) SARS-CoV-2 target nucleic acids are NOT DETECTED.  The SARS-CoV-2 RNA is generally detectable in upper respiratory specimens during the acute phase of infection. The lowest concentration of SARS-CoV-2 viral copies this  assay can detect is 138 copies/mL. A negative result does not preclude SARS-Cov-2 infection and should not be used as the sole basis for treatment or other patient management decisions. A negative result may occur with  improper specimen collection/handling, submission of specimen other than nasopharyngeal swab, presence of viral mutation(s) within the areas targeted by this assay, and inadequate number of viral copies(<138 copies/mL). A negative result must be combined with clinical observations, patient history, and epidemiological information. The expected result is Negative.  Fact Sheet for Patients:  EntrepreneurPulse.com.au  Fact Sheet for Healthcare Providers:  IncredibleEmployment.be  This test is no t yet approved or cleared by the [place] FDA and  has been authorized for detection and/or diagnosis of SARS-CoV-2 by FDA under an Emergency Use Authorization (EUA). This EUA will remain  in effect (meaning this test can be used) for the duration of the COVID-19 declaration under Section 564(b)(1) of the Act, 21 U.S.C.section 360bbb-3(b)(1), unless the authorization is terminated  or revoked sooner.       Influenza A by PCR NEGATIVE NEGATIVE Final   Influenza B by PCR NEGATIVE NEGATIVE Final    Comment: (NOTE) The Xpert Xpress SARS-CoV-2/FLU/RSV plus assay is intended as an aid in the diagnosis of influenza from Nasopharyngeal swab specimens and should not be  used as a sole basis for treatment. Nasal washings and aspirates are unacceptable for Xpert Xpress SARS-CoV-2/FLU/RSV testing.  Fact Sheet for Patients: EntrepreneurPulse.com.au  Fact Sheet for Healthcare Providers: IncredibleEmployment.be  This test is not yet approved or cleared by the [place] FDA and has been authorized for detection and/or diagnosis of SARS-CoV-2 by FDA under an Emergency Use Authorization (EUA). This EUA will remain in effect (meaning this test can be used) for the duration of the COVID-19 declaration under Section 564(b)(1) of the Act, 21 U.S.C. section 360bbb-3(b)(1), unless the authorization is terminated or revoked.  Performed at [facility], [address] [place], [place] [address]      Scheduled Meds:  amLODipine  5 mg Oral Daily   enoxaparin (LOVENOX) injection  65 mg Subcutaneous Q24H   feeding supplement (GLUCERNA SHAKE)  237 mL Oral TID BM   gabapentin  100 mg Oral BID   Gerhardt's butt cream   Topical TID   hydrALAZINE  25 mg Oral Q8H   influenza vac split quadrivalent PF  0.5 mL Intramuscular Tomorrow-1000   insulin aspart  0-20 Units Subcutaneous TID WC   insulin aspart  0-5 Units Subcutaneous QHS   insulin aspart  4 Units Subcutaneous TID WC   insulin glargine-yfgn  20 Units Subcutaneous QHS   liver oil-zinc oxide   Topical TID   nystatin   Topical BID   pantoprazole  40 mg Oral Daily   Continuous Infusions:  cefTRIAXone (ROCEPHIN)  IV 2 g (11/24/21 0940)   lactated ringers 125 mL/hr at 11/24/21 0929   vancomycin 1,250 mg (11/23/21 2027)    Procedures/Studies: CT ABDOMEN PELVIS WO CONTRAST  Result Date: 11/21/2021 CLINICAL DATA:  Left lower quadrant abdominal pain. EXAM: CT ABDOMEN AND PELVIS WITHOUT CONTRAST TECHNIQUE: Multidetector CT imaging of the abdomen and pelvis was performed following the standard protocol without IV contrast. COMPARISON:  CT abdomen and pelvis 09/24/2017. FINDINGS:  Lower chest: No acute abnormality. Hepatobiliary: No focal liver abnormality is seen. Status post cholecystectomy. Pancreas: Atrophic. Spleen: Mildly enlarged, unchanged. Adrenals/Urinary Tract: There is a 16 mm cyst in the right kidney. Otherwise, the adrenal glands, kidneys and bladder are within normal limits. Stomach/Bowel: Stomach is  within normal limits. There is distal esophageal wall thickening. Appendix appears normal. No evidence of bowel wall thickening, distention, or inflammatory changes. Vascular/Lymphatic: Aortic atherosclerosis. No enlarged abdominal or pelvic lymph nodes. Reproductive: Uterus and bilateral adnexa are unremarkable. Other: There is no ascites or focal abdominal wall hernia. There is mild body wall edema. There is skin thickening measuring up to 16 mm in the left anterior abdominal wall. No focal fluid collection or soft tissue gas. Musculoskeletal: Multilevel degenerative changes affect the spine IMPRESSION: 1. Skin thickening in the left anterior abdominal wall. Correlate clinically for infection. No evidence for abscess or soft tissue gas. 2. Mild splenomegaly. 3. Wall thickening of the distal esophagus concerning for esophagitis. Recommend clinical correlation follow-up to exclude underlying lesion. 4.  Aortic Atherosclerosis (ICD10-I70.0). Electronically Signed   By: Ronney Asters M.D.   On: 11/21/2021 21:18   DG Tibia/Fibula Right  Result Date: 11/06/2021 CLINICAL DATA:  Fall, ulcers EXAM: RIGHT TIBIA AND FIBULA - 2 VIEW; RIGHT FOOT - 2 VIEW; LEFT FOOT - 2 VIEW COMPARISON:  None. FINDINGS: Right tibia, fibula and right foot: No evidence of fracture or dislocation. Vascular calcifications. Soft tissue swelling seen about the right first MTP joint. No osseous destruction or periosteal reaction. No significant degenerative changes. Left foot: No evidence fracture or dislocation. Vascular calcifications. Ulcer the superficial soft tissues overlying the first MTP joint. No osseous  destruction or periosteal reaction. No significant degenerative changes. IMPRESSION: No evidence of fracture or osteomyelitis. Electronically Signed   By: Yetta Glassman M.D.   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 EXAM: CT OF THE LOWER LEFT EXTREMITY WITH CONTRAST TECHNIQUE: Multidetector CT imaging of the lower left extremity was performed according to the standard protocol following intravenous contrast administration. CONTRAST:  20mL OMNIPAQUE IOHEXOL 300 MG/ML  SOLN COMPARISON:  None. FINDINGS: Bones/Joint/Cartilage No cortical erosion or periosteal reaction. No evidence of fracture or dislocation. Degenerative changes at the talonavicular and calcaneonavicular joint space subchondral cystic changes and minimal osteophytes. Subtalar and tibiotalar joints are intact. Ligaments Suboptimally assessed by CT. Muscles and Tendons Muscles are normal in bulk. No evidence of intramuscular fluid collection or abscess. Soft tissues There is marked skin thickening and subcutaneous soft tissue edema about the distal aspect of the foot. There is a deep skin wound about the plantar aspect of the first metatarsophalangeal joint without evidence of drainable fluid collection or abscess. IMPRESSION: 1. Skin thickening and subcutaneous soft tissue edema about the forefoot consistent with cellulitis. 2. Deep skin wound about the plantar aspect of the first metatarsophalangeal joint. No cortical erosion or periosteal reaction concerning for osteomyelitis. Evaluation of early osteomyelitis is however limited on CT scans. If there is persistent clinical concern MR examination could be obtained for further evaluation. Electronically Signed   By: Keane Police D.O.   On: 11/23/2021 16:30   CT TIBIA FIBULA RIGHT W CONTRAST  Result Date: 11/23/2021 CLINICAL DATA:  Leg swelling and sores. EXAM: CT OF THE LOWER RIGHT EXTREMITY WITH  CONTRAST TECHNIQUE: Multidetector CT imaging of the lower right extremity was performed according to the standard protocol following intravenous contrast administration. CONTRAST:  60mL OMNIPAQUE IOHEXOL 300 MG/ML  SOLN COMPARISON:  75 cc Omnipaque 300 FINDINGS: Bones/Joint/Cartilage Knee osteoarthritis especially affecting the patellofemoral compartment. No bony destructive findings in the tibial or fibular characteristic of osteomyelitis. Small knee effusion without a substantial degree of synovitis. Posterior meniscal chondrocalcinosis versus free  osteochondral fragment along the posterior horn medial meniscus. Plantar and Achilles calcaneal spurs. Accessory navicular noted. Small Baker's cyst noted. Ligaments Suboptimally assessed by CT. Muscles and Tendons No intramuscular abscess or hematoma. Soft tissues Subcutaneous edema at the level of the knee is primarily anterior and lateral. This tracks distally in the calf and is more confluent in the distal calf and in the ankle region where the appearance is more circumferential. The cause of this subcutaneous edema is nonspecific. No subcutaneous abscess is present. There are scattered irregular subcutaneous calcifications compatible with venous insufficiency. Atherosclerotic calcification of arterial vascular structures also noted. Today's exam is not a CT angiogram and does not adequately assess patency of the arterial vessels distal to the popliteal artery. I do not see a definite large cutaneous ulceration or gas tracking in the subcutaneous tissues. IMPRESSION: 1. No findings of osteomyelitis or drainable abscess. 2. Subcutaneous edema especially in the distal calf and ankle region. Cellulitis not excluded. No gas in the soft tissues. 3. Subcutaneous calcifications along the calf compatible with chronic venous insufficiency. 4. Small knee effusion and small Baker's cyst. No substantial synovitis. 5. Posterior horn medial meniscus chondrocalcinosis versus small  adjacent free osteochondral fragment. 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 6. Plantar and Achilles calcaneal spurs. Electronically Signed   By: Van Clines M.D.   On: 11/23/2021 16:54   MR FOOT RIGHT WO CONTRAST  Result Date: 11/23/2021 CLINICAL DATA:  Foot swelling, diabetic, osteomyelitis suspected. No prior imaging. EXAM: MRI OF THE RIGHT FOREFOOT WITHOUT CONTRAST TECHNIQUE: Multiplanar, multisequence MR imaging of the right was performed. No intravenous contrast was administered. COMPARISON:  Radiographs dated November 21, 2021. FINDINGS: Bones/Joint/Cartilage No fracture or dislocation. Normal alignment. No joint effusion. No marrow signal abnormality. Ligaments Collateral ligaments are intact.  Lisfranc ligament is intact. Muscles and Tendons Flexor, peroneal and extensor compartment tendons are intact. 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 No drainable fluid collection or abscess. Soft tissue Skin thickening and prominent subcutaneous soft tissue edema about the dorsal and plantar aspect of the forefoot. No drainable fluid collection or abscess. No soft tissue mass. IMPRESSION: 1.  No MR evidence of acute osteomyelitis. 2. Skin thickening and subcutaneous soft tissue edema consistent with cellulitis. No drainable fluid collection or abscess. Electronically Signed   By: Keane Police D.O.   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 EXAM: MRI OF THE LEFT FOOT WITHOUT CONTRAST TECHNIQUE: Multiplanar, multisequence MR imaging of the left was performed. No intravenous contrast was administered. COMPARISON:  Radiographs dated November 21, 2021 FINDINGS: Nondiagnostic examination due to significant motion. Subcutaneous soft tissue edema. IMPRESSION: Nondiagnostic examination due to motion. Subcutaneous soft  tissue edema. Electronically Signed   By: Keane Police D.O.   On: 11/23/2021 11:37   US Abdomen Limited  Result Date: 11/22/2021 CLINICAL DATA:  Elevated liver enzymes. Gallbladder surgically absent. EXAM: ULTRASOUND ABDOMEN LIMITED RIGHT UPPER QUADRANT COMPARISON:  CT abdomen and pelvis, 11/21/2021. FINDINGS: Gallbladder: Surgically absent. Common bile duct: Diameter: 4 mm Liver: Suboptimally visualized. Normal overall size. No convincing mass. Portal vein is patent on color Doppler imaging with normal direction of blood flow towards the liver. Other: None. IMPRESSION: 1. Significantly limited study due to the patient's body habitus. 2. No acute findings. No bile duct dilation. Liver not well assessed, better visualized on the previous day's CT. Electronically Signed   By: Shanon Brow  Ormond M.D.   On: 11/22/2021 09:01   US Venous Img Lower Bilateral (DVT)  Result Date: 11/23/2021 CLINICAL DATA:  Bilateral lower extremity chronic pain and edema EXAM: BILATERAL LOWER EXTREMITY VENOUS DOPPLER ULTRASOUND TECHNIQUE: Gray-scale sonography with graded compression, as well as color Doppler and duplex ultrasound were performed to evaluate the lower extremity deep venous systems from the level of the common femoral vein and including the common femoral, femoral, profunda femoral, popliteal and calf veins including the posterior tibial, peroneal and gastrocnemius veins when visible. The superficial great saphenous vein was also interrogated. Spectral Doppler was utilized to evaluate flow at rest and with distal augmentation maneuvers in the common femoral, femoral and popliteal veins. COMPARISON:  None. FINDINGS: RIGHT LOWER EXTREMITY Common Femoral Vein: No evidence of thrombus. Normal compressibility, respiratory phasicity and response to augmentation. Saphenofemoral Junction: No evidence of thrombus. Normal compressibility and flow on color Doppler imaging. Profunda Femoral Vein: No evidence of thrombus. Normal  compressibility and flow on color Doppler imaging. Femoral Vein: No evidence of thrombus. Normal compressibility, respiratory phasicity and response to augmentation. Popliteal Vein: No evidence of thrombus. Normal compressibility, respiratory phasicity and response to augmentation. Calf Veins: No evidence of thrombus. Normal compressibility and flow on color Doppler imaging. Other Findings: Right popliteal fossa minimally complex thick-walled Baker's cyst measures 4.5 x 1.4 x 2.9 cm LEFT LOWER EXTREMITY Common Femoral Vein: No evidence of thrombus. Normal compressibility, respiratory phasicity and response to augmentation. Saphenofemoral Junction: No evidence of thrombus. Normal compressibility and flow on color Doppler imaging. Profunda Femoral Vein: No evidence of thrombus. Normal compressibility and flow on color Doppler imaging. Femoral Vein: No evidence of thrombus. Normal compressibility, respiratory phasicity and response to augmentation. Popliteal Vein: No evidence of thrombus. Normal compressibility, respiratory phasicity and response to augmentation. Calf Veins: No evidence of thrombus. Normal compressibility and flow on color Doppler imaging. IMPRESSION: No significant DVT demonstrated in either extremity. Limited exam because of obesity. 4.5 cm right knee Baker's cyst. Electronically Signed   By: Jerilynn Mages.  Shick M.D.   On: 11/23/2021 14:10   US ARTERIAL ABI (SCREENING LOWER EXTREMITY)  Result Date: 11/23/2021 CLINICAL DATA:  60 year old female with a history bilateral wounds EXAM: NONINVASIVE PHYSIOLOGIC VASCULAR STUDY OF BILATERAL LOWER EXTREMITIES TECHNIQUE: Evaluation of both lower extremities was performed at rest, including calculation of ankle-brachial indices, multiple segmental pressure evaluation, segmental Doppler and segmental pulse volume recording. COMPARISON:  None. FINDINGS: Right ABI:  0.96 Left ABI:  1.35 Right Lower Extremity: Segmental Doppler at the right ankle demonstrates monophasic  waveforms Left Lower Extremity: Segmental Doppler at the left ankle demonstrates monophasic waveforms IMPRESSION: Resting ABI the bilateral lower extremity within normal limits, though could be falsely elevated given the segmental exam. Segmental Doppler at the bilateral ankles demonstrates monophasic waveforms. Signed, Dulcy Fanny. Dellia Nims, RPVI [facility] Radiology Electronically Signed   By: Corrie Mckusick D.O.   On: 11/23/2021 11:21   DG Foot 2 Views Left  Result Date: 11/06/2021 CLINICAL DATA:  Fall, ulcers EXAM: RIGHT TIBIA AND FIBULA - 2 VIEW; RIGHT FOOT - 2 VIEW; LEFT FOOT - 2 VIEW COMPARISON:  None. FINDINGS: Right tibia, fibula and right foot: No evidence of fracture or dislocation. Vascular calcifications. Soft tissue swelling seen about the right first MTP joint. No osseous destruction or periosteal reaction. No significant degenerative changes. Left foot: No evidence fracture or dislocation. Vascular calcifications. Ulcer the superficial soft tissues overlying the first MTP joint. No osseous destruction or periosteal reaction. No  significant degenerative changes. IMPRESSION: No evidence of fracture or osteomyelitis. Electronically Signed   By: Yetta Glassman M.D.   On: 11/06/2021 15:32   DG Foot 2 Views Right  Result Date: 11/06/2021 CLINICAL DATA:  Fall, ulcers EXAM: RIGHT TIBIA AND FIBULA - 2 VIEW; RIGHT FOOT - 2 VIEW; LEFT FOOT - 2 VIEW COMPARISON:  None. FINDINGS: Right tibia, fibula and right foot: No evidence of fracture or dislocation. Vascular calcifications. Soft tissue swelling seen about the right first MTP joint. No osseous destruction or periosteal reaction. No significant degenerative changes. Left foot: No evidence fracture or dislocation. Vascular calcifications. Ulcer the superficial soft tissues overlying the first MTP joint. No osseous destruction or periosteal reaction. No significant degenerative changes. IMPRESSION: No  evidence of fracture or osteomyelitis. Electronically Signed   By: Yetta Glassman M.D.   On: 11/06/2021 15:32   DG Foot Complete Left  Result Date: 11/21/2021 CLINICAL DATA:  Wounds to both feet x2 weeks. EXAM: LEFT FOOT - COMPLETE 3+ VIEW COMPARISON:  November 06, 2021 FINDINGS: There is no evidence of fracture or dislocation. There is no evidence of arthropathy or other focal bone abnormality. A 1.2 cm superficial soft tissue ulceration is seen along the plantar aspect of the distal left foot. Moderate severity soft tissue swelling is also seen within this region. IMPRESSION: Plantar soft tissue ulceration and associated soft tissue swelling, as described above, without an acute osseous abnormality. Electronically Signed   By: Virgina Norfolk M.D.   On: 11/21/2021 21:33   DG Foot Complete Right  Result Date: 11/21/2021 CLINICAL DATA:  Wounds to both feet x2 weeks. EXAM: RIGHT FOOT COMPLETE - 3+ VIEW COMPARISON:  November 06, 2021 FINDINGS: There is no evidence of fracture or dislocation. There is no evidence of arthropathy or other focal bone abnormality. Mild to moderate severity vascular calcification is seen. Mild soft tissue swelling is seen along the plantar aspect of the distal right foot. IMPRESSION: Plantar soft tissue swelling, as described above, without an acute osseous abnormality. Electronically Signed   By: Virgina Norfolk M.D.   On: 11/21/2021 21:31   [place] EKG SITE RITE  Result Date: 11/23/2021 If Site Rite image not attached, placement could not be confirmed due to current cardiac rhythm.   Orson Eva, DO  [facility]  If 7PM-7AM, please contact night-coverage [email or web address] Password [email or web address] 11/24/2021, 3:38 PM   LOS: 3 days

## 2021-11-24 NOTE — NC FL2 (Deleted)
[place] MEDICAID FL2 LEVEL OF CARE SCREENING TOOL     IDENTIFICATION  Patient Name: [name] Birthdate: [birth year] Sex: female Admission Date (Current Location): 11/21/2021  [facility] and [place] Number:  [employer] and Address:  [place] [address], [place]      Provider Number: [id number]  Attending Physician Name and Address:  Orson Eva, MD  Relative Name and Phone Number:       Current Level of Care: Hospital Recommended Level of Care: [place] Prior Approval Number:    Date Approved/Denied:   PASRR Number:    Discharge Plan: SNF    Current Diagnoses: Patient Active Problem List   Diagnosis Date Noted   Open wound of left foot 11/22/2021   Chronic venous stasis 11/22/2021   Leukocytosis 11/22/2021   Thrombocytosis 11/22/2021   Hyperglycemia due to diabetes mellitus ([place]) 11/22/2021   Lactic acidosis 11/22/2021   Hypoalbuminemia due to protein-calorie malnutrition ([place]) 11/22/2021   Elevated liver enzymes 11/22/2021   Mixed hyperlipidemia 11/22/2021   GERD (gastroesophageal reflux disease) 11/22/2021   Chronic diastolic CHF (congestive heart failure) ([place]) 11/22/2021   Diabetic foot infection ([place]) 11/22/2021   Uncontrolled type 2 diabetes mellitus with hyperglycemia, with long-term current use of insulin ([place]) 11/22/2021   Lower extremity cellulitis 11/21/2021   Morbid obesity with BMI of 50.0-59.9, adult ([place]) 01/29/2020   Educated about COVID-19 virus infection 01/29/2020   OSA (obstructive sleep apnea) 12/21/2019   Asthma 97/67/3419   Diastolic dysfunction 37/90/2409   Prolonged QT interval 05/14/2015   Palpitations 07/23/2014   Generalized weakness 07/23/2014   Diabetes mellitus ([place]) 07/22/2014   Essential hypertension 07/22/2014   COPD (chronic obstructive pulmonary disease) ([place]) 07/22/2014   Depression 07/22/2014    Orientation RESPIRATION BLADDER Height & Weight      Self, Time, Situation, Place  Normal Continent Weight: 293 lb 3.4 oz (133 kg) Height:  5\' 1"  (154.9 cm)  BEHAVIORAL SYMPTOMS/MOOD NEUROLOGICAL BOWEL NUTRITION STATUS      Continent Diet (see dc summary)  AMBULATORY STATUS COMMUNICATION OF NEEDS Skin   Extensive Assist Verbally PU Stage and Appropriate Care (Stage 3 Left Lower Leg, Stage 2 Left Knee, L Hip Unstageable, Non pressure wound R Toe and L Toe)   PU Stage 2 Dressing: Daily PU Stage 3 Dressing: Daily                 Personal Care Assistance Level of Assistance  Bathing, Feeding, Dressing Bathing Assistance: Maximum assistance Feeding assistance: Independent Dressing Assistance: Limited assistance     Functional Limitations Info  Sight, Hearing, Speech Sight Info: Adequate Hearing Info: Adequate Speech Info: Adequate    SPECIAL CARE FACTORS FREQUENCY  PT (By licensed PT), OT (By licensed OT)     PT Frequency: 5x week OT Frequency: 3x week            Contractures Contractures Info: Not present    Additional Factors Info  Code Status, Allergies, Psychotropic Code Status Info: Full Allergies Info: Carvedilol, Benicar, Codeine, Sulfa Antibiotics, Trulicity Psychotropic Info: Zoloft         Current Medications (11/24/2021):  This is the current hospital active medication list Current Facility-Administered Medications  Medication Dose Route Frequency Provider Last Rate Last Admin   albuterol (PROVENTIL) (2.5 MG/3ML) 0.083% nebulizer solution 3 mL  3 mL Inhalation Q6H PRN Adefeso, Oladapo, DO       cefTRIAXone (ROCEPHIN) 2 g in sodium chloride 0.9 % 100 mL  IVPB  2 g Intravenous Q24H Tat, Shanon Brow, MD 200 mL/hr at 11/24/21 0940 2 g at 11/24/21 0940   enoxaparin (LOVENOX) injection 65 mg  65 mg Subcutaneous Q24H Tat, Shanon Brow, MD   65 mg at 11/24/21 0930   famotidine (PEPCID) tablet 20 mg  20 mg Oral BID PRN Adefeso, Oladapo, DO       feeding supplement (GLUCERNA SHAKE) (GLUCERNA SHAKE) liquid 237 mL  237 mL Oral TID  BM Adefeso, Oladapo, DO   237 mL at 11/24/21 0940   gabapentin (NEURONTIN) capsule 100 mg  100 mg Oral BID Adefeso, Oladapo, DO   100 mg at 11/24/21 5361   Gerhardt's butt cream   Topical TID Tat, Shanon Brow, MD       influenza vac split quadrivalent PF (FLUARIX) injection 0.5 mL  0.5 mL Intramuscular Tomorrow-1000 Adefeso, Oladapo, DO       insulin aspart (novoLOG) injection 0-20 Units  0-20 Units Subcutaneous TID WC Tat, Shanon Brow, MD   3 Units at 11/24/21 0900   insulin aspart (novoLOG) injection 0-5 Units  0-5 Units Subcutaneous QHS Orson Eva, MD   2 Units at 11/23/21 2146   insulin glargine-yfgn (SEMGLEE) injection 20 Units  20 Units Subcutaneous QHS Orson Eva, MD   20 Units at 11/23/21 2141   lactated ringers infusion   Intravenous Continuous Tat, Shanon Brow, MD 125 mL/hr at 11/24/21 0929 New Bag at 11/24/21 0929   liver oil-zinc oxide (DESITIN) 40 % ointment   Topical TID Orson Eva, MD   Given at 11/24/21 0930   nystatin (MYCOSTATIN/NYSTOP) topical powder   Topical BID Orson Eva, MD   Given at 11/24/21 0929   pantoprazole (PROTONIX) EC tablet 40 mg  40 mg Oral Daily Adefeso, Oladapo, DO   40 mg at 11/24/21 4431   prochlorperazine (COMPAZINE) injection 5 mg  5 mg Intravenous Q6H PRN Adefeso, Oladapo, DO       vancomycin (VANCOREADY) IVPB 1250 mg/250 mL  1,250 mg Intravenous Q24H Erenest Blank, RPH 166.7 mL/hr at 11/23/21 2027 1,250 mg at 11/23/21 2027     Discharge Medications: Please see discharge summary for a list of discharge medications.  Relevant Imaging Results:  Relevant Lab Results:   Additional Information SSN: [id number], Pt will need IV antibiotics for osteomyelitis  Shade Flood, LCSW

## 2021-11-25 ENCOUNTER — Ambulatory Visit (HOSPITAL_COMMUNITY)
Admit: 2021-11-25 | Discharge: 2021-11-25 | Disposition: A | Discharge status: Home / Self Care | Payer: Medicare Other | Attending: Internal Medicine | Admitting: Internal Medicine

## 2021-11-25 DIAGNOSIS — K219 Gastro-esophageal reflux disease without esophagitis: Secondary | ICD-10-CM

## 2021-11-25 DIAGNOSIS — D75839 Thrombocytosis, unspecified: Secondary | ICD-10-CM

## 2021-11-25 DIAGNOSIS — R748 Abnormal levels of other serum enzymes: Secondary | ICD-10-CM

## 2021-11-25 DIAGNOSIS — L0390 Cellulitis, unspecified: Secondary | ICD-10-CM | POA: Insufficient documentation

## 2021-11-25 DIAGNOSIS — M869 Osteomyelitis, unspecified: Secondary | ICD-10-CM | POA: Insufficient documentation

## 2021-11-25 DIAGNOSIS — D509 Iron deficiency anemia, unspecified: Secondary | ICD-10-CM

## 2021-11-25 LAB — CBC
HCT: 34.1 % — ABNORMAL LOW (ref 36.0–46.0)
Hemoglobin: 11 g/dL — ABNORMAL LOW (ref 12.0–15.0)
MCH: 26.6 pg (ref 26.0–34.0)
MCHC: 32.3 g/dL (ref 30.0–36.0)
MCV: 82.6 fL (ref 80.0–100.0)
Platelets: 330 10*3/uL (ref 150–400)
RBC: 4.13 MIL/uL (ref 3.87–5.11)
RDW: 15.5 % (ref 11.5–15.5)
WBC: 10.9 10*3/uL — ABNORMAL HIGH (ref 4.0–10.5)
nRBC: 0 % (ref 0.0–0.2)

## 2021-11-25 LAB — GLUCOSE, CAPILLARY
Glucose-Capillary: 219 mg/dL — ABNORMAL HIGH (ref 70–99)
Glucose-Capillary: 300 mg/dL — ABNORMAL HIGH (ref 70–99)
Glucose-Capillary: 317 mg/dL — ABNORMAL HIGH (ref 70–99)
Glucose-Capillary: 214 mg/dL — ABNORMAL HIGH (ref 70–99)

## 2021-11-25 LAB — PROTIME-INR
INR: 1.3 — ABNORMAL HIGH (ref 0.8–1.2)
Prothrombin Time: 16.4 seconds — ABNORMAL HIGH (ref 11.4–15.2)

## 2021-11-25 LAB — FERRITIN: Ferritin: 75 ng/mL (ref 11–307)

## 2021-11-25 LAB — COMPREHENSIVE METABOLIC PANEL
ALT: 115 U/L — ABNORMAL HIGH (ref 0–44)
AST: 224 U/L — ABNORMAL HIGH (ref 15–41)
Albumin: 2.2 g/dL — ABNORMAL LOW (ref 3.5–5.0)
Alkaline Phosphatase: 144 U/L — ABNORMAL HIGH (ref 38–126)
Anion gap: 8 (ref 5–15)
BUN: 18 mg/dL (ref 6–20)
CO2: 27 mmol/L (ref 22–32)
Calcium: 8.3 mg/dL — ABNORMAL LOW (ref 8.9–10.3)
Chloride: 103 mmol/L (ref 98–111)
Creatinine, Ser: 1.2 mg/dL — ABNORMAL HIGH (ref 0.44–1.00)
GFR, Estimated: 52 mL/min — ABNORMAL LOW (ref >60)
Glucose, Bld: 260 mg/dL — ABNORMAL HIGH (ref 70–99)
Potassium: 3.5 mmol/L (ref 3.5–5.1)
Sodium: 138 mmol/L (ref 135–145)
Total Bilirubin: 0.4 mg/dL (ref 0.3–1.2)
Total Protein: 6.3 g/dL — ABNORMAL LOW (ref 6.5–8.1)

## 2021-11-25 LAB — IRON AND TIBC
Iron: 15 ug/dL — ABNORMAL LOW (ref 28–170)
Saturation Ratios: 8 % — ABNORMAL LOW (ref 10.4–31.8)
TIBC: 183 ug/dL — ABNORMAL LOW (ref 250–450)
UIBC: 168 ug/dL

## 2021-11-25 LAB — VANCOMYCIN, PEAK: Vancomycin Pk: 38 ug/mL (ref 30–40)

## 2021-11-25 IMAGING — XA IR PICC >5YO
1 series · 2 of 2 positions shown · non-contrast
Comparison: none

INDICATION: Patient with history of osteomyelitis left foot, cellulitis
requiring long-term IV antibiotic therapy. Request to IR for PICC
placement for durable venous access.

[Series 300: dsa body · 2 of 2 slices shown]
[im 1/2]
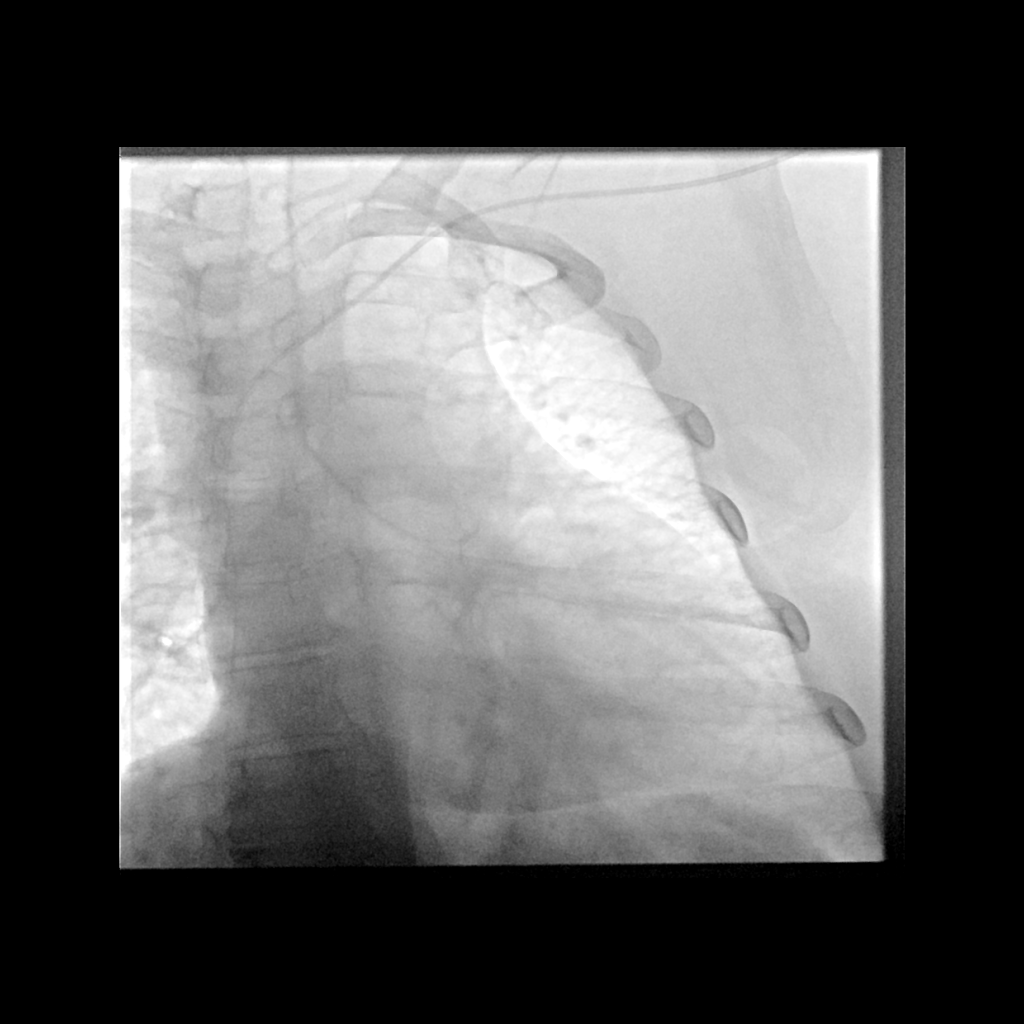
[im 2/2]
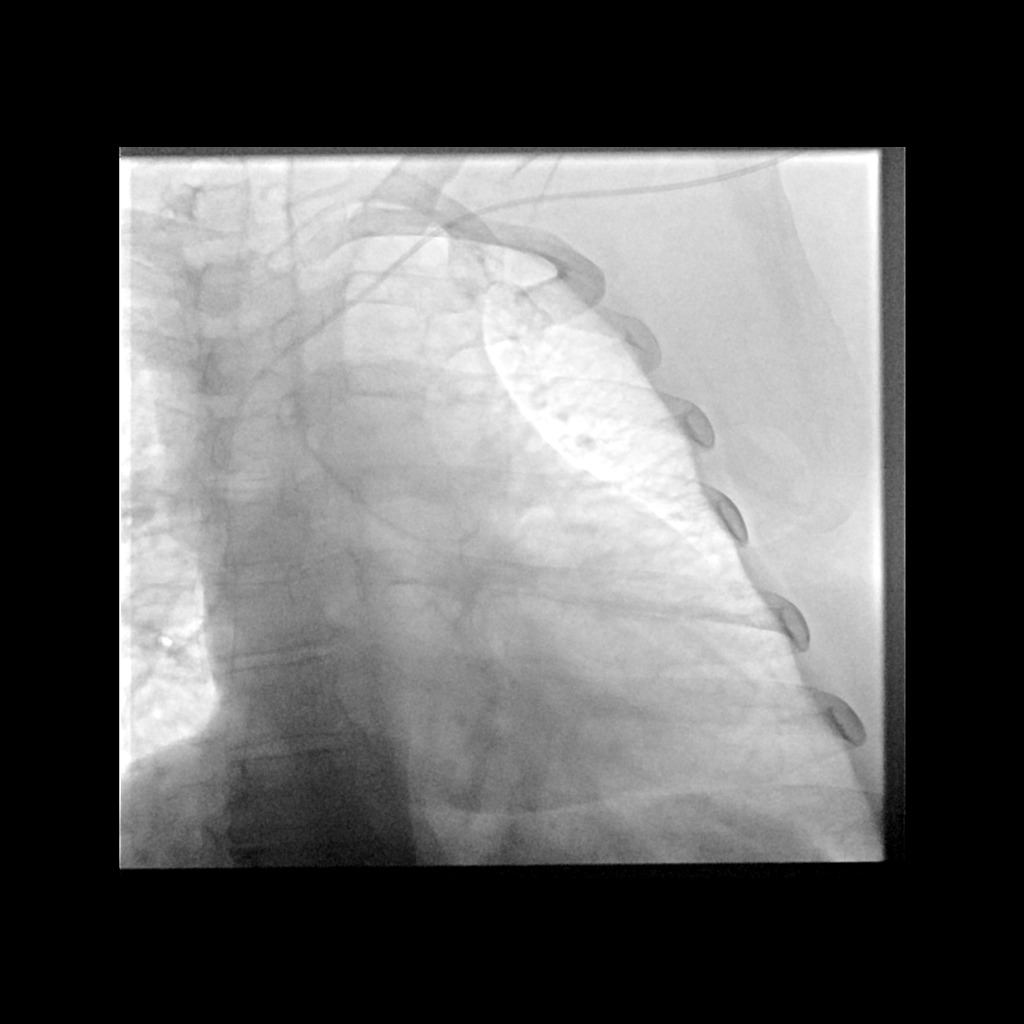

[2 of 2 positions shown; findings below may reference images not displayed]

EXAM:
LEFT UPPER EXTREMITY PICC LINE PLACEMENT WITH ULTRASOUND AND
FLUOROSCOPIC GUIDANCE

MEDICATIONS:
3 mL 1% lidocaine

ANESTHESIA/SEDATION:
None.

FLUOROSCOPY TIME:  Fluoroscopy Time: 0 minutes 24 seconds (16.5
mGy).

COMPLICATIONS:
None immediate.

PROCEDURE:
The patient was advised of the possible risks and complications and
agreed to undergo the procedure. The patient was then brought to the
angiographic suite for the procedure.

The left arm was prepped with chlorhexidine, draped in the usual
sterile fashion using maximum barrier technique (cap and mask,
sterile gown, sterile gloves, large sterile sheet, hand hygiene and
cutaneous antisepsis) and infiltrated locally with 1% Lidocaine.

Ultrasound demonstrated patency of the left basilic vein, and this
was documented with an image. Under real-time ultrasound guidance,
this vein was accessed with a 21 gauge micropuncture needle and
image documentation was performed. A [DATE] wire was introduced in to
the vein. Over this, a 5 French single lumen power injectable PICC
was advanced to the lower SVC/right atrial junction. Fluoroscopy
during the procedure and fluoro spot radiograph confirms appropriate
catheter position. The catheter was flushed, aspirated, and covered
with a sterile dressing.

Catheter length: 42 cm
IMPRESSION: Successful left arm power PICC line placement with ultrasound and
fluoroscopic guidance. The catheter is ready for use.

Read by RU

## 2021-11-25 MED ORDER — SERTRALINE HCL 50 MG PO TABS
150.0000 mg | ORAL_TABLET | Freq: Every day | ORAL | Status: DC
  Administered 2021-11-25 – 2021-11-27 (×3): 150 mg via ORAL
  Filled 2021-11-25 (×3): qty 3

## 2021-11-25 MED ORDER — HYDROXYZINE HCL 10 MG PO TABS
10.0000 mg | ORAL_TABLET | Freq: Three times a day (TID) | ORAL | Status: DC | PRN
  Filled 2021-11-25: qty 1

## 2021-11-25 MED ORDER — PROPRANOLOL HCL ER 80 MG PO CP24
80.0000 mg | ORAL_CAPSULE | Freq: Every day | ORAL | Status: DC
  Administered 2021-11-25 – 2021-11-26 (×2): 80 mg via ORAL
  Filled 2021-11-25 (×7): qty 1

## 2021-11-25 MED ORDER — POVIDONE-IODINE 10 % EX SOLN
CUTANEOUS | Status: AC
  Filled 2021-11-25: qty 14.8

## 2021-11-25 MED ORDER — SERTRALINE HCL 50 MG PO TABS: 100.0000 mg | ORAL_TABLET | Freq: Every day | ORAL | Status: DC

## 2021-11-25 MED ORDER — MAGNESIUM OXIDE -MG SUPPLEMENT 400 (240 MG) MG PO TABS
400.0000 mg | ORAL_TABLET | Freq: Every day | ORAL | Status: DC
  Administered 2021-11-25 – 2021-11-27 (×3): 400 mg via ORAL
  Filled 2021-11-25 (×3): qty 1

## 2021-11-25 MED ORDER — HEPARIN SOD (PORK) LOCK FLUSH 100 UNIT/ML IV SOLN
INTRAVENOUS | Status: AC
  Filled 2021-11-25: qty 5

## 2021-11-25 MED ORDER — POTASSIUM CHLORIDE CRYS ER 20 MEQ PO TBCR
20.0000 meq | EXTENDED_RELEASE_TABLET | Freq: Two times a day (BID) | ORAL | Status: DC
Start: 2021-11-25 — End: 2021-11-27
  Administered 2021-11-25 – 2021-11-27 (×5): 20 meq via ORAL
  Filled 2021-11-25 (×5): qty 1

## 2021-11-25 MED ORDER — LIDOCAINE HCL (PF) 1 % IJ SOLN
INTRAMUSCULAR | Status: DC | PRN
  Administered 2021-11-25: 5 mL

## 2021-11-25 MED ORDER — INSULIN ASPART 100 UNIT/ML IJ SOLN: 12.0000 [IU] | Freq: Three times a day (TID) | INTRAMUSCULAR | Status: DC

## 2021-11-25 MED ORDER — INSULIN GLARGINE-YFGN 100 UNIT/ML ~~LOC~~ SOLN
40.0000 [IU] | Freq: Every day | SUBCUTANEOUS | Status: DC
  Administered 2021-11-25: 23:00:00 40 [IU] via SUBCUTANEOUS
  Filled 2021-11-25 (×2): qty 0.4

## 2021-11-25 MED ORDER — CHLORHEXIDINE GLUCONATE CLOTH 2 % EX PADS
6.0000 | MEDICATED_PAD | Freq: Every day | CUTANEOUS | Status: DC
  Administered 2021-11-26 – 2021-11-27 (×2): 6 via TOPICAL

## 2021-11-25 MED ORDER — LIDOCAINE HCL 1 % IJ SOLN
INTRAMUSCULAR | Status: AC
  Filled 2021-11-25: qty 20

## 2021-11-25 MED ORDER — TORSEMIDE 20 MG PO TABS
20.0000 mg | ORAL_TABLET | Freq: Two times a day (BID) | ORAL | Status: DC
  Administered 2021-11-26 – 2021-11-27 (×3): 20 mg via ORAL
  Filled 2021-11-25 (×4): qty 1

## 2021-11-25 MED ORDER — SERTRALINE HCL 50 MG PO TABS
50.0000 mg | ORAL_TABLET | Freq: Every day | ORAL | Status: DC
Start: 2021-11-25 — End: 2021-11-25

## 2021-11-25 MED ORDER — ATORVASTATIN CALCIUM 10 MG PO TABS
10.0000 mg | ORAL_TABLET | Freq: Every day | ORAL | Status: DC
  Administered 2021-11-25 – 2021-11-26 (×2): 10 mg via ORAL
  Filled 2021-11-25 (×2): qty 1

## 2021-11-25 MED ORDER — VANCOMYCIN HCL IN DEXTROSE 1-5 GM/200ML-% IV SOLN
1000.0000 mg | INTRAVENOUS | Status: DC
  Administered 2021-11-25 – 2021-11-26 (×2): 1000 mg via INTRAVENOUS
  Filled 2021-11-25 (×2): qty 200

## 2021-11-25 NOTE — Progress Notes (Signed)
Pharmacy Antibiotic Note  [name] is a 60 y.o. female admitted on 11/21/2021 with  wound infection .  Pharmacy has been consulted for Vancomycin dosing.  12/21 AM update:  AUC above goal at 651 Scr improving some (1.54>1.36>1.2)  Plan: Dec vancomycin to 1000 mg IV q24h >>>New estimated AUC: 521 Re-check levels as needed   Height: 5\' 1"  (154.9 cm) Weight: 133 kg (293 lb 3.4 oz) IBW/kg (Calculated) : 47.8  Temp (24hrs), Avg:97.9 F (36.6 C), Min:97.7 F (36.5 C), Max:98.2 F (36.8 C)  Recent Labs  Lab 11/21/21 1634 11/21/21 2245 11/22/21 0038 11/22/21 0449 11/23/21 0411 11/24/21 0527 11/24/21 2208 11/25/21 0219  WBC 18.8*  --   --  15.9* 12.5* 11.3*  --  10.9*  CREATININE 1.27*  --   --  1.48* 1.54* 1.36*  --  1.20*  LATICACIDVEN  --  2.0* 1.8  --   --   --   --   --   VANCOTROUGH  --   --   --   --   --   --  16  --   VANCOPEAK  --   --   --   --   --   --   --  38     Estimated Creatinine Clearance: 64.5 mL/min (A) (by C-G formula based on SCr of 1.2 mg/dL (H)).    Allergies  Allergen Reactions   Carvedilol Palpitations   Benicar [Olmesartan] Swelling   Codeine Other (See Comments)    Confusion    Sulfa Antibiotics Swelling    Whole face swells   Trulicity [Dulaglutide] Diarrhea    Narda Bonds, PharmD, BCPS Clinical Pharmacist Phone: [phone]

## 2021-11-25 NOTE — Progress Notes (Signed)
PROGRESS NOTE  [name] WGN:[id number] DOB: [birth year] DOA: 11/21/2021 PCP: Bridget Hartshorn, NP  Brief History:  60 year old female with a history of OSA, diabetes mellitus type 2, hypertension, depression, morbid obesity presenting with generalized weakness with associated nausea and vomiting for the past 2 days.  Unfortunately, the patient is a difficult historian at best.  In addition, I also try to contact the patient's spouse at home who is also a difficult historian at best. As best as I was able to decipher, it appears that the patient has had generalized weakness, dizziness, and nausea for the better part of the least the past 2 weeks.  It appears that for the past 5-6 days her symptoms have worsened to the point where she has been essentially unable to get out of bed.  Her spouse states that he has had to help her get out of bed, but he is having difficulty due to his own medical issues.  Occasionally, the patient has been soiling herself in bed.  Prior to her acute illness, the patient was able to make transfers and walk with a walker, but her functional status has significantly declined over the past 2 weeks.  The patient endorses nausea and vomiting for the past 2 days prior to admission.  She has had some loose stools but denies any frank diarrhea, hematochezia, melena.  She denies any chest pain, shortness breath, coughing, hemoptysis.  There is been no fevers, chills, headache, neck pain.   Apparently, the patient visited the emergency department on 11/06/2021 for lower extremity pain.  She was diagnosed with cellulitis and sent home with doxycycline which she has taken.  She states that she chronically has lower extremity pain for which she takes gabapentin.  She is not really able to clarify whether she has had any worsening drainage, erythema, or edema in her bilateral legs.  Unfortunately her husband cannot clarify the situation any further.  When asked about  previous wound care, the patient and spouse do not remember have any chronic wound care, but review of the medical record shows that the patient has been to the wound care center to see Dr. Dellia Nims back in November 2021.   Lastly, the patient and spouse seem to have some poor insight regarding her the patient's insulin usage.  Spouse has to assist the patient with her insulin, but he also states that the patient takes her medications independently.  There is much conflicting information regarding exactly how much insulin the patient is taking.  However review of the medical record shows that she had a recent PCP visit for which was documented that she was taking 70/30 insulin 100 units twice daily.  The patient currently states that she takes 70/3060 units twice daily with Humalog R in the mornings and with supper.  He is not really able to clarify exactly how much Humalog she takes.   In the emergency department, the patient was afebrile hemodynamically stable with oxygen saturation 99% room air.  BMP showed sodium 141, potassium 4.0, bicarbonate 32, serum creatinine 1.27.  AST is 177, ALT 55, alkaline phosphatase 118, total bilirubin 1.1.  WBC 18.8, hemoglobin 14.5, platelets 450,000.  Lactic acid 2.0.  The patient was started on vancomycin.  She was given a 1 L bolus of normal saline.    Assessment/Plan: Diabetic foot infection/Osteomyelitis left foot -Continue vancomycin and ceftriaxone -Check ABIs>>Right ABI:  0.96;  Left ABI:  1.35 -Wound care consult  for numerous lower extremity wounds -ESR 85 -CRP--21.5 -Personally reviewed x-rays of the foot--no erosions noted -MR Right Foot>>no osteomyelitis, no abscess -MR LEFT Foot>>limited by movement -try CT of LEFT FOOT--No cortical erosion or periosteal reaction concerning for osteomyelitis; no abscess -general surgery consult appreciated>>debrided left foot ulcer -CT right tib/fib--no abscess, no osteo; sm knee effusion -despite no radiographic  evidence of osteomyelitis>>wound probes to bone = contiguous focus osteomyelitis -Vascular Access RN unsuccessful PICC placement>>IR consult for PICC -plan vanc/ceftrixone x 4 weeks (last day 12/20/21) -then po doxy+amox/clav x 2 weeks thereafter (start 12/21/21) -pt agrees for SNF for wound care and abx    Uncontrolled diabetes mellitus type 2 with hyperglycemia -The patient had anion gap 12 with ketonuria but normal bicarbonate -Continue IV fluids -Increase Semglee to 40 units -Resistant sliding scale -12/17  A1c--9.9 -increasing novolog 12 units with meals   Abdominal wall cellulitis/Cellulitis leg -Antibiotics as discussed above -Wound care consult appreciated -overall improved   Transaminasemia -Holding statin -suspect hepatic steatosis given morbid obesity  -11/21/2021 CT abd--skin thickening left anterior abdominal wall without abscess.  Mild splenomegaly.  Atrophic pancreas.  Status postcholecystectomy.  No bowel wall thickening. -RUQ ultrasound--nondiagnostic due to body habitus -Viral hepatitis panel negative -Urine drug screen -EBV DNA--pending -CMV DNA--neg -GI consulted   Acute kidney injury -Baseline creatinine 0.8-1.1 -Serum creatinine peaked at 1.54 -improving with IVF>>continue   Pyuria -UA 11-20 WBC -urine culture was not obtained   Acute metabolic encephalopathy -The patient remains confused intially -Secondary to infectious process -overall improved   Lactic acidosis -Secondary to volume depletion -Started IV fluids   GERD -continue PPI   Morbid Obesity -BMI 55.40 -lifestyle modification   Hypokalemia -repleted -check mag--1.8   Family Communication:   spouse updated  at bedside 12/20, 12/21   Consultants:  general surgery, spoke with Dr. Arnoldo Morale on 12/21 for updates  Code Status:  FULL   DVT Prophylaxis:   Lovenox   Procedures: As Listed in Progress Note Above  Antibiotics: None  Subjective: Vanc 12/17>> Ceftriaxone  12/18>>   Pt back from successfully placed IR PICC line and tolerated procedure well.    Objective: Vitals:   11/24/21 2123 11/24/21 2145 11/25/21 0434 11/25/21 1348  BP: (!) 193/91 (!) 180/85 (!) 177/78 (!) 162/83  Pulse: 95  89 84  Resp: _0 Temp: 98.2 F (36.8 C)  98.4 F (36.9 C) 98.2 F (36.8 C)  TempSrc: Oral   Oral  SpO2: (!) 88% 100% 99% 100%  Weight:      Height:        Intake/Output Summary (Last 24 hours) at 11/25/2021 1437 Last data filed at 11/25/2021 9741 Gross per 24 hour  Intake 3209.89 ml  Output 1250 ml  Net 1959.89 ml   Weight change:  Exam:  General:  awake, morbidly obese, cooperative, Pt is alert, follows commands appropriately, not in acute distress HEENT: No icterus, No thrush, No neck mass, [place]/AT Cardiovascular: RRR, S1/S2, no rubs, no gallops Respiratory: CTA bilaterally, no wheezing, no crackles, no rhonchi Abdomen: Soft/+BS, non tender, non distended, no guarding Extremities: left first plantar MTPJ area healing well after debridement;  erythema of right calf without necrosis or crepitance  Data Reviewed: I have personally reviewed following labs and imaging studies Basic Metabolic Panel: Recent Labs  Lab 11/21/21 1634 11/22/21 0449 11/23/21 0411 11/24/21 0527 11/25/21 0219  NA 141 140 138 142 138  K 4.0 3.9 3.4* 3.1* 3.5  CL 97* 100 100 104 103  CO2 32 _0 GLUCOSE 254* 293* 145* 124* 260*  BUN 18 22* 25* 18 18  CREATININE 1.27* 1.48* 1.54* 1.36* 1.20*  CALCIUM 9.0 8.4* 8.4* 8.6* 8.3*  MG 2.0 2.1  --  1.8  --   PHOS  --  5.1*  --   --   --    Liver Function Tests: Recent Labs  Lab 11/21/21 1634 11/22/21 0449 11/23/21 0411 11/24/21 0527 11/25/21 0219  AST 177* 286* 258* 280* 224*  ALT 55* 94* 106* 120* 115*  ALKPHOS 118 115 113 116 144*  BILITOT 1.1 0.9 0.5 0.6 0.4  PROT 7.0 6.3* 6.3* 6.4* 6.3*  ALBUMIN 2.6* 2.5* 2.4* 2.2* 2.2*   Recent Labs  Lab 11/21/21 1634  LIPASE 18   No results for input(s):  AMMONIA in the last 168 hours. Coagulation Profile: No results for input(s): INR, PROTIME in the last 168 hours. CBC: Recent Labs  Lab 11/21/21 1634 11/22/21 0449 11/23/21 0411 11/24/21 0527 11/25/21 0219  WBC 18.8* 15.9* 12.5* 11.3* 10.9*  NEUTROABS 16.7*  --   --   --   --   HGB 14.5 12.5 11.8* 11.5* 11.0*  HCT 45.3 39.1 36.2 36.2 34.1*  MCV 80.9 82.1 80.4 81.2 82.6  PLT 458* 416* 396 371 330   Cardiac Enzymes: No results for input(s): CKTOTAL, CKMB, CKMBINDEX, TROPONINI in the last 168 hours. BNP: Invalid input(s): POCBNP CBG: Recent Labs  Lab 11/24/21 1106 11/24/21 1603 11/24/21 2149 11/25/21 0443 11/25/21 0710  GLUCAP 224* 309* 259* 219* 300*   HbA1C: No results for input(s): HGBA1C in the last 72 hours.  Urine analysis:    Component Value Date/Time   COLORURINE AMBER (A) 11/21/2021 1626   APPEARANCEUR CLOUDY (A) 11/21/2021 1626   LABSPEC 1.016 11/21/2021 1626   PHURINE 5.0 11/21/2021 1626   GLUCOSEU 50 (A) 11/21/2021 1626   HGBUR LARGE (A) 11/21/2021 1626   BILIRUBINUR NEGATIVE 11/21/2021 1626   KETONESUR 5 (A) 11/21/2021 1626   PROTEINUR >=300 (A) 11/21/2021 1626   UROBILINOGEN 0.2 05/15/2015 0112   NITRITE NEGATIVE 11/21/2021 1626   LEUKOCYTESUR NEGATIVE 11/21/2021 1626   Recent Results (from the past 240 hour(s))  Blood culture (routine x 2)     Status: None (Preliminary result)   Collection Time: 11/21/21 10:45 PM   Specimen: BLOOD RIGHT HAND  Result Value Ref Range Status   Specimen Description   Final    BLOOD RIGHT HAND BOTTLES DRAWN AEROBIC AND ANAEROBIC   Special Requests Blood Culture adequate volume  Final   Culture   Final    NO GROWTH 2 DAYS Performed at [facility], [address], [place], [place] [address]    Report Status PENDING  Incomplete  Blood culture (routine x 2)     Status: None (Preliminary result)   Collection Time: 11/21/21 10:54 PM   Specimen: BLOOD LEFT HAND  Result Value Ref Range Status   Specimen Description    Final    BLOOD LEFT HAND BOTTLES DRAWN AEROBIC AND ANAEROBIC   Special Requests Blood Culture adequate volume  Final   Culture   Final    NO GROWTH 2 DAYS Performed at [facility], [address], [place], [place] [address]    Report Status PENDING  Incomplete  Resp Panel by RT-PCR (Flu A&B, Covid) Nasopharyngeal Swab     Status: None   Collection Time: 11/21/21 11:09 PM   Specimen: Nasopharyngeal Swab; Nasopharyngeal(NP) swabs in vial transport medium  Result Value Ref  Range Status   SARS Coronavirus 2 by RT PCR NEGATIVE NEGATIVE Final    Comment: (NOTE) SARS-CoV-2 target nucleic acids are NOT DETECTED.  The SARS-CoV-2 RNA is generally detectable in upper respiratory specimens during the acute phase of infection. The lowest concentration of SARS-CoV-2 viral copies this assay can detect is 138 copies/mL. A negative result does not preclude SARS-Cov-2 infection and should not be used as the sole basis for treatment or other patient management decisions. A negative result may occur with  improper specimen collection/handling, submission of specimen other than nasopharyngeal swab, presence of viral mutation(s) within the areas targeted by this assay, and inadequate number of viral copies(<138 copies/mL). A negative result must be combined with clinical observations, patient history, and epidemiological information. The expected result is Negative.  Fact Sheet for Patients:  EntrepreneurPulse.com.au  Fact Sheet for Healthcare Providers:  IncredibleEmployment.be  This test is no t yet approved or cleared by the [place] FDA and  has been authorized for detection and/or diagnosis of SARS-CoV-2 by FDA under an Emergency Use Authorization (EUA). This EUA will remain  in effect (meaning this test can be used) for the duration of the COVID-19 declaration under Section 564(b)(1) of the Act, 21 U.S.C.section 360bbb-3(b)(1), unless the authorization  is terminated  or revoked sooner.       Influenza A by PCR NEGATIVE NEGATIVE Final   Influenza B by PCR NEGATIVE NEGATIVE Final    Comment: (NOTE) The Xpert Xpress SARS-CoV-2/FLU/RSV plus assay is intended as an aid in the diagnosis of influenza from Nasopharyngeal swab specimens and should not be used as a sole basis for treatment. Nasal washings and aspirates are unacceptable for Xpert Xpress SARS-CoV-2/FLU/RSV testing.  Fact Sheet for Patients: EntrepreneurPulse.com.au  Fact Sheet for Healthcare Providers: IncredibleEmployment.be  This test is not yet approved or cleared by the [place] FDA and has been authorized for detection and/or diagnosis of SARS-CoV-2 by FDA under an Emergency Use Authorization (EUA). This EUA will remain in effect (meaning this test can be used) for the duration of the COVID-19 declaration under Section 564(b)(1) of the Act, 21 U.S.C. section 360bbb-3(b)(1), unless the authorization is terminated or revoked.  Performed at [facility], [address], [place], [place] [address]      Scheduled Meds:  amLODipine  5 mg Oral Daily   atorvastatin  10 mg Oral QHS   enoxaparin (LOVENOX) injection  65 mg Subcutaneous Q24H   feeding supplement (GLUCERNA SHAKE)  237 mL Oral TID BM   gabapentin  100 mg Oral BID   Gerhardt's butt cream   Topical TID   hydrALAZINE  25 mg Oral Q8H   influenza vac split quadrivalent PF  0.5 mL Intramuscular Tomorrow-1000   insulin aspart  0-20 Units Subcutaneous TID WC   insulin aspart  0-5 Units Subcutaneous QHS   insulin aspart  12 Units Subcutaneous TID WC   insulin glargine-yfgn  40 Units Subcutaneous QHS   liver oil-zinc oxide   Topical TID   magnesium oxide  400 mg Oral Daily   nystatin   Topical BID   pantoprazole  40 mg Oral Daily   potassium chloride SA  20 mEq Oral BID   propranolol ER  80 mg Oral Daily   sertraline  150 mg Oral Daily   torsemide  20 mg Oral BID    Continuous Infusions:  cefTRIAXone (ROCEPHIN)  IV 2 g (11/25/21 0800)   lactated ringers 125 mL/hr at 11/25/21 0517   vancomycin  Procedures/Studies: CT ABDOMEN PELVIS WO CONTRAST  Result Date: 11/21/2021 CLINICAL DATA:  Left lower quadrant abdominal pain. EXAM: CT ABDOMEN AND PELVIS WITHOUT CONTRAST TECHNIQUE: Multidetector CT imaging of the abdomen and pelvis was performed following the standard protocol without IV contrast. COMPARISON:  CT abdomen and pelvis 09/24/2017. FINDINGS: Lower chest: No acute abnormality. Hepatobiliary: No focal liver abnormality is seen. Status post cholecystectomy. Pancreas: Atrophic. Spleen: Mildly enlarged, unchanged. Adrenals/Urinary Tract: There is a 16 mm cyst in the right kidney. Otherwise, the adrenal glands, kidneys and bladder are within normal limits. Stomach/Bowel: Stomach is within normal limits. There is distal esophageal wall thickening. Appendix appears normal. No evidence of bowel wall thickening, distention, or inflammatory changes. Vascular/Lymphatic: Aortic atherosclerosis. No enlarged abdominal or pelvic lymph nodes. Reproductive: Uterus and bilateral adnexa are unremarkable. Other: There is no ascites or focal abdominal wall hernia. There is mild body wall edema. There is skin thickening measuring up to 16 mm in the left anterior abdominal wall. No focal fluid collection or soft tissue gas. Musculoskeletal: Multilevel degenerative changes affect the spine IMPRESSION: 1. Skin thickening in the left anterior abdominal wall. Correlate clinically for infection. No evidence for abscess or soft tissue gas. 2. Mild splenomegaly. 3. Wall thickening of the distal esophagus concerning for esophagitis. Recommend clinical correlation follow-up to exclude underlying lesion. 4.  Aortic Atherosclerosis (ICD10-I70.0). Electronically Signed   By: Ronney Asters M.D.   On: 11/21/2021 21:18   DG Tibia/Fibula Right  Result Date: 11/06/2021 CLINICAL DATA:  Fall,  ulcers EXAM: RIGHT TIBIA AND FIBULA - 2 VIEW; RIGHT FOOT - 2 VIEW; LEFT FOOT - 2 VIEW COMPARISON:  None. FINDINGS: Right tibia, fibula and right foot: No evidence of fracture or dislocation. Vascular calcifications. Soft tissue swelling seen about the right first MTP joint. No osseous destruction or periosteal reaction. No significant degenerative changes. Left foot: No evidence fracture or dislocation. Vascular calcifications. Ulcer the superficial soft tissues overlying the first MTP joint. No osseous destruction or periosteal reaction. No significant degenerative changes. IMPRESSION: No evidence of fracture or osteomyelitis. Electronically Signed   By: Yetta Glassman M.D.   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 EXAM: CT OF THE LOWER LEFT EXTREMITY WITH CONTRAST TECHNIQUE: Multidetector CT imaging of the lower left extremity was performed according to the standard protocol following intravenous contrast administration. CONTRAST:  15m OMNIPAQUE IOHEXOL 300 MG/ML  SOLN COMPARISON:  None. FINDINGS: Bones/Joint/Cartilage No cortical erosion or periosteal reaction. No evidence of fracture or dislocation. Degenerative changes at the talonavicular and calcaneonavicular joint space subchondral cystic changes and minimal osteophytes. Subtalar and tibiotalar joints are intact. Ligaments Suboptimally assessed by CT. Muscles and Tendons Muscles are normal in bulk. No evidence of intramuscular fluid collection or abscess. Soft tissues There is marked skin thickening and subcutaneous soft tissue edema about the distal aspect of the foot. There is a deep skin wound about the plantar aspect of the first metatarsophalangeal joint without evidence of drainable fluid collection or abscess. IMPRESSION: 1. Skin thickening and subcutaneous soft tissue edema about the forefoot consistent with cellulitis. 2. Deep skin wound about  the plantar aspect of the first metatarsophalangeal joint. No cortical erosion or periosteal reaction concerning for osteomyelitis. Evaluation of early osteomyelitis is however limited on CT scans. If there is persistent clinical concern MR examination could be obtained for further evaluation. Electronically Signed   By: IKeane PoliceD.O.   On: 11/23/2021 16:30  CT TIBIA FIBULA RIGHT W CONTRAST  Result Date: 11/23/2021 CLINICAL DATA:  Leg swelling and sores. EXAM: CT OF THE LOWER RIGHT EXTREMITY WITH CONTRAST TECHNIQUE: Multidetector CT imaging of the lower right extremity was performed according to the standard protocol following intravenous contrast administration. CONTRAST:  65m OMNIPAQUE IOHEXOL 300 MG/ML  SOLN COMPARISON:  75 cc Omnipaque 300 FINDINGS: Bones/Joint/Cartilage Knee osteoarthritis especially affecting the patellofemoral compartment. No bony destructive findings in the tibial or fibular characteristic of osteomyelitis. Small knee effusion without a substantial degree of synovitis. Posterior meniscal chondrocalcinosis versus free osteochondral fragment along the posterior horn medial meniscus. Plantar and Achilles calcaneal spurs. Accessory navicular noted. Small Baker's cyst noted. Ligaments Suboptimally assessed by CT. Muscles and Tendons No intramuscular abscess or hematoma. Soft tissues Subcutaneous edema at the level of the knee is primarily anterior and lateral. This tracks distally in the calf and is more confluent in the distal calf and in the ankle region where the appearance is more circumferential. The cause of this subcutaneous edema is nonspecific. No subcutaneous abscess is present. There are scattered irregular subcutaneous calcifications compatible with venous insufficiency. Atherosclerotic calcification of arterial vascular structures also noted. Today's exam is not a CT angiogram and does not adequately assess patency of the arterial vessels distal to the popliteal artery. I  do not see a definite large cutaneous ulceration or gas tracking in the subcutaneous tissues. IMPRESSION: 1. No findings of osteomyelitis or drainable abscess. 2. Subcutaneous edema especially in the distal calf and ankle region. Cellulitis not excluded. No gas in the soft tissues. 3. Subcutaneous calcifications along the calf compatible with chronic venous insufficiency. 4. Small knee effusion and small Baker's cyst. No substantial synovitis. 5. Posterior horn medial meniscus chondrocalcinosis versus small adjacent free osteochondral fragment. 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 6. Plantar and Achilles calcaneal spurs. Electronically Signed   By: WVan ClinesM.D.   On: 11/23/2021 16:54   MR FOOT RIGHT WO CONTRAST  Result Date: 11/23/2021 CLINICAL DATA:  Foot swelling, diabetic, osteomyelitis suspected. No prior imaging. EXAM: MRI OF THE RIGHT FOREFOOT WITHOUT CONTRAST TECHNIQUE: Multiplanar, multisequence MR imaging of the right was performed. No intravenous contrast was administered. COMPARISON:  Radiographs dated November 21, 2021. FINDINGS: Bones/Joint/Cartilage No fracture or dislocation. Normal alignment. No joint effusion. No marrow signal abnormality. Ligaments Collateral ligaments are intact.  Lisfranc ligament is intact. Muscles and Tendons Flexor, peroneal and extensor compartment tendons are intact. 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 No drainable fluid collection or abscess. Soft tissue Skin thickening and prominent subcutaneous soft tissue edema about the dorsal and plantar aspect of the forefoot. No drainable fluid collection or abscess. No soft tissue mass. IMPRESSION: 1.  No MR evidence of acute osteomyelitis. 2. Skin thickening and subcutaneous soft tissue edema consistent with cellulitis. No drainable fluid collection or abscess. Electronically Signed   By: IKeane PoliceD.O.   On:  11/23/2021 11:45   MR FOOT LEFT WO CONTRAST  Result Date: 11/23/2021 CLINICAL DATA:  Foot swelling, diabetic osteomyelitis suspected. EXAM: MRI OF THE LEFT FOOT WITHOUT CONTRAST TECHNIQUE: Multiplanar, multisequence MR imaging of the left was performed. No intravenous contrast was administered. COMPARISON:  Radiographs dated November 21, 2021 FINDINGS: Nondiagnostic examination due to significant motion. Subcutaneous soft tissue edema. IMPRESSION: Nondiagnostic examination due to motion. Subcutaneous soft tissue edema. Electronically Signed   By: IKeane PoliceD.O.   On: 11/23/2021 11:37   [place]Abdomen Limited  Result Date: 11/22/2021 CLINICAL  DATA:  Elevated liver enzymes. Gallbladder surgically absent. EXAM: ULTRASOUND ABDOMEN LIMITED RIGHT UPPER QUADRANT COMPARISON:  CT abdomen and pelvis, 11/21/2021. FINDINGS: Gallbladder: Surgically absent. Common bile duct: Diameter: 4 mm Liver: Suboptimally visualized. Normal overall size. No convincing mass. Portal vein is patent on color Doppler imaging with normal direction of blood flow towards the liver. Other: None. IMPRESSION: 1. Significantly limited study due to the patient's body habitus. 2. No acute findings. No bile duct dilation. Liver not well assessed, better visualized on the previous day's CT. Electronically Signed   By: Lajean Manes M.D.   On: 11/22/2021 09:01   US Venous Img Lower Bilateral (DVT)  Result Date: 11/23/2021 CLINICAL DATA:  Bilateral lower extremity chronic pain and edema EXAM: BILATERAL LOWER EXTREMITY VENOUS DOPPLER ULTRASOUND TECHNIQUE: Gray-scale sonography with graded compression, as well as color Doppler and duplex ultrasound were performed to evaluate the lower extremity deep venous systems from the level of the common femoral vein and including the common femoral, femoral, profunda femoral, popliteal and calf veins including the posterior tibial, peroneal and gastrocnemius veins when visible. The superficial great saphenous  vein was also interrogated. Spectral Doppler was utilized to evaluate flow at rest and with distal augmentation maneuvers in the common femoral, femoral and popliteal veins. COMPARISON:  None. FINDINGS: RIGHT LOWER EXTREMITY Common Femoral Vein: No evidence of thrombus. Normal compressibility, respiratory phasicity and response to augmentation. Saphenofemoral Junction: No evidence of thrombus. Normal compressibility and flow on color Doppler imaging. Profunda Femoral Vein: No evidence of thrombus. Normal compressibility and flow on color Doppler imaging. Femoral Vein: No evidence of thrombus. Normal compressibility, respiratory phasicity and response to augmentation. Popliteal Vein: No evidence of thrombus. Normal compressibility, respiratory phasicity and response to augmentation. Calf Veins: No evidence of thrombus. Normal compressibility and flow on color Doppler imaging. Other Findings: Right popliteal fossa minimally complex thick-walled Baker's cyst measures 4.5 x 1.4 x 2.9 cm LEFT LOWER EXTREMITY Common Femoral Vein: No evidence of thrombus. Normal compressibility, respiratory phasicity and response to augmentation. Saphenofemoral Junction: No evidence of thrombus. Normal compressibility and flow on color Doppler imaging. Profunda Femoral Vein: No evidence of thrombus. Normal compressibility and flow on color Doppler imaging. Femoral Vein: No evidence of thrombus. Normal compressibility, respiratory phasicity and response to augmentation. Popliteal Vein: No evidence of thrombus. Normal compressibility, respiratory phasicity and response to augmentation. Calf Veins: No evidence of thrombus. Normal compressibility and flow on color Doppler imaging. IMPRESSION: No significant DVT demonstrated in either extremity. Limited exam because of obesity. 4.5 cm right knee Baker's cyst. Electronically Signed   By: Jerilynn Mages.  Shick M.D.   On: 11/23/2021 14:10   US ARTERIAL ABI (SCREENING LOWER EXTREMITY)  Result Date:  11/23/2021 CLINICAL DATA:  60 year old female with a history bilateral wounds EXAM: NONINVASIVE PHYSIOLOGIC VASCULAR STUDY OF BILATERAL LOWER EXTREMITIES TECHNIQUE: Evaluation of both lower extremities was performed at rest, including calculation of ankle-brachial indices, multiple segmental pressure evaluation, segmental Doppler and segmental pulse volume recording. COMPARISON:  None. FINDINGS: Right ABI:  0.96 Left ABI:  1.35 Right Lower Extremity: Segmental Doppler at the right ankle demonstrates monophasic waveforms Left Lower Extremity: Segmental Doppler at the left ankle demonstrates monophasic waveforms IMPRESSION: Resting ABI the bilateral lower extremity within normal limits, though could be falsely elevated given the segmental exam. Segmental Doppler at the bilateral ankles demonstrates monophasic waveforms. Signed, Dulcy Fanny. Dellia Nims, RPVI [facility] Electronically Signed   By: Corrie Mckusick D.O.   On: 11/23/2021 11:21  DG Foot 2 Views Left  Result Date: 11/06/2021 CLINICAL DATA:  Fall, ulcers EXAM: RIGHT TIBIA AND FIBULA - 2 VIEW; RIGHT FOOT - 2 VIEW; LEFT FOOT - 2 VIEW COMPARISON:  None. FINDINGS: Right tibia, fibula and right foot: No evidence of fracture or dislocation. Vascular calcifications. Soft tissue swelling seen about the right first MTP joint. No osseous destruction or periosteal reaction. No significant degenerative changes. Left foot: No evidence fracture or dislocation. Vascular calcifications. Ulcer the superficial soft tissues overlying the first MTP joint. No osseous destruction or periosteal reaction. No significant degenerative changes. IMPRESSION: No evidence of fracture or osteomyelitis. Electronically Signed   By: Yetta Glassman M.D.   On: 11/06/2021 15:32   DG Foot 2 Views Right  Result Date: 11/06/2021 CLINICAL DATA:  Fall, ulcers EXAM: RIGHT TIBIA AND FIBULA - 2 VIEW; RIGHT FOOT - 2 VIEW; LEFT FOOT - 2 VIEW  COMPARISON:  None. FINDINGS: Right tibia, fibula and right foot: No evidence of fracture or dislocation. Vascular calcifications. Soft tissue swelling seen about the right first MTP joint. No osseous destruction or periosteal reaction. No significant degenerative changes. Left foot: No evidence fracture or dislocation. Vascular calcifications. Ulcer the superficial soft tissues overlying the first MTP joint. No osseous destruction or periosteal reaction. No significant degenerative changes. IMPRESSION: No evidence of fracture or osteomyelitis. Electronically Signed   By: Yetta Glassman M.D.   On: 11/06/2021 15:32   DG Foot Complete Left  Result Date: 11/21/2021 CLINICAL DATA:  Wounds to both feet x2 weeks. EXAM: LEFT FOOT - COMPLETE 3+ VIEW COMPARISON:  November 06, 2021 FINDINGS: There is no evidence of fracture or dislocation. There is no evidence of arthropathy or other focal bone abnormality. A 1.2 cm superficial soft tissue ulceration is seen along the plantar aspect of the distal left foot. Moderate severity soft tissue swelling is also seen within this region. IMPRESSION: Plantar soft tissue ulceration and associated soft tissue swelling, as described above, without an acute osseous abnormality. Electronically Signed   By: Virgina Norfolk M.D.   On: 11/21/2021 21:33   DG Foot Complete Right  Result Date: 11/21/2021 CLINICAL DATA:  Wounds to both feet x2 weeks. EXAM: RIGHT FOOT COMPLETE - 3+ VIEW COMPARISON:  November 06, 2021 FINDINGS: There is no evidence of fracture or dislocation. There is no evidence of arthropathy or other focal bone abnormality. Mild to moderate severity vascular calcification is seen. Mild soft tissue swelling is seen along the plantar aspect of the distal right foot. IMPRESSION: Plantar soft tissue swelling, as described above, without an acute osseous abnormality. Electronically Signed   By: Virgina Norfolk M.D.   On: 11/21/2021 21:31   IR PICC PLACEMENT LEFT >5 YRS  INC IMG GUIDE  Result Date: 11/25/2021 INDICATION: Patient with history of osteomyelitis left foot, cellulitis requiring long-term IV antibiotic therapy. Request to IR for PICC placement for durable venous access. EXAM: LEFT UPPER EXTREMITY PICC LINE PLACEMENT WITH ULTRASOUND AND FLUOROSCOPIC GUIDANCE MEDICATIONS: 3 mL 1% lidocaine ANESTHESIA/SEDATION: None. FLUOROSCOPY TIME:  Fluoroscopy Time: 0 minutes 24 seconds (16.5 mGy). COMPLICATIONS: None immediate. PROCEDURE: The patient was advised of the possible risks and complications and agreed to undergo the procedure. The patient was then brought to the angiographic suite for the procedure. The left arm was prepped with chlorhexidine, draped in the usual sterile fashion using maximum barrier technique (cap and mask, sterile gown, sterile gloves, large sterile sheet, hand hygiene and cutaneous antisepsis) and infiltrated locally with 1% Lidocaine. Ultrasound demonstrated patency of  the left basilic vein, and this was documented with an image. Under real-time ultrasound guidance, this vein was accessed with a 21 gauge micropuncture needle and image documentation was performed. A 0.018 wire was introduced in to the vein. Over this, a 5 [place] single lumen power injectable PICC was advanced to the lower SVC/right atrial junction. Fluoroscopy during the procedure and fluoro spot radiograph confirms appropriate catheter position. The catheter was flushed, aspirated, and covered with a sterile dressing. Catheter length: 42 cm IMPRESSION: Successful left arm power PICC line placement with ultrasound and fluoroscopic guidance. The catheter is ready for use. Read by Candiss Norse, PA-C Electronically Signed   By: Ruthann Cancer M.D.   On: 11/25/2021 13:54   [place] EKG SITE RITE  Result Date: 11/23/2021 If Site Rite image not attached, placement could not be confirmed due to current cardiac rhythm.   Irwin Brakeman, MD  How to contact the [facility] Attending or Consulting  provider [place] or covering provider during after hours [place], for this patient?  Check the care team in [facility] and look for a) attending/consulting TRH provider listed and b) the [facility] team listed Log into [email or web address] and use [place]'s universal password to access. If you do not have the password, please contact the hospital operator. Locate the [facility] provider you are looking for under [facility] and page to a number that you can be directly reached. If you still have difficulty reaching the provider, please page the [facility] (Director on Call) for the Hospitalists listed on amion for assistance.  11/25/2021, 2:37 PM   LOS: 4 days

## 2021-11-25 NOTE — Progress Notes (Signed)
° °  Patient Status: AP IP  Assessment and Plan: Patient in need of venous access.   Peripherally Inserted central catheter placement  ______________________________________________________________________   History of Present Illness: [name] is a 60 y.o. female   DM; OSA; HTN; obese N/V Admitted 12/17 Apparently sxs x few weeks-- worsening per spouse Confusion Unable to get out of bed Dx with cellulitis of legs and rt arm 12/2 while in hospital then Sent home on Doxycycline Diabetic foot infection/Osteomyelitis left foot Uncontrolled diabetes mellitus type 2 with hyperglycemia Abdominal wall cellulitis/Cellulitis leg Transaminasemia Acute kidney injury:  improving now Cr 1.2 Pyuria Acute metabolic encephalopathy Lactic acidosis Morbid Obesity  Vascular team unsuccessful with PICC placement at AP 11/24/21 Request for PICC in IR Planned for today   Allergies and medications reviewed.   Review of Systems: A 12 point ROS discussed and pertinent positives are indicated in the HPI above.  All other systems are negative.  Vital Signs: BP (!) 177/78 (BP Location: Right Wrist)    Pulse 89    Temp 98.4 F (36.9 C)    Resp 17    Ht 5\' 1"  (1.549 m)    Wt 293 lb 3.4 oz (133 kg)    LMP 08/06/2012    SpO2 99%    BMI 55.40 kg/m     Imaging reviewed.   Labs:  COAGS: Recent Labs    11/22/21 0449  APTT 31    BMP: Recent Labs    11/22/21 0449 11/23/21 0411 11/24/21 0527 11/25/21 0219  NA 140 138 142 138  K 3.9 3.4* 3.1* 3.5  CL 100 100 104 103  CO2 28 26 29 27   GLUCOSE 293* 145* 124* 260*  BUN 22* 25* 18 18  CALCIUM 8.4* 8.4* 8.6* 8.3*  CREATININE 1.48* 1.54* 1.36* 1.20*  GFRNONAA 40* 38* 45* 52*   Scheduled for PICC placement in IR today To be at [facility] via ambulance asap Will return when picc placed Consent per husband Edsel via phone He is aware of risks and benefits including but not limited to: infection; bleeding; vessel damage He has  good understanding of plan    Electronically Signed: Lavonia Drafts, PA-C 11/25/2021, 7:54 AM   I spent a total of 15 minutes in face to face in clinical consultation, greater than 50% of which was counseling/coordinating care for venous access.

## 2021-11-25 NOTE — TOC Progression Note (Addendum)
Transition of Care [facility]) - Progression Note    Patient Details  Name: [name] MRN: [id number] Date of Birth: 1961/11/26  Transition of [facility]) CM/SW Contact  Salome Arnt, [place] Phone Number: 11/25/2021, 10:45 AM  Clinical Narrative:  LCSW provided bed offers to pt's husband and he selects [facility]. Facility notified. PICC today and anticipate d/c tomorrow. Authorization received: [id number], next review 12/23. [place] completed.     Expected Discharge Plan: [place] Barriers to Discharge: Continued Medical Work up  Expected Discharge Plan and Services Expected Discharge Plan: [place] In-house Referral: Clinical Social Work   Post Acute Care Choice: [place] Living arrangements for the past 2 months: Single Family Home                                       Social Determinants of Health (SDOH) Interventions    Readmission Risk Interventions Readmission Risk Prevention Plan 11/23/2021  Transportation Screening Complete  Medication Review (RN CM) Complete  Some recent data might be hidden

## 2021-11-25 NOTE — Consult Note (Addendum)
Referring Provider: No ref. provider found Primary Care Physician:  Bridget Hartshorn, NP Primary Gastroenterologist:  not previously established  Date of Admission: 11/21/21 Date of Consultation: 11/25/21  Reason for Consultation:  Elevated LFTs  HPI:  [name] is a 60 y.o. year old female with history of OSA, DM type 2, HTN, depression, presenting on 12/18 with generalized weakness, nausea, and dizziness x2 weeks, worse over the past 5-6 days, with vomiting the past 2 days,essentially leaving patient bed ridden. She reported soiling herself due to inability to get out of bed. She endorsed some looser stools but no diarrhea, hematochezia or melena. Notably, she was seen in ED on 12/2, diagnosed with cellulitis and sent home with Rx doxycycline which she took. Appears patient has not been seen for diabetic wound care since Nov 2021. Patient is apparently a poor historian, and not able to provide much substantial history.  ED Course: afebrile, hemodynamically stable and VSS. Creatnine 1.27, AST 177, ALT 55, aslk phos 118, T bili 1.1, WBC 18.8, plt 450K, lactic acid 2, started on vancomycin and iv fluid rehydration initiated. GI consulted for Elevated LFTs   Consult:  CT Abd 12/17 with mild splenomegaly, atrophic pancreas, no bowel wall thickening, s/p cholecystectomy. RUQ [place] 12/18 was limited due to patients body habitus, no bile duct dilation, liver was not well assessed.  EBV (in process), CMV (negative), and AIH serologies (in process) ordered. Transaminases appear to be trending back down today, were WNL 1 year ago, elevated initially 4 days ago, though they peaked yesterday, AST 224(177) ALT 115 (55), Alk phos 144 (118) T bili remains WNL at 0.4, albumin 2.2.   Hgb 11, MCV 82.6, Ferritin 75, Iron 15, TIBC 183, saturation 8  Today, she reports that she is feeling a little better. Patient is a poor historian, not able to provide much background of her health history. She  reports that she was feeling unwell for about 3 weeks prior to coming to the hospital. She reports that she has never been told that she had any liver issues in the past, she denies any history of alcohol use or illicit drugs in the past. She reports mid epigastric pain that radiates to her LUQ, she denies any nausea or vomiting and experiences issues with acid reflux at times. She does endorse some swelling to her abdomen for maybe 7 months, she has swelling to BLLE at baseline r/t CHF, she is alert and oriented to person only disoriented to time, situation and location and repeatedly calls out to people who are not there during my exam. She does endorse chills at times, she states that she has had some darker stools, thinks they may have been black, reports that this occurs almost daily, she denies any rectal bleeding or recent weight loss. She was recently started on Keflex, magnesium and doxycycline in the beginning of December, r/t suspected cellulitis.   Family hx: denies family hx of liver disease Social: denies tobacco, alcohol or drug use  Last colonoscopy: never Last SWN:IOEVO  Past Medical History:  Diagnosis Date   Asthma    COPD (chronic obstructive pulmonary disease) ([place])    Depression    Diabetes mellitus    Diastolic dysfunction 02/08/92   Hypertension    Prolonged QT interval 05/14/2015    Past Surgical History:  Procedure Laterality Date   CATARACT EXTRACTION     CESAREAN SECTION     CHOLECYSTECTOMY      Prior to Admission medications   Medication  Sig Start Date End Date Taking? Authorizing Provider  albuterol (VENTOLIN HFA) 108 (90 Base) MCG/ACT inhaler Inhale 2 puffs into the lungs every 6 (six) hours as needed for wheezing or shortness of breath.   Yes [provider]  atorvastatin (LIPITOR) 10 MG tablet Take 10 mg by mouth at bedtime. 09/29/21  Yes [provider]  esomeprazole (NEXIUM) 20 MG capsule Take 20 mg by mouth 2 (two) times daily before a  meal.   Yes [provider]  gabapentin (NEURONTIN) 100 MG capsule Take 300 mg by mouth 3 (three) times daily.   Yes [provider]  hydrOXYzine (ATARAX/VISTARIL) 10 MG tablet Take 10 mg by mouth every 8 (eight) hours as needed for itching or anxiety.  11/22/19  Yes [provider]  insulin aspart protamine- aspart (NOVOLOG MIX 70/30) (70-30) 100 UNIT/ML injection Inject 60-80 Units into the skin See admin instructions. 80 units in the morning and 60 units in the evening   Yes [provider]  Magnesium Oxide 200 MG TABS Take 1 tablet (200 mg total) by mouth 2 (two) times daily. 11/06/21  Yes Daleen Bo, MD  potassium chloride SA (K-DUR) 20 MEQ tablet Take 20 mEq by mouth 2 (two) times daily.   Yes [provider]  promethazine (PHENERGAN) 25 MG tablet Take 25 mg by mouth every 6 (six) hours as needed. 08/05/21  Yes [provider]  propranolol ER (INDERAL LA) 80 MG 24 hr capsule Take 80 mg by mouth daily. 12/28/19  Yes [provider]  sertraline (ZOLOFT) 100 MG tablet Take 100 mg by mouth daily. 12/19/19  Yes [provider]  sertraline (ZOLOFT) 50 MG tablet Take 50 mg by mouth daily. Take 100 mg tablet to make a total of 150 mg daily 09/29/21  Yes [provider]  torsemide (DEMADEX) 20 MG tablet Take 20 mg by mouth 2 (two) times daily.  03/28/18  Yes [provider]  famotidine (PEPCID) 20 MG tablet Take 20 mg by mouth 2 (two) times daily as needed. Patient not taking: Reported on 11/22/2021 07/24/21   [provider]  magnesium oxide (MAG-OX) 400 (240 Mg) MG tablet Take 1 tablet by mouth daily. 11/07/21   [provider]    Current Facility-Administered Medications  Medication Dose Route Frequency Provider Last Rate Last Admin   albuterol (PROVENTIL) (2.5 MG/3ML) 0.083% nebulizer solution 3 mL  3 mL Inhalation Q6H PRN Adefeso, Oladapo, DO       amLODipine (NORVASC) tablet 5 mg  5 mg Oral  Daily Tat, David, MD   5 mg at 11/24/21 1544   atorvastatin (LIPITOR) tablet 10 mg  10 mg Oral QHS Johnson, Clanford L, MD       cefTRIAXone (ROCEPHIN) 2 g in sodium chloride 0.9 % 100 mL IVPB  2 g Intravenous Q24H Tat, David, MD 200 mL/hr at 11/25/21 0800 2 g at 11/25/21 0800   enoxaparin (LOVENOX) injection 65 mg  65 mg Subcutaneous Q24H Tat, Shanon Brow, MD   65 mg at 11/24/21 0930   famotidine (PEPCID) tablet 20 mg  20 mg Oral BID PRN Adefeso, Oladapo, DO   20 mg at 11/24/21 2324   feeding supplement (GLUCERNA SHAKE) (GLUCERNA SHAKE) liquid 237 mL  237 mL Oral TID BM Adefeso, Oladapo, DO   237 mL at 11/24/21 2323   gabapentin (NEURONTIN) capsule 100 mg  100 mg Oral BID Adefeso, Oladapo, DO   100 mg at 11/24/21 2325   Gerhardt's butt cream  Topical TID Orson Eva, MD   Given at 11/24/21 2326   hydrALAZINE (APRESOLINE) injection 10 mg  10 mg Intravenous Q6H PRN Tat, Shanon Brow, MD       hydrALAZINE (APRESOLINE) tablet 25 mg  25 mg Oral Franco Collet, MD   25 mg at 11/25/21 3428   hydrOXYzine (ATARAX) tablet 10 mg  10 mg Oral Q8H PRN Johnson, Clanford L, MD       influenza vac split quadrivalent PF (FLUARIX) injection 0.5 mL  0.5 mL Intramuscular Tomorrow-1000 Adefeso, Oladapo, DO       insulin aspart (novoLOG) injection 0-20 Units  0-20 Units Subcutaneous TID WC Tat, Shanon Brow, MD   11 Units at 11/25/21 0758   insulin aspart (novoLOG) injection 0-5 Units  0-5 Units Subcutaneous QHS Orson Eva, MD   3 Units at 11/24/21 2324   insulin aspart (novoLOG) injection 12 Units  12 Units Subcutaneous TID WC Johnson, Clanford L, MD       insulin glargine-yfgn (SEMGLEE) injection 40 Units  40 Units Subcutaneous QHS Johnson, Clanford L, MD       lactated ringers infusion   Intravenous Continuous Tat, Shanon Brow, MD 125 mL/hr at 11/25/21 0517 New Bag at 11/25/21 0517   liver oil-zinc oxide (DESITIN) 40 % ointment   Topical TID Orson Eva, MD   Given at 11/24/21 2327   magnesium oxide (MAG-OX) tablet 400 mg  400 mg Oral Daily  Johnson, Clanford L, MD       nystatin (MYCOSTATIN/NYSTOP) topical powder   Topical BID Tat, David, MD   Given at 11/24/21 2327   pantoprazole (PROTONIX) EC tablet 40 mg  40 mg Oral Daily Adefeso, Oladapo, DO   40 mg at 11/24/21 7681   potassium chloride SA (KLOR-CON M) CR tablet 20 mEq  20 mEq Oral BID Johnson, Clanford L, MD       prochlorperazine (COMPAZINE) injection 5 mg  5 mg Intravenous Q6H PRN Adefeso, Oladapo, DO       propranolol ER (INDERAL LA) 24 hr capsule 80 mg  80 mg Oral Daily Johnson, Clanford L, MD       sertraline (ZOLOFT) tablet 150 mg  150 mg Oral Daily Johnson, Clanford L, MD       torsemide (DEMADEX) tablet 20 mg  20 mg Oral BID Johnson, Clanford L, MD       vancomycin (VANCOCIN) IVPB 1000 mg/200 mL premix  1,000 mg Intravenous Q24H Erenest Blank, RPH        Allergies as of 11/21/2021 - Review Complete 11/21/2021  Allergen Reaction Noted   Carvedilol Palpitations 01/30/2020   Benicar [olmesartan] Swelling 07/22/2014   Codeine Other (See Comments) 08/20/2011   Sulfa antibiotics Swelling 15/72/6203   Trulicity [dulaglutide] Diarrhea 09/07/2017    Family History  Problem Relation Age of Onset   Stroke Mother    Diabetes Father    Heart failure Father    Hypertension Father    Diabetes Sister    Heart failure Sister    Hypertension Sister    Stroke Sister    Cancer Other    Stroke Sister     Social History   Socioeconomic History   Marital status: Married    Spouse name: Not on file   Number of children: Not on file   Years of education: Not on file   Highest education level: Not on file  Occupational History   Not on file  Tobacco Use   Smoking status: Passive Smoke Exposure - Never Smoker  Smokeless tobacco: Never  Vaping Use   Vaping Use: Never used  Substance and Sexual Activity   Alcohol use: No    Alcohol/week: 0.0 standard drinks   Drug use: No   Sexual activity: Yes    Birth control/protection: None  Other Topics Concern   Not on  file  Social History Narrative   Lives with husband.  One living son.  Daughter died after transplant age 73.     Social Determinants of Health   Financial Resource Strain: Not on file  Food Insecurity: Not on file  Transportation Needs: Not on file  Physical Activity: Not on file  Stress: Not on file  Social Connections: Not on file  Intimate Partner Violence: Not on file   Review of Systems: Gen: Denies fever, chills, loss of appetite, change in weight or weight loss CV: Denies chest pain, heart palpitations, syncope, edema  Resp: Denies shortness of breath with rest, cough, wheezing GI: Denies hematochezia, nausea, vomiting, diarrhea, constipation, dysphagia, odyonophagia, early satiety or weight loss. +dark stools/maybe black  GU : Denies urinary burning, urinary frequency, urinary incontinence.  MS: Denies joint pain,swelling, cramping Derm: Denies rash, itching, dry skin Psych: Denies depression, anxiety,confusion, or memory loss Heme: Denies bruising, bleeding, and enlarged lymph nodes.  Physical Exam: Vital signs in last 24 hours: Temp:  [97.7 F (36.5 C)-98.4 F (36.9 C)] 98.4 F (36.9 C) (12/21 0434) Pulse Rate:  [85-95] 89 (12/21 0434) Resp:  [17-20] 17 (12/21 0434) BP: (175-198)/(73-91) 177/78 (12/21 0434) SpO2:  [88 %-100 %] 99 % (12/21 0434) Last BM Date: 11/22/21 General:   Alert,  Well-developed, obese pleasant and cooperative in NAD Head:  Normocephalic and atraumatic. Eyes:  Sclera clear, no icterus.   Conjunctiva pink. Ears:  Normal auditory acuity. Nose:  No deformity, discharge,  or lesions. Mouth:  No deformity or lesions, dentition normal. Lungs: expiratory wheezing throughout Heart:  Regular rate and rhythm; no murmurs, clicks, rubs,  or gallops. Abdomen:  Soft, full, nontender and nondistended. No masses, hepatosplenomegaly or hernias noted. Normal bowel sounds, without guarding, and without rebound.   Rectal:  Deferred until time of colonoscopy.    Msk:  Symmetrical without gross deformities. Normal posture. Pulses:  Normal pulses noted. Extremities:  Without clubbing or edema. Neurologic:  Alert and oriented only to person. Confused about where she is, calling out to people not present. No asterixis Skin:  multiple diabetic wounds to both legs with una boots in place, as well as diabetic wounds to upper extremities Psych:  Alert and cooperative. Normal mood and affect.  Intake/Output from previous day: 12/20 0701 - 12/21 0700 In: 4129.9 [P.O.:1640; I.V.:1879.8; IV Piggyback:610.1] Out: 1950 [Urine:1950] Intake/Output this shift: Total I/O In: 120 [P.O.:120] Out: -   Lab Results: Recent Labs    11/23/21 0411 11/24/21 0527 11/25/21 0219  WBC 12.5* 11.3* 10.9*  HGB 11.8* 11.5* 11.0*  HCT 36.2 36.2 34.1*  PLT 396 371 330   BMET Recent Labs    11/23/21 0411 11/24/21 0527 11/25/21 0219  NA 138 142 138  K 3.4* 3.1* 3.5  CL 100 104 103  CO2 _0 GLUCOSE 145* 124* 260*  BUN 25* 18 18  CREATININE 1.54* 1.36* 1.20*  CALCIUM 8.4* 8.6* 8.3*   LFT Recent Labs    11/23/21 0411 11/24/21 0527 11/25/21 0219  PROT 6.3* 6.4* 6.3*  ALBUMIN 2.4* 2.2* 2.2*  AST 258* 280* 224*  ALT 106* 120* 115*  ALKPHOS 113 116 144*  BILITOT 0.5  0.6 0.4    Studies/Results: CT FOOT LEFT W CONTRAST  Result Date: 11/23/2021 CLINICAL DATA:  Foot swelling, diabetic osteomyelitis suspected. EXAM: CT OF THE LOWER LEFT EXTREMITY WITH CONTRAST TECHNIQUE: Multidetector CT imaging of the lower left extremity was performed according to the standard protocol following intravenous contrast administration. CONTRAST:  64mL OMNIPAQUE IOHEXOL 300 MG/ML  SOLN COMPARISON:  None. FINDINGS: Bones/Joint/Cartilage No cortical erosion or periosteal reaction. No evidence of fracture or dislocation. Degenerative changes at the talonavicular and calcaneonavicular joint space subchondral cystic changes and minimal osteophytes. Subtalar and tibiotalar joints  are intact. Ligaments Suboptimally assessed by CT. Muscles and Tendons Muscles are normal in bulk. No evidence of intramuscular fluid collection or abscess. Soft tissues There is marked skin thickening and subcutaneous soft tissue edema about the distal aspect of the foot. There is a deep skin wound about the plantar aspect of the first metatarsophalangeal joint without evidence of drainable fluid collection or abscess. IMPRESSION: 1. Skin thickening and subcutaneous soft tissue edema about the forefoot consistent with cellulitis. 2. Deep skin wound about the plantar aspect of the first metatarsophalangeal joint. No cortical erosion or periosteal reaction concerning for osteomyelitis. Evaluation of early osteomyelitis is however limited on CT scans. If there is persistent clinical concern MR examination could be obtained for further evaluation. Electronically Signed   By: Keane Police D.O.   On: 11/23/2021 16:30   CT TIBIA FIBULA RIGHT W CONTRAST  Result Date: 11/23/2021 CLINICAL DATA:  Leg swelling and sores. EXAM: CT OF THE LOWER RIGHT EXTREMITY WITH CONTRAST TECHNIQUE: Multidetector CT imaging of the lower right extremity was performed according to the standard protocol following intravenous contrast administration. CONTRAST:  80mL OMNIPAQUE IOHEXOL 300 MG/ML  SOLN COMPARISON:  75 cc Omnipaque 300 FINDINGS: Bones/Joint/Cartilage Knee osteoarthritis especially affecting the patellofemoral compartment. No bony destructive findings in the tibial or fibular characteristic of osteomyelitis. Small knee effusion without a substantial degree of synovitis. Posterior meniscal chondrocalcinosis versus free osteochondral fragment along the posterior horn medial meniscus. Plantar and Achilles calcaneal spurs. Accessory navicular noted. Small Baker's cyst noted. Ligaments Suboptimally assessed by CT. Muscles and Tendons No intramuscular abscess or hematoma. Soft tissues Subcutaneous edema at the level of the knee is  primarily anterior and lateral. This tracks distally in the calf and is more confluent in the distal calf and in the ankle region where the appearance is more circumferential. The cause of this subcutaneous edema is nonspecific. No subcutaneous abscess is present. There are scattered irregular subcutaneous calcifications compatible with venous insufficiency. Atherosclerotic calcification of arterial vascular structures also noted. Today's exam is not a CT angiogram and does not adequately assess patency of the arterial vessels distal to the popliteal artery. I do not see a definite large cutaneous ulceration or gas tracking in the subcutaneous tissues. IMPRESSION: 1. No findings of osteomyelitis or drainable abscess. 2. Subcutaneous edema especially in the distal calf and ankle region. Cellulitis not excluded. No gas in the soft tissues. 3. Subcutaneous calcifications along the calf compatible with chronic venous insufficiency. 4. Small knee effusion and small Baker's cyst. No substantial synovitis. 5. Posterior horn medial meniscus chondrocalcinosis versus small adjacent free osteochondral fragment. 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 6. Plantar and Achilles calcaneal spurs. Electronically Signed   By: Van Clines M.D.   On: 11/23/2021 16:54   US Venous Img Lower Bilateral (DVT)  Result Date: 11/23/2021 CLINICAL DATA:  Bilateral lower extremity chronic pain and edema EXAM: BILATERAL LOWER EXTREMITY VENOUS DOPPLER ULTRASOUND  TECHNIQUE: Gray-scale sonography with graded compression, as well as color Doppler and duplex ultrasound were performed to evaluate the lower extremity deep venous systems from the level of the common femoral vein and including the common femoral, femoral, profunda femoral, popliteal and calf veins including the posterior tibial, peroneal and gastrocnemius veins when visible. The superficial great saphenous vein was also interrogated. Spectral  Doppler was utilized to evaluate flow at rest and with distal augmentation maneuvers in the common femoral, femoral and popliteal veins. COMPARISON:  None. FINDINGS: RIGHT LOWER EXTREMITY Common Femoral Vein: No evidence of thrombus. Normal compressibility, respiratory phasicity and response to augmentation. Saphenofemoral Junction: No evidence of thrombus. Normal compressibility and flow on color Doppler imaging. Profunda Femoral Vein: No evidence of thrombus. Normal compressibility and flow on color Doppler imaging. Femoral Vein: No evidence of thrombus. Normal compressibility, respiratory phasicity and response to augmentation. Popliteal Vein: No evidence of thrombus. Normal compressibility, respiratory phasicity and response to augmentation. Calf Veins: No evidence of thrombus. Normal compressibility and flow on color Doppler imaging. Other Findings: Right popliteal fossa minimally complex thick-walled Baker's cyst measures 4.5 x 1.4 x 2.9 cm LEFT LOWER EXTREMITY Common Femoral Vein: No evidence of thrombus. Normal compressibility, respiratory phasicity and response to augmentation. Saphenofemoral Junction: No evidence of thrombus. Normal compressibility and flow on color Doppler imaging. Profunda Femoral Vein: No evidence of thrombus. Normal compressibility and flow on color Doppler imaging. Femoral Vein: No evidence of thrombus. Normal compressibility, respiratory phasicity and response to augmentation. Popliteal Vein: No evidence of thrombus. Normal compressibility, respiratory phasicity and response to augmentation. Calf Veins: No evidence of thrombus. Normal compressibility and flow on color Doppler imaging. IMPRESSION: No significant DVT demonstrated in either extremity. Limited exam because of obesity. 4.5 cm right knee Baker's cyst. Electronically Signed   By: Jerilynn Mages.  Shick M.D.   On: 11/23/2021 14:10   US ARTERIAL ABI (SCREENING LOWER EXTREMITY)  Result Date: 11/23/2021 CLINICAL DATA:  60 year old  female with a history bilateral wounds EXAM: NONINVASIVE PHYSIOLOGIC VASCULAR STUDY OF BILATERAL LOWER EXTREMITIES TECHNIQUE: Evaluation of both lower extremities was performed at rest, including calculation of ankle-brachial indices, multiple segmental pressure evaluation, segmental Doppler and segmental pulse volume recording. COMPARISON:  None. FINDINGS: Right ABI:  0.96 Left ABI:  1.35 Right Lower Extremity: Segmental Doppler at the right ankle demonstrates monophasic waveforms Left Lower Extremity: Segmental Doppler at the left ankle demonstrates monophasic waveforms IMPRESSION: Resting ABI the bilateral lower extremity within normal limits, though could be falsely elevated given the segmental exam. Segmental Doppler at the bilateral ankles demonstrates monophasic waveforms. Signed, Dulcy Fanny. Dellia Nims, RPVI [facility] Electronically Signed   By: Corrie Mckusick D.O.   On: 11/23/2021 11:21   [place] EKG SITE RITE  Result Date: 11/23/2021 If Site Rite image not attached, placement could not be confirmed due to current cardiac rhythm.   Impression: SANIYAH MONDESIR is a 60 y.o. year old female with history of OSA, DM type 2, HTN, depression, presenting on 12/18 with generalized weakness, nausea, and dizziness x2 weeks, worse over the past 5-6 days, with vomiting 2 days prior to presentation to ED, found to have elevated LFTs and noted splenomegaly on imagining, though liver not well visualized. GI consulted for further evaluation.   Elevated LFTs: last reviewable LFTs prior to admission was January 2021, LFTs WNL at that time, patient was started on keflex and doxycycline on 12/2 for suspected cellulitis, LFTs elevated on admission 4 days ago. Given patients significant  obesity, I suspect she likely has NASH at the least, though RUQ [place] was not definitive due to body habitus. I am suspicious that she could have an acute medication liver injury as  well, given her recent course of doxycycline and keflex, as doxy is known to cause hepatotoxicity.  Reassuringly, LFTs appear to be trending down some-AST 224(177) ALT 115 (55), Alk phos 144 (118) T bili remains WNL at 0.4, albumin 2.2.  further serologies pending to rule out infectious etiology and AIH. She does not drink and denies any previous history of alcohol use or illicit drugs. No asterixis present, though patient is confused, we will check ammonia level and obtain baseline INR.   Mild anemia: likely multifactorial, though she does have some iron deficiency. She reports dark stools over the past few days/maybe black? Patient is a poor historian, though making it difficult to obtain much history from her. She denies any rectal bleeding. She endorses some epigastric pain, though abdominal exam was benign. Hgb 11, MCV 82.6, Ferritin 75, Iron 15, TIBC 183, saturation 8. Will continue to monitor hgb and for overt GI bleeding, she has never had a colonoscopy or EGD, would likely benefit from both on outpatient basis once acute illness has resolved.    Plan: Trend LFTs  Follow for AIH serologies and infectious causes of liver injury Baseline INR Ammonia level Monitor for signs of HE Trend H&H, monitor for overt GI bleeding Continue PPI   LOS: 4 days    11/25/2021, 10:18 AM  Jashun Puertas L. Alver Sorrow, MSN, APRN, AGNP-C Adult-Gerontology Nurse Practitioner [facility]

## 2021-11-25 NOTE — Procedures (Signed)
PROCEDURE SUMMARY:  Successful placement of image-guided single lumen PICC line to the left basilic vein. Length 42 cm. Tip at lower SVC/RA. No complications. EBL = < 1 mL Ready for use.  Please see imaging section of Epic for full dictation.   Joaquim Nam PA-C 11/25/2021 12:34 PM

## 2021-11-25 NOTE — Plan of Care (Signed)

## 2021-11-25 NOTE — Progress Notes (Signed)
PHARMACY CONSULT NOTE FOR:  OUTPATIENT  PARENTERAL ANTIBIOTIC THERAPY (OPAT)  Indication: Diabetic foot infection/Osteomyelitis left foot  Regimen: Vancomycin 1000 mg IV every 24 hours Ceftriaxone 2000 mg IV every 24 hours.  End date: Last day of therapy on 12/20/2021 -then po doxy+amox/clav x 2 weeks thereafter (start 12/21/21)  IV antibiotic discharge orders are pended. To discharging provider:  please sign these orders via discharge navigator,  Select New Orders & click on the button choice - Manage This Unsigned Work.     Thank you for allowing pharmacy to be a part of this patient's care.  Ramond Craver 11/25/2021, 1:08 PM

## 2021-11-26 ENCOUNTER — Inpatient Hospital Stay (HOSPITAL_COMMUNITY): Payer: Medicare Other

## 2021-11-26 ENCOUNTER — Telehealth: Payer: Self-pay | Admitting: Gastroenterology

## 2021-11-26 LAB — BASIC METABOLIC PANEL
Anion gap: 8 (ref 5–15)
BUN: 16 mg/dL (ref 6–20)
CO2: 30 mmol/L (ref 22–32)
Calcium: 8.7 mg/dL — ABNORMAL LOW (ref 8.9–10.3)
Chloride: 102 mmol/L (ref 98–111)
Creatinine, Ser: 1.2 mg/dL — ABNORMAL HIGH (ref 0.44–1.00)
GFR, Estimated: 52 mL/min — ABNORMAL LOW (ref >60)
Glucose, Bld: 118 mg/dL — ABNORMAL HIGH (ref 70–99)
Potassium: 3.7 mmol/L (ref 3.5–5.1)
Sodium: 140 mmol/L (ref 135–145)

## 2021-11-26 LAB — CULTURE, BLOOD (ROUTINE X 2)
Culture: NO GROWTH
Culture: NO GROWTH
Special Requests: ADEQUATE
Special Requests: ADEQUATE

## 2021-11-26 LAB — AMMONIA: Ammonia: 29 umol/L (ref 9–35)

## 2021-11-26 LAB — BLOOD GAS, ARTERIAL
Acid-Base Excess: 6.6 mmol/L — ABNORMAL HIGH (ref 0.0–2.0)
Bicarbonate: 29.7 mmol/L — ABNORMAL HIGH (ref 20.0–28.0)
Drawn by: 27016
FIO2: 28
O2 Saturation: 93 %
Patient temperature: 37
pCO2 arterial: 50.1 mmHg — ABNORMAL HIGH (ref 32.0–48.0)
pH, Arterial: 7.41 (ref 7.350–7.450)
pO2, Arterial: 67.7 mmHg — ABNORMAL LOW (ref 83.0–108.0)

## 2021-11-26 LAB — GLUCOSE, CAPILLARY
Glucose-Capillary: 113 mg/dL — ABNORMAL HIGH (ref 70–99)
Glucose-Capillary: 114 mg/dL — ABNORMAL HIGH (ref 70–99)
Glucose-Capillary: 109 mg/dL — ABNORMAL HIGH (ref 70–99)
Glucose-Capillary: 112 mg/dL — ABNORMAL HIGH (ref 70–99)
Glucose-Capillary: 123 mg/dL — ABNORMAL HIGH (ref 70–99)

## 2021-11-26 LAB — EPSTEIN BARR VRS(EBV DNA BY PCR): EBV DNA QN by PCR: NEGATIVE IU/mL

## 2021-11-26 LAB — ANA: Anti Nuclear Antibody (ANA): NEGATIVE

## 2021-11-26 LAB — CERULOPLASMIN: Ceruloplasmin: 29.6 mg/dL (ref 19.0–39.0)

## 2021-11-26 LAB — IGG: IgG (Immunoglobin G), Serum: 1125 mg/dL (ref 586–1602)

## 2021-11-26 LAB — MAGNESIUM: Magnesium: 1.7 mg/dL (ref 1.7–2.4)

## 2021-11-26 LAB — ANTI-SMOOTH MUSCLE ANTIBODY, IGG: F-Actin IgG: 13 Units (ref 0–19)

## 2021-11-26 IMAGING — DX DG CHEST 1V PORT
1 series · 1 of 1 positions shown · non-contrast
Comparison: Radiographs [DATE] and [DATE].  CT [DATE].

CLINICAL DATA: Hypoxemia with weakness and shortness of breath.

EXAM:
PORTABLE CHEST 1 VIEW

[chest ap]
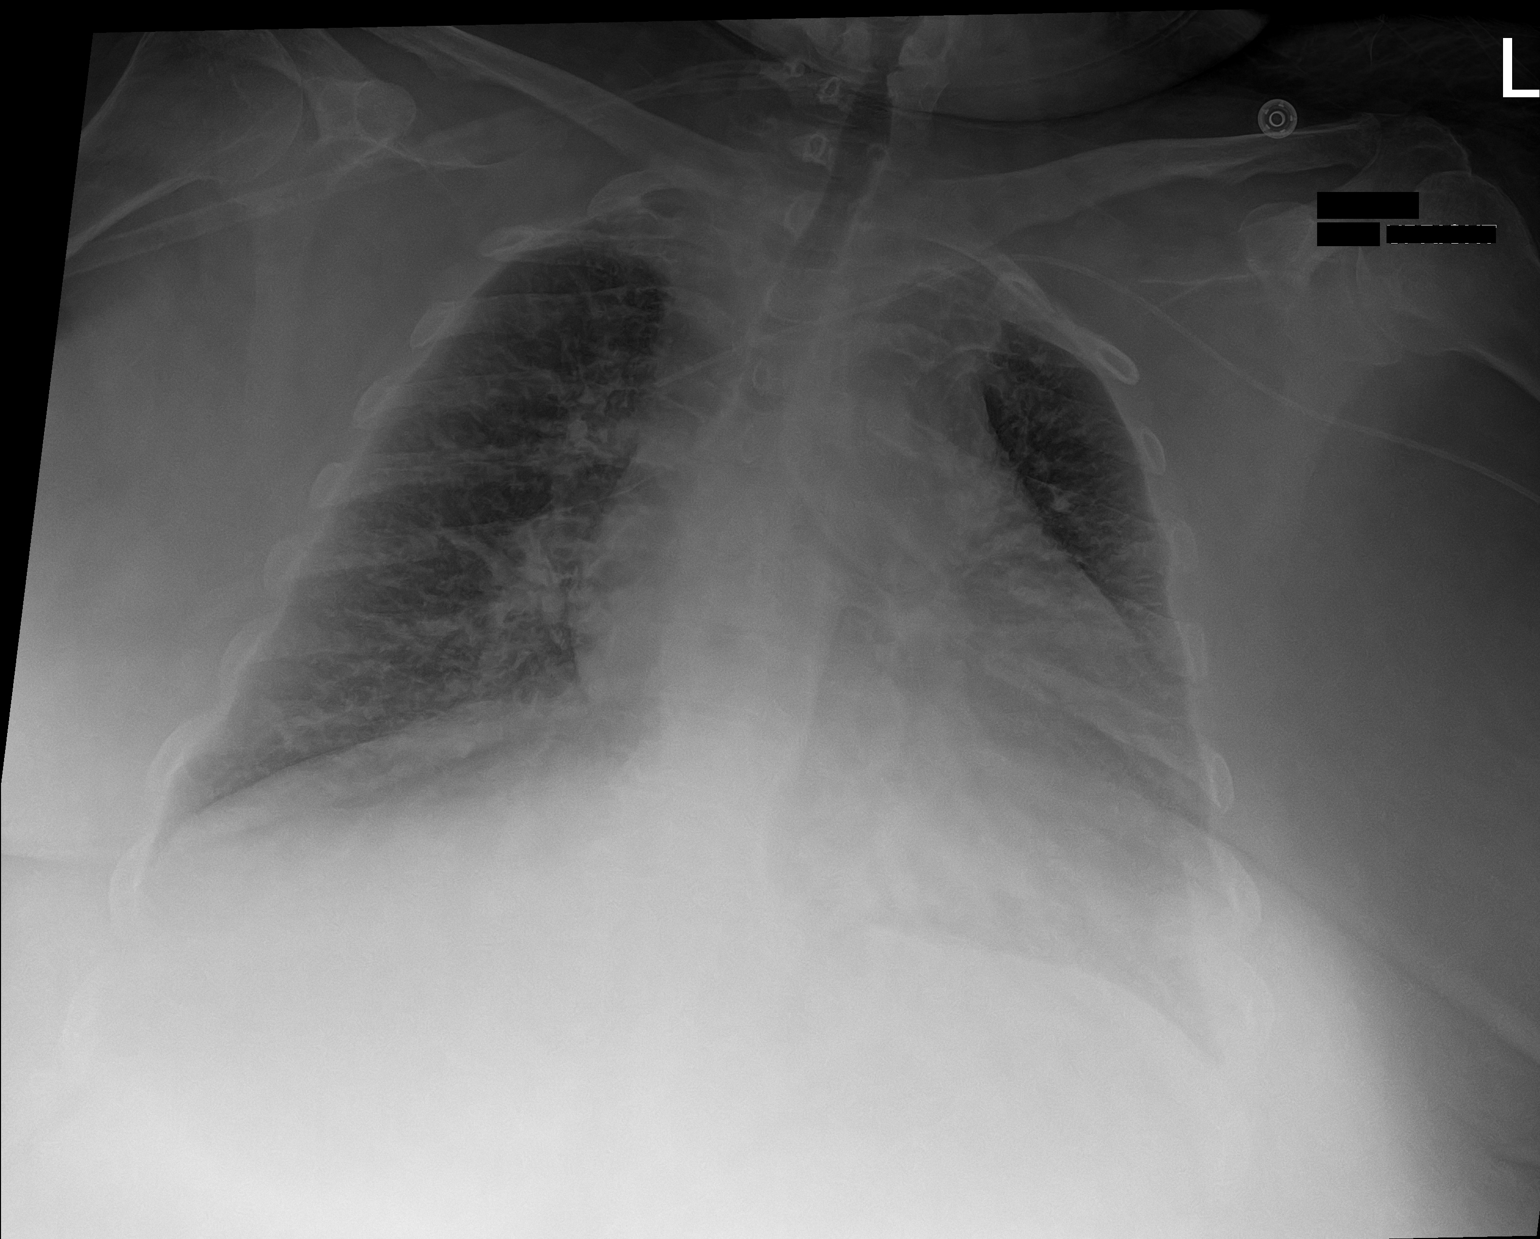

[1 of 1 positions shown; findings below may reference images not displayed]

FINDINGS: [V9] hours. The heart size and mediastinal contours are stable
allowing for portable semi erect technique and lordotic positioning.
The lungs appear clear. There is no pleural effusion or
pneumothorax. A left-sided PICC projects to the level of the azygous
vein. No osseous abnormalities are identified.
IMPRESSION: Stable mild cardiomegaly. No acute cardiopulmonary process
identified.

## 2021-11-26 IMAGING — CT CT HEAD W/O CM
3 of 4 series · 16 of 47 positions shown, 19 images · non-contrast
Comparison: [DATE]

CLINICAL DATA: Mental status change, unknown cause

EXAM:
CT HEAD WITHOUT CONTRAST
TECHNIQUE: Contiguous axial images were obtained from the base of the skull
through the vertex without intravenous contrast.

[Series 2: head w o · axial · 0.41mm/px · z∈[+27,+152]mm · 10 of 31 slices shown, 13 images]
[im 3/31  brain]
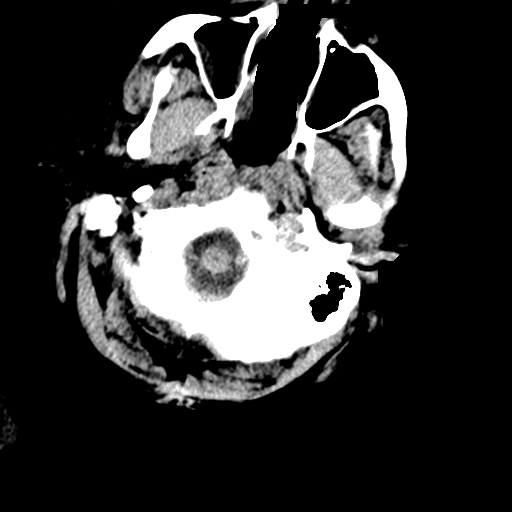
[im 3/31  bone]
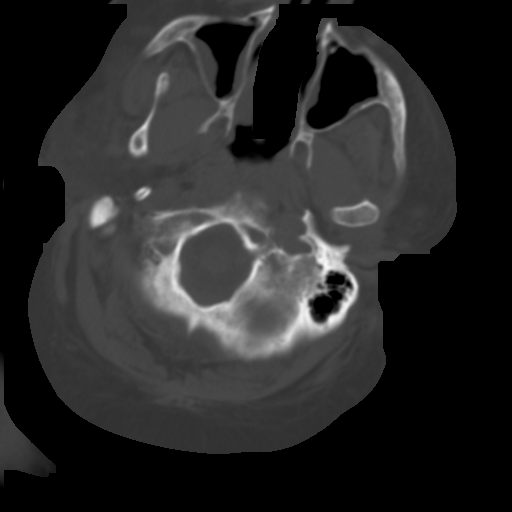
[im 5/31  brain]
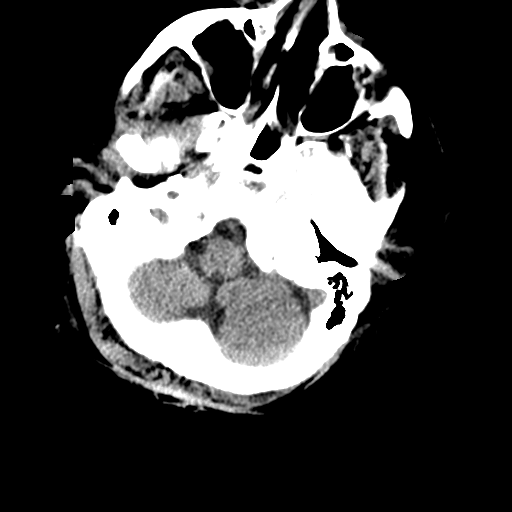
[im 9/31  brain]
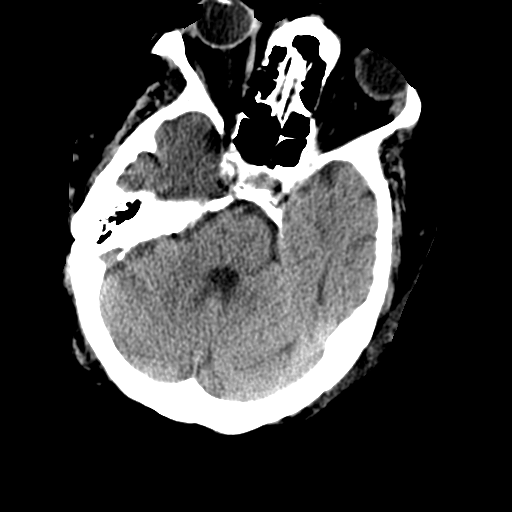
[im 11/31  brain]
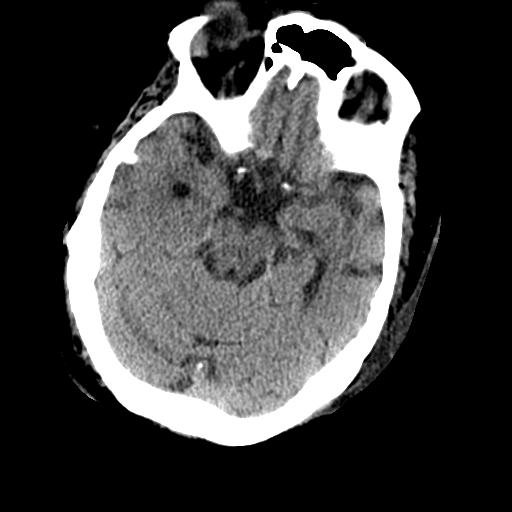
[im 13/31  brain]
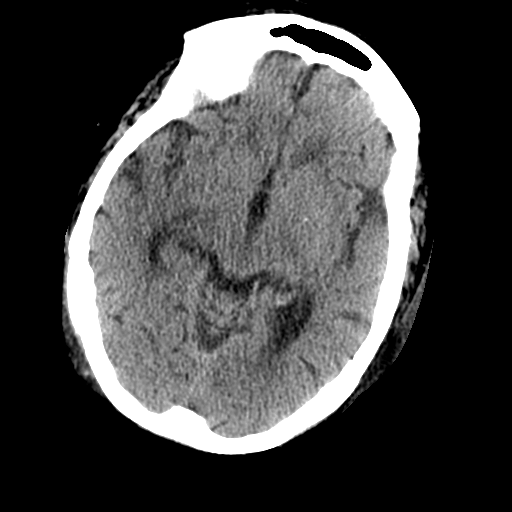
[im 13/31  bone]
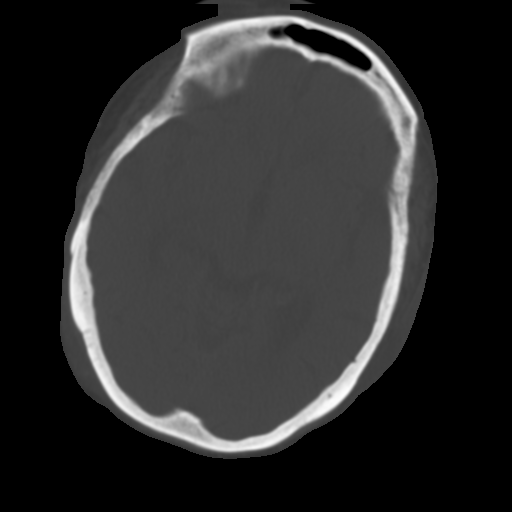
[im 18/31  brain]
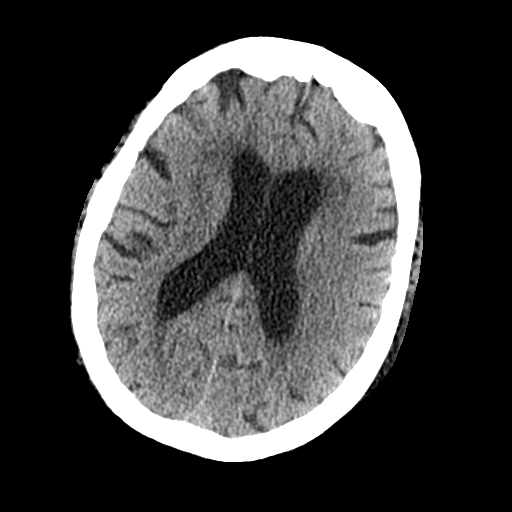
[im 20/31  brain]
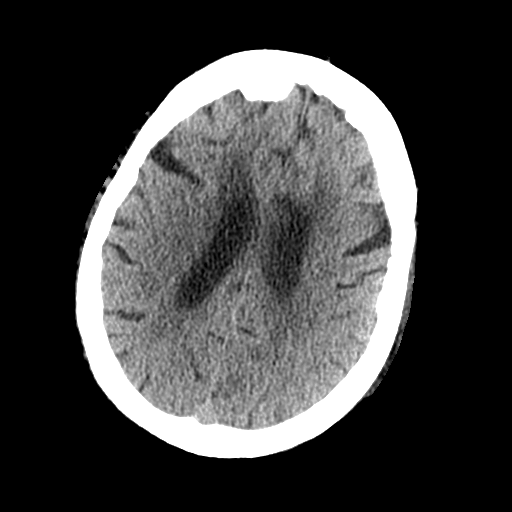
[im 22/31  brain]
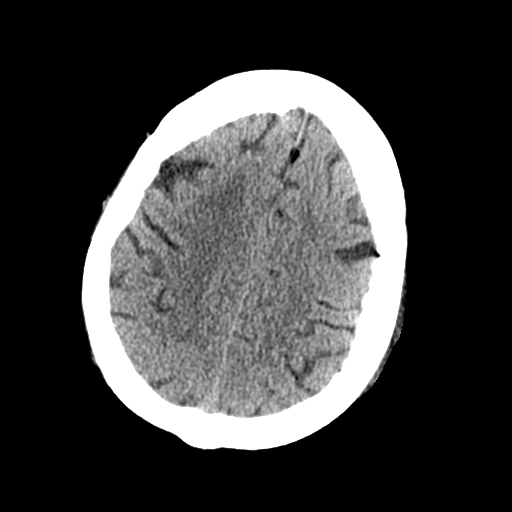
[im 26/31  brain]
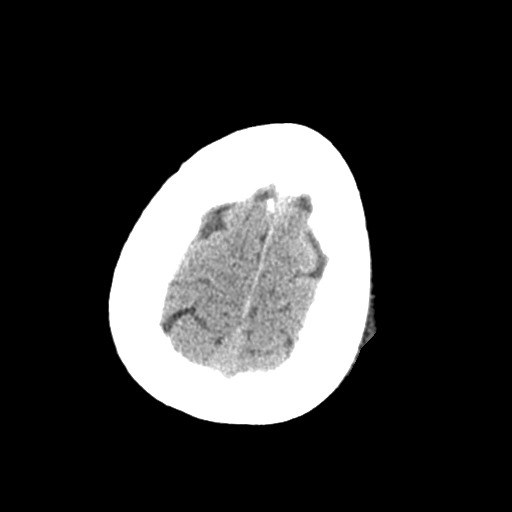
[im 26/31  bone]
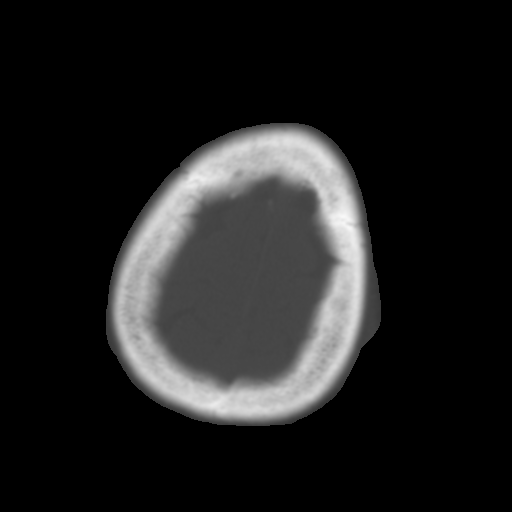
[im 28/31  brain]
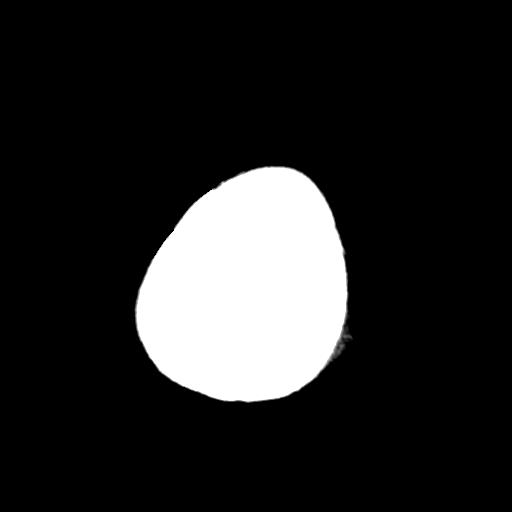

[Series 4: coronal soft · coronal · 0.35mm/px · 3 of 84 slices shown]
[im 28/84  brain]
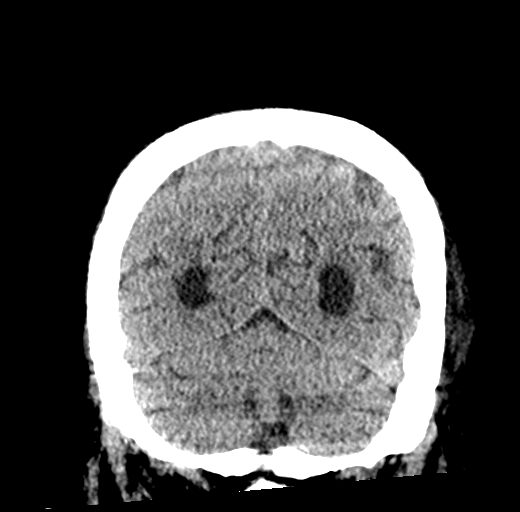
[im 37/84  brain]
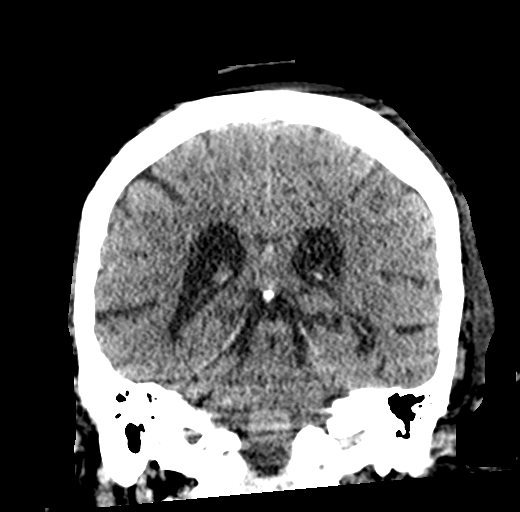
[im 47/84  brain]
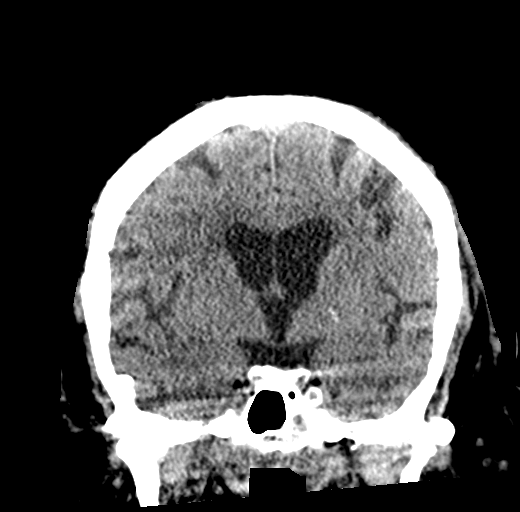

[Series 5: sagittal soft · sagittal · 0.36mm/px · 3 of 61 slices shown]
[im 24/61  brain]
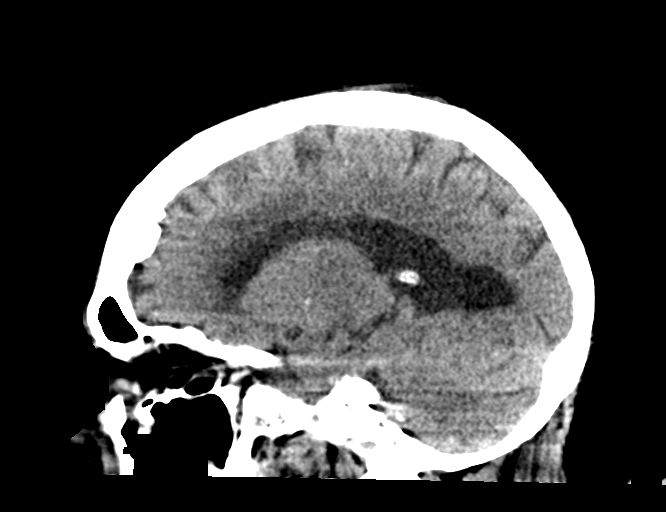
[im 31/61  brain]
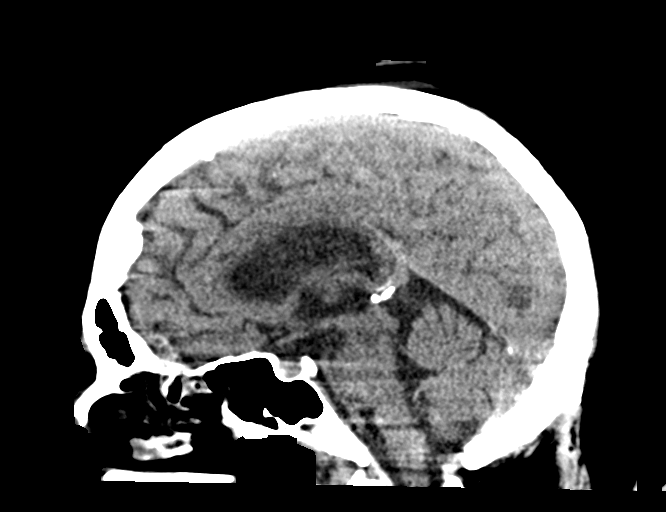
[im 37/61  brain]
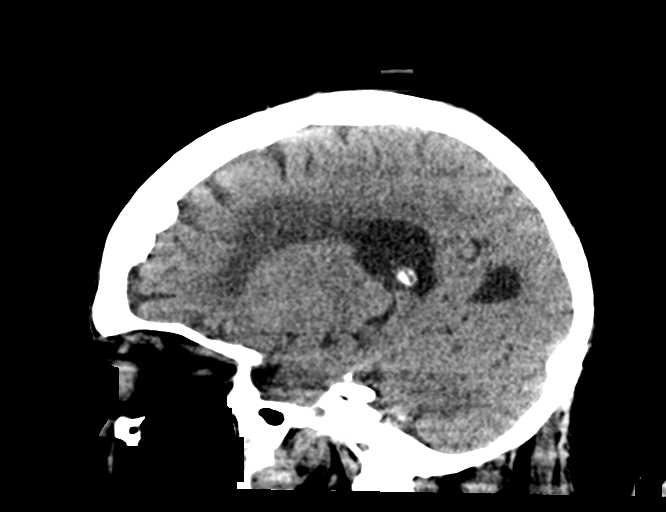

[16 of 47 positions shown; findings below may reference images not displayed]

FINDINGS: Brain: There is no acute intracranial hemorrhage, mass effect, or
edema. Gray-white differentiation is preserved. There is no
extra-axial fluid collection. Prominence of the ventricles and sulci
reflects stable parenchymal volume loss. Patchy hypoattenuation in
the supratentorial white matter is nonspecific but probably reflects
stable chronic microvascular ischemic changes.

Vascular: There is atherosclerotic calcification at the skull base.

Skull: Calvarium is unremarkable.

Sinuses/Orbits: Paranasal sinus mucosal thickening. Lens
replacements.

Other: Mild left scalp soft tissue swelling.
IMPRESSION: No acute intracranial abnormality. Stable chronic findings detailed
above.

## 2021-11-26 MED ORDER — CEFTRIAXONE IV (FOR PTA / DISCHARGE USE ONLY): 2.0000 g | INTRAVENOUS | 0 refills | Status: DC

## 2021-11-26 MED ORDER — NYSTATIN 100000 UNIT/GM EX POWD: Freq: Two times a day (BID) | CUTANEOUS | 0 refills | Status: DC

## 2021-11-26 MED ORDER — GERHARDT'S BUTT CREAM: TOPICAL_CREAM | CUTANEOUS | Status: DC

## 2021-11-26 MED ORDER — GABAPENTIN 100 MG PO CAPS: 100.0000 mg | ORAL_CAPSULE | Freq: Three times a day (TID) | ORAL | Status: DC

## 2021-11-26 MED ORDER — INSULIN GLARGINE-YFGN 100 UNIT/ML ~~LOC~~ SOLN
35.0000 [IU] | Freq: Every day | SUBCUTANEOUS | Status: DC
  Administered 2021-11-26: 22:00:00 35 [IU] via SUBCUTANEOUS
  Filled 2021-11-26 (×4): qty 0.35

## 2021-11-26 MED ORDER — VANCOMYCIN IV (FOR PTA / DISCHARGE USE ONLY): 1000.0000 mg | INTRAVENOUS | 0 refills | Status: DC

## 2021-11-26 MED ORDER — SERTRALINE HCL 50 MG PO TABS: 150.0000 mg | ORAL_TABLET | Freq: Every day | ORAL | Status: DC

## 2021-11-26 MED ORDER — AMLODIPINE BESYLATE 5 MG PO TABS: 5.0000 mg | ORAL_TABLET | Freq: Every day | ORAL | Status: DC

## 2021-11-26 MED ORDER — HYDRALAZINE HCL 25 MG PO TABS: 25.0000 mg | ORAL_TABLET | Freq: Three times a day (TID) | ORAL | Status: AC

## 2021-11-26 MED ORDER — INSULIN GLARGINE-YFGN 100 UNIT/ML ~~LOC~~ SOLN: 35.0000 [IU] | Freq: Every day | SUBCUTANEOUS | 11 refills | Status: DC

## 2021-11-26 MED ORDER — DOXYCYCLINE HYCLATE 100 MG PO CAPS: 100.0000 mg | ORAL_CAPSULE | Freq: Two times a day (BID) | ORAL | 0 refills | Status: AC

## 2021-11-26 MED ORDER — AMOXICILLIN-POT CLAVULANATE 875-125 MG PO TABS: 1.0000 | ORAL_TABLET | Freq: Two times a day (BID) | ORAL | 0 refills | Status: AC

## 2021-11-26 MED ORDER — SACCHAROMYCES BOULARDII 250 MG PO CAPS: 250.0000 mg | ORAL_CAPSULE | Freq: Two times a day (BID) | ORAL | 0 refills | Status: DC

## 2021-11-26 MED ORDER — INSULIN ASPART 100 UNIT/ML IJ SOLN: 12.0000 [IU] | Freq: Three times a day (TID) | INTRAMUSCULAR | 11 refills | Status: DC

## 2021-11-26 NOTE — Progress Notes (Signed)
°   11/26/21 1140  Assess: MEWS Score  Temp 98.9 F (37.2 C)  BP (!) 152/74  Pulse Rate 73  Resp 20  Level of Consciousness Alert  SpO2 94 %  O2 Device Nasal Cannula  Patient Activity (if Appropriate) In bed  O2 Flow Rate (L/min) 2 L/min  Assess: MEWS Score  MEWS Temp 0  MEWS Systolic 0  MEWS Pulse 0  MEWS RR 0  MEWS LOC 0  MEWS Score 0  MEWS Score Color Green  Notify: Provider  Provider Name/Title dr Wynetta Emery  Date Provider Notified 11/26/21  Time Provider Notified [id number]  Notification Type Page  Notification Reason Change in status  Provider response See new orders (ordered ABG and ammonia level)  Date of Provider Response 11/26/21  Time of Provider Response [id number]

## 2021-11-26 NOTE — Progress Notes (Signed)
Patient transferred to ICU.

## 2021-11-26 NOTE — Progress Notes (Signed)
11/26/2021 4:04 PM  Pt developed some altered mentation prior to discharge and we held DC today to check some labs and ABG and CT scan.  Her ABG shows some hypoxemia and mild pCO2 elevation, we will try temporary bipap therapy.  CT brain with no acute findings.  Transfer to SDU for continuous bipap.  Updated son and husband at bedside.  Pt is somnolent but arousable.  Family says she usually sleeps during the day and is up at night so this might just be related to her circadian rhythm being off balance.  Will follow.  Neuro checks overnight.  If stable to improved could possibly go to [facility] 12/23.    Murvin Natal, MD How to contact the [facility] Attending or Consulting provider [place] or covering provider during after hours [place], for this patient?  Check the care team in [facility] and look for a) attending/consulting [facility] provider listed and b) the [facility] team listed Log into [email or web address] and use [place]'s universal password to access. If you do not have the password, please contact the hospital operator. Locate the [facility] provider you are looking for under [facility] and page to a number that you can be directly reached. If you still have difficulty reaching the provider, please page the [facility] (Director on Call) for the Hospitalists listed on amion for assistance.

## 2021-11-26 NOTE — Progress Notes (Signed)
Subjective:  Sleeping but easily arouses. No complaints.   Objective: Vital signs in last 24 hours: Temp:  [97 F (36.1 C)-98.2 F (36.8 C)] 98 F (36.7 C) (12/22 0544) Pulse Rate:  [77-84] 77 (12/22 0544) Resp:  [18-20] 20 (12/22 0544) BP: (159-165)/(68-83) 165/78 (12/22 0544) SpO2:  [75 %-100 %] 75 % (12/22 0544) Last BM Date: 11/22/21 General:   Asleep but easily arouses. Chronically ill appearing. Morbidly obese. Oriented to place, self but not time. Abdomen:  Soft, obese. Exam limited by body habitus  Normal bowel sounds, without guarding, and without rebound.   Extremities:  Without clubbing, deformity or edema. Neurologic:  Alert and  oriented 2. Skin:  multiple wounds to both legs with una boots in place.    Intake/Output from previous day: 12/21 0701 - 12/22 0700 In: 120 [P.O.:120] Out: -  Intake/Output this shift: No intake/output data recorded.  Lab Results: CBC Recent Labs    11/24/21 0527 11/25/21 0219  WBC 11.3* 10.9*  HGB 11.5* 11.0*  HCT 36.2 34.1*  MCV 81.2 82.6  PLT 371 330   BMET Recent Labs    11/24/21 0527 11/25/21 0219 11/26/21 0518  NA 142 138 140  K 3.1* 3.5 3.7  CL 104 103 102  CO2 29 27 30   GLUCOSE 124* 260* 118*  BUN 18 18 16   CREATININE 1.36* 1.20* 1.20*  CALCIUM 8.6* 8.3* 8.7*   LFTs Recent Labs    11/24/21 0527 11/25/21 0219  BILITOT 0.6 0.4  ALKPHOS 116 144*  AST 280* 224*  ALT 120* 115*  PROT 6.4* 6.3*  ALBUMIN 2.2* 2.2*   No results for input(s): LIPASE in the last 72 hours. PT/INR Recent Labs    11/25/21 1845  LABPROT 16.4*  INR 1.3*      Imaging Studies: CT ABDOMEN PELVIS WO CONTRAST  Result Date: 11/21/2021 CLINICAL DATA:  Left lower quadrant abdominal pain. EXAM: CT ABDOMEN AND PELVIS WITHOUT CONTRAST TECHNIQUE: Multidetector CT imaging of the abdomen and pelvis was performed following the standard protocol without IV contrast. COMPARISON:  CT abdomen and pelvis 09/24/2017. FINDINGS: Lower chest: No  acute abnormality. Hepatobiliary: No focal liver abnormality is seen. Status post cholecystectomy. Pancreas: Atrophic. Spleen: Mildly enlarged, unchanged. Adrenals/Urinary Tract: There is a 16 mm cyst in the right kidney. Otherwise, the adrenal glands, kidneys and bladder are within normal limits. Stomach/Bowel: Stomach is within normal limits. There is distal esophageal wall thickening. Appendix appears normal. No evidence of bowel wall thickening, distention, or inflammatory changes. Vascular/Lymphatic: Aortic atherosclerosis. No enlarged abdominal or pelvic lymph nodes. Reproductive: Uterus and bilateral adnexa are unremarkable. Other: There is no ascites or focal abdominal wall hernia. There is mild body wall edema. There is skin thickening measuring up to 16 mm in the left anterior abdominal wall. No focal fluid collection or soft tissue gas. Musculoskeletal: Multilevel degenerative changes affect the spine IMPRESSION: 1. Skin thickening in the left anterior abdominal wall. Correlate clinically for infection. No evidence for abscess or soft tissue gas. 2. Mild splenomegaly. 3. Wall thickening of the distal esophagus concerning for esophagitis. Recommend clinical correlation follow-up to exclude underlying lesion. 4.  Aortic Atherosclerosis (ICD10-I70.0). Electronically Signed   By: Ronney Asters M.D.   On: 11/21/2021 21:18   DG Tibia/Fibula Right  Result Date: 11/06/2021 CLINICAL DATA:  Fall, ulcers EXAM: RIGHT TIBIA AND FIBULA - 2 VIEW; RIGHT FOOT - 2 VIEW; LEFT FOOT - 2 VIEW COMPARISON:  None. FINDINGS: Right tibia, fibula and right foot: No evidence of fracture  or dislocation. Vascular calcifications. Soft tissue swelling seen about the right first MTP joint. No osseous destruction or periosteal reaction. No significant degenerative changes. Left foot: No evidence fracture or dislocation. Vascular calcifications. Ulcer the superficial soft tissues overlying the first MTP joint. No osseous destruction or  periosteal reaction. No significant degenerative changes. IMPRESSION: No evidence of fracture or osteomyelitis. Electronically Signed   By: Yetta Glassman M.D.   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 EXAM: CT OF THE LOWER LEFT EXTREMITY WITH CONTRAST TECHNIQUE: Multidetector CT imaging of the lower left extremity was performed according to the standard protocol following intravenous contrast administration. CONTRAST:  15mL OMNIPAQUE IOHEXOL 300 MG/ML  SOLN COMPARISON:  None. FINDINGS: Bones/Joint/Cartilage No cortical erosion or periosteal reaction. No evidence of fracture or dislocation. Degenerative changes at the talonavicular and calcaneonavicular joint space subchondral cystic changes and minimal osteophytes. Subtalar and tibiotalar joints are intact. Ligaments Suboptimally assessed by CT. Muscles and Tendons Muscles are normal in bulk. No evidence of intramuscular fluid collection or abscess. Soft tissues There is marked skin thickening and subcutaneous soft tissue edema about the distal aspect of the foot. There is a deep skin wound about the plantar aspect of the first metatarsophalangeal joint without evidence of drainable fluid collection or abscess. IMPRESSION: 1. Skin thickening and subcutaneous soft tissue edema about the forefoot consistent with cellulitis. 2. Deep skin wound about the plantar aspect of the first metatarsophalangeal joint. No cortical erosion or periosteal reaction concerning for osteomyelitis. Evaluation of early osteomyelitis is however limited on CT scans. If there is persistent clinical concern MR examination could be obtained for further evaluation. Electronically Signed   By: Keane Police D.O.   On: 11/23/2021 16:30   CT TIBIA FIBULA RIGHT W CONTRAST  Result Date: 11/23/2021 CLINICAL DATA:  Leg swelling and sores. EXAM: CT OF THE LOWER RIGHT EXTREMITY WITH CONTRAST  TECHNIQUE: Multidetector CT imaging of the lower right extremity was performed according to the standard protocol following intravenous contrast administration. CONTRAST:  41mL OMNIPAQUE IOHEXOL 300 MG/ML  SOLN COMPARISON:  75 cc Omnipaque 300 FINDINGS: Bones/Joint/Cartilage Knee osteoarthritis especially affecting the patellofemoral compartment. No bony destructive findings in the tibial or fibular characteristic of osteomyelitis. Small knee effusion without a substantial degree of synovitis. Posterior meniscal chondrocalcinosis versus free osteochondral fragment along the posterior horn medial meniscus. Plantar and Achilles calcaneal spurs. Accessory navicular noted. Small Baker's cyst noted. Ligaments Suboptimally assessed by CT. Muscles and Tendons No intramuscular abscess or hematoma. Soft tissues Subcutaneous edema at the level of the knee is primarily anterior and lateral. This tracks distally in the calf and is more confluent in the distal calf and in the ankle region where the appearance is more circumferential. The cause of this subcutaneous edema is nonspecific. No subcutaneous abscess is present. There are scattered irregular subcutaneous calcifications compatible with venous insufficiency. Atherosclerotic calcification of arterial vascular structures also noted. Today's exam is not a CT angiogram and does not adequately assess patency of the arterial vessels distal to the popliteal artery. I do not see a definite large cutaneous ulceration or gas tracking in the subcutaneous tissues. IMPRESSION: 1. No findings of osteomyelitis or drainable abscess. 2. Subcutaneous edema especially in the distal calf and ankle region. Cellulitis not excluded. No gas in the soft tissues. 3. Subcutaneous calcifications along the calf compatible with chronic venous insufficiency. 4. Small knee effusion and small Baker's cyst. No substantial synovitis.  5. Posterior horn medial meniscus chondrocalcinosis versus small adjacent  free osteochondral fragment. 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 6. Plantar and Achilles calcaneal spurs. Electronically Signed   By: Van Clines M.D.   On: 11/23/2021 16:54   MR FOOT RIGHT WO CONTRAST  Result Date: 11/23/2021 CLINICAL DATA:  Foot swelling, diabetic, osteomyelitis suspected. No prior imaging. EXAM: MRI OF THE RIGHT FOREFOOT WITHOUT CONTRAST TECHNIQUE: Multiplanar, multisequence MR imaging of the right was performed. No intravenous contrast was administered. COMPARISON:  Radiographs dated November 21, 2021. FINDINGS: Bones/Joint/Cartilage No fracture or dislocation. Normal alignment. No joint effusion. No marrow signal abnormality. Ligaments Collateral ligaments are intact.  Lisfranc ligament is intact. Muscles and Tendons Flexor, peroneal and extensor compartment tendons are intact. 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 No drainable fluid collection or abscess. Soft tissue Skin thickening and prominent subcutaneous soft tissue edema about the dorsal and plantar aspect of the forefoot. No drainable fluid collection or abscess. No soft tissue mass. IMPRESSION: 1.  No MR evidence of acute osteomyelitis. 2. Skin thickening and subcutaneous soft tissue edema consistent with cellulitis. No drainable fluid collection or abscess. Electronically Signed   By: Keane Police D.O.   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 EXAM: MRI OF THE LEFT FOOT WITHOUT CONTRAST TECHNIQUE: Multiplanar, multisequence MR imaging of the left was performed. No intravenous contrast was administered. COMPARISON:  Radiographs dated November 21, 2021 FINDINGS: Nondiagnostic examination due to significant motion. Subcutaneous soft tissue edema. IMPRESSION: Nondiagnostic examination due to motion. Subcutaneous soft tissue edema.  Electronically Signed   By: Keane Police D.O.   On: 11/23/2021 11:37   US Abdomen Limited  Result Date: 11/22/2021 CLINICAL DATA:  Elevated liver enzymes. Gallbladder surgically absent. EXAM: ULTRASOUND ABDOMEN LIMITED RIGHT UPPER QUADRANT COMPARISON:  CT abdomen and pelvis, 11/21/2021. FINDINGS: Gallbladder: Surgically absent. Common bile duct: Diameter: 4 mm Liver: Suboptimally visualized. Normal overall size. No convincing mass. Portal vein is patent on color Doppler imaging with normal direction of blood flow towards the liver. Other: None. IMPRESSION: 1. Significantly limited study due to the patient's body habitus. 2. No acute findings. No bile duct dilation. Liver not well assessed, better visualized on the previous day's CT. Electronically Signed   By: Lajean Manes M.D.   On: 11/22/2021 09:01   US Venous Img Lower Bilateral (DVT)  Result Date: 11/23/2021 CLINICAL DATA:  Bilateral lower extremity chronic pain and edema EXAM: BILATERAL LOWER EXTREMITY VENOUS DOPPLER ULTRASOUND TECHNIQUE: Gray-scale sonography with graded compression, as well as color Doppler and duplex ultrasound were performed to evaluate the lower extremity deep venous systems from the level of the common femoral vein and including the common femoral, femoral, profunda femoral, popliteal and calf veins including the posterior tibial, peroneal and gastrocnemius veins when visible. The superficial great saphenous vein was also interrogated. Spectral Doppler was utilized to evaluate flow at rest and with distal augmentation maneuvers in the common femoral, femoral and popliteal veins. COMPARISON:  None. FINDINGS: RIGHT LOWER EXTREMITY Common Femoral Vein: No evidence of thrombus. Normal compressibility, respiratory phasicity and response to augmentation. Saphenofemoral Junction: No evidence of thrombus. Normal compressibility and flow on color Doppler imaging. Profunda Femoral Vein: No evidence of thrombus. Normal compressibility and  flow on color Doppler imaging. Femoral Vein: No evidence of thrombus. Normal compressibility, respiratory phasicity and response to augmentation. Popliteal Vein: No evidence of thrombus.  Normal compressibility, respiratory phasicity and response to augmentation. Calf Veins: No evidence of thrombus. Normal compressibility and flow on color Doppler imaging. Other Findings: Right popliteal fossa minimally complex thick-walled Baker's cyst measures 4.5 x 1.4 x 2.9 cm LEFT LOWER EXTREMITY Common Femoral Vein: No evidence of thrombus. Normal compressibility, respiratory phasicity and response to augmentation. Saphenofemoral Junction: No evidence of thrombus. Normal compressibility and flow on color Doppler imaging. Profunda Femoral Vein: No evidence of thrombus. Normal compressibility and flow on color Doppler imaging. Femoral Vein: No evidence of thrombus. Normal compressibility, respiratory phasicity and response to augmentation. Popliteal Vein: No evidence of thrombus. Normal compressibility, respiratory phasicity and response to augmentation. Calf Veins: No evidence of thrombus. Normal compressibility and flow on color Doppler imaging. IMPRESSION: No significant DVT demonstrated in either extremity. Limited exam because of obesity. 4.5 cm right knee Baker's cyst. Electronically Signed   By: Jerilynn Mages.  Shick M.D.   On: 11/23/2021 14:10   US ARTERIAL ABI (SCREENING LOWER EXTREMITY)  Result Date: 11/23/2021 CLINICAL DATA:  60 year old female with a history bilateral wounds EXAM: NONINVASIVE PHYSIOLOGIC VASCULAR STUDY OF BILATERAL LOWER EXTREMITIES TECHNIQUE: Evaluation of both lower extremities was performed at rest, including calculation of ankle-brachial indices, multiple segmental pressure evaluation, segmental Doppler and segmental pulse volume recording. COMPARISON:  None. FINDINGS: Right ABI:  0.96 Left ABI:  1.35 Right Lower Extremity: Segmental Doppler at the right ankle demonstrates monophasic waveforms Left Lower  Extremity: Segmental Doppler at the left ankle demonstrates monophasic waveforms IMPRESSION: Resting ABI the bilateral lower extremity within normal limits, though could be falsely elevated given the segmental exam. Segmental Doppler at the bilateral ankles demonstrates monophasic waveforms. Signed, Dulcy Fanny. Dellia Nims, RPVI [facility] Electronically Signed   By: Corrie Mckusick D.O.   On: 11/23/2021 11:21   DG Foot 2 Views Left  Result Date: 11/06/2021 CLINICAL DATA:  Fall, ulcers EXAM: RIGHT TIBIA AND FIBULA - 2 VIEW; RIGHT FOOT - 2 VIEW; LEFT FOOT - 2 VIEW COMPARISON:  None. FINDINGS: Right tibia, fibula and right foot: No evidence of fracture or dislocation. Vascular calcifications. Soft tissue swelling seen about the right first MTP joint. No osseous destruction or periosteal reaction. No significant degenerative changes. Left foot: No evidence fracture or dislocation. Vascular calcifications. Ulcer the superficial soft tissues overlying the first MTP joint. No osseous destruction or periosteal reaction. No significant degenerative changes. IMPRESSION: No evidence of fracture or osteomyelitis. Electronically Signed   By: Yetta Glassman M.D.   On: 11/06/2021 15:32   DG Foot 2 Views Right  Result Date: 11/06/2021 CLINICAL DATA:  Fall, ulcers EXAM: RIGHT TIBIA AND FIBULA - 2 VIEW; RIGHT FOOT - 2 VIEW; LEFT FOOT - 2 VIEW COMPARISON:  None. FINDINGS: Right tibia, fibula and right foot: No evidence of fracture or dislocation. Vascular calcifications. Soft tissue swelling seen about the right first MTP joint. No osseous destruction or periosteal reaction. No significant degenerative changes. Left foot: No evidence fracture or dislocation. Vascular calcifications. Ulcer the superficial soft tissues overlying the first MTP joint. No osseous destruction or periosteal reaction. No significant degenerative changes. IMPRESSION: No evidence of fracture or  osteomyelitis. Electronically Signed   By: Yetta Glassman M.D.   On: 11/06/2021 15:32   DG Foot Complete Left  Result Date: 11/21/2021 CLINICAL DATA:  Wounds to both feet x2 weeks. EXAM: LEFT FOOT - COMPLETE 3+ VIEW COMPARISON:  November 06, 2021 FINDINGS: There is no evidence of fracture or dislocation. There is no evidence of  arthropathy or other focal bone abnormality. A 1.2 cm superficial soft tissue ulceration is seen along the plantar aspect of the distal left foot. Moderate severity soft tissue swelling is also seen within this region. IMPRESSION: Plantar soft tissue ulceration and associated soft tissue swelling, as described above, without an acute osseous abnormality. Electronically Signed   By: Virgina Norfolk M.D.   On: 11/21/2021 21:33   DG Foot Complete Right  Result Date: 11/21/2021 CLINICAL DATA:  Wounds to both feet x2 weeks. EXAM: RIGHT FOOT COMPLETE - 3+ VIEW COMPARISON:  November 06, 2021 FINDINGS: There is no evidence of fracture or dislocation. There is no evidence of arthropathy or other focal bone abnormality. Mild to moderate severity vascular calcification is seen. Mild soft tissue swelling is seen along the plantar aspect of the distal right foot. IMPRESSION: Plantar soft tissue swelling, as described above, without an acute osseous abnormality. Electronically Signed   By: Virgina Norfolk M.D.   On: 11/21/2021 21:31   IR PICC PLACEMENT LEFT >5 YRS INC IMG GUIDE  Result Date: 11/25/2021 INDICATION: Patient with history of osteomyelitis left foot, cellulitis requiring long-term IV antibiotic therapy. Request to IR for PICC placement for durable venous access. EXAM: LEFT UPPER EXTREMITY PICC LINE PLACEMENT WITH ULTRASOUND AND FLUOROSCOPIC GUIDANCE MEDICATIONS: 3 mL 1% lidocaine ANESTHESIA/SEDATION: None. FLUOROSCOPY TIME:  Fluoroscopy Time: 0 minutes 24 seconds (16.5 mGy). COMPLICATIONS: None immediate. PROCEDURE: The patient was advised of the possible risks and  complications and agreed to undergo the procedure. The patient was then brought to the angiographic suite for the procedure. The left arm was prepped with chlorhexidine, draped in the usual sterile fashion using maximum barrier technique (cap and mask, sterile gown, sterile gloves, large sterile sheet, hand hygiene and cutaneous antisepsis) and infiltrated locally with 1% Lidocaine. Ultrasound demonstrated patency of the left basilic vein, and this was documented with an image. Under real-time ultrasound guidance, this vein was accessed with a 21 gauge micropuncture needle and image documentation was performed. A 0.018 wire was introduced in to the vein. Over this, a 5 [place] single lumen power injectable PICC was advanced to the lower SVC/right atrial junction. Fluoroscopy during the procedure and fluoro spot radiograph confirms appropriate catheter position. The catheter was flushed, aspirated, and covered with a sterile dressing. Catheter length: 42 cm IMPRESSION: Successful left arm power PICC line placement with ultrasound and fluoroscopic guidance. The catheter is ready for use. Read by Candiss Norse, PA-C Electronically Signed   By: Ruthann Cancer M.D.   On: 11/25/2021 13:54   [place] EKG SITE RITE  Result Date: 11/23/2021 If Site Rite image not attached, placement could not be confirmed due to current cardiac rhythm. [2 weeks]   Assessment:  60 y/o female with OSA, DM type 2, HTN, depression presenting on 12/18 with generalized weakness, nausea, dizziness, vomiting. Found to have elevated LFTs and noted splenomegaly on imaging, though liver not well seen. GI consulted for further evaluation.   Elevated LFTs: new onset, previously normal in 2021. AST rose to 286, down to 224 today. ALT rose to 120 down to 115 today. AP rose to 144. Tbili has been normal.  No LFTS available today. Also with WBC 18000, lactic acid of 2. CMV negative. EBV pending. Other serologies pending for autoimmune processes.  Acute viral hepatitis markers negative.   Suspected elevated LFTs, likely drug-induced in setting of antibiotics for recurrent cellulitis, possible NASH. Started on Keflex and Doxycycline 12/2 for cellulitis.   Mild anemia: dark stools for few  days. ?black. Poor historian. No brbpr. Complains of epigastric pain, exam benign. Ferritin 75, iron 15, TIBC 183, sat 8. No prior EGD/colonoscopy.    Plan: Follow LFTs, CBC as outpatient as patient is being discharged today.  Follow pending serologies.  Continue PPI.  Close follow up as outpatient.   Laureen Ochs. Bernarda Caffey [facility] [phone] 12/22/202211:37 AM    LOS: 5 days

## 2021-11-26 NOTE — TOC Transition Note (Addendum)
Transition of Care [facility]) - CM/SW Discharge Note   Patient Details  Name: [name] MRN: [id number] Date of Birth: 1961/02/08  [facility] CM/SW Contact:  Shade Flood, LCSW Phone Number: 11/26/2021, 11:26 AM   Clinical Narrative:     Pt stable for dc to [facility] today per MD. Updated Jackelyn Poling at [facility] who states pt can admit there today. Spoke with pt's husband who remains in agreement with the dc plan.  DC clinical sent electronically. RN to call report. EMS transport arranged.  There are no other TOC needs for dc.  1428: Received update from MD that pt having mental status changes and he is cancelling the dc. Updated EMS and [place]. Will follow up in AM.  Final next level of care: [place] Barriers to Discharge: Barriers Resolved   Patient Goals and CMS Choice Patient states their goals for this hospitalization and ongoing recovery are:: get better CMS Medicare.gov Compare Post Acute Care list provided to:: Patient Choice offered to / list presented to : Patient  Discharge Placement PASRR number recieved: 11/24/21            Patient chooses bed at: Other - please specify in the comment section below: ([facility]) Patient to be transferred to facility by: EMS Name of family member notified: Edsel Mcguire Patient and family notified of of transfer: 11/26/21  Discharge Plan and Services In-house Referral: Clinical Social Work   Post Acute Care Choice: [facility]                               Social Determinants of Health (SDOH) Interventions     Readmission Risk Interventions Readmission Risk Prevention Plan 11/23/2021  Transportation Screening Complete  Medication Review (RN CM) Complete  Some recent data might be hidden

## 2021-11-26 NOTE — Discharge Summary (Addendum)
Physician Discharge Summary  [name] BSW:[id number] DOB: [birth year] DOA: 11/21/2021  PCP: Bridget Hartshorn, NP  Admit date: 11/21/2021 Discharge date: 11/27/2021  Disposition:  SNF   Recommendations for Outpatient Follow-up:   RECOMMEND THAT PATIENT WEAR CPAP WHILE SLEEPING IF NOT SHOULD WEAR SUPPLEMENTAL OXYGEN.   AMBULATORY REFERRAL FOR SLEEP STUDY MADE  Skin Care Instructions:  Skin care to intertriginous areas of dermatitis: abdominal skin folds, inguinal areas, inframammary areas:  Cleanse with soap and water, rinse and pat dry. Insert house antimicrobial wicking textile [name] # [phone]) according to the instructions below:  Measure and cut length of InterDry to fit in skin folds that have skin breakdown  Tuck InterDry fabric into skin folds in a single layer, allow for 2 inches of overhang from skin edges to allow for wicking to occur  May remove to bathe; dry area thoroughly and then tuck into affected areas again  Do not apply any creams or ointments when using InterDry  DO NOT THROW AWAY FOR 5 DAYS unless soiled with stool  DO NOT [facility] product, this will inactivate the silver in the material  New sheet of Interdry should be applied after 5 days of use if patient continues to have skin breakdown  Wound Care Instructions:  Wound care to left lateral LE full thickness wounds:  Cleanse with soap and water, rinse with NS, pat dry. Cover lateral aspect of LE with full thickness wound with folded layers of xeroform gauze [name] # [id number]), top with ABD pad and secure with Kerlix roll gauze/paper tape.  Wound care to partial thickness wounds at right knee, left knee and left lateral/posterior LE:  cleanse with NS, and pat dry. Paint with betadine swabstick and allow to air-dry. No dressing. Perform twice daily.  Wound care to bilateral foot chronic ulcerations to 1st digits at plantar aspect for 11 days: Wash feet with soap and water, rinse and pat dry,  particularly between digits. Cover the chronic calluses/ulcerations with folded pieces of xeroform gauze, top with dry gauze and secure with a few turns of Kerlix roll gauze/paper tape. Place feet into [employer].  Please continue nocturnal oxygen nasal cannula 3L/min.  Pt has been referred for outpatient sleep study.    Please check blood sugars at least 4-5 times per day and adjust insulin therapy as needed for glycemic control.    Patient has been referred to a podiatrist.    Please continue IV antibiotics thru 12/20/21 then on 12/21/21 please start oral antibiotics doxycycline and augmentin x 2 weeks.   Please recheck liver function tests in 2 weeks.  Please arrange outpatient follow up with [facility] in 1 month.   RECOMMEND OUTPATIENT PALLIATIVE MEDICINE CONSULTATION   Discharge Condition: STABLE   CODE STATUS: FULL DIET: heart healthy carb modified    Brief Hospitalization Summary: Please see all hospital notes, images, labs for full details of the hospitalization. Brief History:  60 year old female with a history of OSA, diabetes mellitus type 2, hypertension, depression, morbid obesity presenting with generalized weakness with associated nausea and vomiting for the past 2 days.  Unfortunately, the patient is a difficult historian at best.  In addition, I also try to contact the patient's spouse at home who is also a difficult historian at best. As best as I was able to decipher, it appears that the patient has had generalized weakness, dizziness, and nausea for the better part of the least the past 2 weeks.  It appears that for the past  5-6 days her symptoms have worsened to the point where she has been essentially unable to get out of bed.  Her spouse states that he has had to help her get out of bed, but he is having difficulty due to his own medical issues.  Occasionally, the patient has been soiling herself in bed.  Prior to her acute illness, the patient was able to make  transfers and walk with a walker, but her functional status has significantly declined over the past 2 weeks.  The patient endorses nausea and vomiting for the past 2 days prior to admission.  She has had some loose stools but denies any frank diarrhea, hematochezia, melena.  She denies any chest pain, shortness breath, coughing, hemoptysis.  There is been no fevers, chills, headache, neck pain.   Apparently, the patient visited the emergency department on 11/06/2021 for lower extremity pain.  She was diagnosed with cellulitis and sent home with doxycycline which she has taken.  She states that she chronically has lower extremity pain for which she takes gabapentin.  She is not really able to clarify whether she has had any worsening drainage, erythema, or edema in her bilateral legs.  Unfortunately her husband cannot clarify the situation any further.  When asked about previous wound care, the patient and spouse do not remember have any chronic wound care, but review of the medical record shows that the patient has been to the wound care center to see Dr. Dellia Nims back in November 2021.   Lastly, the patient and spouse seem to have some poor insight regarding her the patient's insulin usage.  Spouse has to assist the patient with her insulin, but he also states that the patient takes her medications independently.  There is much conflicting information regarding exactly how much insulin the patient is taking.  However review of the medical record shows that she had a recent PCP visit for which was documented that she was taking 70/30 insulin 100 units twice daily.  The patient currently states that she takes 70/3060 units twice daily with Humalog R in the mornings and with supper.  He is not really able to clarify exactly how much Humalog she takes.   In the emergency department, the patient was afebrile hemodynamically stable with oxygen saturation 99% room air.  BMP showed sodium 141, potassium 4.0,  bicarbonate 32, serum creatinine 1.27.  AST is 177, ALT 55, alkaline phosphatase 118, total bilirubin 1.1.  WBC 18.8, hemoglobin 14.5, platelets 450,000.  Lactic acid 2.0.  The patient was started on vancomycin.  She was given a 1 L bolus of normal saline.  PICC line was placed on 11/25/21.   Pt to continue IV antibiotics thru 12/20/21 then on 12/21/21 start oral doxycycline and augmentin for 2 weeks.      Assessment/Plan: Diabetic foot infection/Osteomyelitis left foot -Continue vancomycin and ceftriaxone -Check ABIs>>Right ABI:  0.96;  Left ABI:  1.35 -Wound care consult for numerous lower extremity wounds -ESR 85 -CRP--21.5 -Personally reviewed x-rays of the foot--no erosions noted -MR Right Foot>>no osteomyelitis, no abscess -MR LEFT Foot>>limited by movement -try CT of LEFT FOOT--No cortical erosion or periosteal reaction concerning for osteomyelitis; no abscess -general surgery consult appreciated>>debrided left foot ulcer -CT right tib/fib--no abscess, no osteo; sm knee effusion -despite no radiographic evidence of osteomyelitis>>wound probes to bone = contiguous focus osteomyelitis -PICC placed on 11/25/21 with IR at [facility].   -plan vanc/ceftrixone x 4 weeks (last day 12/20/21) -then po doxy+amox/clav x 2 weeks thereafter (start  12/21/21) -pt agrees for SNF for wound care and abx - Please see above wound care instructions.     Uncontrolled diabetes mellitus type 2 with hyperglycemia -The patient had anion gap 12 with ketonuria but normal bicarbonate -Treated with IV fluids -Continue Semglee (Glargine) 40 units daily  -12/17  A1c--9.9%  -Continue novolog 12 units with meals, if eats 50% or more of meal.    Abdominal wall cellulitis/Cellulitis leg -Antibiotics as discussed above -Wound care consult appreciated -overall improved   Transaminasemia -Holding statin -suspect hepatic steatosis given morbid obesity  -11/21/2021 CT abd--skin thickening left anterior abdominal wall without  abscess.  Mild splenomegaly.  Atrophic pancreas.  Status postcholecystectomy.  No bowel wall thickening. -RUQ ultrasound--nondiagnostic due to body habitus -Viral hepatitis panel negative -Urine drug screen -EBV DNA--pending -CMV DNA--neg -GI consulted and will have patient follow up with GI outpatient  -please recheck LFTs in 2 weeks    Acute kidney injury - TREATED AND RESOLVED  -Baseline creatinine 0.8-1.1 -Serum creatinine peaked at 1.54 -resolved after IVF   Pyuria -UA 11-20 WBC -TREATED    Acute metabolic encephalopathy -The patient remains confused intially -Secondary to infectious process -RESOLVED    Lactic acidosis - TREATED AND RESOLVED  -Secondary to volume depletion -treated with IV fluids   GERD -continue PPI   Morbid Obesity -BMI 55.40 -lifestyle modification   Hypokalemia - TREATED  -repleted -check mag--1.8  TRANSIENT DELIRIUM - resolved now  - secondary to prolonged hospitalization and noncompliance with CPAP - pt needs to wear CPAP when sleeping.     Family Communication:   spouse updated  at bedside 12/20, 12/21, 12/22     Consultants:  general surgery, spoke with Dr. Arnoldo Morale on 12/21 for updates   Code Status:  FULL    DVT Prophylaxis:  [place] Lovenox  Discharge Diagnoses:  Principal Problem:   Lower extremity cellulitis Active Problems:   Essential hypertension   COPD (chronic obstructive pulmonary disease) (HCC)   Prolonged QT interval   OSA (obstructive sleep apnea)   Morbid obesity with BMI of 50.0-59.9, adult ([place])   Open wound of left foot   Chronic venous stasis   Leukocytosis   Thrombocytosis   Hyperglycemia due to diabetes mellitus (HCC)   Lactic acidosis   Hypoalbuminemia due to protein-calorie malnutrition (HCC)   Elevated liver enzymes   Mixed hyperlipidemia   GERD (gastroesophageal reflux disease)   Chronic diastolic CHF (congestive heart failure) ([place])   Diabetic foot infection ([place])   Uncontrolled type 2 diabetes  mellitus with hyperglycemia, with long-term current use of insulin (HCC)   Pressure injury of skin   Iron deficiency anemia   Discharge Instructions: Discharge Instructions     [facility] pharmacist to adjust dose for Vancomycin, Aminoglycosides and other anti-infective therapies as requested by physician.   Complete by: As directed    [facility] to provide Cath Flo 84m   Complete by: As directed    Administer for PICC line occlusion and as ordered by physician for other access device issues.   Ambulatory referral to Podiatry   Complete by: As directed    Ambulatory referral to Sleep Studies   Complete by: As directed    Anaphylaxis Kit: Provided to treat any anaphylactic reaction to the medication being provided to the patient if First Dose or when requested by physician   Complete by: As directed    Epinephrine 1526mml vial / amp: Administer 0.26m65m0.26ml34mubcutaneously once for moderate to severe anaphylaxis, nurse  to call physician and pharmacy when reaction occurs and call 911 if needed for immediate care   Diphenhydramine 45m/ml IV vial: Administer 25-521mIV/IM PRN for first dose reaction, rash, itching, mild reaction, nurse to call physician and pharmacy when reaction occurs   Sodium Chloride 0.9% NS 50012mV: Administer if needed for hypovolemic blood pressure drop or as ordered by physician after call to physician with anaphylactic reaction   Change dressing on IV access line weekly and PRN   Complete by: As directed    Flush IV access with Sodium Chloride 0.9% and Heparin 10 units/ml or 100 units/ml   Complete by: As directed    Home infusion instructions - [facility]   Complete by: As directed    Instructions: Flush IV access with Sodium Chloride 0.9% and Heparin 10units/ml or 100units/ml   Change dressing on IV access line: Weekly and PRN   Instructions Cath Flo 2mg13mdminister for PICC Line occlusion and as ordered by physician for other  access device   [facility] pharmacist to adjust dose for: Vancomycin, Aminoglycosides and other anti-infective therapies as requested by physician   Method of administration may be changed at the discretion of home infusion pharmacist based upon assessment of the patient and/or caregivers ability to self-administer the medication ordered   Complete by: As directed       Allergies as of 11/27/2021       Reactions   Carvedilol Palpitations   Benicar [olmesartan] Swelling   Codeine Other (See Comments)   Confusion    Sulfa Antibiotics Swelling   Whole face swells   Trulicity [dulaglutide] Diarrhea        Medication List     STOP taking these medications    famotidine 20 MG tablet Commonly known as: PEPCID   insulin aspart protamine- aspart (70-30) 100 UNIT/ML injection Commonly known as: NOVOLOG MIX 70/30   promethazine 25 MG tablet Commonly known as: PHENERGAN       TAKE these medications    albuterol 108 (90 Base) MCG/ACT inhaler Commonly known as: VENTOLIN HFA Inhale 2 puffs into the lungs every 6 (six) hours as needed for wheezing or shortness of breath.   amLODipine 5 MG tablet Commonly known as: NORVASC Take 1 tablet (5 mg total) by mouth daily.   amoxicillin-clavulanate 875-125 MG tablet Commonly known as: Augmentin Take 1 tablet by mouth 2 (two) times daily for 14 days. Start taking on: December 21, 2021   atorvastatin 10 MG tablet Commonly known as: LIPITOR Take 10 mg by mouth at bedtime.   cefTRIAXone  IVPB Commonly known as: ROCEPHIN Inject 2 g into the vein daily for 24 days. Indication:  Diabetic foot infection/Osteomyelitis left foot First Dose: Yes Last Day of Therapy:  12/20/2021 Labs - Once weekly:  CBC/D and BMP, Labs - Every other week:  ESR and CRP Method of administration: IV Push Method of administration may be changed at the discretion of home infusion pharmacist based upon assessment of the patient and/or caregiver's  ability to self-administer the medication ordered.   doxycycline 100 MG capsule Commonly known as: VIBRAMYCIN Take 1 capsule (100 mg total) by mouth 2 (two) times daily for 14 days. Start taking on: December 21, 2021   esomeprazole 20 MG capsule Commonly known as: NEXIUM Take 20 mg by mouth 2 (two) times daily before a meal.   gabapentin 100 MG capsule Commonly known as: NEURONTIN Take 1 capsule (100 mg total) by mouth 3 (three) times daily.  What changed: how much to take   Gerhardt's butt cream Crea As directed   hydrALAZINE 25 MG tablet Commonly known as: APRESOLINE Take 1 tablet (25 mg total) by mouth every 8 (eight) hours.   hydrOXYzine 10 MG tablet Commonly known as: ATARAX Take 10 mg by mouth every 8 (eight) hours as needed for itching or anxiety.   insulin aspart 100 UNIT/ML injection Commonly known as: novoLOG Inject 12 Units into the skin 3 (three) times daily with meals. Give only if eats 50% or more of meal.   insulin glargine-yfgn 100 UNIT/ML injection Commonly known as: SEMGLEE Inject 0.35 mLs (35 Units total) into the skin at bedtime.   magnesium oxide 400 (240 Mg) MG tablet Commonly known as: MAG-OX Take 1 tablet by mouth daily. What changed: Another medication with the same name was removed. Continue taking this medication, and follow the directions you see here.   nystatin powder Commonly known as: MYCOSTATIN/NYSTOP Apply topically 2 (two) times daily.   potassium chloride SA 20 MEQ tablet Commonly known as: KLOR-CON M Take 20 mEq by mouth 2 (two) times daily.   propranolol ER 80 MG 24 hr capsule Commonly known as: INDERAL LA Take 80 mg by mouth daily.   saccharomyces boulardii 250 MG capsule Commonly known as: Florastor Take 1 capsule (250 mg total) by mouth 2 (two) times daily.   sertraline 50 MG tablet Commonly known as: ZOLOFT Take 3 tablets (150 mg total) by mouth daily. What changed:  medication strength how much to take Another  medication with the same name was removed. Continue taking this medication, and follow the directions you see here.   torsemide 20 MG tablet Commonly known as: DEMADEX Take 20 mg by mouth 2 (two) times daily.   vancomycin  IVPB Inject 1,000 mg into the vein daily for 25 days. Indication:  Diabetic foot infection/Osteomyelitis left foot First Dose: Yes Last Day of Therapy:  12/20/2021 Labs - _0 /22/22 1059            Contact information for follow-up providers     Montez Morita, Quillian Quince, MD. Schedule an appointment as soon as possible for a visit in 1 month(s).   Specialty: Gastroenterology Why: Hospital Follow Up Contact information: [address] [place] [address] [phone]              Contact information for after-discharge care     [facility] .   Service: Skilled Nursing Contact information: [place] [address] [phone]                    Allergies  Allergen Reactions   Carvedilol Palpitations   Benicar [Olmesartan] Swelling   Codeine Other (See Comments)    Confusion  Sulfa Antibiotics Swelling    Whole face swells   Trulicity [Dulaglutide] Diarrhea   Allergies as of 11/27/2021       Reactions   Carvedilol Palpitations   Benicar [olmesartan] Swelling   Codeine Other (See Comments)    Confusion    Sulfa Antibiotics Swelling   Whole face swells   Trulicity [dulaglutide] Diarrhea        Medication List     STOP taking these medications    famotidine 20 MG tablet Commonly known as: PEPCID   insulin aspart protamine- aspart (70-30) 100 UNIT/ML injection Commonly known as: NOVOLOG MIX 70/30   promethazine 25 MG tablet Commonly known as: PHENERGAN       TAKE these medications    albuterol 108 (90 Base) MCG/ACT inhaler Commonly known as: VENTOLIN HFA Inhale 2 puffs into the lungs every 6 (six) hours as needed for wheezing or shortness of breath.   amLODipine 5 MG tablet Commonly known as: NORVASC Take 1 tablet (5 mg total) by mouth daily.   amoxicillin-clavulanate 875-125 MG tablet Commonly known as: Augmentin Take 1 tablet by mouth 2 (two) times daily for 14 days. Start taking on: December 21, 2021   atorvastatin 10 MG tablet Commonly known as: LIPITOR Take 10 mg by mouth at bedtime.   cefTRIAXone  IVPB Commonly known as: ROCEPHIN Inject 2 g into the vein daily for 24 days. Indication:  Diabetic foot infection/Osteomyelitis left foot First Dose: Yes Last Day of Therapy:  12/20/2021 Labs - Once weekly:  CBC/D and BMP, Labs - Every other week:  ESR and CRP Method of administration: IV Push Method of administration may be changed at the discretion of home infusion pharmacist based upon assessment of the patient and/or caregiver's ability to self-administer the medication ordered.   doxycycline 100 MG capsule Commonly known as: VIBRAMYCIN Take 1 capsule (100 mg total) by mouth 2 (two) times daily for 14 days. Start taking on: December 21, 2021   esomeprazole 20 MG capsule Commonly known as: NEXIUM Take 20 mg by mouth 2 (two) times daily before a meal.   gabapentin 100 MG capsule Commonly known as: NEURONTIN Take 1 capsule (100 mg total) by mouth 3 (three) times daily. What changed: how much to take   Gerhardt's butt cream Crea As directed    hydrALAZINE 25 MG tablet Commonly known as: APRESOLINE Take 1 tablet (25 mg total) by mouth every 8 (eight) hours.   hydrOXYzine 10 MG tablet Commonly known as: ATARAX Take 10 mg by mouth every 8 (eight) hours as needed for itching or anxiety.   insulin aspart 100 UNIT/ML injection Commonly known as: novoLOG Inject 12 Units into the skin 3 (three) times daily with meals. Give only if eats 50% or more of meal.   insulin glargine-yfgn 100 UNIT/ML injection Commonly known as: SEMGLEE Inject 0.35 mLs (35 Units total) into the skin at bedtime.   magnesium oxide 400 (240 Mg) MG tablet Commonly known as: MAG-OX Take 1 tablet by mouth daily. What changed: Another medication with the same name was removed. Continue taking this medication, and follow the directions you see here.   nystatin powder Commonly known as: MYCOSTATIN/NYSTOP Apply topically 2 (two) times daily.   potassium chloride SA 20 MEQ tablet Commonly known as: KLOR-CON M Take 20 mEq by mouth 2 (two) times daily.   propranolol ER 80 MG 24 hr capsule Commonly known as: INDERAL LA Take 80 mg by mouth daily.   saccharomyces boulardii 250 MG capsule Commonly  known as: Florastor Take 1 capsule (250 mg total) by mouth 2 (two) times daily.   sertraline 50 MG tablet Commonly known as: ZOLOFT Take 3 tablets (150 mg total) by mouth daily. What changed:  medication strength how much to take Another medication with the same name was removed. Continue taking this medication, and follow the directions you see here.   torsemide 20 MG tablet Commonly known as: DEMADEX Take 20 mg by mouth 2 (two) times daily.   vancomycin  IVPB Inject 1,000 mg into the vein daily for 25 days. Indication:  Diabetic foot infection/Osteomyelitis left foot First Dose: Yes Last Day of Therapy:  12/20/2021 Labs - _0 /22/22 1059            Procedures/Studies: CT ABDOMEN PELVIS WO CONTRAST  Result Date: 11/21/2021 CLINICAL DATA:  Left lower quadrant abdominal pain. EXAM: CT ABDOMEN AND PELVIS WITHOUT CONTRAST TECHNIQUE: Multidetector CT imaging of the abdomen and pelvis was performed following the standard protocol without IV contrast. COMPARISON:  CT abdomen and pelvis 09/24/2017. FINDINGS: Lower chest: No acute abnormality. Hepatobiliary: No focal liver abnormality is seen. Status post cholecystectomy. Pancreas: Atrophic. Spleen: Mildly enlarged, unchanged. Adrenals/Urinary Tract: There is a 16 mm cyst in the right kidney. Otherwise, the adrenal glands, kidneys and bladder are within normal limits. Stomach/Bowel: Stomach is within normal limits. There is distal esophageal wall thickening. Appendix appears normal. No evidence of bowel wall thickening, distention, or inflammatory changes. Vascular/Lymphatic: Aortic atherosclerosis. No enlarged abdominal or pelvic lymph nodes. Reproductive: Uterus and bilateral adnexa are unremarkable. Other: There is no ascites or focal abdominal wall hernia. There is mild body wall edema. There is skin thickening measuring up to 16 mm in the left anterior abdominal wall. No focal fluid collection or soft tissue gas. Musculoskeletal: Multilevel degenerative changes affect the spine IMPRESSION: 1. Skin thickening in the left anterior abdominal wall. Correlate clinically for infection. No evidence for abscess or soft tissue gas. 2. Mild splenomegaly. 3. Wall  thickening of the distal esophagus concerning for esophagitis. Recommend clinical correlation follow-up to exclude underlying lesion. 4.  Aortic Atherosclerosis (ICD10-I70.0). Electronically Signed   By: Ronney Asters M.D.   On: 11/21/2021 21:18   DG Tibia/Fibula Right  Result Date: 11/06/2021 CLINICAL DATA:  Fall, ulcers EXAM: RIGHT TIBIA AND FIBULA - 2 VIEW; RIGHT FOOT - 2 VIEW; LEFT FOOT - 2 VIEW COMPARISON:  None. FINDINGS: Right tibia, fibula and right foot: No evidence of fracture or dislocation. Vascular calcifications. Soft tissue swelling seen about the right first MTP joint. No osseous destruction or periosteal reaction. No significant degenerative changes. Left foot: No evidence fracture or dislocation. Vascular calcifications. Ulcer the superficial soft tissues overlying the first MTP joint. No osseous destruction or periosteal reaction. No significant degenerative changes. IMPRESSION: No evidence of fracture or osteomyelitis. Electronically Signed   By: Denny Peon  Strickland M.D.   On: 11/06/2021 15:32   CT HEAD WO CONTRAST (5MM)  Result Date: 11/26/2021 CLINICAL DATA:  Mental status change, unknown cause EXAM: CT HEAD WITHOUT CONTRAST TECHNIQUE: Contiguous axial images were obtained from the base of the skull through the vertex without intravenous contrast. COMPARISON:  12/26/2019 FINDINGS: Brain: There is no acute intracranial hemorrhage, mass effect, or edema. Gray-white differentiation is preserved. There is no extra-axial fluid collection. Prominence of the ventricles and sulci reflects stable parenchymal volume loss. Patchy hypoattenuation in the supratentorial white matter is nonspecific but probably reflects stable chronic microvascular ischemic changes. Vascular: There is atherosclerotic calcification at the skull base. Skull: Calvarium is unremarkable. Sinuses/Orbits: Paranasal sinus mucosal thickening. Lens replacements. Other: Mild left scalp soft tissue swelling. IMPRESSION: No acute  intracranial abnormality. Stable chronic findings detailed above. Electronically Signed   By: Macy Mis M.D.   On: 11/26/2021 15:17   CT FOOT LEFT W CONTRAST  Result Date: 11/23/2021 CLINICAL DATA:  Foot swelling, diabetic osteomyelitis suspected. EXAM: CT OF THE LOWER LEFT EXTREMITY WITH CONTRAST TECHNIQUE: Multidetector CT imaging of the lower left extremity was performed according to the standard protocol following intravenous contrast administration. CONTRAST:  23m OMNIPAQUE IOHEXOL 300 MG/ML  SOLN COMPARISON:  None. FINDINGS: Bones/Joint/Cartilage No cortical erosion or periosteal reaction. No evidence of fracture or dislocation. Degenerative changes at the talonavicular and calcaneonavicular joint space subchondral cystic changes and minimal osteophytes. Subtalar and tibiotalar joints are intact. Ligaments Suboptimally assessed by CT. Muscles and Tendons Muscles are normal in bulk. No evidence of intramuscular fluid collection or abscess. Soft tissues There is marked skin thickening and subcutaneous soft tissue edema about the distal aspect of the foot. There is a deep skin wound about the plantar aspect of the first metatarsophalangeal joint without evidence of drainable fluid collection or abscess. IMPRESSION: 1. Skin thickening and subcutaneous soft tissue edema about the forefoot consistent with cellulitis. 2. Deep skin wound about the plantar aspect of the first metatarsophalangeal joint. No cortical erosion or periosteal reaction concerning for osteomyelitis. Evaluation of early osteomyelitis is however limited on CT scans. If there is persistent clinical concern MR examination could be obtained for further evaluation. Electronically Signed   By: IKeane PoliceD.O.   On: 11/23/2021 16:30   CT TIBIA FIBULA RIGHT W CONTRAST  Result Date: 11/23/2021 CLINICAL DATA:  Leg swelling and sores. EXAM: CT OF THE LOWER RIGHT EXTREMITY WITH CONTRAST TECHNIQUE: Multidetector CT imaging of the lower  right extremity was performed according to the standard protocol following intravenous contrast administration. CONTRAST:  771mOMNIPAQUE IOHEXOL 300 MG/ML  SOLN COMPARISON:  75 cc Omnipaque 300 FINDINGS: Bones/Joint/Cartilage Knee osteoarthritis especially affecting the patellofemoral compartment. No bony destructive findings in the tibial or fibular characteristic of osteomyelitis. Small knee effusion without a substantial degree of synovitis. Posterior meniscal chondrocalcinosis versus free osteochondral fragment along the posterior horn medial meniscus. Plantar and Achilles calcaneal spurs. Accessory navicular noted. Small Baker's cyst noted. Ligaments Suboptimally assessed by CT. Muscles and Tendons No intramuscular abscess or hematoma. Soft tissues Subcutaneous edema at the level of the knee is primarily anterior and lateral. This tracks distally in the calf and is more confluent in the distal calf and in the ankle region where the appearance is more circumferential. The cause of this subcutaneous edema is nonspecific. No subcutaneous abscess is present. There are scattered irregular subcutaneous calcifications compatible with venous insufficiency. Atherosclerotic calcification of arterial vascular structures also noted. Today's exam is not a CT angiogram and does not  adequately assess patency of the arterial vessels distal to the popliteal artery. I do not see a definite large cutaneous ulceration or gas tracking in the subcutaneous tissues. IMPRESSION: 1. No findings of osteomyelitis or drainable abscess. 2. Subcutaneous edema especially in the distal calf and ankle region. Cellulitis not excluded. No gas in the soft tissues. 3. Subcutaneous calcifications along the calf compatible with chronic venous insufficiency. 4. Small knee effusion and small Baker's cyst. No substantial synovitis. 5. Posterior horn medial meniscus chondrocalcinosis versus small adjacent free osteochondral fragment. 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 6. Plantar and Achilles calcaneal spurs. Electronically Signed   By: Van Clines M.D.   On: 11/23/2021 16:54   MR FOOT RIGHT WO CONTRAST  Result Date: 11/23/2021 CLINICAL DATA:  Foot swelling, diabetic, osteomyelitis suspected. No prior imaging. EXAM: MRI OF THE RIGHT FOREFOOT WITHOUT CONTRAST TECHNIQUE: Multiplanar, multisequence MR imaging of the right was performed. No intravenous contrast was administered. COMPARISON:  Radiographs dated November 21, 2021. FINDINGS: Bones/Joint/Cartilage No fracture or dislocation. Normal alignment. No joint effusion. No marrow signal abnormality. Ligaments Collateral ligaments are intact.  Lisfranc ligament is intact. Muscles and Tendons Flexor, peroneal and extensor compartment tendons are intact. 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 No drainable fluid collection or abscess. Soft tissue Skin thickening and prominent subcutaneous soft tissue edema about the dorsal and plantar aspect of the forefoot. No drainable fluid collection or abscess. No soft tissue mass. IMPRESSION: 1.  No MR evidence of acute osteomyelitis. 2. Skin thickening and subcutaneous soft tissue edema consistent with cellulitis. No drainable fluid collection or abscess. Electronically Signed   By: Keane Police D.O.   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 EXAM: MRI OF THE LEFT FOOT WITHOUT CONTRAST TECHNIQUE: Multiplanar, multisequence MR imaging of the left was performed. No intravenous contrast was administered. COMPARISON:  Radiographs dated November 21, 2021 FINDINGS: Nondiagnostic examination due to significant motion. Subcutaneous soft tissue edema. IMPRESSION: Nondiagnostic examination due to motion. Subcutaneous soft tissue edema. Electronically Signed   By: Keane Police D.O.    On: 11/23/2021 11:37   US Abdomen Limited  Result Date: 11/22/2021 CLINICAL DATA:  Elevated liver enzymes. Gallbladder surgically absent. EXAM: ULTRASOUND ABDOMEN LIMITED RIGHT UPPER QUADRANT COMPARISON:  CT abdomen and pelvis, 11/21/2021. FINDINGS: Gallbladder: Surgically absent. Common bile duct: Diameter: 4 mm Liver: Suboptimally visualized. Normal overall size. No convincing mass. Portal vein is patent on color Doppler imaging with normal direction of blood flow towards the liver. Other: None. IMPRESSION: 1. Significantly limited study due to the patient's body habitus. 2. No acute findings. No bile duct dilation. Liver not well assessed, better visualized on the previous day's CT. Electronically Signed   By: Lajean Manes M.D.   On: 11/22/2021 09:01   US Venous Img Lower Bilateral (DVT)  Result Date: 11/23/2021 CLINICAL DATA:  Bilateral lower extremity chronic pain and edema EXAM: BILATERAL LOWER EXTREMITY VENOUS DOPPLER ULTRASOUND TECHNIQUE: Gray-scale sonography with graded compression, as well as color Doppler and duplex ultrasound were performed to evaluate the lower extremity deep venous systems from the level of the common femoral vein and including the common femoral, femoral, profunda femoral, popliteal and calf veins including the posterior tibial, peroneal and gastrocnemius veins when visible. The superficial great saphenous vein was also interrogated. Spectral Doppler was utilized to evaluate flow at rest and with distal augmentation maneuvers in  the common femoral, femoral and popliteal veins. COMPARISON:  None. FINDINGS: RIGHT LOWER EXTREMITY Common Femoral Vein: No evidence of thrombus. Normal compressibility, respiratory phasicity and response to augmentation. Saphenofemoral Junction: No evidence of thrombus. Normal compressibility and flow on color Doppler imaging. Profunda Femoral Vein: No evidence of thrombus. Normal compressibility and flow on color Doppler imaging. Femoral Vein: No  evidence of thrombus. Normal compressibility, respiratory phasicity and response to augmentation. Popliteal Vein: No evidence of thrombus. Normal compressibility, respiratory phasicity and response to augmentation. Calf Veins: No evidence of thrombus. Normal compressibility and flow on color Doppler imaging. Other Findings: Right popliteal fossa minimally complex thick-walled Baker's cyst measures 4.5 x 1.4 x 2.9 cm LEFT LOWER EXTREMITY Common Femoral Vein: No evidence of thrombus. Normal compressibility, respiratory phasicity and response to augmentation. Saphenofemoral Junction: No evidence of thrombus. Normal compressibility and flow on color Doppler imaging. Profunda Femoral Vein: No evidence of thrombus. Normal compressibility and flow on color Doppler imaging. Femoral Vein: No evidence of thrombus. Normal compressibility, respiratory phasicity and response to augmentation. Popliteal Vein: No evidence of thrombus. Normal compressibility, respiratory phasicity and response to augmentation. Calf Veins: No evidence of thrombus. Normal compressibility and flow on color Doppler imaging. IMPRESSION: No significant DVT demonstrated in either extremity. Limited exam because of obesity. 4.5 cm right knee Baker's cyst. Electronically Signed   By: Jerilynn Mages.  Shick M.D.   On: 11/23/2021 14:10   US ARTERIAL ABI (SCREENING LOWER EXTREMITY)  Result Date: 11/23/2021 CLINICAL DATA:  60 year old female with a history bilateral wounds EXAM: NONINVASIVE PHYSIOLOGIC VASCULAR STUDY OF BILATERAL LOWER EXTREMITIES TECHNIQUE: Evaluation of both lower extremities was performed at rest, including calculation of ankle-brachial indices, multiple segmental pressure evaluation, segmental Doppler and segmental pulse volume recording. COMPARISON:  None. FINDINGS: Right ABI:  0.96 Left ABI:  1.35 Right Lower Extremity: Segmental Doppler at the right ankle demonstrates monophasic waveforms Left Lower Extremity: Segmental Doppler at the left ankle  demonstrates monophasic waveforms IMPRESSION: Resting ABI the bilateral lower extremity within normal limits, though could be falsely elevated given the segmental exam. Segmental Doppler at the bilateral ankles demonstrates monophasic waveforms. Signed, Dulcy Fanny. Dellia Nims, RPVI [facility] Electronically Signed   By: Corrie Mckusick D.O.   On: 11/23/2021 11:21   DG CHEST PORT 1 VIEW  Result Date: 11/26/2021 CLINICAL DATA:  Hypoxemia with weakness and shortness of breath. EXAM: PORTABLE CHEST 1 VIEW COMPARISON:  Radiographs 12/26/2019 and 09/25/2019.  CT 09/02/2008. FINDINGS: 1408 hours. The heart size and mediastinal contours are stable allowing for portable semi erect technique and lordotic positioning. The lungs appear clear. There is no pleural effusion or pneumothorax. A left-sided PICC projects to the level of the azygous vein. No osseous abnormalities are identified. IMPRESSION: Stable mild cardiomegaly. No acute cardiopulmonary process identified. Electronically Signed   By: Richardean Sale M.D.   On: 11/26/2021 14:26   DG Foot 2 Views Left  Result Date: 11/06/2021 CLINICAL DATA:  Fall, ulcers EXAM: RIGHT TIBIA AND FIBULA - 2 VIEW; RIGHT FOOT - 2 VIEW; LEFT FOOT - 2 VIEW COMPARISON:  None. FINDINGS: Right tibia, fibula and right foot: No evidence of fracture or dislocation. Vascular calcifications. Soft tissue swelling seen about the right first MTP joint. No osseous destruction or periosteal reaction. No significant degenerative changes. Left foot: No evidence fracture or dislocation. Vascular calcifications. Ulcer the superficial soft tissues overlying the first MTP joint. No osseous destruction or periosteal reaction. No significant degenerative changes. IMPRESSION: No evidence of fracture  or osteomyelitis. Electronically Signed   By: Yetta Glassman M.D.   On: 11/06/2021 15:32   DG Foot 2 Views Right  Result Date: 11/06/2021 CLINICAL  DATA:  Fall, ulcers EXAM: RIGHT TIBIA AND FIBULA - 2 VIEW; RIGHT FOOT - 2 VIEW; LEFT FOOT - 2 VIEW COMPARISON:  None. FINDINGS: Right tibia, fibula and right foot: No evidence of fracture or dislocation. Vascular calcifications. Soft tissue swelling seen about the right first MTP joint. No osseous destruction or periosteal reaction. No significant degenerative changes. Left foot: No evidence fracture or dislocation. Vascular calcifications. Ulcer the superficial soft tissues overlying the first MTP joint. No osseous destruction or periosteal reaction. No significant degenerative changes. IMPRESSION: No evidence of fracture or osteomyelitis. Electronically Signed   By: Yetta Glassman M.D.   On: 11/06/2021 15:32   DG Foot Complete Left  Result Date: 11/21/2021 CLINICAL DATA:  Wounds to both feet x2 weeks. EXAM: LEFT FOOT - COMPLETE 3+ VIEW COMPARISON:  November 06, 2021 FINDINGS: There is no evidence of fracture or dislocation. There is no evidence of arthropathy or other focal bone abnormality. A 1.2 cm superficial soft tissue ulceration is seen along the plantar aspect of the distal left foot. Moderate severity soft tissue swelling is also seen within this region. IMPRESSION: Plantar soft tissue ulceration and associated soft tissue swelling, as described above, without an acute osseous abnormality. Electronically Signed   By: Virgina Norfolk M.D.   On: 11/21/2021 21:33   DG Foot Complete Right  Result Date: 11/21/2021 CLINICAL DATA:  Wounds to both feet x2 weeks. EXAM: RIGHT FOOT COMPLETE - 3+ VIEW COMPARISON:  November 06, 2021 FINDINGS: There is no evidence of fracture or dislocation. There is no evidence of arthropathy or other focal bone abnormality. Mild to moderate severity vascular calcification is seen. Mild soft tissue swelling is seen along the plantar aspect of the distal right foot. IMPRESSION: Plantar soft tissue swelling, as described above, without an acute osseous abnormality.  Electronically Signed   By: Virgina Norfolk M.D.   On: 11/21/2021 21:31   IR PICC PLACEMENT LEFT >5 YRS INC IMG GUIDE  Result Date: 11/25/2021 INDICATION: Patient with history of osteomyelitis left foot, cellulitis requiring long-term IV antibiotic therapy. Request to IR for PICC placement for durable venous access. EXAM: LEFT UPPER EXTREMITY PICC LINE PLACEMENT WITH ULTRASOUND AND FLUOROSCOPIC GUIDANCE MEDICATIONS: 3 mL 1% lidocaine ANESTHESIA/SEDATION: None. FLUOROSCOPY TIME:  Fluoroscopy Time: 0 minutes 24 seconds (16.5 mGy). COMPLICATIONS: None immediate. PROCEDURE: The patient was advised of the possible risks and complications and agreed to undergo the procedure. The patient was then brought to the angiographic suite for the procedure. The left arm was prepped with chlorhexidine, draped in the usual sterile fashion using maximum barrier technique (cap and mask, sterile gown, sterile gloves, large sterile sheet, hand hygiene and cutaneous antisepsis) and infiltrated locally with 1% Lidocaine. Ultrasound demonstrated patency of the left basilic vein, and this was documented with an image. Under real-time ultrasound guidance, this vein was accessed with a 21 gauge micropuncture needle and image documentation was performed. A 0.018 wire was introduced in to the vein. Over this, a 5 [place] single lumen power injectable PICC was advanced to the lower SVC/right atrial junction. Fluoroscopy during the procedure and fluoro spot radiograph confirms appropriate catheter position. The catheter was flushed, aspirated, and covered with a sterile dressing. Catheter length: 42 cm IMPRESSION: Successful left arm power PICC line placement with ultrasound and fluoroscopic guidance. The catheter is ready for use. Read  by Candiss Norse, PA-C Electronically Signed   By: Ruthann Cancer M.D.   On: 11/25/2021 13:54   [place] EKG SITE RITE  Result Date: 11/23/2021 If Site Rite image not attached, placement could not be  confirmed due to current cardiac rhythm.    Subjective: Pt without complaints today.  Pt is more alert and talkative and Pt agreeable to going to SNF today.    Discharge Exam: Vitals:   11/27/21 0601 11/27/21 0736  BP: (!) 159/92   Pulse:    Resp:    Temp:  97.8 F (36.6 C)  SpO2:     Vitals:   11/27/21 0304 11/27/21 0423 11/27/21 0601 11/27/21 0736  BP:   (!) 159/92   Pulse: 65     Resp: 20     Temp:  97.8 F (36.6 C)  97.8 F (36.6 C)  TempSrc:  Axillary  Oral  SpO2: 98%     Weight:      Height:       General:  awake, morbidly obese, cooperative, Pt is alert, follows commands appropriately, not in acute distress HEENT: No icterus, No thrush, No neck mass, [place]/AT Cardiovascular: RRR, S1/S2, no rubs, no gallops Respiratory: CTA bilaterally, no wheezing, no crackles, no rhonchi Abdomen: Soft/+BS, non tender, non distended, no guarding Extremities: left first plantar MTPJ area healing well after debridement;  erythema of right calf without necrosis or crepitance    The results of significant diagnostics from this hospitalization (including imaging, microbiology, ancillary and laboratory) are listed below for reference.     Microbiology: Recent Results (from the past 240 hour(s))  Blood culture (routine x 2)     Status: None   Collection Time: 11/21/21 10:45 PM   Specimen: BLOOD RIGHT HAND  Result Value Ref Range Status   Specimen Description   Final    BLOOD RIGHT HAND BOTTLES DRAWN AEROBIC AND ANAEROBIC   Special Requests Blood Culture adequate volume  Final   Culture   Final    NO GROWTH 5 DAYS Performed at [facility], [address] [place], [place] [address]    Report Status 11/26/2021 FINAL  Final  Blood culture (routine x 2)     Status: None   Collection Time: 11/21/21 10:54 PM   Specimen: BLOOD LEFT HAND  Result Value Ref Range Status   Specimen Description   Final    BLOOD LEFT HAND BOTTLES DRAWN AEROBIC AND ANAEROBIC   Special Requests Blood Culture  adequate volume  Final   Culture   Final    NO GROWTH 5 DAYS Performed at [facility], [address] [place], [place] [address]    Report Status 11/26/2021 FINAL  Final  Resp Panel by RT-PCR (Flu A&B, Covid) Nasopharyngeal Swab     Status: None   Collection Time: 11/21/21 11:09 PM   Specimen: Nasopharyngeal Swab; Nasopharyngeal(NP) swabs in vial transport medium  Result Value Ref Range Status   SARS Coronavirus 2 by RT PCR NEGATIVE NEGATIVE Final    Comment: (NOTE) SARS-CoV-2 target nucleic acids are NOT DETECTED.  The SARS-CoV-2 RNA is generally detectable in upper respiratory specimens during the acute phase of infection. The lowest concentration of SARS-CoV-2 viral copies this assay can detect is 138 copies/mL. A negative result does not preclude SARS-Cov-2 infection and should not be used as the sole basis for treatment or other patient management decisions. A negative result may occur with  improper specimen collection/handling, submission of specimen other than nasopharyngeal swab, presence of viral mutation(s)  within the areas targeted by this assay, and inadequate number of viral copies(<138 copies/mL). A negative result must be combined with clinical observations, patient history, and epidemiological information. The expected result is Negative.  Fact Sheet for Patients:  EntrepreneurPulse.com.au  Fact Sheet for Healthcare Providers:  IncredibleEmployment.be  This test is no t yet approved or cleared by the [place] FDA and  has been authorized for detection and/or diagnosis of SARS-CoV-2 by FDA under an Emergency Use Authorization (EUA). This EUA will remain  in effect (meaning this test can be used) for the duration of the COVID-19 declaration under Section 564(b)(1) of the Act, 21 U.S.C.section 360bbb-3(b)(1), unless the authorization is terminated  or revoked sooner.       Influenza A by PCR NEGATIVE NEGATIVE Final    Influenza B by PCR NEGATIVE NEGATIVE Final    Comment: (NOTE) The Xpert Xpress SARS-CoV-2/FLU/RSV plus assay is intended as an aid in the diagnosis of influenza from Nasopharyngeal swab specimens and should not be used as a sole basis for treatment. Nasal washings and aspirates are unacceptable for Xpert Xpress SARS-CoV-2/FLU/RSV testing.  Fact Sheet for Patients: EntrepreneurPulse.com.au  Fact Sheet for Healthcare Providers: IncredibleEmployment.be  This test is not yet approved or cleared by the [place] FDA and has been authorized for detection and/or diagnosis of SARS-CoV-2 by FDA under an Emergency Use Authorization (EUA). This EUA will remain in effect (meaning this test can be used) for the duration of the COVID-19 declaration under Section 564(b)(1) of the Act, 21 U.S.C. section 360bbb-3(b)(1), unless the authorization is terminated or revoked.  Performed at [facility], [address] [place], [place] [address]      Labs: BNP (last 3 results) No results for input(s): BNP in the last 8760 hours. Basic Metabolic Panel: Recent Labs  Lab 11/21/21 1634 11/22/21 0449 11/23/21 0411 11/24/21 0527 11/25/21 0219 11/26/21 0518 11/27/21 0418  NA 141 140 138 142 138 140 141  K 4.0 3.9 3.4* 3.1* 3.5 3.7 3.7  CL 97* 100 100 104 103 102 100  CO2 32 _0 32  GLUCOSE 254* 293* 145* 124* 260* 118* 141*  BUN 18 22* 25* _1 CREATININE 1.27* 1.48* 1.54* 1.36* 1.20* 1.20* 1.38*  CALCIUM 9.0 8.4* 8.4* 8.6* 8.3* 8.7* 8.7*  MG 2.0 2.1  --  1.8  --  1.7 1.6*  PHOS  --  5.1*  --   --   --   --   --    Liver Function Tests: Recent Labs  Lab 11/22/21 0449 11/23/21 0411 11/24/21 0527 11/25/21 0219 11/27/21 0418  AST 286* 258* 280* 224* 123*  ALT 94* 106* 120* 115* 101*  ALKPHOS 115 113 116 144* 184*  BILITOT 0.9 0.5 0.6 0.4 0.6  PROT 6.3* 6.3* 6.4* 6.3* 6.2*  ALBUMIN 2.5* 2.4* 2.2* 2.2* 2.1*   Recent Labs  Lab  11/21/21 1634  LIPASE 18   Recent Labs  Lab 11/26/21 1236  AMMONIA 29   CBC: Recent Labs  Lab 11/21/21 1634 11/22/21 0449 11/23/21 0411 11/24/21 0527 11/25/21 0219 11/27/21 0418  WBC 18.8* 15.9* 12.5* 11.3* 10.9*  --   NEUTROABS 16.7*  --   --   --   --   --   HGB 14.5 12.5 11.8* 11.5* 11.0* 10.5*  HCT 45.3 39.1 36.2 36.2 34.1* 32.7*  MCV 80.9 82.1 80.4 81.2 82.6  --   PLT 458* 416* 396 371 330  --    Cardiac  Enzymes: No results for input(s): CKTOTAL, CKMB, CKMBINDEX, TROPONINI in the last 168 hours. BNP: Invalid input(s): POCBNP CBG: Recent Labs  Lab 11/26/21 1133 11/26/21 1612 11/26/21 2037 11/27/21 0334 11/27/21 0751  GLUCAP 109* 112* 123* 130* 120*   D-Dimer No results for input(s): DDIMER in the last 72 hours. Hgb A1c No results for input(s): HGBA1C in the last 72 hours. Lipid Profile No results for input(s): CHOL, HDL, LDLCALC, TRIG, CHOLHDL, LDLDIRECT in the last 72 hours. Thyroid function studies No results for input(s): TSH, T4TOTAL, T3FREE, THYROIDAB in the last 72 hours.  Invalid input(s): FREET3 Anemia work up Recent Labs    11/25/21 1502  FERRITIN 75  TIBC 183*  IRON 15*   Urinalysis    Component Value Date/Time   COLORURINE AMBER (A) 11/21/2021 1626   APPEARANCEUR CLOUDY (A) 11/21/2021 1626   LABSPEC 1.016 11/21/2021 1626   PHURINE 5.0 11/21/2021 1626   GLUCOSEU 50 (A) 11/21/2021 1626   HGBUR LARGE (A) 11/21/2021 1626   BILIRUBINUR NEGATIVE 11/21/2021 1626   KETONESUR 5 (A) 11/21/2021 1626   PROTEINUR >=300 (A) 11/21/2021 1626   UROBILINOGEN 0.2 05/15/2015 0112   NITRITE NEGATIVE 11/21/2021 1626   LEUKOCYTESUR NEGATIVE 11/21/2021 1626   Sepsis Labs Invalid input(s): PROCALCITONIN,  WBC,  LACTICIDVEN Microbiology Recent Results (from the past 240 hour(s))  Blood culture (routine x 2)     Status: None   Collection Time: 11/21/21 10:45 PM   Specimen: BLOOD RIGHT HAND  Result Value Ref Range Status   Specimen Description    Final    BLOOD RIGHT HAND BOTTLES DRAWN AEROBIC AND ANAEROBIC   Special Requests Blood Culture adequate volume  Final   Culture   Final    NO GROWTH 5 DAYS Performed at [facility], [address] [place], [place] [address]    Report Status 11/26/2021 FINAL  Final  Blood culture (routine x 2)     Status: None   Collection Time: 11/21/21 10:54 PM   Specimen: BLOOD LEFT HAND  Result Value Ref Range Status   Specimen Description   Final    BLOOD LEFT HAND BOTTLES DRAWN AEROBIC AND ANAEROBIC   Special Requests Blood Culture adequate volume  Final   Culture   Final    NO GROWTH 5 DAYS Performed at [facility], [address] [place], [place] [address]    Report Status 11/26/2021 FINAL  Final  Resp Panel by RT-PCR (Flu A&B, Covid) Nasopharyngeal Swab     Status: None   Collection Time: 11/21/21 11:09 PM   Specimen: Nasopharyngeal Swab; Nasopharyngeal(NP) swabs in vial transport medium  Result Value Ref Range Status   SARS Coronavirus 2 by RT PCR NEGATIVE NEGATIVE Final    Comment: (NOTE) SARS-CoV-2 target nucleic acids are NOT DETECTED.  The SARS-CoV-2 RNA is generally detectable in upper respiratory specimens during the acute phase of infection. The lowest concentration of SARS-CoV-2 viral copies this assay can detect is 138 copies/mL. A negative result does not preclude SARS-Cov-2 infection and should not be used as the sole basis for treatment or other patient management decisions. A negative result may occur with  improper specimen collection/handling, submission of specimen other than nasopharyngeal swab, presence of viral mutation(s) within the areas targeted by this assay, and inadequate number of viral copies(<138 copies/mL). A negative result must be combined with clinical observations, patient history, and epidemiological information. The expected result is Negative.  Fact Sheet for Patients:  EntrepreneurPulse.com.au  Fact Sheet for Healthcare  Providers:  IncredibleEmployment.be  This test is no t yet approved or cleared by the [place] and  has been authorized for detection and/or diagnosis of SARS-CoV-2 by FDA under an Emergency Use Authorization (EUA). This EUA will remain  in effect (meaning this test can be used) for the duration of the COVID-19 declaration under Section 564(b)(1) of the Act, 21 U.S.C.section 360bbb-3(b)(1), unless the authorization is terminated  or revoked sooner.       Influenza A by PCR NEGATIVE NEGATIVE Final   Influenza B by PCR NEGATIVE NEGATIVE Final    Comment: (NOTE) The Xpert Xpress SARS-CoV-2/FLU/RSV plus assay is intended as an aid in the diagnosis of influenza from Nasopharyngeal swab specimens and should not be used as a sole basis for treatment. Nasal washings and aspirates are unacceptable for Xpert Xpress SARS-CoV-2/FLU/RSV testing.  Fact Sheet for Patients: EntrepreneurPulse.com.au  Fact Sheet for Healthcare Providers: IncredibleEmployment.be  This test is not yet approved or cleared by the [place] FDA and has been authorized for detection and/or diagnosis of SARS-CoV-2 by FDA under an Emergency Use Authorization (EUA). This EUA will remain in effect (meaning this test can be used) for the duration of the COVID-19 declaration under Section 564(b)(1) of the Act, 21 U.S.C. section 360bbb-3(b)(1), unless the authorization is terminated or revoked.  Performed at [facility], [address], [place], [place] [address]    Time coordinating discharge: 43 mins   SIGNED:  Irwin Brakeman, MD  [facility] 11/27/2021, 11:17 AM How to contact the [facility] Attending or Consulting provider [place] or covering provider during after hours [place], for this patient?  Check the care team in [facility] and look for a) attending/consulting TRH provider listed and b) the [facility] team listed Log into [email or web address] and use [facility]'s universal password to access. If you do not have the password, please contact the hospital operator. Locate the [facility] provider you are looking for under [facility] and page to a number that you can be directly reached. If you still have difficulty reaching the provider, please page the [facility] (Director on Call) for the Hospitalists listed on amion for assistance.

## 2021-11-26 NOTE — Discharge Instructions (Addendum)
PATIENT NEEDS TO WEAR HER CPAP EVERY NIGHT   Skin Care Instructions:  Skin care to intertriginous areas of dermatitis: abdominal skin folds, inguinal areas, inframammary areas:  Cleanse with soap and water, rinse and pat dry. Insert house antimicrobial wicking textile [name] # [phone]) according to the instructions below:  Measure and cut length of InterDry to fit in skin folds that have skin breakdown  Tuck InterDry fabric into skin folds in a single layer, allow for 2 inches of overhang from skin edges to allow for wicking to occur  May remove to bathe; dry area thoroughly and then tuck into affected areas again  Do not apply any creams or ointments when using InterDry  DO NOT THROW AWAY FOR 5 DAYS unless soiled with stool  DO NOT [facility] product, this will inactivate the silver in the material  New sheet of Interdry should be applied after 5 days of use if patient continues to have skin breakdown   Wound Care Instructions:  Wound care to left lateral LE full thickness wounds:  Cleanse with soap and water, rinse with NS, pat dry. Cover lateral aspect of LE with full thickness wound with folded layers of xeroform gauze [name] # 294), top with ABD pad and secure with Kerlix roll gauze/paper tape.  Wound care to partial thickness wounds at right knee, left knee and left lateral/posterior LE:  cleanse with NS, and pat dry. Paint with betadine swabstick and allow to air-dry. No dressing. Perform twice daily.  Wound care to bilateral foot chronic ulcerations to 1st digits at plantar aspect for 11 days: Wash feet with soap and water, rinse and pat dry, particularly between digits. Cover the chronic calluses/ulcerations with folded pieces of xeroform gauze, top with dry gauze and secure with a few turns of Kerlix roll gauze/paper tape. Place feet into [employer].  Please continue nocturnal oxygen nasal cannula 3L/min.  Pt has been referred for outpatient sleep study.    Please check blood  sugars at least 4-5 times per day and adjust insulin therapy as needed for glycemic control.    Patient has been referred to a podiatrist.    Please continue IV antibiotics thru 12/20/21 then on 12/21/21 please start oral antibiotics doxycycline and augmentin x 2 weeks.

## 2021-11-26 NOTE — NC FL2 (Signed)
[place] MEDICAID FL2 LEVEL OF CARE SCREENING TOOL     IDENTIFICATION  Patient Name: [name] Birthdate: [birth year] Sex: female Admission Date (Current Location): 11/21/2021  [facility] [place] Number:  [employer] and Address:  [place] [address] [place]      Provider Number: [id number]  Attending Physician Name and Address:  Murlean Iba, MD  Relative Name and Phone Number:       Current Level of Care: Hospital Recommended Level of Care: [place] Prior Approval Number:    Date Approved/Denied:   PASRR Number:    Discharge Plan: SNF    Current Diagnoses: Patient Active Problem List   Diagnosis Date Noted   Iron deficiency anemia    Pressure injury of skin 11/24/2021   Open wound of left foot 11/22/2021   Chronic venous stasis 11/22/2021   Leukocytosis 11/22/2021   Thrombocytosis 11/22/2021   Hyperglycemia due to diabetes mellitus ([place]) 11/22/2021   Lactic acidosis 11/22/2021   Hypoalbuminemia due to protein-calorie malnutrition ([place]) 11/22/2021   Elevated liver enzymes 11/22/2021   Mixed hyperlipidemia 11/22/2021   GERD (gastroesophageal reflux disease) 11/22/2021   Chronic diastolic CHF (congestive heart failure) ([place]) 11/22/2021   Diabetic foot infection ([place]) 11/22/2021   Uncontrolled type 2 diabetes mellitus with hyperglycemia, with long-term current use of insulin ([place]) 11/22/2021   Lower extremity cellulitis 11/21/2021   Morbid obesity with BMI of 50.0-59.9, adult ([place]) 01/29/2020   Educated about COVID-19 virus infection 01/29/2020   OSA (obstructive sleep apnea) 12/21/2019   Asthma 09/98/3382   Diastolic dysfunction 50/53/9767   Prolonged QT interval 05/14/2015   Palpitations 07/23/2014   Generalized weakness 07/23/2014   Diabetes mellitus ([place]) 07/22/2014   Essential hypertension 07/22/2014   COPD (chronic obstructive pulmonary disease) ([place]) 07/22/2014   Depression  07/22/2014    Orientation RESPIRATION BLADDER Height & Weight     Self, Time, Situation, Place  O2 (see dc summary) Continent Weight: 293 lb 3.4 oz (133 kg) Height:  5\' 1"  (154.9 cm)  BEHAVIORAL SYMPTOMS/MOOD NEUROLOGICAL BOWEL NUTRITION STATUS      Continent Diet (see dc summary)  AMBULATORY STATUS COMMUNICATION OF NEEDS Skin   Extensive Assist Verbally PU Stage and Appropriate Care (Stage 3 Left Lower Leg, Stage 2 Left Knee, L Hip Unstageable, Non pressure wound R Toe and L Toe)   PU Stage 2 Dressing: Daily PU Stage 3 Dressing: Daily                 Personal Care Assistance Level of Assistance  Bathing, Feeding, Dressing Bathing Assistance: Maximum assistance Feeding assistance: Independent Dressing Assistance: Limited assistance     Functional Limitations Info  Sight, Hearing, Speech Sight Info: Adequate Hearing Info: Adequate Speech Info: Adequate    SPECIAL CARE FACTORS FREQUENCY  PT (By licensed PT), OT (By licensed OT)     PT Frequency: 5x week OT Frequency: 3x week            Contractures Contractures Info: Not present    Additional Factors Info  Code Status, Allergies, Psychotropic Code Status Info: Full Allergies Info: Carvedilol, Benicar, Codeine, Sulfa Antibiotics, Trulicity Psychotropic Info: Zoloft         Current Medications (11/26/2021):  This is the current hospital active medication list Current Facility-Administered Medications  Medication Dose Route Frequency Provider Last Rate Last Admin   albuterol (PROVENTIL) (2.5 MG/3ML) 0.083% nebulizer solution 3 mL  3 mL Inhalation Q6H PRN Adefeso, Oladapo, DO  amLODipine (NORVASC) tablet 5 mg  5 mg Oral Daily Tat, David, MD   5 mg at 11/26/21 0809   atorvastatin (LIPITOR) tablet 10 mg  10 mg Oral QHS Johnson, Clanford L, MD   10 mg at 11/25/21 2232   cefTRIAXone (ROCEPHIN) 2 g in sodium chloride 0.9 % 100 mL IVPB  2 g Intravenous Q24H Orson Eva, MD 200 mL/hr at 11/26/21 0816 2 g at 11/26/21  0816   Chlorhexidine Gluconate Cloth 2 % PADS 6 each  6 each Topical Daily Wynetta Emery, Clanford L, MD   6 each at 11/26/21 0811   enoxaparin (LOVENOX) injection 65 mg  65 mg Subcutaneous Q24H Tat, Shanon Brow, MD   65 mg at 11/26/21 0809   famotidine (PEPCID) tablet 20 mg  20 mg Oral BID PRN Adefeso, Oladapo, DO   20 mg at 11/24/21 2324   feeding supplement (GLUCERNA SHAKE) (GLUCERNA SHAKE) liquid 237 mL  237 mL Oral TID BM Adefeso, Oladapo, DO   237 mL at 11/25/21 2247   gabapentin (NEURONTIN) capsule 100 mg  100 mg Oral BID Adefeso, Oladapo, DO   100 mg at 11/26/21 0998   Gerhardt's butt cream   Topical TID Orson Eva, MD   Given at 11/26/21 [phone]   hydrALAZINE (APRESOLINE) injection 10 mg  10 mg Intravenous Q6H PRN Tat, Shanon Brow, MD       hydrALAZINE (APRESOLINE) tablet 25 mg  25 mg Oral Franco Collet, MD   25 mg at 11/26/21 0615   hydrOXYzine (ATARAX) tablet 10 mg  10 mg Oral Q8H PRN Johnson, Clanford L, MD       influenza vac split quadrivalent PF (FLUARIX) injection 0.5 mL  0.5 mL Intramuscular Tomorrow-1000 Adefeso, Oladapo, DO       insulin aspart (novoLOG) injection 0-20 Units  0-20 Units Subcutaneous TID WC Orson Eva, MD   15 Units at 11/25/21 1756   insulin aspart (novoLOG) injection 0-5 Units  0-5 Units Subcutaneous QHS Orson Eva, MD   2 Units at 11/25/21 2229   insulin aspart (novoLOG) injection 12 Units  12 Units Subcutaneous TID WC Johnson, Clanford L, MD       insulin glargine-yfgn (SEMGLEE) injection 40 Units  40 Units Subcutaneous QHS Johnson, Clanford L, MD   40 Units at 11/25/21 2230   lactated ringers infusion   Intravenous Continuous Tat, Shanon Brow, MD 125 mL/hr at 11/26/21 0406 New Bag at 11/26/21 0406   liver oil-zinc oxide (DESITIN) 40 % ointment   Topical TID Orson Eva, MD   Given at 11/26/21 0810   magnesium oxide (MAG-OX) tablet 400 mg  400 mg Oral Daily Johnson, Clanford L, MD   400 mg at 11/26/21 0810   nystatin (MYCOSTATIN/NYSTOP) topical powder   Topical BID Tat, Shanon Brow, MD    Given at 11/26/21 0812   pantoprazole (PROTONIX) EC tablet 40 mg  40 mg Oral Daily Adefeso, Oladapo, DO   40 mg at 11/26/21 0809   potassium chloride SA (KLOR-CON M) CR tablet 20 mEq  20 mEq Oral BID Wynetta Emery, Clanford L, MD   20 mEq at 11/26/21 0809   prochlorperazine (COMPAZINE) injection 5 mg  5 mg Intravenous Q6H PRN Adefeso, Oladapo, DO       propranolol ER (INDERAL LA) 24 hr capsule 80 mg  80 mg Oral Daily Johnson, Clanford L, MD   80 mg at 11/26/21 0811   sertraline (ZOLOFT) tablet 150 mg  150 mg Oral Daily Johnson, Clanford L, MD   150 mg at 11/26/21  1969   torsemide (DEMADEX) tablet 20 mg  20 mg Oral BID Wynetta Emery, Clanford L, MD   20 mg at 11/26/21 0809   vancomycin (VANCOCIN) IVPB 1000 mg/200 mL premix  1,000 mg Intravenous Q24H Erenest Blank, RPH 200 mL/hr at 11/25/21 2251 1,000 mg at 11/25/21 2251     Discharge Medications: Please see discharge summary for a list of discharge medications.  Relevant Imaging Results:  Relevant Lab Results:   Additional Information SSN: [id number], Pt will need IV antibiotics for osteomyelitis  Shade Flood, LCSW

## 2021-11-26 NOTE — Care Management Important Message (Signed)
Important Message  Patient Details  Name: [name] MRN: [id number] Date of Birth: 14-Nov-1961   Medicare Important Message Given:  Yes     Tommy Medal 11/26/2021, 11:37 AM

## 2021-11-26 NOTE — Telephone Encounter (Signed)
Patient needs hospital follow up in next 2-3 weeks. Consider labs next week, CBC, CMET.

## 2021-11-27 LAB — COMPREHENSIVE METABOLIC PANEL
ALT: 101 U/L — ABNORMAL HIGH (ref 0–44)
AST: 123 U/L — ABNORMAL HIGH (ref 15–41)
Albumin: 2.1 g/dL — ABNORMAL LOW (ref 3.5–5.0)
Alkaline Phosphatase: 184 U/L — ABNORMAL HIGH (ref 38–126)
Anion gap: 9 (ref 5–15)
BUN: 17 mg/dL (ref 6–20)
CO2: 32 mmol/L (ref 22–32)
Calcium: 8.7 mg/dL — ABNORMAL LOW (ref 8.9–10.3)
Chloride: 100 mmol/L (ref 98–111)
Creatinine, Ser: 1.38 mg/dL — ABNORMAL HIGH (ref 0.44–1.00)
GFR, Estimated: 44 mL/min — ABNORMAL LOW (ref >60)
Glucose, Bld: 141 mg/dL — ABNORMAL HIGH (ref 70–99)
Potassium: 3.7 mmol/L (ref 3.5–5.1)
Sodium: 141 mmol/L (ref 135–145)
Total Bilirubin: 0.6 mg/dL (ref 0.3–1.2)
Total Protein: 6.2 g/dL — ABNORMAL LOW (ref 6.5–8.1)

## 2021-11-27 LAB — HEMOGLOBIN AND HEMATOCRIT, BLOOD
HCT: 32.7 % — ABNORMAL LOW (ref 36.0–46.0)
Hemoglobin: 10.5 g/dL — ABNORMAL LOW (ref 12.0–15.0)

## 2021-11-27 LAB — GLUCOSE, CAPILLARY
Glucose-Capillary: 130 mg/dL — ABNORMAL HIGH (ref 70–99)
Glucose-Capillary: 120 mg/dL — ABNORMAL HIGH (ref 70–99)
Glucose-Capillary: 172 mg/dL — ABNORMAL HIGH (ref 70–99)

## 2021-11-27 LAB — MAGNESIUM: Magnesium: 1.6 mg/dL — ABNORMAL LOW (ref 1.7–2.4)

## 2021-11-27 MED ORDER — MAGNESIUM SULFATE 4 GM/100ML IV SOLN
4.0000 g | Freq: Once | INTRAVENOUS | Status: DC
  Filled 2021-11-27: qty 100

## 2021-11-27 NOTE — Progress Notes (Signed)
11/27/2021 11:22 AM  Pt re-examined today and she is alert and talkative and not having delirium at this time.  She is agreeable to using home CPAP at [facility] facility.  Her labs are stable. She can safely discharge to SNF today.  Updated DC summary.    Murvin Natal, MD How to contact the [facility] Attending or Consulting provider [place] or covering provider during after hours [place], for this patient?  Check the care team in [facility] and look for a) attending/consulting TRH provider listed and b) the [facility] team listed Log into [email or web address] and use [place] universal password to access. If you do not have the password, please contact the hospital operator. Locate the [facility] provider you are looking for under [facility] and page to a number that you can be directly reached. If you still have difficulty reaching the provider, please page the [facility] (Director on Call) for the Hospitalists listed on amion for assistance.

## 2021-11-27 NOTE — TOC Transition Note (Signed)
Transition of Care [facility]) - CM/SW Discharge Note   Patient Details  Name: [name] MRN: [id number] Date of Birth: [birth year]  Transition of Care [facility]) CM/SW Contact:  Shade Flood, LCSW Phone Number: 11/27/2021, 12:20 PM   Clinical Narrative:     Pt stable for dc today per MD. Updated Debbie at [facility] and they can accept. Updated dc clinical sent electronically. RN to call report. EMS arranged.  Updated pt's husband. There are no other TOC needs for dc.  Final next level of care: Skilled Nursing Facility Barriers to Discharge: Barriers Resolved   Patient Goals and CMS Choice Patient states their goals for this hospitalization and ongoing recovery are:: get better CMS Medicare.gov Compare Post Acute Care list provided to:: Patient Choice offered to / list presented to : Patient  Discharge Placement PASRR number recieved: 11/24/21            Patient chooses bed at: Other - please specify in the comment section below: ([facility]) Patient to be transferred to facility by: EMS Name of family member notified: Edsel Shira Patient and family notified of of transfer: 11/26/21  Discharge Plan and Services In-house Referral: Clinical Social Work   Post Acute Care Choice: [place]                               Social Determinants of Health (SDOH) Interventions     Readmission Risk Interventions Readmission Risk Prevention Plan 11/23/2021  Transportation Screening Complete  Medication Review (RN CM) Complete  Some recent data might be hidden

## 2021-11-27 NOTE — Progress Notes (Signed)
**Note De-Identified  Obfuscation** BIPAP removed from patient by RN; tolerating well.  RRT to continue to monitor.

## 2021-11-27 NOTE — Progress Notes (Signed)
Called SNF & report given to receiving nurse Elmyra Ricks) at [facility], this RN called patient's son Myrle Sheng) who reported that he & his father will arrive here around 3pm & requested that the patient not be transferred to SNF until they arrive, LCSW aware, patient ready for d/c at this time

## 2021-11-27 NOTE — Progress Notes (Signed)
Patient d/c to SNF with non-emergent EMS, PICC line left in place to LEFT arm as ordered for long term IV ABT use (flushed well, blood return noted, no pain reported, dressing dated), all belongings sent with patient along with d/c paperwork, left @ 1302

## 2021-12-01 NOTE — Telephone Encounter (Signed)
I called and left a message asked that patient please return call.  ?

## 2021-12-02 NOTE — Telephone Encounter (Signed)
Tried calling the alternate number, Call unable to go through.

## 2021-12-02 NOTE — Telephone Encounter (Signed)
I called and left another message asked that the patient please return call.

## 2021-12-03 ENCOUNTER — Encounter (INDEPENDENT_AMBULATORY_CARE_PROVIDER_SITE_OTHER): Payer: Self-pay

## 2021-12-03 NOTE — Telephone Encounter (Signed)
I mailed a letter to the patient letting her know that we have been calling and leaving voicemail on her phone's with no return call. I asked that she please call the office ASAP .

## 2021-12-03 NOTE — Telephone Encounter (Signed)
I have called the patient again today and left a vm asked that she please return the call to our office.

## 2021-12-09 NOTE — Telephone Encounter (Signed)
If she is not responding, we will see her in the office then. She should have also followed with her PCP.

## 2021-12-09 NOTE — Telephone Encounter (Signed)
Patient not responding to calls or letters. Please advise.

## 2021-12-15 ENCOUNTER — Inpatient Hospital Stay (HOSPITAL_COMMUNITY)
Admission: EM | Admit: 2021-12-15 | Discharge: 2021-12-19 | DRG: 871 | Disposition: A | Discharge status: Skilled Nursing Facility | Payer: Medicare HMO | Source: Skilled Nursing Facility | Attending: Internal Medicine | Admitting: Internal Medicine

## 2021-12-15 ENCOUNTER — Emergency Department (HOSPITAL_COMMUNITY): Payer: Medicare HMO

## 2021-12-15 ENCOUNTER — Encounter (HOSPITAL_COMMUNITY): Payer: Self-pay | Admitting: Emergency Medicine

## 2021-12-15 ENCOUNTER — Other Ambulatory Visit: Payer: Self-pay

## 2021-12-15 DIAGNOSIS — J1282 Pneumonia due to coronavirus disease 2019: Secondary | ICD-10-CM | POA: Diagnosis present

## 2021-12-15 DIAGNOSIS — E1169 Type 2 diabetes mellitus with other specified complication: Secondary | ICD-10-CM | POA: Diagnosis present

## 2021-12-15 DIAGNOSIS — F32A Depression, unspecified: Secondary | ICD-10-CM | POA: Diagnosis present

## 2021-12-15 DIAGNOSIS — Z66 Do not resuscitate: Secondary | ICD-10-CM | POA: Diagnosis present

## 2021-12-15 DIAGNOSIS — E876 Hypokalemia: Secondary | ICD-10-CM | POA: Diagnosis present

## 2021-12-15 DIAGNOSIS — Y95 Nosocomial condition: Secondary | ICD-10-CM | POA: Diagnosis present

## 2021-12-15 DIAGNOSIS — F05 Delirium due to known physiological condition: Secondary | ICD-10-CM | POA: Diagnosis present

## 2021-12-15 DIAGNOSIS — Z794 Long term (current) use of insulin: Secondary | ICD-10-CM

## 2021-12-15 DIAGNOSIS — Z79899 Other long term (current) drug therapy: Secondary | ICD-10-CM

## 2021-12-15 DIAGNOSIS — I5032 Chronic diastolic (congestive) heart failure: Secondary | ICD-10-CM | POA: Diagnosis present

## 2021-12-15 DIAGNOSIS — U071 COVID-19: Secondary | ICD-10-CM | POA: Diagnosis present

## 2021-12-15 DIAGNOSIS — Z7722 Contact with and (suspected) exposure to environmental tobacco smoke (acute) (chronic): Secondary | ICD-10-CM | POA: Diagnosis present

## 2021-12-15 DIAGNOSIS — N179 Acute kidney failure, unspecified: Secondary | ICD-10-CM | POA: Diagnosis present

## 2021-12-15 DIAGNOSIS — L89320 Pressure ulcer of left buttock, unstageable: Secondary | ICD-10-CM | POA: Diagnosis present

## 2021-12-15 DIAGNOSIS — S91302S Unspecified open wound, left foot, sequela: Secondary | ICD-10-CM

## 2021-12-15 DIAGNOSIS — Z888 Allergy status to other drugs, medicaments and biological substances status: Secondary | ICD-10-CM

## 2021-12-15 DIAGNOSIS — M868X7 Other osteomyelitis, ankle and foot: Secondary | ICD-10-CM | POA: Diagnosis present

## 2021-12-15 DIAGNOSIS — Z885 Allergy status to narcotic agent status: Secondary | ICD-10-CM

## 2021-12-15 DIAGNOSIS — Z882 Allergy status to sulfonamides status: Secondary | ICD-10-CM

## 2021-12-15 DIAGNOSIS — E1165 Type 2 diabetes mellitus with hyperglycemia: Secondary | ICD-10-CM | POA: Diagnosis present

## 2021-12-15 DIAGNOSIS — F419 Anxiety disorder, unspecified: Secondary | ICD-10-CM | POA: Diagnosis present

## 2021-12-15 DIAGNOSIS — L89893 Pressure ulcer of other site, stage 3: Secondary | ICD-10-CM | POA: Diagnosis present

## 2021-12-15 DIAGNOSIS — E162 Hypoglycemia, unspecified: Secondary | ICD-10-CM

## 2021-12-15 DIAGNOSIS — Z833 Family history of diabetes mellitus: Secondary | ICD-10-CM

## 2021-12-15 DIAGNOSIS — A4189 Other specified sepsis: Principal | ICD-10-CM | POA: Diagnosis present

## 2021-12-15 DIAGNOSIS — G9341 Metabolic encephalopathy: Secondary | ICD-10-CM | POA: Diagnosis present

## 2021-12-15 DIAGNOSIS — Z8249 Family history of ischemic heart disease and other diseases of the circulatory system: Secondary | ICD-10-CM

## 2021-12-15 DIAGNOSIS — Z6841 Body Mass Index (BMI) 40.0 and over, adult: Secondary | ICD-10-CM

## 2021-12-15 DIAGNOSIS — R0603 Acute respiratory distress: Secondary | ICD-10-CM

## 2021-12-15 DIAGNOSIS — I110 Hypertensive heart disease with heart failure: Secondary | ICD-10-CM | POA: Diagnosis present

## 2021-12-15 DIAGNOSIS — J440 Chronic obstructive pulmonary disease with acute lower respiratory infection: Secondary | ICD-10-CM | POA: Diagnosis present

## 2021-12-15 DIAGNOSIS — E8809 Other disorders of plasma-protein metabolism, not elsewhere classified: Secondary | ICD-10-CM | POA: Diagnosis present

## 2021-12-15 DIAGNOSIS — E11649 Type 2 diabetes mellitus with hypoglycemia without coma: Secondary | ICD-10-CM | POA: Diagnosis present

## 2021-12-15 DIAGNOSIS — R402421 Glasgow coma scale score 9-12, in the field [EMT or ambulance]: Secondary | ICD-10-CM

## 2021-12-15 DIAGNOSIS — J9601 Acute respiratory failure with hypoxia: Secondary | ICD-10-CM | POA: Diagnosis present

## 2021-12-15 DIAGNOSIS — E785 Hyperlipidemia, unspecified: Secondary | ICD-10-CM | POA: Diagnosis present

## 2021-12-15 DIAGNOSIS — I169 Hypertensive crisis, unspecified: Secondary | ICD-10-CM | POA: Diagnosis present

## 2021-12-15 DIAGNOSIS — K219 Gastro-esophageal reflux disease without esophagitis: Secondary | ICD-10-CM | POA: Diagnosis present

## 2021-12-15 LAB — CBG MONITORING, ED
Glucose-Capillary: 126 mg/dL — ABNORMAL HIGH (ref 70–99)
Glucose-Capillary: 227 mg/dL — ABNORMAL HIGH (ref 70–99)
Glucose-Capillary: <10 mg/dL — CL (ref 70–99)

## 2021-12-15 LAB — BLOOD GAS, ARTERIAL
Acid-Base Excess: 9.3 mmol/L — ABNORMAL HIGH (ref 0.0–2.0)
Bicarbonate: 31.9 mmol/L — ABNORMAL HIGH (ref 20.0–28.0)
Drawn by: 21310
FIO2: 100
O2 Saturation: 98.9 %
Patient temperature: 36.5
pCO2 arterial: 57.6 mmHg — ABNORMAL HIGH (ref 32.0–48.0)
pH, Arterial: 7.392 (ref 7.350–7.450)
pO2, Arterial: 212 mmHg — ABNORMAL HIGH (ref 83.0–108.0)

## 2021-12-15 LAB — CBC WITH DIFFERENTIAL/PLATELET
Abs Immature Granulocytes: 0.02 10*3/uL (ref 0.00–0.07)
Basophils Absolute: 0 10*3/uL (ref 0.0–0.1)
Basophils Relative: 1 %
Eosinophils Absolute: 0.4 10*3/uL (ref 0.0–0.5)
Eosinophils Relative: 7 %
HCT: 39.4 % (ref 36.0–46.0)
Hemoglobin: 12.5 g/dL (ref 12.0–15.0)
Immature Granulocytes: 0 %
Lymphocytes Relative: 9 %
Lymphs Abs: 0.5 10*3/uL — ABNORMAL LOW (ref 0.7–4.0)
MCH: 25.5 pg — ABNORMAL LOW (ref 26.0–34.0)
MCHC: 31.7 g/dL (ref 30.0–36.0)
MCV: 80.2 fL (ref 80.0–100.0)
Monocytes Absolute: 0.3 10*3/uL (ref 0.1–1.0)
Monocytes Relative: 5 %
Neutro Abs: 3.8 10*3/uL (ref 1.7–7.7)
Neutrophils Relative %: 78 %
Platelets: 263 10*3/uL (ref 150–400)
RBC: 4.91 MIL/uL (ref 3.87–5.11)
RDW: 15.9 % — ABNORMAL HIGH (ref 11.5–15.5)
WBC: 4.9 10*3/uL (ref 4.0–10.5)
nRBC: 0 % (ref 0.0–0.2)

## 2021-12-15 LAB — LACTIC ACID, PLASMA: Lactic Acid, Venous: 0.7 mmol/L (ref 0.5–1.9)

## 2021-12-15 LAB — PROTIME-INR
INR: 1 (ref 0.8–1.2)
Prothrombin Time: 12.9 seconds (ref 11.4–15.2)

## 2021-12-15 LAB — MAGNESIUM: Magnesium: 1.5 mg/dL — ABNORMAL LOW (ref 1.7–2.4)

## 2021-12-15 IMAGING — DX DG CHEST 1V PORT
1 series · 1 of 1 positions shown · non-contrast
Comparison: Chest x-ray [DATE], CT chest [DATE]

CLINICAL DATA: sob

EXAM:
PORTABLE CHEST 1 VIEW

[chest ap]
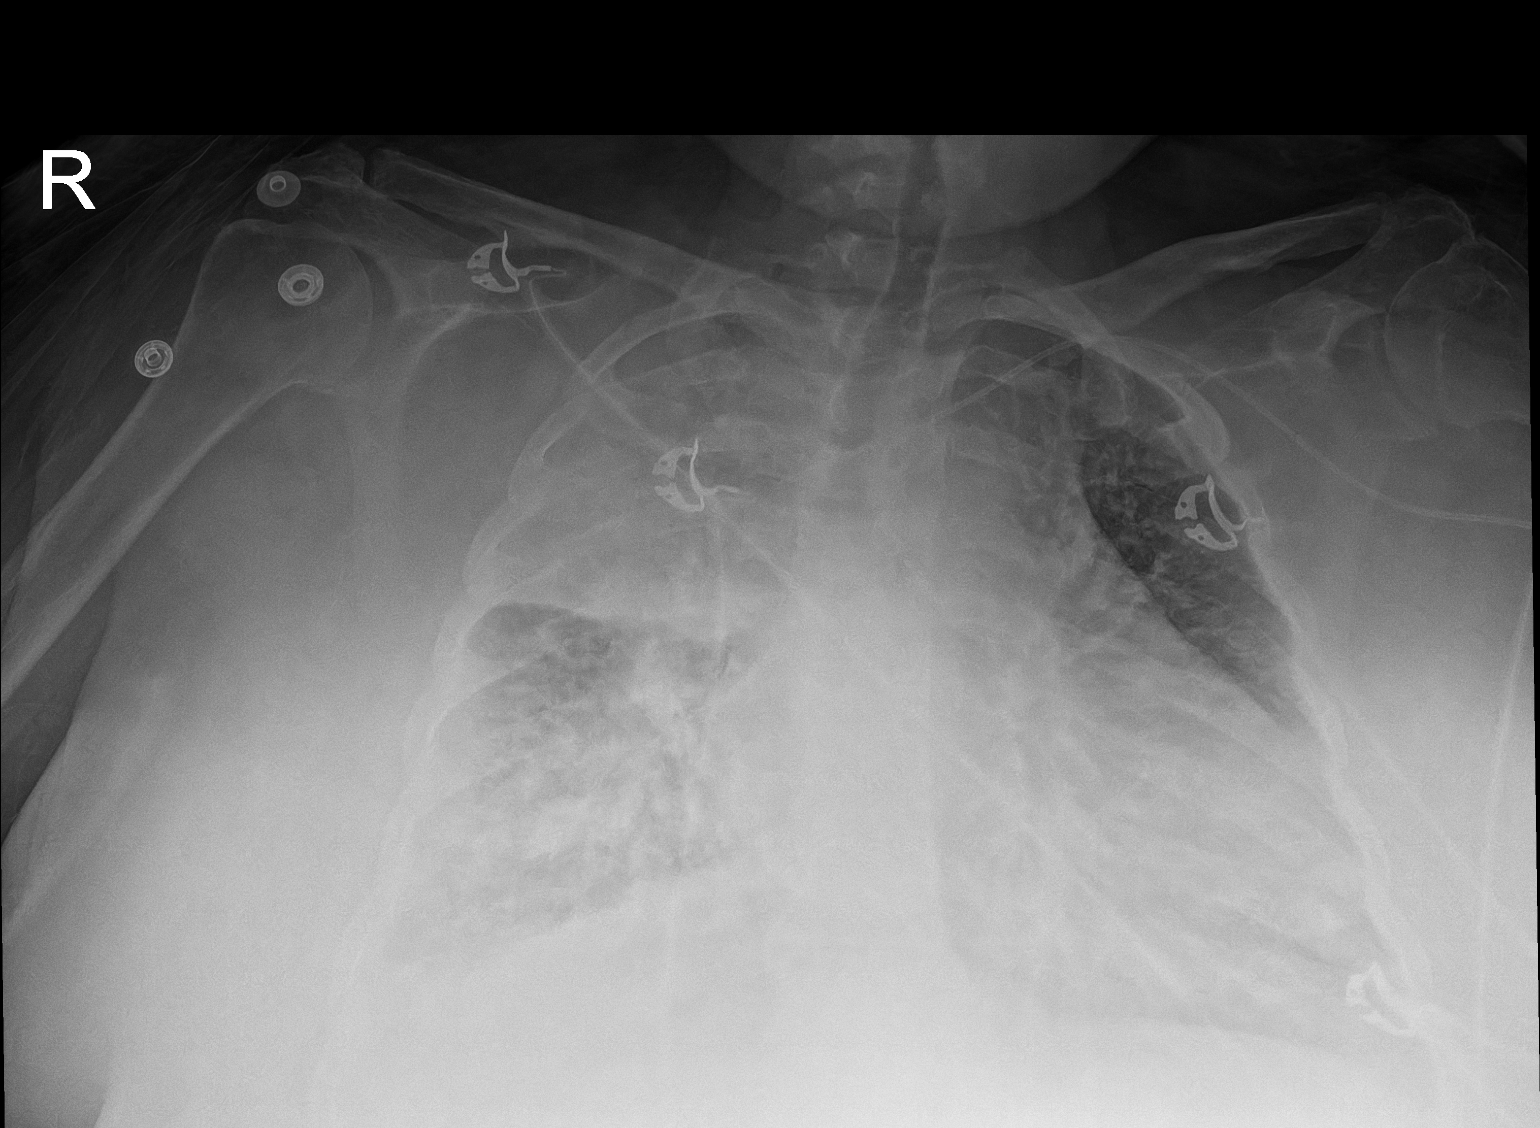

[1 of 1 positions shown; findings below may reference images not displayed]

FINDINGS: Enlarged cardiac silhouette. The heart and mediastinal contours are
unchanged.

Complete opacification of the right upper lobe. Patchy airspace
opacity within the right middle and lower lobes. No pulmonary edema.
Likely trace bilateral pleural effusions. No pneumothorax.

No acute osseous abnormality.
IMPRESSION: 1. Complete opacification of the right upper lobe. Patchy airspace
opacity within the right middle and lower lobes. Findings suggestive
of multifocal pneumonia. Followup PA and lateral chest X-ray is
recommended in 3-4 weeks following therapy to ensure resolution.
2. Likely trace bilateral pleural effusions.

## 2021-12-15 MED ORDER — DEXTROSE 50 % IV SOLN
INTRAVENOUS | Status: AC
  Administered 2021-12-15: 100 mL via INTRAVENOUS
  Filled 2021-12-15: qty 50

## 2021-12-15 MED ORDER — METHYLPREDNISOLONE SODIUM SUCC 125 MG IJ SOLR
125.0000 mg | Freq: Once | INTRAMUSCULAR | Status: AC
  Administered 2021-12-15: 125 mg via INTRAVENOUS
  Filled 2021-12-15: qty 2

## 2021-12-15 MED ORDER — ALBUTEROL SULFATE (2.5 MG/3ML) 0.083% IN NEBU
5.0000 mg | INHALATION_SOLUTION | Freq: Once | RESPIRATORY_TRACT | Status: AC
  Administered 2021-12-15: 5 mg via RESPIRATORY_TRACT
  Filled 2021-12-15: qty 6

## 2021-12-15 MED ORDER — VANCOMYCIN HCL 2000 MG/400ML IV SOLN
2000.0000 mg | Freq: Once | INTRAVENOUS | Status: AC
  Administered 2021-12-16: 2000 mg via INTRAVENOUS
  Filled 2021-12-15: qty 400

## 2021-12-15 MED ORDER — NITROGLYCERIN IN D5W 200-5 MCG/ML-% IV SOLN
50.0000 ug/min | INTRAVENOUS | Status: DC
  Administered 2021-12-15: 200 ug/min via INTRAVENOUS
  Administered 2021-12-16: 50 ug/min via INTRAVENOUS

## 2021-12-15 MED ORDER — NITROGLYCERIN IN D5W 200-5 MCG/ML-% IV SOLN
5.0000 ug/min | INTRAVENOUS | Status: DC
  Filled 2021-12-15: qty 250

## 2021-12-15 MED ORDER — SODIUM CHLORIDE 0.9 % IV SOLN
2.0000 g | Freq: Once | INTRAVENOUS | Status: AC
  Administered 2021-12-15: 2 g via INTRAVENOUS
  Filled 2021-12-15: qty 2

## 2021-12-15 MED ORDER — DEXTROSE 50 % IV SOLN
2.0000 | Freq: Once | INTRAVENOUS | Status: AC
Start: 2021-12-15 — End: 2021-12-15

## 2021-12-15 MED ORDER — ETOMIDATE 2 MG/ML IV SOLN
20.0000 mg | Freq: Once | INTRAVENOUS | Status: DC
  Filled 2021-12-15: qty 10

## 2021-12-15 MED ORDER — DEXTROSE 50 % IV SOLN
INTRAVENOUS | Status: AC
  Filled 2021-12-15: qty 100

## 2021-12-15 MED ORDER — ROCURONIUM BROMIDE 10 MG/ML (PF) SYRINGE
100.0000 mg | PREFILLED_SYRINGE | Freq: Once | INTRAVENOUS | Status: DC
Start: 2021-12-15 — End: 2021-12-19

## 2021-12-15 MED ORDER — ROCURONIUM BROMIDE 10 MG/ML (PF) SYRINGE
PREFILLED_SYRINGE | INTRAVENOUS | Status: AC
  Filled 2021-12-15: qty 10

## 2021-12-15 NOTE — ED Provider Notes (Signed)
[facility] Provider Note   CSN: [id number] Arrival date & time: 12/15/21  2218     History  Chief Complaint  Patient presents with   Respiratory Distress    [name] is a 61 y.o. female.  HPI Patient presents from nursing facility by EMS for respiratory distress.  History is provided by EMS: She was reportedly found in bed with difficulty breathing.  SPO2 was in the 60s on room air.  They placed her on 5 L of supplemental oxygen with subsequent SPO2 in the 80s.  She is typically not on supplemental oxygen.  She was placed on nonrebreather by EMS with improvement in oxygen 95%.  Patient has remained minimally responsive during transport to the hospital.  She has had intermittent twitching in her extremities.  She is in the nursing facility following a recent hospitalization.  EMS is uncertain of what that hospitalization was for.  They have been told that she has COPD and a recent diagnosis of COVID-19 but are unaware of any other medical problems.    Per chart review, patient's medical history is notable for the following: DM2, HTN, COPD, depression, diastolic CHF, HLD, GERD.  She was hospitalized 3 weeks ago for diabetic foot infection and osteomyelitis.  She was discharged on 12/23 to rehab facility.  History per husband: Patient's husband was visiting her over the weekend.  He states that on Friday, Saturday, and Sunday, she was minimally responsive.  He asked the staff at the facility why she was like that.  They stated that it may be due to sedating medications.  Patient had improvement in her mentation yesterday but was not at her baseline.  At that time, she seemed confused.    Home Medications Prior to Admission medications   Medication Sig Start Date End Date Taking? Authorizing Provider  albuterol (VENTOLIN HFA) 108 (90 Base) MCG/ACT inhaler Inhale 2 puffs into the lungs every 6 (six) hours as needed for wheezing or shortness of breath.   Yes  [provider]  amLODipine (NORVASC) 5 MG tablet Take 1 tablet (5 mg total) by mouth daily. 11/27/21  Yes Johnson, Clanford L, MD  atorvastatin (LIPITOR) 10 MG tablet Take 10 mg by mouth at bedtime. 09/29/21  Yes [provider]  cefTRIAXone (ROCEPHIN) IVPB Inject 2 g into the vein daily for 24 days. Indication:  Diabetic foot infection/Osteomyelitis left foot First Dose: Yes Last Day of Therapy:  12/20/2021 Labs - Once weekly:  CBC/D and BMP, Labs - Every other week:  ESR and CRP Method of administration: IV Push Method of administration may be changed at the discretion of home infusion pharmacist based upon assessment of the patient and/or caregiver's ability to self-administer the medication ordered. 11/27/21 12/21/21 Yes Johnson, Clanford L, MD  furosemide (LASIX) 20 MG tablet Take 20 mg by mouth 2 (two) times daily.   Yes [provider]  gabapentin (NEURONTIN) 100 MG capsule Take 1 capsule (100 mg total) by mouth 3 (three) times daily. 11/26/21  Yes Johnson, Clanford L, MD  Glucerna (GLUCERNA) LIQD Take 237 mLs by mouth daily. For wound healing   Yes [provider]  hydrALAZINE (APRESOLINE) 25 MG tablet Take 1 tablet (25 mg total) by mouth every 8 (eight) hours. 11/26/21  Yes Johnson, Clanford L, MD  hydrOXYzine (ATARAX/VISTARIL) 10 MG tablet Take 10 mg by mouth every 8 (eight) hours as needed for itching or anxiety.  11/22/19  Yes [provider]  insulin glargine-yfgn (SEMGLEE) 100 UNIT/ML  injection Inject 0.35 mLs (35 Units total) into the skin at bedtime. 11/26/21  Yes Johnson, Clanford L, MD  INSULIN LISPRO, 1 UNIT DIAL, [place] Inject 12 Units into the skin with breakfast, with lunch, and with evening meal.   Yes [provider]  magnesium oxide (MAG-OX) 400 (240 Mg) MG tablet Take 1 tablet by mouth daily. 11/07/21  Yes [provider]  omeprazole (PRILOSEC) 20 MG capsule Take 20 mg by mouth 2 (two) times daily before a meal.    Yes [provider]  potassium chloride SA (K-DUR) 20 MEQ tablet Take 20 mEq by mouth daily.   Yes [provider]  propranolol ER (INDERAL LA) 80 MG 24 hr capsule Take 80 mg by mouth daily. 12/28/19  Yes [provider]  sertraline (ZOLOFT) 50 MG tablet Take 3 tablets (150 mg total) by mouth daily. Patient taking differently: Take 50 mg by mouth daily. 11/27/21  Yes Johnson, Clanford L, MD  vancomycin IVPB Inject 1,000 mg into the vein daily for 25 days. Indication:  Diabetic foot infection/Osteomyelitis left foot First Dose: Yes Last Day of Therapy:  12/20/2021 Labs - Sunday/Monday:  CBC/D, BMP, and vancomycin trough. Labs - Thursday:  BMP and vancomycin trough Labs - Every other week:  ESR and CRP Method of administration:Elastomeric Method of administration may be changed at the discretion of the patient and/or caregiver's ability to self-administer the medication ordered. 11/26/21 12/21/21 Yes Johnson, Clanford L, MD  amoxicillin-clavulanate (AUGMENTIN) 875-125 MG tablet Take 1 tablet by mouth 2 (two) times daily for 14 days. 12/21/21 01/04/22  Johnson, Clanford L, MD  doxycycline (VIBRAMYCIN) 100 MG capsule Take 1 capsule (100 mg total) by mouth 2 (two) times daily for 14 days. Patient not taking: Reported on 12/16/2021 12/21/21 01/04/22  Irwin Brakeman L, MD  insulin aspart (NOVOLOG) 100 UNIT/ML injection Inject 12 Units into the skin 3 (three) times daily with meals. Give only if eats 50% or more of meal. Patient not taking: Reported on 12/16/2021 11/26/21   Murlean Iba, MD  Nystatin (GERHARDT'S BUTT CREAM) CREA As directed Patient not taking: Reported on 12/16/2021 11/26/21   Murlean Iba, MD  nystatin (MYCOSTATIN/NYSTOP) powder Apply topically 2 (two) times daily. Patient not taking: Reported on 12/16/2021 11/26/21   Murlean Iba, MD  saccharomyces boulardii (FLORASTOR) 250 MG capsule Take 1 capsule (250 mg total) by mouth 2 (two) times  daily. Patient not taking: Reported on 12/16/2021 11/26/21 02/24/22  Murlean Iba, MD      Allergies    Carvedilol, Benicar [olmesartan], Codeine, Sulfa antibiotics, and Trulicity [dulaglutide]    Review of Systems   Review of Systems  Unable to perform ROS: Mental status change   Physical Exam Updated Vital Signs BP (!) 159/56    Pulse 66    Temp 97.9 F (36.6 C) (Axillary)    Resp 19    Ht '5\' 1"'  (1.549 m)    Wt (!) 177.4 kg    LMP 08/06/2012    SpO2 97%    BMI 73.90 kg/m  Physical Exam Constitutional:      General: She is in acute distress.     Appearance: She is obese. She is ill-appearing.     Interventions: Face mask in place.  HENT:     Head: Normocephalic and atraumatic.     Right Ear: External ear normal.     Left Ear: External ear normal.     Nose: Nose normal.  Eyes:  General: No scleral icterus.    Conjunctiva/sclera: Conjunctivae normal.  Cardiovascular:     Rate and Rhythm: Normal rate and regular rhythm.  Pulmonary:     Effort: Respiratory distress present.     Breath sounds: Decreased air movement present. Decreased breath sounds, wheezing and rhonchi present.  Abdominal:     General: There is no distension.     Palpations: Abdomen is soft.     Tenderness: There is no guarding.  Musculoskeletal:     Cervical back: Neck supple.  Skin:    General: Skin is cool and dry.     Coloration: Skin is pale.     Comments: Scabbed wounds on lower extremities  Neurological:     Mental Status: She is lethargic.     GCS: GCS eye subscore is 4. GCS verbal subscore is 2. GCS motor subscore is 4.     Cranial Nerves: No facial asymmetry.    ED Results / Procedures / Treatments   Labs (all labs ordered are listed, but only abnormal results are displayed) Labs Reviewed  RESP PANEL BY RT-PCR (FLU A&B, COVID) ARPGX2 - Abnormal; Notable for the following components:      Result Value   SARS Coronavirus 2 by RT PCR POSITIVE (*)    All other components within  normal limits  BLOOD GAS, ARTERIAL - Abnormal; Notable for the following components:   pCO2 arterial 57.6 (*)    pO2, Arterial 212 (*)    Bicarbonate 31.9 (*)    Acid-Base Excess 9.3 (*)    All other components within normal limits  COMPREHENSIVE METABOLIC PANEL - Abnormal; Notable for the following components:   Potassium 2.9 (*)    Chloride 94 (*)    CO2 33 (*)    Glucose, Bld <20 (*)    Creatinine, Ser 1.21 (*)    Calcium 7.9 (*)    Albumin 2.4 (*)    Alkaline Phosphatase 127 (*)    Total Bilirubin 0.2 (*)    GFR, Estimated 51 (*)    All other components within normal limits  CBC WITH DIFFERENTIAL/PLATELET - Abnormal; Notable for the following components:   MCH 25.5 (*)    RDW 15.9 (*)    Lymphs Abs 0.5 (*)    All other components within normal limits  MAGNESIUM - Abnormal; Notable for the following components:   Magnesium 1.5 (*)    All other components within normal limits  GLUCOSE, CAPILLARY - Abnormal; Notable for the following components:   Glucose-Capillary 57 (*)    All other components within normal limits  GLUCOSE, CAPILLARY - Abnormal; Notable for the following components:   Glucose-Capillary 134 (*)    All other components within normal limits  COMPREHENSIVE METABOLIC PANEL - Abnormal; Notable for the following components:   Chloride 96 (*)    CO2 33 (*)    Glucose, Bld 110 (*)    Creatinine, Ser 1.30 (*)    Calcium 7.7 (*)    Total Protein 5.9 (*)    Albumin 2.1 (*)    GFR, Estimated 47 (*)    All other components within normal limits  MAGNESIUM - Abnormal; Notable for the following components:   Magnesium 1.6 (*)    All other components within normal limits  CBC WITH DIFFERENTIAL/PLATELET - Abnormal; Notable for the following components:   Hemoglobin 11.1 (*)    HCT 34.7 (*)    MCH 25.7 (*)    RDW 15.6 (*)    Lymphs Abs 0.3 (*)  All other components within normal limits  TSH - Abnormal; Notable for the following components:   TSH 5.196 (*)     All other components within normal limits  BLOOD GAS, VENOUS - Abnormal; Notable for the following components:   pO2, Ven 55.4 (*)    Bicarbonate 30.2 (*)    Acid-Base Excess 7.9 (*)    All other components within normal limits  GLUCOSE, CAPILLARY - Abnormal; Notable for the following components:   Glucose-Capillary 115 (*)    All other components within normal limits  GLUCOSE, CAPILLARY - Abnormal; Notable for the following components:   Glucose-Capillary 110 (*)    All other components within normal limits  GLUCOSE, CAPILLARY - Abnormal; Notable for the following components:   Glucose-Capillary 109 (*)    All other components within normal limits  GLUCOSE, CAPILLARY - Abnormal; Notable for the following components:   Glucose-Capillary 118 (*)    All other components within normal limits  GLUCOSE, CAPILLARY - Abnormal; Notable for the following components:   Glucose-Capillary 145 (*)    All other components within normal limits  GLUCOSE, CAPILLARY - Abnormal; Notable for the following components:   Glucose-Capillary <10 (*)    All other components within normal limits  CBG MONITORING, ED - Abnormal; Notable for the following components:   Glucose-Capillary <10 (*)    All other components within normal limits  CBG MONITORING, ED - Abnormal; Notable for the following components:   Glucose-Capillary 227 (*)    All other components within normal limits  CBG MONITORING, ED - Abnormal; Notable for the following components:   Glucose-Capillary 126 (*)    All other components within normal limits  CULTURE, BLOOD (ROUTINE X 2)  CULTURE, BLOOD (ROUTINE X 2)  MRSA NEXT GEN BY PCR, NASAL  URINE CULTURE  LACTIC ACID, PLASMA  LACTIC ACID, PLASMA  PROTIME-INR  PROTIME-INR  CORTISOL-AM, BLOOD  PROCALCITONIN  LACTIC ACID, PLASMA  URINALYSIS, ROUTINE W REFLEX MICROSCOPIC  T4, FREE  CBG MONITORING, ED    EKG EKG Interpretation  Date/Time:  Tuesday December 15 2021 22:25:03  EST Ventricular Rate:  71 PR Interval:  143 QRS Duration: 121 QT Interval:  466 QTC Calculation: 507 R Axis:   -50 Text Interpretation: Sinus rhythm Nonspecific IVCD with LAD LVH with secondary repolarization abnormality Confirmed by Godfrey Pick [phone]) on 12/15/2021 11:12:05 PM  Radiology CT Head Wo Contrast  Result Date: 12/16/2021 CLINICAL DATA:  Mental status changes. EXAM: CT HEAD WITHOUT CONTRAST TECHNIQUE: Contiguous axial images were obtained from the base of the skull through the vertex without intravenous contrast. RADIATION DOSE REDUCTION: This exam was performed according to the departmental dose-optimization program which includes automated exposure control, adjustment of the mA and/or kV according to patient size and/or use of iterative reconstruction technique. COMPARISON:  11/26/2021 FINDINGS: Brain: There is atrophy and chronic small vessel disease changes. No acute intracranial abnormality. Specifically, no hemorrhage, hydrocephalus, mass lesion, acute infarction, or significant intracranial injury. Vascular: No hyperdense vessel or unexpected calcification. Skull: No acute calvarial abnormality. Sinuses/Orbits: Mucosal thickening throughout the paranasal sinuses. No air-fluid levels. Other: None IMPRESSION: Atrophy, chronic microvascular disease. No acute intracranial abnormality. Electronically Signed   By: Rolm Baptise M.D.   On: 12/16/2021 01:42   CT CHEST WO CONTRAST  Result Date: 12/16/2021 CLINICAL DATA:  Pneumonia EXAM: CT CHEST WITHOUT CONTRAST TECHNIQUE: Multidetector CT imaging of the chest was performed following the standard protocol without IV contrast. RADIATION DOSE REDUCTION: This exam was performed according to the  departmental dose-optimization program which includes automated exposure control, adjustment of the mA and/or kV according to patient size and/or use of iterative reconstruction technique. COMPARISON:  Previous CT done on 09/02/2008 chest radiograph done on  12/15/2021 FINDINGS: Cardiovascular: Coronary artery calcifications are seen. There is dense calcification in the mitral annulus. Heart is enlarged in size. There is ectasia of main pulmonary artery measuring 3.6 cm. Mediastinum/Nodes: There are subcentimeter nodes in the mediastinum. Lungs/Pleura: There is interval improvement in aeration in the right upper lobe. Extensive patchy alveolar and ground-glass infiltrates are seen in both lungs more so on the right side. Small bilateral pleural effusions are seen, more so on the right side. Upper Abdomen: Surgical clips are seen in gallbladder fossa. Spleen is enlarged measuring 16.6 cm in AP diameter. Musculoskeletal: Unremarkable. IMPRESSION: There are extensive scattered patchy alveolar and ground-glass infiltrates in both lungs, more so on the right side. Findings suggest multifocal pneumonia in both lungs. Part of this finding may suggest underlying pulmonary edema. Bilateral pleural effusions, more so on the right side. There is interval improvement in aeration of right upper lobe, possibly suggesting interval resolution of atelectasis. Coronary artery calcifications are seen. There is ectasia of main pulmonary artery suggesting pulmonary arterial hypertension. Splenomegaly. Electronically Signed   By: Elmer Picker M.D.   On: 12/16/2021 14:42   DG Chest Portable 1 View  Result Date: 12/15/2021 CLINICAL DATA:  sob EXAM: PORTABLE CHEST 1 VIEW COMPARISON:  Chest x-ray 11/26/2021, CT chest 09/01/2008 FINDINGS: Enlarged cardiac silhouette. The heart and mediastinal contours are unchanged. Complete opacification of the right upper lobe. Patchy airspace opacity within the right middle and lower lobes. No pulmonary edema. Likely trace bilateral pleural effusions. No pneumothorax. No acute osseous abnormality. IMPRESSION: 1. Complete opacification of the right upper lobe. Patchy airspace opacity within the right middle and lower lobes. Findings suggestive of  multifocal pneumonia. Followup PA and lateral chest X-ray is recommended in 3-4 weeks following therapy to ensure resolution. 2. Likely trace bilateral pleural effusions. Electronically Signed   By: Iven Finn M.D.   On: 12/15/2021 22:55    Procedures Procedures    Medications Ordered in ED Medications  etomidate (AMIDATE) injection 20 mg (20 mg Intravenous Not Given 12/16/21 0619)  rocuronium bromide 10 mg/mL (PF) syringe (100 mg Intravenous Not Given 12/16/21 0619)  dextrose 50 % solution (  Not Given 12/15/21 2256)  nitroGLYCERIN 50 mg in dextrose 5 % 250 mL (0.2 mg/mL) infusion (0 mcg/min Intravenous Paused 12/16/21 0500)  metroNIDAZOLE (FLAGYL) IVPB 500 mg (500 mg Intravenous New Bag/Given 12/16/21 0432)  amLODipine (NORVASC) tablet 5 mg (5 mg Oral Given 12/16/21 1057)  atorvastatin (LIPITOR) tablet 10 mg (has no administration in time range)  hydrALAZINE (APRESOLINE) tablet 25 mg (25 mg Oral Not Given 12/16/21 0528)  propranolol ER (INDERAL LA) 24 hr capsule 80 mg (has no administration in time range)  hydrOXYzine (ATARAX) tablet 10 mg (has no administration in time range)  sertraline (ZOLOFT) tablet 150 mg (150 mg Oral Given 12/16/21 1057)  pantoprazole (PROTONIX) EC tablet 40 mg (40 mg Oral Given 12/16/21 1057)  gabapentin (NEURONTIN) capsule 100 mg (100 mg Oral Given 12/16/21 1057)  heparin injection 5,000 Units (5,000 Units Subcutaneous Given 12/16/21 2353)  acetaminophen (TYLENOL) tablet 650 mg (has no administration in time range)    Or  acetaminophen (TYLENOL) suppository 650 mg (has no administration in time range)  oxyCODONE (Oxy IR/ROXICODONE) immediate release tablet 5 mg (has no administration in time range)  morphine 2  MG/ML injection 2 mg (has no administration in time range)  ondansetron (ZOFRAN) tablet 4 mg (has no administration in time range)    Or  ondansetron (ZOFRAN) injection 4 mg (has no administration in time range)  albuterol (PROVENTIL) (2.5 MG/3ML) 0.083%  nebulizer solution 2.5 mg (has no administration in time range)  methylPREDNISolone sodium succinate (SOLU-MEDROL) 125 mg/2 mL injection 125 mg (125 mg Intravenous Given 12/16/21 1057)  feeding supplement (ENSURE ENLIVE / ENSURE PLUS) liquid 237 mL (237 mLs Oral Not Given 12/16/21 1057)  vancomycin (VANCOREADY) IVPB 1500 mg/300 mL (has no administration in time range)  ceFEPIme (MAXIPIME) 2 g in sodium chloride 0.9 % 100 mL IVPB (2 g Intravenous New Bag/Given 12/16/21 0759)  remdesivir 100 mg in sodium chloride 0.9 % 100 mL IVPB (has no administration in time range)  Chlorhexidine Gluconate Cloth 2 % PADS 6 each (6 each Topical Given 12/16/21 1124)  dextrose 50 % solution 100 mL (100 mLs Intravenous Given 12/15/21 2252)  albuterol (PROVENTIL) (2.5 MG/3ML) 0.083% nebulizer solution 5 mg (5 mg Nebulization Given 12/15/21 2246)  methylPREDNISolone sodium succinate (SOLU-MEDROL) 125 mg/2 mL injection 125 mg (125 mg Intravenous Given 12/15/21 2333)  ceFEPIme (MAXIPIME) 2 g in sodium chloride 0.9 % 100 mL IVPB (0 g Intravenous Stopped 12/16/21 0730)  vancomycin (VANCOREADY) IVPB 2000 mg/400 mL (0 mg Intravenous Stopped 12/16/21 0731)  dextrose 50 % solution 50 mL (50 mLs Intravenous Given 12/16/21 0225)  magnesium sulfate IVPB 2 g 50 mL (0 g Intravenous Stopped 12/16/21 0731)  potassium chloride 10 mEq in 100 mL IVPB (0 mEq Intravenous Stopped 12/16/21 0731)  dextrose 50 % solution 50 mL (0 mLs Intravenous Duplicate 3/53/61 4431)  remdesivir 100 mg in sodium chloride 0.9 % 100 mL IVPB (100 mg Intravenous New Bag/Given 12/16/21 1055)  alteplase (CATHFLO ACTIVASE) injection 2 mg (2 mg Intracatheter Given 12/16/21 1105)  gadobutrol (GADAVIST) 1 MMOL/ML injection 10 mL (10 mLs Intravenous Contrast Given 12/16/21 1454)    ED Course/ Medical Decision Making/ A&P                           Medical Decision Making  This patient presents to the ED for concern of altered mental status and respiratory distress, this  involves an extensive number of treatment options, and is a complaint that carries with it a high risk of complications and morbidity.  The differential diagnosis includes CVA, sepsis, COPD exacerbation, CHF exacerbation, trauma, polypharmacy, drug intoxication, drug withdrawal, hypoglycemia   Co morbidities that complicate the patient evaluation  DM2, obesity, osteomyelitis, CHF, COPD, recent hospitalization, current nursing facility care   Additional history obtained:  Additional history obtained from patient's husband External records from outside source obtained and reviewed including EMR   Lab Tests:  I Ordered, and personally interpreted labs.  The pertinent results include: Initial blood glucose of <10; compensated respiratory acidosis   Imaging Studies ordered:  I ordered imaging studies including chest x-ray I independently visualized and interpreted imaging which showed complete opacification of right upper lobe and patchy opacifications of right lower and middle lobes.  Findings suggestive of multifocal pneumonia and/or aspiration I agree with the radiologist interpretation   Cardiac Monitoring:  The patient was maintained on a cardiac monitor.  I personally viewed and interpreted the cardiac monitored which showed an underlying rhythm of: Sinus rhythm   Medicines ordered and prescription drug management:  I ordered medication including D50 for treatment of severe hypoglycemia; broad-spectrum antibiotics  for treatment of HCAP Reevaluation of the patient after these medicines showed that the patient improved I have reviewed the patients home medicines and have made adjustments as needed   Test Considered:  Following discussion with patient's husband, he states that she has had altered mental status for several days, CT head was ordered.  Patient was admitted to hospitalist prior to this study.   Critical Interventions:  2 ampoules of D50 following initial blood  glucose reading of <10.  Broad-spectrum antibiotics for empiric treatment of multifocal pneumonia.  Problem List / ED Course:  Patient is a 61 year old female with multiple chronic comorbidities, currently residing in a nursing facility following a hospitalization for osteomyelitis, presenting for an initial report of respiratory distress.  When she arrived in the ED, she was a GCS of 10.  Breathing was labored with coarse lung sounds and wheezing.  Respirations were assisted with BVM.  She was able to maintain normal SPO2.  She was placed on BiPAP.  Blood glucose was checked and found to be less than 10.  This was likely the primary cause of her current condition.  2 ampoules of D50 was given with the patient subsequently awakening.  She was kept on BiPAP due to the coarse lung sounds and wheezing.  Inline albuterol was ordered.  X-ray shows concern for multifocal pneumonia.  This could also be secondary to aspiration and/or atelectasis from prolonged hypoglycemia and subsequent poor respirations prior to arrival.  Broad-spectrum antibiotics were ordered for treatment of HCAP.  Patient does have a known COVID-19 which is also likely contributing to her multifocal opacities.  Patient was admitted to hospitalist for further management.   Reevaluation:  After the interventions noted above, I reevaluated the patient and found that they have :improved   Social Determinants of Health:  Patient is chronically ill and currently residing in a nursing facility.  Per chart review, she has had poor understanding of her medical conditions in the past.  Her husband reports that she has had altered mental status for several days.  She has poor mobility which is likely to add to or exacerbate her existing medical conditions.   Dispostion:  After consideration of the diagnostic results and the patients response to treatment, I feel that the patent would benefit from admission to hospital .   CRITICAL  CARE Performed by: Godfrey Pick   Total critical care time: 35 minutes  Critical care time was exclusive of separately billable procedures and treating other patients.  Critical care was necessary to treat or prevent imminent or life-threatening deterioration.  Critical care was time spent personally by me on the following activities: development of treatment plan with patient and/or surrogate as well as nursing, discussions with consultants, evaluation of patient's response to treatment, examination of patient, obtaining history from patient or surrogate, ordering and performing treatments and interventions, ordering and review of laboratory studies, ordering and review of radiographic studies, pulse oximetry and re-evaluation of patient's condition.        Final Clinical Impression(s) / ED Diagnoses Final diagnoses:  Hypoglycemia  Respiratory distress  Acute respiratory failure with hypoxia (HCC)  Glasgow coma scale total score 9-12, in the field (EMT or ambulance)    Rx / DC Orders ED Discharge Orders     None         Godfrey Pick, MD 12/16/21 [phone]

## 2021-12-15 NOTE — ED Triage Notes (Signed)
Per facility pt's o2 was in 60s on oxygen, when ems arrived pt was in the 80s. Pt placed on NRB and is 95%

## 2021-12-15 NOTE — Progress Notes (Addendum)
BIPAP setting changed due to abg results

## 2021-12-16 ENCOUNTER — Encounter (HOSPITAL_COMMUNITY): Payer: Self-pay | Admitting: Family Medicine

## 2021-12-16 ENCOUNTER — Inpatient Hospital Stay (HOSPITAL_COMMUNITY): Payer: Medicare HMO

## 2021-12-16 DIAGNOSIS — I110 Hypertensive heart disease with heart failure: Secondary | ICD-10-CM | POA: Diagnosis present

## 2021-12-16 DIAGNOSIS — F05 Delirium due to known physiological condition: Secondary | ICD-10-CM | POA: Diagnosis present

## 2021-12-16 DIAGNOSIS — I169 Hypertensive crisis, unspecified: Secondary | ICD-10-CM | POA: Diagnosis present

## 2021-12-16 DIAGNOSIS — M868X7 Other osteomyelitis, ankle and foot: Secondary | ICD-10-CM | POA: Diagnosis present

## 2021-12-16 DIAGNOSIS — G9341 Metabolic encephalopathy: Secondary | ICD-10-CM | POA: Diagnosis present

## 2021-12-16 DIAGNOSIS — Z66 Do not resuscitate: Secondary | ICD-10-CM | POA: Diagnosis present

## 2021-12-16 DIAGNOSIS — F32A Depression, unspecified: Secondary | ICD-10-CM | POA: Diagnosis present

## 2021-12-16 DIAGNOSIS — Z888 Allergy status to other drugs, medicaments and biological substances status: Secondary | ICD-10-CM | POA: Diagnosis not present

## 2021-12-16 DIAGNOSIS — L89893 Pressure ulcer of other site, stage 3: Secondary | ICD-10-CM | POA: Diagnosis present

## 2021-12-16 DIAGNOSIS — J1282 Pneumonia due to coronavirus disease 2019: Secondary | ICD-10-CM | POA: Diagnosis present

## 2021-12-16 DIAGNOSIS — Y95 Nosocomial condition: Secondary | ICD-10-CM | POA: Diagnosis present

## 2021-12-16 DIAGNOSIS — L89320 Pressure ulcer of left buttock, unstageable: Secondary | ICD-10-CM | POA: Diagnosis present

## 2021-12-16 DIAGNOSIS — Z6841 Body Mass Index (BMI) 40.0 and over, adult: Secondary | ICD-10-CM | POA: Diagnosis not present

## 2021-12-16 DIAGNOSIS — E11649 Type 2 diabetes mellitus with hypoglycemia without coma: Secondary | ICD-10-CM | POA: Diagnosis present

## 2021-12-16 DIAGNOSIS — Z882 Allergy status to sulfonamides status: Secondary | ICD-10-CM | POA: Diagnosis not present

## 2021-12-16 DIAGNOSIS — A4189 Other specified sepsis: Secondary | ICD-10-CM | POA: Diagnosis present

## 2021-12-16 DIAGNOSIS — N179 Acute kidney failure, unspecified: Secondary | ICD-10-CM | POA: Diagnosis present

## 2021-12-16 DIAGNOSIS — J9601 Acute respiratory failure with hypoxia: Secondary | ICD-10-CM | POA: Diagnosis present

## 2021-12-16 DIAGNOSIS — U071 COVID-19: Secondary | ICD-10-CM | POA: Diagnosis present

## 2021-12-16 DIAGNOSIS — E1169 Type 2 diabetes mellitus with other specified complication: Secondary | ICD-10-CM | POA: Diagnosis present

## 2021-12-16 DIAGNOSIS — I5032 Chronic diastolic (congestive) heart failure: Secondary | ICD-10-CM | POA: Diagnosis present

## 2021-12-16 DIAGNOSIS — E8809 Other disorders of plasma-protein metabolism, not elsewhere classified: Secondary | ICD-10-CM | POA: Diagnosis present

## 2021-12-16 DIAGNOSIS — J440 Chronic obstructive pulmonary disease with acute lower respiratory infection: Secondary | ICD-10-CM | POA: Diagnosis present

## 2021-12-16 LAB — MAGNESIUM: Magnesium: 1.6 mg/dL — ABNORMAL LOW (ref 1.7–2.4)

## 2021-12-16 LAB — COMPREHENSIVE METABOLIC PANEL
ALT: 17 U/L (ref 0–44)
ALT: 13 U/L (ref 0–44)
AST: 25 U/L (ref 15–41)
AST: 21 U/L (ref 15–41)
Albumin: 2.4 g/dL — ABNORMAL LOW (ref 3.5–5.0)
Albumin: 2.1 g/dL — ABNORMAL LOW (ref 3.5–5.0)
Alkaline Phosphatase: 127 U/L — ABNORMAL HIGH (ref 38–126)
Alkaline Phosphatase: 104 U/L (ref 38–126)
Anion gap: 9 (ref 5–15)
Anion gap: 7 (ref 5–15)
BUN: 17 mg/dL (ref 6–20)
BUN: 19 mg/dL (ref 6–20)
CO2: 33 mmol/L — ABNORMAL HIGH (ref 22–32)
CO2: 33 mmol/L — ABNORMAL HIGH (ref 22–32)
Calcium: 7.9 mg/dL — ABNORMAL LOW (ref 8.9–10.3)
Calcium: 7.7 mg/dL — ABNORMAL LOW (ref 8.9–10.3)
Chloride: 94 mmol/L — ABNORMAL LOW (ref 98–111)
Chloride: 96 mmol/L — ABNORMAL LOW (ref 98–111)
Creatinine, Ser: 1.21 mg/dL — ABNORMAL HIGH (ref 0.44–1.00)
Creatinine, Ser: 1.3 mg/dL — ABNORMAL HIGH (ref 0.44–1.00)
GFR, Estimated: 51 mL/min — ABNORMAL LOW (ref >60)
GFR, Estimated: 47 mL/min — ABNORMAL LOW (ref >60)
Glucose, Bld: <20 mg/dL — CL (ref 70–99)
Glucose, Bld: 110 mg/dL — ABNORMAL HIGH (ref 70–99)
Potassium: 2.9 mmol/L — ABNORMAL LOW (ref 3.5–5.1)
Potassium: 3.6 mmol/L (ref 3.5–5.1)
Sodium: 136 mmol/L (ref 135–145)
Sodium: 136 mmol/L (ref 135–145)
Total Bilirubin: 0.2 mg/dL — ABNORMAL LOW (ref 0.3–1.2)
Total Bilirubin: 0.4 mg/dL (ref 0.3–1.2)
Total Protein: 6.7 g/dL (ref 6.5–8.1)
Total Protein: 5.9 g/dL — ABNORMAL LOW (ref 6.5–8.1)

## 2021-12-16 LAB — GLUCOSE, CAPILLARY
Glucose-Capillary: <10 mg/dL — CL (ref 70–99)
Glucose-Capillary: 57 mg/dL — ABNORMAL LOW (ref 70–99)
Glucose-Capillary: 134 mg/dL — ABNORMAL HIGH (ref 70–99)
Glucose-Capillary: 115 mg/dL — ABNORMAL HIGH (ref 70–99)
Glucose-Capillary: 110 mg/dL — ABNORMAL HIGH (ref 70–99)
Glucose-Capillary: 109 mg/dL — ABNORMAL HIGH (ref 70–99)
Glucose-Capillary: 118 mg/dL — ABNORMAL HIGH (ref 70–99)
Glucose-Capillary: 145 mg/dL — ABNORMAL HIGH (ref 70–99)
Glucose-Capillary: 271 mg/dL — ABNORMAL HIGH (ref 70–99)
Glucose-Capillary: 257 mg/dL — ABNORMAL HIGH (ref 70–99)

## 2021-12-16 LAB — RESP PANEL BY RT-PCR (FLU A&B, COVID) ARPGX2
Influenza A by PCR: NEGATIVE
Influenza B by PCR: NEGATIVE
SARS Coronavirus 2 by RT PCR: POSITIVE — AB

## 2021-12-16 LAB — CBC WITH DIFFERENTIAL/PLATELET
Abs Immature Granulocytes: 0.02 10*3/uL (ref 0.00–0.07)
Basophils Absolute: 0 10*3/uL (ref 0.0–0.1)
Basophils Relative: 1 %
Eosinophils Absolute: 0.1 10*3/uL (ref 0.0–0.5)
Eosinophils Relative: 1 %
HCT: 34.7 % — ABNORMAL LOW (ref 36.0–46.0)
Hemoglobin: 11.1 g/dL — ABNORMAL LOW (ref 12.0–15.0)
Immature Granulocytes: 0 %
Lymphocytes Relative: 5 %
Lymphs Abs: 0.3 10*3/uL — ABNORMAL LOW (ref 0.7–4.0)
MCH: 25.7 pg — ABNORMAL LOW (ref 26.0–34.0)
MCHC: 32 g/dL (ref 30.0–36.0)
MCV: 80.3 fL (ref 80.0–100.0)
Monocytes Absolute: 0.2 10*3/uL (ref 0.1–1.0)
Monocytes Relative: 3 %
Neutro Abs: 5.2 10*3/uL (ref 1.7–7.7)
Neutrophils Relative %: 90 %
Platelets: 220 10*3/uL (ref 150–400)
RBC: 4.32 MIL/uL (ref 3.87–5.11)
RDW: 15.6 % — ABNORMAL HIGH (ref 11.5–15.5)
WBC: 5.8 10*3/uL (ref 4.0–10.5)
nRBC: 0 % (ref 0.0–0.2)

## 2021-12-16 LAB — BLOOD GAS, VENOUS
Acid-Base Excess: 7.9 mmol/L — ABNORMAL HIGH (ref 0.0–2.0)
Bicarbonate: 30.2 mmol/L — ABNORMAL HIGH (ref 20.0–28.0)
FIO2: 40
O2 Saturation: 87.6 %
Patient temperature: 36
pCO2, Ven: 57 mmHg (ref 44.0–60.0)
pH, Ven: 7.379 (ref 7.250–7.430)
pO2, Ven: 55.4 mmHg — ABNORMAL HIGH (ref 32.0–45.0)

## 2021-12-16 LAB — CORTISOL-AM, BLOOD: Cortisol - AM: 20.2 ug/dL (ref 6.7–22.6)

## 2021-12-16 LAB — MRSA NEXT GEN BY PCR, NASAL: MRSA by PCR Next Gen: NOT DETECTED

## 2021-12-16 LAB — PROTIME-INR
INR: 1 (ref 0.8–1.2)
Prothrombin Time: 13.5 seconds (ref 11.4–15.2)

## 2021-12-16 LAB — T4, FREE: Free T4: 0.86 ng/dL (ref 0.61–1.12)

## 2021-12-16 LAB — TSH: TSH: 5.196 u[IU]/mL — ABNORMAL HIGH (ref 0.350–4.500)

## 2021-12-16 LAB — CBG MONITORING, ED: Glucose-Capillary: 85 mg/dL (ref 70–99)

## 2021-12-16 LAB — PROCALCITONIN: Procalcitonin: <0.1 ng/mL

## 2021-12-16 LAB — LACTIC ACID, PLASMA
Lactic Acid, Venous: 0.9 mmol/L (ref 0.5–1.9)
Lactic Acid, Venous: 0.7 mmol/L (ref 0.5–1.9)

## 2021-12-16 IMAGING — MR MR FOOT*L* WO/W CM
8 series · 40 of 40 positions shown · IV contrast (gadavist)
Comparison: Left foot radiographs [DATE] [DATE] MRI left
forefoot [DATE], CT left foot [DATE]

CLINICAL DATA: Foot swelling, nondiabetic, osteomyelitis suspected.
Diabetic ulcer plantar aspect of left foot 1st MTP joint area.

EXAM:
MRI OF THE LEFT FOREFOOT WITHOUT AND WITH CONTRAST
TECHNIQUE: Multiplanar, multisequence MR imaging of the left forefoot was
performed both before and after administration of intravenous
contrast.
CONTRAST:  10mL GADAVIST GADOBUTROL 1 MMOL/ML IV SOLN

[Series 4: T1 · oblique · left · 3.0mm · 0.38mm/px · 6 of 36 slices shown (1 of 2)]
[im 1/36]
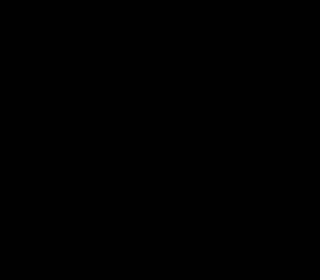
[im 8/36]
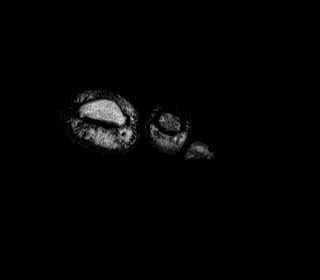
[im 15/36]
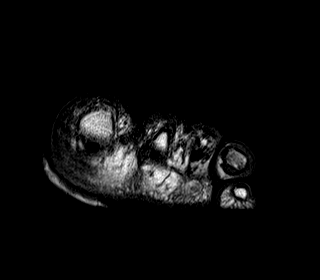
[im 22/36]
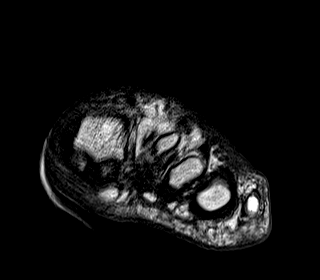
[im 29/36]
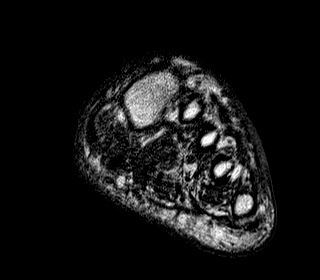
[im 36/36]
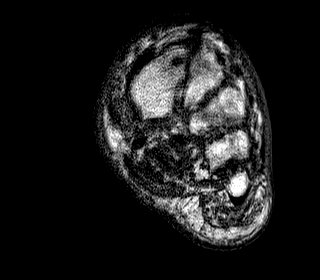

[Series 5: T2 fat-sat · oblique · left · 3.0mm · 0.38mm/px · 6 of 36 slices shown (1 of 2)]
[im 1/36]
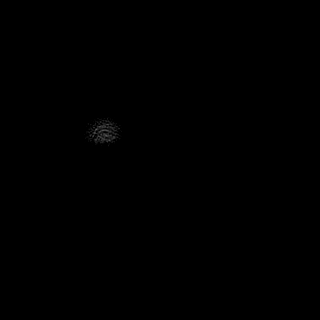
[im 8/36]
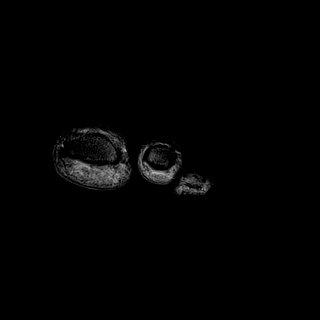
[im 15/36]
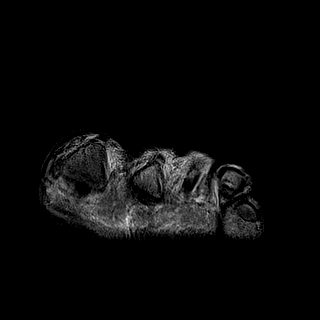
[im 22/36]
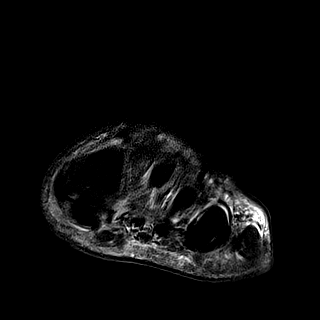
[im 29/36]
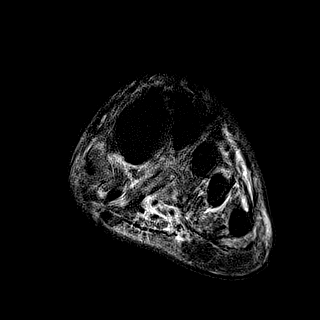
[im 36/36]
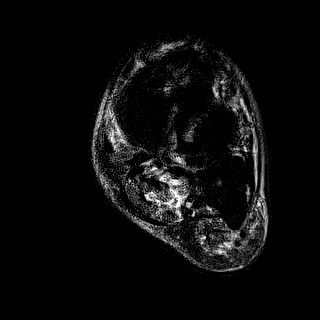

[Series 9: T1 fat-sat · oblique · non-contrast · left · 3.0mm · 0.59mm/px · 6 of 36 slices shown]
[im 1/36]
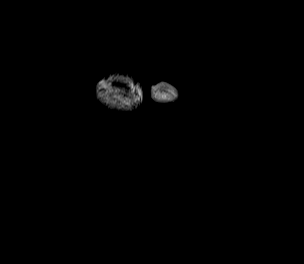
[im 8/36]
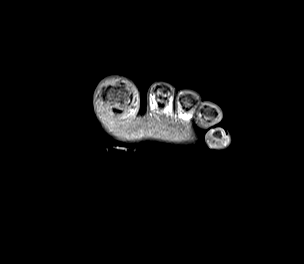
[im 15/36]
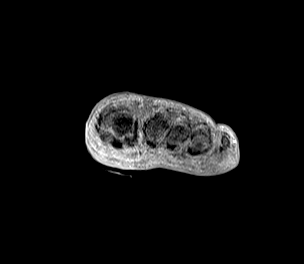
[im 22/36]
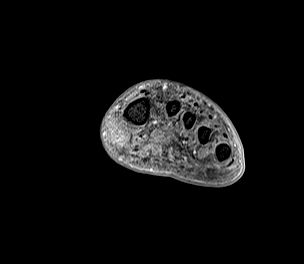
[im 29/36]
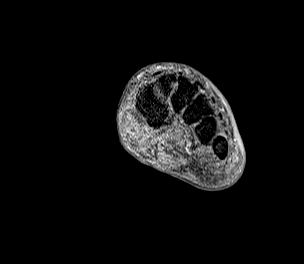
[im 36/36]
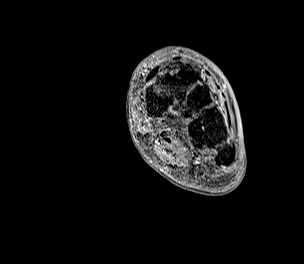

[Series 10: T1 fat-sat post-contrast · oblique · left · 3.0mm · 0.59mm/px · 6 of 36 slices shown (1 of 2)]
[im 1/36]
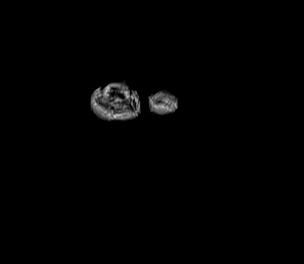
[im 8/36]
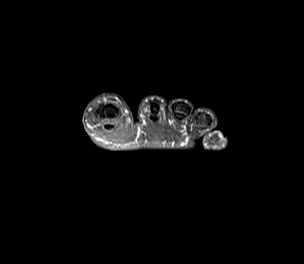
[im 15/36]
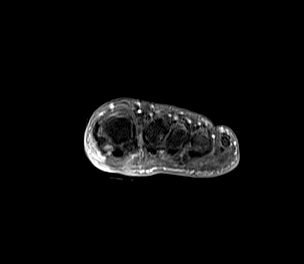
[im 22/36]
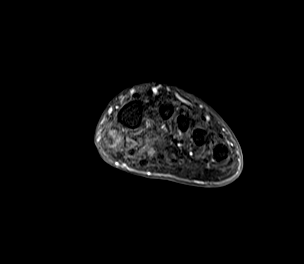
[im 29/36]
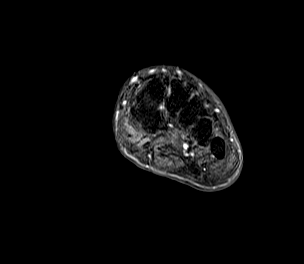
[im 36/36]
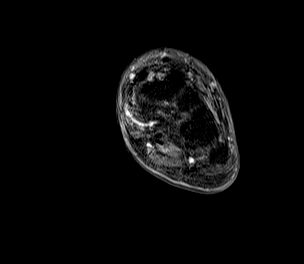

[Series 1028: T1 · oblique · left · 3.0mm · 0.70mm/px · 3 of 20 slices shown (2 of 2)]
[im 1/20]
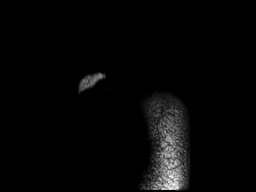
[im 10/20]
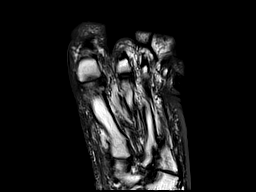
[im 20/20]
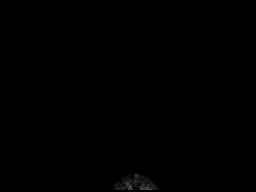

[Series 1032: T2 fat-sat · oblique · left · 3.0mm · 0.70mm/px · 3 of 20 slices shown (2 of 2)]
[im 1/20]
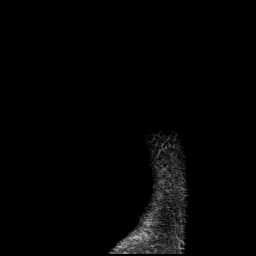
[im 10/20]
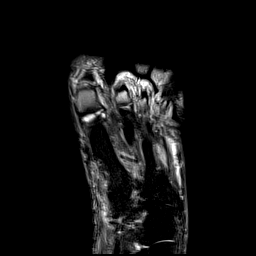
[im 20/20]
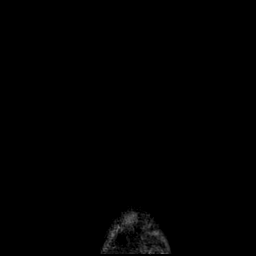

[Series 1036: STIR · sagittal · left · 3.0mm · 0.70mm/px · 5 of 29 slices shown]
[im 1/29]
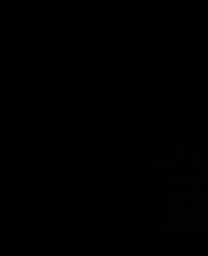
[im 8/29]
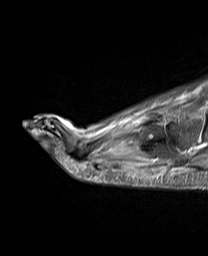
[im 15/29]
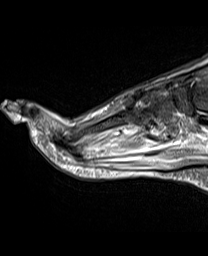
[im 22/29]
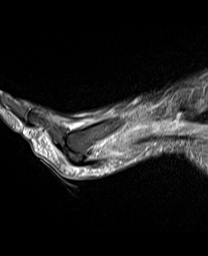
[im 29/29]
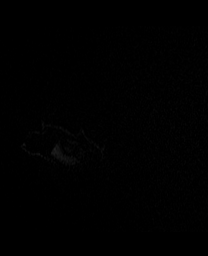

[Series 1042: T1 fat-sat post-contrast · sagittal · left · 3.0mm · 0.70mm/px · 5 of 30 slices shown (2 of 2)]
[im 1/30]
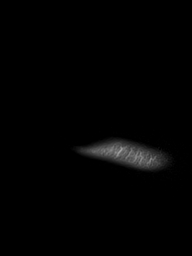
[im 8/30]
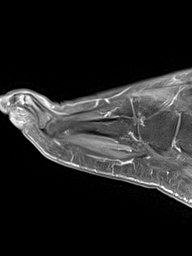
[im 15/30]
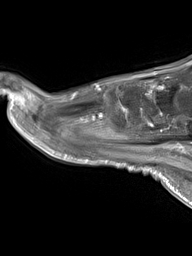
[im 22/30]
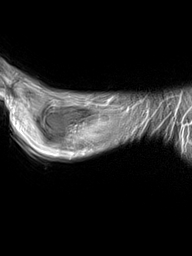
[im 30/30]
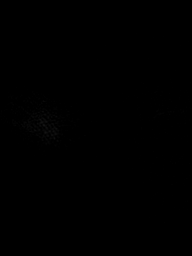

[40 of 40 positions shown; findings below may reference images not displayed]

FINDINGS: Unfortunately, there is moderate to high-grade patient motion
artifact that technically degrades this examination.

Bones/Joint/Cartilage

Motion artifact markedly technically limits evaluation for possible
cortical erosion. In the area of interest of the plantar ulcer at
the level of the great toe metatarsophalangeal joint there is a
bipartite medial great toe metatarsophalangeal joint sesamoid with
diffuse marrow edema on both sides of the synchondrosis. This marrow
edema may be secondary to stress related changes. No definite
cortical erosion is seen to indicate acute osteomyelitis.

There are moderate subchondral cysts with moderate cartilage
thinning at the navicular-medial and navicular-intermediate
cuneiform articulations.

No acute fracture.

Ligaments

The Lisfranc ligament complex is grossly intact. The plantar plates
are grossly intact.

Muscles and Tendons

Mild fluid around the flexor tendons of the midfoot, nonspecific.
Mild edema within the abductor hallucis muscle body with mild
associated enhancement. This suggest a nonspecific myositis.

Soft tissues

There is again thinning of the superficial aspect of the
subcutaneous fat plantar to the metatarsophalangeal joint and
proximal aspect of the proximal phalanx of the great toe, an ulcer
measuring up to approximately 1.5 cm in transverse dimension and
cm in AP dimension with a depth of 0.5 cm.
IMPRESSION: :
IMPRESSION: 1. There is again a chronic soft tissue ulcer at the plantar aspect
of the great toe metatarsophalangeal joint and proximal phalanx.
2. There is marrow edema throughout both sides of a bipartite medial
great toe metatarsophalangeal joint sesamoid close to the ulcer,
however this edema may represent stress reactive change. No
definitive cortical erosion is seen to indicate acute osteomyelitis,
however it is difficult to completely exclude osteomyelitis given
the relative proximity to the chronic soft tissue ulcer.
3. There is enhancement and edema within the abductor hallucis
muscle that extends posteriorly from the region of the plantar
medial forefoot soft tissue ulcer. This is nonspecific. This may
represent bland versus infected myositis.
4. Mild-to-moderate degenerative changes of the navicular-medial and
navicular-intermediate cuneiform articulations.

## 2021-12-16 MED ORDER — PROPRANOLOL HCL ER 80 MG PO CP24
80.0000 mg | ORAL_CAPSULE | Freq: Every day | ORAL | Status: DC
Start: 2021-12-16 — End: 2021-12-19
  Administered 2021-12-18 – 2021-12-19 (×2): 80 mg via ORAL
  Filled 2021-12-16 (×8): qty 1

## 2021-12-16 MED ORDER — VANCOMYCIN HCL 1500 MG/300ML IV SOLN
1500.0000 mg | INTRAVENOUS | Status: DC
  Administered 2021-12-16: 1500 mg via INTRAVENOUS
  Filled 2021-12-16: qty 300

## 2021-12-16 MED ORDER — GADOBUTROL 1 MMOL/ML IV SOLN
10.0000 mL | Freq: Once | INTRAVENOUS | Status: AC | PRN
  Administered 2021-12-16: 10 mL via INTRAVENOUS

## 2021-12-16 MED ORDER — MORPHINE SULFATE (PF) 2 MG/ML IV SOLN: 2.0000 mg | INTRAVENOUS | Status: DC | PRN

## 2021-12-16 MED ORDER — CHLORHEXIDINE GLUCONATE CLOTH 2 % EX PADS
6.0000 | MEDICATED_PAD | Freq: Every day | CUTANEOUS | Status: DC
  Administered 2021-12-16 – 2021-12-19 (×4): 6 via TOPICAL

## 2021-12-16 MED ORDER — METRONIDAZOLE 500 MG/100ML IV SOLN
500.0000 mg | Freq: Two times a day (BID) | INTRAVENOUS | Status: DC
  Administered 2021-12-16 – 2021-12-18 (×5): 500 mg via INTRAVENOUS
  Filled 2021-12-16 (×5): qty 100

## 2021-12-16 MED ORDER — BARICITINIB 2 MG PO TABS: 4.0000 mg | ORAL_TABLET | Freq: Every day | ORAL | Status: DC

## 2021-12-16 MED ORDER — ACETAMINOPHEN 650 MG RE SUPP: 650.0000 mg | Freq: Four times a day (QID) | RECTAL | Status: DC | PRN

## 2021-12-16 MED ORDER — SODIUM CHLORIDE 0.9 % IV SOLN
100.0000 mg | Freq: Every day | INTRAVENOUS | Status: DC
  Administered 2021-12-17 – 2021-12-19 (×3): 100 mg via INTRAVENOUS
  Filled 2021-12-16 (×3): qty 20
  Filled 2021-12-16: qty 100

## 2021-12-16 MED ORDER — GABAPENTIN 100 MG PO CAPS
100.0000 mg | ORAL_CAPSULE | Freq: Three times a day (TID) | ORAL | Status: DC
  Administered 2021-12-16 – 2021-12-19 (×10): 100 mg via ORAL
  Filled 2021-12-16 (×10): qty 1

## 2021-12-16 MED ORDER — AMLODIPINE BESYLATE 5 MG PO TABS
5.0000 mg | ORAL_TABLET | Freq: Every day | ORAL | Status: DC
  Administered 2021-12-16 – 2021-12-19 (×4): 5 mg via ORAL
  Filled 2021-12-16 (×4): qty 1

## 2021-12-16 MED ORDER — ATORVASTATIN CALCIUM 10 MG PO TABS
10.0000 mg | ORAL_TABLET | Freq: Every day | ORAL | Status: DC
  Administered 2021-12-16 – 2021-12-18 (×3): 10 mg via ORAL
  Filled 2021-12-16 (×3): qty 1

## 2021-12-16 MED ORDER — HYDRALAZINE HCL 25 MG PO TABS
25.0000 mg | ORAL_TABLET | Freq: Three times a day (TID) | ORAL | Status: DC
  Administered 2021-12-16 – 2021-12-19 (×9): 25 mg via ORAL
  Filled 2021-12-16 (×9): qty 1

## 2021-12-16 MED ORDER — DEXTROSE 50 % IV SOLN
1.0000 | Freq: Once | INTRAVENOUS | Status: AC | PRN
  Administered 2021-12-16: 50 mL via INTRAVENOUS
  Filled 2021-12-16: qty 50

## 2021-12-16 MED ORDER — MAGNESIUM SULFATE 2 GM/50ML IV SOLN
2.0000 g | Freq: Once | INTRAVENOUS | Status: AC
  Administered 2021-12-16: 2 g via INTRAVENOUS
  Filled 2021-12-16: qty 50

## 2021-12-16 MED ORDER — PANTOPRAZOLE SODIUM 40 MG PO TBEC
40.0000 mg | DELAYED_RELEASE_TABLET | Freq: Every day | ORAL | Status: DC
  Administered 2021-12-16 – 2021-12-19 (×4): 40 mg via ORAL
  Filled 2021-12-16 (×4): qty 1

## 2021-12-16 MED ORDER — SODIUM CHLORIDE 0.9 % IV SOLN
2.0000 g | Freq: Three times a day (TID) | INTRAVENOUS | Status: DC
  Administered 2021-12-16 – 2021-12-17 (×4): 2 g via INTRAVENOUS
  Filled 2021-12-16 (×4): qty 2

## 2021-12-16 MED ORDER — ALBUTEROL SULFATE (2.5 MG/3ML) 0.083% IN NEBU: 2.5000 mg | INHALATION_SOLUTION | RESPIRATORY_TRACT | Status: DC | PRN

## 2021-12-16 MED ORDER — METHYLPREDNISOLONE SODIUM SUCC 125 MG IJ SOLR
125.0000 mg | Freq: Every day | INTRAMUSCULAR | Status: DC
  Administered 2021-12-16 – 2021-12-17 (×2): 125 mg via INTRAVENOUS
  Filled 2021-12-16 (×2): qty 2

## 2021-12-16 MED ORDER — HEPARIN SODIUM (PORCINE) 5000 UNIT/ML IJ SOLN
5000.0000 [IU] | Freq: Three times a day (TID) | INTRAMUSCULAR | Status: DC
  Administered 2021-12-16 – 2021-12-18 (×7): 5000 [IU] via SUBCUTANEOUS
  Filled 2021-12-16 (×9): qty 1

## 2021-12-16 MED ORDER — ONDANSETRON HCL 4 MG/2ML IJ SOLN: 4.0000 mg | Freq: Four times a day (QID) | INTRAMUSCULAR | Status: DC | PRN

## 2021-12-16 MED ORDER — ACETAMINOPHEN 325 MG PO TABS: 650.0000 mg | ORAL_TABLET | Freq: Four times a day (QID) | ORAL | Status: DC | PRN

## 2021-12-16 MED ORDER — DEXTROSE 50 % IV SOLN: 1.0000 | INTRAVENOUS | Status: AC

## 2021-12-16 MED ORDER — ONDANSETRON HCL 4 MG PO TABS: 4.0000 mg | ORAL_TABLET | Freq: Four times a day (QID) | ORAL | Status: DC | PRN

## 2021-12-16 MED ORDER — ALTEPLASE 2 MG IJ SOLR
2.0000 mg | Freq: Once | INTRAMUSCULAR | Status: AC
  Administered 2021-12-16: 2 mg
  Filled 2021-12-16 (×2): qty 2

## 2021-12-16 MED ORDER — SODIUM CHLORIDE 0.9 % IV SOLN
100.0000 mg | INTRAVENOUS | Status: AC
  Administered 2021-12-16 (×2): 100 mg via INTRAVENOUS
  Filled 2021-12-16: qty 100
  Filled 2021-12-16: qty 20

## 2021-12-16 MED ORDER — OXYCODONE HCL 5 MG PO TABS: 5.0000 mg | ORAL_TABLET | ORAL | Status: DC | PRN

## 2021-12-16 MED ORDER — HYDROXYZINE HCL 10 MG PO TABS
10.0000 mg | ORAL_TABLET | Freq: Three times a day (TID) | ORAL | Status: DC | PRN
  Filled 2021-12-16: qty 1

## 2021-12-16 MED ORDER — POTASSIUM CHLORIDE 10 MEQ/100ML IV SOLN
10.0000 meq | INTRAVENOUS | Status: AC
  Administered 2021-12-16 (×5): 10 meq via INTRAVENOUS
  Filled 2021-12-16 (×5): qty 100

## 2021-12-16 MED ORDER — SERTRALINE HCL 50 MG PO TABS
150.0000 mg | ORAL_TABLET | Freq: Every day | ORAL | Status: DC
  Administered 2021-12-16 – 2021-12-19 (×4): 150 mg via ORAL
  Filled 2021-12-16 (×4): qty 3

## 2021-12-16 MED ORDER — ENSURE ENLIVE PO LIQD
237.0000 mL | Freq: Two times a day (BID) | ORAL | Status: DC
  Administered 2021-12-17: 237 mL via ORAL

## 2021-12-16 NOTE — Progress Notes (Signed)
°  Transition of Care [facility]) Screening Note   Patient Details  Name: [name] Date of Birth: Jul 26, 1961   Transition of Care [facility]) CM/SW Contact:    Boneta Lucks, RN Phone Number: 12/16/2021, 2:47 PM  Waiting on [facility] to update me on bed days and bed status. Patient may need longer or LTC.   Transition of Care Department [facility]) has reviewed patient and no TOC needs have been identified at this time. We will continue to monitor patient advancement through interdisciplinary progression rounds. If new patient transition needs arise, please place a TOC consult.

## 2021-12-16 NOTE — Progress Notes (Signed)
Tamar Miano  is a 61 y.o. female, with history of COPD, depression, diabetes mellitus type 2, diastolic dysfunction, hypertension, and more presents to ED with a chief complaint of altered mental status.  Patient was noted to be severely hypoglycemic at her SNF.Marland Kitchen  She is also noted to have a new opacification to her right upper lobe suggestive of multifocal pneumonia despite having been on vancomycin, Rocephin, as well as doxycycline for ongoing treatment of her left lower extremity osteomyelitis.  She was admitted with acute metabolic encephalopathy in the setting of hypoglycemia and was also noted to have sepsis secondary to possible HCAP on admission.  She was also noted to have multiple electrolyte abnormalities.  -Her overall confusion is improving and she is nearing her usual baseline according to family at bedside.  She has had some progressive confusion over the last several months which may be related to cognitive impairment/dementia which may be developing as she is noted to have atrophy on her head CT.  Blood glucose levels are better controlled at this time and she remains on IV antibiotics as ordered.  CT chest will be obtained to further clarify the imaging noted as it is highly unusual for her to develop this pneumonia in the setting of ongoing IV antibiotic use.  COVID treatment has been changed to remdesivir and baricitinib discontinued.  Continue to follow a.m. labs.  Discussed with son and husband at bedside 1/11.  CODE STATUS changed to DNR after further conversation.  Total care time: 40 minutes.

## 2021-12-16 NOTE — Progress Notes (Signed)
Initial Nutrition Assessment  DOCUMENTATION CODES:   Morbid obesity  INTERVENTION:  Ensure Enlive BID daily    NUTRITION DIAGNOSIS:   Increased nutrient needs related to acute illness (Sepsis, COVID PNA, osteomyelitis) as evidenced by estimated needs.   GOAL:  Provide needs based on ASPEN/SCCM guidelines   MONITOR:  PO intake, Supplement acceptance, Labs, I & O's, Weight trends  REASON FOR ASSESSMENT:   Malnutrition Screening Tool    ASSESSMENT: Patient is a 61 yo obese female who has a hx of DM2, COPD, HTN and depression. She presents from SNF with altered mental status.  Acute metabolic encephalopathy, sepsis, COVID-positive, osteomyelitis left foot. Requiring BiPAP initially now on 4 L nasal canula per RRT.   Patient reports appetite as fair. Able to feed herself. PO: 50% of breakfast and lunch today pt. She drinks Glucerna Shakes daily. Increased protein/energy needs related to osteomyelitis and COVID-virus.  Severe weight gain per records. Weight on 11/06/21- 136.1 kg and current weight based white board in room 133.3 kg.   Patient bed bound since discharging to SNF. Expect lower extremity muscle loss associated with disuse.   Medications: lipitor, protonix, remdesivir.  Antibiotics- Vancomycin, Flagyl and Maxipime  Labs: A1C-9.9% BMP Latest Ref Rng & Units 12/16/2021 12/15/2021 11/27/2021  Glucose 70 - 99 mg/dL 110(H) <20(LL) 141(H)  BUN 6 - 20 mg/dL 19 17 17   Creatinine 0.44 - 1.00 mg/dL 1.30(H) 1.21(H) 1.38(H)  Sodium 135 - 145 mmol/L 136 136 141  Potassium 3.5 - 5.1 mmol/L 3.6 2.9(L) 3.7  Chloride 98 - 111 mmol/L 96(L) 94(L) 100  CO2 22 - 32 mmol/L 33(H) 33(H) 32  Calcium 8.9 - 10.3 mg/dL 7.7(L) 7.9(L) 8.7(L)     NUTRITION - FOCUSED PHYSICAL EXAM:  Flowsheet Row Most Recent Value  Orbital Region No depletion  Upper Arm Region No depletion  Thoracic and Lumbar Region No depletion  Buccal Region No depletion  Temple Region No depletion  Clavicle Bone  Region No depletion  Clavicle and Acromion Bone Region No depletion  Scapular Bone Region No depletion  Dorsal Hand No depletion  Edema (RD Assessment) Moderate  Hair Reviewed  Eyes Reviewed  Mouth Reviewed  Skin Reviewed  Nails Reviewed       Diet Order:   Diet Order             Diet heart healthy/carb modified Room service appropriate? Yes; Fluid consistency: Thin  Diet effective now                   EDUCATION NEEDS:  Education needs have been addressed  Skin:  Skin Assessment: Skin Integrity Issues: Skin Integrity Issues:: Unstageable, Other (Comment) Unstageable: right knee Other: recent history of multiple areas of skin breakdown noted -per nurisng on 11/22/21  Last BM:  prior to admission  Height:   Ht Readings from Last 1 Encounters:  12/15/21 5\' 1"  (1.549 m)    Weight:   Wt Readings from Last 1 Encounters:  12/15/21 (!) 177.4 kg  According to pt room wt on 12/16/21 - 133.3 kg- this wt used to calculate nutrition needs  Ideal Body Weight:   48 kg  BMI:  Body mass index is 73.9 kg/m.  Estimated Nutritional Needs:   Kcal:  2100-2200  Protein:  110-120 gr  Fluid:  2 liters daily   Colman Cater MS,RD,CSG,LDN Contact: Shea Evans

## 2021-12-16 NOTE — Progress Notes (Signed)
Patient taken off of BIPAP by RT and placed on 4L nasal cannula. Sats are 97% and patient is tolerating well. BIPAP at bedside if needed. Will continue to monitor.

## 2021-12-16 NOTE — Progress Notes (Signed)
Hypoglycemic Event  CBG: 57  Treatment: D50 50 mL (25 gm)  Symptoms: None  Follow-up CBG: Time:0245 CBG Result:134  Possible Reasons for Event: Unknown  Comments/MD notified:Asia Orlin Hilding DO [place]

## 2021-12-16 NOTE — H&P (Signed)
TRH H&P    Patient Demographics:    [name], is a 61 y.o. female  MRN: [id number]  DOB - [birth year]  Admit Date - 12/15/2021  Referring MD/NP/PA: Doren Custard  Outpatient Primary MD for the patient is [name], [name], NP  Patient coming from: [place]  Chief complaint- altered mental status   HPI:    [name]  is a 61 y.o. female, with history of COPD, depression, diabetes mellitus type 2, diastolic dysfunction, hypertension, and more presents to ED with a chief complaint of altered mental status.  Unfortunately patient is not able to provide any history.  It is reported that staff at the nursing home found her unresponsive, and sent her to the ED.  Checked her sugar at the ED which was less than 10.  Patient was given 2 A of D50 and started to perk up, but remained confused.  She is a diabetic and she takes Horticulturist, commercial and NovoLog at her facility.  She takes 100 units of Semglee and 12 units of NovoLog with meals 3 times a day.  It is unknown when she last had these medications.  After admission glucose dropped again and she was given another amp of D50.  Her blood pressure is also been high with a diastolic being the main problem of 210.  Initially in the ED it was thought that she might be in flash pulmonary edema so they started her on BiPAP and nitro drip.  The nitro drip is not 5% dextrose solution also help the sugar.  May need to add D10 if glucose continues to drop.  In the ED Temp 97.6, after admission temp did drop down to 95-warming like it started, heart rate 65-70, respiratory rate 20-26, blood pressure as high as 170/110 VBG was done that showed a pH of 7.39 and a PCO2 of 57.6 Patient remains on BiPAP Patient is a borderline leukopenia with white blood cell count of 4.9, hemoglobin 12.5, platelets 263 Chemistry panel reveals a hypokalemia at 2.9 and an undetectable glucose Hypocalcemia  7.9 Hypomagnesemia 1.5 Hypoalbuminemia 2.4 Chest x-ray shows complete opacification of the right upper lobe with findings suggestive of multifocal pneumonia Blood culture pending Case is complicated by the fact the patient has been on vancomycin and doxycycline, as well as Rocephin in the outpatient setting for osteomyelitis.  The last days of these antibiotics are recorded as January 16 and January 30.  Last dose of these antibiotics is unknown. Patient was started on vancomycin, cefepime in the ED, and Flagyl was added at admission Osteomyelitis was of the left lower extremity, will repeat MRI of left foot Admission requested for further work-up of hypoglycemia, acute metabolic encephalopathy, HCAP, and hypertensive crisis    Review of systems:    Unfortunately patient is not able to provide review of systems secondary to altered mental status   Past History of the following :    Past Medical History:  Diagnosis Date   Asthma    COPD (chronic obstructive pulmonary disease) ([place])    Depression    Diabetes  mellitus    Diastolic dysfunction 06/11/8241   Hypertension    Prolonged QT interval 05/14/2015      Past Surgical History:  Procedure Laterality Date   CATARACT EXTRACTION     CESAREAN SECTION     CHOLECYSTECTOMY        Social History:      Social History   Tobacco Use   Smoking status: Passive Smoke Exposure - Never Smoker   Smokeless tobacco: Never  Substance Use Topics   Alcohol use: No    Alcohol/week: 0.0 standard drinks       Family History :     Family History  Problem Relation Age of Onset   Stroke Mother    Diabetes Father    Heart failure Father    Hypertension Father    Diabetes Sister    Heart failure Sister    Hypertension Sister    Stroke Sister    Cancer Other    Stroke Sister       Home Medications:   Prior to Admission medications   Medication Sig Start Date End Date Taking? Authorizing Provider  albuterol (VENTOLIN HFA) 108  (90 Base) MCG/ACT inhaler Inhale 2 puffs into the lungs every 6 (six) hours as needed for wheezing or shortness of breath.    [provider]  amLODipine (NORVASC) 5 MG tablet Take 1 tablet (5 mg total) by mouth daily. 11/27/21   Johnson, Clanford L, MD  amoxicillin-clavulanate (AUGMENTIN) 875-125 MG tablet Take 1 tablet by mouth 2 (two) times daily for 14 days. 12/21/21 01/04/22  Johnson, Clanford L, MD  atorvastatin (LIPITOR) 10 MG tablet Take 10 mg by mouth at bedtime. 09/29/21   [provider]  cefTRIAXone (ROCEPHIN) IVPB Inject 2 g into the vein daily for 24 days. Indication:  Diabetic foot infection/Osteomyelitis left foot First Dose: Yes Last Day of Therapy:  12/20/2021 Labs - Once weekly:  CBC/D and BMP, Labs - Every other week:  ESR and CRP Method of administration: IV Push Method of administration may be changed at the discretion of home infusion pharmacist based upon assessment of the patient and/or caregiver's ability to self-administer the medication ordered. 11/27/21 12/21/21  Murlean Iba, MD  doxycycline (VIBRAMYCIN) 100 MG capsule Take 1 capsule (100 mg total) by mouth 2 (two) times daily for 14 days. 12/21/21 01/04/22  Johnson, Clanford L, MD  esomeprazole (NEXIUM) 20 MG capsule Take 20 mg by mouth 2 (two) times daily before a meal.    [provider]  gabapentin (NEURONTIN) 100 MG capsule Take 1 capsule (100 mg total) by mouth 3 (three) times daily. 11/26/21   Johnson, Clanford L, MD  hydrALAZINE (APRESOLINE) 25 MG tablet Take 1 tablet (25 mg total) by mouth every 8 (eight) hours. 11/26/21   Johnson, Clanford L, MD  hydrOXYzine (ATARAX/VISTARIL) 10 MG tablet Take 10 mg by mouth every 8 (eight) hours as needed for itching or anxiety.  11/22/19   [provider]  insulin aspart (NOVOLOG) 100 UNIT/ML injection Inject 12 Units into the skin 3 (three) times daily with meals. Give only if eats 50% or more of meal. 11/26/21   Johnson, Clanford L, MD   insulin glargine-yfgn (SEMGLEE) 100 UNIT/ML injection Inject 0.35 mLs (35 Units total) into the skin at bedtime. 11/26/21   Johnson, Clanford L, MD  magnesium oxide (MAG-OX) 400 (240 Mg) MG tablet Take 1 tablet by mouth daily. 11/07/21   [provider]  Nystatin (GERHARDT'S BUTT CREAM) CREA As directed 11/26/21  Johnson, Clanford L, MD  nystatin (MYCOSTATIN/NYSTOP) powder Apply topically 2 (two) times daily. 11/26/21   Johnson, Clanford L, MD  potassium chloride SA (K-DUR) 20 MEQ tablet Take 20 mEq by mouth 2 (two) times daily.    [provider]  propranolol ER (INDERAL LA) 80 MG 24 hr capsule Take 80 mg by mouth daily. 12/28/19   [provider]  saccharomyces boulardii (FLORASTOR) 250 MG capsule Take 1 capsule (250 mg total) by mouth 2 (two) times daily. 11/26/21 02/24/22  Johnson, Clanford L, MD  sertraline (ZOLOFT) 50 MG tablet Take 3 tablets (150 mg total) by mouth daily. 11/27/21   Johnson, Clanford L, MD  torsemide (DEMADEX) 20 MG tablet Take 20 mg by mouth 2 (two) times daily.  03/28/18   [provider]  vancomycin IVPB Inject 1,000 mg into the vein daily for 25 days. Indication:  Diabetic foot infection/Osteomyelitis left foot First Dose: Yes Last Day of Therapy:  12/20/2021 Labs - Sunday/Monday:  CBC/D, BMP, and vancomycin trough. Labs - Thursday:  BMP and vancomycin trough Labs - Every other week:  ESR and CRP Method of administration:Elastomeric Method of administration may be changed at the discretion of the patient and/or caregiver's ability to self-administer the medication ordered. 11/26/21 12/21/21  Murlean Iba, MD     Allergies:     Allergies  Allergen Reactions   Carvedilol Palpitations   Benicar [Olmesartan] Swelling   Codeine Other (See Comments)    Confusion    Sulfa Antibiotics Swelling    Whole face swells   Trulicity [Dulaglutide] Diarrhea     Physical Exam:   Vitals  Blood pressure 132/66, pulse (!) 54,  temperature (!) 96.8 F (36 C), temperature source Rectal, resp. rate 16, height _0  (1.549 m), weight (!) 177.4 kg, last menstrual period 08/06/2012, SpO2 95 %.   1.  General: Patient lying supine in bed,  no acute distress   2. Psychiatric: Somnolent, following commands but not answering questions,    3. Neurologic: Patient is somnolent, following simple commands but not answering questions, moves all 4 extremities voluntarily  4. HEENMT:  Head is atraumatic, normocephalic, pupils reactive to light, neck is supple, trachea is midline, mucous membranes are moist   5. Respiratory : Lungs are clear to auscultation bilaterally without wheezing, rhonchi, rales, no cyanosis, no increase in work of breathing or accessory muscle use, currently on BiPAP   6. Cardiovascular : Heart rate normal, rhythm is regular, no murmurs, rubs or gallops, peripheral pulses palpated   7. Gastrointestinal:  Abdomen is taut, but not rigid, no guarding,  nondistended, nontender to palpation bowel sounds active, no masses or organomegaly palpated   8. Skin:  Great left toe is erythematous, right lower extremity has healing pressure injury, unstageable pressure injury on left buttock, nursing staff who knows her from last admission reports that these injuries are healing compared to previous presentation   9.Musculoskeletal:  No acute deformities or trauma, no asymmetry in tone, no peripheral edema, peripheral pulses palpated, no tenderness to palpation in the extremities     Data Review:    CBC Recent Labs  Lab 12/15/21 2226  WBC 4.9  HGB 12.5  HCT 39.4  PLT 263  MCV 80.2  MCH 25.5*  MCHC 31.7  RDW 15.9*  LYMPHSABS 0.5*  MONOABS 0.3  EOSABS 0.4  BASOSABS 0.0   ------------------------------------------------------------------------------------------------------------------  Results for orders placed or performed during the hospital encounter of 12/15/21 (from the past 48 hour(s))   Lactic  acid, plasma     Status: None   Collection Time: 12/15/21 10:26 PM  Result Value Ref Range   Lactic Acid, Venous 0.7 0.5 - 1.9 mmol/L    Comment: Performed at [facility], [address] [place], [place] [address]  Comprehensive metabolic panel     Status: Abnormal   Collection Time: 12/15/21 10:26 PM  Result Value Ref Range   Sodium 136 135 - 145 mmol/L   Potassium 2.9 (L) 3.5 - 5.1 mmol/L   Chloride 94 (L) 98 - 111 mmol/L   CO2 33 (H) 22 - 32 mmol/L   Glucose, Bld <20 (LL) 70 - 99 mg/dL    Comment: CRITICAL RESULT CALLED TO, READ BACK BY AND VERIFIED WITH: Merrick,L_0  by matthews, b 1.11.23    BUN 17 6 - 20 mg/dL   Creatinine, Ser 1.21 (H) 0.44 - 1.00 mg/dL   Calcium 7.9 (L) 8.9 - 10.3 mg/dL   Total Protein 6.7 6.5 - 8.1 g/dL   Albumin 2.4 (L) 3.5 - 5.0 g/dL   AST 25 15 - 41 U/L   ALT 17 0 - 44 U/L   Alkaline Phosphatase 127 (H) 38 - 126 U/L   Total Bilirubin 0.2 (L) 0.3 - 1.2 mg/dL   GFR, Estimated 51 (L) >60 mL/min    Comment: (NOTE) Calculated using the CKD-EPI Creatinine Equation (2021)    Anion gap 9 5 - 15    Comment: Performed at [facility], [address], [place], [place] [address]  CBC WITH DIFFERENTIAL     Status: Abnormal   Collection Time: 12/15/21 10:26 PM  Result Value Ref Range   WBC 4.9 4.0 - 10.5 K/uL   RBC 4.91 3.87 - 5.11 MIL/uL   Hemoglobin 12.5 12.0 - 15.0 g/dL   HCT 39.4 36.0 - 46.0 %   MCV 80.2 80.0 - 100.0 fL   MCH 25.5 (L) 26.0 - 34.0 pg   MCHC 31.7 30.0 - 36.0 g/dL   RDW 15.9 (H) 11.5 - 15.5 %   Platelets 263 150 - 400 K/uL   nRBC 0.0 0.0 - 0.2 %   Neutrophils Relative % 78 %   Neutro Abs 3.8 1.7 - 7.7 K/uL   Lymphocytes Relative 9 %   Lymphs Abs 0.5 (L) 0.7 - 4.0 K/uL   Monocytes Relative 5 %   Monocytes Absolute 0.3 0.1 - 1.0 K/uL   Eosinophils Relative 7 %   Eosinophils Absolute 0.4 0.0 - 0.5 K/uL   Basophils Relative 1 %   Basophils Absolute 0.0 0.0 - 0.1 K/uL   Immature Granulocytes 0 %   Abs Immature Granulocytes  0.02 0.00 - 0.07 K/uL    Comment: Performed at [facility], [address] [place], [place] [address]  Protime-INR     Status: None   Collection Time: 12/15/21 10:26 PM  Result Value Ref Range   Prothrombin Time 12.9 11.4 - 15.2 seconds   INR 1.0 0.8 - 1.2    Comment: (NOTE) INR goal varies based on device and disease states. Performed at [facility], [address] [place], [place] [address]   Magnesium     Status: Abnormal   Collection Time: 12/15/21 10:26 PM  Result Value Ref Range   Magnesium 1.5 (L) 1.7 - 2.4 mg/dL    Comment: Performed at [facility], [address], [place], [place] [address]  Blood culture (routine x 2)     Status: None (Preliminary result)   Collection Time: 12/15/21 10:32 PM   Specimen:  BLOOD RIGHT FOREARM  Result Value Ref Range   Specimen Description BLOOD RIGHT FOREARM    Special Requests      BOTTLES DRAWN AEROBIC AND ANAEROBIC Blood Culture adequate volume Performed at [facility], [address], [place], [place] [address]    Culture PENDING    Report Status PENDING   CBG monitoring, ED     Status: Abnormal   Collection Time: 12/15/21 10:34 PM  Result Value Ref Range   Glucose-Capillary <10 (LL) 70 - 99 mg/dL    Comment: Glucose reference range applies only to samples taken after fasting for at least 8 hours.   Comment 1 Notify RN   CBG monitoring, ED     Status: Abnormal   Collection Time: 12/15/21 10:44 PM  Result Value Ref Range   Glucose-Capillary 227 (H) 70 - 99 mg/dL    Comment: Glucose reference range applies only to samples taken after fasting for at least 8 hours.  Blood gas, arterial (at [facility])     Status: Abnormal   Collection Time: 12/15/21 11:00 PM  Result Value Ref Range   FIO2 100.00    pH, Arterial 7.392 7.350 - 7.450   pCO2 arterial 57.6 (H) 32.0 - 48.0 mmHg   pO2, Arterial 212 (H) 83.0 - 108.0 mmHg   Bicarbonate 31.9 (H) 20.0 - 28.0 mmol/L   Acid-Base Excess 9.3 (H) 0.0 - 2.0 mmol/L   O2 Saturation 98.9 %   Patient  temperature 36.5    Collection site RIGHT RADIAL    Drawn by [id number]    Sample type ARTERIAL    Allens test (pass/fail) PASS PASS    Comment: Performed at [facility], [address], [place], [place] [address]  CBG monitoring, ED     Status: Abnormal   Collection Time: 12/15/21 11:15 PM  Result Value Ref Range   Glucose-Capillary 126 (H) 70 - 99 mg/dL    Comment: Glucose reference range applies only to samples taken after fasting for at least 8 hours.  Blood culture (routine x 2)     Status: None (Preliminary result)   Collection Time: 12/15/21 11:21 PM   Specimen: Vein; Blood  Result Value Ref Range   Specimen Description BLOOD LEFT HAND    Special Requests      BOTTLES DRAWN AEROBIC AND ANAEROBIC Blood Culture adequate volume Performed at [facility], [address] [place], [place] [address]    Culture PENDING    Report Status PENDING   CBG monitoring, ED     Status: None   Collection Time: 12/16/21 12:02 AM  Result Value Ref Range   Glucose-Capillary 85 70 - 99 mg/dL    Comment: Glucose reference range applies only to samples taken after fasting for at least 8 hours.  Resp Panel by RT-PCR (Flu A&B, Covid) Nasopharyngeal Swab     Status: Abnormal   Collection Time: 12/16/21 12:20 AM   Specimen: Nasopharyngeal Swab; Nasopharyngeal(NP) swabs in vial transport medium  Result Value Ref Range   SARS Coronavirus 2 by RT PCR POSITIVE (A) NEGATIVE    Comment: (NOTE) SARS-CoV-2 target nucleic acids are DETECTED.  The SARS-CoV-2 RNA is generally detectable in upper respiratory specimens during the acute phase of infection. Positive results are indicative of the presence of the identified virus, but do not rule out bacterial infection or co-infection with other pathogens not detected by the test. Clinical correlation with patient history and other diagnostic information is necessary to determine patient infection status. The expected result  is Negative.  Fact Sheet for  Patients: EntrepreneurPulse.com.au  Fact Sheet for Healthcare Providers: IncredibleEmployment.be  This test is not yet approved or cleared by the [place] FDA and  has been authorized for detection and/or diagnosis of SARS-CoV-2 by FDA under an Emergency Use Authorization (EUA).  This EUA will remain in effect (meaning this test can be used) for the duration of  the COVID-19 declaration under Section 564(b)(1) of the A ct, 21 U.S.C. section 360bbb-3(b)(1), unless the authorization is terminated or revoked sooner.     Influenza A by PCR NEGATIVE NEGATIVE   Influenza B by PCR NEGATIVE NEGATIVE    Comment: (NOTE) The Xpert Xpress SARS-CoV-2/FLU/RSV plus assay is intended as an aid in the diagnosis of influenza from Nasopharyngeal swab specimens and should not be used as a sole basis for treatment. Nasal washings and aspirates are unacceptable for Xpert Xpress SARS-CoV-2/FLU/RSV testing.  Fact Sheet for Patients: EntrepreneurPulse.com.au  Fact Sheet for Healthcare Providers: IncredibleEmployment.be  This test is not yet approved or cleared by the [place] FDA and has been authorized for detection and/or diagnosis of SARS-CoV-2 by FDA under an Emergency Use Authorization (EUA). This EUA will remain in effect (meaning this test can be used) for the duration of the COVID-19 declaration under Section 564(b)(1) of the Act, 21 U.S.C. section 360bbb-3(b)(1), unless the authorization is terminated or revoked.  Performed at [facility], [address] [place], [place] [address]   Lactic acid, plasma     Status: None   Collection Time: 12/16/21  1:03 AM  Result Value Ref Range   Lactic Acid, Venous 0.9 0.5 - 1.9 mmol/L    Comment: Performed at [facility], [address] [place], [place] [address]  Glucose, capillary     Status: Abnormal   Collection Time: 12/16/21  2:22 AM  Result Value Ref Range    Glucose-Capillary 57 (L) 70 - 99 mg/dL    Comment: Glucose reference range applies only to samples taken after fasting for at least 8 hours.  Glucose, capillary     Status: Abnormal   Collection Time: 12/16/21  2:45 AM  Result Value Ref Range   Glucose-Capillary 134 (H) 70 - 99 mg/dL    Comment: Glucose reference range applies only to samples taken after fasting for at least 8 hours.  Blood gas, venous     Status: Abnormal   Collection Time: 12/16/21  4:13 AM  Result Value Ref Range   FIO2 40.00    pH, Ven 7.379 7.250 - 7.430   pCO2, Ven 57.0 44.0 - 60.0 mmHg   pO2, Ven 55.4 (H) 32.0 - 45.0 mmHg   Bicarbonate 30.2 (H) 20.0 - 28.0 mmol/L   Acid-Base Excess 7.9 (H) 0.0 - 2.0 mmol/L   O2 Saturation 87.6 %   Patient temperature 36.0    Collection site BLOOD RIGHT FOREARM    Drawn by [facility]    Sample type VENOUS     Comment: Performed at [facility], [address], [place], [place] [address]    Chemistries  Recent Labs  Lab 12/15/21 2226  NA 136  K 2.9*  CL 94*  CO2 33*  GLUCOSE <20*  BUN 17  CREATININE 1.21*  CALCIUM 7.9*  MG 1.5*  AST 25  ALT 17  ALKPHOS 127*  BILITOT 0.2*   ------------------------------------------------------------------------------------------------------------------  ------------------------------------------------------------------------------------------------------------------ GFR: Estimated Creatinine Clearance: 77.7 mL/min (A) (by C-G formula based on SCr of 1.21 mg/dL (H)). Liver Function Tests: Recent Labs  Lab 12/15/21 2226  AST 25  ALT 17  ALKPHOS 127*  BILITOT 0.2*  PROT 6.7  ALBUMIN 2.4*   No results for input(s): LIPASE, AMYLASE in the last 168 hours. No results for input(s): AMMONIA in the last 168 hours. Coagulation Profile: Recent Labs  Lab 12/15/21 2226  INR 1.0   Cardiac Enzymes: No results for input(s): CKTOTAL, CKMB, CKMBINDEX, TROPONINI in the last 168 hours. BNP (last 3 results) No results for input(s):  PROBNP in the last 8760 hours. HbA1C: No results for input(s): HGBA1C in the last 72 hours. CBG: Recent Labs  Lab 12/15/21 2244 12/15/21 2315 12/16/21 0002 12/16/21 0222 12/16/21 0245  GLUCAP 227* 126* 85 57* 134*   Lipid Profile: No results for input(s): CHOL, HDL, LDLCALC, TRIG, CHOLHDL, LDLDIRECT in the last 72 hours. Thyroid Function Tests: No results for input(s): TSH, T4TOTAL, FREET4, T3FREE, THYROIDAB in the last 72 hours. Anemia Panel: No results for input(s): VITAMINB12, FOLATE, FERRITIN, TIBC, IRON, RETICCTPCT in the last 72 hours.  --------------------------------------------------------------------------------------------------------------- Urine analysis:    Component Value Date/Time   COLORURINE AMBER (A) 11/21/2021 1626   APPEARANCEUR CLOUDY (A) 11/21/2021 1626   LABSPEC 1.016 11/21/2021 1626   PHURINE 5.0 11/21/2021 1626   GLUCOSEU 50 (A) 11/21/2021 1626   HGBUR LARGE (A) 11/21/2021 1626   BILIRUBINUR NEGATIVE 11/21/2021 1626   KETONESUR 5 (A) 11/21/2021 1626   PROTEINUR >=300 (A) 11/21/2021 1626   UROBILINOGEN 0.2 05/15/2015 0112   NITRITE NEGATIVE 11/21/2021 1626   LEUKOCYTESUR NEGATIVE 11/21/2021 1626      Imaging Results:    CT Head Wo Contrast  Result Date: 12/16/2021 CLINICAL DATA:  Mental status changes. EXAM: CT HEAD WITHOUT CONTRAST TECHNIQUE: Contiguous axial images were obtained from the base of the skull through the vertex without intravenous contrast. RADIATION DOSE REDUCTION: This exam was performed according to the departmental dose-optimization program which includes automated exposure control, adjustment of the mA and/or kV according to patient size and/or use of iterative reconstruction technique. COMPARISON:  11/26/2021 FINDINGS: Brain: There is atrophy and chronic small vessel disease changes. No acute intracranial abnormality. Specifically, no hemorrhage, hydrocephalus, mass lesion, acute infarction, or significant intracranial injury.  Vascular: No hyperdense vessel or unexpected calcification. Skull: No acute calvarial abnormality. Sinuses/Orbits: Mucosal thickening throughout the paranasal sinuses. No air-fluid levels. Other: None IMPRESSION: Atrophy, chronic microvascular disease. No acute intracranial abnormality. Electronically Signed   By: Rolm Baptise M.D.   On: 12/16/2021 01:42   DG Chest Portable 1 View  Result Date: 12/15/2021 CLINICAL DATA:  sob EXAM: PORTABLE CHEST 1 VIEW COMPARISON:  Chest x-ray 11/26/2021, CT chest 09/01/2008 FINDINGS: Enlarged cardiac silhouette. The heart and mediastinal contours are unchanged. Complete opacification of the right upper lobe. Patchy airspace opacity within the right middle and lower lobes. No pulmonary edema. Likely trace bilateral pleural effusions. No pneumothorax. No acute osseous abnormality. IMPRESSION: 1. Complete opacification of the right upper lobe. Patchy airspace opacity within the right middle and lower lobes. Findings suggestive of multifocal pneumonia. Followup PA and lateral chest X-ray is recommended in 3-4 weeks following therapy to ensure resolution. 2. Likely trace bilateral pleural effusions. Electronically Signed   By: Iven Finn M.D.   On: 12/15/2021 22:55       Assessment & Plan:    Principal Problem:   Acute metabolic encephalopathy   Acute metabolic encephalopathy Multifactorial secondary to sepsis and hypoglycemia VBG initially showed a normal pH 7.39, and chronic retention with a CO2 of 57 Repeat VBG this a.m. Continue to treat sepsis and  mental status should improve Continue to monitor Sepsis secondary to HCAP Patient has pneumonia while having been on vancomycin, Rocephin, doxycycline in the outpatient setting Continue cefepime, vancomycin and Flagyl Blood cultures pending SIRS criteria hypothermia, tachypnea, acute metabolic encephalopathy Most likely source of infection is pneumonia, but osteomyelitis likely also contributing Life  threatening organ dysfunction 2/2 infection is evidenced by: Acute metabolic encephalopathy Antibiotics started vancomycin, cefepime, Flagyl Sepsis order set utilized Monitor this patient on stepdown level of care Update -now also COVID-positive, starting baricitinib Osteomyelitis Patient is being treated with Rocephin, vancomycin, doxycycline in the outpatient setting Continue cefepime, vancomycin, Flagyl Repeat MRI left foot for signs of worsening infection Continue to monitor Hypomagnesemia Replace and recheck Hypokalemia Replace and recheck Hypoglycemia in the setting of type 2 diabetes mellitus Holding insulin Continue nitro with D5 Amps of D50 as needed for hypoglycemia Frequent CBG monitoring Follow in stepdown unit COPD Chronic CO2 retention on initial VBG Repeat VBG Albuterol every 2 hours as needed Continue BiPAP Hypertension Continue nitro drip Continue Norvasc, hydralazine, propanolol COVID-positive Continue steroid Start baricitinib    DVT Prophylaxis-   Heparin - SCDs   AM Labs Ordered, also please review Full Orders  Family Communication: No family at bedside  Code Status: Full  Admission status: Inpatient :The appropriate admission status for this patient is INPATIENT. Inpatient status is judged to be reasonable and necessary in order to provide the required intensity of service to ensure the patient's safety. The patient's presenting symptoms, physical exam findings, and initial radiographic and laboratory data in the context of their chronic comorbidities is felt to place them at high risk for further clinical deterioration. Furthermore, it is not anticipated that the patient will be medically stable for discharge from the hospital within 2 midnights of admission. The following factors support the admission status of inpatient.     The patient's presenting symptoms include altered mental status. The worrisome physical exam findings include hypothermia,  acute metabolic encephalopathy. The initial radiographic and laboratory data are worrisome because of pneumonia, hypokalemia, hypomagnesemia, undetectably low glucose. The chronic co-morbidities include diabetes mellitus type 2, hypertension, hyperlipidemia, anxiety.       * I certify that at the point of admission it is my clinical judgment that the patient will require inpatient hospital care spanning beyond 2 midnights from the point of admission due to high intensity of service, high risk for further deterioration and high frequency of surveillance required.*  Disposition: Anticipated Discharge date 3-4 days discharge to [facility]  Time spent in minutes : [place]

## 2021-12-16 NOTE — Progress Notes (Signed)
Patient transported from CT to ICU 06 via BIPAP. Currently on 14/7 R-18 50%. No distress noted at this time.

## 2021-12-16 NOTE — Progress Notes (Signed)
Pharmacy Antibiotic Note  [name] is a 61 y.o. female admitted on 12/15/2021 with sepsis.  Pharmacy has been consulted for vancomycin and cefepime dosing.  Plan: Vancomycin 2000mg  x1 then 1500mg  IV Q24H. Goal AUC 400-550.  Expected AUC 480.  SCr 1.21.  Cefepime 2g IV Q8H.  Height: 5\' 1"  (154.9 cm) Weight: (!) 177.4 kg (391 lb 1.5 oz) IBW/kg (Calculated) : 47.8  Temp (24hrs), Avg:96.7 F (35.9 C), Min:95.8 F (35.4 C), Max:97.6 F (36.4 C)  Recent Labs  Lab 12/15/21 2226 12/16/21 0103  WBC 4.9  --   CREATININE 1.21*  --   LATICACIDVEN 0.7 0.9    Estimated Creatinine Clearance: 77.7 mL/min (A) (by C-G formula based on SCr of 1.21 mg/dL (H)).    Allergies  Allergen Reactions   Carvedilol Palpitations   Benicar [Olmesartan] Swelling   Codeine Other (See Comments)    Confusion    Sulfa Antibiotics Swelling    Whole face swells   Trulicity [Dulaglutide] Diarrhea    Thank you for allowing pharmacy to be a part of this patients care.  Wynona Neat, PharmD, BCPS  12/16/2021 4:26 AM

## 2021-12-16 NOTE — Progress Notes (Signed)
Inpatient Diabetes Program Recommendations  AACE/ADA: New Consensus Statement on Inpatient Glycemic Control (2015)  Target Ranges:  Prepandial:   less than 140 mg/dL      Peak postprandial:   less than 180 mg/dL (1-2 hours)      Critically ill patients:  140 - 180 mg/dL   Lab Results  Component Value Date   GLUCAP 145 (H) 12/16/2021   HGBA1C 9.9 (H) 11/21/2021    Review of Glycemic Control  Latest Reference Range & Units 12/16/21 02:45 12/16/21 04:14 12/16/21 05:35 12/16/21 06:35 12/16/21 07:49 12/16/21 10:58  Glucose-Capillary 70 - 99 mg/dL 134 (H) 115 (H) 110 (H) 109 (H) 118 (H) 145 (H)  (H): Data is abnormally high Admitted unresponsive + Covid positive  Diabetes history: DM2 Outpatient Diabetes medications: Semglee 35 units qd, Novolog 12 units tid meal coverage Current orders for Inpatient glycemic control: CBGs only  Inpatient Diabetes Program Recommendations:   Noted patient's CBG was <10 on arrival of EMS. Patient was discharged on 11/26/21 on Semglee 40 units qd, Novolog 12 units tid meal coverage.  Continue to check CBGs and add insulin as needed.  Thank you, Nani Gasser. Amillya Chavira, RN, MSN, CDE  Diabetes Coordinator Inpatient Glycemic Control Team Team Pager [phone] (8am-5pm) 12/16/2021 1:18 PM

## 2021-12-17 DIAGNOSIS — G9341 Metabolic encephalopathy: Secondary | ICD-10-CM | POA: Diagnosis not present

## 2021-12-17 LAB — BASIC METABOLIC PANEL
Anion gap: 12 (ref 5–15)
BUN: 26 mg/dL — ABNORMAL HIGH (ref 6–20)
CO2: 28 mmol/L (ref 22–32)
Calcium: 7.8 mg/dL — ABNORMAL LOW (ref 8.9–10.3)
Chloride: 96 mmol/L — ABNORMAL LOW (ref 98–111)
Creatinine, Ser: 1.51 mg/dL — ABNORMAL HIGH (ref 0.44–1.00)
GFR, Estimated: 39 mL/min — ABNORMAL LOW (ref >60)
Glucose, Bld: 313 mg/dL — ABNORMAL HIGH (ref 70–99)
Potassium: 3.5 mmol/L (ref 3.5–5.1)
Sodium: 136 mmol/L (ref 135–145)

## 2021-12-17 LAB — CBC
HCT: 33.3 % — ABNORMAL LOW (ref 36.0–46.0)
Hemoglobin: 10.7 g/dL — ABNORMAL LOW (ref 12.0–15.0)
MCH: 25 pg — ABNORMAL LOW (ref 26.0–34.0)
MCHC: 32.1 g/dL (ref 30.0–36.0)
MCV: 77.8 fL — ABNORMAL LOW (ref 80.0–100.0)
Platelets: 243 10*3/uL (ref 150–400)
RBC: 4.28 MIL/uL (ref 3.87–5.11)
RDW: 15.7 % — ABNORMAL HIGH (ref 11.5–15.5)
WBC: 5 10*3/uL (ref 4.0–10.5)
nRBC: 0 % (ref 0.0–0.2)

## 2021-12-17 LAB — URINALYSIS, ROUTINE W REFLEX MICROSCOPIC
Bilirubin Urine: NEGATIVE
Glucose, UA: 150 mg/dL — AB
Hgb urine dipstick: NEGATIVE
Ketones, ur: 5 mg/dL — AB
Leukocytes,Ua: NEGATIVE
Nitrite: NEGATIVE
Protein, ur: 100 mg/dL — AB
Specific Gravity, Urine: 1.017 (ref 1.005–1.030)
pH: 5 (ref 5.0–8.0)

## 2021-12-17 LAB — GLUCOSE, CAPILLARY
Glucose-Capillary: 311 mg/dL — ABNORMAL HIGH (ref 70–99)
Glucose-Capillary: 391 mg/dL — ABNORMAL HIGH (ref 70–99)
Glucose-Capillary: 413 mg/dL — ABNORMAL HIGH (ref 70–99)
Glucose-Capillary: 367 mg/dL — ABNORMAL HIGH (ref 70–99)
Glucose-Capillary: 321 mg/dL — ABNORMAL HIGH (ref 70–99)

## 2021-12-17 LAB — MAGNESIUM: Magnesium: 1.8 mg/dL (ref 1.7–2.4)

## 2021-12-17 LAB — EXPECTORATED SPUTUM ASSESSMENT W GRAM STAIN, RFLX TO RESP C

## 2021-12-17 MED ORDER — SODIUM CHLORIDE 0.9 % IV SOLN
2.0000 g | Freq: Two times a day (BID) | INTRAVENOUS | Status: DC
  Administered 2021-12-17 – 2021-12-19 (×4): 2 g via INTRAVENOUS
  Filled 2021-12-17 (×4): qty 2

## 2021-12-17 MED ORDER — INSULIN ASPART 100 UNIT/ML IJ SOLN: 0.0000 [IU] | Freq: Every day | INTRAMUSCULAR | Status: DC

## 2021-12-17 MED ORDER — GERHARDT'S BUTT CREAM
1.0000 "application " | TOPICAL_CREAM | Freq: Two times a day (BID) | CUTANEOUS | Status: DC
  Administered 2021-12-17 – 2021-12-19 (×5): 1 via TOPICAL
  Filled 2021-12-17: qty 1

## 2021-12-17 MED ORDER — INSULIN ASPART 100 UNIT/ML IJ SOLN
12.0000 [IU] | Freq: Three times a day (TID) | INTRAMUSCULAR | Status: DC
  Administered 2021-12-17 – 2021-12-19 (×8): 12 [IU] via SUBCUTANEOUS

## 2021-12-17 MED ORDER — INSULIN ASPART 100 UNIT/ML IJ SOLN
0.0000 [IU] | Freq: Three times a day (TID) | INTRAMUSCULAR | Status: DC
  Administered 2021-12-17: 30 [IU] via SUBCUTANEOUS
  Administered 2021-12-17: 20 [IU] via SUBCUTANEOUS
  Administered 2021-12-18: 7 [IU] via SUBCUTANEOUS
  Administered 2021-12-18: 15 [IU] via SUBCUTANEOUS
  Administered 2021-12-18: 11 [IU] via SUBCUTANEOUS
  Administered 2021-12-19: 4 [IU] via SUBCUTANEOUS
  Administered 2021-12-19: 7 [IU] via SUBCUTANEOUS

## 2021-12-17 MED ORDER — METHYLPREDNISOLONE SODIUM SUCC 125 MG IJ SOLR
60.0000 mg | Freq: Two times a day (BID) | INTRAMUSCULAR | Status: DC
  Administered 2021-12-17 – 2021-12-18 (×2): 60 mg via INTRAVENOUS
  Filled 2021-12-17 (×2): qty 2

## 2021-12-17 MED ORDER — INSULIN GLARGINE-YFGN 100 UNIT/ML ~~LOC~~ SOLN
30.0000 [IU] | Freq: Every day | SUBCUTANEOUS | Status: DC
  Administered 2021-12-17: 30 [IU] via SUBCUTANEOUS
  Filled 2021-12-17 (×3): qty 0.3

## 2021-12-17 MED ORDER — GLUCERNA SHAKE PO LIQD
237.0000 mL | Freq: Two times a day (BID) | ORAL | Status: DC
  Administered 2021-12-17 – 2021-12-19 (×4): 237 mL via ORAL

## 2021-12-17 MED ORDER — INSULIN ASPART 100 UNIT/ML IJ SOLN
0.0000 [IU] | Freq: Every day | INTRAMUSCULAR | Status: DC
  Administered 2021-12-17: 4 [IU] via SUBCUTANEOUS
  Administered 2021-12-18: 2 [IU] via SUBCUTANEOUS

## 2021-12-17 MED ORDER — INSULIN ASPART 100 UNIT/ML IJ SOLN
0.0000 [IU] | Freq: Three times a day (TID) | INTRAMUSCULAR | Status: DC
  Administered 2021-12-17: 11 [IU] via SUBCUTANEOUS

## 2021-12-17 MED ORDER — HYDRALAZINE HCL 20 MG/ML IJ SOLN
10.0000 mg | INTRAMUSCULAR | Status: DC | PRN
  Administered 2021-12-17 – 2021-12-18 (×3): 10 mg via INTRAVENOUS
  Filled 2021-12-17 (×3): qty 1

## 2021-12-17 MED ORDER — VANCOMYCIN HCL 1250 MG/250ML IV SOLN
1250.0000 mg | INTRAVENOUS | Status: DC
  Administered 2021-12-17: 1250 mg via INTRAVENOUS
  Filled 2021-12-17: qty 250

## 2021-12-17 NOTE — Progress Notes (Signed)
Dr Manuella Ghazi aware of patient blood pressures throughout the shift. PRN hydralazine ordered and given. Patient had no complaints of chest pain, discomfort or shortness of breath.

## 2021-12-17 NOTE — Progress Notes (Signed)
PROGRESS NOTE    [name]  OFB:[id number] DOB: 12-19-1960 DOA: 12/15/2021 PCP: Bridget Hartshorn, NP   Brief Narrative:   [name]  is a 61 y.o. female, with history of COPD, depression, diabetes mellitus type 2, diastolic dysfunction, hypertension, and more presents to ED with a chief complaint of altered mental status.  Patient was noted to be severely hypoglycemic at her SNF.Marland Kitchen  She is also noted to have a new opacification to her right upper lobe suggestive of multifocal pneumonia despite having been on vancomycin, Rocephin, as well as doxycycline for ongoing treatment of her left lower extremity osteomyelitis.  She was admitted with acute metabolic encephalopathy in the setting of hypoglycemia and was also noted to have sepsis secondary to possible HCAP on admission.  She was also noted to have multiple electrolyte abnormalities.  Assessment & Plan:   Principal Problem:   Acute metabolic encephalopathy   Acute metabolic encephalopathy-multifactorial -Related to sepsis from HCAP/COVID as well as hypoglycemia -Mentation has currently improved and is back to near baseline -Continue close monitoring  Sepsis secondary to HCAP -Continue ongoing treatments as noted, reviewed with PCCM  Osteomyelitis -Continue antibiotics as prescribed through 1/16  Hyperglycemia in DM2 -Started on long-acting insulin, as well as mealtime -SSI  COPD -Continue to monitor with no signs of wheezing currently  HTN -Continue Norvasc and hydralazine, Inderal held today  COVID-positive -Adjusted course of Solu-Medrol -Continue remdesivir  Chronic anemia -Continue to monitor  DVT prophylaxis:Heparin Code Status: DNR Family Communication: Discussed with husband and son on 1/11 Disposition Plan:  Status is: Inpatient  Remains inpatient appropriate because: IV medications    Skin Assessment:  I have examined the patients skin and I agree with the wound assessment as  performed by the wound care RN as outlined below:  Pressure Injury 11/22/21 Leg Left;Lower Stage 3 -  Full thickness tissue loss. Subcutaneous fat may be visible but bone, tendon or muscle are NOT exposed. stage 3 to left lower leg measuring 2.5 cm x 2.5 cm (Active)  11/22/21 0132  Location: Leg  Location Orientation: Left;Lower  Staging: Stage 3 -  Full thickness tissue loss. Subcutaneous fat may be visible but bone, tendon or muscle are NOT exposed.  Wound Description (Comments): stage 3 to left lower leg measuring 2.5 cm x 2.5 cm  Present on Admission: Yes     Pressure Injury 11/22/21 Knee Left;Lower Stage 2 -  Partial thickness loss of dermis presenting as a shallow open injury with a red, pink wound bed without slough. stage 2 to left lower leg near knee measuring 3cm x 2.5 cm (Active)  11/22/21 0133  Location: Knee  Location Orientation: Left;Lower (knee area)  Staging: Stage 2 -  Partial thickness loss of dermis presenting as a shallow open injury with a red, pink wound bed without slough.  Wound Description (Comments): stage 2 to left lower leg near knee measuring 3cm x 2.5 cm  Present on Admission: Yes     Pressure Injury 12/16/21 Knee Anterior;Right Unstageable - Full thickness tissue loss in which the base of the injury is covered by slough (yellow, tan, gray, green or brown) and/or eschar (tan, brown or black) in the wound bed. (Active)  12/16/21 1029  Location: Knee  Location Orientation: Anterior;Right  Staging: Unstageable - Full thickness tissue loss in which the base of the injury is covered by slough (yellow, tan, gray, green or brown) and/or eschar (tan, brown or black) in the wound bed.  Wound Description (Comments):  Present on Admission: Yes    Consultants:  Discussed with PCCM  Procedures:  See below  Antimicrobials:  Anti-infectives (From admission, onward)    Start     Dose/Rate Route Frequency Ordered Stop   12/17/21 2200  ceFEPIme (MAXIPIME) 2 g in  sodium chloride 0.9 % 100 mL IVPB        2 g 200 mL/hr over 30 Minutes Intravenous Every 12 hours 12/17/21 0806     12/17/21 2200  vancomycin (VANCOREADY) IVPB 1250 mg/250 mL        1,250 mg 166.7 mL/hr over 90 Minutes Intravenous Every 24 hours 12/17/21 1211     12/17/21 1000  remdesivir 100 mg in sodium chloride 0.9 % 100 mL IVPB        100 mg 200 mL/hr over 30 Minutes Intravenous Daily 12/16/21 0753 12/21/21 0959   12/16/21 2200  vancomycin (VANCOREADY) IVPB 1500 mg/300 mL  Status:  Discontinued        1,500 mg 150 mL/hr over 120 Minutes Intravenous Every 24 hours 12/16/21 0428 12/17/21 1211   12/16/21 0845  remdesivir 100 mg in sodium chloride 0.9 % 100 mL IVPB        100 mg 200 mL/hr over 30 Minutes Intravenous Every 1 hr x 2 12/16/21 0753 12/16/21 1130   12/16/21 0800  ceFEPIme (MAXIPIME) 2 g in sodium chloride 0.9 % 100 mL IVPB  Status:  Discontinued        2 g 200 mL/hr over 30 Minutes Intravenous Every 8 hours 12/16/21 0428 12/17/21 0806   12/16/21 0400  metroNIDAZOLE (FLAGYL) IVPB 500 mg        500 mg 100 mL/hr over 60 Minutes Intravenous Every 12 hours 12/16/21 0309     12/16/21 0000  vancomycin (VANCOREADY) IVPB 2000 mg/400 mL        2,000 mg 200 mL/hr over 120 Minutes Intravenous  Once 12/15/21 2346 12/16/21 0731   12/15/21 2315  ceFEPIme (MAXIPIME) 2 g in sodium chloride 0.9 % 100 mL IVPB        2 g 200 mL/hr over 30 Minutes Intravenous  Once 12/15/21 2311 12/16/21 0730       Subjective: Patient seen and evaluated today with no new acute complaints or concerns. No acute concerns or events noted overnight. She is more awake and alert today. She is hyperglycemic this am.  Objective: Vitals:   12/17/21 1130 12/17/21 1200 12/17/21 1300 12/17/21 1330  BP:  (!) 179/75 (!) 188/73 (!) 188/73  Pulse:  62 65   Resp:  16 15   Temp: 98.1 F (36.7 C)     TempSrc: Oral     SpO2:  99% 97%   Weight:      Height:        Intake/Output Summary (Last 24 hours) at 12/17/2021  1402 Last data filed at 12/17/2021 1017 Gross per 24 hour  Intake 1053.48 ml  Output 1600 ml  Net -546.52 ml   Filed Weights   12/15/21 2222 12/17/21 0558  Weight: (!) 177.4 kg 132.9 kg    Examination:  General exam: Appears calm and comfortable, morbidly obese Respiratory system: Clear to auscultation. Respiratory effort normal. On [place] oxygen. Cardiovascular system: S1 & S2 heard, RRR.  Gastrointestinal system: Abdomen is soft Central nervous system: Alert and awake Extremities: No edema Skin: Multiple wounds throughout Psychiatry: Flat affect.    Data Reviewed: I have personally reviewed following labs and imaging studies  CBC: Recent Labs  Lab 12/15/21 2226 12/16/21  6045 12/17/21 0336  WBC 4.9 5.8 5.0  NEUTROABS 3.8 5.2  --   HGB 12.5 11.1* 10.7*  HCT 39.4 34.7* 33.3*  MCV 80.2 80.3 77.8*  PLT 263 220 409   Basic Metabolic Panel: Recent Labs  Lab 12/15/21 2226 12/16/21 0358 12/17/21 0336  NA 136 136 136  K 2.9* 3.6 3.5  CL 94* 96* 96*  CO2 33* 33* 28  GLUCOSE <20* 110* 313*  BUN 17 19 26*  CREATININE 1.21* 1.30* 1.51*  CALCIUM 7.9* 7.7* 7.8*  MG 1.5* 1.6* 1.8   GFR: Estimated Creatinine Clearance: 51.2 mL/min (A) (by C-G formula based on SCr of 1.51 mg/dL (H)). Liver Function Tests: Recent Labs  Lab 12/15/21 2226 12/16/21 0358  AST 25 21  ALT 17 13  ALKPHOS 127* 104  BILITOT 0.2* 0.4  PROT 6.7 5.9*  ALBUMIN 2.4* 2.1*   No results for input(s): LIPASE, AMYLASE in the last 168 hours. No results for input(s): AMMONIA in the last 168 hours. Coagulation Profile: Recent Labs  Lab 12/15/21 2226 12/16/21 0358  INR 1.0 1.0   Cardiac Enzymes: No results for input(s): CKTOTAL, CKMB, CKMBINDEX, TROPONINI in the last 168 hours. BNP (last 3 results) No results for input(s): PROBNP in the last 8760 hours. HbA1C: No results for input(s): HGBA1C in the last 72 hours. CBG: Recent Labs  Lab 12/16/21 1058 12/16/21 1640 12/16/21 2019  12/17/21 0815 12/17/21 1128  GLUCAP 145* 271* 257* 311* 391*   Lipid Profile: No results for input(s): CHOL, HDL, LDLCALC, TRIG, CHOLHDL, LDLDIRECT in the last 72 hours. Thyroid Function Tests: Recent Labs    12/16/21 0358  TSH 5.196*  FREET4 0.86   Anemia Panel: No results for input(s): VITAMINB12, FOLATE, FERRITIN, TIBC, IRON, RETICCTPCT in the last 72 hours. Sepsis Labs: Recent Labs  Lab 12/15/21 2226 12/16/21 0103 12/16/21 0358  PROCALCITON  --   --  <0.10  LATICACIDVEN 0.7 0.9 0.7    Recent Results (from the past 240 hour(s))  Blood culture (routine x 2)     Status: None (Preliminary result)   Collection Time: 12/15/21 10:32 PM   Specimen: BLOOD RIGHT FOREARM  Result Value Ref Range Status   Specimen Description BLOOD RIGHT FOREARM  Final   Special Requests   Final    BOTTLES DRAWN AEROBIC AND ANAEROBIC Blood Culture adequate volume   Culture   Final    NO GROWTH < 12 HOURS Performed at [facility], [address] [place], [place] [address]    Report Status PENDING  Incomplete  Blood culture (routine x 2)     Status: None (Preliminary result)   Collection Time: 12/15/21 11:21 PM   Specimen: BLOOD LEFT HAND  Result Value Ref Range Status   Specimen Description BLOOD LEFT HAND  Final   Special Requests   Final    BOTTLES DRAWN AEROBIC AND ANAEROBIC Blood Culture adequate volume   Culture   Final    NO GROWTH < 12 HOURS Performed at [facility], [address], [place],  [address]    Report Status PENDING  Incomplete  Resp Panel by RT-PCR (Flu A&B, Covid) Nasopharyngeal Swab     Status: Abnormal   Collection Time: 12/16/21 12:20 AM   Specimen: Nasopharyngeal Swab; Nasopharyngeal(NP) swabs in vial transport medium  Result Value Ref Range Status   SARS Coronavirus 2 by RT PCR POSITIVE (A) NEGATIVE Final    Comment: (NOTE) SARS-CoV-2 target nucleic acids are DETECTED.  The SARS-CoV-2 RNA is generally detectable in  upper respiratory specimens during  the acute phase of infection. Positive results are indicative of the presence of the identified virus, but do not rule out bacterial infection or co-infection with other pathogens not detected by the test. Clinical correlation with patient history and other diagnostic information is necessary to determine patient infection status. The expected result is Negative.  Fact Sheet for Patients: EntrepreneurPulse.com.au  Fact Sheet for Healthcare Providers: IncredibleEmployment.be  This test is not yet approved or cleared by the [place] FDA and  has been authorized for detection and/or diagnosis of SARS-CoV-2 by FDA under an Emergency Use Authorization (EUA).  This EUA will remain in effect (meaning this test can be used) for the duration of  the COVID-19 declaration under Section 564(b)(1) of the A ct, 21 U.S.C. section 360bbb-3(b)(1), unless the authorization is terminated or revoked sooner.     Influenza A by PCR NEGATIVE NEGATIVE Final   Influenza B by PCR NEGATIVE NEGATIVE Final    Comment: (NOTE) The Xpert Xpress SARS-CoV-2/FLU/RSV plus assay is intended as an aid in the diagnosis of influenza from Nasopharyngeal swab specimens and should not be used as a sole basis for treatment. Nasal washings and aspirates are unacceptable for Xpert Xpress SARS-CoV-2/FLU/RSV testing.  Fact Sheet for Patients: EntrepreneurPulse.com.au  Fact Sheet for Healthcare Providers: IncredibleEmployment.be  This test is not yet approved or cleared by the [place] FDA and has been authorized for detection and/or diagnosis of SARS-CoV-2 by FDA under an Emergency Use Authorization (EUA). This EUA will remain in effect (meaning this test can be used) for the duration of the COVID-19 declaration under Section 564(b)(1) of the Act, 21 U.S.C. section 360bbb-3(b)(1), unless the authorization is terminated  or revoked.  Performed at [facility], [address] [place], [place] [address]   MRSA Next Gen by PCR, Nasal     Status: None   Collection Time: 12/16/21  1:45 AM   Specimen: Nasal Mucosa; Nasal Swab  Result Value Ref Range Status   MRSA by PCR Next Gen NOT DETECTED NOT DETECTED Final    Comment: (NOTE) The GeneXpert MRSA Assay (FDA approved for NASAL specimens only), is one component of a comprehensive MRSA colonization surveillance program. It is not intended to diagnose MRSA infection nor to guide or monitor treatment for MRSA infections. Test performance is not FDA approved in patients less than 44 years old. Performed at [facility], [address], [place], [place] [address]   Expectorated Sputum Assessment w Gram Stain, Rflx to Resp Cult     Status: None   Collection Time: 12/17/21 12:42 PM   Specimen: Sputum  Result Value Ref Range Status   Specimen Description SPUTUM  Final   Special Requests NONE  Final   Sputum evaluation   Final    THIS SPECIMEN IS ACCEPTABLE FOR SPUTUM CULTURE Performed at [facility], [address] [place], [place] [address]    Report Status 12/17/2021 FINAL  Final         Radiology Studies: CT Head Wo Contrast  Result Date: 12/16/2021 CLINICAL DATA:  Mental status changes. EXAM: CT HEAD WITHOUT CONTRAST TECHNIQUE: Contiguous axial images were obtained from the base of the skull through the vertex without intravenous contrast. RADIATION DOSE REDUCTION: This exam was performed according to the departmental dose-optimization program which includes automated exposure control, adjustment of the mA and/or kV according to patient size and/or use of iterative reconstruction technique. COMPARISON:  11/26/2021 FINDINGS: Brain: There is atrophy and chronic small vessel disease changes.  No acute intracranial abnormality. Specifically, no hemorrhage, hydrocephalus, mass lesion, acute infarction, or significant intracranial injury. Vascular: No hyperdense  vessel or unexpected calcification. Skull: No acute calvarial abnormality. Sinuses/Orbits: Mucosal thickening throughout the paranasal sinuses. No air-fluid levels. Other: None IMPRESSION: Atrophy, chronic microvascular disease. No acute intracranial abnormality. Electronically Signed   By: Rolm Baptise M.D.   On: 12/16/2021 01:42   CT CHEST WO CONTRAST  Result Date: 12/16/2021 CLINICAL DATA:  Pneumonia EXAM: CT CHEST WITHOUT CONTRAST TECHNIQUE: Multidetector CT imaging of the chest was performed following the standard protocol without IV contrast. RADIATION DOSE REDUCTION: This exam was performed according to the departmental dose-optimization program which includes automated exposure control, adjustment of the mA and/or kV according to patient size and/or use of iterative reconstruction technique. COMPARISON:  Previous CT done on 09/02/2008 chest radiograph done on 12/15/2021 FINDINGS: Cardiovascular: Coronary artery calcifications are seen. There is dense calcification in the mitral annulus. Heart is enlarged in size. There is ectasia of main pulmonary artery measuring 3.6 cm. Mediastinum/Nodes: There are subcentimeter nodes in the mediastinum. Lungs/Pleura: There is interval improvement in aeration in the right upper lobe. Extensive patchy alveolar and ground-glass infiltrates are seen in both lungs more so on the right side. Small bilateral pleural effusions are seen, more so on the right side. Upper Abdomen: Surgical clips are seen in gallbladder fossa. Spleen is enlarged measuring 16.6 cm in AP diameter. Musculoskeletal: Unremarkable. IMPRESSION: There are extensive scattered patchy alveolar and ground-glass infiltrates in both lungs, more so on the right side. Findings suggest multifocal pneumonia in both lungs. Part of this finding may suggest underlying pulmonary edema. Bilateral pleural effusions, more so on the right side. There is interval improvement in aeration of right upper lobe, possibly  suggesting interval resolution of atelectasis. Coronary artery calcifications are seen. There is ectasia of main pulmonary artery suggesting pulmonary arterial hypertension. Splenomegaly. Electronically Signed   By: Elmer Picker M.D.   On: 12/16/2021 14:42   MR FOOT LEFT W WO CONTRAST  Result Date: 12/16/2021 CLINICAL DATA:  Foot swelling, nondiabetic, osteomyelitis suspected. Diabetic ulcer plantar aspect of left foot 1st MTP joint area. EXAM: MRI OF THE LEFT FOREFOOT WITHOUT AND WITH CONTRAST TECHNIQUE: Multiplanar, multisequence MR imaging of the left forefoot was performed both before and after administration of intravenous contrast. CONTRAST:  5mL GADAVIST GADOBUTROL 1 MMOL/ML IV SOLN COMPARISON:  Left foot radiographs 11/06/2021 11/21/2021 MRI left forefoot 11/23/2021, CT left foot 11/23/2021 FINDINGS: Unfortunately, there is moderate to high-grade patient motion artifact that technically degrades this examination. Bones/Joint/Cartilage Motion artifact markedly technically limits evaluation for possible cortical erosion. In the area of interest of the plantar ulcer at the level of the great toe metatarsophalangeal joint there is a bipartite medial great toe metatarsophalangeal joint sesamoid with diffuse marrow edema on both sides of the synchondrosis. This marrow edema may be secondary to stress related changes. No definite cortical erosion is seen to indicate acute osteomyelitis. There are moderate subchondral cysts with moderate cartilage thinning at the navicular-medial and navicular-intermediate cuneiform articulations. No acute fracture. Ligaments The Lisfranc ligament complex is grossly intact. The plantar plates are grossly intact. Muscles and Tendons Mild fluid around the flexor tendons of the midfoot, nonspecific. Mild edema within the abductor hallucis muscle body with mild associated enhancement. This suggest a nonspecific myositis. Soft tissues There is again thinning of the  superficial aspect of the subcutaneous fat plantar to the metatarsophalangeal joint and proximal aspect of the proximal phalanx of the great toe, an ulcer  measuring up to approximately 1.5 cm in transverse dimension and 2.0 cm in AP dimension with a depth of 0.5 cm. IMPRESSION:: IMPRESSION: 1. There is again a chronic soft tissue ulcer at the plantar aspect of the great toe metatarsophalangeal joint and proximal phalanx. 2. There is marrow edema throughout both sides of a bipartite medial great toe metatarsophalangeal joint sesamoid close to the ulcer, however this edema may represent stress reactive change. No definitive cortical erosion is seen to indicate acute osteomyelitis, however it is difficult to completely exclude osteomyelitis given the relative proximity to the chronic soft tissue ulcer. 3. There is enhancement and edema within the abductor hallucis muscle that extends posteriorly from the region of the plantar medial forefoot soft tissue ulcer. This is nonspecific. This may represent bland versus infected myositis. 4. Mild-to-moderate degenerative changes of the navicular-medial and navicular-intermediate cuneiform articulations. Electronically Signed   By: Yvonne Kendall   On: 12/16/2021 15:50   DG Chest Portable 1 View  Result Date: 12/15/2021 CLINICAL DATA:  sob EXAM: PORTABLE CHEST 1 VIEW COMPARISON:  Chest x-ray 11/26/2021, CT chest 09/01/2008 FINDINGS: Enlarged cardiac silhouette. The heart and mediastinal contours are unchanged. Complete opacification of the right upper lobe. Patchy airspace opacity within the right middle and lower lobes. No pulmonary edema. Likely trace bilateral pleural effusions. No pneumothorax. No acute osseous abnormality. IMPRESSION: 1. Complete opacification of the right upper lobe. Patchy airspace opacity within the right middle and lower lobes. Findings suggestive of multifocal pneumonia. Followup PA and lateral chest X-ray is recommended in 3-4 weeks following  therapy to ensure resolution. 2. Likely trace bilateral pleural effusions. Electronically Signed   By: Iven Finn M.D.   On: 12/15/2021 22:55        Scheduled Meds:  amLODipine  5 mg Oral Daily   atorvastatin  10 mg Oral QHS   Chlorhexidine Gluconate Cloth  6 each Topical Daily   etomidate  20 mg Intravenous Once   feeding supplement (GLUCERNA SHAKE)  237 mL Oral BID BM   gabapentin  100 mg Oral TID   Gerhardt's butt cream  1 application Topical BID   heparin  5,000 Units Subcutaneous Q8H   hydrALAZINE  25 mg Oral Q8H   insulin aspart  0-20 Units Subcutaneous TID WC   insulin aspart  0-5 Units Subcutaneous QHS   insulin aspart  12 Units Subcutaneous TID WC   insulin glargine-yfgn  30 Units Subcutaneous Daily   methylPREDNISolone (SOLU-MEDROL) injection  60 mg Intravenous Q12H   pantoprazole  40 mg Oral Daily   propranolol ER  80 mg Oral Daily   rocuronium bromide  100 mg Intravenous Once   sertraline  150 mg Oral Daily   Continuous Infusions:  ceFEPime (MAXIPIME) IV     metronidazole 500 mg (12/17/21 0415)   nitroGLYCERIN Stopped (12/16/21 0500)   remdesivir 100 mg in NS 100 mL 100 mg (12/17/21 1043)   vancomycin       LOS: 1 day    Time spent: 35 minutes    Ronell Duffus Darleen Crocker, DO [facility]  If 7PM-7AM, please contact night-coverage [email or web address] 12/17/2021, 2:02 PM

## 2021-12-17 NOTE — Consult Note (Signed)
Entered in erro

## 2021-12-17 NOTE — Progress Notes (Signed)
CBG at 1128am was 391. Dr Manuella Ghazi was made aware and insulin orders adjusted.   CBG at was 413. Dr Manuella Ghazi made aware. Per verbal from Dr Manuella Ghazi 30 units of sliding scale novolog to be given along with the 12 units novolog to be given. (Patient did eat 50% of dinner).

## 2021-12-17 NOTE — TOC Initial Note (Signed)
Transition of Care [facility]) - Initial/Assessment Note    Patient Details  Name: [name] MRN: [id number] Date of Birth: 02/24/1961  [facility] Care [facility]) CM/SW Contact:    Boneta Lucks, RN Phone Number: 12/17/2021, 11:29 AM  Clinical Narrative:   Patient admitted from [facility]  with Acute metabolic encephalopathy. TOC spoke with Debbie. Patient will need a longer stay at the facility. Jackelyn Poling will have social work start Insurance underwriter and discuss LTC beds when patient returns to the facility. MD updated.              Expected Discharge Plan: Skilled Nursing Facility Barriers to Discharge: Continued Medical Work up  Patient Goals and CMS Choice      Expected Discharge Plan and Services Expected Discharge Plan: [place]      Prior Living Arrangements/Services    Activities of Daily Living Home Assistive Devices/Equipment: None ADL Screening (condition at time of admission) Patient's cognitive ability adequate to safely complete daily activities?: Yes Is the patient deaf or have difficulty hearing?: No Does the patient have difficulty seeing, even when wearing glasses/contacts?: No Does the patient have difficulty concentrating, remembering, or making decisions?: No Patient able to express need for assistance with ADLs?: Yes Does the patient have difficulty dressing or bathing?: Yes Independently performs ADLs?: No Communication: Independent Dressing (OT): Needs assistance Is this a change from baseline?: Pre-admission baseline Grooming: Needs assistance Is this a change from baseline?: Pre-admission baseline Feeding: Independent Bathing: Needs assistance Is this a change from baseline?: Pre-admission baseline Toileting: Needs assistance Is this a change from baseline?: Pre-admission baseline In/Out Bed: Needs assistance Is this a change from baseline?: Pre-admission baseline Walks in Home: Dependent Is this a change from  baseline?: Pre-admission baseline Does the patient have difficulty walking or climbing stairs?: Yes Weakness of Legs: Both Weakness of Arms/Hands: Both  Permission Sought/Granted    Emotional Assessment   Admission diagnosis:  Acute metabolic encephalopathy [P59.16] Patient Active Problem List   Diagnosis Date Noted   Acute metabolic encephalopathy 38/46/6599   Iron deficiency anemia    Pressure injury of skin 11/24/2021   Open wound of left foot 11/22/2021   Chronic venous stasis 11/22/2021   Leukocytosis 11/22/2021   Thrombocytosis 11/22/2021   Hyperglycemia due to diabetes mellitus ([place]) 11/22/2021   Lactic acidosis 11/22/2021   Hypoalbuminemia due to protein-calorie malnutrition ([place]) 11/22/2021   Elevated liver enzymes 11/22/2021   Mixed hyperlipidemia 11/22/2021   GERD (gastroesophageal reflux disease) 11/22/2021   Chronic diastolic CHF (congestive heart failure) ([place]) 11/22/2021   Diabetic foot infection ([place]) 11/22/2021   Uncontrolled type 2 diabetes mellitus with hyperglycemia, with long-term current use of insulin ([place]) 11/22/2021   Lower extremity cellulitis 11/21/2021   Morbid obesity with BMI of 50.0-59.9, adult ([place]) 01/29/2020   Educated about COVID-19 virus infection 01/29/2020   OSA (obstructive sleep apnea) 12/21/2019   Asthma 35/70/1779   Diastolic dysfunction 39/02/91   Prolonged QT interval 05/14/2015   Palpitations 07/23/2014   Generalized weakness 07/23/2014   Diabetes mellitus ([place]) 07/22/2014   Essential hypertension 07/22/2014   COPD (chronic obstructive pulmonary disease) ([place]) 07/22/2014   Depression 07/22/2014   PCP:  Bridget Hartshorn, NP Pharmacy:   [place], [address] [place] [address] Phone: [phone] Fax: [phone]   Readmission Risk Interventions Readmission Risk Prevention Plan 12/17/2021 11/23/2021  Transportation Screening Complete Complete  Home Care  Screening Complete -  Medication Review (RN CM) Complete Complete  Some recent data might be hidden

## 2021-12-17 NOTE — Progress Notes (Signed)
Pharmacy Antibiotic Note  [name] is a 61 y.o. female admitted on 12/15/2021 with sepsis.  Pharmacy has been consulted for vancomycin and cefepime dosing. New opacification to her right upper lobe suggestive of multifocal pneumonia. MRSA PCR is negative. Patient on abx (ceftriaxone, doxycycline, and vancomycin).PTA for osteomyelitis, and escalated. Scr increased, will adjust abx dosing. Patient + for COVID (Last day of IV vancomycin 12/20/21 per med rec)  Plan: Change Vancomycin 1250mg  IV Q24H. Goal AUC 400-550.  Expected AUC 498.  SCr 1.5.  Change Cefepime 2g IV Q12h F/U cxs and clinical progress Monitor V/S, labs and levels as indicated  Height: 5\' 1"  (154.9 cm) Weight: 132.9 kg (292 lb 15.9 oz) IBW/kg (Calculated) : 47.8  Temp (24hrs), Avg:98.2 F (36.8 C), Min:98 F (36.7 C), Max:98.4 F (36.9 C)  Recent Labs  Lab 12/15/21 2226 12/16/21 0103 12/16/21 0358 12/17/21 0336  WBC 4.9  --  5.8 5.0  CREATININE 1.21*  --  1.30* 1.51*  LATICACIDVEN 0.7 0.9 0.7  --      Estimated Creatinine Clearance: 51.2 mL/min (A) (by C-G formula based on SCr of 1.51 mg/dL (H)).    Allergies  Allergen Reactions   Carvedilol Palpitations   Benicar [Olmesartan] Swelling   Codeine Other (See Comments)    Confusion    Sulfa Antibiotics Swelling    Whole face swells   Trulicity [Dulaglutide] Diarrhea    Thank you for allowing pharmacy to be a part of this patients care. Antiinfective: Remdesivir 1/11>> Vancomycin   .1/11:  >>  Cefepime 1/11: >> Flagyl 1/11>> Cultures: 1/11 MRSA [id number]  1/11 Covid: positive  1/11 Bcx: ngtd  Isac Sarna, BS Pharm D, BCPS Clinical Pharmacist Pager [phone] 12/17/2021 12:02 PM

## 2021-12-17 NOTE — Consult Note (Signed)
[place] Nurse Consult Note: Consult is placed for "wound care"  Patient is known to [facility] team from previous admission.  Admitted this encounter for sepsis that is likely related to pneumonia, but MRI being obtained of left lower extremity cellulitis to rule out osteomyelitis.  WOC Nurse Consult Note: Reason for Consult:Numerous skin lesions Wound type:Neuropathic, dermatologic, venous insufficiency vs mixed etiology, moisture associated skin damage, specifically erythema intertrigo   Pressure Injury POA: N/A Measurement:Numerous full and partial thickness skin lesions are not measured today. Wound bed:  Nonviable eschar on bilateral feet, 1st digit at plantar aspect, right foot lesion with purulent exudate Red, moist full thickness wounds on left lateral LE, serous exudate Partial and full thickness wounds on abdomen with purulent exudate in a small amount from a few punctate lesions, serous from most Crusted, flat lesions to abdomen, chronic inflammatory Drainage (amount, consistency, odor) As noted above Periwound: As noted above and intact Recommendations: Topical skin care guidance has been provided for Nursing to include turning and repositioning from side to side and minimizing time in the supine position and placement of feet into pressure redistribution heel boots.  A mattress replacement with low air loss feature will be provided and DermaTherapy bed linens used for her skin conditions.   Wound care guidance to the bilateral great toe ulcerations (full thickness, plantar aspect)   Cleanse wounds with soap and water and pat dry. Apply Xeroform gauze and cover with dry dressing and tape.  There is purulent discharge from the right foot, first metatarsal head wound. This is also to be applied to the left lateral LE full thickness wounds and areas of inflammation, covered with an ABD pad and secured with Kerlix/paper tape.   Our house antimicrobial moisture wicking textile (Interdry) is provided  for the subpannicular, inframammary and bilateral inguinal areas.  The abdomen and other areas are provided with Gerhart's Butt Cream, a compounded prescriptive consisting of Zinc Oxide, Lotrimin and hydrocortisone creams. This will be applied to the abdominal skin plaques.   Three areas on the bilateral proximal knees and left lateral/posterior thigh will be cleansed and then painted with a betadine swabstick twice daily.    Will not follow at this time.  Please re-consult if needed.  Domenic Moras MSN, RN, FNP-BC CWON Wound, Ostomy, Continence Nurse Pager [phone]

## 2021-12-18 DIAGNOSIS — G9341 Metabolic encephalopathy: Secondary | ICD-10-CM | POA: Diagnosis not present

## 2021-12-18 LAB — GLUCOSE, CAPILLARY
Glucose-Capillary: 217 mg/dL — ABNORMAL HIGH (ref 70–99)
Glucose-Capillary: 221 mg/dL — ABNORMAL HIGH (ref 70–99)
Glucose-Capillary: 231 mg/dL — ABNORMAL HIGH (ref 70–99)
Glucose-Capillary: 246 mg/dL — ABNORMAL HIGH (ref 70–99)
Glucose-Capillary: 264 mg/dL — ABNORMAL HIGH (ref 70–99)
Glucose-Capillary: 274 mg/dL — ABNORMAL HIGH (ref 70–99)
Glucose-Capillary: 304 mg/dL — ABNORMAL HIGH (ref 70–99)

## 2021-12-18 LAB — BASIC METABOLIC PANEL
Anion gap: 8 (ref 5–15)
BUN: 35 mg/dL — ABNORMAL HIGH (ref 6–20)
CO2: 30 mmol/L (ref 22–32)
Calcium: 8.2 mg/dL — ABNORMAL LOW (ref 8.9–10.3)
Chloride: 98 mmol/L (ref 98–111)
Creatinine, Ser: 1.64 mg/dL — ABNORMAL HIGH (ref 0.44–1.00)
GFR, Estimated: 36 mL/min — ABNORMAL LOW (ref >60)
Glucose, Bld: 251 mg/dL — ABNORMAL HIGH (ref 70–99)
Potassium: 3.4 mmol/L — ABNORMAL LOW (ref 3.5–5.1)
Sodium: 136 mmol/L (ref 135–145)

## 2021-12-18 LAB — URINE CULTURE: Culture: <10000 — AB

## 2021-12-18 LAB — CBC
HCT: 34.3 % — ABNORMAL LOW (ref 36.0–46.0)
Hemoglobin: 11.3 g/dL — ABNORMAL LOW (ref 12.0–15.0)
MCH: 26 pg (ref 26.0–34.0)
MCHC: 32.9 g/dL (ref 30.0–36.0)
MCV: 79 fL — ABNORMAL LOW (ref 80.0–100.0)
Platelets: 255 10*3/uL (ref 150–400)
RBC: 4.34 MIL/uL (ref 3.87–5.11)
RDW: 15.8 % — ABNORMAL HIGH (ref 11.5–15.5)
WBC: 7.5 10*3/uL (ref 4.0–10.5)
nRBC: 0 % (ref 0.0–0.2)

## 2021-12-18 LAB — MAGNESIUM: Magnesium: 1.9 mg/dL (ref 1.7–2.4)

## 2021-12-18 MED ORDER — POTASSIUM CHLORIDE CRYS ER 20 MEQ PO TBCR
40.0000 meq | EXTENDED_RELEASE_TABLET | Freq: Once | ORAL | Status: AC
  Administered 2021-12-18: 40 meq via ORAL
  Filled 2021-12-18: qty 2

## 2021-12-18 MED ORDER — INSULIN GLARGINE-YFGN 100 UNIT/ML ~~LOC~~ SOLN
40.0000 [IU] | Freq: Every day | SUBCUTANEOUS | Status: DC
  Administered 2021-12-18 – 2021-12-19 (×2): 40 [IU] via SUBCUTANEOUS
  Filled 2021-12-18 (×3): qty 0.4

## 2021-12-18 MED ORDER — DOXYCYCLINE HYCLATE 100 MG PO TABS
100.0000 mg | ORAL_TABLET | Freq: Two times a day (BID) | ORAL | Status: DC
  Administered 2021-12-18 – 2021-12-19 (×3): 100 mg via ORAL
  Filled 2021-12-18 (×3): qty 1

## 2021-12-18 MED ORDER — LACTATED RINGERS IV SOLN: INTRAVENOUS | Status: DC

## 2021-12-18 MED ORDER — PREDNISONE 20 MG PO TABS
40.0000 mg | ORAL_TABLET | Freq: Every day | ORAL | Status: DC
  Administered 2021-12-19: 40 mg via ORAL
  Filled 2021-12-18: qty 2

## 2021-12-18 NOTE — Care Management Important Message (Signed)
Important Message  Patient Details  Name: [name] MRN: [id number] Date of Birth: [birth year]   Medicare Important Message Given:  Yes - Important Message mailed due to current National Emergency     Tommy Medal 12/18/2021, 1:43 PM

## 2021-12-18 NOTE — TOC Progression Note (Signed)
Transition of Care [facility]) - Progression Note    Patient Details  Name: [name] MRN: [id number] Date of Birth: [birth year]  Transition of Care [facility]) CM/SW [place], [place] Phone Number: 12/18/2021, 10:44 AM  Clinical Narrative:    CSW updated that pt may be ready for D/C back to [facility]. CSW started insurance auth as pt will likely D/C on IV antibiotics. TOC to follow.   Expected Discharge Plan: Skilled Nursing Facility Barriers to Discharge: Continued Medical Work up  Expected Discharge Plan and Services Expected Discharge Plan: [place]                                               Social Determinants of Health (SDOH) Interventions    Readmission Risk Interventions Readmission Risk Prevention Plan 12/17/2021 11/23/2021  Transportation Screening Complete Complete  Home Care Screening Complete -  Medication Review (RN CM) Complete Complete  Some recent data might be hidden

## 2021-12-18 NOTE — Progress Notes (Signed)
PROGRESS NOTE    [name]  KKX:[id number] DOB: Apr 06, 1961 DOA: 12/15/2021 PCP: Bridget Hartshorn, NP   Brief Narrative:   [name]  is a 61 y.o. female, with history of COPD, depression, diabetes mellitus type 2, diastolic dysfunction, hypertension, and more presents to ED with a chief complaint of altered mental status.  Patient was noted to be severely hypoglycemic at her SNF.Marland Kitchen  She is also noted to have a new opacification to her right upper lobe suggestive of multifocal pneumonia despite having been on vancomycin, Rocephin, as well as doxycycline for ongoing treatment of her left lower extremity osteomyelitis.  She was admitted with acute metabolic encephalopathy in the setting of hypoglycemia and was also noted to have sepsis secondary to possible HCAP on admission.  She was also noted to have multiple electrolyte abnormalities.  Assessment & Plan:   Principal Problem:   Acute metabolic encephalopathy   Acute metabolic encephalopathy-multifactorial -Continues to have some transient delirium -Related to sepsis from HCAP/COVID as well as hypoglycemia -Appears to have confusion for last several months with possibly some baseline dementia noted; will likely require neurology follow-up outpatient -Mentation has currently improved and is back to near baseline -Continue close monitoring -Ok for tele transfer 1/13   Sepsis secondary to HCAP -Continue ongoing treatments as noted with cefepime for now, reviewed with PCCM -May discharge on doxycycline and Augmentin as noted below once ready  AKI -Baseline creatinine 0.8-1.1 -Start IV fluid -Monitor strict I's and O's -Discontinue vancomycin -Repeat a.m. labs   Osteomyelitis -Continue antibiotics with doxycycline and cefepime -Discontinue Flagyl and vancomycin -Plan to switch to Augmentin and doxycycline for 2 weeks starting 1/16   Hyperglycemia in DM2 -Currently stabilizing with prior doses of long-acting  insulin and mealtime doses -SSI   COPD -Continue to monitor with no signs of wheezing currently   HTN -Continue Norvasc and hydralazine, Inderal held today   COVID-positive -Adjusted course of Solu-Medrol, now to prednisone starting 1/14 -Continue remdesivir   Chronic anemia -Continue to monitor  Morbid obesity -BMI 55 -Lifestyle changes outpatient   DVT prophylaxis:Heparin Code Status: DNR Family Communication: Discussed with husband on phone 1/13 Disposition Plan:  Status is: Inpatient   Remains inpatient appropriate because: IV medications       Skin Assessment:   I have examined the patients skin and I agree with the wound assessment as performed by the wound care RN as outlined below:   Pressure Injury 11/22/21 Leg Left;Lower Stage 3 -  Full thickness tissue loss. Subcutaneous fat may be visible but bone, tendon or muscle are NOT exposed. stage 3 to left lower leg measuring 2.5 cm x 2.5 cm (Active)  11/22/21 0132  Location: Leg  Location Orientation: Left;Lower  Staging: Stage 3 -  Full thickness tissue loss. Subcutaneous fat may be visible but bone, tendon or muscle are NOT exposed.  Wound Description (Comments): stage 3 to left lower leg measuring 2.5 cm x 2.5 cm  Present on Admission: Yes     Pressure Injury 11/22/21 Knee Left;Lower Stage 2 -  Partial thickness loss of dermis presenting as a shallow open injury with a red, pink wound bed without slough. stage 2 to left lower leg near knee measuring 3cm x 2.5 cm (Active)  11/22/21 0133  Location: Knee  Location Orientation: Left;Lower (knee area)  Staging: Stage 2 -  Partial thickness loss of dermis presenting as a shallow open injury with a red, pink wound bed without slough.  Wound Description (Comments): stage  2 to left lower leg near knee measuring 3cm x 2.5 cm  Present on Admission: Yes     Pressure Injury 12/16/21 Knee Anterior;Right Unstageable - Full thickness tissue loss in which the base of the  injury is covered by slough (yellow, tan, gray, green or brown) and/or eschar (tan, brown or black) in the wound bed. (Active)  12/16/21 1029  Location: Knee  Location Orientation: Anterior;Right  Staging: Unstageable - Full thickness tissue loss in which the base of the injury is covered by slough (yellow, tan, gray, green or brown) and/or eschar (tan, brown or black) in the wound bed.  Wound Description (Comments):   Present on Admission: Yes      Consultants:  Discussed with PCCM   Procedures:  See below  Antimicrobials:  Anti-infectives (From admission, onward)    Start     Dose/Rate Route Frequency Ordered Stop   12/18/21 1100  doxycycline (VIBRA-TABS) tablet 100 mg        100 mg Oral Every 12 hours 12/18/21 1013     12/17/21 2200  ceFEPIme (MAXIPIME) 2 g in sodium chloride 0.9 % 100 mL IVPB        2 g 200 mL/hr over 30 Minutes Intravenous Every 12 hours 12/17/21 0806     12/17/21 2200  vancomycin (VANCOREADY) IVPB 1250 mg/250 mL  Status:  Discontinued        1,250 mg 166.7 mL/hr over 90 Minutes Intravenous Every 24 hours 12/17/21 1211 12/18/21 1012   12/17/21 1000  remdesivir 100 mg in sodium chloride 0.9 % 100 mL IVPB        100 mg 200 mL/hr over 30 Minutes Intravenous Daily 12/16/21 0753 12/21/21 0959   12/16/21 2200  vancomycin (VANCOREADY) IVPB 1500 mg/300 mL  Status:  Discontinued        1,500 mg 150 mL/hr over 120 Minutes Intravenous Every 24 hours 12/16/21 0428 12/17/21 1211   12/16/21 0845  remdesivir 100 mg in sodium chloride 0.9 % 100 mL IVPB        100 mg 200 mL/hr over 30 Minutes Intravenous Every 1 hr x 2 12/16/21 0753 12/16/21 1130   12/16/21 0800  ceFEPIme (MAXIPIME) 2 g in sodium chloride 0.9 % 100 mL IVPB  Status:  Discontinued        2 g 200 mL/hr over 30 Minutes Intravenous Every 8 hours 12/16/21 0428 12/17/21 0806   12/16/21 0400  metroNIDAZOLE (FLAGYL) IVPB 500 mg  Status:  Discontinued        500 mg 100 mL/hr over 60 Minutes Intravenous Every 12  hours 12/16/21 0309 12/18/21 1013   12/16/21 0000  vancomycin (VANCOREADY) IVPB 2000 mg/400 mL        2,000 mg 200 mL/hr over 120 Minutes Intravenous  Once 12/15/21 2346 12/16/21 0731   12/15/21 2315  ceFEPIme (MAXIPIME) 2 g in sodium chloride 0.9 % 100 mL IVPB        2 g 200 mL/hr over 30 Minutes Intravenous  Once 12/15/21 2311 12/16/21 0730       Subjective: Patient seen and evaluated today with no new acute complaints or concerns. No acute concerns or events noted overnight.  She still has periods of ongoing confusion, but her blood glucose levels have stabilized.  Objective: Vitals:   12/18/21 0755 12/18/21 0800 12/18/21 0825 12/18/21 0900  BP:  (!) 157/63  (!) 141/61  Pulse:  66  70  Resp:  12  17  Temp: (!) 96.7 F (35.9 C)  TempSrc: Oral     SpO2:  97% 99% 91%  Weight:      Height:        Intake/Output Summary (Last 24 hours) at 12/18/2021 1016 Last data filed at 12/18/2021 0755 Gross per 24 hour  Intake 513.45 ml  Output 2300 ml  Net -1786.55 ml   Filed Weights   12/15/21 2222 12/17/21 0558 12/18/21 0655  Weight: (!) 177.4 kg 132.9 kg 132.3 kg    Examination:  General exam: Appears calm and comfortable, morbidly obese Respiratory system: Clear to auscultation. Respiratory effort normal. Cardiovascular system: S1 & S2 heard, RRR.  Gastrointestinal system: Abdomen is soft Central nervous system: Alert and awake Extremities: No edema Skin: No significant lesions noted, wounds throughout Psychiatry: Flat affect.    Data Reviewed: I have personally reviewed following labs and imaging studies  CBC: Recent Labs  Lab 12/15/21 2226 12/16/21 0358 12/17/21 0336 12/18/21 0413  WBC 4.9 5.8 5.0 7.5  NEUTROABS 3.8 5.2  --   --   HGB 12.5 11.1* 10.7* 11.3*  HCT 39.4 34.7* 33.3* 34.3*  MCV 80.2 80.3 77.8* 79.0*  PLT 263 220 243 950   Basic Metabolic Panel: Recent Labs  Lab 12/15/21 2226 12/16/21 0358 12/17/21 0336 12/18/21 0413  NA 136 136 136 136   K 2.9* 3.6 3.5 3.4*  CL 94* 96* 96* 98  CO2 33* 33* 28 30  GLUCOSE <20* 110* 313* 251*  BUN 17 19 26* 35*  CREATININE 1.21* 1.30* 1.51* 1.64*  CALCIUM 7.9* 7.7* 7.8* 8.2*  MG 1.5* 1.6* 1.8 1.9   GFR: Estimated Creatinine Clearance: 47 mL/min (A) (by C-G formula based on SCr of 1.64 mg/dL (H)). Liver Function Tests: Recent Labs  Lab 12/15/21 2226 12/16/21 0358  AST 25 21  ALT 17 13  ALKPHOS 127* 104  BILITOT 0.2* 0.4  PROT 6.7 5.9*  ALBUMIN 2.4* 2.1*   No results for input(s): LIPASE, AMYLASE in the last 168 hours. No results for input(s): AMMONIA in the last 168 hours. Coagulation Profile: Recent Labs  Lab 12/15/21 2226 12/16/21 0358  INR 1.0 1.0   Cardiac Enzymes: No results for input(s): CKTOTAL, CKMB, CKMBINDEX, TROPONINI in the last 168 hours. BNP (last 3 results) No results for input(s): PROBNP in the last 8760 hours. HbA1C: No results for input(s): HGBA1C in the last 72 hours. CBG: Recent Labs  Lab 12/17/21 1935 12/17/21 2144 12/18/21 0044 12/18/21 0409 12/18/21 0746  GLUCAP 367* 321* 274* 231* 221*   Lipid Profile: No results for input(s): CHOL, HDL, LDLCALC, TRIG, CHOLHDL, LDLDIRECT in the last 72 hours. Thyroid Function Tests: Recent Labs    12/16/21 0358  TSH 5.196*  FREET4 0.86   Anemia Panel: No results for input(s): VITAMINB12, FOLATE, FERRITIN, TIBC, IRON, RETICCTPCT in the last 72 hours. Sepsis Labs: Recent Labs  Lab 12/15/21 2226 12/16/21 0103 12/16/21 0358  PROCALCITON  --   --  <0.10  LATICACIDVEN 0.7 0.9 0.7    Recent Results (from the past 240 hour(s))  Blood culture (routine x 2)     Status: None (Preliminary result)   Collection Time: 12/15/21 10:32 PM   Specimen: BLOOD RIGHT FOREARM  Result Value Ref Range Status   Specimen Description BLOOD RIGHT FOREARM  Final   Special Requests   Final    BOTTLES DRAWN AEROBIC AND ANAEROBIC Blood Culture adequate volume   Culture   Final    NO GROWTH 3 DAYS Performed at [facility], [address], [place], [place]  [address]    Report Status PENDING  Incomplete  Blood culture (routine x 2)     Status: None (Preliminary result)   Collection Time: 12/15/21 11:21 PM   Specimen: BLOOD LEFT HAND  Result Value Ref Range Status   Specimen Description BLOOD LEFT HAND  Final   Special Requests   Final    BOTTLES DRAWN AEROBIC AND ANAEROBIC Blood Culture adequate volume   Culture   Final    NO GROWTH 3 DAYS Performed at [facility], [address], [place], [place] [address]    Report Status PENDING  Incomplete  Resp Panel by RT-PCR (Flu A&B, Covid) Nasopharyngeal Swab     Status: Abnormal   Collection Time: 12/16/21 12:20 AM   Specimen: Nasopharyngeal Swab; Nasopharyngeal(NP) swabs in vial transport medium  Result Value Ref Range Status   SARS Coronavirus 2 by RT PCR POSITIVE (A) NEGATIVE Final    Comment: (NOTE) SARS-CoV-2 target nucleic acids are DETECTED.  The SARS-CoV-2 RNA is generally detectable in upper respiratory specimens during the acute phase of infection. Positive results are indicative of the presence of the identified virus, but do not rule out bacterial infection or co-infection with other pathogens not detected by the test. Clinical correlation with patient history and other diagnostic information is necessary to determine patient infection status. The expected result is Negative.  Fact Sheet for Patients: EntrepreneurPulse.com.au  Fact Sheet for Healthcare Providers: IncredibleEmployment.be  This test is not yet approved or cleared by the [place] FDA and  has been authorized for detection and/or diagnosis of SARS-CoV-2 by FDA under an Emergency Use Authorization (EUA).  This EUA will remain in effect (meaning this test can be used) for the duration of  the COVID-19 declaration under Section 564(b)(1) of the A ct, 21 U.S.C. section 360bbb-3(b)(1), unless the authorization is terminated or revoked  sooner.     Influenza A by PCR NEGATIVE NEGATIVE Final   Influenza B by PCR NEGATIVE NEGATIVE Final    Comment: (NOTE) The Xpert Xpress SARS-CoV-2/FLU/RSV plus assay is intended as an aid in the diagnosis of influenza from Nasopharyngeal swab specimens and should not be used as a sole basis for treatment. Nasal washings and aspirates are unacceptable for Xpert Xpress SARS-CoV-2/FLU/RSV testing.  Fact Sheet for Patients: EntrepreneurPulse.com.au  Fact Sheet for Healthcare Providers: IncredibleEmployment.be  This test is not yet approved or cleared by the [place] FDA and has been authorized for detection and/or diagnosis of SARS-CoV-2 by FDA under an Emergency Use Authorization (EUA). This EUA will remain in effect (meaning this test can be used) for the duration of the COVID-19 declaration under Section 564(b)(1) of the Act, 21 U.S.C. section 360bbb-3(b)(1), unless the authorization is terminated or revoked.  Performed at [facility], [address], [place], [place] [address]   MRSA Next Gen by PCR, Nasal     Status: None   Collection Time: 12/16/21  1:45 AM   Specimen: Nasal Mucosa; Nasal Swab  Result Value Ref Range Status   MRSA by PCR Next Gen NOT DETECTED NOT DETECTED Final    Comment: (NOTE) The GeneXpert MRSA Assay (FDA approved for NASAL specimens only), is one component of a comprehensive MRSA colonization surveillance program. It is not intended to diagnose MRSA infection nor to guide or monitor treatment for MRSA infections. Test performance is not FDA approved in patients less than 42 years old. Performed at [facility], [address] [place], [place] [address]   Expectorated Sputum Assessment w Gram Stain, Rflx  to Resp Cult     Status: None   Collection Time: 12/17/21 12:42 PM   Specimen: Sputum  Result Value Ref Range Status   Specimen Description SPUTUM  Final   Special Requests NONE  Final   Sputum evaluation    Final    THIS SPECIMEN IS ACCEPTABLE FOR SPUTUM CULTURE Performed at [facility], [address], [place], [place] [address]    Report Status 12/17/2021 FINAL  Final  Culture, Respiratory w Gram Stain     Status: None (Preliminary result)   Collection Time: 12/17/21 12:42 PM   Specimen: SPU  Result Value Ref Range Status   Specimen Description   Final    SPUTUM Performed at [facility], [address], [place], [place] [address]    Special Requests   Final    NONE Reflexed from [id number] Performed at [facility], [address], [place], [place] [address]    Gram Stain   Final    NO SQUAMOUS EPITHELIAL CELLS SEEN FEW WBC SEEN FEW GRAM POSITIVE COCCI    Culture   Final    CULTURE REINCUBATED FOR BETTER GROWTH Performed at [facility], [place] [address] [place] [address] [place], [place] [address]    Report Status PENDING  Incomplete         Radiology Studies: CT CHEST WO CONTRAST  Result Date: 12/16/2021 CLINICAL DATA:  Pneumonia EXAM: CT CHEST WITHOUT CONTRAST TECHNIQUE: Multidetector CT imaging of the chest was performed following the standard protocol without IV contrast. RADIATION DOSE REDUCTION: This exam was performed according to the departmental dose-optimization program which includes automated exposure control, adjustment of the mA and/or kV according to patient size and/or use of iterative reconstruction technique. COMPARISON:  Previous CT done on 09/02/2008 chest radiograph done on 12/15/2021 FINDINGS: Cardiovascular: Coronary artery calcifications are seen. There is dense calcification in the mitral annulus. Heart is enlarged in size. There is ectasia of main pulmonary artery measuring 3.6 cm. Mediastinum/Nodes: There are subcentimeter nodes in the mediastinum. Lungs/Pleura: There is interval improvement in aeration in the right upper lobe. Extensive patchy alveolar and ground-glass infiltrates are seen in both lungs more so on the right side. Small bilateral pleural effusions are  seen, more so on the right side. Upper Abdomen: Surgical clips are seen in gallbladder fossa. Spleen is enlarged measuring 16.6 cm in AP diameter. Musculoskeletal: Unremarkable. IMPRESSION: There are extensive scattered patchy alveolar and ground-glass infiltrates in both lungs, more so on the right side. Findings suggest multifocal pneumonia in both lungs. Part of this finding may suggest underlying pulmonary edema. Bilateral pleural effusions, more so on the right side. There is interval improvement in aeration of right upper lobe, possibly suggesting interval resolution of atelectasis. Coronary artery calcifications are seen. There is ectasia of main pulmonary artery suggesting pulmonary arterial hypertension. Splenomegaly. Electronically Signed   By: Elmer Picker M.D.   On: 12/16/2021 14:42   MR FOOT LEFT W WO CONTRAST  Result Date: 12/16/2021 CLINICAL DATA:  Foot swelling, nondiabetic, osteomyelitis suspected. Diabetic ulcer plantar aspect of left foot 1st MTP joint area. EXAM: MRI OF THE LEFT FOREFOOT WITHOUT AND WITH CONTRAST TECHNIQUE: Multiplanar, multisequence MR imaging of the left forefoot was performed both before and after administration of intravenous contrast. CONTRAST:  44mL GADAVIST GADOBUTROL 1 MMOL/ML IV SOLN COMPARISON:  Left foot radiographs 11/06/2021 11/21/2021 MRI left forefoot 11/23/2021, CT left foot 11/23/2021 FINDINGS: Unfortunately, there is moderate to high-grade patient motion artifact that technically degrades this examination. Bones/Joint/Cartilage Motion artifact markedly technically  limits evaluation for possible cortical erosion. In the area of interest of the plantar ulcer at the level of the great toe metatarsophalangeal joint there is a bipartite medial great toe metatarsophalangeal joint sesamoid with diffuse marrow edema on both sides of the synchondrosis. This marrow edema may be secondary to stress related changes. No definite cortical erosion is seen to  indicate acute osteomyelitis. There are moderate subchondral cysts with moderate cartilage thinning at the navicular-medial and navicular-intermediate cuneiform articulations. No acute fracture. Ligaments The Lisfranc ligament complex is grossly intact. The plantar plates are grossly intact. Muscles and Tendons Mild fluid around the flexor tendons of the midfoot, nonspecific. Mild edema within the abductor hallucis muscle body with mild associated enhancement. This suggest a nonspecific myositis. Soft tissues There is again thinning of the superficial aspect of the subcutaneous fat plantar to the metatarsophalangeal joint and proximal aspect of the proximal phalanx of the great toe, an ulcer measuring up to approximately 1.5 cm in transverse dimension and 2.0 cm in AP dimension with a depth of 0.5 cm. IMPRESSION:: IMPRESSION: 1. There is again a chronic soft tissue ulcer at the plantar aspect of the great toe metatarsophalangeal joint and proximal phalanx. 2. There is marrow edema throughout both sides of a bipartite medial great toe metatarsophalangeal joint sesamoid close to the ulcer, however this edema may represent stress reactive change. No definitive cortical erosion is seen to indicate acute osteomyelitis, however it is difficult to completely exclude osteomyelitis given the relative proximity to the chronic soft tissue ulcer. 3. There is enhancement and edema within the abductor hallucis muscle that extends posteriorly from the region of the plantar medial forefoot soft tissue ulcer. This is nonspecific. This may represent bland versus infected myositis. 4. Mild-to-moderate degenerative changes of the navicular-medial and navicular-intermediate cuneiform articulations. Electronically Signed   By: Yvonne Kendall   On: 12/16/2021 15:50        Scheduled Meds:  amLODipine  5 mg Oral Daily   atorvastatin  10 mg Oral QHS   Chlorhexidine Gluconate Cloth  6 each Topical Daily   doxycycline  100 mg Oral  Q12H   etomidate  20 mg Intravenous Once   feeding supplement (GLUCERNA SHAKE)  237 mL Oral BID BM   gabapentin  100 mg Oral TID   Gerhardt's butt cream  1 application Topical BID   heparin  5,000 Units Subcutaneous Q8H   hydrALAZINE  25 mg Oral Q8H   insulin aspart  0-20 Units Subcutaneous TID WC   insulin aspart  0-5 Units Subcutaneous QHS   insulin aspart  12 Units Subcutaneous TID WC   insulin glargine-yfgn  40 Units Subcutaneous Daily   pantoprazole  40 mg Oral Daily   potassium chloride  40 mEq Oral Once   [START ON 12/19/2021] predniSONE  40 mg Oral Q breakfast   propranolol ER  80 mg Oral Daily   rocuronium bromide  100 mg Intravenous Once   sertraline  150 mg Oral Daily   Continuous Infusions:  ceFEPime (MAXIPIME) IV Stopped (12/17/21 2207)   lactated ringers     remdesivir 100 mg in NS 100 mL Stopped (12/17/21 1113)     LOS: 2 days    Time spent: 35 minutes    Zavon Hyson Darleen Crocker, DO [facility]  If 7PM-7AM, please contact night-coverage [email or web address] 12/18/2021, 10:16 AM

## 2021-12-19 DIAGNOSIS — G9341 Metabolic encephalopathy: Secondary | ICD-10-CM | POA: Diagnosis not present

## 2021-12-19 LAB — BASIC METABOLIC PANEL
Anion gap: 7 (ref 5–15)
BUN: 37 mg/dL — ABNORMAL HIGH (ref 6–20)
CO2: 30 mmol/L (ref 22–32)
Calcium: 8.4 mg/dL — ABNORMAL LOW (ref 8.9–10.3)
Chloride: 101 mmol/L (ref 98–111)
Creatinine, Ser: 1.5 mg/dL — ABNORMAL HIGH (ref 0.44–1.00)
GFR, Estimated: 40 mL/min — ABNORMAL LOW (ref >60)
Glucose, Bld: 183 mg/dL — ABNORMAL HIGH (ref 70–99)
Potassium: 3.4 mmol/L — ABNORMAL LOW (ref 3.5–5.1)
Sodium: 138 mmol/L (ref 135–145)

## 2021-12-19 LAB — CBC
HCT: 32.1 % — ABNORMAL LOW (ref 36.0–46.0)
Hemoglobin: 10.4 g/dL — ABNORMAL LOW (ref 12.0–15.0)
MCH: 25.8 pg — ABNORMAL LOW (ref 26.0–34.0)
MCHC: 32.4 g/dL (ref 30.0–36.0)
MCV: 79.7 fL — ABNORMAL LOW (ref 80.0–100.0)
Platelets: 223 10*3/uL (ref 150–400)
RBC: 4.03 MIL/uL (ref 3.87–5.11)
RDW: 15.8 % — ABNORMAL HIGH (ref 11.5–15.5)
WBC: 8.4 10*3/uL (ref 4.0–10.5)
nRBC: 0 % (ref 0.0–0.2)

## 2021-12-19 LAB — GLUCOSE, CAPILLARY
Glucose-Capillary: 186 mg/dL — ABNORMAL HIGH (ref 70–99)
Glucose-Capillary: 179 mg/dL — ABNORMAL HIGH (ref 70–99)
Glucose-Capillary: 211 mg/dL — ABNORMAL HIGH (ref 70–99)

## 2021-12-19 LAB — CULTURE, RESPIRATORY W GRAM STAIN
Culture: NORMAL
Gram Stain: NONE SEEN

## 2021-12-19 LAB — MAGNESIUM: Magnesium: 1.9 mg/dL (ref 1.7–2.4)

## 2021-12-19 MED ORDER — INSULIN GLARGINE-YFGN 100 UNIT/ML ~~LOC~~ SOLN: 40.0000 [IU] | Freq: Every day | SUBCUTANEOUS | 11 refills | Status: DC

## 2021-12-19 MED ORDER — CEFEPIME IV (FOR PTA / DISCHARGE USE ONLY): 2.0000 g | Freq: Two times a day (BID) | INTRAVENOUS | 0 refills | Status: AC

## 2021-12-19 MED ORDER — POTASSIUM CHLORIDE CRYS ER 20 MEQ PO TBCR
40.0000 meq | EXTENDED_RELEASE_TABLET | Freq: Once | ORAL | Status: AC
  Administered 2021-12-19: 40 meq via ORAL
  Filled 2021-12-19: qty 2

## 2021-12-19 NOTE — Progress Notes (Signed)
PHARMACY CONSULT NOTE FOR:  OUTPATIENT  PARENTERAL ANTIBIOTIC THERAPY (OPAT)  Indication: osteomyelitis/HCAP Regimen: Cefepime 2000 mg IV every 12 hours End date: Last day of therapy 12/20/2020  IV antibiotic discharge orders are pended. To discharging provider:  please sign these orders via discharge navigator,  Select New Orders & click on the button choice - Manage This Unsigned Work.     Thank you for allowing pharmacy to be a part of this patient's care.  Ramond Craver 12/19/2021, 9:26 AM

## 2021-12-19 NOTE — Discharge Summary (Signed)
Physician Discharge Summary  [name] PJK:[id number] DOB: 06-May-1961 DOA: 12/15/2021  PCP: Teresa Hartshorn, NP  Admit date: 12/15/2021  Discharge date: 12/19/2021  Admitted From:SNF  Disposition:  SNF  Recommendations for Outpatient Follow-up:  Follow up with PCP in 1-2 weeks Continue on IV cefepime through 1/15 and then transition to oral Augmentin and doxycycline for 2 more weeks as prescribed Continue other medications as noted below Please arrange follow-up with outpatient palliative care  Home Health: None  Equipment/Devices: None  Discharge Condition:Stable  CODE STATUS: DNR  Diet recommendation: Heart Healthy/carb modified  Brief/Interim Summary: [name]  is a 61 y.o. female, with history of COPD, depression, diabetes mellitus type 2, diastolic dysfunction, hypertension, and more presents to ED with a chief complaint of altered mental status.  Patient was noted to be severely hypoglycemic at her SNF. She is also noted to have a new opacification to her right upper lobe suggestive of multifocal pneumonia despite having been on vancomycin, Rocephin, as well as doxycycline for ongoing treatment of her left lower extremity osteomyelitis.  She was admitted with acute metabolic encephalopathy in the setting of hypoglycemia and was also noted to have sepsis secondary to possible HCAP on admission.  She was also noted to have multiple electrolyte abnormalities.  Her blood glucose levels have now stabilized and her antibiotics have been adjusted to IV cefepime as well as oral doxycycline.  She will continue on these antibiotics as noted above and is in stable condition for discharge back to SNF.  More than likely, she will require long-term care at the facility as she is nonambulatory with morbid obesity.  Outpatient palliative care has also been suggested.  Discharge Diagnoses:  Principal Problem:   Acute metabolic encephalopathy  Principal discharge diagnosis:  Acute metabolic encephalopathy-multifactorial with component of delirium related to sepsis secondary to HCAP/COVID as well as labile blood glucose control.  Discharge Instructions  Discharge Instructions     [facility] pharmacist to adjust dose for Vancomycin, Aminoglycosides and other anti-infective therapies as requested by physician.   Complete by: As directed    [facility] to provide Cath Flo 27m   Complete by: As directed    Administer for PICC line occlusion and as ordered by physician for other access device issues.   Anaphylaxis Kit: Provided to treat any anaphylactic reaction to the medication being provided to the patient if First Dose or when requested by physician   Complete by: As directed    Epinephrine 165mml vial / amp: Administer 0.74m45m0.74ml84mubcutaneously once for moderate to severe anaphylaxis, nurse to call physician and pharmacy when reaction occurs and call 911 if needed for immediate care   Diphenhydramine 50mg4mIV vial: Administer 25-50mg 774mM PRN for first dose reaction, rash, itching, mild reaction, nurse to call physician and pharmacy when reaction occurs   Sodium Chloride 0.9% NS 500ml I27mdminister if needed for hypovolemic blood pressure drop or as ordered by physician after call to physician with anaphylactic reaction   Change dressing on IV access line weekly and PRN   Complete by: As directed    Diet - low sodium heart healthy   Complete by: As directed    Discharge wound care:   Complete by: As directed    Cleanse abdominal pannus, inguinal breakdown with soap and water and apply Interdry to skin folds Measure and cut length of InterDry to fit in skin folds that have skin breakdown  Tuck InterDry fabric into skin folds in  a single layer, allow for 2 inches of overhang from skin edges to allow for wicking to occur May remove to bathe; dry area thoroughly and then tuck into affected areas again  Do not apply any creams or  ointments when using InterDry DO NOT THROW AWAY FOR 5 DAYS unless soiled with stool DO NOT [facility] product, this will inactivate the silver in the material  New sheet of Interdry should be applied after 5 days of use if patient continues to have skin breakdown     Apply Gerhardts butt cream twice daily to abdominal lesions and buttocks wounds Expose skin to dermatherapy linen as much as possible. (No disposable briefs or underpads)   Cleanse wounds to legs and feet with NS and pat dry. Apply Xeroform gauze to open wounds.  Cover with dry dressing.   Flush IV access with Sodium Chloride 0.9% and Heparin 10 units/ml or 100 units/ml   Complete by: As directed    Home infusion instructions - [facility]   Complete by: As directed    Instructions: Flush IV access with Sodium Chloride 0.9% and Heparin 10units/ml or 100units/ml   Change dressing on IV access line: Weekly and PRN   Instructions Cath Flo 32m: Administer for PICC Line occlusion and as ordered by physician for other access device   [facility] pharmacist to adjust dose for: Vancomycin, Aminoglycosides and other anti-infective therapies as requested by physician   Increase activity slowly   Complete by: As directed    Method of administration may be changed at the discretion of home infusion pharmacist based upon assessment of the patient and/or caregivers ability to self-administer the medication ordered   Complete by: As directed       Allergies as of 12/19/2021       Reactions   Carvedilol Palpitations   Benicar [olmesartan] Swelling   Codeine Other (See Comments)   Confusion    Sulfa Antibiotics Swelling   Whole face swells   Trulicity [dulaglutide] Diarrhea        Medication List     STOP taking these medications    cefTRIAXone  IVPB Commonly known as: ROCEPHIN   furosemide 20 MG tablet Commonly known as: LASIX   insulin aspart 100 UNIT/ML injection Commonly known as: novoLOG    saccharomyces boulardii 250 MG capsule Commonly known as: Florastor   vancomycin  IVPB       TAKE these medications    albuterol 108 (90 Base) MCG/ACT inhaler Commonly known as: VENTOLIN HFA Inhale 2 puffs into the lungs every 6 (six) hours as needed for wheezing or shortness of breath.   amLODipine 5 MG tablet Commonly known as: NORVASC Take 1 tablet (5 mg total) by mouth daily.   amoxicillin-clavulanate 875-125 MG tablet Commonly known as: Augmentin Take 1 tablet by mouth 2 (two) times daily for 14 days. Start taking on: December 21, 2021   atorvastatin 10 MG tablet Commonly known as: LIPITOR Take 10 mg by mouth at bedtime.   ceFEPime  IVPB Commonly known as: MAXIPIME Inject 2 g into the vein every 12 (twelve) hours for 3 doses. Indication:  osteomyelitis/HCAP First Dose: Yes Last Day of Therapy:  12/20/2020 Labs - Once weekly:  CBC/D and BMP, Labs - Every other week:  ESR and CRP Method of administration: IV Push Method of administration may be changed at the discretion of home infusion pharmacist based upon assessment of the patient and/or caregiver's ability to self-administer the medication ordered.   doxycycline  100 MG capsule Commonly known as: VIBRAMYCIN Take 1 capsule (100 mg total) by mouth 2 (two) times daily for 14 days. Start taking on: December 21, 2021   gabapentin 100 MG capsule Commonly known as: NEURONTIN Take 1 capsule (100 mg total) by mouth 3 (three) times daily.   Gerhardt's butt cream Crea As directed   Glucerna Liqd Take 237 mLs by mouth daily. For wound healing   hydrALAZINE 25 MG tablet Commonly known as: APRESOLINE Take 1 tablet (25 mg total) by mouth every 8 (eight) hours.   hydrOXYzine 10 MG tablet Commonly known as: ATARAX Take 10 mg by mouth every 8 (eight) hours as needed for itching or anxiety.   insulin glargine-yfgn 100 UNIT/ML injection Commonly known as: SEMGLEE Inject 0.4 mLs (40 Units total) into the skin  daily. What changed:  how much to take when to take this   INSULIN LISPRO (1 UNIT DIAL) [place] Inject 12 Units into the skin with breakfast, with lunch, and with evening meal.   magnesium oxide 400 (240 Mg) MG tablet Commonly known as: MAG-OX Take 1 tablet by mouth daily.   nystatin powder Commonly known as: MYCOSTATIN/NYSTOP Apply topically 2 (two) times daily.   omeprazole 20 MG capsule Commonly known as: PRILOSEC Take 20 mg by mouth 2 (two) times daily before a meal.   potassium chloride SA 20 MEQ tablet Commonly known as: KLOR-CON M Take 20 mEq by mouth daily.   propranolol ER 80 MG 24 hr capsule Commonly known as: INDERAL LA Take 80 mg by mouth daily.   sertraline 50 MG tablet Commonly known as: ZOLOFT Take 3 tablets (150 mg total) by mouth daily. What changed: how much to take               Discharge Care Instructions  (From admission, onward)           Start     Ordered   12/19/21 0000  Change dressing on IV access line weekly and PRN  (Home infusion instructions - [facility] )        12/19/21 0929   12/19/21 0000  Discharge wound care:       Comments: Cleanse abdominal pannus, inguinal breakdown with soap and water and apply Interdry to skin folds Measure and cut length of InterDry to fit in skin folds that have skin breakdown  Tuck InterDry fabric into skin folds in a single layer, allow for 2 inches of overhang from skin edges to allow for wicking to occur May remove to bathe; dry area thoroughly and then tuck into affected areas again  Do not apply any creams or ointments when using InterDry DO NOT THROW AWAY FOR 5 DAYS unless soiled with stool DO NOT [facility] product, this will inactivate the silver in the material  New sheet of Interdry should be applied after 5 days of use if patient continues to have skin breakdown     Apply Gerhardts butt cream twice daily to abdominal lesions and buttocks wounds Expose skin to dermatherapy linen as  much as possible. (No disposable briefs or underpads)   Cleanse wounds to legs and feet with NS and pat dry. Apply Xeroform gauze to open wounds.  Cover with dry dressing.   12/19/21 1749            Follow-up Information     [name], [name], NP. Schedule an appointment as soon as possible for a visit in 3 week(s).   Specialty: Adult Health Nurse Practitioner  Contact information: [place] [phone]                Allergies  Allergen Reactions   Carvedilol Palpitations   Benicar [Olmesartan] Swelling   Codeine Other (See Comments)    Confusion    Sulfa Antibiotics Swelling    Whole face swells   Trulicity [Dulaglutide] Diarrhea    Consultations: Discussed case with PCCM   Procedures/Studies: CT ABDOMEN PELVIS WO CONTRAST  Result Date: 11/21/2021 CLINICAL DATA:  Left lower quadrant abdominal pain. EXAM: CT ABDOMEN AND PELVIS WITHOUT CONTRAST TECHNIQUE: Multidetector CT imaging of the abdomen and pelvis was performed following the standard protocol without IV contrast. COMPARISON:  CT abdomen and pelvis 09/24/2017. FINDINGS: Lower chest: No acute abnormality. Hepatobiliary: No focal liver abnormality is seen. Status post cholecystectomy. Pancreas: Atrophic. Spleen: Mildly enlarged, unchanged. Adrenals/Urinary Tract: There is a 16 mm cyst in the right kidney. Otherwise, the adrenal glands, kidneys and bladder are within normal limits. Stomach/Bowel: Stomach is within normal limits. There is distal esophageal wall thickening. Appendix appears normal. No evidence of bowel wall thickening, distention, or inflammatory changes. Vascular/Lymphatic: Aortic atherosclerosis. No enlarged abdominal or pelvic lymph nodes. Reproductive: Uterus and bilateral adnexa are unremarkable. Other: There is no ascites or focal abdominal wall hernia. There is mild body wall edema. There is skin thickening measuring up to 16 mm in the left anterior abdominal  wall. No focal fluid collection or soft tissue gas. Musculoskeletal: Multilevel degenerative changes affect the spine IMPRESSION: 1. Skin thickening in the left anterior abdominal wall. Correlate clinically for infection. No evidence for abscess or soft tissue gas. 2. Mild splenomegaly. 3. Wall thickening of the distal esophagus concerning for esophagitis. Recommend clinical correlation follow-up to exclude underlying lesion. 4.  Aortic Atherosclerosis (ICD10-I70.0). Electronically Signed   By: Ronney Asters M.D.   On: 11/21/2021 21:18   CT Head Wo Contrast  Result Date: 12/16/2021 CLINICAL DATA:  Mental status changes. EXAM: CT HEAD WITHOUT CONTRAST TECHNIQUE: Contiguous axial images were obtained from the base of the skull through the vertex without intravenous contrast. RADIATION DOSE REDUCTION: This exam was performed according to the departmental dose-optimization program which includes automated exposure control, adjustment of the mA and/or kV according to patient size and/or use of iterative reconstruction technique. COMPARISON:  11/26/2021 FINDINGS: Brain: There is atrophy and chronic small vessel disease changes. No acute intracranial abnormality. Specifically, no hemorrhage, hydrocephalus, mass lesion, acute infarction, or significant intracranial injury. Vascular: No hyperdense vessel or unexpected calcification. Skull: No acute calvarial abnormality. Sinuses/Orbits: Mucosal thickening throughout the paranasal sinuses. No air-fluid levels. Other: None IMPRESSION: Atrophy, chronic microvascular disease. No acute intracranial abnormality. Electronically Signed   By: Rolm Baptise M.D.   On: 12/16/2021 01:42   CT HEAD WO CONTRAST (5MM)  Result Date: 11/26/2021 CLINICAL DATA:  Mental status change, unknown cause EXAM: CT HEAD WITHOUT CONTRAST TECHNIQUE: Contiguous axial images were obtained from the base of the skull through the vertex without intravenous contrast. COMPARISON:  12/26/2019 FINDINGS:  Brain: There is no acute intracranial hemorrhage, mass effect, or edema. Gray-white differentiation is preserved. There is no extra-axial fluid collection. Prominence of the ventricles and sulci reflects stable parenchymal volume loss. Patchy hypoattenuation in the supratentorial white matter is nonspecific but probably reflects stable chronic microvascular ischemic changes. Vascular: There is atherosclerotic calcification at the skull base. Skull: Calvarium is unremarkable. Sinuses/Orbits: Paranasal sinus mucosal thickening. Lens replacements. Other: Mild left scalp soft tissue swelling. IMPRESSION: No acute intracranial abnormality. Stable chronic  findings detailed above. Electronically Signed   By: Macy Mis M.D.   On: 11/26/2021 15:17   CT CHEST WO CONTRAST  Result Date: 12/16/2021 CLINICAL DATA:  Pneumonia EXAM: CT CHEST WITHOUT CONTRAST TECHNIQUE: Multidetector CT imaging of the chest was performed following the standard protocol without IV contrast. RADIATION DOSE REDUCTION: This exam was performed according to the departmental dose-optimization program which includes automated exposure control, adjustment of the mA and/or kV according to patient size and/or use of iterative reconstruction technique. COMPARISON:  Previous CT done on 09/02/2008 chest radiograph done on 12/15/2021 FINDINGS: Cardiovascular: Coronary artery calcifications are seen. There is dense calcification in the mitral annulus. Heart is enlarged in size. There is ectasia of main pulmonary artery measuring 3.6 cm. Mediastinum/Nodes: There are subcentimeter nodes in the mediastinum. Lungs/Pleura: There is interval improvement in aeration in the right upper lobe. Extensive patchy alveolar and ground-glass infiltrates are seen in both lungs more so on the right side. Small bilateral pleural effusions are seen, more so on the right side. Upper Abdomen: Surgical clips are seen in gallbladder fossa. Spleen is enlarged measuring 16.6 cm in  AP diameter. Musculoskeletal: Unremarkable. IMPRESSION: There are extensive scattered patchy alveolar and ground-glass infiltrates in both lungs, more so on the right side. Findings suggest multifocal pneumonia in both lungs. Part of this finding may suggest underlying pulmonary edema. Bilateral pleural effusions, more so on the right side. There is interval improvement in aeration of right upper lobe, possibly suggesting interval resolution of atelectasis. Coronary artery calcifications are seen. There is ectasia of main pulmonary artery suggesting pulmonary arterial hypertension. Splenomegaly. Electronically Signed   By: Elmer Picker M.D.   On: 12/16/2021 14:42   CT FOOT LEFT W CONTRAST  Result Date: 11/23/2021 CLINICAL DATA:  Foot swelling, diabetic osteomyelitis suspected. EXAM: CT OF THE LOWER LEFT EXTREMITY WITH CONTRAST TECHNIQUE: Multidetector CT imaging of the lower left extremity was performed according to the standard protocol following intravenous contrast administration. CONTRAST:  24m OMNIPAQUE IOHEXOL 300 MG/ML  SOLN COMPARISON:  None. FINDINGS: Bones/Joint/Cartilage No cortical erosion or periosteal reaction. No evidence of fracture or dislocation. Degenerative changes at the talonavicular and calcaneonavicular joint space subchondral cystic changes and minimal osteophytes. Subtalar and tibiotalar joints are intact. Ligaments Suboptimally assessed by CT. Muscles and Tendons Muscles are normal in bulk. No evidence of intramuscular fluid collection or abscess. Soft tissues There is marked skin thickening and subcutaneous soft tissue edema about the distal aspect of the foot. There is a deep skin wound about the plantar aspect of the first metatarsophalangeal joint without evidence of drainable fluid collection or abscess. IMPRESSION: 1. Skin thickening and subcutaneous soft tissue edema about the forefoot consistent with cellulitis. 2. Deep skin wound about the plantar aspect of the first  metatarsophalangeal joint. No cortical erosion or periosteal reaction concerning for osteomyelitis. Evaluation of early osteomyelitis is however limited on CT scans. If there is persistent clinical concern MR examination could be obtained for further evaluation. Electronically Signed   By: IKeane PoliceD.O.   On: 11/23/2021 16:30   CT TIBIA FIBULA RIGHT W CONTRAST  Result Date: 11/23/2021 CLINICAL DATA:  Leg swelling and sores. EXAM: CT OF THE LOWER RIGHT EXTREMITY WITH CONTRAST TECHNIQUE: Multidetector CT imaging of the lower right extremity was performed according to the standard protocol following intravenous contrast administration. CONTRAST:  772mOMNIPAQUE IOHEXOL 300 MG/ML  SOLN COMPARISON:  75 cc Omnipaque 300 FINDINGS: Bones/Joint/Cartilage Knee osteoarthritis especially affecting the patellofemoral compartment. No bony destructive findings in  the tibial or fibular characteristic of osteomyelitis. Small knee effusion without a substantial degree of synovitis. Posterior meniscal chondrocalcinosis versus free osteochondral fragment along the posterior horn medial meniscus. Plantar and Achilles calcaneal spurs. Accessory navicular noted. Small Baker's cyst noted. Ligaments Suboptimally assessed by CT. Muscles and Tendons No intramuscular abscess or hematoma. Soft tissues Subcutaneous edema at the level of the knee is primarily anterior and lateral. This tracks distally in the calf and is more confluent in the distal calf and in the ankle region where the appearance is more circumferential. The cause of this subcutaneous edema is nonspecific. No subcutaneous abscess is present. There are scattered irregular subcutaneous calcifications compatible with venous insufficiency. Atherosclerotic calcification of arterial vascular structures also noted. Today's exam is not a CT angiogram and does not adequately assess patency of the arterial vessels distal to the popliteal artery. I do not see a definite large  cutaneous ulceration or gas tracking in the subcutaneous tissues. IMPRESSION: 1. No findings of osteomyelitis or drainable abscess. 2. Subcutaneous edema especially in the distal calf and ankle region. Cellulitis not excluded. No gas in the soft tissues. 3. Subcutaneous calcifications along the calf compatible with chronic venous insufficiency. 4. Small knee effusion and small Baker's cyst. No substantial synovitis. 5. Posterior horn medial meniscus chondrocalcinosis versus small adjacent free osteochondral fragment. 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 6. Plantar and Achilles calcaneal spurs. Electronically Signed   By: Van Clines M.D.   On: 11/23/2021 16:54   MR FOOT RIGHT WO CONTRAST  Result Date: 11/23/2021 CLINICAL DATA:  Foot swelling, diabetic, osteomyelitis suspected. No prior imaging. EXAM: MRI OF THE RIGHT FOREFOOT WITHOUT CONTRAST TECHNIQUE: Multiplanar, multisequence MR imaging of the right was performed. No intravenous contrast was administered. COMPARISON:  Radiographs dated November 21, 2021. FINDINGS: Bones/Joint/Cartilage No fracture or dislocation. Normal alignment. No joint effusion. No marrow signal abnormality. Ligaments Collateral ligaments are intact.  Lisfranc ligament is intact. Muscles and Tendons Flexor, peroneal and extensor compartment tendons are intact. 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 No drainable fluid collection or abscess. Soft tissue Skin thickening and prominent subcutaneous soft tissue edema about the dorsal and plantar aspect of the forefoot. No drainable fluid collection or abscess. No soft tissue mass. IMPRESSION: 1.  No MR evidence of acute osteomyelitis. 2. Skin thickening and subcutaneous soft tissue edema consistent with cellulitis. No drainable fluid collection or abscess. Electronically Signed   By: Keane Police D.O.   On: 11/23/2021 11:45   MR FOOT LEFT WO  CONTRAST  Result Date: 11/23/2021 CLINICAL DATA:  Foot swelling, diabetic osteomyelitis suspected. EXAM: MRI OF THE LEFT FOOT WITHOUT CONTRAST TECHNIQUE: Multiplanar, multisequence MR imaging of the left was performed. No intravenous contrast was administered. COMPARISON:  Radiographs dated November 21, 2021 FINDINGS: Nondiagnostic examination due to significant motion. Subcutaneous soft tissue edema. IMPRESSION: Nondiagnostic examination due to motion. Subcutaneous soft tissue edema. Electronically Signed   By: Keane Police D.O.   On: 11/23/2021 11:37   MR FOOT LEFT W WO CONTRAST  Result Date: 12/16/2021 CLINICAL DATA:  Foot swelling, nondiabetic, osteomyelitis suspected. Diabetic ulcer plantar aspect of left foot 1st MTP joint area. EXAM: MRI OF THE LEFT FOREFOOT WITHOUT AND WITH CONTRAST TECHNIQUE: Multiplanar, multisequence MR imaging of the left forefoot was performed both before and after administration of intravenous contrast. CONTRAST:  26m GADAVIST GADOBUTROL 1 MMOL/ML IV SOLN COMPARISON:  Left foot radiographs 11/06/2021 11/21/2021 MRI left forefoot 11/23/2021, CT left foot 11/23/2021 FINDINGS:  Unfortunately, there is moderate to high-grade patient motion artifact that technically degrades this examination. Bones/Joint/Cartilage Motion artifact markedly technically limits evaluation for possible cortical erosion. In the area of interest of the plantar ulcer at the level of the great toe metatarsophalangeal joint there is a bipartite medial great toe metatarsophalangeal joint sesamoid with diffuse marrow edema on both sides of the synchondrosis. This marrow edema may be secondary to stress related changes. No definite cortical erosion is seen to indicate acute osteomyelitis. There are moderate subchondral cysts with moderate cartilage thinning at the navicular-medial and navicular-intermediate cuneiform articulations. No acute fracture. Ligaments The Lisfranc ligament complex is grossly intact. The  plantar plates are grossly intact. Muscles and Tendons Mild fluid around the flexor tendons of the midfoot, nonspecific. Mild edema within the abductor hallucis muscle body with mild associated enhancement. This suggest a nonspecific myositis. Soft tissues There is again thinning of the superficial aspect of the subcutaneous fat plantar to the metatarsophalangeal joint and proximal aspect of the proximal phalanx of the great toe, an ulcer measuring up to approximately 1.5 cm in transverse dimension and 2.0 cm in AP dimension with a depth of 0.5 cm. IMPRESSION:: IMPRESSION: 1. There is again a chronic soft tissue ulcer at the plantar aspect of the great toe metatarsophalangeal joint and proximal phalanx. 2. There is marrow edema throughout both sides of a bipartite medial great toe metatarsophalangeal joint sesamoid close to the ulcer, however this edema may represent stress reactive change. No definitive cortical erosion is seen to indicate acute osteomyelitis, however it is difficult to completely exclude osteomyelitis given the relative proximity to the chronic soft tissue ulcer. 3. There is enhancement and edema within the abductor hallucis muscle that extends posteriorly from the region of the plantar medial forefoot soft tissue ulcer. This is nonspecific. This may represent bland versus infected myositis. 4. Mild-to-moderate degenerative changes of the navicular-medial and navicular-intermediate cuneiform articulations. Electronically Signed   By: Yvonne Kendall   On: 12/16/2021 15:50   US Abdomen Limited  Result Date: 11/22/2021 CLINICAL DATA:  Elevated liver enzymes. Gallbladder surgically absent. EXAM: ULTRASOUND ABDOMEN LIMITED RIGHT UPPER QUADRANT COMPARISON:  CT abdomen and pelvis, 11/21/2021. FINDINGS: Gallbladder: Surgically absent. Common bile duct: Diameter: 4 mm Liver: Suboptimally visualized. Normal overall size. No convincing mass. Portal vein is patent on color Doppler imaging with normal  direction of blood flow towards the liver. Other: None. IMPRESSION: 1. Significantly limited study due to the patient's body habitus. 2. No acute findings. No bile duct dilation. Liver not well assessed, better visualized on the previous day's CT. Electronically Signed   By: Lajean Manes M.D.   On: 11/22/2021 09:01   US Venous Img Lower Bilateral (DVT)  Result Date: 11/23/2021 CLINICAL DATA:  Bilateral lower extremity chronic pain and edema EXAM: BILATERAL LOWER EXTREMITY VENOUS DOPPLER ULTRASOUND TECHNIQUE: Gray-scale sonography with graded compression, as well as color Doppler and duplex ultrasound were performed to evaluate the lower extremity deep venous systems from the level of the common femoral vein and including the common femoral, femoral, profunda femoral, popliteal and calf veins including the posterior tibial, peroneal and gastrocnemius veins when visible. The superficial great saphenous vein was also interrogated. Spectral Doppler was utilized to evaluate flow at rest and with distal augmentation maneuvers in the common femoral, femoral and popliteal veins. COMPARISON:  None. FINDINGS: RIGHT LOWER EXTREMITY Common Femoral Vein: No evidence of thrombus. Normal compressibility, respiratory phasicity and response to augmentation. Saphenofemoral Junction: No evidence of thrombus. Normal compressibility and flow  on color Doppler imaging. Profunda Femoral Vein: No evidence of thrombus. Normal compressibility and flow on color Doppler imaging. Femoral Vein: No evidence of thrombus. Normal compressibility, respiratory phasicity and response to augmentation. Popliteal Vein: No evidence of thrombus. Normal compressibility, respiratory phasicity and response to augmentation. Calf Veins: No evidence of thrombus. Normal compressibility and flow on color Doppler imaging. Other Findings: Right popliteal fossa minimally complex thick-walled Baker's cyst measures 4.5 x 1.4 x 2.9 cm LEFT LOWER EXTREMITY Common  Femoral Vein: No evidence of thrombus. Normal compressibility, respiratory phasicity and response to augmentation. Saphenofemoral Junction: No evidence of thrombus. Normal compressibility and flow on color Doppler imaging. Profunda Femoral Vein: No evidence of thrombus. Normal compressibility and flow on color Doppler imaging. Femoral Vein: No evidence of thrombus. Normal compressibility, respiratory phasicity and response to augmentation. Popliteal Vein: No evidence of thrombus. Normal compressibility, respiratory phasicity and response to augmentation. Calf Veins: No evidence of thrombus. Normal compressibility and flow on color Doppler imaging. IMPRESSION: No significant DVT demonstrated in either extremity. Limited exam because of obesity. 4.5 cm right knee Baker's cyst. Electronically Signed   By: Jerilynn Mages.  Shick M.D.   On: 11/23/2021 14:10   US ARTERIAL ABI (SCREENING LOWER EXTREMITY)  Result Date: 11/23/2021 CLINICAL DATA:  61 year old female with a history bilateral wounds EXAM: NONINVASIVE PHYSIOLOGIC VASCULAR STUDY OF BILATERAL LOWER EXTREMITIES TECHNIQUE: Evaluation of both lower extremities was performed at rest, including calculation of ankle-brachial indices, multiple segmental pressure evaluation, segmental Doppler and segmental pulse volume recording. COMPARISON:  None. FINDINGS: Right ABI:  0.96 Left ABI:  1.35 Right Lower Extremity: Segmental Doppler at the right ankle demonstrates monophasic waveforms Left Lower Extremity: Segmental Doppler at the left ankle demonstrates monophasic waveforms IMPRESSION: Resting ABI the bilateral lower extremity within normal limits, though could be falsely elevated given the segmental exam. Segmental Doppler at the bilateral ankles demonstrates monophasic waveforms. Signed, Dulcy Fanny. Dellia Nims, RPVI [facility] Electronically Signed   By: Corrie Mckusick D.O.   On: 11/23/2021 11:21   DG Chest Portable 1  View  Result Date: 12/15/2021 CLINICAL DATA:  sob EXAM: PORTABLE CHEST 1 VIEW COMPARISON:  Chest x-ray 11/26/2021, CT chest 09/01/2008 FINDINGS: Enlarged cardiac silhouette. The heart and mediastinal contours are unchanged. Complete opacification of the right upper lobe. Patchy airspace opacity within the right middle and lower lobes. No pulmonary edema. Likely trace bilateral pleural effusions. No pneumothorax. No acute osseous abnormality. IMPRESSION: 1. Complete opacification of the right upper lobe. Patchy airspace opacity within the right middle and lower lobes. Findings suggestive of multifocal pneumonia. Followup PA and lateral chest X-ray is recommended in 3-4 weeks following therapy to ensure resolution. 2. Likely trace bilateral pleural effusions. Electronically Signed   By: Iven Finn M.D.   On: 12/15/2021 22:55   DG CHEST PORT 1 VIEW  Result Date: 11/26/2021 CLINICAL DATA:  Hypoxemia with weakness and shortness of breath. EXAM: PORTABLE CHEST 1 VIEW COMPARISON:  Radiographs 12/26/2019 and 09/25/2019.  CT 09/02/2008. FINDINGS: 1408 hours. The heart size and mediastinal contours are stable allowing for portable semi erect technique and lordotic positioning. The lungs appear clear. There is no pleural effusion or pneumothorax. A left-sided PICC projects to the level of the azygous vein. No osseous abnormalities are identified. IMPRESSION: Stable mild cardiomegaly. No acute cardiopulmonary process identified. Electronically Signed   By: Richardean Sale M.D.   On: 11/26/2021 14:26   DG Foot Complete Left  Result Date: 11/21/2021 CLINICAL DATA:  Wounds to  both feet x2 weeks. EXAM: LEFT FOOT - COMPLETE 3+ VIEW COMPARISON:  November 06, 2021 FINDINGS: There is no evidence of fracture or dislocation. There is no evidence of arthropathy or other focal bone abnormality. A 1.2 cm superficial soft tissue ulceration is seen along the plantar aspect of the distal left foot. Moderate severity soft tissue  swelling is also seen within this region. IMPRESSION: Plantar soft tissue ulceration and associated soft tissue swelling, as described above, without an acute osseous abnormality. Electronically Signed   By: Virgina Norfolk M.D.   On: 11/21/2021 21:33   DG Foot Complete Right  Result Date: 11/21/2021 CLINICAL DATA:  Wounds to both feet x2 weeks. EXAM: RIGHT FOOT COMPLETE - 3+ VIEW COMPARISON:  November 06, 2021 FINDINGS: There is no evidence of fracture or dislocation. There is no evidence of arthropathy or other focal bone abnormality. Mild to moderate severity vascular calcification is seen. Mild soft tissue swelling is seen along the plantar aspect of the distal right foot. IMPRESSION: Plantar soft tissue swelling, as described above, without an acute osseous abnormality. Electronically Signed   By: Virgina Norfolk M.D.   On: 11/21/2021 21:31   IR PICC PLACEMENT LEFT >5 YRS INC IMG GUIDE  Result Date: 11/25/2021 INDICATION: Patient with history of osteomyelitis left foot, cellulitis requiring long-term IV antibiotic therapy. Request to IR for PICC placement for durable venous access. EXAM: LEFT UPPER EXTREMITY PICC LINE PLACEMENT WITH ULTRASOUND AND FLUOROSCOPIC GUIDANCE MEDICATIONS: 3 mL 1% lidocaine ANESTHESIA/SEDATION: None. FLUOROSCOPY TIME:  Fluoroscopy Time: 0 minutes 24 seconds (16.5 mGy). COMPLICATIONS: None immediate. PROCEDURE: The patient was advised of the possible risks and complications and agreed to undergo the procedure. The patient was then brought to the angiographic suite for the procedure. The left arm was prepped with chlorhexidine, draped in the usual sterile fashion using maximum barrier technique (cap and mask, sterile gown, sterile gloves, large sterile sheet, hand hygiene and cutaneous antisepsis) and infiltrated locally with 1% Lidocaine. Ultrasound demonstrated patency of the left basilic vein, and this was documented with an image. Under real-time ultrasound guidance,  this vein was accessed with a 21 gauge micropuncture needle and image documentation was performed. A 0.018 wire was introduced in to the vein. Over this, a 5 [place] single lumen power injectable PICC was advanced to the lower SVC/right atrial junction. Fluoroscopy during the procedure and fluoro spot radiograph confirms appropriate catheter position. The catheter was flushed, aspirated, and covered with a sterile dressing. Catheter length: 42 cm IMPRESSION: Successful left arm power PICC line placement with ultrasound and fluoroscopic guidance. The catheter is ready for use. Read by Candiss Norse, PA-C Electronically Signed   By: Ruthann Cancer M.D.   On: 11/25/2021 13:54   [place] EKG SITE RITE  Result Date: 11/23/2021 If Site Rite image not attached, placement could not be confirmed due to current cardiac rhythm.    Discharge Exam: Vitals:   12/18/21 2308 12/19/21 0452  BP:  (!) 151/65  Pulse: 61 (!) 58  Resp: 16 18  Temp:  98 F (36.7 C)  SpO2: 92% 93%   Vitals:   12/18/21 2045 12/18/21 2109 12/18/21 2308 12/19/21 0452  BP: 139/64   (!) 151/65  Pulse: 64 63 61 (!) 58  Resp: '20 20 16 18  ' Temp: 98.4 F (36.9 C)   98 F (36.7 C)  TempSrc: Oral   Oral  SpO2:  96% 92% 93%  Weight:      Height:  General: Pt is alert, awake, not in acute distress, morbidly obese Cardiovascular: RRR, S1/S2 +, no rubs, no gallops Respiratory: CTA bilaterally, no wheezing, no rhonchi Abdominal: Soft, NT, ND, bowel sounds + Extremities: no edema, no cyanosis Skin with multiple wounds    The results of significant diagnostics from this hospitalization (including imaging, microbiology, ancillary and laboratory) are listed below for reference.     Microbiology: Recent Results (from the past 240 hour(s))  Blood culture (routine x 2)     Status: None (Preliminary result)   Collection Time: 12/15/21 10:32 PM   Specimen: BLOOD RIGHT FOREARM  Result Value Ref Range Status   Specimen Description  BLOOD RIGHT FOREARM  Final   Special Requests   Final    BOTTLES DRAWN AEROBIC AND ANAEROBIC Blood Culture adequate volume   Culture   Final    NO GROWTH 4 DAYS Performed at [facility], [address] [place], [place] [address]    Report Status PENDING  Incomplete  Blood culture (routine x 2)     Status: None (Preliminary result)   Collection Time: 12/15/21 11:21 PM   Specimen: BLOOD LEFT HAND  Result Value Ref Range Status   Specimen Description BLOOD LEFT HAND  Final   Special Requests   Final    BOTTLES DRAWN AEROBIC AND ANAEROBIC Blood Culture adequate volume   Culture   Final    NO GROWTH 4 DAYS Performed at [facility], [address], [place], [place] [address]    Report Status PENDING  Incomplete  Resp Panel by RT-PCR (Flu A&B, Covid) Nasopharyngeal Swab     Status: Abnormal   Collection Time: 12/16/21 12:20 AM   Specimen: Nasopharyngeal Swab; Nasopharyngeal(NP) swabs in vial transport medium  Result Value Ref Range Status   SARS Coronavirus 2 by RT PCR POSITIVE (A) NEGATIVE Final    Comment: (NOTE) SARS-CoV-2 target nucleic acids are DETECTED.  The SARS-CoV-2 RNA is generally detectable in upper respiratory specimens during the acute phase of infection. Positive results are indicative of the presence of the identified virus, but do not rule out bacterial infection or co-infection with other pathogens not detected by the test. Clinical correlation with patient history and other diagnostic information is necessary to determine patient infection status. The expected result is Negative.  Fact Sheet for Patients: EntrepreneurPulse.com.au  Fact Sheet for Healthcare Providers: IncredibleEmployment.be  This test is not yet approved or cleared by the [place] FDA and  has been authorized for detection and/or diagnosis of SARS-CoV-2 by FDA under an Emergency Use Authorization (EUA).  This EUA will remain in effect (meaning this  test can be used) for the duration of  the COVID-19 declaration under Section 564(b)(1) of the A ct, 21 U.S.C. section 360bbb-3(b)(1), unless the authorization is terminated or revoked sooner.     Influenza A by PCR NEGATIVE NEGATIVE Final   Influenza B by PCR NEGATIVE NEGATIVE Final    Comment: (NOTE) The Xpert Xpress SARS-CoV-2/FLU/RSV plus assay is intended as an aid in the diagnosis of influenza from Nasopharyngeal swab specimens and should not be used as a sole basis for treatment. Nasal washings and aspirates are unacceptable for Xpert Xpress SARS-CoV-2/FLU/RSV testing.  Fact Sheet for Patients: EntrepreneurPulse.com.au  Fact Sheet for Healthcare Providers: IncredibleEmployment.be  This test is not yet approved or cleared by the [place] FDA and has been authorized for detection and/or diagnosis of SARS-CoV-2 by FDA under an Emergency Use Authorization (EUA). This EUA will remain in effect (meaning this test  can be used) for the duration of the COVID-19 declaration under Section 564(b)(1) of the Act, 21 U.S.C. section 360bbb-3(b)(1), unless the authorization is terminated or revoked.  Performed at [facility], [address], [place], [place] [address]   MRSA Next Gen by PCR, Nasal     Status: None   Collection Time: 12/16/21  1:45 AM   Specimen: Nasal Mucosa; Nasal Swab  Result Value Ref Range Status   MRSA by PCR Next Gen NOT DETECTED NOT DETECTED Final    Comment: (NOTE) The GeneXpert MRSA Assay (FDA approved for NASAL specimens only), is one component of a comprehensive MRSA colonization surveillance program. It is not intended to diagnose MRSA infection nor to guide or monitor treatment for MRSA infections. Test performance is not FDA approved in patients less than 65 years old. Performed at [facility], [address], [place], [place] [address]   Urine Culture     Status: Abnormal   Collection Time: 12/17/21 12:20 PM    Specimen: Urine, Catheterized  Result Value Ref Range Status   Specimen Description   Final    URINE, CATHETERIZED Performed at [facility], [address] [place], [place] [address]    Special Requests   Final    NONE Performed at [facility], [address], [place], [place] [address]    Culture (A)  Final    <10,000 COLONIES/mL INSIGNIFICANT GROWTH Performed at [facility] [address], [place], [place] [address]    Report Status 12/18/2021 FINAL  Final  Expectorated Sputum Assessment w Gram Stain, Rflx to Resp Cult     Status: None   Collection Time: 12/17/21 12:42 PM   Specimen: Sputum  Result Value Ref Range Status   Specimen Description SPUTUM  Final   Special Requests NONE  Final   Sputum evaluation   Final    THIS SPECIMEN IS ACCEPTABLE FOR SPUTUM CULTURE Performed at [facility], [address], [place], [place] [address]    Report Status 12/17/2021 FINAL  Final  Culture, Respiratory w Gram Stain     Status: None (Preliminary result)   Collection Time: 12/17/21 12:42 PM   Specimen: SPU  Result Value Ref Range Status   Specimen Description   Final    SPUTUM Performed at [facility], [address], [place], [place] [address]    Special Requests   Final    NONE Reflexed from [id number] Performed at [facility], [address], [place], [place] [address]    Gram Stain   Final    NO SQUAMOUS EPITHELIAL CELLS SEEN FEW WBC SEEN FEW GRAM POSITIVE COCCI    Culture   Final    CULTURE REINCUBATED FOR BETTER GROWTH Performed at [facility], [place] [address] [place],  [address]    Report Status PENDING  Incomplete     Labs: BNP (last 3 results) No results for input(s): BNP in the last 8760 hours. Basic Metabolic Panel: Recent Labs  Lab 12/15/21 2226 12/16/21 0358 12/17/21 0336 12/18/21 0413 12/19/21 0507  NA 136 136 136 136 138  K 2.9* 3.6 3.5 3.4* 3.4*  CL 94* 96* 96* 98 101  CO2 33* 33* '28 30 30  ' GLUCOSE <20* 110* 313* 251* 183*   BUN 17 19 26* 35* 37*  CREATININE 1.21* 1.30* 1.51* 1.64* 1.50*  CALCIUM 7.9* 7.7* 7.8* 8.2* 8.4*  MG 1.5* 1.6* 1.8 1.9 1.9   Liver Function Tests: Recent Labs  Lab 12/15/21 2226 12/16/21 0358  AST 25 21  ALT 17 13  ALKPHOS 127* 104  BILITOT 0.2* 0.4  PROT 6.7 5.9*  ALBUMIN 2.4* 2.1*   No results for input(s): LIPASE, AMYLASE in the last 168 hours. No results for input(s): AMMONIA in the last 168 hours. CBC: Recent Labs  Lab 12/15/21 2226 12/16/21 0358 12/17/21 0336 12/18/21 0413 12/19/21 0507  WBC 4.9 5.8 5.0 7.5 8.4  NEUTROABS 3.8 5.2  --   --   --   HGB 12.5 11.1* 10.7* 11.3* 10.4*  HCT 39.4 34.7* 33.3* 34.3* 32.1*  MCV 80.2 80.3 77.8* 79.0* 79.7*  PLT 263 220 243 255 223   Cardiac Enzymes: No results for input(s): CKTOTAL, CKMB, CKMBINDEX, TROPONINI in the last 168 hours. BNP: Invalid input(s): POCBNP CBG: Recent Labs  Lab 12/18/21 1740 12/18/21 2044 12/18/21 2330 12/19/21 0449 12/19/21 0723  GLUCAP 264* 246* 217* 186* 179*   D-Dimer No results for input(s): DDIMER in the last 72 hours. Hgb A1c No results for input(s): HGBA1C in the last 72 hours. Lipid Profile No results for input(s): CHOL, HDL, LDLCALC, TRIG, CHOLHDL, LDLDIRECT in the last 72 hours. Thyroid function studies No results for input(s): TSH, T4TOTAL, T3FREE, THYROIDAB in the last 72 hours.  Invalid input(s): FREET3 Anemia work up No results for input(s): VITAMINB12, FOLATE, FERRITIN, TIBC, IRON, RETICCTPCT in the last 72 hours. Urinalysis    Component Value Date/Time   COLORURINE YELLOW 12/17/2021 1220   APPEARANCEUR CLEAR 12/17/2021 1220   LABSPEC 1.017 12/17/2021 1220   PHURINE 5.0 12/17/2021 1220   GLUCOSEU 150 (A) 12/17/2021 1220   HGBUR NEGATIVE 12/17/2021 1220   BILIRUBINUR NEGATIVE 12/17/2021 1220   KETONESUR 5 (A) 12/17/2021 1220   PROTEINUR 100 (A) 12/17/2021 1220   UROBILINOGEN 0.2 05/15/2015 0112   NITRITE NEGATIVE 12/17/2021 1220   LEUKOCYTESUR NEGATIVE  12/17/2021 1220   Sepsis Labs Invalid input(s): PROCALCITONIN,  WBC,  LACTICIDVEN Microbiology Recent Results (from the past 240 hour(s))  Blood culture (routine x 2)     Status: None (Preliminary result)   Collection Time: 12/15/21 10:32 PM   Specimen: BLOOD RIGHT FOREARM  Result Value Ref Range Status   Specimen Description BLOOD RIGHT FOREARM  Final   Special Requests   Final    BOTTLES DRAWN AEROBIC AND ANAEROBIC Blood Culture adequate volume   Culture   Final    NO GROWTH 4 DAYS Performed at [facility], [address] [place], [place] [address]    Report Status PENDING  Incomplete  Blood culture (routine x 2)     Status: None (Preliminary result)   Collection Time: 12/15/21 11:21 PM   Specimen: BLOOD LEFT HAND  Result Value Ref Range Status   Specimen Description BLOOD LEFT HAND  Final   Special Requests   Final    BOTTLES DRAWN AEROBIC AND ANAEROBIC Blood Culture adequate volume   Culture   Final    NO GROWTH 4 DAYS Performed at [facility], [address], [place],  [address]    Report Status PENDING  Incomplete  Resp Panel by RT-PCR (Flu A&B, Covid) Nasopharyngeal Swab     Status: Abnormal   Collection Time: 12/16/21 12:20 AM   Specimen: Nasopharyngeal Swab; Nasopharyngeal(NP) swabs in vial transport medium  Result Value Ref Range Status   SARS Coronavirus 2 by RT PCR POSITIVE (A) NEGATIVE Final    Comment: (NOTE) SARS-CoV-2 target nucleic acids are DETECTED.  The SARS-CoV-2 RNA is generally detectable in upper respiratory specimens during the acute phase of infection.  Positive results are indicative of the presence of the identified virus, but do not rule out bacterial infection or co-infection with other pathogens not detected by the test. Clinical correlation with patient history and other diagnostic information is necessary to determine patient infection status. The expected result is Negative.  Fact Sheet for  Patients: EntrepreneurPulse.com.au  Fact Sheet for Healthcare Providers: IncredibleEmployment.be  This test is not yet approved or cleared by the [place] FDA and  has been authorized for detection and/or diagnosis of SARS-CoV-2 by FDA under an Emergency Use Authorization (EUA).  This EUA will remain in effect (meaning this test can be used) for the duration of  the COVID-19 declaration under Section 564(b)(1) of the A ct, 21 U.S.C. section 360bbb-3(b)(1), unless the authorization is terminated or revoked sooner.     Influenza A by PCR NEGATIVE NEGATIVE Final   Influenza B by PCR NEGATIVE NEGATIVE Final    Comment: (NOTE) The Xpert Xpress SARS-CoV-2/FLU/RSV plus assay is intended as an aid in the diagnosis of influenza from Nasopharyngeal swab specimens and should not be used as a sole basis for treatment. Nasal washings and aspirates are unacceptable for Xpert Xpress SARS-CoV-2/FLU/RSV testing.  Fact Sheet for Patients: EntrepreneurPulse.com.au  Fact Sheet for Healthcare Providers: IncredibleEmployment.be  This test is not yet approved or cleared by the [place] FDA and has been authorized for detection and/or diagnosis of SARS-CoV-2 by FDA under an Emergency Use Authorization (EUA). This EUA will remain in effect (meaning this test can be used) for the duration of the COVID-19 declaration under Section 564(b)(1) of the Act, 21 U.S.C. section 360bbb-3(b)(1), unless the authorization is terminated or revoked.  Performed at [facility], [address], [place], [place] [address]   MRSA Next Gen by PCR, Nasal     Status: None   Collection Time: 12/16/21  1:45 AM   Specimen: Nasal Mucosa; Nasal Swab  Result Value Ref Range Status   MRSA by PCR Next Gen NOT DETECTED NOT DETECTED Final    Comment: (NOTE) The GeneXpert MRSA Assay (FDA approved for NASAL specimens only), is one component of a  comprehensive MRSA colonization surveillance program. It is not intended to diagnose MRSA infection nor to guide or monitor treatment for MRSA infections. Test performance is not FDA approved in patients less than 34 years old. Performed at [facility], [address] [place], [place] [address]   Urine Culture     Status: Abnormal   Collection Time: 12/17/21 12:20 PM   Specimen: Urine, Catheterized  Result Value Ref Range Status   Specimen Description   Final    URINE, CATHETERIZED Performed at [facility], [address] [place], [place] [address]    Special Requests   Final    NONE Performed at [facility], [address], [place], [place] [address]    Culture (A)  Final    <10,000 COLONIES/mL INSIGNIFICANT GROWTH Performed at [facility] [address], [place], [place] [address]    Report Status 12/18/2021 FINAL  Final  Expectorated Sputum Assessment w Gram Stain, Rflx to Resp Cult     Status: None   Collection Time: 12/17/21 12:42 PM   Specimen: Sputum  Result Value Ref Range Status   Specimen Description SPUTUM  Final   Special Requests NONE  Final   Sputum evaluation   Final    THIS SPECIMEN IS ACCEPTABLE FOR SPUTUM CULTURE Performed at [facility], [address] [place],  [address]    Report  Status 12/17/2021 FINAL  Final  Culture, Respiratory w Gram Stain     Status: None (Preliminary result)   Collection Time: 12/17/21 12:42 PM   Specimen: SPU  Result Value Ref Range Status   Specimen Description   Final    SPUTUM Performed at [facility], [address], [place], [place] [address]    Special Requests   Final    NONE Reflexed from [id number] Performed at [facility], [address] [place], [place] [address]    Gram Stain   Final    NO SQUAMOUS EPITHELIAL CELLS SEEN FEW WBC SEEN FEW GRAM POSITIVE COCCI    Culture   Final    CULTURE REINCUBATED FOR BETTER GROWTH Performed at [facility], [place] [address] [place], [place] [address]     Report Status PENDING  Incomplete     Time coordinating discharge: 35 minutes  SIGNED:   Rodena Goldmann, DO [facility] 12/19/2021, 9:29 AM  If 7PM-7AM, please contact night-coverage [email or web address]

## 2021-12-19 NOTE — TOC Transition Note (Signed)
Transition of Care [facility]) - CM/SW Discharge Note   Patient Details  Name: EDA MAGNUSSEN MRN: [id number] Date of Birth: 22-Jan-1961  Transition of Care [facility]) CM/SW Contact:  Shade Flood, LCSW Phone Number: 12/19/2021, 9:59 AM   Clinical Narrative:     Pt stable for dc back to [facility] today per MD. Insurance has authorized SNF. DC clinical sent electronically. RN to call report. Pt will transfer with EMS.   Updated pt's husband. There are no other TOC needs for dc.  Final next level of care: Skilled Nursing Facility Barriers to Discharge: Barriers Resolved   Patient Goals and CMS Choice Patient states their goals for this hospitalization and ongoing recovery are:: get better      Discharge Placement                Patient to be transferred to facility by: EMS Name of family member notified: Edsel Patient and family notified of of transfer: 12/19/21  Discharge Plan and Services                                     Social Determinants of Health (SDOH) Interventions     Readmission Risk Interventions Readmission Risk Prevention Plan 12/17/2021 11/23/2021  Transportation Screening Complete Complete  Home Care Screening Complete -  Medication Review (RN CM) Complete Complete  Some recent data might be hidden

## 2021-12-19 NOTE — Progress Notes (Signed)
Called report to [place] at [facility]. EMS picked up pt to transport.

## 2021-12-20 LAB — CULTURE, BLOOD (ROUTINE X 2)
Culture: NO GROWTH
Culture: NO GROWTH
Special Requests: ADEQUATE
Special Requests: ADEQUATE

## 2022-01-18 ENCOUNTER — Ambulatory Visit (INDEPENDENT_AMBULATORY_CARE_PROVIDER_SITE_OTHER): Payer: Medicare Other | Admitting: Gastroenterology

## 2022-03-08 ENCOUNTER — Telehealth (INDEPENDENT_AMBULATORY_CARE_PROVIDER_SITE_OTHER): Payer: Self-pay

## 2022-03-08 ENCOUNTER — Other Ambulatory Visit (INDEPENDENT_AMBULATORY_CARE_PROVIDER_SITE_OTHER): Payer: Self-pay

## 2022-03-08 ENCOUNTER — Encounter (INDEPENDENT_AMBULATORY_CARE_PROVIDER_SITE_OTHER): Payer: Self-pay | Admitting: Gastroenterology

## 2022-03-08 ENCOUNTER — Encounter (INDEPENDENT_AMBULATORY_CARE_PROVIDER_SITE_OTHER): Payer: Self-pay

## 2022-03-08 ENCOUNTER — Ambulatory Visit (INDEPENDENT_AMBULATORY_CARE_PROVIDER_SITE_OTHER): Payer: Medicare HMO | Admitting: Gastroenterology

## 2022-03-08 VITALS — BP 128/79 | HR 66 | Temp 97.5°F | Ht 61.0 in | Wt 269.0 lb

## 2022-03-08 DIAGNOSIS — R748 Abnormal levels of other serum enzymes: Secondary | ICD-10-CM

## 2022-03-08 DIAGNOSIS — R197 Diarrhea, unspecified: Secondary | ICD-10-CM

## 2022-03-08 DIAGNOSIS — D509 Iron deficiency anemia, unspecified: Secondary | ICD-10-CM | POA: Diagnosis not present

## 2022-03-08 MED ORDER — PEG 3350-KCL-NA BICARB-NACL 420 G PO SOLR: 4000.0000 mL | ORAL | 0 refills | Status: DC

## 2022-03-08 NOTE — Telephone Encounter (Signed)
Kalynne Womac Ann Maggie Senseney, CMA  ?

## 2022-03-08 NOTE — Progress Notes (Signed)
? ?Referring Provider: Bridget Hartshorn, NP ?Primary Care Physician:  Bridget Hartshorn, NP ?Primary GI Physician: castaneda ? ?Chief Complaint  ?Patient presents with  ? Hospitalization Follow-up  ?  Hospitalization follow up. Concerned about gas, very painful, blood in stool, trouble with hemorroids.   ? ? ?HPI:   ?[name] is a 61 y.o. female with past medical history of OSA, DM type 2, HTN, depression ? ?Patient presenting today for hospital follow up after hospitalization 11/22/21 for generalized weakness, nausea and dizziness x2 weeks. ? ?On admission, Found to have elevated LFTs, GI consulted for further evaluation. Recently treated with doxycycline for cellulitis of LEs secondary to uncontrolled DM. (Previous LFTs in 2021 were normal).Marland Kitchen  AST rose up to 127, ALT was 55, total bilirubin was 1.1, albumin was 2.6, alkaline phosphatase was 118.  She was also noticed to have severe leukocytosis of 18,000 and and increased lactic acid of 2.0.  Today her LFTs were elevated still with AST was 224, ALT was 115, total bilirubin 0.4, alkaline phosphatase was 144. She had been on abx for management of cellulitis. Liver US obtained was non diagnostic due to body habitus. Suspected drug induced liver injury secondary to doxycycline, given pattern of LFT, however, viral Hepatitis and AIH serologies done as well. Negative viral hepatitis markers, negative serologies for AIH, EBV and CMV. Lipase normal. Notably also had mild anemia during admission with dark stools, ?black, patient very poor historian. No BRBPR. Reported epigastric pain with benign exam. Ferritin 75, iron 15, TIBC 183, saturation 8%, no previous EGD or colonoscopy. Advised to continue PPI therapy. ? ?Last hgb 10.4 with MCV 79.1- 12/19/21 ?LFTs with AST 21, ALT 13, ALk Phos 104 T bili 0.4, PT-INR WNL on 12/16/21 ? ?Notably, had subsequent hospitalization 12/15/21 for hypoxia/resp distress in January and was on maxipime during admission,  transitioned to oral augmentin and doxycycline x2 weeks at d/c ? ?Today, patient states that she has some LLQ pain that feels like gas and radiates over to her lower abdomen. Was started on omeprazole 3m BID and feels that heartburn is well controlled. Denies any issues with dysphagia or odynophagia. She states appetite is stable. She reports that she has diarrhea about 3 times per day. Denies any recent solid stools, she states that diarrhea started after her stroke a few months back. She does report some BRBPR in the toilet with every BM, this has been ongoing for the past month. Denies sob, dizziness or fatigue. She endorses itching, burning sometimes in her rectal area, She reports that she notices these symptoms prior to having a BM.She also reports some vaginal irritation as well. Is unsure about melena, she states that stools are "stringy."  She notes mucus in her stools as well, this has been going on for about 2-3 weeks. Denies nausea or vomiting. TSH was 5.196 in January. She reports that blood sugars are around 200. ? ?Reports grandmother had CRC in her 642s ? ?Last Colonoscopy:never ?Last Endoscopy:never ? ? ?Past Medical History:  ?Diagnosis Date  ? Asthma   ? COPD (chronic obstructive pulmonary disease) ([facility]   ? Depression   ? Diabetes mellitus   ? Diastolic dysfunction 60/08/8118 ? Hypertension   ? Prolonged QT interval 05/14/2015  ? ? ?Past Surgical History:  ?Procedure Laterality Date  ? CATARACT EXTRACTION    ? CESAREAN SECTION    ? CHOLECYSTECTOMY    ? ? ?Current Outpatient Medications  ?Medication Sig Dispense Refill  ? albuterol (VENTOLIN HFA)  108 (90 Base) MCG/ACT inhaler Inhale 2 puffs into the lungs every 6 (six) hours as needed for wheezing or shortness of breath.    ? amLODipine (NORVASC) 5 MG tablet Take 1 tablet (5 mg total) by mouth daily. (Patient taking differently: Take 10 mg by mouth daily.)    ? atorvastatin (LIPITOR) 10 MG tablet Take 10 mg by mouth at bedtime.    ? gabapentin  (NEURONTIN) 100 MG capsule Take 1 capsule (100 mg total) by mouth 3 (three) times daily.    ? Glucerna (GLUCERNA) LIQD Take 237 mLs by mouth daily. For wound healing    ? hydrALAZINE (APRESOLINE) 25 MG tablet Take 1 tablet (25 mg total) by mouth every 8 (eight) hours.    ? hydrOXYzine (ATARAX/VISTARIL) 10 MG tablet Take 10 mg by mouth every 8 (eight) hours as needed for itching or anxiety.     ? insulin glargine-yfgn (SEMGLEE) 100 UNIT/ML injection Inject 0.4 mLs (40 Units total) into the skin daily. 10 mL 11  ? INSULIN LISPRO, 1 UNIT DIAL, [place] Inject 12 Units into the skin with breakfast, with lunch, and with evening meal. Admelogg solution 12 units before meals  - sliding scale    ? levothyroxine (SYNTHROID) 88 MCG tablet Take 88 mcg by mouth daily before breakfast.    ? loperamide (IMODIUM A-D) 2 MG tablet Take 2 mg by mouth. One prn. Do not exceed 4 per day    ? magnesium oxide (MAG-OX) 400 (240 Mg) MG tablet Take 1 tablet by mouth daily.    ? Multiple Vitamin (MULTIVITAMIN) tablet Take 1 tablet by mouth daily.    ? omeprazole (PRILOSEC) 20 MG capsule Take 20 mg by mouth 2 (two) times daily before a meal.    ? OVER THE COUNTER MEDICATION Vitamin C 500 mg bid for wound healing ? ?Vit D3 50 mcg - 2,000 iu once daily ? ?Zince - 220 one in the morning    ? potassium chloride SA (K-DUR) 20 MEQ tablet Take 20 mEq by mouth daily.    ? propranolol ER (INDERAL LA) 80 MG 24 hr capsule Take 80 mg by mouth daily.    ? sertraline (ZOLOFT) 50 MG tablet Take 3 tablets (150 mg total) by mouth daily. (Patient taking differently: Take 50 mg by mouth daily.)    ? Nystatin (GERHARDT'S BUTT CREAM) CREA As directed (Patient not taking: Reported on 12/16/2021)    ? nystatin (MYCOSTATIN/NYSTOP) powder Apply topically 2 (two) times daily. (Patient not taking: Reported on 12/16/2021) 15 g 0  ? ?No current facility-administered medications for this visit.  ? ? ?Allergies as of 03/08/2022 - Review Complete 03/08/2022  ?Allergen Reaction Noted   ? Carvedilol Palpitations 01/30/2020  ? Benicar [olmesartan] Swelling 07/22/2014  ? Codeine Other (See Comments) 08/20/2011  ? Sulfa antibiotics Swelling 08/20/2011  ? Trulicity [dulaglutide] Diarrhea 09/07/2017  ? ? ?Family History  ?Problem Relation Age of Onset  ? Stroke Mother   ? Diabetes Father   ? Heart failure Father   ? Hypertension Father   ? Diabetes Sister   ? Heart failure Sister   ? Hypertension Sister   ? Stroke Sister   ? Cancer Other   ? Stroke Sister   ? ? ?Social History  ? ?Socioeconomic History  ? Marital status: Married  ?  Spouse name: Not on file  ? Number of children: Not on file  ? Years of education: Not on file  ? Highest education level: Not on file  ?Occupational  History  ? Not on file  ?Tobacco Use  ? Smoking status: Never  ?  Passive exposure: Yes  ? Smokeless tobacco: Never  ?Vaping Use  ? Vaping Use: Never used  ?Substance and Sexual Activity  ? Alcohol use: No  ?  Alcohol/week: 0.0 standard drinks  ? Drug use: No  ? Sexual activity: Yes  ?  Birth control/protection: None  ?Other Topics Concern  ? Not on file  ?Social History Narrative  ? Lives with husband.  One living son.  Daughter died after transplant age 12.    ? ?Social Determinants of Health  ? ?Financial Resource Strain: Not on file  ?Food Insecurity: Not on file  ?Transportation Needs: Not on file  ?Physical Activity: Not on file  ?Stress: Not on file  ?Social Connections: Not on file  ? ?Review of systems ?General: negative for malaise, night sweats, fever, chills, weight loss ?Neck: Negative for lumps, goiter, pain and significant neck swelling ?Resp: Negative for cough, wheezing, dyspnea at rest ?CV: Negative for chest pain, leg swelling, palpitations, orthopnea ?GI: denies  nausea, vomiting, constipation, dysphagia, odyonophagia, early satiety or unintentional weight loss. +diarrhea, stringy stools +hematochezia ?+melena ?MSK: Negative for joint pain or swelling, back pain, and muscle pain. ?Derm: Negative for itching  or rash ?Psych: Denies depression, anxiety, memory loss, confusion. No homicidal or suicidal ideation.  ?Heme: Negative for prolonged bleeding, bruising easily, and swollen nodes. ?Endocrine: Negative for cold

## 2022-03-08 NOTE — H&P (View-Only) (Signed)
? ?Referring Provider: [name], Katherine V, NP ?Primary Care Physician:  [name], Katherine V, NP ?Primary GI Physician: castaneda ? ?Chief Complaint  ?Patient presents with  ? Hospitalization Follow-up  ?  Hospitalization follow up. Concerned about gas, very painful, blood in stool, trouble with hemorroids.   ? ? ?HPI:   ?[name] is a 60 y.o. female with past medical history of OSA, DM type 2, HTN, depression ? ?Patient presenting today for hospital follow up after hospitalization 11/22/21 for generalized weakness, nausea and dizziness x2 weeks. ? ?On admission, Found to have elevated LFTs, GI consulted for further evaluation. Recently treated with doxycycline for cellulitis of LEs secondary to uncontrolled DM. (Previous LFTs in 2021 were normal)..  AST rose up to 127, ALT was 55, total bilirubin was 1.1, albumin was 2.6, alkaline phosphatase was 118.  She was also noticed to have severe leukocytosis of 18,000 and and increased lactic acid of 2.0.  Today her LFTs were elevated still with AST was 224, ALT was 115, total bilirubin 0.4, alkaline phosphatase was 144. She had been on abx for management of cellulitis. Liver US obtained was non diagnostic due to body habitus. Suspected drug induced liver injury secondary to doxycycline, given pattern of LFT, however, viral Hepatitis and AIH serologies done as well. Negative viral hepatitis markers, negative serologies for AIH, EBV and CMV. Lipase normal. Notably also had mild anemia during admission with dark stools, ?black, patient very poor historian. No BRBPR. Reported epigastric pain with benign exam. Ferritin 75, iron 15, TIBC 183, saturation 8%, no previous EGD or colonoscopy. Advised to continue PPI therapy. ? ?Last hgb 10.4 with MCV 79.1- 12/19/21 ?LFTs with AST 21, ALT 13, ALk Phos 104 T bili 0.4, PT-INR WNL on 12/16/21 ? ?Notably, had subsequent hospitalization 12/15/21 for hypoxia/resp distress in January and was on maxipime during admission,  transitioned to oral augmentin and doxycycline x2 weeks at d/c ? ?Today, patient states that she has some LLQ pain that feels like gas and radiates over to her lower abdomen. Was started on omeprazole 20mg BID and feels that heartburn is well controlled. Denies any issues with dysphagia or odynophagia. She states appetite is stable. She reports that she has diarrhea about 3 times per day. Denies any recent solid stools, she states that diarrhea started after her stroke a few months back. She does report some BRBPR in the toilet with every BM, this has been ongoing for the past month. Denies sob, dizziness or fatigue. She endorses itching, burning sometimes in her rectal area, She reports that she notices these symptoms prior to having a BM.She also reports some vaginal irritation as well. Is unsure about melena, she states that stools are "stringy."  She notes mucus in her stools as well, this has been going on for about 2-3 weeks. Denies nausea or vomiting. TSH was 5.196 in January. She reports that blood sugars are around 200. ? ?Reports grandmother had CRC in her 60s. ? ?Last Colonoscopy:never ?Last Endoscopy:never ? ? ?Past Medical History:  ?Diagnosis Date  ? Asthma   ? COPD (chronic obstructive pulmonary disease) ([facility])   ? Depression   ? Diabetes mellitus   ? Diastolic dysfunction 05/15/2015  ? Hypertension   ? Prolonged QT interval 05/14/2015  ? ? ?Past Surgical History:  ?Procedure Laterality Date  ? CATARACT EXTRACTION    ? CESAREAN SECTION    ? CHOLECYSTECTOMY    ? ? ?Current Outpatient Medications  ?Medication Sig Dispense Refill  ? albuterol (VENTOLIN HFA)   108 (90 Base) MCG/ACT inhaler Inhale 2 puffs into the lungs every 6 (six) hours as needed for wheezing or shortness of breath.    ? amLODipine (NORVASC) 5 MG tablet Take 1 tablet (5 mg total) by mouth daily. (Patient taking differently: Take 10 mg by mouth daily.)    ? atorvastatin (LIPITOR) 10 MG tablet Take 10 mg by mouth at bedtime.    ? gabapentin  (NEURONTIN) 100 MG capsule Take 1 capsule (100 mg total) by mouth 3 (three) times daily.    ? Glucerna (GLUCERNA) LIQD Take 237 mLs by mouth daily. For wound healing    ? hydrALAZINE (APRESOLINE) 25 MG tablet Take 1 tablet (25 mg total) by mouth every 8 (eight) hours.    ? hydrOXYzine (ATARAX/VISTARIL) 10 MG tablet Take 10 mg by mouth every 8 (eight) hours as needed for itching or anxiety.     ? insulin glargine-yfgn (SEMGLEE) 100 UNIT/ML injection Inject 0.4 mLs (40 Units total) into the skin daily. 10 mL 11  ? INSULIN LISPRO, 1 UNIT DIAL, [place] Inject 12 Units into the skin with breakfast, with lunch, and with evening meal. Admelogg solution 12 units before meals  - sliding scale    ? levothyroxine (SYNTHROID) 88 MCG tablet Take 88 mcg by mouth daily before breakfast.    ? loperamide (IMODIUM A-D) 2 MG tablet Take 2 mg by mouth. One prn. Do not exceed 4 per day    ? magnesium oxide (MAG-OX) 400 (240 Mg) MG tablet Take 1 tablet by mouth daily.    ? Multiple Vitamin (MULTIVITAMIN) tablet Take 1 tablet by mouth daily.    ? omeprazole (PRILOSEC) 20 MG capsule Take 20 mg by mouth 2 (two) times daily before a meal.    ? OVER THE COUNTER MEDICATION Vitamin C 500 mg bid for wound healing ? ?Vit D3 50 mcg - 2,000 iu once daily ? ?Zince - 220 one in the morning    ? potassium chloride SA (K-DUR) 20 MEQ tablet Take 20 mEq by mouth daily.    ? propranolol ER (INDERAL LA) 80 MG 24 hr capsule Take 80 mg by mouth daily.    ? sertraline (ZOLOFT) 50 MG tablet Take 3 tablets (150 mg total) by mouth daily. (Patient taking differently: Take 50 mg by mouth daily.)    ? Nystatin (GERHARDT'S BUTT CREAM) CREA As directed (Patient not taking: Reported on 12/16/2021)    ? nystatin (MYCOSTATIN/NYSTOP) powder Apply topically 2 (two) times daily. (Patient not taking: Reported on 12/16/2021) 15 g 0  ? ?No current facility-administered medications for this visit.  ? ? ?Allergies as of 03/08/2022 - Review Complete 03/08/2022  ?Allergen Reaction Noted   ? Carvedilol Palpitations 01/30/2020  ? Benicar [olmesartan] Swelling 07/22/2014  ? Codeine Other (See Comments) 08/20/2011  ? Sulfa antibiotics Swelling 08/20/2011  ? Trulicity [dulaglutide] Diarrhea 09/07/2017  ? ? ?Family History  ?Problem Relation Age of Onset  ? Stroke Mother   ? Diabetes Father   ? Heart failure Father   ? Hypertension Father   ? Diabetes Sister   ? Heart failure Sister   ? Hypertension Sister   ? Stroke Sister   ? Cancer Other   ? Stroke Sister   ? ? ?Social History  ? ?Socioeconomic History  ? Marital status: Married  ?  Spouse name: Not on file  ? Number of children: Not on file  ? Years of education: Not on file  ? Highest education level: Not on file  ?Occupational   History  ? Not on file  ?Tobacco Use  ? Smoking status: Never  ?  Passive exposure: Yes  ? Smokeless tobacco: Never  ?Vaping Use  ? Vaping Use: Never used  ?Substance and Sexual Activity  ? Alcohol use: No  ?  Alcohol/week: 0.0 standard drinks  ? Drug use: No  ? Sexual activity: Yes  ?  Birth control/protection: None  ?Other Topics Concern  ? Not on file  ?Social History Narrative  ? Lives with husband.  One living son.  Daughter died after transplant age 16.    ? ?Social Determinants of Health  ? ?Financial Resource Strain: Not on file  ?Food Insecurity: Not on file  ?Transportation Needs: Not on file  ?Physical Activity: Not on file  ?Stress: Not on file  ?Social Connections: Not on file  ? ?Review of systems ?General: negative for malaise, night sweats, fever, chills, weight loss ?Neck: Negative for lumps, goiter, pain and significant neck swelling ?Resp: Negative for cough, wheezing, dyspnea at rest ?CV: Negative for chest pain, leg swelling, palpitations, orthopnea ?GI: denies  nausea, vomiting, constipation, dysphagia, odyonophagia, early satiety or unintentional weight loss. +diarrhea, stringy stools +hematochezia ?+melena ?MSK: Negative for joint pain or swelling, back pain, and muscle pain. ?Derm: Negative for itching  or rash ?Psych: Denies depression, anxiety, memory loss, confusion. No homicidal or suicidal ideation.  ?Heme: Negative for prolonged bleeding, bruising easily, and swollen nodes. ?Endocrine: Negative for cold

## 2022-03-08 NOTE — Patient Instructions (Signed)
We will get you scheduled for EGD and colonoscopy for ongoing iron deficiency anemia ?Will check stool studies to rule out infectious causes of your diarrhea ?Please let me know if you develop new or worsening GI symptoms ?Reassuringly, liver function appears back to normal during last labs in January.  ?

## 2022-03-09 ENCOUNTER — Encounter (INDEPENDENT_AMBULATORY_CARE_PROVIDER_SITE_OTHER): Payer: Self-pay

## 2022-03-09 DIAGNOSIS — R197 Diarrhea, unspecified: Secondary | ICD-10-CM | POA: Insufficient documentation

## 2022-03-17 ENCOUNTER — Telehealth (INDEPENDENT_AMBULATORY_CARE_PROVIDER_SITE_OTHER): Payer: Self-pay

## 2022-03-17 NOTE — Telephone Encounter (Signed)
Opened in Error.

## 2022-03-31 NOTE — Patient Instructions (Signed)
? ? ? ? ? ? ? ? Lonie Peak Belvin ? 03/31/2022  ?  ? '@PREFPERIOPPHARMACY'$ @ ? ? Your procedure is scheduled on  04/06/2022. ? ? Report to [facility] at  [place] A.[facility]. ? ? Call this number if you have problems the morning of surgery: ? [phone] ? ? Remember: ? Follow the diet and prep instructions given to you by the office. ? ?  If you take your insulin at night, take 24 units(1/2) of your usual insulin the night before your procedure. ? ?  DO NOT take any medications for diabetes the morning of your procedure. ? ?  Use your inhaler before you come and bring your rescue inhaler with you. ? ? ?  ? Take these medicines the morning of surgery with A SIP OF WATER  ? ?         amlodipine, gabapentin, hydroxyzine(if needed), levothyroxine, prilosec, propranolol, zoloft. ?  ? ? Do not wear jewelry, make-up or nail polish. ? Do not wear lotions, powders, or perfumes, or deodorant. ? Do not shave 48 hours prior to surgery.  Men may shave face and neck. ? Do not bring valuables to the hospital. ? [facility] is not responsible for any belongings or valuables. ? ?Contacts, dentures or bridgework may not be worn into surgery.  Leave your suitcase in the car.  After surgery it may be brought to your room. ? ?For patients admitted to the hospital, discharge time will be determined by your treatment team. ? ?Patients discharged the day of surgery will not be allowed to drive home and must have someone with them for 24 hours.  ? ? ?Special instructions:   DO NOT smoke tobacco or vape for 24 hours before your procedure. ? ?Please read over the following fact sheets that you were given. ?Anesthesia Post-op Instructions and Care and Recovery After Surgery ?  ? ? ? Upper Endoscopy, Adult, Care After ?This sheet gives you information about how to care for yourself after your procedure. Your health care provider may also give you more specific instructions. If you have problems or questions, contact your health care provider. ?What can I  expect after the procedure? ?After the procedure, it is common to have: ?A sore throat. ?Mild stomach pain or discomfort. ?Bloating. ?Nausea. ?Follow these instructions at home: ? ?Follow instructions from your health care provider about what to eat or drink after your procedure. ?Return to your normal activities as told by your health care provider. Ask your health care provider what activities are safe for you. ?Take over-the-counter and prescription medicines only as told by your health care provider. ?If you were given a sedative during the procedure, it can affect you for several hours. Do not drive or operate machinery until your health care provider says that it is safe. ?Keep all follow-up visits as told by your health care provider. This is important. ?Contact a health care provider if you have: ?A sore throat that lasts longer than one day. ?Trouble swallowing. ?Get help right away if: ?You vomit blood or your vomit looks like coffee grounds. ?You have: ?A fever. ?Bloody, black, or tarry stools. ?A severe sore throat or you cannot swallow. ?Difficulty breathing. ?Severe pain in your chest or abdomen. ?Summary ?After the procedure, it is common to have a sore throat, mild stomach discomfort, bloating, and nausea. ?If you were given a sedative during the procedure, it can affect you for several hours. Do not drive or operate machinery until your health  care provider says that it is safe. ?Follow instructions from your health care provider about what to eat or drink after your procedure. ?Return to your normal activities as told by your health care provider. ?This information is not intended to replace advice given to you by your health care provider. Make sure you discuss any questions you have with your health care provider. ?Document Revised: 09/28/2019 Document Reviewed: 04/24/2018 ?Elsevier Patient Education ? [place]. ?Colonoscopy, Adult, Care After ?The following information offers guidance  on how to care for yourself after your procedure. Your health care provider may also give you more specific instructions. If you have problems or questions, contact your health care provider. ?What can I expect after the procedure? ?After the procedure, it is common to have: ?A small amount of blood in your stool for 24 hours after the procedure. ?Some gas. ?Mild cramping or bloating of your abdomen. ?Follow these instructions at home: ?Eating and drinking ? ?Drink enough fluid to keep your urine pale yellow. ?Follow instructions from your health care provider about eating or drinking restrictions. ?Resume your normal diet as told by your health care provider. Avoid heavy or fried foods that are hard to digest. ?Activity ?Rest as told by your health care provider. ?Avoid sitting for a long time without moving. Get up to take short walks every 1-2 hours. This is important to improve blood flow and breathing. Ask for help if you feel weak or unsteady. ?Return to your normal activities as told by your health care provider. Ask your health care provider what activities are safe for you. ?Managing cramping and bloating ? ?Try walking around when you have cramps or feel bloated. ?If directed, apply heat to your abdomen as told by your health care provider. Use the heat source that your health care provider recommends, such as a moist heat pack or a heating pad. ?Place a towel between your skin and the heat source. ?Leave the heat on for 20-30 minutes. ?Remove the heat if your skin turns bright red. This is especially important if you are unable to feel pain, heat, or cold. You have a greater risk of getting burned. ?General instructions ?If you were given a sedative during the procedure, it can affect you for several hours. Do not drive or operate machinery until your health care provider says that it is safe. ?For the first 24 hours after the procedure: ?Do not sign important documents. ?Do not drink alcohol. ?Do your  regular daily activities at a slower pace than normal. ?Eat soft foods that are easy to digest. ?Take over-the-counter and prescription medicines only as told by your health care provider. ?Keep all follow-up visits. This is important. ?Contact a health care provider if: ?You have blood in your stool 2-3 days after the procedure. ?Get help right away if: ?You have more than a small spotting of blood in your stool. ?You have large blood clots in your stool. ?You have swelling of your abdomen. ?You have nausea or vomiting. ?You have a fever. ?You have increasing pain in your abdomen that is not relieved with medicine. ?These symptoms may be an emergency. Get help right away. Call 911. ?Do not wait to see if the symptoms will go away. ?Do not drive yourself to the hospital. ?Summary ?After the procedure, it is common to have a small amount of blood in your stool. You may also have mild cramping and bloating of your abdomen. ?If you were given a sedative during the procedure, it  can affect you for several hours. Do not drive or operate machinery until your health care provider says that it is safe. ?Get help right away if you have a lot of blood in your stool, nausea or vomiting, a fever, or increased pain in your abdomen. ?This information is not intended to replace advice given to you by your health care provider. Make sure you discuss any questions you have with your health care provider. ?Document Revised: 07/15/2021 Document Reviewed: 07/15/2021 ?Elsevier Patient Education ? [place]. ?Monitored Anesthesia Care, Care After ?This sheet gives you information about how to care for yourself after your procedure. Your health care provider may also give you more specific instructions. If you have problems or questions, contact your health care provider. ?What can I expect after the procedure? ?After the procedure, it is common to have: ?Tiredness. ?Forgetfulness about what happened after the procedure. ?Impaired  judgment for important decisions. ?Nausea or vomiting. ?Some difficulty with balance. ?Follow these instructions at home: ?For the time period you were told by your health care provider: ? ?  ? ?Rest as

## 2022-04-01 ENCOUNTER — Encounter (HOSPITAL_COMMUNITY): Payer: Self-pay

## 2022-04-01 ENCOUNTER — Encounter (HOSPITAL_COMMUNITY)
Admission: RE | Admit: 2022-04-01 | Discharge: 2022-04-01 | Disposition: A | Discharge status: Home / Self Care | Payer: Medicare HMO | Source: Ambulatory Visit | Attending: Gastroenterology | Admitting: Gastroenterology

## 2022-04-01 VITALS — BP 153/68 | HR 59 | Temp 97.6°F | Resp 18 | Ht 61.0 in | Wt 262.0 lb

## 2022-04-01 DIAGNOSIS — D509 Iron deficiency anemia, unspecified: Secondary | ICD-10-CM | POA: Insufficient documentation

## 2022-04-01 DIAGNOSIS — Z01812 Encounter for preprocedural laboratory examination: Secondary | ICD-10-CM | POA: Insufficient documentation

## 2022-04-01 DIAGNOSIS — Z794 Long term (current) use of insulin: Secondary | ICD-10-CM | POA: Insufficient documentation

## 2022-04-01 DIAGNOSIS — E1165 Type 2 diabetes mellitus with hyperglycemia: Secondary | ICD-10-CM | POA: Diagnosis not present

## 2022-04-01 HISTORY — DX: Hyperlipidemia, unspecified: E78.5

## 2022-04-01 HISTORY — DX: Obesity, unspecified: E66.9

## 2022-04-01 HISTORY — DX: Palpitations: R00.2

## 2022-04-01 HISTORY — DX: Weakness: R53.1

## 2022-04-01 HISTORY — DX: Hypothyroidism, unspecified: E03.9

## 2022-04-01 HISTORY — DX: Nausea with vomiting, unspecified: R11.2

## 2022-04-01 HISTORY — DX: Other specified postprocedural states: Z98.890

## 2022-04-01 HISTORY — DX: Sleep apnea, unspecified: G47.30

## 2022-04-01 LAB — BASIC METABOLIC PANEL
Anion gap: 7 (ref 5–15)
BUN: 25 mg/dL — ABNORMAL HIGH (ref 6–20)
CO2: 30 mmol/L (ref 22–32)
Calcium: 9.3 mg/dL (ref 8.9–10.3)
Chloride: 103 mmol/L (ref 98–111)
Creatinine, Ser: 1.28 mg/dL — ABNORMAL HIGH (ref 0.44–1.00)
GFR, Estimated: 48 mL/min — ABNORMAL LOW (ref >60)
Glucose, Bld: 241 mg/dL — ABNORMAL HIGH (ref 70–99)
Potassium: 4.1 mmol/L (ref 3.5–5.1)
Sodium: 140 mmol/L (ref 135–145)

## 2022-04-01 LAB — CBC WITH DIFFERENTIAL/PLATELET
Abs Immature Granulocytes: 0.05 10*3/uL (ref 0.00–0.07)
Basophils Absolute: 0.1 10*3/uL (ref 0.0–0.1)
Basophils Relative: 1 %
Eosinophils Absolute: 0.5 10*3/uL (ref 0.0–0.5)
Eosinophils Relative: 4 %
HCT: 33.8 % — ABNORMAL LOW (ref 36.0–46.0)
Hemoglobin: 10.8 g/dL — ABNORMAL LOW (ref 12.0–15.0)
Immature Granulocytes: 0 %
Lymphocytes Relative: 13 %
Lymphs Abs: 1.6 10*3/uL (ref 0.7–4.0)
MCH: 25.2 pg — ABNORMAL LOW (ref 26.0–34.0)
MCHC: 32 g/dL (ref 30.0–36.0)
MCV: 78.8 fL — ABNORMAL LOW (ref 80.0–100.0)
Monocytes Absolute: 0.5 10*3/uL (ref 0.1–1.0)
Monocytes Relative: 4 %
Neutro Abs: 9.2 10*3/uL — ABNORMAL HIGH (ref 1.7–7.7)
Neutrophils Relative %: 78 %
Platelets: 409 10*3/uL — ABNORMAL HIGH (ref 150–400)
RBC: 4.29 MIL/uL (ref 3.87–5.11)
RDW: 15.3 % (ref 11.5–15.5)
WBC: 12 10*3/uL — ABNORMAL HIGH (ref 4.0–10.5)
nRBC: 0 % (ref 0.0–0.2)

## 2022-04-06 ENCOUNTER — Encounter (HOSPITAL_COMMUNITY): Payer: Self-pay | Admitting: Gastroenterology

## 2022-04-06 ENCOUNTER — Other Ambulatory Visit: Payer: Self-pay

## 2022-04-06 ENCOUNTER — Encounter (HOSPITAL_COMMUNITY)
Admission: RE | Disposition: A | Discharge status: Skilled Nursing Facility | Payer: Self-pay | Source: Home / Self Care | Attending: Gastroenterology

## 2022-04-06 ENCOUNTER — Encounter (INDEPENDENT_AMBULATORY_CARE_PROVIDER_SITE_OTHER): Payer: Self-pay | Admitting: *Deleted

## 2022-04-06 ENCOUNTER — Ambulatory Visit (HOSPITAL_COMMUNITY)
Admission: RE | Admit: 2022-04-06 | Discharge: 2022-04-06 | Disposition: A | Discharge status: Skilled Nursing Facility | Payer: Medicare HMO | Attending: Gastroenterology | Admitting: Gastroenterology

## 2022-04-06 ENCOUNTER — Ambulatory Visit (HOSPITAL_BASED_OUTPATIENT_CLINIC_OR_DEPARTMENT_OTHER): Payer: Medicare HMO | Admitting: Anesthesiology

## 2022-04-06 ENCOUNTER — Ambulatory Visit (HOSPITAL_COMMUNITY): Payer: Medicare HMO | Admitting: Anesthesiology

## 2022-04-06 DIAGNOSIS — J449 Chronic obstructive pulmonary disease, unspecified: Secondary | ICD-10-CM | POA: Diagnosis not present

## 2022-04-06 DIAGNOSIS — Z79890 Hormone replacement therapy (postmenopausal): Secondary | ICD-10-CM | POA: Diagnosis not present

## 2022-04-06 DIAGNOSIS — K644 Residual hemorrhoidal skin tags: Secondary | ICD-10-CM | POA: Insufficient documentation

## 2022-04-06 DIAGNOSIS — K219 Gastro-esophageal reflux disease without esophagitis: Secondary | ICD-10-CM | POA: Insufficient documentation

## 2022-04-06 DIAGNOSIS — E039 Hypothyroidism, unspecified: Secondary | ICD-10-CM | POA: Diagnosis not present

## 2022-04-06 DIAGNOSIS — D509 Iron deficiency anemia, unspecified: Secondary | ICD-10-CM | POA: Insufficient documentation

## 2022-04-06 DIAGNOSIS — K648 Other hemorrhoids: Secondary | ICD-10-CM | POA: Diagnosis not present

## 2022-04-06 DIAGNOSIS — K317 Polyp of stomach and duodenum: Secondary | ICD-10-CM

## 2022-04-06 DIAGNOSIS — C186 Malignant neoplasm of descending colon: Secondary | ICD-10-CM

## 2022-04-06 DIAGNOSIS — I10 Essential (primary) hypertension: Secondary | ICD-10-CM | POA: Insufficient documentation

## 2022-04-06 DIAGNOSIS — K2289 Other specified disease of esophagus: Secondary | ICD-10-CM | POA: Diagnosis not present

## 2022-04-06 DIAGNOSIS — Z6841 Body Mass Index (BMI) 40.0 and over, adult: Secondary | ICD-10-CM | POA: Diagnosis not present

## 2022-04-06 DIAGNOSIS — K5669 Other partial intestinal obstruction: Secondary | ICD-10-CM | POA: Diagnosis not present

## 2022-04-06 DIAGNOSIS — K625 Hemorrhage of anus and rectum: Secondary | ICD-10-CM | POA: Diagnosis not present

## 2022-04-06 DIAGNOSIS — K3189 Other diseases of stomach and duodenum: Secondary | ICD-10-CM | POA: Diagnosis not present

## 2022-04-06 DIAGNOSIS — E119 Type 2 diabetes mellitus without complications: Secondary | ICD-10-CM | POA: Diagnosis not present

## 2022-04-06 DIAGNOSIS — Z794 Long term (current) use of insulin: Secondary | ICD-10-CM | POA: Diagnosis not present

## 2022-04-06 DIAGNOSIS — G4730 Sleep apnea, unspecified: Secondary | ICD-10-CM | POA: Insufficient documentation

## 2022-04-06 DIAGNOSIS — K635 Polyp of colon: Secondary | ICD-10-CM | POA: Diagnosis not present

## 2022-04-06 DIAGNOSIS — D123 Benign neoplasm of transverse colon: Secondary | ICD-10-CM | POA: Insufficient documentation

## 2022-04-06 DIAGNOSIS — F32A Depression, unspecified: Secondary | ICD-10-CM | POA: Diagnosis not present

## 2022-04-06 DIAGNOSIS — D122 Benign neoplasm of ascending colon: Secondary | ICD-10-CM | POA: Insufficient documentation

## 2022-04-06 DIAGNOSIS — D490 Neoplasm of unspecified behavior of digestive system: Secondary | ICD-10-CM

## 2022-04-06 DIAGNOSIS — Z79899 Other long term (current) drug therapy: Secondary | ICD-10-CM | POA: Diagnosis not present

## 2022-04-06 DIAGNOSIS — C20 Malignant neoplasm of rectum: Secondary | ICD-10-CM

## 2022-04-06 HISTORY — PX: SUBMUCOSAL TATTOO INJECTION: SHX6856

## 2022-04-06 HISTORY — PX: BIOPSY: SHX5522

## 2022-04-06 HISTORY — PX: HEMOSTASIS CLIP PLACEMENT: SHX6857

## 2022-04-06 HISTORY — PX: COLONOSCOPY WITH PROPOFOL: SHX5780

## 2022-04-06 HISTORY — PX: ESOPHAGOGASTRODUODENOSCOPY (EGD) WITH PROPOFOL: SHX5813

## 2022-04-06 HISTORY — PX: POLYPECTOMY: SHX5525

## 2022-04-06 LAB — GLUCOSE, CAPILLARY: Glucose-Capillary: 90 mg/dL (ref 70–99)

## 2022-04-06 LAB — HM COLONOSCOPY

## 2022-04-06 SURGERY — COLONOSCOPY WITH PROPOFOL
Anesthesia: General

## 2022-04-06 MED ORDER — SPOT INK MARKER SYRINGE KIT
PACK | SUBMUCOSAL | Status: AC
  Filled 2022-04-06: qty 5

## 2022-04-06 MED ORDER — PROPOFOL 10 MG/ML IV BOLUS
INTRAVENOUS | Status: DC | PRN
  Administered 2022-04-06: 100 mg via INTRAVENOUS

## 2022-04-06 MED ORDER — STERILE WATER FOR IRRIGATION IR SOLN
Status: DC | PRN
  Administered 2022-04-06: 120 mL

## 2022-04-06 MED ORDER — EPHEDRINE 5 MG/ML INJ
INTRAVENOUS | Status: AC
  Filled 2022-04-06: qty 5

## 2022-04-06 MED ORDER — LIDOCAINE HCL (CARDIAC) PF 100 MG/5ML IV SOSY
PREFILLED_SYRINGE | INTRAVENOUS | Status: DC | PRN
  Administered 2022-04-06: 50 mg via INTRAVENOUS

## 2022-04-06 MED ORDER — SPOT INK MARKER SYRINGE KIT
PACK | SUBMUCOSAL | Status: DC | PRN
  Administered 2022-04-06: 1.5 mL via SUBMUCOSAL

## 2022-04-06 MED ORDER — EPHEDRINE SULFATE-NACL 50-0.9 MG/10ML-% IV SOSY
PREFILLED_SYRINGE | INTRAVENOUS | Status: DC | PRN
  Administered 2022-04-06 (×5): 5 mg via INTRAVENOUS

## 2022-04-06 MED ORDER — LACTATED RINGERS IV SOLN: INTRAVENOUS | Status: DC

## 2022-04-06 MED ORDER — PROPOFOL 500 MG/50ML IV EMUL
INTRAVENOUS | Status: DC | PRN
Start: 2022-04-06 — End: 2022-04-06
  Administered 2022-04-06: 150 ug/kg/min via INTRAVENOUS

## 2022-04-06 NOTE — Discharge Instructions (Addendum)
You are being discharged to home.  ?Resume your previous diet.  ?We are waiting for your pathology results.  ?Continue your present medications.  ?You are being discharged to home.  ?Your physician has recommended a repeat colonoscopy for surveillance based on pathology results.  ?Perform CT chest/abdomen/pelvis. ?Referral to oncology/general surgery once pathology is back. ?

## 2022-04-06 NOTE — Anesthesia Postprocedure Evaluation (Signed)
Anesthesia Post Note ? ?Patient: [name] ? ?Procedure(s) Performed: COLONOSCOPY WITH PROPOFOL ?ESOPHAGOGASTRODUODENOSCOPY (EGD) WITH PROPOFOL ?POLYPECTOMY ?BIOPSY ?HEMOSTASIS CLIP PLACEMENT ?SUBMUCOSAL TATTOO INJECTION ? ?Patient location during evaluation: Phase II ?Anesthesia Type: General ?Level of consciousness: awake ?Pain management: pain level controlled ?Vital Signs Assessment: post-procedure vital signs reviewed and stable ?Respiratory status: spontaneous breathing and respiratory function stable ?Cardiovascular status: blood pressure returned to baseline and stable ?Postop Assessment: no headache and no apparent nausea or vomiting ?Anesthetic complications: no ?Comments: Late entry ? ? ?No notable events documented. ? ? ?Last Vitals:  ?Vitals:  ? 04/06/22 0754 04/06/22 0959  ?BP: (!) 161/66 (!) 119/53  ?Pulse: (!) 58 (!) 51  ?Resp: 14 18  ?Temp: 36.4 ?C 36.5 ?C  ?SpO2: 96% 95%  ?  ?Last Pain:  ?Vitals:  ? 04/06/22 0959  ?TempSrc: Oral  ?PainSc: 0-No pain  ? ? ?  ?  ?  ?  ?  ?  ? ?Louann Sjogren ? ? ? ? ?

## 2022-04-06 NOTE — Op Note (Signed)
[facility] ?Patient Name: [name] ?Procedure Date: 04/06/2022 7:36 AM ?MRN: [id number] ?Date of Birth: September 03, 1961 ?Attending MD: Maylon Peppers ,  ?CSN: [id number] ?Age: 61 ?Admit Type: Outpatient ?Procedure:                Upper GI endoscopy ?Indications:              Iron deficiency anemia ?Providers:                Maylon Peppers, Rosina Lowenstein, RN, Aram Candela ?Referring MD:              ?Medicines:                Monitored Anesthesia Care ?Complications:            No immediate complications. ?Estimated Blood Loss:     Estimated blood loss: none. ?Procedure:                Pre-Anesthesia Assessment: ?                          - Prior to the procedure, a History and Physical  ?                          was performed, and patient medications, allergies  ?                          and sensitivities were reviewed. The patient's  ?                          tolerance of previous anesthesia was reviewed. ?                          - The risks and benefits of the procedure and the  ?                          sedation options and risks were discussed with the  ?                          patient. All questions were answered and informed  ?                          consent was obtained. ?                          - ASA Grade Assessment: III - A patient with severe  ?                          systemic disease. ?                          After obtaining informed consent, the endoscope was  ?                          passed under direct vision. Throughout the  ?                          procedure, the patient's blood pressure,  pulse, and  ?                          oxygen saturations were monitored continuously. The  ?                          [id number]-H190 ([id number]) scope was introduced through the  ?                          mouth, and advanced to the second part of duodenum.  ?                          The upper GI endoscopy was accomplished without  ?                          difficulty. The patient  tolerated the procedure  ?                          well. ?Scope In: 8:56:02 AM ?Scope Out: 9:15:33 AM ?Total Procedure Duration: 0 hours 19 minutes 31 seconds  ?Findings: ?     The Z-line was irregular and was found 39 cm from the incisors. Biopsies  ?     were taken with a cold forceps for histology. ?     Multiple 4 to 8 mm sessile polyps with no bleeding and stigmata of  ?     recent bleeding were found in the entire examined stomach - some of the  ?     polyps had an inflammatory appearance. Five of these polyps were removed  ?     with a cold snare. Resection and retrieval were complete. For  ?     hemostasis, one hemostatic clip was successfully placed at the site of  ?     olargest polypectomy. There was no bleeding at the end of the procedure. ?     Patchy moderately erythematous mucosa without bleeding was found in the  ?     entire examined stomach. Biopsies were taken with a cold forceps for  ?     Helicobacter pylori testing. ?     The examined duodenum was normal. ?Impression:               - Z-line irregular, 39 cm from the incisors.  ?                          Biopsied. ?                          - Multiple gastric polyps. Resected and retrieved.  ?                          Clip was placed. ?                          - Erythematous mucosa in the stomach. Biopsied. ?                          - Normal examined duodenum. ?Moderate Sedation: ?     Per Anesthesia Care ?Recommendation:           -  Discharge patient to home (ambulatory). ?                          - Resume previous diet. ?                          - Await pathology results. ?                          - Continue present medications. ?Procedure Code(s):        --- Professional --- ?                          [phone], Esophagogastroduodenoscopy, flexible,  ?                          transoral; with removal of tumor(s), polyp(s), or  ?                          other lesion(s) by snare technique ?                          43239, 59,  Esophagogastroduodenoscopy, flexible,  ?                          transoral; with biopsy, single or multiple ?Diagnosis Code(s):        --- Professional --- ?                          K22.8, Other specified diseases of esophagus ?                          K31.7, Polyp of stomach and duodenum ?                          K31.89, Other diseases of stomach and duodenum ?                          D50.9, Iron deficiency anemia, unspecified ?CPT copyright 2019 American Medical Association. All rights reserved. ?The codes documented in this report are preliminary and upon coder review may  ?be revised to meet current compliance requirements. ?Maylon Peppers, MD ?Maylon Peppers,  ?04/06/2022 9:20:53 AM ?This report has been signed electronically. ?Number of Addenda: 0 ?

## 2022-04-06 NOTE — Op Note (Signed)
[facility] ?Patient Name: [name] ?Procedure Date: 04/06/2022 7:35 AM ?MRN: [id number] ?Date of Birth: 01/06/1961 ?Attending MD: Maylon Peppers ,  ?CSN: [id number] ?Age: 61 ?Admit Type: Outpatient ?Procedure:                Colonoscopy ?Indications:              Rectal bleeding, Iron deficiency anemia ?Providers:                Maylon Peppers, Rosina Lowenstein, RN, Aram Candela ?Referring MD:              ?Medicines:                Monitored Anesthesia Care ?Complications:            No immediate complications. ?Estimated Blood Loss:     Estimated blood loss: none. ?Procedure:                Pre-Anesthesia Assessment: ?                          - Prior to the procedure, a History and Physical  ?                          was performed, and patient medications, allergies  ?                          and sensitivities were reviewed. The patient's  ?                          tolerance of previous anesthesia was reviewed. ?                          - The risks and benefits of the procedure and the  ?                          sedation options and risks were discussed with the  ?                          patient. All questions were answered and informed  ?                          consent was obtained. ?                          - ASA Grade Assessment: III - A patient with severe  ?                          systemic disease. ?                          After obtaining informed consent, the colonoscope  ?                          was passed under direct vision. Throughout the  ?                          procedure, the patient's blood pressure,  pulse, and  ?                          oxygen saturations were monitored continuously. The  ?                          PCF-HQ190L ([id number]) scope was introduced through  ?                          the anus and advanced to the the cecum, identified  ?                          by appendiceal orifice and ileocecal valve. The  ?                          colonoscopy was  performed without difficulty. The  ?                          patient tolerated the procedure well. The quality  ?                          of the bowel preparation was good. ?Scope In: 9:22:29 AM ?Scope Out: 9:52:57 AM ?Scope Withdrawal Time: 0 hours 23 minutes 37 seconds  ?Total Procedure Duration: 0 hours 30 minutes 28 seconds  ?Findings: ?     The perianal and digital rectal examinations were normal. ?     Two sessile polyps were found in the transverse colon and ascending  ?     colon. The polyps were 6 to 8 mm in size. These polyps were removed with  ?     a cold snare. Resection and retrieval were complete. ?     Two sessile polyps were found in the descending colon. The polyps were 6  ?     to 12 mm in size. These polyps were removed with a cold snare. Resection  ?     and retrieval were complete. ?     A fungating, infiltrative and ulcerated partially obstructing large mass  ?     was found at 12 cm proximal to the anus. The mass was circumferential.  ?     The mass measured six cm in length. No bleeding was present. This was  ?     biopsied with a cold forceps for histology. Area distal to the tumor was  ?     successfully injected with 1.5 mL [place] ink for tattooing. ?     Non-bleeding external and internal hemorrhoids were found during  ?     retroflexion. The hemorrhoids were small. ?Impression:               - Two 6 to 8 mm polyps in the transverse colon and  ?                          in the ascending colon, removed with a cold snare.  ?                          Resected and retrieved. ?                          -  Two 6 to 12 mm polyps in the descending colon,  ?                          removed with a cold snare. Resected and retrieved. ?                          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                          12 cm proximal to the anus. Biopsied. Injected. ?                          - Non-bleeding external and internal hemorrhoids. ?Moderate Sedation: ?     Per  Anesthesia Care ?Recommendation:           - Discharge patient to home (ambulatory). ?                          - Resume previous diet. ?                          - Await pathology results. ?                          - Repeat colonoscopy for surveillance based on  ?                          pathology results. ?                          - Perform CT chest/abdomen/pelvis. ?                          - Check CEA level. ?                          - Referral to oncology/general surgery once  ?                          pathology is back. ?Procedure Code(s):        --- Professional --- ?                          [id number], Colonoscopy, flexible; with removal of  ?                          tumor(s), polyp(s), or other lesion(s) by snare  ?                          technique ?                          45381, Colonoscopy, flexible; with directed  ?                          submucosal injection(s), any substance ?  45380, 59, Colonoscopy, flexible; with biopsy,  ?                          single or multiple ?Diagnosis Code(s):        --- Professional --- ?                          K63.5, Polyp of colon ?                          D49.0, Neoplasm of unspecified behavior of  ?                          digestive system ?                          K56.690, Other partial intestinal obstruction ?                          K64.8, Other hemorrhoids ?                          K62.5, Hemorrhage of anus and rectum ?                          D50.9, Iron deficiency anemia, unspecified ?CPT copyright 2019 American Medical Association. All rights reserved. ?The codes documented in this report are preliminary and upon coder review may  ?be revised to meet current compliance requirements. ?Maylon Peppers, MD ?Maylon Peppers,  ?04/06/2022 10:01:53 AM ?This report has been signed electronically. ?Number of Addenda: 0 ?

## 2022-04-06 NOTE — Anesthesia Preprocedure Evaluation (Signed)
Anesthesia Evaluation  ?Patient identified by MRN, date of birth, ID band ?Patient awake ? ? ? ?Reviewed: ?Allergy & Precautions, H&P , NPO status , Patient's Chart, lab work & pertinent test results, reviewed documented beta blocker date and time  ? ?History of Anesthesia Complications ?(+) PONV and history of anesthetic complications ? ?Airway ?Mallampati: II ? ?TM Distance: >3 FB ?Neck ROM: full ? ? ? Dental ?no notable dental hx. ? ?  ?Pulmonary ?asthma , sleep apnea , COPD,  ?  ?Pulmonary exam normal ?breath sounds clear to auscultation ? ? ? ? ? ? Cardiovascular ?Exercise Tolerance: Good ?hypertension, (-) Past MI negative cardio ROS ? ? ?Rhythm:regular Rate:Normal ? ? ?  ?Neuro/Psych ?PSYCHIATRIC DISORDERS Depression negative neurological ROS ?   ? GI/Hepatic ?Neg liver ROS, GERD  Medicated,  ?Endo/Other  ?diabetes, Type 2Hypothyroidism Morbid obesity ? Renal/GU ?negative Renal ROS  ?negative genitourinary ?  ?Musculoskeletal ? ? Abdominal ?  ?Peds ? Hematology ? ?(+) Blood dyscrasia, anemia ,   ?Anesthesia Other Findings ? ? Reproductive/Obstetrics ?negative OB ROS ? ?  ? ? ? ? ? ? ? ? ? ? ? ? ? ?  ?  ? ? ? ? ? ? ? ? ?Anesthesia Physical ?Anesthesia Plan ? ?ASA: 3 ? ?Anesthesia Plan: General  ? ?Post-op Pain Management:   ? ?Induction:  ? ?PONV Risk Score and Plan: Propofol infusion ? ?Airway Management Planned:  ? ?Additional Equipment:  ? ?Intra-op Plan:  ? ?Post-operative Plan:  ? ?Informed Consent: I have reviewed the patients History and Physical, chart, labs and discussed the procedure including the risks, benefits and alternatives for the proposed anesthesia with the patient or authorized representative who has indicated his/her understanding and acceptance.  ? ? ? ?Dental Advisory Given ? ?Plan Discussed with: CRNA ? ?Anesthesia Plan Comments:   ? ? ? ? ? ? ?Anesthesia Quick Evaluation ? ?

## 2022-04-06 NOTE — Transfer of Care (Signed)
Immediate Anesthesia Transfer of Care Note ? ?Patient: [name] ? ?Procedure(s) Performed: COLONOSCOPY WITH PROPOFOL ?ESOPHAGOGASTRODUODENOSCOPY (EGD) WITH PROPOFOL ?POLYPECTOMY ?BIOPSY ?HEMOSTASIS CLIP PLACEMENT ?SUBMUCOSAL TATTOO INJECTION ? ?Patient Location: Short Stay ? ?Anesthesia Type:General ? ?Level of Consciousness: awake ? ?Airway & Oxygen Therapy: Patient Spontanous Breathing ? ?Post-op Assessment: Report given to RN and Post -op Vital signs reviewed and stable ? ?Post vital signs: Reviewed and stable ? ?Last Vitals:  ?Vitals Value Taken Time  ?BP    ?Temp    ?Pulse    ?Resp    ?SpO2    ? ? ?Last Pain:  ?Vitals:  ? 04/06/22 0850  ?PainSc: 0-No pain  ?   ? ?  ? ?Complications: No notable events documented. ?

## 2022-04-06 NOTE — Anesthesia Procedure Notes (Signed)
Date/Time: 04/06/2022 9:00 AM ?Performed by: Orlie Dakin, CRNA ?Pre-anesthesia Checklist: Patient identified, Emergency Drugs available, Suction available and Patient being monitored ?Patient Re-evaluated:Patient Re-evaluated prior to induction ?Oxygen Delivery Method: Nasal cannula ?Induction Type: IV induction ?Placement Confirmation: positive ETCO2 ? ? ? ? ?

## 2022-04-06 NOTE — Interval H&P Note (Signed)
History and Physical Interval Note: ? ?04/06/2022 ?7:31 AM ? ?[name]  has presented today for surgery, with the diagnosis of Iron deficiency anemia.  The various methods of treatment have been discussed with the patient and family. After consideration of risks, benefits and other options for treatment, the patient has consented to  Procedure(s) with comments: ?COLONOSCOPY WITH PROPOFOL (N/A) - 805 ASA 2 patient in [facility] ?ESOPHAGOGASTRODUODENOSCOPY (EGD) WITH PROPOFOL (N/A) as a surgical intervention.  The patient's history has been reviewed, patient examined, no change in status, stable for surgery.  I have reviewed the patient's chart and labs.  Questions were answered to the patient's satisfaction.   ? ? ?[name] ? ? ?

## 2022-04-07 LAB — CEA: CEA: 25.7 ng/mL — ABNORMAL HIGH (ref 0.0–4.7)

## 2022-04-07 LAB — SURGICAL PATHOLOGY

## 2022-04-08 ENCOUNTER — Other Ambulatory Visit (INDEPENDENT_AMBULATORY_CARE_PROVIDER_SITE_OTHER): Payer: Self-pay

## 2022-04-08 DIAGNOSIS — C189 Malignant neoplasm of colon, unspecified: Secondary | ICD-10-CM

## 2022-04-21 ENCOUNTER — Encounter (HOSPITAL_COMMUNITY): Payer: Self-pay | Admitting: Gastroenterology

## 2022-04-27 ENCOUNTER — Ambulatory Visit (HOSPITAL_COMMUNITY)
Admission: RE | Admit: 2022-04-27 | Discharge: 2022-04-27 | Disposition: A | Discharge status: Home / Self Care | Payer: Medicare HMO | Source: Ambulatory Visit | Attending: Gastroenterology | Admitting: Gastroenterology

## 2022-04-27 DIAGNOSIS — C189 Malignant neoplasm of colon, unspecified: Secondary | ICD-10-CM | POA: Insufficient documentation

## 2022-04-27 IMAGING — CT CT CHEST-ABD-PELV W/ CM
3 of 5 series · 15 of 36 positions shown, 17 images · IV contrast (Omni 300)
Comparison: CT chest, [DATE], CT abdomen pelvis, [DATE]

CLINICAL DATA: Colon cancer surveillance * Tracking Code: BO *

EXAM:
CT CHEST, ABDOMEN, AND PELVIS WITH CONTRAST
TECHNIQUE: Multidetector CT imaging of the chest, abdomen and pelvis was
performed following the standard protocol during bolus
administration of intravenous contrast.

[Series 3: cap with 5mm st · axial · 0.89mm/px · z∈[+739,+1279]mm · 10 of 134 slices shown, 12 images]
[im 13/134  mediastinal]
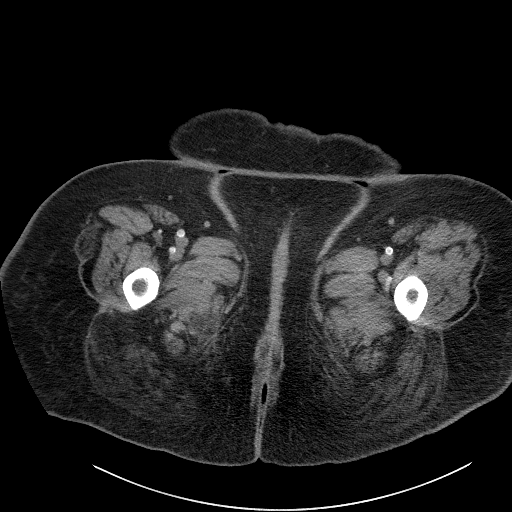
[im 13/134  bone]
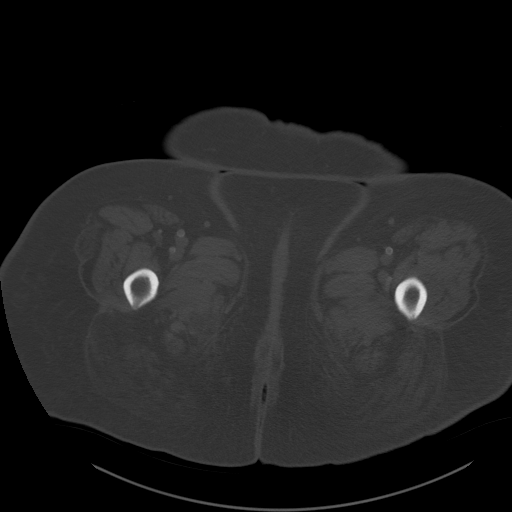
[im 25/134  mediastinal]
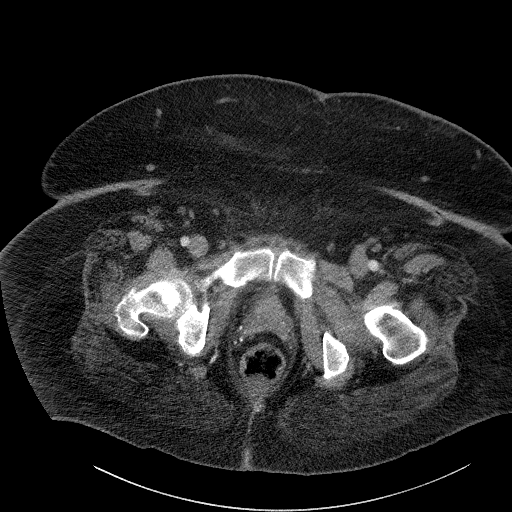
[im 37/134  mediastinal]
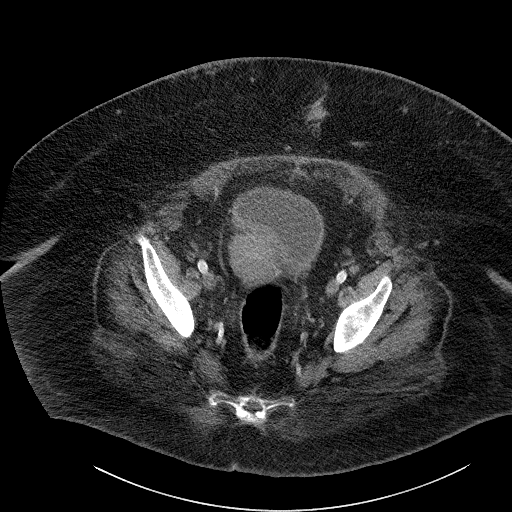
[im 49/134  mediastinal]
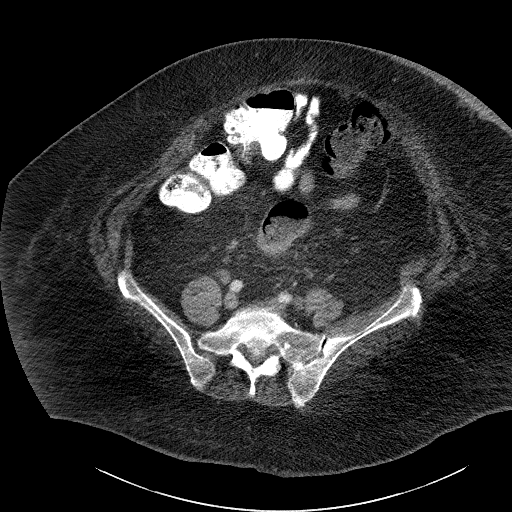
[im 61/134  mediastinal]
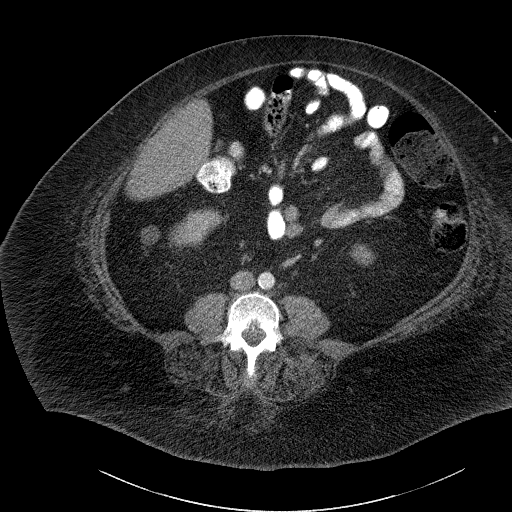
[im 73/134  mediastinal]
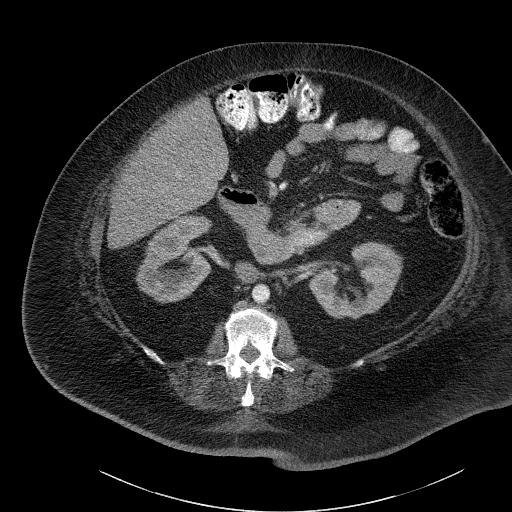
[im 85/134  mediastinal]
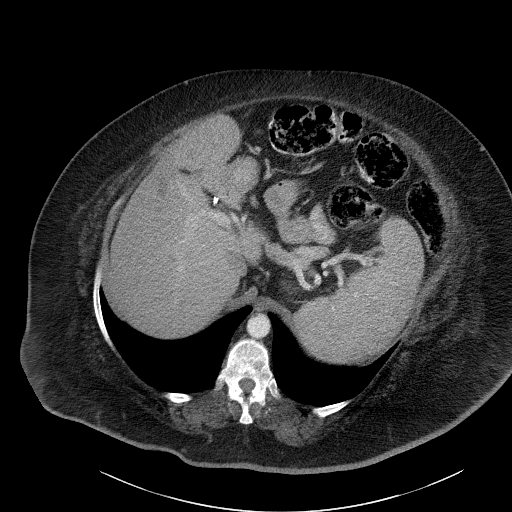
[im 97/134  mediastinal]
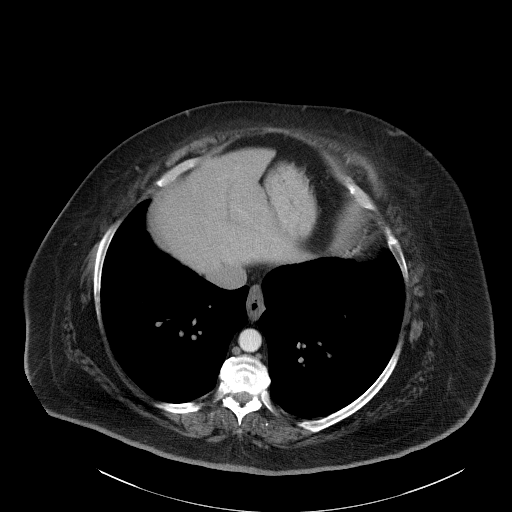
[im 109/134  mediastinal]
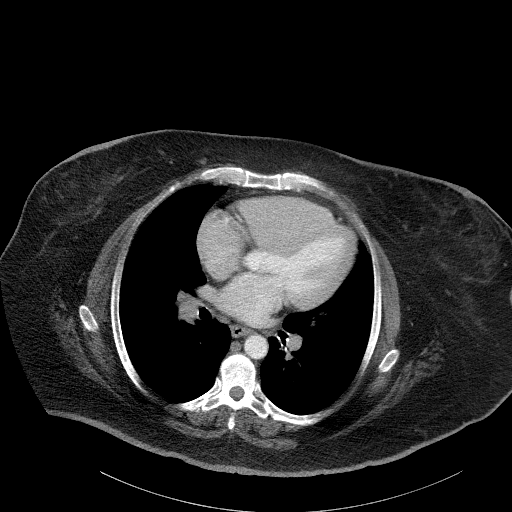
[im 109/134  bone]
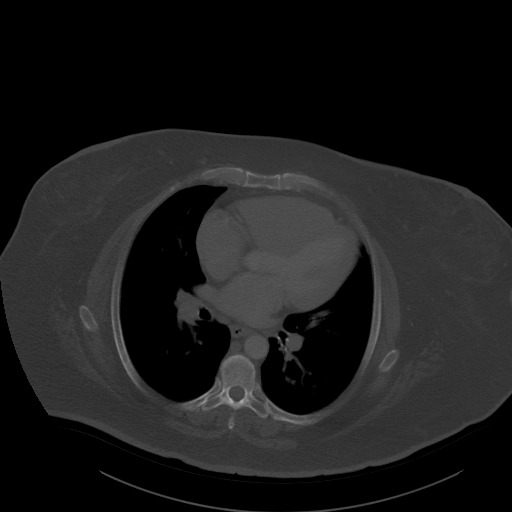
[im 121/134  mediastinal]
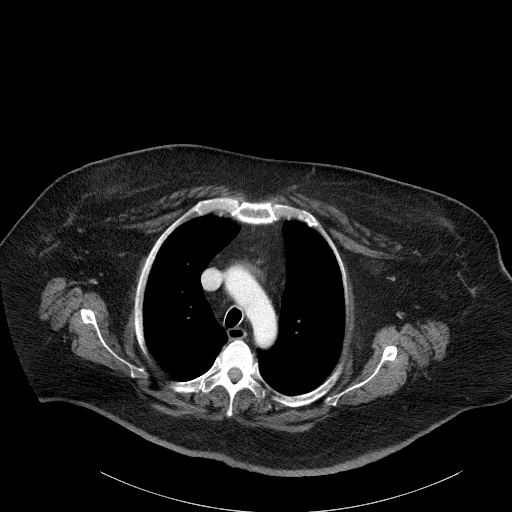

[Series 5: lung · axial · 0.87mm/px · z∈[+1056,+1104]mm · 2 of 156 slices shown]
[im 12/156  bone]
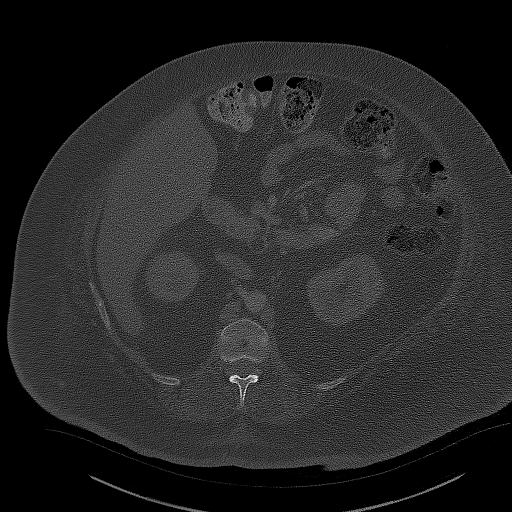
[im 36/156  bone]
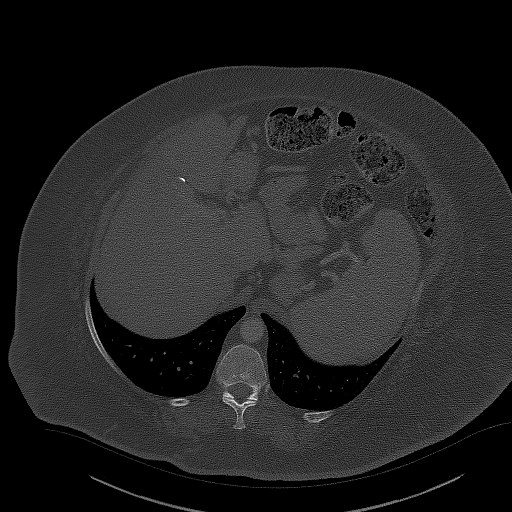

[Series 7: cap with 3mm st cor · coronal · 0.95mm/px · 3 of 173 slices shown]
[im 35/173  mediastinal]
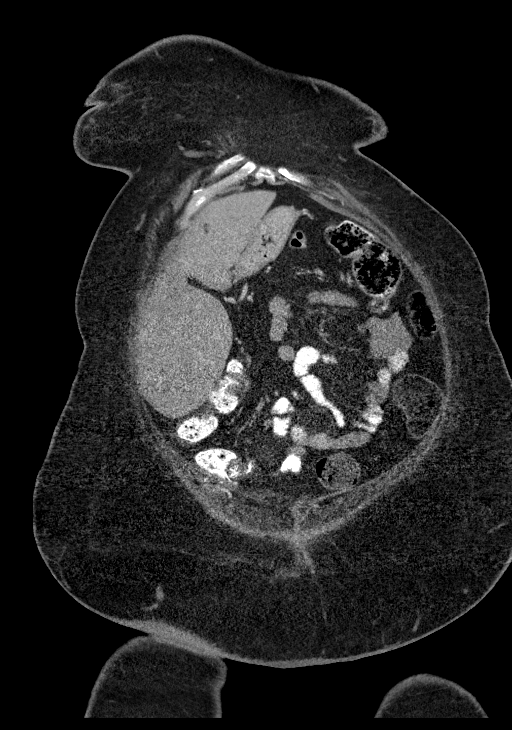
[im 69/173  mediastinal]
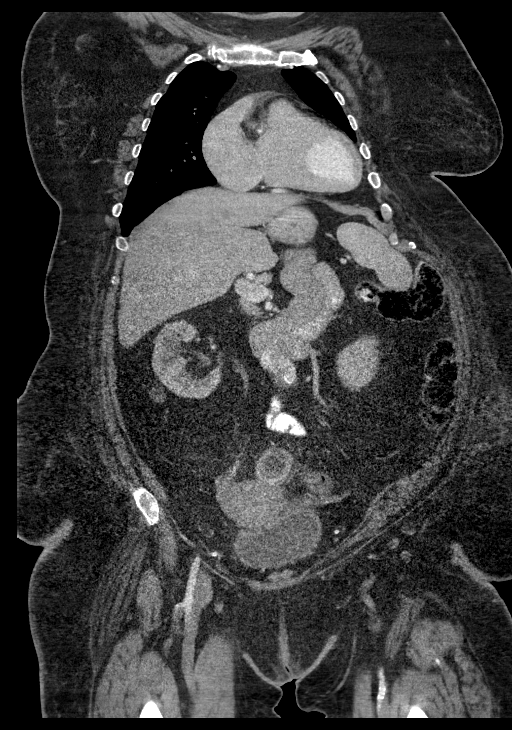
[im 104/173  mediastinal]
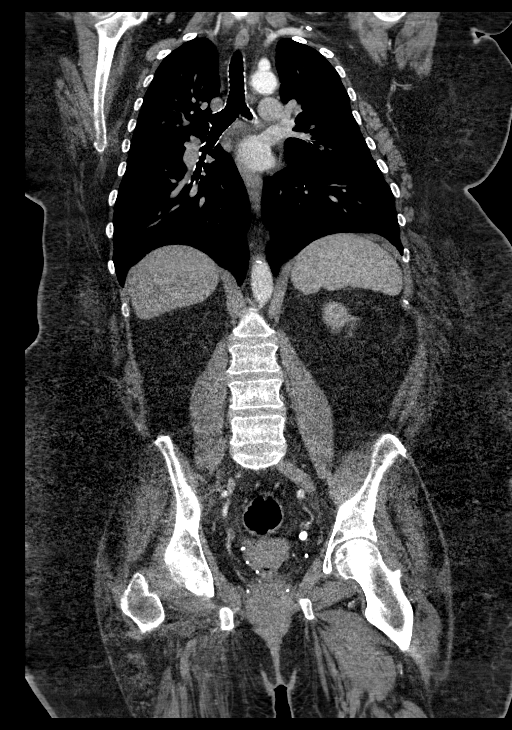

[15 of 36 positions shown; findings below may reference images not displayed]

RADIATION DOSE REDUCTION: This exam was performed according to the
departmental dose-optimization program which includes automated
exposure control, adjustment of the mA and/or kV according to
patient size and/or use of iterative reconstruction technique.

CONTRAST:  70mL OMNIPAQUE IOHEXOL 300 MG/ML SOLN, additional oral
enteric contrast
FINDINGS: CT CHEST FINDINGS

Cardiovascular: No significant vascular findings. Mild cardiomegaly.
Three-vessel coronary artery calcifications. No pericardial
effusion.

Mediastinum/Nodes: No enlarged mediastinal, hilar, or axillary lymph
nodes. Thyroid gland, trachea, and esophagus demonstrate no
significant findings.

Lungs/Pleura: Clustered subsolid nodular opacities in the superior
segment left lower lobe measuring up to 0.6 cm (series 5, image 60).
Previously seen extensive bilateral heterogeneous and ground-glass
airspace disease is resolved. No pleural effusion or pneumothorax.

Musculoskeletal: No chest wall mass or suspicious osseous lesions
identified.

CT ABDOMEN PELVIS FINDINGS

Hepatobiliary: No focal liver abnormality is seen. Status post
cholecystectomy. No biliary dilatation.

Pancreas: Unremarkable. No pancreatic ductal dilatation or
surrounding inflammatory changes.

Spleen: Normal in size without significant abnormality.

Adrenals/Urinary Tract: Adrenal glands are unremarkable. Kidneys are
normal, without renal calculi, solid lesion, or hydronephrosis.
Bladder is unremarkable.

Stomach/Bowel: Stomach is within normal limits. Appendix appears
normal. No evidence of bowel wall thickening, distention, or
inflammatory changes.

Vascular/Lymphatic: Aortic atherosclerosis. No enlarged abdominal or
pelvic lymph nodes.

Reproductive: No mass or other abnormality.

Other: No abdominal wall hernia. Similar skin thickening about the
lower abdominal quadrants, most likely related to injection
granulomata. No ascites.

Musculoskeletal: No acute osseous findings.
IMPRESSION: 1. No evidence of mass, lymphadenopathy or metastatic disease in the
chest, abdomen, or pelvis.
2. Clustered subsolid nodular opacities in the superior segment left
lower lobe measuring up to 0.6 cm. Previously seen extensive
bilateral heterogeneous and ground-glass airspace disease is
resolved. Findings are most consistent with minimal residual or
recurrent infection or inflammation. Attention on follow-up.
3. Coronary artery disease.

Aortic Atherosclerosis ([TL]-[TL]).

## 2022-04-27 MED ORDER — IOHEXOL 300 MG/ML  SOLN
70.0000 mL | Freq: Once | INTRAMUSCULAR | Status: AC | PRN
  Administered 2022-04-27: 70 mL via INTRAVENOUS

## 2022-05-10 ENCOUNTER — Ambulatory Visit (HOSPITAL_COMMUNITY)
Admission: RE | Admit: 2022-05-10 | Discharge: 2022-05-10 | Disposition: A | Discharge status: Home / Self Care | Payer: Medicare HMO | Source: Ambulatory Visit | Attending: Gastroenterology | Admitting: Gastroenterology

## 2022-05-10 DIAGNOSIS — C189 Malignant neoplasm of colon, unspecified: Secondary | ICD-10-CM | POA: Insufficient documentation

## 2022-05-10 IMAGING — MR MR PELVIS WO/W CM
13 of 14 series · 44 of 48 positions shown · IV contrast (gadavist)
Comparison: Colonoscopy no [DATE]. Chest abdomen and pelvic CTs
of [DATE].

CLINICAL DATA: Fungating mass on colonoscopy approximately 12 cm
proximal to the anus.

EXAM:
MRI PELVIS WITHOUT AND WITH CONTRAST
TECHNIQUE: Multiplanar multisequence MR imaging of the pelvis was performed
both before and after administration of intravenous contrast.
CONTRAST:  9mL GADAVIST GADOBUTROL 1 MMOL/ML IV SOLN

[Series 2: T2 · sagittal · 3.0mm · 1.19mm/px · 3 of 35 slices shown (1 of 4)]
[im 1/35]
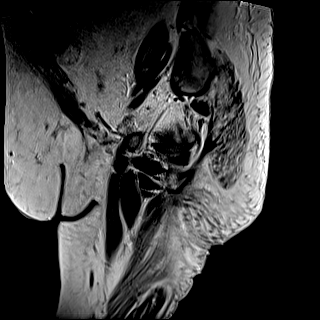
[im 18/35]
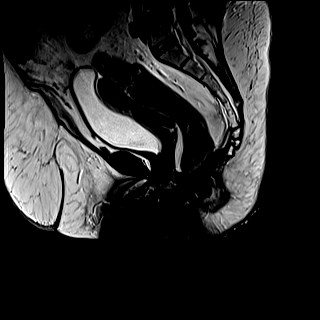
[im 35/35]
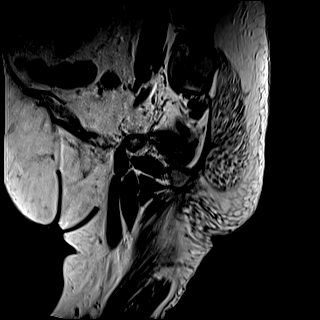

[Series 3: T1 dynamic fat-sat · axial · non-contrast · 3.0mm · 1.19mm/px · z∈[-52,+185]mm · 6 of 80 slices shown (1 of 2)]
[im 1/80]
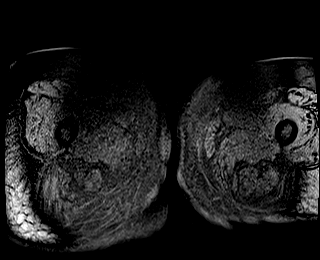
[im 16/80]
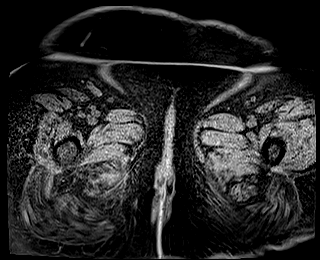
[im 32/80]
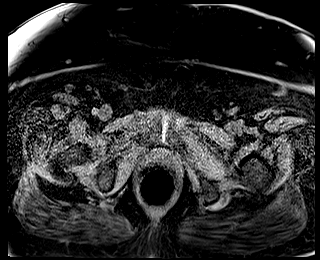
[im 48/80]
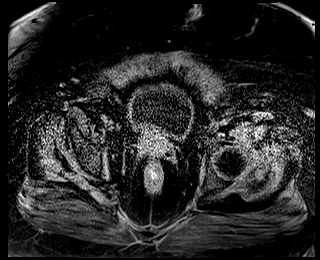
[im 64/80]
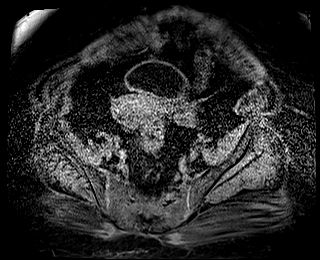
[im 80/80]
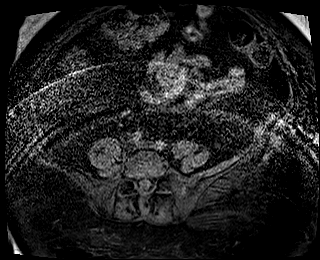

[Series 4: T2 · coronal · 3.0mm · 0.75mm/px · 4 of 48 slices shown (2 of 4)]
[im 1/48]
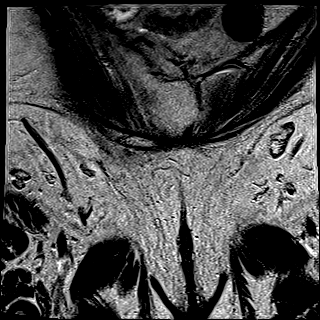
[im 16/48]
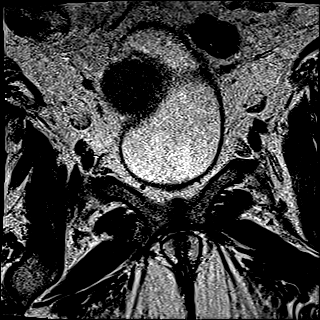
[im 32/48]
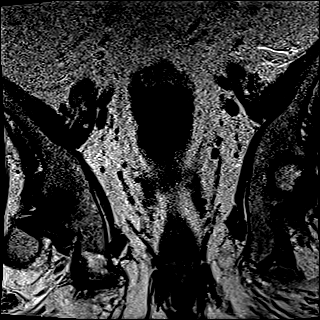
[im 48/48]
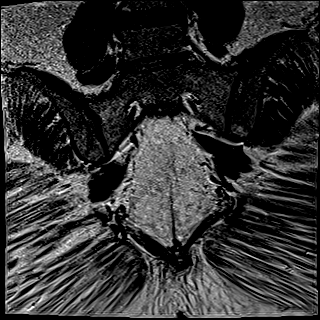

[Series 5: T2 · axial · 5.0mm · 1.31mm/px · z∈[-46,+188]mm · 3 of 40 slices shown (3 of 4)]
[im 1/40]
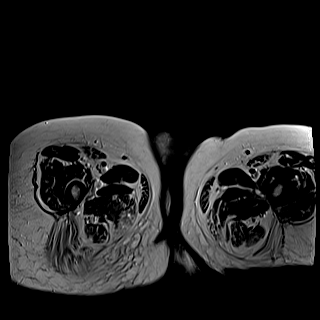
[im 20/40]
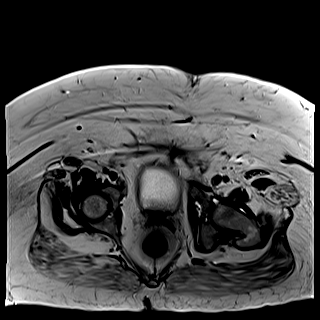
[im 40/40]
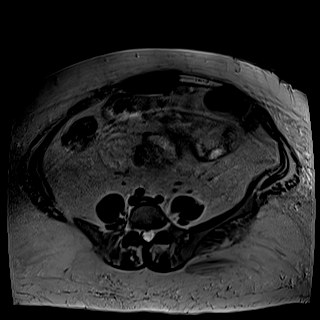

[Series 6: DWI · axial · 6.0mm · 1.23mm/px · z∈[-32,+177]mm · 2 of 30 slices shown (1 of 4)]
[im 1/30]
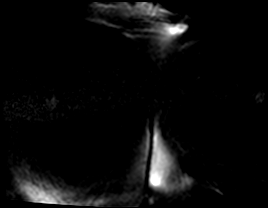
[im 30/30]
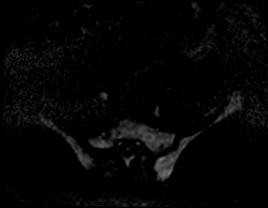

[Series 6: DWI · axial · 6.0mm · 1.23mm/px · z∈[-32,+177]mm · 2 of 30 slices shown (2 of 4)]
[im 1/30]
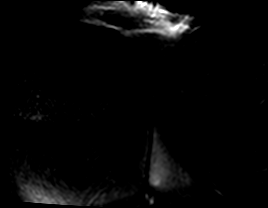
[im 30/30]
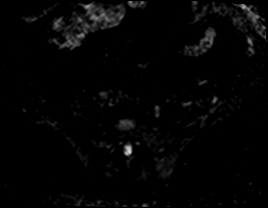

[Series 6: DWI · axial · 6.0mm · 1.23mm/px · z∈[-32,+177]mm · 2 of 30 slices shown (3 of 4)]
[im 1/30]
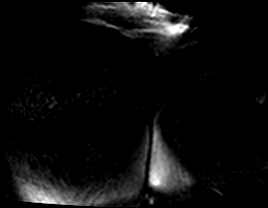
[im 30/30]
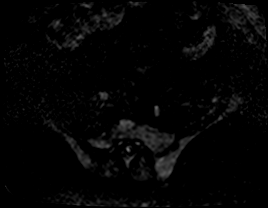

[Series 7: DWI · axial · 6.0mm · 1.23mm/px · z∈[-32,+177]mm · 2 of 30 slices shown (4 of 4)]
[im 1/30]
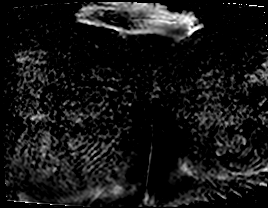
[im 30/30]
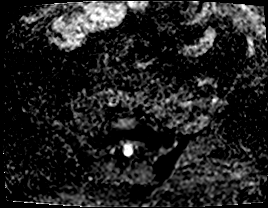

[Series 8: T2 · axial · 3.0mm · 0.62mm/px · z∈[-22,+104]mm · 3 of 45 slices shown (4 of 4)]
[im 1/45]
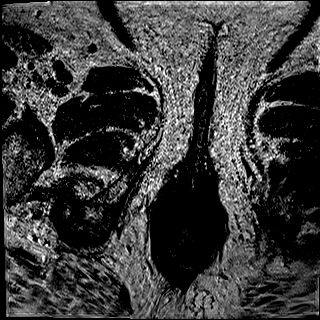
[im 23/45]
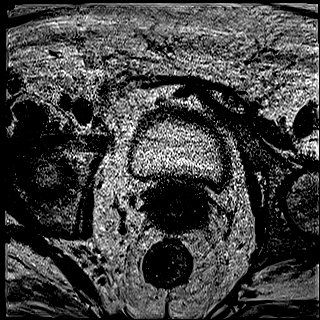
[im 45/45]
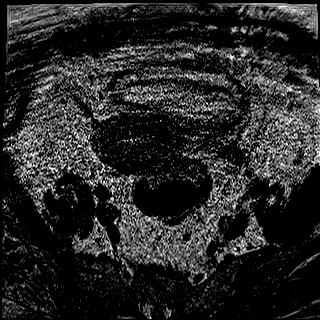

[Series 10: ax t1fs · axial · 3.0mm · 0.62mm/px · z∈[-29,+112]mm · 3 of 45 slices shown]
[im 1/45]
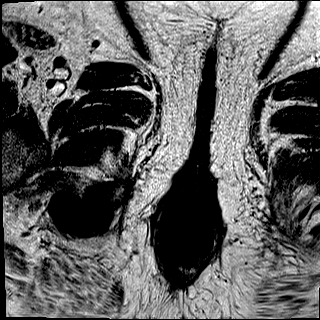
[im 23/45]
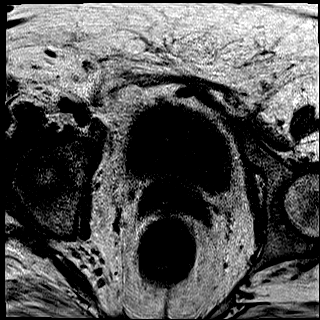
[im 45/45]
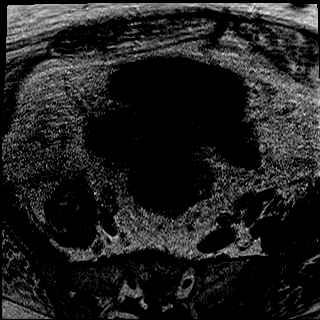

[Series 11: T1 dynamic fat-sat post-contrast · axial · 3.0mm · 1.19mm/px · z∈[-52,+185]mm · 6 of 80 slices shown]
[im 1/80]
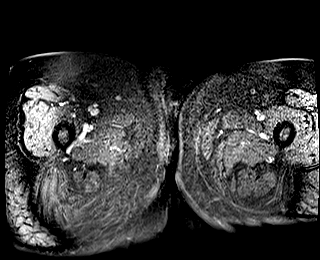
[im 16/80]
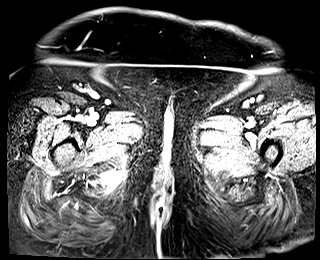
[im 32/80]
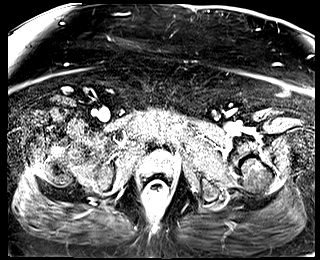
[im 48/80]
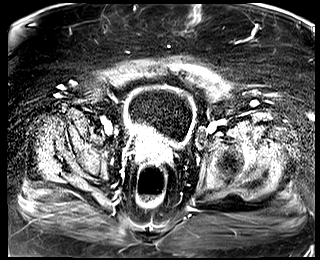
[im 64/80]
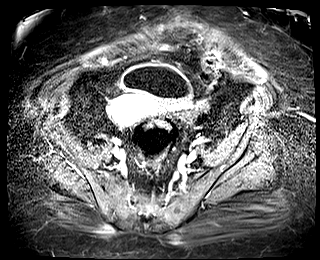
[im 80/80]
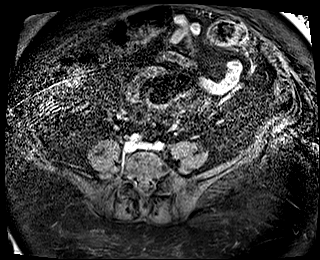

[Series 12: T1 dynamic fat-sat · axial · 3.0mm · 1.19mm/px · z∈[-52,+185]mm · 6 of 80 slices shown (2 of 2)]
[im 1/80]
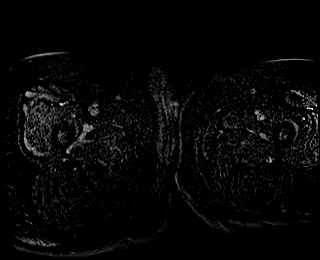
[im 16/80]
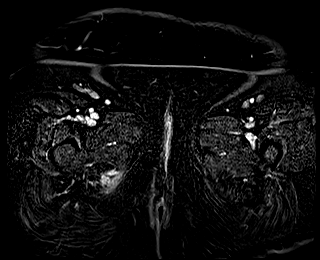
[im 32/80]
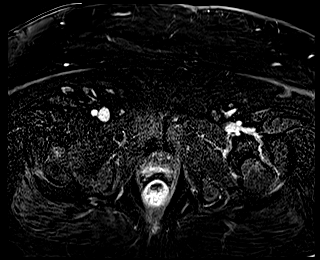
[im 48/80]
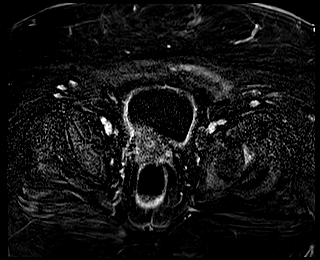
[im 64/80]
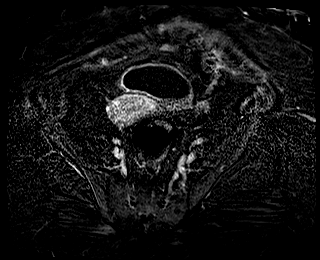
[im 80/80]
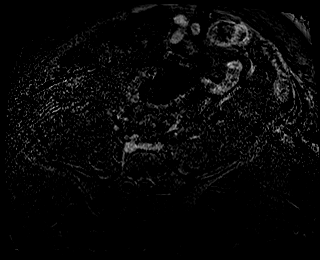

[Series 13: T1 fat-sat post-contrast · coronal · 4.0mm · 0.38mm/px · 2 of 40 slices shown]
[im 1/40]
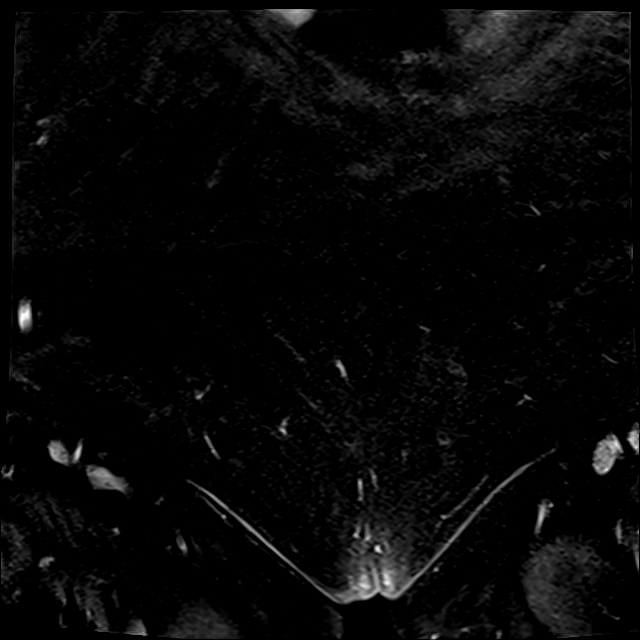
[im 20/40]
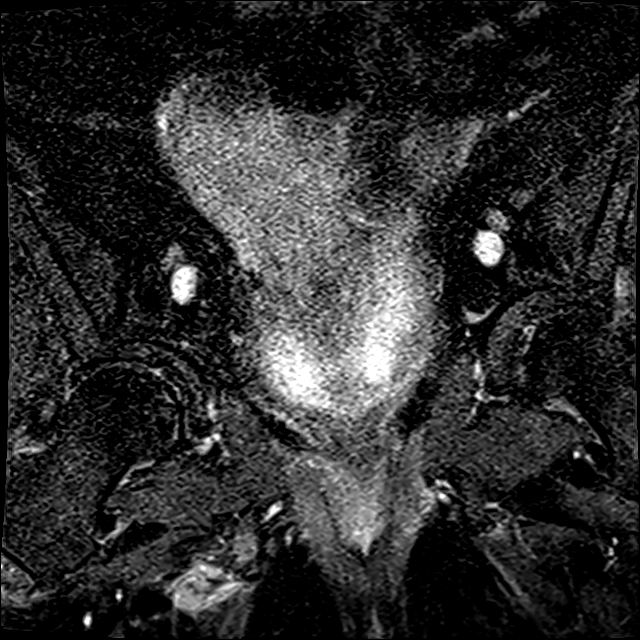

[44 of 48 positions shown; findings below may reference images not displayed]

FINDINGS: Of note, secondary to the dominant mass positioned in the sigmoid, a
rectal cancer staging template was not used. However, the rectal
cancer protocol was performed. This fat, as well as patient body
habitus and motion cause moderate exam degradation.

Urinary Tract:  Normal urinary bladder.  No hydroureter.

Bowel:  Normal small bowel caliber.

Although there is no high-grade obstruction, the upstream colon is
stool-filled.

A mid to distal sigmoid mass is identified at approximately 15 cm
from the anal verge on [DATE]. This extends for an approximately
cm length on this image. Circumferential, including on images 6
through 8 of series 5.

Suspicion of extension into the surrounding sigmoid mesocolon,
including on [DATE] and especially eccentric right on [DATE].

Intimate association between this mass and the adjacent uterine
fundus, including on [DATE] and [DATE]. No gross uterine invasion.

Vascular/Lymphatic: No pelvic aneurysm.

A node within the left side of the sigmoid mesocolon measures 6 mm
on [DATE] and is suspicious based on location. Right external iliac 8
mm node on [DATE] is not in the primary drainage pathway for sigmoid
colon.

Similarly, right external iliac 1.0 cm node on 39/11 is favored to
be reactive.

Reproductive: Small nabothian cysts. Normal uterus and endometrium.
No adnexal mass.

Other:  No significant free fluid.  No pelvic peritoneal metastasis.

Musculoskeletal: No acute osseous abnormality.
IMPRESSION: 1. Please see above limitations secondary to patient body habitus,
motion, as well as exam being protocoled to evaluate a rectal cancer
and the lesion being positioned in the sigmoid.
2. 4 cm sigmoid carcinoma with findings suspicious for small volume
extension into the sigmoid mesocolon. Intimate association with the
adjacent uterus, without gross invasion. Cannot exclude low-grade
partial obstruction, given upstream stool burden.
3. Sigmoid mesocolon node, borderline size but suspicious based on
location. Right external iliac nodes are favored to be reactive.

## 2022-05-10 MED ORDER — GADOBUTROL 1 MMOL/ML IV SOLN
9.0000 mL | Freq: Once | INTRAVENOUS | Status: AC | PRN
  Administered 2022-05-10: 9 mL via INTRAVENOUS

## 2022-05-12 DIAGNOSIS — C187 Malignant neoplasm of sigmoid colon: Secondary | ICD-10-CM | POA: Insufficient documentation

## 2022-05-13 ENCOUNTER — Encounter (HOSPITAL_COMMUNITY): Payer: Self-pay

## 2022-05-13 ENCOUNTER — Inpatient Hospital Stay (HOSPITAL_COMMUNITY): Payer: Medicare HMO | Attending: Hematology | Admitting: Hematology

## 2022-05-13 ENCOUNTER — Encounter (HOSPITAL_COMMUNITY): Payer: Self-pay | Admitting: Hematology

## 2022-05-13 DIAGNOSIS — E119 Type 2 diabetes mellitus without complications: Secondary | ICD-10-CM | POA: Insufficient documentation

## 2022-05-13 DIAGNOSIS — Z800 Family history of malignant neoplasm of digestive organs: Secondary | ICD-10-CM | POA: Insufficient documentation

## 2022-05-13 DIAGNOSIS — C187 Malignant neoplasm of sigmoid colon: Secondary | ICD-10-CM | POA: Insufficient documentation

## 2022-05-13 NOTE — Progress Notes (Signed)
[facility] [address], [place] [address]   CLINIC:  Medical Oncology/Hematology  CONSULT NOTE  Patient Care Team: Bridget Hartshorn, NP as PCP - General (Adult Health Nurse Practitioner) Danie Binder, MD (Inactive) as Consulting Physician (Gastroenterology) Derek Jack, MD as Medical Oncologist (Medical Oncology) Brien Mates, RN as Oncology Nurse Navigator (Medical Oncology)  CHIEF COMPLAINTS/PURPOSE OF CONSULTATION:  Evaluation of cancer of sigmoid colon  HISTORY OF PRESENTING ILLNESS:  [name] 61 y.o. female is here because of evaluation of cancer of sigmoid colon, at the request of Dr. Marcello Moores.  Today she reports feeling good, and she is accompanied by her husband. Prior to her colonoscopy on 04/06/2022 she had rectal bleeding. She denies prior history of cancer. She denies new pains. She is able to walk short distance with a walker. She denies CVA and MI. 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   She is currently living at [facility], and she has been living there for the pat 3 months due to recurrent falls due to right foot drop. Prior to retirement she worked in activities at [employer]. She denies personal smoking history, but reports second-hand smoking exposure from her husband. Her maternal grandmother had colon cancer.   MEDICAL HISTORY:  Past Medical History:  Diagnosis Date   Asthma    COPD (chronic obstructive pulmonary disease) ([place])    Depression    Diabetes mellitus    Diastolic dysfunction 29/93/7169   Generalized weakness    Hyperlipidemia    Hypertension    Hypothyroidism    Obesity    Palpitations    PONV (postoperative nausea and vomiting)    Prolonged QT interval 05/14/2015   Sleep apnea     SURGICAL HISTORY: Past Surgical  History:  Procedure Laterality Date   BIOPSY  04/06/2022   Procedure: BIOPSY;  Surgeon: Harvel Quale, MD;  [facility];  Service: Gastroenterology;;   CATARACT EXTRACTION     CESAREAN SECTION     CHOLECYSTECTOMY     COLONOSCOPY WITH PROPOFOL N/A 04/06/2022   Procedure: COLONOSCOPY WITH PROPOFOL;  Surgeon: Harvel Quale, MD;  [facility];  Service: Gastroenterology;  Laterality: N/A;  805 ASA 2 patient in [place]   ESOPHAGOGASTRODUODENOSCOPY (EGD) WITH PROPOFOL N/A 04/06/2022   Procedure: ESOPHAGOGASTRODUODENOSCOPY (EGD) WITH PROPOFOL;  Surgeon: Harvel Quale, MD;  [facility];  Service: Gastroenterology;  Laterality: N/A;   HEMOSTASIS CLIP PLACEMENT  04/06/2022   Procedure: HEMOSTASIS CLIP PLACEMENT;  Surgeon: Harvel Quale, MD;  [facility];  Service: Gastroenterology;;   POLYPECTOMY  04/06/2022   Procedure: POLYPECTOMY;  Surgeon: Montez Morita, Quillian Quince, MD;  [facility];  Service: Gastroenterology;;   SUBMUCOSAL TATTOO INJECTION  04/06/2022   Procedure: SUBMUCOSAL TATTOO INJECTION;  Surgeon: Harvel Quale, MD;  [facility];  Service: Gastroenterology;;    SOCIAL HISTORY: Social History   Socioeconomic History   Marital status: Married    Spouse name: Not on file   Number of children: Not on file   Years of education: Not on file   Highest education level: Not on file  Occupational History   Not on file  Tobacco Use   Smoking status: Never    Passive exposure: Yes  Smokeless tobacco: Never  Vaping Use   Vaping Use: Never used  Substance and Sexual Activity   Alcohol use: No    Alcohol/week: 0.0 standard drinks of alcohol   Drug use: No   Sexual activity: Yes    Birth control/protection: None  Other Topics Concern   Not on file  Social History Narrative   Lives with husband.  One living son.  Daughter died after transplant age  24.     Social Determinants of Health   Financial Resource Strain: Not on file  Food Insecurity: Not on file  Transportation Needs: Not on file  Physical Activity: Not on file  Stress: Not on file  Social Connections: Not on file  Intimate Partner Violence: Not on file    FAMILY HISTORY: Family History  Problem Relation Age of Onset   Stroke Mother    Diabetes Father    Heart failure Father    Hypertension Father    Diabetes Sister    Heart failure Sister    Hypertension Sister    Stroke Sister    Cancer Other    Stroke Sister     ALLERGIES:  is allergic to carvedilol, benicar [olmesartan], codeine, sulfa antibiotics, and trulicity [dulaglutide].  MEDICATIONS:  Current Outpatient Medications  Medication Sig Dispense Refill   ADMELOG 100 UNIT/ML injection Inject into the skin.     albuterol (VENTOLIN HFA) 108 (90 Base) MCG/ACT inhaler Inhale 2 puffs into the lungs every 6 (six) hours as needed for wheezing or shortness of breath.     amLODipine (NORVASC) 5 MG tablet Take 1 tablet (5 mg total) by mouth daily. (Patient taking differently: Take 10 mg by mouth daily.)     atorvastatin (LIPITOR) 10 MG tablet Take 10 mg by mouth at bedtime.     fosfomycin (MONUROL) 3 g PACK Take by mouth.     furosemide (LASIX) 20 MG tablet Take 20 mg by mouth daily.     gabapentin (NEURONTIN) 100 MG capsule Take 1 capsule (100 mg total) by mouth 3 (three) times daily.     Glucerna (GLUCERNA) LIQD Take 237 mLs by mouth daily. For wound healing     hydrALAZINE (APRESOLINE) 25 MG tablet Take 1 tablet (25 mg total) by mouth every 8 (eight) hours.     hydrOXYzine (ATARAX/VISTARIL) 10 MG tablet Take 10 mg by mouth every 8 (eight) hours as needed for itching or anxiety.      insulin glargine-yfgn (SEMGLEE) 100 UNIT/ML injection Inject 0.4 mLs (40 Units total) into the skin daily. 10 mL 11   levothyroxine (SYNTHROID) 88 MCG tablet Take 88 mcg by mouth daily before breakfast.     loperamide (IMODIUM  A-D) 2 MG tablet Take 2 mg by mouth. One prn. Do not exceed 4 per day     loratadine (CLARITIN) 10 MG tablet Take 10 mg by mouth daily.     magnesium oxide (MAG-OX) 400 (240 Mg) MG tablet Take 1 tablet by mouth daily.     Multiple Vitamin (MULTIVITAMIN) tablet Take 1 tablet by mouth daily.     Nystatin (GERHARDT'S BUTT CREAM) CREA As directed     nystatin (MYCOSTATIN/NYSTOP) powder Apply topically 2 (two) times daily. 15 g 0   omeprazole (PRILOSEC) 20 MG capsule Take 20 mg by mouth 2 (two) times daily before a meal.     ondansetron (ZOFRAN) 8 MG tablet Take by mouth.     OVER THE COUNTER MEDICATION Vitamin C 500 mg bid for wound healing  Vit  D3 50 mcg - 2,000 iu once daily  Zince - 220 one in the morning     potassium chloride SA (K-DUR) 20 MEQ tablet Take 20 mEq by mouth daily.     promethazine (PHENERGAN) 25 MG/ML injection Inject into the muscle.     propranolol ER (INDERAL LA) 80 MG 24 hr capsule Take 80 mg by mouth daily.     SANTYL 250 UNIT/GM ointment Apply topically.     sertraline (ZOLOFT) 50 MG tablet Take 3 tablets (150 mg total) by mouth daily. (Patient taking differently: Take 50 mg by mouth daily.)     No current facility-administered medications for this visit.    REVIEW OF SYSTEMS:   Review of Systems  Constitutional:  Negative for appetite change and fatigue.  Respiratory:  Positive for cough.   Gastrointestinal:  Positive for blood in stool (rectal bleeding) and diarrhea.  Genitourinary:  Positive for dysuria and pelvic pain (groin).   Neurological:  Positive for dizziness and numbness (hands and R foot).  All other systems reviewed and are negative.    PHYSICAL EXAMINATION: ECOG PERFORMANCE STATUS: 1 - Symptomatic but completely ambulatory  Vitals:   05/13/22 0808  BP: (!) 148/61  Pulse: 65  Resp: 16  Temp: 97.6 F (36.4 C)  SpO2: 96%   Filed Weights   05/13/22 0808  Weight: 263 lb 3.2 oz (119.4 kg)   Physical Exam Vitals reviewed.   Constitutional:      Appearance: Normal appearance. She is obese.     Comments: In wheelchair  Cardiovascular:     Rate and Rhythm: Normal rate and regular rhythm.     Pulses: Normal pulses.     Heart sounds: Normal heart sounds.  Pulmonary:     Effort: Pulmonary effort is normal.     Breath sounds: Normal breath sounds.  Musculoskeletal:     Right lower leg: No edema.     Left lower leg: No edema.  Neurological:     General: No focal deficit present.     Mental Status: She is alert and oriented to person, place, and time.  Psychiatric:        Mood and Affect: Mood normal.        Behavior: Behavior normal.      LABORATORY DATA:  I have reviewed the data as listed    Latest Ref Rng & Units 04/01/2022   10:32 AM 12/19/2021    5:07 AM 12/18/2021    4:13 AM  CBC  WBC 4.0 - 10.5 K/uL 12.0  8.4  7.5   Hemoglobin 12.0 - 15.0 g/dL 10.8  10.4  11.3   Hematocrit 36.0 - 46.0 % 33.8  32.1  34.3   Platelets 150 - 400 K/uL 409  223  255       Latest Ref Rng & Units 04/01/2022   10:32 AM 12/19/2021    5:07 AM 12/18/2021    4:13 AM  CMP  Glucose 70 - 99 mg/dL 241  183  251   BUN 6 - 20 mg/dL 25  37  35   Creatinine 0.44 - 1.00 mg/dL 1.28  1.50  1.64   Sodium 135 - 145 mmol/L 140  138  136   Potassium 3.5 - 5.1 mmol/L 4.1  3.4  3.4   Chloride 98 - 111 mmol/L 103  101  98   CO2 22 - 32 mmol/L '30  30  30   '$ Calcium 8.9 - 10.3 mg/dL 9.3  8.4  8.2  RADIOGRAPHIC STUDIES: I have personally reviewed the radiological images as listed and agreed with the findings in the report. MR PELVIS W WO CONTRAST  Result Date: 05/10/2022 CLINICAL DATA:  Fungating mass on colonoscopy approximately 12 cm proximal to the anus. EXAM: MRI PELVIS WITHOUT AND WITH CONTRAST TECHNIQUE: Multiplanar multisequence MR imaging of the pelvis was performed both before and after administration of intravenous contrast. CONTRAST:  68m GADAVIST GADOBUTROL 1 MMOL/ML IV SOLN COMPARISON:  Colonoscopy no 04/06/2022. Chest  abdomen and pelvic CTs of 04/27/2022. FINDINGS: Of note, secondary to the dominant mass positioned in the sigmoid, a rectal cancer staging template was not used. However, the rectal cancer protocol was performed. This fat, as well as patient body habitus and motion cause moderate exam degradation. Urinary Tract:  Normal urinary bladder.  No hydroureter. Bowel:  Normal small bowel caliber. Although there is no high-grade obstruction, the upstream colon is stool-filled. A mid to distal sigmoid mass is identified at approximately 15 cm from the anal verge on 20/2. This extends for an approximately 4.0 cm length on this image. Circumferential, including on images 6 through 8 of series 5. Suspicion of extension into the surrounding sigmoid mesocolon, including on 05/11 and especially eccentric right on 08/11. Intimate association between this mass and the adjacent uterine fundus, including on 16/2 and 14/11. No gross uterine invasion. Vascular/Lymphatic: No pelvic aneurysm. A node within the left side of the sigmoid mesocolon measures 6 mm on 04/05 and is suspicious based on location. Right external iliac 8 mm node on 04/05 is not in the primary drainage pathway for sigmoid colon. Similarly, right external iliac 1.0 cm node on 39/11 is favored to be reactive. Reproductive: Small nabothian cysts. Normal uterus and endometrium. No adnexal mass. Other:  No significant free fluid.  No pelvic peritoneal metastasis. Musculoskeletal: No acute osseous abnormality. IMPRESSION: 1. Please see above limitations secondary to patient body habitus, motion, as well as exam being protocoled to evaluate a rectal cancer and the lesion being positioned in the sigmoid. 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 extension into the sigmoid mesocolon. Intimate association with the adjacent uterus, without gross invasion. Cannot exclude low-grade partial obstruction, given upstream stool burden. 3. Sigmoid mesocolon  node, borderline size but suspicious based on location. Right external iliac nodes are favored to be reactive. Electronically Signed   By: KAbigail MiyamotoM.D.   On: 05/10/2022 16:03   CT CHEST ABDOMEN PELVIS W CONTRAST  Result Date: 04/27/2022 CLINICAL DATA:  Colon cancer surveillance * Tracking Code: BO * EXAM: CT CHEST, ABDOMEN, AND PELVIS WITH CONTRAST TECHNIQUE: Multidetector CT imaging of the chest, abdomen and pelvis was performed following the standard protocol during bolus administration of intravenous contrast. RADIATION DOSE REDUCTION: This exam was performed according to the departmental dose-optimization program which includes automated exposure control, adjustment of the mA and/or kV according to patient size and/or use of iterative reconstruction technique. CONTRAST:  769mOMNIPAQUE IOHEXOL 300 MG/ML SOLN, additional oral enteric contrast COMPARISON:  CT chest, 12/16/2021, CT abdomen pelvis, 11/21/2021 FINDINGS: CT CHEST FINDINGS Cardiovascular: No significant vascular findings. Mild cardiomegaly. Three-vessel coronary artery calcifications. No pericardial effusion. Mediastinum/Nodes: No enlarged mediastinal, hilar, or axillary lymph nodes. Thyroid gland, trachea, and esophagus demonstrate no significant findings. Lungs/Pleura: Clustered subsolid nodular opacities in the superior segment left lower lobe measuring up to 0.6 cm (series 5, image 60). Previously seen extensive bilateral heterogeneous and ground-glass airspace disease is resolved. No pleural effusion or pneumothorax. Musculoskeletal: No chest wall  mass or suspicious osseous lesions identified. CT ABDOMEN PELVIS FINDINGS Hepatobiliary: No focal liver abnormality is seen. Status post cholecystectomy. No biliary dilatation. Pancreas: Unremarkable. No pancreatic ductal dilatation or surrounding inflammatory changes. Spleen: Normal in size without significant abnormality. Adrenals/Urinary Tract: Adrenal glands are unremarkable. Kidneys are  normal, without renal calculi, solid lesion, or hydronephrosis. Bladder is unremarkable. Stomach/Bowel: Stomach is within normal limits. Appendix appears normal. No evidence of bowel wall thickening, distention, or inflammatory changes. Vascular/Lymphatic: Aortic atherosclerosis. No enlarged abdominal or pelvic lymph nodes. Reproductive: No mass or other abnormality. Other: No abdominal wall hernia. Similar skin thickening about the lower abdominal quadrants, most likely related to injection granulomata. No ascites. Musculoskeletal: No acute osseous findings. IMPRESSION: 1. No evidence of mass, lymphadenopathy or metastatic disease in the chest, abdomen, or pelvis. 2. Clustered subsolid nodular opacities in the superior segment left lower lobe measuring up to 0.6 cm. Previously seen extensive bilateral heterogeneous and ground-glass airspace disease is resolved. Findings are most consistent with minimal residual or recurrent infection or inflammation. Attention on follow-up. 3. Coronary artery disease. Aortic Atherosclerosis (ICD10-I70.0). Electronically Signed   By: Delanna Ahmadi M.D.   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  Rectal mass biopsy consistent with well-moderately differentiated adenocarcinoma. - CEA (04/06/2022): 25.7. - CT CAP (04/27/2022): No evidence of mass, lymphadenopathy or metastatic disease in the chest, abdomen or pelvis.  Clustered subsolid nodular opacities in the superior segment left lower lobe measures up to 0.6 cm. - Rectal MRI (05/10/2022): The lesion is in the sigmoid colon measuring 4 cm with findings for small volume extension into the sigmoid mesocolon.  Intimate association with adjacent uterus without gross invasion.  Sigmoid mesocolon node, borderline size.  Right external iliac nodes are favored to be  reactive.   Social/family history: - She is seen with her husband today.  She is residing at [facility] home for the past 3 months due to falls from right foot drop and numbness in the right hand, right foot and lower leg.  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  She is a non-smoker. - Maternal grandmother had colon cancer.   PLAN:  Sigmoid colon adenocarcinoma: - I have reviewed imaging studies and pathology reports with the patient and her husband in detail. - Although initially thought to be in the rectosigmoid junction, MRI rectal protocol reveals the tumor to be in the distal sigmoid region. - Hence I have recommended patient follow-up with Dr. Marcello Moores for sigmoid colectomy. - I will see her back in 4 weeks after surgery to discuss if she needs adjuvant chemotherapy should she have lymph node involvement.   All questions were answered. The patient knows to call the clinic with any problems, questions or concerns.   Derek Jack, MD, 05/13/22 8:55 AM  [facility] [phone]   I, Thana Ates, am acting as a scribe for Dr. Derek Jack.  I, Derek Jack MD, have reviewed the above documentation for accuracy and completeness, and I agree with the above.

## 2022-05-13 NOTE — Patient Instructions (Addendum)
[facility] at [facility] Discharge Instructions  You were seen and examined today by Dr. Delton Coombes. Dr. Delton Coombes is a medical oncologist, meaning that he specializes in the treatment of cancer diagnoses. Dr. Delton Coombes discussed your past medical history, family history of cancers, and the events that led to you being here today.  You were referred to Dr. Delton Coombes by Dr. Marcello Moores due to your new diagnosis of Colon Cancer. Colon Cancer is treated with surgery followed by chemotherapy, depending on if there is any lymph node involvement.  According to the imaging on your recent MRI, the mass appears to be in the sigmoid (lower part of the colon) colon. Your recent CT scan did not show any spread of cancer to other organs. This is good news. There was a questionable lymph node involvement on the MRI, but it was not completely clear. Lymph nodes will be removed during surgery to determine there involvement.  The exact stage of your cancer will be determined after surgery based on lymph node involvement. If there is no lymph node involvement, it would be considered Stage II. If there is any lymph node involvement, it would be considered Stage III. Chemotherapy is typically given following surgery if there is any lymph node involvement.  Without treatment, the cancer would continue to spread to other organs within your body such as liver and lungs. Surgery, in your current state of disease, would be curative.  Please follow-up with Dr. Delton Coombes approximately one month after surgery. At that time, he will discuss the need for chemotherapy and ongoing follow-up based on your diagnosis.   Thank you for choosing [facility] [place] at [facility] to provide your oncology and hematology care.  To afford each patient quality time with our provider, please arrive at least 15 minutes before your scheduled appointment time.   If you have a lab appointment with the [facility] please come in thru the Main Entrance and check in at the main information desk.  You need to re-schedule your appointment should you arrive 10 or more minutes late.  We strive to give you quality time with our providers, and arriving late affects you and other patients whose appointments are after yours.  Also, if you no show three or more times for appointments you may be dismissed from the clinic at the providers discretion.     Again, thank you for choosing [facility].  Our hope is that these requests will decrease the amount of time that you wait before being seen by our physicians.       _____________________________________________________________  Should you have questions after your visit to [facility], please contact our office at [phone] and follow the prompts.  Our office hours are 8:00 a.m. and 4:30 p.m. Monday - Friday.  Please note that voicemails left after 4:00 p.m. may not be returned until the following business day.  We are closed weekends and major holidays.  You do have access to a nurse 24-7, just call the main number to the clinic [phone] and do not press any options, hold on the line and a nurse will answer the phone.    For prescription refill requests, have your pharmacy contact our office and allow 72 hours.    Due to Covid, you will need to wear a mask upon entering the hospital. If you do not have a mask, a mask will be given to you at the Main Entrance upon  arrival. For doctor visits, patients may have 1 support person age 2 or older with them. For treatment visits, patients can not have anyone with them due to social distancing guidelines and our immunocompromised population.

## 2022-05-13 NOTE — Progress Notes (Signed)
I met with the patient and her husband today during and following initial visit with Dr. Katragadda. I introduced myself and explained my role in the patient's care. I provided my contact information and encouraged the patient to call with questions or concerns. 

## 2022-06-01 ENCOUNTER — Ambulatory Visit: Payer: Self-pay | Admitting: General Surgery

## 2022-06-01 DIAGNOSIS — E1151 Type 2 diabetes mellitus with diabetic peripheral angiopathy without gangrene: Secondary | ICD-10-CM

## 2022-07-08 ENCOUNTER — Inpatient Hospital Stay: Payer: Medicare HMO | Admitting: Hematology

## 2022-08-02 NOTE — Progress Notes (Signed)
COVID Vaccine Completed:  Date of COVID positive in last 90 days:  PCP - Lars Mage, NP Cardiologist - Minus Breeding, MD (last OV 2021) Pulmonologist - Kara Mead, MD (last OV 2021)  Chest x-ray - 12-15-21 Epic EKG - 12-15-21 Epic Stress Test - 2017 Epic ECHO - 2016 Epic Cardiac Cath -  Pacemaker/ICD device last checked: Spinal Cord Stimulator:  Bowel Prep -   Sleep Study -  Yes, +sleep apnea CPAP -   Fasting Blood Sugar -  Checks Blood Sugar _____ times a day  Blood Thinner Instructions: Aspirin Instructions: Last Dose:  Activity level:  Can go up a flight of stairs and perform activities of daily living without stopping and without symptoms of chest pain or shortness of breath.  Able to exercise without symptoms  Unable to go up a flight of stairs without symptoms of     Anesthesia review:  CHF, COPD, diastolic dysfuction, HTN, OSA, DM  Patient denies shortness of breath, fever, cough and chest pain at PAT appointment  Patient verbalized understanding of instructions that were given to them at the PAT appointment. Patient was also instructed that they will need to review over the PAT instructions again at home before surgery.

## 2022-08-02 NOTE — Patient Instructions (Signed)
SURGICAL WAITING ROOM VISITATION Patients having surgery or a procedure may have no more than 2 support people in the waiting area - these visitors may rotate.   Children under the age of 68 must have an adult with them who is not the patient. If the patient needs to stay at the hospital during part of their recovery, the visitor guidelines for inpatient rooms apply. Pre-op nurse will coordinate an appropriate time for 1 support person to accompany patient in pre-op.  This support person may not rotate.    Please refer to the [facility] website for the visitor guidelines for Inpatients (after your surgery is over and you are in a regular room).      Your procedure is scheduled on: 08-11-22   Report to [facility] Main Entrance    Report to admitting at 6:15 AM   Call this number if you have problems the morning of surgery [phone]   Follow a clear liquid diet the day before surgery to prevent dehydration.   After Midnight you may have the following liquids until 5:30 AM DAY OF SURGERY  Water Non-Citrus Juices (without pulp, NO RED) Carbonated Beverages Black Coffee (NO MILK/CREAM OR CREAMERS, sugar ok)  Clear Tea (NO MILK/CREAM OR CREAMERS, sugar ok) regular and decaf                             Plain Jell-O (NO RED)                                           Fruit ices (not with fruit pulp, NO RED)                                     Popsicles (NO RED)                                                               Sports drinks like Gatorade (NO RED)  Drink 2 Pre-Surgery G2 drinks the evening before surgery (complete by 10 PM)                   The day of surgery:  Drink ONE (1) Pre-Surgery G2 at 5:30 AM the morning of surgery. Drink in one sitting. Do not sip.  This drink was given to you during your hospital  pre-op appointment visit. Nothing else to drink after completing the Pre-Surgery G2.          If you have questions, please contact your surgeon's  office.   FOLLOW BOWEL PREP AND ANY ADDITIONAL PRE OP INSTRUCTIONS YOU RECEIVED FROM YOUR SURGEON'S OFFICE!!!     Oral Hygiene is also important to reduce your risk of infection.                                    Remember - BRUSH YOUR TEETH THE MORNING OF SURGERY WITH YOUR REGULAR TOOTHPASTE   Do NOT smoke after Midnight  Take these medicines the morning  of surgery with A SIP OF WATER:  Amlodipine, Gabapentin, Hydralazine, Hydroxyzine, Levothyroxine, Claritin, Omeprazole, Propranolol, Sertraline. Zofran if needed  How to Manage Your Diabetes Before and After Surgery  Why is it important to control my blood sugar before and after surgery? Improving blood sugar levels before and after surgery helps healing and can limit problems. A way of improving blood sugar control is eating a healthy diet by:  Eating less sugar and carbohydrates  Increasing activity/exercise  Talking with your doctor about reaching your blood sugar goals High blood sugars (greater than 180 mg/dL) can raise your risk of infections and slow your recovery, so you will need to focus on controlling your diabetes during the weeks before surgery. Make sure that the doctor who takes care of your diabetes knows about your planned surgery including the date and location.  How do I manage my blood sugar before surgery? Check your blood sugar at least 4 times a day, starting 2 days before surgery, to make sure that the level is not too high or low. Check your blood sugar the morning of your surgery when you wake up and every 2 hours until you get to the Short Stay unit. If your blood sugar is less than 70 mg/dL, you will need to treat for low blood sugar: Do not take insulin. Treat a low blood sugar (less than 70 mg/dL) with  cup of clear juice (cranberry or apple), 4 glucose tablets, OR glucose gel. Recheck blood sugar in 15 minutes after treatment (to make sure it is greater than 70 mg/dL). If your blood sugar is not greater  than 70 mg/dL on recheck, call [phone] for further instructions. Report your blood sugar to the short stay nurse when you get to Short Stay.  If you are admitted to the hospital after surgery: Your blood sugar will be checked by the staff and you will probably be given insulin after surgery (instead of oral diabetes medicines) to make sure you have good blood sugar levels. The goal for blood sugar control after surgery is 80-180 mg/dL.   WHAT DO I DO ABOUT MY DIABETES MEDICATION?  Do not take oral diabetes medicines (pills) the morning of surgery.  THE NIGHT BEFORE SURGERY:  Insulin Glargine, take     units of       insulin.       THE MORNING OF SURGERY:  Insuline Glargine, take   units of         insulin.  Reviewed and Endorsed by [facility] Patient Education Committee, August 2015   Bring CPAP mask and tubing day of surgery.                              You may not have any metal on your body including hair pins, jewelry, and body piercing             Do not wear make-up, lotions, powders, perfumes or deodorant  Do not wear nail polish including gel and S&S, artificial/acrylic nails, or any other type of covering on natural nails including finger and toenails. If you have artificial nails, gel coating, etc. that needs to be removed by a nail salon please have this removed prior to surgery or surgery may need to be canceled/ delayed if the surgeon/ anesthesia feels like they are unable to be safely monitored.   Do not shave  48 hours prior to surgery.    Do not  bring valuables to the hospital. [place].   Contacts, dentures or bridgework may not be worn into surgery.   Bring small overnight bag day of surgery.   DO NOT [place]. PHARMACY WILL DISPENSE MEDICATIONS LISTED ON YOUR MEDICATION LIST TO YOU DURING YOUR ADMISSION [place]!    Special Instructions: Bring a copy of your healthcare power  of attorney and living will documents the day of surgery if you haven't scanned them before.  Please read over the following fact sheets you were given: IF YOU HAVE QUESTIONS ABOUT YOUR PRE-OP INSTRUCTIONS PLEASE CALL [place] - Preparing for Surgery Before surgery, you can play an important role.  Because skin is not sterile, your skin needs to be as free of germs as possible.  You can reduce the number of germs on your skin by washing with CHG (chlorahexidine gluconate) soap before surgery.  CHG is an antiseptic cleaner which kills germs and bonds with the skin to continue killing germs even after washing. Please DO NOT use if you have an allergy to CHG or antibacterial soaps.  If your skin becomes reddened/irritated stop using the CHG and inform your nurse when you arrive at Short Stay. Do not shave (including legs and underarms) for at least 48 hours prior to the first CHG shower.  You may shave your face/neck.  Please follow these instructions carefully:  1.  Shower with CHG Soap the night before surgery and the  morning of surgery.  2.  If you choose to wash your hair, wash your hair first as usual with your normal  shampoo.  3.  After you shampoo, rinse your hair and body thoroughly to remove the shampoo.                             4.  Use CHG as you would any other liquid soap.  You can apply chg directly to the skin and wash.  Gently with a scrungie or clean washcloth.  5.  Apply the CHG Soap to your body ONLY FROM THE NECK DOWN.   Do   not use on face/ open                           Wound or open sores. Avoid contact with eyes, ears mouth and   genitals (private parts).                       Wash face,  Genitals (private parts) with your normal soap.             6.  Wash thoroughly, paying special attention to the area where your    surgery  will be performed.  7.  Thoroughly rinse your body with warm water from the neck down.  8.  DO NOT shower/wash with your  normal soap after using and rinsing off the CHG Soap.                9.  Pat yourself dry with a clean towel.            10.  Wear clean pajamas.            11.  Place clean sheets on your bed the night of your first shower and do not  sleep with pets.  Day of Surgery : Do not apply any lotions/deodorants the morning of surgery.  Please wear clean clothes to the hospital/surgery center.  FAILURE TO FOLLOW THESE INSTRUCTIONS MAY RESULT IN THE CANCELLATION OF YOUR SURGERY  PATIENT SIGNATURE_________________________________  NURSE SIGNATURE__________________________________  ________________________________________________________________________     [name]  An incentive spirometer is a tool that can help keep your lungs clear and active. This tool measures how well you are filling your lungs with each breath. Taking long deep breaths may help reverse or decrease the chance of developing breathing (pulmonary) problems (especially infection) following: A long period of time when you are unable to move or be active. BEFORE THE PROCEDURE  If the spirometer includes an indicator to show your best effort, your nurse or respiratory therapist will set it to a desired goal. If possible, sit up straight or lean slightly forward. Try not to slouch. Hold the incentive spirometer in an upright position. INSTRUCTIONS FOR USE  Sit on the edge of your bed if possible, or sit up as far as you can in bed or on a chair. Hold the incentive spirometer in an upright position. Breathe out normally. Place the mouthpiece in your mouth and seal your lips tightly around it. Breathe in slowly and as deeply as possible, raising the piston or the ball toward the top of the column. Hold your breath for 3-5 seconds or for as long as possible. Allow the piston or ball to fall to the bottom of the column. Remove the mouthpiece from your mouth and breathe out normally. Rest for a few seconds and repeat Steps  1 through 7 at least 10 times every 1-2 hours when you are awake. Take your time and take a few normal breaths between deep breaths. The spirometer may include an indicator to show your best effort. Use the indicator as a goal to work toward during each repetition. After each set of 10 deep breaths, practice coughing to be sure your lungs are clear. If you have an incision (the cut made at the time of surgery), support your incision when coughing by placing a pillow or rolled up towels firmly against it. Once you are able to get out of bed, walk around indoors and cough well. You may stop using the incentive spirometer when instructed by your caregiver.  RISKS AND COMPLICATIONS Take your time so you do not get dizzy or light-headed. If you are in pain, you may need to take or ask for pain medication before doing incentive spirometry. It is harder to take a deep breath if you are having pain. AFTER USE Rest and breathe slowly and easily. It can be helpful to keep track of a log of your progress. Your caregiver can provide you with a simple table to help with this. If you are using the spirometer at home, follow these instructions: [place] IF:  You are having difficultly using the spirometer. You have trouble using the spirometer as often as instructed. Your pain medication is not giving enough relief while using the spirometer. You develop fever of 100.5 F (38.1 C) or higher. SEEK IMMEDIATE MEDICAL CARE IF:  You cough up bloody sputum that had not been present before. You develop fever of 102 F (38.9 C) or greater. You develop worsening pain at or near the incision site. MAKE SURE YOU:  Understand these instructions. Will watch your condition. Will get help right away if you are not doing well or get worse. Document Released: 04/04/2007 Document Revised: 02/14/2012  Document Reviewed: 06/05/2007 [facility] Patient Information 2014 [employer],  [place].   ________________________________________________________________________  WHAT IS A BLOOD TRANSFUSION? Blood Transfusion Information  A transfusion is the replacement of blood or some of its parts. Blood is made up of multiple cells which provide different functions. Red blood cells carry oxygen and are used for blood loss replacement. White blood cells fight against infection. Platelets control bleeding. Plasma helps clot blood. Other blood products are available for specialized needs, such as hemophilia or other clotting disorders. BEFORE THE TRANSFUSION  Who gives blood for transfusions?  Healthy volunteers who are fully evaluated to make sure their blood is safe. This is blood bank blood. Transfusion therapy is the safest it has ever been in the practice of medicine. Before blood is taken from a donor, a complete history is taken to make sure that person has no history of diseases nor engages in risky social behavior (examples are intravenous drug use or sexual activity with multiple partners). The donor's travel history is screened to minimize risk of transmitting infections, such as malaria. The donated blood is tested for signs of infectious diseases, such as HIV and hepatitis. The blood is then tested to be sure it is compatible with you in order to minimize the chance of a transfusion reaction. If you or a relative donates blood, this is often done in anticipation of surgery and is not appropriate for emergency situations. It takes many days to process the donated blood. RISKS AND COMPLICATIONS Although transfusion therapy is very safe and saves many lives, the main dangers of transfusion include:  Getting an infectious disease. Developing a transfusion reaction. This is an allergic reaction to something in the blood you were given. Every precaution is taken to prevent this. The decision to have a blood transfusion has been considered carefully by your caregiver before blood is  given. Blood is not given unless the benefits outweigh the risks. AFTER THE TRANSFUSION Right after receiving a blood transfusion, you will usually feel much better and more energetic. This is especially true if your red blood cells have gotten low (anemic). The transfusion raises the level of the red blood cells which carry oxygen, and this usually causes an energy increase. The nurse administering the transfusion will monitor you carefully for complications. HOME CARE INSTRUCTIONS  No special instructions are needed after a transfusion. You may find your energy is better. Speak with your caregiver about any limitations on activity for underlying diseases you may have. SEEK MEDICAL CARE IF:  Your condition is not improving after your transfusion. You develop redness or irritation at the intravenous (IV) site. SEEK IMMEDIATE MEDICAL CARE IF:  Any of the following symptoms occur over the next 12 hours: Shaking chills. You have a temperature by mouth above 102 F (38.9 C), not controlled by medicine. Chest, back, or muscle pain. People around you feel you are not acting correctly or are confused. Shortness of breath or difficulty breathing. Dizziness and fainting. You get a rash or develop hives. You have a decrease in urine output. Your urine turns a dark color or changes to pink, red, or brown. Any of the following symptoms occur over the next 10 days: You have a temperature by mouth above 102 F (38.9 C), not controlled by medicine. Shortness of breath. Weakness after normal activity. The white part of the eye turns yellow (jaundice). You have a decrease in the amount of urine or are urinating less often. Your urine turns a dark color or changes to pink,  red, or brown. Document Released: 11/19/2000 Document Revised: 02/14/2012 Document Reviewed: 07/08/2008 [facility] Patient Information 2014 [place], [place].  _______________________________________________________________________

## 2022-08-03 ENCOUNTER — Encounter (HOSPITAL_COMMUNITY): Payer: Self-pay

## 2022-08-03 ENCOUNTER — Other Ambulatory Visit: Payer: Self-pay

## 2022-08-03 ENCOUNTER — Encounter (HOSPITAL_COMMUNITY)
Admission: RE | Admit: 2022-08-03 | Discharge: 2022-08-03 | Disposition: A | Discharge status: Home / Self Care | Payer: Medicare HMO | Source: Ambulatory Visit | Attending: General Surgery | Admitting: General Surgery

## 2022-08-03 VITALS — BP 160/56 | HR 59 | Temp 98.4°F | Resp 18 | Ht 61.0 in | Wt 258.0 lb

## 2022-08-03 DIAGNOSIS — Z01812 Encounter for preprocedural laboratory examination: Secondary | ICD-10-CM | POA: Insufficient documentation

## 2022-08-03 DIAGNOSIS — E1151 Type 2 diabetes mellitus with diabetic peripheral angiopathy without gangrene: Secondary | ICD-10-CM | POA: Diagnosis not present

## 2022-08-03 DIAGNOSIS — Z794 Long term (current) use of insulin: Secondary | ICD-10-CM | POA: Diagnosis not present

## 2022-08-03 DIAGNOSIS — I2510 Atherosclerotic heart disease of native coronary artery without angina pectoris: Secondary | ICD-10-CM | POA: Diagnosis not present

## 2022-08-03 DIAGNOSIS — E119 Type 2 diabetes mellitus without complications: Secondary | ICD-10-CM

## 2022-08-03 DIAGNOSIS — D649 Anemia, unspecified: Secondary | ICD-10-CM | POA: Diagnosis not present

## 2022-08-03 DIAGNOSIS — Z01818 Encounter for other preprocedural examination: Secondary | ICD-10-CM

## 2022-08-03 HISTORY — DX: Anemia, unspecified: D64.9

## 2022-08-03 HISTORY — DX: Malignant neoplasm of sigmoid colon: C18.7

## 2022-08-03 HISTORY — DX: Pneumonia, unspecified organism: J18.9

## 2022-08-03 HISTORY — DX: Unspecified osteoarthritis, unspecified site: M19.90

## 2022-08-03 HISTORY — DX: Dyspnea, unspecified: R06.00

## 2022-08-03 LAB — BASIC METABOLIC PANEL
Anion gap: 6 (ref 5–15)
BUN: 23 mg/dL (ref 8–23)
CO2: 27 mmol/L (ref 22–32)
Calcium: 9.6 mg/dL (ref 8.9–10.3)
Chloride: 107 mmol/L (ref 98–111)
Creatinine, Ser: 1.24 mg/dL — ABNORMAL HIGH (ref 0.44–1.00)
GFR, Estimated: 50 mL/min — ABNORMAL LOW (ref >60)
Glucose, Bld: 131 mg/dL — ABNORMAL HIGH (ref 70–99)
Potassium: 3.8 mmol/L (ref 3.5–5.1)
Sodium: 140 mmol/L (ref 135–145)

## 2022-08-03 LAB — CBC
HCT: 38.2 % (ref 36.0–46.0)
Hemoglobin: 11.3 g/dL — ABNORMAL LOW (ref 12.0–15.0)
MCH: 21.7 pg — ABNORMAL LOW (ref 26.0–34.0)
MCHC: 29.6 g/dL — ABNORMAL LOW (ref 30.0–36.0)
MCV: 73.3 fL — ABNORMAL LOW (ref 80.0–100.0)
Platelets: 397 10*3/uL (ref 150–400)
RBC: 5.21 MIL/uL — ABNORMAL HIGH (ref 3.87–5.11)
RDW: 24.7 % — ABNORMAL HIGH (ref 11.5–15.5)
WBC: 9.3 10*3/uL (ref 4.0–10.5)
nRBC: 0 % (ref 0.0–0.2)

## 2022-08-03 LAB — GLUCOSE, CAPILLARY: Glucose-Capillary: 134 mg/dL — ABNORMAL HIGH (ref 70–99)

## 2022-08-03 LAB — HEMOGLOBIN A1C
Hgb A1c MFr Bld: 7.2 % — ABNORMAL HIGH (ref 4.8–5.6)
Mean Plasma Glucose: 159.94 mg/dL

## 2022-08-03 NOTE — Progress Notes (Signed)
Spoke to patient's nurse Caryl Pina to confirm that she received instructions.  She stated that they do not have bowel prep instructions for patient.  They were given the phone number to Dr. Marcello Moores' office and they will call to get instructions.  Confirmed arrival time of 16 and [place] will be transporting patient day of surgery.

## 2022-08-10 NOTE — Anesthesia Preprocedure Evaluation (Signed)
Anesthesia Evaluation  Patient identified by MRN, date of birth, ID band Patient awake    Reviewed: Allergy & Precautions, NPO status , Patient's Chart, lab work & pertinent test results  History of Anesthesia Complications (+) PONV and history of anesthetic complications  Airway Mallampati: I  TM Distance: >3 FB Neck ROM: Full    Dental  (+) Dental Advisory Given, Edentulous Upper   Pulmonary asthma , sleep apnea , COPD,    Pulmonary exam normal breath sounds clear to auscultation       Cardiovascular hypertension, Pt. on medications and Pt. on home beta blockers (-) angina+CHF  (-) Past MI Normal cardiovascular exam Rhythm:Regular Rate:Normal     Neuro/Psych PSYCHIATRIC DISORDERS Depression negative neurological ROS     GI/Hepatic Neg liver ROS, GERD  Medicated,COLON CANCER   Endo/Other  diabetes, Type 2, Insulin DependentHypothyroidism Morbid obesity  Renal/GU negative Renal ROS     Musculoskeletal  (+) Arthritis ,   Abdominal   Peds  Hematology  (+) Blood dyscrasia, anemia ,   Anesthesia Other Findings   Reproductive/Obstetrics                            Anesthesia Physical Anesthesia Plan  ASA: 4  Anesthesia Plan: General   Post-op Pain Management: Tylenol PO (pre-op)* and Toradol IV (intra-op)*   Induction: Intravenous  PONV Risk Score and Plan: 4 or greater and Midazolam, Propofol infusion, Dexamethasone, Ondansetron and Diphenhydramine  Airway Management Planned: Oral ETT  Additional Equipment: Arterial line  Intra-op Plan:   Post-operative Plan: Extubation in OR  Informed Consent: I have reviewed the patients History and Physical, chart, labs and discussed the procedure including the risks, benefits and alternatives for the proposed anesthesia with the patient or authorized representative who has indicated his/her understanding and acceptance.     Dental  advisory given  Plan Discussed with: CRNA  Anesthesia Plan Comments: (2nd large bore PIV)      Anesthesia Quick Evaluation

## 2022-08-11 ENCOUNTER — Encounter (HOSPITAL_COMMUNITY): Payer: Self-pay | Admitting: General Surgery

## 2022-08-11 ENCOUNTER — Inpatient Hospital Stay (HOSPITAL_COMMUNITY): Payer: Medicare HMO | Admitting: Anesthesiology

## 2022-08-11 ENCOUNTER — Inpatient Hospital Stay (HOSPITAL_COMMUNITY): Payer: Medicare HMO | Admitting: Physician Assistant

## 2022-08-11 ENCOUNTER — Other Ambulatory Visit: Payer: Self-pay

## 2022-08-11 ENCOUNTER — Encounter (HOSPITAL_COMMUNITY)
Admission: RE | Disposition: A | Discharge status: Skilled Nursing Facility | Payer: Self-pay | Source: Home / Self Care | Attending: General Surgery

## 2022-08-11 ENCOUNTER — Inpatient Hospital Stay (HOSPITAL_COMMUNITY)
Admission: RE | Admit: 2022-08-11 | Discharge: 2022-08-14 | DRG: 330 | Disposition: A | Discharge status: Skilled Nursing Facility | Payer: Medicare HMO | Attending: Surgery | Admitting: Surgery

## 2022-08-11 DIAGNOSIS — R7989 Other specified abnormal findings of blood chemistry: Secondary | ICD-10-CM | POA: Diagnosis present

## 2022-08-11 DIAGNOSIS — I5032 Chronic diastolic (congestive) heart failure: Secondary | ICD-10-CM | POA: Diagnosis present

## 2022-08-11 DIAGNOSIS — I509 Heart failure, unspecified: Secondary | ICD-10-CM

## 2022-08-11 DIAGNOSIS — Z823 Family history of stroke: Secondary | ICD-10-CM

## 2022-08-11 DIAGNOSIS — K219 Gastro-esophageal reflux disease without esophagitis: Secondary | ICD-10-CM | POA: Diagnosis present

## 2022-08-11 DIAGNOSIS — E039 Hypothyroidism, unspecified: Secondary | ICD-10-CM | POA: Diagnosis present

## 2022-08-11 DIAGNOSIS — I10 Essential (primary) hypertension: Secondary | ICD-10-CM | POA: Diagnosis not present

## 2022-08-11 DIAGNOSIS — I130 Hypertensive heart and chronic kidney disease with heart failure and stage 1 through stage 4 chronic kidney disease, or unspecified chronic kidney disease: Secondary | ICD-10-CM | POA: Diagnosis present

## 2022-08-11 DIAGNOSIS — D638 Anemia in other chronic diseases classified elsewhere: Secondary | ICD-10-CM

## 2022-08-11 DIAGNOSIS — C187 Malignant neoplasm of sigmoid colon: Secondary | ICD-10-CM | POA: Diagnosis present

## 2022-08-11 DIAGNOSIS — C189 Malignant neoplasm of colon, unspecified: Secondary | ICD-10-CM | POA: Diagnosis present

## 2022-08-11 DIAGNOSIS — Z800 Family history of malignant neoplasm of digestive organs: Secondary | ICD-10-CM | POA: Diagnosis not present

## 2022-08-11 DIAGNOSIS — D630 Anemia in neoplastic disease: Secondary | ICD-10-CM

## 2022-08-11 DIAGNOSIS — G4733 Obstructive sleep apnea (adult) (pediatric): Secondary | ICD-10-CM | POA: Diagnosis present

## 2022-08-11 DIAGNOSIS — J449 Chronic obstructive pulmonary disease, unspecified: Secondary | ICD-10-CM | POA: Diagnosis present

## 2022-08-11 DIAGNOSIS — I110 Hypertensive heart disease with heart failure: Secondary | ICD-10-CM

## 2022-08-11 DIAGNOSIS — E119 Type 2 diabetes mellitus without complications: Secondary | ICD-10-CM

## 2022-08-11 DIAGNOSIS — Z888 Allergy status to other drugs, medicaments and biological substances status: Secondary | ICD-10-CM

## 2022-08-11 DIAGNOSIS — Z79899 Other long term (current) drug therapy: Secondary | ICD-10-CM | POA: Diagnosis not present

## 2022-08-11 DIAGNOSIS — Z794 Long term (current) use of insulin: Secondary | ICD-10-CM

## 2022-08-11 DIAGNOSIS — R9431 Abnormal electrocardiogram [ECG] [EKG]: Secondary | ICD-10-CM | POA: Diagnosis present

## 2022-08-11 DIAGNOSIS — E1122 Type 2 diabetes mellitus with diabetic chronic kidney disease: Secondary | ICD-10-CM | POA: Diagnosis present

## 2022-08-11 DIAGNOSIS — Z885 Allergy status to narcotic agent status: Secondary | ICD-10-CM | POA: Diagnosis not present

## 2022-08-11 DIAGNOSIS — E1165 Type 2 diabetes mellitus with hyperglycemia: Secondary | ICD-10-CM | POA: Diagnosis present

## 2022-08-11 DIAGNOSIS — Z881 Allergy status to other antibiotic agents status: Secondary | ICD-10-CM | POA: Diagnosis not present

## 2022-08-11 DIAGNOSIS — Z6841 Body Mass Index (BMI) 40.0 and over, adult: Secondary | ICD-10-CM | POA: Diagnosis not present

## 2022-08-11 DIAGNOSIS — R160 Hepatomegaly, not elsewhere classified: Secondary | ICD-10-CM | POA: Diagnosis present

## 2022-08-11 DIAGNOSIS — F32A Depression, unspecified: Secondary | ICD-10-CM | POA: Diagnosis present

## 2022-08-11 DIAGNOSIS — Z8249 Family history of ischemic heart disease and other diseases of the circulatory system: Secondary | ICD-10-CM

## 2022-08-11 DIAGNOSIS — Z9049 Acquired absence of other specified parts of digestive tract: Secondary | ICD-10-CM | POA: Diagnosis not present

## 2022-08-11 DIAGNOSIS — D509 Iron deficiency anemia, unspecified: Secondary | ICD-10-CM | POA: Diagnosis present

## 2022-08-11 DIAGNOSIS — N1831 Chronic kidney disease, stage 3a: Secondary | ICD-10-CM | POA: Diagnosis present

## 2022-08-11 DIAGNOSIS — Z780 Asymptomatic menopausal state: Secondary | ICD-10-CM

## 2022-08-11 DIAGNOSIS — I5033 Acute on chronic diastolic (congestive) heart failure: Secondary | ICD-10-CM | POA: Diagnosis present

## 2022-08-11 DIAGNOSIS — Z79890 Hormone replacement therapy (postmenopausal): Secondary | ICD-10-CM

## 2022-08-11 DIAGNOSIS — Z7401 Bed confinement status: Secondary | ICD-10-CM

## 2022-08-11 DIAGNOSIS — Z8673 Personal history of transient ischemic attack (TIA), and cerebral infarction without residual deficits: Secondary | ICD-10-CM

## 2022-08-11 DIAGNOSIS — E782 Mixed hyperlipidemia: Secondary | ICD-10-CM | POA: Diagnosis present

## 2022-08-11 DIAGNOSIS — Z833 Family history of diabetes mellitus: Secondary | ICD-10-CM

## 2022-08-11 LAB — TYPE AND SCREEN
ABO/RH(D): O POS
Antibody Screen: NEGATIVE

## 2022-08-11 LAB — GLUCOSE, CAPILLARY
Glucose-Capillary: 189 mg/dL — ABNORMAL HIGH (ref 70–99)
Glucose-Capillary: 198 mg/dL — ABNORMAL HIGH (ref 70–99)
Glucose-Capillary: 164 mg/dL — ABNORMAL HIGH (ref 70–99)
Glucose-Capillary: 203 mg/dL — ABNORMAL HIGH (ref 70–99)
Glucose-Capillary: 261 mg/dL — ABNORMAL HIGH (ref 70–99)
Glucose-Capillary: 291 mg/dL — ABNORMAL HIGH (ref 70–99)

## 2022-08-11 LAB — ABO/RH: ABO/RH(D): O POS

## 2022-08-11 SURGERY — COLECTOMY, PARTIAL, ROBOT-ASSISTED, LAPAROSCOPIC
Anesthesia: General | Site: Abdomen

## 2022-08-11 MED ORDER — SODIUM CHLORIDE 0.9 % IV SOLN
2.0000 g | INTRAVENOUS | Status: AC
  Administered 2022-08-11: 2 g via INTRAVENOUS
  Filled 2022-08-11: qty 2

## 2022-08-11 MED ORDER — ALVIMOPAN 12 MG PO CAPS
12.0000 mg | ORAL_CAPSULE | Freq: Two times a day (BID) | ORAL | Status: DC
  Administered 2022-08-12: 12 mg via ORAL
  Filled 2022-08-11: qty 1

## 2022-08-11 MED ORDER — PANTOPRAZOLE SODIUM 40 MG PO TBEC
40.0000 mg | DELAYED_RELEASE_TABLET | Freq: Every day | ORAL | Status: DC
  Administered 2022-08-11 – 2022-08-14 (×4): 40 mg via ORAL
  Filled 2022-08-11 (×4): qty 1

## 2022-08-11 MED ORDER — ORAL CARE MOUTH RINSE: 15.0000 mL | Freq: Once | OROMUCOSAL | Status: AC

## 2022-08-11 MED ORDER — PROCHLORPERAZINE EDISYLATE 10 MG/2ML IJ SOLN: 5.0000 mg | Freq: Four times a day (QID) | INTRAMUSCULAR | Status: DC | PRN

## 2022-08-11 MED ORDER — LEVOTHYROXINE SODIUM 88 MCG PO TABS
88.0000 ug | ORAL_TABLET | Freq: Every day | ORAL | Status: DC
  Administered 2022-08-12 – 2022-08-14 (×3): 88 ug via ORAL
  Filled 2022-08-11 (×3): qty 1

## 2022-08-11 MED ORDER — PHENYLEPHRINE HCL (PRESSORS) 10 MG/ML IV SOLN
INTRAVENOUS | Status: DC | PRN
  Administered 2022-08-11: 160 ug via INTRAVENOUS

## 2022-08-11 MED ORDER — LIDOCAINE HCL (PF) 2 % IJ SOLN
INTRAMUSCULAR | Status: AC
  Filled 2022-08-11: qty 15

## 2022-08-11 MED ORDER — PROPOFOL 10 MG/ML IV BOLUS
INTRAVENOUS | Status: DC | PRN
  Administered 2022-08-11: 130 mg via INTRAVENOUS

## 2022-08-11 MED ORDER — LIDOCAINE HCL (CARDIAC) PF 100 MG/5ML IV SOSY
PREFILLED_SYRINGE | INTRAVENOUS | Status: DC | PRN
  Administered 2022-08-11: 100 mg via INTRAVENOUS

## 2022-08-11 MED ORDER — AMLODIPINE BESYLATE 10 MG PO TABS
10.0000 mg | ORAL_TABLET | Freq: Every day | ORAL | Status: DC
  Administered 2022-08-11 – 2022-08-14 (×4): 10 mg via ORAL
  Filled 2022-08-11 (×4): qty 1

## 2022-08-11 MED ORDER — PROMETHAZINE HCL 25 MG/ML IJ SOLN: 6.2500 mg | INTRAMUSCULAR | Status: DC | PRN

## 2022-08-11 MED ORDER — PROPOFOL 500 MG/50ML IV EMUL
INTRAVENOUS | Status: DC | PRN
  Administered 2022-08-11: 50 ug/kg/min via INTRAVENOUS

## 2022-08-11 MED ORDER — BUPIVACAINE-EPINEPHRINE (PF) 0.25% -1:200000 IJ SOLN
INTRAMUSCULAR | Status: AC
  Filled 2022-08-11: qty 30

## 2022-08-11 MED ORDER — PROPRANOLOL HCL 10 MG PO TABS
80.0000 mg | ORAL_TABLET | Freq: Every day | ORAL | Status: DC
  Administered 2022-08-11 – 2022-08-14 (×4): 80 mg via ORAL
  Filled 2022-08-11 (×3): qty 8

## 2022-08-11 MED ORDER — LIDOCAINE HCL (PF) 2 % IJ SOLN
INTRAMUSCULAR | Status: DC | PRN
  Administered 2022-08-11: 1.5 mg/kg/h via INTRADERMAL

## 2022-08-11 MED ORDER — DIPHENHYDRAMINE HCL 50 MG/ML IJ SOLN
INTRAMUSCULAR | Status: DC | PRN
  Administered 2022-08-11: 12.5 mg via INTRAVENOUS

## 2022-08-11 MED ORDER — ENSURE PRE-SURGERY PO LIQD: 592.0000 mL | Freq: Once | ORAL | Status: DC

## 2022-08-11 MED ORDER — BUPIVACAINE LIPOSOME 1.3 % IJ SUSP: 20.0000 mL | Freq: Once | INTRAMUSCULAR | Status: DC

## 2022-08-11 MED ORDER — POTASSIUM CHLORIDE CRYS ER 20 MEQ PO TBCR
20.0000 meq | EXTENDED_RELEASE_TABLET | Freq: Every day | ORAL | Status: DC
  Administered 2022-08-11 – 2022-08-13 (×3): 20 meq via ORAL
  Filled 2022-08-11 (×3): qty 1

## 2022-08-11 MED ORDER — SERTRALINE HCL 50 MG PO TABS
150.0000 mg | ORAL_TABLET | Freq: Every day | ORAL | Status: DC
  Administered 2022-08-11 – 2022-08-14 (×4): 150 mg via ORAL
  Filled 2022-08-11 (×4): qty 1

## 2022-08-11 MED ORDER — PHENYLEPHRINE 80 MCG/ML (10ML) SYRINGE FOR IV PUSH (FOR BLOOD PRESSURE SUPPORT)
PREFILLED_SYRINGE | INTRAVENOUS | Status: AC
  Filled 2022-08-11: qty 10

## 2022-08-11 MED ORDER — ALVIMOPAN 12 MG PO CAPS
12.0000 mg | ORAL_CAPSULE | ORAL | Status: AC
  Administered 2022-08-11: 12 mg via ORAL
  Filled 2022-08-11: qty 1

## 2022-08-11 MED ORDER — LACTATED RINGERS IV SOLN: INTRAVENOUS | Status: DC | PRN

## 2022-08-11 MED ORDER — ENSURE SURGERY PO LIQD
237.0000 mL | Freq: Two times a day (BID) | ORAL | Status: DC
  Administered 2022-08-11: 237 mL via ORAL

## 2022-08-11 MED ORDER — HYDRALAZINE HCL 25 MG PO TABS
25.0000 mg | ORAL_TABLET | Freq: Three times a day (TID) | ORAL | Status: DC
  Administered 2022-08-11 – 2022-08-14 (×9): 25 mg via ORAL
  Filled 2022-08-11 (×9): qty 1

## 2022-08-11 MED ORDER — PHENYLEPHRINE HCL (PRESSORS) 10 MG/ML IV SOLN
INTRAVENOUS | Status: AC
  Filled 2022-08-11: qty 1

## 2022-08-11 MED ORDER — INSULIN ASPART 100 UNIT/ML IJ SOLN
INTRAMUSCULAR | Status: AC
  Filled 2022-08-11: qty 1

## 2022-08-11 MED ORDER — ENOXAPARIN SODIUM 40 MG/0.4ML IJ SOSY
40.0000 mg | PREFILLED_SYRINGE | INTRAMUSCULAR | Status: DC
  Administered 2022-08-12 – 2022-08-14 (×3): 40 mg via SUBCUTANEOUS
  Filled 2022-08-11 (×3): qty 0.4

## 2022-08-11 MED ORDER — DEXAMETHASONE SODIUM PHOSPHATE 10 MG/ML IJ SOLN
INTRAMUSCULAR | Status: AC
Start: 2022-08-11 — End: ?
  Filled 2022-08-11: qty 1

## 2022-08-11 MED ORDER — GABAPENTIN 100 MG PO CAPS
100.0000 mg | ORAL_CAPSULE | Freq: Three times a day (TID) | ORAL | Status: DC
  Administered 2022-08-11 – 2022-08-14 (×9): 100 mg via ORAL
  Filled 2022-08-11 (×9): qty 1

## 2022-08-11 MED ORDER — ROCURONIUM BROMIDE 100 MG/10ML IV SOLN
INTRAVENOUS | Status: DC | PRN
  Administered 2022-08-11: 20 mg via INTRAVENOUS
  Administered 2022-08-11: 70 mg via INTRAVENOUS

## 2022-08-11 MED ORDER — DIPHENHYDRAMINE HCL 50 MG/ML IJ SOLN
INTRAMUSCULAR | Status: AC
  Filled 2022-08-11: qty 1

## 2022-08-11 MED ORDER — MIDAZOLAM HCL 2 MG/2ML IJ SOLN
INTRAMUSCULAR | Status: AC
  Filled 2022-08-11: qty 2

## 2022-08-11 MED ORDER — OXYCODONE HCL 5 MG PO TABS: 5.0000 mg | ORAL_TABLET | ORAL | Status: DC | PRN

## 2022-08-11 MED ORDER — KETAMINE HCL 10 MG/ML IJ SOLN
INTRAMUSCULAR | Status: DC | PRN
  Administered 2022-08-11: 10 mg via INTRAVENOUS
  Administered 2022-08-11: 30 mg via INTRAVENOUS

## 2022-08-11 MED ORDER — ALUM & MAG HYDROXIDE-SIMETH 200-200-20 MG/5ML PO SUSP: 30.0000 mL | ORAL | Status: DC | PRN

## 2022-08-11 MED ORDER — PROCHLORPERAZINE MALEATE 10 MG PO TABS: 10.0000 mg | ORAL_TABLET | Freq: Four times a day (QID) | ORAL | Status: DC | PRN

## 2022-08-11 MED ORDER — ONDANSETRON HCL 4 MG/2ML IJ SOLN
INTRAMUSCULAR | Status: DC | PRN
  Administered 2022-08-11: 4 mg via INTRAVENOUS

## 2022-08-11 MED ORDER — INSULIN GLARGINE-YFGN 100 UNIT/ML ~~LOC~~ SOLN
28.0000 [IU] | Freq: Every day | SUBCUTANEOUS | Status: DC
  Administered 2022-08-12 – 2022-08-14 (×3): 28 [IU] via SUBCUTANEOUS
  Filled 2022-08-11 (×4): qty 0.28

## 2022-08-11 MED ORDER — RINGERS IRRIGATION IR SOLN
Status: DC | PRN
  Administered 2022-08-11: 1

## 2022-08-11 MED ORDER — ONDANSETRON HCL 4 MG/2ML IJ SOLN
INTRAMUSCULAR | Status: AC
  Filled 2022-08-11: qty 2

## 2022-08-11 MED ORDER — DEXAMETHASONE SODIUM PHOSPHATE 10 MG/ML IJ SOLN
INTRAMUSCULAR | Status: DC | PRN
  Administered 2022-08-11: 10 mg via INTRAVENOUS

## 2022-08-11 MED ORDER — BUPIVACAINE-EPINEPHRINE 0.25% -1:200000 IJ SOLN
INTRAMUSCULAR | Status: DC | PRN
  Administered 2022-08-11: 30 mL

## 2022-08-11 MED ORDER — FENTANYL CITRATE PF 50 MCG/ML IJ SOSY: 25.0000 ug | PREFILLED_SYRINGE | INTRAMUSCULAR | Status: DC | PRN

## 2022-08-11 MED ORDER — KCL IN DEXTROSE-NACL 20-5-0.45 MEQ/L-%-% IV SOLN
INTRAVENOUS | Status: DC
  Filled 2022-08-11: qty 1000

## 2022-08-11 MED ORDER — PROMETHAZINE HCL 25 MG/ML IJ SOLN
INTRAMUSCULAR | Status: AC
  Administered 2022-08-11: 6.25 mg
  Filled 2022-08-11: qty 1

## 2022-08-11 MED ORDER — PROPOFOL 10 MG/ML IV BOLUS
INTRAVENOUS | Status: AC
Start: 2022-08-11 — End: ?
  Filled 2022-08-11: qty 20

## 2022-08-11 MED ORDER — FENTANYL CITRATE (PF) 100 MCG/2ML IJ SOLN
INTRAMUSCULAR | Status: DC | PRN
  Administered 2022-08-11 (×2): 50 ug via INTRAVENOUS
  Administered 2022-08-11: 100 ug via INTRAVENOUS
  Administered 2022-08-11: 50 ug via INTRAVENOUS

## 2022-08-11 MED ORDER — MIDAZOLAM HCL 5 MG/5ML IJ SOLN
INTRAMUSCULAR | Status: DC | PRN
  Administered 2022-08-11 (×2): 1 mg via INTRAVENOUS

## 2022-08-11 MED ORDER — BUPIVACAINE LIPOSOME 1.3 % IJ SUSP
INTRAMUSCULAR | Status: DC | PRN
  Administered 2022-08-11: 20 mL

## 2022-08-11 MED ORDER — ZINC OXIDE 40 % EX OINT
1.0000 | TOPICAL_OINTMENT | CUTANEOUS | Status: DC | PRN
  Filled 2022-08-11: qty 57

## 2022-08-11 MED ORDER — SCOPOLAMINE 1 MG/3DAYS TD PT72: 1.0000 | MEDICATED_PATCH | Freq: Once | TRANSDERMAL | Status: DC

## 2022-08-11 MED ORDER — HYDROXYZINE HCL 10 MG PO TABS: 10.0000 mg | ORAL_TABLET | Freq: Three times a day (TID) | ORAL | Status: DC | PRN

## 2022-08-11 MED ORDER — INSULIN ASPART 100 UNIT/ML IJ SOLN
0.0000 [IU] | Freq: Every day | INTRAMUSCULAR | Status: DC
  Administered 2022-08-11 – 2022-08-13 (×2): 3 [IU] via SUBCUTANEOUS

## 2022-08-11 MED ORDER — BUPIVACAINE LIPOSOME 1.3 % IJ SUSP
INTRAMUSCULAR | Status: AC
  Filled 2022-08-11: qty 20

## 2022-08-11 MED ORDER — ROCURONIUM BROMIDE 10 MG/ML (PF) SYRINGE
PREFILLED_SYRINGE | INTRAVENOUS | Status: AC
  Filled 2022-08-11: qty 10

## 2022-08-11 MED ORDER — SACCHAROMYCES BOULARDII 250 MG PO CAPS
250.0000 mg | ORAL_CAPSULE | Freq: Two times a day (BID) | ORAL | Status: DC
  Administered 2022-08-11 – 2022-08-14 (×5): 250 mg via ORAL
  Filled 2022-08-11 (×7): qty 1

## 2022-08-11 MED ORDER — POLYETHYLENE GLYCOL 3350 17 GM/SCOOP PO POWD: 1.0000 | Freq: Once | ORAL | Status: DC

## 2022-08-11 MED ORDER — INSULIN ASPART 100 UNIT/ML IJ SOLN
0.0000 [IU] | Freq: Three times a day (TID) | INTRAMUSCULAR | Status: DC
  Administered 2022-08-11: 11 [IU] via SUBCUTANEOUS
  Administered 2022-08-12 (×3): 20 [IU] via SUBCUTANEOUS
  Administered 2022-08-13: 4 [IU] via SUBCUTANEOUS
  Administered 2022-08-13: 15 [IU] via SUBCUTANEOUS
  Administered 2022-08-14: 4 [IU] via SUBCUTANEOUS

## 2022-08-11 MED ORDER — ACETAMINOPHEN 500 MG PO TABS
1000.0000 mg | ORAL_TABLET | ORAL | Status: AC
  Administered 2022-08-11: 1000 mg via ORAL
  Filled 2022-08-11: qty 2

## 2022-08-11 MED ORDER — NYSTATIN 100000 UNIT/GM EX POWD
Freq: Two times a day (BID) | CUTANEOUS | Status: DC
  Filled 2022-08-11: qty 15

## 2022-08-11 MED ORDER — SIMETHICONE 80 MG PO CHEW: 40.0000 mg | CHEWABLE_TABLET | Freq: Four times a day (QID) | ORAL | Status: DC | PRN

## 2022-08-11 MED ORDER — SUGAMMADEX SODIUM 200 MG/2ML IV SOLN
INTRAVENOUS | Status: DC | PRN
  Administered 2022-08-11: 400 mg via INTRAVENOUS

## 2022-08-11 MED ORDER — ALBUTEROL SULFATE (2.5 MG/3ML) 0.083% IN NEBU: 2.5000 mg | INHALATION_SOLUTION | Freq: Four times a day (QID) | RESPIRATORY_TRACT | Status: DC | PRN

## 2022-08-11 MED ORDER — FENTANYL CITRATE (PF) 250 MCG/5ML IJ SOLN
INTRAMUSCULAR | Status: AC
  Filled 2022-08-11: qty 5

## 2022-08-11 MED ORDER — ENSURE PRE-SURGERY PO LIQD: 296.0000 mL | Freq: Once | ORAL | Status: DC

## 2022-08-11 MED ORDER — LACTATED RINGERS IV SOLN: INTRAVENOUS | Status: DC

## 2022-08-11 MED ORDER — HYDROMORPHONE HCL 1 MG/ML IJ SOLN: 0.5000 mg | INTRAMUSCULAR | Status: DC | PRN

## 2022-08-11 MED ORDER — BISACODYL 5 MG PO TBEC: 20.0000 mg | DELAYED_RELEASE_TABLET | Freq: Once | ORAL | Status: DC

## 2022-08-11 MED ORDER — INSULIN ASPART 100 UNIT/ML IJ SOLN
2.0000 [IU] | Freq: Once | INTRAMUSCULAR | Status: AC
  Administered 2022-08-11: 2 [IU] via SUBCUTANEOUS
  Filled 2022-08-11: qty 1

## 2022-08-11 MED ORDER — KETAMINE HCL 10 MG/ML IJ SOLN
INTRAMUSCULAR | Status: AC
  Filled 2022-08-11: qty 1

## 2022-08-11 MED ORDER — FUROSEMIDE 20 MG PO TABS: 20.0000 mg | ORAL_TABLET | Freq: Every day | ORAL | Status: DC | PRN

## 2022-08-11 MED ORDER — FOSFOMYCIN TROMETHAMINE 3 G PO PACK: 3.0000 g | PACK | ORAL | Status: DC

## 2022-08-11 MED ORDER — 0.9 % SODIUM CHLORIDE (POUR BTL) OPTIME
TOPICAL | Status: DC | PRN
  Administered 2022-08-11: 2000 mL

## 2022-08-11 MED ORDER — INSULIN ASPART 100 UNIT/ML IJ SOLN
2.0000 [IU] | Freq: Once | INTRAMUSCULAR | Status: AC
Start: 2022-08-11 — End: 2022-08-11
  Administered 2022-08-11: 2 [IU] via SUBCUTANEOUS

## 2022-08-11 MED ORDER — CHLORHEXIDINE GLUCONATE 0.12 % MT SOLN
15.0000 mL | Freq: Once | OROMUCOSAL | Status: AC
  Administered 2022-08-11: 15 mL via OROMUCOSAL

## 2022-08-11 MED ORDER — GLUCERNA SHAKE PO LIQD
237.0000 mL | Freq: Every day | ORAL | Status: DC
  Administered 2022-08-11 – 2022-08-14 (×3): 237 mL via ORAL
  Filled 2022-08-11 (×4): qty 237

## 2022-08-11 SURGICAL SUPPLY — 95 items
BAG COUNTER SPONGE SURGICOUNT (BAG) ×2 IMPLANT
BAG SPNG CNTER NS LX DISP (BAG) ×2
BLADE EXTENDED COATED 6.5IN (ELECTRODE) IMPLANT
CANNULA REDUC XI 12-8 STAPL (CANNULA) ×2
CANNULA REDUCER 12-8 DVNC XI (CANNULA) ×1 IMPLANT
CELLS DAT CNTRL 66122 CELL SVR (MISCELLANEOUS) IMPLANT
COVER SURGICAL LIGHT HANDLE (MISCELLANEOUS) ×4 IMPLANT
COVER TIP SHEARS 8 DVNC (MISCELLANEOUS) ×2 IMPLANT
COVER TIP SHEARS 8MM DA VINCI (MISCELLANEOUS) ×2
DRAIN CHANNEL 19F RND (DRAIN) IMPLANT
DRAPE ARM DVNC X/XI (DISPOSABLE) ×8 IMPLANT
DRAPE COLUMN DVNC XI (DISPOSABLE) ×2 IMPLANT
DRAPE DA VINCI XI ARM (DISPOSABLE) ×8
DRAPE DA VINCI XI COLUMN (DISPOSABLE) ×2
DRAPE SURG IRRIG POUCH 19X23 (DRAPES) ×2 IMPLANT
DRSG OPSITE POSTOP 4X10 (GAUZE/BANDAGES/DRESSINGS) IMPLANT
DRSG OPSITE POSTOP 4X6 (GAUZE/BANDAGES/DRESSINGS) ×1 IMPLANT
DRSG OPSITE POSTOP 4X8 (GAUZE/BANDAGES/DRESSINGS) IMPLANT
ELECT PENCIL ROCKER SW 15FT (MISCELLANEOUS) ×2 IMPLANT
ELECT REM PT RETURN 15FT ADLT (MISCELLANEOUS) ×2 IMPLANT
ENDOLOOP SUT PDS II  0 18 (SUTURE)
ENDOLOOP SUT PDS II 0 18 (SUTURE) IMPLANT
EVACUATOR SILICONE 100CC (DRAIN) IMPLANT
GLOVE BIO SURGEON STRL SZ 6.5 (GLOVE) ×6 IMPLANT
GLOVE BIOGEL PI IND STRL 7.0 (GLOVE) ×2 IMPLANT
GLOVE INDICATOR 6.5 STRL GRN (GLOVE) ×2 IMPLANT
GOWN SRG XL LVL 4 BRTHBL STRL (GOWNS) ×3 IMPLANT
GOWN STRL NON-REIN XL LVL4 (GOWNS) ×4
GOWN STRL REUS W/ TWL XL LVL3 (GOWN DISPOSABLE) ×6 IMPLANT
GOWN STRL REUS W/TWL XL LVL3 (GOWN DISPOSABLE) ×6
GRASPER SUT TROCAR 14GX15 (MISCELLANEOUS) IMPLANT
HOLDER FOLEY CATH W/STRAP (MISCELLANEOUS) ×2 IMPLANT
IRRIG SUCT STRYKERFLOW 2 WTIP (MISCELLANEOUS) ×2
IRRIGATION SUCT STRKRFLW 2 WTP (MISCELLANEOUS) ×2 IMPLANT
KIT PROCEDURE DA VINCI SI (MISCELLANEOUS)
KIT PROCEDURE DVNC SI (MISCELLANEOUS) IMPLANT
KIT TURNOVER KIT A (KITS) ×1 IMPLANT
NDL INSUFFLATION 14GA 120MM (NEEDLE) ×1 IMPLANT
NEEDLE INSUFFLATION 14GA 120MM (NEEDLE) ×2 IMPLANT
PACK CARDIOVASCULAR III (CUSTOM PROCEDURE TRAY) ×2 IMPLANT
PACK COLON (CUSTOM PROCEDURE TRAY) ×2 IMPLANT
PAD POSITIONING PINK XL (MISCELLANEOUS) ×2 IMPLANT
RELOAD STAPLE 60 3.5 BLU DVNC (STAPLE) IMPLANT
RELOAD STAPLE 60 4.3 GRN DVNC (STAPLE) IMPLANT
RELOAD STAPLER 3.5X60 BLU DVNC (STAPLE) IMPLANT
RELOAD STAPLER 4.3X60 GRN DVNC (STAPLE) ×6 IMPLANT
RETRACTOR WND ALEXIS 18 MED (MISCELLANEOUS) IMPLANT
RTRCTR WOUND ALEXIS 18CM MED (MISCELLANEOUS)
SCISSORS LAP 5X35 DISP (ENDOMECHANICALS) IMPLANT
SEAL CANN UNIV 5-8 DVNC XI (MISCELLANEOUS) ×7 IMPLANT
SEAL XI 5MM-8MM UNIVERSAL (MISCELLANEOUS) ×8
SEALER VESSEL DA VINCI XI (MISCELLANEOUS) ×2
SEALER VESSEL EXT DVNC XI (MISCELLANEOUS) ×2 IMPLANT
SOLUTION ELECTROLUBE (MISCELLANEOUS) ×2 IMPLANT
SPIKE FLUID TRANSFER (MISCELLANEOUS) IMPLANT
STAPLER 60 DA VINCI SURE FORM (STAPLE) ×2
STAPLER 60 SUREFORM DVNC (STAPLE) ×1 IMPLANT
STAPLER CANNULA SEAL DVNC XI (STAPLE) ×1 IMPLANT
STAPLER CANNULA SEAL XI (STAPLE) ×2
STAPLER ECHELON POWER CIR 29 (STAPLE) IMPLANT
STAPLER ECHELON POWER CIR 31 (STAPLE) ×1 IMPLANT
STAPLER RELOAD 3.5X60 BLU DVNC (STAPLE)
STAPLER RELOAD 3.5X60 BLUE (STAPLE)
STAPLER RELOAD 4.3X60 GREEN (STAPLE) ×6
STAPLER RELOAD 4.3X60 GRN DVNC (STAPLE) ×6
STOPCOCK 4 WAY LG BORE MALE ST (IV SETS) ×2 IMPLANT
SUT ETHILON 2 0 PS N (SUTURE) IMPLANT
SUT NOVA NAB DX-16 0-1 5-0 T12 (SUTURE) ×4 IMPLANT
SUT PROLENE 2 0 KS (SUTURE) ×1 IMPLANT
SUT SILK 2 0 (SUTURE) ×2
SUT SILK 2 0 SH CR/8 (SUTURE) ×1 IMPLANT
SUT SILK 2-0 18XBRD TIE 12 (SUTURE) ×2 IMPLANT
SUT SILK 3 0 (SUTURE)
SUT SILK 3 0 SH CR/8 (SUTURE) ×2 IMPLANT
SUT SILK 3-0 18XBRD TIE 12 (SUTURE) IMPLANT
SUT V-LOC BARB 180 2/0GR6 GS22 (SUTURE)
SUT VIC AB 2-0 SH 18 (SUTURE) IMPLANT
SUT VIC AB 2-0 SH 27 (SUTURE)
SUT VIC AB 2-0 SH 27X BRD (SUTURE) IMPLANT
SUT VIC AB 3-0 SH 18 (SUTURE) IMPLANT
SUT VIC AB 4-0 PS2 27 (SUTURE) ×4 IMPLANT
SUT VICRYL 0 UR6 27IN ABS (SUTURE) ×2 IMPLANT
SUTURE V-LC BRB 180 2/0GR6GS22 (SUTURE) IMPLANT
SYR 10ML ECCENTRIC (SYRINGE) ×2 IMPLANT
SYS LAPSCP GELPORT 120MM (MISCELLANEOUS)
SYS WOUND ALEXIS 18CM MED (MISCELLANEOUS) ×2
SYSTEM LAPSCP GELPORT 120MM (MISCELLANEOUS) IMPLANT
SYSTEM WOUND ALEXIS 18CM MED (MISCELLANEOUS) ×1 IMPLANT
TOWEL GREEN STERILE (TOWEL DISPOSABLE) ×1 IMPLANT
TOWEL OR 17X26 10 PK STRL BLUE (TOWEL DISPOSABLE) IMPLANT
TOWEL OR NON WOVEN STRL DISP B (DISPOSABLE) ×2 IMPLANT
TRAY FOLEY MTR SLVR 16FR STAT (SET/KITS/TRAYS/PACK) ×2 IMPLANT
TROCAR ADV FIXATION 5X100MM (TROCAR) ×2 IMPLANT
TUBING CONNECTING 10 (TUBING) ×4 IMPLANT
TUBING INSUFFLATION 10FT LAP (TUBING) ×2 IMPLANT

## 2022-08-11 NOTE — H&P (Signed)
GYNECOLOGIC ONCOLOGY NEW PATIENT CONSULTATION  Date of Service: 08/11/22 Requesting Provider: Dr. Leighton Ruff Consulting Provider: Jeral Pinch, MD  HISTORY OF PRESENT ILLNESS: [name] is a 61 y.o. woman who is seen in consultation at the request of Dr. Marcello Moores for evaluation of possible need for total hysterectomy at the time of her colon cancer surgery today.  The patient was admitted in January after she presented with altered mental status, found to be severely hypoglycemic at her skilled nursing facility.  She was admitted in the setting of acute metabolic encephalopathy and noted to have sepsis believed to be related to healthcare acquired pneumonia.  During that admission, she was found to have elevated LFTs.  Liver ultrasound was nondiagnostic secondary to body habitus.  She ultimately underwent colonoscopy on 04/06/2022 in the setting of elevated LFTs, iron deficiency anemia, and new onset diarrhea.  Findings were notable for a polyp within the ascending colon showing well differentiated carcinoma and rectosigmoid biopsy consistent with well-moderately differentiated adenocarcinoma.  EGD showed multiple gastric polyps.  Denies diarrhea until started bowl prep for surgery. Some nausea and emesis yesterday, otherwise denies.   PAST MEDICAL HISTORY: Past Medical History:  Diagnosis Date   Anemia    Arthritis    Asthma    Cancer of sigmoid ([place])    COPD (chronic obstructive pulmonary disease) ([place])    Depression    Diabetes mellitus    Diastolic dysfunction 79/39/0300   Dyspnea    Generalized weakness    Hyperlipidemia    Hypertension    Hypothyroidism    Obesity    Palpitations    Pneumonia    PONV (postoperative nausea and vomiting)    Prolonged QT interval 05/14/2015   Sleep apnea     PAST SURGICAL HISTORY: Past Surgical History:  Procedure Laterality Date   BIOPSY  04/06/2022   Procedure: BIOPSY;  Surgeon: Harvel Quale, MD;  [facility];  Service: Gastroenterology;;   CATARACT EXTRACTION     CESAREAN SECTION     CHOLECYSTECTOMY     COLONOSCOPY WITH PROPOFOL N/A 04/06/2022   Procedure: COLONOSCOPY WITH PROPOFOL;  Surgeon: Harvel Quale, MD;  [facility];  Service: Gastroenterology;  Laterality: N/A;  805 ASA 2 patient in [place]   ESOPHAGOGASTRODUODENOSCOPY (EGD) WITH PROPOFOL N/A 04/06/2022   Procedure: ESOPHAGOGASTRODUODENOSCOPY (EGD) WITH PROPOFOL;  Surgeon: Harvel Quale, MD;  [facility];  Service: Gastroenterology;  Laterality: N/A;   HEMOSTASIS CLIP PLACEMENT  04/06/2022   Procedure: HEMOSTASIS CLIP PLACEMENT;  Surgeon: Harvel Quale, MD;  [facility];  Service: Gastroenterology;;   POLYPECTOMY  04/06/2022   Procedure: POLYPECTOMY;  Surgeon: Montez Morita, Quillian Quince, MD;  [facility];  Service: Gastroenterology;;   SUBMUCOSAL TATTOO INJECTION  04/06/2022   Procedure: SUBMUCOSAL TATTOO INJECTION;  Surgeon: Harvel Quale, MD;  [facility];  Service: Gastroenterology;;    OB/GYN HISTORY: OB History  Gravida Para Term Preterm AB Living  '3 2 2   1 2  '$ SAB IAB Ectopic Multiple Live Births  1            # Outcome Date GA Lbr Len/2nd Weight Sex Delivery Anes PTL Lv  3 SAB           2 Term           1 Term             Age at menopause: 89 Hx  of HRT: denies Last pap: 4 months ago per patient - PCP performed, normal per her report History of abnormal pap smears: denies  SCREENING STUDIES:  Last mammogram: 2023 Last colonoscopy: 2023  MEDICATIONS:  Current Facility-Administered Medications:    bupivacaine liposome (EXPAREL) 1.3 % injection 266 mg, 20 mL, Infiltration, Once, Leighton Ruff, MD   cefoTEtan (CEFOTAN) 2 g in sodium chloride 0.9 % 100 mL IVPB, 2 g, Intravenous, On Call to OR, Leighton Ruff, MD   lactated ringers infusion, , Intravenous, Continuous, Roderic Palau, MD,  Last Rate: 10 mL/hr at 08/11/22 0646, New Bag at 08/11/22 0646  ALLERGIES: Allergies  Allergen Reactions   Carvedilol Palpitations   Benicar [Olmesartan] Swelling   Codeine Other (See Comments)    Confusion    Sulfa Antibiotics Swelling    Whole face swells   Trulicity [Dulaglutide] Diarrhea    FAMILY HISTORY: Family History  Problem Relation Age of Onset   Stroke Mother    Diabetes Father    Heart failure Father    Hypertension Father    Diabetes Sister    Heart failure Sister    Hypertension Sister    Stroke Sister    Cancer Other    Stroke Sister     SOCIAL HISTORY: Social History   Socioeconomic History   Marital status: Married    Spouse name: Not on file   Number of children: Not on file   Years of education: Not on file   Highest education level: Not on file  Occupational History   Not on file  Tobacco Use   Smoking status: Never    Passive exposure: Yes   Smokeless tobacco: Never  Vaping Use   Vaping Use: Never used  Substance and Sexual Activity   Alcohol use: No    Alcohol/week: 0.0 standard drinks of alcohol   Drug use: No   Sexual activity: Yes    Birth control/protection: None  Other Topics Concern   Not on file  Social History Narrative   Lives with husband.  One living son.  Daughter died after transplant age 47.     Social Determinants of Health   Financial Resource Strain: Not on file  Food Insecurity: Not on file  Transportation Needs: Not on file  Physical Activity: Not on file  Stress: Not on file  Social Connections: Not on file  Intimate Partner Violence: Not on file    REVIEW OF SYSTEMS: Complete 10-system review is negative except for the following: + peripheral neuropathy  PHYSICAL EXAM: BP (!) 158/61   Pulse 62   Temp 98.3 F (36.8 C) (Oral)   Resp (!) 189   Wt 258 lb (117 kg)   LMP 08/06/2012   SpO2 93%   BMI 48.75 kg/m  General: Alert, oriented, no acute distress. HEENT: Normocephalic, atraumatic.  Sclera  anicteric. Chest: Clear to auscultation bilaterally. No wheezes on rhonchi. Cardiovascular: Regular rate and rhythm, no murmurs, rubs, or gallops. Abdomen: Obese.  Normoactive bowel sounds.  Obese, soft, nondistended, nontender to palpation.  No masses or hepatosplenomegaly appreciated.   Extremities: Grossly normal range of motion.  Warm, well perfused.  Trace edema bilaterally. GU: Deferred.  LABORATORY AND RADIOLOGIC DATA: CT C/A/P 04/27/22: IMPRESSION: 1. No evidence of mass, lymphadenopathy or metastatic disease in the chest, abdomen, or pelvis. 2. Clustered subsolid nodular opacities in the superior segment left lower lobe measuring up to 0.6 cm. Previously seen extensive bilateral heterogeneous and ground-glass airspace disease is resolved. Findings are most consistent with  minimal residual or recurrent infection or inflammation. Attention on follow-up. 3. Coronary artery disease.  Pelvic MRI 05/10/22: 1. Please see above limitations secondary to patient body habitus, motion, as well as exam being protocoled to evaluate a rectal cancer and the lesion being positioned in the sigmoid. 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 extension into the sigmoid mesocolon. Intimate association with the adjacent uterus, without gross invasion. Cannot exclude low-grade partial obstruction, given upstream stool burden. 3. Sigmoid mesocolon node, borderline size but suspicious based on location. Right external iliac nodes are favored to be reactive.  ASSESSMENT AND PLAN: [name] is a 61 y.o. woman with adenocarcinoma of the colon undergoing surgical resection today, possibly needing concurrent total hysterectomy due to concern for colonic invasion into the posterior uterus.  We discussed imaging findings concerning for invasion into the posterior uterus.  To aid with colonic resection, the patient may need concurrent hysterectomy.  Given postmenopausal  status, discussed plan for concurrent BSO at the time of hysterectomy if necessary.  We discussed the plan for possible robotic assisted hysterectomy, bilateral salpingo-oophorectomy, possible laparotomy, any other indicated procedures. The risks of surgery were discussed in detail and she understands these to include infection; wound separation; hernia; vaginal cuff separation, injury to adjacent organs such as bowel, bladder, blood vessels, ureters and nerves; bleeding which may require blood transfusion; anesthesia risk; thromboembolic events; possible death; unforeseen complications; possible need for re-exploration; medical complications such as heart attack, stroke, pleural effusion and pneumonia. The patient will receive DVT and antibiotic prophylaxis as indicated. She voiced a clear understanding. She had the opportunity to ask questions.   The above assessment and plan was communicated to the patient's primary team.  Jeral Pinch MD Gynecologic Oncology

## 2022-08-11 NOTE — H&P (Signed)
Subjective    Chief Complaint: No chief complaint on file.       History of Present Illness:   .  Patient presented to the hospital with generalized weakness nausea and dizziness for 2 weeks.  She was found to have iron deficiency anemia and elevated LFTs.  She was suspected to have liver injury due to the antibiotic she was taken for cellulitis.  She underwent a follow-up colonoscopy.  This showed a mass in her proximal rectum that was partially obstructive.  Biopsy showed adenocarcinoma.  Mass was tattooed distally.  She had an EGD as well which showed multiple gastric polyps.  Patient has a history of diabetes, COPD, prolonged QT interval and hypertension.  She has a family history of colorectal cancer.  Surgical history is significant for C-section and cholecystectomy.  Hemoglobin A1c was 9.9 five months ago.  She resides in a SNF [facility]).  This is now improved with medication changes.     Review of Systems: A complete review of systems was obtained from the patient.  I have reviewed this information and discussed as appropriate with the patient.  See HPI as well for other ROS.     Medical History: Past Medical History         Past Medical History:  Diagnosis Date   Anemia     COPD (chronic obstructive pulmonary disease) (CMS-HCC)     Diabetes mellitus without complication (CMS-HCC)     GERD (gastroesophageal reflux disease)     Hypertension          There is no problem list on file for this patient.     Past Surgical History           Past Surgical History:  Procedure Laterality Date   CATARACT EXTRACTION       CESAREAN SECTION       CHOLECYSTECTOMY            Allergies           Allergies  Allergen Reactions   Carvedilol Palpitations   Dulaglutide Diarrhea   Olmesartan Hallucination and Swelling   Sulfa (Sulfonamide Antibiotics) Other (See Comments) and Swelling      Whole face swells Lip swollen     Codeine Hallucination and Other (See Comments)       Confusion                      Current Outpatient Medications on File Prior to Visit  Medication Sig Dispense Refill   albuterol 90 mcg/actuation inhaler Inhale into the lungs       amLODIPine (NORVASC) 5 MG tablet Take 5 mg by mouth once daily       atorvastatin (LIPITOR) 10 MG tablet Take 10 mg by mouth at bedtime       gabapentin (NEURONTIN) 100 MG capsule Take 100 mg by mouth 3 (three) times daily       hydrALAZINE (APRESOLINE) 25 MG tablet Take by mouth       hydrOXYzine HCL (ATARAX) 10 MG tablet TAKE  (1)  TABLET  EVERY EIGHT HOURS AS NEEDED FOR ITCHING.       insulin GLARGINE-yfgn (SEMGLEE) injection (concentration 100 units/mL) Inject subcutaneously       levothyroxine (SYNTHROID) 88 MCG tablet Take by mouth       loperamide (IMODIUM A-D) 2 mg tablet Take by mouth       magnesium oxide (MAG-OX) 400 mg (241.3 mg magnesium) tablet Take 1  tablet by mouth once daily       multivitamin tablet Take 1 tablet by mouth once daily       nystatin (MYCOSTATIN) 100,000 unit/gram ointment Topical Twice Daily       nystatin (MYCOSTATIN) 100,000 unit/gram powder Apply topically 2 (two) times daily       omeprazole (PRILOSEC) 20 MG DR capsule Take by mouth       potassium chloride (KLOR-CON) 20 MEQ ER tablet Take by mouth       propranoloL (INDERAL) 80 MG tablet         sertraline (ZOLOFT) 50 MG tablet Take by mouth        No current facility-administered medications on file prior to visit.      Family History  No family history on file.      Social History         Tobacco Use  Smoking Status Never  Smokeless Tobacco Never      Social History  Social History           Socioeconomic History   Marital status: Married  Tobacco Use   Smoking status: Never   Smokeless tobacco: Never  Vaping Use   Vaping Use: Never used  Substance and Sexual Activity   Alcohol use: Never   Drug use: Never        Objective:      Vitals:   08/11/22 0633  BP: (!) 158/61  Pulse: 62   Resp: (!) 189  Temp: 98.3 F (36.8 C)  SpO2: 93%        Exam Gen: NAD CV :RRR Lungs: CTA Abd: soft     Labs, Imaging and Diagnostic Testing: CEA: 25.7   CT Chest, abd and pelvis no evidence of metastatic disease within the chest abdomen and pelvis.     MRI shows a distal sigmoid mass approximately 4 cm in length with findings suspicious for extension into the sigmoid mesocolon with intimate association with the adjacent uterus without gross invasion.     Assessment and Plan:  Diagnoses and all orders for this visit:   Cancer of sigmoid colon (CMS-HCC)   Patient appears to have a distal sigmoid tumor with possible invasion into the uterus.  We have optimized pulmonary and cardiac risks as much as possible.   Dr. Berline Lopes and I will evaluate for possible en bloc hysterectomy if needed intra-operatively.  Her PCP has worked the past few weeks to get better blood sugar control and HgbA1c is much better.    The surgery and anatomy were described to the patient as well as the risks of surgery and the possible complications.  These include: Bleeding, deep abdominal infections and possible wound complications such as hernia and infection, damage to adjacent structures, leak of surgical connections, which can lead to other surgeries and possibly an ostomy, possible need for other procedures, such as abscess drains in radiology, possible prolonged hospital stay, possible diarrhea from removal of part of the colon, possible constipation from narcotics, possible bowel, bladder or sexual dysfunction if having rectal surgery, prolonged fatigue/weakness or appetite loss, possible early recurrence of of disease, possible complications of their medical problems such as heart disease or arrhythmias or lung problems, death (less than 1%). I believe the patient understands and wishes to proceed with the surgery.        Rosario Adie, MD   [facility]

## 2022-08-11 NOTE — Transfer of Care (Signed)
Immediate Anesthesia Transfer of Care Note  Patient: Lonie Peak Elting  Procedure(s) Performed: XI ROBOT ASSISTED LOW ANTERIOR RESECTION (Abdomen) POSSIBLE XI ROBOTIC ASSISTED TOTAL HYSTERECTOMY WITH BILATERAL SALPINGO OOPHORECTOMY  Patient Location: PACU  Anesthesia Type:General  Level of Consciousness: drowsy and patient cooperative  Airway & Oxygen Therapy: Patient Spontanous Breathing and Patient connected to face mask oxygen  Post-op Assessment: Report given to RN and Post -op Vital signs reviewed and stable  Post vital signs: Reviewed and stable  Last Vitals:  Vitals Value Taken Time  BP 118/40 08/11/22 1215  Temp    Pulse 51 08/11/22 1216  Resp 10 08/11/22 1216  SpO2 100 % 08/11/22 1216  Vitals shown include unvalidated device data.  Last Pain:  Vitals:   08/11/22 0644  TempSrc:   PainSc: 0-No pain         Complications: No notable events documented.

## 2022-08-11 NOTE — Anesthesia Postprocedure Evaluation (Signed)
Anesthesia Post Note  Patient: [name]  Procedure(s) Performed: XI ROBOT ASSISTED LOW ANTERIOR RESECTION (Abdomen)     Patient location during evaluation: PACU Anesthesia Type: General Level of consciousness: awake and alert Pain management: pain level controlled Vital Signs Assessment: post-procedure vital signs reviewed and stable Respiratory status: spontaneous breathing, nonlabored ventilation, respiratory function stable and patient connected to nasal cannula oxygen Cardiovascular status: blood pressure returned to baseline and stable Postop Assessment: no apparent nausea or vomiting Anesthetic complications: no   No notable events documented.  Last Vitals:  Vitals:   08/11/22 1515 08/11/22 1616  BP: (!) 129/55 (!) 153/61  Pulse: (!) 55 60  Resp: 16 16  Temp:    SpO2: 93% 97%    Last Pain:  Vitals:   08/11/22 1345  TempSrc:   PainSc: [place]

## 2022-08-11 NOTE — TOC Progression Note (Deleted)
Transition of Care [facility]) - Progression Note    Patient Details  Name: SAMAR VENNEMAN MRN: [id number] Date of Birth: Jun 03, 1961  [facility] CM/SW Contact  Purcell Mouton, RN Phone Number: 08/11/2022, 2:36 PM  Clinical Narrative:      Transition of Care (TOC) Screening Note   Patient Details  Name: AUDRINA MARTEN Date of Birth: 03/03/1961   Transition of Care [facility]) CM/SW Contact:    Purcell Mouton, RN Phone Number: 08/11/2022, 2:36 PM    Transition of Care Department [facility]) has reviewed patient and no TOC needs have been identified at this time. We will continue to monitor patient advancement through interdisciplinary progression rounds. If new patient transition needs arise, please place a TOC consult.         Expected Discharge Plan and Services                                                 Social Determinants of Health (SDOH) Interventions    Readmission Risk Interventions    12/17/2021   11:29 AM 11/23/2021    2:32 PM  Readmission Risk Prevention Plan  Transportation Screening Complete Complete  Home Care Screening Complete   Medication Review (RN CM) Complete Complete

## 2022-08-11 NOTE — Op Note (Signed)
08/11/2022  11:56 AM  PATIENT:  [name]  61 y.o. female  Patient Care Team: Bridget Hartshorn, NP as PCP - General (Adult Health Nurse Practitioner) Danie Binder, MD (Inactive) as Consulting Physician (Gastroenterology) Derek Jack, MD as Medical Oncologist (Medical Oncology) Brien Mates, RN as Oncology Nurse Navigator (Medical Oncology)  PRE-OPERATIVE DIAGNOSIS:  COLON CANCER  POST-OPERATIVE DIAGNOSIS:  SIGMOID COLON CANCER  PROCEDURE:  XI ROBOT ASSISTED LOW ANTERIOR RESECTION    Surgeon(s): Leighton Ruff, MD Ileana Roup, MD   ASSISTANT: Dr Dema Severin   ANESTHESIA:   local and general  EBL:143m  Total I/O In: 1300 [I.V.:1200; IV Piggyback:100] Out: 100 [Blood:100]  Delay start of Pharmacological VTE agent (>24hrs) due to surgical blood loss or risk of bleeding:  no  DRAINS: none   SPECIMEN:  Source of Specimen:  rectosigmoid colon  DISPOSITION OF SPECIMEN:  PATHOLOGY  COUNTS:  YES  PLAN OF CARE: Admit to inpatient   PATIENT DISPOSITION:  PACU - hemodynamically stable.  INDICATION:    61y.o. F with distal sigmoid colon cancer.  I recommended surgical resection:  The anatomy & physiology of the digestive tract was discussed.  The pathophysiology was discussed.  Natural history risks without surgery was discussed.   I worked to give an overview of the disease and the frequent need to have multispecialty involvement.  I feel the risks of no intervention will lead to serious problems that outweigh the operative risks; therefore, I recommended a partial colectomy to remove the pathology.  Laparoscopic & open techniques were discussed.   Risks such as bleeding, infection, abscess, leak, reoperation, possible ostomy, hernia, heart attack, death, and other risks were discussed.  I noted a good likelihood this will help address the problem.   Goals of post-operative recovery were discussed as well.    The patient expressed  understanding & wished to proceed with surgery.  OR FINDINGS:   Patient had mass noted at rectosigmoid junction  No obvious metastatic disease on visceral parietal peritoneum or liver.  Nodular, enlarged liver  The anastomosis rests 10 cm from the anal verge by rigid proctoscopy.  DESCRIPTION:   Informed consent was confirmed.  The patient underwent general anaesthesia without difficulty.  The patient was positioned appropriately.  VTE prevention in place.  The patient's abdomen was clipped, prepped, & draped in a sterile fashion.  Surgical timeout confirmed our plan.  The patient was positioned in reverse Trendelenburg.  Abdominal entry was gained using a Varies needle in the LUQ.  Entry was clean.  I induced carbon dioxide insufflation.  An 844mrobotic port was placed in the RUQ.  Camera inspection revealed no injury.  Extra ports were carefully placed under direct laparoscopic visualization.  I laparoscopically reflected the greater omentum and the upper abdomen the small bowel in the upper abdomen. The patient was appropriately positioned and the robot was docked to the patient's left side.  Instruments were placed under direct visualization.    I mobilized the sigmoid colon off of the pelvic sidewall. The tattoo was noted just distal to the rectosigmoid mass.  I scored the base of peritoneum of the right side of the mesentery of the left colon from the ligament of Treitz to the peritoneal reflection of the mid rectum.  The patient had a significantly fatty mesentery.  I elevated the sigmoid mesentery and enetered into the retro-mesenteric plane. We were able to identify the left ureter and gonadal vessels. We kept those posterior within the  retroperitoneum and elevated the left colon mesentery off that. I did isolated IMA pedicle but did not ligate it yet.  I continued distally and got into the avascular plane posterior to the mesorectum. This allowed me to help mobilize the rectum as well by  freeing the mesorectum off the sacrum.  I mobilized the peritoneal coverings towards the peritoneal reflection on both the right and left sides of the rectum.  I could see the left ureter and stayed away from it.    I skeletonized the inferior mesenteric artery pedicle.  I went down to its takeoff from the aorta.  After confirming the left ureter was out of the way, I went ahead and ligated the inferior mesenteric artery pedicle with bipolar robotic vessel sealer ~2cm above its takeoff from the aorta.  We ensured hemostasis and mobilized the colon off the retroperitoneum. I skeletonized the mesorectum at the junction at the proximal rectum ~2 cm distal to the mass using blunt dissection & bipolar robotic vessel sealer.  I mobilized the left colon in a lateral to medial fashion off the line of Toldt up towards the splenic flexure to ensure good mobilization of the left colon to reach into the pelvis.  I divided the mesentery with the vessel sealer to the level of the descending sigmoid junction.  There was good bleeding noted in the marginal artery.  We then used a green robotic load stapler to divide the proximal rectum.  Due to the positioning of the stapler port, this was done using 3 different stapler firings.  The robot was then undocked.  The 12 mm suprapubic port was enlarged to a Pfannenstiel incision.  An Melvin wound protector was placed.  The colon was brought out of the wound and divided over a pursestring device at the previously cleared descending colon.  A 2-0 Prolene pursestring was placed.  This was secured with 3-0 silk sutures.  A 31 mm EEA anvil was then placed inside the colon and the pursestring was tied tightly around this.  This was then placed back into the abdomen.  The EEA stapler was introduced into the rectum and brought out through the rectal stump.  An anastomosis was created.  There was no tension noted on the anastomosis.  There was no leak with irrigation under insufflation.  The  anastomosis rest approximately 10 cm from the sphincter complex.  The abdomen was irrigated.  Hemostasis was good.  We switched to clean gowns, gloves, instruments and drapes.  The peritoneum of the Pfannenstiel incision was closed using a running 0 Vicryl suture.  The fascia was closed using 2, #1 running Novafil sutures.  Subcutaneous tissue was reapproximated using a running 2-0 Vicryl suture and the skin was closed using a running 4-0 Vicryl subcuticular suture and Dermabond.  The remaining port sites were also closed using interrupted 4-0 Vicryl subcuticular sutures and Dermabond.  The patient was then awakened from anesthesia and sent to the postanesthesia care unit in stable condition.  All counts were correct per operating room staff.  An MD assistant was necessary for tissue manipulation, retraction and positioning due to the complexity of the case and hospital policies.   Rosario Adie, MD  [facility]

## 2022-08-11 NOTE — Anesthesia Procedure Notes (Signed)
Procedure Name: Intubation Date/Time: 08/11/2022 8:51 AM  Performed by: Jonna Munro, CRNAPre-anesthesia Checklist: Patient identified, Emergency Drugs available, Suction available, Patient being monitored and Timeout performed Patient Re-evaluated:Patient Re-evaluated prior to induction Oxygen Delivery Method: Circle system utilized Induction Type: IV induction Ventilation: Mask ventilation without difficulty Laryngoscope Size: Glidescope, Mac and 3 Tube type: Oral Tube size: 7.0 mm Number of attempts: 1 Airway Equipment and Method: Rigid stylet and Video-laryngoscopy Placement Confirmation: ETT inserted through vocal cords under direct vision, positive ETCO2, CO2 detector and breath sounds checked- equal and bilateral Secured at: 22 cm Tube secured with: Tape Dental Injury: Teeth and Oropharynx as per pre-operative assessment

## 2022-08-11 NOTE — TOC Progression Note (Signed)
Transition of Care [facility]) - Progression Note    Patient Details  Name: [name] MRN: [id number] Date of Birth: 05/14/1961  Transition of Care [facility]) CM/SW Contact  Purcell Mouton, RN Phone Number: 08/11/2022, 2:44 PM  Clinical Narrative:     Pt is LT resident at [facility].   Expected Discharge Plan: Skilled Nursing Facility Barriers to Discharge: No Barriers Identified  Expected Discharge Plan and Services Expected Discharge Plan: [place]                                               Social Determinants of Health (SDOH) Interventions    Readmission Risk Interventions    12/17/2021   11:29 AM 11/23/2021    2:32 PM  Readmission Risk Prevention Plan  Transportation Screening Complete Complete  Home Care Screening Complete   Medication Review (RN CM) Complete Complete

## 2022-08-11 NOTE — Anesthesia Procedure Notes (Signed)
Arterial Line Insertion Start/End9/05/2022 8:51 AM, 08/11/2022 8:56 AM Performed by: Santa Lighter, MD, anesthesiologist  Patient location: OR. Preanesthetic checklist: patient identified, IV checked, site marked, risks and benefits discussed, surgical consent, monitors and equipment checked, pre-op evaluation, timeout performed and anesthesia consent Lidocaine 1% used for infiltration Left, radial was placed Catheter size: 20 G Hand hygiene performed  and maximum sterile barriers used   Attempts: 1 Procedure performed without using ultrasound guided technique. Following insertion, dressing applied and Biopatch. Post procedure assessment: normal and unchanged  Patient tolerated the procedure well with no immediate complications.

## 2022-08-12 ENCOUNTER — Other Ambulatory Visit: Payer: Self-pay

## 2022-08-12 DIAGNOSIS — I5032 Chronic diastolic (congestive) heart failure: Secondary | ICD-10-CM

## 2022-08-12 DIAGNOSIS — F32A Depression, unspecified: Secondary | ICD-10-CM

## 2022-08-12 DIAGNOSIS — C189 Malignant neoplasm of colon, unspecified: Secondary | ICD-10-CM | POA: Diagnosis not present

## 2022-08-12 DIAGNOSIS — E782 Mixed hyperlipidemia: Secondary | ICD-10-CM

## 2022-08-12 DIAGNOSIS — I10 Essential (primary) hypertension: Secondary | ICD-10-CM

## 2022-08-12 DIAGNOSIS — D509 Iron deficiency anemia, unspecified: Secondary | ICD-10-CM

## 2022-08-12 DIAGNOSIS — K219 Gastro-esophageal reflux disease without esophagitis: Secondary | ICD-10-CM

## 2022-08-12 DIAGNOSIS — J449 Chronic obstructive pulmonary disease, unspecified: Secondary | ICD-10-CM

## 2022-08-12 DIAGNOSIS — E1165 Type 2 diabetes mellitus with hyperglycemia: Secondary | ICD-10-CM

## 2022-08-12 LAB — BASIC METABOLIC PANEL
Anion gap: 9 (ref 5–15)
Anion gap: 11 (ref 5–15)
Anion gap: 6 (ref 5–15)
BUN: 24 mg/dL — ABNORMAL HIGH (ref 8–23)
BUN: 26 mg/dL — ABNORMAL HIGH (ref 8–23)
BUN: 25 mg/dL — ABNORMAL HIGH (ref 8–23)
CO2: 23 mmol/L (ref 22–32)
CO2: 20 mmol/L — ABNORMAL LOW (ref 22–32)
CO2: 25 mmol/L (ref 22–32)
Calcium: 9 mg/dL (ref 8.9–10.3)
Calcium: 9 mg/dL (ref 8.9–10.3)
Calcium: 8.9 mg/dL (ref 8.9–10.3)
Chloride: 106 mmol/L (ref 98–111)
Chloride: 104 mmol/L (ref 98–111)
Chloride: 106 mmol/L (ref 98–111)
Creatinine, Ser: 1.32 mg/dL — ABNORMAL HIGH (ref 0.44–1.00)
Creatinine, Ser: 1.41 mg/dL — ABNORMAL HIGH (ref 0.44–1.00)
Creatinine, Ser: 1.38 mg/dL — ABNORMAL HIGH (ref 0.44–1.00)
GFR, Estimated: 46 mL/min — ABNORMAL LOW (ref >60)
GFR, Estimated: 42 mL/min — ABNORMAL LOW (ref >60)
GFR, Estimated: 44 mL/min — ABNORMAL LOW (ref >60)
Glucose, Bld: 372 mg/dL — ABNORMAL HIGH (ref 70–99)
Glucose, Bld: 556 mg/dL (ref 70–99)
Glucose, Bld: 240 mg/dL — ABNORMAL HIGH (ref 70–99)
Potassium: 4.5 mmol/L (ref 3.5–5.1)
Potassium: 4.7 mmol/L (ref 3.5–5.1)
Potassium: 4.1 mmol/L (ref 3.5–5.1)
Sodium: 138 mmol/L (ref 135–145)
Sodium: 135 mmol/L (ref 135–145)
Sodium: 137 mmol/L (ref 135–145)

## 2022-08-12 LAB — CBC
HCT: 30.8 % — ABNORMAL LOW (ref 36.0–46.0)
Hemoglobin: 9.3 g/dL — ABNORMAL LOW (ref 12.0–15.0)
MCH: 22.6 pg — ABNORMAL LOW (ref 26.0–34.0)
MCHC: 30.2 g/dL (ref 30.0–36.0)
MCV: 74.9 fL — ABNORMAL LOW (ref 80.0–100.0)
Platelets: 294 10*3/uL (ref 150–400)
RBC: 4.11 MIL/uL (ref 3.87–5.11)
RDW: 23.5 % — ABNORMAL HIGH (ref 11.5–15.5)
WBC: 11.5 10*3/uL — ABNORMAL HIGH (ref 4.0–10.5)
nRBC: 0 % (ref 0.0–0.2)

## 2022-08-12 LAB — GLUCOSE, CAPILLARY
Glucose-Capillary: 386 mg/dL — ABNORMAL HIGH (ref 70–99)
Glucose-Capillary: 513 mg/dL (ref 70–99)
Glucose-Capillary: 506 mg/dL (ref 70–99)
Glucose-Capillary: 443 mg/dL — ABNORMAL HIGH (ref 70–99)
Glucose-Capillary: 362 mg/dL — ABNORMAL HIGH (ref 70–99)
Glucose-Capillary: 174 mg/dL — ABNORMAL HIGH (ref 70–99)

## 2022-08-12 MED ORDER — SODIUM CHLORIDE 0.45 % IV BOLUS
1000.0000 mL | Freq: Once | INTRAVENOUS | Status: AC
Start: 2022-08-12 — End: 2022-08-12
  Administered 2022-08-12: 1000 mL via INTRAVENOUS

## 2022-08-12 MED ORDER — CHLORHEXIDINE GLUCONATE CLOTH 2 % EX PADS
6.0000 | MEDICATED_PAD | Freq: Every day | CUTANEOUS | Status: DC
  Administered 2022-08-12: 6 via TOPICAL

## 2022-08-12 MED ORDER — SODIUM CHLORIDE 0.45 % IV SOLN: INTRAVENOUS | Status: DC

## 2022-08-12 MED ORDER — INSULIN ASPART 100 UNIT/ML IJ SOLN
20.0000 [IU] | Freq: Once | INTRAMUSCULAR | Status: AC
  Administered 2022-08-12: 20 [IU] via SUBCUTANEOUS

## 2022-08-12 MED ORDER — INSULIN GLARGINE-YFGN 100 UNIT/ML ~~LOC~~ SOLN
30.0000 [IU] | Freq: Every day | SUBCUTANEOUS | Status: DC
  Administered 2022-08-12 – 2022-08-13 (×2): 30 [IU] via SUBCUTANEOUS
  Filled 2022-08-12 (×3): qty 0.3

## 2022-08-12 NOTE — Addendum Note (Signed)
Addendum  created 08/12/22 0618 by West Pugh, CRNA   Charge Capture section accepted

## 2022-08-12 NOTE — Evaluation (Signed)
Occupational Therapy Evaluation Patient Details Name: [name] MRN: [id number] DOB: July 10, 1961 Today's Date: 08/12/2022   History of Present Illness Patient presented to the hospital with generalized weakness nausea and dizziness for 2 weeks.  She was suspected to have liver injury due to the antibiotic she was taken for cellulitis.  She underwent a follow-up colonoscopy. Patient has a history of diabetes, COPD, prolonged QT interval and hypertension.  Patient Post-Op robotic assisted LAR   Clinical Impression   Mrs. Cedricka Sackrider is a 61 year old woman s/p robotic assisted LAR, who presents with pain and decrease activity tolerance. Prior to hospitalization, patient could complete upper body bathing and dressing tasks independently, but required moderate assistance for lowerbody bathing and dressing. Patient used a wheelchair for mobilization in skilled nursing facility. Today patient presents with +2 mod assistance for supine to EOB, and mod assistance for sit to stand. Patient completed a step pivot transfer to chair with min a. Patient presents similar to baseline with reliance on adaptive equipment or assistance from nursing staff.  Therapist recommends returning to nursing facility with OT services. No acute OT needs.      Recommendations for follow up therapy are one component of a multi-disciplinary discharge planning process, led by the attending physician.  Recommendations may be updated based on patient status, additional functional criteria and insurance authorization.   Follow Up Recommendations  Skilled nursing-short term rehab (<3 hours/day)    Assistance Recommended at Discharge Intermittent Supervision/Assistance  Patient can return home with the following A lot of help with bathing/dressing/bathroom;Assistance with cooking/housework;Assist for transportation;Help with stairs or ramp for entrance;A lot of help with walking and/or transfers    Functional Status  Assessment  Patient has had a recent decline in their functional status and demonstrates the ability to make significant improvements in function in a reasonable and predictable amount of time.  Equipment Recommendations  None recommended by OT    Recommendations for Other Services       Precautions / Restrictions Precautions Precautions: Fall Restrictions Weight Bearing Restrictions: No      Mobility Bed Mobility Overal bed mobility: Needs Assistance Bed Mobility: Supine to Sit     Supine to sit: +2 for safety/equipment, +2 for physical assistance, Mod assist     General bed mobility comments: limited by incision pain and body habitus. Patient assisted and used bed rails.    Transfers Overall transfer level: Needs assistance Equipment used: Rolling walker (2 wheels) Transfers: Sit to/from Stand, Bed to chair/wheelchair/BSC Sit to Stand: Mod assist     Step pivot transfers: Min guard, +2 safety/equipment     General transfer comment: mod to stand from low bed height. Min guard to take steps with walker.      Balance Overall balance assessment: Mild deficits observed, not formally tested   Sitting balance-Leahy Scale: Good                                     ADL either performed or assessed with clinical judgement   ADL Overall ADL's : Needs assistance/impaired Eating/Feeding: Independent   Grooming: Independent;Brushing hair   Upper Body Bathing: Set up;Minimal assistance;Sitting   Lower Body Bathing: Maximal assistance;+2 for safety/equipment;Sit to/from stand   Upper Body Dressing : Set up;Sitting   Lower Body Dressing: +2 for physical assistance;+2 for safety/equipment;Maximal assistance   Toilet Transfer: +2 for safety/equipment;Minimal assistance;Set up;BSC/3in1   Toileting- Clothing Manipulation  and Hygiene: Maximal assistance;With adaptive equipment;+2 for physical assistance Toileting - Clothing Manipulation Details (indicate cue  type and reason): pt states nurses at her faclility assist with putting on her diaper.     Functional mobility during ADLs: +2 for physical assistance;Moderate assistance;Rolling walker (2 wheels) General ADL Comments: mod assist to stand from low bed height. Min guard to take 3-4 steps to recliner.     Vision   Vision Assessment?: No apparent visual deficits     Perception     Praxis      Pertinent Vitals/Pain Pain Assessment Pain Assessment: Faces Faces Pain Scale: Hurts little more Pain Location: patient expressed pain at surgical site on lower stomach when transferring from supine to EOB. Pain Intervention(s): Monitored during session, Repositioned     Hand Dominance Right   Extremity/Trunk Assessment Upper Extremity Assessment Upper Extremity Assessment: Overall WFL for tasks assessed   Lower Extremity Assessment Lower Extremity Assessment: Defer to PT evaluation   Cervical / Trunk Assessment Cervical / Trunk Assessment: Normal   Communication Communication Communication: No difficulties   Cognition Arousal/Alertness: Awake/alert Behavior During Therapy: WFL for tasks assessed/performed Overall Cognitive Status: Within Functional Limits for tasks assessed                                       General Comments       Exercises     Shoulder Instructions      Home Living Family/patient expects to be discharged to:: Skilled nursing facility                                        Prior Functioning/Environment Prior Level of Function : Needs assist             Mobility Comments: pt. reports able to take minimal steps. Uses wheelchair to mobilize at facility. ADLs Comments: pt is indpendent for upper body dressing and grooming. Requires asssistance for some tolieting tasks including putting on diaper and wiping bottom and LB dressing. Uses AE as needed including reacher, toilet aide, sock aide.        OT Problem List:  Obesity;Pain      OT Treatment/Interventions:      OT Goals(Current goals can be found in the care plan section) Acute Rehab OT Goals OT Goal Formulation: All assessment and education complete, DC therapy  OT Frequency:      Co-evaluation              AM-PAC OT "6 Clicks" Daily Activity     Outcome Measure Help from another person eating meals?: None Help from another person taking care of personal grooming?: A Little Help from another person toileting, which includes using toliet, bedpan, or urinal?: A Lot Help from another person bathing (including washing, rinsing, drying)?: A Lot Help from another person to put on and taking off regular upper body clothing?: A Little Help from another person to put on and taking off regular lower body clothing?: A Lot 6 Click Score: 16   End of Session Equipment Utilized During Treatment: Rolling walker (2 wheels) Nurse Communication: Other (comment) (discussed with nurse tech patient is +2 min assist for transferring back to bed.)  Activity Tolerance: Patient tolerated treatment well Patient left: in chair;with call bell/phone within reach;with chair alarm set;with family/visitor present  OT  Visit Diagnosis: Unsteadiness on feet (R26.81);Repeated falls (R29.6);History of falling (Z91.81);Pain                Time: 8343-7357 OT Time Calculation (min): 20 min Charges:  OT General Charges $OT Visit: 1 Visit OT Evaluation $OT Eval Low Complexity: [place], OTS Acute rehab services   Charlann Lange 08/12/2022, 11:21 AM

## 2022-08-12 NOTE — Consult Note (Signed)
Initial Consultation Note   Patient: [name] WUJ:[id number] DOB: 1961/03/14 PCP: Bridget Hartshorn, NP DOA: 08/11/2022 DOS: the patient was seen and examined on 08/12/2022 Primary service: Leighton Ruff, MD  Referring physician: Leighton Ruff, MD. Reason for consult: Hyperglycemia.  Assessment and Plan: Principal Problem:   Colon cancer ([place]) S/p robotic LAR. Continue postop care per surgical team.  Active Problems:   Hyperglycemia due to diabetes mellitus ([place]) Last hemoglobin A1c 7.2% 9 days ago. NaCl 0.45% 1000 mL bolus x1. Then NaCl 0.45% at 125 mL/h x 16 hours. NovoLog 20 units SQ x1. Continue Lantus twice daily. Continue CBG monitoring before meals and bedtime.    Essential hypertension Continue amlodipine 10 mg p.o. daily.   Continue hydralazine 25 mg p.o. twice daily. Continue propranolol 80 mg p.o. daily.    COPD (chronic obstructive pulmonary disease) (HCC) Bronchodilators and/or supplemental oxygen as needed.    Depression Continue sertraline 50 mg p.o. daily. Continue hydroxyzine as needed for anxiety. May benefit from a cardioselective beta-blocker.    Mixed hyperlipidemia Resume atorvastatin 10 mg p.o. daily.    GERD (gastroesophageal reflux disease) Continue PPI twice daily.    Chronic diastolic CHF (congestive heart failure) (HCC) No signs of decompensation. Continue beta-blocker and hydralazine. Continue furosemide as needed. May benefit from ACE/ARB.    Iron deficiency anemia Monitor hematocrit and hemoglobin. Transfuse as needed.   TRH will continue to follow the patient.  HPI: [name] is a 61 y.o. female with past medical history of anemia, osteoarthritis, asthma, COPD, depression, type 2 diabetes, diastolic dysfunction, dyspnea, generalized weakness, hypertension, hyperlipidemia, hypothyroidism, class III obesity with a BMI of 50.32 kg/m at the moment, palpitations, history of pneumonia, PONV, prolonged QT interval,  sleep apnea who underwent robotic assisted LAR yesterday and we are evaluating due to hyperglycemia.  The patient was on a dextrose 5% NaCl 0.45% at 50 mL infusion yesterday and her a.m. Lantus was decreased from the usual 40 units to 28 units daily.  Lab work: CBC showed white count 11.3, hemoglobin 9.3 g/dL platelets 294.  Most recent BMP with normal electrolytes, except for a CO2 of 20 mmol/L, glucose 556, BUN 26 and creatinine 1.41 mg/dL.  Review of Systems: As mentioned in the history of present illness. All other systems reviewed and are negative. Past Medical History:  Diagnosis Date   Anemia    Arthritis    Asthma    Cancer of sigmoid ([place])    COPD (chronic obstructive pulmonary disease) ([place])    Depression    Diabetes mellitus    Diastolic dysfunction 95/62/1308   Dyspnea    Generalized weakness    Hyperlipidemia    Hypertension    Hypothyroidism    Obesity    Palpitations    Pneumonia    PONV (postoperative nausea and vomiting)    Prolonged QT interval 05/14/2015   Sleep apnea    Past Surgical History:  Procedure Laterality Date   BIOPSY  04/06/2022   Procedure: BIOPSY;  Surgeon: Harvel Quale, MD;  [facility];  Service: Gastroenterology;;   CATARACT EXTRACTION     CESAREAN SECTION     CHOLECYSTECTOMY     COLONOSCOPY WITH PROPOFOL N/A 04/06/2022   Procedure: COLONOSCOPY WITH PROPOFOL;  Surgeon: Harvel Quale, MD;  [facility]: [facility];  Service: Gastroenterology;  Laterality: N/A;  805 ASA 2 patient in [facility]   ESOPHAGOGASTRODUODENOSCOPY (EGD) WITH PROPOFOL N/A 04/06/2022   Procedure: ESOPHAGOGASTRODUODENOSCOPY (EGD) WITH PROPOFOL;  Surgeon: Montez Morita, Quillian Quince, MD;  [facility];  Service: Gastroenterology;  Laterality: N/A;   HEMOSTASIS CLIP PLACEMENT  04/06/2022   Procedure: HEMOSTASIS CLIP PLACEMENT;  Surgeon: Harvel Quale, MD;  [facility];  Service:  Gastroenterology;;   POLYPECTOMY  04/06/2022   Procedure: POLYPECTOMY;  Surgeon: Harvel Quale, MD;  [facility];  Service: Gastroenterology;;   SUBMUCOSAL TATTOO INJECTION  04/06/2022   Procedure: SUBMUCOSAL TATTOO INJECTION;  Surgeon: Harvel Quale, MD;  [facility];  Service: Gastroenterology;;   Social History:  reports that she has never smoked. She has been exposed to tobacco smoke. She has never used smokeless tobacco. She reports that she does not drink alcohol and does not use drugs.  Allergies  Allergen Reactions   Carvedilol Palpitations   Benicar [Olmesartan] Swelling   Codeine Other (See Comments)    Confusion    Sulfa Antibiotics Swelling    Whole face swells   Trulicity [Dulaglutide] Diarrhea    Family History  Problem Relation Age of Onset   Stroke Mother    Diabetes Father    Heart failure Father    Hypertension Father    Diabetes Sister    Heart failure Sister    Hypertension Sister    Stroke Sister    Cancer Other    Stroke Sister     Prior to Admission medications   Medication Sig Start Date End Date Taking? Authorizing Provider  ADMELOG 100 UNIT/ML injection Inject 4-8 Units into the skin 3 (three) times daily with meals. If blood glucose is 200-250= 4 units, 250-300= 5 units, 301-350= 6 units, 351-400= 8 units, > 400 notify MD 04/21/22  Yes [provider]  albuterol (VENTOLIN HFA) 108 (90 Base) MCG/ACT inhaler Inhale 2 puffs into the lungs every 6 (six) hours as needed for wheezing or shortness of breath.   Yes [provider]  alum & mag hydroxide-simeth (MAALOX/MYLANTA) 200-200-20 MG/5ML suspension Take 30 mLs by mouth every 2 (two) hours as needed for indigestion or heartburn. Do not exceed 4 doses in 24 hours   Yes [provider]  amLODipine (NORVASC) 10 MG tablet Take 10 mg by mouth daily. 06/28/22  Yes [provider]  ascorbic acid (VITAMIN C) 500 MG tablet Take 500 mg  by mouth daily.   Yes [provider]  atorvastatin (LIPITOR) 10 MG tablet Take 10 mg by mouth at bedtime. 09/29/21  Yes [provider]  Cholecalciferol (VITAMIN D) 50 MCG (2000 UT) CAPS Take 2,000 Units by mouth daily.   Yes [provider]  Emollient (EUCERIN) lotion Apply 1 Application topically every 12 (twelve) hours as needed for dry skin.   Yes [provider]  ferrous sulfate 325 (65 FE) MG tablet Take 325 mg by mouth daily.   Yes [provider]  fosfomycin (MONUROL) 3 g PACK Take 3 g by mouth every 3 (three) days. 05/06/22  Yes [provider]  gabapentin (NEURONTIN) 100 MG capsule Take 1 capsule (100 mg total) by mouth 3 (three) times daily. 11/26/21  Yes Johnson, Clanford L, MD  hydrALAZINE (APRESOLINE) 25 MG tablet Take 1 tablet (25 mg total) by mouth every 8 (eight) hours. 11/26/21  Yes Johnson, Clanford L, MD  insulin glargine-yfgn (SEMGLEE) 100 UNIT/ML injection Inject 0.4 mLs (40 Units total) into the skin daily. Patient taking differently: Inject 28 Units into the skin daily. 12/19/21  Yes Shah, Pratik D, DO  LANTUS 100 UNIT/ML injection Inject  30 Units into the skin at bedtime. 07/16/22  Yes [provider]  levothyroxine (SYNTHROID) 88 MCG tablet Take 88 mcg by mouth daily before breakfast.   Yes [provider]  loperamide (IMODIUM A-D) 2 MG tablet Take 2 mg by mouth as needed for diarrhea or loose stools. Do not exceed 4 per day   Yes [provider]  magnesium oxide (MAG-OX) 400 (240 Mg) MG tablet Take 400 mg by mouth daily. 11/07/21  Yes [provider]  Menthol-Zinc Oxide (CALMOSEPTINE) 0.44-20.6 % OINT Apply 1 Application topically as needed (preservation/protection after incontinent care).   Yes [provider]  omeprazole (PRILOSEC) 20 MG capsule Take 20 mg by mouth in the morning and at bedtime.   Yes [provider]  potassium chloride SA (K-DUR) 20 MEQ tablet Take 20  mEq by mouth daily.   Yes [provider]  PROCRIT 4000 UNIT/ML injection Inject 4,000 Units into the skin every Monday, Wednesday, and Friday. Hold if hemoglobin is > 10 08/01/22  Yes [provider]  propranolol (INDERAL) 80 MG tablet Take 80 mg by mouth daily.   Yes [provider]  sertraline (ZOLOFT) 50 MG tablet Take 3 tablets (150 mg total) by mouth daily. 11/27/21  Yes Johnson, Clanford L, MD  furosemide (LASIX) 20 MG tablet Take 20 mg by mouth daily as needed for edema. 03/29/22   [provider]  Glucerna (GLUCERNA) LIQD Take 237 mLs by mouth daily. For wound healing    [provider]  hydrOXYzine (ATARAX/VISTARIL) 10 MG tablet Take 10 mg by mouth every 8 (eight) hours as needed for itching or anxiety.  Patient not taking: Reported on 08/02/2022 11/22/19   [provider]  Nystatin (GERHARDT'S BUTT CREAM) CREA As directed Patient not taking: Reported on 08/02/2022 11/26/21   Murlean Iba, MD  nystatin (MYCOSTATIN/NYSTOP) powder Apply topically 2 (two) times daily. Patient not taking: Reported on 08/02/2022 11/26/21   Murlean Iba, MD    Physical Exam: Vitals:   08/12/22 0213 08/12/22 0500 08/12/22 0535 08/12/22 0800  BP: (!) 145/52  (!) 115/51 (!) 134/47  Pulse: 70  67 73  Resp: '18  18 18  '$ Temp: 98.6 F (37 C)  98.2 F (36.8 C)   TempSrc: Oral  Oral   SpO2: 95%  94% 91%  Weight:  120.8 kg     Physical Exam Vitals and nursing note reviewed.  Constitutional:      General: She is awake. She is not in acute distress.    Appearance: Normal appearance. She is morbidly obese. She is not ill-appearing.  HENT:     Head: Normocephalic.     Nose: No rhinorrhea.     Mouth/Throat:     Mouth: Mucous membranes are moist.  Eyes:     General: No scleral icterus.    Pupils: Pupils are equal, round, and reactive to light.  Neck:     Vascular: No JVD.  Cardiovascular:     Rate and Rhythm: Normal rate and regular rhythm.      Heart sounds: S1 normal and S2 normal.     Comments: Stage II lymphedema. Pulmonary:     Effort: Pulmonary effort is normal.     Breath sounds: Normal breath sounds. No wheezing, rhonchi or rales.  Abdominal:     General: Bowel sounds are normal.     Palpations: Abdomen is soft.  Musculoskeletal:     Cervical back: Neck supple.     Right lower  leg: No edema.     Left lower leg: No edema.  Skin:    General: Skin is warm and dry.  Neurological:     General: No focal deficit present.     Mental Status: She is alert and oriented to person, place, and time.  Psychiatric:        Mood and Affect: Mood normal.        Behavior: Behavior normal. Behavior is cooperative.   Data Reviewed:   There are no new results to review at this time.   Family Communication:  Primary team communication:  Thank you very much for involving [place] in the care of your patient.  Author: Reubin Milan, MD 08/12/2022 2:16 PM  For on call review [email or web address].   This document was prepared using Dragon voice recognition software and may contain some unintended transcription errors.

## 2022-08-12 NOTE — Progress Notes (Signed)
1 Day Post-Op robotic assisted LAR Subjective: No acute issues overnight, elevated CBG's.  Pain controlled.  Objective: Vital signs in last 24 hours: Temp:  [97 F (36.1 C)-98.6 F (37 C)] 98.2 F (36.8 C) (09/07 0535) Pulse Rate:  [50-70] 67 (09/07 0535) Resp:  [6-22] 18 (09/07 0535) BP: (115-162)/(34-61) 115/51 (09/07 0535) SpO2:  [89 %-100 %] 94 % (09/07 0535) Arterial Line BP: (148-176)/(49-60) 160/51 (09/06 1315) Weight:  [120.8 kg] 120.8 kg (09/07 0500)   Intake/Output from previous day: 09/06 0701 - 09/07 0700 In: 2835.7 [P.O.:570; I.V.:2165.7; IV Piggyback:100] Out: 2300 [Urine:2200; Blood:100] Intake/Output this shift: No intake/output data recorded.   General appearance: alert and cooperative GI: soft, nondistended  Incision: no significant drainage  Lab Results:  Recent Labs    08/12/22 0519  WBC 11.5*  HGB 9.3*  HCT 30.8*  PLT 294   BMET Recent Labs    08/12/22 0519  NA 138  K 4.5  CL 106  CO2 23  GLUCOSE 372*  BUN 24*  CREATININE 1.32*  CALCIUM 9.0   PT/INR No results for input(s): "LABPROT", "INR" in the last 72 hours. ABG No results for input(s): "PHART", "HCO3" in the last 72 hours.  Invalid input(s): "PCO2", "PO2"  MEDS, Scheduled  alvimopan  12 mg Oral BID   amLODipine  10 mg Oral Daily   enoxaparin (LOVENOX) injection  40 mg Subcutaneous Q24H   feeding supplement  237 mL Oral BID BM   feeding supplement (GLUCERNA SHAKE)  237 mL Oral Daily   gabapentin  100 mg Oral TID   hydrALAZINE  25 mg Oral Q8H   insulin aspart  0-20 Units Subcutaneous TID WC   insulin aspart  0-5 Units Subcutaneous QHS   insulin glargine-yfgn  28 Units Subcutaneous Daily   levothyroxine  88 mcg Oral QAC breakfast   nystatin   Topical BID   pantoprazole  40 mg Oral Daily   potassium chloride SA  20 mEq Oral Daily   propranolol  80 mg Oral Daily   saccharomyces boulardii  250 mg Oral BID   sertraline  150 mg Oral Daily    Studies/Results: No results  found.  Assessment: s/p Procedure(s): XI ROBOT ASSISTED LOW ANTERIOR RESECTION Patient Active Problem List   Diagnosis Date Noted   Colon cancer ([place]) 08/11/2022   Cancer of sigmoid colon ([place]) 05/12/2022   Diarrhea of presumed infectious origin 72/53/6644   Acute metabolic encephalopathy 03/47/4259   Iron deficiency anemia    Pressure injury of skin 11/24/2021   Open wound of left foot 11/22/2021   Chronic venous stasis 11/22/2021   Leukocytosis 11/22/2021   Thrombocytosis 11/22/2021   Hyperglycemia due to diabetes mellitus ([place]) 11/22/2021   Lactic acidosis 11/22/2021   Hypoalbuminemia due to protein-calorie malnutrition ([place]) 11/22/2021   Elevated liver enzymes 11/22/2021   Mixed hyperlipidemia 11/22/2021   GERD (gastroesophageal reflux disease) 11/22/2021   Chronic diastolic CHF (congestive heart failure) ([place]) 11/22/2021   Diabetic foot infection ([place]) 11/22/2021   Uncontrolled type 2 diabetes mellitus with hyperglycemia, with long-term current use of insulin ([place]) 11/22/2021   Lower extremity cellulitis 11/21/2021   Morbid obesity with BMI of 50.0-59.9, adult ([place]) 01/29/2020   Educated about COVID-19 virus infection 01/29/2020   OSA (obstructive sleep apnea) 12/21/2019   Asthma 56/38/7564   Diastolic dysfunction 33/29/5188   Prolonged QT interval 05/14/2015   Palpitations 07/23/2014   Generalized weakness 07/23/2014   Diabetes mellitus ([place]) 07/22/2014   Essential hypertension 07/22/2014   COPD (chronic  obstructive pulmonary disease) ([place]) 07/22/2014   Depression 07/22/2014    Expected post op course  Plan: [place] lock IVF's PT/OT/TOC consults  Fulls as tolerated  LOS: 1 day     .Rosario Adie, MD [facility], [place]    08/12/2022 7:55 AM

## 2022-08-12 NOTE — Discharge Instructions (Signed)
ABDOMINAL SURGERY: POST OP INSTRUCTIONS  DIET: Follow a light bland diet the first 24 hours after arrival home, such as soup, liquids, crackers, etc.  Be sure to include lots of fluids daily.  Avoid fast food or heavy meals as your are more likely to get nauseated.  Do not eat any uncooked fruits or vegetables for the next 2 weeks as your colon heals. Take your usually prescribed home medications unless otherwise directed. PAIN CONTROL: Pain is best controlled by a usual combination of three different methods TOGETHER: Ice/Heat Over the counter pain medication Prescription pain medication Most patients will experience some swelling and bruising around the incisions.  Ice packs or heating pads (30-60 minutes up to 6 times a day) will help. Use ice for the first few days to help decrease swelling and bruising, then switch to heat to help relax tight/sore spots and speed recovery.  Some people prefer to use ice alone, heat alone, alternating between ice & heat.  Experiment to what works for you.  Swelling and bruising can take several weeks to resolve.   It is helpful to take an over-the-counter pain medication regularly for the first few weeks.  Choose one of the following that works best for you: Naproxen (Aleve, etc)  Two 220mg  tabs twice a day Ibuprofen (Advil, etc) Three 200mg  tabs four times a day (every meal & bedtime) Acetaminophen (Tylenol, etc) 500-650mg  four times a day (every meal & bedtime) A  prescription for pain medication (such as oxycodone, hydrocodone, etc) should be given to you upon discharge.  Take your pain medication as prescribed.  If you are having problems/concerns with the prescription medicine (does not control pain, nausea, vomiting, rash, itching, etc), please call us [phone] to see if we need to switch you to a different pain medicine that will work better for you and/or control your side effect better. If you need a refill on your pain medication, please contact  your pharmacy.  They will contact our office to request authorization. Prescriptions will not be filled after 5 pm or on week-ends. Avoid getting constipated.  Between the surgery and the pain medications, it is common to experience some constipation.  Increasing fluid intake and taking a fiber supplement (such as Metamucil, Citrucel, FiberCon, MiraLax, etc) 1-2 times a day regularly will usually help prevent this problem from occurring.  A mild laxative (prune juice, Milk of Magnesia, MiraLax, etc) should be taken according to package directions if there are no bowel movements after 48 hours.   Watch out for diarrhea.  If you have many loose bowel movements, simplify your diet to bland foods & liquids for a few days.  Stop any stool softeners and decrease your fiber supplement.  Switching to mild anti-diarrheal medications (Kayopectate, Pepto Bismol) can help.  If this worsens or does not improve, please call us. Wash / shower every day.  You may shower over the incision / wound.  Avoid baths until the skin is fully healed.  Continue to shower over incision(s) after the dressing is off. Remove your waterproof bandages 5 days after surgery.  You may leave the incision open to air.  You may replace a dressing/Band-Aid to cover the incision for comfort if you wish. ACTIVITIES as tolerated:   You may resume regular (light) daily activities beginning the next day--such as daily self-care, walking, climbing stairs--gradually increasing activities as tolerated.  If you can walk 30 minutes without difficulty, it is safe to try more intense activity such as jogging,  treadmill, bicycling, low-impact aerobics, swimming, etc. Save the most intensive and strenuous activity for last such as sit-ups, heavy lifting, contact sports, etc  Refrain from any heavy lifting or straining until you are off narcotics for pain control.   DO NOT PUSH THROUGH PAIN.  Let pain be your guide: If it hurts to do something, don't do it.   Pain is your body warning you to avoid that activity for another week until the pain goes down. You may drive when you are no longer taking prescription pain medication, you can comfortably wear a seatbelt, and you can safely maneuver your car and apply brakes. You may have sexual intercourse when it is comfortable.  FOLLOW UP in our office Please call [facility] at [phone] to set up an appointment to see your surgeon in the office for a follow-up appointment approximately 1-2 weeks after your surgery. Make sure that you call for this appointment the day you arrive home to insure a convenient appointment time. 10. IF YOU HAVE DISABILITY OR FAMILY LEAVE FORMS, BRING THEM TO THE OFFICE FOR PROCESSING.  DO NOT GIVE THEM TO YOUR DOCTOR.   WHEN TO CALL us [phone]: Poor pain control Reactions / problems with new medications (rash/itching, nausea, etc)  Fever over 101.5 F (38.5 C) Inability to urinate Nausea and/or vomiting Worsening swelling or bruising Continued bleeding from incision. Increased pain, redness, or drainage from the incision  The clinic staff is available to answer your questions during regular business hours (8:30am-5pm).  Please don't hesitate to call and ask to speak to one of our nurses for clinical concerns.   A surgeon from [facility] is always on call at the hospitals   If you have a medical emergency, go to the nearest emergency room or call 911.    [facility] [address], [address], [place], [place]  [address] ? MAIN: [phone]) [phone] ? TOLL FREE: [phone] ? FAX [phone] [email or web address]    GETTING TO GOOD BOWEL HEALTH.  ######################################################################  EAT Gradually transition to a high fiber diet with a fiber supplement over the next few weeks after discharge.  Start with a pureed / full liquid diet (see below)  WALK Walk an hour a day.  Control your  pain to do that.    HAVE A BOWEL MOVEMENT DAILY Keep your bowels regular to avoid problems.  OK to try a laxative to override constipation.  OK to use an antidairrheal to slow down diarrhea.  Call if not better after 2 tries  CALL IF YOU HAVE PROBLEMS/CONCERNS Call if you are still struggling despite following these instructions. Call if you have concerns not answered by these instructions  ######################################################################   Irregular bowel habits such as constipation and diarrhea can lead to many problems over time.  Having one soft bowel movement a day is the most important way to prevent further problems.  The anorectal canal is designed to handle stretching and feces to safely manage our ability to get rid of solid waste (feces, poop, stool) out of our body.  BUT, hard constipated stools can act like ripping concrete bricks and diarrhea can be a burning fire to this very sensitive area of our body, causing inflamed hemorrhoids, anal fissures, increasing risk is perirectal abscesses, abdominal pain/bloating, an making irritable bowel worse.      The goal: ONE SOFT BOWEL MOVEMENT A DAY!  To have soft, regular bowel movements:  Drink plenty of fluids, consider 4-6 tall glasses  of water a day.   Take plenty of fiber.  Fiber is the undigested part of plant food that passes into the colon, acting s "natures broom" to encourage bowel motility and movement.  Fiber can absorb and hold large amounts of water. This results in a larger, bulkier stool, which is soft and easier to pass. Work gradually over several weeks up to 6 servings a day of fiber (25g a day even more if needed) in the form of: Vegetables -- Root (potatoes, carrots, turnips), leafy green (lettuce, salad greens, celery, spinach), or cooked high residue (cabbage, broccoli, etc) Fruit -- Fresh (unpeeled skin & pulp), Dried (prunes, apricots, cherries, etc ),  or stewed ( applesauce)  Whole grain  breads, pasta, etc (whole wheat)  Bran cereals  Bulking Agents -- This type of water-retaining fiber generally is easily obtained each day by one of the following:  Psyllium bran -- The psyllium plant is remarkable because its ground seeds can retain so much water. This product is available as Metamucil, Konsyl, Effersyllium, Per Diem Fiber, or the less expensive generic preparation in drug and health food stores. Although labeled a laxative, it really is not a laxative.  Methylcellulose -- This is another fiber derived from wood which also retains water. It is available as Citrucel. Polyethylene Glycol - and "artificial" fiber commonly called Miralax or Glycolax.  It is helpful for people with gassy or bloated feelings with regular fiber Flax Seed - a less gassy fiber than psyllium No reading or other relaxing activity while on the toilet. If bowel movements take longer than 5 minutes, you are too constipated AVOID CONSTIPATION.  High fiber and water intake usually takes care of this.  Sometimes a laxative is needed to stimulate more frequent bowel movements, but  Laxatives are not a good long-term solution as it can wear the colon out.  They can help jump-start bowels if constipated, but should be relied on constantly without discussing with your doctor Osmotics (Milk of Magnesia, Fleets phosphosoda, Magnesium citrate, MiraLax, GoLytely) are safer than  Stimulants (Senokot, Castor Oil, Dulcolax, Ex Lax)    Avoid taking laxatives for more than 7 days in a row.  IF SEVERELY CONSTIPATED, try a Bowel Retraining Program: Do not use laxatives.  Eat a diet high in roughage, such as bran cereals and leafy vegetables.  Drink six (6) ounces of prune or apricot juice each morning.  Eat two (2) large servings of stewed fruit each day.  Take one (1) heaping tablespoon of a psyllium-based bulking agent twice a day. Use sugar-free sweetener when possible to avoid excessive calories.  Eat a normal breakfast.   Set aside 15 minutes after breakfast to sit on the toilet, but do not strain to have a bowel movement.  If you do not have a bowel movement by the third day, use an enema and repeat the above steps.   CONTROLLING DIARRHEA  TAKE A FIBER SUPPLEMENT (FiberCon or Benefiner soluble fiber) twice a day - to thicken stools by absorbing excess fluid and retrain the intestines to act more normally.  Slowly increase the dose over a few weeks.  Too much fiber too soon can backfire and cause cramping & bloating.  TAKE AN IRON SUPPLEMENT twice a day to naturally constipate your bowels.  Usually ferrous sulfate 325mg  twice a day)  TAKE ANTI-DIARRHEAL MEDICINES: Loperamide (Imodium) can slow down diarrhea.  Start with two tablets (= 4mg ) first and then try one tablet every 6 hours.  Can go up  to 2 pills four times day (8 pills of 2mg  max) Avoid if you are having fevers or severe pain.  If you are not better or start feeling worse, stop all medicines and call your doctor for advice LoMotil (Diphenoxylate / Atropine) is another medicine that can constipate & slow down bowel moevements Pepto Bismol (bismuth) can gently thicken bowels as well  If diarrhea is worse,: drink plenty of liquids and try simpler foods for a few days to avoid stressing your intestines further. Avoid dairy products (especially milk & ice cream) for a short time.  The intestines often can lose the ability to digest lactose when stressed. Avoid foods that cause gassiness or bloating.  Typical foods include beans and other legumes, cabbage, broccoli, and dairy foods.  Every person has some sensitivity to other foods, so listen to our body and avoid those foods that trigger problems for you.Call your doctor if you are getting worse or not better.  Sometimes further testing (cultures, endoscopy, X-ray studies, bloodwork, etc) may be needed to help diagnose and treat the cause of the diarrhea. Take extra anti-diarrheal medicines (maximum is 8  pills of 2mg  loperamide a day)  TROUBLESHOOTING IRREGULAR BOWELS 1) Avoid extremes of bowel movements (no bad constipation/diarrhea) 2) Miralax 17gm mixed in 8oz. water or juice-daily. May use BID as needed.  3) Gas-x,Phazyme, etc. as needed for gas & bloating.  4) Soft,bland diet. No spicy,greasy,fried foods.  5) Prilosec over-the-counter as needed  6) May hold gluten/wheat products from diet to see if symptoms improve.  7)  May try probiotics (Align, Activa, etc) to help calm the bowels down 7) If symptoms become worse call back immediately.

## 2022-08-12 NOTE — Plan of Care (Signed)
  Problem: Coping: Goal: Ability to adjust to condition or change in health will improve Outcome: Progressing   Problem: Pain Managment: Goal: General experience of comfort will improve Outcome: Progressing   Problem: Safety: Goal: Ability to remain free from injury will improve Outcome: Progressing   

## 2022-08-13 ENCOUNTER — Other Ambulatory Visit: Payer: Self-pay

## 2022-08-13 DIAGNOSIS — C189 Malignant neoplasm of colon, unspecified: Secondary | ICD-10-CM | POA: Diagnosis not present

## 2022-08-13 LAB — GLUCOSE, CAPILLARY
Glucose-Capillary: 118 mg/dL — ABNORMAL HIGH (ref 70–99)
Glucose-Capillary: 160 mg/dL — ABNORMAL HIGH (ref 70–99)
Glucose-Capillary: 304 mg/dL — ABNORMAL HIGH (ref 70–99)
Glucose-Capillary: 323 mg/dL — ABNORMAL HIGH (ref 70–99)

## 2022-08-13 LAB — CBC
HCT: 28.4 % — ABNORMAL LOW (ref 36.0–46.0)
Hemoglobin: 8.8 g/dL — ABNORMAL LOW (ref 12.0–15.0)
MCH: 23.3 pg — ABNORMAL LOW (ref 26.0–34.0)
MCHC: 31 g/dL (ref 30.0–36.0)
MCV: 75.1 fL — ABNORMAL LOW (ref 80.0–100.0)
Platelets: 284 10*3/uL (ref 150–400)
RBC: 3.78 MIL/uL — ABNORMAL LOW (ref 3.87–5.11)
RDW: 23.2 % — ABNORMAL HIGH (ref 11.5–15.5)
WBC: 10.4 10*3/uL (ref 4.0–10.5)
nRBC: 0 % (ref 0.0–0.2)

## 2022-08-13 LAB — BASIC METABOLIC PANEL
Anion gap: 6 (ref 5–15)
BUN: 21 mg/dL (ref 8–23)
CO2: 26 mmol/L (ref 22–32)
Calcium: 8.9 mg/dL (ref 8.9–10.3)
Chloride: 107 mmol/L (ref 98–111)
Creatinine, Ser: 1.28 mg/dL — ABNORMAL HIGH (ref 0.44–1.00)
GFR, Estimated: 48 mL/min — ABNORMAL LOW (ref >60)
Glucose, Bld: 117 mg/dL — ABNORMAL HIGH (ref 70–99)
Potassium: 3.9 mmol/L (ref 3.5–5.1)
Sodium: 139 mmol/L (ref 135–145)

## 2022-08-13 MED ORDER — INSULIN ASPART 100 UNIT/ML IJ SOLN
4.0000 [IU] | Freq: Three times a day (TID) | INTRAMUSCULAR | Status: DC
  Administered 2022-08-13 – 2022-08-14 (×3): 4 [IU] via SUBCUTANEOUS

## 2022-08-13 MED ORDER — TRAMADOL HCL 50 MG PO TABS: 50.0000 mg | ORAL_TABLET | Freq: Four times a day (QID) | ORAL | 0 refills | Status: AC | PRN

## 2022-08-13 MED ORDER — HYDROCORTISONE 1 % EX LOTN
TOPICAL_LOTION | Freq: Three times a day (TID) | CUTANEOUS | Status: DC
  Administered 2022-08-14: 1 via TOPICAL
  Filled 2022-08-13: qty 118

## 2022-08-13 NOTE — Progress Notes (Signed)
2 Days Post-Op robotic assisted LAR Subjective: No acute issues overnight, elevated CBG's.  Pain controlled.  Objective: Vital signs in last 24 hours: Temp:  [98.6 F (37 C)-99.2 F (37.3 C)] 98.6 F (37 C) (09/08 0553) Pulse Rate:  [64-73] 65 (09/08 0553) Resp:  [16-18] 16 (09/08 0553) BP: (128-154)/(47-55) 154/55 (09/08 0553) SpO2:  [91 %-95 %] 95 % (09/08 0553) Weight:  [120.7 kg-120.8 kg] 120.7 kg (09/08 0500)   Intake/Output from previous day: 09/07 0701 - 09/08 0700 In: 3530.6 [P.O.:960; I.V.:1666.9; IV Piggyback:903.8] Out: 2351 [Urine:2350; Stool:1] Intake/Output this shift: No intake/output data recorded.   General appearance: alert and cooperative GI: soft, nondistended  Incision: no significant drainage  Lab Results:  Recent Labs    08/12/22 0519 08/13/22 0452  WBC 11.5* 10.4  HGB 9.3* 8.8*  HCT 30.8* 28.4*  PLT 294 284   BMET Recent Labs    08/12/22 1941 08/13/22 0452  NA 137 139  K 4.1 3.9  CL 106 107  CO2 25 26  GLUCOSE 240* 117*  BUN 25* 21  CREATININE 1.38* 1.28*  CALCIUM 8.9 8.9   PT/INR No results for input(s): "LABPROT", "INR" in the last 72 hours. ABG No results for input(s): "PHART", "HCO3" in the last 72 hours.  Invalid input(s): "PCO2", "PO2"  MEDS, Scheduled  amLODipine  10 mg Oral Daily   Chlorhexidine Gluconate Cloth  6 each Topical Daily   enoxaparin (LOVENOX) injection  40 mg Subcutaneous Q24H   feeding supplement (GLUCERNA SHAKE)  237 mL Oral Daily   gabapentin  100 mg Oral TID   hydrALAZINE  25 mg Oral Q8H   insulin aspart  0-20 Units Subcutaneous TID WC   insulin aspart  0-5 Units Subcutaneous QHS   insulin glargine-yfgn  28 Units Subcutaneous Daily   insulin glargine-yfgn  30 Units Subcutaneous QHS   levothyroxine  88 mcg Oral QAC breakfast   nystatin   Topical BID   pantoprazole  40 mg Oral Daily   potassium chloride SA  20 mEq Oral Daily   propranolol  80 mg Oral Daily   saccharomyces boulardii  250 mg Oral  BID   sertraline  150 mg Oral Daily    Studies/Results: No results found.  Assessment: s/p Procedure(s): XI ROBOT ASSISTED LOW ANTERIOR RESECTION Patient Active Problem List   Diagnosis Date Noted   Colon cancer ([place]) 08/11/2022   Cancer of sigmoid colon ([place]) 05/12/2022   Diarrhea of presumed infectious origin 47/65/4650   Acute metabolic encephalopathy 35/46/5681   Iron deficiency anemia    Pressure injury of skin 11/24/2021   Open wound of left foot 11/22/2021   Chronic venous stasis 11/22/2021   Leukocytosis 11/22/2021   Thrombocytosis 11/22/2021   Hyperglycemia due to diabetes mellitus ([place]) 11/22/2021   Lactic acidosis 11/22/2021   Hypoalbuminemia due to protein-calorie malnutrition ([place]) 11/22/2021   Elevated liver enzymes 11/22/2021   Mixed hyperlipidemia 11/22/2021   GERD (gastroesophageal reflux disease) 11/22/2021   Chronic diastolic CHF (congestive heart failure) ([place]) 11/22/2021   Diabetic foot infection ([place]) 11/22/2021   Uncontrolled type 2 diabetes mellitus with hyperglycemia, with long-term current use of insulin ([place]) 11/22/2021   Lower extremity cellulitis 11/21/2021   Morbid obesity with BMI of 50.0-59.9, adult ([place]) 01/29/2020   Educated about COVID-19 virus infection 01/29/2020   OSA (obstructive sleep apnea) 12/21/2019   Asthma 27/51/7001   Diastolic dysfunction 74/94/4967   Prolonged QT interval 05/14/2015   Palpitations 07/23/2014   Generalized weakness 07/23/2014   Diabetes mellitus (  [place]) 07/22/2014   Essential hypertension 07/22/2014   COPD (chronic obstructive pulmonary disease) ([place]) 07/22/2014   Depression 07/22/2014    Expected post op course  Plan: [place] [facility] IVF's PT/OT/TOC consults  Adv to carb modified diet today  Medicine following for her DM, appreciate assistance  Dispo: SNF/[facility] long-term resident - potentially tomorrow vs Sunday   LOS: 2 days     .Rosario Adie, [facility],  [place]    08/13/2022 7:48 AM

## 2022-08-13 NOTE — Evaluation (Signed)
Physical Therapy Evaluation Patient Details Name: [name] MRN: [id number] DOB: 07/28/1961 Today's Date: 08/13/2022  History of Present Illness  Pt is a 61 yo female s/p robot assisted low anterior resection 9/6 for sigmoid colon cancer. PMH: COPD, diabetes, HTN, CKD stage3  Clinical Impression  Pt admitted with above diagnosis. Pt from [facility] facility, poor historian, reports using w/c to get to restroom and also ambulating 260 ft with RW and therapy at facility. Pt pleasant, follows commands appropriately, unsure of cognitive baseline, presents soiled in bed and notifies therapy when we enter room but denies notifying nursing to go to restroom or be cleaned up once it happened; no family present to verify baseline. Pt needs mod A+2 for supine to sit EOB with cues for log roll to minimize discomfort. Pt powers to stand with min A+2 for safety, using rocking momentum to assist, cues for hand placement with transfers. Pt able to take a few steps over to recliner with RW, min guard for safety, good steadiness with RW and no overt LOB, longer distance deferred due to lunch present.  Pt currently with functional limitations due to the deficits listed below (see PT Problem List). Pt will benefit from skilled PT to increase their independence and safety with mobility to allow discharge to the venue listed below.          Recommendations for follow up therapy are one component of a multi-disciplinary discharge planning process, led by the attending physician.  Recommendations may be updated based on patient status, additional functional criteria and insurance authorization.  Follow Up Recommendations Skilled nursing-short term rehab (<3 hours/day) Can patient physically be transported by private vehicle: Yes    Assistance Recommended at Discharge Frequent or constant Supervision/Assistance  Patient can return home with the following  Assistance with cooking/housework;Assist for  transportation;A lot of help with bathing/dressing/bathroom;A lot of help with walking and/or transfers    Equipment Recommendations None recommended by PT  Recommendations for Other Services       Functional Status Assessment Patient has had a recent decline in their functional status and demonstrates the ability to make significant improvements in function in a reasonable and predictable amount of time.     Precautions / Restrictions Precautions Precautions: Fall Restrictions Weight Bearing Restrictions: No      Mobility  Bed Mobility Overal bed mobility: Needs Assistance Bed Mobility: Supine to Sit  Supine to sit: Mod assist, +2 for physical assistance  General bed mobility comments: cues for log roll and to exhale with mobility, use of bed pad with mod A+2 to upright trunk and bring BLE to EOB    Transfers Overall transfer level: Needs assistance Equipment used: Rolling walker (2 wheels) Transfers: Sit to/from Stand Sit to Stand: Min assist, +2 safety/equipment  General transfer comment: cues for hand placement, pt using rocking momentum to power up, min A to steady with rising and RW shifted anterior to reduce posterior lean    Ambulation/Gait  Assistive device: Rolling walker (2 wheels) Gait Pattern/deviations: Step-to pattern  General Gait Details: min guard with short, slow steps at bedside, longer distance deferred due to lunch present  Stairs            Wheelchair Mobility    Modified Rankin (Stroke Patients Only)       Balance Overall balance assessment: Mild deficits observed, not formally tested       Pertinent Vitals/Pain Pain Assessment Pain Assessment: Faces Faces Pain Scale: Hurts little more Pain Location:  abdomen with supine to sit Pain Descriptors / Indicators: Discomfort, Sore, Tender Pain Intervention(s): Limited activity within patient's tolerance, Monitored during session, Repositioned    Home Living Family/patient expects to  be discharged to:: Skilled nursing facility    Prior Function Prior Level of Function : Needs assist;Patient poor historian/Family not available  Mobility Comments: pt poor historian, reports using w/c to mobilize around facility and ambulates up to 260 ft with therapy and RW       Hand Dominance   Dominant Hand: Right    Extremity/Trunk Assessment   Upper Extremity Assessment Upper Extremity Assessment: Defer to OT evaluation    Lower Extremity Assessment Lower Extremity Assessment: Generalized weakness;RLE deficits/detail;LLE deficits/detail RLE Deficits / Details: AROM WNL, strength 3+5 throughout except 3/5 ankle dorsiflexion RLE Sensation: history of peripheral neuropathy (pt reports) LLE Deficits / Details: AROM WNL, strength 3+/5 throughout LLE Sensation: history of peripheral neuropathy (pt reports)    Cervical / Trunk Assessment Cervical / Trunk Assessment: Normal  Communication   Communication: No difficulties  Cognition Arousal/Alertness: Awake/alert Behavior During Therapy: WFL for tasks assessed/performed Overall Cognitive Status: No family/caregiver present to determine baseline cognitive functioning  General Comments: pt pleasant, follows commands appropriately, varying reports of baseline status. Pt is aware she is soiled, but didn't call for assist to restroom or to be cleaned up.        General Comments      Exercises     Assessment/Plan    PT Assessment Patient needs continued PT services  PT Problem List Decreased strength;Decreased activity tolerance;Decreased balance;Decreased knowledge of use of DME;Decreased safety awareness;Pain;Decreased skin integrity;Obesity;Impaired sensation       PT Treatment Interventions DME instruction;Gait training;Functional mobility training;Therapeutic activities;Therapeutic exercise;Balance training;Patient/family education    PT Goals (Current goals can be found in the Care Plan section)  Acute Rehab PT  Goals Patient Stated Goal: get out of bed PT Goal Formulation: With patient Time For Goal Achievement: 08/27/22 Potential to Achieve Goals: Good    Frequency Min 2X/week     Co-evaluation               AM-PAC PT "6 Clicks" Mobility  Outcome Measure Help needed turning from your back to your side while in a flat bed without using bedrails?: A Lot Help needed moving from lying on your back to sitting on the side of a flat bed without using bedrails?: A Lot Help needed moving to and from a bed to a chair (including a wheelchair)?: A Little Help needed standing up from a chair using your arms (e.g., wheelchair or bedside chair)?: A Little Help needed to walk in hospital room?: A Lot Help needed climbing 3-5 steps with a railing? : A Lot 6 Click Score: 14    End of Session Equipment Utilized During Treatment: Gait belt Activity Tolerance: Patient tolerated treatment well Patient left: in chair;with call bell/phone within reach;with chair alarm set Nurse Communication: Mobility status;Other (comment) (skin rash) PT Visit Diagnosis: Other abnormalities of gait and mobility (R26.89);Muscle weakness (generalized) (M62.81)    Time: 1610-9604 PT Time Calculation (min) (ACUTE ONLY): 21 min   Charges:   PT Evaluation $PT Eval Low Complexity: 1 Low           Tori Anneth Brunell PT, DPT 08/13/22, 12:35 PM

## 2022-08-13 NOTE — Progress Notes (Signed)
Dr Maylene Roes notified that pt has a rash/spots on her lower back, both her buttocks (exterior, no interior buttocks) and also on the back of her thighs.

## 2022-08-13 NOTE — Inpatient Diabetes Management (Addendum)
Inpatient Diabetes Program Recommendations  AACE/ADA: New Consensus Statement on Inpatient Glycemic Control (2015)  Target Ranges:  Prepandial:   less than 140 mg/dL      Peak postprandial:   less than 180 mg/dL (1-2 hours)      Critically ill patients:  140 - 180 mg/dL   Lab Results  Component Value Date   GLUCAP 118 (H) 08/13/2022   HGBA1C 7.2 (H) 08/03/2022    Review of Glycemic Control  Diabetes history: DM2 Outpatient Diabetes medications: Admelog 4-8 units TID, Lantus 30 units BID Current orders for Inpatient glycemic control: Semglee 28 in am and 30 QHS, Novolog 0-20 units TID with meals and 0-5 HS  HgbA1C - 7.2% CBG 118 this am.  Inpatient Diabetes Program Recommendations:    Consider adding Novolog 4 units TID with meals if eating > 50%.  Will speak with pt regarding her diabetes control and insulin doses later this am.  Continue to follow.  Thank you. Lorenda Peck, RD, LDN, CDCES Inpatient Diabetes Coordinator [phone]   Addendum: Spoke with pt at bedside regarding her diabetes meds at the SNF. Pt states she "was supposed to be on Lantus 30 BID and Humalog s/s", but I don't know what they give me." States she eats healthy because SNF makes her. Admits to hypoglycemia on occasion. Follows up with PCP for diabetes management. Discussed HgbA1C of 7.2%. Encouraged pt to make healthy choices while inpatient. Discussed with RN.   RV

## 2022-08-13 NOTE — Progress Notes (Addendum)
PROGRESS NOTE    [name]  UVO:[id number] DOB: 09-12-1961 DOA: 08/11/2022 PCP: Bridget Hartshorn, NP     Brief Narrative:  [name] is a 62 y.o. female with past medical history of anemia, osteoarthritis, asthma, COPD, depression, type 2 diabetes, diastolic dysfunction, dyspnea, generalized weakness, hypertension, hyperlipidemia, hypothyroidism, class III obesity with a BMI of 50.32 kg/m at the moment, palpitations, history of pneumonia, PONV, prolonged QT interval, sleep apnea who underwent robotic assisted LAR 9/6 and we are evaluating due to hyperglycemia.    New events last 24 hours / Subjective: Patient is in good spirits, has no complaints.  Her blood sugar this morning was 117.   Assessment & Plan:   Principal Problem:   Colon cancer ([place]) Active Problems:   Essential hypertension   COPD (chronic obstructive pulmonary disease) (HCC)   Depression   Hyperglycemia due to diabetes mellitus (HCC)   Mixed hyperlipidemia   GERD (gastroesophageal reflux disease)   Chronic diastolic CHF (congestive heart failure) (HCC)   Iron deficiency anemia   Colon cancer status post robotic resection -Per primary team  Hyperglycemia in setting of diabetes mellitus, insulin-dependent -Continue long-acting insulin twice daily, added mealtime NovoLog as well as sliding scale insulin  Hypertension -Amlodipine, hydralazine, propranolol  COPD -Without exacerbation  Mood disorder -Sertraline, hydroxyzine as needed  GERD -PPI  Chronic diastolic heart failure -Without exacerbation  CKD stage 3a -Stable   Hypothyroidism -Synthroid    Antimicrobials:  Anti-infectives (From admission, onward)    Start     Dose/Rate Route Frequency Ordered Stop   08/11/22 1515  fosfomycin (MONUROL) packet 3 g  Status:  Discontinued        3 g Oral Every 3 DAYS 08/11/22 1424 08/11/22 1453   08/11/22 0630  cefoTEtan (CEFOTAN) 2 g in sodium chloride 0.9 % 100 mL IVPB        2  g 200 mL/hr over 30 Minutes Intravenous On call to O.R. 08/11/22 0622 08/11/22 0910        Objective: Vitals:   08/12/22 2142 08/12/22 2300 08/13/22 0500 08/13/22 0553  BP: (!) 128/53   (!) 154/55  Pulse: 64   65  Resp: 18   16  Temp: 99 F (37.2 C)   98.6 F (37 C)  TempSrc: Oral   Oral  SpO2: 92%   95%  Weight:  120.8 kg 120.7 kg   Height:  '5\' 1"'$  (1.549 m)      Intake/Output Summary (Last 24 hours) at 08/13/2022 1033 Last data filed at 08/13/2022 0600 Gross per 24 hour  Intake 3290.63 ml  Output 1651 ml  Net 1639.63 ml   Filed Weights   08/12/22 0500 08/12/22 2300 08/13/22 0500  Weight: 120.8 kg 120.8 kg 120.7 kg    Examination:  General exam: Appears calm and comfortable  Respiratory system: Clear to auscultation. Respiratory effort normal. No respiratory distress. No conversational dyspnea.  Cardiovascular system: S1 & S2 heard, RRR. No murmurs. No pedal edema. Gastrointestinal system: Abdomen is nondistended, soft and nontender. Normal bowel sounds heard. Central nervous system: Alert and oriented. No focal neurological deficits. Speech clear.  Extremities: Symmetric in appearance  Skin: No rashes, lesions or ulcers on exposed skin  Psychiatry: Judgement and insight appear normal. Mood & affect appropriate.   Data Reviewed: I have personally reviewed following labs and imaging studies  CBC: Recent Labs  Lab 08/12/22 0519 08/13/22 0452  WBC 11.5* 10.4  HGB 9.3* 8.8*  HCT 30.8* 28.4*  MCV  74.9* 75.1*  PLT 294 209   Basic Metabolic Panel: Recent Labs  Lab 08/12/22 0519 08/12/22 1203 08/12/22 1941 08/13/22 0452  NA 138 135 137 139  K 4.5 4.7 4.1 3.9  CL 106 104 106 107  CO2 23 20* 25 26  GLUCOSE 372* 556* 240* 117*  BUN 24* 26* 25* 21  CREATININE 1.32* 1.41* 1.38* 1.28*  CALCIUM 9.0 9.0 8.9 8.9   GFR: Estimated Creatinine Clearance: 56.1 mL/min (A) (by C-G formula based on SCr of 1.28 mg/dL (H)). Liver Function Tests: No results for input(s):  "AST", "ALT", "ALKPHOS", "BILITOT", "PROT", "ALBUMIN" in the last 168 hours. No results for input(s): "LIPASE", "AMYLASE" in the last 168 hours. No results for input(s): "AMMONIA" in the last 168 hours. Coagulation Profile: No results for input(s): "INR", "PROTIME" in the last 168 hours. Cardiac Enzymes: No results for input(s): "CKTOTAL", "CKMB", "CKMBINDEX", "TROPONINI" in the last 168 hours. BNP (last 3 results) No results for input(s): "PROBNP" in the last 8760 hours. HbA1C: No results for input(s): "HGBA1C" in the last 72 hours. CBG: Recent Labs  Lab 08/12/22 1259 08/12/22 1505 08/12/22 1643 08/12/22 2144 08/13/22 0741  GLUCAP 506* 443* 362* 174* 118*   Lipid Profile: No results for input(s): "CHOL", "HDL", "LDLCALC", "TRIG", "CHOLHDL", "LDLDIRECT" in the last 72 hours. Thyroid Function Tests: No results for input(s): "TSH", "T4TOTAL", "FREET4", "T3FREE", "THYROIDAB" in the last 72 hours. Anemia Panel: No results for input(s): "VITAMINB12", "FOLATE", "FERRITIN", "TIBC", "IRON", "RETICCTPCT" in the last 72 hours. Sepsis Labs: No results for input(s): "PROCALCITON", "LATICACIDVEN" in the last 168 hours.  No results found for this or any previous visit (from the past 240 hour(s)).    Radiology Studies: No results found.    Scheduled Meds:  amLODipine  10 mg Oral Daily   enoxaparin (LOVENOX) injection  40 mg Subcutaneous Q24H   feeding supplement (GLUCERNA SHAKE)  237 mL Oral Daily   gabapentin  100 mg Oral TID   hydrALAZINE  25 mg Oral Q8H   insulin aspart  0-20 Units Subcutaneous TID WC   insulin aspart  0-5 Units Subcutaneous QHS   insulin aspart  4 Units Subcutaneous TID WC   insulin glargine-yfgn  28 Units Subcutaneous Daily   insulin glargine-yfgn  30 Units Subcutaneous QHS   levothyroxine  88 mcg Oral QAC breakfast   nystatin   Topical BID   pantoprazole  40 mg Oral Daily   potassium chloride SA  20 mEq Oral Daily   propranolol  80 mg Oral Daily    saccharomyces boulardii  250 mg Oral BID   sertraline  150 mg Oral Daily   Continuous Infusions:   LOS: 2 days     Dessa Phi, DO [facility] 08/13/2022, 10:33 AM   Available via Epic secure chat 7am-7pm After these hours, please refer to coverage provider listed on [email or web address]

## 2022-08-14 DIAGNOSIS — C189 Malignant neoplasm of colon, unspecified: Secondary | ICD-10-CM | POA: Diagnosis not present

## 2022-08-14 LAB — BASIC METABOLIC PANEL
Anion gap: 5 (ref 5–15)
BUN: 17 mg/dL (ref 8–23)
CO2: 27 mmol/L (ref 22–32)
Calcium: 9.1 mg/dL (ref 8.9–10.3)
Chloride: 107 mmol/L (ref 98–111)
Creatinine, Ser: 1.25 mg/dL — ABNORMAL HIGH (ref 0.44–1.00)
GFR, Estimated: 49 mL/min — ABNORMAL LOW (ref >60)
Glucose, Bld: 185 mg/dL — ABNORMAL HIGH (ref 70–99)
Potassium: 3.4 mmol/L — ABNORMAL LOW (ref 3.5–5.1)
Sodium: 139 mmol/L (ref 135–145)

## 2022-08-14 LAB — CBC
HCT: 30.1 % — ABNORMAL LOW (ref 36.0–46.0)
Hemoglobin: 9.1 g/dL — ABNORMAL LOW (ref 12.0–15.0)
MCH: 22.6 pg — ABNORMAL LOW (ref 26.0–34.0)
MCHC: 30.2 g/dL (ref 30.0–36.0)
MCV: 74.7 fL — ABNORMAL LOW (ref 80.0–100.0)
Platelets: 306 10*3/uL (ref 150–400)
RBC: 4.03 MIL/uL (ref 3.87–5.11)
RDW: 23.8 % — ABNORMAL HIGH (ref 11.5–15.5)
WBC: 9.8 10*3/uL (ref 4.0–10.5)
nRBC: 0 % (ref 0.0–0.2)

## 2022-08-14 LAB — GLUCOSE, CAPILLARY: Glucose-Capillary: 182 mg/dL — ABNORMAL HIGH (ref 70–99)

## 2022-08-14 MED ORDER — POTASSIUM CHLORIDE CRYS ER 20 MEQ PO TBCR
40.0000 meq | EXTENDED_RELEASE_TABLET | Freq: Once | ORAL | Status: AC
Start: 2022-08-14 — End: 2022-08-14
  Administered 2022-08-14: 40 meq via ORAL
  Filled 2022-08-14: qty 2

## 2022-08-14 MED ORDER — INSULIN GLARGINE-YFGN 100 UNIT/ML ~~LOC~~ SOLN: 28.0000 [IU] | Freq: Every day | SUBCUTANEOUS | Status: DC

## 2022-08-14 NOTE — TOC Transition Note (Addendum)
Transition of Care [facility]) - CM/SW Discharge Note   Patient Details  Name: [name] MRN: [id number] Date of Birth: February 16, 1961  [facility] CM/SW Contact:  Kimber Relic, LCSW Phone Number: 08/14/2022, 10:38 AM   Clinical Narrative:    Pt discharging to SNF- [facility]. Report #: [id number], room 116. Family to transport pt. PTAR canceled.    Final next level of care: Skilled Nursing Facility Barriers to Discharge: No Barriers Identified   Patient Goals and CMS Choice Patient states their goals for this hospitalization and ongoing recovery are:: Return to SNF.   Choice offered to / list presented to : NA  Discharge Placement   Existing PASRR number confirmed : 08/14/22          Patient chooses bed at: [facility]  Name of family member notified: Edsel Durkee Patient and family notified of of transfer: 08/14/22  Discharge Plan and Services                                     Social Determinants of Health (SDOH) Interventions     Readmission Risk Interventions    12/17/2021   11:29 AM 11/23/2021    2:32 PM  Readmission Risk Prevention Plan  Transportation Screening Complete Complete  Home Care Screening Complete   Medication Review (RN CM) Complete Complete

## 2022-08-14 NOTE — Progress Notes (Signed)
Attempted to call report to facility receiving pt but go no answer on main telephone X 2. Discharge packet given to pt's husband to deliver to facility, pt d/c via w/c into husband's care, to transport via car to SNF. Pt left will all belongings in stable condition.

## 2022-08-14 NOTE — Discharge Summary (Addendum)
Physician Discharge Summary    Patient ID: [name] MRN: [id number] DOB/AGE: [birth year]  61 y.o.  Patient Care Team: Bridget Hartshorn, NP as PCP - General (Adult Health Nurse Practitioner) Danie Binder, MD (Inactive) as Consulting Physician (Gastroenterology) Derek Jack, MD as Medical Oncologist (Medical Oncology) Brien Mates, RN as Oncology Nurse Navigator (Medical Oncology)  Admit date: 08/11/2022  Discharge date: 08/14/2022  Hospital Stay = 3 days    Discharge Diagnoses:  Principal Problem:   Colon cancer [facility]) Active Problems:   Essential hypertension   COPD (chronic obstructive pulmonary disease) ([place])   Depression   Hyperglycemia due to diabetes mellitus ([place])   Mixed hyperlipidemia   GERD (gastroesophageal reflux disease)   Chronic diastolic CHF (congestive heart failure) ([place])   Iron deficiency anemia   3 Days Post-Op  08/11/2022  POST-OPERATIVE DIAGNOSIS:  SIGMOID COLON CANCER   PROCEDURE:  XI ROBOT ASSISTED LOW ANTERIOR RESECTION   Surgeon(s):  Leighton Ruff, MD  OR FINDINGS:    Patient had mass noted at rectosigmoid junction   No obvious metastatic disease on visceral parietal peritoneum or liver.  Nodular, enlarged liver   The anastomosis rests 10 cm from the anal verge by rigid proctoscopy.  FINAL MICROSCOPIC DIAGNOSIS:   A. COLON, RECTOSIDMOID, RESECTION:  -  Invasive well to moderately differentiated adenocarcinoma with  invasion through submucosa, muscularis propria and into  perirectal/subserosal soft tissue  -  Positive for high-grade tumor budding; negative for lymphovascular  and perineural invasion  -  Margins negative  -  14 lymph nodes, negative for malignancy (0/14)  pT3 pN0 pMX   B. FINAL DISTAL MARGIN:  -  Colon margins with evidence of tattoo type pigment otherwise viable  and negative for dysplasia/malignancy.   ONCOLOGY TABLE:   Regional Lymph Nodes:       Number of Lymph Nodes with Tumor:  0       Number of Lymph Nodes Examined: 14  Tumor Deposits: Not identified  Distant Metastasis:       Distant Site(s) Involved: N/A  Pathologic Stage Classification (pTNM, AJCC 8th Edition): pT3, pN0  Ancillary Studies: MMR/MSI pending; to be reported in an addendum  Representative Tumor Block: A3 and A7  Comments: [None]    Consults: Physical Therapy, Occupational Therapy, Case Management / Social Work, and Internal Medicine (Hospitalist)  Hospital Course:   The patient underwent the surgery above.  Postoperatively, the patient advanced to a solid diet.  Patient did have some significant hyperglycemia.  Medicine was consulted.  Insulin regimen adjusted.  We more rigidly enforced a diabetic/low-carb diet.  Dr. Jeannine Kitten internal medicine did adjust diabetic regimen at discharge.  Pain and other symptoms were treated aggressively.  Pathology came back consistent with T3N0 tumor.  By the time of discharge, the patient was eating food, having flatus.  Patient still somewhat bedridden given her morbid obesity deconditioned state and history of stroke but was in good spirits.  Pain was well-controlled on an oral medications.  Based on meeting discharge criteria and continuing to recover, I felt it was safe for the patient to be discharged from the hospital to further recover with close followup.  Postoperative recommendations were discussed in detail.  They are written as well.  Discharged Condition: stable  Discharge Exam: Blood pressure (!) 149/55, pulse 66, temperature 98.6 F (37 C), temperature source Oral, resp. rate 18, height 5' 1" (1.549 m), weight 120.9 kg, last menstrual period 08/06/2012, SpO2 94 %.  General: Pt awake/alert/oriented x4  in No acute distress Eyes: PERRL, normal EOM.  Sclera clear.  No icterus Neuro: CN II-XII intact w/o focal sensory/motor deficits. Lymph: No head/neck/groin lymphadenopathy Psych:  No delerium/psychosis/paranoia HENT: Normocephalic, Mucus membranes  moist.  No thrush Neck: Supple, No tracheal deviation Chest:  No chest wall pain w good excursion CV:  Pulses intact.  Regular rhythm MS: Normal AROM mjr joints.  No obvious deformity Abdomen: Soft.  Nondistended.  Nontender.  Morbidly obese with panniculus.  Minimal erythema under pannicular fold with antifungal in place.  Incisions clean dry intact with normal healing ridges.  No evidence of peritonitis.  No incarcerated hernias. Ext:  SCDs BLE.  No mjr edema.  No cyanosis Skin: No petechiae / purpura   Disposition:    Follow-up Information     Leighton Ruff, MD Follow up.   Specialties: General Surgery, Colon and Rectal Surgery Contact information: [place] [address] [phone]                 Discharge disposition: 03-Skilled [place]       Discharge Instructions     Call MD for:   Complete by: As directed    FEVER > 101.5 F  (temperatures < 101.5 F are not significant)   Call MD for:  extreme fatigue   Complete by: As directed    Call MD for:  persistant dizziness or light-headedness   Complete by: As directed    Call MD for:  persistant nausea and vomiting   Complete by: As directed    Call MD for:  redness, tenderness, or signs of infection (pain, swelling, redness, odor or green/yellow discharge around incision site)   Complete by: As directed    Call MD for:  severe uncontrolled pain   Complete by: As directed    Diet - low sodium heart healthy   Complete by: As directed    Start with a bland diet such as soups, liquids, starchy foods, low fat foods, etc. the first few days at home. Gradually advance to a solid, low-fat, high fiber diet by the end of the first week at home.   Add a fiber supplement to your diet (Metamucil, etc) If you feel full, bloated, or constipated, stay on a full liquid or pureed/blenderized diet for a few days until you feel better and are no longer constipated.   Discharge instructions    Complete by: As directed    See Discharge Instructions If you are not getting better after two weeks or are noticing you are getting worse, contact our office [phone] for further advice.  We may need to adjust your medications, re-evaluate you in the office, send you to the emergency room, or see what other things we can do to help. The clinic staff is available to answer your questions during regular business hours (8:30am-5pm).  Please don't hesitate to call and ask to speak to one of our nurses for clinical concerns.    A surgeon from [facility] is always on call at the hospitals 24 hours/day If you have a medical emergency, go to the nearest emergency room or call 911.   Discharge wound care:   Complete by: As directed    It is good for closed incisions and even open wounds to be washed every day.  Shower every day.  Short baths are fine.  Wash the incisions and wounds clean with soap & water.    You may leave closed incisions  open to air if it is dry.   You may cover the incision with clean gauze & replace it after your daily shower for comfort.  DERMABOND:  You have purple skin glue (Dermabond) on your incision(s).  Leave them in place, and they will fall off on their own like a scab in 2-3 weeks.  You may trim any edges that curl up with clean scissors.   Driving Restrictions   Complete by: As directed    You may drive when: - you are no longer taking narcotic prescription pain medication - you can comfortably wear a seatbelt - you can safely make sudden turns/stops without pain.   Increase activity slowly   Complete by: As directed    Start light daily activities --- self-care, walking, climbing stairs- beginning the day after surgery.  Gradually increase activities as tolerated.  Control your pain to be active.  Stop when you are tired.  Ideally, walk several times a day, eventually an hour a day.   Most people are back to most day-to-day activities in a few weeks.   It takes 4-6 weeks to get back to unrestricted, intense activity. If you can walk 30 minutes without difficulty, it is safe to try more intense activity such as jogging, treadmill, bicycling, low-impact aerobics, swimming, etc. Save the most intensive and strenuous activity for last (Usually 4-8 weeks after surgery) such as sit-ups, heavy lifting, contact sports, etc.  Refrain from any intense heavy lifting or straining until you are off narcotics for pain control.  You will have off days, but things should improve week-by-week. DO NOT PUSH THROUGH PAIN.  Let pain be your guide: If it hurts to do something, don't do it.   Lifting restrictions   Complete by: As directed    If you can walk 30 minutes without difficulty, it is safe to try more intense activity such as jogging, treadmill, bicycling, low-impact aerobics, swimming, etc. Save the most intensive and strenuous activity for last (Usually 4-8 weeks after surgery) such as sit-ups, heavy lifting, contact sports, etc.   Refrain from any intense heavy lifting or straining until you are off narcotics for pain control.  You will have off days, but things should improve week-by-week. DO NOT PUSH THROUGH PAIN.  Let pain be your guide: If it hurts to do something, don't do it.  Pain is your body warning you to avoid that activity for another week until the pain goes down.   May shower / Bathe   Complete by: As directed    May walk up steps   Complete by: As directed    Sexual Activity Restrictions   Complete by: As directed    You may have sexual intercourse when it is comfortable. If it hurts to do something, stop.       Allergies as of 08/14/2022       Reactions   Carvedilol Palpitations   Benicar [olmesartan] Swelling   Codeine Other (See Comments)   Confusion    Sulfa Antibiotics Swelling   Whole face swells   Trulicity [dulaglutide] Diarrhea        Medication List     TAKE these medications    Admelog 100 UNIT/ML  injection Generic drug: insulin lispro Inject 4-8 Units into the skin 3 (three) times daily with meals. If blood glucose is 200-250= 4 units, 250-300= 5 units, 301-350= 6 units, 351-400= 8 units, > 400 notify MD   albuterol 108 (90 Base) MCG/ACT inhaler Commonly known as: VENTOLIN  HFA Inhale 2 puffs into the lungs every 6 (six) hours as needed for wheezing or shortness of breath.   alum & mag hydroxide-simeth 200-200-20 MG/5ML suspension Commonly known as: MAALOX/MYLANTA Take 30 mLs by mouth every 2 (two) hours as needed for indigestion or heartburn. Do not exceed 4 doses in 24 hours   amLODipine 10 MG tablet Commonly known as: NORVASC Take 10 mg by mouth daily.   ascorbic acid 500 MG tablet Commonly known as: VITAMIN C Take 500 mg by mouth daily.   atorvastatin 10 MG tablet Commonly known as: LIPITOR Take 10 mg by mouth at bedtime.   Calmoseptine 0.44-20.6 % Oint Generic drug: Menthol-Zinc Oxide Apply 1 Application topically as needed (preservation/protection after incontinent care).   eucerin lotion Apply 1 Application topically every 12 (twelve) hours as needed for dry skin.   ferrous sulfate 325 (65 FE) MG tablet Take 325 mg by mouth daily.   fosfomycin 3 g Pack Commonly known as: MONUROL Take 3 g by mouth every 3 (three) days.   furosemide 20 MG tablet Commonly known as: LASIX Take 20 mg by mouth daily as needed for edema.   gabapentin 100 MG capsule Commonly known as: NEURONTIN Take 1 capsule (100 mg total) by mouth 3 (three) times daily.   Gerhardt's butt cream Crea As directed   Glucerna Liqd Take 237 mLs by mouth daily. For wound healing   hydrALAZINE 25 MG tablet Commonly known as: APRESOLINE Take 1 tablet (25 mg total) by mouth every 8 (eight) hours.   hydrOXYzine 10 MG tablet Commonly known as: ATARAX Take 10 mg by mouth every 8 (eight) hours as needed for itching or anxiety.   insulin glargine-yfgn 100 UNIT/ML injection Commonly known as:  SEMGLEE Inject 0.28 mLs (28 Units total) into the skin daily.   Lantus 100 UNIT/ML injection Generic drug: insulin glargine Inject 30 Units into the skin at bedtime.   levothyroxine 88 MCG tablet Commonly known as: SYNTHROID Take 88 mcg by mouth daily before breakfast.   loperamide 2 MG tablet Commonly known as: IMODIUM A-D Take 2 mg by mouth as needed for diarrhea or loose stools. Do not exceed 4 per day   magnesium oxide 400 (240 Mg) MG tablet Commonly known as: MAG-OX Take 400 mg by mouth daily.   nystatin powder Commonly known as: MYCOSTATIN/NYSTOP Apply topically 2 (two) times daily.   omeprazole 20 MG capsule Commonly known as: PRILOSEC Take 20 mg by mouth in the morning and at bedtime.   potassium chloride SA 20 MEQ tablet Commonly known as: KLOR-CON M Take 20 mEq by mouth daily.   Procrit 4000 UNIT/ML injection Generic drug: epoetin alfa Inject 4,000 Units into the skin every Monday, Wednesday, and Friday. Hold if hemoglobin is > 10   propranolol 80 MG tablet Commonly known as: INDERAL Take 80 mg by mouth daily.   sertraline 50 MG tablet Commonly known as: ZOLOFT Take 3 tablets (150 mg total) by mouth daily.   traMADol 50 MG tablet Commonly known as: Ultram Take 1 tablet (50 mg total) by mouth every 6 (six) hours as needed for up to 5 days.   Vitamin D 50 MCG (2000 UT) Caps Take 2,000 Units by mouth daily.               Discharge Care Instructions  (From admission, onward)           Start     Ordered   08/14/22 0000  Discharge wound care:  Comments: It is good for closed incisions and even open wounds to be washed every day.  Shower every day.  Short baths are fine.  Wash the incisions and wounds clean with soap & water.    You may leave closed incisions open to air if it is dry.   You may cover the incision with clean gauze & replace it after your daily shower for comfort.  DERMABOND:  You have purple skin glue (Dermabond) on your  incision(s).  Leave them in place, and they will fall off on their own like a scab in 2-3 weeks.  You may trim any edges that curl up with clean scissors.   08/14/22 0936            Significant Diagnostic Studies:  SURGICAL PATHOLOGY  CASE: [id number]  PATIENT: [facility]  Surgical Pathology Report      Clinical History: Colon cancer (crm)      FINAL MICROSCOPIC DIAGNOSIS:   A. COLON, RECTOSIDMOID, RESECTION:  -  Invasive well to moderately differentiated adenocarcinoma with  invasion through submucosa, muscularis propria and into  perirectal/subserosal soft tissue  -  Positive for high-grade tumor budding; negative for lymphovascular  and perineural invasion  -  Margins negative  -  14 lymph nodes, negative for malignancy (0/14)  pT3 pN0 pMX   B. FINAL DISTAL MARGIN:  -  Colon margins with evidence of tattoo type pigment otherwise viable  and negative for dysplasia/malignancy.   ONCOLOGY TABLE:    COLON AND RECTUM, CARCINOMA:  Resection, Including Transanal Disk  Excision of Rectal Neoplasms   Procedure: Low anterior resection  Tumor Site: Distal end of the rectosigmoid resection  Tumor Size: 5.5 x 5.0 x 1.7 cm  Macroscopic Tumor Perforation: Not identified  Macroscopic Evaluation of Mesorectum (required for rectal cancer):  Complete  Histologic Type: Adenocarcinoma  Histologic Grade: Well to moderately differentiated with focal  high-grade tumor budding  Multiple Primary Sites: N/A  Tumor Extension: Extends into perirectal/subserosal soft tissue.  Lymphovascular Invasion: Not identified  Perineural Invasion: Not identified  Treatment Effect: No known presurgical therapy  Margins:       Margin Status for Invasive Carcinoma: All margins negative for  invasive carcinoma       Distance from Invasive Carcinoma to Radial (Circumferential) Margin  (required for rectal            tumors): 5 cm       Distance from Invasive Carcinoma to Closest  Mucosal Margin  (relevant and required only for            transanal disc excisions): N/A; not a transanal disc excision       Margin Status for Non-Invasive Tumor: All margins negative  Regional Lymph Nodes:       Number of Lymph Nodes with Tumor: 0       Number of Lymph Nodes Examined: 14  Tumor Deposits: Not identified  Distant Metastasis:       Distant Site(s) Involved: N/A  Pathologic Stage Classification (pTNM, AJCC 8th Edition): pT3, pN0  Ancillary Studies: MMR/MSI pending; to be reported in an addendum  Representative Tumor Block: A3 and A7  Comments: [None]   (v4.2.0.1)    Miranda Frese DESCRIPTION:   Specimen: Rectosigmoid colon, received fresh.  The open end is  clinically identified as proximal.  Specimen integrity: Intact  Specimen length: 24 cm  Mesorectal intactness: Intact  Tumor location: Tumor is located at the distal end of the segment.  Tumor size: The tumor  consists of a sessile, ulcerated, tan-red  circumferential mass measuring 5.5 cm in length and 5 cm in  circumference.  The tumor measures 1.7 cm in maximum thickness.  Percent of bowel circumference involved: 100%  Tumor distance to margins:                       Proximal: 18 cm                       Distal: 0.5 cm                       Radial (posterior ascending, posterior descending;  lateral and posterior mid-rectum; and entire lower 1/3 rectum): 5 cm  Macroscopic extent of tumor invasion: The tumor extends through the wall  into the surrounding soft tissue.  Total presumed lymph nodes: There are 19 tan-yellow ovoid nodules  grossly consistent with lymph nodes measuring 0.3 to 1.1 cm in greatest  dimension.  Extramural satellite tumor nodules: Not grossly identified.  Mucosal polyp(s): Not grossly identified.  Additional findings: The uninvolved mucosa is glistening and tan.  Block summary:  11 blocks submitted  1 = proximal margin  2 = distal margin  3 = tumor with inked serosa  4 = tumor with  proximal uninvolved mucosa transition  5 = tumor with distal uninvolved mucosa transition  6, 7 = tumor with deep extension  8-10 = 18 whole lymph nodes  11 = 1 sectioned lymph node   B: Received fresh is a portion of colon measuring 1.5 cm in length and  1.5 cm in diameter.  The mucosa is glistening and tan.  There are  staples present at one end.  Sections are submitted in 1 cassette.  (GRP  08/12/2022)      Final Diagnosis performed by Tilford Pillar DO.   Electronically signed  08/13/2022  Technical and / or Professional components performed at [facility], [place] [address] [place], [place] [address].   Immunohistochemistry Technical component (if applicable) was performed  at [facility]. [address] [place],  [place], [place] [address].   IMMUNOHISTOCHEMISTRY DISCLAIMER (if applicable):  Some of these immunohistochemical stains may have been developed and the  performance characteristics determine by [facility]. Some  may not have been cleared or approved by the U.S. Food and Drug  Administration. The FDA has determined that such clearance or approval  is not necessary. This test is used for clinical purposes. It should not  be regarded as investigational or for research. This laboratory is  certified under the [place]  (CLIA-88) as qualified to perform high complexity clinical laboratory  testing.  The controls stained appropriately.   Results for orders placed or performed during the hospital encounter of 08/11/22 (from the past 72 hour(s))  Surgical pathology     Status: None   Collection Time: 08/11/22 10:34 AM  Result Value Ref Range   SURGICAL PATHOLOGY      SURGICAL PATHOLOGY CASE: [id number] PATIENT: [facility] Surgical Pathology Report     Clinical History: Colon cancer (crm)     FINAL MICROSCOPIC DIAGNOSIS:  A. COLON, RECTOSIDMOID, RESECTION: -   Invasive well to moderately differentiated adenocarcinoma with invasion through submucosa, muscularis propria and into perirectal/subserosal soft tissue -  Positive for high-grade tumor budding; negative for lymphovascular and perineural invasion -  Margins negative -  14 lymph nodes,  negative for malignancy (0/14) pT3 pN0 pMX  B. FINAL DISTAL MARGIN: -  Colon margins with evidence of tattoo type pigment otherwise viable and negative for dysplasia/malignancy.  ONCOLOGY TABLE:   COLON AND RECTUM, CARCINOMA:  Resection, Including Transanal Disk Excision of Rectal Neoplasms  Procedure: Low anterior resection Tumor Site: Distal end of the rectosigmoid resection Tumor Size: 5.5 x 5.0 x 1.7 cm Macroscopic Tumor Perforation: Not identified Macroscopic Ev aluation of Mesorectum (required for rectal cancer): Complete Histologic Type: Adenocarcinoma Histologic Grade: Well to moderately differentiated with focal high-grade tumor budding Multiple Primary Sites: N/A Tumor Extension: Extends into perirectal/subserosal soft tissue. Lymphovascular Invasion: Not identified Perineural Invasion: Not identified Treatment Effect: No known presurgical therapy Margins:      Margin Status for Invasive Carcinoma: All margins negative for invasive carcinoma      Distance from Invasive Carcinoma to Radial (Circumferential) Margin (required for rectal           tumors): 5 cm      Distance from Invasive Carcinoma to Closest Mucosal Margin (relevant and required only for           transanal disc excisions): N/A; not a transanal disc excision      Margin Status for Non-Invasive Tumor: All margins negative Regional Lymph Nodes:      Number of Lymph Nodes with Tumor: 0      Number of Lymph Nodes Examined: 14 Tumor Deposits: Not identified  Distant Metastasis:      Distant Site(s) Involved: N/A Pathologic Stage Classification (pTNM, AJCC 8th Edition): pT3, pN0 Ancillary Studies: MMR/MSI pending; to be  reported in an addendum Representative Tumor Block: A3 and A7 Comments: [None]  (v4.2.0.1)    DESCRIPTION:  Specimen: Rectosigmoid colon, received fresh.  The open end is clinically identified as proximal. Specimen integrity: Intact Specimen length: 24 cm Mesorectal intactness: Intact Tumor location: Tumor is located at the distal end of the segment. Tumor size: The tumor consists of a sessile, ulcerated, tan-red circumferential mass measuring 5.5 cm in length and 5 cm in circumference.  The tumor measures 1.7 cm in maximum thickness. Percent of bowel circumference involved: 100% Tumor distance to margins:                      Proximal: 18 cm                      Distal: 0.5 cm                      Radial (posterior ascending, posterior descending; lateral and posterior mid-rectum; and entire  lower 1/3 rectum): 5 cm Macroscopic extent of tumor invasion: The tumor extends through the wall into the surrounding soft tissue. Total presumed lymph nodes: There are 19 tan-yellow ovoid nodules grossly consistent with lymph nodes measuring 0.3 to 1.1 cm in greatest dimension. Extramural satellite tumor nodules: Not grossly identified. Mucosal polyp(s): Not grossly identified. Additional findings: The uninvolved mucosa is glistening and tan. Block summary: 11 blocks submitted 1 = proximal margin 2 = distal margin 3 = tumor with inked serosa 4 = tumor with proximal uninvolved mucosa transition 5 = tumor with distal uninvolved mucosa transition 6, 7 = tumor with deep extension 8-10 = 18 whole lymph nodes 11 = 1 sectioned lymph node  B: Received fresh is a portion of colon measuring 1.5 cm in length and 1.5 cm in diameter.  The mucosa is glistening and tan.  There are  staples present at one end.  Sections are submitted in 1 cassette.  (GRP 08/12/2022)     Fina l Diagnosis performed by Tilford Pillar DO.   Electronically signed 08/13/2022 Technical and / or Professional  components performed at [facility], [place] [address] [place], [place] [address].  Immunohistochemistry Technical component (if applicable) was performed at [facility]. [address] [place], [place], [place] [address].   IMMUNOHISTOCHEMISTRY DISCLAIMER (if applicable): Some of these immunohistochemical stains may have been developed and the performance characteristics determine by [facility]. Some may not have been cleared or approved by the U.S. Food and Drug Administration. The FDA has determined that such clearance or approval is not necessary. This test is used for clinical purposes. It should not be regarded as investigational or for research. This laboratory is certified under the [place] (CLIA-88) as qualified to perform high complexity clinical laboratory test ing.  The controls stained appropriately.   Glucose, capillary     Status: Abnormal   Collection Time: 08/11/22 12:33 PM  Result Value Ref Range   Glucose-Capillary 203 (H) 70 - 99 mg/dL    Comment: Glucose reference range applies only to samples taken after fasting for at least 8 hours.  Glucose, capillary     Status: Abnormal   Collection Time: 08/11/22  4:27 PM  Result Value Ref Range   Glucose-Capillary 261 (H) 70 - 99 mg/dL    Comment: Glucose reference range applies only to samples taken after fasting for at least 8 hours.  Glucose, capillary     Status: Abnormal   Collection Time: 08/11/22  9:01 PM  Result Value Ref Range   Glucose-Capillary 291 (H) 70 - 99 mg/dL    Comment: Glucose reference range applies only to samples taken after fasting for at least 8 hours.  CBC     Status: Abnormal   Collection Time: 08/12/22  5:19 AM  Result Value Ref Range   WBC 11.5 (H) 4.0 - 10.5 K/uL   RBC 4.11 3.87 - 5.11 MIL/uL   Hemoglobin 9.3 (L) 12.0 - 15.0 g/dL   HCT 30.8 (L) 36.0 - 46.0 %   MCV 74.9 (L) 80.0 - 100.0 fL   MCH  22.6 (L) 26.0 - 34.0 pg   MCHC 30.2 30.0 - 36.0 g/dL   RDW 23.5 (H) 11.5 - 15.5 %   Platelets 294 150 - 400 K/uL   nRBC 0.0 0.0 - 0.2 %    Comment: Performed at [facility], [place] [address] [place], [place] [address]  Basic metabolic panel     Status: Abnormal   Collection Time: 08/12/22  5:19 AM  Result Value Ref Range   Sodium 138 135 - 145 mmol/L   Potassium 4.5 3.5 - 5.1 mmol/L   Chloride 106 98 - 111 mmol/L   CO2 23 22 - 32 mmol/L   Glucose, Bld 372 (H) 70 - 99 mg/dL    Comment: Glucose reference range applies only to samples taken after fasting for at least 8 hours.   BUN 24 (H) 8 - 23 mg/dL   Creatinine, Ser 1.32 (H) 0.44 - 1.00 mg/dL   Calcium 9.0 8.9 - 10.3 mg/dL   GFR, Estimated 46 (L) >60 mL/min    Comment: (NOTE) Calculated using the CKD-EPI Creatinine Equation (2021)    Anion gap 9 5 - 15    Comment: Performed at [facility], [address] [place], [place]  [address]  Glucose, capillary     Status: Abnormal   Collection Time: 08/12/22  7:19 AM  Result Value Ref Range   Glucose-Capillary 386 (H) 70 - 99 mg/dL    Comment: Glucose reference range applies only to samples taken after fasting for at least 8 hours.  Glucose, capillary     Status: Abnormal   Collection Time: 08/12/22 11:33 AM  Result Value Ref Range   Glucose-Capillary 513 (HH) 70 - 99 mg/dL    Comment: Glucose reference range applies only to samples taken after fasting for at least 8 hours.   Comment 1 Notify RN   Basic metabolic panel     Status: Abnormal   Collection Time: 08/12/22 12:03 PM  Result Value Ref Range   Sodium 135 135 - 145 mmol/L   Potassium 4.7 3.5 - 5.1 mmol/L   Chloride 104 98 - 111 mmol/L   CO2 20 (L) 22 - 32 mmol/L   Glucose, Bld 556 (HH) 70 - 99 mg/dL    Comment: CRITICAL RESULT CALLED TO, READ BACK BY AND VERIFIED WITH [place], R RN @ 6967 08/12/22. GILBERT, L Glucose reference range applies only to samples taken after fasting for at least  8 hours.    BUN 26 (H) 8 - 23 mg/dL   Creatinine, Ser 1.41 (H) 0.44 - 1.00 mg/dL   Calcium 9.0 8.9 - 10.3 mg/dL   GFR, Estimated 42 (L) >60 mL/min    Comment: (NOTE) Calculated using the CKD-EPI Creatinine Equation (2021)    Anion gap 11 5 - 15    Comment: Performed at [facility], [place] [address] [place], [place] [address]  Glucose, capillary     Status: Abnormal   Collection Time: 08/12/22 12:59 PM  Result Value Ref Range   Glucose-Capillary 506 (HH) 70 - 99 mg/dL    Comment: Glucose reference range applies only to samples taken after fasting for at least 8 hours.   Comment 1 Notify RN    Comment 2 Document in Chart   Glucose, capillary     Status: Abnormal   Collection Time: 08/12/22  3:05 PM  Result Value Ref Range   Glucose-Capillary 443 (H) 70 - 99 mg/dL    Comment: Glucose reference range applies only to samples taken after fasting for at least 8 hours.  Glucose, capillary     Status: Abnormal   Collection Time: 08/12/22  4:43 PM  Result Value Ref Range   Glucose-Capillary 362 (H) 70 - 99 mg/dL    Comment: Glucose reference range applies only to samples taken after fasting for at least 8 hours.  Basic metabolic panel     Status: Abnormal   Collection Time: 08/12/22  7:41 PM  Result Value Ref Range   Sodium 137 135 - 145 mmol/L   Potassium 4.1 3.5 - 5.1 mmol/L   Chloride 106 98 - 111 mmol/L   CO2 25 22 - 32 mmol/L   Glucose, Bld 240 (H) 70 - 99 mg/dL    Comment: Glucose reference range applies only to samples taken after fasting for at least 8 hours.   BUN 25 (H) 8 - 23 mg/dL   Creatinine, Ser 1.38 (H) 0.44 - 1.00 mg/dL   Calcium 8.9 8.9 - 10.3 mg/dL   GFR, Estimated 44 (L) >60 mL/min    Comment: (NOTE) Calculated using the CKD-EPI Creatinine Equation (2021)    Anion gap 6 5 - 15    Comment: Performed at [facility], [place]  [address] [place], [place] [address]  Glucose, capillary     Status: Abnormal   Collection Time:  08/12/22  9:44 PM  Result Value Ref Range   Glucose-Capillary 174 (H) 70 - 99 mg/dL    Comment: Glucose reference range applies only to samples taken after fasting for at least 8 hours.  CBC     Status: Abnormal   Collection Time: 08/13/22  4:52 AM  Result Value Ref Range   WBC 10.4 4.0 - 10.5 K/uL   RBC 3.78 (L) 3.87 - 5.11 MIL/uL   Hemoglobin 8.8 (L) 12.0 - 15.0 g/dL    Comment: Reticulocyte Hemoglobin testing may be clinically indicated, consider ordering this additional test [id number]    HCT 28.4 (L) 36.0 - 46.0 %   MCV 75.1 (L) 80.0 - 100.0 fL   MCH 23.3 (L) 26.0 - 34.0 pg   MCHC 31.0 30.0 - 36.0 g/dL   RDW 23.2 (H) 11.5 - 15.5 %   Platelets 284 150 - 400 K/uL   nRBC 0.0 0.0 - 0.2 %    Comment: Performed at [facility], [place] [address] [place], [place] [address]  Basic metabolic panel     Status: Abnormal   Collection Time: 08/13/22  4:52 AM  Result Value Ref Range   Sodium 139 135 - 145 mmol/L   Potassium 3.9 3.5 - 5.1 mmol/L   Chloride 107 98 - 111 mmol/L   CO2 26 22 - 32 mmol/L   Glucose, Bld 117 (H) 70 - 99 mg/dL    Comment: Glucose reference range applies only to samples taken after fasting for at least 8 hours.   BUN 21 8 - 23 mg/dL   Creatinine, Ser 1.28 (H) 0.44 - 1.00 mg/dL   Calcium 8.9 8.9 - 10.3 mg/dL   GFR, Estimated 48 (L) >60 mL/min    Comment: (NOTE) Calculated using the CKD-EPI Creatinine Equation (2021)    Anion gap 6 5 - 15    Comment: Performed at [facility], [place] [address] [place], [place] [address]  Glucose, capillary     Status: Abnormal   Collection Time: 08/13/22  7:41 AM  Result Value Ref Range   Glucose-Capillary 118 (H) 70 - 99 mg/dL    Comment: Glucose reference range applies only to samples taken after fasting for at least 8 hours.  Glucose, capillary     Status: Abnormal   Collection Time: 08/13/22 11:37 AM  Result Value Ref Range   Glucose-Capillary 160 (H) 70 - 99 mg/dL    Comment: Glucose  reference range applies only to samples taken after fasting for at least 8 hours.  Glucose, capillary     Status: Abnormal   Collection Time: 08/13/22  4:24 PM  Result Value Ref Range   Glucose-Capillary 304 (H) 70 - 99 mg/dL    Comment: Glucose reference range applies only to samples taken after fasting for at least 8 hours.  Glucose, capillary     Status: Abnormal   Collection Time: 08/13/22  9:15 PM  Result Value Ref Range   Glucose-Capillary 323 (H) 70 - 99 mg/dL    Comment: Glucose reference range applies only to samples taken after fasting for at least 8 hours.  CBC     Status: Abnormal   Collection Time: 08/14/22  5:47 AM  Result Value Ref Range   WBC 9.8 4.0 - 10.5 K/uL   RBC 4.03 3.87 - 5.11 MIL/uL   Hemoglobin 9.1 (L) 12.0 - 15.0 g/dL  HCT 30.1 (L) 36.0 - 46.0 %   MCV 74.7 (L) 80.0 - 100.0 fL   MCH 22.6 (L) 26.0 - 34.0 pg   MCHC 30.2 30.0 - 36.0 g/dL   RDW 23.8 (H) 11.5 - 15.5 %   Platelets 306 150 - 400 K/uL   nRBC 0.0 0.0 - 0.2 %    Comment: Performed at [facility], [place] [address] [place], [place] [address]  Basic metabolic panel     Status: Abnormal   Collection Time: 08/14/22  5:47 AM  Result Value Ref Range   Sodium 139 135 - 145 mmol/L   Potassium 3.4 (L) 3.5 - 5.1 mmol/L   Chloride 107 98 - 111 mmol/L   CO2 27 22 - 32 mmol/L   Glucose, Bld 185 (H) 70 - 99 mg/dL    Comment: Glucose reference range applies only to samples taken after fasting for at least 8 hours.   BUN 17 8 - 23 mg/dL   Creatinine, Ser 1.25 (H) 0.44 - 1.00 mg/dL   Calcium 9.1 8.9 - 10.3 mg/dL   GFR, Estimated 49 (L) >60 mL/min    Comment: (NOTE) Calculated using the CKD-EPI Creatinine Equation (2021)    Anion gap 5 5 - 15    Comment: Performed at [facility], [place] [address], [place], [place] [address]  Glucose, capillary     Status: Abnormal   Collection Time: 08/14/22  7:07 AM  Result Value Ref Range   Glucose-Capillary 182 (H) 70 - 99 mg/dL     Comment: Glucose reference range applies only to samples taken after fasting for at least 8 hours.    No results found.  Past Medical History:  Diagnosis Date   Anemia    Arthritis    Asthma    Cancer of sigmoid ([place])    COPD (chronic obstructive pulmonary disease) ([place])    Depression    Diabetes mellitus    Diastolic dysfunction 23/76/2831   Dyspnea    Generalized weakness    Hyperlipidemia    Hypertension    Hypothyroidism    Obesity    Palpitations    Pneumonia    PONV (postoperative nausea and vomiting)    Prolonged QT interval 05/14/2015   Sleep apnea     Past Surgical History:  Procedure Laterality Date   BIOPSY  04/06/2022   Procedure: BIOPSY;  Surgeon: Harvel Quale, MD;  [facility];  Service: Gastroenterology;;   CATARACT EXTRACTION     CESAREAN SECTION     CHOLECYSTECTOMY     COLONOSCOPY WITH PROPOFOL N/A 04/06/2022   Procedure: COLONOSCOPY WITH PROPOFOL;  Surgeon: Harvel Quale, MD;  [facility];  Service: Gastroenterology;  Laterality: N/A;  805 ASA 2 patient in [place]   ESOPHAGOGASTRODUODENOSCOPY (EGD) WITH PROPOFOL N/A 04/06/2022   Procedure: ESOPHAGOGASTRODUODENOSCOPY (EGD) WITH PROPOFOL;  Surgeon: Harvel Quale, MD;  [facility];  Service: Gastroenterology;  Laterality: N/A;   HEMOSTASIS CLIP PLACEMENT  04/06/2022   Procedure: HEMOSTASIS CLIP PLACEMENT;  Surgeon: Harvel Quale, MD;  [facility];  Service: Gastroenterology;;   POLYPECTOMY  04/06/2022   Procedure: POLYPECTOMY;  Surgeon: Harvel Quale, MD;  [facility];  Service: Gastroenterology;;   SUBMUCOSAL TATTOO INJECTION  04/06/2022   Procedure: SUBMUCOSAL TATTOO INJECTION;  Surgeon: Harvel Quale, MD;  [facility];  Service: Gastroenterology;;    Social History   Socioeconomic History  Marital status: Married    Spouse name: Not on file    Number of children: Not on file   Years of education: Not on file   Highest education level: Not on file  Occupational History   Not on file  Tobacco Use   Smoking status: Never    Passive exposure: Yes   Smokeless tobacco: Never  Vaping Use   Vaping Use: Never used  Substance and Sexual Activity   Alcohol use: No    Alcohol/week: 0.0 standard drinks of alcohol   Drug use: No   Sexual activity: Yes    Birth control/protection: None  Other Topics Concern   Not on file  Social History Narrative   Lives with husband.  One living son.  Daughter died after transplant age 40.     Social Determinants of Health   Financial Resource Strain: Not on file  Food Insecurity: Unknown (08/12/2022)   Hunger Vital Sign    Worried About Running Out of Food in the Last Year: Patient refused    [place] in the Last Year: Patient refused  Transportation Needs: No Transportation Needs (08/12/2022)   PRAPARE - Hydrologist (Medical): No    Lack of Transportation (Non-Medical): No  Physical Activity: Not on file  Stress: Not on file  Social Connections: Not on file  Intimate Partner Violence: Unknown (08/12/2022)   Humiliation, Afraid, Rape, and Kick questionnaire    Fear of Current or Ex-Partner: No    Emotionally Abused: No    Physically Abused: Not on file    Sexually Abused: Not on file    Family History  Problem Relation Age of Onset   Stroke Mother    Diabetes Father    Heart failure Father    Hypertension Father    Diabetes Sister    Heart failure Sister    Hypertension Sister    Stroke Sister    Cancer Other    Stroke Sister     Current Facility-Administered Medications  Medication Dose Route Frequency Provider Last Rate Last Admin   albuterol (PROVENTIL) (2.5 MG/3ML) 0.083% nebulizer solution 2.5 mg  2.5 mg Inhalation M0L PRN Leighton Ruff, MD       alum & mag hydroxide-simeth (MAALOX/MYLANTA) 200-200-20 MG/5ML suspension 30 mL  30 mL Oral  K9Z PRN Leighton Ruff, MD       amLODipine (NORVASC) tablet 10 mg  10 mg Oral Daily Leighton Ruff, MD   10 mg at 08/13/22 1010   enoxaparin (LOVENOX) injection 40 mg  40 mg Subcutaneous P91T Leighton Ruff, MD   40 mg at 08/14/22 0829   feeding supplement (GLUCERNA SHAKE) (GLUCERNA SHAKE) liquid 237 mL  237 mL Oral Daily Leighton Ruff, MD   056 mL at 08/12/22 0816   furosemide (LASIX) tablet 20 mg  20 mg Oral Daily PRN Leighton Ruff, MD       gabapentin (NEURONTIN) capsule 100 mg  100 mg Oral TID Leighton Ruff, MD   979 mg at 08/13/22 2301   hydrALAZINE (APRESOLINE) tablet 25 mg  25 mg Oral Y8A Leighton Ruff, MD   25 mg at 08/14/22 0515   hydrocortisone 1 % lotion   Topical TID Dessa Phi, DO   Given at 08/13/22 2301   HYDROmorphone (DILAUDID) injection 0.5 mg  0.5 mg Intravenous X6P PRN Leighton Ruff, MD       hydrOXYzine (ATARAX) tablet 10 mg  10 mg Oral V3Z PRN Leighton Ruff, MD  insulin aspart (novoLOG) injection 0-20 Units  0-20 Units Subcutaneous TID WC Leighton Ruff, MD   4 Units at 08/14/22 0829   insulin aspart (novoLOG) injection 0-5 Units  0-5 Units Subcutaneous QHS Leighton Ruff, MD   3 Units at 08/13/22 2117   insulin aspart (novoLOG) injection 4 Units  4 Units Subcutaneous TID WC Dessa Phi, DO   4 Units at 08/14/22 0831   insulin glargine-yfgn (SEMGLEE) injection 28 Units  28 Units Subcutaneous Daily Leighton Ruff, MD   28 Units at 08/13/22 1009   insulin glargine-yfgn (SEMGLEE) injection 30 Units  30 Units Subcutaneous QHS Leighton Ruff, MD   30 Units at 08/13/22 2118   levothyroxine (SYNTHROID) tablet 88 mcg  88 mcg Oral QAC breakfast Leighton Ruff, MD   88 mcg at 08/14/22 0514   liver oil-zinc oxide (DESITIN) 40 % ointment 1 Application  1 Application Topical PRN Leighton Ruff, MD       nystatin (MYCOSTATIN/NYSTOP) topical powder   Topical BID Leighton Ruff, MD   Given at 08/13/22 2302   oxyCODONE (Oxy IR/ROXICODONE) immediate release tablet 5 mg  5 mg  Oral W0J PRN Leighton Ruff, MD       pantoprazole (PROTONIX) EC tablet 40 mg  40 mg Oral Daily Leighton Ruff, MD   40 mg at 08/13/22 1010   prochlorperazine (COMPAZINE) tablet 10 mg  10 mg Oral W1X PRN Leighton Ruff, MD       Or   prochlorperazine (COMPAZINE) injection 5-10 mg  5-10 mg Intravenous B1Y PRN Leighton Ruff, MD       propranolol (INDERAL) tablet 80 mg  80 mg Oral Daily Leighton Ruff, MD   80 mg at 08/13/22 1011   saccharomyces boulardii (FLORASTOR) capsule 250 mg  250 mg Oral BID Leighton Ruff, MD   782 mg at 08/12/22 2228   sertraline (ZOLOFT) tablet 150 mg  150 mg Oral Daily Leighton Ruff, MD   956 mg at 08/13/22 1010   simethicone (MYLICON) chewable tablet 40 mg  40 mg Oral O1H PRN Leighton Ruff, MD         Allergies  Allergen Reactions   Carvedilol Palpitations   Benicar [Olmesartan] Swelling   Codeine Other (See Comments)    Confusion    Sulfa Antibiotics Swelling    Whole face swells   Trulicity [Dulaglutide] Diarrhea    Signed:   Adin Hector, MD, FACS, MASCRS Esophageal, Gastrointestinal & Colorectal Surgery Robotic and Minimally Invasive Surgery  [facility] [place] [address] [place], [place] [address] [phone] Fax [phone] Main  CONTACT INFORMATION:  Weekday (9AM-5PM): Call [facility] main office at [phone]  Weeknight (5PM-9AM) or Weekend/Holiday: Check [email or web address] (password " [email or web address]") for General Surgery [facility] coverage  (Please, do not use SecureChat as it is not reliable communication to reach operating surgeons for immediate patient care)      08/14/2022, 9:56 AM

## 2022-08-14 NOTE — NC FL2 (Signed)
[place] LEVEL OF CARE SCREENING TOOL     IDENTIFICATION  Patient Name: [name] Birthdate: 1961/05/22 Sex: female Admission Date (Current Location): 08/11/2022  [facility] and [place] Number:  Kathleen Argue [id number] [place] and Address:         Provider Number: [id number]  Attending Physician Name and Address:  Leighton Ruff, MD  Relative Name and Phone Number:       Current Level of Care: Hospital Recommended Level of Care: [place] Prior Approval Number:    Date Approved/Denied:   PASRR Number: [id number]  Discharge Plan: SNF    Current Diagnoses: Patient Active Problem List   Diagnosis Date Noted   Colon cancer ([place]) 08/11/2022   Cancer of sigmoid colon ([place]) 05/12/2022   Diarrhea of presumed infectious origin 28/78/6767   Acute metabolic encephalopathy 20/94/7096   Iron deficiency anemia    Pressure injury of skin 11/24/2021   Open wound of left foot 11/22/2021   Chronic venous stasis 11/22/2021   Leukocytosis 11/22/2021   Thrombocytosis 11/22/2021   Hyperglycemia due to diabetes mellitus ([place]) 11/22/2021   Lactic acidosis 11/22/2021   Hypoalbuminemia due to protein-calorie malnutrition ([place]) 11/22/2021   Elevated liver enzymes 11/22/2021   Mixed hyperlipidemia 11/22/2021   GERD (gastroesophageal reflux disease) 11/22/2021   Chronic diastolic CHF (congestive heart failure) ([place]) 11/22/2021   Diabetic foot infection ([place]) 11/22/2021   Uncontrolled type 2 diabetes mellitus with hyperglycemia, with long-term current use of insulin ([place]) 11/22/2021   Lower extremity cellulitis 11/21/2021   Morbid obesity with BMI of 50.0-59.9, adult ([place]) 01/29/2020   Educated about COVID-19 virus infection 01/29/2020   OSA (obstructive sleep apnea) 12/21/2019   Asthma 28/36/6294   Diastolic dysfunction 76/54/6503   Prolonged QT interval 05/14/2015   Palpitations 07/23/2014   Generalized weakness 07/23/2014   Diabetes mellitus ([place])  07/22/2014   Essential hypertension 07/22/2014   COPD (chronic obstructive pulmonary disease) ([place]) 07/22/2014   Depression 07/22/2014    Orientation RESPIRATION BLADDER Height & Weight     Self, Time, Situation, Place  Normal Incontinent Weight: 266 lb 8.6 oz (120.9 kg) Height:  '5\' 1"'$  (154.9 cm)  BEHAVIORAL SYMPTOMS/MOOD NEUROLOGICAL BOWEL NUTRITION STATUS      Incontinent Diet (Regular)  AMBULATORY STATUS COMMUNICATION OF NEEDS Skin   Total Care Verbally  (rash on back, thigh, buttocks)                       Personal Care Assistance Level of Assistance  Bathing, Feeding, Dressing Bathing Assistance: Limited assistance Feeding assistance: Independent Dressing Assistance: Limited assistance     Functional Limitations Info  Sight, Hearing, Speech Sight Info: Adequate Hearing Info: Adequate Speech Info: Adequate    SPECIAL CARE FACTORS FREQUENCY                       Contractures Contractures Info: Not present    Additional Factors Info  Code Status, Allergies, Psychotropic Code Status Info: Full Allergies Info: Carvedilol   Benicar (Olmesartan)   Codeine   Sulfa Antibiotics   Trulicity (Dulaglutide) Psychotropic Info: gabapentin , zoloft         Current Medications (08/14/2022):  This is the current hospital active medication list Current Facility-Administered Medications  Medication Dose Route Frequency Provider Last Rate Last Admin   albuterol (PROVENTIL) (2.5 MG/3ML) 0.083% nebulizer solution 2.5 mg  2.5 mg Inhalation T4S PRN Leighton Ruff, MD       alum & mag hydroxide-simeth (MAALOX/MYLANTA)  200-200-20 MG/5ML suspension 30 mL  30 mL Oral U3J PRN Leighton Ruff, MD       amLODipine (NORVASC) tablet 10 mg  10 mg Oral Daily Leighton Ruff, MD   10 mg at 08/14/22 1011   enoxaparin (LOVENOX) injection 40 mg  40 mg Subcutaneous S97W Leighton Ruff, MD   40 mg at 08/14/22 0829   feeding supplement (GLUCERNA SHAKE) ([place]) liquid 237 mL  237 mL Oral  Daily Leighton Ruff, MD   263 mL at 08/14/22 1008   furosemide (LASIX) tablet 20 mg  20 mg Oral Daily PRN Leighton Ruff, MD       gabapentin (NEURONTIN) capsule 100 mg  100 mg Oral TID Leighton Ruff, MD   785 mg at 08/14/22 1010   hydrALAZINE (APRESOLINE) tablet 25 mg  25 mg Oral Y8F Leighton Ruff, MD   25 mg at 08/14/22 0515   hydrocortisone 1 % lotion   Topical TID Dessa Phi, DO   1 Application at 02/77/41 1009   HYDROmorphone (DILAUDID) injection 0.5 mg  0.5 mg Intravenous O8N PRN Leighton Ruff, MD       hydrOXYzine (ATARAX) tablet 10 mg  10 mg Oral O6V PRN Leighton Ruff, MD       insulin aspart (novoLOG) injection 0-20 Units  0-20 Units Subcutaneous TID WC Leighton Ruff, MD   4 Units at 08/14/22 0829   insulin aspart (novoLOG) injection 0-5 Units  0-5 Units Subcutaneous QHS Leighton Ruff, MD   3 Units at 08/13/22 2117   insulin aspart (novoLOG) injection 4 Units  4 Units Subcutaneous TID WC Dessa Phi, DO   4 Units at 08/14/22 0831   insulin glargine-yfgn (SEMGLEE) injection 28 Units  28 Units Subcutaneous Daily Leighton Ruff, MD   28 Units at 08/14/22 1012   insulin glargine-yfgn (SEMGLEE) injection 30 Units  30 Units Subcutaneous QHS Leighton Ruff, MD   30 Units at 08/13/22 2118   levothyroxine (SYNTHROID) tablet 88 mcg  88 mcg Oral QAC breakfast Leighton Ruff, MD   88 mcg at 08/14/22 0514   liver oil-zinc oxide (DESITIN) 40 % ointment 1 Application  1 Application Topical PRN Leighton Ruff, MD       nystatin (MYCOSTATIN/NYSTOP) topical powder   Topical BID Leighton Ruff, MD   Given at 08/13/22 2302   oxyCODONE (Oxy IR/ROXICODONE) immediate release tablet 5 mg  5 mg Oral E7M PRN Leighton Ruff, MD       pantoprazole (PROTONIX) EC tablet 40 mg  40 mg Oral Daily Leighton Ruff, MD   40 mg at 08/14/22 1011   prochlorperazine (COMPAZINE) tablet 10 mg  10 mg Oral C9O PRN Leighton Ruff, MD       Or   prochlorperazine (COMPAZINE) injection 5-10 mg  5-10 mg Intravenous B0J PRN  Leighton Ruff, MD       propranolol (INDERAL) tablet 80 mg  80 mg Oral Daily Leighton Ruff, MD   80 mg at 08/14/22 1011   saccharomyces boulardii (FLORASTOR) capsule 250 mg  250 mg Oral BID Leighton Ruff, MD   628 mg at 08/14/22 1010   sertraline (ZOLOFT) tablet 150 mg  150 mg Oral Daily Leighton Ruff, MD   366 mg at 08/14/22 1010   simethicone (MYLICON) chewable tablet 40 mg  40 mg Oral Q9U PRN Leighton Ruff, MD         Discharge Medications: Please see discharge summary for a list of discharge medications.  Relevant Imaging Results:  Relevant Lab Results:   Additional  Information [id number]  Kimber Relic, LCSW

## 2022-08-14 NOTE — Progress Notes (Signed)
PROGRESS NOTE    [name]  VOH:[id number] DOB: 1961/07/05 DOA: 08/11/2022 PCP: Bridget Hartshorn, NP     Brief Narrative:  [name] is a 61 y.o. female with past medical history of anemia, osteoarthritis, asthma, COPD, depression, type 2 diabetes, diastolic dysfunction, dyspnea, generalized weakness, hypertension, hyperlipidemia, hypothyroidism, class III obesity with a BMI of 50.32 kg/m at the moment, palpitations, history of pneumonia, PONV, prolonged QT interval, sleep apnea who underwent robotic assisted LAR 9/6 and we are evaluating due to hyperglycemia.    New events last 24 hours / Subjective: Feeling well. Blood sugar much better controlled today. States she's going back to SNF today.   Assessment & Plan:   Principal Problem:   Colon cancer ([place]) Active Problems:   Essential hypertension   COPD (chronic obstructive pulmonary disease) (HCC)   Depression   Hyperglycemia due to diabetes mellitus (HCC)   Mixed hyperlipidemia   GERD (gastroesophageal reflux disease)   Chronic diastolic CHF (congestive heart failure) (HCC)   Iron deficiency anemia   Colon cancer status post robotic resection -Per primary team  Hyperglycemia in setting of diabetes mellitus, insulin-dependent -Continue long-acting insulin twice daily, added mealtime NovoLog as well as sliding scale insulin  Hypertension -Amlodipine, hydralazine, propranolol  COPD -Without exacerbation  Mood disorder -Sertraline, hydroxyzine as needed  GERD -PPI  Chronic diastolic heart failure -Without exacerbation  CKD stage 3a -Stable   Hypothyroidism -Synthroid    Antimicrobials:  Anti-infectives (From admission, onward)    Start     Dose/Rate Route Frequency Ordered Stop   08/11/22 1515  fosfomycin (MONUROL) packet 3 g  Status:  Discontinued        3 g Oral Every 3 DAYS 08/11/22 1424 08/11/22 1453   08/11/22 0630  cefoTEtan (CEFOTAN) 2 g in sodium chloride 0.9 % 100 mL IVPB         2 g 200 mL/hr over 30 Minutes Intravenous On call to O.R. 08/11/22 0622 08/11/22 0910        Objective: Vitals:   08/13/22 1312 08/13/22 2115 08/14/22 0500 08/14/22 0517  BP: (!) 129/50 (!) 145/98  (!) 149/55  Pulse: 69 72  66  Resp: '18 17  18  '$ Temp: 98.8 F (37.1 C) 98.4 F (36.9 C)  98.6 F (37 C)  TempSrc: Oral Oral  Oral  SpO2: 92% 94%  94%  Weight:   120.9 kg   Height:        Intake/Output Summary (Last 24 hours) at 08/14/2022 0950 Last data filed at 08/14/2022 0517 Gross per 24 hour  Intake 1310 ml  Output 650 ml  Net 660 ml    Filed Weights   08/12/22 2300 08/13/22 0500 08/14/22 0500  Weight: 120.8 kg 120.7 kg 120.9 kg    Examination:  General exam: Appears calm and comfortable  Respiratory system: Clear to auscultation. Respiratory effort normal. No respiratory distress. No conversational dyspnea.  Cardiovascular system: S1 & S2 heard, RRR. No murmurs. No pedal edema. Gastrointestinal system: Abdomen is nondistended, soft and nontender. Normal bowel sounds heard. Central nervous system: Alert and oriented. No focal neurological deficits. Speech clear.  Extremities: Symmetric in appearance  Skin: No rashes, lesions or ulcers on exposed skin  Psychiatry: Judgement and insight appear normal. Mood & affect appropriate.   Data Reviewed: I have personally reviewed following labs and imaging studies  CBC: Recent Labs  Lab 08/12/22 0519 08/13/22 0452 08/14/22 0547  WBC 11.5* 10.4 9.8  HGB 9.3* 8.8* 9.1*  HCT  30.8* 28.4* 30.1*  MCV 74.9* 75.1* 74.7*  PLT 294 284 532    Basic Metabolic Panel: Recent Labs  Lab 08/12/22 0519 08/12/22 1203 08/12/22 1941 08/13/22 0452 08/14/22 0547  NA 138 135 137 139 139  K 4.5 4.7 4.1 3.9 3.4*  CL 106 104 106 107 107  CO2 23 20* '25 26 27  '$ GLUCOSE 372* 556* 240* 117* 185*  BUN 24* 26* 25* 21 17  CREATININE 1.32* 1.41* 1.38* 1.28* 1.25*  CALCIUM 9.0 9.0 8.9 8.9 9.1    GFR: Estimated Creatinine Clearance:  57.5 mL/min (A) (by C-G formula based on SCr of 1.25 mg/dL (H)). Liver Function Tests: No results for input(s): "AST", "ALT", "ALKPHOS", "BILITOT", "PROT", "ALBUMIN" in the last 168 hours. No results for input(s): "LIPASE", "AMYLASE" in the last 168 hours. No results for input(s): "AMMONIA" in the last 168 hours. Coagulation Profile: No results for input(s): "INR", "PROTIME" in the last 168 hours. Cardiac Enzymes: No results for input(s): "CKTOTAL", "CKMB", "CKMBINDEX", "TROPONINI" in the last 168 hours. BNP (last 3 results) No results for input(s): "PROBNP" in the last 8760 hours. HbA1C: No results for input(s): "HGBA1C" in the last 72 hours. CBG: Recent Labs  Lab 08/13/22 0741 08/13/22 1137 08/13/22 1624 08/13/22 2115 08/14/22 0707  GLUCAP 118* 160* 304* 323* 182*    Lipid Profile: No results for input(s): "CHOL", "HDL", "LDLCALC", "TRIG", "CHOLHDL", "LDLDIRECT" in the last 72 hours. Thyroid Function Tests: No results for input(s): "TSH", "T4TOTAL", "FREET4", "T3FREE", "THYROIDAB" in the last 72 hours. Anemia Panel: No results for input(s): "VITAMINB12", "FOLATE", "FERRITIN", "TIBC", "IRON", "RETICCTPCT" in the last 72 hours. Sepsis Labs: No results for input(s): "PROCALCITON", "LATICACIDVEN" in the last 168 hours.  No results found for this or any previous visit (from the past 240 hour(s)).    Radiology Studies: No results found.    Scheduled Meds:  amLODipine  10 mg Oral Daily   enoxaparin (LOVENOX) injection  40 mg Subcutaneous Q24H   feeding supplement (GLUCERNA SHAKE)  237 mL Oral Daily   gabapentin  100 mg Oral TID   hydrALAZINE  25 mg Oral Q8H   hydrocortisone   Topical TID   insulin aspart  0-20 Units Subcutaneous TID WC   insulin aspart  0-5 Units Subcutaneous QHS   insulin aspart  4 Units Subcutaneous TID WC   insulin glargine-yfgn  28 Units Subcutaneous Daily   insulin glargine-yfgn  30 Units Subcutaneous QHS   levothyroxine  88 mcg Oral QAC breakfast    nystatin   Topical BID   pantoprazole  40 mg Oral Daily   propranolol  80 mg Oral Daily   saccharomyces boulardii  250 mg Oral BID   sertraline  150 mg Oral Daily   Continuous Infusions:   LOS: 3 days     Dessa Phi, DO [facility] 08/14/2022, 9:50 AM   Available via Epic secure chat 7am-7pm After these hours, please refer to coverage provider listed on [email or web address]

## 2022-08-16 LAB — SURGICAL PATHOLOGY

## 2022-08-26 ENCOUNTER — Emergency Department (HOSPITAL_COMMUNITY): Payer: Medicare HMO

## 2022-08-26 ENCOUNTER — Other Ambulatory Visit: Payer: Self-pay

## 2022-08-26 ENCOUNTER — Encounter (HOSPITAL_COMMUNITY): Payer: Self-pay

## 2022-08-26 ENCOUNTER — Emergency Department (HOSPITAL_COMMUNITY)
Admission: EM | Admit: 2022-08-26 | Discharge: 2022-08-26 | Disposition: A | Discharge status: Home / Self Care | Payer: Medicare HMO | Attending: Emergency Medicine | Admitting: Emergency Medicine

## 2022-08-26 DIAGNOSIS — E119 Type 2 diabetes mellitus without complications: Secondary | ICD-10-CM | POA: Diagnosis not present

## 2022-08-26 DIAGNOSIS — I10 Essential (primary) hypertension: Secondary | ICD-10-CM | POA: Insufficient documentation

## 2022-08-26 DIAGNOSIS — Z79899 Other long term (current) drug therapy: Secondary | ICD-10-CM | POA: Diagnosis not present

## 2022-08-26 DIAGNOSIS — M7989 Other specified soft tissue disorders: Secondary | ICD-10-CM | POA: Diagnosis present

## 2022-08-26 DIAGNOSIS — Z794 Long term (current) use of insulin: Secondary | ICD-10-CM | POA: Insufficient documentation

## 2022-08-26 MED ORDER — ACETAMINOPHEN 500 MG PO TABS
1000.0000 mg | ORAL_TABLET | Freq: Once | ORAL | Status: AC
  Administered 2022-08-26: 1000 mg via ORAL
  Filled 2022-08-26: qty 2

## 2022-08-26 NOTE — ED Notes (Signed)
[facility] called to setup transport back to facility at this time.

## 2022-08-26 NOTE — ED Notes (Signed)
[facility] called to cancel transport at this time.

## 2022-08-26 NOTE — Discharge Instructions (Addendum)
It was our pleasure to provide your ER care today - we hope that you feel better.  Your xray was read as showing no acute fracture, and  1. New juxta-articular erosion involving the first proximal phalanx  head, consistent with gout.   Elevate foot to help with swelling. You may take acetaminophen or ibuprofen as need.   Take acetaminophen as need for pain.  Return to ER if worse, new symptoms, fevers, chest pain, trouble breathing, spreading redness, severe swelling or pain, or other concern.

## 2022-08-26 NOTE — ED Triage Notes (Signed)
Pt arrived from via [facility] from [place] with complaints of rt toe pain. Received xray yesterday at facility of foot and was told toe is broken.

## 2022-08-26 NOTE — ED Notes (Signed)
Pt Husband is transporting pt back to [facility]. [facility] would not work for her to sign

## 2022-08-26 NOTE — ED Provider Notes (Signed)
[facility] Provider Note   CSN: [id number] Arrival date & time: 08/26/22  1123     History  Chief Complaint  Patient presents with   Toe Pain    [name] is a 61 y.o. female.  Pt with c/o right great toe swelling and recent outpatient xrays reportedly showing fracture.  States is not sure how injured it - denies specific injury but was told possible fracture on outpatient imaging. Pt denies significant pain to area, perhaps very minimal discomfort.  Denies other pain or injury. No numbness. Skin intact. Denies hx gout. Denies redness to area.   The history is provided by the patient, the EMS personnel and medical records.  Toe Pain       Home Medications Prior to Admission medications   Medication Sig Start Date End Date Taking? Authorizing Provider  ADMELOG 100 UNIT/ML injection Inject 4-8 Units into the skin 3 (three) times daily with meals. If blood glucose is 200-250= 4 units, 250-300= 5 units, 301-350= 6 units, 351-400= 8 units, > 400 notify MD 04/21/22   [provider]  albuterol (VENTOLIN HFA) 108 (90 Base) MCG/ACT inhaler Inhale 2 puffs into the lungs every 6 (six) hours as needed for wheezing or shortness of breath.    [provider]  alum & mag hydroxide-simeth (MAALOX/MYLANTA) 200-200-20 MG/5ML suspension Take 30 mLs by mouth every 2 (two) hours as needed for indigestion or heartburn. Do not exceed 4 doses in 24 hours    [provider]  amLODipine (NORVASC) 10 MG tablet Take 10 mg by mouth daily. 06/28/22   [provider]  ascorbic acid (VITAMIN C) 500 MG tablet Take 500 mg by mouth daily.    [provider]  atorvastatin (LIPITOR) 10 MG tablet Take 10 mg by mouth at bedtime. 09/29/21   [provider]  Cholecalciferol (VITAMIN D) 50 MCG (2000 UT) CAPS Take 2,000 Units by mouth daily.    [provider]  Emollient (EUCERIN) lotion Apply 1 Application topically every 12  (twelve) hours as needed for dry skin.    [provider]  ferrous sulfate 325 (65 FE) MG tablet Take 325 mg by mouth daily.    [provider]  fosfomycin (MONUROL) 3 g PACK Take 3 g by mouth every 3 (three) days. 05/06/22   [provider]  furosemide (LASIX) 20 MG tablet Take 20 mg by mouth daily as needed for edema. 03/29/22   [provider]  gabapentin (NEURONTIN) 100 MG capsule Take 1 capsule (100 mg total) by mouth 3 (three) times daily. 11/26/21   Johnson, Clanford L, MD  Glucerna (GLUCERNA) LIQD Take 237 mLs by mouth daily. For wound healing    [provider]  hydrALAZINE (APRESOLINE) 25 MG tablet Take 1 tablet (25 mg total) by mouth every 8 (eight) hours. 11/26/21   Johnson, Clanford L, MD  hydrOXYzine (ATARAX/VISTARIL) 10 MG tablet Take 10 mg by mouth every 8 (eight) hours as needed for itching or anxiety.  Patient not taking: Reported on 08/02/2022 11/22/19   [provider]  insulin glargine-yfgn (SEMGLEE) 100 UNIT/ML injection Inject 0.28 mLs (28 Units total) into the skin daily. 08/14/22   Dessa Phi, DO  LANTUS 100 UNIT/ML injection Inject 30 Units into the skin at bedtime. 07/16/22   [provider]  levothyroxine (SYNTHROID) 88 MCG tablet Take 88 mcg by mouth daily before breakfast.    [provider]  loperamide (IMODIUM A-D) 2 MG tablet Take 2  mg by mouth as needed for diarrhea or loose stools. Do not exceed 4 per day    [provider]  magnesium oxide (MAG-OX) 400 (240 Mg) MG tablet Take 400 mg by mouth daily. 11/07/21   [provider]  Menthol-Zinc Oxide (CALMOSEPTINE) 0.44-20.6 % OINT Apply 1 Application topically as needed (preservation/protection after incontinent care).    [provider]  Nystatin (GERHARDT'S BUTT CREAM) CREA As directed Patient not taking: Reported on 08/02/2022 11/26/21   Murlean Iba, MD  nystatin (MYCOSTATIN/NYSTOP) powder Apply topically 2 (two)  times daily. Patient not taking: Reported on 08/02/2022 11/26/21   Murlean Iba, MD  omeprazole (PRILOSEC) 20 MG capsule Take 20 mg by mouth in the morning and at bedtime.    [provider]  potassium chloride SA (K-DUR) 20 MEQ tablet Take 20 mEq by mouth daily.    [provider]  PROCRIT 4000 UNIT/ML injection Inject 4,000 Units into the skin every Monday, Wednesday, and Friday. Hold if hemoglobin is > 10 08/01/22   [provider]  propranolol (INDERAL) 80 MG tablet Take 80 mg by mouth daily.    [provider]  sertraline (ZOLOFT) 50 MG tablet Take 3 tablets (150 mg total) by mouth daily. 11/27/21   Murlean Iba, MD      Allergies    Carvedilol, Benicar [olmesartan], Codeine, Sulfa antibiotics, and Trulicity [dulaglutide]    Review of Systems   Review of Systems  Constitutional:  Negative for fever.  Musculoskeletal:        Right great toe pain  Skin:  Negative for rash and wound.  Neurological:  Negative for numbness.    Physical Exam Updated Vital Signs BP (!) 143/58 (BP Location: Right Arm)   Pulse 62   Temp 98.2 F (36.8 C) (Oral)   Resp 17   Ht 1.549 m ('5\' 1"'$ )   Wt 119.3 kg   LMP 08/06/2012   SpO2 97%   BMI 49.69 kg/m  Physical Exam Vitals and nursing note reviewed.  Constitutional:      Appearance: Normal appearance. She is well-developed.  HENT:     Head: Atraumatic.     Nose: Nose normal.     Mouth/Throat:     Mouth: Mucous membranes are moist.  Eyes:     General: No scleral icterus.    Conjunctiva/sclera: Conjunctivae normal.  Neck:     Trachea: No tracheal deviation.  Cardiovascular:     Rate and Rhythm: Normal rate.     Pulses: Normal pulses.  Pulmonary:     Effort: Pulmonary effort is normal. No respiratory distress.  Musculoskeletal:        General: No swelling.     Cervical back: Neck supple. No muscular tenderness.     Comments: Mild sts right great toe diffusely. Normal cap refill distally.  Skin intact. No sign of infection, no erythema, no increased warmth, skin of normal color.   Skin:    General: Skin is warm and dry.     Findings: No rash.  Neurological:     Mental Status: She is alert.     Comments: Alert, speech normal. Right foot is nvi.   Psychiatric:        Mood and Affect: Mood normal.     ED Results / Procedures / Treatments   Labs (all labs ordered are listed, but only abnormal results are displayed) Labs Reviewed - No data to display  EKG None  Radiology DG Toe Great Right  Result Date: 08/26/2022 CLINICAL DATA:  Right great toe pain.  No injury. EXAM: RIGHT GREAT TOE COMPARISON:  Right foot x-rays dated November 21, 2021. FINDINGS: New juxta-articular erosion involving the medial first proximal phalanx head. No acute fracture or dislocation. Joint spaces are preserved. IMPRESSION: 1. New juxta-articular erosion involving the first proximal phalanx head, consistent with gout. Electronically Signed   By: Titus Dubin M.D.   On: 08/26/2022 12:22    Procedures Procedures    Medications Ordered in ED Medications - No data to display  ED Course/ Medical Decision Making/ A&P                           Medical Decision Making Problems Addressed: Essential hypertension: chronic illness or injury that poses a threat to life or bodily functions Insulin dependent type 2 diabetes mellitus ([place]): chronic illness or injury that poses a threat to life or bodily functions Toe swelling: acute illness or injury  Amount and/or Complexity of Data Reviewed Independent Historian: EMS External Data Reviewed: notes. Radiology: ordered and independent interpretation performed. Decision-making details documented in ED Course.  Risk OTC drugs.   Imaging ordered.   Reviewed nursing notes and prior charts for additional history.   Xrays reviewed/interpreted  by me - no def fx.   Acetaminophen po.   Pt appears stable for d/c.           Final Clinical  Impression(s) / ED Diagnoses Final diagnoses:  None    Rx / DC Orders ED Discharge Orders     None         Lajean Saver, MD 08/26/22 1322

## 2022-09-06 ENCOUNTER — Ambulatory Visit (INDEPENDENT_AMBULATORY_CARE_PROVIDER_SITE_OTHER): Payer: Medicare HMO | Admitting: Physician Assistant

## 2022-09-06 VITALS — BP 115/77 | HR 57

## 2022-09-06 DIAGNOSIS — N390 Urinary tract infection, site not specified: Secondary | ICD-10-CM | POA: Diagnosis not present

## 2022-09-06 DIAGNOSIS — N3281 Overactive bladder: Secondary | ICD-10-CM | POA: Diagnosis not present

## 2022-09-06 LAB — BLADDER SCAN AMB NON-IMAGING: Scan Result: 0

## 2022-09-06 MED ORDER — MIRABEGRON ER 25 MG PO TB24: 25.0000 mg | ORAL_TABLET | Freq: Every day | ORAL | 3 refills | Status: DC

## 2022-09-06 MED ORDER — DOXYCYCLINE HYCLATE 100 MG PO CAPS: 100.0000 mg | ORAL_CAPSULE | Freq: Two times a day (BID) | ORAL | 0 refills | Status: DC

## 2022-09-06 NOTE — Progress Notes (Unsigned)
10:25 AM  post void residual =0

## 2022-09-06 NOTE — Progress Notes (Unsigned)
09/06/2022 9:54 AM   [name] 1961-03-03 [id number]   Assessment:  1. Urinary tract infection without hematuria, site unspecified - Urinalysis, Routine w reflex microscopic - BLADDER SCAN AMB NON-IMAGING - Urine Culture  2. OAB (overactive bladder)  3. Recurrent UTI    Plan: Cx ordered.  Discontinue Macrobid and start Doxy and myrbetriq. Adjust if indicated by culture results.  Follow-up in 4 to 6 weeks after ensuring current UTI has cleared for UA and PVR with possible cystoscopic exam.  Meds ordered this encounter  Medications   mirabegron ER (MYRBETRIQ) 25 MG TB24 tablet    Sig: Take 1 tablet (25 mg total) by mouth daily.    Dispense:  30 tablet    Refill:  3   doxycycline (VIBRAMYCIN) 100 MG capsule    Sig: Take 1 capsule (100 mg total) by mouth every 12 (twelve) hours.    Dispense:  14 capsule    Refill:  0        Referring provider: Bridget Hartshorn, NP [place],  [place] [address]   History of Present Illness:  [name] is a 61 y.o. year old female from [facility] with h/o colon CA, IDDM, HTN, COPD, GERD who is seen in consultation from [place], [name], NP  for evaluation of recurrent UTIs.  Pt DC from [facility] on 08/14/22 after admission for sigmoid resection.  Urine cx 12/17/21-<10K colonies, 10/27/21-Klebsiella Imaging reviewed from 5/23 indicates no stones, obstruction, hydro, or mass. Number of pads/day= patient wears diapers at all times due to fecal soiling, but states she has no urinary incontinence. Soda intake= none UA= greater than 30 WBCs, moderate bacteria, nitrite negative PVR=68m A1c= 7.2 on 08/03/22   Medical records including notes, lab results, and imaging studies reviewed during pt OV. Epic and Care Everywhere reviewed  Past Medical History:  Past Medical History:  Diagnosis Date   Anemia    Arthritis    Asthma    [place]oid [place]    COPD (chronic  obstructive pulmonary disease) ([place]    Depression    Diabetes mellitus    Diastolic dysfunction 073/71/0626  Dyspnea    Generalized weakness    Hyperlipidemia    Hypertension    Hypothyroidism    Obesity    Palpitations    Pneumonia    PONV (postoperative nausea and vomiting)    Prolonged QT interval 05/14/2015   Sleep apnea     Past Surgical History:  Past Surgical History:  Procedure Laterality Date   BIOPSY  04/06/2022   Procedure: BIOPSY;  Surgeon: CHarvel Quale MD;  [facility];  Service: Gastroenterology;;   CATARACT EXTRACTION     CESAREAN SECTION     CHOLECYSTECTOMY     COLONOSCOPY WITH PROPOFOL N/A 04/06/2022   Procedure: COLONOSCOPY WITH PROPOFOL;  Surgeon: CHarvel Quale MD;  [facility];  Service: Gastroenterology;  Laterality: N/A;  805 [place]  ESOPHAGOGASTRODUODENOSCOPY (EGD) WITH PROPOFOL N/A 04/06/2022   Procedure: ESOPHAGOGASTRODUODENOSCOPY (EGD) WITH PROPOFOL;  Surgeon: CHarvel Quale MD;  [facility];  Service: Gastroenterology;  Laterality: N/A;   HEMOSTASIS CLIP PLACEMENT  04/06/2022   Procedure: HEMOSTASIS CLIP PLACEMENT;  Surgeon: CHarvel Quale MD;  [facility];  Service: Gastroenterology;;   POLYPECTOMY  04/06/2022   Procedure: POLYPECTOMY;  Surgeon: CHarvel Quale MD;  [facility];  Service: Gastroenterology;;   [place] TATTOO INJECTION  04/06/2022   Procedure: SUBMUCOSAL TATTOO INJECTION;  Surgeon: Harvel Quale, MD;  [facility];  Service: Gastroenterology;;    Allergies:  Allergies  Allergen Reactions   Carvedilol Palpitations   Benicar [Olmesartan] Swelling   Codeine Other (See Comments)    Confusion    Sulfa Antibiotics Swelling    Whole face swells   Trulicity [Dulaglutide] Diarrhea    Family History:  Family History  Problem Relation Age of Onset   Stroke  Mother    Diabetes Father    Heart failure Father    Hypertension Father    Diabetes Sister    Heart failure Sister    Hypertension Sister    Stroke Sister    Cancer Other    Stroke Sister     Social History:  Social History   Tobacco Use   Smoking status: Never    Passive exposure: Yes   Smokeless tobacco: Never  Vaping Use   Vaping Use: Never used  Substance Use Topics   Alcohol use: No    Alcohol/week: 0.0 standard drinks of alcohol   Drug use: No    Review of symptoms:  Constitutional:  Negative for unexplained weight loss, night sweats, fever, chills ENT:  Negative for nose bleeds, sinus pain, painful swallowing CV:  Negative for chest pain, shortness of breath, exercise intolerance, palpitations, loss of consciousness Resp:  Negative for cough, wheezing, shortness of breath GI:  Negative for nausea, vomiting, diarrhea, bloody stools GU:  Positives noted in HPI; otherwise negative for gross hematuria, dysuria***, urinary incontinence Neuro:  Negative for seizures, poor balance, limb weakness, slurred speech Psych:  Negative for lack of energy, depression, anxiety Endocrine:  Negative for polydipsia, polyuria, symptoms of hypoglycemia (dizziness, hunger, sweating) Hematologic:  Negative for anemia, purpura, petechia, prolonged or excessive bleeding, use of anticoagulants***   Physical Exam: LMP 08/06/2012   Constitutional:  Alert and oriented, No acute distress. HEENT: NCAT, moist mucus membranes.  Trachea midline, no masses. Cardiovascular: Regular rate and rhythm without murmur, rub, or gallops No clubbing, cyanosis, or edema***. Respiratory: Normal respiratory effort, clear to auscultation bilaterally GI: Abdomen is soft, nontender, nondistended, no abdominal masses GU: *** BACK:  Non-tender to palpation.  No CVAT Lymph: No cervical or inguinal lymphadenopathy. Skin: No obvious rashes, warm, dry, intact Neurologic: Alert and oriented, Cranial nerves grossly  intact, no focal deficits, moving all 4 extremities***. Psychiatric: Appropriate. Normal mood and affect.  Laboratory Data: No results found for this or any previous visit (from the past 24 hour(s)).  Lab Results  Component Value Date   WBC 9.8 08/14/2022   HGB 9.1 (L) 08/14/2022   HCT 30.1 (L) 08/14/2022   MCV 74.7 (L) 08/14/2022   PLT 306 08/14/2022    Lab Results  Component Value Date   CREATININE 1.25 (H) 08/14/2022    No results found for: "PSA"  No results found for: "TESTOSTERONE"  Lab Results  Component Value Date   HGBA1C 7.2 (H) 08/03/2022    Urinalysis    Component Value Date/Time   COLORURINE YELLOW 12/17/2021 1220   APPEARANCEUR CLEAR 12/17/2021 1220   LABSPEC 1.017 12/17/2021 1220   PHURINE 5.0 12/17/2021 1220   GLUCOSEU 150 (A) 12/17/2021 1220   HGBUR NEGATIVE 12/17/2021 1220   BILIRUBINUR NEGATIVE 12/17/2021 1220   KETONESUR 5 (A) 12/17/2021 1220   PROTEINUR 100 (A) 12/17/2021 1220   UROBILINOGEN 0.2 05/15/2015 0112   NITRITE NEGATIVE 12/17/2021 1220  LEUKOCYTESUR NEGATIVE 12/17/2021 1220    Lab Results  Component Value Date   BACTERIA RARE (A) 12/17/2021    Pertinent Imaging: No results found for this or any previous visit.  Results for orders placed during the hospital encounter of 11/21/21  US Venous Img Lower Bilateral (DVT)  Narrative CLINICAL DATA:  Bilateral lower extremity chronic pain and edema  EXAM: BILATERAL LOWER EXTREMITY VENOUS DOPPLER ULTRASOUND  TECHNIQUE: Gray-scale sonography with graded compression, as well as color Doppler and duplex ultrasound were performed to evaluate the lower extremity deep venous systems from the level of the common femoral vein and including the common femoral, femoral, profunda femoral, popliteal and calf veins including the posterior tibial, peroneal and gastrocnemius veins when visible. The superficial great saphenous vein was also interrogated. Spectral Doppler was utilized to  evaluate flow at rest and with distal augmentation maneuvers in the common femoral, femoral and popliteal veins.  COMPARISON:  None.  FINDINGS: RIGHT LOWER EXTREMITY  Common Femoral Vein: No evidence of thrombus. Normal compressibility, respiratory phasicity and response to augmentation.  Saphenofemoral Junction: No evidence of thrombus. Normal compressibility and flow on color Doppler imaging.  Profunda Femoral Vein: No evidence of thrombus. Normal compressibility and flow on color Doppler imaging.  Femoral Vein: No evidence of thrombus. Normal compressibility, respiratory phasicity and response to augmentation.  Popliteal Vein: No evidence of thrombus. Normal compressibility, respiratory phasicity and response to augmentation.  Calf Veins: No evidence of thrombus. Normal compressibility and flow on color Doppler imaging.  Other Findings: Right popliteal fossa minimally complex thick-walled Baker's cyst measures 4.5 x 1.4 x 2.9 cm  LEFT LOWER EXTREMITY  Common Femoral Vein: No evidence of thrombus. Normal compressibility, respiratory phasicity and response to augmentation.  Saphenofemoral Junction: No evidence of thrombus. Normal compressibility and flow on color Doppler imaging.  Profunda Femoral Vein: No evidence of thrombus. Normal compressibility and flow on color Doppler imaging.  Femoral Vein: No evidence of thrombus. Normal compressibility, respiratory phasicity and response to augmentation.  Popliteal Vein: No evidence of thrombus. Normal compressibility, respiratory phasicity and response to augmentation.  Calf Veins: No evidence of thrombus. Normal compressibility and flow on color Doppler imaging.  IMPRESSION: No significant DVT demonstrated in either extremity. Limited exam because of obesity.  4.5 cm right knee Baker's cyst.   Electronically Signed By: Jerilynn Mages.  Shick M.D. On: 11/23/2021 14:10     [place], Berneice Heinrich, PA-C [facility]  Urology [place]

## 2022-09-07 LAB — URINALYSIS, ROUTINE W REFLEX MICROSCOPIC
Bilirubin, UA: NEGATIVE
Glucose, UA: NEGATIVE
Ketones, UA: NEGATIVE
Nitrite, UA: NEGATIVE
Specific Gravity, UA: 1.015 (ref 1.005–1.030)
Urobilinogen, Ur: 0.2 mg/dL (ref 0.2–1.0)
pH, UA: 5.5 (ref 5.0–7.5)

## 2022-09-07 LAB — MICROSCOPIC EXAMINATION: WBC, UA: >30 /hpf — AB (ref 0–5)

## 2022-09-08 ENCOUNTER — Inpatient Hospital Stay: Payer: Medicare HMO | Attending: Hematology | Admitting: Hematology

## 2022-09-08 VITALS — BP 142/79 | HR 64 | Temp 98.0°F | Resp 18 | Ht 61.0 in | Wt 263.4 lb

## 2022-09-08 DIAGNOSIS — Z794 Long term (current) use of insulin: Secondary | ICD-10-CM | POA: Diagnosis not present

## 2022-09-08 DIAGNOSIS — I10 Essential (primary) hypertension: Secondary | ICD-10-CM | POA: Diagnosis not present

## 2022-09-08 DIAGNOSIS — C19 Malignant neoplasm of rectosigmoid junction: Secondary | ICD-10-CM | POA: Insufficient documentation

## 2022-09-08 DIAGNOSIS — C187 Malignant neoplasm of sigmoid colon: Secondary | ICD-10-CM | POA: Diagnosis not present

## 2022-09-08 DIAGNOSIS — Z79899 Other long term (current) drug therapy: Secondary | ICD-10-CM | POA: Insufficient documentation

## 2022-09-08 DIAGNOSIS — D509 Iron deficiency anemia, unspecified: Secondary | ICD-10-CM | POA: Diagnosis not present

## 2022-09-08 DIAGNOSIS — E119 Type 2 diabetes mellitus without complications: Secondary | ICD-10-CM | POA: Insufficient documentation

## 2022-09-08 DIAGNOSIS — E039 Hypothyroidism, unspecified: Secondary | ICD-10-CM | POA: Insufficient documentation

## 2022-09-08 NOTE — Progress Notes (Signed)
[place], [place] [address]   CLINIC:  Medical Oncology/Hematology  PCP:  Bridget Hartshorn, NP [place] [address] [place] [address] [phone]   REASON FOR VISIT:  Follow-up for stage II rectosigmoid cancer  PRIOR THERAPY: Low anterior resection on 08/11/2022  NGS Results: MMR-preserved  CURRENT THERAPY: Surveillance  BRIEF ONCOLOGIC HISTORY:  Oncology History  Cancer of sigmoid colon ([place])  05/12/2022 Initial Diagnosis   Cancer of sigmoid colon ([place])   05/12/2022 Cancer Staging   Staging form: Colon and Rectum, AJCC 8th Edition - Clinical stage from 05/12/2022: Stage IIA (cT3, cN0, cM0) - Signed by Derek Jack, MD on 09/08/2022 Histopathologic type: Adenocarcinoma, NOS Total positive nodes: 0 Histologic grade (G): G2 Histologic grading system: 4 grade system     CANCER STAGING:  Cancer Staging  Cancer of sigmoid colon [facility]) Staging form: Colon and Rectum, AJCC 8th Edition - Clinical stage from 05/12/2022: Stage IIA (cT3, cN0, cM0) - Signed by Derek Jack, MD on 09/08/2022    INTERVAL HISTORY:  [name] 62 y.o. female seen for follow-up of low anterior resection.  She is still residing at [facility] rehab facility.  Her husband is with her today.  She has recovered well from surgery.  She still has difficulty walking from foot drop.    REVIEW OF SYSTEMS:  Review of Systems  Respiratory:  Positive for cough and shortness of breath.   Gastrointestinal:  Positive for constipation.  Neurological:  Positive for numbness (Hands and feet).  All other systems reviewed and are negative.    PAST MEDICAL/SURGICAL HISTORY:  Past Medical History:  Diagnosis Date   Anemia    Arthritis    Asthma    Cancer of sigmoid ([place])    COPD (chronic obstructive pulmonary disease) ([place])    Depression    Diabetes mellitus    Diastolic dysfunction 21/30/8657   Dyspnea    Generalized weakness     Hyperlipidemia    Hypertension    Hypothyroidism    Obesity    Palpitations    Pneumonia    PONV (postoperative nausea and vomiting)    Prolonged QT interval 05/14/2015   Sleep apnea    Past Surgical History:  Procedure Laterality Date   BIOPSY  04/06/2022   Procedure: BIOPSY;  Surgeon: Harvel Quale, MD;  [facility];  Service: Gastroenterology;;   CATARACT EXTRACTION     CESAREAN SECTION     CHOLECYSTECTOMY     COLONOSCOPY WITH PROPOFOL N/A 04/06/2022   Procedure: COLONOSCOPY WITH PROPOFOL;  Surgeon: Harvel Quale, MD;  [facility];  Service: Gastroenterology;  Laterality: N/A;  805 ASA 2 patient in [place]   ESOPHAGOGASTRODUODENOSCOPY (EGD) WITH PROPOFOL N/A 04/06/2022   Procedure: ESOPHAGOGASTRODUODENOSCOPY (EGD) WITH PROPOFOL;  Surgeon: Harvel Quale, MD;  [facility];  Service: Gastroenterology;  Laterality: N/A;   HEMOSTASIS CLIP PLACEMENT  04/06/2022   Procedure: HEMOSTASIS CLIP PLACEMENT;  Surgeon: Harvel Quale, MD;  [facility];  Service: Gastroenterology;;   POLYPECTOMY  04/06/2022   Procedure: POLYPECTOMY;  Surgeon: Harvel Quale, MD;  [facility];  Service: Gastroenterology;;   SUBMUCOSAL TATTOO INJECTION  04/06/2022   Procedure: SUBMUCOSAL TATTOO INJECTION;  Surgeon: Harvel Quale, MD;  [facility];  Service: Gastroenterology;;     SOCIAL HISTORY:  Social History   Socioeconomic History   Marital status: Married  Spouse name: Not on file   Number of children: Not on file   Years of education: Not on file   Highest education level: Not on file  Occupational History   Not on file  Tobacco Use   Smoking status: Never    Passive exposure: Yes   Smokeless tobacco: Never  Vaping Use   Vaping Use: Never used  Substance and Sexual Activity   Alcohol use: No    Alcohol/week: 0.0 standard drinks of alcohol    Drug use: No   Sexual activity: Yes    Birth control/protection: None  Other Topics Concern   Not on file  Social History Narrative   Lives with husband.  One living son.  Daughter died after transplant age 72.     Social Determinants of Health   Financial Resource Strain: Not on file  Food Insecurity: Unknown (08/12/2022)   Hunger Vital Sign    Worried About Running Out of Food in the Last Year: Patient refused    [place] in the Last Year: Patient refused  Transportation Needs: No Transportation Needs (08/12/2022)   PRAPARE - Hydrologist (Medical): No    Lack of Transportation (Non-Medical): No  Physical Activity: Not on file  Stress: Not on file  Social Connections: Not on file  Intimate Partner Violence: Unknown (08/12/2022)   Humiliation, Afraid, Rape, and Kick questionnaire    Fear of Current or Ex-Partner: No    Emotionally Abused: No    Physically Abused: Not on file    Sexually Abused: Not on file    FAMILY HISTORY:  Family History  Problem Relation Age of Onset   Stroke Mother    Diabetes Father    Heart failure Father    Hypertension Father    Diabetes Sister    Heart failure Sister    Hypertension Sister    Stroke Sister    Cancer Other    Stroke Sister     CURRENT MEDICATIONS:  Outpatient Encounter Medications as of 09/08/2022  Medication Sig Note   ADMELOG 100 UNIT/ML injection Inject 4-8 Units into the skin 3 (three) times daily with meals. If blood glucose is 200-250= 4 units, 250-300= 5 units, 301-350= 6 units, 351-400= 8 units, > 400 notify MD    albuterol (VENTOLIN HFA) 108 (90 Base) MCG/ACT inhaler Inhale 2 puffs into the lungs every 6 (six) hours as needed for wheezing or shortness of breath.    alum & mag hydroxide-simeth (MAALOX/MYLANTA) 200-200-20 MG/5ML suspension Take 30 mLs by mouth every 2 (two) hours as needed for indigestion or heartburn. Do not exceed 4 doses in 24 hours    amLODipine (NORVASC) 10 MG tablet  Take 10 mg by mouth daily.    ascorbic acid (VITAMIN C) 500 MG tablet Take 500 mg by mouth daily.    atorvastatin (LIPITOR) 10 MG tablet Take 10 mg by mouth at bedtime.    Cholecalciferol (VITAMIN D) 50 MCG (2000 UT) CAPS Take 2,000 Units by mouth daily.    doxycycline (VIBRAMYCIN) 100 MG capsule Take 1 capsule (100 mg total) by mouth every 12 (twelve) hours.    Emollient (EUCERIN) lotion Apply 1 Application topically every 12 (twelve) hours as needed for dry skin.    ferrous sulfate 325 (65 FE) MG tablet Take 325 mg by mouth daily.    fosfomycin (MONUROL) 3 g PACK Take 3 g by mouth every 3 (three) days.    furosemide (LASIX) 20 MG tablet  Take 20 mg by mouth daily as needed for edema.    gabapentin (NEURONTIN) 100 MG capsule Take 1 capsule (100 mg total) by mouth 3 (three) times daily.    Glucerna (GLUCERNA) LIQD Take 237 mLs by mouth daily. For wound healing    hydrALAZINE (APRESOLINE) 25 MG tablet Take 1 tablet (25 mg total) by mouth every 8 (eight) hours.    hydrOXYzine (ATARAX/VISTARIL) 10 MG tablet Take 10 mg by mouth every 8 (eight) hours as needed for itching or anxiety.    insulin glargine-yfgn (SEMGLEE) 100 UNIT/ML injection Inject 0.28 mLs (28 Units total) into the skin daily.    LANTUS 100 UNIT/ML injection Inject 30 Units into the skin at bedtime. 08/12/2022: MAR indicates the patient is receiving Semglee in the AM and Lantus in the PM   levothyroxine (SYNTHROID) 88 MCG tablet Take 88 mcg by mouth daily before breakfast.    loperamide (IMODIUM A-D) 2 MG tablet Take 2 mg by mouth as needed for diarrhea or loose stools. Do not exceed 4 per day    magnesium oxide (MAG-OX) 400 (240 Mg) MG tablet Take 400 mg by mouth daily.    Menthol-Zinc Oxide (CALMOSEPTINE) 0.44-20.6 % OINT Apply 1 Application topically as needed (preservation/protection after incontinent care).    mirabegron ER (MYRBETRIQ) 25 MG TB24 tablet Take 1 tablet (25 mg total) by mouth daily.    Nystatin (GERHARDT'S BUTT CREAM)  CREA As directed    nystatin (MYCOSTATIN/NYSTOP) powder Apply topically 2 (two) times daily.    omeprazole (PRILOSEC) 20 MG capsule Take 20 mg by mouth in the morning and at bedtime.    potassium chloride SA (K-DUR) 20 MEQ tablet Take 20 mEq by mouth daily.    PROCRIT 4000 UNIT/ML injection Inject 4,000 Units into the skin every Monday, Wednesday, and Friday. Hold if hemoglobin is > 10    propranolol (INDERAL) 80 MG tablet Take 80 mg by mouth daily.    sertraline (ZOLOFT) 50 MG tablet Take 3 tablets (150 mg total) by mouth daily.    No facility-administered encounter medications on file as of 09/08/2022.    ALLERGIES:  Allergies  Allergen Reactions   Carvedilol Palpitations   Benicar [Olmesartan] Swelling   Codeine Other (See Comments)    Confusion    Sulfa Antibiotics Swelling    Whole face swells   Trulicity [Dulaglutide] Diarrhea     PHYSICAL EXAM:  ECOG Performance status: 2  Vitals:   09/08/22 1407  BP: (!) 142/79  Pulse: 64  Resp: 18  Temp: 98 F (36.7 C)  SpO2: 92%   Filed Weights   09/08/22 1407  Weight: 263 lb 6.4 oz (119.5 kg)   Physical Exam Vitals reviewed.  Constitutional:      Appearance: Normal appearance.  Cardiovascular:     Rate and Rhythm: Normal rate and regular rhythm.     Heart sounds: Normal heart sounds.  Pulmonary:     Breath sounds: Normal breath sounds.  Neurological:     Mental Status: She is alert.  Psychiatric:        Mood and Affect: Mood normal.        Behavior: Behavior normal.      LABORATORY DATA:  I have reviewed the labs as listed.  CBC    Component Value Date/Time   WBC 9.8 08/14/2022 0547   RBC 4.03 08/14/2022 0547   HGB 9.1 (L) 08/14/2022 0547   HCT 30.1 (L) 08/14/2022 0547   PLT 306 08/14/2022 0547   MCV  74.7 (L) 08/14/2022 0547   MCH 22.6 (L) 08/14/2022 0547   MCHC 30.2 08/14/2022 0547   RDW 23.8 (H) 08/14/2022 0547   LYMPHSABS 1.6 04/01/2022 1032   MONOABS 0.5 04/01/2022 1032   EOSABS 0.5 04/01/2022  1032   BASOSABS 0.1 04/01/2022 1032      Latest Ref Rng & Units 08/14/2022    5:47 AM 08/13/2022    4:52 AM 08/12/2022    7:41 PM  CMP  Glucose 70 - 99 mg/dL 185  117  240   BUN 8 - 23 mg/dL '17  21  25   ' Creatinine 0.44 - 1.00 mg/dL 1.25  1.28  1.38   Sodium 135 - 145 mmol/L 139  139  137   Potassium 3.5 - 5.1 mmol/L 3.4  3.9  4.1   Chloride 98 - 111 mmol/L 107  107  106   CO2 22 - 32 mmol/L '27  26  25   ' Calcium 8.9 - 10.3 mg/dL 9.1  8.9  8.9     DIAGNOSTIC IMAGING:  I have independently reviewed the scans and discussed with the patient.  ASSESSMENT:  1.  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  Rectal mass biopsy consistent with moderately differentiated adenocarcinoma. - CEA (04/06/2022): 25.7. - CT CAP (04/27/2022): No evidence of mass, lymphadenopathy or metastatic disease in the chest, abdomen or pelvis.  Clustered subsolid nodular opacities in the superior segment of the left lower lobe measures 0.6 cm. - Rectal MRI (05/10/2022): Lesion is in the sigmoid colon measuring 4 cm with findings for small volume extension into sigmoid mesocolon.  Intermittent association with adjacent uterus without gross invasion.  Sigmoid mesocolon node, borderline size.  Right external iliac nodes are favored to be reactive. - Low anterior resection (08/11/2022) by Dr. Marcello Moores - Pathology: Moderately differentiated adenocarcinoma, margins negative.  Site of tumor is rectosigmoid junction.  0/14 lymph nodes involved.  Negative LVI and PNI.  PT3 pN0.  MMR preserved.  2.  Social/family history: - She is a resident at [facility] home for the past few months due to falls from right foot drop and numbness in the right hand, right foot and lower leg.  Previously she worked at the nursing home.  She is a non-smoker. - Maternal grandmother had colon cancer.   PLAN:  1.  Stage  II (T3 N0 M0) rectosigmoid junction adenocarcinoma, MMR preserved: - We have reviewed pathology report with the patient and her husband in detail. - Adjuvant therapy is not indicated.  We discussed surveillance plan with follow-up visits every 3 months with CEA level, CT scan every 6 months for the first 2 years. - Her last CT scan was in May.  I have recommended another CT CAP with contrast next month in 4 to 6 weeks.  We will also repeat CEA which was elevated prior to her surgery in May.  2.  Microcytic anemia: - Last hemoglobin was 9.1 on 08/14/2022 with MCV 74. - We will check ferritin and iron panel along with CBC at next visit.   Orders placed this encounter:  Orders Placed This Encounter  Procedures   CT CHEST ABDOMEN PELVIS W CONTRAST   CBC with Differential/Platelet   Comprehensive metabolic panel   Magnesium   Ferritin   Iron and TIBC   CEA      Derek Jack, MD [place] [phone]

## 2022-09-08 NOTE — Patient Instructions (Addendum)
[facility]  Discharge Instructions  You were seen and examined today by Dr. Delton Coombes.  Dr. Delton Coombes discussed your most recent labs and you were slightly anemic. Dr. Delton Coombes is going to recheck labs and a scan before your next appointment.  Follow-up as scheduled in 4-6 weeks after labs and scan.    Thank you for choosing [facility] to provide your oncology and hematology care.   To afford each patient quality time with our provider, please arrive at least 15 minutes before your scheduled appointment time. You may need to reschedule your appointment if you arrive late (10 or more minutes). Arriving late affects you and other patients whose appointments are after yours.  Also, if you miss three or more appointments without notifying the office, you may be dismissed from the clinic at the provider's discretion.    Again, thank you for choosing [facility].  Our hope is that these requests will decrease the amount of time that you wait before being seen by our physicians.   If you have a lab appointment with the [facility] please come in thru the Main Entrance and check in at the main information desk.           _____________________________________________________________  Should you have questions after your visit to [facility], please contact our office at [phone] and follow the prompts.  Our office hours are 8:00 a.m. to 4:30 p.m. Monday - Thursday and 8:00 a.m. to 2:30 p.m. Friday.  Please note that voicemails left after 4:00 p.m. may not be returned until the following business day.  We are closed weekends and all major holidays.  You do have access to a nurse 24-7, just call the main number to the clinic [phone] and do not press any options, hold on the line and a nurse will answer the phone.    For prescription refill requests, have your pharmacy contact our office and allow 72  hours.    Masks are optional in the cancer centers. If you would like for your care team to wear a mask while they are taking care of you, please let them know. You may have one support person who is at least 61 years old accompany you for your appointments.

## 2022-09-09 ENCOUNTER — Telehealth: Payer: Self-pay

## 2022-09-09 LAB — URINE CULTURE

## 2022-09-09 NOTE — Telephone Encounter (Signed)
[facility] nursing facility and spoke with Mrs.Krall's nurse Caryl Pina. Made nurse Caryl Pina aware that patient culture grew resistant Klebsiella and patient needs a different antibx. Gave verbal order per Sharee Pimple for patient to stop current Rx Doxycyline and start Augmentin 875 BID for 7 days and have facility recheck UA in 2 weeks. Nurse Caryl Pina voiced understanding.

## 2022-09-09 NOTE — Telephone Encounter (Signed)
-----   Message from Reynaldo Minium, [place] sent at 09/09/2022  9:01 AM EDT ----- Please contact [facility] and let pt's nurse know pt's culture grew resistant Klebsiella and pt needs different antibx. If they will take a verbal order-GREAT! Pt needs Augmentin 875 BID for 7 days. Stop current Rx for Doxycycline. Recheck UA in 2 weeks at facility  Thanks!! ----- Message ----- From: Lavone Neri Lab Results In Sent: 09/07/2022   5:37 AM EDT To: Reynaldo Minium, PA-C

## 2022-09-20 DIAGNOSIS — H903 Sensorineural hearing loss, bilateral: Secondary | ICD-10-CM | POA: Insufficient documentation

## 2022-09-20 DIAGNOSIS — G63 Polyneuropathy in diseases classified elsewhere: Secondary | ICD-10-CM | POA: Insufficient documentation

## 2022-09-22 ENCOUNTER — Encounter: Payer: Self-pay | Admitting: Neurology

## 2022-10-06 ENCOUNTER — Ambulatory Visit (INDEPENDENT_AMBULATORY_CARE_PROVIDER_SITE_OTHER): Payer: Medicare HMO | Admitting: Urology

## 2022-10-06 ENCOUNTER — Encounter: Payer: Self-pay | Admitting: Urology

## 2022-10-06 VITALS — BP 126/76 | HR 61

## 2022-10-06 DIAGNOSIS — N3281 Overactive bladder: Secondary | ICD-10-CM | POA: Diagnosis not present

## 2022-10-06 DIAGNOSIS — N390 Urinary tract infection, site not specified: Secondary | ICD-10-CM | POA: Diagnosis not present

## 2022-10-06 LAB — URINALYSIS, ROUTINE W REFLEX MICROSCOPIC
Bilirubin, UA: NEGATIVE
Glucose, UA: NEGATIVE
Ketones, UA: NEGATIVE
Nitrite, UA: NEGATIVE
Specific Gravity, UA: 1.015 (ref 1.005–1.030)
Urobilinogen, Ur: 0.2 mg/dL (ref 0.2–1.0)
pH, UA: 5 (ref 5.0–7.5)

## 2022-10-06 LAB — MICROSCOPIC EXAMINATION

## 2022-10-06 LAB — BLADDER SCAN AMB NON-IMAGING: Scan Result: 0

## 2022-10-06 NOTE — Progress Notes (Signed)
10/06/2022 2:49 PM   [name] 01/15/1961 [id number]  Referring provider: Bridget Hartshorn, NP [place],  [place] [address]  OAB and recurrent UTI   HPI: [name] is a 61yo here for followup for recurrent UTI and OAB. She finished course of augmentin over 1 week ago. UA today shows 1+ leuks and trace blood. She notes painful bladder spasms prior to the urge to urinate. No urinary frequency or nocturia. No dysuria. No hematuria. Her blood sugars are 120-170. No other complaints today.    PMH: Past Medical History:  Diagnosis Date   Anemia    Arthritis    Asthma    Cancer of sigmoid ([place])    COPD (chronic obstructive pulmonary disease) ([place])    Depression    Diabetes mellitus    Diastolic dysfunction 32/99/2426   Dyspnea    Generalized weakness    Hyperlipidemia    Hypertension    Hypothyroidism    Obesity    Palpitations    Pneumonia    PONV (postoperative nausea and vomiting)    Prolonged QT interval 05/14/2015   Sleep apnea     Surgical History: Past Surgical History:  Procedure Laterality Date   BIOPSY  04/06/2022   Procedure: BIOPSY;  Surgeon: Harvel Quale, MD;  [facility];  Service: Gastroenterology;;   CATARACT EXTRACTION     CESAREAN SECTION     CHOLECYSTECTOMY     COLONOSCOPY WITH PROPOFOL N/A 04/06/2022   Procedure: COLONOSCOPY WITH PROPOFOL;  Surgeon: Harvel Quale, MD;  [facility];  Service: Gastroenterology;  Laterality: N/A;  805 ASA 2 patient in [place]   ESOPHAGOGASTRODUODENOSCOPY (EGD) WITH PROPOFOL N/A 04/06/2022   Procedure: ESOPHAGOGASTRODUODENOSCOPY (EGD) WITH PROPOFOL;  Surgeon: Harvel Quale, MD;  [facility];  Service: Gastroenterology;  Laterality: N/A;   HEMOSTASIS CLIP PLACEMENT  04/06/2022   Procedure: HEMOSTASIS CLIP PLACEMENT;  Surgeon: Harvel Quale, MD;  [facility];   Service: Gastroenterology;;   POLYPECTOMY  04/06/2022   Procedure: POLYPECTOMY;  Surgeon: Harvel Quale, MD;  [facility];  Service: Gastroenterology;;   SUBMUCOSAL TATTOO INJECTION  04/06/2022   Procedure: SUBMUCOSAL TATTOO INJECTION;  Surgeon: Harvel Quale, MD;  [facility];  Service: Gastroenterology;;    Home Medications:  Allergies as of 10/06/2022       Reactions   Carvedilol Palpitations   Benicar [olmesartan] Swelling   Codeine Other (See Comments)   Confusion    Sulfa Antibiotics Swelling   Whole face swells   Trulicity [dulaglutide] Diarrhea        Medication List        Accurate as of October 06, 2022  2:49 PM. If you have any questions, ask your nurse or doctor.          Admelog 100 UNIT/ML injection Generic drug: insulin lispro Inject 4-8 Units into the skin 3 (three) times daily with meals. If blood glucose is 200-250= 4 units, 250-300= 5 units, 301-350= 6 units, 351-400= 8 units, > 400 notify MD   albuterol 108 (90 Base) MCG/ACT inhaler Commonly known as: VENTOLIN HFA Inhale 2 puffs into the lungs every 6 (six) hours as needed for wheezing or shortness of breath.   alum & mag hydroxide-simeth 200-200-20 MG/5ML suspension Commonly known as: MAALOX/MYLANTA Take 30 mLs by mouth every 2 (two) hours as needed for indigestion or heartburn. Do not exceed 4 doses  in 24 hours   amLODipine 10 MG tablet Commonly known as: NORVASC Take 10 mg by mouth daily.   ascorbic acid 500 MG tablet Commonly known as: VITAMIN C Take 500 mg by mouth daily.   atorvastatin 10 MG tablet Commonly known as: LIPITOR Take 10 mg by mouth at bedtime.   Calmoseptine 0.44-20.6 % Oint Generic drug: Menthol-Zinc Oxide Apply 1 Application topically as needed (preservation/protection after incontinent care).   doxycycline 100 MG capsule Commonly known as: VIBRAMYCIN Take 1 capsule (100 mg total) by mouth every 12 (twelve) hours.    eucerin lotion Apply 1 Application topically every 12 (twelve) hours as needed for dry skin.   ferrous sulfate 325 (65 FE) MG tablet Take 325 mg by mouth daily.   fosfomycin 3 g Pack Commonly known as: MONUROL Take 3 g by mouth every 3 (three) days.   furosemide 20 MG tablet Commonly known as: LASIX Take 20 mg by mouth daily as needed for edema.   gabapentin 100 MG capsule Commonly known as: NEURONTIN Take 1 capsule (100 mg total) by mouth 3 (three) times daily.   Gerhardt's butt cream Crea As directed   Glucerna Liqd Take 237 mLs by mouth daily. For wound healing   hydrALAZINE 25 MG tablet Commonly known as: APRESOLINE Take 1 tablet (25 mg total) by mouth every 8 (eight) hours.   hydrOXYzine 10 MG tablet Commonly known as: ATARAX Take 10 mg by mouth every 8 (eight) hours as needed for itching or anxiety.   insulin glargine-yfgn 100 UNIT/ML injection Commonly known as: SEMGLEE Inject 0.28 mLs (28 Units total) into the skin daily.   Lantus 100 UNIT/ML injection Generic drug: insulin glargine Inject 30 Units into the skin at bedtime.   levothyroxine 88 MCG tablet Commonly known as: SYNTHROID Take 88 mcg by mouth daily before breakfast.   loperamide 2 MG tablet Commonly known as: IMODIUM A-D Take 2 mg by mouth as needed for diarrhea or loose stools. Do not exceed 4 per day   magnesium oxide 400 (240 Mg) MG tablet Commonly known as: MAG-OX Take 400 mg by mouth daily.   mirabegron ER 25 MG Tb24 tablet Commonly known as: MYRBETRIQ Take 1 tablet (25 mg total) by mouth daily.   nystatin powder Commonly known as: MYCOSTATIN/NYSTOP Apply topically 2 (two) times daily.   omeprazole 20 MG capsule Commonly known as: PRILOSEC Take 20 mg by mouth in the morning and at bedtime.   potassium chloride SA 20 MEQ tablet Commonly known as: KLOR-CON M Take 20 mEq by mouth daily.   Procrit 4000 UNIT/ML injection Generic drug: epoetin alfa Inject 4,000 Units into the  skin every Monday, Wednesday, and Friday. Hold if hemoglobin is > 10   propranolol 80 MG tablet Commonly known as: INDERAL Take 80 mg by mouth daily.   sertraline 50 MG tablet Commonly known as: ZOLOFT Take 3 tablets (150 mg total) by mouth daily.   Vitamin D 50 MCG (2000 UT) Caps Take 2,000 Units by mouth daily.        Allergies:  Allergies  Allergen Reactions   Carvedilol Palpitations   Benicar [Olmesartan] Swelling   Codeine Other (See Comments)    Confusion    Sulfa Antibiotics Swelling    Whole face swells   Trulicity [Dulaglutide] Diarrhea    Family History: Family History  Problem Relation Age of Onset   Stroke Mother    Diabetes Father    Heart failure Father    Hypertension Father  Diabetes Sister    Heart failure Sister    Hypertension Sister    Stroke Sister    Cancer Other    Stroke Sister     Social History:  reports that she has never smoked. She has been exposed to tobacco smoke. She has never used smokeless tobacco. She reports that she does not drink alcohol and does not use drugs.  ROS: All other review of systems were reviewed and are negative except what is noted above in HPI  Physical Exam: BP 126/76   Pulse 61   LMP 08/06/2012   Constitutional:  Alert and oriented, No acute distress. HEENT: [place] AT, moist mucus membranes.  Trachea midline, no masses. Cardiovascular: No clubbing, cyanosis, or edema. Respiratory: Normal respiratory effort, no increased work of breathing. GI: Abdomen is soft, nontender, nondistended, no abdominal masses GU: No CVA tenderness.  Lymph: No cervical or inguinal lymphadenopathy. Skin: No rashes, bruises or suspicious lesions. Neurologic: Grossly intact, no focal deficits, moving all 4 extremities. Psychiatric: Normal mood and affect.  Laboratory Data: Lab Results  Component Value Date   WBC 9.8 08/14/2022   HGB 9.1 (L) 08/14/2022   HCT 30.1 (L) 08/14/2022   MCV 74.7 (L) 08/14/2022   PLT 306  08/14/2022    Lab Results  Component Value Date   CREATININE 1.25 (H) 08/14/2022    No results found for: "PSA"  No results found for: "TESTOSTERONE"  Lab Results  Component Value Date   HGBA1C 7.2 (H) 08/03/2022    Urinalysis    Component Value Date/Time   COLORURINE YELLOW 12/17/2021 1220   APPEARANCEUR Cloudy (A) 09/06/2022 1058   LABSPEC 1.017 12/17/2021 1220   PHURINE 5.0 12/17/2021 1220   GLUCOSEU Negative 09/06/2022 1058   HGBUR NEGATIVE 12/17/2021 1220   BILIRUBINUR Negative 09/06/2022 1058   KETONESUR 5 (A) 12/17/2021 1220   PROTEINUR 2+ (A) 09/06/2022 1058   PROTEINUR 100 (A) 12/17/2021 1220   UROBILINOGEN 0.2 05/15/2015 0112   NITRITE Negative 09/06/2022 1058   NITRITE NEGATIVE 12/17/2021 1220   LEUKOCYTESUR 2+ (A) 09/06/2022 1058   LEUKOCYTESUR NEGATIVE 12/17/2021 1220    Lab Results  Component Value Date   LABMICR See below: 09/06/2022   WBCUA >30 (A) 09/06/2022   LABEPIT 0-10 09/06/2022   BACTERIA Moderate (A) 09/06/2022    Pertinent Imaging:  No results found for this or any previous visit.  Results for orders placed during the hospital encounter of 11/21/21  US Venous Img Lower Bilateral (DVT)  Narrative CLINICAL DATA:  Bilateral lower extremity chronic pain and edema  EXAM: BILATERAL LOWER EXTREMITY VENOUS DOPPLER ULTRASOUND  TECHNIQUE: Gray-scale sonography with graded compression, as well as color Doppler and duplex ultrasound were performed to evaluate the lower extremity deep venous systems from the level of the common femoral vein and including the common femoral, femoral, profunda femoral, popliteal and calf veins including the posterior tibial, peroneal and gastrocnemius veins when visible. The superficial great saphenous vein was also interrogated. Spectral Doppler was utilized to evaluate flow at rest and with distal augmentation maneuvers in the common femoral, femoral and popliteal veins.  COMPARISON:   None.  FINDINGS: RIGHT LOWER EXTREMITY  Common Femoral Vein: No evidence of thrombus. Normal compressibility, respiratory phasicity and response to augmentation.  Saphenofemoral Junction: No evidence of thrombus. Normal compressibility and flow on color Doppler imaging.  Profunda Femoral Vein: No evidence of thrombus. Normal compressibility and flow on color Doppler imaging.  Femoral Vein: No evidence of thrombus. Normal compressibility, respiratory phasicity  and response to augmentation.  Popliteal Vein: No evidence of thrombus. Normal compressibility, respiratory phasicity and response to augmentation.  Calf Veins: No evidence of thrombus. Normal compressibility and flow on color Doppler imaging.  Other Findings: Right popliteal fossa minimally complex thick-walled Baker's cyst measures 4.5 x 1.4 x 2.9 cm  LEFT LOWER EXTREMITY  Common Femoral Vein: No evidence of thrombus. Normal compressibility, respiratory phasicity and response to augmentation.  Saphenofemoral Junction: No evidence of thrombus. Normal compressibility and flow on color Doppler imaging.  Profunda Femoral Vein: No evidence of thrombus. Normal compressibility and flow on color Doppler imaging.  Femoral Vein: No evidence of thrombus. Normal compressibility, respiratory phasicity and response to augmentation.  Popliteal Vein: No evidence of thrombus. Normal compressibility, respiratory phasicity and response to augmentation.  Calf Veins: No evidence of thrombus. Normal compressibility and flow on color Doppler imaging.  IMPRESSION: No significant DVT demonstrated in either extremity. Limited exam because of obesity.  4.5 cm right knee Baker's cyst.   Electronically Signed By: Jerilynn Mages.  Shick M.D. On: 11/23/2021 14:10  No results found for this or any previous visit.  No results found for this or any previous visit.  No results found for this or any previous visit.  No valid procedures  specified. No results found for this or any previous visit.  Results for orders placed during the hospital encounter of 09/23/17  CT Renal Stone Study  Narrative CLINICAL DATA:  Dizziness since last night. Intermittent generalized abdominal pain. Bilateral lower and umbilical abdominal pain. Nausea.  EXAM: CT ABDOMEN AND PELVIS WITHOUT CONTRAST  TECHNIQUE: Multidetector CT imaging of the abdomen and pelvis was performed following the standard protocol without IV contrast.  COMPARISON:  02/10/2012  FINDINGS: Lower chest: The lung bases are clear. Small amount of residual contrast material or opaque medication in the esophagus may indicate reflux or dysmotility.  Hepatobiliary: Hepatomegaly. Wedge-shaped hypodense lesions demonstrated throughout the liver. This pattern is increasing since the previous study. Appearance is suspicious for metastatic disease although heterogeneous fatty infiltration or infarcts could also have this appearance. Suggest follow-up evaluation with elective MRI. Surgical absence of the gallbladder. No bile duct dilatation.  Pancreas: Pancreas is atrophic but otherwise appears normal.  Spleen: Normal in size without focal abnormality.  Adrenals/Urinary Tract: No adrenal gland nodules. Kidneys are symmetrical in size. No hydronephrosis or hydroureter. Small cyst on the right kidney. No renal, ureteral, or bladder stones. Bladder wall is not thickened.  Stomach/Bowel: Stomach, small bowel, and colon are mostly decompressed. Scattered stool in the colon. No inflammatory changes are suggested. Appendix is not identified.  Vascular/Lymphatic: Aortic atherosclerosis. No enlarged abdominal or pelvic lymph nodes.  Reproductive: Uterus and bilateral adnexa are unremarkable.  Other: No abdominal wall hernia or abnormality. No abdominopelvic ascites.  Musculoskeletal: No acute or significant osseous findings.  IMPRESSION: 1. Hepatomegaly with  wedge-shaped hypodense lesions demonstrated throughout the liver. Differential diagnosis includes metastatic disease, heterogeneous fatty infiltration, or infarcts. Suggest follow-up evaluation with elective MRI for further characterization. 2. No renal or ureteral stone or obstruction. 3. No bowel obstruction or inflammation. 4. Aortic atherosclerosis.   Electronically Signed By: Lucienne Capers M.D. On: 09/23/2017 21:56   Assessment & Plan:    1. OAB (overactive bladder) -Increase mirabegron to '50mg'$  daily - BLADDER SCAN AMB NON-IMAGING  2. Recurrent UTI -urine for culture - Urinalysis, Routine w reflex microscopic - Urine Culture   No follow-ups on file.  Nicolette Bang, MD  [facility] [place]

## 2022-10-06 NOTE — Progress Notes (Signed)
post void residual=0 ?

## 2022-10-06 NOTE — Patient Instructions (Signed)

## 2022-10-13 ENCOUNTER — Ambulatory Visit (HOSPITAL_COMMUNITY)
Admission: RE | Admit: 2022-10-13 | Discharge: 2022-10-13 | Disposition: A | Discharge status: Home / Self Care | Payer: Medicare HMO | Source: Ambulatory Visit | Attending: Hematology | Admitting: Hematology

## 2022-10-13 ENCOUNTER — Inpatient Hospital Stay: Payer: Medicare HMO | Attending: Hematology

## 2022-10-13 DIAGNOSIS — D509 Iron deficiency anemia, unspecified: Secondary | ICD-10-CM | POA: Diagnosis not present

## 2022-10-13 DIAGNOSIS — E119 Type 2 diabetes mellitus without complications: Secondary | ICD-10-CM | POA: Insufficient documentation

## 2022-10-13 DIAGNOSIS — Z85048 Personal history of other malignant neoplasm of rectum, rectosigmoid junction, and anus: Secondary | ICD-10-CM | POA: Insufficient documentation

## 2022-10-13 DIAGNOSIS — Z9049 Acquired absence of other specified parts of digestive tract: Secondary | ICD-10-CM | POA: Diagnosis not present

## 2022-10-13 DIAGNOSIS — E039 Hypothyroidism, unspecified: Secondary | ICD-10-CM | POA: Diagnosis not present

## 2022-10-13 DIAGNOSIS — C187 Malignant neoplasm of sigmoid colon: Secondary | ICD-10-CM | POA: Insufficient documentation

## 2022-10-13 DIAGNOSIS — Z794 Long term (current) use of insulin: Secondary | ICD-10-CM | POA: Insufficient documentation

## 2022-10-13 DIAGNOSIS — I10 Essential (primary) hypertension: Secondary | ICD-10-CM | POA: Diagnosis not present

## 2022-10-13 DIAGNOSIS — Z79899 Other long term (current) drug therapy: Secondary | ICD-10-CM | POA: Insufficient documentation

## 2022-10-13 LAB — COMPREHENSIVE METABOLIC PANEL
ALT: 13 U/L (ref 0–44)
AST: 17 U/L (ref 15–41)
Albumin: 3 g/dL — ABNORMAL LOW (ref 3.5–5.0)
Alkaline Phosphatase: 106 U/L (ref 38–126)
Anion gap: 7 (ref 5–15)
BUN: 24 mg/dL — ABNORMAL HIGH (ref 8–23)
CO2: 27 mmol/L (ref 22–32)
Calcium: 9.4 mg/dL (ref 8.9–10.3)
Chloride: 106 mmol/L (ref 98–111)
Creatinine, Ser: 1.22 mg/dL — ABNORMAL HIGH (ref 0.44–1.00)
GFR, Estimated: 50 mL/min — ABNORMAL LOW (ref >60)
Glucose, Bld: 158 mg/dL — ABNORMAL HIGH (ref 70–99)
Potassium: 4 mmol/L (ref 3.5–5.1)
Sodium: 140 mmol/L (ref 135–145)
Total Bilirubin: 0.4 mg/dL (ref 0.3–1.2)
Total Protein: 6.8 g/dL (ref 6.5–8.1)

## 2022-10-13 LAB — IRON AND TIBC
Iron: 73 ug/dL (ref 28–170)
Saturation Ratios: 28 % (ref 10.4–31.8)
TIBC: 257 ug/dL (ref 250–450)
UIBC: 184 ug/dL

## 2022-10-13 LAB — CBC WITH DIFFERENTIAL/PLATELET
Abs Immature Granulocytes: 0.03 10*3/uL (ref 0.00–0.07)
Basophils Absolute: 0.1 10*3/uL (ref 0.0–0.1)
Basophils Relative: 1 %
Eosinophils Absolute: 0.6 10*3/uL — ABNORMAL HIGH (ref 0.0–0.5)
Eosinophils Relative: 6 %
HCT: 40.4 % (ref 36.0–46.0)
Hemoglobin: 12.7 g/dL (ref 12.0–15.0)
Immature Granulocytes: 0 %
Lymphocytes Relative: 17 %
Lymphs Abs: 1.8 10*3/uL (ref 0.7–4.0)
MCH: 24.3 pg — ABNORMAL LOW (ref 26.0–34.0)
MCHC: 31.4 g/dL (ref 30.0–36.0)
MCV: 77.2 fL — ABNORMAL LOW (ref 80.0–100.0)
Monocytes Absolute: 0.5 10*3/uL (ref 0.1–1.0)
Monocytes Relative: 5 %
Neutro Abs: 7.7 10*3/uL (ref 1.7–7.7)
Neutrophils Relative %: 71 %
Platelets: 343 10*3/uL (ref 150–400)
RBC: 5.23 MIL/uL — ABNORMAL HIGH (ref 3.87–5.11)
RDW: 17.7 % — ABNORMAL HIGH (ref 11.5–15.5)
WBC: 10.7 10*3/uL — ABNORMAL HIGH (ref 4.0–10.5)
nRBC: 0 % (ref 0.0–0.2)

## 2022-10-13 LAB — FERRITIN: Ferritin: 18 ng/mL (ref 11–307)

## 2022-10-13 LAB — MAGNESIUM: Magnesium: 2 mg/dL (ref 1.7–2.4)

## 2022-10-13 MED ORDER — IOHEXOL 300 MG/ML  SOLN
100.0000 mL | Freq: Once | INTRAMUSCULAR | Status: AC | PRN
  Administered 2022-10-13: 100 mL via INTRAVENOUS

## 2022-10-15 LAB — CEA: CEA: 1.5 ng/mL (ref 0.0–4.7)

## 2022-10-19 ENCOUNTER — Inpatient Hospital Stay: Payer: Medicare HMO | Admitting: Hematology

## 2022-10-19 ENCOUNTER — Ambulatory Visit (INDEPENDENT_AMBULATORY_CARE_PROVIDER_SITE_OTHER): Payer: Medicare HMO | Admitting: Neurology

## 2022-10-19 ENCOUNTER — Encounter: Payer: Self-pay | Admitting: Neurology

## 2022-10-19 VITALS — Ht 61.0 in | Wt 260.0 lb

## 2022-10-19 DIAGNOSIS — M79641 Pain in right hand: Secondary | ICD-10-CM | POA: Diagnosis not present

## 2022-10-19 DIAGNOSIS — M21371 Foot drop, right foot: Secondary | ICD-10-CM | POA: Diagnosis not present

## 2022-10-19 DIAGNOSIS — G629 Polyneuropathy, unspecified: Secondary | ICD-10-CM

## 2022-10-19 MED ORDER — GABAPENTIN 300 MG PO CAPS: ORAL_CAPSULE | ORAL | 11 refills | Status: DC

## 2022-10-19 NOTE — Patient Instructions (Signed)
Good to meet you.  Schedule EMG/NCV of the right upper and lower extremity  2. Increase Gabapentin to '300mg'$ : take 1 capsule three times a day  3. Restart physical therapy for right foot drop, may need right AFO  4. Continue with improving sugar control  5. Follow-up in 5 months, call for any changes

## 2022-10-19 NOTE — Progress Notes (Signed)
NEUROLOGY CONSULTATION NOTE  [name] MRN: [id number] DOB: 1961/06/05  Referring provider: Dr. Jenetta Downer Primary care provider: Lars Mage, NP  Reason for consult:  dizziness, suspect central cause  Dear Dr Sabino Gasser:  Thank you for your kind referral of [name] for consultation of the above symptoms. Although her history is well known to you, please allow me to reiterate it for the purpose of our medical record. The patient was accompanied to the clinic by her husband who also provides collateral information. Records and images were personally reviewed where available.   HISTORY OF PRESENT ILLNESS: This is a very pleasant 61 year old right-handed woman with a history of DM, hypertension, hyperlipidemia, hypothyroidism, COPD, colon cancer, presenting for evaluation of dizziness. She was recently evaluated by ENT, no signs of peripheral cause of vertigo, suspect central cause of dizziness. She and her husband report that symptoms started around 4 years ago when she suddenly started having frequent falls. She describes the dizziness as more of a balance issue. She denies any vertigo or lightheadedness. Symptoms only occur when standing, she does not feel "dizzy" when supine or sitting. She denies any headaches, double vision, difficulty swallowing. No neck or back pain. They report her last fall was in December 2022, at that time her right leg went numb and she fell. She has been at [facility] since February 2023 due to the frequent falls. They report she has not fallen since then because she has not done much walking, she is mostly in the wheelchair or uses a walker. She has had numbness in both feet and her right hand for several years. She describes constant numbness and tingling in her feet and right hand, with stabbing pain in her feet and achiness in her right hand. She cannot flex her right foot and states her toes are curling in. She has been diabetic  "since age 48." She states glucose levels are "bad," she thinks her last HbA1c was 9 (7.2 in 07/2022). At the SNF, glucose levels go up to 300, but are low when she wakes up. She reports insulin dose was changed recently and she is on a strict diet. She has been doing physical therapy for the past 6 months but recently finished.   She had a head CT in 12/2021 ordered for altered mental status in the setting of hypoglycemia ("glucose <10"), no acute changes, there was atrophy and chronic microvascular disease.  PAST MEDICAL HISTORY: Past Medical History:  Diagnosis Date   Anemia    Arthritis    Asthma    Cancer of sigmoid ([place])    COPD (chronic obstructive pulmonary disease) ([place])    Depression    Diabetes mellitus    Diastolic dysfunction 40/98/1191   Dyspnea    Generalized weakness    Hyperlipidemia    Hypertension    Hypothyroidism    Obesity    Palpitations    Pneumonia    PONV (postoperative nausea and vomiting)    Prolonged QT interval 05/14/2015   Sleep apnea     PAST SURGICAL HISTORY: Past Surgical History:  Procedure Laterality Date   BIOPSY  04/06/2022   Procedure: BIOPSY;  Surgeon: Harvel Quale, MD;  Location: [facility];  Service: Gastroenterology;;   CATARACT EXTRACTION     CESAREAN SECTION     CHOLECYSTECTOMY     COLONOSCOPY WITH PROPOFOL N/A 04/06/2022   Procedure: COLONOSCOPY WITH PROPOFOL;  Surgeon: Harvel Quale, MD;  [facility]: [facility];  Service: Gastroenterology;  Laterality: N/A;  805 ASA 2 patient in [place]   ESOPHAGOGASTRODUODENOSCOPY (EGD) WITH PROPOFOL N/A 04/06/2022   Procedure: ESOPHAGOGASTRODUODENOSCOPY (EGD) WITH PROPOFOL;  Surgeon: Harvel Quale, MD;  [facility];  Service: Gastroenterology;  Laterality: N/A;   HEMOSTASIS CLIP PLACEMENT  04/06/2022   Procedure: HEMOSTASIS CLIP PLACEMENT;  Surgeon: Harvel Quale, MD;  Location: [facility];  Service:  Gastroenterology;;   POLYPECTOMY  04/06/2022   Procedure: POLYPECTOMY;  Surgeon: Montez Morita, Quillian Quince, MD;  Location: [facility];  Service: Gastroenterology;;   SUBMUCOSAL TATTOO INJECTION  04/06/2022   Procedure: SUBMUCOSAL TATTOO INJECTION;  Surgeon: Montez Morita, Quillian Quince, MD;  Location: [facility];  Service: Gastroenterology;;    MEDICATIONS: Current Outpatient Medications on File Prior to Visit  Medication Sig Dispense Refill   ADMELOG 100 UNIT/ML injection Inject 4-8 Units into the skin 3 (three) times daily with meals. If blood glucose is 200-250= 4 units, 250-300= 5 units, 301-350= 6 units, 351-400= 8 units, > 400 notify MD     albuterol (VENTOLIN HFA) 108 (90 Base) MCG/ACT inhaler Inhale 2 puffs into the lungs every 6 (six) hours as needed for wheezing or shortness of breath.     alum & mag hydroxide-simeth (MAALOX/MYLANTA) 200-200-20 MG/5ML suspension Take 30 mLs by mouth every 2 (two) hours as needed for indigestion or heartburn. Do not exceed 4 doses in 24 hours     amLODipine (NORVASC) 10 MG tablet Take 10 mg by mouth daily.     ascorbic acid (VITAMIN C) 500 MG tablet Take 500 mg by mouth daily.     atorvastatin (LIPITOR) 10 MG tablet Take 10 mg by mouth at bedtime.     Cholecalciferol (VITAMIN D) 50 MCG (2000 UT) CAPS Take 2,000 Units by mouth daily.     doxycycline (VIBRAMYCIN) 100 MG capsule Take 1 capsule (100 mg total) by mouth every 12 (twelve) hours. 14 capsule 0   Emollient (EUCERIN) lotion Apply 1 Application topically every 12 (twelve) hours as needed for dry skin.     ferrous sulfate 325 (65 FE) MG tablet Take 325 mg by mouth daily.     furosemide (LASIX) 20 MG tablet Take 20 mg by mouth daily as needed for edema.     gabapentin (NEURONTIN) 100 MG capsule Take 1 capsule (100 mg total) by mouth 3 (three) times daily.     hydrALAZINE (APRESOLINE) 25 MG tablet Take 1 tablet (25 mg total) by mouth every 8 (eight) hours.     hydrOXYzine (ATARAX/VISTARIL) 10 MG  tablet Take 10 mg by mouth every 8 (eight) hours as needed for itching or anxiety.     LANTUS 100 UNIT/ML injection Inject 30 Units into the skin at bedtime.     levothyroxine (SYNTHROID) 88 MCG tablet Take 88 mcg by mouth daily before breakfast.     loperamide (IMODIUM A-D) 2 MG tablet Take 2 mg by mouth as needed for diarrhea or loose stools. Do not exceed 4 per day     magnesium oxide (MAG-OX) 400 (240 Mg) MG tablet Take 400 mg by mouth daily.     Menthol-Zinc Oxide (CALMOSEPTINE) 0.44-20.6 % OINT Apply 1 Application topically as needed (preservation/protection after incontinent care).     mirabegron ER (MYRBETRIQ) 25 MG TB24 tablet Take 1 tablet (25 mg total) by mouth daily. 30 tablet 3   Nystatin (GERHARDT'S BUTT CREAM) CREA As directed     nystatin (MYCOSTATIN/NYSTOP) powder Apply topically 2 (two)  times daily. 15 g 0   omeprazole (PRILOSEC) 20 MG capsule Take 20 mg by mouth in the morning and at bedtime.     potassium chloride SA (K-DUR) 20 MEQ tablet Take 20 mEq by mouth daily.     PROCRIT 4000 UNIT/ML injection Inject 4,000 Units into the skin every Monday, Wednesday, and Friday. Hold if hemoglobin is > 10     propranolol (INDERAL) 80 MG tablet Take 80 mg by mouth daily.     sertraline (ZOLOFT) 50 MG tablet Take 3 tablets (150 mg total) by mouth daily.     fosfomycin (MONUROL) 3 g PACK Take 3 g by mouth every 3 (three) days. (Patient not taking: Reported on 10/19/2022)     Glucerna (GLUCERNA) LIQD Take 237 mLs by mouth daily. For wound healing (Patient not taking: Reported on 10/19/2022)     insulin glargine-yfgn (SEMGLEE) 100 UNIT/ML injection Inject 0.28 mLs (28 Units total) into the skin daily.     No current facility-administered medications on file prior to visit.    ALLERGIES: Allergies  Allergen Reactions   Carvedilol Palpitations   Benicar [Olmesartan] Swelling   Codeine Other (See Comments)    Confusion    Sulfa Antibiotics Swelling    Whole face swells   Trulicity  [Dulaglutide] Diarrhea    FAMILY HISTORY: Family History  Problem Relation Age of Onset   Stroke Mother    Diabetes Father    Heart failure Father    Hypertension Father    Diabetes Sister    Heart failure Sister    Hypertension Sister    Stroke Sister    Cancer Other    Stroke Sister     SOCIAL HISTORY: Social History   Socioeconomic History   Marital status: Married    Spouse name: Not on file   Number of children: Not on file   Years of education: Not on file   Highest education level: Not on file  Occupational History   Not on file  Tobacco Use   Smoking status: Never    Passive exposure: Yes   Smokeless tobacco: Never  Vaping Use   Vaping Use: Never used  Substance and Sexual Activity   Alcohol use: No    Alcohol/week: 0.0 standard drinks of alcohol   Drug use: No   Sexual activity: Yes    Birth control/protection: None  Other Topics Concern   Not on file  Social History Narrative   Lives with husband.  One living son.  Daughter died after transplant age 31.     Are you right handed or left handed? Right   Are you currently employed ? 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    What type of home do you live in: 1 story or 2 story? one       Social Determinants of Health   Financial Resource Strain: Not on file  Food Insecurity: Unknown (08/12/2022)   Hunger Vital Sign    Worried About Running Out of Food in the Last Year: Patient refused    [place] in the Last Year: Patient refused  Transportation Needs: No Transportation Needs (08/12/2022)   PRAPARE - Hydrologist (Medical): No    Lack of Transportation (Non-Medical): No  Physical Activity: Not on file  Stress: Not on file  Social Connections: Not on file  Intimate Partner Violence: Unknown (08/12/2022)  Humiliation, Afraid, Rape, and Kick questionnaire    Fear of Current or Ex-Partner: No    Emotionally  Abused: No    Physically Abused: Not on file    Sexually Abused: Not on file     PHYSICAL EXAM: Vitals:   10/19/22 0835  BP: (!) 107/55  Pulse: 65  SpO2: 96%   Orthostatic VS for the past 72 hrs (Last 3 readings):  Orthostatic BP Patient Position BP Location Cuff Size Orthostatic Pulse  10/19/22 0857 110/66 Standing Left Arm Small 66  10/19/22 0856 112/52 Sitting Left Arm Small 61  10/19/22 0835 107/55 -- -- -- 65    General: No acute distress Head:  Normocephalic/atraumatic Skin/Extremities: No rash, +skin changes in both shins with some edema Neurological Exam: Mental status: alert and awake, no dysarthria or aphasia, Fund of knowledge is appropriate.  Attention and concentration are normal. Cranial nerves: CN I: not tested CN II: pupils equal, round, visual fields intact CN III, IV, VI:  full range of motion, no nystagmus, no ptosis CN V: facial sensation intact CN VII: upper and lower face symmetric CN VIII: hearing intact to conversation CN XI: sternocleidomastoid and trapezius muscles intact CN XII: tongue midline Bulk & Tone: normal, no fasciculations. Motor: 5/5 on both UE except for 4/5 right APB. 5/5 left LE, 5/5 right hip flexion, knee flexion/extension, 4/5 right foot plantarflexion, 0/5 right foot dorsiflexion, eversion, inversion, great toe.  Sensation: decreased cold and pin on dorsum of right hand, decreased cold and vibration sense to knees bilaterally, decreased pin to mid-calf bilaterally with tingling reported.  Deep Tendon Reflexes: +2 on both UE, unable to elicit on both LE Plantar responses: downgoing bilaterally (pain on testing) Cerebellar: no incoordination on finger to nose testing Gait: needs 2-person assist, slow and cautious with right foot drop Tremor: none   IMPRESSION: This is a very pleasant 61 year old right-handed woman with a history of DM, hypertension, hyperlipidemia, hypothyroidism, COPD, colon cancer, presenting for evaluation of  dizziness. On further questioning, dizziness is described as a balance issue rather than vertigo or lightheadedness. She has had frequent falls due to this. She is not orthostatic in the office today. Exam shows significant neuropathy, with right foot drop. We discussed how neuropathy can cause balance issues and falls. She also reports right hand pain, numbness/tingling. She will be scheduled for an EMG/NCV of the right upper and lower extremities. Increase Gabapentin to '300mg'$  TID for neuropathic pain. She has not had any falls since December 2022 but has not been ambulatory much. Recommend physical therapy for right foot drop, consider AFO. Continue working on improving glucose control. Follow-up in 5 months, call for any changes.    Thank you for allowing me to participate in the care of this patient. Please do not hesitate to call for any questions or concerns.   Ellouise Newer, M.D.  CC: Dr. Sabino Gasser, Lars Mage, NP

## 2022-10-26 ENCOUNTER — Inpatient Hospital Stay (HOSPITAL_BASED_OUTPATIENT_CLINIC_OR_DEPARTMENT_OTHER): Payer: Medicare HMO | Admitting: Hematology

## 2022-10-26 VITALS — BP 136/66 | HR 65 | Temp 98.3°F | Resp 20 | Ht 61.0 in | Wt 261.0 lb

## 2022-10-26 DIAGNOSIS — Z85048 Personal history of other malignant neoplasm of rectum, rectosigmoid junction, and anus: Secondary | ICD-10-CM | POA: Diagnosis not present

## 2022-10-26 DIAGNOSIS — C187 Malignant neoplasm of sigmoid colon: Secondary | ICD-10-CM

## 2022-10-26 DIAGNOSIS — D508 Other iron deficiency anemias: Secondary | ICD-10-CM

## 2022-10-26 NOTE — Progress Notes (Signed)
[place], [place] [address]   CLINIC:  Medical Oncology/Hematology  PCP:  Teresa Hartshorn, [name] [place] [address] [place] [address] [phone]   REASON FOR VISIT:  Follow-up for stage II rectosigmoid cancer  PRIOR THERAPY: Low anterior resection on 08/11/2022  NGS Results: MMR-preserved  CURRENT THERAPY: Surveillance  BRIEF ONCOLOGIC HISTORY:  Oncology History  Cancer of sigmoid colon ([place])  05/12/2022 Initial Diagnosis   Cancer of sigmoid colon ([place])   05/12/2022 Cancer Staging   Staging form: Colon and Rectum, AJCC 8th Edition - Clinical stage from 05/12/2022: Stage IIA (cT3, cN0, cM0) - Signed by Derek Jack, MD on 09/08/2022 Histopathologic type: Adenocarcinoma, NOS Total positive nodes: 0 Histologic grade (G): G2 Histologic grading system: 4 grade system     CANCER STAGING:  Cancer Staging  Cancer of sigmoid colon [facility]) Staging form: Colon and Rectum, AJCC 8th Edition - Clinical stage from 05/12/2022: Stage IIA (cT3, cN0, cM0) - Signed by Derek Jack, MD on 09/08/2022    INTERVAL HISTORY:  [name] 61 y.o. female seen for follow-up after low anterior resection.  Denies any bleeding per rectum or melena.  She is taking iron tablet daily.   REVIEW OF SYSTEMS:  Review of Systems  All other systems reviewed and are negative.    PAST MEDICAL/SURGICAL HISTORY:  Past Medical History:  Diagnosis Date   Anemia    Arthritis    Asthma    Cancer of sigmoid ([place])    COPD (chronic obstructive pulmonary disease) ([place])    Depression    Diabetes mellitus    Diastolic dysfunction 74/11/8785   Dyspnea    Generalized weakness    Hyperlipidemia    Hypertension    Hypothyroidism    Obesity    Palpitations    Pneumonia    PONV (postoperative nausea and vomiting)    Prolonged QT interval 05/14/2015   Sleep apnea    Past Surgical History:  Procedure Laterality Date   BIOPSY   04/06/2022   Procedure: BIOPSY;  Surgeon: Harvel Quale, MD;  [facility];  Service: Gastroenterology;;   CATARACT EXTRACTION     CESAREAN SECTION     CHOLECYSTECTOMY     COLONOSCOPY WITH PROPOFOL N/A 04/06/2022   Procedure: COLONOSCOPY WITH PROPOFOL;  Surgeon: Harvel Quale, MD;  Location: [facility];  Service: Gastroenterology;  Laterality: N/A;  805 ASA 2 patient in [place]   ESOPHAGOGASTRODUODENOSCOPY (EGD) WITH PROPOFOL N/A 04/06/2022   Procedure: ESOPHAGOGASTRODUODENOSCOPY (EGD) WITH PROPOFOL;  Surgeon: Harvel Quale, MD;  [facility];  Service: Gastroenterology;  Laterality: N/A;   HEMOSTASIS CLIP PLACEMENT  04/06/2022   Procedure: HEMOSTASIS CLIP PLACEMENT;  Surgeon: Harvel Quale, MD;  Location[facility];  Service: Gastroenterology;;   POLYPECTOMY  04/06/2022   Procedure: POLYPECTOMY;  Surgeon: Montez Morita, Quillian Quince, MD;  [facility];  Service: Gastroenterology;;   SUBMUCOSAL TATTOO INJECTION  04/06/2022   Procedure: SUBMUCOSAL TATTOO INJECTION;  Surgeon: Harvel Quale, MD;  [facility];  Service: Gastroenterology;;     SOCIAL HISTORY:  Social History   Socioeconomic History   Marital status: Married    Spouse name: Not on file   Number of children: Not on file   Years of education: Not on file   Highest education level: Not on file  Occupational History   Not on file  Tobacco Use   Smoking status:  Never    Passive exposure: Yes   Smokeless tobacco: Never  Vaping Use   Vaping Use: Never used  Substance and Sexual Activity   Alcohol use: No    Alcohol/week: 0.0 standard drinks of alcohol   Drug use: No   Sexual activity: Yes    Birth control/protection: None  Other Topics Concern   Not on file  Social History Narrative   Lives with husband.  One living son.  Daughter died after transplant age 25.     Are you right handed or left handed?  Right   Are you currently employed ? 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    What type of home do you live in: 1 story or 2 story? one       Social Determinants of Health   Financial Resource Strain: Not on file  Food Insecurity: Unknown (08/12/2022)   Hunger Vital Sign    Worried About Running Out of Food in the Last Year: Patient refused    [place] in the Last Year: Patient refused  Transportation Needs: No Transportation Needs (08/12/2022)   PRAPARE - Hydrologist (Medical): No    Lack of Transportation (Non-Medical): No  Physical Activity: Not on file  Stress: Not on file  Social Connections: Not on file  Intimate Partner Violence: Unknown (08/12/2022)   Humiliation, Afraid, Rape, and Kick questionnaire    Fear of Current or Ex-Partner: No    Emotionally Abused: No    Physically Abused: Not on file    Sexually Abused: Not on file    FAMILY HISTORY:  Family History  Problem Relation Age of Onset   Stroke Mother    Diabetes Father    Heart failure Father    Hypertension Father    Diabetes Sister    Heart failure Sister    Hypertension Sister    Stroke Sister    Cancer Other    Stroke Sister     CURRENT MEDICATIONS:  Outpatient Encounter Medications as of 10/26/2022  Medication Sig Note   ADMELOG 100 UNIT/ML injection Inject 4-8 Units into the skin 3 (three) times daily with meals. If blood glucose is 200-250= 4 units, 250-300= 5 units, 301-350= 6 units, 351-400= 8 units, > 400 notify MD    albuterol (VENTOLIN HFA) 108 (90 Base) MCG/ACT inhaler Inhale 2 puffs into the lungs every 6 (six) hours as needed for wheezing or shortness of breath.    alum & mag hydroxide-simeth (MAALOX/MYLANTA) 200-200-20 MG/5ML suspension Take 30 mLs by mouth every 2 (two) hours as needed for indigestion or heartburn. Do not exceed 4 doses in 24 hours    amLODipine (NORVASC) 10 MG tablet Take 10 mg by  mouth daily.    ascorbic acid (VITAMIN C) 500 MG tablet Take 500 mg by mouth daily.    atorvastatin (LIPITOR) 10 MG tablet Take 10 mg by mouth at bedtime.    Cholecalciferol (VITAMIN D) 50 MCG (2000 UT) CAPS Take 2,000 Units by mouth daily.    doxycycline (VIBRAMYCIN) 100 MG capsule Take 1 capsule (100 mg total) by mouth every 12 (twelve) hours.    Emollient (EUCERIN) lotion Apply 1 Application topically every 12 (twelve) hours as needed for dry skin.    ferrous sulfate 325 (65 FE) MG tablet Take 325 mg by mouth daily.    fosfomycin (MONUROL) 3 g PACK Take 3 g  by mouth every 3 (three) days. (Patient not taking: Reported on 10/19/2022)    furosemide (LASIX) 20 MG tablet Take 20 mg by mouth daily as needed for edema.    gabapentin (NEURONTIN) 300 MG capsule Take 1 capsule three times a day    Glucerna (GLUCERNA) LIQD Take 237 mLs by mouth daily. For wound healing (Patient not taking: Reported on 10/19/2022)    hydrALAZINE (APRESOLINE) 25 MG tablet Take 1 tablet (25 mg total) by mouth every 8 (eight) hours.    hydrOXYzine (ATARAX/VISTARIL) 10 MG tablet Take 10 mg by mouth every 8 (eight) hours as needed for itching or anxiety.    insulin glargine-yfgn (SEMGLEE) 100 UNIT/ML injection Inject 0.28 mLs (28 Units total) into the skin daily.    LANTUS 100 UNIT/ML injection Inject 30 Units into the skin at bedtime. 08/12/2022: MAR indicates the patient is receiving Semglee in the AM and Lantus in the PM   levothyroxine (SYNTHROID) 88 MCG tablet Take 88 mcg by mouth daily before breakfast.    loperamide (IMODIUM A-D) 2 MG tablet Take 2 mg by mouth as needed for diarrhea or loose stools. Do not exceed 4 per day    magnesium oxide (MAG-OX) 400 (240 Mg) MG tablet Take 400 mg by mouth daily.    Menthol-Zinc Oxide (CALMOSEPTINE) 0.44-20.6 % OINT Apply 1 Application topically as needed (preservation/protection after incontinent care).    mirabegron ER (MYRBETRIQ) 25 MG TB24 tablet Take 1 tablet (25 mg total) by  mouth daily.    Nystatin (GERHARDT'S BUTT CREAM) CREA As directed    nystatin (MYCOSTATIN/NYSTOP) powder Apply topically 2 (two) times daily.    omeprazole (PRILOSEC) 20 MG capsule Take 20 mg by mouth in the morning and at bedtime.    potassium chloride SA (K-DUR) 20 MEQ tablet Take 20 mEq by mouth daily.    PROCRIT 4000 UNIT/ML injection Inject 4,000 Units into the skin every Monday, Wednesday, and Friday. Hold if hemoglobin is > 10    propranolol (INDERAL) 80 MG tablet Take 80 mg by mouth daily.    sertraline (ZOLOFT) 50 MG tablet Take 3 tablets (150 mg total) by mouth daily.    No facility-administered encounter medications on file as of 10/26/2022.    ALLERGIES:  Allergies  Allergen Reactions   Carvedilol Palpitations   Benicar [Olmesartan] Swelling   Codeine Other (See Comments)    Confusion    Sulfa Antibiotics Swelling    Whole face swells   Trulicity [Dulaglutide] Diarrhea     PHYSICAL EXAM:  ECOG Performance status: 2  There were no vitals filed for this visit.  There were no vitals filed for this visit.  Physical Exam Vitals reviewed.  Constitutional:      Appearance: Normal appearance.  Cardiovascular:     Rate and Rhythm: Normal rate and regular rhythm.     Heart sounds: Normal heart sounds.  Pulmonary:     Breath sounds: Normal breath sounds.  Neurological:     Mental Status: She is alert.  Psychiatric:        Mood and Affect: Mood normal.        Behavior: Behavior normal.     LABORATORY DATA:  I have reviewed the labs as listed.  CBC    Component Value Date/Time   WBC 10.7 (H) 10/13/2022 1232   RBC 5.23 (H) 10/13/2022 1232   HGB 12.7 10/13/2022 1232   HCT 40.4 10/13/2022 1232   PLT 343 10/13/2022 1232   MCV 77.2 (L) 10/13/2022 1232  MCH 24.3 (L) 10/13/2022 1232   MCHC 31.4 10/13/2022 1232   RDW 17.7 (H) 10/13/2022 1232   LYMPHSABS 1.8 10/13/2022 1232   MONOABS 0.5 10/13/2022 1232   EOSABS 0.6 (H) 10/13/2022 1232   BASOSABS 0.1  10/13/2022 1232      Latest Ref Rng & Units 10/13/2022   12:32 PM 08/14/2022    5:47 AM 08/13/2022    4:52 AM  CMP  Glucose 70 - 99 mg/dL 158  185  117   BUN 8 - 23 mg/dL _0 Creatinine 0.44 - 1.00 mg/dL 1.22  1.25  1.28   Sodium 135 - 145 mmol/L 140  139  139   Potassium 3.5 - 5.1 mmol/L 4.0  3.4  3.9   Chloride 98 - 111 mmol/L 106  107  107   CO2 22 - 32 mmol/L _1 Calcium 8.9 - 10.3 mg/dL 9.4  9.1  8.9   Total Protein 6.5 - 8.1 g/dL 6.8     Total Bilirubin 0.3 - 1.2 mg/dL 0.4     Alkaline Phos 38 - 126 U/L 106     AST 15 - 41 U/L 17     ALT 0 - 44 U/L 13       DIAGNOSTIC IMAGING:  I have independently reviewed the scans and discussed with the patient.  ASSESSMENT:  1.  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  Rectal mass biopsy consistent with moderately differentiated adenocarcinoma. - CEA (04/06/2022): 25.7. - CT CAP (04/27/2022): No evidence of mass, lymphadenopathy or metastatic disease in the chest, abdomen or pelvis.  Clustered subsolid nodular opacities in the superior segment of the left lower lobe measures 0.6 cm. - Rectal MRI (05/10/2022): Lesion is in the sigmoid colon measuring 4 cm with findings for small volume extension into sigmoid mesocolon.  Intermittent association with adjacent uterus without gross invasion.  Sigmoid mesocolon node, borderline size.  Right external iliac nodes are favored to be reactive. - Low anterior resection (08/11/2022) by Dr. Marcello Moores - Pathology: Moderately differentiated adenocarcinoma, margins negative.  Site of tumor is rectosigmoid junction.  0/14 lymph nodes involved.  Negative LVI and PNI.  PT3 pN0.  MMR preserved.  2.  Social/family history: - She is a resident at [facility] for the past few months due to falls from right foot drop and numbness in the right hand, right foot and  lower leg.  Previously she worked at the nursing home.  She is a non-smoker. - Maternal grandmother had colon cancer.   PLAN:  1.  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 - Reviewed labs from 10/13/2022 which showed normal LFTs.  CBC was grossly normal.  CEA was 1.5. - CT CAP (10/13/2022): No evidence of metastatic disease in the chest, abdomen or pelvis.  Stable mild right common iliac lymphadenopathy nonspecific.  Left lower lobe nodular opacities have resolved. - Recommend follow-up in 3 months with repeat CEA and other labs.  2.  Microcytic anemia: - Hemoglobin is 12.7 with MCV of 77. - Ferritin is 18 and percent saturation 28.  Continue iron tablet daily.  Repeat ferritin and iron panel at next visit.   Orders placed this encounter:  No orders of the defined types were placed in this encounter.  Derek Jack, MD [place] [phone]

## 2022-10-26 NOTE — Patient Instructions (Signed)
[place] at [facility] Discharge Instructions   You were seen and examined today by Dr. Delton Coombes.  He reviewed the results of your CT scan which did not show any evidence of cancer anywhere in your body.  He reviewed the results of your lab work which is mostly normal. You have a slight degree of kidney dysfunction which is stable.   Continue iron tablet one pill a day.   We will see you back in 3 months. We will repeat lab work prior to this visit.    Thank you for choosing [place] at [facility] to provide your oncology and hematology care.  To afford each patient quality time with our provider, please arrive at least 15 minutes before your scheduled appointment time.   If you have a lab appointment with the [facility] please come in thru the Main Entrance and check in at the main information desk.  You need to re-schedule your appointment should you arrive 10 or more minutes late.  We strive to give you quality time with our providers, and arriving late affects you and other patients whose appointments are after yours.  Also, if you no show three or more times for appointments you may be dismissed from the clinic at the providers discretion.     Again, thank you for choosing [facility].  Our hope is that these requests will decrease the amount of time that you wait before being seen by our physicians.       _____________________________________________________________  Should you have questions after your visit to [facility], please contact our office at [phone] and follow the prompts.  Our office hours are 8:00 a.m. and 4:30 p.m. Monday - Friday.  Please note that voicemails left after 4:00 p.m. may not be returned until the following business day.  We are closed weekends and major holidays.  You do have access to a nurse 24-7, just call the main number to the clinic [phone] and do not press  any options, hold on the line and a nurse will answer the phone.    For prescription refill requests, have your pharmacy contact our office and allow 72 hours.    Due to Covid, you will need to wear a mask upon entering the hospital. If you do not have a mask, a mask will be given to you at the Main Entrance upon arrival. For doctor visits, patients may have 1 support person age 74 or older with them. For treatment visits, patients can not have anyone with them due to social distancing guidelines and our immunocompromised population.

## 2022-11-03 ENCOUNTER — Other Ambulatory Visit: Payer: Self-pay

## 2022-11-03 ENCOUNTER — Telehealth: Payer: Self-pay | Admitting: Anesthesiology

## 2022-11-03 DIAGNOSIS — G459 Transient cerebral ischemic attack, unspecified: Secondary | ICD-10-CM

## 2022-11-03 NOTE — Telephone Encounter (Signed)
Dr Tessie Fass from [facility] called to Speak to Dr Delice Lesch about [facility]. He thinks the patient has had a stroke.

## 2022-11-03 NOTE — Telephone Encounter (Signed)
MRI has been ordered and sent to [facility]

## 2022-11-03 NOTE — Telephone Encounter (Signed)
Dr Ninfa Meeker call back number is [phone].

## 2022-11-03 NOTE — Telephone Encounter (Signed)
Spoke to Dr. Ninfa Meeker. He thinks she prob had a TIA this morning, fairly sudden onset confusion, numbness of tongue and mouth with no facial droop but loss of coordination of right hand, did not lose consciousness but was confused. Could speak. Lasted around 30 mins then completely resolved. Glucose was in upper 70s. History of colon CA, no further rectal bleeding since then. Started on ASA '81mg'$  daily. Had CXR with no pneumonia; stopped Augmentin. Discussed doing a brain MRI without contrast, and depending on results, may do stroke workup.   Can you pls order brain MRI without contrast; dx: TIA. Thanks

## 2022-11-13 ENCOUNTER — Ambulatory Visit
Admission: RE | Admit: 2022-11-13 | Discharge: 2022-11-13 | Disposition: A | Discharge status: Home / Self Care | Payer: Medicare HMO | Source: Ambulatory Visit | Attending: Neurology | Admitting: Neurology

## 2022-11-13 DIAGNOSIS — G459 Transient cerebral ischemic attack, unspecified: Secondary | ICD-10-CM

## 2022-11-17 ENCOUNTER — Telehealth: Payer: Self-pay

## 2022-11-17 NOTE — Telephone Encounter (Signed)
Pt called an informed brain MRI did not show any tumor, stroke, or bleed. It showed age-related changes and hardening of the small blood vessels seen in patients with blood pressure, cholesterol issues. Continue daily aspirin and control of these conditions. Proceed with EMG as scheduled.

## 2022-11-17 NOTE — Telephone Encounter (Signed)
-----   Message from Cameron Sprang, MD sent at 11/17/2022  2:01 PM EST ----- Pls let patient know the brain MRI did not show any tumor, stroke, or bleed. It showed age-related changes and hardening of the small blood vessels seen in patients with blood pressure, cholesterol issues. Continue daily aspirin and control of these conditions. Proceed with EMG as scheduled.

## 2022-11-18 ENCOUNTER — Ambulatory Visit (INDEPENDENT_AMBULATORY_CARE_PROVIDER_SITE_OTHER): Payer: Medicare HMO | Admitting: Neurology

## 2022-11-18 DIAGNOSIS — G5601 Carpal tunnel syndrome, right upper limb: Secondary | ICD-10-CM

## 2022-11-18 DIAGNOSIS — M79641 Pain in right hand: Secondary | ICD-10-CM

## 2022-11-18 DIAGNOSIS — G629 Polyneuropathy, unspecified: Secondary | ICD-10-CM

## 2022-11-18 DIAGNOSIS — M21371 Foot drop, right foot: Secondary | ICD-10-CM

## 2022-11-18 NOTE — Procedures (Signed)
[facility]  [place], [address]  [place], [place] [address] Tel: [phone] Fax: [phone] Test Date:  11/18/2022  Patient: [name] DOB: Jun 08, 1961 Physician: Narda Amber, DO  Sex: Female Height: '5\' 1"'$  Ref Phys: Ellouise Newer, MD  ID#: [id number]   Technician:    History: This is a 61 year old female with insulin-dependent diabetes referred for evaluation of right hand paresthesias and right foot drop.  NCV & EMG Findings: Extensive electrodiagnostic testing of the right upper and lower extremities and additional studies of the left lower extremity shows:  Right median sensory response is absent.  Right ulnar and radial sensory responses are within normal limits.  Bilateral superficial peroneal and sural sensory responses are absent. Right median motor responses absent.  Right peroneal (EBD and TA) and tibial motor responses are absent.  Left peroneal motor response at the tibialis anterior shows reduced amplitude (2.3 mV).  Right ulnar motor responses within normal limits. Right tibial H reflex study is absent. In the right upper extremity, despite maximal activation, no motor unit recruitment was seen in the abductor pollicis brevis muscle.  The remaining tested muscles of the upper extremity showed normal motor unit configuration and recruitment pattern.   In the right lower extremity, chronic motor axon loss changes are seen affecting the gastrocnemius and flexor digitorum longus muscles.  Despite maximal activation, no motor unit recruitment was seen in the tibialis anterior and fibularis longus muscles.  Examination of the gluteus medius muscle was technically challenging due to body physiognomy.    Impression: Chronic sensorimotor axonal polyneuropathy affecting the lower extremities, severe. Superimposed right common peroneal mononeuropathy proximal to the takeoff to the tibialis anterior muscle is also suspected.  However, an L5 radiculopathy  cannot be excluded by this study.  Correlate clinically. Right median neuropathy at or distal to the wrist, consistent with a clinical diagnosis of carpal tunnel syndrome.  Overall, these findings are very severe in degree electrically.   ___________________________ Narda Amber, DO    Nerve Conduction Studies   Stim Site NR Peak (ms) Norm Peak (ms) O-P Amp (V) Norm O-P Amp  Right Median Anti Sensory (2nd Digit)  33 C  Wrist *NR  <3.8  >10  Right Radial Anti Sensory (Base 1st Digit)  33 C  Wrist    2.3 <2.8 21.9 >10  Left Sup Peroneal Anti Sensory (Ant Lat Mall)  33 C  12 cm *NR  <4.6  >3  Right Sup Peroneal Anti Sensory (Ant Lat Mall)  33 C  12 cm *NR  <4.6  >3  Left Sural Anti Sensory (Lat Mall)  33 C  Calf *NR  <4.6  >3  Right Sural Anti Sensory (Lat Mall)  33 C  Calf *NR  <4.6  >3  Right Ulnar Anti Sensory (5th Digit)  33 C  Wrist    2.8 <3.2 9.1 >5     Stim Site NR Onset (ms) Norm Onset (ms) O-P Amp (mV) Norm O-P Amp Site1 Site2 Delta-0 (ms) Dist (cm) Vel (m/s) Norm Vel (m/s)  Right Median Motor (Abd Poll Brev)  33 C  Wrist *NR  <4.0  >5 Elbow Wrist  0.0  >50  Elbow *NR            Right Peroneal Motor (Ext Dig Brev)  33 C  Ankle *NR  <6.0  >2.5 B Fib Ankle  0.0  >40  B Fib *NR     Poplt B Fib  0.0  >40  Poplt *NR  Left Peroneal TA Motor (Tib Ant)  33 C  Fib Head    3.4 <4.5 *2.3 >3 Poplit Fib Head 1.6 7.0 44 >40  Poplit    5.0 <5.7 1.8         Right Peroneal TA Motor (Tib Ant)  33 C  Fib Head *NR  <4.5  >3 Poplit Fib Head  0.0  >40  Poplit *NR  <5.7          Right Tibial Motor (Abd Hall Brev)  33 C  Ankle *NR  <6.0  >4 Knee Ankle  0.0  >40  Knee *NR            Right Ulnar Motor (Abd Dig Minimi)  33 C  Wrist    2.6 <3.1 7.8 >7 B Elbow Wrist 3.5 19.0 54 >50  B Elbow    6.1  7.3  A Elbow B Elbow 2.0 10.0 50 >50  A Elbow    8.1  6.7          Electromyography   Side Muscle Ins.Act Fibs Fasc Recrt Amp Dur Poly Activation Comment  Right  1stDorInt Nml Nml Nml Nml Nml Nml Nml Nml N/A  Right Abd Poll Brev Nml Nml Nml *None *- *- *- Nml N/A  Right PronatorTeres Nml Nml Nml Nml Nml Nml Nml Nml N/A  Right Biceps Nml Nml Nml Nml Nml Nml Nml Nml N/A  Right Triceps Nml Nml Nml Nml Nml Nml Nml Nml N/A  Right Deltoid Nml Nml Nml Nml Nml Nml Nml Nml N/A  Right AntTibialis Nml Nml Nml *None *- *- *- Nml N/A  Right Gastroc Nml Nml Nml *2- *1+ *1+ *1+ Nml N/A  Right Flex Dig Long Nml Nml Nml *3- *1+ *1+ *1+ Nml N/A  Right RectFemoris Nml Nml Nml Nml Nml Nml Nml Nml N/A  Right BicepsFemS Nml Nml Nml Nml Nml Nml Nml Nml N/A  Right Fibularis Long Nml Nml Nml *None *- *- *- Nml N/A      Waveforms:

## 2022-11-22 ENCOUNTER — Other Ambulatory Visit: Payer: Self-pay

## 2022-11-22 DIAGNOSIS — M21371 Foot drop, right foot: Secondary | ICD-10-CM

## 2022-12-23 ENCOUNTER — Ambulatory Visit
Admission: RE | Admit: 2022-12-23 | Discharge: 2022-12-23 | Disposition: A | Discharge status: Home / Self Care | Payer: Medicare HMO | Source: Ambulatory Visit | Attending: Neurology | Admitting: Neurology

## 2022-12-23 DIAGNOSIS — M21371 Foot drop, right foot: Secondary | ICD-10-CM

## 2022-12-31 ENCOUNTER — Emergency Department (HOSPITAL_COMMUNITY)
Admission: EM | Admit: 2022-12-31 | Discharge: 2022-12-31 | Disposition: A | Discharge status: Home / Self Care | Payer: Medicare HMO | Attending: Emergency Medicine | Admitting: Emergency Medicine

## 2022-12-31 ENCOUNTER — Other Ambulatory Visit: Payer: Self-pay

## 2022-12-31 DIAGNOSIS — Z79899 Other long term (current) drug therapy: Secondary | ICD-10-CM | POA: Insufficient documentation

## 2022-12-31 DIAGNOSIS — N289 Disorder of kidney and ureter, unspecified: Secondary | ICD-10-CM | POA: Diagnosis not present

## 2022-12-31 DIAGNOSIS — D72829 Elevated white blood cell count, unspecified: Secondary | ICD-10-CM | POA: Diagnosis not present

## 2022-12-31 DIAGNOSIS — I10 Essential (primary) hypertension: Secondary | ICD-10-CM | POA: Diagnosis not present

## 2022-12-31 DIAGNOSIS — Z794 Long term (current) use of insulin: Secondary | ICD-10-CM | POA: Diagnosis not present

## 2022-12-31 DIAGNOSIS — E860 Dehydration: Secondary | ICD-10-CM

## 2022-12-31 DIAGNOSIS — E119 Type 2 diabetes mellitus without complications: Secondary | ICD-10-CM | POA: Insufficient documentation

## 2022-12-31 DIAGNOSIS — Z85048 Personal history of other malignant neoplasm of rectum, rectosigmoid junction, and anus: Secondary | ICD-10-CM | POA: Diagnosis not present

## 2022-12-31 DIAGNOSIS — R109 Unspecified abdominal pain: Secondary | ICD-10-CM | POA: Diagnosis present

## 2022-12-31 DIAGNOSIS — L03311 Cellulitis of abdominal wall: Secondary | ICD-10-CM

## 2022-12-31 DIAGNOSIS — Z8673 Personal history of transient ischemic attack (TIA), and cerebral infarction without residual deficits: Secondary | ICD-10-CM | POA: Insufficient documentation

## 2022-12-31 DIAGNOSIS — R4182 Altered mental status, unspecified: Secondary | ICD-10-CM | POA: Insufficient documentation

## 2022-12-31 LAB — BASIC METABOLIC PANEL
Anion gap: 8 (ref 5–15)
BUN: 38 mg/dL — ABNORMAL HIGH (ref 8–23)
CO2: 25 mmol/L (ref 22–32)
Calcium: 9 mg/dL (ref 8.9–10.3)
Chloride: 105 mmol/L (ref 98–111)
Creatinine, Ser: 1.83 mg/dL — ABNORMAL HIGH (ref 0.44–1.00)
GFR, Estimated: 31 mL/min — ABNORMAL LOW (ref >60)
Glucose, Bld: 164 mg/dL — ABNORMAL HIGH (ref 70–99)
Potassium: 3.4 mmol/L — ABNORMAL LOW (ref 3.5–5.1)
Sodium: 138 mmol/L (ref 135–145)

## 2022-12-31 LAB — CBC WITH DIFFERENTIAL/PLATELET
Abs Immature Granulocytes: 0.12 10*3/uL — ABNORMAL HIGH (ref 0.00–0.07)
Basophils Absolute: 0.1 10*3/uL (ref 0.0–0.1)
Basophils Relative: 0 %
Eosinophils Absolute: 0.2 10*3/uL (ref 0.0–0.5)
Eosinophils Relative: 2 %
HCT: 35.3 % — ABNORMAL LOW (ref 36.0–46.0)
Hemoglobin: 11.5 g/dL — ABNORMAL LOW (ref 12.0–15.0)
Immature Granulocytes: 1 %
Lymphocytes Relative: 7 %
Lymphs Abs: 1.1 10*3/uL (ref 0.7–4.0)
MCH: 26.5 pg (ref 26.0–34.0)
MCHC: 32.6 g/dL (ref 30.0–36.0)
MCV: 81.3 fL (ref 80.0–100.0)
Monocytes Absolute: 0.7 10*3/uL (ref 0.1–1.0)
Monocytes Relative: 5 %
Neutro Abs: 13.3 10*3/uL — ABNORMAL HIGH (ref 1.7–7.7)
Neutrophils Relative %: 85 %
Platelets: 233 10*3/uL (ref 150–400)
RBC: 4.34 MIL/uL (ref 3.87–5.11)
RDW: 17.2 % — ABNORMAL HIGH (ref 11.5–15.5)
WBC: 15.5 10*3/uL — ABNORMAL HIGH (ref 4.0–10.5)
nRBC: 0 % (ref 0.0–0.2)

## 2022-12-31 MED ORDER — CEPHALEXIN 500 MG PO CAPS: 500.0000 mg | ORAL_CAPSULE | Freq: Four times a day (QID) | ORAL | 0 refills | Status: AC

## 2022-12-31 MED ORDER — SODIUM CHLORIDE 0.9 % IV SOLN
1.0000 g | Freq: Once | INTRAVENOUS | Status: AC
  Administered 2022-12-31: 1 g via INTRAVENOUS
  Filled 2022-12-31: qty 10

## 2022-12-31 MED ORDER — SODIUM CHLORIDE 0.9 % IV BOLUS
500.0000 mL | Freq: Once | INTRAVENOUS | Status: AC
  Administered 2022-12-31: 500 mL via INTRAVENOUS

## 2022-12-31 NOTE — ED Triage Notes (Signed)
Skilled Nursing facility [facility]), stated she is having change of mental status, patient is pleasant and answers all questions appropriately.

## 2022-12-31 NOTE — Discharge Instructions (Signed)
Your symptoms are consistent with having a bacterial infection of the skin.  You also have some dehydration for which we have had to give you IV fluids.  You will need to have your family doctor recheck your kidney function within 1 week.  Your creatinine today was 1.8, it should be lower than that when they recheck it, make sure you are drinking plenty of clear liquids  Take cephalexin 4 times a day for the next 10 days to treat the skin infection  Thank you for allowing [place] to treat you in the emergency department today.  After reviewing your examination and potential testing that was done it appears that you are safe to go home.  I would like for you to follow-up with your doctor within the next several days, have them obtain your results and follow-up with them to review all of these tests.  If you should develop severe or worsening symptoms return to the emergency department immediately

## 2022-12-31 NOTE — Progress Notes (Signed)
Verbal report given to skilled nursing facility patient resides at [facility]). [facility] called for transport.

## 2022-12-31 NOTE — ED Provider Notes (Signed)
[facility] Provider Note   CSN: [id number] Arrival date & time: 12/31/22  1204     History  Chief Complaint  Patient presents with   Altered Mental Status    Patient having incoherent statements according to skilled nursing facility reports, on ABT for UTI a few days before arrival, patient answers all questions appropriately.     [name] is a 62 y.o. female.   Altered Mental Status  This patient is a 62 year old female, she has a known history of diabetes, she takes multiple different medications including antihypertensives as well as insulin, she takes levothyroxine for her thyroid, she is currently in a nursing facility at [facility].  She presents to the hospital with a complaint of altered mental status, according to the nursing facility she will have episodes where she does not seem to answer questions appropriately or seem confused or slow to answer and times where she is her normal self.  They have noticed that she has had some redness on her abdominal wall, they have not noticed anything else including no vomiting or diarrhea.  The patient states she has no complaints at this time, the husband does note that there are days where she is better than others about answering questions.  He reports that she seems to be like her normal self right this minute but is concerned because there are times where she will ask the same question over and over    Home Medications Prior to Admission medications   Medication Sig Start Date End Date Taking? Authorizing Provider  cephALEXin (KEFLEX) 500 MG capsule Take 1 capsule (500 mg total) by mouth 4 (four) times daily for 10 days. 12/31/22 01/10/23 Yes Noemi Chapel, MD  ADMELOG 100 UNIT/ML injection Inject 4-8 Units into the skin 3 (three) times daily with meals. If blood glucose is 200-250= 4 units, 250-300= 5 units, 301-350= 6 units, 351-400= 8 units, > 400 notify MD 04/21/22   [provider]  albuterol (VENTOLIN HFA) 108 (90 Base) MCG/ACT inhaler Inhale 2 puffs into the lungs every 6 (six) hours as needed for wheezing or shortness of breath.    [provider]  alum & mag hydroxide-simeth (MAALOX/MYLANTA) 200-200-20 MG/5ML suspension Take 30 mLs by mouth every 2 (two) hours as needed for indigestion or heartburn. Do not exceed 4 doses in 24 hours    [provider]  amLODipine (NORVASC) 10 MG tablet Take 10 mg by mouth daily. 06/28/22   [provider]  ascorbic acid (VITAMIN C) 500 MG tablet Take 500 mg by mouth daily.    [provider]  atorvastatin (LIPITOR) 10 MG tablet Take 10 mg by mouth at bedtime. 09/29/21   [provider]  Cholecalciferol (VITAMIN D) 50 MCG (2000 UT) CAPS Take 2,000 Units by mouth daily.    [provider]  doxycycline (VIBRAMYCIN) 100 MG capsule Take 1 capsule (100 mg total) by mouth every 12 (twelve) hours. 09/06/22   Summerlin, Berneice Heinrich, PA-C  Emollient (EUCERIN) lotion Apply 1 Application topically every 12 (twelve) hours as needed for dry skin.    [provider]  ferrous sulfate 325 (65 FE) MG tablet Take 325 mg by mouth daily.    [provider]  fosfomycin (MONUROL) 3 g PACK Take 3 g by mouth every 3 (three) days. 05/06/22   [provider]  furosemide (LASIX) 20 MG tablet Take 20 mg by mouth daily as needed for edema. 03/29/22  [provider]  gabapentin (NEURONTIN) 300 MG capsule Take 1 capsule three times a day 10/19/22   Cameron Sprang, MD  Glucerna [facility]) LIQD Take 237 mLs by mouth daily. For wound healing    [provider]  hydrALAZINE (APRESOLINE) 25 MG tablet Take 1 tablet (25 mg total) by mouth every 8 (eight) hours. 11/26/21   Johnson, Clanford L, MD  hydrOXYzine (ATARAX/VISTARIL) 10 MG tablet Take 10 mg by mouth every 8 (eight) hours as needed for itching or anxiety. 11/22/19   [provider]  insulin  glargine-yfgn (SEMGLEE) 100 UNIT/ML injection Inject 0.28 mLs (28 Units total) into the skin daily. 08/14/22   Dessa Phi, DO  LANTUS 100 UNIT/ML injection Inject 30 Units into the skin at bedtime. 07/16/22   [provider]  levothyroxine (SYNTHROID) 88 MCG tablet Take 88 mcg by mouth daily before breakfast.    [provider]  loperamide (IMODIUM A-D) 2 MG tablet Take 2 mg by mouth as needed for diarrhea or loose stools. Do not exceed 4 per day    [provider]  magnesium oxide (MAG-OX) 400 (240 Mg) MG tablet Take 400 mg by mouth daily. 11/07/21   [provider]  Menthol-Zinc Oxide (CALMOSEPTINE) 0.44-20.6 % OINT Apply 1 Application topically as needed (preservation/protection after incontinent care).    [provider]  mirabegron ER (MYRBETRIQ) 25 MG TB24 tablet Take 1 tablet (25 mg total) by mouth daily. 09/06/22   Summerlin, Berneice Heinrich, PA-C  Nystatin (GERHARDT'S BUTT CREAM) CREA As directed 11/26/21   Irwin Brakeman L, MD  nystatin (MYCOSTATIN/NYSTOP) powder Apply topically 2 (two) times daily. 11/26/21   Johnson, Clanford L, MD  omeprazole (PRILOSEC) 20 MG capsule Take 20 mg by mouth in the morning and at bedtime.    [provider]  potassium chloride SA (K-DUR) 20 MEQ tablet Take 20 mEq by mouth daily.    [provider]  PROCRIT 4000 UNIT/ML injection Inject 4,000 Units into the skin every Monday, Wednesday, and Friday. Hold if hemoglobin is > 10 08/01/22   [provider]  propranolol (INDERAL) 80 MG tablet Take 80 mg by mouth daily.    [provider]  sertraline (ZOLOFT) 50 MG tablet Take 3 tablets (150 mg total) by mouth daily. 11/27/21   Murlean Iba, MD      Allergies    Carvedilol, Benicar [olmesartan], Codeine, Sulfa antibiotics, and Trulicity [dulaglutide]    Review of Systems   Review of Systems  All other systems reviewed and are negative.   Physical Exam Updated Vital  Signs BP (!) 114/54   Pulse 69   Temp 98.5 F (36.9 C) (Oral)   Resp 14   LMP 08/06/2012   SpO2 97%  Physical Exam Vitals and nursing note reviewed.  Constitutional:      General: She is not in acute distress.    Appearance: She is well-developed.  HENT:     Head: Normocephalic and atraumatic.     Mouth/Throat:     Pharynx: No oropharyngeal exudate.  Eyes:     General: No scleral icterus.       Right eye: No discharge.        Left eye: No discharge.     Conjunctiva/sclera: Conjunctivae normal.     Pupils: Pupils are equal, round, and reactive to light.  Neck:     Thyroid: No thyromegaly.     Vascular: No JVD.  Cardiovascular:     Rate and Rhythm: Normal  rate and regular rhythm.     Heart sounds: Normal heart sounds. No murmur heard.    No friction rub. No gallop.  Pulmonary:     Effort: Pulmonary effort is normal. No respiratory distress.     Breath sounds: Normal breath sounds. No wheezing or rales.  Abdominal:     General: Bowel sounds are normal. There is no distension.     Palpations: Abdomen is soft. There is no mass.     Tenderness: There is no abdominal tenderness.     Comments: Abdominal wall examined, she does have a very large abdominal pannus, from the right side of the abdomen across the umbilicus and towards the left flank there is erythema warmth and induration.  There does not appear to be any focal abscess or fluctuance.  This has a well-defined border and is warm to the touch, it extends to just below the pannus and above the perineum.  This does not involve the vaginal tissue, does not involve the chest  Musculoskeletal:        General: No tenderness. Normal range of motion.     Cervical back: Normal range of motion and neck supple.     Right lower leg: Edema present.     Left lower leg: Edema present.  Lymphadenopathy:     Cervical: No cervical adenopathy.  Skin:    General: Skin is warm and dry.     Findings: No erythema or rash.  Neurological:      Mental Status: She is alert.     Coordination: Coordination normal.  Psychiatric:        Behavior: Behavior normal.     ED Results / Procedures / Treatments   Labs (all labs ordered are listed, but only abnormal results are displayed) Labs Reviewed  CBC WITH DIFFERENTIAL/PLATELET - Abnormal; Notable for the following components:      Result Value   WBC 15.5 (*)    Hemoglobin 11.5 (*)    HCT 35.3 (*)    RDW 17.2 (*)    Neutro Abs 13.3 (*)    Abs Immature Granulocytes 0.12 (*)    All other components within normal limits  BASIC METABOLIC PANEL - Abnormal; Notable for the following components:   Potassium 3.4 (*)    Glucose, Bld 164 (*)    BUN 38 (*)    Creatinine, Ser 1.83 (*)    GFR, Estimated 31 (*)    All other components within normal limits    EKG None  Radiology No results found.  Procedures Procedures    Medications Ordered in ED Medications  cefTRIAXone (ROCEPHIN) 1 g in sodium chloride 0.9 % 100 mL IVPB (1 g Intravenous New Bag/Given 12/31/22 1349)  sodium chloride 0.9 % bolus 500 mL (500 mLs Intravenous New Bag/Given 12/31/22 1352)    ED Course/ Medical Decision Making/ A&P Clinical Course as of 12/31/22 1529  Fri Dec 31, 2022  1417 Blood pressure 211 systolic, heart rate of 69, temperature of 98.5, white blood cell count is 15,500, the creatinine is slightly up, she will get some IV fluids prior to discharge [BM]    Clinical Course User Index [BM] Noemi Chapel, MD                             Medical Decision Making Amount and/or Complexity of Data Reviewed Labs: ordered.  Risk Prescription drug management.   This patient presents to the ED for concern of  some changes in mental status differential diagnosis includes cellulitis, hyperglycemia, electrolyte abnormalities, renal dysfunction, occult infection    Additional history obtained:  Additional history obtained from electronic medical record External records from outside source obtained  and reviewed including multiple prior visits to the office, she has had history of sigmoid cancer, history of TIAs, peripheral vascular disease, essential hypertension, was admitted in September with diabetes   Lab Tests:  I Ordered, and personally interpreted labs.  The pertinent results include: There is a slight elevation in the white blood cell count, renal function is slightly elevated   No signs of necrotizing fasciitis, there is minimal tenderness and no subcutaneous emphysema, imaging deferred  Medicines ordered and prescription drug management:  I ordered medication including Rocephin and IV fluids for dehydration and cellulitis Reevaluation of the patient after these medicines showed that the patient improved I have reviewed the patients home medicines and have made adjustments as needed   Problem List / ED Course:  Cellulitis, will treat as an outpatient, no signs of sepsis, patient agreeable   Social Determinants of Health:  Currently at a nursing facility   Nursing facility will sign back and if worsening, they can get a recheck of kidney function in the outpatient setting           Final Clinical Impression(s) / ED Diagnoses Final diagnoses:  Cellulitis of abdominal wall  Dehydration  Renal insufficiency    Rx / DC Orders ED Discharge Orders          Ordered    cephALEXin (KEFLEX) 500 MG capsule  4 times daily        12/31/22 1527              Noemi Chapel, MD 12/31/22 1529

## 2023-01-25 ENCOUNTER — Inpatient Hospital Stay: Payer: Medicare HMO | Attending: Hematology

## 2023-01-25 DIAGNOSIS — D631 Anemia in chronic kidney disease: Secondary | ICD-10-CM | POA: Diagnosis not present

## 2023-01-25 DIAGNOSIS — I129 Hypertensive chronic kidney disease with stage 1 through stage 4 chronic kidney disease, or unspecified chronic kidney disease: Secondary | ICD-10-CM | POA: Insufficient documentation

## 2023-01-25 DIAGNOSIS — D509 Iron deficiency anemia, unspecified: Secondary | ICD-10-CM | POA: Insufficient documentation

## 2023-01-25 DIAGNOSIS — Z79899 Other long term (current) drug therapy: Secondary | ICD-10-CM | POA: Diagnosis not present

## 2023-01-25 DIAGNOSIS — E1122 Type 2 diabetes mellitus with diabetic chronic kidney disease: Secondary | ICD-10-CM | POA: Insufficient documentation

## 2023-01-25 DIAGNOSIS — N189 Chronic kidney disease, unspecified: Secondary | ICD-10-CM | POA: Insufficient documentation

## 2023-01-25 DIAGNOSIS — E039 Hypothyroidism, unspecified: Secondary | ICD-10-CM | POA: Diagnosis not present

## 2023-01-25 DIAGNOSIS — Z800 Family history of malignant neoplasm of digestive organs: Secondary | ICD-10-CM | POA: Insufficient documentation

## 2023-01-25 DIAGNOSIS — Z794 Long term (current) use of insulin: Secondary | ICD-10-CM | POA: Insufficient documentation

## 2023-01-25 DIAGNOSIS — Z85048 Personal history of other malignant neoplasm of rectum, rectosigmoid junction, and anus: Secondary | ICD-10-CM | POA: Insufficient documentation

## 2023-01-25 DIAGNOSIS — D508 Other iron deficiency anemias: Secondary | ICD-10-CM

## 2023-01-25 DIAGNOSIS — C187 Malignant neoplasm of sigmoid colon: Secondary | ICD-10-CM

## 2023-01-25 LAB — COMPREHENSIVE METABOLIC PANEL
ALT: 13 U/L (ref 0–44)
AST: 17 U/L (ref 15–41)
Albumin: 2.8 g/dL — ABNORMAL LOW (ref 3.5–5.0)
Alkaline Phosphatase: 112 U/L (ref 38–126)
Anion gap: 7 (ref 5–15)
BUN: 20 mg/dL (ref 8–23)
CO2: 31 mmol/L (ref 22–32)
Calcium: 9.4 mg/dL (ref 8.9–10.3)
Chloride: 103 mmol/L (ref 98–111)
Creatinine, Ser: 1.14 mg/dL — ABNORMAL HIGH (ref 0.44–1.00)
GFR, Estimated: 55 mL/min — ABNORMAL LOW (ref >60)
Glucose, Bld: 49 mg/dL — ABNORMAL LOW (ref 70–99)
Potassium: 4 mmol/L (ref 3.5–5.1)
Sodium: 141 mmol/L (ref 135–145)
Total Bilirubin: 0.4 mg/dL (ref 0.3–1.2)
Total Protein: 6.7 g/dL (ref 6.5–8.1)

## 2023-01-25 LAB — CBC WITH DIFFERENTIAL/PLATELET
Abs Immature Granulocytes: 0.03 10*3/uL (ref 0.00–0.07)
Basophils Absolute: 0.1 10*3/uL (ref 0.0–0.1)
Basophils Relative: 1 %
Eosinophils Absolute: 0.6 10*3/uL — ABNORMAL HIGH (ref 0.0–0.5)
Eosinophils Relative: 7 %
HCT: 39.9 % (ref 36.0–46.0)
Hemoglobin: 12.6 g/dL (ref 12.0–15.0)
Immature Granulocytes: 0 %
Lymphocytes Relative: 17 %
Lymphs Abs: 1.4 10*3/uL (ref 0.7–4.0)
MCH: 26.4 pg (ref 26.0–34.0)
MCHC: 31.6 g/dL (ref 30.0–36.0)
MCV: 83.6 fL (ref 80.0–100.0)
Monocytes Absolute: 0.4 10*3/uL (ref 0.1–1.0)
Monocytes Relative: 5 %
Neutro Abs: 5.7 10*3/uL (ref 1.7–7.7)
Neutrophils Relative %: 70 %
Platelets: 323 10*3/uL (ref 150–400)
RBC: 4.77 MIL/uL (ref 3.87–5.11)
RDW: 17.2 % — ABNORMAL HIGH (ref 11.5–15.5)
WBC: 8.1 10*3/uL (ref 4.0–10.5)
nRBC: 0 % (ref 0.0–0.2)

## 2023-01-25 LAB — IRON AND TIBC
Iron: 70 ug/dL (ref 28–170)
Saturation Ratios: 27 % (ref 10.4–31.8)
TIBC: 257 ug/dL (ref 250–450)
UIBC: 187 ug/dL

## 2023-01-25 LAB — FERRITIN: Ferritin: 44 ng/mL (ref 11–307)

## 2023-01-27 LAB — CEA: CEA: 1.8 ng/mL (ref 0.0–4.7)

## 2023-02-01 ENCOUNTER — Inpatient Hospital Stay (HOSPITAL_BASED_OUTPATIENT_CLINIC_OR_DEPARTMENT_OTHER): Payer: Medicare HMO | Admitting: Hematology

## 2023-02-01 VITALS — BP 154/57 | HR 59 | Temp 98.2°F | Resp 20 | Ht 61.0 in | Wt 269.7 lb

## 2023-02-01 DIAGNOSIS — C187 Malignant neoplasm of sigmoid colon: Secondary | ICD-10-CM

## 2023-02-01 DIAGNOSIS — Z85048 Personal history of other malignant neoplasm of rectum, rectosigmoid junction, and anus: Secondary | ICD-10-CM | POA: Diagnosis not present

## 2023-02-01 NOTE — Patient Instructions (Signed)
[facility] at [facility] Discharge Instructions   You were seen and examined today by Dr. Delton Coombes.  He reviewed the results of your lab work which are normal/stable. Your iron and your hemoglobin have improved. Your cancer tumor marker is also normal.   We will see you back in 3 months. We will repeat a CT scan and lab work prior to this visit.    Thank you for choosing [facility] at [facility] to provide your oncology and hematology care.  To afford each patient quality time with our provider, please arrive at least 15 minutes before your scheduled appointment time.   If you have a lab appointment with the [facility] please come in thru the Main Entrance and check in at the main information desk.  You need to re-schedule your appointment should you arrive 10 or more minutes late.  We strive to give you quality time with our providers, and arriving late affects you and other patients whose appointments are after yours.  Also, if you no show three or more times for appointments you may be dismissed from the clinic at the providers discretion.     Again, thank you for choosing [facility].  Our hope is that these requests will decrease the amount of time that you wait before being seen by our physicians.       _____________________________________________________________  Should you have questions after your visit to [facility], please contact our office at [phone] and follow the prompts.  Our office hours are 8:00 a.m. and 4:30 p.m. Monday - Friday.  Please note that voicemails left after 4:00 p.m. may not be returned until the following business day.  We are closed weekends and major holidays.  You do have access to a nurse 24-7, just call the main number to the clinic [phone] and do not press any options, hold on the line and a nurse will answer the phone.    For prescription refill requests, have your  pharmacy contact our office and allow 72 hours.    Due to Covid, you will need to wear a mask upon entering the hospital. If you do not have a mask, a mask will be given to you at the Main Entrance upon arrival. For doctor visits, patients may have 1 support person age 92 or older with them. For treatment visits, patients can not have anyone with them due to social distancing guidelines and our immunocompromised population.

## 2023-02-01 NOTE — Progress Notes (Signed)
[facility] [address], [place] [address]    Clinic Day:  02/01/2023  Referring physician: Bridget Hartshorn, NP  Patient Care Team: Bridget Hartshorn, NP as PCP - General (Adult Health Nurse Practitioner) Danie Binder, MD (Inactive) as Consulting Physician (Gastroenterology) Derek Jack, MD as Medical Oncologist (Medical Oncology) Brien Mates, RN as Oncology Nurse Navigator (Medical Oncology)   ASSESSMENT & PLAN:   Assessment: 1.  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  Rectal mass biopsy consistent with moderately differentiated adenocarcinoma. - CEA (04/06/2022): 25.7. - CT CAP (04/27/2022): No evidence of mass, lymphadenopathy or metastatic disease in the chest, abdomen or pelvis.  Clustered subsolid nodular opacities in the superior segment of the left lower lobe measures 0.6 cm. - Rectal MRI (05/10/2022): Lesion is in the sigmoid colon measuring 4 cm with findings for small volume extension into sigmoid mesocolon.  Intermittent association with adjacent uterus without gross invasion.  Sigmoid mesocolon node, borderline size.  Right external iliac nodes are favored to be reactive. - Low anterior resection (08/11/2022) by Dr. Marcello Moores - Pathology: Moderately differentiated adenocarcinoma, margins negative.  Site of tumor is rectosigmoid junction.  0/14 lymph nodes involved.  Negative LVI and PNI.  PT3 pN0.  MMR preserved.   2.  Social/family history: - She is a resident at [facility] for the past few months due to falls from right foot drop and numbness in the right hand, right foot and lower leg.  Previously she worked at the nursing home.  She is a non-smoker. - Maternal grandmother had colon cancer.    Plan: 1.  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  Stable mild right common iliac lymphadenopathy nonspecific.  Left lower lobe nodular opacities resolved. - LFTs are within normal limits.  CBC was normal.  CEA is 1.8. - No evidence of recurrence.  RTC 3 months with repeat CTAP and CEA levels.   2.  Microcytic anemia: - Anemia from CKD and relative iron deficiency.  She is tolerating iron tablet daily with Metamucil. - Ferritin is 44 and hemoglobin 12.6. - Continue iron tablet daily.  No orders of the defined types were placed in this encounter.     Doyce Loose   2/27/20248:24 AM  CHIEF COMPLAINT:   Diagnosis: stage II rectosigmoid cancer    Cancer Staging  Cancer of sigmoid colon [facility]) Staging form: Colon and Rectum, AJCC 8th Edition - Clinical stage from 05/12/2022: Stage IIA (cT3, cN0, cM0) - Signed by Derek Jack, MD on 09/08/2022    Prior Therapy: Low anterior resection on 08/11/2022    Current Therapy:  Surveillance    HISTORY OF PRESENT ILLNESS:   Oncology History  Cancer of sigmoid colon ([place])  05/12/2022 Initial Diagnosis   Cancer of sigmoid colon ([place])   05/12/2022 Cancer Staging   Staging form: Colon and Rectum, AJCC 8th Edition - Clinical stage from 05/12/2022: Stage IIA (cT3, cN0, cM0) - Signed by Derek Jack, MD on 09/08/2022 Histopathologic type: Adenocarcinoma, NOS Total positive nodes: 0 Histologic grade (G): G2 Histologic grading system: 4 grade system      INTERVAL HISTORY:   [name] is a 62 y.o. female seen for follow-up of colon cancer and iron deficiency anemia.  She is tolerating iron tablet daily with Metamucil.  Denies any bleeding  per rectum or melena.  Energy levels are 75%.  She continues to be resident at [facility].   PAST MEDICAL HISTORY:   Past Medical History: Past Medical History:  Diagnosis Date   Anemia    Arthritis    Asthma    Cancer of sigmoid ([place])    COPD (chronic obstructive pulmonary  disease) ([place])    Depression    Diabetes mellitus    Diastolic dysfunction [id number]   Dyspnea    Generalized weakness    Hyperlipidemia    Hypertension    Hypothyroidism    Obesity    Palpitations    Pneumonia    PONV (postoperative nausea and vomiting)    Prolonged QT interval 05/14/2015   Sleep apnea     Surgical History: Past Surgical History:  Procedure Laterality Date   BIOPSY  04/06/2022   Procedure: BIOPSY;  Surgeon: Harvel Quale, MD;  [facility];  Service: Gastroenterology;;   CATARACT EXTRACTION     CESAREAN SECTION     CHOLECYSTECTOMY     COLONOSCOPY WITH PROPOFOL N/A 04/06/2022   Procedure: COLONOSCOPY WITH PROPOFOL;  Surgeon: Harvel Quale, MD;  [facility];  Service: Gastroenterology;  Laterality: N/A;  805 ASA 2 patient in [place]   ESOPHAGOGASTRODUODENOSCOPY (EGD) WITH PROPOFOL N/A 04/06/2022   Procedure: ESOPHAGOGASTRODUODENOSCOPY (EGD) WITH PROPOFOL;  Surgeon: Harvel Quale, MD;  [facility];  Service: Gastroenterology;  Laterality: N/A;   HEMOSTASIS CLIP PLACEMENT  04/06/2022   Procedure: HEMOSTASIS CLIP PLACEMENT;  Surgeon: Harvel Quale, MD;  [facility];  Service: Gastroenterology;;   POLYPECTOMY  04/06/2022   Procedure: POLYPECTOMY;  Surgeon: Montez Morita, Quillian Quince, MD;  [facility];  Service: Gastroenterology;;   SUBMUCOSAL TATTOO INJECTION  04/06/2022   Procedure: SUBMUCOSAL TATTOO INJECTION;  Surgeon: Montez Morita, Quillian Quince, MD;  [facility];  Service: Gastroenterology;;    Social History: Social History   Socioeconomic History   Marital status: Married    Spouse name: Not on file   Number of children: Not on file   Years of education: Not on file   Highest education level: Not on file  Occupational History   Not on file  Tobacco Use   Smoking status: Never    Passive exposure: Yes   Smokeless tobacco:  Never  Vaping Use   Vaping Use: Never used  Substance and Sexual Activity   Alcohol use: No    Alcohol/week: 0.0 standard drinks of alcohol   Drug use: No   Sexual activity: Yes    Birth control/protection: None  Other Topics Concern   Not on file  Social History Narrative   Lives with husband.  One living son.  Daughter died after transplant age 21.     Are you right handed or left handed? Right   Are you currently employed ? 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    What type of home do you live in: 1 story or 2 story? one       Social Determinants of Health   Financial Resource Strain: Not on file  Food Insecurity: Unknown (08/12/2022)   Hunger Vital Sign    Worried About Running Out of Food in the Last Year: Patient refused    [place] in the Last Year: Patient refused  Transportation Needs: No Transportation Needs (08/12/2022)  PRAPARE - Hydrologist (Medical): No    Lack of Transportation (Non-Medical): No  Physical Activity: Not on file  Stress: Not on file  Social Connections: Not on file  Intimate Partner Violence: Unknown (08/12/2022)   Humiliation, Afraid, Rape, and Kick questionnaire    Fear of Current or Ex-Partner: No    Emotionally Abused: No    Physically Abused: Not on file    Sexually Abused: Not on file    Family History: Family History  Problem Relation Age of Onset   Stroke Mother    Diabetes Father    Heart failure Father    Hypertension Father    Diabetes Sister    Heart failure Sister    Hypertension Sister    Stroke Sister    Cancer Other    Stroke Sister     Current Medications:  Current Outpatient Medications:    ADMELOG 100 UNIT/ML injection, Inject 4-8 Units into the skin 3 (three) times daily with meals. If blood glucose is 200-250= 4 units, 250-300= 5 units, 301-350= 6 units, 351-400= 8 units, > 400 notify MD, Disp: , Rfl:    albuterol  (VENTOLIN HFA) 108 (90 Base) MCG/ACT inhaler, Inhale 2 puffs into the lungs every 6 (six) hours as needed for wheezing or shortness of breath., Disp: , Rfl:    alum & mag hydroxide-simeth (MAALOX/MYLANTA) [id number] MG/5ML suspension, Take 30 mLs by mouth every 2 (two) hours as needed for indigestion or heartburn. Do not exceed 4 doses in 24 hours, Disp: , Rfl:    amLODipine (NORVASC) 10 MG tablet, Take 10 mg by mouth daily., Disp: , Rfl:    ascorbic acid (VITAMIN C) 500 MG tablet, Take 500 mg by mouth daily., Disp: , Rfl:    atorvastatin (LIPITOR) 10 MG tablet, Take 10 mg by mouth at bedtime., Disp: , Rfl:    Cholecalciferol (VITAMIN D) 50 MCG (2000 UT) CAPS, Take 2,000 Units by mouth daily., Disp: , Rfl:    doxycycline (VIBRAMYCIN) 100 MG capsule, Take 1 capsule (100 mg total) by mouth every 12 (twelve) hours., Disp: 14 capsule, Rfl: 0   Emollient (EUCERIN) lotion, Apply 1 Application topically every 12 (twelve) hours as needed for dry skin., Disp: , Rfl:    ferrous sulfate 325 (65 FE) MG tablet, Take 325 mg by mouth daily., Disp: , Rfl:    fosfomycin (MONUROL) 3 g PACK, Take 3 g by mouth every 3 (three) days., Disp: , Rfl:    furosemide (LASIX) 20 MG tablet, Take 20 mg by mouth daily as needed for edema., Disp: , Rfl:    gabapentin (NEURONTIN) 300 MG capsule, Take 1 capsule three times a day, Disp: 90 capsule, Rfl: 11   Glucerna (GLUCERNA) LIQD, Take 237 mLs by mouth daily. For wound healing, Disp: , Rfl:    hydrALAZINE (APRESOLINE) 25 MG tablet, Take 1 tablet (25 mg total) by mouth every 8 (eight) hours., Disp: , Rfl:    hydrOXYzine (ATARAX/VISTARIL) 10 MG tablet, Take 10 mg by mouth every 8 (eight) hours as needed for itching or anxiety., Disp: , Rfl:    insulin glargine-yfgn (SEMGLEE) 100 UNIT/ML injection, Inject 0.28 mLs (28 Units total) into the skin daily., Disp: , Rfl:    LANTUS 100 UNIT/ML injection, Inject 30 Units into the skin at bedtime., Disp: , Rfl:    levothyroxine (SYNTHROID) 88  MCG tablet, Take 88 mcg by mouth daily before breakfast., Disp: , Rfl:    loperamide (IMODIUM A-D) 2  MG tablet, Take 2 mg by mouth as needed for diarrhea or loose stools. Do not exceed 4 per day, Disp: , Rfl:    magnesium oxide (MAG-OX) 400 (240 Mg) MG tablet, Take 400 mg by mouth daily., Disp: , Rfl:    Menthol-Zinc Oxide (CALMOSEPTINE) 0.44-20.6 % OINT, Apply 1 Application topically as needed (preservation/protection after incontinent care)., Disp: , Rfl:    mirabegron ER (MYRBETRIQ) 25 MG TB24 tablet, Take 1 tablet (25 mg total) by mouth daily., Disp: 30 tablet, Rfl: 3   Nystatin (GERHARDT'S BUTT CREAM) CREA, As directed, Disp: , Rfl:    nystatin (MYCOSTATIN/NYSTOP) powder, Apply topically 2 (two) times daily., Disp: 15 g, Rfl: 0   omeprazole (PRILOSEC) 20 MG capsule, Take 20 mg by mouth in the morning and at bedtime., Disp: , Rfl:    potassium chloride SA (K-DUR) 20 MEQ tablet, Take 20 mEq by mouth daily., Disp: , Rfl:    PROCRIT 4000 UNIT/ML injection, Inject 4,000 Units into the skin every Monday, Wednesday, and Friday. Hold if hemoglobin is > 10, Disp: , Rfl:    propranolol (INDERAL) 80 MG tablet, Take 80 mg by mouth daily., Disp: , Rfl:    sertraline (ZOLOFT) 50 MG tablet, Take 3 tablets (150 mg total) by mouth daily., Disp: , Rfl:    Allergies: Allergies  Allergen Reactions   Carvedilol Palpitations   Benicar [Olmesartan] Swelling   Codeine Other (See Comments)    Confusion    Sulfa Antibiotics Swelling    Whole face swells   Trulicity [Dulaglutide] Diarrhea    REVIEW OF SYSTEMS:   Review of Systems  Cardiovascular:  Positive for palpitations.  Neurological:  Positive for numbness.  All other systems reviewed and are negative.    VITALS:   Last menstrual period 08/06/2012.  Wt Readings from Last 3 Encounters:  10/26/22 261 lb (118.4 kg)  10/19/22 260 lb (117.9 kg)  09/08/22 263 lb 6.4 oz (119.5 kg)    There is no height or weight on file to calculate  BMI.  Performance status (ECOG): 1 - Symptomatic but completely ambulatory  PHYSICAL EXAM:   Physical Exam Vitals reviewed.  Constitutional:      Appearance: Normal appearance.  Cardiovascular:     Rate and Rhythm: Normal rate and regular rhythm.     Heart sounds: Normal heart sounds.  Pulmonary:     Effort: Pulmonary effort is normal.     Breath sounds: Normal breath sounds.  Neurological:     Mental Status: She is alert.  Psychiatric:        Mood and Affect: Mood normal.        Behavior: Behavior normal.     LABS:      Latest Ref Rng & Units 01/25/2023   11:15 AM 12/31/2022    1:32 PM 10/13/2022   12:32 PM  CBC  WBC 4.0 - 10.5 K/uL 8.1  15.5  10.7   Hemoglobin 12.0 - 15.0 g/dL 12.6  11.5  12.7   Hematocrit 36.0 - 46.0 % 39.9  35.3  40.4   Platelets 150 - 400 K/uL 323  233  343       Latest Ref Rng & Units 01/25/2023   11:15 AM 12/31/2022    1:32 PM 10/13/2022   12:32 PM  CMP  Glucose 70 - 99 mg/dL 49  164  158   BUN 8 - 23 mg/dL 20  38  24   Creatinine 0.44 - 1.00 mg/dL 1.14  1.83  1.22  Sodium 135 - 145 mmol/L 141  138  140   Potassium 3.5 - 5.1 mmol/L 4.0  3.4  4.0   Chloride 98 - 111 mmol/L 103  105  106   CO2 22 - 32 mmol/L '31  25  27   '$ Calcium 8.9 - 10.3 mg/dL 9.4  9.0  9.4   Total Protein 6.5 - 8.1 g/dL 6.7   6.8   Total Bilirubin 0.3 - 1.2 mg/dL 0.4   0.4   Alkaline Phos 38 - 126 U/L 112   106   AST 15 - 41 U/L 17   17   ALT 0 - 44 U/L 13   13      Lab Results  Component Value Date   CEA1 1.8 01/25/2023   /  CEA  Date Value Ref Range Status  01/25/2023 1.8 0.0 - 4.7 ng/mL Final    Comment:    (NOTE)                             Nonsmokers          <3.9                             Smokers             <5.6 [employer] Electrochemiluminescence Immunoassay (ECLIA) Values obtained with different assay methods or kits cannot be used interchangeably.  Results cannot be interpreted as absolute evidence of the presence or absence of malignant  disease. Performed At: [facility] [place], [place] [id number] Rush Farmer MD [id number]    No results found for: "PSA1" No results found for: "CAN199" No results found for: "CAN125"  No results found for: "TOTALPROTELP", "ALBUMINELP", "A1GS", "A2GS", "BETS", "BETA2SER", "GAMS", "MSPIKE", "SPEI" Lab Results  Component Value Date   TIBC 257 01/25/2023   TIBC 257 10/13/2022   TIBC 183 (L) 11/25/2021   FERRITIN 44 01/25/2023   FERRITIN 18 10/13/2022   FERRITIN 75 11/25/2021   IRONPCTSAT 27 01/25/2023   IRONPCTSAT 28 10/13/2022   IRONPCTSAT 8 (L) 11/25/2021   No results found for: "LDH"   STUDIES:   No results found.

## 2023-03-21 ENCOUNTER — Ambulatory Visit (INDEPENDENT_AMBULATORY_CARE_PROVIDER_SITE_OTHER): Payer: Medicare HMO | Admitting: Neurology

## 2023-03-21 ENCOUNTER — Encounter: Payer: Self-pay | Admitting: Neurology

## 2023-03-21 VITALS — BP 154/76 | HR 62 | Ht 61.0 in | Wt 274.2 lb

## 2023-03-21 DIAGNOSIS — G5601 Carpal tunnel syndrome, right upper limb: Secondary | ICD-10-CM

## 2023-03-21 DIAGNOSIS — G629 Polyneuropathy, unspecified: Secondary | ICD-10-CM

## 2023-03-21 MED ORDER — GABAPENTIN 300 MG PO CAPS: ORAL_CAPSULE | ORAL | 11 refills | Status: DC

## 2023-03-21 NOTE — Patient Instructions (Signed)
Good to see you.  Increase Gabapentin 300mg : take 1 capsule in AM, 2 capsules in PM  2. Referral will be sent to the hand surgeon for the carpal tunnel syndrome  3. Please discuss other options for the right foot brace (?extra padding, shoe lift) to help with the foot drop  4. Use walker at all times  5. Follow-up in 6 months, call for any changes

## 2023-03-21 NOTE — Progress Notes (Signed)
NEUROLOGY FOLLOW UP OFFICE NOTE  [name] [id number] [birth year]  HISTORY OF PRESENT ILLNESS: I had the pleasure of seeing [name] in follow-up in the neurology clinic on 03/21/2023. She is again accompanied by her husband who helps supplement the history today. The patient was last seen 5 months ago for dizziness described as a sensation of imbalance with frequent falls. Records and images were personally reviewed where available.  Exam showed right foot drop. 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 It also showed very severe right medial neuropathy at the wrist. She had an MRI lumbar spine without contrast 12/2022 which did not show any significant stenosis, there was mild disc bulging and facet hypertrophy, chronic fatty atrophy of the erector spinae musculature bilaterally.  Her PCP Dr. Antonietta Jewel at [facility] facility contacted our office on 11/03/22 to report fairly sudden onset confusion, numbness of tongue and mouth, loss of coordination of right hand for 30 minutes. Glucose in the upper 70s. Brain MRI without contrast done 11/2022 no acute changes, there was chronic microvascular disease and volume loss. She was started on daily aspirin 81mg .  Since her last visit, they continue to deny any falls since December 2022. She ambulates with a walker at [facility], her husband reports she does not walk much. She was given an AFO for the right foot drop but developed a sore from the brace on her right calf. She feels it did help with ambulation. She continues to report burning sensations in her feet and right hand. She is on Gabapentin 300mg  BID, she reports dose was not increased as instructed last time, no side effects.  She states her sugar levels are a whole lot better but she does not know the values.    History on Initial Assessment 10/19/2022: This is a very pleasant 62 year old right-handed woman with a history of DM, hypertension, hyperlipidemia, hypothyroidism, COPD, colon cancer, presenting for evaluation of dizziness. She was recently evaluated by ENT, no signs of peripheral cause of vertigo, suspect central cause of dizziness. She and her husband report that symptoms started around 4 years ago when she suddenly started having frequent falls. She describes the dizziness as more of a balance issue. She denies any vertigo or lightheadedness. Symptoms only occur when standing, she does not feel "dizzy" when supine or sitting. She denies any headaches, double vision, difficulty swallowing. No neck or back pain. They report her last fall was in December 2022, at that time her right leg went numb and she fell. She has been at [facility] since February 62 due to the frequent falls. They report she has not fallen since then because she has not done much walking, she is mostly in the wheelchair or uses a walker. She has had numbness in both feet and her right hand for several years. She describes constant numbness and tingling in her feet and right hand, with stabbing pain in her feet and achiness in her right hand. She cannot flex her right foot and states her toes are curling in. She has been diabetic "since age 62." She states glucose levels are "bad," she thinks her last HbA1c was 9 (7.2 in 07/2022). At the SNF, glucose levels go up to 300, but are low when she wakes up.  She reports insulin dose was changed recently and she is on a strict diet. She has been doing physical therapy for the past 6 months but recently finished.   She had a head CT in 12/2021 ordered for altered mental status in the setting of hypoglycemia ("glucose <10"), no acute changes, there was atrophy and chronic microvascular disease.  PAST  MEDICAL HISTORY: Past Medical History:  Diagnosis Date   Anemia    Arthritis    Asthma    Cancer of sigmoid (HCC)    COPD (chronic obstructive pulmonary disease) (HCC)    Depression    Diabetes mellitus    Diastolic dysfunction 05/15/2015   Dyspnea    Generalized weakness    Hyperlipidemia    Hypertension    Hypothyroidism    Obesity    Palpitations    Pneumonia    PONV (postoperative nausea and vomiting)    Prolonged QT interval 05/14/2015   Sleep apnea     MEDICATIONS: Current Outpatient Medications on File Prior to Visit  Medication Sig Dispense Refill   ADMELOG 100 UNIT/ML injection Inject 4-8 Units into the skin 3 (three) times daily with meals. If blood glucose is 200-250= 4 units, 250-300= 5 units, 301-350= 6 units, 351-400= 8 units, > 400 notify MD     albuterol (VENTOLIN HFA) 108 (90 Base) MCG/ACT inhaler Inhale 2 puffs into the lungs every 6 (six) hours as needed for wheezing or shortness of breath.     alum & mag hydroxide-simeth (MAALOX/MYLANTA) 200-200-20 MG/5ML suspension Take 30 mLs by mouth every 2 (two) hours as needed for indigestion or heartburn. Do not exceed 4 doses in 24 hours     amLODipine (NORVASC) 10 MG tablet Take 10 mg by mouth daily.     ascorbic acid (VITAMIN C) 500 MG tablet Take 500 mg by mouth daily.     atorvastatin (LIPITOR) 10 MG tablet Take 10 mg by mouth at bedtime.     Cholecalciferol (VITAMIN D) 50 MCG (2000 UT) CAPS Take 2,000 Units by mouth daily.     doxycycline (VIBRAMYCIN) 100 MG capsule Take 1 capsule (100 mg total) by mouth every 12 (twelve) hours. 14 capsule 0   Emollient (EUCERIN) lotion Apply 1 Application topically every 12 (twelve) hours as needed for dry skin.     ferrous sulfate 325 (65 FE) MG tablet Take 325 mg by mouth daily.     fosfomycin (MONUROL) 3 g PACK Take 3 g by mouth every 3 (three) days.     furosemide (LASIX) 20 MG tablet Take 20 mg by mouth daily as needed for edema.     gabapentin (NEURONTIN) 300 MG capsule  Take 1 capsule three times a day 90 capsule 11   Glucerna (GLUCERNA) LIQD Take 237 mLs by mouth daily. For wound healing     hydrALAZINE (APRESOLINE) 25 MG tablet Take 1 tablet (25 mg total) by mouth every 8 (eight) hours.     hydrOXYzine (ATARAX/VISTARIL) 10 MG tablet Take 10 mg by mouth every 8 (eight) hours as needed for itching or anxiety.     insulin glargine-yfgn (SEMGLEE) 100 UNIT/ML injection Inject 0.28 mLs (28 Units total) into the skin daily.     LANTUS 100 UNIT/ML injection Inject 30 Units into the skin at bedtime.     levothyroxine (SYNTHROID) 88 MCG tablet Take 88 mcg by mouth daily before breakfast.     loperamide (IMODIUM A-D) 2 MG tablet Take 2 mg by mouth as needed for diarrhea or loose stools.  Do not exceed 4 per day     magnesium oxide (MAG-OX) 400 (240 Mg) MG tablet Take 400 mg by mouth daily.     Menthol-Zinc Oxide (CALMOSEPTINE) 0.44-20.6 % OINT Apply 1 Application topically as needed (preservation/protection after incontinent care).     mirabegron ER (MYRBETRIQ) 25 MG TB24 tablet Take 1 tablet (25 mg total) by mouth daily. 30 tablet 3   Nystatin (GERHARDT'S BUTT CREAM) CREA As directed     nystatin (MYCOSTATIN/NYSTOP) powder Apply topically 2 (two) times daily. 15 g 0   omeprazole (PRILOSEC) 20 MG capsule Take 20 mg by mouth in the morning and at bedtime.     potassium chloride SA (K-DUR) 20 MEQ tablet Take 20 mEq by mouth daily.     PROCRIT 4000 UNIT/ML injection Inject 4,000 Units into the skin every Monday, Wednesday, and Friday. Hold if hemoglobin is > 10     propranolol (INDERAL) 80 MG tablet Take 80 mg by mouth daily.     sertraline (ZOLOFT) 50 MG tablet Take 3 tablets (150 mg total) by mouth daily.     No current facility-administered medications on file prior to visit.    ALLERGIES: Allergies  Allergen Reactions   Carvedilol Palpitations   Benicar [Olmesartan] Swelling   Codeine Other (See Comments)    Confusion    Sulfa Antibiotics Swelling    Whole face  swells   Trulicity [Dulaglutide] Diarrhea    FAMILY HISTORY: Family History  Problem Relation Age of Onset   Stroke Mother    Diabetes Father    Heart failure Father    Hypertension Father    Diabetes Sister    Heart failure Sister    Hypertension Sister    Stroke Sister    Cancer Other    Stroke Sister     SOCIAL HISTORY: Social History   Socioeconomic History   Marital status: Married    Spouse name: Not on file   Number of children: Not on file   Years of education: Not on file   Highest education level: Not on file  Occupational History   Not on file  Tobacco Use   Smoking status: Never    Passive exposure: Yes   Smokeless tobacco: Never  Vaping Use   Vaping Use: Never used  Substance and Sexual Activity   Alcohol use: No    Alcohol/week: 0.0 standard drinks of alcohol   Drug use: No   Sexual activity: Yes    Birth control/protection: None  Other Topics Concern   Not on file  Social History Narrative   Lives with husband.  One living son.  Daughter died after transplant age 34.     Are you right handed or left handed? Right   Are you currently employed ? 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    What type of home do you live in: 1 story or 2 story? one       Social Determinants of Health   Financial Resource Strain: Not on file  Food Insecurity: Patient Declined (08/12/2022)   Hunger Vital Sign    Worried About Running Out of Food in the Last Year: Patient declined    Ran Out of Food in the Last Year: Patient declined  Transportation Needs: No Transportation Needs (08/12/2022)   PRAPARE - Administrator, Civil Service (Medical): No    Lack of Transportation (Non-Medical): No  Physical Activity:  Not on file  Stress: Not on file  Social Connections: Not on file  Intimate Partner Violence: Unknown (08/12/2022)   Humiliation, Afraid, Rape, and Kick questionnaire    Fear of Current or  Ex-Partner: No    Emotionally Abused: No    Physically Abused: Not on file    Sexually Abused: Not on file     PHYSICAL EXAM: Vitals:   03/21/23 1117  BP: (!) 154/76  Pulse: 62  SpO2: 96%   General: No acute distress, sitting on a wheelchair Head:  Normocephalic/atraumatic Skin/Extremities: No rash, no edema. Atrophy on right thenar eminence Neurological Exam: alert and awake. No aphasia or dysarthria. Fund of knowledge is appropriate.  Attention and concentration are normal.   Cranial nerves: Pupils equal, round. Extraocular movements intact with no nystagmus. Visual fields full.  No facial asymmetry.  Motor: Bulk and tone normal, muscle strength 5/5 on both UE, left LE, 4/5 right foot flexion, 2/5 right foot dorsiflexion. Finger to nose testing intact.  Gait not tested, uses a walker at SNF.   IMPRESSION: This is a very pleasant 62 yo RH woman with a history of DM, hypertension, hyperlipidemia, hypothyroidism, COPD, colon cancer, who presented for dizziness described as sensation of imbalance, likely due to severe neuropathy. EMG/NCV findings discussed today, it showed severe neuropathy in both lower extremities. Discussed diagnosis, prognosis, symptomatic treatment with right AFO, walker at all times. We can increase Gabapentin to  in AM,  in PM for neuropathic pain. She also reports pain on right hand, EMG showed very severe right carpal tunnel syndrome, referral to Hand Surgery will be sent. Continue working on glucose control. She was advised to speak with PT regarding the AFO issues. Follow-up in 6 months, call for any changes.    Thank you for allowing me to participate in her care.  Please do not hesitate to call for any questions or concerns.    Patrcia Dolly, M.D.   CC: Sharon Seller, NP

## 2023-04-29 ENCOUNTER — Telehealth: Payer: Self-pay | Admitting: Neurology

## 2023-04-29 NOTE — Telephone Encounter (Signed)
Referral re faxed.

## 2023-04-29 NOTE — Telephone Encounter (Signed)
[facility] called in to follow up on the referral for the pt's Hydrograph[employer]. She called them and they do not have the referral. She is requesting it be sent in again.

## 2023-05-03 ENCOUNTER — Inpatient Hospital Stay: Payer: Medicare HMO

## 2023-05-03 ENCOUNTER — Other Ambulatory Visit: Payer: Medicare HMO

## 2023-05-03 ENCOUNTER — Ambulatory Visit (HOSPITAL_COMMUNITY)
Admission: RE | Admit: 2023-05-03 | Discharge: 2023-05-03 | Disposition: A | Discharge status: Home / Self Care | Payer: Medicare HMO | Source: Ambulatory Visit | Attending: Hematology | Admitting: Hematology

## 2023-05-03 DIAGNOSIS — C187 Malignant neoplasm of sigmoid colon: Secondary | ICD-10-CM | POA: Insufficient documentation

## 2023-05-03 LAB — POCT I-STAT CREATININE: Creatinine, Ser: 1.5 mg/dL — ABNORMAL HIGH (ref 0.44–1.00)

## 2023-05-03 MED ORDER — IOHEXOL 300 MG/ML  SOLN
100.0000 mL | Freq: Once | INTRAMUSCULAR | Status: AC | PRN
  Administered 2023-05-03: 100 mL via INTRAVENOUS

## 2023-05-03 MED ORDER — IOHEXOL 9 MG/ML PO SOLN
ORAL | Status: AC
  Filled 2023-05-03: qty 500

## 2023-05-04 ENCOUNTER — Inpatient Hospital Stay: Payer: Medicare HMO | Attending: Hematology

## 2023-05-04 DIAGNOSIS — D631 Anemia in chronic kidney disease: Secondary | ICD-10-CM | POA: Insufficient documentation

## 2023-05-04 DIAGNOSIS — D509 Iron deficiency anemia, unspecified: Secondary | ICD-10-CM | POA: Insufficient documentation

## 2023-05-04 DIAGNOSIS — N189 Chronic kidney disease, unspecified: Secondary | ICD-10-CM | POA: Insufficient documentation

## 2023-05-04 DIAGNOSIS — Z85038 Personal history of other malignant neoplasm of large intestine: Secondary | ICD-10-CM | POA: Diagnosis not present

## 2023-05-04 DIAGNOSIS — Z85048 Personal history of other malignant neoplasm of rectum, rectosigmoid junction, and anus: Secondary | ICD-10-CM | POA: Diagnosis present

## 2023-05-04 DIAGNOSIS — C187 Malignant neoplasm of sigmoid colon: Secondary | ICD-10-CM

## 2023-05-04 LAB — CBC WITH DIFFERENTIAL/PLATELET
Abs Immature Granulocytes: 0.05 10*3/uL (ref 0.00–0.07)
Basophils Absolute: 0.1 10*3/uL (ref 0.0–0.1)
Basophils Relative: 1 %
Eosinophils Absolute: 0.7 10*3/uL — ABNORMAL HIGH (ref 0.0–0.5)
Eosinophils Relative: 8 %
HCT: 37.7 % (ref 36.0–46.0)
Hemoglobin: 12.3 g/dL (ref 12.0–15.0)
Immature Granulocytes: 1 %
Lymphocytes Relative: 15 %
Lymphs Abs: 1.4 10*3/uL (ref 0.7–4.0)
MCH: 27.5 pg (ref 26.0–34.0)
MCHC: 32.6 g/dL (ref 30.0–36.0)
MCV: 84.3 fL (ref 80.0–100.0)
Monocytes Absolute: 0.4 10*3/uL (ref 0.1–1.0)
Monocytes Relative: 4 %
Neutro Abs: 6.6 10*3/uL (ref 1.7–7.7)
Neutrophils Relative %: 71 %
Platelets: 359 10*3/uL (ref 150–400)
RBC: 4.47 MIL/uL (ref 3.87–5.11)
RDW: 14.4 % (ref 11.5–15.5)
WBC: 9.2 10*3/uL (ref 4.0–10.5)
nRBC: 0 % (ref 0.0–0.2)

## 2023-05-04 LAB — COMPREHENSIVE METABOLIC PANEL
ALT: 16 U/L (ref 0–44)
AST: 16 U/L (ref 15–41)
Albumin: 2.7 g/dL — ABNORMAL LOW (ref 3.5–5.0)
Alkaline Phosphatase: 138 U/L — ABNORMAL HIGH (ref 38–126)
Anion gap: 13 (ref 5–15)
BUN: 29 mg/dL — ABNORMAL HIGH (ref 8–23)
CO2: 23 mmol/L (ref 22–32)
Calcium: 9 mg/dL (ref 8.9–10.3)
Chloride: 101 mmol/L (ref 98–111)
Creatinine, Ser: 1.39 mg/dL — ABNORMAL HIGH (ref 0.44–1.00)
GFR, Estimated: 43 mL/min — ABNORMAL LOW (ref >60)
Glucose, Bld: 301 mg/dL — ABNORMAL HIGH (ref 70–99)
Potassium: 4.3 mmol/L (ref 3.5–5.1)
Sodium: 137 mmol/L (ref 135–145)
Total Bilirubin: 0.5 mg/dL (ref 0.3–1.2)
Total Protein: 6.6 g/dL (ref 6.5–8.1)

## 2023-05-04 LAB — IRON AND TIBC
Iron: 94 ug/dL (ref 28–170)
Saturation Ratios: 42 % — ABNORMAL HIGH (ref 10.4–31.8)
TIBC: 225 ug/dL — ABNORMAL LOW (ref 250–450)
UIBC: 131 ug/dL

## 2023-05-04 LAB — FERRITIN: Ferritin: 47 ng/mL (ref 11–307)

## 2023-05-05 LAB — CEA: CEA: 1.9 ng/mL (ref 0.0–4.7)

## 2023-05-09 ENCOUNTER — Inpatient Hospital Stay: Payer: Medicare HMO | Attending: Hematology | Admitting: Hematology

## 2023-05-09 VITALS — BP 144/64 | HR 60 | Temp 98.2°F | Resp 16 | Wt 285.5 lb

## 2023-05-09 DIAGNOSIS — Z79899 Other long term (current) drug therapy: Secondary | ICD-10-CM | POA: Diagnosis not present

## 2023-05-09 DIAGNOSIS — D631 Anemia in chronic kidney disease: Secondary | ICD-10-CM | POA: Insufficient documentation

## 2023-05-09 DIAGNOSIS — K5900 Constipation, unspecified: Secondary | ICD-10-CM | POA: Diagnosis not present

## 2023-05-09 DIAGNOSIS — N189 Chronic kidney disease, unspecified: Secondary | ICD-10-CM | POA: Insufficient documentation

## 2023-05-09 DIAGNOSIS — Z85048 Personal history of other malignant neoplasm of rectum, rectosigmoid junction, and anus: Secondary | ICD-10-CM | POA: Diagnosis present

## 2023-05-09 DIAGNOSIS — D509 Iron deficiency anemia, unspecified: Secondary | ICD-10-CM | POA: Diagnosis not present

## 2023-05-09 DIAGNOSIS — C187 Malignant neoplasm of sigmoid colon: Secondary | ICD-10-CM | POA: Diagnosis not present

## 2023-05-09 DIAGNOSIS — Z7982 Long term (current) use of aspirin: Secondary | ICD-10-CM | POA: Diagnosis not present

## 2023-05-09 NOTE — Progress Notes (Signed)
[facility] [address] [place] [address]    Clinic Day:  05/09/2023  Referring physician: [name], NP  Patient Care Team: [name], NP as PCP - General (Adult Health Nurse Practitioner) [place] Bali, MD (Inactive) as Consulting Physician (Gastroenterology) Doreatha Massed, MD as Medical Oncologist (Medical Oncology) Therese Sarah, RN as Oncology Nurse Navigator (Medical Oncology) Van Clines, MD as Consulting Physician (Neurology)   ASSESSMENT & PLAN:   Assessment: 1.  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  Rectal mass biopsy consistent with moderately differentiated adenocarcinoma. - CEA (04/06/2022): 25.7. - CT CAP (04/27/2022): No evidence of mass, lymphadenopathy or metastatic disease in the chest, abdomen or pelvis.  Clustered subsolid nodular opacities in the superior segment of the left lower lobe measures 0.6 cm. - Rectal MRI (05/10/2022): Lesion is in the sigmoid colon measuring 4 cm with findings for small volume extension into sigmoid mesocolon.  Intermittent association with adjacent uterus without gross invasion.  Sigmoid mesocolon node, borderline size.  Right external iliac nodes are favored to be reactive. - Low anterior resection (08/11/2022) by Dr. Maisie Fus - Pathology: Moderately differentiated adenocarcinoma, margins negative.  Site of tumor is rectosigmoid junction.  0/14 lymph nodes involved.  Negative LVI and PNI.  PT3 pN0.  MMR preserved.   2.  Social/family history: - She is a resident at [facility] nursing home for the past few months due to falls from right foot drop and numbness in the right hand, right foot and lower leg.  Previously she worked at the nursing home.  She is a non-smoker. - Maternal grandmother had colon cancer.    Plan: 1.  Stage II (T3 N0 M0)  rectosigmoid junction adenocarcinoma, MMR preserved: - CT CAP on 05/03/2023: Mildly enlarged right common iliac lymph node measures 9 mm.  Ill-defined hypodensity along the falciform ligament is slightly more pronounced than on prior exam, likely reflecting focal fatty infiltration. - Labs from 05/04/2023: Alk phos elevated at 138.  Creatinine 1.39.  Rest of LFTs normal.  CBC grossly normal.  CEA is 1.9. - Recommend follow-up in 3 months with repeat CEA.   2.  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 - Continue iron tablet daily.  Hemoglobin is 12.3.  Ferritin is 47. - She complains of constipation.  Will add Colace daily.    Orders Placed This Encounter  Procedures   CBC with Differential    Standing Status:   Future    Standing Expiration Date:   05/08/2024   Comprehensive metabolic panel    Standing Status:   Future    Standing Expiration Date:   05/08/2024   CEA    Standing Status:   Future    Standing Expiration Date:   05/08/2024      I,Katie Daubenspeck,acting as a scribe for Doreatha Massed, MD.,have documented all relevant documentation on the behalf of Doreatha Massed, MD,as directed by  Doreatha Massed, MD while in the presence of Doreatha Massed, MD.   I, Doreatha Massed MD, have reviewed the above documentation for accuracy and completeness, and I agree with the above.   Doreatha Massed, MD   6/3/20246:18 PM  CHIEF COMPLAINT:   Diagnosis: stage II rectosigmoid cancer    Cancer Staging  Cancer of sigmoid colon [facility]) Staging form: Colon and Rectum, AJCC 8th Edition - Clinical stage from 05/12/2022: Stage IIA (cT3,  cN0, cM0) - Signed by Doreatha Massed, MD on 09/08/2022    Prior Therapy: Low anterior resection on 08/11/2022   Current Therapy:  Surveillance    HISTORY OF PRESENT ILLNESS:   Oncology History  Cancer of sigmoid colon (HCC)  05/12/2022 Initial Diagnosis   Cancer of sigmoid colon (HCC)   05/12/2022  Cancer Staging   Staging form: Colon and Rectum, AJCC 8th Edition - Clinical stage from 05/12/2022: Stage IIA (cT3, cN0, cM0) - Signed by Doreatha Massed, MD on 09/08/2022 Histopathologic type: Adenocarcinoma, NOS Total positive nodes: 0 Histologic grade (G): G2 Histologic grading system: 4 grade system      INTERVAL HISTORY:   [name] is a 62 y.o. female presenting to clinic today for follow up of stage II rectosigmoid cancer. She was last seen by me on 02/01/23.  Since her last visit, she underwent surveillance CT A/P on 05/03/23 showing: no evidence of local recurrence; nonspecific mildly enlarged right common iliac lymph node, again favored reactive; no new pathologically enlarged or enlarging lymph nodes.  Today, she states that she is doing well overall. Her appetite level is at 100%. Her energy level is at 70%.  PAST MEDICAL HISTORY:   Past Medical History: Past Medical History:  Diagnosis Date   Anemia    Arthritis    Asthma    Cancer of sigmoid (HCC)    COPD (chronic obstructive pulmonary disease) (HCC)    Depression    Diabetes mellitus    Diastolic dysfunction 05/15/2015   Dyspnea    Generalized weakness    Hyperlipidemia    Hypertension    Hypothyroidism    Obesity    Palpitations    Pneumonia    PONV (postoperative nausea and vomiting)    Prolonged QT interval 05/14/2015   Sleep apnea     Surgical History: Past Surgical History:  Procedure Laterality Date   BIOPSY  04/06/2022   Procedure: BIOPSY;  Surgeon: Dolores Frame, MD;  [facility];  Service: Gastroenterology;;   CATARACT EXTRACTION     CESAREAN SECTION     CHOLECYSTECTOMY     COLONOSCOPY WITH PROPOFOL N/A 04/06/2022   Procedure: COLONOSCOPY WITH PROPOFOL;  Surgeon: Dolores Frame, MD;  [facility]: [facility];  Service: Gastroenterology;  Laterality: N/A;  805 ASA 2 patient in [facility]   ESOPHAGOGASTRODUODENOSCOPY (EGD) WITH PROPOFOL N/A  04/06/2022   Procedure: ESOPHAGOGASTRODUODENOSCOPY (EGD) WITH PROPOFOL;  Surgeon: Dolores Frame, MD;  [facility];  Service: Gastroenterology;  Laterality: N/A;   HEMOSTASIS CLIP PLACEMENT  04/06/2022   Procedure: HEMOSTASIS CLIP PLACEMENT;  Surgeon: Dolores Frame, MD;  [facility]: [facility];  Service: Gastroenterology;;   POLYPECTOMY  04/06/2022   Procedure: POLYPECTOMY;  Surgeon: Marguerita Merles, Reuel Boom, MD;  Location: [facility];  Service: Gastroenterology;;   SUBMUCOSAL TATTOO INJECTION  04/06/2022   Procedure: SUBMUCOSAL TATTOO INJECTION;  Surgeon: Marguerita Merles, Reuel Boom, MD;  [facility];  Service: Gastroenterology;;    Social History: Social History   Socioeconomic History   Marital status: Married    Spouse name: Not on file   Number of children: Not on file   Years of education: Not on file   Highest education level: Not on file  Occupational History   Not on file  Tobacco Use   Smoking status: Never    Passive exposure: Yes   Smokeless tobacco: Never  Vaping Use   Vaping Use: Never used  Substance and Sexual Activity  Alcohol use: No    Alcohol/week: 0.0 standard drinks of alcohol   Drug use: No   Sexual activity: Yes    Birth control/protection: None  Other Topics Concern   Not on file  Social History Narrative   Lives with husband.  One living son.  Daughter died after transplant age 56.     Are you right handed or left handed? Right   Are you currently employed ? 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    What type of home do you live in: 1 story or 2 story? one       Social Determinants of Health   Financial Resource Strain: Not on file  Food Insecurity: Patient Declined (08/12/2022)   Hunger Vital Sign    Worried About Running Out of Food in the Last Year: Patient declined    Ran Out of Food in the Last Year: Patient declined  Transportation Needs:  No Transportation Needs (08/12/2022)   PRAPARE - Administrator, Civil Service (Medical): No    Lack of Transportation (Non-Medical): No  Physical Activity: Not on file  Stress: Not on file  Social Connections: Not on file  Intimate Partner Violence: Unknown (08/12/2022)   Humiliation, Afraid, Rape, and Kick questionnaire    Fear of Current or Ex-Partner: No    Emotionally Abused: No    Physically Abused: Not on file    Sexually Abused: Not on file    Family History: Family History  Problem Relation Age of Onset   Stroke Mother    Diabetes Father    Heart failure Father    Hypertension Father    Diabetes Sister    Heart failure Sister    Hypertension Sister    Stroke Sister    Cancer Other    Stroke Sister     Current Medications:  Current Outpatient Medications:    ADMELOG 100 UNIT/ML injection, Inject 4-8 Units into the skin 3 (three) times daily with meals. If blood glucose is 200-250= 4 units, 250-300= 5 units, 301-350= 6 units, 351-400= 8 units, > 400 notify MD, Disp: , Rfl:    albuterol (VENTOLIN HFA) 108 (90 Base) MCG/ACT inhaler, Inhale 2 puffs into the lungs every 6 (six) hours as needed for wheezing or shortness of breath., Disp: , Rfl:    alum & mag hydroxide-simeth (MAALOX/MYLANTA) 200-200-20 MG/5ML suspension, Take 30 mLs by mouth every 2 (two) hours as needed for indigestion or heartburn. Do not exceed 4 doses in 24 hours, Disp: , Rfl:    amLODipine (NORVASC) 5 MG tablet, Take 5 mg by mouth daily., Disp: , Rfl:    ascorbic acid (VITAMIN C) 500 MG tablet, Take 500 mg by mouth daily., Disp: , Rfl:    aspirin EC 81 MG tablet, Take 81 mg by mouth daily. Swallow whole., Disp: , Rfl:    atorvastatin (LIPITOR) 10 MG tablet, Take 10 mg by mouth at bedtime., Disp: , Rfl:    BD POSIFLUSH 0.9 % SOLN injection, , Disp: , Rfl:    Cholecalciferol (VITAMIN D) 50 MCG (2000 UT) CAPS, Take 2,000 Units by mouth daily., Disp: , Rfl:    clotrimazole-betamethasone (LOTRISONE)  cream, Apply topically., Disp: , Rfl:    Cranberry 600 MG TABS, Take 300 mg by mouth 2 (two) times daily. Half tab bid, Disp: , Rfl:    doxycycline (VIBRAMYCIN) 100 MG capsule, Take 1 capsule (100 mg total)  by mouth every 12 (twelve) hours., Disp: 14 capsule, Rfl: 0   Emollient (EUCERIN) lotion, Apply 1 Application topically every 12 (twelve) hours as needed for dry skin., Disp: , Rfl:    ferrous sulfate 325 (65 FE) MG tablet, Take 325 mg by mouth daily., Disp: , Rfl:    fosfomycin (MONUROL) 3 g PACK, Take 3 g by mouth every 3 (three) days., Disp: , Rfl:    furosemide (LASIX) 20 MG tablet, Take 20 mg by mouth daily as needed for edema., Disp: , Rfl:    gabapentin (NEURONTIN) 300 MG capsule, Take 1 capsule in AM, 2 capsules in PM, Disp: 90 capsule, Rfl: 11   Glucerna (GLUCERNA) LIQD, Take 237 mLs by mouth daily. For wound healing, Disp: , Rfl:    hydrALAZINE (APRESOLINE) 25 MG tablet, Take 1 tablet (25 mg total) by mouth every 8 (eight) hours., Disp: , Rfl:    hydrOXYzine (ATARAX) 25 MG tablet, , Disp: , Rfl:    insulin glargine-yfgn (SEMGLEE) 100 UNIT/ML injection, Inject 0.28 mLs (28 Units total) into the skin daily., Disp: , Rfl:    ipratropium-albuterol (DUONEB) 0.5-2.5 (3) MG/3ML SOLN, , Disp: , Rfl:    lanolin ointment, Apply topically as needed for dry skin., Disp: , Rfl:    lansoprazole (PREVACID) 30 MG capsule, , Disp: , Rfl:    LANTUS 100 UNIT/ML injection, Inject 30 Units into the skin at bedtime., Disp: , Rfl:    levothyroxine (SYNTHROID) 88 MCG tablet, Take 88 mcg by mouth daily before breakfast., Disp: , Rfl:    loperamide (IMODIUM A-D) 2 MG tablet, Take 2 mg by mouth as needed for diarrhea or loose stools. Do not exceed 4 per day, Disp: , Rfl:    loratadine (CLARITIN) 10 MG tablet, Take 10 mg by mouth daily., Disp: , Rfl:    magnesium oxide (MAG-OX) 400 (240 Mg) MG tablet, Take 400 mg by mouth daily., Disp: , Rfl:    Menthol-Zinc Oxide (CALMOSEPTINE) 0.44-20.6 % OINT, Apply 1  Application topically as needed (preservation/protection after incontinent care)., Disp: , Rfl:    MYRBETRIQ 50 MG TB24 tablet, , Disp: , Rfl:    NOVOLOG 100 UNIT/ML injection, Inject into the skin., Disp: , Rfl:    Nystatin (GERHARDT'S BUTT CREAM) CREA, As directed, Disp: , Rfl:    nystatin (MYCOSTATIN/NYSTOP) powder, Apply topically 2 (two) times daily., Disp: 15 g, Rfl: 0   omeprazole (PRILOSEC) 20 MG capsule, Take 20 mg by mouth in the morning and at bedtime., Disp: , Rfl:    ondansetron (ZOFRAN) 4 MG tablet, Take 8 mg by mouth 2 (two) times daily., Disp: , Rfl:    potassium chloride SA (K-DUR) 20 MEQ tablet, Take 20 mEq by mouth daily., Disp: , Rfl:    PROCRIT 4000 UNIT/ML injection, Inject 4,000 Units into the skin every Monday, Wednesday, and Friday. Hold if hemoglobin is > 10, Disp: , Rfl:    propranolol (INDERAL) 80 MG tablet, Take 80 mg by mouth daily., Disp: , Rfl:    saccharomyces boulardii (FLORASTOR) 250 MG capsule, Take 250 mg by mouth 2 (two) times daily., Disp: , Rfl:    SANTYL 250 UNIT/GM ointment, Apply 1 Application topically daily., Disp: , Rfl:    sertraline (ZOLOFT) 100 MG tablet, , Disp: , Rfl:    Allergies: Allergies  Allergen Reactions   Carvedilol Palpitations   Benicar [Olmesartan] Swelling   Codeine Other (See Comments)    Confusion    Sulfa Antibiotics Swelling    Whole  face swells   Trulicity [Dulaglutide] Diarrhea    REVIEW OF SYSTEMS:   Review of Systems  Constitutional:  Negative for chills, fatigue and fever.  HENT:   Negative for lump/mass, mouth sores, nosebleeds, sore throat and trouble swallowing.   Eyes:  Negative for eye problems.  Respiratory:  Positive for cough and wheezing. Negative for shortness of breath.   Cardiovascular:  Negative for chest pain, leg swelling and palpitations.  Gastrointestinal:  Positive for constipation. Negative for abdominal pain, diarrhea, nausea and vomiting.  Genitourinary:  Negative for bladder incontinence,  difficulty urinating, dysuria, frequency, hematuria and nocturia.   Musculoskeletal:  Negative for arthralgias, back pain, flank pain, myalgias and neck pain.  Skin:  Negative for itching and rash.  Neurological:  Negative for dizziness, headaches and numbness.  Hematological:  Does not bruise/bleed easily.  Psychiatric/Behavioral:  Negative for depression, sleep disturbance and suicidal ideas. The patient is not nervous/anxious.   All other systems reviewed and are negative.    VITALS:   Blood pressure (!) 144/64, pulse 60, temperature 98.2 F (36.8 C), temperature source Tympanic, resp. rate 16, weight 285 lb 8 oz (129.5 kg), last menstrual period 08/06/2012, SpO2 94 %.  Wt Readings from Last 3 Encounters:  05/09/23 285 lb 8 oz (129.5 kg)  03/21/23 274 lb 3.2 oz (124.4 kg)  02/01/23 269 lb 11.2 oz (122.3 kg)    Body mass index is 53.94 kg/m.  Performance status (ECOG): 1 - Symptomatic but completely ambulatory  PHYSICAL EXAM:   Physical Exam Vitals and nursing note reviewed. Exam conducted with a chaperone present.  Constitutional:      Appearance: Normal appearance.  Cardiovascular:     Rate and Rhythm: Normal rate and regular rhythm.     Pulses: Normal pulses.     Heart sounds: Normal heart sounds.  Pulmonary:     Effort: Pulmonary effort is normal.     Breath sounds: Normal breath sounds.  Abdominal:     Palpations: Abdomen is soft. There is no hepatomegaly, splenomegaly or mass.     Tenderness: There is no abdominal tenderness.  Musculoskeletal:     Right lower leg: No edema.     Left lower leg: No edema.  Lymphadenopathy:     Cervical: No cervical adenopathy.     Right cervical: No superficial, deep or posterior cervical adenopathy.    Left cervical: No superficial, deep or posterior cervical adenopathy.     Upper Body:     Right upper body: No supraclavicular or axillary adenopathy.     Left upper body: No supraclavicular or axillary adenopathy.  Neurological:      General: No focal deficit present.     Mental Status: She is alert and oriented to person, place, and time.  Psychiatric:        Mood and Affect: Mood normal.        Behavior: Behavior normal.     LABS:      Latest Ref Rng & Units 05/04/2023   12:44 PM 01/25/2023   11:15 AM 12/31/2022    1:32 PM  CBC  WBC 4.0 - 10.5 K/uL 9.2  8.1  15.5   Hemoglobin 12.0 - 15.0 g/dL 54.0  98.1  19.1   Hematocrit 36.0 - 46.0 % 37.7  39.9  35.3   Platelets 150 - 400 K/uL 359  323  233       Latest Ref Rng & Units 05/04/2023   12:44 PM 05/03/2023    2:49 PM 01/25/2023  11:15 AM  CMP  Glucose 70 - 99 mg/dL 161   49   BUN 8 - 23 mg/dL 29   20   Creatinine 0.96 - 1.00 mg/dL 0.45  4.09  8.11   Sodium 135 - 145 mmol/L 137   141   Potassium 3.5 - 5.1 mmol/L 4.3   4.0   Chloride 98 - 111 mmol/L 101   103   CO2 22 - 32 mmol/L 23   31   Calcium 8.9 - 10.3 mg/dL 9.0   9.4   Total Protein 6.5 - 8.1 g/dL 6.6   6.7   Total Bilirubin 0.3 - 1.2 mg/dL 0.5   0.4   Alkaline Phos 38 - 126 U/L 138   112   AST 15 - 41 U/L 16   17   ALT 0 - 44 U/L 16   13      Lab Results  Component Value Date   CEA1 1.9 05/04/2023   /  CEA  Date Value Ref Range Status  05/04/2023 1.9 0.0 - 4.7 ng/mL Final    Comment:    (NOTE)                             Nonsmokers          <3.9                             Smokers             <5.6 [employer] Electrochemiluminescence Immunoassay (ECLIA) Values obtained with different assay methods or kits cannot be used interchangeably.  Results cannot be interpreted as absolute evidence of the presence or absence of malignant disease. Performed At: [facility] [address] [place], [place] [id number] Jolene Schimke MD OZ:[id number]    No results found for: "PSA1" No results found for: [phone]" No results found for: "CAN125"  No results found for: "TOTALPROTELP", "ALBUMINELP", "A1GS", "A2GS", "BETS", "BETA2SER", "GAMS", "MSPIKE", "SPEI" Lab Results   Component Value Date   TIBC 225 (L) 05/04/2023   TIBC 257 01/25/2023   TIBC 257 10/13/2022   FERRITIN 47 05/04/2023   FERRITIN 44 01/25/2023   FERRITIN 18 10/13/2022   IRONPCTSAT 42 (H) 05/04/2023   IRONPCTSAT 27 01/25/2023   IRONPCTSAT 28 10/13/2022   No results found for: "LDH"   STUDIES:   CT Abdomen Pelvis W Contrast  Result Date: 05/03/2023 CLINICAL DATA:  History of colon cancer stage II/III, monitor. * Tracking Code: BO * EXAM: CT ABDOMEN AND PELVIS WITH CONTRAST TECHNIQUE: Multidetector CT imaging of the abdomen and pelvis was performed using the standard protocol following bolus administration of intravenous contrast. RADIATION DOSE REDUCTION: This exam was performed according to the departmental dose-optimization program which includes automated exposure control, adjustment of the mA and/or kV according to patient size and/or use of iterative reconstruction technique. CONTRAST:  OMNIPAQUE IOHEXOL 300 MG/ML  SOLN COMPARISON:  CT October 13, 2022 FINDINGS: Lower chest: No acute abnormality. Small hiatal hernia with similar mild esophageal wall thickening. Hepatobiliary: Ill-defined hypodensity along the falciform ligament for instance on image 22/2 is slightly more pronounced than on prior examination, typically reflecting focal fatty infiltration. Gallbladder surgically absent. No biliary ductal dilation. Pancreas: No pancreatic ductal dilation or evidence of acute inflammation. Spleen: No splenomegaly. Adrenals/Urinary Tract: Bilateral adrenal glands appear normal. No hydronephrosis. Stable 2.2 cm exophytic right lower pole renal cyst which  is considered benign and requiring no independent imaging follow-up. Urinary bladder is unremarkable for degree of distension. Stomach/Bowel: Radiopaque enteric contrast material traverses the ascending colon. Stable surgical clips in the anterior body of the stomach. No pathologic dilation of small or large bowel. No evidence of acute bowel  inflammation. Prior partial sigmoidectomy with rectosigmoid anastomotic sutures in the midline pelvis on image 65/2 common no suspicious soft tissue nodularity along the suture line. Vascular/Lymphatic: Normal caliber abdominal aorta. Smooth IVC contours. Mildly enlarged right common iliac lymph node measures 9 mm on image 59/2 previously 10 mm. No new pathologically enlarged or enlarging lymph nodes in the abdomen or pelvis. Reproductive: Uterus and bilateral adnexa are unremarkable. Other: No significant abdominopelvic free fluid. No discrete peritoneal or omental nodularity. Increased cutaneous thickening and subcutaneous edema along the left flank. Musculoskeletal: No aggressive lytic or blastic lesion of bone. IMPRESSION: 1. Prior partial sigmoidectomy with rectosigmoid anastomotic sutures in the midline pelvis. No evidence of local recurrence. 2. Mildly enlarged right common iliac lymph node measures 9 mm common nonspecific but again favored reactive. No new pathologically enlarged or enlarging lymph nodes in the abdomen or pelvis. 3. Ill-defined hypodensity along the falciform ligament is slightly more pronounced than on prior examination, typically reflecting focal fatty infiltration. Attention on follow-up versus more definitive characterization by hepatic protocol pre and postcontrast enhanced abdominal MRI. 4. Increased cutaneous thickening and subcutaneous edema along the left flank, nonspecific, correlate with physical examination. 5. Stable mild distal esophageal wall thickening with small hiatal hernia, nonspecific but commonly reflecting reflux esophagitis. Electronically Signed   By: Maudry Mayhew M.D.   On: 05/03/2023 16:55

## 2023-05-09 NOTE — Patient Instructions (Addendum)
[facility] at [facility] Discharge Instructions   You were seen and examined today by Dr. Ellin Saba.  He reviewed the results of your lab work which is normal. Your CEA is normal at 2.4.  He reviewed the results of your CT scan which is normal. There was one enlarged lymph node noted, but it is favored to be reactive (not cancer).   We will see you back in 3 months. We will repeat lab work prior to your next visit.    Thank you for choosing [facility] at [facility] to provide your oncology and hematology care.  To afford each patient quality time with our provider, please arrive at least 15 minutes before your scheduled appointment time.   If you have a lab appointment with the Cancer Center please come in thru the Main Entrance and check in at the main information desk.  You need to re-schedule your appointment should you arrive 10 or more minutes late.  We strive to give you quality time with our providers, and arriving late affects you and other patients whose appointments are after yours.  Also, if you no show three or more times for appointments you may be dismissed from the clinic at the providers discretion.     Again, thank you for choosing [facility].  Our hope is that these requests will decrease the amount of time that you wait before being seen by our physicians.       _____________________________________________________________  Should you have questions after your visit to [facility], please contact our office at [phone] and follow the prompts.  Our office hours are 8:00 a.m. and 4:30 p.m. Monday - Friday.  Please note that voicemails left after 4:00 p.m. may not be returned until the following business day.  We are closed weekends and major holidays.  You do have access to a nurse 24-7, just call the main number to the clinic [phone] and do not press any options, hold on the line and a nurse  will answer the phone.    For prescription refill requests, have your pharmacy contact our office and allow 72 hours.    Due to Covid, you will need to wear a mask upon entering the hospital. If you do not have a mask, a mask will be given to you at the Main Entrance upon arrival. For doctor visits, patients may have 1 support person age 68 or older with them. For treatment visits, patients can not have anyone with them due to social distancing guidelines and our immunocompromised population.

## 2023-05-12 ENCOUNTER — Other Ambulatory Visit (HOSPITAL_COMMUNITY): Payer: Self-pay | Admitting: Nephrology

## 2023-05-12 DIAGNOSIS — N1832 Chronic kidney disease, stage 3b: Secondary | ICD-10-CM

## 2023-05-27 DIAGNOSIS — G5601 Carpal tunnel syndrome, right upper limb: Secondary | ICD-10-CM | POA: Insufficient documentation

## 2023-05-27 DIAGNOSIS — G5602 Carpal tunnel syndrome, left upper limb: Secondary | ICD-10-CM | POA: Insufficient documentation

## 2023-06-07 ENCOUNTER — Ambulatory Visit (HOSPITAL_COMMUNITY)
Admission: RE | Admit: 2023-06-07 | Discharge: 2023-06-07 | Disposition: A | Discharge status: Home / Self Care | Payer: Medicare HMO | Source: Ambulatory Visit | Attending: Nephrology | Admitting: Nephrology

## 2023-06-07 DIAGNOSIS — N1832 Chronic kidney disease, stage 3b: Secondary | ICD-10-CM | POA: Diagnosis present

## 2023-06-27 ENCOUNTER — Other Ambulatory Visit: Payer: Self-pay

## 2023-06-27 ENCOUNTER — Inpatient Hospital Stay (HOSPITAL_COMMUNITY)
Admission: EM | Admit: 2023-06-27 | Discharge: 2023-07-02 | DRG: 193 | Disposition: A | Discharge status: Skilled Nursing Facility | Payer: Medicare HMO | Source: Skilled Nursing Facility | Attending: Family Medicine | Admitting: Family Medicine

## 2023-06-27 ENCOUNTER — Emergency Department (HOSPITAL_COMMUNITY): Payer: Medicare HMO

## 2023-06-27 ENCOUNTER — Inpatient Hospital Stay (HOSPITAL_COMMUNITY): Payer: Medicare HMO

## 2023-06-27 DIAGNOSIS — J440 Chronic obstructive pulmonary disease with acute lower respiratory infection: Secondary | ICD-10-CM | POA: Diagnosis present

## 2023-06-27 DIAGNOSIS — E1122 Type 2 diabetes mellitus with diabetic chronic kidney disease: Secondary | ICD-10-CM | POA: Diagnosis present

## 2023-06-27 DIAGNOSIS — Z6841 Body Mass Index (BMI) 40.0 and over, adult: Secondary | ICD-10-CM

## 2023-06-27 DIAGNOSIS — N1832 Chronic kidney disease, stage 3b: Secondary | ICD-10-CM | POA: Diagnosis present

## 2023-06-27 DIAGNOSIS — E876 Hypokalemia: Secondary | ICD-10-CM | POA: Diagnosis present

## 2023-06-27 DIAGNOSIS — Z794 Long term (current) use of insulin: Secondary | ICD-10-CM | POA: Diagnosis not present

## 2023-06-27 DIAGNOSIS — E662 Morbid (severe) obesity with alveolar hypoventilation: Secondary | ICD-10-CM | POA: Diagnosis present

## 2023-06-27 DIAGNOSIS — I5033 Acute on chronic diastolic (congestive) heart failure: Secondary | ICD-10-CM | POA: Diagnosis present

## 2023-06-27 DIAGNOSIS — Z79890 Hormone replacement therapy (postmenopausal): Secondary | ICD-10-CM

## 2023-06-27 DIAGNOSIS — J441 Chronic obstructive pulmonary disease with (acute) exacerbation: Secondary | ICD-10-CM | POA: Diagnosis present

## 2023-06-27 DIAGNOSIS — E782 Mixed hyperlipidemia: Secondary | ICD-10-CM | POA: Diagnosis present

## 2023-06-27 DIAGNOSIS — K219 Gastro-esophageal reflux disease without esophagitis: Secondary | ICD-10-CM | POA: Diagnosis present

## 2023-06-27 DIAGNOSIS — E1121 Type 2 diabetes mellitus with diabetic nephropathy: Secondary | ICD-10-CM | POA: Diagnosis present

## 2023-06-27 DIAGNOSIS — E119 Type 2 diabetes mellitus without complications: Secondary | ICD-10-CM

## 2023-06-27 DIAGNOSIS — Z85038 Personal history of other malignant neoplasm of large intestine: Secondary | ICD-10-CM

## 2023-06-27 DIAGNOSIS — Z881 Allergy status to other antibiotic agents status: Secondary | ICD-10-CM

## 2023-06-27 DIAGNOSIS — R7881 Bacteremia: Secondary | ICD-10-CM | POA: Diagnosis present

## 2023-06-27 DIAGNOSIS — R9431 Abnormal electrocardiogram [ECG] [EKG]: Secondary | ICD-10-CM | POA: Diagnosis present

## 2023-06-27 DIAGNOSIS — Z66 Do not resuscitate: Secondary | ICD-10-CM | POA: Diagnosis present

## 2023-06-27 DIAGNOSIS — E039 Hypothyroidism, unspecified: Secondary | ICD-10-CM | POA: Diagnosis present

## 2023-06-27 DIAGNOSIS — J449 Chronic obstructive pulmonary disease, unspecified: Secondary | ICD-10-CM | POA: Diagnosis present

## 2023-06-27 DIAGNOSIS — B963 Hemophilus influenzae [H. influenzae] as the cause of diseases classified elsewhere: Secondary | ICD-10-CM | POA: Diagnosis present

## 2023-06-27 DIAGNOSIS — Z823 Family history of stroke: Secondary | ICD-10-CM

## 2023-06-27 DIAGNOSIS — G4733 Obstructive sleep apnea (adult) (pediatric): Secondary | ICD-10-CM | POA: Diagnosis present

## 2023-06-27 DIAGNOSIS — Z8249 Family history of ischemic heart disease and other diseases of the circulatory system: Secondary | ICD-10-CM | POA: Diagnosis not present

## 2023-06-27 DIAGNOSIS — J189 Pneumonia, unspecified organism: Secondary | ICD-10-CM | POA: Diagnosis not present

## 2023-06-27 DIAGNOSIS — J9621 Acute and chronic respiratory failure with hypoxia: Secondary | ICD-10-CM | POA: Diagnosis present

## 2023-06-27 DIAGNOSIS — Z79899 Other long term (current) drug therapy: Secondary | ICD-10-CM | POA: Diagnosis not present

## 2023-06-27 DIAGNOSIS — J9601 Acute respiratory failure with hypoxia: Secondary | ICD-10-CM | POA: Diagnosis not present

## 2023-06-27 DIAGNOSIS — N179 Acute kidney failure, unspecified: Secondary | ICD-10-CM | POA: Diagnosis present

## 2023-06-27 DIAGNOSIS — T380X5A Adverse effect of glucocorticoids and synthetic analogues, initial encounter: Secondary | ICD-10-CM | POA: Diagnosis present

## 2023-06-27 DIAGNOSIS — I130 Hypertensive heart and chronic kidney disease with heart failure and stage 1 through stage 4 chronic kidney disease, or unspecified chronic kidney disease: Secondary | ICD-10-CM | POA: Diagnosis present

## 2023-06-27 DIAGNOSIS — F32A Depression, unspecified: Secondary | ICD-10-CM | POA: Diagnosis present

## 2023-06-27 DIAGNOSIS — Z882 Allergy status to sulfonamides status: Secondary | ICD-10-CM

## 2023-06-27 DIAGNOSIS — Z1152 Encounter for screening for COVID-19: Secondary | ICD-10-CM | POA: Diagnosis not present

## 2023-06-27 DIAGNOSIS — R509 Fever, unspecified: Secondary | ICD-10-CM | POA: Diagnosis not present

## 2023-06-27 DIAGNOSIS — Z888 Allergy status to other drugs, medicaments and biological substances status: Secondary | ICD-10-CM

## 2023-06-27 DIAGNOSIS — N189 Chronic kidney disease, unspecified: Secondary | ICD-10-CM | POA: Diagnosis present

## 2023-06-27 DIAGNOSIS — I10 Essential (primary) hypertension: Secondary | ICD-10-CM | POA: Diagnosis present

## 2023-06-27 DIAGNOSIS — Z885 Allergy status to narcotic agent status: Secondary | ICD-10-CM

## 2023-06-27 DIAGNOSIS — Z833 Family history of diabetes mellitus: Secondary | ICD-10-CM

## 2023-06-27 DIAGNOSIS — Z7982 Long term (current) use of aspirin: Secondary | ICD-10-CM

## 2023-06-27 LAB — URINALYSIS, ROUTINE W REFLEX MICROSCOPIC
Bilirubin Urine: NEGATIVE
Glucose, UA: 50 mg/dL — AB
Hgb urine dipstick: NEGATIVE
Ketones, ur: 5 mg/dL — AB
Nitrite: NEGATIVE
Protein, ur: >=300 mg/dL — AB
Specific Gravity, Urine: 1.011 (ref 1.005–1.030)
WBC, UA: >50 WBC/hpf (ref 0–5)
pH: 7 (ref 5.0–8.0)

## 2023-06-27 LAB — GLUCOSE, CAPILLARY
Glucose-Capillary: 297 mg/dL — ABNORMAL HIGH (ref 70–99)
Glucose-Capillary: 292 mg/dL — ABNORMAL HIGH (ref 70–99)
Glucose-Capillary: 277 mg/dL — ABNORMAL HIGH (ref 70–99)
Glucose-Capillary: 241 mg/dL — ABNORMAL HIGH (ref 70–99)

## 2023-06-27 LAB — ECHOCARDIOGRAM COMPLETE
Area-P 1/2: 4.74 cm2
Calc EF: 66.3 %
Height: 61 in
S' Lateral: 2.8 cm
Single Plane A2C EF: 72.5 %
Single Plane A4C EF: 59.6 %
Weight: 4582.04 oz

## 2023-06-27 LAB — SARS CORONAVIRUS 2 BY RT PCR: SARS Coronavirus 2 by RT PCR: NEGATIVE

## 2023-06-27 LAB — COMPREHENSIVE METABOLIC PANEL
ALT: 35 U/L (ref 0–44)
AST: 29 U/L (ref 15–41)
Albumin: 3 g/dL — ABNORMAL LOW (ref 3.5–5.0)
Alkaline Phosphatase: 149 U/L — ABNORMAL HIGH (ref 38–126)
Anion gap: 13 (ref 5–15)
BUN: 44 mg/dL — ABNORMAL HIGH (ref 8–23)
CO2: 26 mmol/L (ref 22–32)
Calcium: 8.9 mg/dL (ref 8.9–10.3)
Chloride: 95 mmol/L — ABNORMAL LOW (ref 98–111)
Creatinine, Ser: 2.03 mg/dL — ABNORMAL HIGH (ref 0.44–1.00)
GFR, Estimated: 27 mL/min — ABNORMAL LOW (ref >60)
Glucose, Bld: 291 mg/dL — ABNORMAL HIGH (ref 70–99)
Potassium: 4.1 mmol/L (ref 3.5–5.1)
Sodium: 134 mmol/L — ABNORMAL LOW (ref 135–145)
Total Bilirubin: 2.1 mg/dL — ABNORMAL HIGH (ref 0.3–1.2)
Total Protein: 7.8 g/dL (ref 6.5–8.1)

## 2023-06-27 LAB — BLOOD GAS, ARTERIAL
Acid-Base Excess: 0.4 mmol/L (ref 0.0–2.0)
Acid-base deficit: 2.3 mmol/L — ABNORMAL HIGH (ref 0.0–2.0)
Bicarbonate: 27.7 mmol/L (ref 20.0–28.0)
Bicarbonate: 25 mmol/L (ref 20.0–28.0)
Drawn by: 22766
Drawn by: 41977
O2 Saturation: 97.2 %
O2 Saturation: 94.1 %
Patient temperature: 38.2
Patient temperature: 37
pCO2 arterial: 58 mmHg — ABNORMAL HIGH (ref 32–48)
pCO2 arterial: 52 mmHg — ABNORMAL HIGH (ref 32–48)
pH, Arterial: 7.29 — ABNORMAL LOW (ref 7.35–7.45)
pH, Arterial: 7.29 — ABNORMAL LOW (ref 7.35–7.45)
pO2, Arterial: 118 mmHg — ABNORMAL HIGH (ref 83–108)
pO2, Arterial: 69 mmHg — ABNORMAL LOW (ref 83–108)

## 2023-06-27 LAB — HEMOGLOBIN A1C
Hgb A1c MFr Bld: 8.5 % — ABNORMAL HIGH (ref 4.8–5.6)
Mean Plasma Glucose: 197.25 mg/dL

## 2023-06-27 LAB — CBC WITH DIFFERENTIAL/PLATELET
Abs Immature Granulocytes: 0.11 10*3/uL — ABNORMAL HIGH (ref 0.00–0.07)
Basophils Absolute: 0.1 10*3/uL (ref 0.0–0.1)
Basophils Relative: 0 %
Eosinophils Absolute: 0 10*3/uL (ref 0.0–0.5)
Eosinophils Relative: 0 %
HCT: 43.7 % (ref 36.0–46.0)
Hemoglobin: 14 g/dL (ref 12.0–15.0)
Immature Granulocytes: 1 %
Lymphocytes Relative: 3 %
Lymphs Abs: 0.5 10*3/uL — ABNORMAL LOW (ref 0.7–4.0)
MCH: 28 pg (ref 26.0–34.0)
MCHC: 32 g/dL (ref 30.0–36.0)
MCV: 87.4 fL (ref 80.0–100.0)
Monocytes Absolute: 0.8 10*3/uL (ref 0.1–1.0)
Monocytes Relative: 5 %
Neutro Abs: 15.8 10*3/uL — ABNORMAL HIGH (ref 1.7–7.7)
Neutrophils Relative %: 91 %
Platelets: 277 10*3/uL (ref 150–400)
RBC: 5 MIL/uL (ref 3.87–5.11)
RDW: 14.6 % (ref 11.5–15.5)
WBC: 17.3 10*3/uL — ABNORMAL HIGH (ref 4.0–10.5)
nRBC: 0.1 % (ref 0.0–0.2)

## 2023-06-27 LAB — LACTIC ACID, PLASMA
Lactic Acid, Venous: 1.5 mmol/L (ref 0.5–1.9)
Lactic Acid, Venous: 1.7 mmol/L (ref 0.5–1.9)
Lactic Acid, Venous: 1.7 mmol/L (ref 0.5–1.9)

## 2023-06-27 LAB — PHOSPHORUS: Phosphorus: 3.7 mg/dL (ref 2.5–4.6)

## 2023-06-27 LAB — TROPONIN I (HIGH SENSITIVITY)
Troponin I (High Sensitivity): 41 ng/L — ABNORMAL HIGH (ref <18)
Troponin I (High Sensitivity): 42 ng/L — ABNORMAL HIGH (ref <18)
Troponin I (High Sensitivity): 43 ng/L — ABNORMAL HIGH (ref <18)

## 2023-06-27 LAB — CBG MONITORING, ED: Glucose-Capillary: 286 mg/dL — ABNORMAL HIGH (ref 70–99)

## 2023-06-27 LAB — STREP PNEUMONIAE URINARY ANTIGEN: Strep Pneumo Urinary Antigen: NEGATIVE

## 2023-06-27 LAB — MAGNESIUM: Magnesium: 1.8 mg/dL (ref 1.7–2.4)

## 2023-06-27 LAB — TSH: TSH: 2.091 u[IU]/mL (ref 0.350–4.500)

## 2023-06-27 LAB — BRAIN NATRIURETIC PEPTIDE: B Natriuretic Peptide: 1009 pg/mL — ABNORMAL HIGH (ref 0.0–100.0)

## 2023-06-27 LAB — MRSA NEXT GEN BY PCR, NASAL: MRSA by PCR Next Gen: DETECTED — AB

## 2023-06-27 MED ORDER — GABAPENTIN 300 MG PO CAPS
300.0000 mg | ORAL_CAPSULE | Freq: Every day | ORAL | Status: DC
  Administered 2023-06-28 – 2023-06-29 (×2): 300 mg via ORAL
  Filled 2023-06-27 (×2): qty 1

## 2023-06-27 MED ORDER — MUPIROCIN 2 % EX OINT
1.0000 | TOPICAL_OINTMENT | Freq: Two times a day (BID) | CUTANEOUS | Status: AC
  Administered 2023-06-27 – 2023-07-02 (×10): 1 via NASAL
  Filled 2023-06-27: qty 22

## 2023-06-27 MED ORDER — ACETAMINOPHEN 325 MG PO TABS: 650.0000 mg | ORAL_TABLET | Freq: Four times a day (QID) | ORAL | Status: DC | PRN

## 2023-06-27 MED ORDER — INSULIN GLARGINE-YFGN 100 UNIT/ML ~~LOC~~ SOLN
12.0000 [IU] | Freq: Every day | SUBCUTANEOUS | Status: DC
  Administered 2023-06-27 – 2023-06-28 (×2): 12 [IU] via SUBCUTANEOUS
  Filled 2023-06-27 (×3): qty 0.12

## 2023-06-27 MED ORDER — HEPARIN SODIUM (PORCINE) 5000 UNIT/ML IJ SOLN
5000.0000 [IU] | Freq: Three times a day (TID) | INTRAMUSCULAR | Status: DC
  Administered 2023-06-27 – 2023-07-02 (×15): 5000 [IU] via SUBCUTANEOUS
  Filled 2023-06-27 (×15): qty 1

## 2023-06-27 MED ORDER — VITAMIN C 500 MG PO TABS
500.0000 mg | ORAL_TABLET | Freq: Every day | ORAL | Status: DC
  Administered 2023-06-28 – 2023-07-02 (×5): 500 mg via ORAL
  Filled 2023-06-27 (×5): qty 1

## 2023-06-27 MED ORDER — PERFLUTREN LIPID MICROSPHERE
1.0000 mL | INTRAVENOUS | Status: AC | PRN
  Administered 2023-06-27: 2 mL via INTRAVENOUS

## 2023-06-27 MED ORDER — ACETAMINOPHEN 650 MG RE SUPP: 650.0000 mg | Freq: Four times a day (QID) | RECTAL | Status: DC | PRN

## 2023-06-27 MED ORDER — SODIUM CHLORIDE 0.9 % IV SOLN: 250.0000 mL | INTRAVENOUS | Status: DC | PRN

## 2023-06-27 MED ORDER — SERTRALINE HCL 50 MG PO TABS
100.0000 mg | ORAL_TABLET | Freq: Every day | ORAL | Status: DC
  Administered 2023-06-28: 100 mg via ORAL
  Filled 2023-06-27 (×2): qty 2

## 2023-06-27 MED ORDER — LEVOFLOXACIN IN D5W 500 MG/100ML IV SOLN
500.0000 mg | INTRAVENOUS | Status: DC
  Administered 2023-06-27: 500 mg via INTRAVENOUS
  Filled 2023-06-27: qty 100

## 2023-06-27 MED ORDER — FUROSEMIDE 40 MG PO TABS: 40.0000 mg | ORAL_TABLET | Freq: Two times a day (BID) | ORAL | Status: DC

## 2023-06-27 MED ORDER — SODIUM CHLORIDE 0.9% FLUSH
3.0000 mL | Freq: Two times a day (BID) | INTRAVENOUS | Status: DC
  Administered 2023-06-27 – 2023-07-02 (×10): 3 mL via INTRAVENOUS

## 2023-06-27 MED ORDER — CHLORHEXIDINE GLUCONATE CLOTH 2 % EX PADS
6.0000 | MEDICATED_PAD | Freq: Every day | CUTANEOUS | Status: AC
  Administered 2023-06-28 – 2023-07-02 (×5): 6 via TOPICAL

## 2023-06-27 MED ORDER — IPRATROPIUM-ALBUTEROL 0.5-2.5 (3) MG/3ML IN SOLN
3.0000 mL | Freq: Four times a day (QID) | RESPIRATORY_TRACT | Status: DC
  Administered 2023-06-27 – 2023-07-02 (×20): 3 mL via RESPIRATORY_TRACT
  Filled 2023-06-27 (×21): qty 3

## 2023-06-27 MED ORDER — LEVOTHYROXINE SODIUM 88 MCG PO TABS
88.0000 ug | ORAL_TABLET | Freq: Every day | ORAL | Status: DC
  Administered 2023-06-28 – 2023-07-02 (×5): 88 ug via ORAL
  Filled 2023-06-27 (×5): qty 1

## 2023-06-27 MED ORDER — SACCHAROMYCES BOULARDII 250 MG PO CAPS
250.0000 mg | ORAL_CAPSULE | Freq: Two times a day (BID) | ORAL | Status: DC
  Administered 2023-06-28 – 2023-07-02 (×9): 250 mg via ORAL
  Filled 2023-06-27 (×10): qty 1

## 2023-06-27 MED ORDER — INSULIN ASPART 100 UNIT/ML IJ SOLN
0.0000 [IU] | Freq: Three times a day (TID) | INTRAMUSCULAR | Status: DC
  Administered 2023-06-27: 8 [IU] via SUBCUTANEOUS
  Administered 2023-06-28: 15 [IU] via SUBCUTANEOUS
  Administered 2023-06-28 (×2): 11 [IU] via SUBCUTANEOUS

## 2023-06-27 MED ORDER — GABAPENTIN 100 MG PO CAPS: 100.0000 mg | ORAL_CAPSULE | Freq: Every day | ORAL | Status: DC

## 2023-06-27 MED ORDER — NYSTATIN 100000 UNIT/GM EX POWD: Freq: Two times a day (BID) | CUTANEOUS | Status: DC | PRN

## 2023-06-27 MED ORDER — METHYLPREDNISOLONE SODIUM SUCC 40 MG IJ SOLR
40.0000 mg | Freq: Two times a day (BID) | INTRAMUSCULAR | Status: DC
  Administered 2023-06-27 – 2023-06-30 (×7): 40 mg via INTRAVENOUS
  Filled 2023-06-27 (×7): qty 1

## 2023-06-27 MED ORDER — ASPIRIN 81 MG PO TBEC
81.0000 mg | DELAYED_RELEASE_TABLET | Freq: Every day | ORAL | Status: DC
  Administered 2023-06-28 – 2023-07-02 (×5): 81 mg via ORAL
  Filled 2023-06-27 (×5): qty 1

## 2023-06-27 MED ORDER — PANTOPRAZOLE SODIUM 40 MG PO TBEC
40.0000 mg | DELAYED_RELEASE_TABLET | Freq: Every day | ORAL | Status: DC
  Administered 2023-06-28 – 2023-07-02 (×5): 40 mg via ORAL
  Filled 2023-06-27 (×5): qty 1

## 2023-06-27 MED ORDER — GABAPENTIN 300 MG PO CAPS
600.0000 mg | ORAL_CAPSULE | Freq: Every evening | ORAL | Status: DC
  Administered 2023-06-28: 600 mg via ORAL
  Filled 2023-06-27: qty 2

## 2023-06-27 MED ORDER — HYDRALAZINE HCL 25 MG PO TABS
25.0000 mg | ORAL_TABLET | Freq: Three times a day (TID) | ORAL | Status: DC
  Administered 2023-06-28 – 2023-07-02 (×13): 25 mg via ORAL
  Filled 2023-06-27 (×14): qty 1

## 2023-06-27 MED ORDER — FUROSEMIDE 10 MG/ML IJ SOLN
40.0000 mg | Freq: Two times a day (BID) | INTRAMUSCULAR | Status: DC
  Administered 2023-06-27 – 2023-06-28 (×3): 40 mg via INTRAVENOUS
  Filled 2023-06-27 (×3): qty 4

## 2023-06-27 MED ORDER — VANCOMYCIN HCL 10 G IV SOLR: 2500.0000 mg | Freq: Once | INTRAVENOUS | Status: DC

## 2023-06-27 MED ORDER — ATORVASTATIN CALCIUM 10 MG PO TABS
10.0000 mg | ORAL_TABLET | Freq: Every day | ORAL | Status: DC
  Administered 2023-06-28 – 2023-07-01 (×4): 10 mg via ORAL
  Filled 2023-06-27 (×5): qty 1

## 2023-06-27 MED ORDER — PROPRANOLOL HCL 10 MG PO TABS
80.0000 mg | ORAL_TABLET | Freq: Every day | ORAL | Status: DC
  Filled 2023-06-27 (×3): qty 8

## 2023-06-27 MED ORDER — SODIUM CHLORIDE 0.9% FLUSH: 3.0000 mL | INTRAVENOUS | Status: DC | PRN

## 2023-06-27 MED ORDER — VANCOMYCIN HCL 500 MG/100ML IV SOLN
500.0000 mg | Freq: Once | INTRAVENOUS | Status: DC
  Filled 2023-06-27: qty 100

## 2023-06-27 MED ORDER — SODIUM CHLORIDE 0.9 % IV SOLN: INTRAVENOUS | Status: DC

## 2023-06-27 MED ORDER — PROCHLORPERAZINE EDISYLATE 10 MG/2ML IJ SOLN: 10.0000 mg | Freq: Four times a day (QID) | INTRAMUSCULAR | Status: DC | PRN

## 2023-06-27 MED ORDER — BUDESONIDE 0.5 MG/2ML IN SUSP
0.5000 mg | Freq: Two times a day (BID) | RESPIRATORY_TRACT | Status: DC
  Administered 2023-06-27 – 2023-07-02 (×11): 0.5 mg via RESPIRATORY_TRACT
  Filled 2023-06-27 (×11): qty 2

## 2023-06-27 MED ORDER — ARFORMOTEROL TARTRATE 15 MCG/2ML IN NEBU
15.0000 ug | INHALATION_SOLUTION | Freq: Two times a day (BID) | RESPIRATORY_TRACT | Status: DC
  Administered 2023-06-27 – 2023-07-02 (×11): 15 ug via RESPIRATORY_TRACT
  Filled 2023-06-27 (×10): qty 2

## 2023-06-27 MED ORDER — ACETAMINOPHEN 500 MG PO TABS
1000.0000 mg | ORAL_TABLET | Freq: Once | ORAL | Status: AC
  Administered 2023-06-27: 1000 mg via ORAL
  Filled 2023-06-27: qty 2

## 2023-06-27 MED ORDER — AMLODIPINE BESYLATE 5 MG PO TABS
5.0000 mg | ORAL_TABLET | Freq: Every day | ORAL | Status: DC
  Administered 2023-06-28 – 2023-07-02 (×5): 5 mg via ORAL
  Filled 2023-06-27 (×5): qty 1

## 2023-06-27 MED ORDER — INSULIN ASPART 100 UNIT/ML IJ SOLN
0.0000 [IU] | Freq: Every day | INTRAMUSCULAR | Status: DC
  Administered 2023-06-27: 2 [IU] via SUBCUTANEOUS
  Administered 2023-06-28: 3 [IU] via SUBCUTANEOUS
  Administered 2023-06-30 – 2023-07-01 (×2): 4 [IU] via SUBCUTANEOUS

## 2023-06-27 MED ORDER — VANCOMYCIN VARIABLE DOSE PER UNSTABLE RENAL FUNCTION (PHARMACIST DOSING): Status: DC

## 2023-06-27 MED ORDER — VANCOMYCIN HCL 2000 MG/400ML IV SOLN
2000.0000 mg | Freq: Once | INTRAVENOUS | Status: AC
  Administered 2023-06-27: 2000 mg via INTRAVENOUS
  Filled 2023-06-27: qty 400

## 2023-06-27 MED ORDER — MAGNESIUM OXIDE -MG SUPPLEMENT 400 (240 MG) MG PO TABS
400.0000 mg | ORAL_TABLET | Freq: Every day | ORAL | Status: DC
  Administered 2023-06-28 – 2023-07-02 (×5): 400 mg via ORAL
  Filled 2023-06-27 (×6): qty 1

## 2023-06-27 NOTE — Assessment & Plan Note (Signed)
Continue statin. 

## 2023-06-27 NOTE — Assessment & Plan Note (Signed)
-  Pete A1c -Continue sliding scale insulin and the use of Semglee -Follow CBGs fluctuation and adjust hypoglycemic regimen as needed -Anticipate increase in blood sugar levels due to the use of his steroids -Modified carbohydrate diet has been requested.

## 2023-06-27 NOTE — Assessment & Plan Note (Signed)
-  Body mass index is 54.11 kg/m. -Low-calorie diet, portion control and increase physical activity discussed with patient.

## 2023-06-27 NOTE — Assessment & Plan Note (Signed)
-  Continue telemetry monitoring. -replete electrolytes as needed and minimize the use of medications that can further prolong QT.

## 2023-06-27 NOTE — Assessment & Plan Note (Signed)
-  Stable for the most part -Continue current antihypertensive agents -Follow-up vital signs -Heart healthy diet has been ordered.

## 2023-06-27 NOTE — Assessment & Plan Note (Signed)
-  Stable mood/flat affect -Continue home antidepressant management. -No hallucinations and no suicidal ideation currently.

## 2023-06-27 NOTE — Progress Notes (Signed)
  Echocardiogram 2D Echocardiogram has been performed.  [name] 06/27/2023, 4:07 PM

## 2023-06-27 NOTE — Assessment & Plan Note (Signed)
-  Patient creatinine higher than baseline and what appears to be decreased perfusion due to acute on chronic heart failure. -Avoid hypotension, minimize/avoid the use of contrast and closely follow renal function while diuresing patient -Continue strict I's and O's.

## 2023-06-27 NOTE — Progress Notes (Signed)
Transported patient from ED to ICU 8 on BIPAP 100%. Tolerated well. Placed ON 80% in ICU once settled. SAT 95%

## 2023-06-27 NOTE — ED Triage Notes (Signed)
Pt arrived [facility] for respiratory distress from [facility]. Staff stated pt is normally on RA and it was 83% RA. Pt verbalized she has had a cough for 4 or 5 days. Pt arrived on Cpap 94%.

## 2023-06-27 NOTE — ED Provider Notes (Addendum)
[place] EMERGENCY DEPARTMENT AT [facility] Provider Note   CSN: [id number] Arrival date & time: 06/27/23  0920     History  Chief Complaint  Patient presents with   Respiratory Distress    [name] is a 62 y.o. female.  Patient brought in from [facility] nursing facility by EMS.  Respiratory distress.  EMS started CPAP.  Patient's oxygen sats they were in the 80s.  Patient has a history of COPD and CHF but is not on home oxygen.  Chest x-ray yesterday apparently was negative.  We switched her over to BiPAP immediately upon arrival.  Patient is awake and will follow commands and verbalize.  Patient febrile here.  Patient is a full code.       Home Medications Prior to Admission medications   Medication Sig Start Date End Date Taking? Authorizing Provider  ADMELOG 100 UNIT/ML injection Inject 4-8 Units into the skin 3 (three) times daily with meals. If blood glucose is 200-250= 4 units, 250-300= 5 units, 301-350= 6 units, 351-400= 8 units, > 400 notify MD 04/21/22   [provider]  albuterol (VENTOLIN HFA) 108 (90 Base) MCG/ACT inhaler Inhale 2 puffs into the lungs every 6 (six) hours as needed for wheezing or shortness of breath.    [provider]  alum & mag hydroxide-simeth (MAALOX/MYLANTA) 200-200-20 MG/5ML suspension Take 30 mLs by mouth every 2 (two) hours as needed for indigestion or heartburn. Do not exceed 4 doses in 24 hours    [provider]  amLODipine (NORVASC) 5 MG tablet Take 5 mg by mouth daily. 03/30/23   [provider]  ascorbic acid (VITAMIN C) 500 MG tablet Take 500 mg by mouth daily.    [provider]  aspirin EC 81 MG tablet Take 81 mg by mouth daily. Swallow whole.    [provider]  atorvastatin (LIPITOR) 10 MG tablet Take 10 mg by mouth at bedtime. 09/29/21   [provider]  BD POSIFLUSH 0.9 % SOLN injection  03/31/23   [provider]  Cholecalciferol  (VITAMIN D) 50 MCG (2000 UT) CAPS Take 2,000 Units by mouth daily.    [provider]  clotrimazole-betamethasone (LOTRISONE) cream Apply topically. 04/25/23   [provider]  Cranberry 600 MG TABS Take 300 mg by mouth 2 (two) times daily. Half tab bid    [provider]  doxycycline (VIBRAMYCIN) 100 MG capsule Take 1 capsule (100 mg total) by mouth every 12 (twelve) hours. 09/06/22   Summerlin, Regan Rakers, PA-C  Emollient (EUCERIN) lotion Apply 1 Application topically every 12 (twelve) hours as needed for dry skin.    [provider]  ferrous sulfate 325 (65 FE) MG tablet Take 325 mg by mouth daily.    [provider]  fosfomycin (MONUROL) 3 g PACK Take 3 g by mouth every 3 (three) days. 05/06/22   [provider]  furosemide (LASIX) 20 MG tablet Take 20 mg by mouth daily as needed for edema. 03/29/22   [provider]  gabapentin (NEURONTIN) 300 MG capsule Take 1 capsule in AM, 2 capsules in PM 03/21/23   Van Clines, MD  Glucerna [facility]) LIQD Take 237 mLs by mouth daily. For wound healing    [provider]  hydrALAZINE (APRESOLINE) 25 MG tablet Take 1 tablet (25 mg total) by mouth every 8 (eight) hours. 11/26/21   Cleora Fleet, MD  hydrOXYzine (ATARAX) 25 MG tablet  12/30/22   [provider]  insulin glargine-yfgn (SEMGLEE) 100 UNIT/ML injection Inject 0.28 mLs (28 Units total) into the skin daily. 08/14/22   Noralee Stain, DO  ipratropium-albuterol (DUONEB) 0.5-2.5 (3) MG/3ML SOLN  11/01/22   [provider]  lanolin ointment Apply topically as needed for dry skin.    [provider]  lansoprazole (PREVACID) 30 MG capsule  02/27/23   [provider]  LANTUS 100 UNIT/ML injection Inject 30 Units into the skin at bedtime. 07/16/22   [provider]  levothyroxine (SYNTHROID) 88 MCG tablet Take 88 mcg by mouth daily before breakfast.    [provider]   loperamide (IMODIUM A-D) 2 MG tablet Take 2 mg by mouth as needed for diarrhea or loose stools. Do not exceed 4 per day    [provider]  loratadine (CLARITIN) 10 MG tablet Take 10 mg by mouth daily.    [provider]  magnesium oxide (MAG-OX) 400 (240 Mg) MG tablet Take 400 mg by mouth daily. 11/07/21   [provider]  Menthol-Zinc Oxide (CALMOSEPTINE) 0.44-20.6 % OINT Apply 1 Application topically as needed (preservation/protection after incontinent care).    [provider]  MYRBETRIQ 50 MG TB24 tablet  02/27/23   [provider]  NOVOLOG 100 UNIT/ML injection Inject into the skin. 04/19/23   [provider]  Nystatin (GERHARDT'S BUTT CREAM) CREA As directed 11/26/21   Standley Dakins L, MD  nystatin (MYCOSTATIN/NYSTOP) powder Apply topically 2 (two) times daily. 11/26/21   Johnson, Clanford L, MD  omeprazole (PRILOSEC) 20 MG capsule Take 20 mg by mouth in the morning and at bedtime.    [provider]  ondansetron (ZOFRAN) 4 MG tablet Take 8 mg by mouth 2 (two) times daily. 03/30/23   [provider]  potassium chloride SA (K-DUR) 20 MEQ tablet Take 20 mEq by mouth daily.    [provider]  PROCRIT 4000 UNIT/ML injection Inject 4,000 Units into the skin every Monday, Wednesday, and Friday. Hold if hemoglobin is > 10 08/01/22   [provider]  propranolol (INDERAL) 80 MG tablet Take 80 mg by mouth daily.    [provider]  saccharomyces boulardii (FLORASTOR) 250 MG capsule Take 250 mg by mouth 2 (two) times daily.    [provider]  SANTYL 250 UNIT/GM ointment Apply 1 Application topically daily. 05/08/23   [provider]  sertraline (ZOLOFT) 100 MG tablet  03/31/23   [provider]      Allergies    Carvedilol, Benicar [olmesartan], Ceftriaxone, Codeine, Sulfa antibiotics, and Trulicity [dulaglutide]    Review of Systems   Review of Systems  Unable to perform  ROS: Severe respiratory distress    Physical Exam Updated Vital Signs BP (!) 141/55   Pulse 96   Temp (!) 100.8 F (38.2 C)   Resp (!) 28   Ht 1.549 m (5\' 1" )   Wt 130.6 kg   LMP 08/06/2012   SpO2 94%   BMI 54.42 kg/m  Physical Exam Vitals and nursing note reviewed.  Constitutional:      General: She is not in acute distress.    Appearance: She is well-developed. She is obese. She is ill-appearing.  HENT:     Head: Normocephalic and atraumatic.  Eyes:     Conjunctiva/sclera: Conjunctivae normal.  Cardiovascular:     Rate and Rhythm: Normal rate and regular rhythm.     Heart sounds: No murmur heard. Pulmonary:     Effort: Respiratory distress present.  Breath sounds: Rhonchi present.  Abdominal:     Palpations: Abdomen is soft.     Tenderness: There is no abdominal tenderness.  Musculoskeletal:        General: No swelling.     Cervical back: Neck supple.     Right lower leg: No edema.     Left lower leg: No edema.  Skin:    General: Skin is warm and dry.     Capillary Refill: Capillary refill takes less than 2 seconds.  Neurological:     General: No focal deficit present.     Mental Status: She is alert.  Psychiatric:        Mood and Affect: Mood normal.     ED Results / Procedures / Treatments   Labs (all labs ordered are listed, but only abnormal results are displayed) Labs Reviewed  CBC WITH DIFFERENTIAL/PLATELET - Abnormal; Notable for the following components:      Result Value   WBC 17.3 (*)    Neutro Abs 15.8 (*)    Lymphs Abs 0.5 (*)    Abs Immature Granulocytes 0.11 (*)    All other components within normal limits  COMPREHENSIVE METABOLIC PANEL - Abnormal; Notable for the following components:   Sodium 134 (*)    Chloride 95 (*)    Glucose, Bld 291 (*)    BUN 44 (*)    Creatinine, Ser 2.03 (*)    Albumin 3.0 (*)    Alkaline Phosphatase 149 (*)    Total Bilirubin 2.1 (*)    GFR, Estimated 27 (*)    All other components within normal  limits  BRAIN NATRIURETIC PEPTIDE - Abnormal; Notable for the following components:   B Natriuretic Peptide 1,009.0 (*)    All other components within normal limits  BLOOD GAS, ARTERIAL - Abnormal; Notable for the following components:   pH, Arterial 7.29 (*)    pCO2 arterial 58 (*)    pO2, Arterial 118 (*)    All other components within normal limits  TROPONIN I (HIGH SENSITIVITY) - Abnormal; Notable for the following components:   Troponin I (High Sensitivity) 41 (*)    All other components within normal limits  SARS CORONAVIRUS 2 BY RT PCR  CULTURE, BLOOD (ROUTINE X 2)  CULTURE, BLOOD (ROUTINE X 2)  LACTIC ACID, PLASMA  URINALYSIS, ROUTINE W REFLEX MICROSCOPIC  LACTIC ACID, PLASMA    EKG EKG Interpretation Date/Time:  Monday June 27 2023 09:27:28 EDT Ventricular Rate:  98 PR Interval:  134 QRS Duration:  125 QT Interval:  384 QTC Calculation: 491 R Axis:   -77  Text Interpretation: Sinus rhythm Left anterior fascicular block ? Inferior MI Confirmed by Vanetta Mulders [phone]) on 06/27/2023 9:45:11 AM  Radiology DG Chest Port 1 View  Result Date: 06/27/2023 CLINICAL DATA:  Shortness of breath EXAM: PORTABLE CHEST 1 VIEW COMPARISON:  12/15/2021 x-ray FINDINGS: Enlarged cardiopericardial silhouette. Tortuous and ectatic aorta. No pneumothorax or effusion. No edema. Mild peribronchial thickening. There are rounded areas of opacity seen along both lung bases. Infiltrate is possible versus a mass lesion. Recommend further workup with CT when appropriate. IMPRESSION: Enlarged cardiopericardial silhouette with tortuous ectatic aorta. Rounded areas of opacity along both lung bases. Infiltrate versus mass lesion is possible. Recommend follow-up CT scan when appropriate Electronically Signed   By: Karen Kays M.D.   On: 06/27/2023 10:39    Procedures Procedures    Medications Ordered in ED Medications  0.9 %  sodium chloride infusion ( Intravenous Bolus  06/27/23 6440)    ED  Course/ Medical Decision Making/ A&P                             Medical Decision Making Amount and/or Complexity of Data Reviewed Labs: ordered. Radiology: ordered.  Risk OTC drugs. Prescription drug management. Decision regarding hospitalization.   Respiratory distress from nursing facility came in on EMS CPAP.  We switched her over to BiPAP here.  Patient febrile here.  CRITICAL CARE Performed by: Vanetta Mulders Total critical care time: 60 minutes Critical care time was exclusive of separately billable procedures and treating other patients. Critical care was necessary to treat or prevent imminent or life-threatening deterioration. Critical care was time spent personally by me on the following activities: development of treatment plan with patient and/or surrogate as well as nursing, discussions with consultants, evaluation of patient's response to treatment, examination of patient, obtaining history from patient or surrogate, ordering and performing treatments and interventions, ordering and review of laboratory studies, ordering and review of radiographic studies, pulse oximetry and re-evaluation of patient's condition.   Chest x-ray consistent with bilateral lower lung field opacities.,  Multifocal pneumonia.  White count is 17.3 but lactic acid is normal.  Complete metabolic panel total bili 2.1 alk phos 149 GFR of 27.  Blood sugar 291 on potassium good at 4.1.  Patient known to have a history of significant renal insufficiency initial troponin is 41 will need delta troponin because EKG injury raise some question about inferior MI.  Chest x-ray did not seem to be consistent with pulmonary edema.  COVID testing was negative.  BNP is in process.  That was ordered because she is got a history of CHF.  Blood cultures are pending.  Chest x-ray specifically showed rounded areas of opacities along both lung bases infiltrate versus mass lesion they do recommend follow-up CT.  Patient did  not have any complaint of chest pain.  Arterial blood gas pH was 7.29 with a pCO2 of 58 pO2 118.  Patient now much more alert doing much better.  Will contact hospitalist for admission.  Patient will be started on Levaquin because supposedly she is got a allergy to cephalosporins.  Although is nonspecific.  Also patient did come from nursing facility.  Based on vital signs not meeting sepsis criteria at this point.  Does have a fever.  Respiratory rate was up but blood pressure is not down lactic acid was normal.  White count is over 14,000.  Final Clinical Impression(s) / ED Diagnoses Final diagnoses:  Acute respiratory failure with hypoxia [facility])  Multifocal pneumonia    Rx / DC Orders ED Discharge Orders     None         Vanetta Mulders, MD 06/27/23 [id number]    Vanetta Mulders, MD 06/27/23 1100    Vanetta Mulders, MD 06/27/23 1101

## 2023-06-27 NOTE — Assessment & Plan Note (Signed)
-  Patient did not use oxygen at baseline -Acute hypoxemic respiratory failure appears to be multifactorial in the setting of COPD exacerbation, CHF exacerbation and multifocal pneumonia.   -Blood cultures positive for haemophilus influenza. -Will continue IV antibiotics using Unasyn per protocol; continue steroids, bronchodilator management and mucolytic's. -Flutter valve and incentive spirometer provided. -Will continue BiPAP nightly (patient body habitus and prior history suggesting presence of obesity hypoventilation syndrome-most likely OSA) and continue to wean off oxygen supplementation as tolerated. -Continue daily weights, strict I's and O's and IV diuresis -Follow clinical response. -Patient has confirmed DNR.

## 2023-06-27 NOTE — Progress Notes (Signed)
Patient has not voided since admitted to unit, IV lasix given a short time ago with no results yet. Bladder scan done at 1849 with result of 149 ml. Dr Gwenlyn Perking made aware, continue to monitor per verbal from Dr Gwenlyn Perking. Night RN Derrick Ravel made aware.

## 2023-06-27 NOTE — Progress Notes (Signed)
Patient taken from ICU to CT and back without any complications.

## 2023-06-27 NOTE — Assessment & Plan Note (Signed)
-  With acute exacerbation as already mentioned -Continue current antibiotics, bronchodilator management, steroids and oxygen supplementation. -Flutter valve and incentive spirometer has been ordered and encouraged with patient.

## 2023-06-27 NOTE — H&P (Signed)
History and Physical    Patient: [name]:[id number] DOB: 05/21/1961 DOA: 06/27/2023 DOS: the patient was seen and examined on 06/27/2023 PCP: [name], NP  Patient coming from: SNF  Chief Complaint:  Chief Complaint  Patient presents with   Respiratory Distress   HPI: [name] is a 62 y.o. 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  Patient's symptoms have been present since Friday (06/24/2023) and has been worsening throughout.  Initial chest x-ray and COVID test as an outpatient failed to demonstrate acute abnormalities.  Given further deterioration patient has been transferred to the emergency department for further treatment.  Workup demonstrating vascular congestion and new bilateral infiltrates suggestive of pneumonia along with acute on chronic heart failure. Patient require the use of BiPAP to stabilize oxygen saturation and ease of respiratory distress.  Patient has confirmed to be DNR and to not one intubation if required.  In the ED cultures taken, antibiotics given and TRH consulted to place patient in the hospital for further evaluation and management.  Of note, patient denies abdominal pain, chest pain, nausea, vomiting, dysuria, hematuria, melena, hematochezia or focal weaknesses.   Review of Systems: As mentioned in the history of present illness. All other systems reviewed and are negative.  Past Medical History:  Diagnosis Date   Anemia    Arthritis    Asthma    Cancer of sigmoid (HCC)    COPD (chronic obstructive pulmonary disease) (HCC)    Depression    Diabetes mellitus    Diastolic dysfunction 05/15/2015    Dyspnea    Generalized weakness    Hyperlipidemia    Hypertension    Hypothyroidism    Obesity    Palpitations    Pneumonia    PONV (postoperative nausea and vomiting)    Prolonged QT interval 05/14/2015   Sleep apnea    Past Surgical History:  Procedure Laterality Date   BIOPSY  04/06/2022   Procedure: BIOPSY;  Surgeon: Dolores Frame, MD;  [facility];  Service: Gastroenterology;;   CATARACT EXTRACTION     CESAREAN SECTION     CHOLECYSTECTOMY     COLONOSCOPY WITH PROPOFOL N/A 04/06/2022   Procedure: COLONOSCOPY WITH PROPOFOL;  Surgeon: Dolores Frame, MD;  [facility];  Service: Gastroenterology;  Laterality: N/A;  805 ASA 2 patient in [facility]   ESOPHAGOGASTRODUODENOSCOPY (EGD) WITH PROPOFOL N/A 04/06/2022   Procedure: ESOPHAGOGASTRODUODENOSCOPY (EGD) WITH PROPOFOL;  Surgeon: Dolores Frame, MD;  [facility];  Service: Gastroenterology;  Laterality: N/A;   HEMOSTASIS CLIP PLACEMENT  04/06/2022   Procedure: HEMOSTASIS CLIP PLACEMENT;  Surgeon: Dolores Frame, MD;  [facility];  Service: Gastroenterology;;   POLYPECTOMY  04/06/2022   Procedure: POLYPECTOMY;  Surgeon: Dolores Frame, MD;  [facility];  Service: Gastroenterology;;   SUBMUCOSAL TATTOO INJECTION  04/06/2022   Procedure: SUBMUCOSAL TATTOO INJECTION;  Surgeon: Dolores Frame, MD;  [facility];  Service: Gastroenterology;;   Social History:  reports that she has never smoked. She has been exposed to tobacco smoke. She has never used smokeless tobacco. She reports that she does not drink alcohol and does not use drugs.  Allergies  Allergen Reactions  Carvedilol Palpitations   Benicar [Olmesartan] Swelling   Codeine Other (See Comments)    Confusion    Sulfa Antibiotics Swelling    Whole face swells   Trulicity [Dulaglutide] Diarrhea   Ceftriaxone Hives and Rash     Family History  Problem Relation Age of Onset   Stroke Mother    Diabetes Father    Heart failure Father    Hypertension Father    Diabetes Sister    Heart failure Sister    Hypertension Sister    Stroke Sister    Cancer Other    Stroke Sister     Prior to Admission medications   Medication Sig Start Date End Date Taking? Authorizing Provider  ADMELOG 100 UNIT/ML injection Inject 4-8 Units into the skin 3 (three) times daily with meals. If blood glucose is 200-250= 4 units, 250-300= 5 units, 301-350= 6 units, 351-400= 8 units, > 400 notify MD 04/21/22  Yes [provider]  albuterol (VENTOLIN HFA) 108 (90 Base) MCG/ACT inhaler Inhale 2 puffs into the lungs every 6 (six) hours as needed for wheezing or shortness of breath.   Yes [provider]  alum & mag hydroxide-simeth (MAALOX/MYLANTA) 200-200-20 MG/5ML suspension Take 30 mLs by mouth every 2 (two) hours as needed for indigestion or heartburn. Do not exceed 4 doses in 24 hours   Yes [provider]  amLODipine (NORVASC) 5 MG tablet Take 5 mg by mouth daily. 03/30/23  Yes [provider]  ascorbic acid (VITAMIN C) 500 MG tablet Take 500 mg by mouth daily.   Yes [provider]  aspirin EC 81 MG tablet Take 81 mg by mouth daily. Swallow whole.   Yes [provider]  atorvastatin (LIPITOR) 10 MG tablet Take 10 mg by mouth at bedtime. 09/29/21  Yes [provider]  Biotin 10 MG TABS Take 1 tablet by mouth daily.   Yes [provider]  cetirizine (ZYRTEC) 10 MG tablet Take 10 mg by mouth daily.   Yes [provider]  Cholecalciferol (VITAMIN D) 50 MCG (2000 UT) CAPS Take 2,000 Units by mouth daily.   Yes [provider]  Cranberry 600 MG TABS Take 300 mg by mouth 2 (two) times daily. Half tab bid   Yes [provider]  docusate sodium (COLACE) 100 MG capsule Take 100 mg by mouth daily.   Yes [provider]  Emollient (EUCERIN)  lotion Apply 1 Application topically every 12 (twelve) hours as needed for dry skin.   Yes [provider]  ferrous sulfate 325 (65 FE) MG tablet Take 325 mg by mouth daily.   Yes [provider]  furosemide (LASIX) 20 MG tablet Take 20 mg by mouth daily. 03/29/22  Yes [provider]  gabapentin (NEURONTIN) 300 MG capsule Take 1 capsule in AM, 2 capsules in PM Patient taking differently: Take 300-600 mg by mouth in the morning and at bedtime. Take 1 capsule in AM, 2 capsules in PM 03/21/23  Yes Van Clines, MD  hydrALAZINE (APRESOLINE) 25 MG tablet Take 1 tablet (25 mg total) by mouth every 8 (eight) hours. 11/26/21  Yes Johnson, Clanford L, MD  lansoprazole (PREVACID) 30 MG capsule Take 30 mg by mouth 2 (two) times daily before a meal. 02/27/23  Yes [provider]  LANTUS 100 UNIT/ML injection Inject 35 Units into the skin at bedtime. 07/16/22  Yes [provider]  levothyroxine (SYNTHROID) 88 MCG tablet Take 88 mcg by mouth daily before breakfast.  Yes [provider]  Menthol-Zinc Oxide (CALMOSEPTINE) 0.44-20.6 % OINT Apply 1 Application topically as needed (preservation/protection after incontinent care).   Yes [provider]  MYRBETRIQ 50 MG TB24 tablet Take 50 mg by mouth daily. 02/27/23  Yes [provider]  NOVOLOG 100 UNIT/ML injection Inject 10 Units into the skin 3 (three) times daily with meals. Hold if resident is not eating or if BG is less than 150 04/19/23  Yes [provider]  ondansetron (ZOFRAN) 4 MG tablet Take 8 mg by mouth 2 (two) times daily. 03/30/23  Yes [provider]  potassium chloride SA (K-DUR) 20 MEQ tablet Take 20 mEq by mouth daily.   Yes [provider]  propranolol (INDERAL) 80 MG tablet Take 80 mg by mouth daily.   Yes [provider]  saccharomyces boulardii (FLORASTOR) 250 MG capsule Take 250 mg by mouth 2 (two) times daily.   Yes [provider]   sertraline (ZOLOFT) 100 MG tablet Take 100 mg by mouth daily. 03/31/23  Yes [provider]  Glucerna (GLUCERNA) LIQD Take 237 mLs by mouth daily. For wound healing    [provider]  PROCRIT 4000 UNIT/ML injection Inject 4,000 Units into the skin every Monday, Wednesday, and Friday. Hold if hemoglobin is > 10 08/01/22   [provider]  SANTYL 250 UNIT/GM ointment Apply 1 Application topically daily. Patient not taking: Reported on 06/27/2023 05/08/23   [provider]    Physical Exam: Vitals:   06/27/23 1416 06/27/23 1428 06/27/23 1453 06/27/23 1500  BP:    (!) 112/45  Pulse: 90 91 88 88  Resp: (!) 28 (!) 29 (!) 31 (!) 25  Temp:      TempSrc:      SpO2: (!) 87% 90% 96% 95%  Weight:      Height:       General exam: Somnolent, but easily aroused; oriented x 3.  Reports no chest pain. Respiratory system: Decreased air movement bilaterally, positive expiratory wheezing and fine crackles at the bases.  Positive tachypnea appreciated.  BiPAP in place. Cardiovascular system:RRR. No rubs or gallops; unable to assess JVD with body habitus. Gastrointestinal system: Abdomen is obese, nondistended, soft and nontender. No organomegaly or masses felt. Normal bowel sounds heard. Central nervous system: Alert and oriented. No focal neurological deficits. Extremities: No cyanosis or clubbing; 1-2+ edema appreciated bilaterally. Skin: No petechiae. Psychiatry: Judgement and insight appear normal. Mood & affect appropriate.   Data Reviewed: Arterial blood gas: pH 7.29/CO2 52/pO2 69/bicarb 25 and saturation 94.1 Lactic acid: 1.7 TSH 2.09 Troponin: 42>>>>43 Comprehensive metabolic panel: Sodium 134, potassium 4.1, chloride 95, bicarb 26, glucose 291, creatinine 2.03, alkaline phosphatase 149, AST 29, ALT 35, GFR 27 CBC: White blood cells 17.3, hemoglobin 0.0, platelet count 277 K.  Assessment and Plan: * Acute respiratory failure with hypoxia (HCC) -During of  COPD exacerbation and acute on chronic heart failure -Patient requiring BiPAP at time of admission -Chest x-ray demonstrating vascular congestion and bilateral lower infiltrates suggestive of infection -There are also some abnormalities seen on chest x-ray with concern for mass effect; radiology recommending CT scan for better evaluation. -Continue IV antibiotics, steroids, bronchodilator/nebulizer management, strict I's and O's, low-sodium diet, daily weights, IV Lasix and oxygen supplementation. -Follow clinical response and continue providing supportive care. -Patient has verbalized confirmation of DNR status specifically not looking to be intubated if her breathing further decompensate.  Acute on chronic diastolic CHF (congestive heart failure) (HCC) -Patient with vascular congestion  appreciated on x-ray and an elevated BNP -Decreased breath sounds at the bases and fine crackles. -Follow daily weights, strict I's and O's and low-sodium diet -IV diuresis will be started -Echo to be updated.  GERD (gastroesophageal reflux disease) -Continue PPI.  Mixed hyperlipidemia -Continue statin  Morbid obesity with BMI of 50.0-59.9, adult (HCC) -Body mass index is 54.11 kg/m. -Low-calorie diet, portion control and increase physical activity discussed with patient.  OSA (obstructive sleep apnea) -Continue the use of BiPAP nightly.  Acute renal failure superimposed on chronic kidney disease (HCC) -Patient creatinine higher than baseline and what appears to be decreased perfusion due to acute on chronic heart failure -Avoid hypotension, minimize the use of contrast and closely follow renal function while diuresing patient -Continue strict I's and O's.  Prolonged QT interval -Continue telemetry monitoring. -replete electrolytes as needed and minimize the use of medications that can further prolong QT.  Depression -Stable mood/flat affect -Continue home antidepressant management. -No  hallucinations and no suicidal ideation currently.  COPD (chronic obstructive pulmonary disease) (HCC) -With acute exacerbation as already mentioned -Continue current antibiotics, bronchodilator management and oxygen supplementation.  Essential hypertension -Stable for the most part -Continue current antihypertensive agents -Follow-up vital signs -Heart healthy diet has been ordered.  Diabetes mellitus (HCC) -Pete A1c -Continue sliding scale insulin and the use of Semglee -Follow CBGs fluctuation and adjust hypoglycemic regimen as needed -Anticipate increase in blood sugar levels due to the use of his steroids -Modified carbohydrate diet has been requested.    Advance Care Planning:   Code Status: DNR   Consults: None  Family Communication: No family at bedside.  Severity of Illness: The appropriate patient status for this patient is INPATIENT. Inpatient status is judged to be reasonable and necessary in order to provide the required intensity of service to ensure the patient's safety. The patient's presenting symptoms, physical exam findings, and initial radiographic and laboratory data in the context of their chronic comorbidities is felt to place them at high risk for further clinical deterioration. Furthermore, it is not anticipated that the patient will be medically stable for discharge from the hospital within 2 midnights of admission.   * I certify that at the point of admission it is my clinical judgment that the patient will require inpatient hospital care spanning beyond 2 midnights from the point of admission due to high intensity of service, high risk for further deterioration and high frequency of surveillance required.*  Author: Vassie Loll, MD 06/27/2023 4:29 PM  For on call review [email or web address].

## 2023-06-27 NOTE — Consult Note (Signed)
Pharmacy Antibiotic Note  ASSESSMENT: 62 y.o. female with PMH COPD, asthma, obesity, OSA, HTN, DM, h/o prolonged QT is presenting with pneumonia. She is febrile (Tmax 100.8) with leukocytosis and is tachypneic, satting 100% on CPAP. CXR notable for rounded areas of opacity along both lung bases, concerning for pneumonia vs. mass lesion. Pharmacy has been consulted to manage vancomycin dosing. Patient in AKI with Scr 2.03 today, up from baseline of 1.4-1.5 in May 2024.  Patient measurements: Height: 5\' 1"  (154.9 cm) Weight: 130.6 kg (288 lb) IBW/kg (Calculated) : 47.8  Vital signs: Temp: 100.8 F (38.2 C) (07/22 0944) Temp Source: Axillary (07/22 0936) BP: 141/55 (07/22 0952) Pulse Rate: 96 (07/22 0952) Recent Labs  Lab 06/27/23 0952  WBC 17.3*  CREATININE 2.03*   Estimated Creatinine Clearance: 36.7 mL/min (A) (by C-G formula based on SCr of 2.03 mg/dL (H)).  Allergies: Allergies  Allergen Reactions   Carvedilol Palpitations   Benicar [Olmesartan] Swelling   Ceftriaxone Other (See Comments)    Per MAR   Codeine Other (See Comments)    Confusion    Sulfa Antibiotics Swelling    Whole face swells   Trulicity [Dulaglutide] Diarrhea    Antimicrobials this admission: Vancomycin 7/22 >> Levofloxacin 7/22 >>  Dose adjustments this admission: N/A  Microbiology results: 7/22 BCx: sent 7/22 COVID: negative  7/22 MRSA PCR: ordered  PLAN: Initiate vancomycin 2500 mg IV x 1 as a load Dosing by level while patient in AKI Follow up culture results to assess for antibiotic optimization. Monitor renal function to assess for any necessary antibiotic dosing changes.   Thank you for allowing pharmacy to be a part of this patient's care.  Will M. Dareen Piano, PharmD Clinical Pharmacist 06/27/2023 11:09 AM

## 2023-06-27 NOTE — Progress Notes (Signed)
CT of chest ordered and patient on Bipap at 100% FIO2 with sats dropping to 82% if moving patient and at times O2 sat only 85-87% when laying on back with head of bed raised. Turned patient on right side with good effect, O2 sat 94% on bipap at 100% FIO2 currently. Verbal order ok to wait until patient more stable to go to CT. Spoke with CT and updated them. Will call them when patient stable enough to do CT. Also will pass on to night RN if needing to still be done.

## 2023-06-27 NOTE — Progress Notes (Signed)
Pt started desating to 82%, nurse increased FIO2 to 100% on bipap and repositioned pt for optimal breathing, pt's O2 increased and sustained to 85%, RT gave breathing treatment, O2 increased to 90%, Nurse and RT hear little to minimal air movement on the left side, MD notified, orders pending.

## 2023-06-27 NOTE — Assessment & Plan Note (Signed)
-  Patient with vascular congestion appreciated on x-ray and an elevated BNP -follow 2-D echo -continue IV diuresis -Strict I's and O's, daily weight and low-sodium diet to be followed. -Continue beta-blocker.

## 2023-06-27 NOTE — Assessment & Plan Note (Signed)
-  Continue the use of BiPAP nightly.

## 2023-06-27 NOTE — Assessment & Plan Note (Signed)
Continue PPI ?

## 2023-06-27 NOTE — Progress Notes (Signed)
Patient arrived via EMS on CPAP 100% placed on BIPAP 16/8, RR 22, 100% ABG drawn initially for baseline on prior EMS settings.

## 2023-06-27 NOTE — TOC Initial Note (Addendum)
Transition of Care [facility]) - Initial/Assessment Note    Patient Details  Name: [name] MRN: [id number] Date of Birth: Jun 04, 1961  [facility]) CM/SW Contact:    Karn Cassis, LCSW Phone Number: 06/27/2023, 2:49 PM  Clinical Narrative: Pt admitted with acute respiratory failure with hypoxia. Assessment completed due to high risk readmission score. Per husband, pt has been a resident at [facility] for over a year. Requests return at d/c. LCSW spoke with [name] at SNF who confirms pt is long term resident and okay to return. TOC will continue to follow.                  TOC received consult for CHF screening. Per SNF, pt is on cardiac diet and takes medications as prescribed. Pt is currently weighed once a month. LCSW requested SNF weigh pt more often due to CHF.    Expected Discharge Plan: Skilled Nursing Facility Barriers to Discharge: Continued Medical Work up   Patient Goals and CMS Choice Patient states their goals for this hospitalization and ongoing recovery are:: return to SNF   Choice offered to / list presented to : Spouse [place] ownership interest in [facility].provided to::  (n/a)    Expected Discharge Plan and Services In-house Referral: Clinical Social Work   Post Acute Care Choice: Resumption of Svcs/PTA Provider Living arrangements for the past 2 months: Skilled Nursing Facility                                      Prior Living Arrangements/Services Living arrangements for the past 2 months: Skilled Nursing Facility Lives with:: Facility Resident Patient language and need for interpreter reviewed:: Yes Do you feel safe going back to the place where you live?: Yes      Need for Family Participation in Patient Care: Yes (Comment) Care giver support system in place?: Yes (comment)   Criminal Activity/Legal Involvement Pertinent to Current Situation/Hospitalization: No - Comment as needed  Activities of  Daily Living      Permission Sought/Granted Permission sought to share information with : Other (comment) (SNF) Permission granted to share information with : Yes, Verbal Permission Granted     Permission granted to share info w AGENCY: [employer] granted to share info w Relationship: SNF     Emotional Assessment   Attitude/Demeanor/Rapport: Unable to Assess Affect (typically observed): Unable to Assess   Alcohol / Substance Use: Not Applicable Psych Involvement: No (comment)  Admission diagnosis:  Acute respiratory failure with hypoxia (HCC) [J96.01] Multifocal pneumonia [J18.9] Patient Active Problem List   Diagnosis Date Noted   Acute respiratory failure with hypoxia (HCC) 06/27/2023   Colon cancer (HCC) 08/11/2022   Cancer of sigmoid colon (HCC) 05/12/2022   Diarrhea of presumed infectious origin 03/09/2022   Acute metabolic encephalopathy 12/16/2021   Iron deficiency anemia    Pressure injury of skin 11/24/2021   Open wound of left foot 11/22/2021   Chronic venous stasis 11/22/2021   Leukocytosis 11/22/2021   Thrombocytosis 11/22/2021   Hyperglycemia due to diabetes mellitus (HCC) 11/22/2021   Lactic acidosis 11/22/2021   Hypoalbuminemia due to protein-calorie malnutrition (HCC) 11/22/2021   Elevated liver enzymes 11/22/2021   Mixed hyperlipidemia 11/22/2021   GERD (gastroesophageal reflux disease) 11/22/2021   Chronic diastolic CHF (congestive heart failure) (HCC) 11/22/2021   Diabetic foot infection (HCC) 11/22/2021   Uncontrolled type 2  diabetes mellitus with hyperglycemia, with long-term current use of insulin (HCC) 11/22/2021   Lower extremity cellulitis 11/21/2021   Morbid obesity with BMI of 50.0-59.9, adult (HCC) 01/29/2020   Educated about COVID-19 virus infection 01/29/2020   OSA (obstructive sleep apnea) 12/21/2019   Asthma 12/21/2019   Diastolic dysfunction 05/15/2015   Prolonged QT interval 05/14/2015   Palpitations 07/23/2014    Generalized weakness 07/23/2014   Diabetes mellitus (HCC) 07/22/2014   Essential hypertension 07/22/2014   COPD (chronic obstructive pulmonary disease) (HCC) 07/22/2014   Depression 07/22/2014   PCP:  [name], NP Pharmacy:   [facility] [place], [place] - [address] [place] [address] Phone: [phone] Fax: [phone]     Social Determinants of Health (SDOH) Social History: SDOH Screenings   Food Insecurity: Patient Declined (08/12/2022)  Housing: Low Risk  (08/12/2022)  Transportation Needs: No Transportation Needs (08/12/2022)  Utilities: Not At Risk (08/12/2022)  Financial Resource Strain: Medium Risk (08/14/2021)   Received from [facility], [facility]  Physical Activity: Inactive (08/14/2021)   Received from [facility], [facility]  Social Connections: Unknown (04/08/2022)   Received from [facility], [facility]  Stress: Stress Concern Present (08/14/2021)   Received from [facility], [facility]  Tobacco Use: Low Risk  (06/14/2023)   Received from [facility]  Recent Concern: Tobacco Use - Medium Risk (03/21/2023)   SDOH Interventions:     Readmission Risk Interventions    06/27/2023    2:47 PM 12/17/2021   11:29 AM 11/23/2021    2:32 PM  Readmission Risk Prevention Plan  Transportation Screening Complete Complete Complete  Home Care Screening  Complete   Medication Review (RN CM)  Complete Complete  HRI or Home Care Consult Complete    Social Work Consult for Recovery Care Planning/Counseling Complete    Palliative Care Screening Not Applicable    Medication Review Oceanographer) Complete

## 2023-06-27 NOTE — Plan of Care (Signed)
  Problem: Education: Goal: Ability to demonstrate management of disease process will improve Outcome: Progressing Goal: Ability to verbalize understanding of medication therapies will improve Outcome: Progressing Goal: Individualized Educational Video(s) Outcome: Progressing   Problem: Activity: Goal: Capacity to carry out activities will improve Outcome: Progressing   Problem: Cardiac: Goal: Ability to achieve and maintain adequate cardiopulmonary perfusion will improve Outcome: Progressing   Problem: Education: Goal: Ability to demonstrate management of disease process will improve Outcome: Progressing Goal: Ability to verbalize understanding of medication therapies will improve Outcome: Progressing Goal: Individualized Educational Video(s) Outcome: Progressing   Problem: Activity: Goal: Capacity to carry out activities will improve Outcome: Progressing   Problem: Cardiac: Goal: Ability to achieve and maintain adequate cardiopulmonary perfusion will improve Outcome: Progressing   Problem: Activity: Goal: Ability to tolerate increased activity will improve Outcome: Progressing   Problem: Clinical Measurements: Goal: Ability to maintain a body temperature in the normal range will improve Outcome: Progressing   Problem: Respiratory: Goal: Ability to maintain adequate ventilation will improve Outcome: Progressing Goal: Ability to maintain a clear airway will improve Outcome: Progressing   Problem: Education: Goal: Ability to describe self-care measures that may prevent or decrease complications (Diabetes Survival Skills Education) will improve Outcome: Progressing Goal: Individualized Educational Video(s) Outcome: Progressing   Problem: Coping: Goal: Ability to adjust to condition or change in health will improve Outcome: Progressing   Problem: Fluid Volume: Goal: Ability to maintain a balanced intake and output will improve Outcome: Progressing   Problem:  Health Behavior/Discharge Planning: Goal: Ability to identify and utilize available resources and services will improve Outcome: Progressing Goal: Ability to manage health-related needs will improve Outcome: Progressing   Problem: Metabolic: Goal: Ability to maintain appropriate glucose levels will improve Outcome: Progressing   Problem: Nutritional: Goal: Maintenance of adequate nutrition will improve Outcome: Progressing Goal: Progress toward achieving an optimal weight will improve Outcome: Progressing   Problem: Skin Integrity: Goal: Risk for impaired skin integrity will decrease Outcome: Progressing   Problem: Tissue Perfusion: Goal: Adequacy of tissue perfusion will improve Outcome: Progressing   Problem: Education: Goal: Knowledge of General Education information will improve Description: Including pain rating scale, medication(s)/side effects and non-pharmacologic comfort measures Outcome: Progressing   Problem: Health Behavior/Discharge Planning: Goal: Ability to manage health-related needs will improve Outcome: Progressing   Problem: Clinical Measurements: Goal: Ability to maintain clinical measurements within normal limits will improve Outcome: Progressing Goal: Will remain free from infection Outcome: Progressing Goal: Diagnostic test results will improve Outcome: Progressing Goal: Respiratory complications will improve Outcome: Progressing Goal: Cardiovascular complication will be avoided Outcome: Progressing   Problem: Activity: Goal: Risk for activity intolerance will decrease Outcome: Progressing   Problem: Nutrition: Goal: Adequate nutrition will be maintained Outcome: Progressing   Problem: Coping: Goal: Level of anxiety will decrease Outcome: Progressing   Problem: Elimination: Goal: Will not experience complications related to bowel motility Outcome: Progressing Goal: Will not experience complications related to urinary  retention Outcome: Progressing

## 2023-06-28 DIAGNOSIS — J9601 Acute respiratory failure with hypoxia: Secondary | ICD-10-CM | POA: Diagnosis not present

## 2023-06-28 DIAGNOSIS — R9431 Abnormal electrocardiogram [ECG] [EKG]: Secondary | ICD-10-CM

## 2023-06-28 DIAGNOSIS — I5033 Acute on chronic diastolic (congestive) heart failure: Secondary | ICD-10-CM | POA: Diagnosis not present

## 2023-06-28 DIAGNOSIS — I10 Essential (primary) hypertension: Secondary | ICD-10-CM | POA: Diagnosis not present

## 2023-06-28 LAB — BLOOD CULTURE ID PANEL (REFLEXED) - BCID2

## 2023-06-28 LAB — BASIC METABOLIC PANEL
Anion gap: 16 — ABNORMAL HIGH (ref 5–15)
BUN: 57 mg/dL — ABNORMAL HIGH (ref 8–23)
CO2: 22 mmol/L (ref 22–32)
Calcium: 8.7 mg/dL — ABNORMAL LOW (ref 8.9–10.3)
Chloride: 99 mmol/L (ref 98–111)
Creatinine, Ser: 2.37 mg/dL — ABNORMAL HIGH (ref 0.44–1.00)
GFR, Estimated: 23 mL/min — ABNORMAL LOW (ref >60)
Glucose, Bld: 280 mg/dL — ABNORMAL HIGH (ref 70–99)
Potassium: 4 mmol/L (ref 3.5–5.1)
Sodium: 137 mmol/L (ref 135–145)

## 2023-06-28 LAB — CBC
HCT: 37.9 % (ref 36.0–46.0)
Hemoglobin: 12.1 g/dL (ref 12.0–15.0)
MCH: 27.9 pg (ref 26.0–34.0)
MCHC: 31.9 g/dL (ref 30.0–36.0)
MCV: 87.3 fL (ref 80.0–100.0)
Platelets: 235 10*3/uL (ref 150–400)
RBC: 4.34 MIL/uL (ref 3.87–5.11)
RDW: 14.6 % (ref 11.5–15.5)
WBC: 14.2 10*3/uL — ABNORMAL HIGH (ref 4.0–10.5)
nRBC: 0 % (ref 0.0–0.2)

## 2023-06-28 LAB — GLUCOSE, CAPILLARY
Glucose-Capillary: 249 mg/dL — ABNORMAL HIGH (ref 70–99)
Glucose-Capillary: 323 mg/dL — ABNORMAL HIGH (ref 70–99)
Glucose-Capillary: 339 mg/dL — ABNORMAL HIGH (ref 70–99)
Glucose-Capillary: 368 mg/dL — ABNORMAL HIGH (ref 70–99)
Glucose-Capillary: 331 mg/dL — ABNORMAL HIGH (ref 70–99)
Glucose-Capillary: 338 mg/dL — ABNORMAL HIGH (ref 70–99)
Glucose-Capillary: 274 mg/dL — ABNORMAL HIGH (ref 70–99)

## 2023-06-28 LAB — CULTURE, BLOOD (ROUTINE X 2): Special Requests: ADEQUATE

## 2023-06-28 LAB — HIV ANTIBODY (ROUTINE TESTING W REFLEX): HIV Screen 4th Generation wRfx: NONREACTIVE

## 2023-06-28 MED ORDER — PROPRANOLOL HCL ER 80 MG PO CP24
80.0000 mg | ORAL_CAPSULE | Freq: Once | ORAL | Status: AC
  Administered 2023-06-28: 80 mg via ORAL
  Filled 2023-06-28: qty 1

## 2023-06-28 MED ORDER — SODIUM CHLORIDE 0.9 % IV SOLN
3.0000 g | Freq: Four times a day (QID) | INTRAVENOUS | Status: DC
  Administered 2023-06-28 – 2023-07-02 (×16): 3 g via INTRAVENOUS
  Filled 2023-06-28 (×2): qty 8
  Filled 2023-06-28 (×2): qty 3
  Filled 2023-06-28 (×17): qty 8

## 2023-06-28 MED ORDER — PROPRANOLOL HCL ER 80 MG PO CP24
80.0000 mg | ORAL_CAPSULE | Freq: Every day | ORAL | Status: DC
  Administered 2023-06-29 – 2023-07-01 (×3): 80 mg via ORAL
  Filled 2023-06-28 (×7): qty 1

## 2023-06-28 MED ORDER — ORAL CARE MOUTH RINSE
15.0000 mL | OROMUCOSAL | Status: DC
  Administered 2023-06-28 – 2023-07-01 (×12): 15 mL via OROMUCOSAL

## 2023-06-28 MED ORDER — ORAL CARE MOUTH RINSE: 15.0000 mL | OROMUCOSAL | Status: DC | PRN

## 2023-06-28 MED ORDER — FUROSEMIDE 10 MG/ML IJ SOLN
40.0000 mg | Freq: Every day | INTRAMUSCULAR | Status: DC
  Administered 2023-06-29: 40 mg via INTRAVENOUS
  Filled 2023-06-28: qty 4

## 2023-06-28 NOTE — Progress Notes (Signed)
Patient taken off of BiPAP and placed on 15L HFNC. Patients sats are 96% and she is tolerating well at this time. BiPAP remains at bedside when needed. RN aware.

## 2023-06-28 NOTE — Progress Notes (Signed)
   06/28/23 0912  ReDS Vest / Clip  Station Marker B  Ruler Value 42  ReDS Value Range < 36  ReDS Actual Value 35  Anatomical Comments  (Sitting up in chair)

## 2023-06-28 NOTE — Progress Notes (Addendum)
Date and time results received: 06/28/23 @9 :42am Test: blood culture Critical Value: gram negative rod  Name of Provider Notified: Dr. Gwenlyn Perking on 06/28/23 @ 9:44am  Orders Received: MD response receiving appropriate antibiotics; will follow speciation and sensitivity @ 10:00am  Morrie Sheldon from lab called saying that pt.'s 3rd set of blood cultures came back with gram negative rod. MD [facility] notified. No New orders.

## 2023-06-28 NOTE — Inpatient Diabetes Management (Signed)
Inpatient Diabetes Program Recommendations  AACE/ADA: New Consensus Statement on Inpatient Glycemic Control   Target Ranges:  Prepandial:   less than 140 mg/dL      Peak postprandial:   less than 180 mg/dL (1-2 hours)      Critically ill patients:  140 - 180 mg/dL    Latest Reference Range & Units 06/27/23 12:05 06/27/23 13:28 06/27/23 16:27 06/27/23 18:52 06/27/23 22:21 06/28/23 05:44  Glucose-Capillary 70 - 99 mg/dL 161 (H) 096 (H) 045 (H) 277 (H) 241 (H) 249 (H)    Review of Glycemic Control  Diabetes history: DM2 Outpatient Diabetes medications: Lantus 35 units at bedtime, Novolog 10 units TID with meals if CBG over 150 mg/dl,  Current orders for Inpatient glycemic control: Semglee 12 units at bedtime, Novolog 0-15 units TID, Novolog 0-5 units at bedtime; Solumedrol 40 mg Q12H  Inpatient Diabetes Program Recommendations:    Insulin: If steroids are continued, please consider increasing Semglee to 20 units at bedtime and ordering Novolog 6 units TID with meals for meal coverage if patient eats at least 50% of meals.  Thanks, Orlando Penner, RN, MSN, CDCES Diabetes Coordinator Inpatient Diabetes Program [phone] (Team Pager from 8am to 5pm)

## 2023-06-28 NOTE — Plan of Care (Signed)
  Problem: Education: Goal: Ability to demonstrate management of disease process will improve Outcome: Progressing Goal: Ability to verbalize understanding of medication therapies will improve Outcome: Progressing Goal: Individualized Educational Video(s) Outcome: Progressing   Problem: Cardiac: Goal: Ability to achieve and maintain adequate cardiopulmonary perfusion will improve Outcome: Progressing   Problem: Clinical Measurements: Goal: Ability to maintain a body temperature in the normal range will improve Outcome: Progressing   Problem: Respiratory: Goal: Ability to maintain adequate ventilation will improve Outcome: Progressing   Problem: Education: Goal: Ability to describe self-care measures that may prevent or decrease complications (Diabetes Survival Skills Education) will improve Outcome: Progressing Goal: Individualized Educational Video(s) Outcome: Progressing   Problem: Coping: Goal: Ability to adjust to condition or change in health will improve Outcome: Progressing   Problem: Health Behavior/Discharge Planning: Goal: Ability to identify and utilize available resources and services will improve Outcome: Progressing Goal: Ability to manage health-related needs will improve Outcome: Progressing   Problem: Nutritional: Goal: Progress toward achieving an optimal weight will improve Outcome: Progressing   Problem: Skin Integrity: Goal: Risk for impaired skin integrity will decrease Outcome: Progressing   Problem: Tissue Perfusion: Goal: Adequacy of tissue perfusion will improve Outcome: Progressing   Problem: Education: Goal: Knowledge of General Education information will improve Description: Including pain rating scale, medication(s)/side effects and non-pharmacologic comfort measures Outcome: Progressing   Problem: Health Behavior/Discharge Planning: Goal: Ability to manage health-related needs will improve Outcome: Progressing   Problem: Clinical  Measurements: Goal: Ability to maintain clinical measurements within normal limits will improve Outcome: Progressing Goal: Will remain free from infection Outcome: Progressing Goal: Diagnostic test results will improve Outcome: Progressing   Problem: Activity: Goal: Risk for activity intolerance will decrease Outcome: Progressing   Problem: Nutrition: Goal: Adequate nutrition will be maintained Outcome: Progressing   Problem: Coping: Goal: Level of anxiety will decrease Outcome: Progressing   Problem: Elimination: Goal: Will not experience complications related to bowel motility Outcome: Progressing Goal: Will not experience complications related to urinary retention Outcome: Progressing   Problem: Pain Managment: Goal: General experience of comfort will improve Outcome: Progressing   Problem: Safety: Goal: Ability to remain free from injury will improve Outcome: Progressing   Problem: Skin Integrity: Goal: Risk for impaired skin integrity will decrease Outcome: Progressing   Problem: Education: Goal: Ability to demonstrate management of disease process will improve Outcome: Progressing Goal: Ability to verbalize understanding of medication therapies will improve Outcome: Progressing Goal: Individualized Educational Video(s) Outcome: Progressing   Problem: Activity: Goal: Capacity to carry out activities will improve Outcome: Progressing   Problem: Cardiac: Goal: Ability to achieve and maintain adequate cardiopulmonary perfusion will improve Outcome: Progressing   Problem: Activity: Goal: Capacity to carry out activities will improve Outcome: Not Progressing   Problem: Activity: Goal: Ability to tolerate increased activity will improve Outcome: Not Progressing   Problem: Fluid Volume: Goal: Ability to maintain a balanced intake and output will improve Outcome: Not Progressing   Problem: Metabolic: Goal: Ability to maintain appropriate glucose levels  will improve Outcome: Not Progressing   Problem: Nutritional: Goal: Maintenance of adequate nutrition will improve Outcome: Not Progressing   Problem: Clinical Measurements: Goal: Respiratory complications will improve Outcome: Not Progressing Goal: Cardiovascular complication will be avoided Outcome: Not Progressing

## 2023-06-28 NOTE — Plan of Care (Signed)
  Problem: Education: Goal: Ability to demonstrate management of disease process will improve Outcome: Progressing   Problem: Education: Goal: Ability to demonstrate management of disease process will improve Outcome: Progressing   Problem: Activity: Goal: Capacity to carry out activities will improve Outcome: Progressing   Problem: Fluid Volume: Goal: Ability to maintain a balanced intake and output will improve Outcome: Progressing

## 2023-06-28 NOTE — Evaluation (Signed)
Clinical/Bedside Swallow Evaluation Patient Details  Name: [name] MRN: [id number] Date of Birth: 22-Dec-1960  Today's Date: 06/28/2023 Time: SLP Start Time (ACUTE ONLY): 1755 SLP Stop Time (ACUTE ONLY): 1823 SLP Time Calculation (min) (ACUTE ONLY): 28 min  Past Medical History:  Past Medical History:  Diagnosis Date   Anemia    Arthritis    Asthma    Cancer of sigmoid (HCC)    COPD (chronic obstructive pulmonary disease) (HCC)    Depression    Diabetes mellitus    Diastolic dysfunction 05/15/2015   Dyspnea    Generalized weakness    Hyperlipidemia    Hypertension    Hypothyroidism    Obesity    Palpitations    Pneumonia    PONV (postoperative nausea and vomiting)    Prolonged QT interval 05/14/2015   Sleep apnea    Past Surgical History:  Past Surgical History:  Procedure Laterality Date   BIOPSY  04/06/2022   Procedure: BIOPSY;  Surgeon: Dolores Frame, MD;  [facility]: [facility];  Service: Gastroenterology;;   CATARACT EXTRACTION     CESAREAN SECTION     CHOLECYSTECTOMY     COLONOSCOPY WITH PROPOFOL N/A 04/06/2022   Procedure: COLONOSCOPY WITH PROPOFOL;  Surgeon: Dolores Frame, MD;  Location: [facility];  Service: Gastroenterology;  Laterality: N/A;  805 ASA 2 patient in [facility]   ESOPHAGOGASTRODUODENOSCOPY (EGD) WITH PROPOFOL N/A 04/06/2022   Procedure: ESOPHAGOGASTRODUODENOSCOPY (EGD) WITH PROPOFOL;  Surgeon: Dolores Frame, MD;  [facility]: [facility];  Service: Gastroenterology;  Laterality: N/A;   HEMOSTASIS CLIP PLACEMENT  04/06/2022   Procedure: HEMOSTASIS CLIP PLACEMENT;  Surgeon: Dolores Frame, MD;  Location: [facility];  Service: Gastroenterology;;   POLYPECTOMY  04/06/2022   Procedure: POLYPECTOMY;  Surgeon: Marguerita Merles, Reuel Boom, MD;  Location: [facility];  Service: Gastroenterology;;   SUBMUCOSAL TATTOO INJECTION  04/06/2022   Procedure: SUBMUCOSAL TATTOO INJECTION;   Surgeon: Marguerita Merles, Reuel Boom, MD;  Location: [facility];  Service: Gastroenterology;;   HPI:  [name] is a 62 y.o. 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  Patient's symptoms have been present since Friday (06/24/2023) and has been worsening throughout.  Initial chest x-ray and COVID test as an outpatient failed to demonstrate acute abnormalities.  Given further deterioration patient has been transferred to the emergency department for further treatment.  Workup demonstrating vascular congestion and new bilateral infiltrates suggestive of pneumonia along with acute on chronic heart failure.  Patient require the use of BiPAP to stabilize oxygen saturation and ease of respiratory distress.  Patient has confirmed to be DNR and to not one intubation if required.  BSE requested.    Assessment / Plan / Recommendation  Clinical Impression  Clinical swallow evaluation reveals WNL oral motor function and no gross facial asymmetry. Pt is short of breath and difficult to understand at times due to respiratory compromise. She was positioned as upright as possible, but difficult due to increased body habitus. Ideally, Pt should sit up in a chair for meals (she says she eats in her wheelchair at [facility]). Pt with occsasional delayed cough, however has been coughing througout the day. Suspect Pt is at risk for aspiration due to respiratory  compromise and difficulty with respiration/swallow reciprocity. Pt was encouraged to take small sips/bites, chew thoroughly, and swallow 2x for each bite/sip and remain upright after eating for at least 30 minutes, ideally up in chair. Given  chest CT, can complete MBSS if desired. SLP will follow tomorrow. Continue diet as ordered. SLP Visit Diagnosis: Dysphagia, unspecified (R13.10)    Aspiration Risk  Mild aspiration risk    Diet Recommendation Regular;Thin liquid    Liquid Administration via: Cup;Straw Medication Administration: Whole meds with puree Supervision: Patient able to self feed;Intermittent supervision to cue for compensatory strategies Compensations: Slow rate;Small sips/bites;Multiple dry swallows after each bite/sip Postural Changes: Seated upright at 90 degrees;Remain upright for at least 30 minutes after po intake    Other  Recommendations Oral Care Recommendations: Oral care BID    Recommendations for follow up therapy are one component of a multi-disciplinary discharge planning process, led by the attending physician.  Recommendations may be updated based on patient status, additional functional criteria and insurance authorization.  Follow up Recommendations Follow physician's recommendations for discharge plan and follow up therapies      Assistance Recommended at Discharge    Functional Status Assessment Patient has had a recent decline in their functional status and demonstrates the ability to make significant improvements in function in a reasonable and predictable amount of time.  Frequency and Duration min 2x/week  1 week       Prognosis Prognosis for improved oropharyngeal function: Fair Barriers to Reach Goals:  (respiratory compromise)      Swallow Study   General Date of Onset: 06/27/23 HPI: [name] is a 62 y.o. 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   Patient's symptoms have been present since Friday (06/24/2023) and has been worsening throughout.  Initial chest x-ray and COVID test as an outpatient failed to demonstrate acute abnormalities.  Given further deterioration patient has been transferred to the emergency department for further treatment.  Workup demonstrating vascular congestion and new bilateral infiltrates suggestive of pneumonia along with acute on chronic heart failure.  Patient require the use of BiPAP to stabilize oxygen saturation and ease of respiratory distress.  Patient has confirmed to be DNR and to not one intubation if required.  BSE requested. Type of Study: Bedside Swallow Evaluation Previous Swallow Assessment: N/A Diet Prior to this Study: Regular;Thin liquids (Level 0) Temperature Spikes Noted: No Respiratory Status: Nasal cannula History of Recent Intubation: No Behavior/Cognition: Alert;Cooperative;Pleasant mood Oral Cavity Assessment: Within Functional Limits Oral Care Completed by SLP: Recent completion by staff Oral Cavity - Dentition: Adequate natural dentition Vision: Functional for self-feeding Self-Feeding Abilities: Needs set up Patient Positioning: Upright in bed Baseline Vocal Quality: Normal Volitional Cough: Congested;Strong Volitional Swallow: Able to elicit    Oral/Motor/Sensory Function Overall Oral Motor/Sensory Function: Within functional limits   Ice Chips Ice chips: Not tested   Thin Liquid Thin Liquid: Impaired Presentation: Cup;Straw;Self Fed Pharyngeal  Phase Impairments: Cough - Delayed    Nectar Thick Nectar Thick Liquid: Not tested   Honey Thick Honey Thick Liquid: Not tested   Puree Puree: Within functional limits Presentation: Spoon   Solid     Solid: Within functional limits Presentation: Self Fed Other Comments: Pt expectorated the meat due to reported distaste, but consumed  the dinner roll     Thank you,  Havery Moros,  CCC-SLP [phone]  Estoria Geary 06/28/2023,6:28 PM

## 2023-06-28 NOTE — Progress Notes (Signed)
Progress Note   Patient: [name] YQI:[id number] DOB: Apr 24, 1961 DOA: 06/27/2023     1 DOS: the patient was seen and examined on 06/28/2023   Brief hospital admission narrative: [name] is a 62 y.o. 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  Patient's symptoms have been present since Friday (06/24/2023) and has been worsening throughout.  Initial chest x-ray and COVID test as an outpatient failed to demonstrate acute abnormalities.  Given further deterioration patient has been transferred to the emergency department for further treatment.  Workup demonstrating vascular congestion and new bilateral infiltrates suggestive of pneumonia along with acute on chronic heart failure. Patient require the use of BiPAP to stabilize oxygen saturation and ease of respiratory distress.  Patient has confirmed to be DNR and to not one intubation if required.   In the ED cultures taken, antibiotics given and TRH consulted to place patient in the hospital for further evaluation and management.   Of note, patient denies abdominal pain, chest pain, nausea, vomiting, dysuria, hematuria, melena, hematochezia or focal weaknesses.  Assessment and Plan: * Acute respiratory failure with hypoxia (HCC) -Patient did not use oxygen at baseline -Acute hypoxemic respiratory failure appears to be multifactorial in the setting of COPD exacerbation, CHF exacerbation and multifocal pneumonia.   -Blood cultures positive for haemophilus influenza. -Will continue IV antibiotics using Unasyn per protocol; continue steroids, bronchodilator management and mucolytic's. -Flutter valve and incentive  spirometer provided. -Will continue BiPAP nightly (patient body habitus and prior history suggesting presence of obesity hypoventilation syndrome-most likely OSA) and continue to wean off oxygen supplementation as tolerated. -Continue daily weights, strict I's and O's and IV diuresis -Follow clinical response. -Patient has confirmed DNR.   Acute on chronic diastolic CHF (congestive heart failure) (HCC) -Patient with vascular congestion appreciated on x-ray and an elevated BNP -follow 2-D echo -continue IV diuresis -Strict I's and O's, daily weight and low-sodium diet to be followed. -Continue beta-blocker.  GERD (gastroesophageal reflux disease) -Continue PPI.  Mixed hyperlipidemia -Continue statin  Morbid obesity with BMI of 50.0-59.9, adult (HCC) -Body mass index is 54.11 kg/m. -Low-calorie diet, portion control and increase physical activity discussed with patient.  OSA (obstructive sleep apnea) -Continue the use of BiPAP nightly.  Acute renal failure superimposed on chronic kidney disease (HCC) -Patient creatinine higher than baseline and what appears to be decreased perfusion due to acute on chronic heart failure. -Avoid hypotension, minimize/avoid the use of contrast and closely follow renal function while diuresing patient -Continue strict I's and O's.  Prolonged QT interval -Continue telemetry monitoring. -replete electrolytes as needed and minimize the use of medications that can further prolong QT.  Depression -Stable mood/flat affect -Continue home antidepressant management. -No hallucinations and no suicidal ideation currently.  COPD (chronic obstructive pulmonary disease) (HCC) -With acute exacerbation as already mentioned -Continue current antibiotics, bronchodilator management, steroids and oxygen supplementation. -Flutter valve and incentive spirometer has been ordered and encouraged with patient.  Essential hypertension -Stable for the most  part -Continue current antihypertensive agents -Continue to follow vital signs. -Heart healthy diet ordered.  Diabetes mellitus (HCC) -A1c 8.5 -Continue sliding scale insulin and the use of Semglee -Follow CBGs fluctuation and adjust hypoglycemic regimen as needed -Anticipate increase in blood sugar levels due to the use  of steroids -Continue modified carbohydrate diet   Subjective:  Afebrile currently; no chest pain, no nausea or vomiting.  Reports feeling better and breathing easier.  6-7 L of high flow nasal cannula supplementation in place.  Following commands appropriately.  Physical Exam: Vitals:   06/28/23 1505 06/28/23 1551 06/28/23 1733 06/28/23 1734  BP:   (!) 144/53   Pulse:    (!) 102  Resp:    (!) 23  Temp:  98.2 F (36.8 C)    TempSrc:  Oral    SpO2: 97%   97%  Weight:      Height:       General exam: Alert, awake, oriented x 3; reporting feeling better and currently off BiPAP requiring 6-7 L high flow nasal cannula supplementation.  No fever. Respiratory system: Positive expiratory wheezing; decreased breath sounds at the bases and poor air movement.  Bilateral rhonchi appreciated. Cardiovascular system:RRR. No rubs or gallops; unable to assess JVD with body habitus. Gastrointestinal system: Abdomen is obese, nondistended, soft and nontender. No organomegaly or masses felt. Normal bowel sounds heard. Central nervous system: Moving 4 limbs spontaneously no focal neurological deficits. Extremities: No cyanosis or clubbing; 2+ edema appreciated bilaterally. Skin: No petechiae. Psychiatry: Judgement and insight appear normal. Mood & affect appropriate.   Data Reviewed: Basic metabolic panel: Sodium 137, potassium 4.0, chloride 99, bicarb 22, BUN 57, creatinine 2.37 and GFR 23. CBC: White blood cells 14.2, hemoglobin 12.1 and platelet count 235 K  Family Communication: Spouse at bedside.  Disposition: Status is: Inpatient Remains inpatient appropriate because:  Continue treatment with IV antibiotics, steroids, bronchodilator management and IV diuretics.   Planned Discharge Destination: Skilled nursing facility   Time spent: 55 minutes  Author: Vassie Loll, MD 06/28/2023 5:46 PM  For on call review [email or web address].

## 2023-06-29 DIAGNOSIS — J9601 Acute respiratory failure with hypoxia: Secondary | ICD-10-CM | POA: Diagnosis not present

## 2023-06-29 LAB — LEGIONELLA PNEUMOPHILA SEROGP 1 UR AG: L. pneumophila Serogp 1 Ur Ag: NEGATIVE

## 2023-06-29 LAB — GLUCOSE, CAPILLARY
Glucose-Capillary: 415 mg/dL — ABNORMAL HIGH (ref 70–99)
Glucose-Capillary: 410 mg/dL — ABNORMAL HIGH (ref 70–99)
Glucose-Capillary: 342 mg/dL — ABNORMAL HIGH (ref 70–99)
Glucose-Capillary: 199 mg/dL — ABNORMAL HIGH (ref 70–99)

## 2023-06-29 LAB — BASIC METABOLIC PANEL
Anion gap: 14 (ref 5–15)
BUN: 64 mg/dL — ABNORMAL HIGH (ref 8–23)
CO2: 25 mmol/L (ref 22–32)
Calcium: 9.2 mg/dL (ref 8.9–10.3)
Chloride: 100 mmol/L (ref 98–111)
Creatinine, Ser: 2.15 mg/dL — ABNORMAL HIGH (ref 0.44–1.00)
GFR, Estimated: 25 mL/min — ABNORMAL LOW (ref >60)
Glucose, Bld: 417 mg/dL — ABNORMAL HIGH (ref 70–99)
Potassium: 4.1 mmol/L (ref 3.5–5.1)
Sodium: 139 mmol/L (ref 135–145)

## 2023-06-29 LAB — CULTURE, BLOOD (ROUTINE X 2): Special Requests: ADEQUATE

## 2023-06-29 LAB — BLOOD GAS, ARTERIAL
Acid-Base Excess: 7.5 mmol/L — ABNORMAL HIGH (ref 0.0–2.0)
Bicarbonate: 33.6 mmol/L — ABNORMAL HIGH (ref 20.0–28.0)
Drawn by: 41977
O2 Saturation: 93.5 %
Patient temperature: 37
pCO2 arterial: 53 mmHg — ABNORMAL HIGH (ref 32–48)
pH, Arterial: 7.41 (ref 7.35–7.45)
pO2, Arterial: 65 mmHg — ABNORMAL LOW (ref 83–108)

## 2023-06-29 MED ORDER — INSULIN ASPART 100 UNIT/ML IJ SOLN
0.0000 [IU] | Freq: Three times a day (TID) | INTRAMUSCULAR | Status: DC
  Administered 2023-06-29: 15 [IU] via SUBCUTANEOUS
  Administered 2023-06-30: 4 [IU] via SUBCUTANEOUS
  Administered 2023-07-01: 20 [IU] via SUBCUTANEOUS
  Administered 2023-07-02: 4 [IU] via SUBCUTANEOUS

## 2023-06-29 MED ORDER — INSULIN ASPART 100 UNIT/ML IJ SOLN
30.0000 [IU] | Freq: Once | INTRAMUSCULAR | Status: AC
  Administered 2023-06-29: 30 [IU] via SUBCUTANEOUS

## 2023-06-29 MED ORDER — INSULIN ASPART 100 UNIT/ML IJ SOLN
4.0000 [IU] | Freq: Three times a day (TID) | INTRAMUSCULAR | Status: DC
  Administered 2023-06-29 – 2023-07-02 (×5): 4 [IU] via SUBCUTANEOUS

## 2023-06-29 MED ORDER — INSULIN ASPART 100 UNIT/ML IJ SOLN
28.0000 [IU] | Freq: Once | INTRAMUSCULAR | Status: AC
  Administered 2023-06-29: 28 [IU] via SUBCUTANEOUS

## 2023-06-29 MED ORDER — INSULIN GLARGINE-YFGN 100 UNIT/ML ~~LOC~~ SOLN
12.0000 [IU] | Freq: Two times a day (BID) | SUBCUTANEOUS | Status: DC
  Administered 2023-06-29: 12 [IU] via SUBCUTANEOUS
  Filled 2023-06-29 (×3): qty 0.12

## 2023-06-29 MED ORDER — INSULIN GLARGINE-YFGN 100 UNIT/ML ~~LOC~~ SOLN
15.0000 [IU] | Freq: Two times a day (BID) | SUBCUTANEOUS | Status: DC
  Administered 2023-06-29: 15 [IU] via SUBCUTANEOUS
  Filled 2023-06-29 (×4): qty 0.15

## 2023-06-29 NOTE — Progress Notes (Signed)
Date and time results received: 06/29/23  @11 : 20am   Test: CBG Critical Value: 410 Name of Provider Notified: Dr. Marisa Severin @ 11:25am  Orders Received? See new orders @ 11:50am

## 2023-06-29 NOTE — TOC Progression Note (Signed)
Transition of Care [facility]) - Progression Note    Patient Details  Name: [name] MRN: [id number] Date of Birth: 12/26/1960  [facility] CM/SW Contact  Karn Cassis, [place] Phone Number: 06/29/2023, 10:19 AM  Clinical Narrative:  LCSW updated Heather at [facility] on pt. Possible d/c this weekend per MD. [name] aware and agreeable.       Expected Discharge Plan: Skilled Nursing Facility Barriers to Discharge: Continued Medical Work up  Expected Discharge Plan and Services In-house Referral: Clinical Social Work   Post Acute Care Choice: Resumption of Svcs/PTA Provider Living arrangements for the past 2 months: Skilled Nursing Facility                                       Social Determinants of Health (SDOH) Interventions SDOH Screenings   Food Insecurity: Patient Declined (08/12/2022)  Housing: Low Risk  (08/12/2022)  Transportation Needs: No Transportation Needs (08/12/2022)  Utilities: Not At Risk (08/12/2022)  Financial Resource Strain: Medium Risk (08/14/2021)   Received from [facility], [facility]  Physical Activity: Inactive (08/14/2021)   Received from [facility], [facility]  Social Connections: Unknown (04/08/2022)   Received from [facility], [facility]  Stress: Stress Concern Present (08/14/2021)   Received from [facility], [facility]  Tobacco Use: Low Risk  (06/14/2023)   Received from [facility]  Recent Concern: Tobacco Use - Medium Risk (03/21/2023)    Readmission Risk Interventions    06/27/2023    2:47 PM 12/17/2021   11:29 AM 11/23/2021    2:32 PM  Readmission Risk Prevention Plan  Transportation Screening Complete Complete Complete  Home Care Screening  Complete   Medication Review (RN CM)  Complete Complete  HRI or Home Care Consult Complete    Social Work Consult for Recovery Care Planning/Counseling Complete    Palliative Care Screening Not Applicable    Medication Review [employer])  Complete

## 2023-06-29 NOTE — Progress Notes (Signed)
Date and time results received: 06/29/23 7:53am  Test: CBG Critical Value: 415  Name of Provider Notified: Dr. Marisa Severin  @7 : 58 am  Orders Received? See new orders @ 8:09am

## 2023-06-29 NOTE — Plan of Care (Signed)
  Problem: Education: Goal: Ability to demonstrate management of disease process will improve Outcome: Progressing Goal: Ability to verbalize understanding of medication therapies will improve Outcome: Progressing Goal: Individualized Educational Video(s) Outcome: Progressing   Problem: Activity: Goal: Capacity to carry out activities will improve Outcome: Progressing   Problem: Cardiac: Goal: Ability to achieve and maintain adequate cardiopulmonary perfusion will improve Outcome: Progressing   Problem: Activity: Goal: Ability to tolerate increased activity will improve Outcome: Progressing   Problem: Clinical Measurements: Goal: Ability to maintain a body temperature in the normal range will improve Outcome: Progressing   Problem: Respiratory: Goal: Ability to maintain adequate ventilation will improve Outcome: Progressing Goal: Ability to maintain a clear airway will improve Outcome: Progressing   Problem: Education: Goal: Ability to describe self-care measures that may prevent or decrease complications (Diabetes Survival Skills Education) will improve Outcome: Progressing Goal: Individualized Educational Video(s) Outcome: Progressing   Problem: Coping: Goal: Ability to adjust to condition or change in health will improve Outcome: Progressing   Problem: Fluid Volume: Goal: Ability to maintain a balanced intake and output will improve Outcome: Progressing   Problem: Health Behavior/Discharge Planning: Goal: Ability to identify and utilize available resources and services will improve Outcome: Progressing Goal: Ability to manage health-related needs will improve Outcome: Progressing   Problem: Metabolic: Goal: Ability to maintain appropriate glucose levels will improve Outcome: Progressing   Problem: Nutritional: Goal: Maintenance of adequate nutrition will improve Outcome: Progressing   Problem: Skin Integrity: Goal: Risk for impaired skin integrity will  decrease Outcome: Progressing   Problem: Tissue Perfusion: Goal: Adequacy of tissue perfusion will improve Outcome: Progressing   Problem: Education: Goal: Knowledge of General Education information will improve Description: Including pain rating scale, medication(s)/side effects and non-pharmacologic comfort measures Outcome: Progressing   Problem: Health Behavior/Discharge Planning: Goal: Ability to manage health-related needs will improve Outcome: Progressing   Problem: Clinical Measurements: Goal: Ability to maintain clinical measurements within normal limits will improve Outcome: Progressing Goal: Will remain free from infection Outcome: Progressing Goal: Diagnostic test results will improve Outcome: Progressing Goal: Respiratory complications will improve Outcome: Progressing Goal: Cardiovascular complication will be avoided Outcome: Progressing   Problem: Activity: Goal: Risk for activity intolerance will decrease Outcome: Progressing   Problem: Nutrition: Goal: Adequate nutrition will be maintained Outcome: Progressing   Problem: Coping: Goal: Level of anxiety will decrease Outcome: Progressing   Problem: Elimination: Goal: Will not experience complications related to bowel motility Outcome: Progressing Goal: Will not experience complications related to urinary retention Outcome: Progressing   Problem: Pain Managment: Goal: General experience of comfort will improve Outcome: Progressing   Problem: Safety: Goal: Ability to remain free from injury will improve Outcome: Progressing   Problem: Skin Integrity: Goal: Risk for impaired skin integrity will decrease Outcome: Progressing   Problem: Education: Goal: Ability to demonstrate management of disease process will improve Outcome: Progressing Goal: Ability to verbalize understanding of medication therapies will improve Outcome: Progressing Goal: Individualized Educational Video(s) Outcome:  Progressing   Problem: Activity: Goal: Capacity to carry out activities will improve Outcome: Progressing   Problem: Cardiac: Goal: Ability to achieve and maintain adequate cardiopulmonary perfusion will improve Outcome: Progressing   Problem: Nutritional: Goal: Progress toward achieving an optimal weight will improve Outcome: Not Progressing

## 2023-06-29 NOTE — Plan of Care (Signed)
  Problem: Education: Goal: Ability to demonstrate management of disease process will improve Outcome: Progressing Goal: Ability to verbalize understanding of medication therapies will improve Outcome: Progressing Goal: Individualized Educational Video(s) Outcome: Progressing   Problem: Activity: Goal: Capacity to carry out activities will improve Outcome: Progressing   Problem: Education: Goal: Ability to demonstrate management of disease process will improve Outcome: Progressing Goal: Ability to verbalize understanding of medication therapies will improve Outcome: Progressing Goal: Individualized Educational Video(s) Outcome: Progressing   Problem: Activity: Goal: Capacity to carry out activities will improve Outcome: Progressing   Problem: Cardiac: Goal: Ability to achieve and maintain adequate cardiopulmonary perfusion will improve Outcome: Progressing   Problem: Activity: Goal: Ability to tolerate increased activity will improve Outcome: Progressing   Problem: Clinical Measurements: Goal: Ability to maintain a body temperature in the normal range will improve Outcome: Progressing   Problem: Respiratory: Goal: Ability to maintain adequate ventilation will improve Outcome: Progressing Goal: Ability to maintain a clear airway will improve Outcome: Progressing   Problem: Education: Goal: Ability to describe self-care measures that may prevent or decrease complications (Diabetes Survival Skills Education) will improve Outcome: Progressing Goal: Individualized Educational Video(s) Outcome: Progressing   Problem: Coping: Goal: Ability to adjust to condition or change in health will improve Outcome: Progressing   Problem: Fluid Volume: Goal: Ability to maintain a balanced intake and output will improve Outcome: Progressing   Problem: Health Behavior/Discharge Planning: Goal: Ability to identify and utilize available resources and services will improve Outcome:  Progressing Goal: Ability to manage health-related needs will improve Outcome: Progressing   Problem: Tissue Perfusion: Goal: Adequacy of tissue perfusion will improve Outcome: Progressing   Problem: Education: Goal: Knowledge of General Education information will improve Description: Including pain rating scale, medication(s)/side effects and non-pharmacologic comfort measures Outcome: Progressing   Problem: Clinical Measurements: Goal: Ability to maintain clinical measurements within normal limits will improve Outcome: Progressing Goal: Will remain free from infection Outcome: Progressing Goal: Diagnostic test results will improve Outcome: Progressing Goal: Respiratory complications will improve Outcome: Progressing Goal: Cardiovascular complication will be avoided Outcome: Progressing

## 2023-06-29 NOTE — Progress Notes (Signed)
SLP Cancellation Note  Patient Details Name: [name] MRN: [id number] DOB: 1961-06-05   Cancelled treatment:       Reason Eval/Treat Not Completed: Other (comment) (Pt back on Bi-PAP and will be indefinitely per RN. SLP will check back as schedule permits.)  Thank you,  Havery Moros, CCC-SLP [phone]  Benjermin Korber 06/29/2023, 1:24 PM

## 2023-06-29 NOTE — Progress Notes (Addendum)
Nurse and staff got pt. Up to chair to eat breakfast. Pt. Able to eat a couple of bites but due to labored breathing, and pt. Being lethargic pt. Not able to eat well. Pt. Sitting up in chair and is lethargic and weak. Pt. Put back in bed and placed back on Bipap with 50% FiO2 per respiratory. MD made aware.   Around 9:20am: MD at bedside to assess pt.

## 2023-06-29 NOTE — Inpatient Diabetes Management (Signed)
Inpatient Diabetes Program Recommendations  AACE/ADA: New Consensus Statement on Inpatient Glycemic Control   Target Ranges:  Prepandial:   less than 140 mg/dL      Peak postprandial:   less than 180 mg/dL (1-2 hours)      Critically ill patients:  140 - 180 mg/dL    Latest Reference Range & Units 06/28/23 05:44 06/28/23 08:36 06/28/23 11:16 06/28/23 13:28 06/28/23 15:30 06/28/23 18:04 06/28/23 20:40 06/29/23 07:51  Glucose-Capillary 70 - 99 mg/dL 528 (H) 413 (H) 244 (H) 368 (H) 331 (H) 338 (H) 274 (H) 415 (H)   Review of Glycemic Control  Diabetes history: DM2 Outpatient Diabetes medications: Lantus 35 units at bedtime, Novolog 10 units TID with meals if CBG over 150 mg/dl,  Current orders for Inpatient glycemic control: Semglee 12 units at bedtime, Novolog 0-15 units TID, Novolog 0-5 units at bedtime; Solumedrol 40 mg Q12H   Inpatient Diabetes Program Recommendations:     Insulin: CBGs 249-368 mg/dl on 0/10 and 272 mg/dl today.  If steroids are continued, please consider adding Semglee 10 units daily (continuing Semglee 12 units at bedtime) and ordering Novolog 10 units TID with meals for meal coverage if patient eats at least 50% of meals.   Thanks, Orlando Penner, RN, MSN, CDCES Diabetes Coordinator Inpatient Diabetes Program [phone] (Team Pager from 8am to 5pm)

## 2023-06-29 NOTE — Progress Notes (Signed)
Patient taken off of BiPAP and placed on 4L nasal cannula. Patient is tolerating well at this time. BiPAP remains at bedside when needed. RN aware.

## 2023-06-29 NOTE — Progress Notes (Signed)
Progress Note   Patient: [name] [id number]:[id number] DOB: 08/10/1961 DOA: 06/27/2023     2 DOS: the patient was seen and examined on 06/29/2023   Brief hospital admission narrative: [name] is a 62 y.o. 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  Patient's symptoms have been present since Friday (06/24/2023) and has been worsening throughout.  Initial chest x-ray and COVID test as an outpatient failed to demonstrate acute abnormalities.  Given further deterioration patient has been transferred to the emergency department for further treatment.  Workup demonstrating vascular congestion and new bilateral infiltrates suggestive of pneumonia along with acute on chronic heart failure. Patient require the use of BiPAP to stabilize oxygen saturation and ease of respiratory distress.  Patient has confirmed to be DNR and to not one intubation if required.   In the ED cultures taken, antibiotics given and TRH consulted to place patient in the hospital for further evaluation and management.   Of note, patient denies abdominal pain, chest pain, nausea, vomiting, dysuria, hematuria, melena, hematochezia or focal weaknesses.  Assessment and Plan: * Acute respiratory failure with hypoxia (HCC) -Patient did not use oxygen at baseline -Acute hypoxemic respiratory failure appears to be multifactorial in the setting of COPD exacerbation, CHF exacerbation and multifocal pneumonia.   -Blood cultures positive for haemophilus influenza. -Will continue IV antibiotics using Unasyn per protocol; continue steroids, bronchodilator management and mucolytic's. -Flutter valve and incentive  spirometer provided. -Will continue BiPAP nightly (patient body habitus and prior history suggesting presence of obesity hypoventilation syndrome-most likely OSA) and continue to wean off oxygen supplementation as tolerated. -Continue daily weights, strict I's and O's and IV diuresis -Follow clinical response. -Patient has confirmed DNR. 06/29/23 -Required BiPAP for prolonged periods of time today during the day\- -apparently did not use BiPAP last night   Acute on chronic diastolic CHF (congestive heart failure) (HCC) -Patient with vascular congestion appreciated on x-ray and an elevated BNP -follow 2-D echo -hold Lasix after today -Strict I's and O's, daily weight and low-sodium diet to be followed. -Continue beta-blocker. -BiPAP use as above  GERD (gastroesophageal reflux disease) -Continue PPI.  Mixed hyperlipidemia -Continue statin  Morbid obesity with BMI of 50.0-59.9, adult (HCC) -Body mass index is 54.11 kg/m. -Low-calorie diet, portion control and increase physical activity discussed with patient.  OSA (obstructive sleep apnea) -Continue the use of BiPAP nightly.  Acute renal failure superimposed on chronic kidney disease (HCC) -Patient creatinine higher than baseline and what appears to be decreased perfusion due to acute on chronic heart failure. -Avoid hypotension, minimize/avoid the use of contrast and closely follow renal function while diuresing patient -Continue strict I's and O's.  Prolonged QT interval -Continue telemetry monitoring. -replete electrolytes as needed and minimize the use of medications that can further prolong QT.  Depression -Stable mood/flat affect -Continue home antidepressant management. -No hallucinations and no suicidal ideation currently.  COPD (chronic obstructive pulmonary disease) (HCC) -With acute exacerbation as already mentioned -Continue current antibiotics, bronchodilator management, steroids and oxygen  supplementation. -Flutter valve and incentive spirometer has been ordered and encouraged with patient.  Essential hypertension -Stable for the most part -Continue current antihypertensive agents -Continue to follow vital signs. -Heart healthy diet ordered.  Diabetes mellitus (HCC) -A1c 8.5 -Continue sliding scale insulin  and the use of Semglee -Follow CBGs fluctuation and adjust hypoglycemic regimen as needed -Anticipate increase in blood sugar levels due to the use of steroids -Continue modified carbohydrate diet   Subjective:  -Sleepy but arousable -Tolerating BiPAP well -Poor oral intake No fever  Or chills   Physical Exam: Vitals:   06/29/23 1800 06/29/23 1828 06/29/23 1830 06/29/23 1900  BP: (!) 146/36 (!) 146/36 (!) 151/53 (!) 169/56  Pulse: 70 70 70 73  Resp: (!) 22 18 (!) 21 (!) 22  Temp:      TempSrc:      SpO2: 98% 98% 98% 98%  Weight:      Height:        Physical Exam  Gen:-Lethargic but arousable to verbal and tactile stimuli  HEENT:- [place]ite.AT, No sclera icterus Nose-BiPAP alternating with nasal cannula Neck-Supple Neck,No JVD,.  Lungs-diminished sounds, no wheezing CV- S1, S2 normal, RRR Abd-  +ve B.Sounds, Abd Soft, No tenderness, increased truncal adiposity Extremity/Skin:- No  edema,   good pedal pulses  Psych-affect is appropriate, oriented x3 Neuro-no new focal deficits, no tremors   Family Communication: Spouse at bedside.  Disposition: Most likely SNF rehab Status is: Inpatient Remains inpatient appropriate because: Continue treatment with IV antibiotics, steroids, bronchodilator management and IV diuretics.   Planned Discharge Destination: Skilled nursing facility   Author: Shon Hale, MD 06/29/2023 7:12 PM  For on call review [email or web address].

## 2023-06-30 DIAGNOSIS — J9601 Acute respiratory failure with hypoxia: Secondary | ICD-10-CM | POA: Diagnosis not present

## 2023-06-30 LAB — GLUCOSE, CAPILLARY
Glucose-Capillary: 471 mg/dL — ABNORMAL HIGH (ref 70–99)
Glucose-Capillary: 564 mg/dL (ref 70–99)
Glucose-Capillary: >600 mg/dL (ref 70–99)
Glucose-Capillary: 539 mg/dL (ref 70–99)
Glucose-Capillary: >600 mg/dL (ref 70–99)
Glucose-Capillary: 520 mg/dL (ref 70–99)
Glucose-Capillary: 533 mg/dL (ref 70–99)
Glucose-Capillary: 449 mg/dL — ABNORMAL HIGH (ref 70–99)
Glucose-Capillary: 350 mg/dL — ABNORMAL HIGH (ref 70–99)

## 2023-06-30 LAB — COMPREHENSIVE METABOLIC PANEL
ALT: 18 U/L (ref 0–44)
Albumin: 2.3 g/dL — ABNORMAL LOW (ref 3.5–5.0)
Alkaline Phosphatase: 108 U/L (ref 38–126)
Anion gap: 14 (ref 5–15)
Calcium: 9.3 mg/dL (ref 8.9–10.3)
Chloride: 101 mmol/L (ref 98–111)
Creatinine, Ser: 2.08 mg/dL — ABNORMAL HIGH (ref 0.44–1.00)
GFR, Estimated: 26 mL/min — ABNORMAL LOW (ref >60)
Potassium: 3.2 mmol/L — ABNORMAL LOW (ref 3.5–5.1)
Sodium: 141 mmol/L (ref 135–145)
Total Bilirubin: 1.3 mg/dL — ABNORMAL HIGH (ref 0.3–1.2)
Total Protein: 6.6 g/dL (ref 6.5–8.1)

## 2023-06-30 LAB — CULTURE, BLOOD (ROUTINE X 2)

## 2023-06-30 LAB — CBC
Hemoglobin: 12 g/dL (ref 12.0–15.0)
MCH: 27.9 pg (ref 26.0–34.0)
MCHC: 33.1 g/dL (ref 30.0–36.0)
MCV: 84.4 fL (ref 80.0–100.0)
Platelets: 267 10*3/uL (ref 150–400)
RBC: 4.3 MIL/uL (ref 3.87–5.11)
RDW: 15 % (ref 11.5–15.5)
WBC: 27.6 10*3/uL — ABNORMAL HIGH (ref 4.0–10.5)

## 2023-06-30 LAB — AMMONIA: Ammonia: 18 umol/L (ref 9–35)

## 2023-06-30 MED ORDER — INSULIN GLARGINE-YFGN 100 UNIT/ML ~~LOC~~ SOLN
18.0000 [IU] | Freq: Two times a day (BID) | SUBCUTANEOUS | Status: DC
  Administered 2023-06-30: 18 [IU] via SUBCUTANEOUS
  Filled 2023-06-30 (×3): qty 0.18

## 2023-06-30 MED ORDER — INSULIN ASPART 100 UNIT/ML IJ SOLN
30.0000 [IU] | Freq: Once | INTRAMUSCULAR | Status: AC
  Administered 2023-06-30: 30 [IU] via SUBCUTANEOUS

## 2023-06-30 MED ORDER — LABETALOL HCL 5 MG/ML IV SOLN: 10.0000 mg | INTRAVENOUS | Status: DC | PRN

## 2023-06-30 MED ORDER — INSULIN ASPART 100 UNIT/ML IJ SOLN
44.0000 [IU] | Freq: Once | INTRAMUSCULAR | Status: AC
  Administered 2023-06-30: 44 [IU] via SUBCUTANEOUS

## 2023-06-30 MED ORDER — ORAL CARE MOUTH RINSE: 15.0000 mL | OROMUCOSAL | Status: DC | PRN

## 2023-06-30 MED ORDER — INSULIN GLARGINE-YFGN 100 UNIT/ML ~~LOC~~ SOLN
22.0000 [IU] | Freq: Two times a day (BID) | SUBCUTANEOUS | Status: DC
  Filled 2023-06-30 (×2): qty 0.22

## 2023-06-30 MED ORDER — POTASSIUM CHLORIDE 10 MEQ/100ML IV SOLN
10.0000 meq | INTRAVENOUS | Status: AC
  Administered 2023-06-30 (×4): 10 meq via INTRAVENOUS
  Filled 2023-06-30 (×4): qty 100

## 2023-06-30 MED ORDER — INSULIN GLARGINE-YFGN 100 UNIT/ML ~~LOC~~ SOLN
10.0000 [IU] | Freq: Once | SUBCUTANEOUS | Status: AC
  Administered 2023-06-30: 10 [IU] via SUBCUTANEOUS
  Filled 2023-06-30: qty 0.1

## 2023-06-30 MED ORDER — INSULIN GLARGINE-YFGN 100 UNIT/ML ~~LOC~~ SOLN
30.0000 [IU] | Freq: Two times a day (BID) | SUBCUTANEOUS | Status: DC
  Administered 2023-06-30 – 2023-07-01 (×2): 30 [IU] via SUBCUTANEOUS
  Filled 2023-06-30 (×4): qty 0.3

## 2023-06-30 MED ORDER — PREDNISONE 20 MG PO TABS
40.0000 mg | ORAL_TABLET | Freq: Every day | ORAL | Status: DC
  Administered 2023-07-01 – 2023-07-02 (×2): 40 mg via ORAL
  Filled 2023-06-30 (×2): qty 2

## 2023-06-30 MED ORDER — INSULIN GLARGINE-YFGN 100 UNIT/ML ~~LOC~~ SOLN
26.0000 [IU] | Freq: Two times a day (BID) | SUBCUTANEOUS | Status: DC
  Filled 2023-06-30 (×2): qty 0.26

## 2023-06-30 MED ORDER — POTASSIUM CHLORIDE CRYS ER 20 MEQ PO TBCR
40.0000 meq | EXTENDED_RELEASE_TABLET | Freq: Once | ORAL | Status: AC
  Administered 2023-06-30: 40 meq via ORAL
  Filled 2023-06-30: qty 2

## 2023-06-30 MED ORDER — INSULIN ASPART 100 UNIT/ML IJ SOLN
38.0000 [IU] | Freq: Once | INTRAMUSCULAR | Status: AC
  Administered 2023-06-30: 38 [IU] via SUBCUTANEOUS

## 2023-06-30 NOTE — Plan of Care (Signed)
  Problem: Acute Rehab PT Goals(only PT should resolve) Goal: Pt Will Go Supine/Side To Sit Outcome: Progressing Flowsheets (Taken 06/30/2023 1529) Pt will go Supine/Side to Sit: with moderate assist Goal: Patient Will Transfer Sit To/From Stand Outcome: Progressing Flowsheets (Taken 06/30/2023 1529) Patient will transfer sit to/from stand: with moderate assist Goal: Pt Will Transfer Bed To Chair/Chair To Bed Outcome: Progressing Flowsheets (Taken 06/30/2023 1529) Pt will Transfer Bed to Chair/Chair to Bed: with mod assist Goal: Pt Will Ambulate Outcome: Progressing Flowsheets (Taken 06/30/2023 1529) Pt will Ambulate: 15 feet   Taija Mathias SPT [place], DPT Program

## 2023-06-30 NOTE — Plan of Care (Addendum)
  Problem: Education: Goal: Ability to demonstrate management of disease process will improve Outcome: Progressing   Problem: Activity: Goal: Capacity to carry out activities will improve Outcome: Progressing   Problem: Activity: Goal: Ability to tolerate increased activity will improve Outcome: Progressing   Problem: Respiratory: Goal: Ability to maintain adequate ventilation will improve Outcome: Progressing   Problem: Activity: Goal: Risk for activity intolerance will decrease Outcome: Progressing   Problem: Pain Managment: Goal: General experience of comfort will improve Outcome: Progressing   Problem: Activity: Goal: Capacity to carry out activities will improve Outcome: Progressing

## 2023-06-30 NOTE — Inpatient Diabetes Management (Signed)
Inpatient Diabetes Program Recommendations  AACE/ADA: New Consensus Statement on Inpatient Glycemic Control   Target Ranges:  Prepandial:   less than 140 mg/dL      Peak postprandial:   less than 180 mg/dL (1-2 hours)      Critically ill patients:  140 - 180 mg/dL    Latest Reference Range & Units 06/29/23 04:43 06/30/23 05:24  Glucose 70 - 99 mg/dL 638 (H) 756 (H)    Latest Reference Range & Units 06/29/23 07:51 06/29/23 11:17 06/29/23 15:55 06/29/23 22:31  Glucose-Capillary 70 - 99 mg/dL 433 (H)  Novolog 28 units  Semglee 12 units 410 (H)  Novolog 34 units 342 (H)  Novolog 19 units 199 (H)     Semglee 15 units   Review of Glycemic Control  Diabetes history: DM2 Outpatient Diabetes medications: Lantus 35 units at bedtime, Novolog 10 units TID with meals if CBG over 150 mg/dl,  Current orders for Inpatient glycemic control: Semglee 15 units BID, Novolog 0-20 units TID, Novolog 0-5 units at bedtime, Novolog 4 units TID with meals; Solumedrol 40 mg Q12H   Inpatient Diabetes Program Recommendations:     Insulin: CBGs 199-415 mg/dl on 2/95 and 188 mg/dl this morning.  If steroids are continued, please consider increasing Semglee to 20 units BID and increasing meal coverage to Novolog 10 units TID with meals if patient eats at least 50% of meals.  Thanks, Orlando Penner, RN, MSN, CDCES Diabetes Coordinator Inpatient Diabetes Program [phone] (Team Pager from 8am to 5pm)

## 2023-06-30 NOTE — Progress Notes (Signed)
Speech Language Pathology Treatment: Dysphagia  Patient Details Name: [name] MRN: [id number] DOB: 1961/09/08 Today's Date: 06/30/2023 Time: 9528-4132 SLP Time Calculation (min) (ACUTE ONLY): 14 min  Assessment / Plan / Recommendation Clinical Impression  Pt seen sitting up in chair and tells SLP, "I'm worn out". Pt consumed straw sips thin water with cues to take small sips, however she was thirsty and took sequential swallows. Pt with one delayed throat clear, no overt coughing. She presents with reduced breath support in speech tasks, but able to produce strong cough with cues. OK to continue diet as ordered with adherence to reflux and respiratory precautions. Can complete MBSS if physician desires, suspect episodic aspiration events with poor coordination of respiration and swallow.     HPI HPI: [name] is a 62 y.o. 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  Patient's symptoms have been present since Friday (06/24/2023) and has been worsening throughout.  Initial chest x-ray and COVID test as an outpatient failed to demonstrate acute abnormalities.  Given further deterioration patient has been transferred to the emergency department for further treatment.  Workup demonstrating vascular congestion and new bilateral infiltrates suggestive of pneumonia along with acute on chronic heart failure.  Patient require the use of BiPAP to stabilize oxygen saturation and ease of respiratory distress.  Patient has confirmed to be DNR and to not one intubation if required.  BSE requested.      SLP Plan  Continue with current plan of care      Recommendations for  follow up therapy are one component of a multi-disciplinary discharge planning process, led by the attending physician.  Recommendations may be updated based on patient status, additional functional criteria and insurance authorization.    Recommendations  Diet recommendations: Regular;Thin liquid Liquids provided via: Cup;Straw Medication Administration: Whole meds with puree Supervision: Patient able to self feed;Intermittent supervision to cue for compensatory strategies Compensations: Slow rate;Small sips/bites;Multiple dry swallows after each bite/sip Postural Changes and/or Swallow Maneuvers: Seated upright 90 degrees;Out of bed for meals;Upright 30-60 min after meal                  Oral care BID     Dysphagia, unspecified (R13.10)     Continue with current plan of care     [name]  06/30/2023, 3:02 PM

## 2023-06-30 NOTE — Evaluation (Signed)
Physical Therapy Evaluation Patient Details Name: [name] MRN: [id number] DOB: 1961-05-10 Today's Date: 06/30/2023  History of Present Illness  HPI: [name] is a 62 y.o. 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  Patient's symptoms have been present since Friday (06/24/2023) and has been worsening throughout.  Initial chest x-ray and COVID test as an outpatient failed to demonstrate acute abnormalities.  Given further deterioration patient has been transferred to the emergency department for further treatment.  Workup demonstrating vascular congestion and new bilateral infiltrates suggestive of pneumonia along with acute on chronic heart failure.  Patient require the use of BiPAP to stabilize oxygen saturation and ease of respiratory distress.  Patient has confirmed to be DNR and to not one intubation if required.  BSE requested.   Clinical Impression  Pt participation in session limited due to LE weakness and fatigue. Pt required assist from therapist and nurse to sit EOB and transfer from bed to chair with use of a RW. Pt did tolerate sitting up in chair at end of therapy session. Patient will benefit from continued skilled physical therapy in hospital and recommended venue below to increase strength, balance, endurance for safe ADLs and gait.         Assistance Recommended at Discharge Set up Supervision/Assistance  If plan is discharge home, recommend the following:  Can travel by private vehicle  A lot of help with walking and/or transfers;A lot of help with bathing/dressing/bathroom;Assistance with cooking/housework;Assist for transportation         Equipment Recommendations Rolling walker (2 wheels)  Recommendations for Other Services       Functional Status Assessment Patient has had a recent decline in their functional status and demonstrates the ability to make significant improvements in function in a reasonable and predictable amount of time.     Precautions / Restrictions Precautions Precautions: Fall Restrictions Weight Bearing Restrictions: No      Mobility  Bed Mobility Overal bed mobility: Needs Assistance Bed Mobility: Sidelying to Sit, Supine to Sit   Sidelying to sit: Max assist, Mod assist Supine to sit: Max assist     General bed mobility comments: Pt required multiple verbal cues and max assist from therapist and nurse to sit upright at EOB    Transfers Overall transfer level: Needs assistance Equipment used: Rolling walker (2 wheels) Transfers: Sit to/from Stand, Bed to chair/wheelchair/BSC Sit to Stand: Max assist   Step pivot transfers: Mod assist       General transfer comment: Pt required use of RW and max assist from therapist and nurse to perform side steps to the chair    Ambulation/Gait Ambulation/Gait assistance: Max assist Gait Distance (Feet): 3 Feet Assistive device: Rolling walker (2 wheels) Gait Pattern/deviations: Step-to pattern, Decreased step length - right, Decreased step length - left, Decreased stride length Gait velocity: slow     General Gait Details: very labored and required use of RW  Stairs            Wheelchair Mobility     Tilt Bed    Modified Rankin (Stroke Patients Only)       Balance Overall balance assessment: Needs assistance Sitting-balance support: Single extremity supported, Feet supported Sitting balance-Leahy Scale: Poor Sitting balance - Comments: Pt required  support from behind and front to sit EOB   Standing balance support: Reliant on assistive device for balance, Bilateral upper extremity supported, During functional  activity Standing balance-Leahy Scale: Poor Standing balance comment: Pt required support of RW and therapist to maintain standing balance                             Pertinent Vitals/Pain Pain Assessment Pain Assessment: Faces Faces Pain Scale: Hurts a little bit    Home Living Family/patient expects to be discharged to:: Skilled nursing facility                        Prior Function Prior Level of Function : Needs assist;Patient poor historian/Family not available             Mobility Comments: pt poor historian, reports using no assistive devices to mobilize around facility ADLs Comments: pt is poor historian, states they are indpendent for dressing, grooming, and bathing     Hand Dominance        Extremity/Trunk Assessment                Communication   Communication: Receptive difficulties  Cognition Arousal/Alertness: Awake/alert Behavior During Therapy: WFL for tasks assessed/performed Overall Cognitive Status: Within Functional Limits for tasks assessed                                          General Comments      Exercises     Assessment/Plan    PT Assessment Patient needs continued PT services  PT Problem List Decreased strength;Decreased activity tolerance;Decreased balance;Decreased mobility       PT Treatment Interventions DME instruction;Gait training;Functional mobility training;Therapeutic activities;Therapeutic exercise;Balance training    PT Goals (Current goals can be found in the Care Plan section)  Acute Rehab PT Goals Patient Stated Goal: Return home to family PT Goal Formulation: With patient Time For Goal Achievement: 07/07/23 Potential to Achieve Goals: Fair    Frequency Min 3X/week     Co-evaluation               AM-PAC PT "6 Clicks" Mobility  Outcome Measure Help needed turning from your back to your side while in a flat bed without using bedrails?: A Lot Help needed  moving from lying on your back to sitting on the side of a flat bed without using bedrails?: A Lot Help needed moving to and from a bed to a chair (including a wheelchair)?: A Lot Help needed standing up from a chair using your arms (e.g., wheelchair or bedside chair)?: A Lot Help needed to walk in hospital room?: A Lot Help needed climbing 3-5 steps with a railing? : Total 6 Click Score: 11    End of Session   Activity Tolerance: Patient limited by fatigue;Patient limited by pain Patient left: in chair;with call bell/phone within reach Nurse Communication: Mobility status PT Visit Diagnosis: Unsteadiness on feet (R26.81);Other abnormalities of gait and mobility (R26.89);Muscle weakness (generalized) (M62.81)    Time: 4166-0630 PT Time Calculation (min) (ACUTE ONLY): 30 min   Charges:   PT Evaluation $PT Eval Moderate Complexity: 1 Mod PT Treatments $Therapeutic Activity: 23-37 mins PT General Charges $$ ACUTE PT VISIT: 1 Visit        Chike Farrington SPT [place], DPT Program

## 2023-06-30 NOTE — TOC Progression Note (Signed)
Transition of Care [facility]) - Progression Note    Patient Details  Name: [name] MRN: [id number] Date of Birth: 02-Jun-1961  Transition of Care [facility]) CM/SW Contact  Elliot Gault, LCSW Phone Number: 06/30/2023, 12:08 PM  Clinical Narrative:     TOC following. Per MD, anticipating weekend dc. Updated Heather at [facility] via secure message and awaiting return contact with confirmation that they will be able to accept pt over the weekend.  TOC will follow.  Expected Discharge Plan: Skilled Nursing Facility Barriers to Discharge: Continued Medical Work up  Expected Discharge Plan and Services In-house Referral: Clinical Social Work   Post Acute Care Choice: Resumption of Svcs/PTA Provider Living arrangements for the past 2 months: Skilled Nursing Facility                                       Social Determinants of Health (SDOH) Interventions SDOH Screenings   Food Insecurity: Patient Declined (08/12/2022)  Housing: Low Risk  (08/12/2022)  Transportation Needs: No Transportation Needs (08/12/2022)  Utilities: Not At Risk (08/12/2022)  Financial Resource Strain: Medium Risk (08/14/2021)   Received from [facility], [facility]  Physical Activity: Inactive (08/14/2021)   Received from [facility], [facility]  Social Connections: Unknown (04/08/2022)   Received from [facility], [facility]  Stress: Stress Concern Present (08/14/2021)   Received from [facility], [facility]  Tobacco Use: Low Risk  (06/14/2023)   Received from [facility]  Recent Concern: Tobacco Use - Medium Risk (03/21/2023)    Readmission Risk Interventions    06/27/2023    2:47 PM 12/17/2021   11:29 AM 11/23/2021    2:32 PM  Readmission Risk Prevention Plan  Transportation Screening Complete Complete Complete  Home Care Screening  Complete   Medication Review (RN CM)  Complete Complete  HRI or Home Care Consult Complete    Social Work Consult for Recovery Care  Planning/Counseling Complete    Palliative Care Screening Not Applicable    Medication Review Oceanographer) Complete

## 2023-06-30 NOTE — Progress Notes (Signed)
Progress Note   Patient: [name]:[id number] DOB: [birth year] DOA: 06/27/2023     3 DOS: the patient was seen and examined on 06/30/2023   Brief hospital admission narrative: [name] is a 62 y.o. 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  Patient's symptoms have been present since Friday (06/24/2023) and has been worsening throughout.  Initial chest x-ray and COVID test as an outpatient failed to demonstrate acute abnormalities.  Given further deterioration patient has been transferred to the emergency department for further treatment.  Workup demonstrating vascular congestion and new bilateral infiltrates suggestive of pneumonia along with acute on chronic heart failure. Patient require the use of BiPAP to stabilize oxygen saturation and ease of respiratory distress.  Patient has confirmed to be DNR and to not one intubation if required.   In the ED cultures taken, antibiotics given and TRH consulted to place patient in the hospital for further evaluation and management.   Of note, patient denies abdominal pain, chest pain, nausea, vomiting, dysuria, hematuria, melena, hematochezia or focal weaknesses.  Assessment and Plan: 1)Acute on chronic respiratory failure with hypoxia -- -Patient did not use oxygen at baseline -Acute hypoxemic respiratory failure appears to be multifactorial in the setting of COPD exacerbation, CHF exacerbation and multifocal pneumonia.   -Flutter valve and incentive spirometer provided. -Will continue BiPAP nightly (patient body habitus and prior history suggesting presence of obesity hypoventilation syndrome-most likely OSA)  and continue to wean off oxygen supplementation as tolerated. -Continue daily weights, strict I's and O's and IV diuresis -Follow clinical response. -Patient has confirmed DNR. 06/30/23 -as per husband pt has been on oxygen on and off at [facility] over last couple of months  -Continue BiPAP nightly and supplemental oxygen during the day -Continue Unasyn and bronchodilators -Currently on IV Solu-Medrol, okay to de-escalate steroids  2)Acute COPD exacerbation------ steroids, flutter valve, antibiotics and bronchodilators and mucolytics as above #1  3)Acute on chronic diastolic CHF (congestive heart failure) (HCC) -Patient with vascular congestion appreciated on x-ray and an elevated BNP -Echo from 06/27/2023 with EF of 60-65 patient with grade 1 diastolic dysfunction -hold Lasix for now -Strict I's and O's, daily weight and low-sodium diet to be followed. -Continue beta-blocker. -BiPAP use as above  4)Bacteremia---Blood cultures Positive for Haemophilus influenza and Beta Lactamase -- Continue Unasyn  5)Acute renal failure superimposed on chronic kidney disease (HCC) -Creatinine currently around 2  -Baseline creatinine is between 1.3-1.5 -Avoid hypotension, minimize/avoid the use of contrast and closely follow renal function while diuresing patient -Continue strict I's and O's.  GERD (gastroesophageal reflux disease) -Continue PPI.  Mixed hyperlipidemia -Continue statin  Morbid obesity with BMI of 50.0-59.9, adult (HCC) -Body mass index is 54.11 kg/m. -Low-calorie diet, portion control and increase physical activity discussed with patient.  OSA (obstructive sleep apnea) -Continue the use of BiPAP nightly.  Prolonged QT interval -Continue telemetry monitoring. -replete electrolytes as needed and minimize the use of medications that can further prolong QT.  Depression -Stable mood/flat affect -Continue home antidepressant management. -No hallucinations and no suicidal  ideation currently.  Essential hypertension -Continue amlodipine 5 mg daily -Hydralazine 25 mg 3 times daily -Heart healthy diet ordered.  Diabetes mellitus (HCC) -A1c 8.5 -Persistent steroid induced hypoglycemia noted -Continue sliding scale insulin  and the use of Semglee -Anticipate increase in blood sugar levels due to the use of steroids -Continue modified carbohydrate diet  Social/Ethics--- Has been in SNF since 11/2021, transferred to [facility] 11/2022 Plan of care and advised directive discussed with husband -Patient remains a DNR/DNI -At baseline she is usually able to ambulate few feet with a walker  Hypothyroidism--continue levothyroxine  Subjective:  -Less sleepy -Oral intake remains poor -Husband visited  Physical Exam: Vitals:   06/30/23 1400 06/30/23 1429 06/30/23 1500 06/30/23 1621  BP: (!) 141/62 (!) 141/62 (!) 123/96   Pulse: 73     Resp: (!) 24  (!) 23   Temp:    98 F (36.7 C)  TempSrc:    Oral  SpO2: 94%     Weight:      Height:        Physical Exam  Gen:-Awake, resting comfortably  HEENT:- [place].AT, No sclera icterus Nose-BiPAP alternating with nasal cannula Neck-Supple Neck,No JVD,.  Lungs-diminished sounds, no wheezing CV- S1, S2 normal, RRR Abd-  +ve B.Sounds, Abd Soft, No tenderness, increased truncal adiposity Extremity/Skin:- No  edema,   good pedal pulses  Psych-affect is appropriate, oriented x3--more awake Neuro-generalized weakness, no new focal deficits, no tremors   Family Communication: Updated spouse on 06/30/2023 Disposition: Most likely SNF rehab Status is: Inpatient Remains inpatient appropriate because: Continue treatment with IV antibiotics, steroids, bronchodilator management and IV diuretics.   Planned Discharge Destination: Skilled nursing facility   Author: Shon Hale, MD 06/30/2023 5:29 PM  For on call review [email or web address].

## 2023-07-01 ENCOUNTER — Inpatient Hospital Stay (HOSPITAL_COMMUNITY): Payer: Medicare HMO

## 2023-07-01 DIAGNOSIS — J9601 Acute respiratory failure with hypoxia: Secondary | ICD-10-CM | POA: Diagnosis not present

## 2023-07-01 LAB — GLUCOSE, CAPILLARY
Glucose-Capillary: 391 mg/dL — ABNORMAL HIGH (ref 70–99)
Glucose-Capillary: 504 mg/dL (ref 70–99)
Glucose-Capillary: 448 mg/dL — ABNORMAL HIGH (ref 70–99)
Glucose-Capillary: 310 mg/dL — ABNORMAL HIGH (ref 70–99)

## 2023-07-01 MED ORDER — GUAIFENESIN-DM 100-10 MG/5ML PO SYRP
5.0000 mL | ORAL_SOLUTION | ORAL | Status: DC | PRN
  Administered 2023-07-01: 5 mL via ORAL
  Filled 2023-07-01: qty 5

## 2023-07-01 MED ORDER — INSULIN GLARGINE-YFGN 100 UNIT/ML ~~LOC~~ SOLN
15.0000 [IU] | Freq: Once | SUBCUTANEOUS | Status: AC
  Administered 2023-07-01: 15 [IU] via SUBCUTANEOUS
  Filled 2023-07-01: qty 0.15

## 2023-07-01 MED ORDER — INSULIN ASPART 100 UNIT/ML IJ SOLN
48.0000 [IU] | Freq: Once | INTRAMUSCULAR | Status: AC
  Administered 2023-07-01: 48 [IU] via SUBCUTANEOUS

## 2023-07-01 MED ORDER — INSULIN GLARGINE-YFGN 100 UNIT/ML ~~LOC~~ SOLN
40.0000 [IU] | Freq: Two times a day (BID) | SUBCUTANEOUS | Status: DC
  Administered 2023-07-01 – 2023-07-02 (×2): 40 [IU] via SUBCUTANEOUS
  Filled 2023-07-01 (×4): qty 0.4

## 2023-07-01 MED ORDER — INSULIN ASPART 100 UNIT/ML IJ SOLN
50.0000 [IU] | Freq: Once | INTRAMUSCULAR | Status: AC
  Administered 2023-07-01: 50 [IU] via SUBCUTANEOUS

## 2023-07-01 NOTE — Plan of Care (Signed)
  Problem: Education: Goal: Ability to demonstrate management of disease process will improve Outcome: Progressing Goal: Ability to verbalize understanding of medication therapies will improve Outcome: Progressing Goal: Individualized Educational Video(s) Outcome: Progressing   Problem: Activity: Goal: Capacity to carry out activities will improve Outcome: Progressing   Problem: Cardiac: Goal: Ability to achieve and maintain adequate cardiopulmonary perfusion will improve Outcome: Progressing   Problem: Activity: Goal: Ability to tolerate increased activity will improve Outcome: Progressing   Problem: Clinical Measurements: Goal: Ability to maintain a body temperature in the normal range will improve Outcome: Progressing   Problem: Respiratory: Goal: Ability to maintain adequate ventilation will improve Outcome: Progressing Goal: Ability to maintain a clear airway will improve Outcome: Progressing   Problem: Education: Goal: Ability to describe self-care measures that may prevent or decrease complications (Diabetes Survival Skills Education) will improve Outcome: Progressing Goal: Individualized Educational Video(s) Outcome: Progressing   Problem: Coping: Goal: Ability to adjust to condition or change in health will improve Outcome: Progressing   Problem: Fluid Volume: Goal: Ability to maintain a balanced intake and output will improve Outcome: Progressing   Problem: Health Behavior/Discharge Planning: Goal: Ability to identify and utilize available resources and services will improve Outcome: Progressing Goal: Ability to manage health-related needs will improve Outcome: Progressing   Problem: Nutritional: Goal: Maintenance of adequate nutrition will improve Outcome: Progressing Goal: Progress toward achieving an optimal weight will improve Outcome: Progressing   Problem: Skin Integrity: Goal: Risk for impaired skin integrity will decrease Outcome:  Progressing   Problem: Tissue Perfusion: Goal: Adequacy of tissue perfusion will improve Outcome: Progressing   Problem: Education: Goal: Knowledge of General Education information will improve Description: Including pain rating scale, medication(s)/side effects and non-pharmacologic comfort measures Outcome: Progressing   Problem: Health Behavior/Discharge Planning: Goal: Ability to manage health-related needs will improve Outcome: Progressing   Problem: Clinical Measurements: Goal: Ability to maintain clinical measurements within normal limits will improve Outcome: Progressing Goal: Will remain free from infection Outcome: Progressing Goal: Diagnostic test results will improve Outcome: Progressing Goal: Respiratory complications will improve Outcome: Progressing Goal: Cardiovascular complication will be avoided Outcome: Progressing   Problem: Activity: Goal: Risk for activity intolerance will decrease Outcome: Progressing   Problem: Nutrition: Goal: Adequate nutrition will be maintained Outcome: Progressing   Problem: Coping: Goal: Level of anxiety will decrease Outcome: Progressing   Problem: Elimination: Goal: Will not experience complications related to bowel motility Outcome: Progressing Goal: Will not experience complications related to urinary retention Outcome: Progressing   Problem: Pain Managment: Goal: General experience of comfort will improve Outcome: Progressing   Problem: Safety: Goal: Ability to remain free from injury will improve Outcome: Progressing   Problem: Skin Integrity: Goal: Risk for impaired skin integrity will decrease Outcome: Progressing   Problem: Education: Goal: Ability to demonstrate management of disease process will improve Outcome: Progressing Goal: Ability to verbalize understanding of medication therapies will improve Outcome: Progressing Goal: Individualized Educational Video(s) Outcome: Progressing   Problem:  Activity: Goal: Capacity to carry out activities will improve Outcome: Progressing   Problem: Cardiac: Goal: Ability to achieve and maintain adequate cardiopulmonary perfusion will improve Outcome: Progressing

## 2023-07-01 NOTE — [place] FL2 (Signed)
[place] MEDICAID FL2 LEVEL OF CARE FORM     IDENTIFICATION  Patient Name: [name] Birthdate: [birth year] Sex: female Admission Date (Current Location): 06/27/2023  [place] and [place] Number:  [name] [id number] R Facility and Address:  [facility],  [address] [place] [address]      Provider Number: [id number]  Attending Physician Name and Address:  Shon Hale, MD  Relative Name and Phone Number:       Current Level of Care: Hospital Recommended Level of Care: Skilled Nursing Facility Prior Approval Number:    Date Approved/Denied:   PASRR Number:    Discharge Plan: SNF    Current Diagnoses: Patient Active Problem List   Diagnosis Date Noted   Acute respiratory failure with hypoxia (HCC) 06/27/2023   Colon cancer (HCC) 08/11/2022   Cancer of sigmoid colon (HCC) 05/12/2022   Diarrhea of presumed infectious origin 03/09/2022   Acute metabolic encephalopathy 12/16/2021   Iron deficiency anemia    Pressure injury of skin 11/24/2021   Open wound of left foot 11/22/2021   Chronic venous stasis 11/22/2021   Leukocytosis 11/22/2021   Thrombocytosis 11/22/2021   Hyperglycemia due to diabetes mellitus (HCC) 11/22/2021   Lactic acidosis 11/22/2021   Hypoalbuminemia due to protein-calorie malnutrition (HCC) 11/22/2021   Elevated liver enzymes 11/22/2021   Mixed hyperlipidemia 11/22/2021   GERD (gastroesophageal reflux disease) 11/22/2021   Acute on chronic diastolic CHF (congestive heart failure) (HCC) 11/22/2021   Diabetic foot infection (HCC) 11/22/2021   Uncontrolled type 2 diabetes mellitus with hyperglycemia, with long-term current use of insulin (HCC) 11/22/2021   Lower extremity cellulitis 11/21/2021   Morbid obesity with BMI of 50.0-59.9, adult (HCC) 01/29/2020   Educated about COVID-19 virus infection 01/29/2020   OSA (obstructive sleep apnea) 12/21/2019   Asthma 12/21/2019   Acute renal failure superimposed on chronic  kidney disease (HCC) 09/27/2019   Diastolic dysfunction 05/15/2015   Prolonged QT interval 05/14/2015   Palpitations 07/23/2014   Generalized weakness 07/23/2014   Diabetes mellitus (HCC) 07/22/2014   Essential hypertension 07/22/2014   COPD (chronic obstructive pulmonary disease) (HCC) 07/22/2014   Depression 07/22/2014    Orientation RESPIRATION BLADDER Height & Weight     Self, Place  O2 (3L) External catheter Weight: 264 lb 8.8 oz (120 kg) Height:  5\' 1"  (154.9 cm)  BEHAVIORAL SYMPTOMS/MOOD NEUROLOGICAL BOWEL NUTRITION STATUS      Incontinent Diet (Heart healthy/carb modified. See d/c summary for updates.)  AMBULATORY STATUS COMMUNICATION OF NEEDS Skin   Extensive Assist Verbally Skin abrasions                       Personal Care Assistance Level of Assistance  Bathing, Feeding, Dressing Bathing Assistance: Maximum assistance Feeding assistance: Limited assistance Dressing Assistance: Maximum assistance     Functional Limitations Info  Sight, Hearing, Speech Sight Info: Adequate Hearing Info: Adequate Speech Info: Adequate    SPECIAL CARE FACTORS FREQUENCY  PT (By licensed PT)     PT Frequency: 5x weekly              Contractures      Additional Factors Info  Code Status, Allergies, Psychotropic Code Status Info: DNR Allergies Info: Carvedilol, Benicar (olmesartan), Codeine, Sulfa Antibiotics, Trulicity (dulaglutide), Ceftriaxone Psychotropic Info: Zoloft         Current Medications (07/01/2023):  This is the current hospital active medication list Current Facility-Administered Medications  Medication Dose Route Frequency Provider Last Rate Last Admin   0.9 %  sodium chloride infusion  250 mL Intravenous PRN Vassie Loll, MD       acetaminophen (TYLENOL) tablet 650 mg  650 mg Oral Q6H PRN Vassie Loll, MD       Or   acetaminophen (TYLENOL) suppository 650 mg  650 mg Rectal Q6H PRN Vassie Loll, MD       amLODipine (NORVASC) tablet 5 mg  5  mg Oral Daily Vassie Loll, MD   5 mg at 07/01/23 0806   Ampicillin-Sulbactam (UNASYN) 3 g in sodium chloride 0.9 % 100 mL IVPB  3 g Intravenous Q6H Vassie Loll, MD 200 mL/hr at 07/01/23 0816 3 g at 07/01/23 0816   arformoterol (BROVANA) nebulizer solution 15 mcg  15 mcg Nebulization BID Vassie Loll, MD   15 mcg at 07/01/23 7829   ascorbic acid (VITAMIN C) tablet 500 mg  500 mg Oral Daily Vassie Loll, MD   500 mg at 07/01/23 5621   aspirin EC tablet 81 mg  81 mg Oral Daily Vassie Loll, MD   81 mg at 07/01/23 0816   atorvastatin (LIPITOR) tablet 10 mg  10 mg Oral QHS Vassie Loll, MD   10 mg at 06/30/23 2117   budesonide (PULMICORT) nebulizer solution 0.5 mg  0.5 mg Nebulization BID Vassie Loll, MD   0.5 mg at 07/01/23 0844   Chlorhexidine Gluconate Cloth 2 % PADS 6 each  6 each Topical Q0600 Vassie Loll, MD   6 each at 07/01/23 0533   guaiFENesin-dextromethorphan (ROBITUSSIN DM) 100-10 MG/5ML syrup 5 mL  5 mL Oral Q4H PRN Shon Hale, MD   5 mL at 07/01/23 0311   heparin injection 5,000 Units  5,000 Units Subcutaneous Q8H Vassie Loll, MD   5,000 Units at 07/01/23 0530   hydrALAZINE (APRESOLINE) tablet 25 mg  25 mg Oral Q8H Vassie Loll, MD   25 mg at 07/01/23 0531   insulin aspart (novoLOG) injection 0-20 Units  0-20 Units Subcutaneous TID WC Shon Hale, MD   20 Units at 07/01/23 0808   insulin aspart (novoLOG) injection 0-5 Units  0-5 Units Subcutaneous QHS Vassie Loll, MD   4 Units at 06/30/23 2118   insulin aspart (novoLOG) injection 4 Units  4 Units Subcutaneous TID WC Emokpae, Courage, MD   4 Units at 07/01/23 0809   insulin glargine-yfgn (SEMGLEE) injection 30 Units  30 Units Subcutaneous BID Emokpae, Courage, MD   30 Units at 07/01/23 1006   ipratropium-albuterol (DUONEB) 0.5-2.5 (3) MG/3ML nebulizer solution 3 mL  3 mL Nebulization QID Vassie Loll, MD   3 mL at 07/01/23 0843   labetalol (NORMODYNE) injection 10 mg  10 mg Intravenous Q4H PRN Emokpae,  Courage, MD       levothyroxine (SYNTHROID) tablet 88 mcg  88 mcg Oral QAC breakfast Vassie Loll, MD   88 mcg at 07/01/23 0532   magnesium oxide (MAG-OX) tablet 400 mg  400 mg Oral Daily Vassie Loll, MD   400 mg at 07/01/23 3086   mupirocin ointment (BACTROBAN) 2 % 1 Application  1 Application Nasal BID Vassie Loll, MD   1 Application at 07/01/23 0809   nystatin (MYCOSTATIN/NYSTOP) topical powder   Topical BID PRN Vassie Loll, MD       Oral care mouth rinse  15 mL Mouth Rinse 4 times per day Vassie Loll, MD   15 mL at 07/01/23 5784   Oral care mouth rinse  15 mL Mouth Rinse PRN Vassie Loll, MD       Oral care mouth rinse  15 mL Mouth Rinse PRN Emokpae, Courage, MD       Oral care mouth rinse  15 mL Mouth Rinse PRN Emokpae, Courage, MD       pantoprazole (PROTONIX) EC tablet 40 mg  40 mg Oral Daily Vassie Loll, MD   40 mg at 07/01/23 0806   predniSONE (DELTASONE) tablet 40 mg  40 mg Oral Q breakfast Emokpae, Courage, MD   40 mg at 07/01/23 0806   prochlorperazine (COMPAZINE) injection 10 mg  10 mg Intravenous Q6H PRN Vassie Loll, MD       propranolol ER (INDERAL LA) 24 hr capsule 80 mg  80 mg Oral Daily Vassie Loll, MD   80 mg at 06/30/23 1222   saccharomyces boulardii (FLORASTOR) capsule 250 mg  250 mg Oral BID Vassie Loll, MD   250 mg at 07/01/23 0806   sodium chloride flush (NS) 0.9 % injection 3 mL  3 mL Intravenous Q12H Vassie Loll, MD   3 mL at 07/01/23 0817   sodium chloride flush (NS) 0.9 % injection 3 mL  3 mL Intravenous PRN Vassie Loll, MD         Discharge Medications: Please see discharge summary for a list of discharge medications.  Relevant Imaging Results:  Relevant Lab Results:   Additional Information    Karn Cassis, LCSW

## 2023-07-01 NOTE — Inpatient Diabetes Management (Signed)
Inpatient Diabetes Program Recommendations  AACE/ADA: New Consensus Statement on Inpatient Glycemic Control (2015)  Target Ranges:  Prepandial:   less than 140 mg/dL      Peak postprandial:   less than 180 mg/dL (1-2 hours)      Critically ill patients:  140 - 180 mg/dL   Lab Results  Component Value Date   GLUCAP 504 (HH) 07/01/2023   HGBA1C 8.5 (H) 06/27/2023    Review of Glycemic Control  Latest Reference Range & Units 07/01/23 07:52 07/01/23 11:18  Glucose-Capillary 70 - 99 mg/dL 854 (H) 627 (HH)  (HH): Data is critically high (H): Data is abnormally high  Diabetes history: DM2 Outpatient Diabetes medications: Lantus 35 units at bedtime, Novolog 10 units TID with meals if CBG over 150 mg/dl,  Current orders for Inpatient glycemic control: Semglee 40 units BID, Novolog 0-20 units TID, Novolog 0-5 units at bedtime, Novolog 4 units TID with meals; Prednisone 40 units QAM  Inpatient Diabetes Program Recommendations:    Noted basal insulin increased to 40 BID.  Please consider:  Novolog 8 units TID with meals if she consumes at least 50%.  Will continue to follow while inpatient.  Thank you, Dulce Sellar, MSN, CDCES Diabetes Coordinator Inpatient Diabetes Program [phone] (team pager from 8a-5p)

## 2023-07-01 NOTE — TOC Progression Note (Addendum)
Transition of Care [facility]) - Progression Note    Patient Details  Name: [name] MRN: [id number] Date of Birth: September 09, 1961  Transition of Care [facility]) CM/SW Contact  Karn Cassis, [place] Phone Number: 07/01/2023, 10:19 AM  Clinical Narrative:  [name] at [facility] updated on plan to d/c tomorrow.  Authorization started. If auth not received prior to d/c, pt will return long term. Heather agreeable.     Expected Discharge Plan: Skilled Nursing Facility Barriers to Discharge: Continued Medical Work up  Expected Discharge Plan and Services In-house Referral: Clinical Social Work   Post Acute Care Choice: Resumption of Svcs/PTA Provider Living arrangements for the past 2 months: Skilled Nursing Facility                                       Social Determinants of Health (SDOH) Interventions SDOH Screenings   Food Insecurity: Patient Declined (08/12/2022)  Housing: Low Risk  (08/12/2022)  Transportation Needs: No Transportation Needs (08/12/2022)  Utilities: Not At Risk (08/12/2022)  Financial Resource Strain: Medium Risk (08/14/2021)   Received from [facility], [facility]  Physical Activity: Inactive (08/14/2021)   Received from [facility], [facility]  Social Connections: Unknown (04/08/2022)   Received from [facility], [facility]  Stress: Stress Concern Present (08/14/2021)   Received from [facility], [facility]  Tobacco Use: Low Risk  (06/14/2023)   Received from [facility]  Recent Concern: Tobacco Use - Medium Risk (03/21/2023)    Readmission Risk Interventions    06/27/2023    2:47 PM 12/17/2021   11:29 AM 11/23/2021    2:32 PM  Readmission Risk Prevention Plan  Transportation Screening Complete Complete Complete  Home Care Screening  Complete   Medication Review (RN CM)  Complete Complete  HRI or Home Care Consult Complete    Social Work Consult for Recovery Care Planning/Counseling Complete    Palliative Care  Screening Not Applicable    Medication Review Oceanographer) Complete

## 2023-07-01 NOTE — Plan of Care (Signed)
  Problem: Education: Goal: Ability to demonstrate management of disease process will improve Outcome: Progressing   Problem: Activity: Goal: Capacity to carry out activities will improve Outcome: Progressing   Problem: Activity: Goal: Ability to tolerate increased activity will improve Outcome: Progressing   Problem: Clinical Measurements: Goal: Ability to maintain clinical measurements within normal limits will improve Outcome: Progressing   Problem: Skin Integrity: Goal: Risk for impaired skin integrity will decrease Outcome: Progressing   Problem: Education: Goal: Ability to demonstrate management of disease process will improve Outcome: Progressing   Problem: Activity: Goal: Capacity to carry out activities will improve Outcome: Progressing

## 2023-07-01 NOTE — Progress Notes (Signed)
Progress Note   Patient: [name] AOZ:[id number] DOB: [birth year] DOA: 06/27/2023     4 DOS: the patient was seen and examined on 07/01/2023   Brief hospital admission narrative: [name] is a 62 y.o. 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  Patient's symptoms have been present since Friday (06/24/2023) and has been worsening throughout.  Initial chest x-ray and COVID test as an outpatient failed to demonstrate acute abnormalities.  Given further deterioration patient has been transferred to the emergency department for further treatment.  Workup demonstrating vascular congestion and new bilateral infiltrates suggestive of pneumonia along with acute on chronic heart failure. Patient require the use of BiPAP to stabilize oxygen saturation and ease of respiratory distress.  Patient has confirmed to be DNR and to not one intubation if required.   In the ED cultures taken, antibiotics given and TRH consulted to place patient in the hospital for further evaluation and management.   Of note, patient denies abdominal pain, chest pain, nausea, vomiting, dysuria, hematuria, melena, hematochezia or focal weaknesses.  Assessment and Plan: 1)Acute on chronic respiratory failure with hypoxia -- -Patient did not use oxygen at baseline -Acute hypoxemic respiratory failure appears to be multifactorial in the setting of COPD exacerbation, CHF exacerbation and multifocal pneumonia.   -Flutter valve and incentive spirometer provided. -Will continue BiPAP nightly (patient body habitus and prior history suggesting presence of obesity hypoventilation syndrome-most likely OSA)  and continue to wean off oxygen supplementation as tolerated. -Continue daily weights, strict I's and O's and IV diuresis -Follow clinical response. -Patient has confirmed DNR. 07/01/23 -as per husband pt has been on oxygen on and off at [facility] over last couple of months  -Clinically improved overall -Continue BiPAP nightly and supplemental oxygen during the day -Continue Unasyn and bronchodilators --Discontinue IV Solu-Medrol currently on p.o. prednisone  2)Acute COPD exacerbation------ steroids, flutter valve, antibiotics and bronchodilators and mucolytics as above #1 -Overall much improved  3)Acute on chronic diastolic CHF (congestive heart failure) (HCC) -Patient with vascular congestion appreciated on x-ray and an elevated BNP -Echo from 06/27/2023 with EF of 60-65 patient with grade 1 diastolic dysfunction -hold Lasix for now -Strict I's and O's, daily weight and low-sodium diet to be followed. -Continue beta-blocker. -BiPAP use as above  4)Bacteremia---Blood cultures Positive for Haemophilus influenza and Beta Lactamase -- Continue Unasyn  5)Acute renal failure superimposed on chronic kidney disease (HCC) -Creatinine currently down to 1.77 -Baseline creatinine is between 1.3-1.5 -Avoid hypotension, minimize/avoid the use of contrast and closely follow renal function while diuresing patient -Continue strict I's and O's.  GERD (gastroesophageal reflux disease) -Continue PPI.  Mixed hyperlipidemia -Continue statin  Morbid obesity with BMI of 50.0-59.9, adult (HCC) -Body mass index is 54.11 kg/m. -Low-calorie diet, portion control and increase physical activity discussed with patient.  OSA (obstructive sleep apnea) -Continue the use of BiPAP nightly.  Prolonged QT interval -Continue telemetry monitoring. -replete electrolytes as needed and minimize the use of medications that can further prolong QT.  Depression -Stable mood/flat affect -Continue home  antidepressant management. -No hallucinations and no suicidal ideation currently.  Essential hypertension -Continue amlodipine 5 mg daily -Hydralazine 25 mg 3 times daily -Heart healthy diet ordered.  Diabetes mellitus (HCC) -A1c 8.5 -Persistent steroid induced hypoglycemia  noted -Long-acting and short acting insulin adjusted -Anticipate increase in blood sugar levels due to the use of steroids -Continue modified carbohydrate diet  Social/Ethics--- Has been in SNF since 11/2021, transferred to [facility] 11/2022 Plan of care and advised directive discussed with husband -Patient remains a DNR/DNI -At baseline she is usually able to ambulate few feet with a walker  Hypothyroidism--continue levothyroxine  Subjective:  Sitting up in a chair, mentation has improved, oral intake improving -Blood sugars remained high -If continues to improve may discharge back to [facility] SNF on 07/02/2023  Physical Exam: Vitals:   07/01/23 1607 07/01/23 1622 07/01/23 1700 07/01/23 1800  BP:    (!) 163/61  Pulse:   64   Resp:   (!) 21   Temp:  97.7 F (36.5 C)    TempSrc:  Axillary    SpO2: 96%  96%   Weight:      Height:        Physical Exam  Gen:-Awake, resting comfortably  HEENT:- [place].AT, No sclera icterus Nose-BiPAP alternating with nasal cannula Neck-Supple Neck,No JVD,.  Lungs-diminished sounds, no wheezing CV- S1, S2 normal, RRR Abd-  +ve B.Sounds, Abd Soft, No tenderness, increased truncal adiposity Extremity/Skin:- No  edema,   good pedal pulses  Psych-affect is appropriate, oriented x3--more awake--mentation continues to improve Neuro-generalized weakness, no new focal deficits, no tremors   Family Communication: Updated spouse on 06/30/2023 Disposition: Most likely SNF rehab on 07/02/2023 Status is: Inpatient Remains inpatient appropriate because: Continue treatment with IV antibiotics, steroids, bronchodilator management and IV diuretics.   Planned Discharge  Destination: Skilled nursing facility   Author: Shon Hale, MD 07/01/2023 7:10 PM  For on call review [email or web address].

## 2023-07-01 NOTE — Progress Notes (Signed)
Physical Therapy Treatment Patient Details Name: [name] MRN: [id number] DOB: 1961/01/18 Today's Date: 07/01/2023   History of Present Illness HPI: [name] is a 62 y.o. 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  Patient's symptoms have been present since Friday (06/24/2023) and has been worsening throughout.  Initial chest x-ray and COVID test as an outpatient failed to demonstrate acute abnormalities.  Given further deterioration patient has been transferred to the emergency department for further treatment.  Workup demonstrating vascular congestion and new bilateral infiltrates suggestive of pneumonia along with acute on chronic heart failure.  Patient require the use of BiPAP to stabilize oxygen saturation and ease of respiratory distress.  Patient has confirmed to be DNR and to not one intubation if required.  BSE requested.    PT Comments  Pt tolerated treatment well and is in good spirits. Required mod assist to sit EOB but was able to perform LE strengthening exercises while supporting self upright.  Participation in ambulation is limited due to LE weakness and fatigue. Continuing to use RW and O2. Pt tolerated sitting upright in chair at end of therapy. Patient will benefit from continued skilled physical therapy in hospital and recommended venue below to increase strength, balance, endurance for safe ADLs and gait.     Assistance Recommended at Discharge Set up Supervision/Assistance  If plan is discharge home, recommend the following:  Can travel by private vehicle    A lot of help with walking and/or transfers;A lot of help with  bathing/dressing/bathroom;Assistance with cooking/housework;Assist for transportation      Equipment Recommendations  Rolling walker (2 wheels)    Recommendations for Other Services       Precautions / Restrictions Precautions Precautions: Fall Restrictions Weight Bearing Restrictions: No     Mobility  Bed Mobility Overal bed mobility: Needs Assistance Bed Mobility: Sidelying to Sit, Supine to Sit   Sidelying to sit: Max assist, Mod assist Supine to sit: Max assist     General bed mobility comments: Pt required verbal cues and max-mod assist from therapist to sit upright at EOB    Transfers Overall transfer level: Needs assistance Equipment used: Rolling walker (2 wheels) Transfers: Sit to/from Stand, Bed to chair/wheelchair/BSC Sit to Stand: Max assist   Step pivot transfers: Mod assist       General transfer comment: Pt required use of RW and max assist from therapist and nurse to perform side steps to the chair    Ambulation/Gait Ambulation/Gait assistance: Max assist, Mod assist Gait Distance (Feet): 3 Feet Assistive device: Rolling walker (2 wheels) Gait Pattern/deviations: Step-to pattern, Decreased step length - right, Decreased step length - left, Decreased stride length Gait velocity: slow     General Gait Details: very labored and required use of RW   Stairs             Wheelchair Mobility     Tilt Bed    Modified Rankin (Stroke Patients Only)       Balance Overall balance assessment: Needs assistance Sitting-balance support: Single extremity supported, Feet supported Sitting balance-Leahy Scale: Fair Sitting balance - Comments: Pt required support of at least one arm to sit EOB   Standing balance support: Reliant on assistive device for balance, Bilateral upper extremity supported,  During functional activity Standing balance-Leahy Scale: Poor Standing balance comment: Pt required support of RW and therapist to maintain standing  balance                            Cognition Arousal/Alertness: Awake/alert Behavior During Therapy: WFL for tasks assessed/performed Overall Cognitive Status: Within Functional Limits for tasks assessed                                          Exercises General Exercises - Lower Extremity Ankle Circles/Pumps: AROM, Strengthening, Seated, Right, Left, 10 reps Long Arc Quad: AROM, Strengthening, Right, Left, 10 reps, Seated Hip Flexion/Marching: AROM, Strengthening, Seated, Right, Left, 10 reps Toe Raises: AROM, Strengthening, Right, Seated, Left, 10 reps Heel Raises: AROM, Strengthening, Seated, Right, Left, 10 reps    General Comments        Pertinent Vitals/Pain Pain Assessment Pain Assessment: Faces Faces Pain Scale: Hurts a little bit Pain Location: legs Pain Descriptors / Indicators: Discomfort Pain Intervention(s): Limited activity within patient's tolerance, Monitored during session    Home Living                          Prior Function            PT Goals (current goals can now be found in the care plan section) Acute Rehab PT Goals Patient Stated Goal: Return home to family PT Goal Formulation: With patient Time For Goal Achievement: 07/07/23 Potential to Achieve Goals: Good Progress towards PT goals: Progressing toward goals    Frequency    Min 3X/week      PT Plan Current plan remains appropriate    Co-evaluation              AM-PAC PT "6 Clicks" Mobility   Outcome Measure  Help needed turning from your back to your side while in a flat bed without using bedrails?: A Lot Help needed moving from lying on your back to sitting on the side of a flat bed without using bedrails?: A Lot Help needed moving to and from a bed to a chair (including a wheelchair)?: A Lot Help needed standing up from a chair using your arms (e.g., wheelchair or bedside chair)?: A Lot Help needed to walk in hospital room?: A  Lot Help needed climbing 3-5 steps with a railing? : Total 6 Click Score: 11    End of Session   Activity Tolerance: Patient limited by fatigue;Patient limited by pain;Patient tolerated treatment well Patient left: in chair;with call bell/phone within reach Nurse Communication: Mobility status PT Visit Diagnosis: Unsteadiness on feet (R26.81);Other abnormalities of gait and mobility (R26.89);Muscle weakness (generalized) (M62.81)     Time: 1610-9604 PT Time Calculation (min) (ACUTE ONLY): 30 min  Charges:    $Therapeutic Exercise: 8-22 mins $Therapeutic Activity: 8-22 mins PT General Charges $$ ACUTE PT VISIT: 1 Visit                    Ronniesha Seibold SPT [place] [facility], DPT Program

## 2023-07-01 NOTE — Care Management Important Message (Signed)
Important Message  Patient Details  Name: [name] MRN: [id number] Date of Birth: 09-Dec-1960   Medicare Important Message Given:  Yes     Corey Harold 07/01/2023, 11:15 AM

## 2023-07-01 NOTE — Progress Notes (Signed)
Speech Language Pathology Treatment: Dysphagia  Patient Details Name: [name] MRN: [id number] DOB: Jul 30, 1961 Today's Date: 07/01/2023 Time: 4696-2952 SLP Time Calculation (min) (ACUTE ONLY): 22 min  Assessment / Plan / Recommendation Clinical Impression  Ongoing diagnostic dysphagia therapy provided today; attempted to complete MBSS today however, unfortunately Radiology was unable to accommodate scheduling. SLP provided trials at bedside. With consecutive sips of thin via straw Pt demonstrated a delayed cough and one immediate throat clear. When Pt took small controlled bites (of regular) and small controlled sips of thin, note no overt s/sx of aspiration. Echo thoughts from recent dysphagia treatment: suspect episodic aspiration events with poor coordination of respiration and swallow. SLP reviewed universal aspiration precautions and strategies including: sip upright for all PO, take rest breaks and breaks to breathe, take small bites sips and avoid any problematic textures. Recommend continue with current diet. ST will continue to follow acutely.    HPI HPI: [name] is a 62 y.o. 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  Patient's symptoms have been present since Friday (06/24/2023) and has been worsening throughout.  Initial chest x-ray and COVID test as an outpatient failed to demonstrate acute abnormalities.  Given further deterioration patient has been transferred to the emergency department for further treatment.  Workup demonstrating vascular congestion and new bilateral infiltrates suggestive of pneumonia along with acute on chronic heart failure.   Patient require the use of BiPAP to stabilize oxygen saturation and ease of respiratory distress.  Patient has confirmed to be DNR and to not one intubation if required.  BSE requested.      SLP Plan  Continue with current plan of care      Recommendations for follow up therapy are one component of a multi-disciplinary discharge planning process, led by the attending physician.  Recommendations may be updated based on patient status, additional functional criteria and insurance authorization.    Recommendations  Diet recommendations: Regular;Thin liquid Liquids provided via: Cup;Straw Medication Administration: Whole meds with puree Supervision: Patient able to self feed;Intermittent supervision to cue for compensatory strategies Compensations: Slow rate;Small sips/bites;Multiple dry swallows after each bite/sip Postural Changes and/or Swallow Maneuvers: Seated upright 90 degrees;Out of bed for meals;Upright 30-60 min after meal                  Oral care BID     Dysphagia, unspecified (R13.10)     Continue with current plan of care     Minor Iden H. Romie Levee, CCC-SLP Speech Language Pathologist   Georgetta Haber  07/01/2023, 3:02 PM

## 2023-07-02 DIAGNOSIS — J9601 Acute respiratory failure with hypoxia: Secondary | ICD-10-CM | POA: Diagnosis not present

## 2023-07-02 LAB — CBC
HCT: 39.8 % (ref 36.0–46.0)
Hemoglobin: 13.3 g/dL (ref 12.0–15.0)
MCH: 27.8 pg (ref 26.0–34.0)
MCHC: 33.4 g/dL (ref 30.0–36.0)
MCV: 83.3 fL (ref 80.0–100.0)
Platelets: 245 10*3/uL (ref 150–400)
RBC: 4.78 MIL/uL (ref 3.87–5.11)
RDW: 14.6 % (ref 11.5–15.5)
WBC: 29.1 10*3/uL — ABNORMAL HIGH (ref 4.0–10.5)
nRBC: 0 % (ref 0.0–0.2)

## 2023-07-02 LAB — COMPREHENSIVE METABOLIC PANEL
ALT: 17 U/L (ref 0–44)
AST: 19 U/L (ref 15–41)
Albumin: 2.2 g/dL — ABNORMAL LOW (ref 3.5–5.0)
Alkaline Phosphatase: 104 U/L (ref 38–126)
Anion gap: 10 (ref 5–15)
BUN: 68 mg/dL — ABNORMAL HIGH (ref 8–23)
CO2: 29 mmol/L (ref 22–32)
Calcium: 9.3 mg/dL (ref 8.9–10.3)
Chloride: 101 mmol/L (ref 98–111)
Creatinine, Ser: 1.56 mg/dL — ABNORMAL HIGH (ref 0.44–1.00)
GFR, Estimated: 37 mL/min — ABNORMAL LOW (ref >60)
Glucose, Bld: 201 mg/dL — ABNORMAL HIGH (ref 70–99)
Potassium: 3.1 mmol/L — ABNORMAL LOW (ref 3.5–5.1)
Sodium: 140 mmol/L (ref 135–145)
Total Bilirubin: 1.2 mg/dL (ref 0.3–1.2)
Total Protein: 6.4 g/dL — ABNORMAL LOW (ref 6.5–8.1)

## 2023-07-02 LAB — PHOSPHORUS: Phosphorus: 2.5 mg/dL (ref 2.5–4.6)

## 2023-07-02 LAB — MAGNESIUM: Magnesium: 2 mg/dL (ref 1.7–2.4)

## 2023-07-02 LAB — GLUCOSE, CAPILLARY: Glucose-Capillary: 191 mg/dL — ABNORMAL HIGH (ref 70–99)

## 2023-07-02 MED ORDER — POTASSIUM CHLORIDE CRYS ER 20 MEQ PO TBCR
40.0000 meq | EXTENDED_RELEASE_TABLET | Freq: Once | ORAL | Status: AC
  Administered 2023-07-02: 40 meq via ORAL
  Filled 2023-07-02: qty 2

## 2023-07-02 MED ORDER — LANTUS 100 UNIT/ML ~~LOC~~ SOLN: 40.0000 [IU] | Freq: Every day | SUBCUTANEOUS | 11 refills | Status: DC

## 2023-07-02 MED ORDER — AMOXICILLIN-POT CLAVULANATE 875-125 MG PO TABS: 1.0000 | ORAL_TABLET | Freq: Two times a day (BID) | ORAL | 0 refills | Status: AC

## 2023-07-02 MED ORDER — CHLORHEXIDINE GLUCONATE CLOTH 2 % EX PADS
6.0000 | MEDICATED_PAD | Freq: Every day | CUTANEOUS | Status: DC
  Administered 2023-07-02: 6 via TOPICAL

## 2023-07-02 MED ORDER — BREO ELLIPTA 100-25 MCG/ACT IN AEPB: 1.0000 | INHALATION_SPRAY | Freq: Every day | RESPIRATORY_TRACT | 5 refills | Status: DC

## 2023-07-02 MED ORDER — POTASSIUM CHLORIDE CRYS ER 20 MEQ PO TBCR
40.0000 meq | EXTENDED_RELEASE_TABLET | ORAL | Status: DC
  Administered 2023-07-02: 40 meq via ORAL
  Filled 2023-07-02: qty 2

## 2023-07-02 MED ORDER — DOXYCYCLINE HYCLATE 100 MG PO TABS: 100.0000 mg | ORAL_TABLET | Freq: Two times a day (BID) | ORAL | 0 refills | Status: DC

## 2023-07-02 MED ORDER — NOVOLOG 100 UNIT/ML IJ SOLN: 14.0000 [IU] | Freq: Three times a day (TID) | INTRAMUSCULAR | 11 refills | Status: DC

## 2023-07-02 MED ORDER — PREDNISONE 20 MG PO TABS: 20.0000 mg | ORAL_TABLET | Freq: Every day | ORAL | 0 refills | Status: AC

## 2023-07-02 NOTE — Plan of Care (Signed)
Problem: Education: Goal: Ability to demonstrate management of disease process will improve 07/02/2023 1109 by Luciana Axe, Lorella Nimrod, RN Outcome: Adequate for Discharge 07/02/2023 1000 by Luciana Axe, Lorella Nimrod, RN Outcome: Progressing Goal: Ability to verbalize understanding of medication therapies will improve 07/02/2023 1109 by Luciana Axe, Lorella Nimrod, RN Outcome: Adequate for Discharge 07/02/2023 1000 by Luciana Axe, Lorella Nimrod, RN Outcome: Progressing Goal: Individualized Educational Video(s) 07/02/2023 1109 by Luciana Axe, Lorella Nimrod, RN Outcome: Adequate for Discharge 07/02/2023 1000 by Luciana Axe, Lorella Nimrod, RN Outcome: Progressing   Problem: Activity: Goal: Capacity to carry out activities will improve 07/02/2023 1109 by Luciana Axe, Lorella Nimrod, RN Outcome: Adequate for Discharge 07/02/2023 1000 by Luciana Axe, Lorella Nimrod, RN Outcome: Progressing   Problem: Cardiac: Goal: Ability to achieve and maintain adequate cardiopulmonary perfusion will improve 07/02/2023 1109 by Luciana Axe, Lorella Nimrod, RN Outcome: Adequate for Discharge 07/02/2023 1000 by Luciana Axe, Lorella Nimrod, RN Outcome: Progressing   Problem: Education: Goal: Ability to demonstrate management of disease process will improve 07/02/2023 1109 by Luciana Axe, Lorella Nimrod, RN Outcome: Adequate for Discharge 07/02/2023 1000 by Luciana Axe, Lorella Nimrod, RN Outcome: Progressing Goal: Ability to verbalize understanding of medication therapies will improve 07/02/2023 1109 by Luciana Axe, Lorella Nimrod, RN Outcome: Adequate for Discharge 07/02/2023 1000 by Luciana Axe, Lorella Nimrod, RN Outcome: Progressing Goal: Individualized Educational Video(s) 07/02/2023 1109 by Luciana Axe, Lorella Nimrod, RN Outcome: Adequate for Discharge 07/02/2023 1000 by Luciana Axe, Lorella Nimrod, RN Outcome: Progressing   Problem: Activity: Goal: Capacity to carry out activities will improve 07/02/2023 1109 by Luciana Axe, Lorella Nimrod, RN Outcome:  Adequate for Discharge 07/02/2023 1000 by Luciana Axe, Lorella Nimrod, RN Outcome: Progressing   Problem: Cardiac: Goal: Ability to achieve and maintain adequate cardiopulmonary perfusion will improve 07/02/2023 1109 by Luciana Axe, Lorella Nimrod, RN Outcome: Adequate for Discharge 07/02/2023 1000 by Luciana Axe, Lorella Nimrod, RN Outcome: Progressing   Problem: Activity: Goal: Ability to tolerate increased activity will improve 07/02/2023 1109 by Luciana Axe, Lorella Nimrod, RN Outcome: Adequate for Discharge 07/02/2023 1000 by Luciana Axe, Lorella Nimrod, RN Outcome: Progressing   Problem: Clinical Measurements: Goal: Ability to maintain a body temperature in the normal range will improve 07/02/2023 1109 by Luciana Axe, Lorella Nimrod, RN Outcome: Adequate for Discharge 07/02/2023 1000 by Luciana Axe, Lorella Nimrod, RN Outcome: Progressing   Problem: Respiratory: Goal: Ability to maintain adequate ventilation will improve 07/02/2023 1109 by Luciana Axe, Lorella Nimrod, RN Outcome: Adequate for Discharge 07/02/2023 1000 by Luciana Axe, Lorella Nimrod, RN Outcome: Progressing Goal: Ability to maintain a clear airway will improve 07/02/2023 1109 by Luciana Axe, Lorella Nimrod, RN Outcome: Adequate for Discharge 07/02/2023 1000 by Luciana Axe, Lorella Nimrod, RN Outcome: Progressing   Problem: Education: Goal: Ability to describe self-care measures that may prevent or decrease complications (Diabetes Survival Skills Education) will improve 07/02/2023 1109 by Luciana Axe, Lorella Nimrod, RN Outcome: Adequate for Discharge 07/02/2023 1000 by Luciana Axe, Lorella Nimrod, RN Outcome: Progressing Goal: Individualized Educational Video(s) 07/02/2023 1109 by Luciana Axe, Lorella Nimrod, RN Outcome: Adequate for Discharge 07/02/2023 1000 by Luciana Axe, Lorella Nimrod, RN Outcome: Progressing   Problem: Coping: Goal: Ability to adjust to condition or change in health will improve 07/02/2023 1109 by Luciana Axe, Lorella Nimrod, RN Outcome: Adequate  for Discharge 07/02/2023 1000 by Luciana Axe, Lorella Nimrod, RN Outcome: Progressing   Problem: Fluid Volume: Goal: Ability to maintain a balanced intake and output will improve 07/02/2023 1109 by Luciana Axe, Lorella Nimrod,  RN Outcome: Adequate for Discharge 07/02/2023 1000 by Luciana Axe, Lorella Nimrod, RN Outcome: Progressing   Problem: Health Behavior/Discharge Planning: Goal: Ability to identify and utilize available resources and services will improve 07/02/2023 1109 by Luciana Axe, Lorella Nimrod, RN Outcome: Adequate for Discharge 07/02/2023 1000 by Luciana Axe, Lorella Nimrod, RN Outcome: Progressing Goal: Ability to manage health-related needs will improve 07/02/2023 1109 by Luciana Axe, Lorella Nimrod, RN Outcome: Adequate for Discharge 07/02/2023 1000 by Luciana Axe, Lorella Nimrod, RN Outcome: Progressing   Problem: Metabolic: Goal: Ability to maintain appropriate glucose levels will improve 07/02/2023 1109 by Luciana Axe, Lorella Nimrod, RN Outcome: Adequate for Discharge 07/02/2023 1000 by Luciana Axe, Lorella Nimrod, RN Outcome: Progressing   Problem: Nutritional: Goal: Maintenance of adequate nutrition will improve 07/02/2023 1109 by Luciana Axe, Lorella Nimrod, RN Outcome: Adequate for Discharge 07/02/2023 1000 by Luciana Axe, Lorella Nimrod, RN Outcome: Progressing Goal: Progress toward achieving an optimal weight will improve 07/02/2023 1109 by Luciana Axe, Lorella Nimrod, RN Outcome: Adequate for Discharge 07/02/2023 1000 by Luciana Axe, Lorella Nimrod, RN Outcome: Progressing   Problem: Skin Integrity: Goal: Risk for impaired skin integrity will decrease 07/02/2023 1109 by Luciana Axe, Lorella Nimrod, RN Outcome: Adequate for Discharge 07/02/2023 1000 by Luciana Axe, Lorella Nimrod, RN Outcome: Progressing   Problem: Tissue Perfusion: Goal: Adequacy of tissue perfusion will improve 07/02/2023 1109 by Luciana Axe, Lorella Nimrod, RN Outcome: Adequate for Discharge 07/02/2023 1000 by Luciana Axe, Lorella Nimrod, RN Outcome:  Progressing

## 2023-07-02 NOTE — Plan of Care (Signed)
  Problem: Education: °Goal: Ability to demonstrate management of disease process will improve °Outcome: Progressing °Goal: Ability to verbalize understanding of medication therapies will improve °Outcome: Progressing °Goal: Individualized Educational Video(s) °Outcome: Progressing °  °Problem: Activity: °Goal: Capacity to carry out activities will improve °Outcome: Progressing °  °Problem: Cardiac: °Goal: Ability to achieve and maintain adequate cardiopulmonary perfusion will improve °Outcome: Progressing °  °Problem: Education: °Goal: Ability to demonstrate management of disease process will improve °Outcome: Progressing °Goal: Ability to verbalize understanding of medication therapies will improve °Outcome: Progressing °Goal: Individualized Educational Video(s) °Outcome: Progressing °  °

## 2023-07-02 NOTE — Discharge Instructions (Signed)
1) please note that there were several adjustments to your insulin regimen--- 2)Avoid ibuprofen/Advil/Aleve/Motrin/Goody Powders/Naproxen/BC powders/Meloxicam/Diclofenac/Indomethacin and other Nonsteroidal anti-inflammatory medications as these will make you more likely to bleed and can cause stomach ulcers, can also cause Kidney problems.  3) repeat BMP blood test in 3 to 5 days 4)you need oxygen at home at 2 L via nasal cannula continuously while awake and while asleep--- smoking or having open fires around oxygen can cause fire, significant injury and death

## 2023-07-02 NOTE — TOC Transition Note (Signed)
Transition of Care [facility]) - CM/SW Discharge Note   Patient Details  Name: TITIANNA NOLDE MRN: [id number] Date of Birth: 02-27-1961  Transition of Care [facility]) CM/SW Contact:  Leitha Bleak, RN Phone Number: 07/02/2023, 11:48 AM   Clinical Narrative:   Patient returning to [facility], Rn calling report. TOC faxed updated DC summary to [facility]. TOC following to set up transportation.    Final next level of care: Long Term Nursing Home Barriers to Discharge: Barriers Resolved   Patient Goals and CMS Choice   Choice offered to / list presented to : Spouse  Discharge Placement                      Patient and family notified of of transfer: 07/02/23  Discharge Plan and Services Additional resources added to the After Visit Summary for   In-house Referral: Clinical Social Work   Post Acute Care Choice: Resumption of Svcs/PTA Provider              Social Determinants of Health (SDOH) Interventions SDOH Screenings   Food Insecurity: Patient Declined (08/12/2022)  Housing: Low Risk  (08/12/2022)  Transportation Needs: No Transportation Needs (08/12/2022)  Utilities: Not At Risk (08/12/2022)  Financial Resource Strain: Medium Risk (08/14/2021)   Received from [facility], [facility]  Physical Activity: Inactive (08/14/2021)   Received from [facility], [facility]  Social Connections: Unknown (04/08/2022)   Received from [facility], [facility]  Stress: Stress Concern Present (08/14/2021)   Received from [facility], [facility]  Tobacco Use: Low Risk  (06/14/2023)   Received from [facility]  Recent Concern: Tobacco Use - Medium Risk (03/21/2023)     Readmission Risk Interventions    06/27/2023    2:47 PM 12/17/2021   11:29 AM 11/23/2021    2:32 PM  Readmission Risk Prevention Plan  Transportation Screening Complete Complete Complete  Home Care Screening  Complete   Medication Review (RN CM)  Complete Complete  HRI or Home Care Consult  Complete    Social Work Consult for Recovery Care Planning/Counseling Complete    Palliative Care Screening Not Applicable    Medication Review Oceanographer) Complete

## 2023-07-02 NOTE — Discharge Summary (Addendum)
[name], is a 62 y.o. female  DOB [birth year]  MRN [id number].  Admission date:  06/27/2023  Admitting Physician  Vassie Loll, MD  Discharge Date:  07/02/2023   Primary MD  [name], NP  Recommendations for primary care physician for things to follow:   1) please note that there were several adjustments to your insulin regimen--- 2)Avoid ibuprofen/Advil/Aleve/Motrin/Goody Powders/Naproxen/BC powders/Meloxicam/Diclofenac/Indomethacin and other Nonsteroidal anti-inflammatory medications as these will make you more likely to bleed and can cause stomach ulcers, can also cause Kidney problems.  3) repeat BMP blood test in 3 to 5 days 4)you need oxygen at home at 2 L via nasal cannula continuously while awake and while asleep--- smoking or having open fires around oxygen can cause fire, significant injury and death  Admission Diagnosis  Acute respiratory failure with hypoxia (HCC) [J96.01] Multifocal pneumonia [J18.9]   Discharge Diagnosis  Acute respiratory failure with hypoxia (HCC) [J96.01] Multifocal pneumonia [J18.9]    Principal Problem:   Acute respiratory failure with hypoxia (HCC) Active Problems:   Diabetes mellitus (HCC)   Essential hypertension   COPD (chronic obstructive pulmonary disease) (HCC)   Depression   Prolonged QT interval   Acute renal failure superimposed on chronic kidney disease (HCC)   OSA (obstructive sleep apnea)   Morbid obesity with BMI of 50.0-59.9, adult (HCC)   Mixed hyperlipidemia   GERD (gastroesophageal reflux disease)   Acute on chronic diastolic CHF (congestive heart failure) (HCC)      Past Medical History:  Diagnosis Date   Anemia    Arthritis    Asthma    Cancer of sigmoid (HCC)    COPD (chronic obstructive pulmonary disease) (HCC)    Depression    Diabetes mellitus    Diastolic dysfunction 05/15/2015   Dyspnea    Generalized  weakness    Hyperlipidemia    Hypertension    Hypothyroidism    Obesity    Palpitations    Pneumonia    PONV (postoperative nausea and vomiting)    Prolonged QT interval 05/14/2015   Sleep apnea     Past Surgical History:  Procedure Laterality Date   BIOPSY  04/06/2022   Procedure: BIOPSY;  Surgeon: Dolores Frame, MD;  [facility];  Service: Gastroenterology;;   CATARACT EXTRACTION     CESAREAN SECTION     CHOLECYSTECTOMY     COLONOSCOPY WITH PROPOFOL N/A 04/06/2022   Procedure: COLONOSCOPY WITH PROPOFOL;  Surgeon: Dolores Frame, MD;  [facility]: [facility];  Service: Gastroenterology;  Laterality: N/A;  805 ASA 2 patient in [facility]   ESOPHAGOGASTRODUODENOSCOPY (EGD) WITH PROPOFOL N/A 04/06/2022   Procedure: ESOPHAGOGASTRODUODENOSCOPY (EGD) WITH PROPOFOL;  Surgeon: Dolores Frame, MD;  [facility];  Service: Gastroenterology;  Laterality: N/A;   HEMOSTASIS CLIP PLACEMENT  04/06/2022   Procedure: HEMOSTASIS CLIP PLACEMENT;  Surgeon: Dolores Frame, MD;  [facility];  Service: Gastroenterology;;   POLYPECTOMY  04/06/2022   Procedure: POLYPECTOMY;  Surgeon: Levon Hedger  Alisia Ferrari, MD;  [facility];  Service: Gastroenterology;;   SUBMUCOSAL TATTOO INJECTION  04/06/2022   Procedure: SUBMUCOSAL TATTOO INJECTION;  Surgeon: Marguerita Merles, Reuel Boom, MD;  [facility]: [facility];  Service: Gastroenterology;;     HPI  from the history and physical done on the day of admission:    HPI: [name] is a 62 y.o. 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   Patient's symptoms have been present since Friday (06/24/2023) and has been worsening throughout.  Initial chest x-ray and COVID test as an outpatient failed to demonstrate acute abnormalities.  Given further deterioration patient has been transferred to the emergency department for further treatment.  Workup demonstrating vascular congestion and new bilateral infiltrates suggestive of pneumonia along with acute on chronic heart failure. Patient require the use of BiPAP to stabilize oxygen saturation and ease of respiratory distress.  Patient has confirmed to be DNR and to not one intubation if required.   In the ED cultures taken, antibiotics given and TRH consulted to place patient in the hospital for further evaluation and management.   Of note, patient denies abdominal pain, chest pain, nausea, vomiting, dysuria, hematuria, melena, hematochezia or focal weaknesses.   Review of Systems: As mentioned in the history of present illness. All other systems reviewed and are negative.   Hospital Course:     1)Acute on chronic respiratory failure with hypoxia -- -Patient did not use oxygen at baseline -Acute hypoxemic respiratory failure appears to be multifactorial in the setting of COPD exacerbation, CHF exacerbation and multifocal pneumonia.   -Flutter valve and incentive spirometer provided. -Will continue BiPAP nightly (patient body habitus and prior history suggesting presence of obesity hypoventilation syndrome-most likely OSA) and continue to wean off oxygen supplementation as tolerated. -Continue daily weights, strict I's and O's and IV diuresis -Follow clinical response. -Patient has confirmed DNR. -as per husband pt has been on oxygen on and off at [facility] over last couple of months  -Clinically much improved  -Treated with Unasyn and bronchodilators -Treated with IV Solu-Medrol -Okay to discharge on prednisone and augmentin  2)Acute COPD exacerbation------ steroids, flutter  valve, antibiotics and bronchodilators and mucolytics as above #1 -Overall much improved   3)Acute on chronic diastolic CHF (congestive heart failure) (HCC) -Patient with vascular congestion appreciated on x-ray and an elevated BNP -Echo from 06/27/2023 with EF of 60-65 patient with grade 1 diastolic dysfunction = Overall much improved  4)Bacteremia---Blood cultures Positive for Haemophilus influenza and Beta Lactamase -Treated with IV Unasyn = Okay to discharge on Augmentin   5)Acute renal failure superimposed on chronic kidney disease (HCC) -Creatinine currently down to 1.56 -Baseline creatinine is between 1.3-1.5 -Avoid hypotension, minimize/avoid the use of contrast and closely follow renal function while diuresing patient -Continue strict I's and O's.   GERD (gastroesophageal reflux disease) -Continue PPI.   Mixed hyperlipidemia -Continue statin   Morbid obesity with BMI of 50.0-59.9, adult (HCC) -Body mass index is 54.11 kg/m. -Low-calorie diet, portion control and increase physical activity discussed with patient.   OSA (obstructive sleep apnea) -Continue the use of BiPAP nightly.   Prolonged QT interval -Continue telemetry monitoring. -replete electrolytes as needed and minimize the use of medications that can further prolong QT.  Depression -Stable mood/flat affect -Continue home antidepressant management. -No hallucinations and no suicidal ideation currently.   Essential hypertension -Continue amlodipine 5 mg daily -Hydralazine 25 mg 3 times daily -Heart healthy diet ordered.   Diabetes mellitus (HCC) -A1c 8.5 -Persistent steroid induced hypoglycemia noted --Control should improve with tapering of steroids -Insulin regimen adjusted    Social/Ethics--- Has been in SNF since 11/2021, transferred to [facility] 11/2022 Plan of care and advised directive discussed with husband -Patient remains a DNR/DNI -At baseline she is usually able to ambulate few  feet with a walker   Hypothyroidism--continue levothyroxine  Hypokalemia--- replaced -BMP in 3 to 5 days as outpatient  Leukocytosis--- WBC continues to trend up in the setting of steroid use  -I believe this is steroid-induced leukocytosis -Clinically patient is much better overall   Discharge Condition: stable  Follow UP   Follow-up Information     [name], Ruby Cola, NP. Schedule an appointment as soon as possible for a visit in 1 week(s).   Specialty: Adult Health Nurse Practitioner Contact information: [address] [place] [address] [phone]                 Diet and Activity recommendation:  As advised  Discharge Instructions   * Discharge Instructions     Diet - low sodium heart healthy   Complete by: As directed    Discharge instructions   Complete by: As directed    1) please note that there were several adjustments to your insulin regimen--- 2)Avoid ibuprofen/Advil/Aleve/Motrin/Goody Powders/Naproxen/BC powders/Meloxicam/Diclofenac/Indomethacin and other Nonsteroidal anti-inflammatory medications as these will make you more likely to bleed and can cause stomach ulcers, can also cause Kidney problems.  3) repeat BMP blood test in 3 to 5 days 4)you need oxygen at home at 2 L via nasal cannula continuously while awake and while asleep--- smoking or having open fires around oxygen can cause fire, significant injury and death   Increase activity slowly   Complete by: As directed          Discharge Medications     Allergies as of 07/02/2023       Reactions   Carvedilol Palpitations   Benicar [olmesartan] Swelling   Codeine Other (See Comments)   Confusion    Sulfa Antibiotics Swelling   Whole face swells   Trulicity [dulaglutide] Diarrhea   Ceftriaxone Hives, Rash        Medication List     TAKE these medications    Admelog 100 UNIT/ML injection Generic drug: insulin lispro Inject 4-8 Units into the skin 3  (three) times daily with meals. If blood glucose is 200-250= 4 units, 250-300= 5 units, 301-350= 6 units, 351-400= 8 units, > 400 notify MD   albuterol 108 (90 Base) MCG/ACT inhaler Commonly known as: VENTOLIN HFA Inhale 2 puffs into the lungs every 6 (six) hours as needed for wheezing or shortness of breath.   alum & mag hydroxide-simeth 200-200-20 MG/5ML suspension Commonly known as: MAALOX/MYLANTA Take 30 mLs by mouth every 2 (two) hours as needed for indigestion or heartburn. Do not exceed 4 doses in 24 hours   amLODipine 5 MG tablet Commonly known as: NORVASC Take 5 mg by mouth daily.   amoxicillin-clavulanate 875-125 MG tablet Commonly known as: AUGMENTIN Take 1 tablet by mouth 2 (two) times daily for 5 days.   ascorbic acid 500 MG tablet Commonly known as: VITAMIN C Take 500 mg by mouth daily.   aspirin EC 81 MG tablet  Take 81 mg by mouth daily. Swallow whole.   atorvastatin 10 MG tablet Commonly known as: LIPITOR Take 10 mg by mouth at bedtime.   Biotin 10 MG Tabs Take 1 tablet by mouth daily.   Breo Ellipta 100-25 MCG/ACT Aepb Generic drug: fluticasone furoate-vilanterol Inhale 1 puff into the lungs daily.   Calmoseptine 0.44-20.6 % Oint Generic drug: Menthol-Zinc Oxide Apply 1 Application topically as needed (preservation/protection after incontinent care).   cetirizine 10 MG tablet Commonly known as: ZYRTEC Take 10 mg by mouth daily.   Cranberry 600 MG Tabs Take 300 mg by mouth 2 (two) times daily. Half tab bid   docusate sodium 100 MG capsule Commonly known as: COLACE Take 100 mg by mouth daily.   eucerin lotion Apply 1 Application topically every 12 (twelve) hours as needed for dry skin.   ferrous sulfate 325 (65 FE) MG tablet Take 325 mg by mouth daily.   furosemide 20 MG tablet Commonly known as: LASIX Take 20 mg by mouth daily.   gabapentin 300 MG capsule Commonly known as: NEURONTIN Take 1 capsule in AM, 2 capsules in PM What changed:   how much to take how to take this when to take this   Glucerna Liqd Take 237 mLs by mouth daily. For wound healing   hydrALAZINE 25 MG tablet Commonly known as: APRESOLINE Take 1 tablet (25 mg total) by mouth every 8 (eight) hours.   lansoprazole 30 MG capsule Commonly known as: PREVACID Take 30 mg by mouth 2 (two) times daily before a meal.   Lantus 100 UNIT/ML injection Generic drug: insulin glargine Inject 0.4 mLs (40 Units total) into the skin at bedtime. What changed: how much to take   levothyroxine 88 MCG tablet Commonly known as: SYNTHROID Take 88 mcg by mouth daily before breakfast.   Myrbetriq 50 MG Tb24 tablet Generic drug: mirabegron ER Take 50 mg by mouth daily.   NovoLOG 100 UNIT/ML injection Generic drug: insulin aspart Inject 14 Units into the skin 3 (three) times daily with meals. Hold if resident is not eating or if BG is less than 150 What changed: how much to take   ondansetron 4 MG tablet Commonly known as: ZOFRAN Take 8 mg by mouth 2 (two) times daily.   potassium chloride SA 20 MEQ tablet Commonly known as: KLOR-CON M Take 20 mEq by mouth daily.   predniSONE 20 MG tablet Commonly known as: DELTASONE Take 1 tablet (20 mg total) by mouth daily with breakfast for 5 days.   Procrit 4000 UNIT/ML injection Generic drug: epoetin alfa Inject 4,000 Units into the skin every Monday, Wednesday, and Friday. Hold if hemoglobin is > 10   propranolol 80 MG tablet Commonly known as: INDERAL Take 80 mg by mouth daily.   saccharomyces boulardii 250 MG capsule Commonly known as: FLORASTOR Take 250 mg by mouth 2 (two) times daily.   Santyl 250 UNIT/GM ointment Generic drug: collagenase Apply 1 Application topically daily.   sertraline 100 MG tablet Commonly known as: ZOLOFT Take 100 mg by mouth daily.   Vitamin D 50 MCG (2000 UT) Caps Take 2,000 Units by mouth daily.        Major procedures and Radiology Reports - PLEASE review detailed and  final reports for all details, in brief -   DG CHEST PORT 1 VIEW  Result Date: 07/01/2023 CLINICAL DATA:  Dyspnea and respiratory abnormalities. EXAM: PORTABLE CHEST 1 VIEW COMPARISON:  One-view chest x-ray 06/27/2023 FINDINGS: The heart is enlarged. Previously  seen airspace consolidation is improved. More patchy right perihilar and left lower lobe opacities remain. Mild pulmonary vascular congestion is present. IMPRESSION: 1. Cardiomegaly and mild pulmonary vascular congestion. 2. Improving bilateral airspace disease. Electronically Signed   By: Marin Roberts M.D.   On: 07/01/2023 09:12   CT CHEST WO CONTRAST  Result Date: 06/27/2023 CLINICAL DATA:  Indeterminate opacities both lung bases on portable chest today. Complains of shortness of breath, suspected pneumonia, with hypoxia. Complication is suspected. EXAM: CT CHEST WITHOUT CONTRAST TECHNIQUE: Multidetector CT imaging of the chest was performed following the standard protocol without IV contrast. RADIATION DOSE REDUCTION: This exam was performed according to the departmental dose-optimization program which includes automated exposure control, adjustment of the mA and/or kV according to patient size and/or use of iterative reconstruction technique. COMPARISON:  Portable chest today, chest, abdomen and pelvis CT with contrast 10/13/2022, chest, abdomen and pelvis CT with contrast 04/27/2022. FINDINGS: Cardiovascular: There is mild cardiomegaly. Calcification in the inferior mitral ring. Minimal pericardial effusion chronically noted. There are three-vessel coronary artery calcifications greatest in the LAD. There is aortic atherosclerosis without aneurysmal dilatation. Slightly prominent pulmonary trunk measuring 3 cm, unchanged with normal caliber pulmonary veins. There is normal great vessel branching. Mediastinum/Nodes: No visible thyroid nodule. Axillary spaces are clear. Small hiatal hernia with unremarkable thoracic esophagus. There have  developed slightly prominent lymph nodes in the right paratracheal, AP window lymph and subcarinal spaces, largest is 1 cm in short axis. Limited evaluation of the hila without contrast but no contour deforming mass. There is scattered fluid in the posterior trachea, fluid levels in both main bronchi concerning for aspiration. Lungs/Pleura: There is additional fluid in the left lower lobe main bronchus. Diffuse bronchial thickening is increased. There is extensive dense consolidation in both lower lobes, scattered areas of airspace disease in the anterior left upper lobe extending to the lingula, and additional dense consolidation in the posterior segment of the left upper lobe with small perihilar infiltrates in the right upper lobe. Findings are most likely due to multilobar pneumonia with probable aspiration component. Aspiration precautions are recommended. Underlying airspace edema component is not excluded. There are small pleural effusions. Right middle lobe is clear. Upper Abdomen: Old cholecystectomy. Mild hepatic steatosis. Splenomegaly stable with splenic AP dimension of 15.2 cm. No acute upper abdominal abnormality. Musculoskeletal: Osteopenia, degenerative change of the thoracic spine and mild thoracic kyphosis. No acute or other significant osseous findings. The visualized chest wall unremarkable. IMPRESSION: 1. Extensive dense consolidation in both lower lobes, with additional airspace disease in the left-greater-than-right upper lobes. Findings are most likely due to multilobar pneumonia with probable aspiration component. Aspiration precautions recommended. Underlying edema component not excluded. 2. Small pleural effusions. 3. Fluid in the posterior trachea, main bronchi and left lower lobe main bronchus. 4. Increased bronchial thickening. 5. Aortic and coronary artery atherosclerosis. 6. Stable splenomegaly. 7. Hepatic steatosis. Aortic Atherosclerosis (ICD10-I70.0). Electronically Signed   By:  Almira Bar M.D.   On: 06/27/2023 21:13   ECHOCARDIOGRAM COMPLETE  Result Date: 06/27/2023    ECHOCARDIOGRAM REPORT   Patient Name:   [name] Date of Exam: 06/27/2023 Medical Rec #:  [id number]           Height:       61.0 in Accession #:    [id number]          Weight:       286.4 lb Date of Birth:  Aug 07, 1961  BSA:          2.200 m Patient Age:    62 years            BP:           121/65 mmHg Patient Gender: F                   HR:           93 bpm. [facility] Procedure: 2D Echo, Cardiac Doppler, Color Doppler and Intracardiac            Opacification Agent Indications:    I50.40* Unspecified combined systolic (congestive) and diastolic                 (congestive) heart failure  History:        Patient has prior history of Echocardiogram examinations, most                 recent 05/15/2015. Abnormal ECG, COPD, Signs/Symptoms:Shortness of                 Breath and Dyspnea; Risk Factors:Diabetes, Sleep Apnea and                 Dyslipidemia. Cancer.  Sonographer:    Sheralyn Boatman RDCS Referring Phys: [phone] [facility]  Sonographer Comments: Technically difficult study due to poor echo windows and patient is obese. Image acquisition challenging due to patient body habitus. IMPRESSIONS  1. Very poor acoustic windows limit study, even with Definity use.  2. Left ventricular ejection fraction, by estimation, is 60 to 65%. The left ventricle has normal function. The left ventricle has no regional wall motion abnormalities. There is severe asymmetric left ventricular hypertrophy. Left ventricular diastolic  parameters are consistent with Grade I diastolic dysfunction (impaired relaxation).  3. Right ventricular systolic function is normal. The right ventricular size is normal.  4. No evidence of mitral valve regurgitation. No evidence of mitral stenosis.  5. The aortic valve is grossly normal. Aortic valve regurgitation is not visualized. FINDINGS  Left Ventricle: Left ventricular  ejection fraction, by estimation, is 60 to 65%. The left ventricle has normal function. The left ventricle has no regional wall motion abnormalities. Definity contrast agent was given IV to delineate the left ventricular  endocardial borders. The left ventricular internal cavity size was normal in size. There is severe asymmetric left ventricular hypertrophy. Left ventricular diastolic parameters are consistent with Grade I diastolic dysfunction (impaired relaxation). Right Ventricle: The right ventricular size is normal. Right vetricular wall thickness was not assessed. Right ventricular systolic function is normal. Left Atrium: Left atrial size was normal in size. Right Atrium: Right atrial size was normal in size. Pericardium: There is no evidence of pericardial effusion. Mitral Valve: Mild mitral annular calcification. No evidence of mitral valve regurgitation. No evidence of mitral valve stenosis. Tricuspid Valve: The tricuspid valve is grossly normal. Tricuspid valve regurgitation is trivial. No evidence of tricuspid stenosis. Aortic Valve: The aortic valve is grossly normal. Aortic valve regurgitation is not visualized. Pulmonic Valve: The pulmonic valve was not well visualized. Pulmonic valve regurgitation is not visualized. No evidence of pulmonic stenosis. Aorta: The aortic root and ascending aorta are structurally normal, with no evidence of dilitation. IAS/Shunts: The interatrial septum was not well visualized.  LEFT VENTRICLE PLAX 2D LVIDd:         3.70 cm     Diastology LVIDs:         2.80 cm  LV e' medial:    5.77 cm/s LV PW:         1.72 cm     LV E/e' medial:  17.4 LV IVS:        1.30 cm     LV e' lateral:   4.68 cm/s LVOT diam:     2.10 cm     LV E/e' lateral: 21.4 LV SV:         60 LV SV Index:   27 LVOT Area:     3.46 cm  LV Volumes (MOD) LV vol d, MOD A2C: 58.9 ml LV vol d, MOD A4C: 56.2 ml LV vol s, MOD A2C: 16.2 ml LV vol s, MOD A4C: 22.7 ml LV SV MOD A2C:     42.7 ml LV SV MOD A4C:      56.2 ml LV SV MOD BP:      38.3 ml RIGHT VENTRICLE RV S prime:     13.20 cm/s TAPSE (M-mode): 2.3 cm LEFT ATRIUM             Index        RIGHT ATRIUM           Index LA diam:        4.20 cm 1.91 cm/m   RA Area:     15.10 cm LA Vol (A2C):   32.9 ml 14.95 ml/m  RA Volume:   38.70 ml  17.59 ml/m LA Vol (A4C):   29.5 ml 13.41 ml/m LA Biplane Vol: 32.5 ml 14.77 ml/m  AORTIC VALVE LVOT Vmax:   106.00 cm/s LVOT Vmean:  67.200 cm/s LVOT VTI:    0.174 m  AORTA Ao Root diam: 3.00 cm Ao Asc diam:  3.20 cm MITRAL VALVE MV Area (PHT): 4.74 cm     SHUNTS MV Decel Time: 160 msec     Systemic VTI:  0.17 m MV E velocity: 100.15 cm/s  Systemic Diam: 2.10 cm MV A velocity: 113.50 cm/s MV E/A ratio:  0.88 Dietrich Pates MD Electronically signed by Dietrich Pates MD Signature Date/Time: 06/27/2023/4:37:23 PM    Final    DG Chest Port 1 View  Result Date: 06/27/2023 CLINICAL DATA:  Shortness of breath EXAM: PORTABLE CHEST 1 VIEW COMPARISON:  12/15/2021 x-ray FINDINGS: Enlarged cardiopericardial silhouette. Tortuous and ectatic aorta. No pneumothorax or effusion. No edema. Mild peribronchial thickening. There are rounded areas of opacity seen along both lung bases. Infiltrate is possible versus a mass lesion. Recommend further workup with CT when appropriate. IMPRESSION: Enlarged cardiopericardial silhouette with tortuous ectatic aorta. Rounded areas of opacity along both lung bases. Infiltrate versus mass lesion is possible. Recommend follow-up CT scan when appropriate Electronically Signed   By: Karen Kays M.D.   On: 06/27/2023 10:39   US RENAL  Result Date: 06/07/2023 CLINICAL DATA:  Chronic renal disease EXAM: RENAL / URINARY TRACT ULTRASOUND COMPLETE COMPARISON:  CT AP 10/13/2022 FINDINGS: Right Kidney: Renal measurements: 13.0 x 6.6 x 6.2 cm = volume: 280 mL. Echogenicity within normal limits. No mass or hydronephrosis visualized. There is an exophytic 2.4 cm cyst. No imaging follow-up needed. Left Kidney: Renal  measurements: 11.8 x 7.8 x 5.2 cm = volume: 256 mL. Echogenicity within normal limits. No mass or hydronephrosis visualized. Bladder: Appears normal for degree of bladder distention. Other: None. IMPRESSION: No hydronephrosis. Electronically Signed   By: Annia Belt M.D.   On: 06/07/2023 15:07    Micro Results    Recent Results (from the past 240 hour(s))  SARS Coronavirus 2 by RT PCR (hospital order, performed in [facility] hospital lab) *cepheid single result test* Anterior Nasal Swab     Status: None   Collection Time: 06/27/23  9:32 AM   Specimen: Anterior Nasal Swab  Result Value Ref Range Status   SARS Coronavirus 2 by RT PCR NEGATIVE NEGATIVE Final    Comment: (NOTE) SARS-CoV-2 target nucleic acids are NOT DETECTED.  The SARS-CoV-2 RNA is generally detectable in upper and lower respiratory specimens during the acute phase of infection. The lowest concentration of SARS-CoV-2 viral copies this assay can detect is 250 copies / mL. A negative result does not preclude SARS-CoV-2 infection and should not be used as the sole basis for treatment or other patient management decisions.  A negative result may occur with improper specimen collection / handling, submission of specimen other than nasopharyngeal swab, presence of viral mutation(s) within the areas targeted by this assay, and inadequate number of viral copies (<250 copies / mL). A negative result must be combined with clinical observations, patient history, and epidemiological information.  Fact Sheet for Patients:   RoadLapTop.co.za  Fact Sheet for Healthcare Providers: http://kim-miller.com/  This test is not yet approved or  cleared by the [place] FDA and has been authorized for detection and/or diagnosis of SARS-CoV-2 by FDA under an Emergency Use Authorization (EUA).  This EUA will remain in effect (meaning this test can be used) for the duration of the COVID-19  declaration under Section 564(b)(1) of the Act, 21 U.S.C. section 360bbb-3(b)(1), unless the authorization is terminated or revoked sooner.  Performed at [facility], [address] [place], [place] [address]   Culture, blood (Routine X 2) w Reflex to ID Panel     Status: Abnormal (Preliminary result)   Collection Time: 06/27/23  9:35 AM   Specimen: BLOOD LEFT HAND  Result Value Ref Range Status   Specimen Description   Final    BLOOD LEFT HAND Performed at [facility], [address], [place], [place] [address]    Special Requests   Final    BOTTLES DRAWN AEROBIC AND ANAEROBIC Blood Culture adequate volume Performed at [facility], [address] [place], [place] [address]    Culture  Setup Time   Final    GRAM NEGATIVE RODS IN BOTH AEROBIC AND ANAEROBIC BOTTLES CRITICAL RESULT CALLED TO, READ BACK BY AND VERIFIED WITH: R ESTOCE AT 3710 06/28/2023 BY T KENNEDY CRITICAL RESULT CALLED TO, READ BACK BY AND VERIFIED WITH: PHARM STEVEN H. [id number] [id number] FCP    Culture (A)  Final    HAEMOPHILUS INFLUENZAE BETA LACTAMASE POSITIVE HEALTH DEPARTMENT NOTIFIED Referred to [facility] in [place], [place] for serotyping. Performed at [facility], [address], [place], [place] [address]    Report Status PENDING  Incomplete  Blood Culture ID Panel (Reflexed)     Status: Abnormal   Collection Time: 06/27/23  9:35 AM  Result Value Ref Range Status   Enterococcus faecalis NOT DETECTED NOT DETECTED Final   Enterococcus Faecium NOT DETECTED NOT DETECTED Final   Listeria monocytogenes NOT DETECTED NOT DETECTED Final   Staphylococcus species NOT DETECTED NOT DETECTED Final   Staphylococcus aureus (BCID) NOT DETECTED NOT DETECTED Final   Staphylococcus epidermidis NOT DETECTED NOT DETECTED Final   Staphylococcus lugdunensis NOT DETECTED NOT DETECTED Final   Streptococcus species NOT DETECTED NOT DETECTED Final   Streptococcus agalactiae NOT DETECTED NOT DETECTED Final    Streptococcus pneumoniae NOT DETECTED NOT  DETECTED Final   Streptococcus pyogenes NOT DETECTED NOT DETECTED Final   A.calcoaceticus-baumannii NOT DETECTED NOT DETECTED Final   Bacteroides fragilis NOT DETECTED NOT DETECTED Final   Enterobacterales NOT DETECTED NOT DETECTED Final   Enterobacter cloacae complex NOT DETECTED NOT DETECTED Final   Escherichia coli NOT DETECTED NOT DETECTED Final   Klebsiella aerogenes NOT DETECTED NOT DETECTED Final   Klebsiella oxytoca NOT DETECTED NOT DETECTED Final   Klebsiella pneumoniae NOT DETECTED NOT DETECTED Final   Proteus species NOT DETECTED NOT DETECTED Final   Salmonella species NOT DETECTED NOT DETECTED Final   Serratia marcescens NOT DETECTED NOT DETECTED Final   Haemophilus influenzae DETECTED (A) NOT DETECTED Final    Comment: CRITICAL RESULT CALLED TO, READ BACK BY AND VERIFIED WITH: PHARM STEVEN H. 1004 [id number] FCP    Neisseria meningitidis NOT DETECTED NOT DETECTED Final   Pseudomonas aeruginosa NOT DETECTED NOT DETECTED Final   Stenotrophomonas maltophilia NOT DETECTED NOT DETECTED Final   Candida albicans NOT DETECTED NOT DETECTED Final   Candida auris NOT DETECTED NOT DETECTED Final   Candida glabrata NOT DETECTED NOT DETECTED Final   Candida krusei NOT DETECTED NOT DETECTED Final   Candida parapsilosis NOT DETECTED NOT DETECTED Final   Candida tropicalis NOT DETECTED NOT DETECTED Final   Cryptococcus neoformans/gattii NOT DETECTED NOT DETECTED Final    Comment: Performed at [facility], [address], [place], [place] [address]  Culture, blood (Routine X 2) w Reflex to ID Panel     Status: Abnormal   Collection Time: 06/27/23  9:52 AM   Specimen: BLOOD  Result Value Ref Range Status   Specimen Description   Final    BLOOD LEFT ANTECUBITAL Performed at [facility], [address], [place], [place] [address]    Special Requests   Final    BOTTLES DRAWN AEROBIC AND ANAEROBIC Blood Culture results may not be optimal due to  an excessive volume of blood received in culture bottles Performed at [facility], [address] [place], [place] [address]    Culture  Setup Time   Final    GRAM NEGATIVE RODS IN BOTH AEROBIC AND ANAEROBIC BOTTLES CRITICAL RESULT CALLED TO, READ BACK BY AND VERIFIED WITH: R ESTOCE AT 0102 06/28/2023 BY T KENNEDY CRITICAL VALUE NOTED.  VALUE IS CONSISTENT WITH PREVIOUSLY REPORTED AND CALLED VALUE.    Culture (A)  Final    HAEMOPHILUS INFLUENZAE BETA LACTAMASE POSITIVE Performed at [facility], [address], [place], [place] [address]    Report Status 06/30/2023 FINAL  Final  Culture, blood (Routine X 2) w Reflex to ID Panel     Status: Abnormal   Collection Time: 06/27/23 11:59 AM   Specimen: BLOOD  Result Value Ref Range Status   Specimen Description   Final    BLOOD RIGHT ANTECUBITAL Performed at [facility], [address] [place], [place] [address]    Special Requests   Final    BOTTLES DRAWN AEROBIC AND ANAEROBIC Blood Culture adequate volume Performed at [facility], [address], [place], [place] [address]    Culture  Setup Time   Final    GRAM NEGATIVE RODS IN BOTH AEROBIC AND ANAEROBIC BOTTLES Gram Stain Report Called to,Read Back By and Verified With: Melburn Popper @ [phone] ON 06/28/2023 BY FRATTO,ASHLAEY Performed at [facility], [address], [place], [place] [address]    Culture (A)  Final    HAEMOPHILUS INFLUENZAE BETA LACTAMASE POSITIVE Performed at [facility], [address], [place], [place] [address]    Report Status 06/30/2023 FINAL  Final  MRSA Next Gen by PCR, Nasal     Status: Abnormal   Collection Time: 06/27/23  6:00 PM   Specimen: Nasal Mucosa; Nasal Swab  Result Value Ref Range Status   MRSA by PCR Next Gen DETECTED (A) NOT DETECTED Final    Comment: RESULT CALLED TO, READ BACK BY AND VERIFIED WITHArdis Rowan RN [phone] [facility] (NOTE) The GeneXpert MRSA Assay (FDA approved for NASAL specimens only), is one component of a  comprehensive MRSA colonization surveillance program. It is not intended to diagnose MRSA infection nor to guide or monitor treatment for MRSA infections. Test performance is not FDA approved in patients less than 32 years old. Performed at [facility], [address] [place], [place] [address]     Today   Subjective    Kimmie Founds today has no complaints -Awake, talkative, eating and drinking well -Voiding well          Patient has been seen and examined prior to discharge   Objective   Blood pressure 132/67, pulse (!) 59, temperature 98.2 F (36.8 C), temperature source Oral, resp. rate 19, height 5\' 1"  (1.549 m), weight 124.5 kg, last menstrual period 08/06/2012, SpO2 95%.   Intake/Output Summary (Last 24 hours) at 07/02/2023 1124 Last data filed at 07/02/2023 1000 Gross per 24 hour  Intake 662.33 ml  Output 1850 ml  Net -1187.67 ml    Exam Gen:-Awake, morbidly obese, no acute distress, no conversational dyspnea HEENT:- [place].AT, No sclera icterus Nose- 2L/min Neck-Supple Neck,No JVD,.  Lungs-improved air movement, no wheezing CV- S1, S2 normal, RRR Abd-  +ve B.Sounds, Abd Soft, No tenderness, increased truncal adiposity Extremity/Skin:- No  edema,   good pedal pulses  Psych-affect is appropriate, oriented x3-mentation is back to baseline Neuro-generalized weakness, no new focal deficits, no tremors   Data Review   CBC w Diff:  Lab Results  Component Value Date   WBC 29.1 (H) 07/02/2023   HGB 13.3 07/02/2023   HCT 39.8 07/02/2023   PLT 245 07/02/2023   LYMPHOPCT 3 06/27/2023   MONOPCT 5 06/27/2023   EOSPCT 0 06/27/2023   BASOPCT 0 06/27/2023    CMP:  Lab Results  Component Value Date   NA 140 07/02/2023   K 3.1 (L) 07/02/2023   CL 101 07/02/2023   CO2 29 07/02/2023   BUN 68 (H) 07/02/2023   CREATININE 1.56 (H) 07/02/2023   PROT 6.4 (L) 07/02/2023   ALBUMIN 2.2 (L) 07/02/2023   BILITOT 1.2 07/02/2023   ALKPHOS 104 07/02/2023   AST 19  07/02/2023   ALT 17 07/02/2023   Total Discharge time is about 33 minutes  Shon Hale M.D on 07/02/2023 at 11:24 AM  Go to [email or web address] -  for contact info  [facility] - Office  [phone]

## 2023-07-02 NOTE — Progress Notes (Signed)
Patient wore BiPAP for about 4 hours total time.

## 2023-07-02 NOTE — Plan of Care (Signed)
  Problem: Activity: Goal: Capacity to carry out activities will improve Outcome: Progressing   Problem: Respiratory: Goal: Ability to maintain adequate ventilation will improve Outcome: Progressing   Problem: Coping: Goal: Ability to adjust to condition or change in health will improve Outcome: Progressing   Problem: Nutritional: Goal: Maintenance of adequate nutrition will improve Outcome: Progressing   Problem: Clinical Measurements: Goal: Ability to maintain clinical measurements within normal limits will improve Outcome: Progressing   Problem: Pain Managment: Goal: General experience of comfort will improve Outcome: Progressing   Problem: Safety: Goal: Ability to remain free from injury will improve Outcome: Progressing   Problem: Education: Goal: Ability to demonstrate management of disease process will improve Outcome: Progressing   Problem: Activity: Goal: Capacity to carry out activities will improve Outcome: Progressing

## 2023-07-02 NOTE — Progress Notes (Signed)
Report called and given to [place] at [facility]. Staff member verbalized understanding. Pt. Discharged in stable condition via stretcher bed with [facility]. All discharge paperwork given to [facility] staff. Son, Vilma Prader was called and informed of pt.'s discharge status.

## 2023-07-07 DIAGNOSIS — Z79899 Other long term (current) drug therapy: Secondary | ICD-10-CM | POA: Diagnosis not present

## 2023-07-07 DIAGNOSIS — R399 Unspecified symptoms and signs involving the genitourinary system: Secondary | ICD-10-CM | POA: Diagnosis not present

## 2023-07-07 DIAGNOSIS — R300 Dysuria: Secondary | ICD-10-CM | POA: Diagnosis not present

## 2023-07-08 DIAGNOSIS — N390 Urinary tract infection, site not specified: Secondary | ICD-10-CM | POA: Diagnosis not present

## 2023-07-08 NOTE — Patient Instructions (Signed)

## 2023-07-11 ENCOUNTER — Encounter: Payer: Self-pay | Admitting: Nurse Practitioner

## 2023-07-11 ENCOUNTER — Ambulatory Visit (INDEPENDENT_AMBULATORY_CARE_PROVIDER_SITE_OTHER): Payer: Medicare HMO | Admitting: Nurse Practitioner

## 2023-07-11 VITALS — BP 130/64 | HR 66 | Ht 61.0 in | Wt 280.0 lb

## 2023-07-11 DIAGNOSIS — Z794 Long term (current) use of insulin: Secondary | ICD-10-CM

## 2023-07-11 DIAGNOSIS — E1165 Type 2 diabetes mellitus with hyperglycemia: Secondary | ICD-10-CM | POA: Diagnosis not present

## 2023-07-11 MED ORDER — LANTUS 100 UNIT/ML ~~LOC~~ SOLN: 50.0000 [IU] | Freq: Every day | SUBCUTANEOUS | 3 refills | Status: DC

## 2023-07-11 MED ORDER — NOVOLOG 100 UNIT/ML IJ SOLN: 10.0000 [IU] | Freq: Three times a day (TID) | INTRAMUSCULAR | 3 refills | Status: DC

## 2023-07-11 NOTE — Progress Notes (Signed)
Endocrinology Consult Note       07/11/2023, 4:05 PM   Subjective:    Patient ID: [name], female    DOB: 1961/05/11.  [name] is being seen in consultation for management of currently uncontrolled symptomatic diabetes requested by  [name], NP.   Past Medical History:  Diagnosis Date   Anemia    Arthritis    Asthma    Cancer of sigmoid (HCC)    COPD (chronic obstructive pulmonary disease) (HCC)    Depression    Diabetes mellitus    Diastolic dysfunction 05/15/2015   Dyspnea    Generalized weakness    Hyperlipidemia    Hypertension    Hypothyroidism    Obesity    Palpitations    Pneumonia    PONV (postoperative nausea and vomiting)    Prolonged QT interval 05/14/2015   Sleep apnea     Past Surgical History:  Procedure Laterality Date   BIOPSY  04/06/2022   Procedure: BIOPSY;  Surgeon: Dolores Frame, MD;  [facility];  Service: Gastroenterology;;   CATARACT EXTRACTION     CESAREAN SECTION     CHOLECYSTECTOMY     COLONOSCOPY WITH PROPOFOL N/A 04/06/2022   Procedure: COLONOSCOPY WITH PROPOFOL;  Surgeon: Dolores Frame, MD;  [facility];  Service: Gastroenterology;  Laterality: N/A;  805 ASA 2 patient in [facility]   ESOPHAGOGASTRODUODENOSCOPY (EGD) WITH PROPOFOL N/A 04/06/2022   Procedure: ESOPHAGOGASTRODUODENOSCOPY (EGD) WITH PROPOFOL;  Surgeon: Dolores Frame, MD;  [facility];  Service: Gastroenterology;  Laterality: N/A;   HEMOSTASIS CLIP PLACEMENT  04/06/2022   Procedure: HEMOSTASIS CLIP PLACEMENT;  Surgeon: Dolores Frame, MD;  [facility];  Service: Gastroenterology;;   POLYPECTOMY  04/06/2022   Procedure: POLYPECTOMY;  Surgeon: Marguerita Merles, Reuel Boom, MD;  [facility];  Service: Gastroenterology;;   SUBMUCOSAL TATTOO INJECTION  04/06/2022    Procedure: SUBMUCOSAL TATTOO INJECTION;  Surgeon: Marguerita Merles, Reuel Boom, MD;  [facility];  Service: Gastroenterology;;    Social History   Socioeconomic History   Marital status: Married    Spouse name: Not on file   Number of children: Not on file   Years of education: Not on file   Highest education level: Not on file  Occupational History   Not on file  Tobacco Use   Smoking status: Never    Passive exposure: Yes   Smokeless tobacco: Never  Vaping Use   Vaping status: Never Used  Substance and Sexual Activity   Alcohol use: No    Alcohol/week: 0.0 standard drinks of alcohol   Drug use: No   Sexual activity: Yes    Birth control/protection: None  Other Topics Concern   Not on file  Social History Narrative   Lives with husband.  One living son.  Daughter died after transplant age 65.     Are you right handed or left handed? Right   Are you currently employed ? 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    What type of home do  you live in: 1 story or 2 story? one       Social Determinants of Health   Financial Resource Strain: Medium Risk (08/14/2021)   Received from [facility], [facility]   Overall Financial Resource Strain (CARDIA)    Difficulty of Paying Living Expenses: Somewhat hard  Food Insecurity: Patient Declined (08/12/2022)   Hunger Vital Sign    Worried About Running Out of Food in the Last Year: Patient declined    Ran Out of Food in the Last Year: Patient declined  Transportation Needs: No Transportation Needs (08/12/2022)   PRAPARE - Administrator, Civil Service (Medical): No    Lack of Transportation (Non-Medical): No  Physical Activity: Inactive (08/14/2021)   Received from [facility], [facility]   Exercise Vital Sign    Days of Exercise per Week: 0 days    Minutes of Exercise per Session: 0 min  Stress: Stress Concern Present (08/14/2021)   Received from [facility], [facility] of Occupational Health - Occupational Stress Questionnaire    Feeling of Stress : Very much  Social Connections: Unknown (04/08/2022)   Received from [facility], [facility]   Social Network    Social Network: Not on file    Family History  Problem Relation Age of Onset   Stroke Mother    Diabetes Father    Heart failure Father    Hypertension Father    Diabetes Sister    Heart failure Sister    Hypertension Sister    Stroke Sister    Cancer Other    Stroke Sister     Outpatient Encounter Medications as of 07/11/2023  Medication Sig   acetaminophen (TYLENOL) 650 MG CR tablet Take 650 mg by mouth every 8 (eight) hours as needed for pain.   albuterol (VENTOLIN HFA) 108 (90 Base) MCG/ACT inhaler Inhale 2 puffs into the lungs every 6 (six) hours as needed for wheezing or shortness of breath.   alum & mag hydroxide-simeth (MAALOX/MYLANTA) 200-200-20 MG/5ML suspension Take 30 mLs by mouth every 2 (two) hours as needed for indigestion or heartburn. Do not exceed 4 doses in 24 hours   amLODipine (NORVASC) 5 MG tablet Take 5 mg by mouth daily.   ascorbic acid (VITAMIN C) 500 MG tablet Take 500 mg by mouth daily.   aspirin EC 81 MG tablet Take 81 mg by mouth daily. Swallow whole.   atorvastatin (LIPITOR) 10 MG tablet Take 10 mg by mouth at bedtime.   Biotin 10 MG TABS Take 1 tablet by mouth daily.   cetirizine (ZYRTEC) 10 MG tablet Take 10 mg by mouth daily.   Cholecalciferol (VITAMIN D) 50 MCG (2000 UT) CAPS Take 2,000 Units by mouth daily.   Cranberry 600 MG TABS Take 300 mg by mouth 2 (two) times daily. Half tab bid   docusate sodium (COLACE) 100 MG capsule Take 100 mg by mouth daily.   Emollient (EUCERIN) lotion Apply 1 Application topically every 12 (twelve) hours as needed for dry skin.   ferrous sulfate 325 (65 FE) MG tablet Take 325 mg by mouth daily.   fluticasone furoate-vilanterol (BREO ELLIPTA) 100-25 MCG/ACT AEPB Inhale 1 puff into  the lungs daily.   furosemide (LASIX) 20 MG tablet Take 20 mg by mouth daily.   gabapentin (NEURONTIN) 300 MG capsule Take 1 capsule in AM, 2 capsules in PM (Patient taking differently: Take 300-600 mg by mouth in the morning and at bedtime. Take 1 capsule  in AM, 2 capsules in PM)   Glucerna (GLUCERNA) LIQD Take 237 mLs by mouth daily. For wound healing   hydrALAZINE (APRESOLINE) 25 MG tablet Take 1 tablet (25 mg total) by mouth every 8 (eight) hours.   lansoprazole (PREVACID) 30 MG capsule Take 30 mg by mouth 2 (two) times daily before a meal.   levothyroxine (SYNTHROID) 88 MCG tablet Take 88 mcg by mouth daily before breakfast.   Menthol-Zinc Oxide (CALMOSEPTINE) 0.44-20.6 % OINT Apply 1 Application topically as needed (preservation/protection after incontinent care).   MYRBETRIQ 50 MG TB24 tablet Take 50 mg by mouth daily.   ondansetron (ZOFRAN) 4 MG tablet Take 8 mg by mouth 2 (two) times daily.   potassium chloride SA (K-DUR) 20 MEQ tablet Take 20 mEq by mouth daily.   PROCRIT 4000 UNIT/ML injection Inject 4,000 Units into the skin every Monday, Wednesday, and Friday. Hold if hemoglobin is > 10   propranolol (INDERAL) 80 MG tablet Take 80 mg by mouth daily.   saccharomyces boulardii (FLORASTOR) 250 MG capsule Take 250 mg by mouth 2 (two) times daily.   sertraline (ZOLOFT) 100 MG tablet Take 100 mg by mouth daily.   [DISCONTINUED] ADMELOG 100 UNIT/ML injection Inject 4-8 Units into the skin 3 (three) times daily with meals. If blood glucose is 200-250= 4 units, 250-300= 5 units, 301-350= 6 units, 351-400= 8 units, > 400 notify MD   [DISCONTINUED] LANTUS 100 UNIT/ML injection Inject 0.4 mLs (40 Units total) into the skin at bedtime.   [DISCONTINUED] NOVOLOG 100 UNIT/ML injection Inject 14 Units into the skin 3 (three) times daily with meals. Hold if resident is not eating or if BG is less than 150   [EXPIRED] amoxicillin-clavulanate (AUGMENTIN) 875-125 MG tablet Take 1 tablet by mouth 2 (two)  times daily for 5 days.   LANTUS 100 UNIT/ML injection Inject 0.5 mLs (50 Units total) into the skin at bedtime.   NOVOLOG 100 UNIT/ML injection Inject 10-16 Units into the skin 3 (three) times daily with meals. 90-150= 10 units; 151-200= 11 units; 201-250= 12 units; 251-300= 13 units; 301-350= 14 units; 351-400= 15 units; above 400 = 16 units   [EXPIRED] predniSONE (DELTASONE) 20 MG tablet Take 1 tablet (20 mg total) by mouth daily with breakfast for 5 days.   SANTYL 250 UNIT/GM ointment Apply 1 Application topically daily. (Patient not taking: Reported on 06/27/2023)   No facility-administered encounter medications on file as of 07/11/2023.    ALLERGIES: Allergies  Allergen Reactions   Carvedilol Palpitations   Benicar [Olmesartan] Swelling   Codeine Other (See Comments)    Confusion    Sulfa Antibiotics Swelling    Whole face swells   Trulicity [Dulaglutide] Diarrhea   Ceftriaxone Hives and Rash    VACCINATION STATUS: Immunization History  Administered Date(s) Administered   Influenza,inj,quad, With Preservative 09/24/2015   Pneumococcal Conjugate-13 09/02/2015   Pneumococcal Polysaccharide-23 10/01/2005   Tdap 10/01/2005, 09/02/2015    Diabetes She presents for her initial diabetic visit. She has type 2 diabetes mellitus. Onset time: diagnosed approx 50 years ago per husband report. Her disease course has been worsening. There are no hypoglycemic associated symptoms. Associated symptoms include fatigue, foot paresthesias, polydipsia and polyuria. There are no hypoglycemic complications. Symptoms are stable. Diabetic complications include a CVA (TIA in past), nephropathy, peripheral neuropathy and PVD. Risk factors for coronary artery disease include diabetes mellitus, dyslipidemia, family history, obesity, hypertension and sedentary lifestyle. Current diabetic treatment includes intensive insulin program. She is compliant with treatment most  of the time (lives in [place]). She is  following a generally unhealthy diet. When asked about meal planning, she reported none. She has not had a previous visit with a dietitian. She rarely participates in exercise. Her home blood glucose trend is increasing steadily. Her breakfast blood glucose range is generally >200 mg/dl. Her lunch blood glucose range is generally >200 mg/dl. Her dinner blood glucose range is generally >200 mg/dl. Her bedtime blood glucose range is generally >200 mg/dl. Her overall blood glucose range is >200 mg/dl. (She presents today, accompanied by her husband, for her consultation with readings showing gross hyperglycemia overall.  Her recent A1c, checked during recent hospitalization for PNA was 8.5% on 7/22.  She was also started on oral Prednisone at that time.  According to her MAR, she is on Lantus 40 units SQ nightly and Novolog 14 units TID and Admelog 4-8 units TID.  She drinks mostly diet Mt Dew and unsweet tea (sweetened with equal).  She eats 3 meals per day and snack between.  She does not engage in routine physical exercise, does have therapy from time to time in the nursing home, but she is essentially WC bound.  She reports she has had eye exam at [facility] and also has foot care done routinely there as well.) An ACE inhibitor/angiotensin II receptor blocker is not being taken. She sees a podiatrist.Eye exam is current.     Review of systems  Constitutional: + Minimally fluctuating body weight, current Body mass index is 52.91 kg/m., no fatigue, no subjective hyperthermia, no subjective hypothermia Eyes: no blurry vision, no xerophthalmia ENT: no sore throat, no nodules palpated in throat, no dysphagia/odynophagia, no hoarseness Cardiovascular: no chest pain, no shortness of breath, no palpitations, no leg swelling Respiratory: no cough, no shortness of breath Gastrointestinal: no nausea/vomiting/diarrhea Musculoskeletal: essentially WC bound due to deconditioning Skin: no rashes, no  hyperemia Neurological: no tremors, no numbness, no tingling, no dizziness Psychiatric: no depression, no anxiety  Objective:     BP 130/64 (BP Location: Left Arm, Patient Position: Sitting, Cuff Size: Large)   Pulse 66   Ht 5\' 1"  (1.549 m)   Wt 280 lb (127 kg) Comment: Per the patient  LMP 08/06/2012   BMI 52.91 kg/m   Wt Readings from Last 3 Encounters:  07/11/23 280 lb (127 kg)  07/02/23 274 lb 7.6 oz (124.5 kg)  05/09/23 285 lb 8 oz (129.5 kg)     BP Readings from Last 3 Encounters:  07/11/23 130/64  07/02/23 132/67  05/09/23 (!) 144/64     Physical Exam- Limited  Constitutional:  Body mass index is 52.91 kg/m. , not in acute distress, normal state of mind Eyes:  EOMI, no exophthalmos Neck: Supple Cardiovascular: RRR, no murmurs, rubs, or gallops, no edema Respiratory: Adequate breathing efforts, no crackles, rales, rhonchi, or wheezing Musculoskeletal: essentially WC bound due to deconditioning Skin:  no rashes, no hyperemia Neurological: no tremor with outstretched hands    CMP ( most recent) CMP     Component Value Date/Time   NA 140 07/02/2023 0423   K 3.1 (L) 07/02/2023 0423   CL 101 07/02/2023 0423   CO2 29 07/02/2023 0423   GLUCOSE 201 (H) 07/02/2023 0423   BUN 68 (H) 07/02/2023 0423   CREATININE 1.56 (H) 07/02/2023 0423   CALCIUM 9.3 07/02/2023 0423   PROT 6.4 (L) 07/02/2023 0423   ALBUMIN 2.2 (L) 07/02/2023 0423   AST 19 07/02/2023 0423   ALT 17 07/02/2023 0423  ALKPHOS 104 07/02/2023 0423   BILITOT 1.2 07/02/2023 0423   GFRNONAA 37 (L) 07/02/2023 0423     Diabetic Labs (most recent): Lab Results  Component Value Date   HGBA1C 8.5 (H) 06/27/2023   HGBA1C 7.2 (H) 08/03/2022   HGBA1C 9.9 (H) 11/21/2021     Lipid Panel ( most recent) Lipid Panel  No results found for: "CHOL", "TRIG", "HDL", "CHOLHDL", "VLDL", "LDLCALC", "LDLDIRECT", "LABVLDL"             Assessment & Plan:   1) Type 2 diabetes mellitus with hyperglycemia,  with long-term current use of insulin (HCC)  She presents today, accompanied by her husband, for her consultation with readings showing gross hyperglycemia overall.  Her recent A1c, checked during recent hospitalization for PNA was 8.5% on 7/22.  She was also started on oral Prednisone at that time.  According to her MAR, she is on Lantus 40 units SQ nightly and Novolog 14 units TID and Admelog 4-8 units TID.  She drinks mostly diet Mt Dew and unsweet tea (sweetened with equal).  She eats 3 meals per day and snack between.  She does not engage in routine physical exercise, does have therapy from time to time in the nursing home, but she is essentially WC bound.  She reports she has had eye exam at [facility] and also has foot care done routinely there as well.  Patriciaann Clan Burdi has currently uncontrolled symptomatic type 2 DM since her 62s, with most recent A1c of 8.5 %.   -Recent labs reviewed.  - I had a long discussion with her about the progressive nature of diabetes and the pathology behind its complications. -her diabetes is complicated by TIA, CKD, PVD, neuropathy and she remains at a high risk for more acute and chronic complications which include CAD, CVA, CKD, retinopathy, and neuropathy. These are all discussed in detail with her.  The following Lifestyle Medicine recommendations according to American College of Lifestyle Medicine [facility]) were discussed and offered to patient and she agrees to start the journey:  A. Whole Foods, Plant-based plate comprising of fruits and vegetables, plant-based proteins, whole-grain carbohydrates was discussed in detail with the patient.   A list for source of those nutrients were also provided to the patient.  Patient will use only water or unsweetened tea for hydration. B.  The need to stay away from risky substances including alcohol, smoking; obtaining 7 to 9 hours of restorative sleep, at least 150 minutes of moderate intensity exercise weekly, the  importance of healthy social connections,  and stress reduction techniques were discussed. C.  A full color page of  Calorie density of various food groups per pound showing examples of each food groups was provided to the patient.  - I have counseled her on diet and weight management by adopting a carbohydrate restricted/protein rich diet. Patient is encouraged to switch to unprocessed or minimally processed complex starch and increased protein intake (animal or plant source), fruits, and vegetables. -  she is advised to stick to a routine mealtimes to eat 3 meals a day and avoid unnecessary snacks (to snack only to correct hypoglycemia).   - she acknowledges that there is a room for improvement in her food and drink choices. - Suggestion is made for her to avoid simple carbohydrates from her diet including Cakes, Sweet Desserts, Ice Cream, Soda (diet and regular), Sweet Tea, Candies, Chips, Cookies, Store Bought Juices, Alcohol in Excess of 1-2 drinks a day, Artificial Sweeteners, Coffee  Creamer, and "Sugar-free" Products. This will help patient to have more stable blood glucose profile and potentially avoid unintended weight gain.  - I have approached her with the following individualized plan to manage her diabetes and patient agrees:   -Will adjust her Lantus to 50 units SQ nightly and adjust her Novolog to 10-16 units TID with meals if glucose is above 90 and she is eating (Specific instructions on how to titrate insulin dosage based on glucose readings given to patient in writing).  I did write out instructions that patient is NOT to be on 2 different short-acting insulins, to continue with Novolog and stop the Admelog.  We discussed that she needs to stop drinking diet soda all together in the future to avoid needing more insulin and we discussed strategies to help get to that goal moving forward.  Her glucose is temporarily worsened due to steroid induced hyperglycemia.  I am unsure of how long  she will be on Prednisone, but I did write out request that [facility] send over glucose readings in 2 weeks to review and adjust insulin as needed.  -she is encouraged to continue monitoring glucose 4 times daily, before meals and before bed, and bring them to review at follow up appointment in 3 months.  - she is warned not to take insulin without proper monitoring per orders. - Adjustment parameters are given to her for hypo and hyperglycemia in writing. - she is encouraged to call clinic for blood glucose levels less than 70 or above 300 mg /dl.  - she is not candidate for Trulicity, did not tolerate well in the past, nor Sulfa meds as she is allergic.  She is also not a candidate for Metformin given stage 3 renal failure.  - Specific targets for  A1c; LDL, HDL, and Triglycerides were discussed with the patient.  2) Blood Pressure /Hypertension:  her blood pressure is controlled to target.   she is advised to continue her current medications including Norvasc 5 mg p.o. daily with breakfast, Lasix 20 mg po daily, Hydralazine 25 mg po TID, and Propanolol 80 mg po daily.  3) Lipids/Hyperlipidemia:    There is no recent lipid panel available to review .  she is advised to continue Lipitor 10 mg daily at bedtime.  Side effects and precautions discussed with her.  4)  Weight/Diet:  her Body mass index is 52.91 kg/m.  -  clearly complicating her diabetes care.   she is a candidate for weight loss. I discussed with her the fact that loss of 5 - 10% of her  current body weight will have the most impact on her diabetes management.  Exercise, and detailed carbohydrates information provided  -  detailed on discharge instructions.  5) Chronic Care/Health Maintenance: -she is not on ACEI/ARB and is on Statin medications and is encouraged to initiate and continue to follow up with Ophthalmology, Dentist, Podiatrist at least yearly or according to recommendations, and advised to stay away from smoking.  I have recommended yearly flu vaccine and pneumonia vaccine at least every 5 years; moderate intensity exercise for up to 150 minutes weekly; and sleep for at least 7 hours a day.  - she is advised to maintain close follow up with [name], Ruby Cola, NP for primary care needs, as well as her other providers for optimal and coordinated care.   - Time spent in this patient care: 60 min, of which > 50% was spent in counseling her about her diabetes and  the rest reviewing her blood glucose logs, discussing her hypoglycemia and hyperglycemia episodes, reviewing her current and previous labs/studies (including abstraction from other facilities) and medications doses and developing a long term treatment plan based on the latest standards of care/guidelines; and documenting her care.    Please refer to Patient Instructions for Blood Glucose Monitoring and Insulin/Medications Dosing Guide" in media tab for additional information. Please also refer to "Patient Self Inventory" in the Media tab for reviewed elements of pertinent patient history.  Patriciaann Clan [place] participated in the discussions, expressed understanding, and voiced agreement with the above plans.  All questions were answered to her satisfaction. she is encouraged to contact clinic should she have any questions or concerns prior to her return visit.     Follow up plan: - Return in about 3 months (around 10/11/2023) for Diabetes F/U with A1c in office, No previsit labs, Bring meter and logs.    Ronny Bacon, [facility] [address] [place], [place] [address] Phone: [phone] Fax: [phone]  07/11/2023, 4:05 PM

## 2023-07-12 DIAGNOSIS — Z79899 Other long term (current) drug therapy: Secondary | ICD-10-CM | POA: Diagnosis not present

## 2023-07-12 DIAGNOSIS — N390 Urinary tract infection, site not specified: Secondary | ICD-10-CM | POA: Diagnosis not present

## 2023-07-13 DIAGNOSIS — E1165 Type 2 diabetes mellitus with hyperglycemia: Secondary | ICD-10-CM | POA: Diagnosis not present

## 2023-07-13 DIAGNOSIS — Z79899 Other long term (current) drug therapy: Secondary | ICD-10-CM | POA: Diagnosis not present

## 2023-07-18 DIAGNOSIS — G5601 Carpal tunnel syndrome, right upper limb: Secondary | ICD-10-CM | POA: Diagnosis not present

## 2023-07-18 DIAGNOSIS — G5602 Carpal tunnel syndrome, left upper limb: Secondary | ICD-10-CM | POA: Diagnosis not present

## 2023-07-18 DIAGNOSIS — M25671 Stiffness of right ankle, not elsewhere classified: Secondary | ICD-10-CM | POA: Diagnosis not present

## 2023-07-18 DIAGNOSIS — G5603 Carpal tunnel syndrome, bilateral upper limbs: Secondary | ICD-10-CM | POA: Diagnosis not present

## 2023-07-19 DIAGNOSIS — R110 Nausea: Secondary | ICD-10-CM | POA: Diagnosis not present

## 2023-07-19 DIAGNOSIS — Z79899 Other long term (current) drug therapy: Secondary | ICD-10-CM | POA: Diagnosis not present

## 2023-07-19 DIAGNOSIS — R52 Pain, unspecified: Secondary | ICD-10-CM | POA: Diagnosis not present

## 2023-07-21 DIAGNOSIS — E782 Mixed hyperlipidemia: Secondary | ICD-10-CM | POA: Diagnosis not present

## 2023-07-21 DIAGNOSIS — N189 Chronic kidney disease, unspecified: Secondary | ICD-10-CM | POA: Diagnosis not present

## 2023-07-21 DIAGNOSIS — I10 Essential (primary) hypertension: Secondary | ICD-10-CM | POA: Diagnosis not present

## 2023-07-21 DIAGNOSIS — I5033 Acute on chronic diastolic (congestive) heart failure: Secondary | ICD-10-CM | POA: Diagnosis not present

## 2023-07-21 DIAGNOSIS — Z79899 Other long term (current) drug therapy: Secondary | ICD-10-CM | POA: Diagnosis not present

## 2023-07-27 DIAGNOSIS — J449 Chronic obstructive pulmonary disease, unspecified: Secondary | ICD-10-CM | POA: Diagnosis not present

## 2023-07-27 DIAGNOSIS — E559 Vitamin D deficiency, unspecified: Secondary | ICD-10-CM | POA: Diagnosis not present

## 2023-07-27 DIAGNOSIS — E118 Type 2 diabetes mellitus with unspecified complications: Secondary | ICD-10-CM | POA: Diagnosis not present

## 2023-07-27 DIAGNOSIS — J9601 Acute respiratory failure with hypoxia: Secondary | ICD-10-CM | POA: Diagnosis not present

## 2023-07-27 DIAGNOSIS — I5033 Acute on chronic diastolic (congestive) heart failure: Secondary | ICD-10-CM | POA: Diagnosis not present

## 2023-07-27 DIAGNOSIS — E039 Hypothyroidism, unspecified: Secondary | ICD-10-CM | POA: Diagnosis not present

## 2023-07-27 DIAGNOSIS — J189 Pneumonia, unspecified organism: Secondary | ICD-10-CM | POA: Diagnosis not present

## 2023-07-27 DIAGNOSIS — E785 Hyperlipidemia, unspecified: Secondary | ICD-10-CM | POA: Diagnosis not present

## 2023-07-27 DIAGNOSIS — N390 Urinary tract infection, site not specified: Secondary | ICD-10-CM | POA: Diagnosis not present

## 2023-07-27 DIAGNOSIS — J441 Chronic obstructive pulmonary disease with (acute) exacerbation: Secondary | ICD-10-CM | POA: Diagnosis not present

## 2023-08-01 DIAGNOSIS — R300 Dysuria: Secondary | ICD-10-CM | POA: Diagnosis not present

## 2023-08-01 DIAGNOSIS — N390 Urinary tract infection, site not specified: Secondary | ICD-10-CM | POA: Diagnosis not present

## 2023-08-01 DIAGNOSIS — Z79899 Other long term (current) drug therapy: Secondary | ICD-10-CM | POA: Diagnosis not present

## 2023-08-02 DIAGNOSIS — N3281 Overactive bladder: Secondary | ICD-10-CM | POA: Insufficient documentation

## 2023-08-02 DIAGNOSIS — N390 Urinary tract infection, site not specified: Secondary | ICD-10-CM | POA: Insufficient documentation

## 2023-08-02 NOTE — Progress Notes (Unsigned)
Name: [name] DOB: August 21, 1961 MRN: [id number]  History of Present Illness: [name] is a 62 y.o. female who presents today for follow up visit at [facility]. - GU history: 1. Recurrent UTIs. 2. OAB with urinary ***frequency, ***nocturia, ***urgency, and ***urge incontinence. 3. Stress urinary incontinence.  Urine culture results in past 12 months: - 09/06/2022: Positive for Klebsiella pneumoniae - ***No additional urine culture results in past 12 months found per chart review.  At last visit with Dr. Ronne [name] on 10/06/2022: Myrbetriq increased to 50 mg daily.  Since last visit: - 06/07/2023: RUS showed a exophytic 2.4 cm right renal cyst; no GU stones, masses, or hydronephrosis.   Today: She reports {Blank multiple:19197::"improved","persistent / unchanged"} urinary ***frequency, ***nocturia, ***urgency, and ***urge incontinence. Voiding ***x/day and ***x/night on average. Leaking ***x/day on average; using *** ***pads / ***diapers per day on average.  She {Actions; denies-reports:120008} dysuria, gross hematuria, straining to void, or sensations of incomplete emptying.  She reports *** UTls in the past 12 months. When present, UTI symptoms include ***dysuria, ***increased urinary urgency, ***frequency, ***.  She reports that UTI symptoms do ***not seem to correlate with intercourse.  She {DENIES/REPORTS DEFAULT [id number]:91478} acute UTI symptoms today.   Fall Screening: Do you usually have a device to assist in your mobility? {yes/no:20286} ***cane / ***walker / ***wheelchair   Medications: Current Outpatient Medications  Medication Sig Dispense Refill   acetaminophen (TYLENOL) 650 MG CR tablet Take 650 mg by mouth every 8 (eight) hours as needed for pain.     albuterol (VENTOLIN HFA) 108 (90 Base) MCG/ACT inhaler Inhale 2 puffs into the lungs every 6 (six) hours as needed for wheezing or shortness of breath.     alum & mag  hydroxide-simeth (MAALOX/MYLANTA) 200-200-20 MG/5ML suspension Take 30 mLs by mouth every 2 (two) hours as needed for indigestion or heartburn. Do not exceed 4 doses in 24 hours     amLODipine (NORVASC) 5 MG tablet Take 5 mg by mouth daily.     ascorbic acid (VITAMIN C) 500 MG tablet Take 500 mg by mouth daily.     aspirin EC 81 MG tablet Take 81 mg by mouth daily. Swallow whole.     atorvastatin (LIPITOR) 10 MG tablet Take 10 mg by mouth at bedtime.     Biotin 10 MG TABS Take 1 tablet by mouth daily.     cetirizine (ZYRTEC) 10 MG tablet Take 10 mg by mouth daily.     Cholecalciferol (VITAMIN D) 50 MCG (2000 UT) CAPS Take 2,000 Units by mouth daily.     Cranberry 600 MG TABS Take 300 mg by mouth 2 (two) times daily. Half tab bid     docusate sodium (COLACE) 100 MG capsule Take 100 mg by mouth daily.     Emollient (EUCERIN) lotion Apply 1 Application topically every 12 (twelve) hours as needed for dry skin.     ferrous sulfate 325 (65 FE) MG tablet Take 325 mg by mouth daily.     fluticasone furoate-vilanterol (BREO ELLIPTA) 100-25 MCG/ACT AEPB Inhale 1 puff into the lungs daily. 180 each 5   furosemide (LASIX) 20 MG tablet Take 20 mg by mouth daily.     gabapentin (NEURONTIN) 300 MG capsule Take 1 capsule in AM, 2 capsules in PM (Patient taking differently: Take 300-600 mg by mouth in the morning and at bedtime. Take 1 capsule in AM, 2 capsules in PM) 90 capsule 11   Glucerna (GLUCERNA) LIQD Take 237 mLs  by mouth daily. For wound healing     hydrALAZINE (APRESOLINE) 25 MG tablet Take 1 tablet (25 mg total) by mouth every 8 (eight) hours.     lansoprazole (PREVACID) 30 MG capsule Take 30 mg by mouth 2 (two) times daily before a meal.     LANTUS 100 UNIT/ML injection Inject 0.5 mLs (50 Units total) into the skin at bedtime. 45 mL 3   levothyroxine (SYNTHROID) 88 MCG tablet Take 88 mcg by mouth daily before breakfast.     Menthol-Zinc Oxide (CALMOSEPTINE) 0.44-20.6 % OINT Apply 1 Application  topically as needed (preservation/protection after incontinent care).     MYRBETRIQ 50 MG TB24 tablet Take 50 mg by mouth daily.     NOVOLOG 100 UNIT/ML injection Inject 10-16 Units into the skin 3 (three) times daily with meals. 90-150= 10 units; 151-200= 11 units; 201-250= 12 units; 251-300= 13 units; 301-350= 14 units; 351-400= 15 units; above 400 = 16 units 45 mL 3   ondansetron (ZOFRAN) 4 MG tablet Take 8 mg by mouth 2 (two) times daily.     potassium chloride SA (K-DUR) 20 MEQ tablet Take 20 mEq by mouth daily.     PROCRIT 4000 UNIT/ML injection Inject 4,000 Units into the skin every Monday, Wednesday, and Friday. Hold if hemoglobin is > 10     propranolol (INDERAL) 80 MG tablet Take 80 mg by mouth daily.     saccharomyces boulardii (FLORASTOR) 250 MG capsule Take 250 mg by mouth 2 (two) times daily.     SANTYL 250 UNIT/GM ointment Apply 1 Application topically daily. (Patient not taking: Reported on 06/27/2023)     sertraline (ZOLOFT) 100 MG tablet Take 100 mg by mouth daily.     No current facility-administered medications for this visit.    Allergies: Allergies  Allergen Reactions   Carvedilol Palpitations   Benicar [Olmesartan] Swelling   Codeine Other (See Comments)    Confusion    Sulfa Antibiotics Swelling    Whole face swells   Trulicity [Dulaglutide] Diarrhea   Ceftriaxone Hives and Rash    Past Medical History:  Diagnosis Date   Anemia    Arthritis    Asthma    Cancer of sigmoid (HCC)    COPD (chronic obstructive pulmonary disease) (HCC)    Depression    Diabetes mellitus    Diastolic dysfunction 05/15/2015   Dyspnea    Generalized weakness    Hyperlipidemia    Hypertension    Hypothyroidism    Obesity    Palpitations    Pneumonia    PONV (postoperative nausea and vomiting)    Prolonged QT interval 05/14/2015   Sleep apnea    Past Surgical History:  Procedure Laterality Date   BIOPSY  04/06/2022   Procedure: BIOPSY;  Surgeon: Teresa Frame,  MD;  [facility]: [facility];  Service: Gastroenterology;;   CATARACT EXTRACTION     CESAREAN SECTION     CHOLECYSTECTOMY     COLONOSCOPY WITH PROPOFOL N/A 04/06/2022   Procedure: COLONOSCOPY WITH PROPOFOL;  Surgeon: Teresa Frame, MD;  Location: [facility];  Service: Gastroenterology;  Laterality: N/A;  805 ASA 2 patient in [facility]   ESOPHAGOGASTRODUODENOSCOPY (EGD) WITH PROPOFOL N/A 04/06/2022   Procedure: ESOPHAGOGASTRODUODENOSCOPY (EGD) WITH PROPOFOL;  Surgeon: Teresa Frame, MD;  [facility];  Service: Gastroenterology;  Laterality: N/A;   HEMOSTASIS CLIP PLACEMENT  04/06/2022   Procedure: HEMOSTASIS CLIP PLACEMENT;  Surgeon: Teresa Frame, MD;  [facility]:  [facility];  Service: Gastroenterology;;   POLYPECTOMY  04/06/2022   Procedure: POLYPECTOMY;  Surgeon: Marguerita Merles, Reuel Boom, MD;  [facility]: [facility];  Service: Gastroenterology;;   SUBMUCOSAL TATTOO INJECTION  04/06/2022   Procedure: SUBMUCOSAL TATTOO INJECTION;  Surgeon: Marguerita Merles, Reuel Boom, MD;  Location: [facility];  Service: Gastroenterology;;   Family History  Problem Relation Age of Onset   Stroke Mother    Diabetes Father    Heart failure Father    Hypertension Father    Diabetes Sister    Heart failure Sister    Hypertension Sister    Stroke Sister    Cancer Other    Stroke Sister    Social History   Socioeconomic History   Marital status: Married    Spouse name: Not on file   Number of children: Not on file   Years of education: Not on file   Highest education level: Not on file  Occupational History   Not on file  Tobacco Use   Smoking status: Never    Passive exposure: Yes   Smokeless tobacco: Never  Vaping Use   Vaping status: Never Used  Substance and Sexual Activity   Alcohol use: No    Alcohol/week: 0.0 standard drinks of alcohol   Drug use: No   Sexual activity: Yes    Birth control/protection: None  Other  Topics Concern   Not on file  Social History Narrative   Lives with husband.  One living son.  Daughter died after transplant age 69.     Are you right handed or left handed? Right   Are you currently employed ? 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    What type of home do you live in: 1 story or 2 story? one       Social Determinants of Health   Financial Resource Strain: Medium Risk (08/14/2021)   Received from [facility]   Overall Financial Resource Strain (CARDIA)    Difficulty of Paying Living Expenses: Somewhat hard  Food Insecurity: Patient Declined (08/12/2022)   Hunger Vital Sign    Worried About Running Out of Food in the Last Year: Patient declined    Ran Out of Food in the Last Year: Patient declined  Transportation Needs: No Transportation Needs (08/12/2022)   PRAPARE - Administrator, Civil Service (Medical): No    Lack of Transportation (Non-Medical): No  Physical Activity: Inactive (08/14/2021)   Received from [facility]   Exercise Vital Sign    Days of Exercise per Week: 0 days    Minutes of Exercise per Session: 0 min  Stress: Stress Concern Present (08/14/2021)   Received from [facility] - Occupational Stress Questionnaire    Feeling of Stress : Very much  Social Connections: Unknown (04/08/2022)   Received from [facility]   Social Network    Social Network: Not on file  Intimate Partner Violence: Unknown (08/12/2022)   Humiliation, Afraid, Rape, and Kick questionnaire    Fear of Current or Ex-Partner: No    Emotionally Abused: No    Physically Abused: Not on file    Sexually Abused: Not on file    Review of Systems Constitutional: Patient ***denies any unintentional weight loss or change in strength lntegumentary: Patient ***denies any rashes or pruritus Eyes: Patient denies ***dry  eyes ENT: Patient ***denies dry mouth Cardiovascular: Patient ***denies chest pain or syncope Respiratory: Patient ***denies shortness of breath Gastrointestinal: Patient ***denies nausea, vomiting, constipation, or diarrhea Musculoskeletal: Patient ***denies muscle cramps or weakness Neurologic: Patient ***denies convulsions or seizures Psychiatric: Patient ***denies memory problems Allergic/Immunologic: Patient ***denies recent allergic reaction(s) Hematologic/Lymphatic: Patient denies bleeding tendencies Endocrine: Patient ***denies heat/cold intolerance  GU: As per HPI.  OBJECTIVE There were no vitals filed for this visit. There is no height or weight on file to calculate BMI.  Physical Examination Constitutional: ***No obvious distress; patient is ***non-toxic appearing  Cardiovascular: ***No visible lower extremity edema.  Respiratory: The patient does ***not have audible wheezing/stridor; respirations do ***not appear labored  Gastrointestinal: Abdomen ***non-distended Musculoskeletal: ***Normal ROM of UEs  Skin: ***No obvious rashes/open sores  Neurologic: CN 2-12 grossly ***intact Psychiatric: Answered questions ***appropriately with ***normal affect  Hematologic/Lymphatic/Immunologic: ***No obvious bruises or sites of spontaneous bleeding  UA: ***negative / *** WBC/hpf, *** RBC/hpf, bacteria (***) PVR: *** ml  ASSESSMENT No diagnosis found. *** We discussed the symptoms of overactive bladder (OAB), which include urinary urgency, frequency, nocturia, with or without urge incontinence.   While we may not know the exact etiology of OAB, several risk factors can be identified.  - We discussed this patient's neurogenic risk factors for OAB-type symptoms including ***T2DM, ***nicotine use, ***.  - Likely exacerbated by ***diuretic use, ***caffeine intake, ***consumption of bladder irritants such as (acidic foods, spicy foods, alcohol).   We discussed the following  management options in detail including potential benefits, risks, and side effects: Behavioral therapy: Modify fluid intake Decreasing bladder irritants (such as caffeine, acidic foods, spicy foods, alcohol) Urge suppression strategies Bladder retraining / timed voiding Double voiding Medication(s): - For anticholinergic medications, we discussed the potential side effects of anticholinergics including dry eyes, dry mouth, constipation, cognitive impairment and urinary retention.  - For beta-3 agonist medication, we discussed the risk for urinary retention and the potential side effect of elevated blood pressure specific to Myrbetriq (which is more likely to occur in individuals with uncontrolled hypertension).  For refractory cases: PTNS (posterior tibial nerve stimulation) Sacral neuromodulation trial (Medtronic lnterStim or Axonics implant) Bladder Botox injections In extreme cases, SP tube placement  ***In order to further evaluate urinary incontinence, She was instructed to complete a 3-day bladder diary.  ***Discussed recommendation for urodynamic testing, which was described in detail including risks such as bleeding, infection, organ/tissue/nerve damage.  She decided to proceed with *** ***work on behavioral modifications including ***minimizing caffeine intake and working on ***timed voiding.  Will plan for follow up in ***8 weeks / *** months / ***1 year or sooner if needed. Pt verbalized understanding and agreement. All questions were answered.  PLAN Advised the following: *** ***. Minimize caffeine intake. ***. Work on timed voiding. ***. Urge suppression strategies. ***. Double/ triple voiding. ***. No follow-ups on file.  No orders of the defined types were placed in this encounter.   It has been explained that the patient is to follow regularly with their PCP in addition to all other providers involved in their care and to follow instructions provided by these  respective offices. Patient advised to contact urology clinic if any urologic-pertaining questions, concerns, new symptoms or problems arise in the interim period.  There are no Patient Instructions on file for this visit.  Electronically signed by:  Donnita Falls, FNP   08/02/23    9:12 AM

## 2023-08-04 ENCOUNTER — Ambulatory Visit (INDEPENDENT_AMBULATORY_CARE_PROVIDER_SITE_OTHER): Payer: Medicare HMO | Admitting: Urology

## 2023-08-04 ENCOUNTER — Encounter: Payer: Self-pay | Admitting: Urology

## 2023-08-04 VITALS — BP 107/67 | HR 71 | Temp 98.4°F

## 2023-08-04 DIAGNOSIS — N3946 Mixed incontinence: Secondary | ICD-10-CM | POA: Diagnosis not present

## 2023-08-04 DIAGNOSIS — N390 Urinary tract infection, site not specified: Secondary | ICD-10-CM

## 2023-08-04 DIAGNOSIS — R262 Difficulty in walking, not elsewhere classified: Secondary | ICD-10-CM

## 2023-08-04 DIAGNOSIS — N3281 Overactive bladder: Secondary | ICD-10-CM | POA: Diagnosis not present

## 2023-08-04 DIAGNOSIS — R3915 Urgency of urination: Secondary | ICD-10-CM | POA: Insufficient documentation

## 2023-08-04 DIAGNOSIS — N393 Stress incontinence (female) (male): Secondary | ICD-10-CM

## 2023-08-04 DIAGNOSIS — R399 Unspecified symptoms and signs involving the genitourinary system: Secondary | ICD-10-CM

## 2023-08-04 DIAGNOSIS — R350 Frequency of micturition: Secondary | ICD-10-CM

## 2023-08-04 DIAGNOSIS — R41841 Cognitive communication deficit: Secondary | ICD-10-CM | POA: Insufficient documentation

## 2023-08-04 DIAGNOSIS — N281 Cyst of kidney, acquired: Secondary | ICD-10-CM | POA: Diagnosis not present

## 2023-08-04 DIAGNOSIS — R8290 Unspecified abnormal findings in urine: Secondary | ICD-10-CM | POA: Diagnosis not present

## 2023-08-04 DIAGNOSIS — N3941 Urge incontinence: Secondary | ICD-10-CM | POA: Insufficient documentation

## 2023-08-04 DIAGNOSIS — R82998 Other abnormal findings in urine: Secondary | ICD-10-CM | POA: Diagnosis not present

## 2023-08-04 LAB — URINALYSIS, ROUTINE W REFLEX MICROSCOPIC
Bilirubin, UA: NEGATIVE
Glucose, UA: NEGATIVE
Ketones, UA: NEGATIVE
Nitrite, UA: POSITIVE — AB
RBC, UA: NEGATIVE
Specific Gravity, UA: 1.01 (ref 1.005–1.030)
Urobilinogen, Ur: 0.2 mg/dL (ref 0.2–1.0)
pH, UA: 6 (ref 5.0–7.5)

## 2023-08-04 LAB — MICROSCOPIC EXAMINATION
Epithelial Cells (non renal): >10 /hpf — AB (ref 0–10)
WBC, UA: >30 /hpf — AB (ref 0–5)

## 2023-08-04 LAB — BLADDER SCAN AMB NON-IMAGING: Scan Result: 0

## 2023-08-04 MED ORDER — AMOXICILLIN-POT CLAVULANATE 500-125 MG PO TABS
1.0000 | ORAL_TABLET | Freq: Two times a day (BID) | ORAL | 0 refills | Status: DC
Start: 2023-08-04 — End: 2023-08-22

## 2023-08-04 MED ORDER — MYRBETRIQ 50 MG PO TB24: 50.0000 mg | ORAL_TABLET | Freq: Every day | ORAL | 11 refills | Status: AC

## 2023-08-08 LAB — URINALYSIS, ROUTINE W REFLEX MICROSCOPIC

## 2023-08-11 DIAGNOSIS — R0989 Other specified symptoms and signs involving the circulatory and respiratory systems: Secondary | ICD-10-CM | POA: Diagnosis not present

## 2023-08-11 DIAGNOSIS — N390 Urinary tract infection, site not specified: Secondary | ICD-10-CM | POA: Diagnosis not present

## 2023-08-11 DIAGNOSIS — R4182 Altered mental status, unspecified: Secondary | ICD-10-CM | POA: Diagnosis not present

## 2023-08-11 DIAGNOSIS — L089 Local infection of the skin and subcutaneous tissue, unspecified: Secondary | ICD-10-CM | POA: Diagnosis not present

## 2023-08-11 DIAGNOSIS — R059 Cough, unspecified: Secondary | ICD-10-CM | POA: Diagnosis not present

## 2023-08-12 DIAGNOSIS — N390 Urinary tract infection, site not specified: Secondary | ICD-10-CM | POA: Diagnosis not present

## 2023-08-12 DIAGNOSIS — S91302A Unspecified open wound, left foot, initial encounter: Secondary | ICD-10-CM | POA: Diagnosis not present

## 2023-08-12 DIAGNOSIS — Z79899 Other long term (current) drug therapy: Secondary | ICD-10-CM | POA: Diagnosis not present

## 2023-08-12 DIAGNOSIS — I5032 Chronic diastolic (congestive) heart failure: Secondary | ICD-10-CM | POA: Diagnosis not present

## 2023-08-12 DIAGNOSIS — D72829 Elevated white blood cell count, unspecified: Secondary | ICD-10-CM | POA: Diagnosis not present

## 2023-08-15 ENCOUNTER — Inpatient Hospital Stay: Payer: Medicare HMO | Attending: Hematology

## 2023-08-15 DIAGNOSIS — Z85048 Personal history of other malignant neoplasm of rectum, rectosigmoid junction, and anus: Secondary | ICD-10-CM | POA: Insufficient documentation

## 2023-08-15 DIAGNOSIS — C187 Malignant neoplasm of sigmoid colon: Secondary | ICD-10-CM

## 2023-08-15 DIAGNOSIS — L089 Local infection of the skin and subcutaneous tissue, unspecified: Secondary | ICD-10-CM | POA: Diagnosis not present

## 2023-08-15 DIAGNOSIS — D509 Iron deficiency anemia, unspecified: Secondary | ICD-10-CM | POA: Insufficient documentation

## 2023-08-15 DIAGNOSIS — N189 Chronic kidney disease, unspecified: Secondary | ICD-10-CM | POA: Diagnosis not present

## 2023-08-15 DIAGNOSIS — D631 Anemia in chronic kidney disease: Secondary | ICD-10-CM | POA: Diagnosis not present

## 2023-08-15 DIAGNOSIS — Z79899 Other long term (current) drug therapy: Secondary | ICD-10-CM | POA: Diagnosis not present

## 2023-08-15 DIAGNOSIS — T148XXA Other injury of unspecified body region, initial encounter: Secondary | ICD-10-CM | POA: Diagnosis not present

## 2023-08-15 DIAGNOSIS — N390 Urinary tract infection, site not specified: Secondary | ICD-10-CM | POA: Diagnosis not present

## 2023-08-15 LAB — CBC WITH DIFFERENTIAL/PLATELET
Abs Immature Granulocytes: 0.07 10*3/uL (ref 0.00–0.07)
Basophils Absolute: 0.1 10*3/uL (ref 0.0–0.1)
Basophils Relative: 1 %
Eosinophils Absolute: 0.7 10*3/uL — ABNORMAL HIGH (ref 0.0–0.5)
Eosinophils Relative: 7 %
HCT: 38.7 % (ref 36.0–46.0)
Hemoglobin: 12.5 g/dL (ref 12.0–15.0)
Immature Granulocytes: 1 %
Lymphocytes Relative: 13 %
Lymphs Abs: 1.4 10*3/uL (ref 0.7–4.0)
MCH: 28.3 pg (ref 26.0–34.0)
MCHC: 32.3 g/dL (ref 30.0–36.0)
MCV: 87.8 fL (ref 80.0–100.0)
Monocytes Absolute: 0.6 10*3/uL (ref 0.1–1.0)
Monocytes Relative: 6 %
Neutro Abs: 7.7 10*3/uL (ref 1.7–7.7)
Neutrophils Relative %: 72 %
Platelets: 367 10*3/uL (ref 150–400)
RBC: 4.41 MIL/uL (ref 3.87–5.11)
RDW: 14.3 % (ref 11.5–15.5)
WBC: 10.5 10*3/uL (ref 4.0–10.5)
nRBC: 0 % (ref 0.0–0.2)

## 2023-08-15 LAB — COMPREHENSIVE METABOLIC PANEL
ALT: 12 U/L (ref 0–44)
AST: 17 U/L (ref 15–41)
Albumin: 2.6 g/dL — ABNORMAL LOW (ref 3.5–5.0)
Alkaline Phosphatase: 105 U/L (ref 38–126)
Anion gap: 8 (ref 5–15)
BUN: 30 mg/dL — ABNORMAL HIGH (ref 8–23)
CO2: 33 mmol/L — ABNORMAL HIGH (ref 22–32)
Calcium: 8.8 mg/dL — ABNORMAL LOW (ref 8.9–10.3)
Chloride: 96 mmol/L — ABNORMAL LOW (ref 98–111)
Creatinine, Ser: 1.79 mg/dL — ABNORMAL HIGH (ref 0.44–1.00)
GFR, Estimated: 32 mL/min — ABNORMAL LOW (ref >60)
Glucose, Bld: 175 mg/dL — ABNORMAL HIGH (ref 70–99)
Potassium: 3.7 mmol/L (ref 3.5–5.1)
Sodium: 137 mmol/L (ref 135–145)
Total Bilirubin: 0.5 mg/dL (ref 0.3–1.2)
Total Protein: 7.3 g/dL (ref 6.5–8.1)

## 2023-08-15 MED ORDER — SODIUM CHLORIDE 0.9% FLUSH: 10.0000 mL | Freq: Once | INTRAVENOUS | Status: DC

## 2023-08-15 MED ORDER — HEPARIN SOD (PORK) LOCK FLUSH 100 UNIT/ML IV SOLN: 500.0000 [IU] | Freq: Once | INTRAVENOUS | Status: DC

## 2023-08-15 NOTE — Progress Notes (Signed)
Patient does not have a port.  Labs drawn peripherally.

## 2023-08-16 DIAGNOSIS — E1140 Type 2 diabetes mellitus with diabetic neuropathy, unspecified: Secondary | ICD-10-CM | POA: Diagnosis not present

## 2023-08-16 LAB — CEA: CEA: 1.8 ng/mL (ref 0.0–4.7)

## 2023-08-18 DIAGNOSIS — L97411 Non-pressure chronic ulcer of right heel and midfoot limited to breakdown of skin: Secondary | ICD-10-CM | POA: Diagnosis not present

## 2023-08-18 DIAGNOSIS — E1165 Type 2 diabetes mellitus with hyperglycemia: Secondary | ICD-10-CM | POA: Diagnosis not present

## 2023-08-18 DIAGNOSIS — B3731 Acute candidiasis of vulva and vagina: Secondary | ICD-10-CM | POA: Diagnosis not present

## 2023-08-18 DIAGNOSIS — Z79899 Other long term (current) drug therapy: Secondary | ICD-10-CM | POA: Diagnosis not present

## 2023-08-18 DIAGNOSIS — Z87440 Personal history of urinary (tract) infections: Secondary | ICD-10-CM | POA: Diagnosis not present

## 2023-08-22 ENCOUNTER — Inpatient Hospital Stay (HOSPITAL_BASED_OUTPATIENT_CLINIC_OR_DEPARTMENT_OTHER): Payer: Medicare HMO | Admitting: Hematology

## 2023-08-22 ENCOUNTER — Encounter: Payer: Self-pay | Admitting: Hematology

## 2023-08-22 VITALS — BP 131/53 | HR 63 | Temp 97.8°F | Resp 18 | Wt 280.0 lb

## 2023-08-22 DIAGNOSIS — C187 Malignant neoplasm of sigmoid colon: Secondary | ICD-10-CM

## 2023-08-22 DIAGNOSIS — Z85048 Personal history of other malignant neoplasm of rectum, rectosigmoid junction, and anus: Secondary | ICD-10-CM | POA: Diagnosis not present

## 2023-08-22 DIAGNOSIS — D509 Iron deficiency anemia, unspecified: Secondary | ICD-10-CM | POA: Diagnosis not present

## 2023-08-22 DIAGNOSIS — N189 Chronic kidney disease, unspecified: Secondary | ICD-10-CM | POA: Diagnosis not present

## 2023-08-22 DIAGNOSIS — D631 Anemia in chronic kidney disease: Secondary | ICD-10-CM | POA: Diagnosis not present

## 2023-08-22 NOTE — Patient Instructions (Addendum)
[facility]  Discharge Instructions  You were seen and examined today by Dr. Ellin Saba.  Dr. Ellin Saba discussed your most recent lab work which revealed that everything looks good.  Be sure that you are drinking plenty of water.  Follow-up as scheduled in 3 months.    Thank you for choosing [facility] to provide your oncology and hematology care.   To afford each patient quality time with our provider, please arrive at least 15 minutes before your scheduled appointment time. You may need to reschedule your appointment if you arrive late (10 or more minutes). Arriving late affects you and other patients whose appointments are after yours.  Also, if you miss three or more appointments without notifying the office, you may be dismissed from the clinic at the provider's discretion.    Again, thank you for choosing [facility].  Our hope is that these requests will decrease the amount of time that you wait before being seen by our physicians.   If you have a lab appointment with the Cancer Center - please note that after April 8th, all labs will be drawn in the cancer center.  You do not have to check in or register with the main entrance as you have in the past but will complete your check-in at the cancer center.            _____________________________________________________________  Should you have questions after your visit to [facility], please contact our office at [phone] and follow the prompts.  Our office hours are 8:00 a.m. to 4:30 p.m. Monday - Thursday and 8:00 a.m. to 2:30 p.m. Friday.  Please note that voicemails left after 4:00 p.m. may not be returned until the following business day.  We are closed weekends and all major holidays.  You do have access to a nurse 24-7, just call the main number to the clinic [phone] and do not press any options, hold on the line and a nurse will answer  the phone.    For prescription refill requests, have your pharmacy contact our office and allow 72 hours.    Masks are no longer required in the cancer centers. If you would like for your care team to wear a mask while they are taking care of you, please let them know. You may have one support person who is at least 62 years old accompany you for your appointments.

## 2023-08-22 NOTE — Progress Notes (Signed)
[facility] [address], [place] [address]    Clinic Day:  08/22/2023  Referring physician: [name], NP  Patient Care Team: [name], NP as PCP - General (Adult Health Nurse Practitioner) West Bali, MD (Inactive) as Consulting Physician (Gastroenterology) Doreatha Massed, MD as Medical Oncologist (Medical Oncology) Therese Sarah, RN as Oncology Nurse Navigator (Medical Oncology) Van Clines, MD as Consulting Physician (Neurology)   ASSESSMENT & PLAN:   Assessment: 1.  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  Rectal mass biopsy consistent with moderately differentiated adenocarcinoma. - CEA (04/06/2022): 25.7. - CT CAP (04/27/2022): No evidence of mass, lymphadenopathy or metastatic disease in the chest, abdomen or pelvis.  Clustered subsolid nodular opacities in the superior segment of the left lower lobe measures 0.6 cm. - Rectal MRI (05/10/2022): Lesion is in the sigmoid colon measuring 4 cm with findings for small volume extension into sigmoid mesocolon.  Intermittent association with adjacent uterus without gross invasion.  Sigmoid mesocolon node, borderline size.  Right external iliac nodes are favored to be reactive. - Low anterior resection (08/11/2022) by Dr. Maisie Fus - Pathology: Moderately differentiated adenocarcinoma, margins negative.  Site of tumor is rectosigmoid junction.  0/14 lymph nodes involved.  Negative LVI and PNI.  PT3 pN0.  MMR preserved.   2.  Social/family history: - She is a resident at [facility] nursing home for the past few months due to falls from right foot drop and numbness in the right hand, right foot and lower leg.  Previously she worked at the nursing home.  She is a non-smoker. - Maternal grandmother had colon cancer.    Plan: 1.  Stage II (T3 N0 M0)  rectosigmoid junction adenocarcinoma, MMR preserved: - Previous CT AP on 05/03/2023 showed mildly enlarged right common iliac lymph node 9 mm. - She denies any change in bowel habits.  No bleeding per rectum or melena. - Labs from 08/15/2023 reviewed by me showed normal LFTs.  CBC was grossly normal.  Creatinine elevated at 1.79.  CEA was normal at 1.8. - Recommend follow-up in 3 months with repeat CTAP with oral contrast only.  Will also repeat her CEA and LFTs.   2.  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 - Continue iron tablet daily.  Hemoglobin is 12.5.  Continue Colace.    No orders of the defined types were placed in this encounter.     Doreatha Massed, MD   9/16/20242:56 PM  CHIEF COMPLAINT:   Diagnosis: stage II rectosigmoid cancer    Cancer Staging  Cancer of sigmoid colon [facility]) Staging form: Colon and Rectum, AJCC 8th Edition - Clinical stage from 05/12/2022: Stage IIA (cT3, cN0, cM0) - Signed by Doreatha Massed, MD on 09/08/2022    Prior Therapy: Low anterior resection on 08/11/2022   Current Therapy:  Surveillance    HISTORY OF PRESENT ILLNESS:   Oncology History  Cancer of sigmoid colon (HCC)  05/12/2022 Initial Diagnosis   Cancer of sigmoid colon (HCC)   05/12/2022 Cancer Staging   Staging form: Colon and Rectum, AJCC 8th Edition - Clinical stage from 05/12/2022: Stage IIA (cT3, cN0, cM0) - Signed by Doreatha Massed, MD on 09/08/2022 Histopathologic type: Adenocarcinoma, NOS Total positive nodes: 0 Histologic grade (G): G2 Histologic grading system: 4 grade system      INTERVAL HISTORY:   [name] is a  62 y.o. female seen for follow-up of rectosigmoid colon cancer.  Chronic constipation is stable.  Denied any change in bowel habits.  Denied any bleeding per rectum or melena.  PAST MEDICAL HISTORY:   Past Medical History: Past Medical History:  Diagnosis Date   Anemia    Arthritis    Asthma    Cancer of  sigmoid (HCC)    COPD (chronic obstructive pulmonary disease) (HCC)    Depression    Diabetes mellitus    Diastolic dysfunction 05/15/2015   Dyspnea    Generalized weakness    Hyperlipidemia    Hypertension    Hypothyroidism    Obesity    Palpitations    Pneumonia    PONV (postoperative nausea and vomiting)    Prolonged QT interval 05/14/2015   Sleep apnea     Surgical History: Past Surgical History:  Procedure Laterality Date   BIOPSY  04/06/2022   Procedure: BIOPSY;  Surgeon: Dolores Frame, MD;  [facility];  Service: Gastroenterology;;   CATARACT EXTRACTION     CESAREAN SECTION     CHOLECYSTECTOMY     COLONOSCOPY WITH PROPOFOL N/A 04/06/2022   Procedure: COLONOSCOPY WITH PROPOFOL;  Surgeon: Dolores Frame, MD;  [facility];  Service: Gastroenterology;  Laterality: N/A;  805 ASA 2 patient in [facility]   ESOPHAGOGASTRODUODENOSCOPY (EGD) WITH PROPOFOL N/A 04/06/2022   Procedure: ESOPHAGOGASTRODUODENOSCOPY (EGD) WITH PROPOFOL;  Surgeon: Dolores Frame, MD;  [facility];  Service: Gastroenterology;  Laterality: N/A;   HEMOSTASIS CLIP PLACEMENT  04/06/2022   Procedure: HEMOSTASIS CLIP PLACEMENT;  Surgeon: Dolores Frame, MD;  [facility];  Service: Gastroenterology;;   POLYPECTOMY  04/06/2022   Procedure: POLYPECTOMY;  Surgeon: Marguerita Merles, Reuel Boom, MD;  [facility];  Service: Gastroenterology;;   SUBMUCOSAL TATTOO INJECTION  04/06/2022   Procedure: SUBMUCOSAL TATTOO INJECTION;  Surgeon: Marguerita Merles, Reuel Boom, MD;  [facility];  Service: Gastroenterology;;    Social History: Social History   Socioeconomic History   Marital status: Married    Spouse name: Not on file   Number of children: Not on file   Years of education: Not on file   Highest education level: Not on file  Occupational History   Not on file  Tobacco Use   Smoking status:  Never    Passive exposure: Yes   Smokeless tobacco: Never  Vaping Use   Vaping status: Never Used  Substance and Sexual Activity   Alcohol use: No    Alcohol/week: 0.0 standard drinks of alcohol   Drug use: No   Sexual activity: Yes    Birth control/protection: None  Other Topics Concern   Not on file  Social History Narrative   Lives with husband.  One living son.  Daughter died after transplant age 70.     Are you right handed or left handed? Right   Are you currently employed ? 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    What type of home do you live in: 1 story or 2 story? one       Social Determinants of Health   Financial Resource Strain: Medium Risk (08/14/2021)   Received from [facility], [facility]   Overall Financial Resource Strain (CARDIA)    Difficulty of Paying Living Expenses: Somewhat hard  Food Insecurity: Patient Declined (08/12/2022)   Hunger Vital Sign  Worried About Programme researcher, broadcasting/film/video in the Last Year: Patient declined    Barista in the Last Year: Patient declined  Transportation Needs: No Transportation Needs (08/12/2022)   PRAPARE - Administrator, Civil Service (Medical): No    Lack of Transportation (Non-Medical): No  Physical Activity: Inactive (08/14/2021)   Received from [facility], [facility]   Exercise Vital Sign    Days of Exercise per Week: 0 days    Minutes of Exercise per Session: 0 min  Stress: Stress Concern Present (08/14/2021)   Received from [facility], [facility] - Occupational Stress Questionnaire    Feeling of Stress : Very much  Social Connections: Unknown (04/08/2022)   Received from [facility], [facility]   Social Network    Social Network: Not on file  Intimate Partner Violence: Unknown (08/12/2022)   Humiliation, Afraid, Rape, and Kick questionnaire    Fear of Current or Ex-Partner: No     Emotionally Abused: No    Physically Abused: Not on file    Sexually Abused: Not on file    Family History: Family History  Problem Relation Age of Onset   Stroke Mother    Diabetes Father    Heart failure Father    Hypertension Father    Diabetes Sister    Heart failure Sister    Hypertension Sister    Stroke Sister    Cancer Other    Stroke Sister     Current Medications:  Current Outpatient Medications:    acetaminophen (TYLENOL) 650 MG CR tablet, Take 650 mg by mouth every 8 (eight) hours as needed for pain., Disp: , Rfl:    albuterol (VENTOLIN HFA) 108 (90 Base) MCG/ACT inhaler, Inhale 2 puffs into the lungs every 6 (six) hours as needed for wheezing or shortness of breath., Disp: , Rfl:    alum & mag hydroxide-simeth (MAALOX/MYLANTA) 200-200-20 MG/5ML suspension, Take 30 mLs by mouth every 2 (two) hours as needed for indigestion or heartburn. Do not exceed 4 doses in 24 hours, Disp: , Rfl:    amLODipine (NORVASC) 5 MG tablet, Take 5 mg by mouth daily., Disp: , Rfl:    amoxicillin-clavulanate (AUGMENTIN) 500-125 MG tablet, Take 1 tablet by mouth in the morning and at bedtime., Disp: 7 tablet, Rfl: 0   ascorbic acid (VITAMIN C) 500 MG tablet, Take 500 mg by mouth daily., Disp: , Rfl:    aspirin EC 81 MG tablet, Take 81 mg by mouth daily. Swallow whole., Disp: , Rfl:    atorvastatin (LIPITOR) 10 MG tablet, Take 10 mg by mouth at bedtime., Disp: , Rfl:    Biotin 10 MG TABS, Take 1 tablet by mouth daily., Disp: , Rfl:    cetirizine (ZYRTEC) 10 MG tablet, Take 10 mg by mouth daily., Disp: , Rfl:    Cholecalciferol (VITAMIN D) 50 MCG (2000 UT) CAPS, Take 2,000 Units by mouth daily., Disp: , Rfl:    Cranberry 600 MG TABS, Take 300 mg by mouth 2 (two) times daily. Half tab bid, Disp: , Rfl:    docusate sodium (COLACE) 100 MG capsule, Take 100 mg by mouth daily., Disp: , Rfl:    Emollient (EUCERIN) lotion, Apply 1 Application topically every 12 (twelve) hours as needed for dry skin.,  Disp: , Rfl:    ferrous sulfate 325 (65 FE) MG tablet, Take 325 mg by mouth daily., Disp: , Rfl:    fluticasone furoate-vilanterol (  BREO ELLIPTA) 100-25 MCG/ACT AEPB, Inhale 1 puff into the lungs daily., Disp: 180 each, Rfl: 5   furosemide (LASIX) 20 MG tablet, Take 20 mg by mouth daily., Disp: , Rfl:    gabapentin (NEURONTIN) 300 MG capsule, Take 1 capsule in AM, 2 capsules in PM (Patient taking differently: Take 300-600 mg by mouth in the morning and at bedtime. Take 1 capsule in AM, 2 capsules in PM), Disp: 90 capsule, Rfl: 11   Glucerna (GLUCERNA) LIQD, Take 237 mLs by mouth daily. For wound healing, Disp: , Rfl:    hydrALAZINE (APRESOLINE) 25 MG tablet, Take 1 tablet (25 mg total) by mouth every 8 (eight) hours., Disp: , Rfl:    lansoprazole (PREVACID) 30 MG capsule, Take 30 mg by mouth 2 (two) times daily before a meal., Disp: , Rfl:    LANTUS 100 UNIT/ML injection, Inject 0.5 mLs (50 Units total) into the skin at bedtime., Disp: 45 mL, Rfl: 3   levothyroxine (SYNTHROID) 88 MCG tablet, Take 88 mcg by mouth daily before breakfast., Disp: , Rfl:    Menthol-Zinc Oxide (CALMOSEPTINE) 0.44-20.6 % OINT, Apply 1 Application topically as needed (preservation/protection after incontinent care)., Disp: , Rfl:    MYRBETRIQ 50 MG TB24 tablet, Take 1 tablet (50 mg total) by mouth daily., Disp: 30 tablet, Rfl: 11   NOVOLOG 100 UNIT/ML injection, Inject 10-16 Units into the skin 3 (three) times daily with meals. 90-150= 10 units; 151-200= 11 units; 201-250= 12 units; 251-300= 13 units; 301-350= 14 units; 351-400= 15 units; above 400 = 16 units, Disp: 45 mL, Rfl: 3   ondansetron (ZOFRAN) 4 MG tablet, Take 8 mg by mouth 2 (two) times daily., Disp: , Rfl:    potassium chloride SA (K-DUR) 20 MEQ tablet, Take 20 mEq by mouth daily., Disp: , Rfl:    PROCRIT 4000 UNIT/ML injection, Inject 4,000 Units into the skin every Monday, Wednesday, and Friday. Hold if hemoglobin is > 10, Disp: , Rfl:    propranolol (INDERAL)  80 MG tablet, Take 80 mg by mouth daily., Disp: , Rfl:    saccharomyces boulardii (FLORASTOR) 250 MG capsule, Take 250 mg by mouth 2 (two) times daily., Disp: , Rfl:    SANTYL 250 UNIT/GM ointment, Apply 1 Application topically daily., Disp: , Rfl:    sertraline (ZOLOFT) 100 MG tablet, Take 100 mg by mouth daily., Disp: , Rfl:    Allergies: Allergies  Allergen Reactions   Carvedilol Palpitations   Benicar [Olmesartan] Swelling   Codeine Other (See Comments)    Confusion    Sulfa Antibiotics Swelling    Whole face swells   Trulicity [Dulaglutide] Diarrhea   Ceftriaxone Hives and Rash    REVIEW OF SYSTEMS:   Review of Systems  Constitutional:  Negative for chills, fatigue and fever.  HENT:   Negative for lump/mass, mouth sores, nosebleeds, sore throat and trouble swallowing.   Eyes:  Negative for eye problems.  Respiratory:  Positive for shortness of breath.   Cardiovascular:  Negative for chest pain, leg swelling and palpitations.  Gastrointestinal:  Positive for constipation. Negative for abdominal pain, diarrhea, nausea and vomiting.  Genitourinary:  Negative for bladder incontinence, difficulty urinating, dysuria, frequency, hematuria and nocturia.   Musculoskeletal:  Negative for arthralgias, back pain, flank pain, myalgias and neck pain.  Skin:  Negative for itching and rash.  Neurological:  Positive for numbness. Negative for dizziness and headaches.  Hematological:  Does not bruise/bleed easily.  Psychiatric/Behavioral:  Negative for depression, sleep disturbance and suicidal  ideas. The patient is not nervous/anxious.   All other systems reviewed and are negative.    VITALS:   Last menstrual period 08/06/2012.  Wt Readings from Last 3 Encounters:  07/11/23 280 lb (127 kg)  07/02/23 274 lb 7.6 oz (124.5 kg)  05/09/23 285 lb 8 oz (129.5 kg)    There is no height or weight on file to calculate BMI.  Performance status (ECOG): 1 - Symptomatic but completely  ambulatory  PHYSICAL EXAM:   Physical Exam Vitals and nursing note reviewed. Exam conducted with a chaperone present.  Constitutional:      Appearance: Normal appearance.  Cardiovascular:     Rate and Rhythm: Normal rate and regular rhythm.     Pulses: Normal pulses.     Heart sounds: Normal heart sounds.  Pulmonary:     Effort: Pulmonary effort is normal.     Breath sounds: Normal breath sounds.  Abdominal:     Palpations: Abdomen is soft. There is no hepatomegaly, splenomegaly or mass.     Tenderness: There is no abdominal tenderness.  Musculoskeletal:     Right lower leg: No edema.     Left lower leg: No edema.  Lymphadenopathy:     Cervical: No cervical adenopathy.     Right cervical: No superficial, deep or posterior cervical adenopathy.    Left cervical: No superficial, deep or posterior cervical adenopathy.     Upper Body:     Right upper body: No supraclavicular or axillary adenopathy.     Left upper body: No supraclavicular or axillary adenopathy.  Neurological:     General: No focal deficit present.     Mental Status: She is alert and oriented to person, place, and time.  Psychiatric:        Mood and Affect: Mood normal.        Behavior: Behavior normal.     LABS:      Latest Ref Rng & Units 08/15/2023   11:31 AM 07/02/2023    4:23 AM 06/30/2023    5:24 AM  CBC  WBC 4.0 - 10.5 K/uL 10.5  29.1  27.6   Hemoglobin 12.0 - 15.0 g/dL 28.4  13.2  44.0   Hematocrit 36.0 - 46.0 % 38.7  39.8  36.3   Platelets 150 - 400 K/uL 367  245  267       Latest Ref Rng & Units 08/15/2023   11:31 AM 07/02/2023    4:23 AM 07/01/2023    6:02 AM  CMP  Glucose 70 - 99 mg/dL 102  725  366   BUN 8 - 23 mg/dL 30  68  80   Creatinine 0.44 - 1.00 mg/dL 4.40  3.47  4.25   Sodium 135 - 145 mmol/L 137  140  139   Potassium 3.5 - 5.1 mmol/L 3.7  3.1  3.6   Chloride 98 - 111 mmol/L 96  101  99   CO2 22 - 32 mmol/L 33  29  27   Calcium 8.9 - 10.3 mg/dL 8.8  9.3  9.4   Total Protein 6.5 -  8.1 g/dL 7.3  6.4    Total Bilirubin 0.3 - 1.2 mg/dL 0.5  1.2    Alkaline Phos 38 - 126 U/L 105  104    AST 15 - 41 U/L 17  19    ALT 0 - 44 U/L 12  17       Lab Results  Component Value Date   CEA1 1.8  08/15/2023   /  CEA  Date Value Ref Range Status  08/15/2023 1.8 0.0 - 4.7 ng/mL Final    Comment:    (NOTE)                             Nonsmokers          <3.9                             Smokers             <5.6 [employer] Electrochemiluminescence Immunoassay (ECLIA) Values obtained with different assay methods or kits cannot be used interchangeably.  Results cannot be interpreted as absolute evidence of the presence or absence of malignant disease. Performed At: [facility] [address] [place], [place] [id number] Jolene Schimke MD [id number]    No results found for: "PSA1" No results found for: "340-531-0514" No results found for: "CAN125"  No results found for: "TOTALPROTELP", "ALBUMINELP", "A1GS", "A2GS", "BETS", "BETA2SER", "GAMS", "MSPIKE", "SPEI" Lab Results  Component Value Date   TIBC 225 (L) 05/04/2023   TIBC 257 01/25/2023   TIBC 257 10/13/2022   FERRITIN 47 05/04/2023   FERRITIN 44 01/25/2023   FERRITIN 18 10/13/2022   IRONPCTSAT 42 (H) 05/04/2023   IRONPCTSAT 27 01/25/2023   IRONPCTSAT 28 10/13/2022   No results found for: "LDH"   STUDIES:   No results found.

## 2023-08-25 DIAGNOSIS — Z79899 Other long term (current) drug therapy: Secondary | ICD-10-CM | POA: Diagnosis not present

## 2023-08-25 DIAGNOSIS — R300 Dysuria: Secondary | ICD-10-CM | POA: Diagnosis not present

## 2023-08-25 DIAGNOSIS — N390 Urinary tract infection, site not specified: Secondary | ICD-10-CM | POA: Diagnosis not present

## 2023-08-29 DIAGNOSIS — Z7984 Long term (current) use of oral hypoglycemic drugs: Secondary | ICD-10-CM | POA: Diagnosis not present

## 2023-08-29 DIAGNOSIS — L602 Onychogryphosis: Secondary | ICD-10-CM | POA: Diagnosis not present

## 2023-08-29 DIAGNOSIS — E1151 Type 2 diabetes mellitus with diabetic peripheral angiopathy without gangrene: Secondary | ICD-10-CM | POA: Diagnosis not present

## 2023-08-29 DIAGNOSIS — L603 Nail dystrophy: Secondary | ICD-10-CM | POA: Diagnosis not present

## 2023-09-06 DIAGNOSIS — E1165 Type 2 diabetes mellitus with hyperglycemia: Secondary | ICD-10-CM | POA: Diagnosis not present

## 2023-09-06 DIAGNOSIS — S91301A Unspecified open wound, right foot, initial encounter: Secondary | ICD-10-CM | POA: Diagnosis not present

## 2023-09-06 DIAGNOSIS — Z794 Long term (current) use of insulin: Secondary | ICD-10-CM | POA: Diagnosis not present

## 2023-09-06 DIAGNOSIS — S91309A Unspecified open wound, unspecified foot, initial encounter: Secondary | ICD-10-CM | POA: Diagnosis not present

## 2023-09-06 NOTE — Progress Notes (Signed)
Name: [name] DOB: 1961/01/25 MRN: [id number]  History of Present Illness: [name] is a 62 y.o. female who presents today for follow up visit at [facility]. She is accompanied by her husband [name]. - GU / GYN history includes: 1. Recurrent UTIs. 2. OAB with urinary frequency, nocturia, urgency, and urge incontinence. 3. Stress urinary incontinence. 4. Uterine prolapse. 5. Right renal cyst. - 06/07/2023: RUS showed a exophytic 2.4 cm right renal cyst; no GU stones, masses, or hydronephrosis.  Urine culture results in past 12 months: - 09/06/2022: Positive for Klebsiella pneumoniae - No additional urine culture results in past 12 months found per chart review.   At last visit on 08/04/2023: - Denied symptomatic improvement with Myrbetriq 50 mg daily. - Reported dysuria and persistent OAB symptoms. Leaking large volumes of urine about 3x/day on average; using diapers during the day and bed pads overnight.  - The plan was: 1. Urine culture (does not appear to have been sent unfortunately). 2. Empiric treatment for possible UTI with Augmentin 500 2x/day x7 days (renal dosing due to CKD). 3. Continue to minimize caffeine intake. 4. Work on timed voiding every 2-3 hours during the day. 5. Surgery request submitted for bladder Botox procedure.   Since last visit: Patient decided to defer scheduling bladder Botox procedure.  Today: She reports suspected acute UTI due to recent dysuria and fatigue. Also continues to have persistent frequency, nocturia, urgency, and urge incontinence. She denies fevers, gross hematuria, straining to void, or sensations of incomplete emptying.   Fall Screening: Do you usually have a device to assist in your mobility? Yes   Medications: Current Outpatient Medications  Medication Sig Dispense Refill   acetaminophen (TYLENOL) 650 MG CR tablet Take 650 mg by mouth every 8 (eight) hours as needed for pain.     albuterol  (VENTOLIN HFA) 108 (90 Base) MCG/ACT inhaler Inhale 2 puffs into the lungs every 6 (six) hours as needed for wheezing or shortness of breath.     alum & mag hydroxide-simeth (MAALOX/MYLANTA) 200-200-20 MG/5ML suspension Take 30 mLs by mouth every 2 (two) hours as needed for indigestion or heartburn. Do not exceed 4 doses in 24 hours     amLODipine (NORVASC) 5 MG tablet Take 5 mg by mouth daily.     amoxicillin-clavulanate (AUGMENTIN) 500-125 MG tablet Take 1 tablet by mouth in the morning and at bedtime. 14 tablet 0   ascorbic acid (VITAMIN C) 500 MG tablet Take 500 mg by mouth daily.     aspirin EC 81 MG tablet Take 81 mg by mouth daily. Swallow whole.     atorvastatin (LIPITOR) 10 MG tablet Take 10 mg by mouth at bedtime.     Biotin 10 MG TABS Take 1 tablet by mouth daily.     cetirizine (ZYRTEC) 10 MG tablet Take 10 mg by mouth daily.     Cholecalciferol (VITAMIN D) 50 MCG (2000 UT) CAPS Take 2,000 Units by mouth daily.     clotrimazole-betamethasone (LOTRISONE) cream Apply 1 Application topically 2 (two) times daily.     Cranberry 600 MG TABS Take 300 mg by mouth 2 (two) times daily. Half tab bid     docusate sodium (COLACE) 100 MG capsule Take 100 mg by mouth daily.     Emollient (EUCERIN) lotion Apply 1 Application topically every 12 (twelve) hours as needed for dry skin.     estradiol (ESTRACE) 0.1 MG/GM vaginal cream Discard plastic applicator. Insert a blueberry size amount (  approximately 1 gram) of cream on fingertip inside vagina at bedtime every night for 1 week then every other night routinely (for long term use). 30 g 3   ferrous sulfate 325 (65 FE) MG tablet Take 325 mg by mouth daily.     fluticasone furoate-vilanterol (BREO ELLIPTA) 100-25 MCG/ACT AEPB Inhale 1 puff into the lungs daily. 180 each 5   furosemide (LASIX) 20 MG tablet Take 20 mg by mouth daily.     gabapentin (NEURONTIN) 300 MG capsule Take 1 capsule in AM, 2 capsules in PM (Patient taking differently: Take 300-600 mg  by mouth in the morning and at bedtime. Take 1 capsule in AM, 2 capsules in PM) 90 capsule 11   Glucerna (GLUCERNA) LIQD Take 237 mLs by mouth daily. For wound healing     hydrALAZINE (APRESOLINE) 25 MG tablet Take 1 tablet (25 mg total) by mouth every 8 (eight) hours.     ipratropium-albuterol (DUONEB) 0.5-2.5 (3) MG/3ML SOLN Take 3 mLs by nebulization every 4 (four) hours as needed.     lansoprazole (PREVACID) 30 MG capsule Take 30 mg by mouth 2 (two) times daily before a meal.     LANTUS 100 UNIT/ML injection Inject 0.5 mLs (50 Units total) into the skin at bedtime. 45 mL 3   levofloxacin (LEVAQUIN) 750 MG tablet Take 750 mg by mouth daily.     levothyroxine (SYNTHROID) 88 MCG tablet Take 88 mcg by mouth daily before breakfast.     Menthol-Zinc Oxide (CALMOSEPTINE) 0.44-20.6 % OINT Apply 1 Application topically as needed (preservation/protection after incontinent care).     MYRBETRIQ 50 MG TB24 tablet Take 1 tablet (50 mg total) by mouth daily. 30 tablet 11   NOVOLOG 100 UNIT/ML injection Inject 10-16 Units into the skin 3 (three) times daily with meals. 90-150= 10 units; 151-200= 11 units; 201-250= 12 units; 251-300= 13 units; 301-350= 14 units; 351-400= 15 units; above 400 = 16 units 45 mL 3   nystatin cream (MYCOSTATIN) Apply 1 Application topically 2 (two) times daily.     ondansetron (ZOFRAN) 4 MG tablet Take 8 mg by mouth 2 (two) times daily.     potassium chloride SA (K-DUR) 20 MEQ tablet Take 20 mEq by mouth daily.     PROCRIT 4000 UNIT/ML injection Inject 4,000 Units into the skin every Monday, Wednesday, and Friday. Hold if hemoglobin is > 10     propranolol (INDERAL) 80 MG tablet Take 80 mg by mouth daily.     saccharomyces boulardii (FLORASTOR) 250 MG capsule Take 250 mg by mouth 2 (two) times daily.     SANTYL 250 UNIT/GM ointment Apply 1 Application topically daily.     sertraline (ZOLOFT) 100 MG tablet Take 100 mg by mouth daily.     trimethoprim (TRIMPEX) 100 MG tablet Take 1  tablet (100 mg total) by mouth daily. 30 tablet 11   No current facility-administered medications for this visit.    Allergies: Allergies  Allergen Reactions   Carvedilol Palpitations   Benicar [Olmesartan] Swelling   Codeine Other (See Comments)    Confusion    Sulfa Antibiotics Swelling    Whole face swells   Trulicity [Dulaglutide] Diarrhea   Ceftriaxone Hives and Rash    Past Medical History:  Diagnosis Date   Anemia    Arthritis    Asthma    Cancer of sigmoid (HCC)    COPD (chronic obstructive pulmonary disease) (HCC)    Depression    Diabetes mellitus  Diastolic dysfunction 05/15/2015   Dyspnea    Generalized weakness    Hyperlipidemia    Hypertension    Hypothyroidism    Obesity    Palpitations    Pneumonia    PONV (postoperative nausea and vomiting)    Prolonged QT interval 05/14/2015   Sleep apnea    Past Surgical History:  Procedure Laterality Date   BIOPSY  04/06/2022   Procedure: BIOPSY;  Surgeon: Dolores Frame, MD;  [facility]: [facility];  Service: Gastroenterology;;   CATARACT EXTRACTION     CESAREAN SECTION     CHOLECYSTECTOMY     COLONOSCOPY WITH PROPOFOL N/A 04/06/2022   Procedure: COLONOSCOPY WITH PROPOFOL;  Surgeon: Dolores Frame, MD;  Location: [facility];  Service: Gastroenterology;  Laterality: N/A;  805 ASA 2 patient in [facility]   ESOPHAGOGASTRODUODENOSCOPY (EGD) WITH PROPOFOL N/A 04/06/2022   Procedure: ESOPHAGOGASTRODUODENOSCOPY (EGD) WITH PROPOFOL;  Surgeon: Dolores Frame, MD;  [facility]: [facility];  Service: Gastroenterology;  Laterality: N/A;   HEMOSTASIS CLIP PLACEMENT  04/06/2022   Procedure: HEMOSTASIS CLIP PLACEMENT;  Surgeon: Dolores Frame, MD;  [facility]: [facility];  Service: Gastroenterology;;   POLYPECTOMY  04/06/2022   Procedure: POLYPECTOMY;  Surgeon: Marguerita Merles, Reuel Boom, MD;  Location: [facility];  Service: Gastroenterology;;   SUBMUCOSAL  TATTOO INJECTION  04/06/2022   Procedure: SUBMUCOSAL TATTOO INJECTION;  Surgeon: Marguerita Merles, Reuel Boom, MD;  Location: [facility];  Service: Gastroenterology;;   Family History  Problem Relation Age of Onset   Stroke Mother    Diabetes Father    Heart failure Father    Hypertension Father    Diabetes Sister    Heart failure Sister    Hypertension Sister    Stroke Sister    Cancer Other    Stroke Sister    Social History   Socioeconomic History   Marital status: Married    Spouse name: Not on file   Number of children: Not on file   Years of education: Not on file   Highest education level: Not on file  Occupational History   Not on file  Tobacco Use   Smoking status: Never    Passive exposure: Yes   Smokeless tobacco: Never  Vaping Use   Vaping status: Never Used  Substance and Sexual Activity   Alcohol use: No    Alcohol/week: 0.0 standard drinks of alcohol   Drug use: No   Sexual activity: Yes    Birth control/protection: None  Other Topics Concern   Not on file  Social History Narrative   Lives with husband.  One living son.  Daughter died after transplant age 45.     Are you right handed or left handed? Right   Are you currently employed ? 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    What type of home do you live in: 1 story or 2 story? one       Social Determinants of Health   Financial Resource Strain: Medium Risk (08/14/2021)   Received from [facility], [facility]   Overall Financial Resource Strain (CARDIA)    Difficulty of Paying Living Expenses: Somewhat hard  Food Insecurity: Patient Declined (08/12/2022)   Hunger Vital Sign    Worried About Running Out of Food in the Last Year: Patient declined    Ran Out of Food in the Last Year: Patient declined  Transportation Needs: No Transportation Needs (08/12/2022)   PRAPARE - Administrator, Civil Service (Medical): No    Lack of  Transportation (Non-Medical): No  Physical Activity: Inactive (08/14/2021)   Received from [facility], [facility]   Exercise Vital Sign    Days of Exercise per Week: 0 days    Minutes of Exercise per Session: 0 min  Stress: Stress Concern Present (08/14/2021)   Received from [facility], [facility] - Occupational Stress Questionnaire    Feeling of Stress : Very much  Social Connections: Unknown (04/08/2022)   Received from [facility], [facility]   Social Network    Social Network: Not on file  Intimate Partner Violence: Unknown (08/12/2022)   Humiliation, Afraid, Rape, and Kick questionnaire    Fear of Current or Ex-Partner: No    Emotionally Abused: No    Physically Abused: Not on file    Sexually Abused: Not on file    Review of Systems Constitutional: Patient reports fatigue / general malaise lntegumentary: Patient denies any rashes or pruritus Cardiovascular: Patient denies chest pain or syncope Respiratory: Patient denies shortness of breath Gastrointestinal: Patient denies nausea, vomiting, constipation, or diarrhea Musculoskeletal: Patient denies muscle cramps or weakness Neurologic: Patient denies convulsions or seizures Allergic/Immunologic: Patient denies recent allergic reaction(s) Hematologic/Lymphatic: Patient denies bleeding tendencies Endocrine: Patient denies heat/cold intolerance  GU: As per HPI.  OBJECTIVE Vitals:   09/09/23 1230  BP: (!) 148/76  Pulse: 65  Temp: 98 F (36.7 C)   There is no height or weight on file to calculate BMI.  Physical Examination Constitutional: No obvious distress; patient is non-toxic appearing  Cardiovascular: No visible lower extremity edema.  Respiratory: The patient does not have audible wheezing/stridor; respirations do not appear labored  Gastrointestinal: Abdomen non-distended Musculoskeletal: Normal ROM of UEs  Skin: No obvious rashes/open sores  Neurologic:  CN 2-12 grossly intact Psychiatric: Answered questions appropriately with normal affect  Hematologic/Lymphatic/Immunologic: No obvious bruises or sites of spontaneous bleeding  UA: >30 WBC/hpf, 3-10 RBC/hpf, bacteria (many)  ASSESSMENT UTI symptoms  Recurrent UTI - Plan: estradiol (ESTRACE) 0.1 MG/GM vaginal cream, trimethoprim (TRIMPEX) 100 MG tablet, amoxicillin-clavulanate (AUGMENTIN) 500-125 MG tablet, Urine Culture, Urinalysis, Routine w reflex microscopic  Stress incontinence of urine - Plan: Urine Culture, Urinalysis, Routine w reflex microscopic  Urge incontinence - Plan: Urine Culture, Urinalysis, Routine w reflex microscopic  OAB (overactive bladder) - Plan: Urine Culture, Urinalysis, Routine w reflex microscopic  Urinary frequency - Plan: Urine Culture, Urinalysis, Routine w reflex microscopic  Urinary urgency - Plan: Urine Culture, Urinalysis, Routine w reflex microscopic  Ambulatory dysfunction - Plan: Urine Culture, Urinalysis, Routine w reflex microscopic  We discussed evidence of UTI today. Will check urine culture and treat empirically with Augmentin (renal dosing) while awaiting culture results and sensitivities. We discussed that her recurrent UTIs are likely exacerbating her OAB symptoms therefore we agreed to prioritize recurrent UTI management first; once infections are under control will then determine appropriate OAB management (such as bladder Botox). For now she was advised to continue working on behavioral management for OAB including timed voiding and minimizing caffeine intake.  We discussed the possible etiologies of recurrent UTls including ascending infection related to intercourse; vaginal atrophy; transmural infection that has been treated incompletely; urinary tract stones; incomplete bladder emptying with urinary stasis; kidney or bladder tumor; urethral diverticulum; and colonization of  vagina and urinary tract with pathologic, adherent organisms.    Handout provided  about behaviors and OTC supplement options for UTI prevention.  Advised patient to start routine use of topical vaginal estrogen for likely vaginal atrophy to aid with UTI prevention. The etiology and consequences of urogenital epithelial atrophy was explained to patient. The thinning of the epithelium of the urethra can contribute to urinary urgency and frequency syndromes. In addition, the normal bacterial flora that colonizes the perineum may contribute to UTI risk because the thin urethral epithelium allows the bacteria to become adherent and the change in vaginal pH can disrupt the vaginal / urethral microbiome and allow for bacterial overgrowth. Patient was advised that topical vaginal estrogen replacement will take about 3 months to restore the vaginal pH.  Also advised UTI prophylaxis with a daily low dose antibiotic (Trimethoprim). We discussed the potential risks of prolonged antibiotic treatment particularly with the risks of developing antibiotic resistance.   Will plan for follow up in 3 months or sooner if needed. Pt verbalized understanding and agreement. All questions were answered.  PLAN Advised the following: 1. Urine culture. 2. Start Augmentin 500 2x/day x7 days for suspected acute UTI (renal dosing due to CKD). 3. After completing Augmentin, start taking Trimethoprim 100 mg daily for UTI prevention.  4. Start use of topical vaginal estrogen cream every other night (long term use) for UTI prevention. 5. Timed voiding every 1-2 hours.  6. Minimize caffeine intake. 7. Return in about 3 months (around 12/10/2023) for UA, PVR, & f/u with Evette Georges NP.  Orders Placed This Encounter  Procedures   Urine Culture   Urinalysis, Routine w reflex microscopic    It has been explained that the patient is to follow regularly with their PCP in addition to all other providers involved in their care and to follow instructions provided by these respective offices.  Patient advised to contact urology clinic if any urologic-pertaining questions, concerns, new symptoms or problems arise in the interim period.  Patient Instructions  Recommendations regarding UTI prevention / management:  When UTI symptoms occur: Call urology office to request order for urine culture. We recommend waiting for urine culture result prior to use of any antibiotics.  For bladder pain/ burning with urination: Over the counter Pyridium (phenazopyridine) as needed (commonly known under the "AZO" brand). No more than 3 days consecutively at a time due to risk for methemoglobinemia, liver function issues, and bone health damage with long term use of Pyridium.  Routine use for UTI prevention: - Low dose antibiotic daily for UTI prophylaxis. - Topical vaginal estrogen for vaginal atrophy. Adequate fluid intake (>1.5 liters/day) to flush out the urinary tract. - Go to the bathroom to urinate every 4-6 hours while awake to minimize urinary stasis / bacterial overgrowth in the bladder. - Proanthocyanidin (PAC) supplement 36 mg daily; must be soluble (insoluble form of PAC will be ineffective). Recommended brand: Ellura. This is an over-the-counter supplement (often must be found/ purchased online) supplement derived from cranberries with concentrated active component: Proanthocyanidin (PAC) 36 mg daily. Decreases bacterial adherence to bladder lining. Not recommended for patients with interstitial cystitis due to acidity. - Vitamin C supplement to acidify urine to minimize bacterial growth. Not recommended for patients with interstitial cystitis due to acidity. - Probiotic to maintain healthy vaginal microbiome to suppress bacteria at urethral opening. Brand recommendations: Feminine Balance (highest concentration of lactobacillus) or Hyperbiotic Pro 15.  Note for patients with diabetes: You may read about D-mannose powder for UTI prevention. That is an over the-counter supplement which  decreases bacterial adherence to  bladder lining. I would NOT advise that for you as a person with diabetes due to its sugar content.   Electronically signed by:  Donnita Falls, FNP   09/09/23    1:23 PM

## 2023-09-09 ENCOUNTER — Encounter: Payer: Self-pay | Admitting: Urology

## 2023-09-09 ENCOUNTER — Ambulatory Visit (INDEPENDENT_AMBULATORY_CARE_PROVIDER_SITE_OTHER): Payer: Medicare HMO | Admitting: Urology

## 2023-09-09 VITALS — BP 148/76 | HR 65 | Temp 98.0°F

## 2023-09-09 DIAGNOSIS — R3915 Urgency of urination: Secondary | ICD-10-CM

## 2023-09-09 DIAGNOSIS — N3946 Mixed incontinence: Secondary | ICD-10-CM | POA: Diagnosis not present

## 2023-09-09 DIAGNOSIS — R350 Frequency of micturition: Secondary | ICD-10-CM

## 2023-09-09 DIAGNOSIS — N393 Stress incontinence (female) (male): Secondary | ICD-10-CM | POA: Diagnosis not present

## 2023-09-09 DIAGNOSIS — R262 Difficulty in walking, not elsewhere classified: Secondary | ICD-10-CM | POA: Diagnosis not present

## 2023-09-09 DIAGNOSIS — N390 Urinary tract infection, site not specified: Secondary | ICD-10-CM | POA: Diagnosis not present

## 2023-09-09 DIAGNOSIS — N3281 Overactive bladder: Secondary | ICD-10-CM | POA: Diagnosis not present

## 2023-09-09 DIAGNOSIS — N3941 Urge incontinence: Secondary | ICD-10-CM | POA: Diagnosis not present

## 2023-09-09 DIAGNOSIS — R399 Unspecified symptoms and signs involving the genitourinary system: Secondary | ICD-10-CM | POA: Diagnosis not present

## 2023-09-09 LAB — URINALYSIS, ROUTINE W REFLEX MICROSCOPIC
Bilirubin, UA: NEGATIVE
Ketones, UA: NEGATIVE
Nitrite, UA: POSITIVE — AB
Specific Gravity, UA: 1.02 (ref 1.005–1.030)
Urobilinogen, Ur: 0.2 mg/dL (ref 0.2–1.0)
pH, UA: 7 (ref 5.0–7.5)

## 2023-09-09 LAB — MICROSCOPIC EXAMINATION: WBC, UA: >30 /[HPF] — AB (ref 0–5)

## 2023-09-09 MED ORDER — TRIMETHOPRIM 100 MG PO TABS
100.0000 mg | ORAL_TABLET | Freq: Every day | ORAL | 11 refills | Status: AC
Start: 2023-09-09 — End: ?

## 2023-09-09 MED ORDER — AMOXICILLIN-POT CLAVULANATE 500-125 MG PO TABS
1.0000 | ORAL_TABLET | Freq: Two times a day (BID) | ORAL | 0 refills | Status: DC
Start: 2023-09-09 — End: 2023-09-30

## 2023-09-09 MED ORDER — ESTRADIOL 0.1 MG/GM VA CREA
TOPICAL_CREAM | VAGINAL | 3 refills | Status: AC
Start: 2023-09-09 — End: ?

## 2023-09-09 NOTE — Patient Instructions (Signed)
Recommendations regarding UTI prevention / management:  When UTI symptoms occur: Call urology office to request order for urine culture. We recommend waiting for urine culture result prior to use of any antibiotics.  For bladder pain/ burning with urination: Over the counter Pyridium (phenazopyridine) as needed (commonly known under the "AZO" brand). No more than 3 days consecutively at a time due to risk for methemoglobinemia, liver function issues, and bone health damage with long term use of Pyridium.  Routine use for UTI prevention: - Low dose antibiotic daily for UTI prophylaxis. - Topical vaginal estrogen for vaginal atrophy. Adequate fluid intake {>1.5 liters/day) to flush out the urinary tract. - Go to the bathroom to urinate every 4-6 hours while awake to minimize urinary stasis / bacterial overgrowth in the bladder. - Proanthocyanidin (PAC) supplement 36 mg daily; must be soluble (insoluble form of PAC will be ineffective). Recommended brand: Ellura. This is an over-the-counter supplement (often must be found/ purchased online) supplement derived from cranberries with concentrated active component: Proanthocyanidin (PAC) 36 mg daily. Decreases bacterial adherence to bladder lining. Not recommended for patients with interstitial cystitis due to acidity. - Vitamin C supplement to acidify urine to minimize bacterial growth. Not recommended for patients with interstitial cystitis due to acidity. - Probiotic to maintain healthy vaginal microbiome to suppress bacteria at urethral opening. Brand recommendations: Feminine Balance (highest concentration of lactobacillus) or Hyperbiotic Pro 15.  Note for patients with diabetes: You may read about D-mannose powder for UTI prevention. That is an over the-counter supplement which decreases bacterial adherence to bladder lining. I would NOT advise that for you as a person with diabetes due to its sugar content.  

## 2023-09-12 DIAGNOSIS — Z79899 Other long term (current) drug therapy: Secondary | ICD-10-CM | POA: Diagnosis not present

## 2023-09-12 DIAGNOSIS — N3281 Overactive bladder: Secondary | ICD-10-CM | POA: Diagnosis not present

## 2023-09-12 DIAGNOSIS — N390 Urinary tract infection, site not specified: Secondary | ICD-10-CM | POA: Diagnosis not present

## 2023-09-13 DIAGNOSIS — L97411 Non-pressure chronic ulcer of right heel and midfoot limited to breakdown of skin: Secondary | ICD-10-CM | POA: Diagnosis not present

## 2023-09-13 LAB — URINE CULTURE

## 2023-09-14 ENCOUNTER — Telehealth: Payer: Self-pay

## 2023-09-14 NOTE — Telephone Encounter (Signed)
-----   Message from [name] sent at 09/13/2023  4:31 PM EDT ----- Please let pt know urine culture result. The Augmentin which was prescribed should cover this pathogen. Thanks.

## 2023-09-14 NOTE — Telephone Encounter (Signed)
Patient was made aware through detailed voiced message.

## 2023-09-30 ENCOUNTER — Encounter: Payer: Self-pay | Admitting: Neurology

## 2023-09-30 ENCOUNTER — Ambulatory Visit (INDEPENDENT_AMBULATORY_CARE_PROVIDER_SITE_OTHER): Payer: Medicare HMO | Admitting: Neurology

## 2023-09-30 VITALS — BP 132/62 | HR 60 | Ht 61.0 in | Wt 287.0 lb

## 2023-09-30 DIAGNOSIS — L97419 Non-pressure chronic ulcer of right heel and midfoot with unspecified severity: Secondary | ICD-10-CM | POA: Diagnosis not present

## 2023-09-30 DIAGNOSIS — M21371 Foot drop, right foot: Secondary | ICD-10-CM

## 2023-09-30 DIAGNOSIS — G629 Polyneuropathy, unspecified: Secondary | ICD-10-CM | POA: Diagnosis not present

## 2023-09-30 DIAGNOSIS — Z79899 Other long term (current) drug therapy: Secondary | ICD-10-CM | POA: Diagnosis not present

## 2023-09-30 DIAGNOSIS — E11621 Type 2 diabetes mellitus with foot ulcer: Secondary | ICD-10-CM | POA: Diagnosis not present

## 2023-09-30 MED ORDER — GABAPENTIN 300 MG PO CAPS: ORAL_CAPSULE | ORAL | 3 refills | Status: AC

## 2023-09-30 NOTE — Patient Instructions (Signed)
Good to see you.  Reduce Neurontin (Gabapentin) 300mg : take 1 capsule in AM, 1 capsule in PM  2. Start using wrist brace regularly to help protect nerves affected by carpal tunnel syndrome  3. Continue follow-up with Hand Surgeon as scheduled  4. Follow-up in 6 months, call for any changes

## 2023-09-30 NOTE — Progress Notes (Signed)
NEUROLOGY FOLLOW UP OFFICE NOTE  [name] [id number] 06-Feb-1961  HISTORY OF PRESENT ILLNESS: I had the pleasure of seeing [name] in follow-up in the neurology clinic on 09/30/2023.  The patient was last seen 6 months ago. She is accompanied by [facility] who helps supplement the history today.  Records and images were personally reviewed where available.  She was initially seen for dizziness described as a sensation of imbalance with frequent falls. She also had a right foot drop. 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 It also showed very severe right medial neuropathy at the wrist. She had an MRI lumbar spine without contrast 12/2022 which did not show any significant stenosis, there was mild disc bulging and facet hypertrophy, chronic fatty atrophy of the erector spinae musculature bilaterally.  She has seen Ortho hand who repeated EMG/NCV showing bilateral carpal tunnel syndrome. Surgery was discussed however she was recovering from pneumonia on August visit. She has an appointment next month. She continues to notice numbness in the right hand, dropping things. Her right foot is wrapped in gauze and in a soft brace, she reports there are slow-healing blisters. She denies any pain. She is on Gabapentin 300mg  in AM, 600mg  in PM (dose increased on last visit) and reports daytime drowsiness. No falls.    History on Initial Assessment 10/19/2022: This is a very pleasant 62 year old right-handed woman with a history of DM, hypertension, hyperlipidemia, hypothyroidism, COPD, colon cancer, presenting for evaluation of dizziness. She was recently evaluated by ENT, no signs of peripheral cause of vertigo, suspect  central cause of dizziness. She and her husband report that symptoms started around 4 years ago when she suddenly started having frequent falls. She describes the dizziness as more of a balance issue. She denies any vertigo or lightheadedness. Symptoms only occur when standing, she does not feel "dizzy" when supine or sitting. She denies any headaches, double vision, difficulty swallowing. No neck or back pain. They report her last fall was in December 2022, at that time her right leg went numb and she fell. She has been at [facility] since February 2023 due to the frequent falls. They report she has not fallen since then because she has not done much walking, she is mostly in the wheelchair or uses a walker. She has had numbness in both feet and her right hand for several years. She describes constant numbness and tingling in her feet and right hand, with stabbing pain in her feet and achiness in her right hand. She cannot flex her right foot and states her toes are curling in. She has been diabetic "since age 87." She states glucose levels are "bad," she thinks her last HbA1c was 9 (7.2 in 07/2022). At the SNF, glucose levels go up to 300, but are low when she wakes up. She reports insulin dose was changed recently and she is on a strict diet. She has been doing physical therapy for the past 6 months but recently finished.   She had a head CT in 12/2021 ordered for altered mental status in the setting of hypoglycemia ("glucose <10"), no acute changes, there was atrophy and chronic microvascular disease.   PAST MEDICAL HISTORY: Past Medical History:  Diagnosis Date   Anemia  Arthritis    Asthma    Cancer of sigmoid (HCC)    COPD (chronic obstructive pulmonary disease) (HCC)    Depression    Diabetes mellitus    Diastolic dysfunction 05/15/2015   Dyspnea    Generalized weakness    Hyperlipidemia    Hypertension    Hypothyroidism    Obesity    Palpitations    Pneumonia    PONV  (postoperative nausea and vomiting)    Prolonged QT interval 05/14/2015   Sleep apnea     MEDICATIONS: Current Outpatient Medications on File Prior to Visit  Medication Sig Dispense Refill   acetaminophen (TYLENOL) 650 MG CR tablet Take 650 mg by mouth every 8 (eight) hours as needed for pain.     albuterol (VENTOLIN HFA) 108 (90 Base) MCG/ACT inhaler Inhale 2 puffs into the lungs every 6 (six) hours as needed for wheezing or shortness of breath.     alum & mag hydroxide-simeth (MAALOX/MYLANTA) 200-200-20 MG/5ML suspension Take 30 mLs by mouth every 2 (two) hours as needed for indigestion or heartburn. Do not exceed 4 doses in 24 hours     amLODipine (NORVASC) 5 MG tablet Take 5 mg by mouth daily.     amoxicillin-clavulanate (AUGMENTIN) 500-125 MG tablet Take 1 tablet by mouth in the morning and at bedtime. 14 tablet 0   ascorbic acid (VITAMIN C) 500 MG tablet Take 500 mg by mouth daily.     aspirin EC 81 MG tablet Take 81 mg by mouth daily. Swallow whole.     atorvastatin (LIPITOR) 10 MG tablet Take 10 mg by mouth at bedtime.     Biotin 10 MG TABS Take 1 tablet by mouth daily.     cetirizine (ZYRTEC) 10 MG tablet Take 10 mg by mouth daily.     Cholecalciferol (VITAMIN D) 50 MCG (2000 UT) CAPS Take 2,000 Units by mouth daily.     clotrimazole-betamethasone (LOTRISONE) cream Apply 1 Application topically 2 (two) times daily.     Cranberry 600 MG TABS Take 300 mg by mouth 2 (two) times daily. Half tab bid     docusate sodium (COLACE) 100 MG capsule Take 100 mg by mouth daily.     Emollient (EUCERIN) lotion Apply 1 Application topically every 12 (twelve) hours as needed for dry skin.     estradiol (ESTRACE) 0.1 MG/GM vaginal cream Discard plastic applicator. Insert a blueberry size amount (approximately 1 gram) of cream on fingertip inside vagina at bedtime every night for 1 week then every other night routinely (for long term use). 30 g 3   ferrous sulfate 325 (65 FE) MG tablet Take 325 mg by  mouth daily.     fluticasone furoate-vilanterol (BREO ELLIPTA) 100-25 MCG/ACT AEPB Inhale 1 puff into the lungs daily. 180 each 5   furosemide (LASIX) 20 MG tablet Take 20 mg by mouth daily.     gabapentin (NEURONTIN) 300 MG capsule Take 1 capsule in AM, 2 capsules in PM (Patient taking differently: Take 300-600 mg by mouth in the morning and at bedtime. Take 1 capsule in AM, 2 capsules in PM) 90 capsule 11   Glucerna (GLUCERNA) LIQD Take 237 mLs by mouth daily. For wound healing     hydrALAZINE (APRESOLINE) 25 MG tablet Take 1 tablet (25 mg total) by mouth every 8 (eight) hours.     ipratropium-albuterol (DUONEB) 0.5-2.5 (3) MG/3ML SOLN Take 3 mLs by nebulization every 4 (four) hours as needed.     lansoprazole (PREVACID) 30 MG  capsule Take 30 mg by mouth 2 (two) times daily before a meal.     LANTUS 100 UNIT/ML injection Inject 0.5 mLs (50 Units total) into the skin at bedtime. 45 mL 3   levofloxacin (LEVAQUIN) 750 MG tablet Take 750 mg by mouth daily.     levothyroxine (SYNTHROID) 88 MCG tablet Take 88 mcg by mouth daily before breakfast.     Menthol-Zinc Oxide (CALMOSEPTINE) 0.44-20.6 % OINT Apply 1 Application topically as needed (preservation/protection after incontinent care).     MYRBETRIQ 50 MG TB24 tablet Take 1 tablet (50 mg total) by mouth daily. 30 tablet 11   NOVOLOG 100 UNIT/ML injection Inject 10-16 Units into the skin 3 (three) times daily with meals. 90-150= 10 units; 151-200= 11 units; 201-250= 12 units; 251-300= 13 units; 301-350= 14 units; 351-400= 15 units; above 400 = 16 units 45 mL 3   nystatin cream (MYCOSTATIN) Apply 1 Application topically 2 (two) times daily.     ondansetron (ZOFRAN) 4 MG tablet Take 8 mg by mouth 2 (two) times daily.     potassium chloride SA (K-DUR) 20 MEQ tablet Take 20 mEq by mouth daily.     PROCRIT 4000 UNIT/ML injection Inject 4,000 Units into the skin every Monday, Wednesday, and Friday. Hold if hemoglobin is > 10     propranolol (INDERAL) 80 MG  tablet Take 80 mg by mouth daily.     saccharomyces boulardii (FLORASTOR) 250 MG capsule Take 250 mg by mouth 2 (two) times daily.     SANTYL 250 UNIT/GM ointment Apply 1 Application topically daily.     sertraline (ZOLOFT) 100 MG tablet Take 100 mg by mouth daily.     trimethoprim (TRIMPEX) 100 MG tablet Take 1 tablet (100 mg total) by mouth daily. 30 tablet 11   No current facility-administered medications on file prior to visit.    ALLERGIES: Allergies  Allergen Reactions   Carvedilol Palpitations   Benicar [Olmesartan] Swelling   Codeine Other (See Comments)    Confusion    Sulfa Antibiotics Swelling    Whole face swells   Trulicity [Dulaglutide] Diarrhea   Ceftriaxone Hives and Rash    FAMILY HISTORY: Family History  Problem Relation Age of Onset   Stroke Mother    Diabetes Father    Heart failure Father    Hypertension Father    Diabetes Sister    Heart failure Sister    Hypertension Sister    Stroke Sister    Cancer Other    Stroke Sister     SOCIAL HISTORY: Social History   Socioeconomic History   Marital status: Married    Spouse name: Not on file   Number of children: Not on file   Years of education: Not on file   Highest education level: Not on file  Occupational History   Not on file  Tobacco Use   Smoking status: Never    Passive exposure: Yes   Smokeless tobacco: Never  Vaping Use   Vaping status: Never Used  Substance and Sexual Activity   Alcohol use: No    Alcohol/week: 0.0 standard drinks of alcohol   Drug use: No   Sexual activity: Yes    Birth control/protection: None  Other Topics Concern   Not on file  Social History Narrative   Lives with husband.  One living son.  Daughter died after transplant age 41.     Are you right handed or left handed? Right   Are you currently employed ?  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    What type of home do you live in: 1 story or 2  story? one       Social Determinants of Health   Financial Resource Strain: Medium Risk (08/14/2021)   Received from [facility], [facility]   Overall Financial Resource Strain (CARDIA)    Difficulty of Paying Living Expenses: Somewhat hard  Food Insecurity: Patient Declined (08/12/2022)   Hunger Vital Sign    Worried About Running Out of Food in the Last Year: Patient declined    Ran Out of Food in the Last Year: Patient declined  Transportation Needs: No Transportation Needs (08/12/2022)   PRAPARE - Administrator, Civil Service (Medical): No    Lack of Transportation (Non-Medical): No  Physical Activity: Inactive (08/14/2021)   Received from [facility], [facility]   Exercise Vital Sign    Days of Exercise per Week: 0 days    Minutes of Exercise per Session: 0 min  Stress: Stress Concern Present (08/14/2021)   Received from [facility], [facility] Occupational [facility] - Occupational Stress Questionnaire    Feeling of Stress : Very much  Social Connections: Unknown (04/08/2022)   Received from [facility], [facility]   Social Network    Social Network: Not on file  Intimate Partner Violence: Unknown (08/12/2022)   Humiliation, Afraid, Rape, and Kick questionnaire    Fear of Current or Ex-Partner: No    Emotionally Abused: No    Physically Abused: Not on file    Sexually Abused: Not on file     PHYSICAL EXAM: Vitals:   09/30/23 1349  Pulse: 60  SpO2: 96%   General: No acute distress, sitting on wheelchair Head:  Normocephalic/atraumatic Skin/Extremities: No rash, right foot wrapped in gauze and soft brace Neurological Exam: alert and awake. No aphasia or dysarthria. Fund of knowledge is appropriate.  Attention and concentration are normal.   Cranial nerves: Pupils equal, round. Extraocular movements intact with no nystagmus. Visual fields full.  No facial asymmetry.  Motor: Bulk and tone normal, muscle strength 5/5 on both UE,  proximal both LE, right foot in soft brace. Finger to nose testing intact.  Gait not tested. +Tinel sign at right wrist.    IMPRESSION: This is a very pleasant 62 yo RH woman with a history of DM, hypertension, hyperlipidemia, hypothyroidism, COPD, colon cancer, who presented for dizziness described as sensation of imbalance, likely due to severe neuropathy confirmed on EMG. She denies any falls. She has seen Ortho Hand for the carpal tunnel syndrome, advised to wear wrist splints. She denies any pain and has some drowsiness, we discussed reducing Gabapentin back to 300mg  BID. Continue working on glucose control and PT. Follow-up in 6 months, call for any changes.   Thank you for allowing me to participate in her care.  Please do not hesitate to call for any questions or concerns.   Patrcia Dolly, M.D.   CC: Sharon Seller, NP

## 2023-10-04 ENCOUNTER — Encounter (HOSPITAL_BASED_OUTPATIENT_CLINIC_OR_DEPARTMENT_OTHER): Payer: Medicare HMO | Attending: General Surgery | Admitting: General Surgery

## 2023-10-04 DIAGNOSIS — Z794 Long term (current) use of insulin: Secondary | ICD-10-CM | POA: Insufficient documentation

## 2023-10-04 DIAGNOSIS — I110 Hypertensive heart disease with heart failure: Secondary | ICD-10-CM | POA: Diagnosis not present

## 2023-10-04 DIAGNOSIS — L97412 Non-pressure chronic ulcer of right heel and midfoot with fat layer exposed: Secondary | ICD-10-CM | POA: Diagnosis not present

## 2023-10-04 DIAGNOSIS — I890 Lymphedema, not elsewhere classified: Secondary | ICD-10-CM | POA: Diagnosis not present

## 2023-10-04 DIAGNOSIS — N189 Chronic kidney disease, unspecified: Secondary | ICD-10-CM | POA: Diagnosis not present

## 2023-10-04 DIAGNOSIS — Z9049 Acquired absence of other specified parts of digestive tract: Secondary | ICD-10-CM | POA: Insufficient documentation

## 2023-10-04 DIAGNOSIS — J4489 Other specified chronic obstructive pulmonary disease: Secondary | ICD-10-CM | POA: Insufficient documentation

## 2023-10-04 DIAGNOSIS — E785 Hyperlipidemia, unspecified: Secondary | ICD-10-CM | POA: Diagnosis not present

## 2023-10-04 DIAGNOSIS — I2510 Atherosclerotic heart disease of native coronary artery without angina pectoris: Secondary | ICD-10-CM | POA: Diagnosis not present

## 2023-10-04 DIAGNOSIS — E1169 Type 2 diabetes mellitus with other specified complication: Secondary | ICD-10-CM | POA: Diagnosis not present

## 2023-10-04 DIAGNOSIS — E876 Hypokalemia: Secondary | ICD-10-CM | POA: Diagnosis not present

## 2023-10-04 DIAGNOSIS — G4733 Obstructive sleep apnea (adult) (pediatric): Secondary | ICD-10-CM | POA: Diagnosis not present

## 2023-10-04 DIAGNOSIS — Z6841 Body Mass Index (BMI) 40.0 and over, adult: Secondary | ICD-10-CM | POA: Diagnosis not present

## 2023-10-04 DIAGNOSIS — I739 Peripheral vascular disease, unspecified: Secondary | ICD-10-CM | POA: Diagnosis not present

## 2023-10-04 DIAGNOSIS — L304 Erythema intertrigo: Secondary | ICD-10-CM | POA: Insufficient documentation

## 2023-10-04 DIAGNOSIS — E1040 Type 1 diabetes mellitus with diabetic neuropathy, unspecified: Secondary | ICD-10-CM | POA: Insufficient documentation

## 2023-10-04 DIAGNOSIS — I872 Venous insufficiency (chronic) (peripheral): Secondary | ICD-10-CM | POA: Diagnosis not present

## 2023-10-04 DIAGNOSIS — M1990 Unspecified osteoarthritis, unspecified site: Secondary | ICD-10-CM | POA: Diagnosis not present

## 2023-10-04 DIAGNOSIS — I130 Hypertensive heart and chronic kidney disease with heart failure and stage 1 through stage 4 chronic kidney disease, or unspecified chronic kidney disease: Secondary | ICD-10-CM | POA: Diagnosis not present

## 2023-10-04 DIAGNOSIS — E11621 Type 2 diabetes mellitus with foot ulcer: Secondary | ICD-10-CM | POA: Diagnosis not present

## 2023-10-04 DIAGNOSIS — I5032 Chronic diastolic (congestive) heart failure: Secondary | ICD-10-CM | POA: Insufficient documentation

## 2023-10-04 DIAGNOSIS — E10621 Type 1 diabetes mellitus with foot ulcer: Secondary | ICD-10-CM | POA: Insufficient documentation

## 2023-10-04 NOTE — Progress Notes (Signed)
Odor A Cleansing: fter No N/A N/A Odor Anticipated Due to Product Use: Flat and Intact N/A N/A Wound Margin: Medium (34-66%) N/A N/A Granulation A mount: Red N/A N/A Granulation Quality: Medium (34-66%) N/A N/A Necrotic Amount: Eschar, Adherent Slough N/A N/A Necrotic Tissue: Fat Layer (Subcutaneous Tissue): Yes N/A N/A Exposed Structures: Fascia: No Tendon: No Muscle: No Joint: No Bone: No Medium (34-66%) N/A N/A Epithelialization: Debridement - Excisional N/A N/A Debridement: Pre-procedure Verification/Time Out 10:05 N/A N/A Taken: Lidocaine 4% Topical Solution N/A N/A Pain Control: Subcutaneous, Slough N/A N/A Tissue Debrided: Skin/Subcutaneous Tissue N/A  N/A Level: 34.29 N/A N/A Debridement A (sq cm): rea Curette, Forceps, Scissors N/A N/A Instrument: Minimum N/A N/A Bleeding: Pressure N/A N/A Hemostasis A chieved: 0 N/A N/A Procedural Pain: 0 N/A N/A Post Procedural Pain: Procedure was tolerated well N/A N/A Debridement Treatment Response: 5.6x5.2x0.1 N/A N/A Post Debridement Measurements L x W x D (cm) 2.287 N/A N/A Post Debridement Volume: (cm) No Abnormalities Noted N/A N/A Periwound Skin Texture: Maceration: Yes N/A N/A Periwound Skin Moisture: Erythema: Yes N/A N/A Periwound Skin Color: Rubor: No Measured: 4cm N/A N/A Erythema Measurement: No Abnormality N/A N/A Temperature: Yes N/A N/A Tenderness on Palpation: Debridement N/A N/A Procedures Performed: Treatment Notes Electronic Signature(s) Signed: 10/04/2023 10:26:04 AM By: Duanne Guess MD FACS Entered By: Duanne Guess on 10/04/2023 07:26:04 -------------------------------------------------------------------------------- Multi-Disciplinary Care Plan Details Patient Name: Date of Service: [facility], [name]. 10/04/2023 9:00 A M Medical Record Number: [id number] Patient Account Number: [id number] Date of Birth/Sex: Treating RN: [birth year] (62 y.o. [name] Primary Care Iria Jamerson: Sharon Seller Other Clinician: Referring Sereniti Wan: Treating Lurlean Kernen/Extender: Charise Killian in Treatment: 0 Multidisciplinary Care Plan reviewed with physician Active Inactive Nutrition Nursing Diagnoses: Impaired glucose control: actual or potential Potential for alteratiion in Nutrition/Potential for imbalanced nutrition Goals: Patient/caregiver will maintain therapeutic glucose control [name] ([id number]) [id number] Page 6 of 9 Date Initiated: 10/04/2023 Target Resolution Date: 11/01/2023 Goal Status: Active Interventions: Provide education on elevated blood sugars and  impact on wound healing Treatment Activities: Patient referred to Primary Care Physician for further nutritional evaluation : 10/04/2023 Notes: Wound/Skin Impairment Nursing Diagnoses: Impaired tissue integrity Knowledge deficit related to ulceration/compromised skin integrity Goals: Patient/caregiver will verbalize understanding of skin care regimen Date Initiated: 10/04/2023 Target Resolution Date: 11/01/2023 Goal Status: Active Ulcer/skin breakdown will have a volume reduction of 30% by week 4 Date Initiated: 10/04/2023 Target Resolution Date: 11/01/2023 Goal Status: Active Interventions: Assess patient/caregiver ability to obtain necessary supplies Assess patient/caregiver ability to perform ulcer/skin care regimen upon admission and as needed Assess ulceration(s) every visit Provide education on ulcer and skin care Treatment Activities: Skin care regimen initiated : 10/04/2023 Topical wound management initiated : 10/04/2023 Notes: Electronic Signature(s) Signed: 10/04/2023 2:40:12 PM By: Zenaida Deed RN, BSN Entered By: Zenaida Deed on 10/04/2023 07:03:24 -------------------------------------------------------------------------------- Pain Assessment Details Patient Name: Date of Service: [facility], [name]. 10/04/2023 9:00 A M Medical Record Number: [id number] Patient Account Number: [id number] Date of Birth/Sex: Treating RN: Apr 18, 1961 (62 y.o. [name] Primary Care Timofey Carandang: Sharon Seller Other Clinician: Referring Robena Ewy: Treating Serinity Ware/Extender: Charise Killian in Treatment: 0 Active Problems Location of Pain Severity and Description of Pain Patient Has Paino Yes Site Locations Pain LocationEDNAH, [name] ([id number]) [id number].pdf Page 7 of 9 Pain Location: Pain in Ulcers With Dressing Change: Yes Duration of the Pain. Constant / Intermittento Intermittent Rate the  pain. Current Pain Level: 2 Worst  Pain Level: 8 Least Pain Level: 0 Character of Pain Describe the Pain: Aching, Throbbing Pain Management and Medication Current Pain Management: Medication: Yes Is the Current Pain Management Adequate: Adequate How does your wound impact your activities of daily livingo Sleep: Yes Electronic Signature(s) Signed: 10/04/2023 2:40:12 PM By: Zenaida Deed RN, BSN Entered By: Zenaida Deed on 10/04/2023 06:47:32 -------------------------------------------------------------------------------- Patient/Caregiver Education Details Patient Name: Date of Service: [facility], [name]. 10/29/2024andnbsp9:00 A M Medical Record Number: [id number] Patient Account Number: [id number] Date of Birth/Gender: Treating RN: 1961-02-12 (62 y.o. [name] Primary Care Physician: Sharon Seller Other Clinician: Referring Physician: Treating Physician/Extender: Charise Killian in Treatment: 0 Education Assessment Education Provided To: Patient Education Topics Provided Elevated Blood Sugar/ Impact on Healing: Handouts: Elevated Blood Sugars: How Do They Affect Wound Healing Methods: Explain/Verbal, Printed Responses: Reinforcements needed, State content correctly Wound Debridement: Handouts: Wound Debridement Methods: Explain/Verbal, Printed Responses: Reinforcements needed, State content correctly Wound/Skin Impairment: Handouts: Caring for Your Ulcer Methods: Explain/Verbal, Printed Responses: Reinforcements needed, State content correctly Electronic Signature(s) [name], KREISS [name] ([id number]) [id number] Page 8 of 9 Signed: 10/04/2023 2:40:12 PM By: Zenaida Deed RN, BSN Entered By: Zenaida Deed on 10/04/2023 07:04:14 -------------------------------------------------------------------------------- Wound Assessment Details Patient Name: Date of Service: [name], Teresa YQ [name].  10/04/2023 9:00 A M Medical Record Number: [id number] Patient Account Number: [id number] Date of Birth/Sex: Treating RN: [birth year] (62 y.o. Billy Coast, Bonita Quin Primary Care Katiya Fike: Sharon Seller Other Clinician: Referring Davionne Mastrangelo: Treating Jaylyn Iyer/Extender: Charise Killian in Treatment: 0 Wound Status Wound Number: 3 Primary Diabetic Wound/Ulcer of the Lower Extremity Etiology: Wound Location: Right Calcaneus Wound Open Wounding Event: Blister Status: Date Acquired: 07/07/2023 Comorbid Cataracts, Anemia, Asthma, Chronic Obstructive Pulmonary Weeks Of Treatment: 0 History: Disease (COPD), Sleep Apnea, Congestive Heart Failure, Coronary Clustered Wound: No Artery Disease, Hypertension, Peripheral Venous Disease, Type II Diabetes, Osteoarthritis, Neuropathy, Confinement Anxiety Photos Wound Measurements Length: (cm) 5.6 Width: (cm) 5.2 Depth: (cm) 0.1 Area: (cm) 22.871 Volume: (cm) 2.287 % Reduction in Area: % Reduction in Volume: Epithelialization: Medium (34-66%) Tunneling: No Undermining: No Wound Description Classification: Grade 2 Wound Margin: Flat and Intact Exudate Amount: Medium Exudate Type: Serosanguineous Exudate Color: red, brown Foul Odor After Cleansing: Yes Due to Product Use: No Slough/Fibrino Yes Wound Bed Granulation Amount: Medium (34-66%) Exposed Structure Granulation Quality: Red Fascia Exposed: No Necrotic Amount: Medium (34-66%) Fat Layer (Subcutaneous Tissue) Exposed: Yes Necrotic Quality: Eschar, Adherent Slough Tendon Exposed: No Muscle Exposed: No Joint Exposed: No Bone Exposed: No Periwound Skin Texture Texture Color No Abnormalities Noted: Yes No Abnormalities Noted: No Erythema: Yes Moisture Erythema Measurement: Measured No Abnormalities Noted: No 4 cm Maceration: Yes Rubor: No Temperature / Pain [name], [name] ([id number]) [id number] Page 9 of 9 Temperature:  No Abnormality Tenderness on Palpation: Yes Treatment Notes Wound #3 (Calcaneus) Wound Laterality: Right Cleanser Wound Cleanser Discharge Instruction: Cleanse the wound with wound cleanser prior to applying a clean dressing using gauze sponges, not tissue or cotton balls. Peri-Wound Care Topical Primary Dressing Maxorb Extra Ag+ Alginate Dressing, 4x4.75 (in/in) Discharge Instruction: Apply to wound bed as instructed Secondary Dressing ALLEVYN Heel 4 1/2in x 5 1/2in / 10.5cm x 13.5cm Discharge Instruction: Apply over primary dressing as directed. Woven Gauze Sponge, Non-Sterile 4x4 in Discharge Instruction: Apply over primary dressing as directed. Secured With [employer], 4.5x3.1 (in/yd) Discharge Instruction: Secure with Kerlix as directed. Transpore Surgical Tape, 2x10 (in/yd) Discharge Instruction: Secure dressing with tape as directed.  Pain Level: 8 Least Pain Level: 0 Character of Pain Describe the Pain: Aching, Throbbing Pain Management and Medication Current Pain Management: Medication: Yes Is the Current Pain Management Adequate: Adequate How does your wound impact your activities of daily livingo Sleep: Yes Electronic Signature(s) Signed: 10/04/2023 2:40:12 PM By: Zenaida Deed RN, BSN Entered By: Zenaida Deed on 10/04/2023 06:47:32 -------------------------------------------------------------------------------- Patient/Caregiver Education Details Patient Name: Date of Service: [facility], [name]. 10/29/2024andnbsp9:00 A M Medical Record Number: [id number] Patient Account Number: [id number] Date of Birth/Gender: Treating RN: 1961-02-12 (62 y.o. [name] Primary Care Physician: Sharon Seller Other Clinician: Referring Physician: Treating Physician/Extender: Charise Killian in Treatment: 0 Education Assessment Education Provided To: Patient Education Topics Provided Elevated Blood Sugar/ Impact on Healing: Handouts: Elevated Blood Sugars: How Do They Affect Wound Healing Methods: Explain/Verbal, Printed Responses: Reinforcements needed, State content correctly Wound Debridement: Handouts: Wound Debridement Methods: Explain/Verbal, Printed Responses: Reinforcements needed, State content correctly Wound/Skin Impairment: Handouts: Caring for Your Ulcer Methods: Explain/Verbal, Printed Responses: Reinforcements needed, State content correctly Electronic Signature(s) [name], KREISS [name] ([id number]) [id number] Page 8 of 9 Signed: 10/04/2023 2:40:12 PM By: Zenaida Deed RN, BSN Entered By: Zenaida Deed on 10/04/2023 07:04:14 -------------------------------------------------------------------------------- Wound Assessment Details Patient Name: Date of Service: [name], Teresa YQ [name].  10/04/2023 9:00 A M Medical Record Number: [id number] Patient Account Number: [id number] Date of Birth/Sex: Treating RN: [birth year] (62 y.o. Billy Coast, Bonita Quin Primary Care Katiya Fike: Sharon Seller Other Clinician: Referring Davionne Mastrangelo: Treating Jaylyn Iyer/Extender: Charise Killian in Treatment: 0 Wound Status Wound Number: 3 Primary Diabetic Wound/Ulcer of the Lower Extremity Etiology: Wound Location: Right Calcaneus Wound Open Wounding Event: Blister Status: Date Acquired: 07/07/2023 Comorbid Cataracts, Anemia, Asthma, Chronic Obstructive Pulmonary Weeks Of Treatment: 0 History: Disease (COPD), Sleep Apnea, Congestive Heart Failure, Coronary Clustered Wound: No Artery Disease, Hypertension, Peripheral Venous Disease, Type II Diabetes, Osteoarthritis, Neuropathy, Confinement Anxiety Photos Wound Measurements Length: (cm) 5.6 Width: (cm) 5.2 Depth: (cm) 0.1 Area: (cm) 22.871 Volume: (cm) 2.287 % Reduction in Area: % Reduction in Volume: Epithelialization: Medium (34-66%) Tunneling: No Undermining: No Wound Description Classification: Grade 2 Wound Margin: Flat and Intact Exudate Amount: Medium Exudate Type: Serosanguineous Exudate Color: red, brown Foul Odor After Cleansing: Yes Due to Product Use: No Slough/Fibrino Yes Wound Bed Granulation Amount: Medium (34-66%) Exposed Structure Granulation Quality: Red Fascia Exposed: No Necrotic Amount: Medium (34-66%) Fat Layer (Subcutaneous Tissue) Exposed: Yes Necrotic Quality: Eschar, Adherent Slough Tendon Exposed: No Muscle Exposed: No Joint Exposed: No Bone Exposed: No Periwound Skin Texture Texture Color No Abnormalities Noted: Yes No Abnormalities Noted: No Erythema: Yes Moisture Erythema Measurement: Measured No Abnormalities Noted: No 4 cm Maceration: Yes Rubor: No Temperature / Pain [name], [name] ([id number]) [id number] Page 9 of 9 Temperature:  No Abnormality Tenderness on Palpation: Yes Treatment Notes Wound #3 (Calcaneus) Wound Laterality: Right Cleanser Wound Cleanser Discharge Instruction: Cleanse the wound with wound cleanser prior to applying a clean dressing using gauze sponges, not tissue or cotton balls. Peri-Wound Care Topical Primary Dressing Maxorb Extra Ag+ Alginate Dressing, 4x4.75 (in/in) Discharge Instruction: Apply to wound bed as instructed Secondary Dressing ALLEVYN Heel 4 1/2in x 5 1/2in / 10.5cm x 13.5cm Discharge Instruction: Apply over primary dressing as directed. Woven Gauze Sponge, Non-Sterile 4x4 in Discharge Instruction: Apply over primary dressing as directed. Secured With [employer], 4.5x3.1 (in/yd) Discharge Instruction: Secure with Kerlix as directed. Transpore Surgical Tape, 2x10 (in/yd) Discharge Instruction: Secure dressing with tape as directed.  0 Hypo/Hyperglycemic Management (do not check if billed separately) X- 1 15 Ankle / Brachial Index (ABI) - do not check if billed separately Has the patient been seen at the hospital within the last three years: Yes Total Score: 110 Level Of Care: New/Established - Level 3 [name], [name] ([id number]) [id number] Page 3 of 9 Electronic Signature(s) Signed: 10/04/2023 2:40:12 PM By: Zenaida Deed RN, BSN Entered By: Zenaida Deed on 10/04/2023 07:04:58 -------------------------------------------------------------------------------- Encounter Discharge Information Details Patient Name: Date of Service: [name], Teresa YQ [name]. 10/04/2023 9:00 A M Medical Record Number: [id number] Patient Account Number: [id number] Date of Birth/Sex: Treating RN: 12-03-1961 (62 y.o. [name] Primary Care Roel Douthat: Sharon Seller Other Clinician: Referring Delio Slates: Treating Rhilyn Battle/Extender: Charise Killian in Treatment: 0 Encounter Discharge Information Items Post Procedure Vitals Discharge Condition: Stable Temperature (F): 97.6 Ambulatory Status: Wheelchair Pulse (bpm): 61 Discharge Destination: Home Respiratory Rate (breaths/min): 18 Transportation: Private Auto Blood Pressure (mmHg): 133/59 Accompanied By: spouse Schedule Follow-up Appointment: Yes Clinical Summary of Care: Patient Declined Electronic Signature(s) Signed: 10/04/2023 2:40:12 PM By: Zenaida Deed RN, BSN Entered By: Zenaida Deed on 10/04/2023 11:39:47 -------------------------------------------------------------------------------- Lower Extremity Assessment Details Patient Name: Date of Service: [name] [place], Teresa GL [name]. 10/04/2023 9:00 A M Medical Record Number: [id number] Patient Account Number: [id number] Date of Birth/Sex: Treating RN: 1961-09-17 (62 y.o. [name] Primary Care Madilyne Tadlock: Sharon Seller Other Clinician: Referring Brittinee Risk: Treating Omesha Bowerman/Extender: Charise Killian in Treatment: 0 Edema Assessment Assessed: [Left: No] [Right: No] Edema: [Left: Ye] [Right: s] Calf Left: Right: Point of Measurement: From Medial Instep 41.8 cm Ankle Left: Right: Point of Measurement: From Medial Instep 21.7 cm Vascular Assessment Pulses: Dorsalis Pedis Palpable: [Right:Yes] Extremity colors, hair growth, and conditions: Extremity Color: [Right:Hyperpigmented] Hair Growth on Extremity: [Right:No] Temperature of Extremity: [Right:Warm] [name], [name] ([id number]) [Right:[id number] Page 4 of 9] Capillary Refill: [Right:< 3 seconds] Dependent Rubor: [Right:No] Blanched when Elevated: [Right:No] Lipodermatosclerosis: [Right:No] Blood Pressure: Brachial: [Right:133] Toe Nail Assessment Left: Right: Thick: Yes Discolored: Yes Deformed: Yes Improper Length and Hygiene: No Notes right DP and PT pulses noncompressible Electronic Signature(s) Signed: 10/04/2023 2:40:12 PM By: Zenaida Deed RN, BSN Entered By: Zenaida Deed on 10/04/2023 06:55:36 -------------------------------------------------------------------------------- Multi Wound Chart Details Patient Name: Date of Service: [facility], [name]. 10/04/2023 9:00 A M Medical Record Number: [id number] Patient Account Number: [id number] Date of Birth/Sex: Treating RN: 29-May-1961 (62 y.o. F) Primary Care Ezel Vallone: Sharon Seller Other Clinician: Referring  Izzak Fries: Treating Satin Boal/Extender: Charise Killian in Treatment: 0 Vital Signs Height(in): 61 Capillary Blood Glucose(mg/dl): 323 Weight(lbs): 557 Pulse(bpm): 61 Body Mass Index(BMI): 53.5 Blood Pressure(mmHg): 133/59 Temperature(F): 97.6 Respiratory Rate(breaths/min): 20 [3:Photos:] [N/A:N/A] Right Calcaneus N/A N/A Wound Location: Blister N/A N/A Wounding Event: Diabetic Wound/Ulcer of the Lower N/A N/A Primary Etiology: Extremity Cataracts, Anemia, Asthma, Chronic N/A N/A Comorbid History: Obstructive Pulmonary Disease (COPD), Sleep Apnea, Congestive Heart Failure, Coronary Artery Disease, Hypertension, Peripheral Venous Disease, Type II Diabetes, Osteoarthritis, Neuropathy, Confinement Anxiety 07/07/2023 N/A N/A Date Acquired: 0 N/A N/A Weeks of Treatment: Open N/A N/A Wound Status: No N/A N/A Wound Recurrence: 5.6x5.2x0.1 N/A N/A Measurements L x W x D (cm) 22.871 N/A N/A A (cm) : rea 2.287 N/A N/A Volume (cm) : Grade 2 N/A N/A ClassificationMASIYAH, [name] ([id number]) [id number] Page 5 of 9 Medium N/A N/A Exudate A mount: Serosanguineous N/A N/A Exudate Type: red, brown N/A N/A Exudate Color: Yes N/A N/A Foul  0 Hypo/Hyperglycemic Management (do not check if billed separately) X- 1 15 Ankle / Brachial Index (ABI) - do not check if billed separately Has the patient been seen at the hospital within the last three years: Yes Total Score: 110 Level Of Care: New/Established - Level 3 [name], [name] ([id number]) [id number] Page 3 of 9 Electronic Signature(s) Signed: 10/04/2023 2:40:12 PM By: Zenaida Deed RN, BSN Entered By: Zenaida Deed on 10/04/2023 07:04:58 -------------------------------------------------------------------------------- Encounter Discharge Information Details Patient Name: Date of Service: [name], Teresa YQ [name]. 10/04/2023 9:00 A M Medical Record Number: [id number] Patient Account Number: [id number] Date of Birth/Sex: Treating RN: 12-03-1961 (62 y.o. [name] Primary Care Roel Douthat: Sharon Seller Other Clinician: Referring Delio Slates: Treating Rhilyn Battle/Extender: Charise Killian in Treatment: 0 Encounter Discharge Information Items Post Procedure Vitals Discharge Condition: Stable Temperature (F): 97.6 Ambulatory Status: Wheelchair Pulse (bpm): 61 Discharge Destination: Home Respiratory Rate (breaths/min): 18 Transportation: Private Auto Blood Pressure (mmHg): 133/59 Accompanied By: spouse Schedule Follow-up Appointment: Yes Clinical Summary of Care: Patient Declined Electronic Signature(s) Signed: 10/04/2023 2:40:12 PM By: Zenaida Deed RN, BSN Entered By: Zenaida Deed on 10/04/2023 11:39:47 -------------------------------------------------------------------------------- Lower Extremity Assessment Details Patient Name: Date of Service: [name] [place], Teresa GL [name]. 10/04/2023 9:00 A M Medical Record Number: [id number] Patient Account Number: [id number] Date of Birth/Sex: Treating RN: 1961-09-17 (62 y.o. [name] Primary Care Madilyne Tadlock: Sharon Seller Other Clinician: Referring Brittinee Risk: Treating Omesha Bowerman/Extender: Charise Killian in Treatment: 0 Edema Assessment Assessed: [Left: No] [Right: No] Edema: [Left: Ye] [Right: s] Calf Left: Right: Point of Measurement: From Medial Instep 41.8 cm Ankle Left: Right: Point of Measurement: From Medial Instep 21.7 cm Vascular Assessment Pulses: Dorsalis Pedis Palpable: [Right:Yes] Extremity colors, hair growth, and conditions: Extremity Color: [Right:Hyperpigmented] Hair Growth on Extremity: [Right:No] Temperature of Extremity: [Right:Warm] [name], [name] ([id number]) [Right:[id number] Page 4 of 9] Capillary Refill: [Right:< 3 seconds] Dependent Rubor: [Right:No] Blanched when Elevated: [Right:No] Lipodermatosclerosis: [Right:No] Blood Pressure: Brachial: [Right:133] Toe Nail Assessment Left: Right: Thick: Yes Discolored: Yes Deformed: Yes Improper Length and Hygiene: No Notes right DP and PT pulses noncompressible Electronic Signature(s) Signed: 10/04/2023 2:40:12 PM By: Zenaida Deed RN, BSN Entered By: Zenaida Deed on 10/04/2023 06:55:36 -------------------------------------------------------------------------------- Multi Wound Chart Details Patient Name: Date of Service: [facility], [name]. 10/04/2023 9:00 A M Medical Record Number: [id number] Patient Account Number: [id number] Date of Birth/Sex: Treating RN: 29-May-1961 (62 y.o. F) Primary Care Ezel Vallone: Sharon Seller Other Clinician: Referring  Izzak Fries: Treating Satin Boal/Extender: Charise Killian in Treatment: 0 Vital Signs Height(in): 61 Capillary Blood Glucose(mg/dl): 323 Weight(lbs): 557 Pulse(bpm): 61 Body Mass Index(BMI): 53.5 Blood Pressure(mmHg): 133/59 Temperature(F): 97.6 Respiratory Rate(breaths/min): 20 [3:Photos:] [N/A:N/A] Right Calcaneus N/A N/A Wound Location: Blister N/A N/A Wounding Event: Diabetic Wound/Ulcer of the Lower N/A N/A Primary Etiology: Extremity Cataracts, Anemia, Asthma, Chronic N/A N/A Comorbid History: Obstructive Pulmonary Disease (COPD), Sleep Apnea, Congestive Heart Failure, Coronary Artery Disease, Hypertension, Peripheral Venous Disease, Type II Diabetes, Osteoarthritis, Neuropathy, Confinement Anxiety 07/07/2023 N/A N/A Date Acquired: 0 N/A N/A Weeks of Treatment: Open N/A N/A Wound Status: No N/A N/A Wound Recurrence: 5.6x5.2x0.1 N/A N/A Measurements L x W x D (cm) 22.871 N/A N/A A (cm) : rea 2.287 N/A N/A Volume (cm) : Grade 2 N/A N/A ClassificationMASIYAH, [name] ([id number]) [id number] Page 5 of 9 Medium N/A N/A Exudate A mount: Serosanguineous N/A N/A Exudate Type: red, brown N/A N/A Exudate Color: Yes N/A N/A Foul

## 2023-10-04 NOTE — Progress Notes (Signed)
KEIANDRA, HULTON B ([id number]) [id number] Page 1 of 4 Visit Report for 10/04/2023 Abuse Risk Screen Details Patient Name: Date of Service: [name], [place] VO B. 10/04/2023 9:00 A M Medical Record Number: [id number] Patient Account Number: [id number] Date of Birth/Sex: Treating RN: 05-Feb-1961 (62 y.o. [name] Ariel Dimitri: Sharon Seller Other Clinician: Referring Indria Bishara: Treating Miana Politte/Extender: Charise Killian in Treatment: 0 Abuse Risk Screen Items Answer ABUSE RISK SCREEN: Has anyone close to you tried to hurt or harm you recentlyo No Do you feel uncomfortable with anyone in your familyo No Has anyone forced you do things that you didnt want to doo No Electronic Signature(s) Signed: 10/04/2023 2:40:12 PM By: Zenaida Deed RN, BSN Entered By: Zenaida Deed on 10/04/2023 06:27:48 -------------------------------------------------------------------------------- Activities of Daily Living Details Patient Name: Date of Service: [facility] [name], [place] VQ B. 10/04/2023 9:00 A M Medical Record Number: [id number] Patient Account Number: [id number] Date of Birth/Sex: Treating RN: 02-12-1961 (62 y.o. [name] Chris Cripps: Sharon Seller Other Clinician: Referring Esli Jernigan: Treating Faythe Heitzenrater/Extender: Charise Killian in Treatment: 0 Activities of Daily Living Items Answer Activities of Daily Living (Please select one for each item) Drive Automobile Not Able T Medications ake Need Assistance Use T elephone Completely Able [name] for Appearance Need Assistance Use T oilet Completely Able Bath / Shower Need Assistance Dress Self Need Assistance Feed Self Completely Able Walk Need Assistance Get In / Out Bed Need Assistance Housework Not Able Prepare Meals Not Able Handle Money Need Assistance Shop for Self Need Assistance Electronic  Signature(s) Signed: 10/04/2023 2:40:12 PM By: Zenaida Deed RN, BSN Entered By: Zenaida Deed on 10/04/2023 06:28:30 [name] ([id number]) [id number] Page 2 of 4 -------------------------------------------------------------------------------- Education Screening Details Patient Name: Date of Service: [name], [place] GB B. 10/04/2023 9:00 A M Medical Record Number: [id number] Patient Account Number: [id number] Date of Birth/Sex: Treating RN: [birth year] (62 y.o. [name] Rasheen Schewe: Sharon Seller Other Clinician: Referring Heaven Meeker: Treating Yalonda Sample/Extender: Charise Killian in Treatment: 0 Primary Learner Assessed: Patient Learning Preferences/Education Level/Primary Language Learning Preference: Explanation, Demonstration, Video, Printed Material Highest Education Level: College or Above Preferred Language: English Cognitive Barrier Language Barrier: No Translator Needed: No Memory Deficit: No Emotional Barrier: No Cultural/Religious Beliefs Affecting Medical [name]: No Physical Barrier Impaired Vision: Yes Glasses Impaired Hearing: No Decreased Hand dexterity: No Knowledge/Comprehension Knowledge Level: High Comprehension Level: High Ability to understand written instructions: High Ability to understand verbal instructions: High Motivation Anxiety Level: Calm Cooperation: Cooperative Education Importance: Acknowledges Need Interest in Health Problems: Asks Questions Perception: Coherent Willingness to Engage in Self-Management High Activities: Readiness to Engage in Self-Management High Activities: Electronic Signature(s) Signed: 10/04/2023 2:40:12 PM By: Zenaida Deed RN, BSN Entered By: Zenaida Deed on 10/04/2023 06:28:37 -------------------------------------------------------------------------------- Fall Risk Assessment Details Patient Name: Date of  Service: [facility], [place] PX B. 10/04/2023 9:00 A M Medical Record Number: [id number] Patient Account Number: [id number] Date of Birth/Sex: Treating RN: [birth year] (62 y.o. [name] Shamikia Linskey: Sharon Seller Other Clinician: Referring Nora Rooke: Treating Tyger Wichman/Extender: Charise Killian in Treatment: 0 Fall Risk Assessment Items Have you had 2 or more falls in the last 12 monthso 0 No MARIONETTE, MISA B ([id number]) [phone] [id number] Page 3 of 4 Have you had any fall that resulted in injury in the last 12 monthso 0 No FALLS RISK SCREEN History of falling - immediate or within  3 months 0 No Secondary diagnosis (Do you have 2 or more medical diagnoseso) 0 No Ambulatory aid None/bed rest/wheelchair/nurse 0 Yes Crutches/cane/walker 0 No Furniture 0 No Intravenous therapy Access/Saline/Heparin Lock 0 No Gait/Transferring Normal/ bed rest/ wheelchair 0 No Weak (short steps with or without shuffle, stooped but able to lift head while walking, may seek 0 No support from furniture) Impaired (short steps with shuffle, may have difficulty arising from chair, head down, impaired 20 Yes balance) Mental Status Oriented to own ability 0 Yes Electronic Signature(s) Signed: 10/04/2023 2:40:12 PM By: Zenaida Deed RN, BSN Entered By: Zenaida Deed on 10/04/2023 91:47:82 -------------------------------------------------------------------------------- Foot Assessment Details Patient Name: Date of Service: [name], [place] [name] 10/04/2023 9:00 A M Medical Record Number: [id number] Patient Account Number: [id number] Date of Birth/Sex: Treating RN: 05-29-1961 (62 y.o. [name] Zemirah Krasinski: Sharon Seller Other Clinician: Referring Roma Bierlein: Treating Henderson Frampton/Extender: Charise Killian in Treatment: 0 Foot Assessment Items Site Locations + = Sensation  present, - = Sensation absent, C = Callus, U = Ulcer R = Redness, W = Warmth, M = Maceration, PU = Pre-ulcerative lesion F = Fissure, S = Swelling, D = Dryness Assessment Right: Left: Other Deformity: No No Prior Foot Ulcer: No No Prior Amputation: No No Charcot Joint: No No Ambulatory Status: Non-ambulatory Assistance Device: Wheelchair JENNFER, SONIA B ([id number]) [id number] Page 4 of 4 Gait: Electronic Signature(s) Signed: 10/04/2023 2:40:12 PM By: Zenaida Deed RN, BSN Entered By: Zenaida Deed on 10/04/2023 06:39:15 -------------------------------------------------------------------------------- Nutrition Risk Screening Details Patient Name: Date of Service: [facility] [place], [place] BM B. 10/04/2023 9:00 A M Medical Record Number: [id number] Patient Account Number: [id number] Date of Birth/Sex: Treating RN: Jul 10, 1961 (62 y.o. [name] Samuel Mcpeek: Sharon Seller Other Clinician: Referring Crysten Kaman: Treating Serjio Deupree/Extender: Charise Killian in Treatment: 0 Height (in): 61 Weight (lbs): 283 Body Mass Index (BMI): 53.5 Nutrition Risk Screening Items Score Screening NUTRITION RISK SCREEN: I have an illness or condition that made me change the kind and/or amount of food I eat 0 No I eat fewer than two meals per day 0 No I eat few fruits and vegetables, or milk products 0 No I have three or more drinks of beer, liquor or wine almost every day 0 No I have tooth or mouth problems that make it hard for me to eat 0 No I don't always have enough money to buy the food I need 0 No I eat alone most of the time 1 Yes I take three or more different prescribed or over-the-counter drugs a day 1 Yes Without wanting to, I have lost or gained 10 pounds in the last six months 2 Yes I am not always physically able to shop, cook and/or feed myself 0 No Nutrition Protocols Good Risk Protocol Provide  education on elevated blood Moderate Risk Protocol 0 sugars and impact on wound healing, as applicable High Risk Proctocol Risk Level: Moderate Risk Score: 4 Electronic Signature(s) Signed: 10/04/2023 2:40:12 PM By: Zenaida Deed RN, BSN Entered By: Zenaida Deed on 10/04/2023 06:29:37

## 2023-10-05 NOTE — Progress Notes (Signed)
Entered By: Duanne Guess on 10/04/2023 07:35:37 -------------------------------------------------------------------------------- HxROS Details Patient Name: Date of Service: [facility] [name],  [place] EX B. 10/04/2023 9:00 A M Medical Record Number: [id number] Patient Account Number: [id number] Date of Birth/Sex: Treating RN: 05/15/1961 (62 y.o. [name] Primary Care Provider: Sharon Seller Other Clinician: Referring Provider: Treating Provider/Extender: Charise Killian in Treatment: 0 Information Obtained From Patient Chart Eyes Complaints and Symptoms: Positive for: Glasses / Contacts Medical History: Positive for: Cataracts - extractions Negative for: Glaucoma; Optic Neuritis Ear/Nose/Mouth/Throat Complaints and Symptoms: Negative for: Chronic sinus problems or rhinitis Medical History: Negative for: Chronic sinus problems/congestion; Middle ear problems Respiratory Complaints and Symptoms: Positive for: Shortness of Breath Negative for: Chronic or frequent coughs Medical History: Positive for: Asthma; Chronic Obstructive Pulmonary Disease (COPD); Sleep Apnea Negative for: Aspiration; Pneumothorax; Tuberculosis Past Medical History Notes: O2 dependent Cardiovascular Complaints and Symptoms: Negative for: Chest pain Medical HistorySAMETRIA, PETRASEK ([id number]) [phone].pdf Page 9 of 11 Positive for: Congestive Heart Failure; Coronary Artery Disease; Hypertension; Peripheral Venous Disease Negative for: Angina; Arrhythmia; Deep Vein Thrombosis; Hypotension; Myocardial Infarction; Peripheral Arterial Disease; Phlebitis Past Medical History Notes: hyperlipidemia, cardiomegaly Endocrine Complaints and Symptoms: Negative for: Heat/cold intolerance Medical History: Positive for: Type II Diabetes Negative for: Type I Diabetes Past Medical History Notes: hypothyroidism Time with diabetes: 47 Treated with: Insulin Blood sugar tested every day: Yes Tested : checked at facility Genitourinary Complaints and Symptoms: Positive for: Frequent urination Medical History: Negative for: End Stage Renal  Disease Past Medical History Notes: overactive bladder, uterine prolapse, right renal cyst Integumentary (Skin) Complaints and Symptoms: Positive for: Wounds - right heel Medical History: Negative for: History of Burn Musculoskeletal Complaints and Symptoms: Positive for: Muscle Weakness Negative for: Muscle Pain Medical History: Positive for: Osteoarthritis Negative for: Gout; Rheumatoid Arthritis; Osteomyelitis Neurologic Complaints and Symptoms: Positive for: Numbness/parasthesias Medical History: Positive for: Neuropathy Negative for: Dementia; Quadriplegia; Paraplegia; Seizure Disorder Psychiatric Complaints and Symptoms: Positive for: Claustrophobia Medical History: Positive for: Confinement Anxiety Negative for: Anorexia/bulimia Past Medical History Notes: depression Constitutional Symptoms (General Health) Medical History: Past Medical History Notes: morbid obesity Hematologic/Lymphatic Medical History: Positive for: Anemia Negative for: Hemophilia; Human Immunodeficiency Virus; Lymphedema; Sickle Cell Disease Gastrointestinal Medical History: Negative for: Cirrhosis ; Colitis; Crohns; Hepatitis A; Hepatitis B; Hepatitis SERA, TERRANOVA B ([id number]) [id number] Page 10 of 11 Past Medical History Notes: GERD, IBS Immunological Medical History: Negative for: Lupus Erythematosus; Raynauds; Scleroderma Oncologic Medical History: Negative for: Received Chemotherapy; Received Radiation Past Medical History Notes: sigmoid colon HBO Extended History Items Eyes: Cataracts Immunizations Pneumococcal Vaccine: Received Pneumococcal Vaccination: Yes Received Pneumococcal Vaccination On or After 60th Birthday: No Implantable Devices None Hospitalization / Surgery History Type of Hospitalization/Surgery polypectomy 04/2022 cataract extraction c-section cholecystectomy sigmoid colon resection Family and Social  History Cancer: Yes - Maternal Grandparents; Diabetes: Yes - Father,Siblings,Maternal Grandparents,Paternal Grandparents; Heart Disease: Yes - Father,Siblings,Child,Maternal Grandparents; Hereditary Spherocytosis: No; Hypertension: Yes - Maternal Grandparents,Paternal Grandparents,Mother,Father,Siblings,Child; Kidney Disease: Yes - Father,Mother; Lung Disease: No; Seizures: No; Stroke: Yes - Mother,Siblings; Thyroid Problems: Yes - Father,Siblings; Tuberculosis: No; Never smoker; Marital Status - Married; Alcohol Use: Never; Drug Use: No History; Caffeine Use: Moderate; Financial Concerns: No; Food, Clothing or Shelter Needs: No; Support System Lacking: No; Transportation Concerns: No Psychologist, prison and probation services) Signed: 10/04/2023 10:45:17 AM By: Duanne Guess MD FACS Signed: 10/04/2023 2:40:12 PM By: Zenaida Deed RN, BSN Entered By: Zenaida Deed on 10/04/2023 06:27:40 -------------------------------------------------------------------------------- SuperBill Details Patient Name: Date of Service: [facility] [place], [place]  Entered By: Duanne Guess on 10/04/2023 07:35:37 -------------------------------------------------------------------------------- HxROS Details Patient Name: Date of Service: [facility] [name],  [place] EX B. 10/04/2023 9:00 A M Medical Record Number: [id number] Patient Account Number: [id number] Date of Birth/Sex: Treating RN: 05/15/1961 (62 y.o. [name] Primary Care Provider: Sharon Seller Other Clinician: Referring Provider: Treating Provider/Extender: Charise Killian in Treatment: 0 Information Obtained From Patient Chart Eyes Complaints and Symptoms: Positive for: Glasses / Contacts Medical History: Positive for: Cataracts - extractions Negative for: Glaucoma; Optic Neuritis Ear/Nose/Mouth/Throat Complaints and Symptoms: Negative for: Chronic sinus problems or rhinitis Medical History: Negative for: Chronic sinus problems/congestion; Middle ear problems Respiratory Complaints and Symptoms: Positive for: Shortness of Breath Negative for: Chronic or frequent coughs Medical History: Positive for: Asthma; Chronic Obstructive Pulmonary Disease (COPD); Sleep Apnea Negative for: Aspiration; Pneumothorax; Tuberculosis Past Medical History Notes: O2 dependent Cardiovascular Complaints and Symptoms: Negative for: Chest pain Medical HistorySAMETRIA, PETRASEK ([id number]) [phone].pdf Page 9 of 11 Positive for: Congestive Heart Failure; Coronary Artery Disease; Hypertension; Peripheral Venous Disease Negative for: Angina; Arrhythmia; Deep Vein Thrombosis; Hypotension; Myocardial Infarction; Peripheral Arterial Disease; Phlebitis Past Medical History Notes: hyperlipidemia, cardiomegaly Endocrine Complaints and Symptoms: Negative for: Heat/cold intolerance Medical History: Positive for: Type II Diabetes Negative for: Type I Diabetes Past Medical History Notes: hypothyroidism Time with diabetes: 47 Treated with: Insulin Blood sugar tested every day: Yes Tested : checked at facility Genitourinary Complaints and Symptoms: Positive for: Frequent urination Medical History: Negative for: End Stage Renal  Disease Past Medical History Notes: overactive bladder, uterine prolapse, right renal cyst Integumentary (Skin) Complaints and Symptoms: Positive for: Wounds - right heel Medical History: Negative for: History of Burn Musculoskeletal Complaints and Symptoms: Positive for: Muscle Weakness Negative for: Muscle Pain Medical History: Positive for: Osteoarthritis Negative for: Gout; Rheumatoid Arthritis; Osteomyelitis Neurologic Complaints and Symptoms: Positive for: Numbness/parasthesias Medical History: Positive for: Neuropathy Negative for: Dementia; Quadriplegia; Paraplegia; Seizure Disorder Psychiatric Complaints and Symptoms: Positive for: Claustrophobia Medical History: Positive for: Confinement Anxiety Negative for: Anorexia/bulimia Past Medical History Notes: depression Constitutional Symptoms (General Health) Medical History: Past Medical History Notes: morbid obesity Hematologic/Lymphatic Medical History: Positive for: Anemia Negative for: Hemophilia; Human Immunodeficiency Virus; Lymphedema; Sickle Cell Disease Gastrointestinal Medical History: Negative for: Cirrhosis ; Colitis; Crohns; Hepatitis A; Hepatitis B; Hepatitis SERA, TERRANOVA B ([id number]) [id number] Page 10 of 11 Past Medical History Notes: GERD, IBS Immunological Medical History: Negative for: Lupus Erythematosus; Raynauds; Scleroderma Oncologic Medical History: Negative for: Received Chemotherapy; Received Radiation Past Medical History Notes: sigmoid colon HBO Extended History Items Eyes: Cataracts Immunizations Pneumococcal Vaccine: Received Pneumococcal Vaccination: Yes Received Pneumococcal Vaccination On or After 60th Birthday: No Implantable Devices None Hospitalization / Surgery History Type of Hospitalization/Surgery polypectomy 04/2022 cataract extraction c-section cholecystectomy sigmoid colon resection Family and Social  History Cancer: Yes - Maternal Grandparents; Diabetes: Yes - Father,Siblings,Maternal Grandparents,Paternal Grandparents; Heart Disease: Yes - Father,Siblings,Child,Maternal Grandparents; Hereditary Spherocytosis: No; Hypertension: Yes - Maternal Grandparents,Paternal Grandparents,Mother,Father,Siblings,Child; Kidney Disease: Yes - Father,Mother; Lung Disease: No; Seizures: No; Stroke: Yes - Mother,Siblings; Thyroid Problems: Yes - Father,Siblings; Tuberculosis: No; Never smoker; Marital Status - Married; Alcohol Use: Never; Drug Use: No History; Caffeine Use: Moderate; Financial Concerns: No; Food, Clothing or Shelter Needs: No; Support System Lacking: No; Transportation Concerns: No Psychologist, prison and probation services) Signed: 10/04/2023 10:45:17 AM By: Duanne Guess MD FACS Signed: 10/04/2023 2:40:12 PM By: Zenaida Deed RN, BSN Entered By: Zenaida Deed on 10/04/2023 06:27:40 -------------------------------------------------------------------------------- SuperBill Details Patient Name: Date of Service: [facility] [place], [place]  MISTIQUE, AUTEN B ([id number]) [id number] Page 1 of 11 Visit Report for 10/04/2023 Chief Complaint Document Details Patient Name: Date of Service: [facility] [place], [place] B. 10/04/2023 9:00 A M Medical Record Number: [id number] Patient Account Number: [id number] Date of Birth/Sex: Treating RN: 23-Jun-1961 (62 y.o. 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 10/04/2023 9:00 A M Medical Record Number: [id number] Patient Account Number: [id number] Date of Birth/Sex: Treating RN: January 20, 1961 (62 y.o. [name] Primary Care Provider: Sharon Seller Other Clinician: Referring Provider: Treating Provider/Extender: Charise Killian in Treatment: 0 Debridement Performed for Assessment: Wound #3 Right Calcaneus Performed By: Physician Duanne Guess, MD The following information was scribed by: Zenaida Deed The information was scribed for: Duanne Guess Debridement Type: Debridement Severity of Tissue Pre Debridement: Fat layer exposed Level of Consciousness (Pre-procedure): Awake and Alert Pre-procedure Verification/Time Out Yes - 10:05 Taken: Start Time: 10:07 Pain Control: Lidocaine 4% T opical  Solution Percent of Wound Bed Debrided: 150% T Area Debrided (cm): otal 34.29 Tissue and other material debrided: Viable, Non-Viable, Slough, Subcutaneous, Skin: Epidermis, Slough Level: Skin/Subcutaneous Tissue Debridement Description: Excisional Instrument: Curette, Forceps, Scissors Specimen: Tissue Culture Number of Specimens T aken: 1 Bleeding: Minimum Hemostasis Achieved: Pressure Procedural Pain: 0 Post Procedural Pain: 0 Response to Treatment: Procedure was tolerated well Level of Consciousness (Post- Awake and Alert procedure): Post Debridement Measurements of Total Wound NAZYIAH, WELLMAN B ([id number]) [id number] Page 2 of 11 Length: (cm) 5.6 Width: (cm) 5.2 Depth: (cm) 0.1 Volume: (cm) 2.287 Character of Wound/Ulcer Post Debridement: Improved Severity of Tissue Post Debridement: Fat layer exposed Post Procedure Diagnosis Same as Pre-procedure Electronic Signature(s) Signed: 10/04/2023 10:45:17 AM By: Duanne Guess MD FACS Signed: 10/04/2023 2:40:12 PM By: Zenaida Deed RN, BSN Entered By: Zenaida Deed on 10/04/2023 07:14:27 -------------------------------------------------------------------------------- HPI Details Patient Name: Date of Service: [name], [place] [name] 10/04/2023 9:00 A M Medical Record Number: [id number] Patient Account Number: [id number] Date of Birth/Sex: Treating RN: 03/27/1961 (62 y.o. F) Primary Care Provider: Sharon Seller Other Clinician: Referring Provider: Treating Provider/Extender: Charise Killian in Treatment: 0 History of Present Illness HPI Description: ADMISSION 10/09/2020 This is a 62 year old woman who is a type I diabetic with a last hemoglobin A1c of 10. She is referred from her primary doctor after developing a refractory rash in the folds of her lower abdominal pannus. There is no open wound here however extensive intertrigo with classic satellite  lesions highly suggestive of candidal infection. This is reasonably extensive on the lower abdominal pannus the superior part of her pubic symphysis all the way into the lower abdomen. She has been using nystatin powder and she did receive a course of Diflucan apparently for 10 days from her primary physician although I did not see this anywhere. I think things are somewhat better. Additionally the patient also brought up the fact that she has a small dime sized area on her left posterior calf  Entered By: Duanne Guess on 10/04/2023 07:35:37 -------------------------------------------------------------------------------- HxROS Details Patient Name: Date of Service: [facility] [name],  [place] EX B. 10/04/2023 9:00 A M Medical Record Number: [id number] Patient Account Number: [id number] Date of Birth/Sex: Treating RN: 05/15/1961 (62 y.o. [name] Primary Care Provider: Sharon Seller Other Clinician: Referring Provider: Treating Provider/Extender: Charise Killian in Treatment: 0 Information Obtained From Patient Chart Eyes Complaints and Symptoms: Positive for: Glasses / Contacts Medical History: Positive for: Cataracts - extractions Negative for: Glaucoma; Optic Neuritis Ear/Nose/Mouth/Throat Complaints and Symptoms: Negative for: Chronic sinus problems or rhinitis Medical History: Negative for: Chronic sinus problems/congestion; Middle ear problems Respiratory Complaints and Symptoms: Positive for: Shortness of Breath Negative for: Chronic or frequent coughs Medical History: Positive for: Asthma; Chronic Obstructive Pulmonary Disease (COPD); Sleep Apnea Negative for: Aspiration; Pneumothorax; Tuberculosis Past Medical History Notes: O2 dependent Cardiovascular Complaints and Symptoms: Negative for: Chest pain Medical HistorySAMETRIA, PETRASEK ([id number]) [phone].pdf Page 9 of 11 Positive for: Congestive Heart Failure; Coronary Artery Disease; Hypertension; Peripheral Venous Disease Negative for: Angina; Arrhythmia; Deep Vein Thrombosis; Hypotension; Myocardial Infarction; Peripheral Arterial Disease; Phlebitis Past Medical History Notes: hyperlipidemia, cardiomegaly Endocrine Complaints and Symptoms: Negative for: Heat/cold intolerance Medical History: Positive for: Type II Diabetes Negative for: Type I Diabetes Past Medical History Notes: hypothyroidism Time with diabetes: 47 Treated with: Insulin Blood sugar tested every day: Yes Tested : checked at facility Genitourinary Complaints and Symptoms: Positive for: Frequent urination Medical History: Negative for: End Stage Renal  Disease Past Medical History Notes: overactive bladder, uterine prolapse, right renal cyst Integumentary (Skin) Complaints and Symptoms: Positive for: Wounds - right heel Medical History: Negative for: History of Burn Musculoskeletal Complaints and Symptoms: Positive for: Muscle Weakness Negative for: Muscle Pain Medical History: Positive for: Osteoarthritis Negative for: Gout; Rheumatoid Arthritis; Osteomyelitis Neurologic Complaints and Symptoms: Positive for: Numbness/parasthesias Medical History: Positive for: Neuropathy Negative for: Dementia; Quadriplegia; Paraplegia; Seizure Disorder Psychiatric Complaints and Symptoms: Positive for: Claustrophobia Medical History: Positive for: Confinement Anxiety Negative for: Anorexia/bulimia Past Medical History Notes: depression Constitutional Symptoms (General Health) Medical History: Past Medical History Notes: morbid obesity Hematologic/Lymphatic Medical History: Positive for: Anemia Negative for: Hemophilia; Human Immunodeficiency Virus; Lymphedema; Sickle Cell Disease Gastrointestinal Medical History: Negative for: Cirrhosis ; Colitis; Crohns; Hepatitis A; Hepatitis B; Hepatitis SERA, TERRANOVA B ([id number]) [id number] Page 10 of 11 Past Medical History Notes: GERD, IBS Immunological Medical History: Negative for: Lupus Erythematosus; Raynauds; Scleroderma Oncologic Medical History: Negative for: Received Chemotherapy; Received Radiation Past Medical History Notes: sigmoid colon HBO Extended History Items Eyes: Cataracts Immunizations Pneumococcal Vaccine: Received Pneumococcal Vaccination: Yes Received Pneumococcal Vaccination On or After 60th Birthday: No Implantable Devices None Hospitalization / Surgery History Type of Hospitalization/Surgery polypectomy 04/2022 cataract extraction c-section cholecystectomy sigmoid colon resection Family and Social  History Cancer: Yes - Maternal Grandparents; Diabetes: Yes - Father,Siblings,Maternal Grandparents,Paternal Grandparents; Heart Disease: Yes - Father,Siblings,Child,Maternal Grandparents; Hereditary Spherocytosis: No; Hypertension: Yes - Maternal Grandparents,Paternal Grandparents,Mother,Father,Siblings,Child; Kidney Disease: Yes - Father,Mother; Lung Disease: No; Seizures: No; Stroke: Yes - Mother,Siblings; Thyroid Problems: Yes - Father,Siblings; Tuberculosis: No; Never smoker; Marital Status - Married; Alcohol Use: Never; Drug Use: No History; Caffeine Use: Moderate; Financial Concerns: No; Food, Clothing or Shelter Needs: No; Support System Lacking: No; Transportation Concerns: No Psychologist, prison and probation services) Signed: 10/04/2023 10:45:17 AM By: Duanne Guess MD FACS Signed: 10/04/2023 2:40:12 PM By: Zenaida Deed RN, BSN Entered By: Zenaida Deed on 10/04/2023 06:27:40 -------------------------------------------------------------------------------- SuperBill Details Patient Name: Date of Service: [facility]  and asked [place] to look at this. This is been there for about a month. She does not wear compression stockings. Last sleep she pointed out a rash on her left buttock and left lower back she has been scratching at this. There is no open wound here per se. 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 She has chronic venous insufficiency and lymphedema here she will need stockings. The intertrigo in her lower abdominal pannus still is not much better. I gave her additional 2 weeks of Diflucan and topical nystatin. Perhaps minimally better. She is going to need to be more aggressive about separating the folds and keeping this clean and dry. She has a much better looking area under the left breast READMISSION 10/04/2023 ***ABI non-compressible*** This is a now 62 year old super morbidly obese type II diabetic (last hemoglobin A1c 8.5% in July 2024). She has COPD and CHF and is largely nonambulatory. She resides at [facility]. She presents today with a large ulcer on her right heel. It appears to be pressure-induced in origin. Lindaann Pascal has been treating it with Santyl with saline moistened gauze  and calcium alginate. Due to failure of the wound to improve, she has been referred to the wound care center for further evaluation and management. Electronic Signature(s) Signed: 10/04/2023 10:30:04 AM By: Duanne Guess MD FACS Entered By: Duanne Guess on 10/04/2023 07:30:03 Physical Exam Details -------------------------------------------------------------------------------- [name] ([id number]) [id number] Page 3 of 11 Patient Name: Date of Service: [name], [place] B. 10/04/2023 9:00 A M Medical Record Number: [id number] Patient Account Number: [id number] Date of Birth/Sex: Treating RN: 1961-02-28 (62 y.o. F) Primary Care Provider: Sharon Seller Other Clinician: Referring Provider: Treating Provider/Extender: Charise Killian in Treatment: 0 Constitutional . . . . No acute distress. Respiratory Normal work of breathing on supplemental oxygen. Notes 10/04/2023: The entire right calcaneus is involved with an ulcer. There is thickened dry skin hanging from the perimeter of the wound. There are some areas that have epithelialized and others that exposed the fat layer. There is slough on most of the wound surfaces. There is an odor coming from the wound. Electronic Signature(s) Signed: 10/04/2023 10:32:57 AM By: Duanne Guess MD FACS Entered By: Duanne Guess on 10/04/2023 07:32:57 -------------------------------------------------------------------------------- Physician Orders Details Patient Name: Date of Service: [facility], [place] [name] 10/04/2023 9:00 A M Medical Record Number: [id number] Patient Account Number: [id number] Date of Birth/Sex: Treating RN: 15-Jun-1961 (62 y.o. Billy [name], Teresa Primary Care Provider: Sharon Seller Other Clinician: Referring Provider: Treating Provider/Extender: Charise Killian in Treatment: 0 The following information was  scribed by: Zenaida Deed The information was scribed for: Duanne Guess Verbal / Phone Orders: No Diagnosis Coding ICD-10 Coding Code Description E66.01 Morbid (severe) obesity due to excess calories J44.9 Chronic obstructive pulmonary disease, unspecified I50.32 Chronic diastolic (congestive) heart failure E11.65 Type 2 diabetes mellitus with hyperglycemia Follow-up Appointments ppointment in 1 week. - Dr. Lady Gary RM 2 Return A Wed 11/6 @ 1:30 pm Anesthetic (In clinic) Topical Lidocaine 4% applied to wound bed Bathing/ Shower/ Hygiene May shower with protection but do not get wound dressing(s) wet. Protect dressing(s) with water repellant cover (for example, large plastic bag) or  and asked [place] to look at this. This is been there for about a month. She does not wear compression stockings. Last sleep she pointed out a rash on her left buttock and left lower back she has been scratching at this. There is no open wound here per se. 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 She has chronic venous insufficiency and lymphedema here she will need stockings. The intertrigo in her lower abdominal pannus still is not much better. I gave her additional 2 weeks of Diflucan and topical nystatin. Perhaps minimally better. She is going to need to be more aggressive about separating the folds and keeping this clean and dry. She has a much better looking area under the left breast READMISSION 10/04/2023 ***ABI non-compressible*** This is a now 62 year old super morbidly obese type II diabetic (last hemoglobin A1c 8.5% in July 2024). She has COPD and CHF and is largely nonambulatory. She resides at [facility]. She presents today with a large ulcer on her right heel. It appears to be pressure-induced in origin. Lindaann Pascal has been treating it with Santyl with saline moistened gauze  and calcium alginate. Due to failure of the wound to improve, she has been referred to the wound care center for further evaluation and management. Electronic Signature(s) Signed: 10/04/2023 10:30:04 AM By: Duanne Guess MD FACS Entered By: Duanne Guess on 10/04/2023 07:30:03 Physical Exam Details -------------------------------------------------------------------------------- [name] ([id number]) [id number] Page 3 of 11 Patient Name: Date of Service: [name], [place] B. 10/04/2023 9:00 A M Medical Record Number: [id number] Patient Account Number: [id number] Date of Birth/Sex: Treating RN: 1961-02-28 (62 y.o. F) Primary Care Provider: Sharon Seller Other Clinician: Referring Provider: Treating Provider/Extender: Charise Killian in Treatment: 0 Constitutional . . . . No acute distress. Respiratory Normal work of breathing on supplemental oxygen. Notes 10/04/2023: The entire right calcaneus is involved with an ulcer. There is thickened dry skin hanging from the perimeter of the wound. There are some areas that have epithelialized and others that exposed the fat layer. There is slough on most of the wound surfaces. There is an odor coming from the wound. Electronic Signature(s) Signed: 10/04/2023 10:32:57 AM By: Duanne Guess MD FACS Entered By: Duanne Guess on 10/04/2023 07:32:57 -------------------------------------------------------------------------------- Physician Orders Details Patient Name: Date of Service: [facility], [place] [name] 10/04/2023 9:00 A M Medical Record Number: [id number] Patient Account Number: [id number] Date of Birth/Sex: Treating RN: 15-Jun-1961 (62 y.o. Billy [name], Teresa Primary Care Provider: Sharon Seller Other Clinician: Referring Provider: Treating Provider/Extender: Charise Killian in Treatment: 0 The following information was  scribed by: Zenaida Deed The information was scribed for: Duanne Guess Verbal / Phone Orders: No Diagnosis Coding ICD-10 Coding Code Description E66.01 Morbid (severe) obesity due to excess calories J44.9 Chronic obstructive pulmonary disease, unspecified I50.32 Chronic diastolic (congestive) heart failure E11.65 Type 2 diabetes mellitus with hyperglycemia Follow-up Appointments ppointment in 1 week. - Dr. Lady Gary RM 2 Return A Wed 11/6 @ 1:30 pm Anesthetic (In clinic) Topical Lidocaine 4% applied to wound bed Bathing/ Shower/ Hygiene May shower with protection but do not get wound dressing(s) wet. Protect dressing(s) with water repellant cover (for example, large plastic bag) or  MISTIQUE, AUTEN B ([id number]) [id number] Page 1 of 11 Visit Report for 10/04/2023 Chief Complaint Document Details Patient Name: Date of Service: [facility], [place] B. 10/04/2023 9:00 A M Medical Record Number: [id number] Patient Account Number: [id number] Date of Birth/Sex: Treating RN: 23-Jun-1961 (62 y.o. 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 10/04/2023 9:00 A M Medical Record Number: [id number] Patient Account Number: [id number] Date of Birth/Sex: Treating RN: January 20, 1961 (62 y.o. [name] Primary Care Provider: Sharon Seller Other Clinician: Referring Provider: Treating Provider/Extender: Charise Killian in Treatment: 0 Debridement Performed for Assessment: Wound #3 Right Calcaneus Performed By: Physician Duanne Guess, MD The following information was scribed by: Zenaida Deed The information was scribed for: Duanne Guess Debridement Type: Debridement Severity of Tissue Pre Debridement: Fat layer exposed Level of Consciousness (Pre-procedure): Awake and Alert Pre-procedure Verification/Time Out Yes - 10:05 Taken: Start Time: 10:07 Pain Control: Lidocaine 4% T opical  Solution Percent of Wound Bed Debrided: 150% T Area Debrided (cm): otal 34.29 Tissue and other material debrided: Viable, Non-Viable, Slough, Subcutaneous, Skin: Epidermis, Slough Level: Skin/Subcutaneous Tissue Debridement Description: Excisional Instrument: Curette, Forceps, Scissors Specimen: Tissue Culture Number of Specimens T aken: 1 Bleeding: Minimum Hemostasis Achieved: Pressure Procedural Pain: 0 Post Procedural Pain: 0 Response to Treatment: Procedure was tolerated well Level of Consciousness (Post- Awake and Alert procedure): Post Debridement Measurements of Total Wound NAZYIAH, WELLMAN B ([id number]) [id number] Page 2 of 11 Length: (cm) 5.6 Width: (cm) 5.2 Depth: (cm) 0.1 Volume: (cm) 2.287 Character of Wound/Ulcer Post Debridement: Improved Severity of Tissue Post Debridement: Fat layer exposed Post Procedure Diagnosis Same as Pre-procedure Electronic Signature(s) Signed: 10/04/2023 10:45:17 AM By: Duanne Guess MD FACS Signed: 10/04/2023 2:40:12 PM By: Zenaida Deed RN, BSN Entered By: Zenaida Deed on 10/04/2023 07:14:27 -------------------------------------------------------------------------------- HPI Details Patient Name: Date of Service: [name], [place] [name] 10/04/2023 9:00 A M Medical Record Number: [id number] Patient Account Number: [id number] Date of Birth/Sex: Treating RN: 03/27/1961 (62 y.o. F) Primary Care Provider: Sharon Seller Other Clinician: Referring Provider: Treating Provider/Extender: Charise Killian in Treatment: 0 History of Present Illness HPI Description: ADMISSION 10/09/2020 This is a 62 year old woman who is a type I diabetic with a last hemoglobin A1c of 10. She is referred from her primary doctor after developing a refractory rash in the folds of her lower abdominal pannus. There is no open wound here however extensive intertrigo with classic satellite  lesions highly suggestive of candidal infection. This is reasonably extensive on the lower abdominal pannus the superior part of her pubic symphysis all the way into the lower abdomen. She has been using nystatin powder and she did receive a course of Diflucan apparently for 10 days from her primary physician although I did not see this anywhere. I think things are somewhat better. Additionally the patient also brought up the fact that she has a small dime sized area on her left posterior calf  MISTIQUE, AUTEN B ([id number]) [id number] Page 1 of 11 Visit Report for 10/04/2023 Chief Complaint Document Details Patient Name: Date of Service: [facility], [place] [facility] B. 10/04/2023 9:00 A M Medical Record Number: [id number] Patient Account Number: [id number] Date of Birth/Sex: Treating RN: 23-Jun-1961 (62 y.o. 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 10/04/2023 9:00 A M Medical Record Number: [id number] Patient Account Number: [id number] Date of Birth/Sex: Treating RN: January 20, 1961 (62 y.o. [name] Primary Care Provider: Sharon Seller Other Clinician: Referring Provider: Treating Provider/Extender: Charise Killian in Treatment: 0 Debridement Performed for Assessment: Wound #3 Right Calcaneus Performed By: Physician Duanne Guess, MD The following information was scribed by: Zenaida Deed The information was scribed for: Duanne Guess Debridement Type: Debridement Severity of Tissue Pre Debridement: Fat layer exposed Level of Consciousness (Pre-procedure): Awake and Alert Pre-procedure Verification/Time Out Yes - 10:05 Taken: Start Time: 10:07 Pain Control: Lidocaine 4% T opical  Solution Percent of Wound Bed Debrided: 150% T Area Debrided (cm): otal 34.29 Tissue and other material debrided: Viable, Non-Viable, Slough, Subcutaneous, Skin: Epidermis, Slough Level: Skin/Subcutaneous Tissue Debridement Description: Excisional Instrument: Curette, Forceps, Scissors Specimen: Tissue Culture Number of Specimens T aken: 1 Bleeding: Minimum Hemostasis Achieved: Pressure Procedural Pain: 0 Post Procedural Pain: 0 Response to Treatment: Procedure was tolerated well Level of Consciousness (Post- Awake and Alert procedure): Post Debridement Measurements of Total Wound NAZYIAH, WELLMAN B ([id number]) [id number] Page 2 of 11 Length: (cm) 5.6 Width: (cm) 5.2 Depth: (cm) 0.1 Volume: (cm) 2.287 Character of Wound/Ulcer Post Debridement: Improved Severity of Tissue Post Debridement: Fat layer exposed Post Procedure Diagnosis Same as Pre-procedure Electronic Signature(s) Signed: 10/04/2023 10:45:17 AM By: Duanne Guess MD FACS Signed: 10/04/2023 2:40:12 PM By: Zenaida Deed RN, BSN Entered By: Zenaida Deed on 10/04/2023 07:14:27 -------------------------------------------------------------------------------- HPI Details Patient Name: Date of Service: [name], [name] 10/04/2023 9:00 A M Medical Record Number: [id number] Patient Account Number: [id number] Date of Birth/Sex: Treating RN: 03/27/1961 (62 y.o. F) Primary Care Provider: Sharon Seller Other Clinician: Referring Provider: Treating Provider/Extender: Charise Killian in Treatment: 0 History of Present Illness HPI Description: ADMISSION 10/09/2020 This is a 62 year old woman who is a type I diabetic with a last hemoglobin A1c of 10. She is referred from her primary doctor after developing a refractory rash in the folds of her lower abdominal pannus. There is no open wound here however extensive intertrigo with classic satellite  lesions highly suggestive of candidal infection. This is reasonably extensive on the lower abdominal pannus the superior part of her pubic symphysis all the way into the lower abdomen. She has been using nystatin powder and she did receive a course of Diflucan apparently for 10 days from her primary physician although I did not see this anywhere. I think things are somewhat better. Additionally the patient also brought up the fact that she has a small dime sized area on her left posterior calf

## 2023-10-11 DIAGNOSIS — I5033 Acute on chronic diastolic (congestive) heart failure: Secondary | ICD-10-CM | POA: Diagnosis not present

## 2023-10-11 DIAGNOSIS — J9601 Acute respiratory failure with hypoxia: Secondary | ICD-10-CM | POA: Diagnosis not present

## 2023-10-11 DIAGNOSIS — Z79899 Other long term (current) drug therapy: Secondary | ICD-10-CM | POA: Diagnosis not present

## 2023-10-11 DIAGNOSIS — L97411 Non-pressure chronic ulcer of right heel and midfoot limited to breakdown of skin: Secondary | ICD-10-CM | POA: Diagnosis not present

## 2023-10-11 DIAGNOSIS — R062 Wheezing: Secondary | ICD-10-CM | POA: Diagnosis not present

## 2023-10-11 DIAGNOSIS — R0989 Other specified symptoms and signs involving the circulatory and respiratory systems: Secondary | ICD-10-CM | POA: Diagnosis not present

## 2023-10-11 DIAGNOSIS — R059 Cough, unspecified: Secondary | ICD-10-CM | POA: Diagnosis not present

## 2023-10-12 ENCOUNTER — Ambulatory Visit: Payer: Medicare HMO | Admitting: Nurse Practitioner

## 2023-10-12 DIAGNOSIS — L089 Local infection of the skin and subcutaneous tissue, unspecified: Secondary | ICD-10-CM | POA: Diagnosis not present

## 2023-10-12 DIAGNOSIS — N390 Urinary tract infection, site not specified: Secondary | ICD-10-CM | POA: Diagnosis not present

## 2023-10-12 DIAGNOSIS — I5033 Acute on chronic diastolic (congestive) heart failure: Secondary | ICD-10-CM | POA: Diagnosis not present

## 2023-10-12 DIAGNOSIS — S91301A Unspecified open wound, right foot, initial encounter: Secondary | ICD-10-CM | POA: Diagnosis not present

## 2023-10-12 DIAGNOSIS — J449 Chronic obstructive pulmonary disease, unspecified: Secondary | ICD-10-CM | POA: Diagnosis not present

## 2023-10-12 DIAGNOSIS — E11621 Type 2 diabetes mellitus with foot ulcer: Secondary | ICD-10-CM | POA: Diagnosis not present

## 2023-10-12 DIAGNOSIS — L97419 Non-pressure chronic ulcer of right heel and midfoot with unspecified severity: Secondary | ICD-10-CM | POA: Diagnosis not present

## 2023-10-12 DIAGNOSIS — Z87440 Personal history of urinary (tract) infections: Secondary | ICD-10-CM | POA: Diagnosis not present

## 2023-10-12 DIAGNOSIS — N3281 Overactive bladder: Secondary | ICD-10-CM | POA: Diagnosis not present

## 2023-10-18 ENCOUNTER — Encounter (HOSPITAL_BASED_OUTPATIENT_CLINIC_OR_DEPARTMENT_OTHER): Payer: Medicare HMO | Attending: General Surgery | Admitting: General Surgery

## 2023-10-18 DIAGNOSIS — I110 Hypertensive heart disease with heart failure: Secondary | ICD-10-CM | POA: Insufficient documentation

## 2023-10-18 DIAGNOSIS — L304 Erythema intertrigo: Secondary | ICD-10-CM | POA: Diagnosis not present

## 2023-10-18 DIAGNOSIS — E10621 Type 1 diabetes mellitus with foot ulcer: Secondary | ICD-10-CM | POA: Insufficient documentation

## 2023-10-18 DIAGNOSIS — G4733 Obstructive sleep apnea (adult) (pediatric): Secondary | ICD-10-CM | POA: Diagnosis not present

## 2023-10-18 DIAGNOSIS — I872 Venous insufficiency (chronic) (peripheral): Secondary | ICD-10-CM | POA: Insufficient documentation

## 2023-10-18 DIAGNOSIS — I2510 Atherosclerotic heart disease of native coronary artery without angina pectoris: Secondary | ICD-10-CM | POA: Diagnosis not present

## 2023-10-18 DIAGNOSIS — I890 Lymphedema, not elsewhere classified: Secondary | ICD-10-CM | POA: Diagnosis not present

## 2023-10-18 DIAGNOSIS — M1990 Unspecified osteoarthritis, unspecified site: Secondary | ICD-10-CM | POA: Insufficient documentation

## 2023-10-18 DIAGNOSIS — E11621 Type 2 diabetes mellitus with foot ulcer: Secondary | ICD-10-CM | POA: Diagnosis not present

## 2023-10-18 DIAGNOSIS — L97412 Non-pressure chronic ulcer of right heel and midfoot with fat layer exposed: Secondary | ICD-10-CM | POA: Diagnosis not present

## 2023-10-18 DIAGNOSIS — E1040 Type 1 diabetes mellitus with diabetic neuropathy, unspecified: Secondary | ICD-10-CM | POA: Diagnosis not present

## 2023-10-18 DIAGNOSIS — I5032 Chronic diastolic (congestive) heart failure: Secondary | ICD-10-CM | POA: Insufficient documentation

## 2023-10-18 DIAGNOSIS — Z794 Long term (current) use of insulin: Secondary | ICD-10-CM | POA: Diagnosis not present

## 2023-10-18 NOTE — Progress Notes (Signed)
[name] ([id number]) [id number] Page 1 of 10 Visit Report for 10/18/2023 Chief Complaint Document Details Patient Name: Date of Service: [name], [name]. 10/18/2023 11:15 A M Medical Record Number: [id number] Patient Account Number: [id number] Date of Birth/Sex: Treating RN: [birth year] (62 y.o. 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 10/18/2023 11:15 A M Medical Record Number: [id number] Patient Account Number: [id number] Date of Birth/Sex: Treating RN: 1961-07-27 (62 y.o. F) Primary Care Provider: Sharon Seller Other Clinician: Referring Provider: Treating Provider/Extender: Charise Killian in Treatment: 2 Debridement Performed for Assessment: Wound #3 Right Calcaneus Performed By: Physician Duanne Guess, MD The following information was scribed by: Zenaida Deed The information was scribed for: Duanne Guess Debridement Type: Debridement Severity of Tissue Pre Debridement: Fat layer exposed Level of Consciousness (Pre-procedure): Awake and Alert Pre-procedure Verification/Time Out Yes - 11:45 Taken: Start Time: 11:46 Pain Control: Lidocaine 4% Topical  Solution Percent of Wound Bed Debrided: 120% T Area Debrided (cm): otal 12.81 Tissue and other material debrided: Non-Viable, Callus, Slough, Subcutaneous, Skin: Epidermis, Slough Level: Skin/Subcutaneous Tissue Debridement Description: Excisional Instrument: Curette Bleeding: Minimum Hemostasis Achieved: Pressure Procedural Pain: 0 Post Procedural Pain: 0 Response to Treatment: Procedure was tolerated well Level of Consciousness (Post- Awake and Alert procedure): Post Debridement Measurements of Total Wound Length: (cm) 4 Everetts, [name] ([id number]) [id number] Page 2 of 10 Width: (cm) 3.4 Depth: (cm) 0.1 Volume: (cm) 1.068 Character of Wound/Ulcer Post Debridement: Improved Severity of Tissue Post Debridement: Fat layer exposed Post Procedure Diagnosis Same as Pre-procedure Electronic Signature(s) Signed: 10/18/2023 12:16:13 PM By: Duanne Guess MD FACS Entered By: Duanne Guess on 10/18/2023 12:16:13 -------------------------------------------------------------------------------- HPI Details Patient Name: Date of Service: [name], [name]. 10/18/2023 11:15 A M Medical Record Number: [id number] Patient Account Number: [id number] Date of Birth/Sex: Treating RN: 1961/08/20 (62 y.o. F) Primary Care Provider: Sharon Seller Other Clinician: Referring Provider: Treating Provider/Extender: Charise Killian in Treatment: 2 History of Present Illness HPI Description: ADMISSION 10/09/2020 This is a 62 year old woman who is a type I diabetic with a last hemoglobin A1c of 10. She is referred from her primary doctor after developing a refractory rash in the folds of her lower abdominal pannus. There is no open wound here however extensive intertrigo with classic satellite lesions highly suggestive of candidal infection. This is reasonably extensive on the lower abdominal pannus the superior part of her pubic  symphysis all the way into the lower abdomen. She has been using nystatin powder and she did receive a course of Diflucan apparently for 10 days from her primary physician although I did not see this anywhere. I think things are somewhat better. Additionally the patient also brought up the fact that she has a small dime sized area on her left posterior calf and asked [place] to look at this. This is been there for about a month. She does not wear compression stockings. Last sleep  she pointed out a rash on her left buttock and left lower back she has been scratching at this. There is no open wound here per se. 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 She has chronic venous insufficiency and lymphedema here she will need stockings. The intertrigo in her lower abdominal pannus still is not much better. I gave her additional 2 weeks of Diflucan and topical nystatin. Perhaps minimally better. She is going to need to be more aggressive about separating the folds and keeping this clean and dry. She has a much better looking area under the left breast READMISSION 10/04/2023 ***ABI non-compressible*** This is a now 62 year old super morbidly obese type II diabetic (last hemoglobin A1c 8.5% in July 2024). She has COPD and CHF and is largely nonambulatory. She resides at [facility]. She presents today with a large ulcer on her right heel. It appears to be pressure-induced in origin. Lindaann Pascal has been treating it with Santyl with saline moistened gauze and calcium alginate. Due to failure of the wound to improve, she has been referred to the wound care center for further evaluation and  management. 10/18/2023: The wound looks markedly better this week. There is some dry skin and callus around the opening. There is slough on the surface. The malodor that was present previously has abated. She has completed her course of oral antibiotics that I prescribed in response to her polymicrobial tissue culture. Electronic Signature(s) Signed: 10/18/2023 12:17:35 PM By: Duanne Guess MD FACS Entered By: Duanne Guess on 10/18/2023 12:17:34 Physical Exam Details -------------------------------------------------------------------------------- [name] ([id number]) [id number] Page 3 of 10 Patient Name: Date of Service: [name], Teresa OV [name]. 10/18/2023 11:15 A M Medical Record Number: [id number] Patient Account Number: [id number] Date of Birth/Sex: Treating RN: Sep 25, 1961 (62 y.o. F) Primary Care Provider: Sharon Seller Other Clinician: Referring Provider: Treating Provider/Extender: Charise Killian in Treatment: 2 Constitutional Hypertensive, asymptomatic. . . . no acute distress. Respiratory Normal work of breathing on supplemental oxygen. Notes 10/18/2023: The wound looks markedly better this week. There is some dry skin and callus around the opening. There is slough on the surface. The malodor that was present previously has abated. Electronic Signature(s) Signed: 10/18/2023 12:20:17 PM By: Duanne Guess MD FACS Entered By: Duanne Guess on 10/18/2023 12:20:17 -------------------------------------------------------------------------------- Physician Orders Details Patient Name: Date of Service: [facility], [name]. 10/18/2023 11:15 A M Medical Record Number: [id number] Patient Account Number: [id number] Date of Birth/Sex: Treating RN: 13-Jun-1961 (62 y.o. Billy Coast, Bonita Quin Primary Care Provider: Sharon Seller Other Clinician: Referring Provider: Treating Provider/Extender: Charise Killian in Treatment: 2 The following information was scribed by: Zenaida Deed The information was scribed for: Duanne Guess Verbal / Phone Orders: No Diagnosis Coding ICD-10 Coding Code Description L97.412 Non-pressure chronic ulcer of right heel and midfoot with fat layer exposed E11.621 Type 2 diabetes mellitus with foot ulcer I50.32 Chronic diastolic (congestive) heart failure E11.65 Type 2 diabetes mellitus with hyperglycemia J44.9 Chronic obstructive pulmonary disease, unspecified E66.01 Morbid (severe) obesity due to excess calories Follow-up Appointments ppointment in 2 weeks. - Dr. Lady Gary RM 3 Return A Monday 11/25 @ 11:15 am Anesthetic (In clinic) Topical Lidocaine 4% applied to  wound bed Bathing/ Shower/ Hygiene May shower with protection but do not get wound dressing(s) wet. Protect dressing(s) with water repellant cover (for example, large plastic bag) or a cast cover and may then take shower. Off-Loading Heel suspension boot to: - globoped heel offloading sandal to right foot to transfer and walk with PT Prevalon Boot - prevalon boot to right foot at all times except while working with PT Wound Treatment Wound #3 - Calcaneus Wound Laterality: Right Cleanser: Wound Cleanser 1 x Per Day/30 Days Discharge Instructions: Cleanse the wound with wound cleanser prior to applying a clean dressing using gauze sponges, not tissue or cotton balls. Prim Dressing: Maxorb Extra Ag+ Alginate Dressing, 4x4.75 (in/in) ary 1 x Per Day/30 Days ALLESANDRA, BREYER ([id number]) [phone].pdf Page 4 of 10 Discharge Instructions: Apply to wound bed as instructed Secondary Dressing: ALLEVYN Heel 4 1/2in x 5 1/2in / 10.5cm x 13.5cm 1 x Per Day/30 Days Discharge Instructions: Apply over primary dressing as directed. Secondary Dressing: Woven Gauze Sponge, Non-Sterile 4x4 in 1 x Per Day/30 Days Discharge Instructions: Apply over  primary dressing as directed. Secured With: [employer], 4.5x3.1 (in/yd) 1 x Per Day/30 Days Discharge Instructions: Secure with Kerlix as directed. Secured With: Transpore Surgical Tape, 2x10 (in/yd) 1 x Per Day/30 Days Discharge Instructions: Secure dressing with tape as directed. Electronic Signature(s) Signed: 10/18/2023 12:22:49 PM By: Duanne Guess MD FACS Entered By: Duanne Guess on 10/18/2023 12:20:28 -------------------------------------------------------------------------------- Problem List Details Patient Name: Date of Service: [facility], [name]. 10/18/2023 11:15 A M Medical Record Number: [id number] Patient Account Number: [id number] Date of Birth/Sex: Treating RN: 1960-12-16 (62 y.o. Billy Coast, Bonita Quin Primary Care Provider: Sharon Seller Other Clinician: Referring Provider: Treating Provider/Extender: Charise Killian in Treatment: 2 Active Problems ICD-10 Encounter Code Description Active Date MDM Diagnosis L97.412 Non-pressure chronic ulcer of right heel and midfoot with fat layer exposed 10/04/2023 No Yes E11.621 Type 2 diabetes mellitus with foot ulcer 10/04/2023 No Yes I50.32 Chronic diastolic (congestive) heart failure 10/04/2023 No Yes E11.65 Type 2 diabetes mellitus with hyperglycemia 10/04/2023 No Yes J44.9 Chronic obstructive pulmonary disease, unspecified 10/04/2023 No Yes E66.01 Morbid (severe) obesity due to excess calories 10/04/2023 No Yes Inactive Problems Resolved Problems Electronic Signature(s) Signed: 10/18/2023 12:15:13 PM By: Duanne Guess MD FACS Entered By: Duanne Guess on 10/18/2023 12:15:13 [name] ([id number]) [id number] Page 5 of 10 -------------------------------------------------------------------------------- Progress Note Details Patient Name: Date of Service: [name], Teresa UR [name]. 10/18/2023 11:15 A M Medical Record Number:  [id number] Patient Account Number: [id number] Date of Birth/Sex: Treating RN: 1961/03/14 (62 y.o. 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 She is referred from her primary doctor after developing a refractory rash in the folds of her lower abdominal pannus. There is no open wound here however extensive intertrigo with classic satellite lesions highly suggestive of candidal infection. This is reasonably extensive on the lower abdominal pannus the superior part of her pubic symphysis all the way  into the lower abdomen. She has been using nystatin powder and she did receive a course of Diflucan apparently for 10 days from her primary physician although I did not see this anywhere. I think things are somewhat better. Additionally the patient also brought up the fact that she has a small dime sized area on her left posterior calf and asked [place] to look at this. This is been there for about a month. She does not wear compression stockings. Last sleep she pointed out a rash on her left buttock and left lower back she has been scratching at this. There is no open wound here per se. Past medical history includes type 1 diabetes the patient is fairly adamant about this poorly controlled, obstructive sleep apnea, COPD, recurrent hypokalemia, diastolic heart failure, QT prolongation apparently related to hypokalemia, morbid  obesity ABIs on the left in our clinic were noncompressible 11/11; the patient's area on the left posterior calf is closed. She has chronic venous insufficiency and lymphedema here she will need stockings. The intertrigo in her lower abdominal pannus still is not much better. I gave her additional 2 weeks of Diflucan and topical nystatin. Perhaps minimally better. She is going to need to be more aggressive about separating the folds and keeping this clean and dry. She has a much better looking area under the left breast READMISSION 10/04/2023 ***ABI non-compressible*** This is a now 62 year old super morbidly obese type II diabetic (last hemoglobin A1c 8.5% in July 2024). She has COPD and CHF and is largely nonambulatory. She resides at [facility]. She presents today with a large ulcer on her right heel. It appears to be pressure-induced in origin. Lindaann Pascal has been treating it with Santyl with saline moistened gauze and calcium alginate. Due to failure of the wound to improve, she has been referred to the wound care center for further evaluation and management. 10/18/2023: The wound looks markedly better this week. There is some dry skin and callus around the opening. There is slough on the surface. The malodor that was present previously has abated. She has completed her course of oral antibiotics that I prescribed in response to her polymicrobial tissue culture. Patient History Information obtained from Patient, Chart. Family History Cancer - Maternal Grandparents, Diabetes - Father,Siblings,Maternal Grandparents,Paternal Grandparents, Heart Disease - Father,Siblings,Child,Maternal Grandparents, Hypertension - Maternal Grandparents,Paternal Grandparents,Mother,Father,Siblings,Child, Kidney Disease - Father,Mother, Stroke - Mother,Siblings, Thyroid Problems - Father,Siblings, No family history of Hereditary Spherocytosis, Lung Disease, Seizures, Tuberculosis. Social History Never  smoker, Marital Status - Married, Alcohol Use - Never, Drug Use - No History, Caffeine Use - Moderate. Medical History Eyes Patient has history of Cataracts - extractions Denies history of Glaucoma, Optic Neuritis Ear/Nose/Mouth/Throat Denies history of Chronic sinus problems/congestion, Middle ear problems Hematologic/Lymphatic Patient has history of Anemia Denies history of Hemophilia, Human Immunodeficiency Virus, Lymphedema, Sickle Cell Disease Respiratory Patient has history of Asthma, Chronic Obstructive Pulmonary Disease (COPD), Sleep Apnea Denies history of Aspiration, Pneumothorax, Tuberculosis Cardiovascular Patient has history of Congestive Heart Failure, Coronary Artery Disease, Hypertension, Peripheral Venous Disease Denies history of Angina, Arrhythmia, Deep Vein Thrombosis, Hypotension, Myocardial Infarction, Peripheral Arterial Disease, Phlebitis Gastrointestinal [name], [name] ([id number]) [id number] Page 6 of 10 Denies history of Cirrhosis , Colitis, Crohns, Hepatitis A, Hepatitis [name], Hepatitis C Endocrine Patient has history of Type II Diabetes Denies history of Type I Diabetes Genitourinary Denies history of End Stage Renal Disease Immunological Denies history of Lupus Erythematosus, Raynauds, Scleroderma Integumentary (Skin) Denies history of History of Burn Musculoskeletal Patient has  history of Osteoarthritis Denies history of Gout, Rheumatoid Arthritis, Osteomyelitis Neurologic Patient has history of Neuropathy Denies history of Dementia, Quadriplegia, Paraplegia, Seizure Disorder Oncologic Denies history of Received Chemotherapy, Received Radiation Psychiatric Patient has history of Confinement Anxiety Denies history of Anorexia/bulimia Hospitalization/Surgery History - polypectomy 04/2022. - cataract extraction. - c-section. - cholecystectomy. - sigmoid colon resection. Medical A Surgical History  Notes nd Constitutional Symptoms (General Health) morbid obesity Respiratory O2 dependent Cardiovascular hyperlipidemia, cardiomegaly Gastrointestinal GERD, IBS Endocrine hypothyroidism Genitourinary overactive bladder, uterine prolapse, right renal cyst Oncologic sigmoid colon Psychiatric depression Objective Constitutional Hypertensive, asymptomatic. no acute distress. Vitals Time Taken: 11:29 AM, Height: 61 in, Weight: 283 lbs, BMI: 53.5, Temperature: 98.6 F, Pulse: 67 bpm, Respiratory Rate: 20 breaths/min, Blood Pressure: 156/76 mmHg, Capillary Blood Glucose: 301 mg/dl. General Notes: glucose per pt report this morning Respiratory Normal work of breathing on supplemental oxygen. General Notes: 10/18/2023: The wound looks markedly better this week. There is some dry skin and callus around the opening. There is slough on the surface. The malodor that was present previously has abated. Integumentary (Hair, Skin) Wound #3 status is Open. Original cause of wound was Blister. The date acquired was: 07/07/2023. The wound has been in treatment 2 weeks. The wound is located on the Right Calcaneus. The wound measures 4cm length x 3.4cm width x 0.1cm depth; 10.681cm^2 area and 1.068cm^3 volume. There is Fat Layer (Subcutaneous Tissue) exposed. There is no tunneling or undermining noted. There is a medium amount of serous drainage noted. The wound margin is flat and intact. There is large (67-100%) pink, pale granulation within the wound bed. There is a small (1-33%) amount of necrotic tissue within the wound bed including Adherent Slough. The periwound skin appearance had no abnormalities noted for texture. The periwound skin appearance exhibited: Dry/Scaly. The periwound skin appearance did not exhibit: Maceration, Rubor, Erythema. Periwound temperature was noted as No Abnormality. The periwound has tenderness on palpation. Assessment Active Problems ICD-10 Non-pressure chronic ulcer  of right heel and midfoot with fat layer exposed Type 2 diabetes mellitus with foot ulcer Chronic diastolic (congestive) heart failure Type 2 diabetes mellitus with hyperglycemia [name], [name] ([id number]) [id number] Page 7 of 10 Chronic obstructive pulmonary disease, unspecified Morbid (severe) obesity due to excess calories Procedures Wound #3 Pre-procedure diagnosis of Wound #3 is a Diabetic Wound/Ulcer of the Lower Extremity located on the Right Calcaneus .Severity of Tissue Pre Debridement is: Fat layer exposed. There was a Excisional Skin/Subcutaneous Tissue Debridement with a total area of 12.81 sq cm performed by Duanne Guess, MD. With the following instrument(s): Curette to remove Non-Viable tissue/material. Material removed includes Callus, Subcutaneous Tissue, Slough, and Skin: Epidermis after achieving pain control using Lidocaine 4% Topical Solution. No specimens were taken. A time out was conducted at 11:45, prior to the start of the procedure. A Minimum amount of bleeding was controlled with Pressure. The procedure was tolerated well with a pain level of 0 throughout and a pain level of 0 following the procedure. Post Debridement Measurements: 4cm length x 3.4cm width x 0.1cm depth; 1.068cm^3 volume. Character of Wound/Ulcer Post Debridement is improved. Severity of Tissue Post Debridement is: Fat layer exposed. Post procedure Diagnosis Wound #3: Same as Pre-Procedure Plan Follow-up Appointments: Return Appointment in 2 weeks. - Dr. Lady Gary RM 3 Monday 11/25 @ 11:15 am Anesthetic: (In clinic) Topical Lidocaine 4% applied to wound bed Bathing/ Shower/ Hygiene: May shower with protection but do not get wound dressing(s) wet. Protect dressing(s) with water repellant cover (for  example, large plastic bag) or a cast cover and may then take shower. Off-Loading: Heel suspension boot to: - globoped heel offloading sandal to right foot to transfer  and walk with PT Prevalon Boot - prevalon boot to right foot at all times except while working with PT WOUND #3: - Calcaneus Wound Laterality: Right Cleanser: Wound Cleanser 1 x Per Day/30 Days Discharge Instructions: Cleanse the wound with wound cleanser prior to applying a clean dressing using gauze sponges, not tissue or cotton balls. Prim Dressing: Maxorb Extra Ag+ Alginate Dressing, 4x4.75 (in/in) 1 x Per Day/30 Days ary Discharge Instructions: Apply to wound bed as instructed Secondary Dressing: ALLEVYN Heel 4 1/2in x 5 1/2in / 10.5cm x 13.5cm 1 x Per Day/30 Days Discharge Instructions: Apply over primary dressing as directed. Secondary Dressing: Woven Gauze Sponge, Non-Sterile 4x4 in 1 x Per Day/30 Days Discharge Instructions: Apply over primary dressing as directed. Secured With: [employer], 4.5x3.1 (in/yd) 1 x Per Day/30 Days Discharge Instructions: Secure with Kerlix as directed. Secured With: Transpore Surgical T ape, 2x10 (in/yd) 1 x Per Day/30 Days Discharge Instructions: Secure dressing with tape as directed. 10/18/2023: The wound looks markedly better this week. There is some dry skin and callus around the opening. There is slough on the surface. The malodor that was present previously has abated. She has completed her course of oral antibiotics that I prescribed in response to her polymicrobial tissue culture. I used a curette to debride callus, skin, slough, and subcutaneous tissue from her wound. We will continue silver alginate with an Allevyn heel cup. She is also using a Prevalon boot at home and a heel offloading sandal for ambulation. Follow-up in 2 weeks. Electronic Signature(s) Signed: 10/18/2023 12:22:03 PM By: Duanne Guess MD FACS Entered By: Duanne Guess on 10/18/2023 12:22:03 -------------------------------------------------------------------------------- HxROS Details Patient Name: Date of Service: [name], [name]. 10/18/2023 11:15 A  M Medical Record Number: [id number] Patient Account Number: [id number] Date of Birth/Sex: Treating RN: 08/29/1961 (61 y.o. F) Primary Care Provider: Sharon Seller Other Clinician: Referring Provider: Treating Provider/Extender: Charise Killian in Treatment: 2 [name], [name] [name] ([id number]) [id number] Page 8 of 10 Information Obtained From Patient Chart Constitutional Symptoms (General Health) Medical History: Past Medical History Notes: morbid obesity Eyes Medical History: Positive for: Cataracts - extractions Negative for: Glaucoma; Optic Neuritis Ear/Nose/Mouth/Throat Medical History: Negative for: Chronic sinus problems/congestion; Middle ear problems Hematologic/Lymphatic Medical History: Positive for: Anemia Negative for: Hemophilia; Human Immunodeficiency Virus; Lymphedema; Sickle Cell Disease Respiratory Medical History: Positive for: Asthma; Chronic Obstructive Pulmonary Disease (COPD); Sleep Apnea Negative for: Aspiration; Pneumothorax; Tuberculosis Past Medical History Notes: O2 dependent Cardiovascular Medical History: Positive for: Congestive Heart Failure; Coronary Artery Disease; Hypertension; Peripheral Venous Disease Negative for: Angina; Arrhythmia; Deep Vein Thrombosis; Hypotension; Myocardial Infarction; Peripheral Arterial Disease; Phlebitis Past Medical History Notes: hyperlipidemia, cardiomegaly Gastrointestinal Medical History: Negative for: Cirrhosis ; Colitis; Crohns; Hepatitis A; Hepatitis [name]; Hepatitis C Past Medical History Notes: GERD, IBS Endocrine Medical History: Positive for: Type II Diabetes Negative for: Type I Diabetes Past Medical History Notes: hypothyroidism Time with diabetes: 47 Treated with: Insulin Blood sugar tested every day: Yes Tested : checked at facility Genitourinary Medical History: Negative for: End Stage Renal Disease Past Medical History  Notes: overactive bladder, uterine prolapse, right renal cyst Immunological Medical History: Negative for: Lupus Erythematosus; Raynauds; Scleroderma Integumentary (Skin) Medical History: Negative for: History of Burn Musculoskeletal Medical History: Positive for: Osteoarthritis ANJANAE, HASSAN [name] ([id number]) [id number] Page 9 of  10 Negative for: Gout; Rheumatoid Arthritis; Osteomyelitis Neurologic Medical History: Positive for: Neuropathy Negative for: Dementia; Quadriplegia; Paraplegia; Seizure Disorder Oncologic Medical History: Negative for: Received Chemotherapy; Received Radiation Past Medical History Notes: sigmoid colon Psychiatric Medical History: Positive for: Confinement Anxiety Negative for: Anorexia/bulimia Past Medical History Notes: depression HBO Extended History Items Eyes: Cataracts Immunizations Pneumococcal Vaccine: Received Pneumococcal Vaccination: Yes Received Pneumococcal Vaccination On or After 60th Birthday: No Implantable Devices None Hospitalization / Surgery History Type of Hospitalization/Surgery polypectomy 04/2022 cataract extraction c-section cholecystectomy sigmoid colon resection Family and Social History Cancer: Yes - Maternal Grandparents; Diabetes: Yes - Father,Siblings,Maternal Grandparents,Paternal Grandparents; Heart Disease: Yes - Father,Siblings,Child,Maternal Grandparents; Hereditary Spherocytosis: No; Hypertension: Yes - Maternal Grandparents,Paternal Grandparents,Mother,Father,Siblings,Child; Kidney Disease: Yes - Father,Mother; Lung Disease: No; Seizures: No; Stroke: Yes - Mother,Siblings; Thyroid Problems: Yes - Father,Siblings; Tuberculosis: No; Never smoker; Marital Status - Married; Alcohol Use: Never; Drug Use: No History; Caffeine Use: Moderate; Financial Concerns: No; Food, Clothing or Shelter Needs: No; Support System Lacking: No; Transportation Concerns: No Electronic  Signature(s) Signed: 10/18/2023 12:22:49 PM By: Duanne Guess MD FACS Entered By: Duanne Guess on 10/18/2023 12:18:02 -------------------------------------------------------------------------------- SuperBill Details Patient Name: Date of Service: [facility], [name]. 10/18/2023 Medical Record Number: [id number] Patient Account Number: [id number] Date of Birth/Sex: Treating RN: August 27, 1961 (62 y.o. F) Primary Care Provider: Sharon Seller Other Clinician: Referring Provider: Treating Provider/Extender: Charise Killian in Treatment: 2 Diagnosis Coding ICD-10 Codes KARLYN, BAYES ([id number]) [id number] Page 10 of 10 Code Description [phone] Non-pressure chronic ulcer of right heel and midfoot with fat layer exposed E11.621 Type 2 diabetes mellitus with foot ulcer I50.32 Chronic diastolic (congestive) heart failure E11.65 Type 2 diabetes mellitus with hyperglycemia J44.9 Chronic obstructive pulmonary disease, unspecified E66.01 Morbid (severe) obesity due to excess calories Facility Procedures : CPT4 Code: 10932355 Description: 11042 - DEB SUBQ TISSUE 20 SQ CM/< ICD-10 Diagnosis Description L97.412 Non-pressure chronic ulcer of right heel and midfoot with fat layer exposed Modifier: Quantity: 1 Physician Procedures : CPT4 Code Description Modifier [id number] 99213 - WC PHYS LEVEL 3 - EST PT ICD-10 Diagnosis Description L97.412 Non-pressure chronic ulcer of right heel and midfoot with fat layer exposed E11.621 Type 2 diabetes mellitus with foot ulcer I50.32 Chronic  diastolic (congestive) heart failure E11.65 Type 2 diabetes mellitus with hyperglycemia Quantity: 1 : [id number] 11042 - WC PHYS SUBQ TISS 20 SQ CM ICD-10 Diagnosis Description L97.412 Non-pressure chronic ulcer of right heel and midfoot with fat layer exposed Quantity: 1 Electronic Signature(s) Signed: 10/18/2023 12:22:22 PM By: Duanne Guess MD  FACS Entered By: Duanne Guess on 10/18/2023 12:22:22

## 2023-10-18 NOTE — Progress Notes (Signed)
[name], [name] ([id number]) [id number] Page 1 of 8 Visit Report for 10/18/2023 Arrival Information Details Patient Name: Date of Service: [name], [name]. 10/18/2023 11:15 A M Medical Record Number: [id number] Patient Account Number: [id number] Date of Birth/Sex: Treating RN: [birth year] (62 y.o. [name] Benjaman Artman: Sharon Seller Other Clinician: Referring Azhane Eckart: Treating Aleczander Fandino/Extender: Charise Killian in Treatment: 2 Visit Information History Since Last Visit Added or deleted any medications: No Patient Arrived: Wheel Chair Any new allergies or adverse reactions: No Arrival Time: 11:21 Had a fall or experienced change in No Accompanied By: spouse activities of daily living that may affect Transfer Assistance: None risk of falls: Patient Identification Verified: Yes Signs or symptoms of abuse/neglect since last No Secondary Verification Process Completed: Yes visito Patient Requires Transmission-Based Precautions: No Hospitalized since last visit: No Patient Has Alerts: Yes Implantable device outside of the clinic excluding No Patient Alerts: R ABI N/C cellular tissue based products placed in the center since last visit: Has Dressing in Place as Prescribed: Yes Has Footwear/Offloading in Place as Prescribed: Yes Right: Other:globoped heel offloader Pain Present Now: Yes Electronic Signature(s) Signed: 10/18/2023 4:40:40 PM By: Zenaida Deed RN, BSN Entered By: Zenaida Deed on 10/18/2023 08:29:10 -------------------------------------------------------------------------------- Encounter Discharge Information Details Patient Name: Date of Service: [facility] [name], [name]. 10/18/2023 11:15 A M Medical Record Number: [id number] Patient Account Number: [id number] Date of Birth/Sex: Treating RN: 1961/05/07 (63 y.o. [name] Arlow Spiers: Sharon Seller  Other Clinician: Referring Illeana Edick: Treating Larisa Lanius/Extender: Charise Killian in Treatment: 2 Encounter Discharge Information Items Post Procedure Vitals Discharge Condition: Stable Temperature (F): 98.6 Ambulatory Status: Wheelchair Pulse (bpm): 67 Discharge Destination: Skilled Nursing Facility Respiratory Rate (breaths/min): 20 Telephoned: No Blood Pressure (mmHg): 156/76 Orders Sent: Yes Transportation: Private Auto Accompanied By: spouse Schedule Follow-up Appointment: Yes Clinical Summary of Care: Patient Declined Electronic Signature(s) Signed: 10/18/2023 4:40:40 PM By: Zenaida Deed RN, BSN Entered By: Zenaida Deed on 10/18/2023 09:04:47 [name] ([id number]) [id number] Page 2 of 8 -------------------------------------------------------------------------------- Lower Extremity Assessment Details Patient Name: Date of Service: [name], [name]. 10/18/2023 11:15 A M Medical Record Number: [id number] Patient Account Number: [id number] Date of Birth/Sex: Treating RN: Jul 30, 1961 (62 y.o. [name] Cyanna Neace: Sharon Seller Other Clinician: Referring Ansar Skoda: Treating Tamma Brigandi/Extender: Charise Killian in Treatment: 2 Edema Assessment Assessed: [Left: No] [Right: No] Edema: [Left: Ye] [Right: s] Calf Left: Right: Point of Measurement: From Medial Instep 41.8 cm Ankle Left: Right: Point of Measurement: From Medial Instep 21.7 cm Vascular Assessment Pulses: Dorsalis Pedis Palpable: [Right:Yes] Extremity colors, hair growth, and conditions: Extremity Color: [Right:Hyperpigmented] Hair Growth on Extremity: [Right:No] Temperature of Extremity: [Right:Warm] Capillary Refill: [Right:< 3 seconds] Dependent Rubor: [Right:No No] Electronic Signature(s) Signed: 10/18/2023 4:40:40 PM By: Zenaida Deed RN, BSN Entered By: Zenaida Deed on  10/18/2023 08:33:07 -------------------------------------------------------------------------------- Multi Wound Chart Details Patient Name: Date of Service: [facility] [name], [name]. 10/18/2023 11:15 A M Medical Record Number: [id number] Patient Account Number: [id number] Date of Birth/Sex: Treating RN: [birth year] (62 y.o. F) Primary Care Elli Groesbeck: Sharon Seller Other Clinician: Referring Lanasia Porras: Treating Amilia Vandenbrink/Extender: Charise Killian in Treatment: 2 Vital Signs Height(in): 61 Capillary Blood Glucose(mg/dl): 427 Weight(lbs): 062 Pulse(bpm): 67 Body Mass Index(BMI): 53.5 Blood Pressure(mmHg): 156/76 Temperature(F): 98.6 Respiratory Rate(breaths/min): 20 [3:Photos:] [N/A:N/A] Right Calcaneus N/A N/A Wound Location: Blister N/A N/A Wounding Event: Diabetic Wound/Ulcer of the  Lower N/A N/A Primary Etiology: Extremity Cataracts, Anemia, Asthma, Chronic N/A N/A Comorbid History: Obstructive Pulmonary Disease (COPD), Sleep Apnea, Congestive Heart Failure, Coronary Artery Disease, Hypertension, Peripheral Venous Disease, Type II Diabetes, Osteoarthritis, Neuropathy, Confinement Anxiety 07/07/2023 N/A N/A Date Acquired: 2 N/A N/A Weeks of Treatment: Open N/A N/A Wound Status: No N/A N/A Wound Recurrence: 4x3.4x0.1 N/A N/A Measurements L x W x D (cm) 10.681 N/A N/A A (cm) : rea 1.068 N/A N/A Volume (cm) : 53.30% N/A N/A % Reduction in A rea: 53.30% N/A N/A % Reduction in Volume: Grade 2 N/A N/A Classification: Medium N/A N/A Exudate A mount: Serous N/A N/A Exudate Type: amber N/A N/A Exudate Color: Flat and Intact N/A N/A Wound Margin: Large (67-100%) N/A N/A Granulation A mount: Pink, Pale N/A N/A Granulation Quality: Small (1-33%) N/A N/A Necrotic A mount: Fat Layer (Subcutaneous Tissue): Yes N/A N/A Exposed Structures: Fascia: No Tendon: No Muscle: No Joint: No Bone: No Small (1-33%) N/A  N/A Epithelialization: Debridement - Excisional N/A N/A Debridement: Pre-procedure Verification/Time Out 11:45 N/A N/A Taken: Lidocaine 4% Topical Solution N/A N/A Pain Control: Callus, Subcutaneous, Slough N/A N/A Tissue Debrided: Skin/Subcutaneous Tissue N/A N/A Level: 12.81 N/A N/A Debridement A (sq cm): rea Curette N/A N/A Instrument: Minimum N/A N/A Bleeding: Pressure N/A N/A Hemostasis A chieved: 0 N/A N/A Procedural Pain: 0 N/A N/A Post Procedural Pain: Procedure was tolerated well N/A N/A Debridement Treatment Response: 4x3.4x0.1 N/A N/A Post Debridement Measurements L x W x D (cm) 1.068 N/A N/A Post Debridement Volume: (cm) No Abnormalities Noted N/A N/A Periwound Skin Texture: Dry/Scaly: Yes N/A N/A Periwound Skin Moisture: Maceration: No Erythema: No N/A N/A Periwound Skin Color: Rubor: No No Abnormality N/A N/A Temperature: Yes N/A N/A Tenderness on Palpation: Debridement N/A N/A Procedures Performed: Treatment Notes Wound #3 (Calcaneus) Wound Laterality: Right Cleanser Wound Cleanser Discharge Instruction: Cleanse the wound with wound cleanser prior to applying a clean dressing using gauze sponges, not tissue or cotton balls. Peri-Wound Care Topical Primary Dressing Maxorb Extra Ag+ Alginate Dressing, 4x4.75 (in/in) Discharge Instruction: Apply to wound bed as instructed AUBRIE, POMPEO ([id number]) [id number] Page 4 of 8 Secondary Dressing ALLEVYN Heel 4 1/2in x 5 1/2in / 10.5cm x 13.5cm Discharge Instruction: Apply over primary dressing as directed. Woven Gauze Sponge, Non-Sterile 4x4 in Discharge Instruction: Apply over primary dressing as directed. Secured With [employer], 4.5x3.1 (in/yd) Discharge Instruction: Secure with Kerlix as directed. Transpore Surgical Tape, 2x10 (in/yd) Discharge Instruction: Secure dressing with tape as directed. Compression Wrap Compression  Stockings Add-Ons Electronic Signature(s) Signed: 10/18/2023 12:15:27 PM By: Duanne Guess MD FACS Entered By: Duanne Guess on 10/18/2023 09:15:27 -------------------------------------------------------------------------------- Multi-Disciplinary Care Plan Details Patient Name: Date of Service: [name], [name]. 10/18/2023 11:15 A M Medical Record Number: [id number] Patient Account Number: [id number] Date of Birth/Sex: Treating RN: 01-Oct-1961 (62 y.o. [name] Alfred Harrel: Sharon Seller Other Clinician: Referring Dia Donate: Treating Jourdyn Ferrin/Extender: Charise Killian in Treatment: 2 Multidisciplinary Care Plan reviewed with physician Active Inactive Nutrition Nursing Diagnoses: Impaired glucose control: actual or potential Potential for alteratiion in Nutrition/Potential for imbalanced nutrition Goals: Patient/caregiver will maintain therapeutic glucose control Date Initiated: 10/04/2023 Target Resolution Date: 11/01/2023 Goal Status: Active Interventions: Provide education on elevated blood sugars and impact on wound healing Treatment Activities: Patient referred to Primary Care Physician for further nutritional evaluation : 10/04/2023 Notes: Wound/Skin Impairment Nursing Diagnoses: Impaired tissue integrity Knowledge deficit related to ulceration/compromised skin integrity Goals: Patient/caregiver will verbalize understanding  of skin care regimen Date Initiated: 10/04/2023 Target Resolution Date: 11/01/2023 Goal Status: Active RUMER, WUTHRICH ([id number]) [phone].pdf Page 5 of 8 Ulcer/skin breakdown will have a volume reduction of 30% by week 4 Date Initiated: 10/04/2023 Target Resolution Date: 11/01/2023 Goal Status: Active Interventions: Assess patient/caregiver ability to obtain necessary supplies Assess patient/caregiver ability to perform ulcer/skin care regimen upon  admission and as needed Assess ulceration(s) every visit Provide education on ulcer and skin care Treatment Activities: Skin care regimen initiated : 10/04/2023 Topical wound management initiated : 10/04/2023 Notes: Electronic Signature(s) Signed: 10/18/2023 4:40:40 PM By: Zenaida Deed RN, BSN Entered By: Zenaida Deed on 10/18/2023 08:40:51 -------------------------------------------------------------------------------- Pain Assessment Details Patient Name: Date of Service: [name], [name]. 10/18/2023 11:15 A M Medical Record Number: [id number] Patient Account Number: [id number] Date of Birth/Sex: Treating RN: 1961/11/06 (62 y.o. [name] Zahmir Lalla: Sharon Seller Other Clinician: Referring Shantara Goosby: Treating Dael Howland/Extender: Charise Killian in Treatment: 2 Active Problems Location of Pain Severity and Description of Pain Patient Has Paino Yes Site Locations Pain Location: Pain in Ulcers Rate the pain. Current Pain Level: 0 Worst Pain Level: 7 Least Pain Level: 0 Character of Pain Describe the Pain: Throbbing Pain Management and Medication Current Pain Management: Other: time Is the Current Pain Management Adequate: Adequate How does your wound impact your activities of daily livingo Sleep: Yes Bathing: No Appetite: No Relationship With Others: No Bladder Continence: No Emotions: No Bowel Continence: No Hobbies: No Toileting: No Dressing: No [name], BENYO [name] ([id number]) [id number] Page 6 of 8 Electronic Signature(s) Signed: 10/18/2023 4:40:40 PM By: Zenaida Deed RN, BSN Entered By: Zenaida Deed on 10/18/2023 08:31:08 -------------------------------------------------------------------------------- Patient/Caregiver Education Details Patient Name: Date of Service: [facility], [name]. 11/12/2024andnbsp11:15 A M Medical Record Number: [id number] Patient  Account Number: [id number] Date of Birth/Gender: Treating RN: 29-Dec-1960 (62 y.o. [name] Physician: Sharon Seller Other Clinician: Referring Physician: Treating Physician/Extender: Charise Killian in Treatment: 2 Education Assessment Education Provided To: Patient Education Topics Provided Elevated Blood Sugar/ Impact on Healing: Methods: Explain/Verbal Responses: Reinforcements needed, State content correctly Pressure: Methods: Explain/Verbal Responses: Reinforcements needed, State content correctly Wound/Skin Impairment: Methods: Explain/Verbal Responses: Reinforcements needed, State content correctly Electronic Signature(s) Signed: 10/18/2023 4:40:40 PM By: Zenaida Deed RN, BSN Entered By: Zenaida Deed on 10/18/2023 08:41:34 -------------------------------------------------------------------------------- Wound Assessment Details Patient Name: Date of Service: [name], [name]. 10/18/2023 11:15 A M Medical Record Number: [id number] Patient Account Number: [id number] Date of Birth/Sex: Treating RN: [birth year] (62 y.o. [name] Colyn Miron: Sharon Seller Other Clinician: Referring Juwana Thoreson: Treating Mozell Hardacre/Extender: Charise Killian in Treatment: 2 Wound Status Wound Number: 3 Primary Diabetic Wound/Ulcer of the Lower Extremity Etiology: Wound Location: Right Calcaneus Wound Open Wounding Event: Blister Status: Date Acquired: 07/07/2023 Comorbid Cataracts, Anemia, Asthma, Chronic Obstructive Pulmonary Weeks Of Treatment: 2 History: Disease (COPD), Sleep Apnea, Congestive Heart Failure, Coronary Clustered Wound: No Artery Disease, Hypertension, Peripheral Venous Disease, Type II Diabetes, Osteoarthritis, Neuropathy, Confinement Anxiety [name], [name] ([id number]) [id number] Page 7 of 8 Photos Wound Measurements Length:  (cm) 4 Width: (cm) 3.4 Depth: (cm) 0.1 Area: (cm) 10.681 Volume: (cm) 1.068 % Reduction in Area: 53.3% % Reduction in Volume: 53.3% Epithelialization: Small (1-33%) Tunneling: No Undermining: No Wound Description Classification: Grade 2 Wound Margin: Flat and Intact Exudate Amount: Medium Exudate Type: Serous Exudate Color: amber Foul Odor After Cleansing: No Slough/Fibrino Yes Wound Bed  Granulation Amount: Large (67-100%) Exposed Structure Granulation Quality: Pink, Pale Fascia Exposed: No Necrotic Amount: Small (1-33%) Fat Layer (Subcutaneous Tissue) Exposed: Yes Necrotic Quality: Adherent Slough Tendon Exposed: No Muscle Exposed: No Joint Exposed: No Bone Exposed: No Periwound Skin Texture Texture Color No Abnormalities Noted: Yes No Abnormalities Noted: No Erythema: No Moisture Rubor: No No Abnormalities Noted: No Dry / Scaly: Yes Temperature / Pain Maceration: No Temperature: No Abnormality Tenderness on Palpation: Yes Treatment Notes Wound #3 (Calcaneus) Wound Laterality: Right Cleanser Wound Cleanser Discharge Instruction: Cleanse the wound with wound cleanser prior to applying a clean dressing using gauze sponges, not tissue or cotton balls. Peri-Wound Care Topical Primary Dressing Maxorb Extra Ag+ Alginate Dressing, 4x4.75 (in/in) Discharge Instruction: Apply to wound bed as instructed Secondary Dressing ALLEVYN Heel 4 1/2in x 5 1/2in / 10.5cm x 13.5cm Discharge Instruction: Apply over primary dressing as directed. Woven Gauze Sponge, Non-Sterile 4x4 in Discharge Instruction: Apply over primary dressing as directed. Secured With [employer], 4.5x3.1 (in/yd) Discharge Instruction: Secure with Kerlix as directed. Transpore Surgical Tape, 2x10 (in/yd) Discharge Instruction: Secure dressing with tape as directed. [name], [name] ([id number]) [id number] Page 8 of 8 Compression Wrap Compression  Stockings Add-Ons Electronic Signature(s) Signed: 10/18/2023 4:40:40 PM By: Zenaida Deed RN, BSN Entered By: Zenaida Deed on 10/18/2023 08:39:22 -------------------------------------------------------------------------------- Vitals Details Patient Name: Date of Service: [facility], [name]. 10/18/2023 11:15 A M Medical Record Number: [id number] Patient Account Number: [id number] Date of Birth/Sex: Treating RN: 08-Jan-1961 (62 y.o. [name] Aubrianna Orchard: Sharon Seller Other Clinician: Referring Darleen Moffitt: Treating Enzio Buchler/Extender: Charise Killian in Treatment: 2 Vital Signs Time Taken: 11:29 Temperature (F): 98.6 Height (in): 61 Pulse (bpm): 67 Weight (lbs): 283 Respiratory Rate (breaths/min): 20 Body Mass Index (BMI): 53.5 Blood Pressure (mmHg): 156/76 Capillary Blood Glucose (mg/dl): 782 Reference Range: 80 - 120 mg / dl Notes glucose per pt report this morning Electronic Signature(s) Signed: 10/18/2023 4:40:40 PM By: Zenaida Deed RN, BSN Entered By: Zenaida Deed on 10/18/2023 08:30:17

## 2023-10-20 DIAGNOSIS — G5603 Carpal tunnel syndrome, bilateral upper limbs: Secondary | ICD-10-CM | POA: Diagnosis not present

## 2023-10-21 ENCOUNTER — Encounter (HOSPITAL_COMMUNITY): Payer: Self-pay | Admitting: Emergency Medicine

## 2023-10-21 ENCOUNTER — Other Ambulatory Visit: Payer: Self-pay

## 2023-10-21 ENCOUNTER — Emergency Department (HOSPITAL_COMMUNITY)
Admission: EM | Admit: 2023-10-21 | Discharge: 2023-10-22 | Disposition: A | Discharge status: Skilled Nursing Facility | Payer: Medicare HMO | Attending: Emergency Medicine | Admitting: Emergency Medicine

## 2023-10-21 ENCOUNTER — Emergency Department (HOSPITAL_COMMUNITY): Payer: Medicare HMO

## 2023-10-21 DIAGNOSIS — R0902 Hypoxemia: Secondary | ICD-10-CM | POA: Diagnosis not present

## 2023-10-21 DIAGNOSIS — E11621 Type 2 diabetes mellitus with foot ulcer: Secondary | ICD-10-CM | POA: Diagnosis not present

## 2023-10-21 DIAGNOSIS — E7800 Pure hypercholesterolemia, unspecified: Secondary | ICD-10-CM | POA: Diagnosis not present

## 2023-10-21 DIAGNOSIS — I959 Hypotension, unspecified: Secondary | ICD-10-CM | POA: Diagnosis not present

## 2023-10-21 DIAGNOSIS — R4182 Altered mental status, unspecified: Secondary | ICD-10-CM | POA: Insufficient documentation

## 2023-10-21 DIAGNOSIS — R4781 Slurred speech: Secondary | ICD-10-CM | POA: Diagnosis not present

## 2023-10-21 DIAGNOSIS — R531 Weakness: Secondary | ICD-10-CM | POA: Insufficient documentation

## 2023-10-21 DIAGNOSIS — E46 Unspecified protein-calorie malnutrition: Secondary | ICD-10-CM | POA: Diagnosis not present

## 2023-10-21 DIAGNOSIS — D72829 Elevated white blood cell count, unspecified: Secondary | ICD-10-CM | POA: Diagnosis not present

## 2023-10-21 DIAGNOSIS — I119 Hypertensive heart disease without heart failure: Secondary | ICD-10-CM | POA: Diagnosis not present

## 2023-10-21 DIAGNOSIS — N289 Disorder of kidney and ureter, unspecified: Secondary | ICD-10-CM | POA: Insufficient documentation

## 2023-10-21 DIAGNOSIS — N390 Urinary tract infection, site not specified: Secondary | ICD-10-CM | POA: Diagnosis not present

## 2023-10-21 DIAGNOSIS — R0989 Other specified symptoms and signs involving the circulatory and respiratory systems: Secondary | ICD-10-CM | POA: Diagnosis not present

## 2023-10-21 DIAGNOSIS — I10 Essential (primary) hypertension: Secondary | ICD-10-CM | POA: Insufficient documentation

## 2023-10-21 DIAGNOSIS — E119 Type 2 diabetes mellitus without complications: Secondary | ICD-10-CM | POA: Insufficient documentation

## 2023-10-21 DIAGNOSIS — I447 Left bundle-branch block, unspecified: Secondary | ICD-10-CM | POA: Diagnosis not present

## 2023-10-21 DIAGNOSIS — I517 Cardiomegaly: Secondary | ICD-10-CM | POA: Diagnosis not present

## 2023-10-21 DIAGNOSIS — Z794 Long term (current) use of insulin: Secondary | ICD-10-CM | POA: Diagnosis not present

## 2023-10-21 DIAGNOSIS — R918 Other nonspecific abnormal finding of lung field: Secondary | ICD-10-CM | POA: Diagnosis not present

## 2023-10-21 DIAGNOSIS — R739 Hyperglycemia, unspecified: Secondary | ICD-10-CM | POA: Diagnosis not present

## 2023-10-21 DIAGNOSIS — Z79899 Other long term (current) drug therapy: Secondary | ICD-10-CM | POA: Diagnosis not present

## 2023-10-21 DIAGNOSIS — D649 Anemia, unspecified: Secondary | ICD-10-CM | POA: Diagnosis not present

## 2023-10-21 LAB — COMPREHENSIVE METABOLIC PANEL
ALT: 10 U/L (ref 0–44)
AST: 12 U/L — ABNORMAL LOW (ref 15–41)
Albumin: 2.6 g/dL — ABNORMAL LOW (ref 3.5–5.0)
Alkaline Phosphatase: 77 U/L (ref 38–126)
Anion gap: 7 (ref 5–15)
BUN: 43 mg/dL — ABNORMAL HIGH (ref 8–23)
CO2: 31 mmol/L (ref 22–32)
Calcium: 9.2 mg/dL (ref 8.9–10.3)
Chloride: 97 mmol/L — ABNORMAL LOW (ref 98–111)
Creatinine, Ser: 2.37 mg/dL — ABNORMAL HIGH (ref 0.44–1.00)
GFR, Estimated: 23 mL/min — ABNORMAL LOW (ref >60)
Glucose, Bld: 272 mg/dL — ABNORMAL HIGH (ref 70–99)
Potassium: 4.8 mmol/L (ref 3.5–5.1)
Sodium: 135 mmol/L (ref 135–145)
Total Bilirubin: 0.5 mg/dL (ref <1.2)
Total Protein: 7.2 g/dL (ref 6.5–8.1)

## 2023-10-21 LAB — PROTIME-INR
INR: 1.1 (ref 0.8–1.2)
Prothrombin Time: 14.3 s (ref 11.4–15.2)

## 2023-10-21 LAB — DIFFERENTIAL
Abs Immature Granulocytes: 0.04 10*3/uL (ref 0.00–0.07)
Basophils Absolute: 0.1 10*3/uL (ref 0.0–0.1)
Basophils Relative: 1 %
Eosinophils Absolute: 0.2 10*3/uL (ref 0.0–0.5)
Eosinophils Relative: 2 %
Immature Granulocytes: 0 %
Lymphocytes Relative: 14 %
Lymphs Abs: 1.5 10*3/uL (ref 0.7–4.0)
Monocytes Absolute: 0.8 10*3/uL (ref 0.1–1.0)
Monocytes Relative: 7 %
Neutro Abs: 8.2 10*3/uL — ABNORMAL HIGH (ref 1.7–7.7)
Neutrophils Relative %: 76 %

## 2023-10-21 LAB — BLOOD GAS, ARTERIAL
Acid-Base Excess: 7.6 mmol/L — ABNORMAL HIGH (ref 0.0–2.0)
Bicarbonate: 34.3 mmol/L — ABNORMAL HIGH (ref 20.0–28.0)
Drawn by: 22223
O2 Saturation: 96 %
Patient temperature: 37
pCO2 arterial: 58 mm[Hg] — ABNORMAL HIGH (ref 32–48)
pH, Arterial: 7.38 (ref 7.35–7.45)
pO2, Arterial: 82 mm[Hg] — ABNORMAL LOW (ref 83–108)

## 2023-10-21 LAB — AMMONIA: Ammonia: 37 umol/L — ABNORMAL HIGH (ref 9–35)

## 2023-10-21 LAB — URINALYSIS, ROUTINE W REFLEX MICROSCOPIC
Bilirubin Urine: NEGATIVE
Glucose, UA: 150 mg/dL — AB
Hgb urine dipstick: NEGATIVE
Ketones, ur: NEGATIVE mg/dL
Leukocytes,Ua: NEGATIVE
Nitrite: NEGATIVE
Protein, ur: 100 mg/dL — AB
Specific Gravity, Urine: 1.011 (ref 1.005–1.030)
pH: 5 (ref 5.0–8.0)

## 2023-10-21 LAB — ETHANOL: Alcohol, Ethyl (B): <10 mg/dL (ref <10)

## 2023-10-21 LAB — CBC
HCT: 35.3 % — ABNORMAL LOW (ref 36.0–46.0)
Hemoglobin: 11.1 g/dL — ABNORMAL LOW (ref 12.0–15.0)
MCH: 26.9 pg (ref 26.0–34.0)
MCHC: 31.4 g/dL (ref 30.0–36.0)
MCV: 85.5 fL (ref 80.0–100.0)
Platelets: 307 10*3/uL (ref 150–400)
RBC: 4.13 MIL/uL (ref 3.87–5.11)
RDW: 14.5 % (ref 11.5–15.5)
WBC: 10.7 10*3/uL — ABNORMAL HIGH (ref 4.0–10.5)
nRBC: 0 % (ref 0.0–0.2)

## 2023-10-21 LAB — APTT: aPTT: 30 s (ref 24–36)

## 2023-10-21 MED ORDER — LEVOFLOXACIN 500 MG PO TABS: 500.0000 mg | ORAL_TABLET | Freq: Every day | ORAL | 0 refills | Status: DC

## 2023-10-21 MED ORDER — LEVOFLOXACIN 500 MG PO TABS
500.0000 mg | ORAL_TABLET | Freq: Once | ORAL | Status: AC
Start: 2023-10-21 — End: 2023-10-21
  Administered 2023-10-21: 500 mg via ORAL
  Filled 2023-10-21: qty 1

## 2023-10-21 MED ORDER — SODIUM CHLORIDE 0.9 % IV BOLUS
500.0000 mL | Freq: Once | INTRAVENOUS | Status: AC
  Administered 2023-10-21: 500 mL via INTRAVENOUS

## 2023-10-21 NOTE — ED Provider Notes (Signed)
[place] EMERGENCY DEPARTMENT AT [facility] Provider Note   CSN: [id number] Arrival date & time: 10/21/23  1907     History  Chief Complaint  Patient presents with   Weakness    [name] is a 62 y.o. female.   Weakness  This patient is a 62 year old female, she has a history of multiple medical problems and lives at [facility] nursing home where she has been for 2 years.  She has hypertension, high cholesterol, diabetes on insulin and is currently being treated for a right lower extremity foot ulcer.  The patient reports to me that she was told that she had some slurred speech and was not being her normal self which is why she was sent to the hospital.  The report from the paramedics was that she had had an issue where she was fumbling with her phone and was having some confusion, that seems to have resolved in the NIH scale for paramedics and for myself was 0.  The patient is able to answer my questions appropriately.    Home Medications Prior to Admission medications   Medication Sig Start Date End Date Taking? Authorizing Provider  acetaminophen (TYLENOL) 650 MG CR tablet Take 650 mg by mouth every 8 (eight) hours as needed for pain.   Yes [provider]  albuterol (VENTOLIN HFA) 108 (90 Base) MCG/ACT inhaler Inhale 2 puffs into the lungs every 6 (six) hours as needed for wheezing or shortness of breath.   Yes [provider]  alum & mag hydroxide-simeth (MAALOX/MYLANTA) 200-200-20 MG/5ML suspension Take 30 mLs by mouth every 2 (two) hours as needed for indigestion or heartburn. Do not exceed 4 doses in 24 hours   Yes [provider]  Amino Acids-Protein Hydrolys (FEEDING SUPPLEMENT, PRO-STAT 64,) LIQD Take 30 mLs by mouth daily.   Yes [provider]  amLODipine (NORVASC) 5 MG tablet Take 10 mg by mouth daily. 03/30/23  Yes [provider]  ascorbic acid (VITAMIN C) 500 MG tablet Take 500 mg by mouth daily.    Yes [provider]  aspirin EC 81 MG tablet Take 81 mg by mouth daily. Swallow whole.   Yes [provider]  atorvastatin (LIPITOR) 10 MG tablet Take 10 mg by mouth at bedtime. 09/29/21  Yes [provider]  Biotin 10 MG TABS Take 1 tablet by mouth daily.   Yes [provider]  cetirizine (ZYRTEC) 10 MG tablet Take 10 mg by mouth daily.   Yes [provider]  Cholecalciferol (VITAMIN D) 50 MCG (2000 UT) CAPS Take 2,000 Units by mouth daily.   Yes [provider]  Cranberry 600 MG TABS Take 300 mg by mouth 2 (two) times daily. Half tab bid   Yes [provider]  docusate sodium (COLACE) 100 MG capsule Take 100 mg by mouth daily.   Yes [provider]  Emollient (EUCERIN) lotion Apply 1 Application topically every 12 (twelve) hours as needed for dry skin.   Yes [provider]  estradiol (ESTRACE) 0.1 MG/GM vaginal cream Discard plastic applicator. Insert a blueberry size amount (approximately 1 gram) of cream on fingertip inside vagina at bedtime every night for 1 week then every other night routinely (for long term use). 09/09/23  Yes Donnita Falls, FNP  ferrous sulfate 325 (65 FE) MG tablet Take 325 mg by mouth daily.   Yes [provider]  fluticasone furoate-vilanterol (BREO ELLIPTA) 100-25 MCG/ACT AEPB Inhale 1 puff into the  lungs daily. 07/02/23  Yes Emokpae, Courage, MD  furosemide (LASIX) 20 MG tablet Take 20 mg by mouth daily. 03/29/22  Yes [provider]  gabapentin (NEURONTIN) 300 MG capsule Take 1 capsule in AM, 1 capsule in PM 09/30/23  Yes Van Clines, MD  Glucerna [facility]) LIQD Take 237 mLs by mouth daily. For wound healing   Yes [provider]  hydrALAZINE (APRESOLINE) 25 MG tablet Take 1 tablet (25 mg total) by mouth every 8 (eight) hours. 11/26/21  Yes Johnson, Clanford L, MD  ipratropium-albuterol (DUONEB) 0.5-2.5 (3) MG/3ML SOLN Take 3 mLs by nebulization every 4  (four) hours as needed. 08/12/23  Yes [provider]  LANTUS 100 UNIT/ML injection Inject 0.5 mLs (50 Units total) into the skin at bedtime. 07/11/23  Yes Dani Gobble, NP  levofloxacin (LEVAQUIN) 500 MG tablet Take 1 tablet (500 mg total) by mouth daily. 10/21/23  Yes Eber Hong, MD  levothyroxine (SYNTHROID) 88 MCG tablet Take 88 mcg by mouth daily before breakfast.   Yes [provider]  Multiple Vitamins-Minerals (MULTIVITAMIN WITH MINERALS) tablet Take 1 tablet by mouth daily.   Yes [provider]  MYRBETRIQ 50 MG TB24 tablet Take 1 tablet (50 mg total) by mouth daily. 08/04/23  Yes Donnita Falls, FNP  NOVOLOG 100 UNIT/ML injection Inject 10-16 Units into the skin 3 (three) times daily with meals. 90-150= 10 units; 151-200= 11 units; 201-250= 12 units; 251-300= 13 units; 301-350= 14 units; 351-400= 15 units; above 400 = 16 units 07/11/23  Yes Reardon, Alphonzo Lemmings J, NP  nystatin cream (MYCOSTATIN) Apply 1 Application topically 2 (two) times daily. 07/05/23  Yes [provider]  omeprazole (PRILOSEC) 20 MG capsule Take 20 mg by mouth daily.   Yes [provider]  ondansetron (ZOFRAN) 4 MG tablet Take 8 mg by mouth 2 (two) times daily. 03/30/23  Yes [provider]  potassium chloride SA (K-DUR) 20 MEQ tablet Take 20 mEq by mouth daily.   Yes [provider]  propranolol (INDERAL) 80 MG tablet Take 80 mg by mouth daily.   Yes [provider]  saccharomyces boulardii (FLORASTOR) 250 MG capsule Take 250 mg by mouth 2 (two) times daily.   Yes [provider]  sertraline (ZOLOFT) 100 MG tablet Take 100 mg by mouth daily. 03/31/23  Yes [provider]  trimethoprim (TRIMPEX) 100 MG tablet Take 1 tablet (100 mg total) by mouth daily. 09/09/23  Yes Donnita Falls, FNP  clotrimazole-betamethasone (LOTRISONE) cream Apply 1 Application topically 2 (two) times daily. 08/07/23   [provider]  lansoprazole  (PREVACID) 30 MG capsule Take 30 mg by mouth 2 (two) times daily before a meal. 02/27/23   [provider]  Menthol-Zinc Oxide (CALMOSEPTINE) 0.44-20.6 % OINT Apply 1 Application topically as needed (preservation/protection after incontinent care).    [provider]  PROCRIT 4000 UNIT/ML injection Inject 4,000 Units into the skin every Monday, Wednesday, and Friday. Hold if hemoglobin is > 10 08/01/22   [provider]  SANTYL 250 UNIT/GM ointment Apply 1 Application topically daily. 05/08/23   [provider]      Allergies    Carvedilol, Benicar [olmesartan], Codeine, Sulfa antibiotics, Trulicity [dulaglutide], and Ceftriaxone    Review of Systems   Review of Systems  Neurological:  Positive for weakness.  All other systems reviewed and are negative.   Physical Exam Updated Vital Signs BP 131/87   Pulse 62   Temp 98.9 F (37.2 C) (  Oral)   Resp 17   Ht 1.549 m (5\' 1" )   Wt 130.2 kg   LMP 08/06/2012   SpO2 95%   BMI 54.23 kg/m  Physical Exam Vitals and nursing note reviewed.  Constitutional:      General: She is not in acute distress.    Appearance: She is well-developed.  HENT:     Head: Normocephalic and atraumatic.     Mouth/Throat:     Pharynx: No oropharyngeal exudate.  Eyes:     General: No scleral icterus.       Right eye: No discharge.        Left eye: No discharge.     Conjunctiva/sclera: Conjunctivae normal.     Pupils: Pupils are equal, round, and reactive to light.  Neck:     Thyroid: No thyromegaly.     Vascular: No JVD.  Cardiovascular:     Rate and Rhythm: Normal rate and regular rhythm.     Heart sounds: Normal heart sounds. No murmur heard.    No friction rub. No gallop.  Pulmonary:     Effort: Pulmonary effort is normal. No respiratory distress.     Breath sounds: Normal breath sounds. No wheezing or rales.  Abdominal:     General: Bowel sounds are normal. There is no distension.     Palpations: Abdomen is soft.  There is no mass.     Tenderness: There is no abdominal tenderness.  Musculoskeletal:        General: No tenderness. Normal range of motion.     Cervical back: Normal range of motion and neck supple.     Right lower leg: Edema present.     Left lower leg: Edema present.     Comments: Healing ulcer to the right heel  Lymphadenopathy:     Cervical: No cervical adenopathy.  Skin:    General: Skin is warm and dry.     Findings: No erythema or rash.  Neurological:     Mental Status: She is alert.     Coordination: Coordination normal.     Comments: The patient is able to answer my questions with clear speech, she does appear mildly sleepy but has perfect answers, she follows commands with exactness and although she has some generalized diffuse weakness she is able to follow my commands.  She is able to perform finger-nose-finger, she is able to lift both legs a couple inches off the bed but states that her legs are chronically weak.  She has speech which is clear, she has no facial droop, she has a very slight asterixis of her hands but has no pronator drift or weakness of the upper extremities.  Psychiatric:        Behavior: Behavior normal.     ED Results / Procedures / Treatments   Labs (all labs ordered are listed, but only abnormal results are displayed) Labs Reviewed  CBC - Abnormal; Notable for the following components:      Result Value   WBC 10.7 (*)    Hemoglobin 11.1 (*)    HCT 35.3 (*)    All other components within normal limits  DIFFERENTIAL - Abnormal; Notable for the following components:   Neutro Abs 8.2 (*)    All other components within normal limits  COMPREHENSIVE METABOLIC PANEL - Abnormal; Notable for the following components:   Chloride 97 (*)    Glucose, Bld 272 (*)    BUN 43 (*)    Creatinine, Ser 2.37 (*)  Albumin 2.6 (*)    AST 12 (*)    GFR, Estimated 23 (*)    All other components within normal limits  URINALYSIS, ROUTINE W REFLEX MICROSCOPIC -  Abnormal; Notable for the following components:   Glucose, UA 150 (*)    Protein, ur 100 (*)    Bacteria, UA RARE (*)    All other components within normal limits  AMMONIA - Abnormal; Notable for the following components:   Ammonia 37 (*)    All other components within normal limits  BLOOD GAS, ARTERIAL - Abnormal; Notable for the following components:   pCO2 arterial 58 (*)    pO2, Arterial 82 (*)    Bicarbonate 34.3 (*)    Acid-Base Excess 7.6 (*)    All other components within normal limits  ETHANOL  PROTIME-INR  APTT    EKG EKG Interpretation Date/Time:  Friday October 21 2023 19:29:11 EST Ventricular Rate:  68 PR Interval:  139 QRS Duration:  131 QT Interval:  459 QTC Calculation: 489 R Axis:   -59  Text Interpretation: Sinus rhythm Left bundle branch block Since last tracing rate slower Confirmed by Eber Hong ([id number]) on 10/21/2023 7:31:41 PM  Radiology DG Chest Portable 1 View  Result Date: 10/21/2023 CLINICAL DATA:  Altered mental status EXAM: PORTABLE CHEST 1 VIEW COMPARISON:  07/01/2023 FINDINGS: Cardiomegaly. Mediastinal contours within normal limits. Vascular congestion. No definite overt edema. Possible layering right effusion. No acute bony abnormality. IMPRESSION: Cardiomegaly with vascular congestion. Possible layering right pleural effusion. Cardiomegaly. Electronically Signed   By: Charlett Nose M.D.   On: 10/21/2023 22:35   CT HEAD WO CONTRAST  Result Date: 10/21/2023 CLINICAL DATA:  Mental status change, unknown cause EXAM: CT HEAD WITHOUT CONTRAST TECHNIQUE: Contiguous axial images were obtained from the base of the skull through the vertex without intravenous contrast. RADIATION DOSE REDUCTION: This exam was performed according to the departmental dose-optimization program which includes automated exposure control, adjustment of the mA and/or kV according to patient size and/or use of iterative reconstruction technique. COMPARISON:  MRI head  11/13/2022. CT head 12/16/2021. FINDINGS: Brain: No evidence of acute infarction, hemorrhage, hydrocephalus, extra-axial collection or mass lesion/mass effect. Patchy white matter hypodensities are nonspecific but compatible with chronic microvascular ischemic change. Vascular: No hyperdense vessel. Skull: No acute fracture. Sinuses/Orbits: Clear sinuses.  No acute orbital findings. Other: No mastoid effusions. IMPRESSION: No evidence of acute intracranial abnormality. Electronically Signed   By: Feliberto Harts M.D.   On: 10/21/2023 20:00    Procedures Procedures    Medications Ordered in ED Medications  levofloxacin (LEVAQUIN) tablet 500 mg (has no administration in time range)  sodium chloride 0.9 % bolus 500 mL (500 mLs Intravenous New Bag/Given 10/21/23 2149)    ED Course/ Medical Decision Making/ A&P                                 Medical Decision Making Amount and/or Complexity of Data Reviewed Labs: ordered. Radiology: ordered.  Risk Prescription drug management.    This patient presents to the ED for concern of weakness and confusion prehospital, this involves an extensive number of treatment options, and is a complaint that carries with it a high risk of complications and morbidity.  The differential diagnosis includes electrolyte abnormalities, consider elevated ammonia, consider liver deficits, nutritional deficits, stroke, seizure, drug side effects or polypharmacy.  Will also consider hypercapnea   Co morbidities that complicate the  patient evaluation  Hypertension, diabetes, chronically malnourished   Additional history obtained:  Additional history obtained from medical record and paramedics External records from outside source obtained and reviewed including admission for acute respiratory failure in July   Lab Tests:  I Ordered, and personally interpreted labs.  The pertinent results include: Urinalysis without acute findings, ABG shows no acidosis, CO2  of 58 which is at the patient's baseline, pO2 of 82 and an oxygen saturation of 96%.  Alcohol undetectable, metabolic panel without acute findings other than mild renal insufficiency compared to baseline, creatinine of 2.37, it was 1.8 approximately 2 months ago.  Urinalysis without findings, ammonia of 37   Imaging Studies ordered:  I ordered imaging studies including CT scan of the brain without acute findings, chest x-ray also obtained I independently visualized and interpreted imaging which showed possible effusion, some cardiomegaly, no obvious infiltrates I agree with the radiologist interpretation   Cardiac Monitoring: / EKG:  The patient was maintained on a cardiac monitor.  I personally viewed and interpreted the cardiac monitored which showed an underlying rhythm of: Normal sinus rhythm, no hypotension no hypoxia and no tachycardia   Problem List / ED Course / Critical interventions / Medication management  Overall this patient does not appear to be in any distress, she is resting and sleeping however she is easily arousable and answers my questions appropriately.  She had evidently received her nighttime medications before being transported this evening. I ordered medication including Levaquin for possible pneumonia Reevaluation of the patient after these medicines showed that the patient patient stable I have reviewed the patients home medicines and have made adjustments as needed   Social Determinants of Health:  In a nursing facility, I did unwrap her right leg and looked at the wounds on her foot, they are superficial, there is no surrounding cellulitis, no foul smell or drainage   Test / Admission - Considered:  Considered admission but the patient is in a nursing facility where she has close oversight by nursing, I have recommended they repeat labs, she was given hydration and antibiotics here, she will go home on Levaquin for 1 week, she does not have any signs of  stroke clinically, she is mildly somnolent but I received her nighttime medications, when she is around she seems to be at her baseline.         Final Clinical Impression(s) / ED Diagnoses Final diagnoses:  Generalized weakness  Renal insufficiency    Rx / DC Orders ED Discharge Orders          Ordered    levofloxacin (LEVAQUIN) 500 MG tablet  Daily        10/21/23 2304              Eber Hong, MD 10/21/23 2310

## 2023-10-21 NOTE — ED Notes (Signed)
Patient transported to CT 

## 2023-10-21 NOTE — Discharge Instructions (Addendum)
The x-ray appears to have a small amount of fluid on one of your lungs, this could be a pneumonia, because of this I want you to start taking Levaquin, take this once a day for 7 days, we gave you the first dose tonight, the next dose will be tomorrow night.  Your kidney test will need to be rechecked within 1 week, your creatinine was 2.3, this is a little higher than usual and suggest that you may be a little dehydrated.  Please make sure that you are drinking plenty of clear liquids.  Thank you for allowing [place] to treat you in the emergency department today.  After reviewing your examination and potential testing that was done it appears that you are safe to go home.  I would like for you to follow-up with your doctor within the next several days, have them obtain your records and follow-up with them to review all potential tests and results from your visit.  If you should develop severe or worsening symptoms return to the emergency department immediately

## 2023-10-21 NOTE — ED Triage Notes (Signed)
Pt BIB [facility] from [facility]. Facility staff sent pt out due to fumbling with her cell phone while trying to use it about 3 hrs ago. Pt denies any complaints or weakness. Stroke scale negative for EMS.

## 2023-10-22 DIAGNOSIS — I959 Hypotension, unspecified: Secondary | ICD-10-CM | POA: Diagnosis not present

## 2023-10-22 DIAGNOSIS — R739 Hyperglycemia, unspecified: Secondary | ICD-10-CM | POA: Diagnosis not present

## 2023-10-22 DIAGNOSIS — Z743 Need for continuous supervision: Secondary | ICD-10-CM | POA: Diagnosis not present

## 2023-10-24 DIAGNOSIS — Z79899 Other long term (current) drug therapy: Secondary | ICD-10-CM | POA: Diagnosis not present

## 2023-10-24 DIAGNOSIS — N289 Disorder of kidney and ureter, unspecified: Secondary | ICD-10-CM | POA: Diagnosis not present

## 2023-10-24 DIAGNOSIS — N390 Urinary tract infection, site not specified: Secondary | ICD-10-CM | POA: Diagnosis not present

## 2023-10-24 DIAGNOSIS — R4182 Altered mental status, unspecified: Secondary | ICD-10-CM | POA: Diagnosis not present

## 2023-10-24 DIAGNOSIS — J189 Pneumonia, unspecified organism: Secondary | ICD-10-CM | POA: Diagnosis not present

## 2023-10-26 DIAGNOSIS — Z87440 Personal history of urinary (tract) infections: Secondary | ICD-10-CM | POA: Diagnosis not present

## 2023-10-26 DIAGNOSIS — I5033 Acute on chronic diastolic (congestive) heart failure: Secondary | ICD-10-CM | POA: Diagnosis not present

## 2023-10-26 DIAGNOSIS — J449 Chronic obstructive pulmonary disease, unspecified: Secondary | ICD-10-CM | POA: Diagnosis not present

## 2023-10-26 DIAGNOSIS — E1165 Type 2 diabetes mellitus with hyperglycemia: Secondary | ICD-10-CM | POA: Diagnosis not present

## 2023-10-26 DIAGNOSIS — J189 Pneumonia, unspecified organism: Secondary | ICD-10-CM | POA: Diagnosis not present

## 2023-10-27 DIAGNOSIS — R059 Cough, unspecified: Secondary | ICD-10-CM | POA: Diagnosis not present

## 2023-10-27 DIAGNOSIS — J189 Pneumonia, unspecified organism: Secondary | ICD-10-CM | POA: Diagnosis not present

## 2023-10-27 DIAGNOSIS — R531 Weakness: Secondary | ICD-10-CM | POA: Diagnosis not present

## 2023-10-27 DIAGNOSIS — R062 Wheezing: Secondary | ICD-10-CM | POA: Diagnosis not present

## 2023-10-27 DIAGNOSIS — R0989 Other specified symptoms and signs involving the circulatory and respiratory systems: Secondary | ICD-10-CM | POA: Diagnosis not present

## 2023-10-27 DIAGNOSIS — Z79899 Other long term (current) drug therapy: Secondary | ICD-10-CM | POA: Diagnosis not present

## 2023-10-28 DIAGNOSIS — Z79899 Other long term (current) drug therapy: Secondary | ICD-10-CM | POA: Diagnosis not present

## 2023-10-28 DIAGNOSIS — E1165 Type 2 diabetes mellitus with hyperglycemia: Secondary | ICD-10-CM | POA: Diagnosis not present

## 2023-10-29 DIAGNOSIS — I130 Hypertensive heart and chronic kidney disease with heart failure and stage 1 through stage 4 chronic kidney disease, or unspecified chronic kidney disease: Secondary | ICD-10-CM | POA: Diagnosis not present

## 2023-10-31 ENCOUNTER — Encounter (HOSPITAL_BASED_OUTPATIENT_CLINIC_OR_DEPARTMENT_OTHER): Payer: Medicare HMO | Admitting: General Surgery

## 2023-10-31 DIAGNOSIS — E10621 Type 1 diabetes mellitus with foot ulcer: Secondary | ICD-10-CM | POA: Diagnosis not present

## 2023-10-31 DIAGNOSIS — I872 Venous insufficiency (chronic) (peripheral): Secondary | ICD-10-CM | POA: Diagnosis not present

## 2023-10-31 DIAGNOSIS — L97412 Non-pressure chronic ulcer of right heel and midfoot with fat layer exposed: Secondary | ICD-10-CM | POA: Diagnosis not present

## 2023-10-31 DIAGNOSIS — E1040 Type 1 diabetes mellitus with diabetic neuropathy, unspecified: Secondary | ICD-10-CM | POA: Diagnosis not present

## 2023-10-31 DIAGNOSIS — M1990 Unspecified osteoarthritis, unspecified site: Secondary | ICD-10-CM | POA: Diagnosis not present

## 2023-10-31 DIAGNOSIS — I5032 Chronic diastolic (congestive) heart failure: Secondary | ICD-10-CM | POA: Diagnosis not present

## 2023-10-31 DIAGNOSIS — E11621 Type 2 diabetes mellitus with foot ulcer: Secondary | ICD-10-CM | POA: Diagnosis not present

## 2023-10-31 DIAGNOSIS — L304 Erythema intertrigo: Secondary | ICD-10-CM | POA: Diagnosis not present

## 2023-10-31 DIAGNOSIS — I890 Lymphedema, not elsewhere classified: Secondary | ICD-10-CM | POA: Diagnosis not present

## 2023-10-31 DIAGNOSIS — I110 Hypertensive heart disease with heart failure: Secondary | ICD-10-CM | POA: Diagnosis not present

## 2023-10-31 NOTE — Progress Notes (Signed)
[name], [name] ([id number]) [id number] Page 1 of 8 Visit Report for 10/31/2023 Arrival Information Details Patient Name: Date of Service: [name]. 10/31/2023 11:15 A M Medical Record Number: [id number] Patient Account Number: [id number] Date of Birth/Sex: Treating RN: [birth year] (62 y.o. F) Primary Care Katelynne Revak: Sharon Seller Other Clinician: Referring Willer Osorno: Treating Shirla Hodgkiss/Extender: Charise Killian in Treatment: 3 Visit Information History Since Last Visit Added or deleted any medications: No Patient Arrived: Wheel Chair Any new allergies or adverse reactions: No Arrival Time: 11:21 Had a fall or experienced change in No Accompanied By: husband activities of daily living that may affect Transfer Assistance: None risk of falls: Patient Identification Verified: Yes Signs or symptoms of abuse/neglect since last visito No Secondary Verification Process Completed: Yes Hospitalized since last visit: No Patient Requires Transmission-Based Precautions: No Implantable device outside of the clinic excluding No Patient Has Alerts: Yes cellular tissue based products placed in the center Patient Alerts: R ABI N/C since last visit: Pain Present Now: No Electronic Signature(s) Signed: 10/31/2023 2:57:56 PM By: Dayton Scrape Entered By: Dayton Scrape on 10/31/2023 11:21:46 -------------------------------------------------------------------------------- Encounter Discharge Information Details Patient Name: Date of Service: [name]. 10/31/2023 11:15 A M Medical Record Number: [id number] Patient Account Number: [id number] Date of Birth/Sex: Treating RN: 09-13-1961 (62 y.o. [name], [name] Primary Care Faustino Luecke: Sharon Seller Other Clinician: Referring Annjanette Wertenberger: Treating Kadra Kohan/Extender: Charise Killian in Treatment: 3 Encounter Discharge Information Items Post  Procedure Vitals Discharge Condition: Stable Temperature (F): 97.7 Ambulatory Status: Wheelchair Pulse (bpm): 92 Discharge Destination: Skilled Nursing Facility Respiratory Rate (breaths/min): 20 Telephoned: No Blood Pressure (mmHg): 131/66 Orders Sent: Yes Transportation: Private Auto Accompanied By: spouse Schedule Follow-up Appointment: Yes Clinical Summary of Care: Electronic Signature(s) Signed: 10/31/2023 3:46:39 PM By: Shawn Stall RN, BSN Entered By: Shawn Stall on 10/31/2023 11:44:14 [name] ([id number]) [id number] Page 2 of 8 -------------------------------------------------------------------------------- Lower Extremity Assessment Details Patient Name: Date of Service: [name]. 10/31/2023 11:15 A M Medical Record Number: [id number] Patient Account Number: [id number] Date of Birth/Sex: Treating RN: [birth year] (62 y.o. [name], [name] Primary Care Daneil Beem: Sharon Seller Other Clinician: Referring Dahlila Pfahler: Treating Mahagony Grieb/Extender: Charise Killian in Treatment: 3 Edema Assessment Assessed: [Left: No] [Right: Yes] Edema: [Left: Ye] [Right: s] Calf Left: Right: Point of Measurement: From Medial Instep 41 cm Ankle Left: Right: Point of Measurement: From Medial Instep 23 cm Vascular Assessment Pulses: Dorsalis Pedis Palpable: [Right:Yes] Extremity colors, hair growth, and conditions: Extremity Color: [Right:Hyperpigmented] Hair Growth on Extremity: [Right:No] Temperature of Extremity: [Right:Warm] Capillary Refill: [Right:< 3 seconds] Dependent Rubor: [Right:No] Blanched when Elevated: [Right:No No] Toe Nail Assessment Left: Right: Thick: Yes Discolored: Yes Deformed: Yes Improper Length and Hygiene: No Electronic Signature(s) Signed: 10/31/2023 3:46:39 PM By: Shawn Stall RN, BSN Entered By: Shawn Stall on 10/31/2023  11:28:59 -------------------------------------------------------------------------------- Multi Wound Chart Details Patient Name: Date of Service: [name]. 10/31/2023 11:15 A M Medical Record Number: [id number] Patient Account Number: [id number] Date of Birth/Sex: Treating RN: 29-May-1961 (62 y.o. 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 Etiology: Extremity Cataracts, Anemia, Asthma, Chronic N/A N/A Comorbid History: Obstructive Pulmonary Disease (COPD), Sleep Apnea, Congestive Heart Failure, Coronary Artery Disease, Hypertension, Peripheral Venous Disease, Type II Diabetes, Osteoarthritis, Neuropathy, Confinement Anxiety 07/07/2023 N/A N/A Date Acquired: 3 N/A N/A Weeks of Treatment: Open N/A N/A Wound Status: No N/A N/A Wound Recurrence: Yes N/A N/A Clustered Wound: 2 N/A N/A Clustered Quantity: 2.2x5x0.1 N/A N/A Measurements L x W x D (cm) 8.639 N/A N/A A (cm) : rea 0.864 N/A N/A Volume (cm) : 62.20% N/A N/A % Reduction in A rea: 62.20% N/A N/A % Reduction in Volume: Grade 2 N/A N/A Classification: Medium N/A N/A Exudate A mount: Serosanguineous N/A N/A Exudate Type: red, brown N/A N/A Exudate Color: Flat and Intact N/A N/A Wound Margin: Large (67-100%) N/A N/A Granulation A mount: Pink, Pale N/A N/A Granulation Quality: Small (1-33%) N/A N/A Necrotic A mount: Fat Layer  (Subcutaneous Tissue): Yes N/A N/A Exposed Structures: Fascia: No Tendon: No Muscle: No Joint: No Bone: No Small (1-33%) N/A N/A Epithelialization: Debridement - Excisional N/A N/A Debridement: Pre-procedure Verification/Time Out 11:30 N/A N/A Taken: Lidocaine 4% Topical Solution N/A N/A Pain Control: Callus, Subcutaneous, Slough N/A N/A Tissue Debrided: Skin/Subcutaneous Tissue N/A N/A Level: 7.77 N/A N/A Debridement A (sq cm): rea Curette N/A N/A Instrument: Moderate N/A N/A Bleeding: Pressure N/A N/A Hemostasis A chieved: 0 N/A N/A Procedural Pain: 0 N/A N/A Post Procedural Pain: Procedure was tolerated well N/A N/A Debridement Treatment Response: 2.2x5x0.1 N/A N/A Post Debridement Measurements L x W x D (cm) 0.864 N/A N/A Post Debridement Volume: (cm) Callus: Yes N/A N/A Periwound Skin Texture: Excoriation: No Induration: No Crepitus: No Rash: No Scarring: No Dry/Scaly: Yes N/A N/A Periwound Skin Moisture: Maceration: No Atrophie Blanche: No N/A N/A Periwound Skin Color: Cyanosis: No Ecchymosis: No Erythema: No Hemosiderin Staining: No Mottled: No Pallor: No [name], [name] ([id number]) [id number] Page 4 of 8 Rubor: No No Abnormality N/A N/A Temperature: Yes N/A N/A Tenderness on Palpation: Debridement N/A N/A Procedures Performed: Treatment Notes Wound #3 (Calcaneus) Wound Laterality: Right Cleanser Wound Cleanser Discharge Instruction: Cleanse the wound with wound cleanser prior to applying a clean dressing using gauze sponges, not tissue or cotton balls. Peri-Wound Care Topical Primary Dressing Maxorb Extra Ag+ Alginate Dressing, 4x4.75 (in/in) Discharge Instruction: Apply to wound bed as instructed Secondary Dressing ALLEVYN Heel 4 1/2in x 5 1/2in / 10.5cm x 13.5cm Discharge Instruction: Apply over primary dressing as directed. Woven Gauze Sponge, Non-Sterile 4x4 in Discharge Instruction: Apply over  primary dressing as directed. Secured With [employer], 4.5x3.1 (in/yd) Discharge Instruction: Secure with Kerlix as directed. Transpore Teresa Tape, 2x10 (in/yd) Discharge Instruction: Secure dressing with tape as directed. Compression Wrap Compression Stockings Add-Ons Electronic Signature(s) Signed: 10/31/2023 12:07:30 PM By: Duanne Guess MD FACS Entered By: Duanne Guess on 10/31/2023 12:07:30 -------------------------------------------------------------------------------- Multi-Disciplinary Care Plan Details Patient Name: Date of Service: [facility] [place], [place] [name]. 10/31/2023 11:15 A M Medical Record Number: [id number] Patient Account Number: [id number] Date of Birth/Sex: Treating RN: [birth year] (62 y.o. Arta Silence Primary Care Greogry Goodwyn: Sharon Seller Other Clinician: Referring Alec Mcphee: Treating Hawley Pavia/Extender: Charise Killian in Treatment: 3 Multidisciplinary Care Plan reviewed with physician Active Inactive Nutrition Nursing Diagnoses: Impaired glucose control: actual or potential Potential for alteratiion in Nutrition/Potential for imbalanced nutrition Goals: Patient/caregiver will maintain therapeutic glucose control Date Initiated: 10/04/2023 Target Resolution Date: 12/02/2023 [name], [name] ([id number]) [phone].pdf Page 5 of 8 Goal  Status: Active Interventions: Provide education on elevated blood sugars and impact on wound healing Treatment Activities: Patient referred to Primary Care Physician for further nutritional evaluation : 10/04/2023 Notes: Wound/Skin Impairment Nursing Diagnoses: Impaired tissue integrity Knowledge deficit related to ulceration/compromised skin integrity Goals: Patient/caregiver will verbalize understanding of skin care regimen Date Initiated: 10/04/2023 Target Resolution Date: 12/02/2023 Goal Status: Active Ulcer/skin breakdown will have a  volume reduction of 30% by week 4 Date Initiated: 10/04/2023 Target Resolution Date: 11/10/2023 Goal Status: Active Interventions: Assess patient/caregiver ability to obtain necessary supplies Assess patient/caregiver ability to perform ulcer/skin care regimen upon admission and as needed Assess ulceration(s) every visit Provide education on ulcer and skin care Treatment Activities: Skin care regimen initiated : 10/04/2023 Topical wound management initiated : 10/04/2023 Notes: Electronic Signature(s) Signed: 10/31/2023 3:46:39 PM By: Shawn Stall RN, BSN Entered By: Shawn Stall on 10/31/2023 11:26:20 -------------------------------------------------------------------------------- Pain Assessment Details Patient Name: Date of Service: [facility], [place] [name]. 10/31/2023 11:15 A M Medical Record Number: [id number] Patient Account Number: [id number] Date of Birth/Sex: Treating RN: 09/10/1961 (62 y.o. F) Primary Care Jazzelle Zhang: Sharon Seller Other Clinician: Referring Ahlani Wickes: Treating Baylen Dea/Extender: Charise Killian in Treatment: 3 Active Problems Location of Pain Severity and Description of Pain Patient Has Paino No Site Locations [place], [place] [name] ([id number]) [id number] Page 6 of 8 Pain Management and Medication Current Pain Management: Electronic Signature(s) Signed: 10/31/2023 2:57:56 PM By: Dayton Scrape Entered By: Dayton Scrape on 10/31/2023 11:22:18 -------------------------------------------------------------------------------- Patient/Caregiver Education Details Patient Name: Date of Service: [facility] [address], [place] [name]. 11/25/2024andnbsp11:15 A M Medical Record Number: [id number] Patient Account Number: [id number] Date of Birth/Gender: Treating RN: 1961-12-06 (62 y.o. Arta Silence Primary Care Physician: Sharon Seller Other Clinician: Referring Physician: Treating Physician/Extender: Charise Killian in Treatment: 3 Education Assessment Education Provided To: Patient Education Topics Provided Offloading: Handouts: How Offloading Helps Foot Wounds Heal Methods: Explain/Verbal Responses: Reinforcements needed Electronic Signature(s) Signed: 10/31/2023 3:46:39 PM By: Shawn Stall RN, BSN Entered By: Shawn Stall on 10/31/2023 11:26:32 -------------------------------------------------------------------------------- Wound Assessment Details Patient Name: Date of Service: [facility], [place] [name]. 10/31/2023 11:15 A M Medical Record Number: [id number] Patient Account Number: [id number] Date of Birth/Sex: Treating RN: [birth year] (62 y.o. F) Primary Care Willy Vorce: Sharon Seller Other Clinician: RYIAH, [name] ([id number]) [id number] Page 7 of 8 Referring Niquan Charnley: Treating Neveen Daponte/Extender: Charise Killian in Treatment: 3 Wound Status Wound Number: 3 Primary Diabetic Wound/Ulcer of the Lower Extremity Etiology: Wound Location: Right Calcaneus Wound Open Wounding Event: Blister Status: Date Acquired: 07/07/2023 Comorbid Cataracts, Anemia, Asthma, Chronic Obstructive Pulmonary Weeks Of Treatment: 3 History: Disease (COPD), Sleep Apnea, Congestive Heart Failure, Coronary Clustered Wound: Yes Artery Disease, Hypertension, Peripheral Venous Disease, Type II Diabetes, Osteoarthritis, Neuropathy, Confinement Anxiety Photos Wound Measurements Length: (cm) Width: (cm) Depth: (cm) Clustered Quantity: Area: (cm) Volume: (cm) 2.2 % Reduction in Area: 62.2% 5 % Reduction in Volume: 62.2% 0.1 Epithelialization: Small (1-33%) 2 Tunneling: No 8.639 Undermining: No 0.864 Wound Description Classification: Grade 2 Wound Margin: Flat and Intact Exudate Amount: Medium Exudate Type: Serosanguineous Exudate Color: red, brown Foul Odor After Cleansing: No Slough/Fibrino Yes Wound  Bed Granulation Amount: Large (67-100%) Exposed Structure Granulation Quality: Pink, Pale Fascia Exposed: No Necrotic Amount: Small (1-33%) Fat Layer (Subcutaneous Tissue) Exposed: Yes Necrotic Quality: Adherent Slough Tendon Exposed: No Muscle Exposed: No Joint Exposed: No Bone Exposed: No Periwound Skin Texture Texture Color No Abnormalities Noted: No No Abnormalities Noted: No Callus:  Yes Atrophie Blanche: No Crepitus: No Cyanosis: No Excoriation: No Ecchymosis: No Induration: No Erythema: No Rash: No Hemosiderin Staining: No Scarring: No Mottled: No Pallor: No Moisture Rubor: No No Abnormalities Noted: No Dry / Scaly: Yes Temperature / Pain Maceration: No Temperature: No Abnormality Tenderness on Palpation: Yes Treatment Notes Wound #3 (Calcaneus) Wound Laterality: Right Cleanser Wound Cleanser Discharge Instruction: Cleanse the wound with wound cleanser prior to applying a clean dressing using gauze sponges, not tissue or cotton balls. [name], [name] ([id number]) [id number] Page 8 of 8 Peri-Wound Care Topical Primary Dressing Maxorb Extra Ag+ Alginate Dressing, 4x4.75 (in/in) Discharge Instruction: Apply to wound bed as instructed Secondary Dressing ALLEVYN Heel 4 1/2in x 5 1/2in / 10.5cm x 13.5cm Discharge Instruction: Apply over primary dressing as directed. Woven Gauze Sponge, Non-Sterile 4x4 in Discharge Instruction: Apply over primary dressing as directed. Secured With [employer], 4.5x3.1 (in/yd) Discharge Instruction: Secure with Kerlix as directed. Transpore Teresa Tape, 2x10 (in/yd) Discharge Instruction: Secure dressing with tape as directed. Compression Wrap Compression Stockings Add-Ons Electronic Signature(s) Signed: 10/31/2023 3:46:39 PM By: Shawn Stall RN, BSN Entered By: Shawn Stall on 10/31/2023  11:38:46 -------------------------------------------------------------------------------- Vitals Details Patient Name: Date of Service: [facility], [place] [name]. 10/31/2023 11:15 A M Medical Record Number: [id number] Patient Account Number: [id number] Date of Birth/Sex: Treating RN: March 09, 1961 (62 y.o. F) Primary Care Ruchama Kubicek: Sharon Seller Other Clinician: Referring Kalieb Freeland: Treating Bralee Feldt/Extender: Charise Killian in Treatment: 3 Vital Signs Time Taken: 11:21 Temperature (F): 97.7 Height (in): 61 Pulse (bpm): 92 Weight (lbs): 283 Respiratory Rate (breaths/min): 18 Body Mass Index (BMI): 53.5 Blood Pressure (mmHg): 131/66 Capillary Blood Glucose (mg/dl): 440 Reference Range: 80 - 120 mg / dl Electronic Signature(s) Signed: 10/31/2023 2:57:56 PM By: Dayton Scrape Entered By: Dayton Scrape on 10/31/2023 11:22:11

## 2023-10-31 NOTE — Progress Notes (Signed)
SMRITHI, [name] ([id number]) [id number] Page 1 of 10 Visit Report for 10/31/2023 Chief Complaint Document Details Patient Name: Date of Service: [facility] Ferguson, Teresa ON [name]. 10/31/2023 11:15 A M Medical Record Number: [id number] Patient Account Number: [id number] Date of Birth/Sex: Treating RN: [birth year] (62 y.o. 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 10/31/2023 11:15 A M Medical Record Number: [id number] Patient Account Number: [id number] Date of Birth/Sex: Treating RN: Jul 21, 1961 (62 y.o. [name], [name] Primary Care Provider: Sharon Seller Other Clinician: Referring Provider: Treating Provider/Extender: Charise Killian in Treatment: 3 Debridement Performed for Assessment: Wound #3 Right Calcaneus Performed By: Physician Duanne Guess, MD The following information was scribed by: Shawn Stall The information was scribed for: Duanne Guess Debridement Type: Debridement Severity of Tissue Pre Debridement: Fat layer exposed Level of Consciousness (Pre-procedure): Awake and Alert Pre-procedure Verification/Time Out Yes - 11:30 Taken: Start Time: 11:31 Pain Control: Lidocaine 4% T opical  Solution Percent of Wound Bed Debrided: 90% T Area Debrided (cm): otal 7.77 Tissue and other material debrided: Viable, Non-Viable, Callus, Slough, Subcutaneous, Slough Level: Skin/Subcutaneous Tissue Debridement Description: Excisional Instrument: Curette Bleeding: Moderate Hemostasis Achieved: Pressure End Time: 11:39 Procedural Pain: 0 Post Procedural Pain: 0 Response to Treatment: Procedure was tolerated well Level of Consciousness (Post- Awake and Alert procedure): Post Debridement Measurements of Total Wound [name], [name] ([id number]) [id number] Page 2 of 10 Length: (cm) 2.2 Width: (cm) 5 Depth: (cm) 0.1 Volume: (cm) 0.864 Character of Wound/Ulcer Post Debridement: Improved Severity of Tissue Post Debridement: Fat layer exposed Post Procedure Diagnosis Same as Pre-procedure Electronic Signature(s) Signed: 10/31/2023 12:19:03 PM By: Duanne Guess MD FACS Signed: 10/31/2023 3:46:39 PM By: Shawn Stall RN, BSN Entered By: Shawn Stall on 10/31/2023 11:40:12 -------------------------------------------------------------------------------- HPI Details Patient Name: Date of Service: [facility], [place] [name]. 10/31/2023 11:15 A M Medical Record Number: [id number] Patient Account Number: [id number] Date of Birth/Sex: Treating RN: 1961/05/13 (62 y.o. F) Primary Care Provider: Sharon Seller Other Clinician: Referring Provider: Treating Provider/Extender: Charise Killian in Treatment: 3 History of Present Illness HPI Description: ADMISSION 10/09/2020 This is a 62 year old woman who is a type I diabetic with a last hemoglobin A1c of 10. She is referred from her primary doctor after developing a refractory rash in the folds of her lower abdominal pannus. There is no open wound here however extensive intertrigo with classic satellite lesions highly suggestive of candidal infection. This is reasonably extensive  on the lower abdominal pannus the superior part of her pubic symphysis all the way into the lower abdomen. She has been using nystatin powder and she did receive a course of Diflucan apparently for 10 days from her primary physician although I did not see this anywhere. I think things are somewhat better. Additionally the patient also brought up the fact that she has a small dime sized area on her left posterior calf and asked [place] to look at this. This is  been there for about a month. She does not wear compression stockings. Last sleep she pointed out a rash on her left buttock and left lower back she has been scratching at this. There is no open wound here per se. 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 She has chronic venous insufficiency and lymphedema here she will need stockings. The intertrigo in her lower abdominal pannus still is not much better. I gave her additional 2 weeks of Diflucan and topical nystatin. Perhaps minimally better. She is going to need to be more aggressive about separating the folds and keeping this clean and dry. She has a much better looking area under the left breast READMISSION 10/04/2023 ***ABI non-compressible*** This is a now 62 year old super morbidly obese type II diabetic (last hemoglobin A1c 8.5% in July 2024). She has COPD and CHF and is largely nonambulatory. She resides at [name]. She presents today with a large ulcer on her right heel. It appears to be pressure-induced in origin. Lindaann Pascal has been treating it with Santyl with saline moistened gauze and calcium alginate. Due to failure of the wound to improve, she has been  referred to the wound care center for further evaluation and management. 10/18/2023: The wound looks markedly better this week. There is some dry skin and callus around the opening. There is slough on the surface. The malodor that was present previously has abated. She has completed her course of oral antibiotics that I prescribed in response to her polymicrobial tissue culture. 10/31/2023: The main body of the wound has improved over the past 2 weeks, however there is evidence of pressure induced deep tissue injury just the lateral aspect of her calcaneus, adjacent to the main wound. She reports that she wears the Prevalon boot throughout the day but then is placed in a different boot to sleep at night and I am concerned this is the etiology of this damage. Electronic Signature(s) Signed: 10/31/2023 12:08:31 PM By: Duanne Guess MD FACS Entered By: Duanne Guess on 10/31/2023 12:08:31 [name] ([id number]) [id number] Page 3 of 10 -------------------------------------------------------------------------------- Physical Exam Details Patient Name: Date of Service: [name], Teresa [place] [name]. 10/31/2023 11:15 A M Medical Record Number: [id number] Patient Account Number: [id number] Date of Birth/Sex: Treating RN: 02-08-1961 (62 y.o. F) Primary Care Provider: Sharon Seller Other Clinician: Referring Provider: Treating Provider/Extender: Charise Killian in Treatment: 3 Constitutional . . . . no acute distress. Respiratory Normal work of breathing on room air.. Notes 10/31/2023: The main body of the wound has improved over the past 2 weeks, however there is evidence of pressure induced deep tissue injury just the lateral aspect of her calcaneus, adjacent to the main wound. Electronic Signature(s) Signed: 10/31/2023 12:09:07 PM By: Duanne Guess MD FACS Entered By: Duanne Guess on 10/31/2023  12:09:07 -------------------------------------------------------------------------------- Physician Orders Details Patient Name: Date of Service: [facility] Morton, [name]. 10/31/2023 11:15 A M Medical Record Number: [id number] Patient Account Number: [id number] Date of Birth/Sex: Treating RN: March 12, 1961 (62 y.o. Arta Silence Primary Care Provider: Sharon Seller Other Clinician: Referring Provider: Treating Provider/Extender: Charise Killian in Treatment: 3 The following information was scribed by: Shawn Stall The information was scribed for:  Duanne Guess Verbal / Phone Orders: No Diagnosis Coding ICD-10 Coding Code Description 5642105158 Non-pressure chronic ulcer of right heel and midfoot with fat layer exposed E11.621 Type 2 diabetes mellitus with foot ulcer I50.32 Chronic diastolic (congestive) heart failure E11.65 Type 2 diabetes mellitus with hyperglycemia J44.9 Chronic obstructive pulmonary disease, unspecified E66.01 Morbid (severe) obesity due to excess calories Follow-up Appointments ppointment in 2 weeks. - Dr. Leanord Hawking covering Dr. Lady Gary (front office to schedule) Return A Anesthetic (In clinic) Topical Lidocaine 4% applied to wound bed Bathing/ Shower/ Hygiene May shower with protection but do not get wound dressing(s) wet. Protect dressing(s) with water repellant cover (for example, large plastic bag) or a cast cover and may then take shower. Edema Control - Orders / Instructions Elevate legs to the level of the heart or above for 30 minutes daily and/or when sitting for 3-4 times a day throughout the day. Avoid standing for long periods of time. ROTASHA, GILL [name] ([id number]) [id number] Page 4 of 10 Off-Loading Heel suspension boot to: - globoped heel offloading sandal to right foot to transfer and walk with PT Prevalon Boot - prevalon boot to right foot at all times except while working with PT  closely monitor foot with any rubbing to heel from shoe or boot. ; Wound Treatment Wound #3 - Calcaneus Wound Laterality: Right Cleanser: Wound Cleanser 1 x Per Day/30 Days Discharge Instructions: Cleanse the wound with wound cleanser prior to applying a clean dressing using gauze sponges, not tissue or cotton balls. Prim Dressing: Maxorb Extra Ag+ Alginate Dressing, 4x4.75 (in/in) 1 x Per Day/30 Days ary Discharge Instructions: Apply to wound bed as instructed Secondary Dressing: ALLEVYN Heel 4 1/2in x 5 1/2in / 10.5cm x 13.5cm 1 x Per Day/30 Days Discharge Instructions: Apply over primary dressing as directed. Secondary Dressing: Woven Gauze Sponge, Non-Sterile 4x4 in 1 x Per Day/30 Days Discharge Instructions: Apply over primary dressing as directed. Secured With: [employer], 4.5x3.1 (in/yd) 1 x Per Day/30 Days Discharge Instructions: Secure with Kerlix as directed. Secured With: Transpore Surgical Tape, 2x10 (in/yd) 1 x Per Day/30 Days Discharge Instructions: Secure dressing with tape as directed. Electronic Signature(s) Signed: 10/31/2023 12:19:03 PM By: Duanne Guess MD FACS Entered By: Duanne Guess on 10/31/2023 12:14:47 -------------------------------------------------------------------------------- Problem List Details Patient Name: Date of Service: [facility] [place], [name]. 10/31/2023 11:15 A M Medical Record Number: [id number] Patient Account Number: [id number] Date of Birth/Sex: Treating RN: [birth year] (62 y.o. [name], [name] Primary Care Provider: Sharon Seller Other Clinician: Referring Provider: Treating Provider/Extender: Charise Killian in Treatment: 3 Active Problems ICD-10 Encounter Code Description Active Date MDM Diagnosis L97.412 Non-pressure chronic ulcer of right heel and midfoot with fat layer exposed 10/04/2023 No Yes E11.621 Type 2 diabetes mellitus with foot ulcer 10/04/2023 No Yes I50.32 Chronic  diastolic (congestive) heart failure 10/04/2023 No Yes E11.65 Type 2 diabetes mellitus with hyperglycemia 10/04/2023 No Yes J44.9 Chronic obstructive pulmonary disease, unspecified 10/04/2023 No Yes E66.01 Morbid (severe) obesity due to excess calories 10/04/2023 No Yes [name], [name] ([id number]) [id number] Page 5 of 10 Inactive Problems Resolved Problems Electronic Signature(s) Signed: 10/31/2023 12:07:19 PM By: Duanne Guess MD FACS Entered By: Duanne Guess on 10/31/2023 12:07:19 -------------------------------------------------------------------------------- Progress Note Details Patient Name: Date of Service: [name], [name]. 10/31/2023 11:15 A M Medical Record Number: [id number] Patient Account Number: [id number] Date of Birth/Sex: Treating RN: [birth year] (62 y.o. 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 She is referred from her primary doctor after developing a refractory rash in the folds of her lower abdominal pannus. There is no open wound here however extensive intertrigo with classic satellite lesions highly suggestive of candidal infection. This is reasonably extensive on the lower abdominal pannus the superior part of her pubic symphysis all the way into the lower abdomen. She has been using nystatin powder and she did receive a course of  Diflucan apparently for 10 days from her primary physician although I did not see this anywhere. I think things are somewhat better. Additionally the patient also brought up the fact that she has a small dime sized area on her left posterior calf and asked [place] to look at this. This is been there for about a month. She does not wear compression stockings. Last sleep she pointed out a rash on her left buttock and left lower back she has been scratching at this. There is no open wound here per se. 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 She has chronic venous insufficiency and lymphedema here she will need stockings. The intertrigo in her lower abdominal pannus still is not much better. I gave her additional 2 weeks of Diflucan and topical nystatin. Perhaps minimally better. She is going to need to be more aggressive about separating the folds and keeping this clean and dry. She has a much better looking area under the left breast READMISSION 10/04/2023 ***ABI non-compressible*** This is a now 62 year old super morbidly obese type II diabetic (last hemoglobin A1c 8.5% in July 2024). She has COPD and CHF and is largely nonambulatory. She resides at [facility]. She presents today with a large ulcer on her right heel. It appears to be pressure-induced in origin. Lindaann Pascal has been treating it with Santyl with saline moistened gauze and calcium alginate. Due to failure of the wound to improve, she has been referred to the wound care center for further evaluation and management. 10/18/2023: The wound looks markedly better this week. There is some dry skin and callus around the  opening. There is slough on the surface. The malodor that was present previously has abated. She has completed her course of oral antibiotics that I prescribed in response to her polymicrobial tissue culture. 10/31/2023: The main body of the wound has improved over the past 2 weeks, however there is evidence of pressure induced deep tissue injury just the lateral aspect of her calcaneus, adjacent to the main wound. She reports that she wears the Prevalon boot throughout the day but then is placed in a different boot to sleep at night and I am concerned this is the etiology of this damage. Patient History Information obtained from Patient, Chart. Family History Cancer - Maternal Grandparents, Diabetes - Father,Siblings,Maternal Grandparents,Paternal Grandparents,  Heart Disease - Father,Siblings,Child,Maternal Grandparents, Hypertension - Maternal Grandparents,Paternal Grandparents,Mother,Father,Siblings,Child, Kidney Disease - Father,Mother, Stroke - Mother,Siblings, Thyroid Problems - Father,Siblings, No family history of Hereditary Spherocytosis, Lung Disease, Seizures, Tuberculosis. Social History Never smoker, Marital Status - Married, Alcohol Use - Never, Drug Use - No History, Caffeine Use - Moderate. [name], [name] ([id number]) [id number] Page 6 of 10 Medical History Eyes Patient has history of Cataracts - extractions Denies history of Glaucoma, Optic Neuritis Ear/Nose/Mouth/Throat Denies history of Chronic sinus problems/congestion, Middle ear problems Hematologic/Lymphatic Patient has history of Anemia Denies history of Hemophilia, Human Immunodeficiency Virus, Lymphedema, Sickle Cell Disease Respiratory Patient has history of Asthma, Chronic Obstructive Pulmonary Disease (COPD), Sleep Apnea Denies history of Aspiration, Pneumothorax, Tuberculosis Cardiovascular Patient has history of Congestive Heart Failure, Coronary Artery Disease,  Hypertension, Peripheral Venous Disease Denies history of Angina, Arrhythmia, Deep Vein Thrombosis, Hypotension, Myocardial Infarction, Peripheral Arterial Disease, Phlebitis Gastrointestinal Denies history of Cirrhosis , Colitis, Crohns, Hepatitis A, Hepatitis [name], Hepatitis C Endocrine Patient has history of Type II Diabetes Denies history of Type I Diabetes Genitourinary Denies history of End Stage Renal Disease Immunological Denies history of Lupus Erythematosus, Raynauds, Scleroderma Integumentary (Skin) Denies history of History of Burn Musculoskeletal Patient has history of Osteoarthritis Denies history of Gout, Rheumatoid Arthritis, Osteomyelitis Neurologic Patient has history of Neuropathy Denies history of Dementia, Quadriplegia, Paraplegia, Seizure Disorder Oncologic Denies history of Received Chemotherapy, Received Radiation Psychiatric Patient has history of Confinement Anxiety Denies history of Anorexia/bulimia Hospitalization/Surgery History - polypectomy 04/2022. - cataract extraction. - c-section. - cholecystectomy. - sigmoid colon resection. Medical A Surgical History Notes nd Constitutional Symptoms (General Health) morbid obesity Respiratory O2 dependent Cardiovascular hyperlipidemia, cardiomegaly Gastrointestinal GERD, IBS Endocrine hypothyroidism Genitourinary overactive bladder, uterine prolapse, right renal cyst Oncologic sigmoid colon Psychiatric depression Objective Constitutional no acute distress. Vitals Time Taken: 11:21 AM, Height: 61 in, Weight: 283 lbs, BMI: 53.5, Temperature: 97.7 F, Pulse: 92 bpm, Respiratory Rate: 18 breaths/min, Blood Pressure: 131/66 mmHg, Capillary Blood Glucose: 260 mg/dl. Respiratory Normal work of breathing on room air.. General Notes: 10/31/2023: The main body of the wound has improved over the past 2 weeks, however there is evidence of pressure induced deep tissue injury just the lateral aspect of her  calcaneus, adjacent to the main wound. Integumentary (Hair, Skin) Wound #3 status is Open. Original cause of wound was Blister. The date acquired was: 07/07/2023. The wound has been in treatment 3 weeks. The wound is located on the Right Calcaneus. The wound measures 2.2cm length x 5cm width x 0.1cm depth; 8.639cm^2 area and 0.864cm^3 volume. There is Fat Layer (Subcutaneous Tissue) exposed. There is no tunneling or undermining noted. There is a medium amount of serosanguineous drainage noted. The wound margin is flat and intact. There is large (67-100%) pink, pale granulation within the wound bed. There is a small (1-33%) amount of necrotic tissue within the wound bed including Adherent Slough. The periwound skin appearance exhibited: Callus, Dry/Scaly. The periwound skin appearance did not exhibit: Dynastee, Heath [name] ([id number]) [id number] Page 7 of 10 Induration, Rash, Scarring, Maceration, Atrophie Blanche, Cyanosis, Ecchymosis, Hemosiderin Staining, Mottled, Pallor, Rubor, Erythema. Periwound temperature was noted as No Abnormality. The periwound has tenderness on palpation. Assessment Active Problems ICD-10 Non-pressure chronic ulcer of right heel and midfoot with fat layer exposed Type 2 diabetes mellitus with foot ulcer Chronic diastolic (congestive) heart failure Type 2 diabetes mellitus with hyperglycemia Chronic obstructive pulmonary disease, unspecified Morbid (severe) obesity due to excess calories Procedures Wound #3 Pre-procedure diagnosis of  Wound #3 is a Diabetic Wound/Ulcer of the Lower Extremity located on the Right Calcaneus .Severity of Tissue Pre Debridement is: Fat layer exposed. There was a Excisional Skin/Subcutaneous Tissue Debridement with a total area of 7.77 sq cm performed by Duanne Guess, MD. With the following instrument(s): Curette to remove Viable and Non-Viable tissue/material. Material removed includes  Callus, Subcutaneous Tissue, and Slough after achieving pain control using Lidocaine 4% Topical Solution. A time out was conducted at 11:30, prior to the start of the procedure. A Moderate amount of bleeding was controlled with Pressure. The procedure was tolerated well with a pain level of 0 throughout and a pain level of 0 following the procedure. Post Debridement Measurements: 2.2cm length x 5cm width x 0.1cm depth; 0.864cm^3 volume. Character of Wound/Ulcer Post Debridement is improved. Severity of Tissue Post Debridement is: Fat layer exposed. Post procedure Diagnosis Wound #3: Same as Pre-Procedure Plan Follow-up Appointments: Return Appointment in 2 weeks. - Dr. Leanord Hawking covering Dr. Lady Gary (front office to schedule) Anesthetic: (In clinic) Topical Lidocaine 4% applied to wound bed Bathing/ Shower/ Hygiene: May shower with protection but do not get wound dressing(s) wet. Protect dressing(s) with water repellant cover (for example, large plastic bag) or a cast cover and may then take shower. Edema Control - Orders / Instructions: Elevate legs to the level of the heart or above for 30 minutes daily and/or when sitting for 3-4 times a day throughout the day. Avoid standing for long periods of time. Off-Loading: Heel suspension boot to: - globoped heel offloading sandal to right foot to transfer and walk with PT Prevalon Boot - prevalon boot to right foot at all times except while working with PT closely monitor foot with any rubbing to heel from shoe or boot. ; WOUND #3: - Calcaneus Wound Laterality: Right Cleanser: Wound Cleanser 1 x Per Day/30 Days Discharge Instructions: Cleanse the wound with wound cleanser prior to applying a clean dressing using gauze sponges, not tissue or cotton balls. Prim Dressing: Maxorb Extra Ag+ Alginate Dressing, 4x4.75 (in/in) 1 x Per Day/30 Days ary Discharge Instructions: Apply to wound bed as instructed Secondary Dressing: ALLEVYN Heel 4 1/2in x 5 1/2in  / 10.5cm x 13.5cm 1 x Per Day/30 Days Discharge Instructions: Apply over primary dressing as directed. Secondary Dressing: Woven Gauze Sponge, Non-Sterile 4x4 in 1 x Per Day/30 Days Discharge Instructions: Apply over primary dressing as directed. Secured With: [employer], 4.5x3.1 (in/yd) 1 x Per Day/30 Days Discharge Instructions: Secure with Kerlix as directed. Secured With: Transpore Surgical T ape, 2x10 (in/yd) 1 x Per Day/30 Days Discharge Instructions: Secure dressing with tape as directed. 10/31/2023: The main body of the wound has improved over the past 2 weeks, however there is evidence of pressure induced deep tissue injury just the lateral aspect of her calcaneus, adjacent to the main wound. She reports that she wears the Prevalon boot throughout the day but then is placed in a different boot to sleep at night and I am concerned this is the etiology of this damage. I used a curette to debride slough, callus, and subcutaneous tissue from the main wound, as well as from the new area of pressure induced deep tissue injury. This is actually an open ulcer, identified after debridement. I have written to her skilled nursing facility that she needs to be in the Prevalon boot at night, as well as during the day to avoid further damage. Continue silver alginate with foam heel cup and Kerlix. Follow-up in 2 weeks. Electronic  Signature(s) Signed: 10/31/2023 12:16:21 PM By: Duanne Guess MD FACS Entered By: Duanne Guess on 10/31/2023 12:16:21 [name] ([id number]) [id number] Page 8 of 10 -------------------------------------------------------------------------------- HxROS Details Patient Name: Date of Service: [name], Teresa YQ [name]. 10/31/2023 11:15 A M Medical Record Number: [id number] Patient Account Number: [id number] Date of Birth/Sex: Treating RN: 1961-04-18 (62 y.o. F) Primary Care Provider: Sharon Seller Other  Clinician: Referring Provider: Treating Provider/Extender: Charise Killian in Treatment: 3 Information Obtained From Patient Chart Constitutional Symptoms (General Health) Medical History: Past Medical History Notes: morbid obesity Eyes Medical History: Positive for: Cataracts - extractions Negative for: Glaucoma; Optic Neuritis Ear/Nose/Mouth/Throat Medical History: Negative for: Chronic sinus problems/congestion; Middle ear problems Hematologic/Lymphatic Medical History: Positive for: Anemia Negative for: Hemophilia; Human Immunodeficiency Virus; Lymphedema; Sickle Cell Disease Respiratory Medical History: Positive for: Asthma; Chronic Obstructive Pulmonary Disease (COPD); Sleep Apnea Negative for: Aspiration; Pneumothorax; Tuberculosis Past Medical History Notes: O2 dependent Cardiovascular Medical History: Positive for: Congestive Heart Failure; Coronary Artery Disease; Hypertension; Peripheral Venous Disease Negative for: Angina; Arrhythmia; Deep Vein Thrombosis; Hypotension; Myocardial Infarction; Peripheral Arterial Disease; Phlebitis Past Medical History Notes: hyperlipidemia, cardiomegaly Gastrointestinal Medical History: Negative for: Cirrhosis ; Colitis; Crohns; Hepatitis A; Hepatitis [name]; Hepatitis C Past Medical History Notes: GERD, IBS Endocrine Medical History: Positive for: Type II Diabetes Negative for: Type I Diabetes Past Medical History Notes: hypothyroidism Time with diabetes: 47 Treated with: Insulin Blood sugar tested every day: Yes Tested : checked at facility Genitourinary [name], [name] ([id number]) [id number] Page 9 of 10 Medical History: Negative for: End Stage Renal Disease Past Medical History Notes: overactive bladder, uterine prolapse, right renal cyst Immunological Medical History: Negative for: Lupus Erythematosus; Raynauds; Scleroderma Integumentary (Skin) Medical  History: Negative for: History of Burn Musculoskeletal Medical History: Positive for: Osteoarthritis Negative for: Gout; Rheumatoid Arthritis; Osteomyelitis Neurologic Medical History: Positive for: Neuropathy Negative for: Dementia; Quadriplegia; Paraplegia; Seizure Disorder Oncologic Medical History: Negative for: Received Chemotherapy; Received Radiation Past Medical History Notes: sigmoid colon Psychiatric Medical History: Positive for: Confinement Anxiety Negative for: Anorexia/bulimia Past Medical History Notes: depression HBO Extended History Items Eyes: Cataracts Immunizations Pneumococcal Vaccine: Received Pneumococcal Vaccination: Yes Received Pneumococcal Vaccination On or After 60th Birthday: No Implantable Devices None Hospitalization / Surgery History Type of Hospitalization/Surgery polypectomy 04/2022 cataract extraction c-section cholecystectomy sigmoid colon resection Family and Social History Cancer: Yes - Maternal Grandparents; Diabetes: Yes - Father,Siblings,Maternal Grandparents,Paternal Grandparents; Heart Disease: Yes - Father,Siblings,Child,Maternal Grandparents; Hereditary Spherocytosis: No; Hypertension: Yes - Maternal Grandparents,Paternal Grandparents,Mother,Father,Siblings,Child; Kidney Disease: Yes - Father,Mother; Lung Disease: No; Seizures: No; Stroke: Yes - Mother,Siblings; Thyroid Problems: Yes - Father,Siblings; Tuberculosis: No; Never smoker; Marital Status - Married; Alcohol Use: Never; Drug Use: No History; Caffeine Use: Moderate; Financial Concerns: No; Food, Clothing or Shelter Needs: No; Support System Lacking: No; Transportation Concerns: No Electronic Signature(s) Signed: 10/31/2023 12:19:03 PM By: Duanne Guess MD FACS Entered By: Duanne Guess on 10/31/2023 12:08:39 [name] ([id number]) [id number] Page 10 of  10 -------------------------------------------------------------------------------- SuperBill Details Patient Name: Date of Service: [name] S[place], Teresa GU [name]. 10/31/2023 Medical Record Number: [id number] Patient Account Number: [id number] Date of Birth/Sex: Treating RN: 01/08/1961 (62 y.o. F) Primary Care Provider: Sharon Seller Other Clinician: Referring Provider: Treating Provider/Extender: Charise Killian in Treatment: 3 Diagnosis Coding ICD-10 Codes Code Description [phone] Non-pressure chronic ulcer of right heel and midfoot with fat layer exposed E11.621 Type 2 diabetes mellitus with foot ulcer I50.32 Chronic diastolic (congestive) heart failure E11.65 Type 2 diabetes mellitus  with hyperglycemia J44.9 Chronic obstructive pulmonary disease, unspecified E66.01 Morbid (severe) obesity due to excess calories Facility Procedures : CPT4 Code: [id number] Description: 11042 - DEB SUBQ TISSUE 20 SQ CM/< ICD-10 Diagnosis Description L97.412 Non-pressure chronic ulcer of right heel and midfoot with fat layer exposed Modifier: Quantity: 1 Physician Procedures : CPT4 Code Description Modifier [id number] 99214 - WC PHYS LEVEL 4 - EST PT ICD-10 Diagnosis Description L97.412 Non-pressure chronic ulcer of right heel and midfoot with fat layer exposed E11.621 Type 2 diabetes mellitus with foot ulcer E11.65 Type 2  diabetes mellitus with hyperglycemia I50.32 Chronic diastolic (congestive) heart failure Quantity: 1 : [id number] 11042 - WC PHYS SUBQ TISS 20 SQ CM ICD-10 Diagnosis Description L97.412 Non-pressure chronic ulcer of right heel and midfoot with fat layer exposed Quantity: 1 Electronic Signature(s) Signed: 10/31/2023 12:18:38 PM By: Duanne Guess MD FACS Entered By: Duanne Guess on 10/31/2023 12:18:38

## 2023-11-08 DIAGNOSIS — E1140 Type 2 diabetes mellitus with diabetic neuropathy, unspecified: Secondary | ICD-10-CM | POA: Diagnosis not present

## 2023-11-08 DIAGNOSIS — L97411 Non-pressure chronic ulcer of right heel and midfoot limited to breakdown of skin: Secondary | ICD-10-CM | POA: Diagnosis not present

## 2023-11-09 DIAGNOSIS — L603 Nail dystrophy: Secondary | ICD-10-CM | POA: Diagnosis not present

## 2023-11-09 DIAGNOSIS — Z7984 Long term (current) use of oral hypoglycemic drugs: Secondary | ICD-10-CM | POA: Diagnosis not present

## 2023-11-09 DIAGNOSIS — L602 Onychogryphosis: Secondary | ICD-10-CM | POA: Diagnosis not present

## 2023-11-09 DIAGNOSIS — E1151 Type 2 diabetes mellitus with diabetic peripheral angiopathy without gangrene: Secondary | ICD-10-CM | POA: Diagnosis not present

## 2023-11-09 DIAGNOSIS — L84 Corns and callosities: Secondary | ICD-10-CM | POA: Diagnosis not present

## 2023-11-09 NOTE — Progress Notes (Signed)
No explanation - it was part of an order set

## 2023-11-10 DIAGNOSIS — E1165 Type 2 diabetes mellitus with hyperglycemia: Secondary | ICD-10-CM | POA: Diagnosis not present

## 2023-11-10 DIAGNOSIS — Z79899 Other long term (current) drug therapy: Secondary | ICD-10-CM | POA: Diagnosis not present

## 2023-11-12 ENCOUNTER — Emergency Department (HOSPITAL_COMMUNITY): Payer: Medicare HMO

## 2023-11-12 ENCOUNTER — Encounter (HOSPITAL_COMMUNITY): Payer: Self-pay | Admitting: Emergency Medicine

## 2023-11-12 ENCOUNTER — Other Ambulatory Visit: Payer: Self-pay

## 2023-11-12 ENCOUNTER — Inpatient Hospital Stay (HOSPITAL_COMMUNITY): Payer: Medicare HMO

## 2023-11-12 ENCOUNTER — Inpatient Hospital Stay (HOSPITAL_COMMUNITY)
Admission: EM | Admit: 2023-11-12 | Discharge: 2023-11-16 | DRG: 637 | Disposition: A | Discharge status: Skilled Nursing Facility | Payer: Medicare HMO | Source: Skilled Nursing Facility | Attending: Internal Medicine | Admitting: Internal Medicine

## 2023-11-12 DIAGNOSIS — D631 Anemia in chronic kidney disease: Secondary | ICD-10-CM | POA: Diagnosis present

## 2023-11-12 DIAGNOSIS — G9341 Metabolic encephalopathy: Secondary | ICD-10-CM | POA: Diagnosis not present

## 2023-11-12 DIAGNOSIS — Z8249 Family history of ischemic heart disease and other diseases of the circulatory system: Secondary | ICD-10-CM

## 2023-11-12 DIAGNOSIS — R918 Other nonspecific abnormal finding of lung field: Secondary | ICD-10-CM | POA: Diagnosis not present

## 2023-11-12 DIAGNOSIS — J440 Chronic obstructive pulmonary disease with acute lower respiratory infection: Secondary | ICD-10-CM | POA: Diagnosis present

## 2023-11-12 DIAGNOSIS — D649 Anemia, unspecified: Secondary | ICD-10-CM | POA: Diagnosis not present

## 2023-11-12 DIAGNOSIS — E785 Hyperlipidemia, unspecified: Secondary | ICD-10-CM | POA: Diagnosis not present

## 2023-11-12 DIAGNOSIS — M19071 Primary osteoarthritis, right ankle and foot: Secondary | ICD-10-CM | POA: Diagnosis not present

## 2023-11-12 DIAGNOSIS — J181 Lobar pneumonia, unspecified organism: Secondary | ICD-10-CM | POA: Diagnosis not present

## 2023-11-12 DIAGNOSIS — L89613 Pressure ulcer of right heel, stage 3: Secondary | ICD-10-CM | POA: Diagnosis not present

## 2023-11-12 DIAGNOSIS — Z79890 Hormone replacement therapy (postmenopausal): Secondary | ICD-10-CM

## 2023-11-12 DIAGNOSIS — L304 Erythema intertrigo: Secondary | ICD-10-CM | POA: Diagnosis present

## 2023-11-12 DIAGNOSIS — R21 Rash and other nonspecific skin eruption: Secondary | ICD-10-CM | POA: Diagnosis present

## 2023-11-12 DIAGNOSIS — Z6841 Body Mass Index (BMI) 40.0 and over, adult: Secondary | ICD-10-CM | POA: Diagnosis not present

## 2023-11-12 DIAGNOSIS — Z713 Dietary counseling and surveillance: Secondary | ICD-10-CM

## 2023-11-12 DIAGNOSIS — J9622 Acute and chronic respiratory failure with hypercapnia: Secondary | ICD-10-CM | POA: Diagnosis not present

## 2023-11-12 DIAGNOSIS — G9340 Encephalopathy, unspecified: Secondary | ICD-10-CM | POA: Diagnosis present

## 2023-11-12 DIAGNOSIS — R062 Wheezing: Secondary | ICD-10-CM | POA: Diagnosis not present

## 2023-11-12 DIAGNOSIS — R0602 Shortness of breath: Secondary | ICD-10-CM | POA: Diagnosis not present

## 2023-11-12 DIAGNOSIS — J9621 Acute and chronic respiratory failure with hypoxia: Secondary | ICD-10-CM | POA: Diagnosis not present

## 2023-11-12 DIAGNOSIS — L03115 Cellulitis of right lower limb: Secondary | ICD-10-CM | POA: Diagnosis not present

## 2023-11-12 DIAGNOSIS — Y95 Nosocomial condition: Secondary | ICD-10-CM | POA: Diagnosis present

## 2023-11-12 DIAGNOSIS — E559 Vitamin D deficiency, unspecified: Secondary | ICD-10-CM | POA: Diagnosis not present

## 2023-11-12 DIAGNOSIS — Z885 Allergy status to narcotic agent status: Secondary | ICD-10-CM

## 2023-11-12 DIAGNOSIS — E11649 Type 2 diabetes mellitus with hypoglycemia without coma: Secondary | ICD-10-CM | POA: Diagnosis not present

## 2023-11-12 DIAGNOSIS — F32A Depression, unspecified: Secondary | ICD-10-CM | POA: Diagnosis present

## 2023-11-12 DIAGNOSIS — I130 Hypertensive heart and chronic kidney disease with heart failure and stage 1 through stage 4 chronic kidney disease, or unspecified chronic kidney disease: Secondary | ICD-10-CM | POA: Diagnosis not present

## 2023-11-12 DIAGNOSIS — R609 Edema, unspecified: Secondary | ICD-10-CM | POA: Diagnosis not present

## 2023-11-12 DIAGNOSIS — Z7951 Long term (current) use of inhaled steroids: Secondary | ICD-10-CM

## 2023-11-12 DIAGNOSIS — J189 Pneumonia, unspecified organism: Secondary | ICD-10-CM | POA: Diagnosis not present

## 2023-11-12 DIAGNOSIS — R9431 Abnormal electrocardiogram [ECG] [EKG]: Secondary | ICD-10-CM | POA: Diagnosis not present

## 2023-11-12 DIAGNOSIS — Z79899 Other long term (current) drug therapy: Secondary | ICD-10-CM

## 2023-11-12 DIAGNOSIS — L03116 Cellulitis of left lower limb: Secondary | ICD-10-CM | POA: Diagnosis present

## 2023-11-12 DIAGNOSIS — E11621 Type 2 diabetes mellitus with foot ulcer: Secondary | ICD-10-CM | POA: Diagnosis not present

## 2023-11-12 DIAGNOSIS — J449 Chronic obstructive pulmonary disease, unspecified: Secondary | ICD-10-CM | POA: Diagnosis not present

## 2023-11-12 DIAGNOSIS — Z85038 Personal history of other malignant neoplasm of large intestine: Secondary | ICD-10-CM

## 2023-11-12 DIAGNOSIS — N1832 Chronic kidney disease, stage 3b: Secondary | ICD-10-CM | POA: Diagnosis present

## 2023-11-12 DIAGNOSIS — R001 Bradycardia, unspecified: Secondary | ICD-10-CM | POA: Diagnosis not present

## 2023-11-12 DIAGNOSIS — E1165 Type 2 diabetes mellitus with hyperglycemia: Secondary | ICD-10-CM | POA: Diagnosis not present

## 2023-11-12 DIAGNOSIS — R680 Hypothermia, not associated with low environmental temperature: Secondary | ICD-10-CM | POA: Diagnosis present

## 2023-11-12 DIAGNOSIS — Z7982 Long term (current) use of aspirin: Secondary | ICD-10-CM

## 2023-11-12 DIAGNOSIS — I5032 Chronic diastolic (congestive) heart failure: Secondary | ICD-10-CM | POA: Diagnosis not present

## 2023-11-12 DIAGNOSIS — Z833 Family history of diabetes mellitus: Secondary | ICD-10-CM

## 2023-11-12 DIAGNOSIS — Z881 Allergy status to other antibiotic agents status: Secondary | ICD-10-CM

## 2023-11-12 DIAGNOSIS — E039 Hypothyroidism, unspecified: Secondary | ICD-10-CM | POA: Diagnosis present

## 2023-11-12 DIAGNOSIS — G4730 Sleep apnea, unspecified: Secondary | ICD-10-CM | POA: Diagnosis present

## 2023-11-12 DIAGNOSIS — E1122 Type 2 diabetes mellitus with diabetic chronic kidney disease: Secondary | ICD-10-CM | POA: Diagnosis present

## 2023-11-12 DIAGNOSIS — R0989 Other specified symptoms and signs involving the circulatory and respiratory systems: Secondary | ICD-10-CM | POA: Diagnosis not present

## 2023-11-12 DIAGNOSIS — Z888 Allergy status to other drugs, medicaments and biological substances status: Secondary | ICD-10-CM

## 2023-11-12 DIAGNOSIS — E16A3 Hypoglycemia level 3: Secondary | ICD-10-CM | POA: Diagnosis present

## 2023-11-12 DIAGNOSIS — B372 Candidiasis of skin and nail: Secondary | ICD-10-CM | POA: Diagnosis present

## 2023-11-12 DIAGNOSIS — Z9049 Acquired absence of other specified parts of digestive tract: Secondary | ICD-10-CM

## 2023-11-12 DIAGNOSIS — R55 Syncope and collapse: Secondary | ICD-10-CM | POA: Diagnosis not present

## 2023-11-12 DIAGNOSIS — Z794 Long term (current) use of insulin: Secondary | ICD-10-CM | POA: Diagnosis not present

## 2023-11-12 DIAGNOSIS — Z882 Allergy status to sulfonamides status: Secondary | ICD-10-CM

## 2023-11-12 DIAGNOSIS — R61 Generalized hyperhidrosis: Secondary | ICD-10-CM | POA: Diagnosis not present

## 2023-11-12 DIAGNOSIS — L97411 Non-pressure chronic ulcer of right heel and midfoot limited to breakdown of skin: Secondary | ICD-10-CM | POA: Diagnosis not present

## 2023-11-12 DIAGNOSIS — R4182 Altered mental status, unspecified: Secondary | ICD-10-CM | POA: Diagnosis not present

## 2023-11-12 DIAGNOSIS — L089 Local infection of the skin and subcutaneous tissue, unspecified: Secondary | ICD-10-CM | POA: Diagnosis not present

## 2023-11-12 DIAGNOSIS — E782 Mixed hyperlipidemia: Secondary | ICD-10-CM | POA: Diagnosis present

## 2023-11-12 DIAGNOSIS — Z9981 Dependence on supplemental oxygen: Secondary | ICD-10-CM

## 2023-11-12 LAB — CBC WITH DIFFERENTIAL/PLATELET
Abs Immature Granulocytes: 0.12 10*3/uL — ABNORMAL HIGH (ref 0.00–0.07)
Basophils Absolute: 0.1 10*3/uL (ref 0.0–0.1)
Basophils Relative: 0 %
Eosinophils Absolute: 0.4 10*3/uL (ref 0.0–0.5)
Eosinophils Relative: 2 %
HCT: 39.9 % (ref 36.0–46.0)
Hemoglobin: 12.6 g/dL (ref 12.0–15.0)
Immature Granulocytes: 1 %
Lymphocytes Relative: 4 %
Lymphs Abs: 0.8 10*3/uL (ref 0.7–4.0)
MCH: 27.3 pg (ref 26.0–34.0)
MCHC: 31.6 g/dL (ref 30.0–36.0)
MCV: 86.6 fL (ref 80.0–100.0)
Monocytes Absolute: 0.5 10*3/uL (ref 0.1–1.0)
Monocytes Relative: 3 %
Neutro Abs: 16.7 10*3/uL — ABNORMAL HIGH (ref 1.7–7.7)
Neutrophils Relative %: 90 %
Platelets: 262 10*3/uL (ref 150–400)
RBC: 4.61 MIL/uL (ref 3.87–5.11)
RDW: 15.8 % — ABNORMAL HIGH (ref 11.5–15.5)
WBC: 18.6 10*3/uL — ABNORMAL HIGH (ref 4.0–10.5)
nRBC: 0 % (ref 0.0–0.2)

## 2023-11-12 LAB — BLOOD GAS, ARTERIAL
Acid-Base Excess: 9.2 mmol/L — ABNORMAL HIGH (ref 0.0–2.0)
Bicarbonate: 37.5 mmol/L — ABNORMAL HIGH (ref 20.0–28.0)
Drawn by: 37588
FIO2: 28 %
O2 Saturation: 95.7 %
Patient temperature: 34.1
pCO2 arterial: 60 mm[Hg] — ABNORMAL HIGH (ref 32–48)
pH, Arterial: 7.39 (ref 7.35–7.45)
pO2, Arterial: 73 mm[Hg] — ABNORMAL LOW (ref 83–108)

## 2023-11-12 LAB — LACTIC ACID, PLASMA
Lactic Acid, Venous: 0.5 mmol/L (ref 0.5–1.9)
Lactic Acid, Venous: 0.5 mmol/L (ref 0.5–1.9)

## 2023-11-12 LAB — COMPREHENSIVE METABOLIC PANEL
ALT: 12 U/L (ref 0–44)
AST: 16 U/L (ref 15–41)
Albumin: 2.7 g/dL — ABNORMAL LOW (ref 3.5–5.0)
Alkaline Phosphatase: 74 U/L (ref 38–126)
Anion gap: 7 (ref 5–15)
BUN: 25 mg/dL — ABNORMAL HIGH (ref 8–23)
CO2: 30 mmol/L (ref 22–32)
Calcium: 9.3 mg/dL (ref 8.9–10.3)
Chloride: 103 mmol/L (ref 98–111)
Creatinine, Ser: 1.63 mg/dL — ABNORMAL HIGH (ref 0.44–1.00)
GFR, Estimated: 35 mL/min — ABNORMAL LOW (ref >60)
Glucose, Bld: 139 mg/dL — ABNORMAL HIGH (ref 70–99)
Potassium: 4.3 mmol/L (ref 3.5–5.1)
Sodium: 140 mmol/L (ref 135–145)
Total Bilirubin: 0.4 mg/dL (ref <1.2)
Total Protein: 6.8 g/dL (ref 6.5–8.1)

## 2023-11-12 LAB — GLUCOSE, CAPILLARY
Glucose-Capillary: 162 mg/dL — ABNORMAL HIGH (ref 70–99)
Glucose-Capillary: 170 mg/dL — ABNORMAL HIGH (ref 70–99)
Glucose-Capillary: 177 mg/dL — ABNORMAL HIGH (ref 70–99)

## 2023-11-12 LAB — MRSA NEXT GEN BY PCR, NASAL: MRSA by PCR Next Gen: DETECTED — AB

## 2023-11-12 LAB — URINALYSIS, ROUTINE W REFLEX MICROSCOPIC
Bilirubin Urine: NEGATIVE
Glucose, UA: NEGATIVE mg/dL
Hgb urine dipstick: NEGATIVE
Ketones, ur: NEGATIVE mg/dL
Leukocytes,Ua: NEGATIVE
Nitrite: NEGATIVE
Protein, ur: 100 mg/dL — AB
Specific Gravity, Urine: 1.013 (ref 1.005–1.030)
pH: 5 (ref 5.0–8.0)

## 2023-11-12 LAB — TSH: TSH: 2.728 u[IU]/mL (ref 0.350–4.500)

## 2023-11-12 LAB — CBG MONITORING, ED
Glucose-Capillary: 127 mg/dL — ABNORMAL HIGH (ref 70–99)
Glucose-Capillary: 102 mg/dL — ABNORMAL HIGH (ref 70–99)
Glucose-Capillary: 118 mg/dL — ABNORMAL HIGH (ref 70–99)

## 2023-11-12 LAB — BRAIN NATRIURETIC PEPTIDE: B Natriuretic Peptide: 91 pg/mL (ref 0.0–100.0)

## 2023-11-12 MED ORDER — SODIUM CHLORIDE 0.9% FLUSH: 3.0000 mL | INTRAVENOUS | Status: DC | PRN

## 2023-11-12 MED ORDER — ARFORMOTEROL TARTRATE 15 MCG/2ML IN NEBU
15.0000 ug | INHALATION_SOLUTION | Freq: Two times a day (BID) | RESPIRATORY_TRACT | Status: DC
  Administered 2023-11-12 – 2023-11-16 (×8): 15 ug via RESPIRATORY_TRACT
  Filled 2023-11-12 (×8): qty 2

## 2023-11-12 MED ORDER — SODIUM CHLORIDE 0.9 % IV SOLN
1.0000 g | Freq: Three times a day (TID) | INTRAVENOUS | Status: DC
Start: 2023-11-12 — End: 2023-11-16
  Administered 2023-11-12 – 2023-11-16 (×11): 1 g via INTRAVENOUS
  Filled 2023-11-12: qty 5
  Filled 2023-11-12: qty 1
  Filled 2023-11-12 (×12): qty 5

## 2023-11-12 MED ORDER — VANCOMYCIN HCL 1500 MG/300ML IV SOLN
1500.0000 mg | INTRAVENOUS | Status: DC
  Administered 2023-11-14 – 2023-11-16 (×2): 1500 mg via INTRAVENOUS
  Filled 2023-11-12 (×2): qty 300

## 2023-11-12 MED ORDER — SODIUM CHLORIDE 0.9% FLUSH
3.0000 mL | Freq: Two times a day (BID) | INTRAVENOUS | Status: DC
  Administered 2023-11-12 – 2023-11-15 (×7): 3 mL via INTRAVENOUS

## 2023-11-12 MED ORDER — BUDESONIDE 0.25 MG/2ML IN SUSP
0.2500 mg | Freq: Two times a day (BID) | RESPIRATORY_TRACT | Status: DC
  Administered 2023-11-12 – 2023-11-16 (×8): 0.25 mg via RESPIRATORY_TRACT
  Filled 2023-11-12 (×8): qty 2

## 2023-11-12 MED ORDER — ALBUTEROL SULFATE (2.5 MG/3ML) 0.083% IN NEBU: 2.5000 mg | INHALATION_SOLUTION | RESPIRATORY_TRACT | Status: DC | PRN

## 2023-11-12 MED ORDER — ENOXAPARIN SODIUM 60 MG/0.6ML IJ SOSY
60.0000 mg | PREFILLED_SYRINGE | INTRAMUSCULAR | Status: DC
  Administered 2023-11-12 – 2023-11-15 (×4): 60 mg via SUBCUTANEOUS
  Filled 2023-11-12 (×4): qty 0.6

## 2023-11-12 MED ORDER — HYDRALAZINE HCL 20 MG/ML IJ SOLN
5.0000 mg | Freq: Four times a day (QID) | INTRAMUSCULAR | Status: DC | PRN
Start: 2023-11-12 — End: 2023-11-16
  Administered 2023-11-12 – 2023-11-13 (×3): 5 mg via INTRAVENOUS
  Filled 2023-11-12 (×4): qty 1

## 2023-11-12 MED ORDER — VANCOMYCIN HCL IN DEXTROSE 1-5 GM/200ML-% IV SOLN
1000.0000 mg | INTRAVENOUS | Status: AC
  Administered 2023-11-12 (×2): 1000 mg via INTRAVENOUS
  Filled 2023-11-12 (×2): qty 200

## 2023-11-12 MED ORDER — TRIAMCINOLONE 0.1 % CREAM:EUCERIN CREAM 1:1: TOPICAL_CREAM | Freq: Two times a day (BID) | CUTANEOUS | Status: DC

## 2023-11-12 MED ORDER — NYSTATIN-TRIAMCINOLONE 100000-0.1 UNIT/GM-% EX CREA
TOPICAL_CREAM | Freq: Two times a day (BID) | CUTANEOUS | Status: DC
  Administered 2023-11-14: 1 via TOPICAL
  Filled 2023-11-12: qty 15

## 2023-11-12 MED ORDER — NYSTATIN 100000 UNIT/GM EX POWD
Freq: Three times a day (TID) | CUTANEOUS | Status: DC
  Filled 2023-11-12: qty 15

## 2023-11-12 MED ORDER — VANCOMYCIN HCL IN DEXTROSE 1-5 GM/200ML-% IV SOLN
1000.0000 mg | INTRAVENOUS | Status: DC
Start: 2023-11-12 — End: 2023-11-12

## 2023-11-12 MED ORDER — IPRATROPIUM-ALBUTEROL 0.5-2.5 (3) MG/3ML IN SOLN
3.0000 mL | Freq: Four times a day (QID) | RESPIRATORY_TRACT | Status: DC
  Administered 2023-11-12 – 2023-11-13 (×5): 3 mL via RESPIRATORY_TRACT
  Filled 2023-11-12 (×5): qty 3

## 2023-11-12 MED ORDER — ACETAMINOPHEN 650 MG RE SUPP: 650.0000 mg | Freq: Four times a day (QID) | RECTAL | Status: DC | PRN

## 2023-11-12 MED ORDER — SODIUM CHLORIDE 0.9 % IV SOLN: 250.0000 mL | INTRAVENOUS | Status: AC | PRN

## 2023-11-12 MED ORDER — BISACODYL 5 MG PO TBEC: 5.0000 mg | DELAYED_RELEASE_TABLET | Freq: Every day | ORAL | Status: DC | PRN

## 2023-11-12 MED ORDER — ACETAMINOPHEN 325 MG PO TABS: 650.0000 mg | ORAL_TABLET | Freq: Four times a day (QID) | ORAL | Status: DC | PRN

## 2023-11-12 MED ORDER — VANCOMYCIN HCL 1500 MG/300ML IV SOLN: 1500.0000 mg | INTRAVENOUS | Status: DC

## 2023-11-12 MED ORDER — SODIUM CHLORIDE 0.9 % IV SOLN
2.0000 g | Freq: Once | INTRAVENOUS | Status: AC
  Administered 2023-11-12: 2 g via INTRAVENOUS
  Filled 2023-11-12: qty 10

## 2023-11-12 MED ORDER — CHLORHEXIDINE GLUCONATE CLOTH 2 % EX PADS
6.0000 | MEDICATED_PAD | Freq: Every day | CUTANEOUS | Status: DC
  Administered 2023-11-12 – 2023-11-16 (×5): 6 via TOPICAL

## 2023-11-12 MED ORDER — SENNOSIDES-DOCUSATE SODIUM 8.6-50 MG PO TABS: 1.0000 | ORAL_TABLET | Freq: Every evening | ORAL | Status: DC | PRN

## 2023-11-12 NOTE — Progress Notes (Signed)
Upon arrival to unit, Myself, Rodman Pickle and [employer] to bedside for skin assessment. Red, raised lesions all over body noted along chest, back, under breast, buttocks, pubic area, legs, and feet. Per patient areas are very itchy and have been present for about 1 week. Reached out to MD Regalado to make aware of lesions, MD request to call [facility] and find out how long rash has been present.  Call to [facility]. Per RN at facility patient did not have any lesions or rash when she left facility.Marland Kitchenonly a right heel wound. Relayed information to MD. At this time MD requesting patient be placed on Droplet isolation.

## 2023-11-12 NOTE — ED Provider Notes (Signed)
[place] EMERGENCY DEPARTMENT AT [facility] Provider Note   CSN: [id number] Arrival date & time: 11/12/23  7829     History  No chief complaint on file.   [name] is a 62 y.o. female.  Patient to ED from [facility] for hypoglycemia. Per EMS, the patient was found unresponsive by staff this morning stating her blood sugar was 25. They gave glucose and on EMS arrival it was 88. Last check by EMS on arrival was reported to be 131. No vomiting. She denied pain to EMS.   The history is provided by the patient. No language interpreter was used.       Home Medications Prior to Admission medications   Medication Sig Start Date End Date Taking? Authorizing Provider  acetaminophen (TYLENOL) 650 MG CR tablet Take 650 mg by mouth every 8 (eight) hours as needed for pain.    [provider]  albuterol (VENTOLIN HFA) 108 (90 Base) MCG/ACT inhaler Inhale 2 puffs into the lungs every 6 (six) hours as needed for wheezing or shortness of breath.    [provider]  alum & mag hydroxide-simeth (MAALOX/MYLANTA) 200-200-20 MG/5ML suspension Take 30 mLs by mouth every 2 (two) hours as needed for indigestion or heartburn. Do not exceed 4 doses in 24 hours    [provider]  Amino Acids-Protein Hydrolys (FEEDING SUPPLEMENT, PRO-STAT 64,) LIQD Take 30 mLs by mouth daily.    [provider]  amLODipine (NORVASC) 5 MG tablet Take 10 mg by mouth daily. 03/30/23   [provider]  ascorbic acid (VITAMIN C) 500 MG tablet Take 500 mg by mouth daily.    [provider]  aspirin EC 81 MG tablet Take 81 mg by mouth daily. Swallow whole.    [provider]  atorvastatin (LIPITOR) 10 MG tablet Take 10 mg by mouth at bedtime. 09/29/21   [provider]  Biotin 10 MG TABS Take 1 tablet by mouth daily.    [provider]  cetirizine (ZYRTEC) 10 MG tablet Take 10 mg by mouth daily.    [provider]   Cholecalciferol (VITAMIN D) 50 MCG (2000 UT) CAPS Take 2,000 Units by mouth daily.    [provider]  clotrimazole-betamethasone (LOTRISONE) cream Apply 1 Application topically 2 (two) times daily. 08/07/23   [provider]  Cranberry 600 MG TABS Take 300 mg by mouth 2 (two) times daily. Half tab bid    [provider]  docusate sodium (COLACE) 100 MG capsule Take 100 mg by mouth daily.    [provider]  Emollient (EUCERIN) lotion Apply 1 Application topically every 12 (twelve) hours as needed for dry skin.    [provider]  estradiol (ESTRACE) 0.1 MG/GM vaginal cream Discard plastic applicator. Insert a blueberry size amount (approximately 1 gram) of cream on fingertip inside vagina at bedtime every night for 1 week then every other night routinely (for long term use). 09/09/23   Donnita Falls, FNP  ferrous sulfate 325 (65 FE) MG tablet Take 325 mg by mouth daily.    [provider]  fluticasone furoate-vilanterol (BREO ELLIPTA) 100-25 MCG/ACT AEPB Inhale 1 puff into the lungs daily. 07/02/23   Shon Hale, MD  furosemide (LASIX) 20 MG tablet Take 20 mg by mouth daily. 03/29/22   [provider]  gabapentin (NEURONTIN) 300 MG capsule Take 1 capsule in AM, 1 capsule in PM 09/30/23   Van Clines, MD  Glucerna [facility])  LIQD Take 237 mLs by mouth daily. For wound healing    [provider]  hydrALAZINE (APRESOLINE) 25 MG tablet Take 1 tablet (25 mg total) by mouth every 8 (eight) hours. 11/26/21   Johnson, Clanford L, MD  ipratropium-albuterol (DUONEB) 0.5-2.5 (3) MG/3ML SOLN Take 3 mLs by nebulization every 4 (four) hours as needed. 08/12/23   [provider]  lansoprazole (PREVACID) 30 MG capsule Take 30 mg by mouth 2 (two) times daily before a meal. 02/27/23   [provider]  LANTUS 100 UNIT/ML injection Inject 0.5 mLs (50 Units total) into the skin at bedtime. 07/11/23   Dani Gobble, NP   levofloxacin (LEVAQUIN) 500 MG tablet Take 1 tablet (500 mg total) by mouth daily. 10/21/23   Eber Hong, MD  levothyroxine (SYNTHROID) 88 MCG tablet Take 88 mcg by mouth daily before breakfast.    [provider]  Menthol-Zinc Oxide (CALMOSEPTINE) 0.44-20.6 % OINT Apply 1 Application topically as needed (preservation/protection after incontinent care).    [provider]  Multiple Vitamins-Minerals (MULTIVITAMIN WITH MINERALS) tablet Take 1 tablet by mouth daily.    [provider]  MYRBETRIQ 50 MG TB24 tablet Take 1 tablet (50 mg total) by mouth daily. 08/04/23   Donnita Falls, FNP  NOVOLOG 100 UNIT/ML injection Inject 10-16 Units into the skin 3 (three) times daily with meals. 90-150= 10 units; 151-200= 11 units; 201-250= 12 units; 251-300= 13 units; 301-350= 14 units; 351-400= 15 units; above 400 = 16 units 07/11/23   Dani Gobble, NP  nystatin cream (MYCOSTATIN) Apply 1 Application topically 2 (two) times daily. 07/05/23   [provider]  omeprazole (PRILOSEC) 20 MG capsule Take 20 mg by mouth daily.    [provider]  ondansetron (ZOFRAN) 4 MG tablet Take 8 mg by mouth 2 (two) times daily. 03/30/23   [provider]  potassium chloride SA (K-DUR) 20 MEQ tablet Take 20 mEq by mouth daily.    [provider]  PROCRIT 4000 UNIT/ML injection Inject 4,000 Units into the skin every Monday, Wednesday, and Friday. Hold if hemoglobin is > 10 08/01/22   [provider]  propranolol (INDERAL) 80 MG tablet Take 80 mg by mouth daily.    [provider]  saccharomyces boulardii (FLORASTOR) 250 MG capsule Take 250 mg by mouth 2 (two) times daily.    [provider]  SANTYL 250 UNIT/GM ointment Apply 1 Application topically daily. 05/08/23   [provider]  sertraline (ZOLOFT) 100 MG tablet Take 100 mg by mouth daily. 03/31/23   [provider]  trimethoprim (TRIMPEX) 100 MG tablet Take 1 tablet  (100 mg total) by mouth daily. 09/09/23   Donnita Falls, FNP      Allergies    Carvedilol, Benicar [olmesartan], Codeine, Sulfa antibiotics, Trulicity [dulaglutide], and Ceftriaxone    Review of Systems   Review of Systems  Physical Exam Updated Vital Signs BP 122/69 (BP Location: Right Arm)   Pulse (!) 55   Temp (!) 93.6 F (34.2 C) (Rectal)   Resp 14   Wt 131 kg   LMP 08/06/2012   SpO2 96%   BMI 54.57 kg/m  Physical Exam Vitals and nursing note reviewed.  Constitutional:      Appearance: She is obese.  Cardiovascular:     Rate and Rhythm: Normal rate.     Heart sounds: No murmur heard. Pulmonary:     Comments: Belly breathing. Breath sounds distant - poor effort.  Abdominal:  Palpations: Abdomen is soft.     Comments: Obese. Multiple superficial abrasions to anterior abdomen. Bruise to left abdominal wall.   Skin:    General: Skin is warm and dry.     ED Results / Procedures / Treatments   Labs (all labs ordered are listed, but only abnormal results are displayed) Labs Reviewed  CBC WITH DIFFERENTIAL/PLATELET - Abnormal; Notable for the following components:      Result Value   WBC 18.6 (*)    RDW 15.8 (*)    Neutro Abs 16.7 (*)    Abs Immature Granulocytes 0.12 (*)    All other components within normal limits  COMPREHENSIVE METABOLIC PANEL - Abnormal; Notable for the following components:   Glucose, Bld 139 (*)    BUN 25 (*)    Creatinine, Ser 1.63 (*)    Albumin 2.7 (*)    GFR, Estimated 35 (*)    All other components within normal limits  URINALYSIS, ROUTINE W REFLEX MICROSCOPIC - Abnormal; Notable for the following components:   APPearance HAZY (*)    Protein, ur 100 (*)    Bacteria, UA RARE (*)    All other components within normal limits  BLOOD GAS, ARTERIAL - Abnormal; Notable for the following components:   pCO2 arterial 60 (*)    pO2, Arterial 73 (*)    Bicarbonate 37.5 (*)    Acid-Base Excess 9.2 (*)    All other components within  normal limits  CBG MONITORING, ED - Abnormal; Notable for the following components:   Glucose-Capillary 127 (*)    All other components within normal limits  CBG MONITORING, ED - Abnormal; Notable for the following components:   Glucose-Capillary 102 (*)    All other components within normal limits  CULTURE, BLOOD (ROUTINE X 2)  CULTURE, BLOOD (ROUTINE X 2)  MRSA NEXT GEN BY PCR, NASAL  BRAIN NATRIURETIC PEPTIDE  LACTIC ACID, PLASMA  LACTIC ACID, PLASMA  TSH   Results for orders placed or performed during the hospital encounter of 11/12/23  CBC with Differential  Result Value Ref Range   WBC 18.6 (H) 4.0 - 10.5 K/uL   RBC 4.61 3.87 - 5.11 MIL/uL   Hemoglobin 12.6 12.0 - 15.0 g/dL   HCT 78.2 95.6 - 21.3 %   MCV 86.6 80.0 - 100.0 fL   MCH 27.3 26.0 - 34.0 pg   MCHC 31.6 30.0 - 36.0 g/dL   RDW 08.6 (H) 57.8 - 46.9 %   Platelets 262 150 - 400 K/uL   nRBC 0.0 0.0 - 0.2 %   Neutrophils Relative % 90 %   Neutro Abs 16.7 (H) 1.7 - 7.7 K/uL   Lymphocytes Relative 4 %   Lymphs Abs 0.8 0.7 - 4.0 K/uL   Monocytes Relative 3 %   Monocytes Absolute 0.5 0.1 - 1.0 K/uL   Eosinophils Relative 2 %   Eosinophils Absolute 0.4 0.0 - 0.5 K/uL   Basophils Relative 0 %   Basophils Absolute 0.1 0.0 - 0.1 K/uL   Immature Granulocytes 1 %   Abs Immature Granulocytes 0.12 (H) 0.00 - 0.07 K/uL  Brain natriuretic peptide  Result Value Ref Range   B Natriuretic Peptide 91.0 0.0 - 100.0 pg/mL  Comprehensive metabolic panel  Result Value Ref Range   Sodium 140 135 - 145 mmol/L   Potassium 4.3 3.5 - 5.1 mmol/L   Chloride 103 98 - 111 mmol/L   CO2 30 22 - 32 mmol/L   Glucose, Bld 139 (H)  70 - 99 mg/dL   BUN 25 (H) 8 - 23 mg/dL   Creatinine, Ser 4.09 (H) 0.44 - 1.00 mg/dL   Calcium 9.3 8.9 - 81.1 mg/dL   Total Protein 6.8 6.5 - 8.1 g/dL   Albumin 2.7 (L) 3.5 - 5.0 g/dL   AST 16 15 - 41 U/L   ALT 12 0 - 44 U/L   Alkaline Phosphatase 74 38 - 126 U/L   Total Bilirubin 0.4 <1.2 mg/dL   GFR,  Estimated 35 (L) >60 mL/min   Anion gap 7 5 - 15  Urinalysis, Routine w reflex microscopic -Urine, Clean Catch  Result Value Ref Range   Color, Urine YELLOW YELLOW   APPearance HAZY (A) CLEAR   Specific Gravity, Urine 1.013 1.005 - 1.030   pH 5.0 5.0 - 8.0   Glucose, UA NEGATIVE NEGATIVE mg/dL   Hgb urine dipstick NEGATIVE NEGATIVE   Bilirubin Urine NEGATIVE NEGATIVE   Ketones, ur NEGATIVE NEGATIVE mg/dL   Protein, ur 914 (A) NEGATIVE mg/dL   Nitrite NEGATIVE NEGATIVE   Leukocytes,Ua NEGATIVE NEGATIVE   RBC / HPF 0-5 0 - 5 RBC/hpf   WBC, UA 0-5 0 - 5 WBC/hpf   Bacteria, UA RARE (A) NONE SEEN   Squamous Epithelial / HPF 0-5 0 - 5 /HPF   Budding Yeast PRESENT   Blood gas, arterial  Result Value Ref Range   FIO2 28 %   pH, Arterial 7.39 7.35 - 7.45   pCO2 arterial 60 (H) 32 - 48 mmHg   pO2, Arterial 73 (L) 83 - 108 mmHg   Bicarbonate 37.5 (H) 20.0 - 28.0 mmol/L   Acid-Base Excess 9.2 (H) 0.0 - 2.0 mmol/L   O2 Saturation 95.7 %   Patient temperature 34.1    Collection site RIGHT RADIAL    Drawn by [id number]    Allens test (pass/fail) PASS PASS  Lactic acid, plasma  Result Value Ref Range   Lactic Acid, Venous 0.5 0.5 - 1.9 mmol/L  Lactic acid, plasma  Result Value Ref Range   Lactic Acid, Venous 0.5 0.5 - 1.9 mmol/L  TSH  Result Value Ref Range   TSH 2.728 0.350 - 4.500 uIU/mL  CBG monitoring, ED  Result Value Ref Range   Glucose-Capillary 127 (H) 70 - 99 mg/dL  CBG monitoring, ED  Result Value Ref Range   Glucose-Capillary 102 (H) 70 - 99 mg/dL    EKG EKG Interpretation Date/Time:  Saturday November 12 2023 09:28:23 EST Ventricular Rate:  50 PR Interval:  150 QRS Duration:  133 QT Interval:  565 QTC Calculation: 516 R Axis:   -64  Text Interpretation: Sinus rhythm Nonspecific IVCD with LAD Left ventricular hypertrophy Anterior Q waves, possibly due to LVH No significant change since last tracing Heart rate slower Confirmed by Vanetta Mulders [phone]) on 11/12/2023  9:54:38 AM  Radiology CT Head Wo Contrast  Result Date: 11/12/2023 CLINICAL DATA:  Mental status change of unknown etiology. EXAM: CT HEAD WITHOUT CONTRAST TECHNIQUE: Contiguous axial images were obtained from the base of the skull through the vertex without intravenous contrast. RADIATION DOSE REDUCTION: This exam was performed according to the departmental dose-optimization program which includes automated exposure control, adjustment of the mA and/or kV according to patient size and/or use of iterative reconstruction technique. COMPARISON:  10/21/2023 FINDINGS: Brain: No evidence of acute infarction, hemorrhage, hydrocephalus, extra-axial collection or mass lesion/mass effect. There is mild diffuse low-attenuation within the subcortical and periventricular white matter compatible with chronic microvascular disease. Vascular: No  hyperdense vessel or unexpected calcification. Skull: Normal. Negative for fracture or focal lesion. Sinuses/Orbits: No acute finding. Other: None. IMPRESSION: 1. No acute intracranial abnormalities. 2. Chronic microvascular disease. Electronically Signed   By: Signa Kell M.D.   On: 11/12/2023 11:37   DG Chest Portable 1 View  Result Date: 11/12/2023 CLINICAL DATA:  Shortness of breath EXAM: PORTABLE CHEST - 1 VIEW COMPARISON:  10/21/2023 FINDINGS: Low lung volumes.  Perihilar opacities right greater than left. Heart size upper limits normal. No effusion. Visualized bones unremarkable. IMPRESSION: Low volumes with perihilar opacities right greater than left. Electronically Signed   By: Corlis Leak M.D.   On: 11/12/2023 10:53    Procedures Procedures    Medications Ordered in ED Medications  aztreonam (AZACTAM) 2 g in sodium chloride 0.9 % 100 mL IVPB (has no administration in time range)  vancomycin (VANCOCIN) IVPB 1000 mg/200 mL premix (1,000 mg Intravenous New Bag/Given 11/12/23 1206)    Followed by  vancomycin (VANCOREADY) IVPB 1500 mg/300 mL (has no administration  in time range)    ED Course/ Medical Decision Making/ A&P Clinical Course as of 11/12/23 1234  Sat Nov 12, 2023  1010 Patient's rectal temp 93.3. Warming blanket placed. Still wakes to sternal rub, no improvement in mental status. CBG 127 [SU]    Clinical Course User Index [SU] Elpidio Anis, PA-C                                 Medical Decision Making This patient presents to the ED for concern of hypoglycemia, this involves an extensive number of treatment options, and is a complaint that carries with it a high risk of complications and morbidity.  The differential diagnosis includes insulin overdose, sepsis, malignancy,    Co morbidities that complicate the patient evaluation  Oxygen dependent COPD, IDDM, HTN, morbid obesity   Additional history obtained:  Additional history and/or information obtained from chart review, notable for Previous admission requiring BiPap   Lab Tests:  I Ordered, and personally interpreted labs.  The pertinent results include:   Results for orders placed or performed during the hospital encounter of 11/12/23 -CBC with Differential:       Result                      Value             Ref Range          WBC                         18.6 (H)          4.0 - 10.5 K*      RBC                         4.61              3.87 - 5.11 *      Hemoglobin                  12.6              12.0 - 15.0 *      HCT                         39.9  36.0 - 46.0 %      MCV                         86.6              80.0 - 100.0*      MCH                         27.3              26.0 - 34.0 *      MCHC                        31.6              30.0 - 36.0 *      RDW                         15.8 (H)          11.5 - 15.5 %      Platelets                   262               150 - 400 K/*      nRBC                        0.0               0.0 - 0.2 %        Neutrophils Relative %      90                %                  Neutro Abs                  16.7 (H)           1.7 - 7.7 K/*      Lymphocytes Relative        4                 %                  Lymphs Abs                  0.8               0.7 - 4.0 K/*      Monocytes Relative          3                 %                  Monocytes Absolute          0.5               0.1 - 1.0 K/*      Eosinophils Relative        2                 %                  Eosinophils Absolute        0.4  0.0 - 0.5 K/*      Basophils Relative          0                 %                  Basophils Absolute          0.1               0.0 - 0.1 K/*      Immature Granulocytes       1                 %                  Abs Immature Granulocy*     0.12 (H)          0.00 - 0.07 *  Leukocytosis concerning for infection  -Brain natriuretic peptide:       Result                      Value             Ref Range          B Natriuretic Peptide       91.0              0.0 - 100.0 * -Comprehensive metabolic panel:       Result                      Value             Ref Range          Sodium                      140               135 - 145 mm*      Potassium                   4.3               3.5 - 5.1 mm*      Chloride                    103               98 - 111 mmo*      CO2                         30                22 - 32 mmol*      Glucose, Bld                139 (H)           70 - 99 mg/dL      BUN                         25 (H)            8 - 23 mg/dL       Creatinine, Ser             1.63 (H)          0.44 - 1.00 *  Calcium                     9.3               8.9 - 10.3 m*      Total Protein               6.8               6.5 - 8.1 g/*      Albumin                     2.7 (L)           3.5 - 5.0 g/*      AST                         16                15 - 41 U/L        ALT                         12                0 - 44 U/L         Alkaline Phosphatase        74                38 - 126 U/L       Total Bilirubin             0.4               <1.2 mg/dL         GFR, Estimated              35  (L)            >60 mL/min         Anion gap                   7                 5 - 15         **Creatinine is baseline  -Urinalysis, Routine w reflex microscopic -Urine, Clean Catch:       Result                      Value             Ref Range          Color, Urine                YELLOW            YELLOW             APPearance                  HAZY (A)          CLEAR              Specific Gravity, Urine     1.013             1.005 - 1.030      pH                          5.0  5.0 - 8.0          Glucose, UA                 NEGATIVE          NEGATIVE mg/*      Hgb urine dipstick          NEGATIVE          NEGATIVE           Bilirubin Urine             NEGATIVE          NEGATIVE           Ketones, ur                 NEGATIVE          NEGATIVE mg/*      Protein, ur                 100 (A)           NEGATIVE mg/*      Nitrite                     NEGATIVE          NEGATIVE           Leukocytes,Ua               NEGATIVE          NEGATIVE           RBC / HPF                   0-5               0 - 5 RBC/hpf      WBC, UA                     0-5               0 - 5 WBC/hpf      Bacteria, UA                RARE (A)          NONE SEEN          Squamous Epithelial / *     0-5               0 - 5 /HPF         Budding Yeast               PRESENT                          No evidence of UTI  -Blood gas, arterial:       Result                      Value             Ref Range          FIO2                        28                %                  pH, Arterial  7.39              7.35 - 7.45        pCO2 arterial               60 (H)            32 - 48 mmHg       pO2, Arterial               73 (L)            83 - 108 mmHg      Bicarbonate                 37.5 (H)          20.0 - 28.0 *      Acid-Base Excess            9.2 (H)           0.0 - 2.0 mm*      O2 Saturation               95.7              %                  Patient temperature         34.1                                  Collection site             RIGHT RADIAL                         Drawn by                    [id number]                                Allens test (pass/fail)     PASS              PASS           CO2, HCO3 similar to past results  -Lactic acid, plasma:       Result                      Value             Ref Range          Lactic Acid, Venous         0.5               0.5 - 1.9 mm* -Lactic acid, plasma:       Result                      Value             Ref Range          Lactic Acid, Venous         0.5               0.5 - 1.9 mm*  No evidence of sepsis  -CBG monitoring, ED:       Result  Value             Ref Range          Glucose-Capillary           127 (H)           70 - 99 mg/dL -CBG monitoring, ED:       Result                      Value             Ref Range          Glucose-Capillary           102 (H)           70 - 99 mg/dL   Will continue regular CBG's  Imaging Studies ordered:  I ordered imaging studies including CXR, head CT Radiologist interpretation:  CXR: perihilar opacities Head CT - No acute process identified.   Cardiac Monitoring:  The patient was maintained on a cardiac monitor.  I personally viewed and interpreted the cardiac monitored which showed an underlying rhythm of: Sinus rhythm   Medicines ordered and prescription drug management:  I ordered medication including Azactam and Vanc  for likely PNA (HCA, cephalosporin allergy)     Critical Interventions:  Repeat CBG's IV Abx    Problem List / ED Course:  Patient from [facility], found unresponsive this morning, CBG 25. Glucose given with improvement.  Found to be 93.3 by rectal temp - warming blanket provided WBC - 18 - antibiotics started for likely pneumonia    Social Determinants of Health:  Nursing home resident   Disposition:  After consideration of the diagnostic results and the patients response to treatment, I feel that the patient would  benefit from Admission.   Amount and/or Complexity of Data Reviewed Labs: ordered. Radiology: ordered.  Risk Prescription drug management.           Final Clinical Impression(s) / ED Diagnoses Final diagnoses:  Healthcare-associated pneumonia    Rx / DC Orders ED Discharge Orders     None         Elpidio Anis, PA-C 11/12/23 1235    Vanetta Mulders, MD 11/13/23 [phone]

## 2023-11-12 NOTE — H&P (Addendum)
History and Physical    Patient: [name]:[id number] DOB: January 29, 1961 DOA: 11/12/2023 DOS: the patient was seen and examined on 11/12/2023 PCP: [name], NP  Patient coming from: SNF  Chief Complaint: No chief complaint on file.  HPI: [name] is a 62 y.o. female with medical history significant of COPD on 2 to 3 L of oxygen, diabetes, hypertension, CKD, chronic diastolic heart failure, morbid obesity BMI of 50, last hospitalization in July treated for acute on chronic hypoxic respiratory failure secondary to pneumonia haemophilus influenza bacteremia presents from nursing home [facility]  due to altered mental status.  Patient was found at the facility unresponsive, CBG was 25, she received glucose.  He was transferred to the ED for further evaluation.   Evaluation in the ED, patient was responding initially to sternal rub now respond to voice.  Received glucagon.  CBG 110s.  She was found to be hypothermic temperature 93, leukocytosis, stable on 3 L of oxygen, protecting airway.  Chest x-ray with low volumes with perihilar opacity right greater than left.  She was started on IV antibiotics.  On my evaluation, patient wake up to voice, remain alert for conversation. She was able to tell me she was at [facility], and her blood sugar was low. She denies cough, chest pain, shortness of breath, abdominal pain, nausea vomiting.  She is noted to have a rash on her back and her thigh and pubic area.  She complained of itching.  She also has a right heel ulcer, redness on her right and left extremity.    Review of Systems: As mentioned in the history of present illness. All other systems reviewed and are negative. Past Medical History:  Diagnosis Date   Anemia    Arthritis    Asthma    Cancer of sigmoid (HCC)    COPD (chronic obstructive pulmonary disease) (HCC)    Depression    Diabetes mellitus    Diastolic dysfunction 05/15/2015   Dyspnea    Generalized  weakness    Hyperlipidemia    Hypertension    Hypothyroidism    Obesity    Palpitations    Pneumonia    PONV (postoperative nausea and vomiting)    Prolonged QT interval 05/14/2015   Sleep apnea    Past Surgical History:  Procedure Laterality Date   BIOPSY  04/06/2022   Procedure: BIOPSY;  Surgeon: Dolores Frame, MD;  [facility];  Service: Gastroenterology;;   CATARACT EXTRACTION     CESAREAN SECTION     CHOLECYSTECTOMY     COLONOSCOPY WITH PROPOFOL N/A 04/06/2022   Procedure: COLONOSCOPY WITH PROPOFOL;  Surgeon: Dolores Frame, MD;  [facility];  Service: Gastroenterology;  Laterality: N/A;  805 ASA 2 patient in [facility]   ESOPHAGOGASTRODUODENOSCOPY (EGD) WITH PROPOFOL N/A 04/06/2022   Procedure: ESOPHAGOGASTRODUODENOSCOPY (EGD) WITH PROPOFOL;  Surgeon: Dolores Frame, MD;  [facility];  Service: Gastroenterology;  Laterality: N/A;   HEMOSTASIS CLIP PLACEMENT  04/06/2022   Procedure: HEMOSTASIS CLIP PLACEMENT;  Surgeon: Dolores Frame, MD;  [facility];  Service: Gastroenterology;;   POLYPECTOMY  04/06/2022   Procedure: POLYPECTOMY;  Surgeon: Dolores Frame, MD;  [facility];  Service: Gastroenterology;;   SUBMUCOSAL TATTOO INJECTION  04/06/2022   Procedure: SUBMUCOSAL TATTOO INJECTION;  Surgeon: Dolores Frame, MD;  [facility];  Service: Gastroenterology;;   Social History:  reports that she has  never smoked. She has been exposed to tobacco smoke. She has never used smokeless tobacco. She reports that she does not drink alcohol and does not use drugs.  Allergies  Allergen Reactions   Carvedilol Palpitations   Benicar [Olmesartan] Swelling   Codeine Other (See Comments)    Confusion    Sulfa Antibiotics Swelling    Whole face swells   Trulicity [Dulaglutide] Diarrhea   Ceftriaxone Hives and Rash    Family History  Problem  Relation Age of Onset   Stroke Mother    Diabetes Father    Heart failure Father    Hypertension Father    Diabetes Sister    Heart failure Sister    Hypertension Sister    Stroke Sister    Cancer Other    Stroke Sister     Prior to Admission medications   Medication Sig Start Date End Date Taking? Authorizing Provider  acetaminophen (TYLENOL) 650 MG CR tablet Take 650 mg by mouth every 8 (eight) hours as needed for pain.    [provider]  albuterol (VENTOLIN HFA) 108 (90 Base) MCG/ACT inhaler Inhale 2 puffs into the lungs every 6 (six) hours as needed for wheezing or shortness of breath.    [provider]  alum & mag hydroxide-simeth (MAALOX/MYLANTA) 200-200-20 MG/5ML suspension Take 30 mLs by mouth every 2 (two) hours as needed for indigestion or heartburn. Do not exceed 4 doses in 24 hours    [provider]  Amino Acids-Protein Hydrolys (FEEDING SUPPLEMENT, PRO-STAT 64,) LIQD Take 30 mLs by mouth daily.    [provider]  amLODipine (NORVASC) 5 MG tablet Take 10 mg by mouth daily. 03/30/23   [provider]  ascorbic acid (VITAMIN C) 500 MG tablet Take 500 mg by mouth daily.    [provider]  aspirin EC 81 MG tablet Take 81 mg by mouth daily. Swallow whole.    [provider]  atorvastatin (LIPITOR) 10 MG tablet Take 10 mg by mouth at bedtime. 09/29/21   [provider]  Biotin 10 MG TABS Take 1 tablet by mouth daily.    [provider]  cetirizine (ZYRTEC) 10 MG tablet Take 10 mg by mouth daily.    [provider]  Cholecalciferol (VITAMIN D) 50 MCG (2000 UT) CAPS Take 2,000 Units by mouth daily.    [provider]  clotrimazole-betamethasone (LOTRISONE) cream Apply 1 Application topically 2 (two) times daily. 08/07/23   [provider]  Cranberry 600 MG TABS Take 300 mg by mouth 2 (two) times daily. Half tab bid    [provider]  docusate sodium (COLACE) 100 MG  capsule Take 100 mg by mouth daily.    [provider]  Emollient (EUCERIN) lotion Apply 1 Application topically every 12 (twelve) hours as needed for dry skin.    [provider]  estradiol (ESTRACE) 0.1 MG/GM vaginal cream Discard plastic applicator. Insert a blueberry size amount (approximately 1 gram) of cream on fingertip inside vagina at bedtime every night for 1 week then every other night routinely (for long term use). 09/09/23   Donnita Falls, FNP  ferrous sulfate 325 (65 FE) MG tablet Take 325 mg by mouth daily.    [provider]  fluticasone furoate-vilanterol (BREO ELLIPTA) 100-25 MCG/ACT AEPB Inhale 1 puff into the lungs daily. 07/02/23   Shon Hale, MD  furosemide (LASIX) 20 MG tablet Take 20 mg by mouth daily. 03/29/22   [provider]  gabapentin (  NEURONTIN) 300 MG capsule Take 1 capsule in AM, 1 capsule in PM 09/30/23   Van Clines, MD  Glucerna [facility]) LIQD Take 237 mLs by mouth daily. For wound healing    [provider]  hydrALAZINE (APRESOLINE) 25 MG tablet Take 1 tablet (25 mg total) by mouth every 8 (eight) hours. 11/26/21   Johnson, Clanford L, MD  ipratropium-albuterol (DUONEB) 0.5-2.5 (3) MG/3ML SOLN Take 3 mLs by nebulization every 4 (four) hours as needed. 08/12/23   [provider]  lansoprazole (PREVACID) 30 MG capsule Take 30 mg by mouth 2 (two) times daily before a meal. 02/27/23   [provider]  LANTUS 100 UNIT/ML injection Inject 0.5 mLs (50 Units total) into the skin at bedtime. 07/11/23   Dani Gobble, NP  levofloxacin (LEVAQUIN) 500 MG tablet Take 1 tablet (500 mg total) by mouth daily. 10/21/23   Eber Hong, MD  levothyroxine (SYNTHROID) 88 MCG tablet Take 88 mcg by mouth daily before breakfast.    [provider]  Menthol-Zinc Oxide (CALMOSEPTINE) 0.44-20.6 % OINT Apply 1 Application topically as needed (preservation/protection after incontinent care).    [provider]  Multiple Vitamins-Minerals (MULTIVITAMIN WITH MINERALS) tablet Take 1 tablet by mouth daily.    [provider]  MYRBETRIQ 50 MG TB24 tablet Take 1 tablet (50 mg total) by mouth daily. 08/04/23   Donnita Falls, FNP  NOVOLOG 100 UNIT/ML injection Inject 10-16 Units into the skin 3 (three) times daily with meals. 90-150= 10 units; 151-200= 11 units; 201-250= 12 units; 251-300= 13 units; 301-350= 14 units; 351-400= 15 units; above 400 = 16 units 07/11/23   Dani Gobble, NP  nystatin cream (MYCOSTATIN) Apply 1 Application topically 2 (two) times daily. 07/05/23   [provider]  omeprazole (PRILOSEC) 20 MG capsule Take 20 mg by mouth daily.    [provider]  ondansetron (ZOFRAN) 4 MG tablet Take 8 mg by mouth 2 (two) times daily. 03/30/23   [provider]  potassium chloride SA (K-DUR) 20 MEQ tablet Take 20 mEq by mouth daily.    [provider]  PROCRIT 4000 UNIT/ML injection Inject 4,000 Units into the skin every Monday, Wednesday, and Friday. Hold if hemoglobin is > 10 08/01/22   [provider]  propranolol (INDERAL) 80 MG tablet Take 80 mg by mouth daily.    [provider]  saccharomyces boulardii (FLORASTOR) 250 MG capsule Take 250 mg by mouth 2 (two) times daily.    [provider]  SANTYL 250 UNIT/GM ointment Apply 1 Application topically daily. 05/08/23   [provider]  sertraline (ZOLOFT) 100 MG tablet Take 100 mg by mouth daily. 03/31/23   [provider]  trimethoprim (TRIMPEX) 100 MG tablet Take 1 tablet (100 mg total) by mouth daily. 09/09/23   Donnita Falls, FNP    Physical Exam: Vitals:   11/12/23 0948 11/12/23 1130 11/12/23 1136 11/12/23 1230  BP: 122/69   120/61  Pulse: (!) 54 (!) 55  (!) 59  Resp: 15 14  14   Temp: (!) 93.3 F (34.1 C)  (!) 93.6 F (34.2 C)   TempSrc: Rectal  Rectal   SpO2: 99% 96%  98%  Weight:   131 kg    General; She wake up to voice.  Protecting her airway.  CVS; S 1, S  2 RRR Respiratory: Normal respiratory effort, BL air air movement, no wheezing.  Abdomen: BS present, soft, obese Extremities: redness blister left le,  right heel with rash, open wound Neuro: Alert, moves all 4 extremities Skin; rash, multiples round lesion, redness.   Data Reviewed:  Labs reviewed.   Assessment and Plan: No notes have been filed under this hospital service. Service: Hospitalist  1-Acute metabolic encephalopathy, in the setting of hypoglycemia -Patient was found to have a CBG of 25.  This was treated with glucose and glucagon  -CT head: No acute intracranial abnormalities.  Chronic microvascular disease. -ABG: pH compensated at 7.3, pCO2 60 baseline 58 pO2 73 -Admit to the stepdown unit.  Check CBG every 2 hours.  Hold insulin. -Neuro check and CBG Q 2 hours.   2-Pneumonia Acute on Chronic hypoxic, hypercapnic respiratory failure -Presents with Hypothermia, Leukocytosis WBC 18, chest x ray with Low volumes with perihilar opacities right greater than left.  -Started on Vancomycin and Aztreonam.  -BIPAP, HS and if she takes naps.  -stable on 3 L.  -Hold Inhaler, start duo-neb, Brovana and Pulmicort.  -Follow Blood culture.   Morbid obesity.  Needs weight loss.   Diabetes with hypoglycemia -Hold insulin -Monitor Q 2 hours. She was on Lantus 50 units and SSI.   LE cellulitis, Left LE with Wound.  -X ray left LE -Wound care consulted.  -Continue with IV antibiotics.   Rash, back and thigh; Start Eucerin and triamcinolone.  On IV antibiotics.   Intertrigo candidiasis, pubic area.  Start Nystatin Powder   HTN; Hold Norvasc. PRN Hydralazine for now.   Hypothermia; could be setting infection, hypoglycemia.  Bear Huger.    Advance Care Planning:   Code Status: Prior Husband report DNR. Patient now wants to be Full code. Order change.   Consults: None  Family Communication: Husband over phone  Severity of  Illness: The appropriate patient status for this patient is INPATIENT. Inpatient status is judged to be reasonable and necessary in order to provide the required intensity of service to ensure the patient's safety. The patient's presenting symptoms, physical exam findings, and initial radiographic and laboratory data in the context of their chronic comorbidities is felt to place them at high risk for further clinical deterioration. Furthermore, it is not anticipated that the patient will be medically stable for discharge from the hospital within 2 midnights of admission.   * I certify that at the point of admission it is my clinical judgment that the patient will require inpatient hospital care spanning beyond 2 midnights from the point of admission due to high intensity of service, high risk for further deterioration and high frequency of surveillance required.*  Author: Alba Cory, MD 11/12/2023 12:48 PM  For on call review [email or web address].

## 2023-11-12 NOTE — ED Notes (Signed)
Pt needs ultrasound IV. Waiting to start second medication.

## 2023-11-12 NOTE — ED Triage Notes (Signed)
Pt BIB [facility] from [facility]. Pt BS 25 at nursing home, glucagon 2mg  was administered. BS 127 on arrival to ED.

## 2023-11-12 NOTE — Progress Notes (Addendum)
Pharmacy Antibiotic Note  [name] is a 62 y.o. female admitted on 11/12/2023 with pneumonia.  Pharmacy has been consulted for Vancomycin and Aztreonamdosing.  Plan: Vancomycin 2000 mg IV loading dose then 1500mg  IV Q 48 hrs. Goal AUC 400-550. Expected AUC: 430 SCr used: 1.63  Aztreonam 2gm IV in ED, then 1gm IV q8h F/U cxs and clinical progress Monitor V/S, labs and levels as indicated  Weight: 131 kg (288 lb 12.8 oz)  Temp (24hrs), Avg:93.5 F (34.2 C), Min:93.3 F (34.1 C), Max:93.6 F (34.2 C)  Recent Labs  Lab 11/12/23 0955 11/12/23 1029  WBC 18.6*  --   CREATININE 1.63*  --   LATICACIDVEN  --  0.5    Estimated Creatinine Clearance: 45.8 mL/min (A) (by C-G formula based on SCr of 1.63 mg/dL (H)).    Allergies  Allergen Reactions   Carvedilol Palpitations   Benicar [Olmesartan] Swelling   Codeine Other (See Comments)    Confusion    Sulfa Antibiotics Swelling    Whole face swells   Trulicity [Dulaglutide] Diarrhea   Ceftriaxone Hives and Rash    Antimicrobials this admission: Vancomycin 12/7 >>  Aztreonam 12/7 >>   Microbiology results: 12/7 BCx: pending  MRSA PCR:   Thank you for allowing pharmacy to be a part of this patient's care.  Elder Cyphers, BS Pharm D, BCPS Clinical Pharmacist 11/12/2023 11:48 AM

## 2023-11-12 NOTE — ED Provider Notes (Signed)
I provided a substantive portion of the care of this patient.  I personally made/approved the management plan for this patient and take responsibility for the patient management.  EKG Interpretation Date/Time:  Saturday November 12 2023 16:10:96 EST Ventricular Rate:  50 PR Interval:  150 QRS Duration:  133 QT Interval:  565 QTC Calculation: 516 R Axis:   -64  Text Interpretation: Sinus rhythm Nonspecific IVCD with LAD Left ventricular hypertrophy Anterior Q waves, possibly due to LVH No significant change since last tracing Heart rate slower Confirmed by Vanetta Mulders [phone]) on 11/12/2023 9:54:38 AM  CRITICAL CARE Performed by: Vanetta Mulders Total critical care time: 45 minutes Critical care time was exclusive of separately billable procedures and treating other patients. Critical care was necessary to treat or prevent imminent or life-threatening deterioration. Critical care was time spent personally by me on the following activities: development of treatment plan with patient and/or surrogate as well as nursing, discussions with consultants, evaluation of patient's response to treatment, examination of patient, obtaining history from patient or surrogate, ordering and performing treatments and interventions, ordering and review of laboratory studies, ordering and review of radiographic studies, pulse oximetry and re-evaluation of patient's condition.  Patient seen by me along with physician assistant.  Patient came in from [place].  They discovered that her blood sugar was 25.  Patient also hypothermic on arrival with a temp of 93.3.  Upon arrival patient not very responsive.  But seem to be protecting her airway.  Heart rate was 53 blood pressure 122/69 oxygen sats were 99%.  Patient was admitted by me in July for acute respiratory failure was on BiPAP at that time.  Patient now will awake to voice and will stick her tongue out.  Denies any complaints of pain.  Does not seem to be  in any respiratory distress.  Patient's arterial blood gas the pH was 7.39 pCO2 60 which is not too far off from baseline for her she is normally 58 and pO2 down a little bit at 73.  Urinalysis not consistent with urinary tract infection CBC white count is elevated at 18.6.  BMP is 91 lactic acid reassuring at 0.5.  Complete metabolic panel GFR is 35 creatinine 1.63 BUN 25 will have to compare to see if that significantly different.  Blood sugar after receiving glucagon 2 mg it came up to 127 and that is what we got here.  Portable chest x-ray was reviewed by me and raise concerns for a hilar infiltrate on the right side radiologist thinks that there is low volumes by a higher opacities right greater than left.  Based on this we will start antibiotics were awaiting head CT results.  Patient not currently meeting sepsis criteria other than the hypoglycemia and her temp was low.  Otherwise vital signs not consistent with that.  Have ordered blood cultures and have ordered antibiotics.      Vanetta Mulders, MD 11/12/23 [phone]

## 2023-11-12 NOTE — ED Notes (Addendum)
Pt has multiple wounds on lower body (heels, bilaterally on legs, bottom, pelvic region, and lower back) . There is no active bleeding all open to air.

## 2023-11-13 DIAGNOSIS — J189 Pneumonia, unspecified organism: Secondary | ICD-10-CM

## 2023-11-13 DIAGNOSIS — G9340 Encephalopathy, unspecified: Secondary | ICD-10-CM

## 2023-11-13 LAB — GLUCOSE, CAPILLARY
Glucose-Capillary: 173 mg/dL — ABNORMAL HIGH (ref 70–99)
Glucose-Capillary: 179 mg/dL — ABNORMAL HIGH (ref 70–99)
Glucose-Capillary: 202 mg/dL — ABNORMAL HIGH (ref 70–99)
Glucose-Capillary: 308 mg/dL — ABNORMAL HIGH (ref 70–99)
Glucose-Capillary: 459 mg/dL — ABNORMAL HIGH (ref 70–99)
Glucose-Capillary: 362 mg/dL — ABNORMAL HIGH (ref 70–99)
Glucose-Capillary: 296 mg/dL — ABNORMAL HIGH (ref 70–99)

## 2023-11-13 LAB — HEMOGLOBIN A1C
Hgb A1c MFr Bld: 8.2 % — ABNORMAL HIGH (ref 4.8–5.6)
Mean Plasma Glucose: 188.64 mg/dL

## 2023-11-13 MED ORDER — ATORVASTATIN CALCIUM 10 MG PO TABS
10.0000 mg | ORAL_TABLET | Freq: Every day | ORAL | Status: DC
  Administered 2023-11-13 – 2023-11-15 (×3): 10 mg via ORAL
  Filled 2023-11-13 (×3): qty 1

## 2023-11-13 MED ORDER — AMLODIPINE BESYLATE 5 MG PO TABS
10.0000 mg | ORAL_TABLET | Freq: Every day | ORAL | Status: DC
  Administered 2023-11-13 – 2023-11-16 (×4): 10 mg via ORAL
  Filled 2023-11-13 (×4): qty 2

## 2023-11-13 MED ORDER — FLUCONAZOLE 100 MG PO TABS
100.0000 mg | ORAL_TABLET | Freq: Every day | ORAL | Status: DC
  Administered 2023-11-13 – 2023-11-16 (×4): 100 mg via ORAL
  Filled 2023-11-13 (×4): qty 1

## 2023-11-13 MED ORDER — IPRATROPIUM-ALBUTEROL 0.5-2.5 (3) MG/3ML IN SOLN
3.0000 mL | Freq: Three times a day (TID) | RESPIRATORY_TRACT | Status: DC
  Administered 2023-11-14 – 2023-11-16 (×8): 3 mL via RESPIRATORY_TRACT
  Filled 2023-11-13 (×8): qty 3

## 2023-11-13 MED ORDER — HYDRALAZINE HCL 25 MG PO TABS
25.0000 mg | ORAL_TABLET | Freq: Three times a day (TID) | ORAL | Status: DC
  Administered 2023-11-13 – 2023-11-16 (×9): 25 mg via ORAL
  Filled 2023-11-13 (×9): qty 1

## 2023-11-13 MED ORDER — INSULIN GLARGINE-YFGN 100 UNIT/ML ~~LOC~~ SOLN
22.0000 [IU] | Freq: Every day | SUBCUTANEOUS | Status: DC
  Administered 2023-11-13 – 2023-11-14 (×2): 22 [IU] via SUBCUTANEOUS
  Filled 2023-11-13 (×3): qty 0.22

## 2023-11-13 MED ORDER — ASPIRIN 81 MG PO TBEC
81.0000 mg | DELAYED_RELEASE_TABLET | Freq: Every day | ORAL | Status: DC
  Administered 2023-11-13 – 2023-11-16 (×4): 81 mg via ORAL
  Filled 2023-11-13 (×4): qty 1

## 2023-11-13 MED ORDER — ORAL CARE MOUTH RINSE: 15.0000 mL | OROMUCOSAL | Status: DC | PRN

## 2023-11-13 MED ORDER — PANTOPRAZOLE SODIUM 40 MG PO TBEC
40.0000 mg | DELAYED_RELEASE_TABLET | Freq: Every day | ORAL | Status: DC
  Administered 2023-11-13 – 2023-11-16 (×4): 40 mg via ORAL
  Filled 2023-11-13 (×4): qty 1

## 2023-11-13 MED ORDER — SERTRALINE HCL 50 MG PO TABS
100.0000 mg | ORAL_TABLET | Freq: Every day | ORAL | Status: DC
Start: 2023-11-13 — End: 2023-11-16
  Administered 2023-11-13 – 2023-11-16 (×4): 100 mg via ORAL
  Filled 2023-11-13 (×4): qty 2

## 2023-11-13 MED ORDER — INSULIN ASPART 100 UNIT/ML IJ SOLN
0.0000 [IU] | Freq: Every day | INTRAMUSCULAR | Status: DC
  Administered 2023-11-13: 3 [IU] via SUBCUTANEOUS
  Administered 2023-11-14: 4 [IU] via SUBCUTANEOUS

## 2023-11-13 MED ORDER — INSULIN ASPART 100 UNIT/ML IJ SOLN
0.0000 [IU] | Freq: Three times a day (TID) | INTRAMUSCULAR | Status: DC
  Administered 2023-11-13: 9 [IU] via SUBCUTANEOUS
  Administered 2023-11-13: 7 [IU] via SUBCUTANEOUS
  Administered 2023-11-14: 2 [IU] via SUBCUTANEOUS
  Administered 2023-11-14 (×2): 5 [IU] via SUBCUTANEOUS
  Administered 2023-11-15: 2 [IU] via SUBCUTANEOUS
  Administered 2023-11-15 (×2): 3 [IU] via SUBCUTANEOUS

## 2023-11-13 MED ORDER — MUPIROCIN 2 % EX OINT
1.0000 | TOPICAL_OINTMENT | Freq: Two times a day (BID) | CUTANEOUS | Status: DC
  Administered 2023-11-13 – 2023-11-16 (×6): 1 via NASAL
  Filled 2023-11-13: qty 22

## 2023-11-13 MED ORDER — SODIUM CHLORIDE 0.9 % IV SOLN: INTRAVENOUS | Status: AC | PRN

## 2023-11-13 MED ORDER — PROPRANOLOL HCL 20 MG PO TABS
80.0000 mg | ORAL_TABLET | Freq: Every day | ORAL | Status: DC
Start: 2023-11-13 — End: 2023-11-16
  Administered 2023-11-13 – 2023-11-16 (×4): 80 mg via ORAL
  Filled 2023-11-13 (×4): qty 4

## 2023-11-13 NOTE — Consult Note (Addendum)
WOC Nurse Consult Note: Reason for Consult:right foot/heel wound Incidentally noted to have macular rash over her buttocks and posterior thighs, noted to be pruritic in nursing notes. Reported to have started since patient left SNF in last 24 hours. MD aware. Droplet precautions initiated  Wound type: Stage 3 Pressure Injury Pressure Injury POA: Yes Measurement:see nursing flow sheet Wound bed:100% clean Drainage (amount, consistency, odor) none documented Periwound: hyperkeratotic  Dressing procedure/placement/frequency: Cleanse right heel wound with saline, pat dry Apply hydrogel to the wound bed Top with dry dressing Change daily   Discussed POC with patient and bedside nurse.  Re consult if needed, will not follow at this time. Thanks  Neeley Sedivy [employer], RN,CWOCN, CNS, CWON-[facility] [phone])

## 2023-11-13 NOTE — Progress Notes (Signed)
   11/13/23 1756  TOC Brief Assessment  Insurance and Status Reviewed  Patient has primary care physician Yes  Home environment has been reviewed From [facility]  Prior level of function: Need Assistance  Prior/Current Home Services Current home services (SNF)  Readmission risk has been reviewed Yes  Transition of care needs transition of care needs identified, TOC will continue to follow (DC expected to SNF, TOC to follow)

## 2023-11-13 NOTE — Plan of Care (Addendum)
Patient remains on AP-2B at time of writing. Patient is still requiring 3 L / min of supplemental O2 via [place]. Patient is MRSA positive; however, upon change of shift, the MRSA positive standing orders had not been initiated. MRSA positive standing orders initiated by this RN per protocol. Patient has a documented diagnosis of CHF; however, from a chart review it is not obvious the patient is followed by HF or Cardiology; consult placed to HF navigation team per standing orders. Education assessment completed with this patient by this RN overnight; education assessment had not been previously completed for this encounter. The patient has a wound to right heel with orders for wound care. The supplies linked to the wound care are not available at this time: this matter was escalated to the [facility], Tim, who stated he had looked for the supplies earlier today and could not locate them; he further stated "They will have to get them tomorrow". At present the patient has a non-adherent, pressure-reducing foam to the right heel wound. Patient remains in Airborne/contact isolation pending results of varicella-zoster PCR. The patient is receiving IV antibiotics; however, the patient's IV tubing was found to be unlabeled and luer-locked back into itself, so the carrier fluid bag and IV tubing were discarded and new supplies were utilized overnight. SCDs were obtained and applied as ordered, which had not been addressed previously. Patient has been appropriately scored as a "high risk for falls; however, inadequate prevention measures were in place upon change of shift. Fall prevention bundle implemented by this RN including a yellow arm band and bed alarm. Adult oral care protocol initiated by this RN, per protocol. Prevalon boot which had previously been was ordered was obtained and placed on patient by this RN overnight as well. The patient wore her CPAP for only 80 minutes overnight stating it "gave me a headache".       Problem: Education: Goal: Knowledge of General Education information will improve Description: Including pain rating scale, medication(s)/side effects and non-pharmacologic comfort measures Outcome: Progressing   Problem: Health Behavior/Discharge Planning: Goal: Ability to manage health-related needs will improve Outcome: Progressing   Problem: Clinical Measurements: Goal: Ability to maintain clinical measurements within normal limits will improve Outcome: Progressing Goal: Will remain free from infection Outcome: Progressing Goal: Diagnostic test results will improve Outcome: Progressing Goal: Respiratory complications will improve Outcome: Progressing Goal: Cardiovascular complication will be avoided Outcome: Progressing   Problem: Activity: Goal: Risk for activity intolerance will decrease Outcome: Progressing   Problem: Nutrition: Goal: Adequate nutrition will be maintained Outcome: Progressing   Problem: Coping: Goal: Level of anxiety will decrease Outcome: Progressing   Problem: Elimination: Goal: Will not experience complications related to bowel motility Outcome: Progressing Goal: Will not experience complications related to urinary retention Outcome: Progressing   Problem: Pain Management: Goal: General experience of comfort will improve Outcome: Progressing   Problem: Safety: Goal: Ability to remain free from injury will improve Outcome: Progressing   Problem: Skin Integrity: Goal: Risk for impaired skin integrity will decrease Outcome: Progressing   Problem: Education: Goal: Ability to describe self-care measures that may prevent or decrease complications (Diabetes Survival Skills Education) will improve Outcome: Progressing Goal: Individualized Educational Video(s) Outcome: Progressing   Problem: Coping: Goal: Ability to adjust to condition or change in health will improve Outcome: Progressing   Problem: Fluid Volume: Goal: Ability  to maintain a balanced intake and output will improve Outcome: Progressing   Problem: Health Behavior/Discharge Planning: Goal: Ability to identify and utilize available resources  and services will improve Outcome: Progressing Goal: Ability to manage health-related needs will improve Outcome: Progressing   Problem: Metabolic: Goal: Ability to maintain appropriate glucose levels will improve Outcome: Progressing   Problem: Nutritional: Goal: Maintenance of adequate nutrition will improve Outcome: Progressing Goal: Progress toward achieving an optimal weight will improve Outcome: Progressing   Problem: Skin Integrity: Goal: Risk for impaired skin integrity will decrease Outcome: Progressing   Problem: Tissue Perfusion: Goal: Adequacy of tissue perfusion will improve Outcome: Progressing

## 2023-11-13 NOTE — Progress Notes (Signed)
Progress Note   Patient: [name]:[id number] DOB: February 17, 1961 DOA: 11/12/2023     1 DOS: the patient was seen and examined on 11/13/2023   Brief hospital admission course: As per H&P written by Dr. Sunnie Nielsen on 11/12/2023 [name] is a 62 y.o. female with medical history significant of COPD on 2 to 3 L of oxygen, diabetes, hypertension, CKD, chronic diastolic heart failure, morbid obesity BMI of 50, last hospitalization in July treated for acute on chronic hypoxic respiratory failure secondary to pneumonia haemophilus influenza bacteremia presents from nursing [facility]  due to altered mental status.  Patient was found at the facility unresponsive, CBG was 25, she received glucose.  He was transferred to the ED for further evaluation.    Evaluation in the ED, patient was responding initially to sternal rub now respond to voice.  Received glucagon.  CBG 110s.  She was found to be hypothermic temperature 93, leukocytosis, stable on 3 L of oxygen, protecting airway.  Chest x-ray with low volumes with perihilar opacity right greater than left.  She was started on IV antibiotics.   On my evaluation, patient wake up to voice, remain alert for conversation. She was able to tell me she was at [facility], and her blood sugar was low. She denies cough, chest pain, shortness of breath, abdominal pain, nausea vomiting.  She is noted to have a rash on her back and her thigh and pubic area.  She complained of itching.  She also has a right heel ulcer, redness on her right and left extremity.  Assessment and Plan: 1-acute metabolic encephalopathy in the setting of hypoglycemia -Resolved and with mentation back to baseline -Continue to closely follow CBGs fluctuation and adjust hypoglycemic regimen as needed. -Patient advised not to skip any meals -Updated A1c 8.2  2-acute on chronic respiratory failure with hypoxia -ABG also demonstrating the presence of hypercapnia -There is  bilateral opacities -Continue empirical antibiotics for pneumonia (healthcare associated pneumonia). -Continue bronchodilator management -Mucolytics/flutter valve -Continue the use of BiPAP nightly. -Patient body habitus supporting concern for sleep apnea.  3-intertrigo candidiasis/disseminated body rash -Pictures has been taken -Wound care has been consulted -Continue treatment with Diflucan and topical nystatin -Will keep area clean and dry; patient advised not to scratch herself.  4-lower extremity cellulitis/right heel wound -No acute drainage appreciated -Current antibiotics will also cover for any cellulitic process -Wound care service has been consulted.  5-hypertension -Resume home antihypertensive agents -Follow vital signs -Heart healthy/low-sodium diet discussed with patient.  6-hypothermia -Appears to be in the setting of hypoglycemia -Patient initially received Bair hugger -After stabilization of her CBGs no further temperature abnormalities appreciated.  7-type 2 diabetes -Presenting with hypoglycemic event -Continue to follow CBG fluctuation -Continue sliding scale insulin and adjusted dose of Semglee.  8-morbid obesity -Body mass index is 52.82 kg/m.  -Low-calorie diet, portion control and increase full activity discussed with patient.  Subjective:  No chest pain, no nausea or vomiting.  Overall feeling better.  Expressed breathing is pretty much improved and getting close to baseline.  Patient expressed diffuse itching on her skin.  Physical Exam: Vitals:   11/13/23 1552 11/13/23 1608 11/13/23 1700 11/13/23 1733  BP:   (!) 173/61 (!) 173/61  Pulse:   73 82  Resp:   14   Temp: 98.3 F (36.8 C)     TempSrc: Oral     SpO2:  98% 100%   Weight:      Height:  General exam: Alert, awake, oriented x 3; no fever, no chest pain. Respiratory system: Positive rhonchi bilaterally; no using accessory muscles.  Normal respiratory effort. Cardiovascular  system:RRR. No murmurs, rubs, gallops. Gastrointestinal system: Abdomen is nondistended, soft and nontender. No organomegaly or masses felt. Normal bowel sounds heard. Central nervous system:  No focal neurological deficits. Extremities: No cyanosis or clubbing; trace edema appreciated bilaterally. Skin: Diffuse rash and intertrigo appreciated; right lower extremity with heel ulcer.  Please refer to epic media images for further details.  Patient's skin changes present at time of admission. Psychiatry: Judgement and insight appear normal. Mood & affect appropriate.   Data Reviewed: A1c 8.2 CBGs: 308 >> 459 >> 362.  Family Communication: No family at bedside.  Disposition: Status is: Inpatient Remains inpatient appropriate because: Continue IV antibiotics.   Planned Discharge Destination: Skilled nursing facility    Time spent: 50 minutes  Author: Vassie Loll, MD 11/13/2023 5:46 PM  For on call review [email or web address].

## 2023-11-13 NOTE — Plan of Care (Signed)
  Problem: Education: Goal: Knowledge of General Education information will improve Description: Including pain rating scale, medication(s)/side effects and non-pharmacologic comfort measures Outcome: Progressing   Problem: Health Behavior/Discharge Planning: Goal: Ability to manage health-related needs will improve Outcome: Progressing   Problem: Clinical Measurements: Goal: Ability to maintain clinical measurements within normal limits will improve Outcome: Not Progressing Goal: Will remain free from infection Outcome: Progressing Goal: Diagnostic test results will improve Outcome: Progressing Goal: Respiratory complications will improve Outcome: Progressing Goal: Cardiovascular complication will be avoided Outcome: Progressing   Problem: Activity: Goal: Risk for activity intolerance will decrease Outcome: Not Progressing   Problem: Nutrition: Goal: Adequate nutrition will be maintained Outcome: Progressing   Problem: Coping: Goal: Level of anxiety will decrease Outcome: Progressing   Problem: Elimination: Goal: Will not experience complications related to bowel motility Outcome: Progressing Goal: Will not experience complications related to urinary retention Outcome: Progressing   Problem: Pain Management: Goal: General experience of comfort will improve Outcome: Progressing   Problem: Safety: Goal: Ability to remain free from injury will improve Outcome: Progressing   Problem: Skin Integrity: Goal: Risk for impaired skin integrity will decrease Outcome: Not Progressing   Problem: Education: Goal: Ability to describe self-care measures that may prevent or decrease complications (Diabetes Survival Skills Education) will improve Outcome: Progressing Goal: Individualized Educational Video(s) Outcome: Progressing   Problem: Coping: Goal: Ability to adjust to condition or change in health will improve Outcome: Progressing   Problem: Fluid Volume: Goal:  Ability to maintain a balanced intake and output will improve Outcome: Progressing   Problem: Health Behavior/Discharge Planning: Goal: Ability to identify and utilize available resources and services will improve Outcome: Progressing Goal: Ability to manage health-related needs will improve Outcome: Progressing   Problem: Metabolic: Goal: Ability to maintain appropriate glucose levels will improve Outcome: Progressing

## 2023-11-14 ENCOUNTER — Ambulatory Visit (HOSPITAL_COMMUNITY): Payer: Medicare HMO

## 2023-11-14 ENCOUNTER — Inpatient Hospital Stay: Payer: Medicare HMO

## 2023-11-14 DIAGNOSIS — J189 Pneumonia, unspecified organism: Secondary | ICD-10-CM | POA: Diagnosis not present

## 2023-11-14 DIAGNOSIS — G9340 Encephalopathy, unspecified: Secondary | ICD-10-CM | POA: Diagnosis not present

## 2023-11-14 LAB — GLUCOSE, CAPILLARY
Glucose-Capillary: 188 mg/dL — ABNORMAL HIGH (ref 70–99)
Glucose-Capillary: 265 mg/dL — ABNORMAL HIGH (ref 70–99)
Glucose-Capillary: 273 mg/dL — ABNORMAL HIGH (ref 70–99)
Glucose-Capillary: 301 mg/dL — ABNORMAL HIGH (ref 70–99)

## 2023-11-14 NOTE — Progress Notes (Signed)
Progress Note   Patient: [name] MVH:[id number] DOB: 12-06-1961 DOA: 11/12/2023     2 DOS: the patient was seen and examined on 11/14/2023   Brief hospital admission course: As per H&P written by Dr. Sunnie Nielsen on 11/12/2023 [name] is a 62 y.o. female with medical history significant of COPD on 2 to 3 L of oxygen, diabetes, hypertension, CKD, chronic diastolic heart failure, morbid obesity BMI of 50, last hospitalization in July treated for acute on chronic hypoxic respiratory failure secondary to pneumonia haemophilus influenza bacteremia presents from nursing [facility]  due to altered mental status.  Patient was found at the facility unresponsive, CBG was 25, she received glucose.  He was transferred to the ED for further evaluation.    Evaluation in the ED, patient was responding initially to sternal rub now respond to voice.  Received glucagon.  CBG 110s.  She was found to be hypothermic temperature 93, leukocytosis, stable on 3 L of oxygen, protecting airway.  Chest x-ray with low volumes with perihilar opacity right greater than left.  She was started on IV antibiotics.   On my evaluation, patient wake up to voice, remain alert for conversation. She was able to tell me she was at [facility], and her blood sugar was low. She denies cough, chest pain, shortness of breath, abdominal pain, nausea vomiting.  She is noted to have a rash on her back and her thigh and pubic area.  She complained of itching.  She also has a right heel ulcer, redness on her right and left extremity.  Assessment and Plan: 1-acute metabolic encephalopathy in the setting of hypoglycemia -Continue supportive care. -Continue to closely follow CBGs fluctuation and adjust hypoglycemic regimen as needed. -Patient advised not to skip any meals -Updated A1c 8.2 -Patient mentation back to baseline.  2-acute on chronic respiratory failure with hypoxia -ABG also demonstrating the presence of  hypercapnia -There is bilateral opacities -Continue empirical antibiotics for pneumonia (healthcare associated pneumonia). -Continue bronchodilator management and follow clinical response. -Continue current antibiotic therapy. -Continue the use of mucolytics/flutter valve -Continue the use of BiPAP nightly. -Patient body habitus supporting concern for sleep apnea.  3-intertrigo candidiasis/disseminated body rash -Pictures has been taken -Appreciate assistance and recommendation by wound care service -Continue treatment with Diflucan and topical nystatin -Will keep area clean and dry; patient advised not to scratch herself. -Important to mention that per patient's reports and further discussion with the skilled nursing facility patient is skin rash has been present for the last at least 2 weeks prior to admission.  4-lower extremity cellulitis/right heel wound -No acute drainage appreciated -Continue current antibiotics will also cover for any cellulitic process -Wound care service assistance and recommendation appreciated.  5-hypertension -Resume home antihypertensive agents -Follow vital signs -Heart healthy/low-sodium diet discussed with patient.  6-hypothermia -Appears to be in the setting of hypoglycemia -Patient initially received Bair hugger -After stabilization of her CBGs no further temperature abnormalities appreciated. -Continue to follow vital signs.  7-type 2 diabetes -Presenting with hypoglycemic event -Continue to follow CBG fluctuation and further adjust hypoglycemic regimen as needed. -Continue sliding scale insulin and adjusted dose of Semglee.  8-morbid obesity -Body mass index is 52.82 kg/m.  -Low-calorie diet, portion control and increase full activity discussed with patient.  9-physical deconditioning/generalized weakness -PT/OT evaluation has been requested.  Subjective:  No chest pain, no nausea or vomiting.  Overall feeling better.  Expressed  breathing is pretty much improved and getting close to baseline.  Patient  expressed diffuse itching on her skin.  Physical Exam: Vitals:   11/14/23 1417 11/14/23 1500 11/14/23 1600 11/14/23 1700  BP: (!) 158/61 (!) 134/54 (!) 112/55 (!) 157/55  Pulse: (!) 57 (!) 58 (!) 57 (!) 59  Resp:  15 13 14   Temp:  97.9 F (36.6 C)    TempSrc:  Oral    SpO2:   97% 100%  Weight:      Height:       General exam: Alert, awake, oriented x 3; no fever, no chest pain, no nausea or vomiting.  Reports feeling better and having slightly less itching in her skin lesions. Respiratory system: Positive rhonchi bilaterally; no wheezing, no crackles.  No using accessory muscle.  Good saturation on chronic supplementation. Cardiovascular system:RRR. No rubs or gallops; unable to properly assess JVD due to body habitus. Gastrointestinal system: Abdomen is obese, nondistended, soft and nontender. No organomegaly or masses felt. Normal bowel sounds heard. Central nervous system: Generally weak.  No focal neurological deficits. Extremities: No cyanosis or clubbing; trace edema appreciated bilaterally. Skin: No petechiae; disseminated maculopapular rash on her back posterior thighs bilaterally and in some aspects of her legs.  Patient also with fungal dermatitis changes on the right breast, abdominal folds and groin area. Psychiatry: Judgement and insight appear normal. Mood & affect appropriate.   Latest data Reviewed: A1c 8.2 CBGs: 308 >> 459 >> 362 >>265>> 188 Basic metabolic panel: Pending  Family Communication: No family at bedside.  Disposition: Status is: Inpatient Remains inpatient appropriate because: Continue IV antibiotics.   Planned Discharge Destination: Skilled nursing facility    Time spent: 50 minutes  Author: Vassie Loll, MD 11/14/2023 5:38 PM  For on call review [email or web address].

## 2023-11-14 NOTE — Care Management Important Message (Signed)
Important Message  Patient Details  Name: [name] MRN: [id number] Date of Birth: [birth year]   Important Message Given:  N/A - LOS <3 / Initial given by admissions     Corey Harold 11/14/2023, 12:09 PM

## 2023-11-14 NOTE — Progress Notes (Signed)
Asked patient about using Bipap for tonight.  Patient stated that she does not use at home and would rather not use it tonight.

## 2023-11-14 NOTE — Plan of Care (Signed)

## 2023-11-14 NOTE — TOC Initial Note (Signed)
Transition of Care [facility]) - Initial/Assessment Note    Patient Details  Name: [name] MRN: [id number] Date of Birth: 1961-09-15  Transition of Care [facility]) CM/SW Contact:    Elliot Gault, LCSW Phone Number: 11/14/2023, 11:58 AM  Clinical Narrative:                  Pt admitted from [facility]. She is high risk for readmission. [facility] responsible for appointment transportation and providing medications. Logan at [facility] updated today. She requests PT eval to determine if pt would benefit from SNF rehab short term at dc. Updated MD.  Anticipating dc in 2-3 days. TOC will follow and continue to assess and assist as needed.  Expected Discharge Plan: Long Term Nursing Home Barriers to Discharge: Continued Medical Work up   Patient Goals and CMS Choice Patient states their goals for this hospitalization and ongoing recovery are:: get better          Expected Discharge Plan and Services In-house Referral: Clinical Social Work     Living arrangements for the past 2 months: Skilled Nursing Facility                                      Prior Living Arrangements/Services Living arrangements for the past 2 months: Skilled Nursing Facility Lives with:: Facility Resident Patient language and need for interpreter reviewed:: Yes Do you feel safe going back to the place where you live?: Yes      Need for Family Participation in Patient Care: No (Comment) Care giver support system in place?: Yes (comment)   Criminal Activity/Legal Involvement Pertinent to Current Situation/Hospitalization: No - Comment as needed  Activities of Daily Living   ADL Screening (condition at time of admission) Independently performs ADLs?: No Does the patient have a NEW difficulty with bathing/dressing/toileting/self-feeding that is expected to last >3 days?: No Does the patient have a NEW difficulty with getting in/out of bed, walking, or climbing stairs  that is expected to last >3 days?: No Does the patient have a NEW difficulty with communication that is expected to last >3 days?: No Is the patient deaf or have difficulty hearing?: No Does the patient have difficulty seeing, even when wearing glasses/contacts?: No Does the patient have difficulty concentrating, remembering, or making decisions?: Yes  Permission Sought/Granted                  Emotional Assessment       Orientation: : Oriented to Self, Oriented to Place, Oriented to  Time, Oriented to Situation Alcohol / Substance Use: Not Applicable Psych Involvement: No (comment)  Admission diagnosis:  Encephalopathy acute [G93.40] Healthcare-associated pneumonia [J18.9] Patient Active Problem List   Diagnosis Date Noted   Encephalopathy acute 11/12/2023   Acquired renal cyst of right kidney 08/04/2023   Mixed stress and urge incontinence 08/04/2023   Ambulatory dysfunction 08/04/2023   Urinary urgency 08/04/2023   Urge incontinence 08/04/2023   Urinary frequency 08/04/2023   Cognitive communication deficit 08/04/2023   OAB (overactive bladder) 08/02/2023   Recurrent UTI 08/02/2023   Left carpal tunnel syndrome 05/27/2023   Right carpal tunnel syndrome 05/27/2023   Polyneuropathy associated with underlying disease (HCC) 09/20/2022   Sensorineural hearing loss (SNHL) of both ears 09/20/2022   Colon cancer (HCC) 08/11/2022   Cancer of sigmoid colon (HCC) 05/12/2022   Iron deficiency anemia    Pressure  injury of skin 11/24/2021   Chronic venous stasis 11/22/2021   Leukocytosis 11/22/2021   Thrombocytosis 11/22/2021   Hyperglycemia due to diabetes mellitus (HCC) 11/22/2021   Lactic acidosis 11/22/2021   Hypoalbuminemia due to protein-calorie malnutrition (HCC) 11/22/2021   Elevated liver enzymes 11/22/2021   Mixed hyperlipidemia 11/22/2021   GERD (gastroesophageal reflux disease) 11/22/2021   Acute on chronic diastolic CHF (congestive heart failure) (HCC)  11/22/2021   Diabetic foot infection (HCC) 11/22/2021   Uncontrolled type 2 diabetes mellitus with hyperglycemia, with long-term current use of insulin (HCC) 11/22/2021   Lower extremity cellulitis 11/21/2021   Class 3 severe obesity with serious comorbidity and body mass index (BMI) of 50.0 to 59.9 in adult (HCC) 07/10/2021   Morbid obesity with BMI of 50.0-59.9, adult (HCC) 01/29/2020   OSA (obstructive sleep apnea) 12/21/2019   Asthma 12/21/2019   Current moderate episode of major depressive disorder (HCC) 01/16/2019   Stress incontinence of urine 01/16/2019   Irritable bowel syndrome with diarrhea 02/21/2018   Uterine prolapse 08/09/2017   Acquired hypothyroidism 09/27/2016   Anxiety, generalized 12/03/2015   Hypertension associated with diabetes (HCC) 08/26/2015   Diastolic dysfunction 05/15/2015   Prolonged QT interval 05/14/2015   Palpitations 07/23/2014   Generalized weakness 07/23/2014   Essential hypertension 07/22/2014   COPD (chronic obstructive pulmonary disease) (HCC) 07/22/2014   Depression 07/22/2014   PCP:  [name], NP Pharmacy:   [facility] [place], [place] - [address] [place] [address] Phone: [phone] Fax: [phone]  [facility] - [place], [place] - [address] [place] [address] Phone: [phone] Fax: [phone]     Social Determinants of Health (SDOH) Social History: SDOH Screenings   Food Insecurity: No Food Insecurity (11/12/2023)  Housing: Low Risk  (11/12/2023)  Transportation Needs: No Transportation Needs (11/12/2023)  Utilities: Not At Risk (11/12/2023)  Financial Resource Strain: Medium Risk (08/14/2021)   Received from [facility]  Physical Activity: Inactive (08/14/2021)   Received from [facility]  Social Connections: Unknown (04/08/2022)   Received from [facility], [facility]  Stress: Stress  Concern Present (08/14/2021)   Received from [facility], [facility]  Tobacco Use: Medium Risk (11/12/2023)   SDOH Interventions:     Readmission Risk Interventions    11/14/2023   11:51 AM 06/27/2023    2:47 PM 12/17/2021   11:29 AM  Readmission Risk Prevention Plan  Transportation Screening Complete Complete Complete  Home Care Screening   Complete  Medication Review (RN CM)   Complete  HRI or Home Care Consult Complete Complete   Social Work Consult for Recovery Care Planning/Counseling Complete Complete   Palliative Care Screening Not Applicable Not Applicable   Medication Review Oceanographer) Complete Complete

## 2023-11-14 NOTE — [place] FL2 (Signed)
[place] MEDICAID FL2 LEVEL OF CARE FORM     IDENTIFICATION  Patient Name: [name] Birthdate: 05-Jul-1961 Sex: female Admission Date (Current Location): 11/12/2023  [facility] and [place] Number:  [employer] and Address:  [facility],  [address], [place] [address]      Provider Number: [id number]  Attending Physician Name and Address:  Vassie Loll, MD  Relative Name and Phone Number:       Current Level of Care: Hospital Recommended Level of Care: Skilled Nursing Facility Prior Approval Number:    Date Approved/Denied:   PASRR Number:    Discharge Plan: SNF    Current Diagnoses: Patient Active Problem List   Diagnosis Date Noted   Encephalopathy acute 11/12/2023   Acquired renal cyst of right kidney 08/04/2023   Mixed stress and urge incontinence 08/04/2023   Ambulatory dysfunction 08/04/2023   Urinary urgency 08/04/2023   Urge incontinence 08/04/2023   Urinary frequency 08/04/2023   Cognitive communication deficit 08/04/2023   OAB (overactive bladder) 08/02/2023   Recurrent UTI 08/02/2023   Left carpal tunnel syndrome 05/27/2023   Right carpal tunnel syndrome 05/27/2023   Polyneuropathy associated with underlying disease (HCC) 09/20/2022   Sensorineural hearing loss (SNHL) of both ears 09/20/2022   Colon cancer (HCC) 08/11/2022   Cancer of sigmoid colon (HCC) 05/12/2022   Iron deficiency anemia    Pressure injury of skin 11/24/2021   Chronic venous stasis 11/22/2021   Leukocytosis 11/22/2021   Thrombocytosis 11/22/2021   Hyperglycemia due to diabetes mellitus (HCC) 11/22/2021   Lactic acidosis 11/22/2021   Hypoalbuminemia due to protein-calorie malnutrition (HCC) 11/22/2021   Elevated liver enzymes 11/22/2021   Mixed hyperlipidemia 11/22/2021   GERD (gastroesophageal reflux disease) 11/22/2021   Acute on chronic diastolic CHF (congestive heart failure) (HCC) 11/22/2021   Diabetic foot infection (HCC) 11/22/2021    Uncontrolled type 2 diabetes mellitus with hyperglycemia, with long-term current use of insulin (HCC) 11/22/2021   Lower extremity cellulitis 11/21/2021   Class 3 severe obesity with serious comorbidity and body mass index (BMI) of 50.0 to 59.9 in adult (HCC) 07/10/2021   Morbid obesity with BMI of 50.0-59.9, adult (HCC) 01/29/2020   OSA (obstructive sleep apnea) 12/21/2019   Asthma 12/21/2019   Current moderate episode of major depressive disorder (HCC) 01/16/2019   Stress incontinence of urine 01/16/2019   Irritable bowel syndrome with diarrhea 02/21/2018   Uterine prolapse 08/09/2017   Acquired hypothyroidism 09/27/2016   Anxiety, generalized 12/03/2015   Hypertension associated with diabetes (HCC) 08/26/2015   Diastolic dysfunction 05/15/2015   Prolonged QT interval 05/14/2015   Palpitations 07/23/2014   Generalized weakness 07/23/2014   Essential hypertension 07/22/2014   COPD (chronic obstructive pulmonary disease) (HCC) 07/22/2014   Depression 07/22/2014    Orientation RESPIRATION BLADDER Height & Weight     Self, Time, Situation, Place  Normal Incontinent Weight: 279 lb 8.7 oz (126.8 kg) Height:  5\' 1"  (154.9 cm)  BEHAVIORAL SYMPTOMS/MOOD NEUROLOGICAL BOWEL NUTRITION STATUS      Incontinent Diet (Regular)  AMBULATORY STATUS COMMUNICATION OF NEEDS Skin   Extensive Assist Verbally PU Stage and Appropriate Care (R heel)                       Personal Care Assistance Level of Assistance  Bathing, Feeding, Dressing, Total care Bathing Assistance: Maximum assistance Feeding assistance: Independent Dressing Assistance: Maximum assistance Total Care Assistance: Maximum assistance   Functional Limitations Info  Sight, Hearing, Speech Sight  Info: Adequate Hearing Info: Adequate Speech Info: Adequate    SPECIAL CARE FACTORS FREQUENCY  PT (By licensed PT), OT (By licensed OT)     PT Frequency: 5x week OT Frequency: 5x week            Contractures Contractures  Info: Not present    Additional Factors Info  Code Status, Allergies Code Status Info: Full Allergies Info: Carvedilol, Benicar (Olmesartan), Codeine, Sulfa Antibiotics, Trulicity (Dulaglutide), Ceftriaxone           Current Medications (11/14/2023):  This is the current hospital active medication list Current Facility-Administered Medications  Medication Dose Route Frequency Provider Last Rate Last Admin   0.9 %  sodium chloride infusion   Intravenous PRN Vassie Loll, MD 10 mL/hr at 11/14/23 0600 Infusion Verify at 11/14/23 0600   acetaminophen (TYLENOL) tablet 650 mg  650 mg Oral Q6H PRN Regalado, Belkys A, MD       Or   acetaminophen (TYLENOL) suppository 650 mg  650 mg Rectal Q6H PRN Regalado, Belkys A, MD       albuterol (PROVENTIL) (2.5 MG/3ML) 0.083% nebulizer solution 2.5 mg  2.5 mg Nebulization Q2H PRN Regalado, Belkys A, MD       amLODipine (NORVASC) tablet 10 mg  10 mg Oral Daily Vassie Loll, MD   10 mg at 11/14/23 0819   arformoterol (BROVANA) nebulizer solution 15 mcg  15 mcg Nebulization BID Regalado, Belkys A, MD   15 mcg at 11/14/23 0754   aspirin EC tablet 81 mg  81 mg Oral Daily Vassie Loll, MD   81 mg at 11/14/23 0818   atorvastatin (LIPITOR) tablet 10 mg  10 mg Oral QHS Vassie Loll, MD   10 mg at 11/13/23 2132   aztreonam (AZACTAM) 1 g in sodium chloride 0.9 % 100 mL IVPB  1 g Intravenous Q8H Regalado, Belkys A, MD 200 mL/hr at 11/14/23 0607 1 g at 11/14/23 0607   bisacodyl (DULCOLAX) EC tablet 5 mg  5 mg Oral Daily PRN Regalado, Belkys A, MD       budesonide (PULMICORT) nebulizer solution 0.25 mg  0.25 mg Nebulization BID Regalado, Belkys A, MD   0.25 mg at 11/14/23 0754   Chlorhexidine Gluconate Cloth 2 % PADS 6 each  6 each Topical Daily Regalado, Belkys A, MD   6 each at 11/14/23 0958   enoxaparin (LOVENOX) injection 60 mg  60 mg Subcutaneous Q24H Regalado, Belkys A, MD   60 mg at 11/13/23 2131   fluconazole (DIFLUCAN) tablet 100 mg  100 mg Oral Daily  Vassie Loll, MD   100 mg at 11/14/23 0818   hydrALAZINE (APRESOLINE) injection 5 mg  5 mg Intravenous Q6H PRN Regalado, Belkys A, MD   5 mg at 11/13/23 2047   hydrALAZINE (APRESOLINE) tablet 25 mg  25 mg Oral Q8H Vassie Loll, MD   25 mg at 11/14/23 1610   insulin aspart (novoLOG) injection 0-5 Units  0-5 Units Subcutaneous QHS Vassie Loll, MD   3 Units at 11/13/23 2115   insulin aspart (novoLOG) injection 0-9 Units  0-9 Units Subcutaneous TID WC Vassie Loll, MD   5 Units at 11/14/23 1201   insulin glargine-yfgn (SEMGLEE) injection 22 Units  22 Units Subcutaneous QHS Vassie Loll, MD   22 Units at 11/13/23 2208   ipratropium-albuterol (DUONEB) 0.5-2.5 (3) MG/3ML nebulizer solution 3 mL  3 mL Nebulization TID Vassie Loll, MD   3 mL at 11/14/23 0754   mupirocin ointment (BACTROBAN) 2 % 1 Application  1 Application Nasal BID Vassie Loll, MD   1 Application at 11/14/23 0827   nystatin (MYCOSTATIN/NYSTOP) topical powder   Topical TID Alba Cory, MD   Given at 11/14/23 1610   nystatin-triamcinolone (MYCOLOG II) cream   Topical BID Regalado, Belkys A, MD   1 Application at 11/14/23 0956   Oral care mouth rinse  15 mL Mouth Rinse PRN Vassie Loll, MD       pantoprazole (PROTONIX) EC tablet 40 mg  40 mg Oral Daily Vassie Loll, MD   40 mg at 11/14/23 0819   propranolol (INDERAL) tablet 80 mg  80 mg Oral Daily Vassie Loll, MD   80 mg at 11/14/23 9604   senna-docusate (Senokot-S) tablet 1 tablet  1 tablet Oral QHS PRN Regalado, Belkys A, MD       sertraline (ZOLOFT) tablet 100 mg  100 mg Oral Daily Vassie Loll, MD   100 mg at 11/14/23 0818   sodium chloride flush (NS) 0.9 % injection 3 mL  3 mL Intravenous Q12H Regalado, Belkys A, MD   3 mL at 11/14/23 0837   sodium chloride flush (NS) 0.9 % injection 3 mL  3 mL Intravenous PRN Regalado, Belkys A, MD       vancomycin (VANCOREADY) IVPB 1500 mg/300 mL  1,500 mg Intravenous Q48H Regalado, Belkys A, MD         Discharge  Medications: Please see discharge summary for a list of discharge medications.  Relevant Imaging Results:  Relevant Lab Results:   Additional Information    Elliot Gault, LCSW

## 2023-11-15 ENCOUNTER — Ambulatory Visit (HOSPITAL_BASED_OUTPATIENT_CLINIC_OR_DEPARTMENT_OTHER): Payer: Medicare HMO | Admitting: Internal Medicine

## 2023-11-15 DIAGNOSIS — J189 Pneumonia, unspecified organism: Secondary | ICD-10-CM | POA: Diagnosis not present

## 2023-11-15 DIAGNOSIS — G9340 Encephalopathy, unspecified: Secondary | ICD-10-CM | POA: Diagnosis not present

## 2023-11-15 LAB — BASIC METABOLIC PANEL
Anion gap: 7 (ref 5–15)
BUN: 23 mg/dL (ref 8–23)
CO2: 30 mmol/L (ref 22–32)
Calcium: 9.2 mg/dL (ref 8.9–10.3)
Chloride: 102 mmol/L (ref 98–111)
Creatinine, Ser: 1.44 mg/dL — ABNORMAL HIGH (ref 0.44–1.00)
GFR, Estimated: 41 mL/min — ABNORMAL LOW (ref >60)
Glucose, Bld: 244 mg/dL — ABNORMAL HIGH (ref 70–99)
Potassium: 3.9 mmol/L (ref 3.5–5.1)
Sodium: 139 mmol/L (ref 135–145)

## 2023-11-15 LAB — GLUCOSE, CAPILLARY
Glucose-Capillary: 225 mg/dL — ABNORMAL HIGH (ref 70–99)
Glucose-Capillary: 224 mg/dL — ABNORMAL HIGH (ref 70–99)
Glucose-Capillary: 181 mg/dL — ABNORMAL HIGH (ref 70–99)
Glucose-Capillary: 172 mg/dL — ABNORMAL HIGH (ref 70–99)

## 2023-11-15 LAB — VARICELLA-ZOSTER BY PCR: Varicella-Zoster, PCR: NEGATIVE

## 2023-11-15 MED ORDER — AZTREONAM 1 G IJ SOLR
INTRAMUSCULAR | Status: AC
  Filled 2023-11-15: qty 5

## 2023-11-15 MED ORDER — INSULIN GLARGINE-YFGN 100 UNIT/ML ~~LOC~~ SOLN
25.0000 [IU] | Freq: Every day | SUBCUTANEOUS | Status: DC
  Administered 2023-11-15: 25 [IU] via SUBCUTANEOUS
  Filled 2023-11-15 (×2): qty 0.25

## 2023-11-15 MED ORDER — INSULIN ASPART 100 UNIT/ML IJ SOLN
3.0000 [IU] | Freq: Three times a day (TID) | INTRAMUSCULAR | Status: DC
  Administered 2023-11-16: 3 [IU] via SUBCUTANEOUS

## 2023-11-15 NOTE — Progress Notes (Signed)
Progress Note   Patient: [name] ZOX:[id number] DOB: [birth year] DOA: 11/12/2023     3 DOS: the patient was seen and examined on 11/15/2023   Brief hospital admission course: As per H&P written by Dr. Sunnie Nielsen on 11/12/2023 [name] is a 62 y.o. female with medical history significant of COPD on 2 to 3 L of oxygen, diabetes, hypertension, CKD, chronic diastolic heart failure, morbid obesity BMI of 50, last hospitalization in July treated for acute on chronic hypoxic respiratory failure secondary to pneumonia haemophilus influenza bacteremia presents from nursing home [facility]  due to altered mental status.  Patient was found at the facility unresponsive, CBG was 25, she received glucose.  He was transferred to the ED for further evaluation.    Evaluation in the ED, patient was responding initially to sternal rub now respond to voice.  Received glucagon.  CBG 110s.  She was found to be hypothermic temperature 93, leukocytosis, stable on 3 L of oxygen, protecting airway.  Chest x-ray with low volumes with perihilar opacity right greater than left.  She was started on IV antibiotics.   On my evaluation, patient wake up to voice, remain alert for conversation. She was able to tell me she was at [facility], and her blood sugar was low. She denies cough, chest pain, shortness of breath, abdominal pain, nausea vomiting.  She is noted to have a rash on her back and her thigh and pubic area.  She complained of itching.  She also has a right heel ulcer, redness on her right and left extremity.  Assessment and Plan: 1-acute metabolic encephalopathy in the setting of hypoglycemia -Continue supportive care. -Continue to closely follow CBGs fluctuation and adjust hypoglycemic regimen as needed. -Patient advised not to skip any meals -Updated A1c 8.2 -Patient mentation back to baseline.  2-acute on chronic respiratory failure with hypoxia -ABG also demonstrating the presence of  hypercapnia -There is bilateral opacities -Continue empirical antibiotics for pneumonia (healthcare associated pneumonia). -Continue bronchodilator management and follow clinical response. -Continue current antibiotic therapy. -Continue the use of mucolytics/flutter valve -Continue the use of BiPAP nightly. -Patient body habitus supporting concern for sleep apnea.  3-intertrigo candidiasis/disseminated body rash -Pictures has been taken -Appreciate assistance and recommendation by wound care service -Continue treatment with Diflucan and topical nystatin -Will keep area clean and dry; patient advised not to scratch herself. -Important to mention that per patient's reports and further discussion with the skilled nursing facility patient is skin rash has been present for the last at least 2 weeks prior to admission.  4-lower extremity cellulitis/right heel wound -No acute drainage appreciated -Continue current antibiotics will also cover for any cellulitic process -Wound care service assistance and recommendation appreciated.  5-hypertension -Resume home antihypertensive agents -Follow vital signs -Heart healthy/low-sodium diet discussed with patient.  6-hypothermia -Appears to be in the setting of hypoglycemia -Patient initially received Bair hugger -After stabilization of her CBGs no further temperature abnormalities appreciated. -Continue to follow vital signs.  7-type 2 diabetes -Presenting with hypoglycemic event -Continue to follow CBG fluctuation and further adjust hypoglycemic regimen as needed. -Continue sliding scale insulin and adjusted dose of Semglee.  8-morbid obesity -Body mass index is 52.82 kg/m.  -Low-calorie diet, portion control and increase full activity discussed with patient.  9-physical deconditioning/generalized weakness -PT/OT evaluation has been requested. -Will follow recommendations, to pursued rehab at the skilled nursing facility.  Subjective:   No fever, no chest pain, no nausea, no vomiting.  Overall feeling better.  Physical Exam: Vitals:   11/15/23 0721 11/15/23 0729 11/15/23 1331 11/15/23 1419  BP:    (!) 158/72  Pulse:    (!) 59  Resp:    16  Temp:    98.1 F (36.7 C)  TempSrc:    Oral  SpO2: 91% 91% 95% 98%  Weight:      Height:       General exam: Alert, awake, oriented x 3; no fever, no chest pain, no nausea or vomiting.  Reports feeling much better, having less itching in her skin and noticing just intermittent nonproductive coughing spells. Respiratory system: Positive Bilaterally.  No using accessory muscle.  Good saturation on chronic supplementation. Cardiovascular system:RRR.  No rubs, no gallops, unable to properly assess JVD with body habitus. Gastrointestinal system: Abdomen is obese, nondistended, soft and nontender. No organomegaly or masses felt. Normal bowel sounds heard. Central nervous system:  No focal neurological deficits. Extremities: No cyanosis or clubbing; trace edema appreciated bilaterally. Skin: No petechiae; maculopapular rash demonstrating less erythematous changes affecting her back and posterior aspect of her thighs and leg.  Intertrigo/fungal dermatitis under her breasts, groin and abdominal skin folds.  Patient also with right heel stage II ulcer without active drainage currently.  Present at time of admission. Psychiatry: Judgement and insight appear normal. Mood & affect appropriate.   Latest data Reviewed: A1c 8.2 CBGs: 308 >> 459 >> 362 >>265>> 188 Basic metabolic panel: Sodium 139, potassium 3.9, chloride 102, bicarb 30, BUN 23, creatinine 1.44 and GFR 41.  Family Communication: No family at bedside.  Disposition: Status is: Inpatient Remains inpatient appropriate because: Continue IV antibiotics for another 24 hours and continue adjusting hypoglycemic agents..   Planned Discharge Destination: Skilled nursing facility  Time spent: 50 minutes  Author: Vassie Loll,  MD 11/15/2023 6:27 PM  For on call review [email or web address].

## 2023-11-15 NOTE — Progress Notes (Signed)
Pharmacy Antibiotic Note  [name] is a 62 y.o. female admitted on 11/12/2023 with pneumonia.  Pharmacy has been consulted for Vancomycin and Aztreonam dosing.  Patient currently afebrile. Scr down slightly. No new culture data, blood is no growth to date.   Plan: Continue Vancomycin 1500mg  IV Q 48 hrs, next dose 12/11 Goal AUC 400-550. Expected AUC: ~400 SCr used: 1.44 Aztreonam 1gm IV q8h F/U cxs and clinical progress  Height: 5\' 1"  (154.9 cm) Weight: 126.8 kg (279 lb 8.7 oz) IBW/kg (Calculated) : 47.8  Temp (24hrs), Avg:98.1 F (36.7 C), Min:97.8 F (36.6 C), Max:98.6 F (37 C)  Recent Labs  Lab 11/12/23 0955 11/12/23 1029 11/12/23 1131 11/15/23 0419  WBC 18.6*  --   --   --   CREATININE 1.63*  --   --  1.44*  LATICACIDVEN  --  0.5 0.5  --     Estimated Creatinine Clearance: 50.8 mL/min (A) (by C-G formula based on SCr of 1.44 mg/dL (H)).    Allergies  Allergen Reactions   Carvedilol Palpitations   Benicar [Olmesartan] Swelling   Codeine Other (See Comments)    Confusion    Sulfa Antibiotics Swelling    Whole face swells   Trulicity [Dulaglutide] Diarrhea   Ceftriaxone Hives and Rash    Antimicrobials this admission: Vancomycin 12/7 >>  Aztreonam 12/7 >>   Microbiology results: 12/7 BCx: ngtd  MRSA PCR: positive  Thank you for allowing pharmacy to be a part of this patient's care.  Sheppard Coil PharmD., BCPS Clinical Pharmacist 11/15/2023 11:51 AM c

## 2023-11-15 NOTE — Plan of Care (Signed)

## 2023-11-15 NOTE — Inpatient Diabetes Management (Signed)
Inpatient Diabetes Program Recommendations  AACE/ADA: New Consensus Statement on Inpatient Glycemic Control   Target Ranges:  Prepandial:   less than 140 mg/dL      Peak postprandial:   less than 180 mg/dL (1-2 hours)      Critically ill patients:  140 - 180 mg/dL    Latest Reference Range & Units 11/14/23 07:47 11/14/23 11:36 11/14/23 15:53 11/14/23 23:06 11/15/23 07:56  Glucose-Capillary 70 - 99 mg/dL 161 (H) 096 (H) 045 (H) 301 (H) 225 (H)    Review of Glycemic Control  Diabetes history: DM2 Outpatient Diabetes medications: Lantus 55 units at bedtime, Novolog 10-16 units TID with meals Current orders for Inpatient glycemic control: Semglee 22 units at bedtime, Novolog 0-9 units TID with meals, Novolog 0-5 units QHS  Inpatient Diabetes Program Recommendations:    Insulin: Please consider increasing Semglee to 24 units at bedtime and adding Novolog 4 units TID with meals for meal coverage if patient eats at least 50% of meals.  Outpatient DM medications: Will likely need to consider decreasing outpatient dose of Lantus at time of discharge.  Thanks, Orlando Penner, RN, MSN, CDCES Diabetes Coordinator Inpatient Diabetes Program [phone] (Team Pager from 8am to 5pm)

## 2023-11-15 NOTE — Progress Notes (Signed)
Patient is still refusing Bipap.

## 2023-11-16 DIAGNOSIS — E162 Hypoglycemia, unspecified: Secondary | ICD-10-CM | POA: Diagnosis not present

## 2023-11-16 DIAGNOSIS — I10 Essential (primary) hypertension: Secondary | ICD-10-CM | POA: Diagnosis not present

## 2023-11-16 DIAGNOSIS — Z79899 Other long term (current) drug therapy: Secondary | ICD-10-CM | POA: Diagnosis not present

## 2023-11-16 DIAGNOSIS — G5603 Carpal tunnel syndrome, bilateral upper limbs: Secondary | ICD-10-CM | POA: Diagnosis not present

## 2023-11-16 DIAGNOSIS — E10621 Type 1 diabetes mellitus with foot ulcer: Secondary | ICD-10-CM | POA: Diagnosis not present

## 2023-11-16 DIAGNOSIS — J9622 Acute and chronic respiratory failure with hypercapnia: Secondary | ICD-10-CM | POA: Diagnosis not present

## 2023-11-16 DIAGNOSIS — E161 Other hypoglycemia: Secondary | ICD-10-CM | POA: Diagnosis not present

## 2023-11-16 DIAGNOSIS — I890 Lymphedema, not elsewhere classified: Secondary | ICD-10-CM | POA: Diagnosis not present

## 2023-11-16 DIAGNOSIS — D649 Anemia, unspecified: Secondary | ICD-10-CM | POA: Diagnosis not present

## 2023-11-16 DIAGNOSIS — I959 Hypotension, unspecified: Secondary | ICD-10-CM | POA: Diagnosis not present

## 2023-11-16 DIAGNOSIS — E11649 Type 2 diabetes mellitus with hypoglycemia without coma: Secondary | ICD-10-CM | POA: Diagnosis not present

## 2023-11-16 DIAGNOSIS — L03115 Cellulitis of right lower limb: Secondary | ICD-10-CM | POA: Diagnosis not present

## 2023-11-16 DIAGNOSIS — I110 Hypertensive heart disease with heart failure: Secondary | ICD-10-CM | POA: Diagnosis not present

## 2023-11-16 DIAGNOSIS — G9341 Metabolic encephalopathy: Secondary | ICD-10-CM | POA: Diagnosis not present

## 2023-11-16 DIAGNOSIS — E039 Hypothyroidism, unspecified: Secondary | ICD-10-CM | POA: Diagnosis not present

## 2023-11-16 DIAGNOSIS — J9621 Acute and chronic respiratory failure with hypoxia: Secondary | ICD-10-CM | POA: Diagnosis not present

## 2023-11-16 DIAGNOSIS — Z6841 Body Mass Index (BMI) 40.0 and over, adult: Secondary | ICD-10-CM | POA: Diagnosis not present

## 2023-11-16 DIAGNOSIS — J189 Pneumonia, unspecified organism: Secondary | ICD-10-CM | POA: Diagnosis not present

## 2023-11-16 DIAGNOSIS — B372 Candidiasis of skin and nail: Secondary | ICD-10-CM | POA: Diagnosis not present

## 2023-11-16 DIAGNOSIS — R404 Transient alteration of awareness: Secondary | ICD-10-CM | POA: Diagnosis not present

## 2023-11-16 DIAGNOSIS — R55 Syncope and collapse: Secondary | ICD-10-CM | POA: Diagnosis not present

## 2023-11-16 DIAGNOSIS — E118 Type 2 diabetes mellitus with unspecified complications: Secondary | ICD-10-CM | POA: Diagnosis not present

## 2023-11-16 DIAGNOSIS — E876 Hypokalemia: Secondary | ICD-10-CM | POA: Diagnosis not present

## 2023-11-16 DIAGNOSIS — L89613 Pressure ulcer of right heel, stage 3: Secondary | ICD-10-CM | POA: Diagnosis not present

## 2023-11-16 DIAGNOSIS — J181 Lobar pneumonia, unspecified organism: Secondary | ICD-10-CM | POA: Diagnosis not present

## 2023-11-16 DIAGNOSIS — E559 Vitamin D deficiency, unspecified: Secondary | ICD-10-CM | POA: Diagnosis not present

## 2023-11-16 DIAGNOSIS — I872 Venous insufficiency (chronic) (peripheral): Secondary | ICD-10-CM | POA: Diagnosis not present

## 2023-11-16 DIAGNOSIS — Z794 Long term (current) use of insulin: Secondary | ICD-10-CM | POA: Diagnosis not present

## 2023-11-16 DIAGNOSIS — L97412 Non-pressure chronic ulcer of right heel and midfoot with fat layer exposed: Secondary | ICD-10-CM | POA: Diagnosis not present

## 2023-11-16 DIAGNOSIS — N1832 Chronic kidney disease, stage 3b: Secondary | ICD-10-CM | POA: Diagnosis not present

## 2023-11-16 DIAGNOSIS — L089 Local infection of the skin and subcutaneous tissue, unspecified: Secondary | ICD-10-CM | POA: Diagnosis not present

## 2023-11-16 DIAGNOSIS — I130 Hypertensive heart and chronic kidney disease with heart failure and stage 1 through stage 4 chronic kidney disease, or unspecified chronic kidney disease: Secondary | ICD-10-CM | POA: Diagnosis not present

## 2023-11-16 DIAGNOSIS — L97411 Non-pressure chronic ulcer of right heel and midfoot limited to breakdown of skin: Secondary | ICD-10-CM | POA: Diagnosis not present

## 2023-11-16 DIAGNOSIS — G9340 Encephalopathy, unspecified: Secondary | ICD-10-CM | POA: Diagnosis not present

## 2023-11-16 DIAGNOSIS — J440 Chronic obstructive pulmonary disease with acute lower respiratory infection: Secondary | ICD-10-CM | POA: Diagnosis not present

## 2023-11-16 DIAGNOSIS — E785 Hyperlipidemia, unspecified: Secondary | ICD-10-CM | POA: Diagnosis not present

## 2023-11-16 DIAGNOSIS — G4733 Obstructive sleep apnea (adult) (pediatric): Secondary | ICD-10-CM | POA: Diagnosis not present

## 2023-11-16 DIAGNOSIS — D631 Anemia in chronic kidney disease: Secondary | ICD-10-CM | POA: Diagnosis not present

## 2023-11-16 DIAGNOSIS — I5032 Chronic diastolic (congestive) heart failure: Secondary | ICD-10-CM | POA: Diagnosis not present

## 2023-11-16 DIAGNOSIS — E1122 Type 2 diabetes mellitus with diabetic chronic kidney disease: Secondary | ICD-10-CM | POA: Diagnosis not present

## 2023-11-16 DIAGNOSIS — L304 Erythema intertrigo: Secondary | ICD-10-CM | POA: Diagnosis not present

## 2023-11-16 DIAGNOSIS — E11621 Type 2 diabetes mellitus with foot ulcer: Secondary | ICD-10-CM | POA: Diagnosis not present

## 2023-11-16 DIAGNOSIS — J449 Chronic obstructive pulmonary disease, unspecified: Secondary | ICD-10-CM | POA: Diagnosis not present

## 2023-11-16 LAB — CBC
HCT: 43.1 % (ref 36.0–46.0)
Hemoglobin: 13.6 g/dL (ref 12.0–15.0)
MCH: 27.5 pg (ref 26.0–34.0)
MCHC: 31.6 g/dL (ref 30.0–36.0)
MCV: 87.1 fL (ref 80.0–100.0)
Platelets: 237 10*3/uL (ref 150–400)
RBC: 4.95 MIL/uL (ref 3.87–5.11)
RDW: 15.8 % — ABNORMAL HIGH (ref 11.5–15.5)
WBC: 8.9 10*3/uL (ref 4.0–10.5)
nRBC: 0 % (ref 0.0–0.2)

## 2023-11-16 LAB — GLUCOSE, CAPILLARY
Glucose-Capillary: 102 mg/dL — ABNORMAL HIGH (ref 70–99)
Glucose-Capillary: 75 mg/dL (ref 70–99)

## 2023-11-16 LAB — TROPONIN I (HIGH SENSITIVITY)
Troponin I (High Sensitivity): 5 ng/L (ref <18)
Troponin I (High Sensitivity): 5 ng/L (ref <18)

## 2023-11-16 MED ORDER — DOXYCYCLINE HYCLATE 100 MG PO TABS: 100.0000 mg | ORAL_TABLET | Freq: Two times a day (BID) | ORAL | Status: DC

## 2023-11-16 MED ORDER — LANTUS 100 UNIT/ML ~~LOC~~ SOLN: 40.0000 [IU] | Freq: Every day | SUBCUTANEOUS | Status: DC

## 2023-11-16 MED ORDER — DOXYCYCLINE HYCLATE 100 MG PO TABS
100.0000 mg | ORAL_TABLET | Freq: Two times a day (BID) | ORAL | Status: DC
  Administered 2023-11-16: 100 mg via ORAL
  Filled 2023-11-16: qty 1

## 2023-11-16 MED ORDER — FLUCONAZOLE 100 MG PO TABS: 100.0000 mg | ORAL_TABLET | Freq: Every day | ORAL | Status: DC

## 2023-11-16 MED ORDER — AMOXICILLIN-POT CLAVULANATE 875-125 MG PO TABS
1.0000 | ORAL_TABLET | Freq: Two times a day (BID) | ORAL | Status: DC
  Administered 2023-11-16: 1 via ORAL
  Filled 2023-11-16: qty 1

## 2023-11-16 MED ORDER — AMOXICILLIN-POT CLAVULANATE 875-125 MG PO TABS: 1.0000 | ORAL_TABLET | Freq: Two times a day (BID) | ORAL | Status: DC

## 2023-11-16 NOTE — Evaluation (Signed)
Occupational Therapy Evaluation Patient Details Name: [name] MRN: [id number] DOB: July 06, 1961 Today's Date: 11/16/2023   History of Present Illness is a 62 y.o. female with medical history significant of COPD on 2 to 3 L of oxygen, diabetes, hypertension, CKD, chronic diastolic heart failure, morbid obesity BMI of 50, last hospitalization in July treated for acute on chronic hypoxic respiratory failure secondary to pneumonia haemophilus influenza bacteremia presents from nursing home [facility]  due to altered mental status.  Patient was found at the facility unresponsive, CBG was 25, she received glucose.  He was transferred to the ED for further evaluation. Evaluation in the ED, patient was responding initially to sternal rub now respond to voice.  Received glucagon.  CBG 110s.  She was found to be hypothermic temperature 93, leukocytosis, stable on 3 L of oxygen, protecting airway.  Chest x-ray with low volumes with perihilar opacity right greater than left.  She was started on IV antibiotics   Clinical Impression   Pt agreeable to OT evaluation, A&Ox4 this am. Pt requiring rest breaks during session due to nausea, improved with breathing techniques. Pt functioning near baseline for ADL completion. Resides at LTC facility with staff assisting with ADLs as needed. Pt requiring min A for bed mobility and CGA for functional mobility today, primarily limited by nausea. No further OT services required at this time. Recommend up with nursing and mobility specialist during hospitalization.        If plan is discharge home, recommend the following: A lot of help with walking and/or transfers;A little help with bathing/dressing/bathroom;Assistance with cooking/housework;Assist for transportation;Help with stairs or ramp for entrance    Functional Status Assessment  Patient has had a recent decline in their functional status and demonstrates the ability to make significant improvements in  function in a reasonable and predictable amount of time.  Equipment Recommendations  None recommended by OT       Precautions / Restrictions Precautions Precautions: Fall Restrictions Weight Bearing Restrictions: No      Mobility Bed Mobility Overal bed mobility: Needs Assistance Bed Mobility: Supine to Sit     Supine to sit: Min assist, HOB elevated     General bed mobility comments: one hand assist along with use of bedrail to come to sitting    Transfers Overall transfer level: Needs assistance Equipment used: Rolling walker (2 wheels) Transfers: Sit to/from Stand Sit to Stand: Min assist                      ADL either performed or assessed with clinical judgement   ADL Overall ADL's : Needs assistance/impaired;At baseline Eating/Feeding: Modified independent;Sitting   Grooming: Set up;Sitting               Lower Body Dressing: Maximal assistance;Sitting/lateral leans Lower Body Dressing Details (indicate cue type and reason): unable to reach feet at baseline, assists with donning socks and threading legs through undergarments/pants Toilet Transfer: Contact guard assist;Stand-pivot;Rolling walker (2 wheels) Toilet Transfer Details (indicate cue type and reason): simulated with bed to chair transfer           General ADL Comments: Pt near baseline for ADL tasks, mostly limited by nausea during evaluation     Vision Baseline Vision/History: 0 No visual deficits Ability to See in Adequate Light: 0 Adequate Patient Visual Report: No change from baseline Vision Assessment?: No apparent visual deficits            Pertinent Vitals/Pain Pain Assessment Pain  Assessment: No/denies pain     Extremity/Trunk Assessment Upper Extremity Assessment Upper Extremity Assessment: Generalized weakness   Lower Extremity Assessment Lower Extremity Assessment: Defer to PT evaluation   Cervical / Trunk Assessment Cervical / Trunk Assessment: Kyphotic    Communication Communication Communication: No apparent difficulties   Cognition Arousal: Alert Behavior During Therapy: WFL for tasks assessed/performed Overall Cognitive Status: Within Functional Limits for tasks assessed                                                  Home Living Family/patient expects to be discharged to:: Skilled nursing facility                                 Additional Comments: Pt lives at LTC facility, staff assist with ADLs as needed      Prior Functioning/Environment Prior Level of Function : Needs assist;History of Falls (last six months)       Physical Assist : Mobility (physical);ADLs (physical)     Mobility Comments: Uses RW for mobility at baseline ADLs Comments: Pt reports staff assist with ADLs, she is able to stand for showering most of the time        OT Problem List: Decreased strength;Decreased activity tolerance;Impaired balance (sitting and/or standing)       AM-PAC OT "6 Clicks" Daily Activity     Outcome Measure Help from another person eating meals?: None Help from another person taking care of personal grooming?: None Help from another person toileting, which includes using toliet, bedpan, or urinal?: A Little Help from another person bathing (including washing, rinsing, drying)?: A Little Help from another person to put on and taking off regular upper body clothing?: None Help from another person to put on and taking off regular lower body clothing?: A Little 6 Click Score: 21   End of Session Equipment Utilized During Treatment: Gait belt;Rolling walker (2 wheels);Oxygen Nurse Communication: Mobility status;Other (comment) (Purewick off; pt scratched back and is bleeding mildly)  Activity Tolerance: Other (comment) (limited by nausea) Patient left: in chair;with call bell/phone within reach;with chair alarm set  OT Visit Diagnosis: Repeated falls (R29.6);Muscle weakness (generalized)  (M62.81)                Time: 3244-0102 OT Time Calculation (min): 37 min Charges:  OT General Charges $OT Visit: 1 Visit OT Evaluation $OT Eval Low Complexity: 1 Low OT Treatments $Self Care/Home Management : 8-22 mins  Ezra Sites, OTR/L  [phone] 11/16/2023, 9:35 AM

## 2023-11-16 NOTE — Plan of Care (Signed)
  Problem: Education: Goal: Knowledge of General Education information will improve Description: Including pain rating scale, medication(s)/side effects and non-pharmacologic comfort measures Outcome: Adequate for Discharge   Problem: Health Behavior/Discharge Planning: Goal: Ability to manage health-related needs will improve Outcome: Adequate for Discharge   Problem: Clinical Measurements: Goal: Ability to maintain clinical measurements within normal limits will improve Outcome: Adequate for Discharge Goal: Will remain free from infection Outcome: Adequate for Discharge Goal: Diagnostic test results will improve Outcome: Adequate for Discharge Goal: Respiratory complications will improve Outcome: Adequate for Discharge Goal: Cardiovascular complication will be avoided Outcome: Adequate for Discharge   Problem: Activity: Goal: Risk for activity intolerance will decrease Outcome: Adequate for Discharge   Problem: Nutrition: Goal: Adequate nutrition will be maintained Outcome: Adequate for Discharge   Problem: Coping: Goal: Level of anxiety will decrease Outcome: Adequate for Discharge   Problem: Elimination: Goal: Will not experience complications related to bowel motility Outcome: Adequate for Discharge Goal: Will not experience complications related to urinary retention Outcome: Adequate for Discharge   Problem: Pain Management: Goal: General experience of comfort will improve Outcome: Adequate for Discharge   Problem: Safety: Goal: Ability to remain free from injury will improve Outcome: Adequate for Discharge   Problem: Skin Integrity: Goal: Risk for impaired skin integrity will decrease Outcome: Adequate for Discharge   Problem: Education: Goal: Ability to describe self-care measures that may prevent or decrease complications (Diabetes Survival Skills Education) will improve Outcome: Adequate for Discharge Goal: Individualized Educational Video(s) Outcome:  Adequate for Discharge   Problem: Coping: Goal: Ability to adjust to condition or change in health will improve Outcome: Adequate for Discharge   Problem: Fluid Volume: Goal: Ability to maintain a balanced intake and output will improve Outcome: Adequate for Discharge   Problem: Health Behavior/Discharge Planning: Goal: Ability to identify and utilize available resources and services will improve Outcome: Adequate for Discharge Goal: Ability to manage health-related needs will improve Outcome: Adequate for Discharge   Problem: Metabolic: Goal: Ability to maintain appropriate glucose levels will improve Outcome: Adequate for Discharge   Problem: Nutritional: Goal: Maintenance of adequate nutrition will improve Outcome: Adequate for Discharge Goal: Progress toward achieving an optimal weight will improve Outcome: Adequate for Discharge   Problem: Skin Integrity: Goal: Risk for impaired skin integrity will decrease Outcome: Adequate for Discharge   Problem: Tissue Perfusion: Goal: Adequacy of tissue perfusion will improve Outcome: Adequate for Discharge   Problem: Acute Rehab PT Goals(only PT should resolve) Goal: Pt Will Go Supine/Side To Sit Outcome: Adequate for Discharge Goal: Patient Will Perform Sitting Balance Outcome: Adequate for Discharge Goal: Patient Will Transfer Sit To/From Stand Outcome: Adequate for Discharge Goal: Pt Will Ambulate Outcome: Adequate for Discharge

## 2023-11-16 NOTE — Plan of Care (Signed)
  Problem: Acute Rehab PT Goals(only PT should resolve) Goal: Pt Will Go Supine/Side To Sit Flowsheets (Taken 11/16/2023 1053) Pt will go Supine/Side to Sit: Independently Goal: Patient Will Perform Sitting Balance Flowsheets (Taken 11/16/2023 1053) Patient will perform sitting balance: Independently Goal: Patient Will Transfer Sit To/From Stand Flowsheets (Taken 11/16/2023 1053) Patient will transfer sit to/from stand: Independently Goal: Pt Will Ambulate Flowsheets (Taken 11/16/2023 1053) Pt will Ambulate:  50 feet  with modified independence  with rolling walker  Elie Goody, DPT [facility] [place] [phone] office

## 2023-11-16 NOTE — TOC Transition Note (Signed)
Transition of Care [facility]) - CM/SW Discharge Note   Patient Details  Name: [name] MRN: [id number] Date of Birth: [birth year]  [facility]) CM/SW Contact:  Karn Cassis, LCSW Phone Number: 11/16/2023, 1:50 PM   Clinical Narrative:  Pt d/c today back to [facility]. Authorization started and is pending. [facility] at [facility] aware. D/C summary sent to SNF. Pt and pt's husband aware of d/c and husband to transport. RN given number to call report.      Final next level of care: Skilled Nursing Facility Barriers to Discharge: Barriers Resolved   Patient Goals and CMS Choice      Discharge Placement                  Patient to be transferred to facility by: husband Name of family member notified: husband Patient and family notified of of transfer: 11/16/23  Discharge Plan and Services Additional resources added to the After Visit Summary for   In-house Referral: Clinical Social Work                                   Social Determinants of Health (SDOH) Interventions SDOH Screenings   Food Insecurity: No Food Insecurity (11/12/2023)  Housing: Low Risk  (11/12/2023)  Transportation Needs: No Transportation Needs (11/12/2023)  Utilities: Not At Risk (11/12/2023)  Financial Resource Strain: Medium Risk (08/14/2021)   [facility], [facility]  Physical Activity: Inactive (08/14/2021)   Received from [facility], [facility]  Social Connections: Unknown (04/08/2022)   Received from [facility], [facility]  Stress: Stress Concern Present (08/14/2021)   Received from [facility], [facility]  Tobacco Use: Medium Risk (11/12/2023)     Readmission Risk Interventions    11/14/2023   11:51 AM 06/27/2023    2:47 PM 12/17/2021   11:29 AM  Readmission Risk Prevention Plan  Transportation Screening Complete Complete Complete  Home Care Screening   Complete  Medication Review (RN CM)   Complete  HRI or Home  Care Consult Complete Complete   Social Work Consult for Recovery Care Planning/Counseling Complete Complete   Palliative Care Screening Not Applicable Not Applicable   Medication Review Oceanographer) Complete Complete

## 2023-11-16 NOTE — Discharge Summary (Signed)
Physician Discharge Summary   Patient: [name] MRN: [id number] DOB: [birth year]  Admit date:     11/12/2023  Discharge date: 11/16/23  Discharge Physician: Onalee Hua Future [place]   PCP: [name], NP   Recommendations at discharge:   Please follow up with primary care provider within 1-2 weeks  Please repeat BMP and CBC in one week    Hospital Course: As per H&P written by Dr. Sunnie Nielsen on 11/12/2023 [name] is a 62 y.o. female with medical history significant of COPD on 2 to 3 L of oxygen, diabetes, hypertension, CKD, chronic diastolic heart failure, morbid obesity BMI of 50, last hospitalization in July treated for acute on chronic hypoxic respiratory failure secondary to pneumonia haemophilus influenza bacteremia presents [facility]  due to altered mental status.  Patient was found at the facility unresponsive, CBG was 25, she received glucose.  He was transferred to the ED for further evaluation.    Evaluation in the ED, patient was responding initially to sternal rub now respond to voice.  Received glucagon.  CBG 110s.  She was found to be hypothermic temperature 93, leukocytosis, stable on 3 L of oxygen, protecting airway.  Chest x-ray with low volumes with perihilar opacity right greater than left.  She was started on IV antibiotics.   In ED, patient wakes up to voice, remain alert for conversation. She was able to tell me she was at [facility] pen, and her blood sugar was low. She denies cough, chest pain, shortness of breath, abdominal pain, nausea vomiting.  She is noted to have a rash on her back and her thigh and pubic area.  She complained of itching.  She also has a right heel ulcer, redness on her right and left extremity   Assessment and Plan: acute metabolic encephalopathy -due to infectious process and hypoglycemia -hypoglycemia improved -Continue to closely follow CBGs fluctuation and adjust hypoglycemic regimen as needed. -12/8  A1c  8.2 -Patient mentation back to baseline--conversant, A&O x 3   acute on chronic respiratory failure with hypoxia and hypercarbia -ABG 7.39/60/73/37 on 2L -chronically on 2-3 L PTA -personally reviewed CXR--bibasilar opacities -Continue empirical antibiotics for pneumonia (vanc/aztreonam)started 12/7 -Continue bronchodilator management and follow clinical response. -Continue mucolytics/flutter valve -Continue BiPAP nightly. -d/c with 3 more days amox/clav and doxy   intertrigo candidiasis -Appreciate assistance and recommendation by wound care service -Continue treatment with Diflucan and topical nystatin -keep area clean and dry; patient advised not to scratch herself. -Important to mention that per patient's reports and further discussion with the skilled nursing facility patient is skin rash has been present for the last at least 2 weeks prior to admission.   lower extremity cellulitis/right heel wound -No acute drainage or necrosis appreciated -Continue current antibiotics will also cover for any cellulitic process -Wound care service assistance and recommendation appreciated.  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 Ellin Saba -surveillance CTs and CEAs- -08/15/23 CEA 1.8   hypertension -Resume home antihypertensive agents -Follow vital signs -Heart healthy/low-sodium diet discussed with patient.   hypothermia -Appears to be in the setting of hypoglycemia -Patient initially received Bair hugger -After stabilization of her CBGs no further temperature abnormalities appreciated.   type 2 diabetes, uncontrolled with hyperglycemia -Presenting with hypoglycemic event -has been hyperglycemic throughout hospitalization -11/13/23 A1C--8.2 -Continue to follow CBG fluctuation -Continue sliding scale insulin and adjusted dose of  Semglee. -decrease dose of Semglee to 40 units at hs (pt was taking 55 units) -continue novolog sliding scale after d/c   morbid obesity -Body mass index is 52.82 kg/m.  -Low-calorie diet, portion control and increase full activity discussed with patient.  Mixed Hyperlipidemia -continue statin   physical deconditioning/generalized weakness -PT/OT evaluation has been requested.  Atypical chest pain -troponin 5>>5 -personally reviewed EKG--sinus, nonspecific TWI        Consultants: none Procedures performed: none  Disposition: Skilled nursing facility Diet recommendation:  Carb modified diet DISCHARGE MEDICATION: Allergies as of 11/16/2023       Reactions   Carvedilol Palpitations   Benicar [olmesartan] Swelling   Codeine Other (See Comments)   Confusion    Sulfa Antibiotics Swelling   Whole face swells   Trulicity [dulaglutide] Diarrhea   Ceftriaxone Hives, Rash        Medication List     TAKE these medications    acetaminophen 650 MG CR tablet Commonly known as: TYLENOL Take 650 mg by mouth every 8 (eight) hours as needed for pain.   albuterol 108 (90 Base) MCG/ACT inhaler Commonly known as: VENTOLIN HFA Inhale 2 puffs into the lungs every 6 (six) hours as needed for wheezing or shortness of breath.   alum & mag hydroxide-simeth 200-200-20 MG/5ML suspension Commonly known as: MAALOX/MYLANTA Take 30 mLs by mouth every 2 (two) hours as needed for indigestion or heartburn. Do not exceed 4 doses in 24 hours   amLODipine 5 MG tablet Commonly known as: NORVASC Take 10 mg by mouth daily.   amoxicillin-clavulanate 875-125 MG tablet Commonly known as: AUGMENTIN Take 1 tablet by mouth every 12 (twelve) hours. X 3 more days   ascorbic acid 500 MG tablet Commonly known as: VITAMIN C Take 500 mg by mouth 2 (two) times daily.   aspirin EC 81 MG tablet Take 81 mg by mouth daily. Swallow whole.   atorvastatin 10 MG tablet Commonly known as: LIPITOR Take  10 mg by mouth at bedtime.   Biotin 10 MG Tabs Take 1 tablet by mouth daily.   Breo Ellipta 100-25 MCG/ACT Aepb Generic drug: fluticasone furoate-vilanterol Inhale 1 puff into the lungs daily.   Calmoseptine 0.44-20.6 % Oint Generic drug: Menthol-Zinc Oxide Apply 1 Application topically as needed (preservation/protection after incontinent care).   cetirizine 10 MG tablet Commonly known as: ZYRTEC Take 10 mg by mouth daily.   Cranberry 600 MG Tabs Take 300 mg by mouth 2 (two) times daily. Half tab bid   docusate sodium 100 MG capsule Commonly known as: COLACE Take 100 mg by mouth daily.   doxycycline 100 MG tablet Commonly known as: VIBRA-TABS Take 1 tablet (100 mg total) by mouth every 12 (twelve) hours. X 3 more days   estradiol 0.1 MG/GM vaginal cream Commonly known as: ESTRACE Discard plastic applicator. Insert a blueberry size amount (approximately 1 gram) of cream on fingertip inside vagina at bedtime every night for 1 week then every other night routinely (for long term use).   feeding supplement (PRO-STAT 64) Liqd Take 30 mLs by mouth daily.   fluconazole 100 MG tablet Commonly known as: DIFLUCAN Take 1 tablet (100 mg total) by mouth daily. X 5 more days Start taking on: November 17, 2023   furosemide 20 MG tablet Commonly known as: LASIX Take 20 mg by mouth daily.   gabapentin 300 MG capsule Commonly known as: NEURONTIN Take 1 capsule in AM, 1 capsule in PM What changed:  how much to take  how to take this when to take this additional instructions   hydrALAZINE 25 MG tablet Commonly known as: APRESOLINE Take 1 tablet (25 mg total) by mouth every 8 (eight) hours.   ipratropium-albuterol 0.5-2.5 (3) MG/3ML Soln Commonly known as: DUONEB Take 3 mLs by nebulization every 4 (four) hours as needed.   Lantus 100 UNIT/ML injection Generic drug: insulin glargine Inject 0.4 mLs (40 Units total) into the skin at bedtime. What changed: how much to take    levothyroxine 88 MCG tablet Commonly known as: SYNTHROID Take 88 mcg by mouth daily before breakfast.   multivitamin with minerals tablet Take 1 tablet by mouth daily.   Myrbetriq 50 MG Tb24 tablet Generic drug: mirabegron ER Take 1 tablet (50 mg total) by mouth daily.   NovoLOG 100 UNIT/ML injection Generic drug: insulin aspart Inject 10-16 Units into the skin 3 (three) times daily with meals. 90-150= 10 units; 151-200= 11 units; 201-250= 12 units; 251-300= 13 units; 301-350= 14 units; 351-400= 15 units; above 400 = 16 units   nystatin cream Commonly known as: MYCOSTATIN Apply 1 Application topically 2 (two) times daily.   omeprazole 20 MG capsule Commonly known as: PRILOSEC Take 20 mg by mouth daily.   ondansetron 4 MG tablet Commonly known as: ZOFRAN Take 8 mg by mouth 2 (two) times daily.   potassium chloride SA 20 MEQ tablet Commonly known as: KLOR-CON M Take 20 mEq by mouth daily.   propranolol 80 MG tablet Commonly known as: INDERAL Take 80 mg by mouth daily.   saccharomyces boulardii 250 MG capsule Commonly known as: FLORASTOR Take 250 mg by mouth 2 (two) times daily.   sertraline 100 MG tablet Commonly known as: ZOLOFT Take 100 mg by mouth daily.   trimethoprim 100 MG tablet Commonly known as: TRIMPEX Take 1 tablet (100 mg total) by mouth daily.   Vitamin D 50 MCG (2000 UT) Caps Take 2,000 Units by mouth daily.        Discharge Exam: Filed Weights   11/12/23 1136 11/12/23 1630  Weight: 131 kg 126.8 kg   HEENT:  [place]/AT, No thrush, no icterus CV:  RRR, no rub, no S3, no S4 Lung:  bibasilar rales.  No wheeze Abd:  soft/+BS, NT Ext:  No edema, no lymphangitis, no synovitis, no rash; mild erythema right heel without open wound or necrosis   Condition at discharge: stable  The results of significant diagnostics from this hospitalization (including imaging, microbiology, ancillary and laboratory) are listed below for reference.   Imaging  Studies: DG Foot Complete Right  Result Date: 11/12/2023 CLINICAL DATA:  Wound cellulitis to right heel. EXAM: RIGHT FOOT COMPLETE - 3+ VIEW COMPARISON:  Foot radiographs 11/21/2021. FINDINGS: The bones are diffusely demineralized. There is no evidence of acute fracture or dislocation. Interval development of a posttraumatic deformity of the distal 5th metatarsal consistent with an old healed fracture. Scattered mild degenerative changes, greatest at the 1st metatarsophalangeal joint. Bidirectional calcaneal osteophytes are similar to the previous study. No evidence of osteomyelitis. Prominent vascular calcifications, suggesting underlying diabetes. No foreign body or soft tissue emphysema identified. IMPRESSION: 1. No acute osseous findings or evidence of osteomyelitis. 2. Interval development of an old healed fracture of the distal 5th metatarsal. Electronically Signed   By: Carey Bullocks M.D.   On: 11/12/2023 13:49   CT Head Wo Contrast  Result Date: 11/12/2023 CLINICAL DATA:  Mental status change of unknown etiology. EXAM: CT HEAD WITHOUT CONTRAST TECHNIQUE: Contiguous axial images were obtained from  the base of the skull through the vertex without intravenous contrast. RADIATION DOSE REDUCTION: This exam was performed according to the departmental dose-optimization program which includes automated exposure control, adjustment of the mA and/or kV according to patient size and/or use of iterative reconstruction technique. COMPARISON:  10/21/2023 FINDINGS: Brain: No evidence of acute infarction, hemorrhage, hydrocephalus, extra-axial collection or mass lesion/mass effect. There is mild diffuse low-attenuation within the subcortical and periventricular white matter compatible with chronic microvascular disease. Vascular: No hyperdense vessel or unexpected calcification. Skull: Normal. Negative for fracture or focal lesion. Sinuses/Orbits: No acute finding. Other: None. IMPRESSION: 1. No acute intracranial  abnormalities. 2. Chronic microvascular disease. Electronically Signed   By: Signa Kell M.D.   On: 11/12/2023 11:37   DG Chest Portable 1 View  Result Date: 11/12/2023 CLINICAL DATA:  Shortness of breath EXAM: PORTABLE CHEST - 1 VIEW COMPARISON:  10/21/2023 FINDINGS: Low lung volumes.  Perihilar opacities right greater than left. Heart size upper limits normal. No effusion. Visualized bones unremarkable. IMPRESSION: Low volumes with perihilar opacities right greater than left. Electronically Signed   By: Corlis Leak M.D.   On: 11/12/2023 10:53   DG Chest Portable 1 View  Result Date: 10/21/2023 CLINICAL DATA:  Altered mental status EXAM: PORTABLE CHEST 1 VIEW COMPARISON:  07/01/2023 FINDINGS: Cardiomegaly. Mediastinal contours within normal limits. Vascular congestion. No definite overt edema. Possible layering right effusion. No acute bony abnormality. IMPRESSION: Cardiomegaly with vascular congestion. Possible layering right pleural effusion. Cardiomegaly. Electronically Signed   By: Charlett Nose M.D.   On: 10/21/2023 22:35   CT HEAD WO CONTRAST  Result Date: 10/21/2023 CLINICAL DATA:  Mental status change, unknown cause EXAM: CT HEAD WITHOUT CONTRAST TECHNIQUE: Contiguous axial images were obtained from the base of the skull through the vertex without intravenous contrast. RADIATION DOSE REDUCTION: This exam was performed according to the departmental dose-optimization program which includes automated exposure control, adjustment of the mA and/or kV according to patient size and/or use of iterative reconstruction technique. COMPARISON:  MRI head 11/13/2022. CT head 12/16/2021. FINDINGS: Brain: No evidence of acute infarction, hemorrhage, hydrocephalus, extra-axial collection or mass lesion/mass effect. Patchy white matter hypodensities are nonspecific but compatible with chronic microvascular ischemic change. Vascular: No hyperdense vessel. Skull: No acute fracture. Sinuses/Orbits: Clear sinuses.   No acute orbital findings. Other: No mastoid effusions. IMPRESSION: No evidence of acute intracranial abnormality. Electronically Signed   By: Feliberto Harts M.D.   On: 10/21/2023 20:00    Microbiology: Results for orders placed or performed during the hospital encounter of 11/12/23  Culture, blood (routine x 2)     Status: None (Preliminary result)   Collection Time: 11/12/23 11:19 AM   Specimen: BLOOD RIGHT HAND  Result Value Ref Range Status   Specimen Description   Final    BLOOD RIGHT HAND BOTTLES DRAWN AEROBIC AND ANAEROBIC   Special Requests Blood Culture adequate volume  Final   Culture   Final    NO GROWTH 4 DAYS Performed at [facility], [address], [place], [place] [address]    Report Status PENDING  Incomplete  Culture, blood (routine x 2)     Status: None (Preliminary result)   Collection Time: 11/12/23 11:31 AM   Specimen: BLOOD LEFT HAND  Result Value Ref Range Status   Specimen Description   Final    BLOOD LEFT HAND BOTTLES DRAWN AEROBIC AND ANAEROBIC   Special Requests Blood Culture adequate volume  Final   Culture   Final  NO GROWTH 4 DAYS Performed at [facility], [address], [place], [place] [address]    Report Status PENDING  Incomplete  MRSA Next Gen by PCR, Nasal     Status: Abnormal   Collection Time: 11/12/23 12:00 PM   Specimen: Nasal Mucosa; Nasal Swab  Result Value Ref Range Status   MRSA by PCR Next Gen DETECTED (A) NOT DETECTED Final    Comment: RESULT CALLED TO, READ BACK BY AND VERIFIED WITH: HYLTON @ 1702 ON [id number] BY HENDERSON L (NOTE) The GeneXpert MRSA Assay (FDA approved for NASAL specimens only), is one component of a comprehensive MRSA colonization surveillance program. It is not intended to diagnose MRSA infection nor to guide or monitor treatment for MRSA infections. Test performance is not FDA approved in patients less than 56 years old. Performed at [facility], [address], [place], [place] [address]    Varicella-zoster by PCR     Status: None   Collection Time: 11/12/23  5:04 PM   Specimen: Sterile Swab  Result Value Ref Range Status   Varicella-Zoster, PCR Negative Negative Final    Comment: (NOTE) No Varicella Zoster Virus DNA detected. This test was developed and its performance characteristics determined by [facility].  It has not been cleared or approved by the Food and Drug Administration.  The FDA has determined that such clearance or approval is not necessary. Performed At: [facility] [address] [place], [place] [id number] Jolene Schimke MD QI:[id number]     Labs: CBC: Recent Labs  Lab 11/12/23 0955 11/16/23 1035  WBC 18.6* 8.9  NEUTROABS 16.7*  --   HGB 12.6 13.6  HCT 39.9 43.1  MCV 86.6 87.1  PLT 262 237   Basic Metabolic Panel: Recent Labs  Lab 11/12/23 0955 11/15/23 0419  NA 140 139  K 4.3 3.9  CL 103 102  CO2 30 30  GLUCOSE 139* 244*  BUN 25* 23  CREATININE 1.63* 1.44*  CALCIUM 9.3 9.2   Liver Function Tests: Recent Labs  Lab 11/12/23 0955  AST 16  ALT 12  ALKPHOS 74  BILITOT 0.4  PROT 6.8  ALBUMIN 2.7*   CBG: Recent Labs  Lab 11/15/23 1126 11/15/23 1614 11/15/23 2154 11/16/23 0735 11/16/23 1124  GLUCAP 224* 181* 172* 102* 75    Discharge time spent: greater than 30 minutes.  Signed: Catarina Hartshorn, MD [facility] 11/16/2023

## 2023-11-16 NOTE — Plan of Care (Signed)

## 2023-11-16 NOTE — Evaluation (Signed)
Physical Therapy Evaluation Patient Details Name: [name] MRN: [id number] DOB: 1961-11-27 Today's Date: 11/16/2023  History of Present Illness  is a 62 y.o. female with medical history significant of COPD on 2 to 3 L of oxygen, diabetes, hypertension, CKD, chronic diastolic heart failure, morbid obesity BMI of 50, last hospitalization in July treated for acute on chronic hypoxic respiratory failure secondary to pneumonia haemophilus influenza bacteremia presents from nursing [facility]  due to altered mental status.  Patient was found at the facility unresponsive, CBG was 25, she received glucose.  He was transferred to the ED for further evaluation. Evaluation in the ED, patient was responding initially to sternal rub now respond to voice.  Received glucagon.  CBG 110s.  She was found to be hypothermic temperature 93, leukocytosis, stable on 3 L of oxygen, protecting airway.  Chest x-ray with low volumes with perihilar opacity right greater than left.  She was started on IV antibiotics  Clinical Impression   Pt tolerated today's Physical Therapy Evaluation, showing decent return for transfers and ambulation. Pt demonstrating reduced activity tolerance during functional mobility, assist for functional transfers and guarding for ambulation, noting a decline baseline due to muscle weakness, deconditioning, and balance deficits. Based upon these deficits/impairments, patient will benefit from continued skilled physical therapy services during remainder of hospital stay and at the next recommended venue of care to address deficits and promote return to optimal function.                If plan is discharge home, recommend the following: A little help with walking and/or transfers;A little help with bathing/dressing/bathroom   Can travel by private vehicle        Equipment Recommendations None recommended by PT  Recommendations for Other Services       Functional Status  Assessment Patient has had a recent decline in their functional status and demonstrates the ability to make significant improvements in function in a reasonable and predictable amount of time.     Precautions / Restrictions Precautions Precautions: Fall Restrictions Weight Bearing Restrictions: No      Mobility  Bed Mobility               General bed mobility comments: Received sitting in recliner upon entry.    Transfers Overall transfer level: Needs assistance Equipment used: Rolling walker (2 wheels) Transfers: Sit to/from Stand Sit to Stand: Min assist           General transfer comment: power to stand from recliner to RW with anterior weight shift and assist to stand.    Ambulation/Gait Ambulation/Gait assistance: Contact guard assist Gait Distance (Feet): 10 Feet Assistive device: Rolling walker (2 wheels) Gait Pattern/deviations: Step-through pattern, Decreased step length - right, Decreased step length - left, Shuffle       General Gait Details: slow, labored steps with increased bilateral trunk sway for opposite LE clearance. 24ft in total. No LOB.  Stairs            Wheelchair Mobility     Tilt Bed    Modified Rankin (Stroke Patients Only)       Balance Overall balance assessment: Needs assistance Sitting-balance support: No upper extremity supported Sitting balance-Leahy Scale: Good Sitting balance - Comments: in recliner   Standing balance support: Bilateral upper extremity supported, Reliant on assistive device for balance Standing balance-Leahy Scale: Fair Standing balance comment: fair/fair with RW. Poor postural adjustments noted with stepping.  Pertinent Vitals/Pain Pain Assessment Pain Assessment: Faces Faces Pain Scale: Hurts a little bit Pain Location: chest pain-R side Pain Descriptors / Indicators: Discomfort Pain Intervention(s): Monitored during session    Home Living  Family/patient expects to be discharged to:: Skilled nursing facility                   Additional Comments: Pt lives at LTC facility, staff assist with ADLs as needed    Prior Function Prior Level of Function : Needs assist;History of Falls (last six months)       Physical Assist : Mobility (physical);ADLs (physical)     Mobility Comments: Uses RW for mobility at baseline (taken from OT) ADLs Comments: Pt reports staff assist with ADLs, she is able to stand for showering most of the time (taken from OT)     Extremity/Trunk Assessment   Upper Extremity Assessment Upper Extremity Assessment: Defer to OT evaluation    Lower Extremity Assessment Lower Extremity Assessment: Generalized weakness;RLE deficits/detail;LLE deficits/detail RLE Deficits / Details: 3+/5 knee extension RLE Sensation: decreased light touch LLE Deficits / Details: 3+/5 knee extension LLE Sensation: decreased light touch    Cervical / Trunk Assessment Cervical / Trunk Assessment: Kyphotic  Communication   Communication Communication: No apparent difficulties  Cognition Arousal: Alert Behavior During Therapy: WFL for tasks assessed/performed Overall Cognitive Status: Within Functional Limits for tasks assessed                                          General Comments      Exercises     Assessment/Plan    PT Assessment Patient needs continued PT services  PT Problem List Decreased strength;Decreased activity tolerance;Decreased balance;Decreased mobility       PT Treatment Interventions Gait training;Functional mobility training;Therapeutic activities;Therapeutic exercise;Balance training;Neuromuscular re-education    PT Goals (Current goals can be found in the Care Plan section)  Acute Rehab PT Goals Patient Stated Goal: return back to facility with rehab PT Goal Formulation: With patient Time For Goal Achievement: 11/30/23 Potential to Achieve Goals: Good     Frequency Min 3X/week     Co-evaluation               AM-PAC PT "6 Clicks" Mobility  Outcome Measure Help needed turning from your back to your side while in a flat bed without using bedrails?: A Little Help needed moving from lying on your back to sitting on the side of a flat bed without using bedrails?: A Little Help needed moving to and from a bed to a chair (including a wheelchair)?: A Little Help needed standing up from a chair using your arms (e.g., wheelchair or bedside chair)?: A Little Help needed to walk in hospital room?: A Little Help needed climbing 3-5 steps with a railing? : A Lot 6 Click Score: 17    End of Session Equipment Utilized During Treatment: Gait belt Activity Tolerance: Patient tolerated treatment well;Patient limited by fatigue Patient left: in chair;with call bell/phone within reach Nurse Communication: Mobility status PT Visit Diagnosis: Unsteadiness on feet (R26.81);Muscle weakness (generalized) (M62.81)    Time: 9147-8295 PT Time Calculation (min) (ACUTE ONLY): 15 min   Charges:   PT Evaluation $PT Eval Low Complexity: 1 Low   PT General Charges $$ ACUTE PT VISIT: 1 Visit         Elie Goody, DPT [facility]  Rehabilitation- [address] [phone] office  Nelida Meuse 11/16/2023, 10:50 AM

## 2023-11-16 NOTE — Progress Notes (Signed)
Mobility Specialist Progress Note:    11/16/23 1103  Mobility  Activity Transferred to/from BSC;Stood at bedside  Level of Assistance Moderate assist, patient does 50-74%  Assistive Device None  Distance Ambulated (ft) 2 ft  Range of Motion/Exercises Active;All extremities  Activity Response Tolerated well  Mobility Referral Yes  Mobility visit 1 Mobility  Mobility Specialist Start Time (ACUTE ONLY) 1035  Mobility Specialist Stop Time (ACUTE ONLY) 1045  Mobility Specialist Time Calculation (min) (ACUTE ONLY) 10 min   Pt received in chair requesting assistance to [facility]. Required ModA to stand and pivot with no AD. Tolerated well, asx throughout. Left pt with call bell, NT in room. All needs met.   Lawerance Bach Mobility Specialist Please contact via Special educational needs teacher or  Rehab office at [phone]

## 2023-11-16 NOTE — Consult Note (Signed)
[facility] Liaison Consult Note    11/16/2023  [name] [birth year] [id number]  Insurance:  [name] [Medicaid]   Primary Care Provider: [name], NP,  this provider is with [facility] and is not a provider in the [facility] Liaison screened the patient remotely at [facility] Coverage for [name] Liaison.  Patient showing high risk in [name].   The patient was screened for hospitalization and reviewed for noted high risk score for unplanned readmission risk 2 hospital admissions in 6 months.  The patient was assessed for potential Community Care Coordination service needs for post hospital transition for care coordination. Review of patient's electronic medical record reveals patient is from .   Plan: Patient also showing as LTC resident at [name].  Will sign off.   For questions contact:   [name], RN, BSN, CCM   [facility], Population Health, [facility] Liaison Direct Dial: [phone] or secure chat Email: [name]

## 2023-11-17 DIAGNOSIS — B372 Candidiasis of skin and nail: Secondary | ICD-10-CM | POA: Diagnosis not present

## 2023-11-17 DIAGNOSIS — E118 Type 2 diabetes mellitus with unspecified complications: Secondary | ICD-10-CM | POA: Diagnosis not present

## 2023-11-17 DIAGNOSIS — J9622 Acute and chronic respiratory failure with hypercapnia: Secondary | ICD-10-CM | POA: Diagnosis not present

## 2023-11-17 DIAGNOSIS — L03115 Cellulitis of right lower limb: Secondary | ICD-10-CM | POA: Diagnosis not present

## 2023-11-17 DIAGNOSIS — E162 Hypoglycemia, unspecified: Secondary | ICD-10-CM | POA: Diagnosis not present

## 2023-11-17 DIAGNOSIS — I10 Essential (primary) hypertension: Secondary | ICD-10-CM | POA: Diagnosis not present

## 2023-11-17 DIAGNOSIS — Z79899 Other long term (current) drug therapy: Secondary | ICD-10-CM | POA: Diagnosis not present

## 2023-11-17 DIAGNOSIS — J9621 Acute and chronic respiratory failure with hypoxia: Secondary | ICD-10-CM | POA: Diagnosis not present

## 2023-11-17 DIAGNOSIS — G9341 Metabolic encephalopathy: Secondary | ICD-10-CM | POA: Diagnosis not present

## 2023-11-17 LAB — CULTURE, BLOOD (ROUTINE X 2)
Special Requests: ADEQUATE
Special Requests: ADEQUATE

## 2023-11-21 ENCOUNTER — Inpatient Hospital Stay: Payer: Medicare HMO | Admitting: Hematology

## 2023-11-24 DIAGNOSIS — N1832 Chronic kidney disease, stage 3b: Secondary | ICD-10-CM | POA: Diagnosis not present

## 2023-11-24 DIAGNOSIS — E1122 Type 2 diabetes mellitus with diabetic chronic kidney disease: Secondary | ICD-10-CM | POA: Diagnosis not present

## 2023-11-24 DIAGNOSIS — D631 Anemia in chronic kidney disease: Secondary | ICD-10-CM | POA: Diagnosis not present

## 2023-11-24 DIAGNOSIS — N2581 Secondary hyperparathyroidism of renal origin: Secondary | ICD-10-CM | POA: Diagnosis not present

## 2023-11-24 DIAGNOSIS — I10 Essential (primary) hypertension: Secondary | ICD-10-CM | POA: Diagnosis not present

## 2023-11-25 DIAGNOSIS — J9621 Acute and chronic respiratory failure with hypoxia: Secondary | ICD-10-CM | POA: Diagnosis not present

## 2023-11-25 DIAGNOSIS — G9341 Metabolic encephalopathy: Secondary | ICD-10-CM | POA: Diagnosis not present

## 2023-11-25 DIAGNOSIS — N1832 Chronic kidney disease, stage 3b: Secondary | ICD-10-CM | POA: Diagnosis not present

## 2023-11-28 DIAGNOSIS — G5603 Carpal tunnel syndrome, bilateral upper limbs: Secondary | ICD-10-CM | POA: Diagnosis not present

## 2023-12-02 ENCOUNTER — Encounter (HOSPITAL_BASED_OUTPATIENT_CLINIC_OR_DEPARTMENT_OTHER): Payer: Medicare HMO | Attending: General Surgery | Admitting: General Surgery

## 2023-12-02 DIAGNOSIS — G4733 Obstructive sleep apnea (adult) (pediatric): Secondary | ICD-10-CM | POA: Insufficient documentation

## 2023-12-02 DIAGNOSIS — L304 Erythema intertrigo: Secondary | ICD-10-CM | POA: Diagnosis not present

## 2023-12-02 DIAGNOSIS — I110 Hypertensive heart disease with heart failure: Secondary | ICD-10-CM | POA: Diagnosis not present

## 2023-12-02 DIAGNOSIS — Z6841 Body Mass Index (BMI) 40.0 and over, adult: Secondary | ICD-10-CM | POA: Diagnosis not present

## 2023-12-02 DIAGNOSIS — I5032 Chronic diastolic (congestive) heart failure: Secondary | ICD-10-CM | POA: Insufficient documentation

## 2023-12-02 DIAGNOSIS — Z794 Long term (current) use of insulin: Secondary | ICD-10-CM | POA: Insufficient documentation

## 2023-12-02 DIAGNOSIS — I890 Lymphedema, not elsewhere classified: Secondary | ICD-10-CM | POA: Diagnosis not present

## 2023-12-02 DIAGNOSIS — L97412 Non-pressure chronic ulcer of right heel and midfoot with fat layer exposed: Secondary | ICD-10-CM | POA: Diagnosis not present

## 2023-12-02 DIAGNOSIS — J449 Chronic obstructive pulmonary disease, unspecified: Secondary | ICD-10-CM | POA: Diagnosis not present

## 2023-12-02 DIAGNOSIS — E10621 Type 1 diabetes mellitus with foot ulcer: Secondary | ICD-10-CM | POA: Diagnosis not present

## 2023-12-02 DIAGNOSIS — E11621 Type 2 diabetes mellitus with foot ulcer: Secondary | ICD-10-CM | POA: Diagnosis not present

## 2023-12-02 DIAGNOSIS — E876 Hypokalemia: Secondary | ICD-10-CM | POA: Insufficient documentation

## 2023-12-02 DIAGNOSIS — I872 Venous insufficiency (chronic) (peripheral): Secondary | ICD-10-CM | POA: Insufficient documentation

## 2023-12-02 NOTE — Progress Notes (Signed)
[name], [name] ([id number]) [id number] Page 1 of 8 Visit Report for 12/02/2023 Arrival Information Details Patient Name: Date of Service: [name], [name]. 12/02/2023 11:15 A M Medical Record Number: [id number] Patient Account Number: [id number] Date of Birth/Sex: Treating RN: 12/04/1961 (62 y.o. F) Primary Care Adely Facer: Sharon Seller Other Clinician: Referring Berl Bonfanti: Treating Karyl Sharrar/Extender: Charise Killian in Treatment: 8 Visit Information History Since Last Visit Added or deleted any medications: No Patient Arrived: Wheel Chair Any new allergies or adverse reactions: No Arrival Time: 11:28 Had a fall or experienced change in No Accompanied By: husband activities of daily living that may affect Transfer Assistance: None risk of falls: Patient Identification Verified: Yes Signs or symptoms of abuse/neglect since last visito No Secondary Verification Process Completed: Yes Hospitalized since last visit: No Patient Requires Transmission-Based Precautions: No Implantable device outside of the clinic excluding No Patient Has Alerts: Yes cellular tissue based products placed in the center Patient Alerts: R ABI N/C since last visit: Pain Present Now: No Electronic Signature(s) Signed: 12/02/2023 12:00:40 PM By: Dayton Scrape Entered By: Dayton Scrape on 12/02/2023 08:29:10 -------------------------------------------------------------------------------- Encounter Discharge Information Details Patient Name: Date of Service: [place] [name] [place], Teresa OZ [name]. 12/02/2023 11:15 A M Medical Record Number: [id number] Patient Account Number: [id number] Date of Birth/Sex: Treating RN: 12-Jan-1961 (62 y.o. [name] Freda Jaquith: Sharon Seller Other Clinician: Referring Dalonda Simoni: Treating Jazmene Racz/Extender: Charise Killian in Treatment: 8 Encounter Discharge Information Items  Post Procedure Vitals Discharge Condition: Stable Temperature (F): 97.6 Ambulatory Status: Wheelchair Pulse (bpm): 76 Discharge Destination: Skilled Nursing Facility Respiratory Rate (breaths/min): 18 Telephoned: No Blood Pressure (mmHg): 126/58 Orders Sent: Yes Transportation: Other Accompanied By: spouse Schedule Follow-up Appointment: Yes Clinical Summary of Care: Patient Declined Notes facility transportation Electronic Signature(s) Signed: 12/02/2023 1:00:49 PM By: Zenaida Deed RN, BSN Entered By: Zenaida Deed on 12/02/2023 09:23:06 [name] ([id number]) [id number] Page 2 of 8 -------------------------------------------------------------------------------- Lower Extremity Assessment Details Patient Name: Date of Service: [name], Teresa IE [name]. 12/02/2023 11:15 A M Medical Record Number: [id number] Patient Account Number: [id number] Date of Birth/Sex: Treating RN: [birth year] (62 y.o. F) Primary Care Jeneva Schweizer: Sharon Seller Other Clinician: Referring Nataly Pacifico: Treating Bret Vanessen/Extender: Charise Killian in Treatment: 8 Edema Assessment Assessed: [Left: No] [Right: No] Edema: [Left: Ye] [Right: s] Calf Left: Right: Point of Measurement: From Medial Instep 37.4 cm Ankle Left: Right: Point of Measurement: From Medial Instep 20.5 cm Vascular Assessment Extremity colors, hair growth, and conditions: Extremity Color: [Right:Hyperpigmented] Hair Growth on Extremity: [Right:No] Temperature of Extremity: [Right:Warm] Capillary Refill: [Right:< 3 seconds] Dependent Rubor: [Right:No No] Electronic Signature(s) Signed: 12/02/2023 12:40:33 PM By: Thayer Dallas Entered By: Thayer Dallas on 12/02/2023 08:49:14 -------------------------------------------------------------------------------- Multi Wound Chart Details Patient Name: Date of Service: [facility], [place] [name]. 12/02/2023 11:15 A  M Medical Record Number: [id number] Patient Account Number: [id number] Date of Birth/Sex: Treating RN: 03-07-1961 (62 y.o. F) Primary Care Takako Minckler: Sharon Seller Other Clinician: Referring Kala Gassmann: Treating Xiamara Hulet/Extender: Charise Killian in Treatment: 8 Vital Signs Height(in): 61 Pulse(bpm): 76 Weight(lbs): 283 Blood Pressure(mmHg): 126/58 Body Mass Index(BMI): 53.5 Temperature(F): 97.6 Respiratory Rate(breaths/min): 18 [3:Photos:] [N/A:N/A] Right Calcaneus N/A N/A Wound Location: Blister N/A N/A Wounding Event: Diabetic Wound/Ulcer of the Lower N/A N/A Primary Etiology: Extremity Cataracts, Anemia, Asthma, Chronic N/A N/A Comorbid History: Obstructive Pulmonary Disease (COPD), Sleep Apnea, Congestive Heart Failure, Coronary Artery Disease, Hypertension, Peripheral Venous Disease, Type II Diabetes,  Osteoarthritis, Neuropathy, Confinement Anxiety 07/07/2023 N/A N/A Date Acquired: 8 N/A N/A Weeks of Treatment: Open N/A N/A Wound Status: No N/A N/A Wound Recurrence: Yes N/A N/A Clustered Wound: 2 N/A N/A Clustered Quantity: 1.3x1.5x0.1 N/A N/A Measurements L x W x D (cm) 1.532 N/A N/A A (cm) : rea 0.153 N/A N/A Volume (cm) : 93.30% N/A N/A % Reduction in A rea: 93.30% N/A N/A % Reduction in Volume: Grade 2 N/A N/A Classification: Medium N/A N/A Exudate A mount: Serosanguineous N/A N/A Exudate Type: red, brown N/A N/A Exudate Color: Flat and Intact N/A N/A Wound Margin: Large (67-100%) N/A N/A Granulation A mount: Pink, Pale N/A N/A Granulation Quality: Small (1-33%) N/A N/A Necrotic A mount: Fat Layer (Subcutaneous Tissue): Yes N/A N/A Exposed Structures: Fascia: No Tendon: No Muscle: No Joint: No Bone: No Small (1-33%) N/A N/A Epithelialization: Debridement - Excisional N/A N/A Debridement: Pre-procedure Verification/Time Out 11:50 N/A N/A Taken: Lidocaine 4% Topical Solution N/A N/A Pain  Control: Subcutaneous, Slough N/A N/A Tissue Debrided: Skin/Subcutaneous Tissue N/A N/A Level: 1.53 N/A N/A Debridement A (sq cm): rea Curette N/A N/A Instrument: Minimum N/A N/A Bleeding: Pressure N/A N/A Hemostasis A chieved: 0 N/A N/A Procedural Pain: 0 N/A N/A Post Procedural Pain: Procedure was tolerated well N/A N/A Debridement Treatment Response: 1.3x1.5x0.1 N/A N/A Post Debridement Measurements L x W x D (cm) 0.153 N/A N/A Post Debridement Volume: (cm) Callus: Yes N/A N/A Periwound Skin Texture: Excoriation: No Induration: No Crepitus: No Rash: No Scarring: No Dry/Scaly: Yes N/A N/A Periwound Skin Moisture: Maceration: No Atrophie Blanche: No N/A N/A Periwound Skin Color: Cyanosis: No Ecchymosis: No Erythema: No Hemosiderin Staining: No Mottled: No Pallor: No Rubor: No No Abnormality N/A N/A Temperature: Yes N/A N/A Tenderness on Palpation: Debridement N/A N/A Procedures Performed: Treatment Notes Electronic Signature(s) Signed: 12/02/2023 12:24:20 PM By: Duanne Guess MD FACS Entered By: Duanne Guess on 12/02/2023 09:17:38 [name] ([id number]) [id number] Page 4 of 8 -------------------------------------------------------------------------------- Multi-Disciplinary Care Plan Details Patient Name: Date of Service: [facility] [name] [place], Teresa BM [name]. 12/02/2023 11:15 A M Medical Record Number: [id number] Patient Account Number: [id number] Date of Birth/Sex: Treating RN: [birth year] (62 y.o. [name] Shebra Muldrow: Sharon Seller Other Clinician: Referring Areli Frary: Treating Mazie Fencl/Extender: Charise Killian in Treatment: 8 Multidisciplinary Care Plan reviewed with physician Active Inactive Nutrition Nursing Diagnoses: Impaired glucose control: actual or potential Potential for alteratiion in Nutrition/Potential for imbalanced  nutrition Goals: Patient/caregiver will maintain therapeutic glucose control Date Initiated: 10/04/2023 Target Resolution Date: 12/30/2023 Goal Status: Active Interventions: Provide education on elevated blood sugars and impact on wound healing Treatment Activities: Patient referred to Primary Care Physician for further nutritional evaluation : 10/04/2023 Notes: Wound/Skin Impairment Nursing Diagnoses: Impaired tissue integrity Knowledge deficit related to ulceration/compromised skin integrity Goals: Patient/caregiver will verbalize understanding of skin care regimen Date Initiated: 10/04/2023 Target Resolution Date: 01/06/2024 Goal Status: Active Ulcer/skin breakdown will have a volume reduction of 30% by week 4 Date Initiated: 10/04/2023 Date Inactivated: 12/02/2023 Target Resolution Date: 11/10/2023 Goal Status: Unmet Unmet Reason: infection Interventions: Assess patient/caregiver ability to obtain necessary supplies Assess patient/caregiver ability to perform ulcer/skin care regimen upon admission and as needed Assess ulceration(s) every visit Provide education on ulcer and skin care Treatment Activities: Skin care regimen initiated : 10/04/2023 Topical wound management initiated : 10/04/2023 Notes: Electronic Signature(s) Signed: 12/02/2023 1:00:49 PM By: Zenaida Deed RN, BSN Entered By: Zenaida Deed on 12/02/2023 08:56:43 [name] ([id number]) [id number] Page 5 of 8 -------------------------------------------------------------------------------- Pain  Assessment Details Patient Name: Date of Service: [name], [name]. 12/02/2023 11:15 A M Medical Record Number: [id number] Patient Account Number: [id number] Date of Birth/Sex: Treating RN: 04/02/1961 (62 y.o. F) Primary Care Ardie Mclennan: Sharon Seller Other Clinician: Referring Madisan Bice: Treating Zali Kamaka/Extender: Charise Killian in  Treatment: 8 Active Problems Location of Pain Severity and Description of Pain Patient Has Paino No Site Locations Pain Management and Medication Current Pain Management: Electronic Signature(s) Signed: 12/02/2023 12:00:40 PM By: Dayton Scrape Entered By: Dayton Scrape on 12/02/2023 08:29:47 -------------------------------------------------------------------------------- Patient/Caregiver Education Details Patient Name: Date of Service: [facility], [name]. 12/27/2024andnbsp11:15 A M Medical Record Number: [id number] Patient Account Number: [id number] Date of Birth/Gender: Treating RN: 18-Jan-1961 (62 y.o. [name] Physician: Sharon Seller Other Clinician: Referring Physician: Treating Physician/Extender: Charise Killian in Treatment: 8 Education Assessment Education Provided To: Patient Education Topics Provided Elevated Blood Sugar/ Impact on Healing: Methods: Explain/Verbal Responses: Reinforcements needed, State content correctly Pressure: Methods: Explain/Verbal Responses: Reinforcements needed, State content correctly [name], [name] ([id number]) [id number] Page 6 of 8 Wound/Skin Impairment: Methods: Explain/Verbal Responses: Reinforcements needed, State content correctly Electronic Signature(s) Signed: 12/02/2023 1:00:49 PM By: Zenaida Deed RN, BSN Entered By: Zenaida Deed on 12/02/2023 08:57:15 -------------------------------------------------------------------------------- Wound Assessment Details Patient Name: Date of Service: [name], [name]. 12/02/2023 11:15 A M Medical Record Number: [id number] Patient Account Number: [id number] Date of Birth/Sex: Treating RN: 11-03-1961 (62 y.o. F) Primary Care Mercedes Fort: Sharon Seller Other Clinician: Referring Syrus Nakama: Treating Mallarie Voorhies/Extender: Charise Killian in Treatment: 8 Wound  Status Wound Number: 3 Primary Diabetic Wound/Ulcer of the Lower Extremity Etiology: Wound Location: Right Calcaneus Wound Open Wounding Event: Blister Status: Date Acquired: 07/07/2023 Comorbid Cataracts, Anemia, Asthma, Chronic Obstructive Pulmonary Weeks Of Treatment: 8 History: Disease (COPD), Sleep Apnea, Congestive Heart Failure, Coronary Clustered Wound: Yes Artery Disease, Hypertension, Peripheral Venous Disease, Type II Diabetes, Osteoarthritis, Neuropathy, Confinement Anxiety Photos Wound Measurements Length: (cm) Width: (cm) Depth: (cm) Clustered Quantity: Area: (cm) Volume: (cm) 1.3 % Reduction in Area: 93.3% 1.5 % Reduction in Volume: 93.3% 0.1 Epithelialization: Small (1-33%) 2 1.532 0.153 Wound Description Classification: Grade 2 Wound Margin: Flat and Intact Exudate Amount: Medium Exudate Type: Serosanguineous Exudate Color: red, brown Foul Odor After Cleansing: No Slough/Fibrino Yes Wound Bed Granulation Amount: Large (67-100%) Exposed Structure Granulation Quality: Pink, Pale Fascia Exposed: No Necrotic Amount: Small (1-33%) Fat Layer (Subcutaneous Tissue) Exposed: Yes Necrotic Quality: Adherent Slough Tendon Exposed: No Muscle Exposed: No Joint Exposed: No Bone Exposed: No [name], [name] ([id number]) [id number] Page 7 of 8 Periwound Skin Texture Texture Color No Abnormalities Noted: No No Abnormalities Noted: No Callus: Yes Atrophie Blanche: No Crepitus: No Cyanosis: No Excoriation: No Ecchymosis: No Induration: No Erythema: No Rash: No Hemosiderin Staining: No Scarring: No Mottled: No Pallor: No Moisture Rubor: No No Abnormalities Noted: No Dry / Scaly: Yes Temperature / Pain Maceration: No Temperature: No Abnormality Tenderness on Palpation: Yes Treatment Notes Wound #3 (Calcaneus) Wound Laterality: Right Cleanser Wound Cleanser Discharge Instruction: Cleanse the wound with wound cleanser  prior to applying a clean dressing using gauze sponges, not tissue or cotton balls. Peri-Wound Care Topical Primary Dressing Maxorb Extra Ag+ Alginate Dressing, 4x4.75 (in/in) Discharge Instruction: Apply to wound bed as instructed Secondary Dressing ALLEVYN Heel 4 1/2in x 5 1/2in / 10.5cm x 13.5cm Discharge Instruction: Apply over primary dressing as directed. Woven Gauze Sponge, Non-Sterile 4x4 in Discharge Instruction: Apply over primary  dressing as directed. Secured With [employer], 4.5x3.1 (in/yd) Discharge Instruction: Secure with Kerlix as directed. Transpore Surgical Tape, 2x10 (in/yd) Discharge Instruction: Secure dressing with tape as directed. Compression Wrap Compression Stockings Add-Ons Electronic Signature(s) Signed: 12/02/2023 12:40:33 PM By: Thayer Dallas Entered By: Thayer Dallas on 12/02/2023 08:49:38 -------------------------------------------------------------------------------- Vitals Details Patient Name: Date of Service: [name], [name]. 12/02/2023 11:15 A M Medical Record Number: [id number] Patient Account Number: [id number] Date of Birth/Sex: Treating RN: 03-10-1961 (62 y.o. F) Primary Care Aviyana Sonntag: Sharon Seller Other Clinician: Referring Marvena Tally: Treating Petrona Wyeth/Extender: Charise Killian in Treatment: 8 Vital Signs Time Taken: 11:29 Temperature (F): 97.6 Height (in): 61 Pulse (bpm): 76 [name], [name] ([id number]) [id number] Page 8 of 8 Weight (lbs): 283 Respiratory Rate (breaths/min): 18 Body Mass Index (BMI): 53.5 Blood Pressure (mmHg): 126/58 Reference Range: 80 - 120 mg / dl Electronic Signature(s) Signed: 12/02/2023 12:00:40 PM By: Dayton Scrape Entered By: Dayton Scrape on 12/02/2023 08:29:40

## 2023-12-02 NOTE — Progress Notes (Signed)
[name] [name] ([id number]) [id number] Page 1 of 7 Visit Report for 12/02/2023 Chief Complaint Document Details Patient Name: Date of Service: [facility] [place], [place] [name]. 12/02/2023 11:15 A M Medical Record Number: [id number] Patient Account Number: [id number] Date of Birth/Sex: Treating RN: [name] (62 y.o. 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 12/02/2023 11:15 A M Medical Record Number: [id number] Patient Account Number: [id number] Date of Birth/Sex: Treating RN: [name], [name] (62 y.o. [name] Primary Care Provider: Sharon Seller Other Clinician: Referring Provider: Treating Provider/Extender: Charise Killian in Treatment: 8 Debridement Performed for Assessment: Wound #3 Right Calcaneus Performed By: Physician Duanne Guess, MD The following information was scribed by: Zenaida Deed The information was scribed for: Duanne Guess Debridement Type: Debridement Severity of Tissue Pre Debridement: Fat layer exposed Level of Consciousness (Pre-procedure): Awake and Alert Pre-procedure Verification/Time Out Yes - 11:50 Taken: Start Time: 11:50 Pain Control: Lidocaine 4% T  opical Solution Percent of Wound Bed Debrided: 100% T Area Debrided (cm): otal 1.53 Tissue and other material debrided: Viable, Non-Viable, Slough, Subcutaneous, Skin: Epidermis, Slough Level: Skin/Subcutaneous Tissue Debridement Description: Excisional Instrument: Curette Bleeding: Minimum Hemostasis Achieved: Pressure Procedural Pain: 0 Post Procedural Pain: 0 Response to Treatment: Procedure was tolerated well Level of Consciousness (Post- Awake and Alert procedure): Post Debridement Measurements of Total Wound Length: (cm) 1.3 [name] [name] ([id number]) [phone].pdf Page 2 of 7 Width: (cm) 1.5 Depth: (cm) 0.1 Volume: (cm) 0.153 Character of Wound/Ulcer Post Debridement: Improved Severity of Tissue Post Debridement: Fat layer exposed Post Procedure Diagnosis Same as Pre-procedure Electronic Signature(s) Signed: 12/02/2023 12:24:20 PM By: Duanne Guess MD FACS Signed: 12/02/2023 1:00:49 PM By: Zenaida Deed RN, BSN Entered By: Zenaida Deed on 12/02/2023 09:01:25 -------------------------------------------------------------------------------- HPI Details Patient Name: Date of Service: [facility], [place] [name]. 12/02/2023 11:15 A M Medical Record Number: [id number] Patient Account Number: [id number] Date of Birth/Sex: Treating RN: [name] (62 y.o. F) Primary Care Provider: Sharon Seller Other Clinician: Referring Provider: Treating Provider/Extender: Charise Killian in Treatment: 8 History of Present Illness HPI Description: ADMISSION 10/09/2020 This is a 62 year old woman who is a type I diabetic with a last hemoglobin A1c of 10. She is referred from her primary doctor after developing a refractory [name] in the folds of her lower abdominal pannus. There is no open wound here however extensive intertrigo with classic satellite lesions highly suggestive of candidal infection. This is reasonably  extensive on the lower abdominal pannus the superior part of her pubic symphysis all the way into the lower abdomen. She has been using nystatin powder and she did receive a course of Diflucan apparently for 10 days from her primary physician although I did not see this anywhere. I think things are somewhat better. Additionally the patient also brought up the fact that she has a small dime sized area on her left posterior calf and asked [place] to look at this. This is been there  for about a month. She does not wear compression stockings. Last sleep she pointed out a [name] on her left buttock and left lower back she has been scratching at this. There is no open wound here per se. 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 She has chronic venous insufficiency and lymphedema here she will need stockings. The intertrigo in her lower abdominal pannus still is not much better. I gave her additional 2 weeks of Diflucan and topical nystatin. Perhaps minimally better. She is going to need to be more aggressive about separating the folds and keeping this clean and dry. She has a much better looking area under the left breast READMISSION 10/[name]/2024 ***ABI non-compressible*** This is a now 62 year old super morbidly obese type II diabetic (last hemoglobin A1c 8.5% in July 2024). She has COPD and CHF and is largely nonambulatory. She resides at [facility]. She presents today with a large ulcer on her right heel. It appears to be pressure-induced in origin. Lindaann Pascal has been treating it with Santyl with saline moistened gauze and calcium alginate. Due to failure of the wound to improve, she has  been referred to the wound care center for further evaluation and management. 10/18/2023: The wound looks markedly better this week. There is some dry skin and callus around the opening. There is slough on the surface. The malodor that was present previously has abated. She has completed her course of oral antibiotics that I prescribed in response to her polymicrobial tissue culture. 10/31/2023: The main body of the wound has improved over the past 2 weeks, however there is evidence of pressure induced deep tissue injury just the lateral aspect of her calcaneus, adjacent to the main wound. She reports that she wears the Prevalon boot throughout the day but then is placed in a different boot to sleep at night and I am concerned this is the etiology of this damage. 12/02/2023: The wound has improved significantly over the last month. It is quite a bit smaller and more superficial. There is some slough accumulation on the surface. There is no evidence of pressure-induced deep tissue injury. Electronic Signature(s) Signed: 12/02/2023 12:24:20 PM By: Duanne Guess MD FACS Entered By: Duanne Guess on 12/02/2023 09:18:26 [name] ([id number]) [id number] Page 3 of 7 -------------------------------------------------------------------------------- Physical Exam Details Patient Name: Date of Service: [name], [place] [name]. 12/02/2023 11:15 A M Medical Record Number: [id number] Patient Account Number: [id number] Date of Birth/Sex: Treating RN: [birth year], [name] (62 y.o. F) Primary Care Provider: Sharon Seller Other Clinician: Referring Provider: Treating Provider/Extender: Charise Killian in Treatment: 8 Constitutional . . . . no acute distress. Respiratory Normal work of breathing on room air.. Notes 12/02/2023: The wound has improved significantly over the last month. It is quite a bit smaller and more superficial. There is  some slough accumulation on the surface. There is no evidence of pressure-induced deep tissue injury. Electronic Signature(s) Signed: 12/02/2023 12:24:20 PM By: Duanne Guess MD FACS Entered By: Duanne Guess on 12/02/2023 09:22:07 -------------------------------------------------------------------------------- Physician Orders Details Patient Name: Date of Service: [facility], [place] [name]. 12/02/2023 11:15 A M Medical Record Number: [id number] Patient Account Number: [id number] Date of Birth/Sex: Treating RN: [birth year][name][birth year] (62 y.o. F)  Zenaida Deed Primary Care Provider: Sharon Seller Other Clinician: Referring Provider: Treating Provider/Extender: Charise Killian in Treatment: 8 The following information was scribed by: Zenaida Deed The information was scribed for: Duanne Guess Verbal / Phone Orders: No Diagnosis Coding ICD-10 Coding Code Description L97.412 Non-pressure chronic ulcer of right heel and midfoot with fat layer exposed E11.621 Type 2 diabetes mellitus with foot ulcer I50.32 Chronic diastolic (congestive) heart failure E11.65 Type 2 diabetes mellitus with hyperglycemia J44.9 Chronic obstructive pulmonary disease, unspecified E66.01 Morbid (severe) obesity due to excess calories Follow-up Appointments ppointment in 2 weeks. - Dr. Lady Gary Return A Anesthetic (In clinic) Topical Lidocaine 4% applied to wound bed Bathing/ Shower/ Hygiene May shower with protection but do not get wound dressing(s) wet. Protect dressing(s) with water repellant cover (for example, large plastic bag) or a cast cover and may then take shower. Edema Control - Orders / Instructions Elevate legs to the level of the heart or above for 30 minutes daily and/or when sitting for 3-4 times a day throughout the day. Avoid standing for long periods of time. MARRAH, CESARO [name] ([id number]) [id number] Page 4 of  7 Off-Loading Heel suspension boot to: - globoped heel offloading sandal to right foot to transfer and walk with PT Prevalon Boot - prevalon boot to right foot at all times except while working with PT closely monitor foot with any rubbing to heel from shoe or boot. ; Wound Treatment Wound #3 - Calcaneus Wound Laterality: Right Cleanser: Wound Cleanser 1 x Per Day/30 Days Discharge Instructions: Cleanse the wound with wound cleanser prior to applying a clean dressing using gauze sponges, not tissue or cotton balls. Prim Dressing: Maxorb Extra Ag+ Alginate Dressing, 4x4.75 (in/in) 1 x Per Day/30 Days ary Discharge Instructions: Apply to wound bed as instructed Secondary Dressing: ALLEVYN Heel 4 1/2in x 5 1/2in / 10.5cm x 13.5cm 1 x Per Day/30 Days Discharge Instructions: Apply over primary dressing as directed. Secondary Dressing: Woven Gauze Sponge, Non-Sterile 4x4 in 1 x Per Day/30 Days Discharge Instructions: Apply over primary dressing as directed. Secured With: [employer], 4.5x3.1 (in/yd) 1 x Per Day/30 Days Discharge Instructions: Secure with Kerlix as directed. Secured With: Transpore Surgical Tape, 2x10 (in/yd) 1 x Per Day/30 Days Discharge Instructions: Secure dressing with tape as directed. Electronic Signature(s) Signed: 12/02/2023 12:24:20 PM By: Duanne Guess MD FACS Entered By: Duanne Guess on 12/02/2023 09:22:16 -------------------------------------------------------------------------------- Problem List Details Patient Name: Date of Service: [facility] [place], [place] [name]. 12/02/2023 11:15 A M Medical Record Number: [id number] Patient Account Number: [id number] Date of Birth/Sex: Treating RN: [name] (62 y.o. [name], [name] Primary Care Provider: Sharon Seller Other Clinician: Referring Provider: Treating Provider/Extender: Charise Killian in Treatment: 8 Active Problems ICD-10 Encounter Code Description Active Date  MDM Diagnosis L97.412 Non-pressure chronic ulcer of right heel and midfoot with fat layer exposed 10/[name]/2024 No Yes E11.621 Type 2 diabetes mellitus with foot ulcer 10/[name]/2024 No Yes I50.32 Chronic diastolic (congestive) heart failure 10/[name]/2024 No Yes E11.65 Type 2 diabetes mellitus with hyperglycemia 10/[name]/2024 No Yes J44.9 Chronic obstructive pulmonary disease, unspecified 10/[name]/2024 No Yes E66.01 Morbid (severe) obesity due to excess calories 10/[name]/2024 No Yes KAMBRIA, KLIMASZEWSKI [name] ([id number]) [phone].pdf Page 5 of 7 Inactive Problems Resolved Problems Electronic Signature(s) Signed: 12/02/2023 12:24:20 PM By: Duanne Guess MD FACS Entered By: Duanne Guess on 12/02/2023 09:17:26 -------------------------------------------------------------------------------- Progress Note Details Patient Name: Date of Service: [facility] [place], [place] [name]. 12/02/2023 11:15 A M Medical Record  Number: [id number] Patient Account Number: [id number] Date of Birth/Sex: Treating RN: [birth year] (62 y.o. 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 She is referred from her primary doctor after developing a refractory [name] in the folds of her lower abdominal pannus. There is no open wound here however extensive intertrigo with classic satellite lesions highly suggestive of candidal infection. This is  reasonably extensive on the lower abdominal pannus the superior part of her pubic symphysis all the way into the lower abdomen. She has been using nystatin powder and she did receive a course of Diflucan apparently for 10 days from her primary physician although I did not see this anywhere. I think things are somewhat better. Additionally the patient also brought up the fact that she has a small dime sized area on her left posterior calf and asked [place] to look at this. This is been there for about a month. She does not wear compression stockings. Last sleep she pointed out a [name] on her left buttock and left lower back she has been scratching at this. There is no open wound here per se. 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 She has chronic venous insufficiency and lymphedema here she will need stockings. The intertrigo in her lower abdominal pannus still is not much better. I gave her additional 2 weeks of Diflucan and topical nystatin. Perhaps minimally better. She is going to need to be more aggressive about separating the folds and keeping this clean and dry. She has a much better looking area under the left breast READMISSION 10/[name]/2024 ***ABI non-compressible*** This is a now 62 year old super morbidly obese type II diabetic (last hemoglobin A1c 8.5% in July 2024). She has COPD and CHF and is largely nonambulatory. She resides at [facility]. She presents today with a large ulcer on her right heel. It appears to be pressure-induced in origin. Lindaann Pascal has been treating it with Santyl with saline moistened gauze and calcium alginate. Due to failure of the wound to  improve, she has been referred to the wound care center for further evaluation and management. 10/18/2023: The wound looks markedly better this week. There is some dry skin and callus around the opening. There is slough on the surface. The malodor that was present previously has abated. She has completed her course of oral antibiotics that I prescribed in response to her polymicrobial tissue culture. 10/31/2023: The main body of the wound has improved over the past 2 weeks, however there is evidence of pressure induced deep tissue injury just the lateral aspect of her calcaneus, adjacent to the main wound. She reports that she wears the Prevalon boot throughout the day but then is placed in a different boot to sleep at night and I  am concerned this is the etiology of this damage. 12/02/2023: The wound has improved significantly over the last month. It is quite a bit smaller and more superficial. There is some slough accumulation on the surface. There is no evidence of pressure-induced deep tissue injury. [address] [name], [name] ([id number]) [id number] Page 6 of 7 Constitutional no acute distress. Vitals Time Taken: 11:[name] AM, Height: 61 in, Weight: 283 lbs, BMI: 53.5, Temperature: 97.6 F, Pulse: 76 bpm, Respiratory Rate: 18 breaths/min, Blood Pressure: 126/58 mmHg. Respiratory Normal work of breathing on room air.. General Notes: 12/02/2023: The wound has improved significantly over the last month. It is quite a bit smaller and more superficial. There is some slough accumulation on the surface. There is no evidence of pressure-induced deep tissue injury. Integumentary (Hair, Skin) Wound #3 status is Open. Original cause of wound was Blister. The date acquired was: 07/07/2023. The wound has been in treatment 8 weeks. The wound is located on the Right Calcaneus. The wound measures 1.3cm length x 1.5cm width x 0.1cm depth; 1.532cm^2 area and 0.153cm^3 volume. There  is Fat Layer (Subcutaneous Tissue) exposed. There is a medium amount of serosanguineous drainage noted. The wound margin is flat and intact. There is large (67-100%) pink, pale granulation within the wound bed. There is a small (1-33%) amount of necrotic tissue within the wound bed including Adherent Slough. The periwound skin appearance exhibited: Callus, Dry/Scaly. The periwound skin appearance did not exhibit: Crepitus, Excoriation, Induration, [name], Scarring, Maceration, Atrophie Blanche, Cyanosis, Ecchymosis, Hemosiderin Staining, Mottled, Pallor, Rubor, Erythema. Periwound temperature was noted as No Abnormality. The periwound has tenderness on palpation. Assessment Active Problems ICD-10 Non-pressure chronic ulcer of right heel and midfoot with fat layer exposed Type 2 diabetes mellitus with foot ulcer Chronic diastolic (congestive) heart failure Type 2 diabetes mellitus with hyperglycemia Chronic obstructive pulmonary disease, unspecified Morbid (severe) obesity due to excess calories Procedures Wound #3 Pre-procedure diagnosis of Wound #3 is a Diabetic Wound/Ulcer of the Lower Extremity located on the Right Calcaneus .Severity of Tissue Pre Debridement is: Fat layer exposed. There was a Excisional Skin/Subcutaneous Tissue Debridement with a total area of 1.53 sq cm performed by Duanne Guess, MD. With the following instrument(s): Curette to remove Viable and Non-Viable tissue/material. Material removed includes Subcutaneous Tissue, Slough, and Skin: Epidermis after achieving pain control using Lidocaine 4% Topical Solution. No specimens were taken. A time out was conducted at 11:50, prior to the start of the procedure. A Minimum amount of bleeding was controlled with Pressure. The procedure was tolerated well with a pain level of 0 throughout and a pain level of 0 following the procedure. Post Debridement Measurements: 1.3cm length x 1.5cm width x 0.1cm depth; 0.153cm^3  volume. Character of Wound/Ulcer Post Debridement is improved. Severity of Tissue Post Debridement is: Fat layer exposed. Post procedure Diagnosis Wound #3: Same as Pre-Procedure Plan Follow-up Appointments: Return Appointment in 2 weeks. - Dr. Lady Gary Anesthetic: (In clinic) Topical Lidocaine 4% applied to wound bed Bathing/ Shower/ Hygiene: May shower with protection but do not get wound dressing(s) wet. Protect dressing(s) with water repellant cover (for example, large plastic bag) or a cast cover and may then take shower. Edema Control - Orders / Instructions: Elevate legs to the level of the heart or above for 30 minutes daily and/or when sitting for 3-4 times a day throughout the day. Avoid standing for long periods of time. Off-Loading: Heel suspension boot to: - globoped heel offloading sandal to right foot to transfer and walk with PT  Prevalon Boot - prevalon boot to right foot at all times except while working with PT closely monitor foot with any rubbing to heel from shoe or boot. ; WOUND #3: - Calcaneus Wound Laterality: Right Cleanser: Wound Cleanser 1 x Per Day/30 Days Discharge Instructions: Cleanse the wound with wound cleanser prior to applying a clean dressing using gauze sponges, not tissue or cotton balls. Prim Dressing: Maxorb Extra Ag+ Alginate Dressing, 4x4.75 (in/in) 1 x Per Day/30 Days ary Discharge Instructions: Apply to wound bed as instructed Secondary Dressing: ALLEVYN Heel 4 1/2in x 5 1/2in / 10.5cm x 13.5cm 1 x Per Day/30 Days Discharge Instructions: Apply over primary dressing as directed. Secondary Dressing: Woven Gauze Sponge, Non-Sterile 4x4 in 1 x Per Day/30 Days Discharge Instructions: Apply over primary dressing as directed. Secured With: [employer], 4.5x3.1 (in/yd) 1 x Per Day/30 Days Discharge Instructions: Secure with Kerlix as directed. [name], [name] ([id number]) [id number] Page 7 of 7 Secured With:  Transpore Surgical Tape, 2x10 (in/yd) 1 x Per Day/30 Days Discharge Instructions: Secure dressing with tape as directed. 12/02/2023: The wound has improved significantly over the last month. It is quite a bit smaller and more superficial. There is some slough accumulation on the surface. There is no evidence of pressure-induced deep tissue injury. I used a curette to debride slough, skin, and subcutaneous tissue from the wound. Continue silver alginate with aggressive heel offloading, Prevalon boot use, and adequate protein intake. Follow-up in 2 weeks. Electronic Signature(s) Signed: 12/02/2023 12:24:20 PM By: Duanne Guess MD FACS Entered By: Duanne Guess on 12/02/2023 09:22:55 -------------------------------------------------------------------------------- SuperBill Details Patient Name: Date of Service: [facility], [place] YS [name]. 12/02/2023 Medical Record Number: [id number] Patient Account Number: [id number] Date of Birth/Sex: Treating RN: [birth year] (62 y.o. F) Primary Care Provider: Sharon Seller Other Clinician: Referring Provider: Treating Provider/Extender: Charise Killian in Treatment: 8 Diagnosis Coding ICD-10 Codes Code Description [phone] Non-pressure chronic ulcer of right heel and midfoot with fat layer exposed E11.621 Type 2 diabetes mellitus with foot ulcer I50.32 Chronic diastolic (congestive) heart failure E11.65 Type 2 diabetes mellitus with hyperglycemia J44.9 Chronic obstructive pulmonary disease, unspecified E66.01 Morbid (severe) obesity due to excess calories Facility Procedures : CPT4 Code: [id number] Description: 11042 - DEB SUBQ TISSUE 20 SQ CM/< ICD-10 Diagnosis Description L97.412 Non-pressure chronic ulcer of right heel and midfoot with fat layer exposed E11.621 Type 2 diabetes mellitus with foot ulcer I50.32 Chronic diastolic (congestive) heart  failure E11.65 Type 2 diabetes mellitus with  hyperglycemia Modifier: Quantity: 1 Physician Procedures : CPT4 Code Description Modifier [id number] 11042 - WC PHYS SUBQ TISS 20 SQ CM ICD-10 Diagnosis Description L97.412 Non-pressure chronic ulcer of right heel and midfoot with fat layer exposed E11.621 Type 2 diabetes mellitus with foot ulcer I50.32 Chronic  diastolic (congestive) heart failure E11.65 Type 2 diabetes mellitus with hyperglycemia Quantity: 1 Electronic Signature(s) Signed: 12/02/2023 12:24:20 PM By: Duanne Guess MD FACS Entered By: Duanne Guess on 12/02/2023 09:23:15

## 2023-12-05 NOTE — Progress Notes (Deleted)
 Name: [name] DOB: 09-Aug-1961 MRN: [id number]  History of Present Illness: [name] is a 62 y.o. female who presents today for follow up visit at [facility] [place].  ***She is accompanied by ***. - GU / GYN history: 1. Recurrent UTIs. 2. OAB with urinary frequency, nocturia, urgency, and urge incontinence.  - Failed Myrbetriq  50 mg daily.  3. Stress urinary incontinence. 4. Uterine prolapse. 5. Right renal cyst. - 06/07/2023: RUS showed a exophytic 2.4 cm right renal cyst; no GU stones, masses, or hydronephrosis.  At last visit on 09/09/2023: > Had previously opted for bladder Botox  procedure but later canceled that. > Treated with Augmentin  for acutely symptomatic UTI. Urine culture positive for Proteus mirabilis. > The plan was:  - After completing Augmentin , start taking Trimethoprim  100 mg daily for UTI prevention.  - Start use of topical vaginal estrogen cream every other night (long term use) for UTI prevention. - Timed voiding every 1-2 hours.  - Minimize caffeine intake. - Return in about 3 months (around 12/10/2023) for UA, PVR, & f/u with Lauraine Oz NP.  Since last visit: ***  Today: She reports ***  She reports *** UTls since last visit. She {DENIES/REPORTS DEFAULT [id number]:53613} acute UTI symptoms today.  She {Actions; denies-reports:120008} dysuria, gross hematuria, hesitancy, straining to void, or sensations of incomplete emptying. She {Actions; denies-reports:120008} ongoing baseline urinary urgency, frequency, nocturia, urge incontinence.  She {HAS HAS WNU:81165} been using vaginal estrogen cream at a frequency of *** time(s) per week. She {Actions; denies-reports:120008} vaginal pain, bleeding, discharge.   Fall Screening: Do you usually have a device to assist in your mobility? {yes/no:20286} ***cane / ***walker / ***wheelchair   Medications: Current Outpatient Medications  Medication Sig Dispense Refill   acetaminophen   (TYLENOL ) 650 MG CR tablet Take 650 mg by mouth every 8 (eight) hours as needed for pain.     albuterol  (VENTOLIN  HFA) 108 (90 Base) MCG/ACT inhaler Inhale 2 puffs into the lungs every 6 (six) hours as needed for wheezing or shortness of breath.     alum & mag hydroxide-simeth (MAALOX/MYLANTA) 200-200-20 MG/5ML suspension Take 30 mLs by mouth every 2 (two) hours as needed for indigestion or heartburn. Do not exceed 4 doses in 24 hours     Amino Acids-Protein Hydrolys (FEEDING SUPPLEMENT, PRO-STAT 64,) LIQD Take 30 mLs by mouth daily.     amLODipine  (NORVASC ) 5 MG tablet Take 10 mg by mouth daily.     amoxicillin -clavulanate (AUGMENTIN ) 875-125 MG tablet Take 1 tablet by mouth every 12 (twelve) hours. X 3 more days     ascorbic acid  (VITAMIN C ) 500 MG tablet Take 500 mg by mouth 2 (two) times daily.     aspirin  EC 81 MG tablet Take 81 mg by mouth daily. Swallow whole.     atorvastatin  (LIPITOR) 10 MG tablet Take 10 mg by mouth at bedtime.     Biotin 10 MG TABS Take 1 tablet by mouth daily.     cetirizine (ZYRTEC) 10 MG tablet Take 10 mg by mouth daily.     Cholecalciferol  (VITAMIN D ) 50 MCG (2000 UT) CAPS Take 2,000 Units by mouth daily.     Cranberry 600 MG TABS Take 300 mg by mouth 2 (two) times daily. Half tab bid     docusate sodium  (COLACE) 100 MG capsule Take 100 mg by mouth daily.     doxycycline  (VIBRA -TABS) 100 MG tablet Take 1 tablet (100 mg total) by mouth every 12 (twelve) hours. X 3  more days     estradiol  (ESTRACE ) 0.1 MG/GM vaginal cream Discard plastic applicator. Insert a blueberry size amount (approximately 1 gram) of cream on fingertip inside vagina at bedtime every night for 1 week then every other night routinely (for long term use). 30 g 3   fluconazole  (DIFLUCAN ) 100 MG tablet Take 1 tablet (100 mg total) by mouth daily. X 5 more days     fluticasone  furoate-vilanterol (BREO ELLIPTA ) 100-25 MCG/ACT AEPB Inhale 1 puff into the lungs daily. 180 each 5   furosemide  (LASIX ) 20 MG  tablet Take 20 mg by mouth daily.     gabapentin  (NEURONTIN ) 300 MG capsule Take 1 capsule in AM, 1 capsule in PM (Patient taking differently: Take 300 mg by mouth 2 (two) times daily.) 180 capsule 3   hydrALAZINE  (APRESOLINE ) 25 MG tablet Take 1 tablet (25 mg total) by mouth every 8 (eight) hours.     ipratropium-albuterol  (DUONEB) 0.5-2.5 (3) MG/3ML SOLN Take 3 mLs by nebulization every 4 (four) hours as needed.     LANTUS  100 UNIT/ML injection Inject 0.4 mLs (40 Units total) into the skin at bedtime.     levothyroxine  (SYNTHROID ) 88 MCG tablet Take 88 mcg by mouth daily before breakfast.     Menthol-Zinc  Oxide (CALMOSEPTINE) 0.44-20.6 % OINT Apply 1 Application topically as needed (preservation/protection after incontinent care).     Multiple Vitamins-Minerals (MULTIVITAMIN WITH MINERALS) tablet Take 1 tablet by mouth daily.     MYRBETRIQ  50 MG TB24 tablet Take 1 tablet (50 mg total) by mouth daily. 30 tablet 11   NOVOLOG  100 UNIT/ML injection Inject 10-16 Units into the skin 3 (three) times daily with meals. 90-150= 10 units; 151-200= 11 units; 201-250= 12 units; 251-300= 13 units; 301-350= 14 units; 351-400= 15 units; above 400 = 16 units 45 mL 3   nystatin  cream (MYCOSTATIN ) Apply 1 Application topically 2 (two) times daily.     omeprazole (PRILOSEC) 20 MG capsule Take 20 mg by mouth daily.     ondansetron  (ZOFRAN ) 4 MG tablet Take 8 mg by mouth 2 (two) times daily.     potassium chloride  SA (K-DUR) 20 MEQ tablet Take 20 mEq by mouth daily.     propranolol  (INDERAL ) 80 MG tablet Take 80 mg by mouth daily.     saccharomyces boulardii (FLORASTOR) 250 MG capsule Take 250 mg by mouth 2 (two) times daily.     sertraline  (ZOLOFT ) 100 MG tablet Take 100 mg by mouth daily.     trimethoprim  (TRIMPEX ) 100 MG tablet Take 1 tablet (100 mg total) by mouth daily. 30 tablet 11   No current facility-administered medications for this visit.    Allergies: Allergies  Allergen Reactions   Carvedilol   Palpitations   Benicar [Olmesartan] Swelling   Codeine Other (See Comments)    Confusion    Sulfa Antibiotics Swelling    Whole face swells   Trulicity [Dulaglutide] Diarrhea   Ceftriaxone  Hives and Rash    Past Medical History:  Diagnosis Date   Anemia    Arthritis    Asthma    Cancer of sigmoid (HCC)    COPD (chronic obstructive pulmonary disease) (HCC)    Depression    Diabetes mellitus    Diastolic dysfunction 05/15/2015   Dyspnea    Generalized weakness    Hyperlipidemia    Hypertension    Hypothyroidism    Obesity    Palpitations    Pneumonia    PONV (postoperative nausea and vomiting)  Prolonged QT interval 05/14/2015   Sleep apnea    Past Surgical History:  Procedure Laterality Date   BIOPSY  04/06/2022   Procedure: BIOPSY;  Surgeon: Eartha Angelia Sieving, MD;  [facility]: [facility];  Service: Gastroenterology;;   CATARACT EXTRACTION     CESAREAN SECTION     CHOLECYSTECTOMY     COLONOSCOPY WITH PROPOFOL  N/A 04/06/2022   Procedure: COLONOSCOPY WITH PROPOFOL ;  Surgeon: Eartha Angelia Sieving, MD;  Location: [facility];  Service: Gastroenterology;  Laterality: N/A;  805 ASA 2 patient in [facility]   ESOPHAGOGASTRODUODENOSCOPY (EGD) WITH PROPOFOL  N/A 04/06/2022   Procedure: ESOPHAGOGASTRODUODENOSCOPY (EGD) WITH PROPOFOL ;  Surgeon: Eartha Angelia Sieving, MD;  [facility]: [facility];  Service: Gastroenterology;  Laterality: N/A;   HEMOSTASIS CLIP PLACEMENT  04/06/2022   Procedure: HEMOSTASIS CLIP PLACEMENT;  Surgeon: Eartha Angelia Sieving, MD;  [facility]: [facility];  Service: Gastroenterology;;   POLYPECTOMY  04/06/2022   Procedure: POLYPECTOMY;  Surgeon: Eartha Angelia, Sieving, MD;  [facility]: [facility];  Service: Gastroenterology;;   SUBMUCOSAL TATTOO INJECTION  04/06/2022   Procedure: SUBMUCOSAL TATTOO INJECTION;  Surgeon: Eartha Angelia, Sieving, MD;  Location: [facility];  Service: Gastroenterology;;   Family History   Problem Relation Age of Onset   Stroke Mother    Diabetes Father    Heart failure Father    Hypertension Father    Diabetes Sister    Heart failure Sister    Hypertension Sister    Stroke Sister    Cancer Other    Stroke Sister    Social History   Socioeconomic History   Marital status: Married    Spouse name: Not on file   Number of children: Not on file   Years of education: Not on file   Highest education level: Not on file  Occupational History   Not on file  Tobacco Use   Smoking status: Never    Passive exposure: Yes   Smokeless tobacco: Never  Vaping Use   Vaping status: Never Used  Substance and Sexual Activity   Alcohol use: No    Alcohol/week: 0.0 standard drinks of alcohol   Drug use: No   Sexual activity: Yes    Birth control/protection: None  Other Topics Concern   Not on file  Social History Narrative   Lives with husband.  One living son.  Daughter died after transplant age 69.     Are you right handed or left handed? Right   Are you currently employed ? 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    What type of home do you live in: 1 story or 2 story? one       Social Drivers of Corporate Investment Banker Strain: Medium Risk (08/14/2021)   Received from [facility]   Overall Financial Resource Strain (CARDIA)    Difficulty of Paying Living Expenses: Somewhat hard  Food Insecurity: No Food Insecurity (11/12/2023)   Hunger Vital Sign    Worried About Running Out of Food in the Last Year: Never true    Ran Out of Food in the Last Year: Never true  Transportation Needs: No Transportation Needs (11/12/2023)   PRAPARE - Administrator, Civil Service (Medical): No    Lack of Transportation (Non-Medical): No  Physical Activity: Inactive (08/14/2021)   Received from [facility]   Exercise Vital Sign  Days of Exercise per Week: 0 days    Minutes of  Exercise per Session: 0 min  Stress: Stress Concern Present (08/14/2021)   Received from [facility], [facility] - Occupational Stress Questionnaire    Feeling of Stress : Very much  Social Connections: Unknown (04/08/2022)   Received from [facility]   Social Network    Social Network: Not on file  Intimate Partner Violence: Not At Risk (11/12/2023)   Humiliation, Afraid, Rape, and Kick questionnaire    Fear of Current or Ex-Partner: No    Emotionally Abused: No    Physically Abused: No    Sexually Abused: No    Review of Systems Constitutional: Patient denies any unintentional weight loss or change in strength lntegumentary: Patient denies any rashes or pruritus Cardiovascular: Patient denies chest pain or syncope Respiratory: Patient denies shortness of breath Gastrointestinal: Patient ***denies nausea, vomiting, constipation, or diarrhea Musculoskeletal: Patient denies muscle cramps or weakness Neurologic: Patient denies convulsions or seizures Allergic/Immunologic: Patient denies recent allergic reaction(s) Hematologic/Lymphatic: Patient denies bleeding tendencies Endocrine: Patient denies heat/cold intolerance  GU: As per HPI.  OBJECTIVE There were no vitals filed for this visit. There is no height or weight on file to calculate BMI.  Physical Examination Constitutional: No obvious distress; patient is non-toxic appearing  Cardiovascular: No visible lower extremity edema.  Respiratory: The patient does not have audible wheezing/stridor; respirations do not appear labored  Gastrointestinal: Abdomen non-distended Musculoskeletal: Normal ROM of UEs  Skin: No obvious rashes/open sores  Neurologic: CN 2-12 grossly intact Psychiatric: Answered questions appropriately with normal affect  Hematologic/Lymphatic/Immunologic: No obvious bruises or sites of spontaneous bleeding  Urine microscopy: ***negative ***  WBC/hpf, *** RBC/hpf, *** bacteria UA: ***negative *** WBC/hpf, *** RBC/hpf, *** bacteria ***with no evidence of UTI ***with no evidence of microscopic hematuria ***otherwise unremarkable  PVR: *** ml  ASSESSMENT No diagnosis found. ***  Will plan for follow up in *** months / ***1 year or sooner if needed. Pt verbalized understanding and agreement. All questions were answered.  PLAN Advised the following: 1. *** 2. ***No follow-ups on file.  No orders of the defined types were placed in this encounter.   It has been explained that the patient is to follow regularly with their PCP in addition to all other providers involved in their care and to follow instructions provided by these respective offices. Patient advised to contact urology clinic if any urologic-pertaining questions, concerns, new symptoms or problems arise in the interim period.  There are no Patient Instructions on file for this visit.  Electronically signed by:  Lauraine JAYSON Oz, FNP   12/05/23    3:24 PM

## 2023-12-08 DIAGNOSIS — Z794 Long term (current) use of insulin: Secondary | ICD-10-CM | POA: Diagnosis not present

## 2023-12-08 DIAGNOSIS — Z79899 Other long term (current) drug therapy: Secondary | ICD-10-CM | POA: Diagnosis not present

## 2023-12-08 DIAGNOSIS — E1165 Type 2 diabetes mellitus with hyperglycemia: Secondary | ICD-10-CM | POA: Diagnosis not present

## 2023-12-12 ENCOUNTER — Ambulatory Visit: Payer: Medicare HMO | Admitting: Urology

## 2023-12-12 DIAGNOSIS — N3946 Mixed incontinence: Secondary | ICD-10-CM

## 2023-12-12 DIAGNOSIS — N3281 Overactive bladder: Secondary | ICD-10-CM

## 2023-12-12 DIAGNOSIS — N390 Urinary tract infection, site not specified: Secondary | ICD-10-CM

## 2023-12-13 DIAGNOSIS — Z79899 Other long term (current) drug therapy: Secondary | ICD-10-CM | POA: Diagnosis not present

## 2023-12-13 DIAGNOSIS — R52 Pain, unspecified: Secondary | ICD-10-CM | POA: Diagnosis not present

## 2023-12-13 DIAGNOSIS — L97419 Non-pressure chronic ulcer of right heel and midfoot with unspecified severity: Secondary | ICD-10-CM | POA: Diagnosis not present

## 2023-12-15 ENCOUNTER — Encounter (HOSPITAL_BASED_OUTPATIENT_CLINIC_OR_DEPARTMENT_OTHER): Payer: Medicare HMO | Attending: General Surgery | Admitting: General Surgery

## 2023-12-15 DIAGNOSIS — L97412 Non-pressure chronic ulcer of right heel and midfoot with fat layer exposed: Secondary | ICD-10-CM | POA: Diagnosis not present

## 2023-12-15 DIAGNOSIS — E1165 Type 2 diabetes mellitus with hyperglycemia: Secondary | ICD-10-CM | POA: Insufficient documentation

## 2023-12-15 DIAGNOSIS — E10621 Type 1 diabetes mellitus with foot ulcer: Secondary | ICD-10-CM | POA: Insufficient documentation

## 2023-12-15 DIAGNOSIS — E11621 Type 2 diabetes mellitus with foot ulcer: Secondary | ICD-10-CM | POA: Diagnosis not present

## 2023-12-15 DIAGNOSIS — J449 Chronic obstructive pulmonary disease, unspecified: Secondary | ICD-10-CM | POA: Diagnosis not present

## 2023-12-15 DIAGNOSIS — Z6841 Body Mass Index (BMI) 40.0 and over, adult: Secondary | ICD-10-CM | POA: Diagnosis not present

## 2023-12-15 DIAGNOSIS — I5032 Chronic diastolic (congestive) heart failure: Secondary | ICD-10-CM | POA: Diagnosis not present

## 2023-12-15 NOTE — Progress Notes (Signed)
 [name], [name] ([id number]) [id number] Page 1 of 8 Visit Report for 12/15/2023 Chief Complaint Document Details Patient Name: Date of Service: [name], [name]. 12/15/2023 11:30 A M Medical Record Number: [id number] Patient Account Number: [id number] Date of Birth/Sex: Treating RN: [birth year] (63 y.o. 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 12/15/2023 11:30 A M Medical Record Number: [id number] Patient Account Number: [id number] Date of Birth/Sex: Treating RN: [birth year] [name] (63 y.o. [name] Merleen Handing Primary Care Provider: Baird Crank Other Clinician: Referring Provider: Treating Provider/Extender: Marolyn Delon Baird Crank Devra in Treatment: 10 Debridement Performed for Assessment: Wound #3 Right Calcaneus Performed By: Physician Marolyn Delon, MD The following information was scribed by: Merleen Handing The information was scribed for: Marolyn Delon Debridement Type: Debridement Severity of Tissue Pre Debridement: Fat layer exposed Level of Consciousness (Pre-procedure): Awake and Alert Pre-procedure Verification/Time Out Yes - 12:15 Taken: Start Time: 12:15 Pain Control: Lidocaine  4% T opical  Solution Percent of Wound Bed Debrided: 100% T Area Debrided (cm): otal 1.06 Tissue and other material debrided: Viable, Non-Viable, Slough, Subcutaneous, Slough Level: Skin/Subcutaneous Tissue Debridement Description: Excisional Instrument: Curette Bleeding: Minimum Hemostasis Achieved: Pressure Procedural Pain: 0 Post Procedural Pain: 0 Response to Treatment: Procedure was tolerated well Level of Consciousness (Post- Awake and Alert procedure): Post Debridement Measurements of Total Wound Length: (cm) 0.9 [name], [name] ([id number]) [phone].pdf Page 2 of 8 Width: (cm) 1.5 Depth: (cm) 0.1 Volume: (cm) 0.106 Character of Wound/Ulcer Post Debridement: Improved Severity of Tissue Post Debridement: Fat layer exposed Post Procedure Diagnosis Same as Pre-procedure Electronic Signature(s) Signed: 12/15/2023 12:54:41 PM By: Marolyn Delon MD FACS Signed: 12/15/2023 4:46:16 PM By: Merleen Handing RN, BSN Entered By: Merleen Handing on 12/15/2023 09:16:46 -------------------------------------------------------------------------------- HPI Details Patient Name: Date of Service: [name], [name]. 12/15/2023 11:30 A M Medical Record Number: [id number] Patient Account Number: [id number] Date of Birth/Sex: Treating RN: [name][birth year] (63 y.o. F) Primary Care Provider: Baird Crank Other Clinician: Referring Provider: Treating Provider/Extender: Marolyn Delon Baird Crank Devra in Treatment: 10 History of Present Illness HPI Description: ADMISSION 10/09/2020 This is a 63 year old woman who is a type I diabetic with a last hemoglobin A1c of 10. She is referred from her primary doctor after developing a refractory rash in the folds of her lower abdominal pannus. There is no open wound here however extensive intertrigo with classic satellite lesions highly suggestive of candidal infection. This is reasonably extensive on the lower abdominal  pannus the superior part of her pubic symphysis all the way into the lower abdomen. She has been using nystatin  powder and she did receive a course of Diflucan  apparently for 10 days from her primary physician although I did not see this anywhere. I think things are somewhat better. Additionally the patient also brought up the fact that she has a small dime sized area on her left posterior calf and asked us  to look at this. This is been there for about  a month. She does not wear compression stockings. Last sleep she pointed out a rash on her left buttock and left lower back she has been scratching at this. There is no open wound here per se. 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 She has chronic venous insufficiency and lymphedema here she will need stockings. The intertrigo in her lower abdominal pannus still is not much better. I gave her additional 2 weeks of Diflucan  and topical nystatin . Perhaps minimally better. She is going to need to be more aggressive about separating the folds and keeping this clean and dry. She has a much better looking area under the left breast READMISSION 10/04/2023 ***ABI non-compressible*** This is a now 63 year old super morbidly obese type II diabetic (last hemoglobin A1c 8.5% in July 2024). She has COPD and CHF and is largely nonambulatory. She resides at [facility]. She presents today with a large ulcer on her right heel. It appears to be pressure-induced in origin. Teressa Beagle has been treating it with Santyl with saline moistened gauze and calcium  alginate. Due to failure of the wound to improve, she has been referred to the wound care  [name] for further evaluation and management. 10/18/2023: The wound looks markedly better this week. There is some dry skin and callus around the opening. There is slough on the surface. The malodor that was present previously has abated. She has completed her course of oral antibiotics that I prescribed in response to her polymicrobial tissue culture. 10/31/2023: The main body of the wound has improved over the past 2 weeks, however there is evidence of pressure induced deep tissue injury just the lateral aspect of her calcaneus, adjacent to the main wound. She reports that she wears the Prevalon boot throughout the day but then is placed in a different boot to sleep at night and I am concerned this is the etiology of this damage. 12/02/2023: The wound has improved significantly over the last month. It is quite a bit smaller and more superficial. There is some slough accumulation on the surface. There is no evidence of pressure-induced deep tissue injury. 12/15/2023: The wound continues to improve. It is measuring quite a bit smaller. There is a little bit of callus and dry skin around the edges with light slough over a surface of healthy-looking granulation tissue. Electronic Signature(s) Signed: 12/15/2023 12:30:19 PM By: Marolyn Nest MD FACS Entered By: Marolyn Nest on 12/15/2023 09:30:19 Dimmer, Alize [name] ([id number]) [id number] Page 3 of 8 -------------------------------------------------------------------------------- Physical Exam Details Patient Name: Date of Service: [name], [name]. 12/15/2023 11:30 A M Medical Record Number: [id number] Patient Account Number: [id number] Date of Birth/Sex: Treating RN: [name]-[name]-[birth year] (63 y.o. F) Primary Care Provider: Baird Crank Other Clinician: Referring Provider: Treating Provider/Extender: Marolyn Nest Baird Crank Devra in Treatment: 10 Constitutional Slightly hypertensive. . . . no acute  distress. Respiratory Normal work of breathing on supplemental oxygen . Notes 12/15/2023: The wound continues to improve. It is measuring quite a bit smaller. There is a little bit of callus and dry skin around the edges with light slough over a surface of healthy-looking granulation tissue. Electronic Signature(s) Signed: 12/15/2023 12:31:14 PM By: Marolyn Nest MD FACS Entered By: Marolyn Nest on 12/15/2023 09:31:14 -------------------------------------------------------------------------------- Physician  Orders Details Patient Name: Date of Service: [name], [name]. 12/15/2023 11:30 A M Medical Record Number: [id number] Patient Account Number: [id number] Date of Birth/Sex: Treating RN: [name], [name] (63 y.o. [name], [name] Primary Care Provider: Baird Crank Other Clinician: Referring Provider: Treating Provider/Extender: Marolyn Delon Baird Crank Devra in Treatment: 10 The following information was scribed by: [name] The information was scribed for: Marolyn Delon Verbal / Phone Orders: No Diagnosis Coding ICD-10 Coding Code Description L97.412 Non-pressure chronic ulcer of right heel and midfoot with fat layer exposed E11.621 Type 2 diabetes mellitus with foot ulcer I50.32 Chronic diastolic (congestive) heart failure E11.65 Type 2 diabetes mellitus with hyperglycemia J44.9 Chronic obstructive pulmonary disease, unspecified E66.01 Morbid (severe) obesity due to excess calories Follow-up Appointments ppointment in 2 weeks. - Dr. Marolyn Return A Anesthetic (In clinic) Topical Lidocaine  4% applied to wound bed Bathing/ Shower/ Hygiene May shower with protection but do not get wound dressing(s) wet. Protect dressing(s) with water  repellant cover (for example, large plastic bag) or a cast cover and may then take shower. Edema Control - Orders / Instructions Mimbs, Diasia [name] ([id number]) [id number] Page 4 of  8 Elevate legs to the level of the heart or above for 30 minutes daily and/or when sitting for 3-4 times a day throughout the day. Avoid standing for long periods of time. Off-Loading Heel suspension boot to: - globoped heel offloading sandal to right foot to transfer and walk with PT Prevalon Boot - prevalon boot to right foot at all times except while working with PT closely monitor foot with any rubbing to heel from shoe or boot. ; Wound Treatment Wound #3 - Calcaneus Wound Laterality: Right Cleanser: Wound Cleanser 1 x Per Day/30 Days Discharge Instructions: Cleanse the wound with wound cleanser prior to applying a clean dressing using gauze sponges, not tissue or cotton balls. Prim Dressing: Maxorb Extra Ag+ Alginate Dressing, 4x4.75 (in/in) 1 x Per Day/30 Days ary Discharge Instructions: Apply to wound bed as instructed Secondary Dressing: ALLEVYN Heel 4 1/2in x 5 1/2in / 10.5cm x 13.5cm 1 x Per Day/30 Days Discharge Instructions: Apply over primary dressing as directed. Secondary Dressing: Woven Gauze Sponge, Non-Sterile 4x4 in 1 x Per Day/30 Days Discharge Instructions: Apply over primary dressing as directed. Secured With: [employer], 4.5x3.1 (in/yd) 1 x Per Day/30 Days Discharge Instructions: Secure with Kerlix as directed. Secured With: Transpore Surgical Tape, 2x10 (in/yd) 1 x Per Day/30 Days Discharge Instructions: Secure dressing with tape as directed. Electronic Signature(s) Signed: 12/15/2023 12:54:41 PM By: Marolyn Delon MD FACS Entered By: Marolyn Delon on 12/15/2023 09:31:32 -------------------------------------------------------------------------------- Problem List Details Patient Name: Date of Service: [name], [name]. 12/15/2023 11:30 A M Medical Record Number: [id number] Patient Account Number: [id number] Date of Birth/Sex: Treating RN: [name]/[birth year]/[name] (63 y.o. [name], [name] Primary Care Provider: Baird Crank Other Clinician: Referring  Provider: Treating Provider/Extender: Marolyn Delon Baird Crank Devra in Treatment: 10 Active Problems ICD-10 Encounter Code Description Active Date MDM Diagnosis L97.412 Non-pressure chronic ulcer of right heel and midfoot with fat layer exposed 10/04/2023 No Yes E11.621 Type 2 diabetes mellitus with foot ulcer 10/04/2023 No Yes I50.32 Chronic diastolic (congestive) heart failure 10/04/2023 No Yes E11.65 Type 2 diabetes mellitus with hyperglycemia 10/04/2023 No Yes J44.9 Chronic obstructive pulmonary disease, unspecified 10/04/2023 No Yes E66.01 Morbid (severe) obesity due to excess calories 10/04/2023 No Yes Agostinelli, Kortnee [name] ([id number]) [phone].pdf Page 5 of 8 Inactive Problems Resolved Problems Electronic Signature(s) Signed: 12/15/2023 12:28:20  PM By: Marolyn Nest MD FACS Entered By: Marolyn Nest on 12/15/2023 09:28:20 -------------------------------------------------------------------------------- Progress Note Details Patient Name: Date of Service: [name] [place], [name]. 12/15/2023 11:30 A M Medical Record Number: [id number] Patient Account Number: [id number] Date of Birth/Sex: Treating RN: [birth year] (63 y.o. 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 She is referred from her primary doctor after developing a refractory rash in the folds of  her lower abdominal pannus. There is no open wound here however extensive intertrigo with classic satellite lesions highly suggestive of candidal infection. This is reasonably extensive on the lower abdominal pannus the superior part of her pubic symphysis all the way into the lower abdomen. She has been using nystatin  powder and she did receive a course of Diflucan  apparently for 10 days from her primary physician although I did not see this anywhere. I think things are somewhat better. Additionally the patient also brought up the fact that she has a small dime sized area on her left posterior calf and asked us  to look at this. This is been there for about a month. She does not wear compression stockings. Last sleep she pointed out a rash on her left buttock and left lower back she has been scratching at this. There is no open wound here per se. 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 She has chronic venous insufficiency and lymphedema here she will need stockings. The intertrigo in her lower abdominal pannus still is not much better. I gave her additional 2 weeks of Diflucan  and topical nystatin . Perhaps minimally better. She is going to need to be more aggressive about separating the folds and keeping this clean and dry. She has a much better looking area under the left breast READMISSION 10/04/2023 ***ABI non-compressible*** This is a now 63 year old super morbidly obese type II diabetic (last hemoglobin A1c 8.5% in July 2024). She has COPD and CHF and is largely nonambulatory. She resides at [name]. She presents today with a large ulcer on her right heel. It appears  to be pressure-induced in origin. Teressa Beagle has been treating it with Santyl with saline moistened gauze and calcium  alginate. Due to failure of the wound to improve, she has been referred to the wound care [name] for further evaluation and management. 10/18/2023: The wound looks markedly better this week. There is some dry skin and callus around the opening. There is slough on the surface. The malodor that was present previously has abated. She has completed her course of oral antibiotics that I prescribed in response to her polymicrobial tissue culture. 10/31/2023: The main body of the wound has improved over the past 2 weeks, however there is evidence of pressure induced deep tissue injury just the  lateral aspect of her calcaneus, adjacent to the main wound. She reports that she wears the Prevalon boot throughout the day but then is placed in a different boot to sleep at night and I am concerned this is the etiology of this damage. 12/02/2023: The wound has improved significantly over the last month. It is quite a bit smaller and more superficial. There is some slough accumulation on the surface. There is no evidence of pressure-induced deep tissue injury. 12/15/2023: The wound continues to improve. It is measuring quite a bit smaller. There is a little bit of callus and dry skin around the edges with light slough over a surface of healthy-looking granulation tissue. Strickler, Kaytie [name] ([id number]) [id number] Page 6 of 8 Objective Constitutional Slightly hypertensive. no acute distress. Vitals Time Taken: 11:59 AM, Height: 61 in, Weight: 283 lbs, BMI: 53.5, Temperature: 97.5 F, Pulse: 72 bpm, Respiratory Rate: 18 breaths/min, Blood Pressure: 142/68 mmHg, Capillary Blood Glucose: 489 mg/dl. General Notes: glucose per pt report this am. facility adjusting meds Respiratory Normal work of breathing on supplemental oxygen . General Notes: 12/15/2023: The wound continues  to improve. It is measuring quite a bit smaller. There is a little bit of callus and dry skin around the edges with light slough over a surface of healthy-looking granulation tissue. Integumentary (Hair, Skin) Wound #3 status is Open. Original cause of wound was Blister. The date acquired was: 07/07/2023. The wound has been in treatment 10 weeks. The wound is located on the Right Calcaneus. The wound measures 0.9cm length x 1.5cm width x 0.1cm depth; 1.06cm^2 area and 0.106cm^3 volume. There is Fat Layer (Subcutaneous Tissue) exposed. There is no tunneling or undermining noted. There is a medium amount of serosanguineous drainage noted. The wound margin is flat and intact. There is large (67-100%) red, pink granulation within the wound bed. There is a small (1-33%) amount of necrotic tissue within the wound bed including Adherent Slough. The periwound skin appearance had no abnormalities noted for texture. The periwound skin appearance had no abnormalities noted for moisture. The periwound skin appearance had no abnormalities noted for color. Periwound temperature was noted as No Abnormality. Assessment Active Problems ICD-10 Non-pressure chronic ulcer of right heel and midfoot with fat layer exposed Type 2 diabetes mellitus with foot ulcer Chronic diastolic (congestive) heart failure Type 2 diabetes mellitus with hyperglycemia Chronic obstructive pulmonary disease, unspecified Morbid (severe) obesity due to excess calories Procedures Wound #3 Pre-procedure diagnosis of Wound #3 is a Diabetic Wound/Ulcer of the Lower Extremity located on the Right Calcaneus .Severity of Tissue Pre Debridement is: Fat layer exposed. There was a Excisional Skin/Subcutaneous Tissue Debridement with a total area of 1.06 sq cm performed by Marolyn Nest, MD. With the following instrument(s): Curette to remove Viable and Non-Viable tissue/material. Material removed includes Subcutaneous Tissue and Slough and after  achieving pain control using Lidocaine  4% Topical Solution. No specimens were taken. A time out was conducted at 12:15, prior to the start of the procedure. A Minimum amount of bleeding was controlled with Pressure. The procedure was tolerated well with a pain level of 0 throughout and a pain level of 0 following the procedure. Post Debridement Measurements: 0.9cm length x 1.5cm width x 0.1cm depth; 0.106cm^3 volume. Character of Wound/Ulcer Post Debridement is improved. Severity of Tissue Post Debridement is: Fat layer exposed. Post procedure Diagnosis Wound #3: Same as Pre-Procedure Plan Follow-up Appointments: Return Appointment in 2 weeks. - Dr. Marolyn Anesthetic: (In clinic) Topical Lidocaine  4% applied to wound bed Bathing/ Shower/  Hygiene: May shower with protection but do not get wound dressing(s) wet. Protect dressing(s) with water  repellant cover (for example, large plastic bag) or a cast cover and may then take shower. Edema Control - Orders / Instructions: Elevate legs to the level of the heart or above for 30 minutes daily and/or when sitting for 3-4 times a day throughout the day. Avoid standing for long periods of time. Off-Loading: Heel suspension boot to: - globoped heel offloading sandal to right foot to transfer and walk with PT Prevalon Boot - prevalon boot to right foot at all times except while working with PT closely monitor foot with any rubbing to heel from shoe or boot. ; WOUND #3: - Calcaneus Wound Laterality: Right Cleanser: Wound Cleanser 1 x Per Day/30 Days Discharge Instructions: Cleanse the wound with wound cleanser prior to applying a clean dressing using gauze sponges, not tissue or cotton balls. Prim Dressing: Maxorb Extra Ag+ Alginate Dressing, 4x4.75 (in/in) 1 x Per Day/30 Days ary Discharge Instructions: Apply to wound bed as instructed Secondary Dressing: ALLEVYN Heel 4 1/2in x 5 1/2in / 10.5cm x 13.5cm 1 x Per Day/30 Days Klunk, Daleigh [name]  ([id number]) [phone].pdf Page 7 of 8 Discharge Instructions: Apply over primary dressing as directed. Secondary Dressing: Woven Gauze Sponge, Non-Sterile 4x4 in 1 x Per Day/30 Days Discharge Instructions: Apply over primary dressing as directed. Secured With: [employer], 4.5x3.1 (in/yd) 1 x Per Day/30 Days Discharge Instructions: Secure with Kerlix as directed. Secured With: Transpore Surgical T ape, 2x10 (in/yd) 1 x Per Day/30 Days Discharge Instructions: Secure dressing with tape as directed. 12/15/2023: The wound continues to improve. It is measuring quite a bit smaller. There is a little bit of callus and dry skin around the edges with light slough over a surface of healthy-looking granulation tissue. I used a curette to debride callus, slough, and subcutaneous tissue from the wound. We will continue silver  alginate with a foam heel cup and continuous use of a Prevalon boot except when working with physical therapy. She will follow-up in 2 weeks. Electronic Signature(s) Signed: 12/15/2023 12:32:52 PM By: Marolyn Nest MD FACS Entered By: Marolyn Nest on 12/15/2023 09:32:51 -------------------------------------------------------------------------------- SuperBill Details Patient Name: Date of Service: [name], [name]. 12/15/2023 Medical Record Number: [id number] Patient Account Number: [id number] Date of Birth/Sex: Treating RN: [name] (63 y.o. F) Primary Care Provider: Baird Crank Other Clinician: Referring Provider: Treating Provider/Extender: Marolyn Nest Baird Crank Devra in Treatment: 10 Diagnosis Coding ICD-10 Codes Code Description [phone] Non-pressure chronic ulcer of right heel and midfoot with fat layer exposed E11.621 Type 2 diabetes mellitus with foot ulcer I50.32 Chronic diastolic (congestive) heart failure E11.65 Type 2 diabetes mellitus with hyperglycemia J44.9 Chronic obstructive pulmonary disease,  unspecified E66.01 Morbid (severe) obesity due to excess calories Facility Procedures : CPT4 Code: [id number] Description: 11042 - DEB SUBQ TISSUE 20 SQ CM/< ICD-10 Diagnosis Description L97.412 Non-pressure chronic ulcer of right heel and midfoot with fat layer exposed E11.621 Type 2 diabetes mellitus with foot ulcer E11.65 Type 2 diabetes mellitus with  hyperglycemia I50.32 Chronic diastolic (congestive) heart failure Modifier: Quantity: 1 Physician Procedures : CPT4 Code Description Modifier [id number] 11042 - WC PHYS SUBQ TISS 20 SQ CM ICD-10 Diagnosis Description L97.412 Non-pressure chronic ulcer of right heel and midfoot with fat layer exposed E11.621 Type 2 diabetes mellitus with foot ulcer E11.65 Type 2  diabetes mellitus with hyperglycemia I50.32 Chronic diastolic (congestive) heart failure Quantity: 1 Electronic Signature(s) Signed: 12/15/2023 12:33:22 PM By: Marolyn,  Delon MD FACS NAYAK, Astor [name] ([id number]) Marolyn Delon MD FACS [phone].pdf Page 8 of 8 Signed: 12/15/2023 12:33:22 PM By: Loretha By: Marolyn Delon on 12/15/2023 09:33:22

## 2023-12-15 NOTE — Progress Notes (Signed)
 [name], [name] ([id number]) [id number] Page 1 of 8 Visit Report for 12/15/2023 Arrival Information Details Patient Name: Date of Service: [name], [name], [name]. 12/15/2023 11:30 A M Medical Record Number: [id number] Patient Account Number: [id number] Date of Birth/Sex: Treating RN: [birth year] (63 y.o. F) Primary Care [name]: [name] Other Clinician: Referring [name]: Treating [name]/Extender: [name] [name] [name] in Treatment: 10 Visit Information History Since Last Visit Added or deleted any medications: No Patient Arrived: Wheel Chair Any new allergies or adverse reactions: No Arrival Time: 11:58 Had a fall or experienced change in No Accompanied By: husband activities of daily living that may affect Transfer Assistance: None risk of falls: Patient Identification Verified: Yes Signs or symptoms of abuse/neglect since last visito No Secondary Verification Process Completed: Yes Hospitalized since last visit: No Patient Requires Transmission-Based Precautions: No Implantable device outside of Teresa clinic excluding No Patient Has Alerts: Yes cellular tissue based products placed in Teresa center Patient Alerts: R ABI N/C since last visit: Has Dressing in Place as Prescribed: Yes Has Footwear/Offloading in Place as Prescribed: Yes Right: Multipodus Split/Boot Pain Present Now: No Electronic Signature(s) Signed: 12/15/2023 4:46:16 PM By: Teresa Handing RN, BSN Entered By: [name] on 12/15/2023 09:05:04 -------------------------------------------------------------------------------- Encounter Discharge Information Details Patient Name: Date of Service: [facility], [name]. 12/15/2023 11:30 A M Medical Record Number: [id number] Patient Account Number: [id number] Date of Birth/Sex: Treating RN: [name] (63 y.o. [name] [name] Primary Care [name]: [name] Other Clinician: Referring  Jasyn Mey: Treating [name]/Extender: [name] [name] [name] in Treatment: 10 Encounter Discharge Information Items Post Procedure Vitals Discharge Condition: Stable Temperature (F): 97.5 [name] Status: Wheelchair Pulse (bpm): 72 Discharge Destination: Skilled Nursing Facility Respiratory Rate (breaths/min): 18 Telephoned: No Blood Pressure (mmHg): 142/68 Orders Sent: Yes Transportation: Other Accompanied By: spouse Schedule Follow-up Appointment: Yes Clinical Summary of Care: Patient Declined Notes facility transportation Electronic Signature(s) Signed: 12/15/2023 4:46:16 PM By: Teresa Handing RN, BSN Entered By: Boehlein, Linda on 12/15/2023 09:30:42 Goughnour, [name] ([id number]) [id number] Page 2 of 8 -------------------------------------------------------------------------------- Lower Extremity Assessment Details Patient Name: Date of Service: [name], [name]. 12/15/2023 11:30 A M Medical Record Number: [id number] Patient Account Number: [id number] Date of Birth/Sex: Treating RN: [name] (63 y.o. [name] [name] Primary Care [name]: [name] Other Clinician: Referring [name]: Treating [name]/Extender: [name] [name] [name] in Treatment: 10 Edema Assessment Assessed: [name]: No] [Right: No] Edema: [Left: Ye] [Right: s] Calf Left: Right: Point of Measurement: From Medial Instep 37.4 cm Ankle Left: Right: Point of Measurement: From Medial Instep 20.5 cm Vascular Assessment Pulses: Dorsalis Pedis Palpable: [Right:Yes] Extremity colors, hair growth, and conditions: Extremity Color: [Right:Hyperpigmented] Hair Growth on Extremity: [Right:No] Temperature of Extremity: [Right:Warm] Capillary Refill: [Right:< 3 seconds] Dependent Rubor: [Right:No No] Electronic Signature(s) Signed: 12/15/2023 4:46:16 PM By: Teresa Handing RN, BSN Entered By: [name] on  12/15/2023 09:06:10 -------------------------------------------------------------------------------- Multi Wound Chart Details Patient Name: Date of Service: [name], [name]. 12/15/2023 11:30 A M Medical Record Number: [id number] Patient Account Number: [id number] Date of Birth/Sex: Treating RN: [birth year] (62 y.o. 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 Etiology: Extremity Cataracts, Anemia, Asthma, Chronic N/A N/A Comorbid History: Obstructive Pulmonary Disease (COPD), Sleep Apnea, Congestive Heart Failure, Coronary Artery Disease, Hypertension, Peripheral Venous Disease, Type II Diabetes, Osteoarthritis, Neuropathy, Confinement Anxiety 07/07/2023 N/A N/A Date Acquired: 10 N/A N/A Weeks of Treatment: Open N/A N/A Wound Status: No N/A N/A Wound Recurrence: Yes N/A N/A Clustered Wound: 2 N/A N/A Clustered Quantity: 0.9x1.5x0.1 N/A N/A Measurements L x W x D (cm) 1.06 N/A N/A A (cm) : rea 0.106 N/A N/A Volume (cm) : 95.40% N/A N/A % Reduction in A rea: 95.40% N/A N/A % Reduction in Volume: Grade 2 N/A N/A Classification: Medium N/A N/A Exudate A mount: Serosanguineous N/A N/A Exudate Type: red, brown N/A N/A Exudate Color: Flat and Intact N/A N/A Wound Margin: Large (67-100%) N/A N/A Granulation A mount: Red, Pink N/A N/A Granulation Quality: Small (1-33%) N/A N/A Necrotic A  mount: Fat Layer (Subcutaneous Tissue): Yes N/A N/A Exposed Structures: Fascia: No Tendon: No Muscle: No Joint: No Bone: No Small (1-33%) N/A N/A Epithelialization: Debridement - Excisional N/A N/A Debridement: Pre-procedure Verification/Time Out 12:15 N/A N/A Taken: Lidocaine  4% Topical Solution N/A N/A Pain Control: Subcutaneous, Slough N/A N/A Tissue Debrided: Skin/Subcutaneous Tissue N/A N/A Level: 1.06 N/A N/A Debridement A (sq cm): rea Curette N/A N/A Instrument: Minimum N/A N/A Bleeding: Pressure N/A N/A Hemostasis A chieved: 0 N/A N/A Procedural Pain: 0 N/A N/A Post Procedural Pain: Procedure was tolerated well N/A N/A Debridement Treatment Response: 0.9x1.5x0.1 N/A N/A Post Debridement Measurements L x W x D (cm) 0.106 N/A N/A Post Debridement Volume: (cm) Excoriation: No N/A N/A Periwound Skin Texture: Induration: No Callus: No Crepitus: No Rash: No Scarring: No Dry/Scaly: Yes N/A N/A Periwound Skin Moisture: Maceration: No Atrophie Blanche: No N/A N/A Periwound Skin Color: Cyanosis: No Ecchymosis: No Erythema: No Hemosiderin Staining: No Mottled: No Pallor: No Rubor: No No Abnormality N/A N/A Temperature: Debridement N/A N/A Procedures Performed: Treatment Notes [name], [name] ([id number]) [phone].pdf Page 4 of 8 Electronic Signature(s) Signed: 12/15/2023 12:29:30 PM By: Teresa Nest MD FACS Entered By: Teresa Nest on 12/15/2023 09:29:29 -------------------------------------------------------------------------------- Multi-Disciplinary Care Plan Details Patient Name: Date of Service: [place] [name], [name]. 12/15/2023 11:30 A M Medical Record Number: [id number] Patient Account Number: [id number] Date of Birth/Sex: Treating RN: [birth year]-[name] (63 y.o. [name] [name] Primary Care Saadiq Poche: [name] Other Clinician: Referring Allyn Bertoni: Treating Texanna Hilburn/Extender: Teresa Nest [name] [name] in Treatment: 10 Multidisciplinary Care Plan reviewed with physician Active Inactive Nutrition Nursing Diagnoses: Impaired glucose control: actual or potential Potential for alteratiion in Nutrition/Potential for imbalanced nutrition Goals: Patient/caregiver will maintain therapeutic glucose control Date Initiated: 10/04/2023 Target Resolution Date: 12/30/2023 Goal Status: Active Interventions: Provide education on elevated blood sugars and impact on wound healing Treatment Activities: Patient referred to Primary Care Physician for further nutritional evaluation : 10/04/2023 Notes: Wound/Skin Impairment Nursing Diagnoses: Impaired tissue integrity Knowledge deficit related to ulceration/compromised skin integrity Goals: Patient/caregiver will verbalize understanding of skin care regimen Date Initiated: 10/04/2023 Target Resolution Date: 01/06/2024 Goal Status: Active Ulcer/skin breakdown will have a volume reduction of 30% by week 4 Date Initiated: 10/04/2023 Date Inactivated: 12/02/2023 Target Resolution Date: 11/10/2023 Goal Status: Unmet Unmet Reason: infection Interventions: Assess patient/caregiver ability to obtain necessary supplies Assess patient/caregiver ability to perform ulcer/skin care regimen upon admission and as needed Assess ulceration(s) every visit Provide education on ulcer and skin care Treatment Activities: Skin care regimen initiated : 10/04/2023  Topical wound management initiated : 10/04/2023 Notes: Electronic Signature(s) Signed: 12/15/2023 4:46:16 PM By: Teresa Handing RN, BSN Entered By: Boehlein, Linda on 12/15/2023 09:07:06 [name], [name] ([id number]) [id number] Page 5 of 8 -------------------------------------------------------------------------------- Pain Assessment Details Patient Name: Date of Service: [name], [name]. 12/15/2023 11:30 A M Medical Record Number:  [id number] Patient Account Number: [id number] Date of Birth/Sex: Treating RN: [birth year]-[name] (63 y.o. F) Primary Care Venda Dice: [name] Other Clinician: Referring Alizzon Dioguardi: Treating Cortez Flippen/Extender: [name] [name] [name] in Treatment: 10 Active Problems Location of Pain Severity and Description of Pain Patient Has Paino No Site Locations Rate Teresa pain. Current Pain Level: 0 Pain Management and Medication Current Pain Management: Electronic Signature(s) Signed: 12/15/2023 4:46:16 PM By: Teresa Handing RN, BSN Entered By: Boehlein, Linda on 12/15/2023 09:06:03 -------------------------------------------------------------------------------- Patient/Caregiver Education Details Patient Name: Date of Service: [name] [place] [facility] [name]. 1/9/2025andnbsp11:30 A M Medical Record Number: [id number] Patient Account Number: [id number] Date of Birth/Gender: Treating RN: [name] (63 y.o. [name] [name] Primary Care Physician: [name] Other Clinician: Referring Physician: Treating Physician/Extender: [name] [name] [name] in Treatment: 10 Education Assessment Education Provided To: Patient Education Topics Provided Elevated Blood Sugar/ Impact on Healing: Methods: Explain/Verbal Responses: Reinforcements needed, State content correctly [name], [name] ([id number]) [id number] Page 6 of 8 Pressure: Methods: Explain/Verbal Responses: Reinforcements needed, State content correctly Wound/Skin Impairment: Methods: Explain/Verbal Responses: Reinforcements needed, State content correctly Electronic Signature(s) Signed: 12/15/2023 4:46:16 PM By: Teresa Handing RN, BSN Entered By: [name] on 12/15/2023 09:07:41 -------------------------------------------------------------------------------- Wound Assessment Details Patient Name: Date of Service: [facility], [name]. 12/15/2023 11:30 A  M Medical Record Number: [id number] Patient Account Number: [id number] Date of Birth/Sex: Treating RN: [birth year] (63 y.o. 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: Wound Location: Right Calcaneus Wound Open Wounding Event: Blister Status: Date Acquired: 07/07/2023 Comorbid Cataracts, Anemia, Asthma, Chronic Obstructive Pulmonary Weeks Of Treatment: 10 History: Disease (COPD), Sleep Apnea, Congestive Heart Failure, Coronary Clustered Wound: Yes Artery Disease, Hypertension, Peripheral Venous Disease, Type II Diabetes, Osteoarthritis, Neuropathy, Confinement Anxiety Photos Wound Measurements Length: (cm) Width: (cm) Depth: (cm) Clustered Quantity: Area: (cm) Volume: (cm) 0.9 % Reduction in Area: 95.4% 1.5 % Reduction in Volume: 95.4% 0.1 Epithelialization: Small (1-33%) 2 Tunneling: No 1.06 Undermining: No 0.106 Wound Description Classification: Grade 2 Wound Margin: Flat and Intact Exudate Amount: Medium Exudate Type: Serosanguineous Exudate Color: red, brown Foul Odor After Cleansing: No Slough/Fibrino Yes Wound Bed Granulation Amount: Large (67-100%) Exposed Structure Granulation Quality: Red, Pink Fascia Exposed: No Necrotic Amount: Small (1-33%) Fat Layer (Subcutaneous Tissue) ExposedTANAISHA, [name] ([id number]) [id number] Page 7 of 8 Necrotic Quality: Adherent Slough Tendon Exposed: No Muscle Exposed: No Joint Exposed: No Bone Exposed: No Periwound Skin Texture Texture Color No Abnormalities Noted: Yes No Abnormalities Noted: Yes Moisture Temperature / Pain No Abnormalities Noted: Yes Temperature: No Abnormality Treatment Notes Wound #3 (Calcaneus) Wound Laterality: Right Cleanser Wound  Cleanser Discharge Instruction: Cleanse Teresa wound with wound cleanser prior to applying a clean dressing using gauze sponges, not tissue or cotton balls. Peri-Wound Care Topical Primary Dressing Maxorb Extra Ag+ Alginate Dressing, 4x4.75 (in/in) Discharge Instruction: Apply to wound bed as instructed Secondary Dressing ALLEVYN Heel 4 1/2in x 5 1/2in / 10.5cm x 13.5cm Discharge Instruction: Apply over primary dressing as directed. Woven Gauze  Sponge, Non-Sterile 4x4 in Discharge Instruction: Apply over primary dressing as directed. Secured With [employer], 4.5x3.1 (in/yd) Discharge Instruction: Secure with Kerlix as directed. Transpore Surgical Tape, 2x10 (in/yd) Discharge Instruction: Secure dressing with tape as directed. Compression Wrap Compression Stockings Add-Ons Electronic Signature(s) Signed: 12/15/2023 4:46:16 PM By: Boehlein, Linda RN, BSN Entered By: Boehlein, Linda on 12/15/2023 09:06:45 -------------------------------------------------------------------------------- Vitals Details Patient Name: Date of Service: [facility], [name]. 12/15/2023 11:30 A M Medical Record Number: [id number] Patient Account Number: [id number] Date of Birth/Sex: Treating RN: [name] (63 y.o. F) Primary Care Merril Nagy: [name] Other Clinician: Referring Kaeley Vinje: Treating Alannah Averhart/Extender: [name] [name] [name] in Treatment: 10 Vital Signs Time Taken: 11:59 Temperature (F): 97.5 Height (in): 61 Pulse (bpm): 72 Weight (lbs): 283 Respiratory Rate (breaths/min): 18 Body Mass Index (BMI): 53.5 Blood Pressure (mmHg): 142/68 Capillary Blood Glucose (mg/dl): 510 Reference Range: 80 - 120 mg / dl [name], [name] ([id number]) [phone].pdf Page 8 of 8 Notes glucose per pt report this am. facility adjusting meds Electronic Signature(s) Signed: 12/15/2023 4:46:16 PM By: Boehlein, Linda RN, BSN Entered By: Boehlein, Linda  on 12/15/2023 09:05:57

## 2023-12-20 DIAGNOSIS — R059 Cough, unspecified: Secondary | ICD-10-CM | POA: Diagnosis not present

## 2023-12-20 DIAGNOSIS — R058 Other specified cough: Secondary | ICD-10-CM | POA: Diagnosis not present

## 2023-12-20 DIAGNOSIS — Z79899 Other long term (current) drug therapy: Secondary | ICD-10-CM | POA: Diagnosis not present

## 2023-12-20 DIAGNOSIS — R0989 Other specified symptoms and signs involving the circulatory and respiratory systems: Secondary | ICD-10-CM | POA: Diagnosis not present

## 2023-12-26 DIAGNOSIS — J302 Other seasonal allergic rhinitis: Secondary | ICD-10-CM | POA: Diagnosis not present

## 2023-12-26 DIAGNOSIS — Z79899 Other long term (current) drug therapy: Secondary | ICD-10-CM | POA: Diagnosis not present

## 2023-12-26 DIAGNOSIS — R058 Other specified cough: Secondary | ICD-10-CM | POA: Diagnosis not present

## 2023-12-26 DIAGNOSIS — R0989 Other specified symptoms and signs involving the circulatory and respiratory systems: Secondary | ICD-10-CM | POA: Diagnosis not present

## 2023-12-26 DIAGNOSIS — R4182 Altered mental status, unspecified: Secondary | ICD-10-CM | POA: Diagnosis not present

## 2023-12-26 DIAGNOSIS — R059 Cough, unspecified: Secondary | ICD-10-CM | POA: Diagnosis not present

## 2023-12-27 DIAGNOSIS — E1122 Type 2 diabetes mellitus with diabetic chronic kidney disease: Secondary | ICD-10-CM | POA: Diagnosis not present

## 2023-12-27 DIAGNOSIS — Z79899 Other long term (current) drug therapy: Secondary | ICD-10-CM | POA: Diagnosis not present

## 2023-12-27 DIAGNOSIS — E875 Hyperkalemia: Secondary | ICD-10-CM | POA: Diagnosis not present

## 2023-12-27 DIAGNOSIS — Z794 Long term (current) use of insulin: Secondary | ICD-10-CM | POA: Diagnosis not present

## 2023-12-27 DIAGNOSIS — N1832 Chronic kidney disease, stage 3b: Secondary | ICD-10-CM | POA: Diagnosis not present

## 2023-12-28 ENCOUNTER — Ambulatory Visit (HOSPITAL_COMMUNITY)
Admission: RE | Admit: 2023-12-28 | Discharge: 2023-12-28 | Disposition: A | Discharge status: Home / Self Care | Payer: Medicare HMO | Source: Ambulatory Visit | Attending: Hematology | Admitting: Hematology

## 2023-12-28 ENCOUNTER — Inpatient Hospital Stay: Payer: Medicare HMO | Attending: Hematology

## 2023-12-28 DIAGNOSIS — C187 Malignant neoplasm of sigmoid colon: Secondary | ICD-10-CM | POA: Insufficient documentation

## 2023-12-28 DIAGNOSIS — Z85038 Personal history of other malignant neoplasm of large intestine: Secondary | ICD-10-CM | POA: Diagnosis not present

## 2023-12-28 DIAGNOSIS — Z85048 Personal history of other malignant neoplasm of rectum, rectosigmoid junction, and anus: Secondary | ICD-10-CM | POA: Insufficient documentation

## 2023-12-28 DIAGNOSIS — K449 Diaphragmatic hernia without obstruction or gangrene: Secondary | ICD-10-CM | POA: Diagnosis not present

## 2023-12-28 DIAGNOSIS — R590 Localized enlarged lymph nodes: Secondary | ICD-10-CM | POA: Diagnosis not present

## 2023-12-28 DIAGNOSIS — N281 Cyst of kidney, acquired: Secondary | ICD-10-CM | POA: Diagnosis not present

## 2023-12-28 LAB — CBC WITH DIFFERENTIAL/PLATELET
Abs Immature Granulocytes: 0.06 10*3/uL (ref 0.00–0.07)
Basophils Absolute: 0.1 10*3/uL (ref 0.0–0.1)
Basophils Relative: 1 %
Eosinophils Absolute: 0.6 10*3/uL — ABNORMAL HIGH (ref 0.0–0.5)
Eosinophils Relative: 6 %
HCT: 42.7 % (ref 36.0–46.0)
Hemoglobin: 13.7 g/dL (ref 12.0–15.0)
Immature Granulocytes: 1 %
Lymphocytes Relative: 16 %
Lymphs Abs: 1.6 10*3/uL (ref 0.7–4.0)
MCH: 27.1 pg (ref 26.0–34.0)
MCHC: 32.1 g/dL (ref 30.0–36.0)
MCV: 84.6 fL (ref 80.0–100.0)
Monocytes Absolute: 0.5 10*3/uL (ref 0.1–1.0)
Monocytes Relative: 5 %
Neutro Abs: 7.4 10*3/uL (ref 1.7–7.7)
Neutrophils Relative %: 71 %
Platelets: 318 10*3/uL (ref 150–400)
RBC: 5.05 MIL/uL (ref 3.87–5.11)
RDW: 15.4 % (ref 11.5–15.5)
WBC: 10.2 10*3/uL (ref 4.0–10.5)
nRBC: 0 % (ref 0.0–0.2)

## 2023-12-28 LAB — COMPREHENSIVE METABOLIC PANEL
ALT: 14 U/L (ref 0–44)
AST: 19 U/L (ref 15–41)
Albumin: 3.2 g/dL — ABNORMAL LOW (ref 3.5–5.0)
Alkaline Phosphatase: 102 U/L (ref 38–126)
Anion gap: 9 (ref 5–15)
BUN: 27 mg/dL — ABNORMAL HIGH (ref 8–23)
CO2: 32 mmol/L (ref 22–32)
Calcium: 9.8 mg/dL (ref 8.9–10.3)
Chloride: 97 mmol/L — ABNORMAL LOW (ref 98–111)
Creatinine, Ser: 1.64 mg/dL — ABNORMAL HIGH (ref 0.44–1.00)
GFR, Estimated: 35 mL/min — ABNORMAL LOW (ref >60)
Glucose, Bld: 268 mg/dL — ABNORMAL HIGH (ref 70–99)
Potassium: 3.8 mmol/L (ref 3.5–5.1)
Sodium: 138 mmol/L (ref 135–145)
Total Bilirubin: 0.6 mg/dL (ref 0.0–1.2)
Total Protein: 7.2 g/dL (ref 6.5–8.1)

## 2023-12-28 MED ORDER — IOHEXOL 300 MG/ML  SOLN
30.0000 mL | Freq: Once | INTRAMUSCULAR | Status: AC | PRN
  Administered 2023-12-28: 30 mL via ORAL

## 2023-12-28 MED ORDER — IOHEXOL 300 MG/ML  SOLN
100.0000 mL | Freq: Once | INTRAMUSCULAR | Status: AC | PRN
  Administered 2023-12-28: 80 mL via INTRAVENOUS

## 2023-12-29 ENCOUNTER — Encounter (HOSPITAL_BASED_OUTPATIENT_CLINIC_OR_DEPARTMENT_OTHER): Payer: Medicare HMO | Admitting: General Surgery

## 2023-12-29 DIAGNOSIS — E10621 Type 1 diabetes mellitus with foot ulcer: Secondary | ICD-10-CM | POA: Diagnosis not present

## 2023-12-29 DIAGNOSIS — Z6841 Body Mass Index (BMI) 40.0 and over, adult: Secondary | ICD-10-CM | POA: Diagnosis not present

## 2023-12-29 DIAGNOSIS — J449 Chronic obstructive pulmonary disease, unspecified: Secondary | ICD-10-CM | POA: Diagnosis not present

## 2023-12-29 DIAGNOSIS — E1165 Type 2 diabetes mellitus with hyperglycemia: Secondary | ICD-10-CM | POA: Diagnosis not present

## 2023-12-29 DIAGNOSIS — I5032 Chronic diastolic (congestive) heart failure: Secondary | ICD-10-CM | POA: Diagnosis not present

## 2023-12-29 DIAGNOSIS — E11621 Type 2 diabetes mellitus with foot ulcer: Secondary | ICD-10-CM | POA: Diagnosis not present

## 2023-12-29 DIAGNOSIS — L97412 Non-pressure chronic ulcer of right heel and midfoot with fat layer exposed: Secondary | ICD-10-CM | POA: Diagnosis not present

## 2023-12-29 LAB — CEA: CEA: 3.3 ng/mL (ref 0.0–4.7)

## 2024-01-02 DIAGNOSIS — E876 Hypokalemia: Secondary | ICD-10-CM | POA: Diagnosis not present

## 2024-01-02 DIAGNOSIS — N390 Urinary tract infection, site not specified: Secondary | ICD-10-CM | POA: Diagnosis not present

## 2024-01-03 NOTE — Progress Notes (Incomplete)
[facility] [address] [place] [address]    Clinic Day:  01/03/2024  Referring physician: [name], NP  Patient Care Team: [name], NP as PCP - General (Adult Health Nurse Practitioner) West Bali, MD (Inactive) as Consulting Physician (Gastroenterology) Doreatha Massed, MD as Medical Oncologist (Medical Oncology) Therese Sarah, RN as Oncology Nurse Navigator (Medical Oncology) Van Clines, MD as Consulting Physician (Neurology)   ASSESSMENT & PLAN:   Assessment: 1.  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  Rectal mass biopsy consistent with moderately differentiated adenocarcinoma. - CEA (04/06/2022): 25.7. - CT CAP (04/27/2022): No evidence of mass, lymphadenopathy or metastatic disease in the chest, abdomen or pelvis.  Clustered subsolid nodular opacities in the superior segment of the left lower lobe measures 0.6 cm. - Rectal MRI (05/10/2022): Lesion is in the sigmoid colon measuring 4 cm with findings for small volume extension into sigmoid mesocolon.  Intermittent association with adjacent uterus without gross invasion.  Sigmoid mesocolon node, borderline size.  Right external iliac nodes are favored to be reactive. - Low anterior resection (08/11/2022) by Dr. Maisie Fus - Pathology: Moderately differentiated adenocarcinoma, margins negative.  Site of tumor is rectosigmoid junction.  0/14 lymph nodes involved.  Negative LVI and PNI.  PT3 pN0.  MMR preserved.   2.  Social/family history: - She is a resident at [facility] nursing home for the past few months due to falls from right foot drop and numbness in the right hand, right foot and lower leg.  Previously she worked at the nursing home.  She is a non-smoker. - Maternal grandmother had colon cancer.    Plan: 1.  Stage II (T3 N0 M0)  rectosigmoid junction adenocarcinoma, MMR preserved: - Previous CT AP on 05/03/2023 showed mildly enlarged right common iliac lymph node 9 mm. - She denies any change in bowel habits.  No bleeding per rectum or melena. - Labs from 08/15/2023 reviewed by me showed normal LFTs.  CBC was grossly normal.  Creatinine elevated at 1.79.  CEA was normal at 1.8. - Recommend follow-up in 3 months with repeat CTAP with oral contrast only.  Will also repeat her CEA and LFTs.   2.  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 - Continue iron tablet daily.  Hemoglobin is 12.5.  Continue Colace.    No orders of the defined types were placed in this encounter.    Alben Deeds Teague,acting as a Neurosurgeon for Doreatha Massed, MD.,have documented all relevant documentation on the behalf of Doreatha Massed, MD,as directed by  Doreatha Massed, MD while in the presence of Doreatha Massed, MD.  ***   Prescott R Teague   1/28/202511:19 AM  CHIEF COMPLAINT:   Diagnosis: stage II rectosigmoid cancer    Cancer Staging  Cancer of sigmoid colon [facility]) Staging form: Colon and Rectum, AJCC 8th Edition - Clinical stage from 05/12/2022: Stage IIA (cT3, cN0, cM0) - Signed by Doreatha Massed, MD on 09/08/2022    Prior Therapy: Low anterior resection on 08/11/2022   Current Therapy:  Surveillance    HISTORY OF PRESENT ILLNESS:   Oncology History  Cancer of sigmoid colon (HCC)  05/12/2022 Initial Diagnosis   Cancer of sigmoid colon (HCC)   05/12/2022 Cancer Staging   Staging form: Colon and Rectum, AJCC 8th Edition - Clinical stage from 05/12/2022: Stage IIA (cT3, cN0, cM0) -  Signed by Doreatha Massed, MD on 09/08/2022 Histopathologic type: Adenocarcinoma, NOS Total positive nodes: 0 Histologic grade (G): G2 Histologic grading system: 4 grade system      INTERVAL HISTORY:   [name] is a 63 y.o. female presenting to the clinic today for follow-up of rectosigmoid  colon cancer.  She was last seen by me on 08/22/23.  Since her last visit, she underwent CT A/P on 12/28/23 that found: ill-defined heterogeneous hypodensity along the falciform ligament is once again increased from prior today measuring proximally 3.9 x 2.5 cm; stable mildly enlarged right common iliac lymph node measuring 9 mm in short axis; no new pathologically enlarged or enlarging lymph nodes identified in the abdomen or pelvis; prior partial sigmoidectomy with reanastomosis; and no new suspicious nodularity along the suture line.  She was admitted to the hospital from 11/12/23 to 11/16/23 for acute encephalopathy and lobar pneumonia. Encephalopathy was due to infectious process and hypoglycemia, which improved during her hospital stay.  Today, she states that she is doing well overall. Her appetite level is at ***%. Her energy level is at ***%.   PAST MEDICAL HISTORY:   Past Medical History: Past Medical History:  Diagnosis Date   Anemia    Arthritis    Asthma    Cancer of sigmoid (HCC)    COPD (chronic obstructive pulmonary disease) (HCC)    Depression    Diabetes mellitus    Diastolic dysfunction 05/15/2015   Dyspnea    Generalized weakness    Hyperlipidemia    Hypertension    Hypothyroidism    Obesity    Palpitations    Pneumonia    PONV (postoperative nausea and vomiting)    Prolonged QT interval 05/14/2015   Sleep apnea     Surgical History: Past Surgical History:  Procedure Laterality Date   BIOPSY  04/06/2022   Procedure: BIOPSY;  Surgeon: Dolores Frame, MD;  [facility];  Service: Gastroenterology;;   CATARACT EXTRACTION     CESAREAN SECTION     CHOLECYSTECTOMY     COLONOSCOPY WITH PROPOFOL N/A 04/06/2022   Procedure: COLONOSCOPY WITH PROPOFOL;  Surgeon: Dolores Frame, MD;  [facility];  Service: Gastroenterology;  Laterality: N/A;  805 ASA 2 patient in [facility]   ESOPHAGOGASTRODUODENOSCOPY (EGD)  WITH PROPOFOL N/A 04/06/2022   Procedure: ESOPHAGOGASTRODUODENOSCOPY (EGD) WITH PROPOFOL;  Surgeon: Dolores Frame, MD;  [facility];  Service: Gastroenterology;  Laterality: N/A;   HEMOSTASIS CLIP PLACEMENT  04/06/2022   Procedure: HEMOSTASIS CLIP PLACEMENT;  Surgeon: Dolores Frame, MD;  [facility];  Service: Gastroenterology;;   POLYPECTOMY  04/06/2022   Procedure: POLYPECTOMY;  Surgeon: Marguerita Merles, Reuel Boom, MD;  [facility];  Service: Gastroenterology;;   SUBMUCOSAL TATTOO INJECTION  04/06/2022   Procedure: SUBMUCOSAL TATTOO INJECTION;  Surgeon: Marguerita Merles, Reuel Boom, MD;  [facility];  Service: Gastroenterology;;    Social History: Social History   Socioeconomic History   Marital status: Married    Spouse name: Not on file   Number of children: Not on file   Years of education: Not on file   Highest education level: Not on file  Occupational History   Not on file  Tobacco Use   Smoking status: Never    Passive exposure: Yes   Smokeless tobacco: Never  Vaping Use   Vaping status: Never Used  Substance and Sexual Activity   Alcohol use: No    Alcohol/week: 0.0  standard drinks of alcohol   Drug use: No   Sexual activity: Yes    Birth control/protection: None  Other Topics Concern   Not on file  Social History Narrative   Lives with husband.  One living son.  Daughter died after transplant age 86.     Are you right handed or left handed? Right   Are you currently employed ? 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    What type of home do you live in: 1 story or 2 story? one       Social Drivers of Corporate investment banker Strain: Medium Risk (08/14/2021)   Received from [facility], [facility]   Overall Financial Resource Strain (CARDIA)    Difficulty of Paying Living Expenses: Somewhat hard  Food Insecurity: No Food Insecurity  (11/12/2023)   Hunger Vital Sign    Worried About Running Out of Food in the Last Year: Never true    Ran Out of Food in the Last Year: Never true  Transportation Needs: No Transportation Needs (11/12/2023)   PRAPARE - Administrator, Civil Service (Medical): No    Lack of Transportation (Non-Medical): No  Physical Activity: Inactive (08/14/2021)   [facility], [facility]   Exercise Vital Sign    Days of Exercise per Week: 0 days    Minutes of Exercise per Session: 0 min  Stress: Stress Concern Present (08/14/2021)   Received from [facility], [facility] of Occupational Health - Occupational Stress Questionnaire    Feeling of Stress : Very much  Social Connections: Unknown (04/08/2022)   Received from [facility]   Social Network    Social Network: Not on file  Intimate Partner Violence: Not At Risk (11/12/2023)   Humiliation, Afraid, Rape, and Kick questionnaire    Fear of Current or Ex-Partner: No    Emotionally Abused: No    Physically Abused: No    Sexually Abused: No    Family History: Family History  Problem Relation Age of Onset   Stroke Mother    Diabetes Father    Heart failure Father    Hypertension Father    Diabetes Sister    Heart failure Sister    Hypertension Sister    Stroke Sister    Cancer Other    Stroke Sister     Current Medications:  Current Outpatient Medications:    acetaminophen (TYLENOL) 650 MG CR tablet, Take 650 mg by mouth every 8 (eight) hours as needed for pain., Disp: , Rfl:    albuterol (VENTOLIN HFA) 108 (90 Base) MCG/ACT inhaler, Inhale 2 puffs into the lungs every 6 (six) hours as needed for wheezing or shortness of breath., Disp: , Rfl:    alum & mag hydroxide-simeth (MAALOX/MYLANTA) 200-200-20 MG/5ML suspension, Take 30 mLs by mouth every 2 (two) hours as needed for indigestion or heartburn. Do not exceed 4 doses in 24 hours, Disp: , Rfl:    Amino Acids-Protein  Hydrolys (FEEDING SUPPLEMENT, PRO-STAT 64,) LIQD, Take 30 mLs by mouth daily., Disp: , Rfl:    amLODipine (NORVASC) 5 MG tablet, Take 10 mg by mouth daily., Disp: , Rfl:    amoxicillin-clavulanate (AUGMENTIN) 875-125 MG tablet, Take 1 tablet by mouth every 12 (twelve) hours. X 3 more days, Disp: , Rfl:    ascorbic acid (VITAMIN C) 500 MG tablet, Take 500 mg  by mouth 2 (two) times daily., Disp: , Rfl:    aspirin EC 81 MG tablet, Take 81 mg by mouth daily. Swallow whole., Disp: , Rfl:    atorvastatin (LIPITOR) 10 MG tablet, Take 10 mg by mouth at bedtime., Disp: , Rfl:    Biotin 10 MG TABS, Take 1 tablet by mouth daily., Disp: , Rfl:    cetirizine (ZYRTEC) 10 MG tablet, Take 10 mg by mouth daily., Disp: , Rfl:    Cholecalciferol (VITAMIN D) 50 MCG (2000 UT) CAPS, Take 2,000 Units by mouth daily., Disp: , Rfl:    Cranberry 600 MG TABS, Take 300 mg by mouth 2 (two) times daily. Half tab bid, Disp: , Rfl:    docusate sodium (COLACE) 100 MG capsule, Take 100 mg by mouth daily., Disp: , Rfl:    doxycycline (VIBRA-TABS) 100 MG tablet, Take 1 tablet (100 mg total) by mouth every 12 (twelve) hours. X 3 more days, Disp: , Rfl:    estradiol (ESTRACE) 0.1 MG/GM vaginal cream, Discard plastic applicator. Insert a blueberry size amount (approximately 1 gram) of cream on fingertip inside vagina at bedtime every night for 1 week then every other night routinely (for long term use)., Disp: 30 g, Rfl: 3   fluconazole (DIFLUCAN) 100 MG tablet, Take 1 tablet (100 mg total) by mouth daily. X 5 more days, Disp: , Rfl:    fluticasone furoate-vilanterol (BREO ELLIPTA) 100-25 MCG/ACT AEPB, Inhale 1 puff into the lungs daily., Disp: 180 each, Rfl: 5   furosemide (LASIX) 20 MG tablet, Take 20 mg by mouth daily., Disp: , Rfl:    gabapentin (NEURONTIN) 300 MG capsule, Take 1 capsule in AM, 1 capsule in PM (Patient taking differently: Take 300 mg by mouth 2 (two) times daily.), Disp: 180 capsule, Rfl: 3   hydrALAZINE (APRESOLINE)  25 MG tablet, Take 1 tablet (25 mg total) by mouth every 8 (eight) hours., Disp: , Rfl:    ipratropium-albuterol (DUONEB) 0.5-2.5 (3) MG/3ML SOLN, Take 3 mLs by nebulization every 4 (four) hours as needed., Disp: , Rfl:    LANTUS 100 UNIT/ML injection, Inject 0.4 mLs (40 Units total) into the skin at bedtime., Disp: , Rfl:    levothyroxine (SYNTHROID) 88 MCG tablet, Take 88 mcg by mouth daily before breakfast., Disp: , Rfl:    Menthol-Zinc Oxide (CALMOSEPTINE) 0.44-20.6 % OINT, Apply 1 Application topically as needed (preservation/protection after incontinent care)., Disp: , Rfl:    Multiple Vitamins-Minerals (MULTIVITAMIN WITH MINERALS) tablet, Take 1 tablet by mouth daily., Disp: , Rfl:    MYRBETRIQ 50 MG TB24 tablet, Take 1 tablet (50 mg total) by mouth daily., Disp: 30 tablet, Rfl: 11   NOVOLOG 100 UNIT/ML injection, Inject 10-16 Units into the skin 3 (three) times daily with meals. 90-150= 10 units; 151-200= 11 units; 201-250= 12 units; 251-300= 13 units; 301-350= 14 units; 351-400= 15 units; above 400 = 16 units, Disp: 45 mL, Rfl: 3   nystatin cream (MYCOSTATIN), Apply 1 Application topically 2 (two) times daily., Disp: , Rfl:    omeprazole (PRILOSEC) 20 MG capsule, Take 20 mg by mouth daily., Disp: , Rfl:    ondansetron (ZOFRAN) 4 MG tablet, Take 8 mg by mouth 2 (two) times daily., Disp: , Rfl:    potassium chloride SA (K-DUR) 20 MEQ tablet, Take 20 mEq by mouth daily., Disp: , Rfl:    propranolol (INDERAL) 80 MG tablet, Take 80 mg by mouth daily., Disp: , Rfl:    saccharomyces boulardii (FLORASTOR) 250 MG capsule,  Take 250 mg by mouth 2 (two) times daily., Disp: , Rfl:    sertraline (ZOLOFT) 100 MG tablet, Take 100 mg by mouth daily., Disp: , Rfl:    trimethoprim (TRIMPEX) 100 MG tablet, Take 1 tablet (100 mg total) by mouth daily., Disp: 30 tablet, Rfl: 11   Allergies: Allergies  Allergen Reactions   Carvedilol Palpitations   Benicar [Olmesartan] Swelling   Codeine Other (See Comments)     Confusion    Sulfa Antibiotics Swelling    Whole face swells   Trulicity [Dulaglutide] Diarrhea   Ceftriaxone Hives and Rash    REVIEW OF SYSTEMS:   Review of Systems  Constitutional:  Negative for chills, fatigue and fever.  HENT:   Negative for lump/mass, mouth sores, nosebleeds, sore throat and trouble swallowing.   Eyes:  Negative for eye problems.  Respiratory:  Negative for cough and shortness of breath.   Cardiovascular:  Negative for chest pain, leg swelling and palpitations.  Gastrointestinal:  Negative for abdominal pain, constipation, diarrhea, nausea and vomiting.  Genitourinary:  Negative for bladder incontinence, difficulty urinating, dysuria, frequency, hematuria and nocturia.   Musculoskeletal:  Negative for arthralgias, back pain, flank pain, myalgias and neck pain.  Skin:  Negative for itching and rash.  Neurological:  Negative for dizziness, headaches and numbness.  Hematological:  Does not bruise/bleed easily.  Psychiatric/Behavioral:  Negative for depression, sleep disturbance and suicidal ideas. The patient is not nervous/anxious.   All other systems reviewed and are negative.    VITALS:   Last menstrual period 08/06/2012.  Wt Readings from Last 3 Encounters:  11/12/23 279 lb 8.7 oz (126.8 kg)  10/21/23 287 lb (130.2 kg)  09/30/23 287 lb (130.2 kg)    There is no height or weight on file to calculate BMI.  Performance status (ECOG): 1 - Symptomatic but completely ambulatory  PHYSICAL EXAM:   Physical Exam Vitals and nursing note reviewed. Exam conducted with a chaperone present.  Constitutional:      Appearance: Normal appearance.  Cardiovascular:     Rate and Rhythm: Normal rate and regular rhythm.     Pulses: Normal pulses.     Heart sounds: Normal heart sounds.  Pulmonary:     Effort: Pulmonary effort is normal.     Breath sounds: Normal breath sounds.  Abdominal:     Palpations: Abdomen is soft. There is no hepatomegaly, splenomegaly or  mass.     Tenderness: There is no abdominal tenderness.  Musculoskeletal:     Right lower leg: No edema.     Left lower leg: No edema.  Lymphadenopathy:     Cervical: No cervical adenopathy.     Right cervical: No superficial, deep or posterior cervical adenopathy.    Left cervical: No superficial, deep or posterior cervical adenopathy.     Upper Body:     Right upper body: No supraclavicular or axillary adenopathy.     Left upper body: No supraclavicular or axillary adenopathy.  Neurological:     General: No focal deficit present.     Mental Status: She is alert and oriented to person, place, and time.  Psychiatric:        Mood and Affect: Mood normal.        Behavior: Behavior normal.     LABS:      Latest Ref Rng & Units 12/28/2023   11:01 AM 11/16/2023   10:35 AM 11/12/2023    9:55 AM  CBC  WBC 4.0 - 10.5 K/uL 10.2  8.9  18.6   Hemoglobin 12.0 - 15.0 g/dL 78.2  95.6  21.3   Hematocrit 36.0 - 46.0 % 42.7  43.1  39.9   Platelets 150 - 400 K/uL 318  237  262       Latest Ref Rng & Units 12/28/2023   11:01 AM 11/15/2023    4:19 AM 11/12/2023    9:55 AM  CMP  Glucose 70 - 99 mg/dL 086  578  469   BUN 8 - 23 mg/dL 27  23  25    Creatinine 0.44 - 1.00 mg/dL 6.29  5.28  4.13   Sodium 135 - 145 mmol/L 138  139  140   Potassium 3.5 - 5.1 mmol/L 3.8  3.9  4.3   Chloride 98 - 111 mmol/L 97  102  103   CO2 22 - 32 mmol/L 32  30  30   Calcium 8.9 - 10.3 mg/dL 9.8  9.2  9.3   Total Protein 6.5 - 8.1 g/dL 7.2   6.8   Total Bilirubin 0.0 - 1.2 mg/dL 0.6   0.4   Alkaline Phos 38 - 126 U/L 102   74   AST 15 - 41 U/L 19   16   ALT 0 - 44 U/L 14   12      Lab Results  Component Value Date   CEA1 3.3 12/28/2023   /  CEA  Date Value Ref Range Status  12/28/2023 3.3 0.0 - 4.7 ng/mL Final    Comment:    (NOTE)                             Nonsmokers          <3.9                             Smokers             <5.6 Roche [employer] Electrochemiluminescence  Immunoassay (ECLIA) Values obtained with different assay methods or kits cannot be used interchangeably.  Results cannot be interpreted as absolute evidence of the presence or absence of malignant disease. Performed At: [facility] [address] [place], [place] [id number] Jolene Schimke MD ZD:[id number]    No results found for: "PSA1" No results found for: "CAN199" No results found for: "CAN125"  No results found for: "TOTALPROTELP", "ALBUMINELP", "A1GS", "A2GS", "BETS", "BETA2SER", "GAMS", "MSPIKE", "SPEI" Lab Results  Component Value Date   TIBC 225 (L) 05/04/2023   TIBC 257 01/25/2023   TIBC 257 10/13/2022   FERRITIN 47 05/04/2023   FERRITIN 44 01/25/2023   FERRITIN 18 10/13/2022   IRONPCTSAT 42 (H) 05/04/2023   IRONPCTSAT 27 01/25/2023   IRONPCTSAT 28 10/13/2022   No results found for: "LDH"   STUDIES:   CT ABDOMEN PELVIS W CONTRAST Result Date: 12/28/2023 CLINICAL DATA:  History of colon cancer, monitor. * Tracking Code: BO * EXAM: CT ABDOMEN AND PELVIS WITH CONTRAST TECHNIQUE: Multidetector CT imaging of the abdomen and pelvis was performed using the standard protocol following bolus administration of intravenous contrast. RADIATION DOSE REDUCTION: This exam was performed according to the departmental dose-optimization program which includes automated exposure control, adjustment of the mA and/or kV according to patient size and/or use of iterative reconstruction technique. CONTRAST:  30mL OMNIPAQUE IOHEXOL 300 MG/ML SOLN, 80mL OMNIPAQUE IOHEXOL 300 MG/ML SOLN COMPARISON:  Multiple priors including CT May 03, 2023 FINDINGS: Lower  chest: Motion degraded examination of the lung bases. Hepatobiliary: Ill-defined heterogeneous hypodensity along the falciform ligament is once again increased from prior today measuring proximally 3.9 x 2.5 cm on image 26/2. Gallbladder surgically absent. No biliary ductal dilation. Pancreas: No pancreatic ductal dilation or evidence of  acute inflammation. Spleen: No splenomegaly. Adrenals/Urinary Tract: Bilateral adrenal glands appear normal. No hydronephrosis. Stable benign right lower pole renal cysts. Urinary bladder is unremarkable for degree of distension. Stomach/Bowel: Radiopaque enteric contrast material traverses distal loops of small bowel. Small hiatal hernia. Pathologic dilation of small or large bowel. Evidence of acute bowel inflammation. Prior partial sigmoidectomy with reanastomosis. No new suspicious nodularity along the suture line. Vascular/Lymphatic: Aortic atherosclerosis.  Smooth IVC contours. Stable mildly enlarged right common iliac lymph node measuring 9 mm in short axis on image 63/2. No new pathologically enlarged or enlarging lymph nodes identified in the abdomen or pelvis. Reproductive: Uterus and bilateral adnexa are unremarkable. Other: No significant abdominopelvic free fluid. Musculoskeletal: No aggressive lytic or blastic lesion of bone IMPRESSION: 1. Ill-defined heterogeneous hypodensity along the falciform ligament is once again increased from prior today measuring proximally 3.9 x 2.5 cm. Findings are nonspecific and may reflect focal fatty infiltration/differential perfusion, however suggest more definitive assessment by liver protocol MRI with and without contrast. 2. Stable mildly enlarged right common iliac lymph node measuring 9 mm in short axis. No new pathologically enlarged or enlarging lymph nodes identified in the abdomen or pelvis. 3. Prior partial sigmoidectomy with reanastomosis. No new suspicious nodularity along the suture line. 4.  Aortic Atherosclerosis (ICD10-I70.0). Electronically Signed   By: Maudry Mayhew M.D.   On: 12/28/2023 14:09

## 2024-01-04 ENCOUNTER — Inpatient Hospital Stay: Payer: Medicare HMO | Admitting: Hematology

## 2024-01-05 ENCOUNTER — Ambulatory Visit: Payer: Medicare HMO | Admitting: Urology

## 2024-01-05 ENCOUNTER — Encounter: Payer: Self-pay | Admitting: Urology

## 2024-01-05 VITALS — BP 146/79 | HR 83

## 2024-01-05 DIAGNOSIS — Z87440 Personal history of urinary (tract) infections: Secondary | ICD-10-CM | POA: Diagnosis not present

## 2024-01-05 DIAGNOSIS — R300 Dysuria: Secondary | ICD-10-CM

## 2024-01-05 DIAGNOSIS — R82998 Other abnormal findings in urine: Secondary | ICD-10-CM | POA: Diagnosis not present

## 2024-01-05 DIAGNOSIS — N3281 Overactive bladder: Secondary | ICD-10-CM

## 2024-01-05 DIAGNOSIS — R8290 Unspecified abnormal findings in urine: Secondary | ICD-10-CM | POA: Diagnosis not present

## 2024-01-05 DIAGNOSIS — N390 Urinary tract infection, site not specified: Secondary | ICD-10-CM | POA: Diagnosis not present

## 2024-01-05 DIAGNOSIS — N3946 Mixed incontinence: Secondary | ICD-10-CM | POA: Diagnosis not present

## 2024-01-05 LAB — URINALYSIS, ROUTINE W REFLEX MICROSCOPIC
Bilirubin, UA: NEGATIVE
Ketones, UA: NEGATIVE
Nitrite, UA: NEGATIVE
Specific Gravity, UA: 1.02 (ref 1.005–1.030)
Urobilinogen, Ur: 0.2 mg/dL (ref 0.2–1.0)
pH, UA: 6 (ref 5.0–7.5)

## 2024-01-05 LAB — MICROSCOPIC EXAMINATION: WBC, UA: >30 /[HPF] — AB (ref 0–5)

## 2024-01-05 MED ORDER — AMOXICILLIN-POT CLAVULANATE 875-125 MG PO TABS: 1.0000 | ORAL_TABLET | Freq: Two times a day (BID) | ORAL | 0 refills | Status: AC

## 2024-01-05 NOTE — Progress Notes (Signed)
post void residual=0 ?

## 2024-01-05 NOTE — Progress Notes (Signed)
Name: [name] DOB: April 02, 1961 MRN: [id number]  History of Present Illness: [name] is a 63 y.o. female who presents today for follow up visit at [facility]. She is accompanied by her husband [name]. - GU / GYN history: 1. Recurrent UTIs. - Risk factors include: T2DM. 2. OAB with urinary frequency, nocturia, urgency, and urge incontinence.  - Likely exacerbated by Lasix use. - Taking Myrbetriq 50 mg daily.  3. Stress urinary incontinence. 4. Uterine prolapse. 5. Right renal cyst. - 06/07/2023: RUS showed a exophytic 2.4 cm right renal cyst; no GU stones, masses, or hydronephrosis.  At last visit on 09/09/2023: > Had previously opted for bladder Botox procedure but later canceled that. > Treated with Augmentin for acutely symptomatic UTI. Urine culture positive for Proteus mirabilis. > The plan was:  - After completing Augmentin, start taking Trimethoprim 100 mg daily for UTI prevention.  - Start use of topical vaginal estrogen cream every other night (long term use) for UTI prevention. - Timed voiding every 1-2 hours.  - Minimize caffeine intake. - Return in about 3 months (around 12/10/2023) for UA, PVR, & f/u with Evette Georges NP.  Today: She reports that a urine sample was obtained at [facility] on Monday 01/02/2024 and was sent for urine culture, which she was told today was positive for bacterial growth. She reports the urine sample was collected due to some mild dysuria and some small pieces of white mucous-like material in the toilet after urinating. Denies fevers, acute weakness / fatigue / mental status change.  She continues to have significant urinary frequency, nocturia, urgency, and urge incontinence daily at baseline. She continues to take Myrbetriq (Mirabegron) 50 mg daily, which she's been on for >6 months. She reports changing her pads / Depends about 4x/day. Voids 4-6 times per day and 3 times per  night on average.   She reports the staff at her SNF have been applying her topical vaginal estrogen cream every other night as prescribed.   Fall Screening: Do you usually have a device to assist in your mobility? Yes   Medications: Current Outpatient Medications  Medication Sig Dispense Refill   acetaminophen (TYLENOL) 650 MG CR tablet Take 650 mg by mouth every 8 (eight) hours as needed for pain.     albuterol (VENTOLIN HFA) 108 (90 Base) MCG/ACT inhaler Inhale 2 puffs into the lungs every 6 (six) hours as needed for wheezing or shortness of breath.     alum & mag hydroxide-simeth (MAALOX/MYLANTA) 200-200-20 MG/5ML suspension Take 30 mLs by mouth every 2 (two) hours as needed for indigestion or heartburn. Do not exceed 4 doses in 24 hours     Amino Acids-Protein Hydrolys (FEEDING SUPPLEMENT, PRO-STAT 64,) LIQD Take 30 mLs by mouth daily.     amLODipine (NORVASC) 5 MG tablet Take 10 mg by mouth daily.     amoxicillin-clavulanate (AUGMENTIN) 875-125 MG tablet Take 1 tablet by mouth 2 (two) times daily for 7 days. 14 tablet 0   ascorbic acid (VITAMIN C) 500 MG tablet Take 500 mg by mouth 2 (two) times daily.     aspirin EC 81 MG tablet Take 81 mg by mouth daily. Swallow whole.     atorvastatin (LIPITOR) 10 MG tablet Take 10 mg by mouth at bedtime.     Biotin 10 MG TABS Take 1 tablet by mouth daily.     cetirizine (ZYRTEC) 10 MG tablet Take 10 mg by mouth daily.  Cholecalciferol (VITAMIN D) 50 MCG (2000 UT) CAPS Take 2,000 Units by mouth daily.     Cranberry 600 MG TABS Take 300 mg by mouth 2 (two) times daily. Half tab bid     docusate sodium (COLACE) 100 MG capsule Take 100 mg by mouth daily.     estradiol (ESTRACE) 0.1 MG/GM vaginal cream Discard plastic applicator. Insert a blueberry size amount (approximately 1 gram) of cream on fingertip inside vagina at bedtime every night for 1 week then every other night routinely (for long term use). 30 g 3   fluconazole (DIFLUCAN) 100 MG tablet  Take 1 tablet (100 mg total) by mouth daily. X 5 more days     fluticasone furoate-vilanterol (BREO ELLIPTA) 100-25 MCG/ACT AEPB Inhale 1 puff into the lungs daily. 180 each 5   furosemide (LASIX) 20 MG tablet Take 20 mg by mouth daily.     gabapentin (NEURONTIN) 300 MG capsule Take 1 capsule in AM, 1 capsule in PM (Patient taking differently: Take 300 mg by mouth 2 (two) times daily.) 180 capsule 3   hydrALAZINE (APRESOLINE) 25 MG tablet Take 1 tablet (25 mg total) by mouth every 8 (eight) hours.     ipratropium-albuterol (DUONEB) 0.5-2.5 (3) MG/3ML SOLN Take 3 mLs by nebulization every 4 (four) hours as needed.     LANTUS 100 UNIT/ML injection Inject 0.4 mLs (40 Units total) into the skin at bedtime.     levothyroxine (SYNTHROID) 88 MCG tablet Take 88 mcg by mouth daily before breakfast.     Menthol-Zinc Oxide (CALMOSEPTINE) 0.44-20.6 % OINT Apply 1 Application topically as needed (preservation/protection after incontinent care).     Multiple Vitamins-Minerals (MULTIVITAMIN WITH MINERALS) tablet Take 1 tablet by mouth daily.     MYRBETRIQ 50 MG TB24 tablet Take 1 tablet (50 mg total) by mouth daily. 30 tablet 11   NOVOLOG 100 UNIT/ML injection Inject 10-16 Units into the skin 3 (three) times daily with meals. 90-150= 10 units; 151-200= 11 units; 201-250= 12 units; 251-300= 13 units; 301-350= 14 units; 351-400= 15 units; above 400 = 16 units 45 mL 3   nystatin cream (MYCOSTATIN) Apply 1 Application topically 2 (two) times daily.     omeprazole (PRILOSEC) 20 MG capsule Take 20 mg by mouth daily.     ondansetron (ZOFRAN) 4 MG tablet Take 8 mg by mouth 2 (two) times daily.     potassium chloride SA (K-DUR) 20 MEQ tablet Take 20 mEq by mouth daily.     propranolol (INDERAL) 80 MG tablet Take 80 mg by mouth daily.     saccharomyces boulardii (FLORASTOR) 250 MG capsule Take 250 mg by mouth 2 (two) times daily.     sertraline (ZOLOFT) 100 MG tablet Take 100 mg by mouth daily.     trimethoprim (TRIMPEX)  100 MG tablet Take 1 tablet (100 mg total) by mouth daily. 30 tablet 11   No current facility-administered medications for this visit.    Allergies: Allergies  Allergen Reactions   Carvedilol Palpitations   Benicar [Olmesartan] Swelling   Codeine Other (See Comments)    Confusion    Sulfa Antibiotics Swelling    Whole face swells   Trulicity [Dulaglutide] Diarrhea   Ceftriaxone Hives and Rash    Past Medical History:  Diagnosis Date   Anemia    Arthritis    Asthma    Cancer of sigmoid (HCC)    COPD (chronic obstructive pulmonary disease) (HCC)    Depression    Diabetes mellitus  Diastolic dysfunction 05/15/2015   Dyspnea    Generalized weakness    Hyperlipidemia    Hypertension    Hypothyroidism    Obesity    Palpitations    Pneumonia    PONV (postoperative nausea and vomiting)    Prolonged QT interval 05/14/2015   Sleep apnea    Past Surgical History:  Procedure Laterality Date   BIOPSY  04/06/2022   Procedure: BIOPSY;  Surgeon: Dolores Frame, MD;  [facility]: [facility];  Service: Gastroenterology;;   CATARACT EXTRACTION     CESAREAN SECTION     CHOLECYSTECTOMY     COLONOSCOPY WITH PROPOFOL N/A 04/06/2022   Procedure: COLONOSCOPY WITH PROPOFOL;  Surgeon: Dolores Frame, MD;  Location: [facility];  Service: Gastroenterology;  Laterality: N/A;  805 ASA 2 patient in [facility]   ESOPHAGOGASTRODUODENOSCOPY (EGD) WITH PROPOFOL N/A 04/06/2022   Procedure: ESOPHAGOGASTRODUODENOSCOPY (EGD) WITH PROPOFOL;  Surgeon: Dolores Frame, MD;  [facility]: [facility];  Service: Gastroenterology;  Laterality: N/A;   HEMOSTASIS CLIP PLACEMENT  04/06/2022   Procedure: HEMOSTASIS CLIP PLACEMENT;  Surgeon: Dolores Frame, MD;  [facility]: [facility];  Service: Gastroenterology;;   POLYPECTOMY  04/06/2022   Procedure: POLYPECTOMY;  Surgeon: Marguerita Merles, Reuel Boom, MD;  [facility]: [facility];  Service:  Gastroenterology;;   SUBMUCOSAL TATTOO INJECTION  04/06/2022   Procedure: SUBMUCOSAL TATTOO INJECTION;  Surgeon: Marguerita Merles, Reuel Boom, MD;  Location: [facility];  Service: Gastroenterology;;   Family History  Problem Relation Age of Onset   Stroke Mother    Diabetes Father    Heart failure Father    Hypertension Father    Diabetes Sister    Heart failure Sister    Hypertension Sister    Stroke Sister    Cancer Other    Stroke Sister    Social History   Socioeconomic History   Marital status: Married    Spouse name: Not on file   Number of children: Not on file   Years of education: Not on file   Highest education level: Not on file  Occupational History   Not on file  Tobacco Use   Smoking status: Never    Passive exposure: Yes   Smokeless tobacco: Never  Vaping Use   Vaping status: Never Used  Substance and Sexual Activity   Alcohol use: No    Alcohol/week: 0.0 standard drinks of alcohol   Drug use: No   Sexual activity: Yes    Birth control/protection: None  Other Topics Concern   Not on file  Social History Narrative   Lives with husband.  One living son.  Daughter died after transplant age 25.     Are you right handed or left handed? Right   Are you currently employed ? 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    What type of home do you live in: 1 story or 2 story? one       Social Drivers of Corporate investment banker Strain: Medium Risk (08/14/2021)   Received from [facility], [facility]   Overall Financial Resource Strain (CARDIA)    Difficulty of Paying Living Expenses: Somewhat hard  Food Insecurity: No Food Insecurity (11/12/2023)   Hunger Vital Sign    Worried About Running Out of Food in the Last Year: Never true    Ran Out of Food in the Last Year: Never true  Transportation Needs: No Transportation Needs (11/12/2023)   PRAPARE - Administrator, Civil Service (Medical):  No    Lack of Transportation (Non-Medical): No  Physical Activity: Inactive (08/14/2021)   Received from [facility], [facility]   Exercise Vital Sign    Days of Exercise per Week: 0 days    Minutes of Exercise per Session: 0 min  Stress: Stress Concern Present (08/14/2021)   Received from [facility], [facility] of Occupational Health - Occupational Stress Questionnaire    Feeling of Stress : Very much  Social Connections: Unknown (04/08/2022)   Received from [facility], [facility]   Social Network    Social Network: Not on file  Intimate Partner Violence: Not At Risk (11/12/2023)   Humiliation, Afraid, Rape, and Kick questionnaire    Fear of Current or Ex-Partner: No    Emotionally Abused: No    Physically Abused: No    Sexually Abused: No    Review of Systems Constitutional: Patient denies any unintentional weight loss or change in strength lntegumentary: Patient denies any rashes or pruritus Cardiovascular: Patient denies chest pain or syncope Respiratory: Patient denies shortness of breath Gastrointestinal: Patient denies nausea, vomiting, constipation, or diarrhea Musculoskeletal: Patient denies muscle cramps or weakness Neurologic: Patient denies convulsions or seizures Allergic/Immunologic: Patient denies recent allergic reaction(s) Hematologic/Lymphatic: Patient denies bleeding tendencies Endocrine: Patient denies heat/cold intolerance  GU: As per HPI.  OBJECTIVE Vitals:   01/05/24 1435  BP: (!) 146/79  Pulse: 83   There is no height or weight on file to calculate BMI.  Physical Examination Constitutional: No obvious distress; patient is non-toxic appearing  Cardiovascular: No visible lower extremity edema.  Respiratory: The patient does not have audible wheezing/stridor; respirations do not appear labored  Gastrointestinal: Abdomen non-distended Musculoskeletal: Normal ROM of UEs  Skin: No obvious rashes/open sores   Neurologic: CN 2-12 grossly intact Psychiatric: Answered questions appropriately with normal affect  Hematologic/Lymphatic/Immunologic: No obvious bruises or sites of spontaneous bleeding  Voided Urine microscopy: >30 WBC/hpf, 3-10 RBC/hpf, moderate bacteria  I&O cathed Urine microscopy: 11-30 WBC/hpf, 0-2 RBC/hpf, moderate bacteria  PVR: 0 ml  ASSESSMENT Recurrent UTI - Plan: Urinalysis, Routine w reflex microscopic, BLADDER SCAN AMB NON-IMAGING, Ambulatory Referral For Surgery Scheduling, In and Out Cath, Urinalysis, Routine w reflex microscopic, Urine Culture, amoxicillin-clavulanate (AUGMENTIN) 875-125 MG tablet  OAB (overactive bladder) - Plan: Urinalysis, Routine w reflex microscopic, BLADDER SCAN AMB NON-IMAGING  Mixed stress and urge incontinence - Plan: Urinalysis, Routine w reflex microscopic, BLADDER SCAN AMB NON-IMAGING  Dysuria - Plan: Urine Culture, amoxicillin-clavulanate (AUGMENTIN) 875-125 MG tablet  Abnormal urinalysis - Plan: amoxicillin-clavulanate (AUGMENTIN) 875-125 MG tablet  Abnormal UA from both voided and I&O cathed urine specimens. Will check urine culture (on I&O cathed urine specimen) and treat empirically with Augmentin while awaiting culture results and sensitivities.   For her refractory OAB with urge incontinence we reviewed treatment options. She elected to continue Myrbetriq (Mirabegron) 50 mg daily and to proceed with cystoscopy with bladder Botox; surgery request submitted.   For UTI prevention will plan to continue Trimethoprim 100 mg daily, topical vaginal estrogen cream every other night, and daily cranberry supplement.  Will plan for follow up in 6 months or sooner if needed. Pt verbalized understanding and agreement. All questions were answered.  PLAN Advised the following: 1. Urine culture. 2. Start Augmentin 875-125 mg 2x/day for 7 days. 3. Hold Trimethoprim 100 mg daily while taking Augmentin then restart once Augmentin course  is  complete restart Trimethoprim 100 mg daily for UTI prophylaxis. 4. Continue Myrbetriq (Mirabegron) 50 mg daily. 5. Continue topical vaginal estrogen cream every other night. 6. Continue cranberry supplement. 7. Return for bladder Botox surgery with Dr. Ronne Binning.  Orders Placed This Encounter  Procedures   Microscopic Examination   Urine Culture   Urinalysis, Routine w reflex microscopic   Urinalysis, Routine w reflex microscopic   Ambulatory Referral For Surgery Scheduling    Referral Priority:   Routine    Referral Type:   Consultation    Referred to Provider:   Malen Gauze, MD    Number of Visits Requested:   1   In and Out Cath   BLADDER SCAN AMB NON-IMAGING   Total time spent caring for the patient today was over 40 minutes. This includes time spent on the date of the visit reviewing the patient's chart before the visit, time spent during the visit, and time spent after the visit on documentation. Over 50% of that time was spent in face-to-face time with this patient for direct counseling. E&M based on time and complexity of medical decision making.  It has been explained that the patient is to follow regularly with their PCP in addition to all other providers involved in their care and to follow instructions provided by these respective offices. Patient advised to contact urology clinic if any urologic-pertaining questions, concerns, new symptoms or problems arise in the interim period.  There are no Patient Instructions on file for this visit.  Electronically signed by:  Donnita Falls, FNP   01/05/24    4:18 PM

## 2024-01-06 LAB — URINALYSIS, ROUTINE W REFLEX MICROSCOPIC
Bilirubin, UA: NEGATIVE
Ketones, UA: NEGATIVE
Nitrite, UA: NEGATIVE
RBC, UA: NEGATIVE
Specific Gravity, UA: 1.02 (ref 1.005–1.030)
Urobilinogen, Ur: 0.2 mg/dL (ref 0.2–1.0)
pH, UA: 6 (ref 5.0–7.5)

## 2024-01-06 LAB — MICROSCOPIC EXAMINATION

## 2024-01-09 DIAGNOSIS — Z79899 Other long term (current) drug therapy: Secondary | ICD-10-CM | POA: Diagnosis not present

## 2024-01-09 DIAGNOSIS — N390 Urinary tract infection, site not specified: Secondary | ICD-10-CM | POA: Diagnosis not present

## 2024-01-09 DIAGNOSIS — N3281 Overactive bladder: Secondary | ICD-10-CM | POA: Diagnosis not present

## 2024-01-09 DIAGNOSIS — N393 Stress incontinence (female) (male): Secondary | ICD-10-CM | POA: Diagnosis not present

## 2024-01-09 LAB — URINE CULTURE

## 2024-01-10 DIAGNOSIS — L400 Psoriasis vulgaris: Secondary | ICD-10-CM | POA: Diagnosis not present

## 2024-01-10 DIAGNOSIS — I130 Hypertensive heart and chronic kidney disease with heart failure and stage 1 through stage 4 chronic kidney disease, or unspecified chronic kidney disease: Secondary | ICD-10-CM | POA: Diagnosis not present

## 2024-01-10 DIAGNOSIS — L409 Psoriasis, unspecified: Secondary | ICD-10-CM | POA: Diagnosis not present

## 2024-01-10 DIAGNOSIS — I5032 Chronic diastolic (congestive) heart failure: Secondary | ICD-10-CM | POA: Diagnosis not present

## 2024-01-11 ENCOUNTER — Encounter (HOSPITAL_BASED_OUTPATIENT_CLINIC_OR_DEPARTMENT_OTHER): Payer: Medicare HMO | Attending: General Surgery | Admitting: General Surgery

## 2024-01-11 ENCOUNTER — Telehealth: Payer: Self-pay

## 2024-01-11 ENCOUNTER — Ambulatory Visit (INDEPENDENT_AMBULATORY_CARE_PROVIDER_SITE_OTHER): Payer: Medicare HMO | Admitting: Nurse Practitioner

## 2024-01-11 ENCOUNTER — Encounter: Payer: Self-pay | Admitting: Nurse Practitioner

## 2024-01-11 ENCOUNTER — Other Ambulatory Visit: Payer: Self-pay | Admitting: Urology

## 2024-01-11 VITALS — BP 122/74 | HR 74 | Ht 61.0 in | Wt 262.8 lb

## 2024-01-11 DIAGNOSIS — I5032 Chronic diastolic (congestive) heart failure: Secondary | ICD-10-CM | POA: Diagnosis not present

## 2024-01-11 DIAGNOSIS — Z6841 Body Mass Index (BMI) 40.0 and over, adult: Secondary | ICD-10-CM | POA: Diagnosis not present

## 2024-01-11 DIAGNOSIS — E1165 Type 2 diabetes mellitus with hyperglycemia: Secondary | ICD-10-CM | POA: Insufficient documentation

## 2024-01-11 DIAGNOSIS — Z794 Long term (current) use of insulin: Secondary | ICD-10-CM

## 2024-01-11 DIAGNOSIS — E11621 Type 2 diabetes mellitus with foot ulcer: Secondary | ICD-10-CM | POA: Insufficient documentation

## 2024-01-11 DIAGNOSIS — J449 Chronic obstructive pulmonary disease, unspecified: Secondary | ICD-10-CM | POA: Diagnosis not present

## 2024-01-11 DIAGNOSIS — L97412 Non-pressure chronic ulcer of right heel and midfoot with fat layer exposed: Secondary | ICD-10-CM | POA: Insufficient documentation

## 2024-01-11 MED ORDER — LANTUS 100 UNIT/ML ~~LOC~~ SOLN: 50.0000 [IU] | Freq: Every day | SUBCUTANEOUS | 3 refills | Status: DC

## 2024-01-11 MED ORDER — NOVOLOG 100 UNIT/ML IJ SOLN: 10.0000 [IU] | Freq: Three times a day (TID) | INTRAMUSCULAR | 3 refills | Status: DC

## 2024-01-11 NOTE — Progress Notes (Signed)
 Endocrinology Follow Up Note       01/11/2024, 10:04 AM   Subjective:    Patient ID: [name], female    DOB: Aug 27, 1961.  [name] is being seen in follow up after being seen in consultation for management of currently uncontrolled symptomatic diabetes requested by  Baird Comer GAILS, NP.   Past Medical History:  Diagnosis Date   Anemia    Arthritis    Asthma    Cancer of sigmoid (HCC)    COPD (chronic obstructive pulmonary disease) (HCC)    Depression    Diabetes mellitus    Diastolic dysfunction 05/15/2015   Dyspnea    Generalized weakness    Hyperlipidemia    Hypertension    Hypothyroidism    Obesity    Palpitations    Pneumonia    PONV (postoperative nausea and vomiting)    Prolonged QT interval 05/14/2015   Sleep apnea     Past Surgical History:  Procedure Laterality Date   BIOPSY  04/06/2022   Procedure: BIOPSY;  Surgeon: Eartha Angelia Sieving, MD;  [facility];  Service: Gastroenterology;;   CATARACT EXTRACTION     CESAREAN SECTION     CHOLECYSTECTOMY     COLONOSCOPY WITH PROPOFOL  N/A 04/06/2022   Procedure: COLONOSCOPY WITH PROPOFOL ;  Surgeon: Eartha Angelia Sieving, MD;  Location: [facility];  Service: Gastroenterology;  Laterality: N/A;  [address]SA 2 patient in [facility]   ESOPHAGOGASTRODUODENOSCOPY (EGD) WITH PROPOFOL  N/A 04/06/2022   Procedure: ESOPHAGOGASTRODUODENOSCOPY (EGD) WITH PROPOFOL ;  Surgeon: Eartha Angelia Sieving, MD;  [facility];  Service: Gastroenterology;  Laterality: N/A;   HEMOSTASIS CLIP PLACEMENT  04/06/2022   Procedure: HEMOSTASIS CLIP PLACEMENT;  Surgeon: Eartha Angelia Sieving, MD;  [facility];  Service: Gastroenterology;;   POLYPECTOMY  04/06/2022   Procedure: POLYPECTOMY;  Surgeon: Eartha Angelia, Sieving, MD;  [facility];  Service: Gastroenterology;;   SUBMUCOSAL  TATTOO INJECTION  04/06/2022   Procedure: SUBMUCOSAL TATTOO INJECTION;  Surgeon: Eartha Angelia, Sieving, MD;  [facility];  Service: Gastroenterology;;    Social History   Socioeconomic History   Marital status: Married    Spouse name: Not on file   Number of children: Not on file   Years of education: Not on file   Highest education level: Not on file  Occupational History   Not on file  Tobacco Use   Smoking status: Never    Passive exposure: Yes   Smokeless tobacco: Never  Vaping Use   Vaping status: Never Used  Substance and Sexual Activity   Alcohol use: No    Alcohol/week: 0.0 standard drinks of alcohol   Drug use: No   Sexual activity: Yes    Birth control/protection: None  Other Topics Concern   Not on file  Social History Narrative   Lives with husband.  One living son.  Daughter died after transplant age 15.     Are you right handed or left handed? Right   Are you currently employed ? 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  What type of home do you live in: 1 story or 2 story? one       Social Drivers of Corporate Investment Banker Strain: Medium Risk (08/14/2021)   Received from [facility], [facility]   Overall Financial Resource Strain (CARDIA)    Difficulty of Paying Living Expenses: Somewhat hard  Food Insecurity: No Food Insecurity (11/12/2023)   Hunger Vital Sign    Worried About Running Out of Food in the Last Year: Never true    Ran Out of Food in the Last Year: Never true  Transportation Needs: No Transportation Needs (11/12/2023)   PRAPARE - Administrator, Civil Service (Medical): No    Lack of Transportation (Non-Medical): No  Physical Activity: Inactive (08/14/2021)   Received from [facility], [facility]   Exercise Vital Sign    Days of Exercise per Week: 0 days    Minutes of Exercise per Session: 0 min  Stress: Stress Concern Present (08/14/2021)    Received from [facility], [facility] - Occupational Stress Questionnaire    Feeling of Stress : Very much  Social Connections: Unknown (04/08/2022)   Received from [facility], [facility]   Social Network    Social Network: Not on file    Family History  Problem Relation Age of Onset   Stroke Mother    Diabetes Father    Heart failure Father    Hypertension Father    Diabetes Sister    Heart failure Sister    Hypertension Sister    Stroke Sister    Cancer Other    Stroke Sister     Outpatient Encounter Medications as of 01/11/2024  Medication Sig   acetaminophen  (TYLENOL ) 650 MG CR tablet Take 650 mg by mouth every 8 (eight) hours as needed for pain.   albuterol  (VENTOLIN  HFA) 108 (90 Base) MCG/ACT inhaler Inhale 2 puffs into the lungs every 6 (six) hours as needed for wheezing or shortness of breath.   alum & mag hydroxide-simeth (MAALOX/MYLANTA) 200-200-20 MG/5ML suspension Take 30 mLs by mouth every 2 (two) hours as needed for indigestion or heartburn. Do not exceed 4 doses in 24 hours   Amino Acids-Protein Hydrolys (FEEDING SUPPLEMENT, PRO-STAT 64,) LIQD Take 30 mLs by mouth daily.   amLODipine  (NORVASC ) 5 MG tablet Take 10 mg by mouth daily.   amoxicillin -clavulanate (AUGMENTIN ) 875-125 MG tablet Take 1 tablet by mouth 2 (two) times daily for 7 days.   ascorbic acid  (VITAMIN C ) 500 MG tablet Take 500 mg by mouth 2 (two) times daily.   aspirin  EC 81 MG tablet Take 81 mg by mouth daily. Swallow whole.   atorvastatin  (LIPITOR) 10 MG tablet Take 10 mg by mouth at bedtime.   Biotin 10 MG TABS Take 1 tablet by mouth daily.   cetirizine (ZYRTEC) 10 MG tablet Take 10 mg by mouth daily.   Cholecalciferol  (VITAMIN D ) 50 MCG (2000 UT) CAPS Take 2,000 Units by mouth daily.   Cranberry 600 MG TABS Take 300 mg by mouth 2 (two) times daily. Half tab bid   docusate sodium  (COLACE) 100 MG capsule Take 100 mg by mouth daily.   estradiol   (ESTRACE ) 0.1 MG/GM vaginal cream Discard plastic applicator. Insert a blueberry size amount (approximately 1 gram) of cream on fingertip inside vagina at bedtime every night for 1 week then every other night routinely (for long term use).   fluticasone  furoate-vilanterol (BREO ELLIPTA ) 100-25 MCG/ACT AEPB Inhale  1 puff into the lungs daily.   furosemide  (LASIX ) 20 MG tablet Take 20 mg by mouth daily.   gabapentin  (NEURONTIN ) 300 MG capsule Take 1 capsule in AM, 1 capsule in PM (Patient taking differently: Take 300 mg by mouth 2 (two) times daily.)   hydrALAZINE  (APRESOLINE ) 25 MG tablet Take 1 tablet (25 mg total) by mouth every 8 (eight) hours.   ipratropium-albuterol  (DUONEB) 0.5-2.5 (3) MG/3ML SOLN Take 3 mLs by nebulization every 4 (four) hours as needed.   levothyroxine  (SYNTHROID ) 88 MCG tablet Take 88 mcg by mouth daily before breakfast.   Menthol-Zinc  Oxide (CALMOSEPTINE) 0.44-20.6 % OINT Apply 1 Application topically as needed (preservation/protection after incontinent care).   Multiple Vitamins-Minerals (MULTIVITAMIN WITH MINERALS) tablet Take 1 tablet by mouth daily.   MYRBETRIQ  50 MG TB24 tablet Take 1 tablet (50 mg total) by mouth daily.   nystatin  cream (MYCOSTATIN ) Apply 1 Application topically 2 (two) times daily.   omeprazole (PRILOSEC) 20 MG capsule Take 20 mg by mouth daily.   ondansetron  (ZOFRAN ) 4 MG tablet Take 8 mg by mouth 2 (two) times daily.   potassium chloride  SA (K-DUR) 20 MEQ tablet Take 20 mEq by mouth daily.   propranolol  (INDERAL ) 80 MG tablet Take 80 mg by mouth daily.   saccharomyces boulardii (FLORASTOR) 250 MG capsule Take 250 mg by mouth 2 (two) times daily.   sertraline  (ZOLOFT ) 100 MG tablet Take 100 mg by mouth daily.   trimethoprim  (TRIMPEX ) 100 MG tablet Take 1 tablet (100 mg total) by mouth daily.   [DISCONTINUED] LANTUS  100 UNIT/ML injection Inject 0.4 mLs (40 Units total) into the skin at bedtime.   [DISCONTINUED] NOVOLOG  100 UNIT/ML injection Inject  10-16 Units into the skin 3 (three) times daily with meals. 90-150= 10 units; 151-200= 11 units; 201-250= 12 units; 251-300= 13 units; 301-350= 14 units; 351-400= 15 units; above 400 = 16 units   fluconazole  (DIFLUCAN ) 100 MG tablet Take 1 tablet (100 mg total) by mouth daily. X 5 more days (Patient not taking: Reported on 01/11/2024)   LANTUS  100 UNIT/ML injection Inject 0.5 mLs (50 Units total) into the skin at bedtime.   NOVOLOG  100 UNIT/ML injection Inject 10-16 Units into the skin 3 (three) times daily with meals. 90-150= 10 units; 151-200= 11 units; 201-250= 12 units; 251-300= 13 units; 301-350= 14 units; 351-400= 15 units; above 400 = 16 units   No facility-administered encounter medications on file as of 01/11/2024.    ALLERGIES: Allergies  Allergen Reactions   Carvedilol  Palpitations   Benicar [Olmesartan] Swelling   Codeine Other (See Comments)    Confusion    Sulfa Antibiotics Swelling    Whole face swells   Trulicity [Dulaglutide] Diarrhea   Ceftriaxone  Hives and Rash    VACCINATION STATUS: Immunization History  Administered Date(s) Administered   Influenza,inj,quad, With Preservative 09/24/2015   Pneumococcal Conjugate-13 09/02/2015   Pneumococcal Polysaccharide-23 10/01/2005   Tdap 10/01/2005, 09/02/2015    Diabetes She presents for her follow-up diabetic visit. She has type 2 diabetes mellitus. Onset time: diagnosed approx 50 years ago per husband report. Her disease course has been worsening. There are no hypoglycemic associated symptoms. (She did have one episode of hypoglycemia since last visit, no record of it on logs) Associated symptoms include blurred vision, fatigue, foot paresthesias, polydipsia, polyuria and weight loss. There are no hypoglycemic complications. Symptoms are stable. Diabetic complications include a CVA (TIA in past), nephropathy, peripheral neuropathy and PVD. Risk factors for coronary artery disease include diabetes mellitus,  dyslipidemia, family  history, obesity, hypertension and sedentary lifestyle. Current diabetic treatment includes intensive insulin  program. She is compliant with treatment most of the time (lives in SNF). She is following a generally unhealthy diet. When asked about meal planning, she reported none. She has not had a previous visit with a dietitian. She rarely participates in exercise. Her home blood glucose trend is increasing rapidly. Her breakfast blood glucose range is generally >200 mg/dl. Her lunch blood glucose range is generally >200 mg/dl. Her dinner blood glucose range is generally >200 mg/dl. Her bedtime blood glucose range is generally >200 mg/dl. Her overall blood glucose range is >200 mg/dl. (She presents today, accompanied by care attendant from the nursing home, with her readings showing severe hyperglycemia overall.  Her most recent A1c, checked on 12/8 was 8.2%, improving slightly from last visit of 8.5%.  The facility has her on Lantus  15 units in the morning 30 units in the evening, and Novolog  4-6 units TID with meals, not what I had recommended at last visit.  She did go to the hospital for PNA and foot infection since last visit so perhaps it was changed there?) An ACE inhibitor/angiotensin II receptor blocker is not being taken. She sees a podiatrist.Eye exam is current.     Review of systems  Constitutional: + steadily decreasing body weight, current Body mass index is 49.66 kg/m., no fatigue, no subjective hyperthermia, no subjective hypothermia Eyes: no blurry vision, no xerophthalmia ENT: no sore throat, no nodules palpated in throat, no dysphagia/odynophagia, no hoarseness Cardiovascular: no chest pain, no shortness of breath, no palpitations, no leg swelling Respiratory: no cough, no shortness of breath Gastrointestinal: no nausea/vomiting/diarrhea Musculoskeletal: essentially WC bound due to deconditioning, right foot in ortho boot Skin: no rashes, no hyperemia Neurological: no tremors, no  numbness, no tingling, no dizziness Psychiatric: no depression, no anxiety  Objective:     BP 122/74 (BP Location: Left Arm, Patient Position: Sitting, Cuff Size: Large)   Pulse 74   Ht 5' 1 (1.549 m)   Wt 262 lb 12.8 oz (119.2 kg)   LMP 08/06/2012   BMI 49.66 kg/m   Wt Readings from Last 3 Encounters:  01/11/24 262 lb 12.8 oz (119.2 kg)  11/12/23 279 lb 8.7 oz (126.8 kg)  10/21/23 287 lb (130.2 kg)     BP Readings from Last 3 Encounters:  01/11/24 122/74  01/05/24 (!) 146/79  11/16/23 (!) 130/57     Physical Exam- Limited  Constitutional:  Body mass index is 49.66 kg/m. , not in acute distress, normal state of mind Eyes:  EOMI, no exophthalmos Musculoskeletal: essentially WC bound due to deconditioning, right foot in ortho boot Skin:  no rashes, no hyperemia Neurological: no tremor with outstretched hands    CMP ( most recent) CMP     Component Value Date/Time   NA 138 12/28/2023 1101   K 3.8 12/28/2023 1101   CL 97 (L) 12/28/2023 1101   CO2 32 12/28/2023 1101   GLUCOSE 268 (H) 12/28/2023 1101   BUN 27 (H) 12/28/2023 1101   CREATININE 1.64 (H) 12/28/2023 1101   CALCIUM  9.8 12/28/2023 1101   PROT 7.2 12/28/2023 1101   ALBUMIN 3.2 (L) 12/28/2023 1101   AST 19 12/28/2023 1101   ALT 14 12/28/2023 1101   ALKPHOS 102 12/28/2023 1101   BILITOT 0.6 12/28/2023 1101   GFRNONAA 35 (L) 12/28/2023 1101     Diabetic Labs (most recent): Lab Results  Component Value Date   HGBA1C 8.2 (H)  11/13/2023   HGBA1C 8.5 (H) 06/27/2023   HGBA1C 7.2 (H) 08/03/2022     Lipid Panel ( most recent) Lipid Panel  No results found for: CHOL, TRIG, HDL, CHOLHDL, VLDL, LDLCALC, LDLDIRECT, LABVLDL             Assessment & Plan:   1) Type 2 diabetes mellitus with hyperglycemia, with long-term current use of insulin  (HCC)  She presents today, accompanied by care attendant from the nursing home, with her readings showing severe hyperglycemia overall.  Her  most recent A1c, checked on 12/8 was 8.2%, improving slightly from last visit of 8.5%.  The facility has her on Lantus  15 units in the morning 30 units in the evening, and Novolog  4-6 units TID with meals, not what I had recommended at last visit.  She did go to the hospital for PNA and foot infection since last visit so perhaps it was changed there?  [place] Teresa NOVAK Lickteig has currently uncontrolled symptomatic type 2 DM since her 71s.   -Recent labs reviewed.  - I had a long discussion with her about the progressive nature of diabetes and the pathology behind its complications. -her diabetes is complicated by TIA, CKD, PVD, neuropathy and she remains at a high risk for more acute and chronic complications which include CAD, CVA, CKD, retinopathy, and neuropathy. These are all discussed in detail with her.  The following Lifestyle Medicine recommendations according to American College of Lifestyle Medicine [facility]) were discussed and offered to patient and she agrees to start the journey:  A. Whole Foods, Plant-based plate comprising of fruits and vegetables, plant-based proteins, whole-grain carbohydrates was discussed in detail with the patient.   A list for source of those nutrients were also provided to the patient.  Patient will use only water  or unsweetened tea for hydration. B.  The need to stay away from risky substances including alcohol, smoking; obtaining 7 to 9 hours of restorative sleep, at least 150 minutes of moderate intensity exercise weekly, the importance of healthy social connections,  and stress reduction techniques were discussed. C.  A full color page of  Calorie density of various food groups per pound showing examples of each food groups was provided to the patient.  - Nutritional counseling repeated at each appointment due to patients tendency to fall back in to old habits.  - The patient admits there is a room for improvement in their diet and drink choices. -  Suggestion is  made for the patient to avoid simple carbohydrates from their diet including Cakes, Sweet Desserts / Pastries, Ice Cream, Soda (diet and regular), Sweet Tea, Candies, Chips, Cookies, Sweet Pastries, Store Bought Juices, Alcohol in Excess of 1-2 drinks a day, Artificial Sweeteners, Coffee Creamer, and Sugar-free Products. This will help patient to have stable blood glucose profile and potentially avoid unintended weight gain.   - I encouraged the patient to switch to unprocessed or minimally processed complex starch and increased protein intake (animal or plant source), fruits, and vegetables.   - Patient is advised to stick to a routine mealtimes to eat 3 meals a day and avoid unnecessary snacks (to snack only to correct hypoglycemia).  - I have approached her with the following individualized plan to manage her diabetes and patient agrees:   -Will adjust her Lantus  back 50 units SQ nightly and adjust her Novolog  back to 10-16 units TID with meals if glucose is above 90 and she is eating (Specific instructions on how to titrate insulin  dosage based  on glucose readings given to patient in writing).    -she is encouraged to continue monitoring glucose 4 times daily, before meals and before bed, and to call the clinic if she has readings less than 70 or above 300 for 3 tests in a row.  I requested the facility send me readings in 2 weeks so we can continue to adjust her medications to regain control of her diabetes.  - she is warned not to take insulin  without proper monitoring per orders. - Adjustment parameters are given to her for hypo and hyperglycemia in writing.  - she is not candidate for Trulicity, did not tolerate well in the past, nor Sulfa meds as she is allergic.  She is also not a candidate for Metformin given stage 3 renal failure.  - Specific targets for  A1c; LDL, HDL, and Triglycerides were discussed with the patient.  2) Blood Pressure /Hypertension:  her blood pressure is  controlled to target.   she is advised to continue her current medications including Norvasc  5 mg p.o. daily with breakfast, Lasix  20 mg po daily, Hydralazine  25 mg po TID, and Propanolol 80 mg po daily.  3) Lipids/Hyperlipidemia:    There is no recent lipid panel available to review .  she is advised to continue Lipitor 10 mg daily at bedtime.  Side effects and precautions discussed with her.  4)  Weight/Diet:  her Body mass index is 49.66 kg/m.  -  clearly complicating her diabetes care.   she is a candidate for weight loss. I discussed with her the fact that loss of 5 - 10% of her  current body weight will have the most impact on her diabetes management.  Exercise, and detailed carbohydrates information provided  -  detailed on discharge instructions.  5) Chronic Care/Health Maintenance: -she is not on ACEI/ARB and is on Statin medications and is encouraged to initiate and continue to follow up with Ophthalmology, Dentist, Podiatrist at least yearly or according to recommendations, and advised to stay away from smoking. I have recommended yearly flu vaccine and pneumonia vaccine at least every 5 years; moderate intensity exercise for up to 150 minutes weekly; and sleep for at least 7 hours a day.  - she is advised to maintain close follow up with [name], Comer GAILS, NP for primary care needs, as well as her other providers for optimal and coordinated care.     I spent  51  minutes in the care of the patient today including review of labs from CMP, Lipids, Thyroid  Function, Hematology (current and previous including abstractions from other facilities); face-to-face time discussing  her blood glucose readings/logs, discussing hypoglycemia and hyperglycemia episodes and symptoms, medications doses, her options of short and long term treatment based on the latest standards of care / guidelines;  discussion about incorporating lifestyle medicine;  and documenting the encounter. Risk reduction  counseling performed per USPSTF guidelines to reduce obesity and cardiovascular risk factors.     Please refer to Patient Instructions for Blood Glucose Monitoring and Insulin /Medications Dosing Guide  in media tab for additional information. Please  also refer to  Patient Self Inventory in the Media  tab for reviewed elements of pertinent patient history.  Teresa NOVAK Simonis participated in the discussions, expressed understanding, and voiced agreement with the above plans.  All questions were answered to her satisfaction. she is encouraged to contact clinic should she have any questions or concerns prior to her return visit.     Follow up plan: -  Return in about 3 months (around 04/09/2024) for Diabetes F/U with A1c in office, No previsit labs, Bring meter and logs.   Benton Rio, [facility] [address] [place], [place] [address] Phone: [phone] Fax: [phone]  01/11/2024, 10:04 AM

## 2024-01-11 NOTE — Telephone Encounter (Signed)
 LVM detailing NP response to urine culture sent a mychart message as well will attach message to this encounter   NP response below:  Please let pt know urine culture result (which was from the I&O cathed urine specimen) was positive for a Multi-Drug Resistant Organism (Providencia stuartii).    She can discontinue the Augmentin  which was prescribed as that will not treat this pathogen based on the resistance patterns seen on the culture sensitivity report. It was also resistant to Cipro, which is what was prescribed by her provider at [facility] on 01/02/2024.    Unfortunately the only oral antibiotic which showed sensitivity was Omnicef (Cefdinir) which we can't use because she is allergic to cephalosporins (Ceftriaxone  caused hives / rash). Therefore she will likely require IV antibiotics to address her current UTI and is advised to proceed to the ER for that.

## 2024-01-12 ENCOUNTER — Telehealth: Payer: Self-pay | Admitting: Nurse Practitioner

## 2024-01-12 NOTE — Telephone Encounter (Signed)
 The nurse at [facility] states that they did not take her to the ER. She did have the Lantus  50 units at bedtime as prescribed. Her BG this a.m. was 343 and she had 4 units. The nurse also states that the pt is non compliant with diet. Last night she had cheeseburger, fries, milkshake and snack which were brought by the husband. She states the reason her BG goes from low to high is because some days the pt will follow here diet perfectly and then other days she eats whatever she wants all day.

## 2024-01-12 NOTE — Telephone Encounter (Signed)
 Ok, thanks for the update.  She should have taken at least 10 units (meal coverage) plus the sliding scale.  Reinforce good food choices, otherwise she will likely lose her foot because the wound will not heal properly.

## 2024-01-12 NOTE — Telephone Encounter (Signed)
 Attempted to call back x 2. No answer. No VM. Faxed over instructions for insulin /diet which were given to pt and [facility] yesterday at visit. Confirmation received.

## 2024-01-16 DIAGNOSIS — E1165 Type 2 diabetes mellitus with hyperglycemia: Secondary | ICD-10-CM | POA: Diagnosis not present

## 2024-01-16 DIAGNOSIS — Z79899 Other long term (current) drug therapy: Secondary | ICD-10-CM | POA: Diagnosis not present

## 2024-01-17 NOTE — Progress Notes (Incomplete)
[facility] [address], [place] [address]    Clinic Day:  01/17/2024  Referring physician: [name], NP  Patient Care Team: [name], NP as PCP - General (Adult Health Nurse Practitioner) West Bali, MD (Inactive) as Consulting Physician (Gastroenterology) Doreatha Massed, MD as Medical Oncologist (Medical Oncology) Therese Sarah, RN as Oncology Nurse Navigator (Medical Oncology) Van Clines, MD as Consulting Physician (Neurology)   ASSESSMENT & PLAN:   Assessment: 1.  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  Rectal mass biopsy consistent with moderately differentiated adenocarcinoma. - CEA (04/06/2022): 25.7. - CT CAP (04/27/2022): No evidence of mass, lymphadenopathy or metastatic disease in the chest, abdomen or pelvis.  Clustered subsolid nodular opacities in the superior segment of the left lower lobe measures 0.6 cm. - Rectal MRI (05/10/2022): Lesion is in the sigmoid colon measuring 4 cm with findings for small volume extension into sigmoid mesocolon.  Intermittent association with adjacent uterus without gross invasion.  Sigmoid mesocolon node, borderline size.  Right external iliac nodes are favored to be reactive. - Low anterior resection (08/11/2022) by Dr. Maisie Fus - Pathology: Moderately differentiated adenocarcinoma, margins negative.  Site of tumor is rectosigmoid junction.  0/14 lymph nodes involved.  Negative LVI and PNI.  PT3 pN0.  MMR preserved.   2.  Social/family history: - She is a resident at [facility] nursing home for the past few months due to falls from right foot drop and numbness in the right hand, right foot and lower leg.  Previously she worked at the nursing home.  She is a non-smoker. - Maternal grandmother had colon cancer.    Plan: 1.  Stage II (T3 N0 M0)  rectosigmoid junction adenocarcinoma, MMR preserved: - Previous CT AP on 05/03/2023 showed mildly enlarged right common iliac lymph node 9 mm. - She denies any change in bowel habits.  No bleeding per rectum or melena. - Labs from 08/15/2023 reviewed by me showed normal LFTs.  CBC was grossly normal.  Creatinine elevated at 1.79.  CEA was normal at 1.8. - Recommend follow-up in 3 months with repeat CTAP with oral contrast only.  Will also repeat her CEA and LFTs.   2.  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 - Continue iron tablet daily.  Hemoglobin is 12.5.  Continue Colace.    No orders of the defined types were placed in this encounter.     I,Katie Daubenspeck,acting as a Neurosurgeon for Doreatha Massed, MD.,have documented all relevant documentation on the behalf of Doreatha Massed, MD,as directed by  Doreatha Massed, MD while in the presence of Doreatha Massed, MD.   ***  Katie Daubenspeck   2/11/20254:02 PM  CHIEF COMPLAINT:   Diagnosis: stage II rectosigmoid cancer   Cancer Staging  Cancer of sigmoid colon [facility]) Staging form: Colon and Rectum, AJCC 8th Edition - Clinical stage from 05/12/2022: Stage IIA (cT3, cN0, cM0) - Signed by Doreatha Massed, MD on 09/08/2022    Prior Therapy: Low anterior resection on 08/11/2022   Current Therapy:  Surveillance    HISTORY OF PRESENT ILLNESS:   Oncology History  Cancer of sigmoid colon (HCC)  05/12/2022 Initial Diagnosis   Cancer of sigmoid colon (HCC)   05/12/2022 Cancer Staging   Staging form: Colon and Rectum, AJCC 8th Edition - Clinical stage from 05/12/2022: Stage IIA (cT3, cN0, cM0) - Signed  by Doreatha Massed, MD on 09/08/2022 Histopathologic type: Adenocarcinoma, NOS Total positive nodes: 0 Histologic grade (G): G2 Histologic grading system: 4 grade system      INTERVAL HISTORY:   Oasis is a 63 y.o. female presenting to clinic today for follow up of stage II  rectosigmoid cancer. She was last seen by me on 08/22/23.  Since her last visit, she underwent surveillance CT A/P on 12/28/23 showing: increased hypodensity along falciform ligament, currently 3.9 cm, nonspecific and may reflect fatty infiltration/differential perfusion; stable 9 mm right common iliac lymph node; no new pathologically enlarged or enlarging lymph nodes; no new suspicious nodularity along suture line of prior sigmoidectomy.  Today, she states that she is doing well overall. Her appetite level is at ***%. Her energy level is at ***%.  PAST MEDICAL HISTORY:   Past Medical History: Past Medical History:  Diagnosis Date   Anemia    Arthritis    Asthma    Cancer of sigmoid (HCC)    COPD (chronic obstructive pulmonary disease) (HCC)    Depression    Diabetes mellitus    Diastolic dysfunction 05/15/2015   Dyspnea    Generalized weakness    Hyperlipidemia    Hypertension    Hypothyroidism    Obesity    Palpitations    Pneumonia    PONV (postoperative nausea and vomiting)    Prolonged QT interval 05/14/2015   Sleep apnea     Surgical History: Past Surgical History:  Procedure Laterality Date   BIOPSY  04/06/2022   Procedure: BIOPSY;  Surgeon: Dolores Frame, MD;  [facility];  Service: Gastroenterology;;   CATARACT EXTRACTION     CESAREAN SECTION     CHOLECYSTECTOMY     COLONOSCOPY WITH PROPOFOL N/A 04/06/2022   Procedure: COLONOSCOPY WITH PROPOFOL;  Surgeon: Dolores Frame, MD;  [facility]: [facility];  Service: Gastroenterology;  Laterality: N/A;  805 ASA 2 patient in [facility]   ESOPHAGOGASTRODUODENOSCOPY (EGD) WITH PROPOFOL N/A 04/06/2022   Procedure: ESOPHAGOGASTRODUODENOSCOPY (EGD) WITH PROPOFOL;  Surgeon: Dolores Frame, MD;  [facility];  Service: Gastroenterology;  Laterality: N/A;   HEMOSTASIS CLIP PLACEMENT  04/06/2022   Procedure: HEMOSTASIS CLIP PLACEMENT;  Surgeon: Dolores Frame, MD;  [facility];  Service: Gastroenterology;;   POLYPECTOMY  04/06/2022   Procedure: POLYPECTOMY;  Surgeon: Marguerita Merles, Reuel Boom, MD;  [facility];  Service: Gastroenterology;;   SUBMUCOSAL TATTOO INJECTION  04/06/2022   Procedure: SUBMUCOSAL TATTOO INJECTION;  Surgeon: Marguerita Merles, Reuel Boom, MD;  [facility];  Service: Gastroenterology;;    Social History: Social History   Socioeconomic History   Marital status: Married    Spouse name: Not on file   Number of children: Not on file   Years of education: Not on file   Highest education level: Not on file  Occupational History   Not on file  Tobacco Use   Smoking status: Never    Passive exposure: Yes   Smokeless tobacco: Never  Vaping Use   Vaping status: Never Used  Substance and Sexual Activity   Alcohol use: No    Alcohol/week: 0.0 standard drinks of alcohol   Drug use: No   Sexual activity: Yes    Birth control/protection: None  Other Topics Concern   Not on file  Social History Narrative   Lives with husband.  One living son.  Daughter died after transplant age 9.     Are  you right handed or left handed? Right   Are you currently employed ? 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    What type of home do you live in: 1 story or 2 story? one       Social Drivers of Corporate investment banker Strain: Medium Risk (08/14/2021)   Received from [facility], [facility]   Overall Financial Resource Strain (CARDIA)    Difficulty of Paying Living Expenses: Somewhat hard  Food Insecurity: No Food Insecurity (11/12/2023)   Hunger Vital Sign    Worried About Running Out of Food in the Last Year: Never true    Ran Out of Food in the Last Year: Never true  Transportation Needs: No Transportation Needs (11/12/2023)   PRAPARE - Administrator, Civil Service (Medical): No    Lack of Transportation (Non-Medical):  No  Physical Activity: Inactive (08/14/2021)   Received from [facility], [facility]   Exercise Vital Sign    Days of Exercise per Week: 0 days    Minutes of Exercise per Session: 0 min  Stress: Stress Concern Present (08/14/2021)   Received from [facility], [facility] Occupational Health - Occupational Stress Questionnaire    Feeling of Stress : Very much  Social Connections: Unknown (04/08/2022)   Received from [facility], [facility]   Social Network    Social Network: Not on file  Intimate Partner Violence: Not At Risk (11/12/2023)   Humiliation, Afraid, Rape, and Kick questionnaire    Fear of Current or Ex-Partner: No    Emotionally Abused: No    Physically Abused: No    Sexually Abused: No    Family History: Family History  Problem Relation Age of Onset   Stroke Mother    Diabetes Father    Heart failure Father    Hypertension Father    Diabetes Sister    Heart failure Sister    Hypertension Sister    Stroke Sister    Cancer Other    Stroke Sister     Current Medications:  Current Outpatient Medications:    acetaminophen (TYLENOL) 650 MG CR tablet, Take 650 mg by mouth every 8 (eight) hours as needed for pain., Disp: , Rfl:    albuterol (VENTOLIN HFA) 108 (90 Base) MCG/ACT inhaler, Inhale 2 puffs into the lungs every 6 (six) hours as needed for wheezing or shortness of breath., Disp: , Rfl:    alum & mag hydroxide-simeth (MAALOX/MYLANTA) 200-200-20 MG/5ML suspension, Take 30 mLs by mouth every 2 (two) hours as needed for indigestion or heartburn. Do not exceed 4 doses in 24 hours, Disp: , Rfl:    Amino Acids-Protein Hydrolys (FEEDING SUPPLEMENT, PRO-STAT 64,) LIQD, Take 30 mLs by mouth daily., Disp: , Rfl:    amLODipine (NORVASC) 5 MG tablet, Take 10 mg by mouth daily., Disp: , Rfl:    ascorbic acid (VITAMIN C) 500 MG tablet, Take 500 mg by mouth 2 (two) times daily., Disp: , Rfl:    aspirin EC 81 MG tablet, Take 81 mg by mouth  daily. Swallow whole., Disp: , Rfl:    atorvastatin (LIPITOR) 10 MG tablet, Take 10 mg by mouth at bedtime., Disp: , Rfl:    Biotin 10 MG TABS, Take 1 tablet by mouth daily., Disp: , Rfl:    cetirizine (ZYRTEC) 10 MG tablet, Take 10 mg by mouth daily., Disp: ,  Rfl:    Cholecalciferol (VITAMIN D) 50 MCG (2000 UT) CAPS, Take 2,000 Units by mouth daily., Disp: , Rfl:    Cranberry 600 MG TABS, Take 300 mg by mouth 2 (two) times daily. Half tab bid, Disp: , Rfl:    docusate sodium (COLACE) 100 MG capsule, Take 100 mg by mouth daily., Disp: , Rfl:    estradiol (ESTRACE) 0.1 MG/GM vaginal cream, Discard plastic applicator. Insert a blueberry size amount (approximately 1 gram) of cream on fingertip inside vagina at bedtime every night for 1 week then every other night routinely (for long term use)., Disp: 30 g, Rfl: 3   fluconazole (DIFLUCAN) 100 MG tablet, Take 1 tablet (100 mg total) by mouth daily. X 5 more days (Patient not taking: Reported on 01/11/2024), Disp: , Rfl:    fluticasone furoate-vilanterol (BREO ELLIPTA) 100-25 MCG/ACT AEPB, Inhale 1 puff into the lungs daily., Disp: 180 each, Rfl: 5   furosemide (LASIX) 20 MG tablet, Take 20 mg by mouth daily., Disp: , Rfl:    gabapentin (NEURONTIN) 300 MG capsule, Take 1 capsule in AM, 1 capsule in PM (Patient taking differently: Take 300 mg by mouth 2 (two) times daily.), Disp: 180 capsule, Rfl: 3   hydrALAZINE (APRESOLINE) 25 MG tablet, Take 1 tablet (25 mg total) by mouth every 8 (eight) hours., Disp: , Rfl:    ipratropium-albuterol (DUONEB) 0.5-2.5 (3) MG/3ML SOLN, Take 3 mLs by nebulization every 4 (four) hours as needed., Disp: , Rfl:    LANTUS 100 UNIT/ML injection, Inject 0.5 mLs (50 Units total) into the skin at bedtime., Disp: 45 mL, Rfl: 3   levothyroxine (SYNTHROID) 88 MCG tablet, Take 88 mcg by mouth daily before breakfast., Disp: , Rfl:    Menthol-Zinc Oxide (CALMOSEPTINE) 0.44-20.6 % OINT, Apply 1 Application topically as needed  (preservation/protection after incontinent care)., Disp: , Rfl:    Multiple Vitamins-Minerals (MULTIVITAMIN WITH MINERALS) tablet, Take 1 tablet by mouth daily., Disp: , Rfl:    MYRBETRIQ 50 MG TB24 tablet, Take 1 tablet (50 mg total) by mouth daily., Disp: 30 tablet, Rfl: 11   NOVOLOG 100 UNIT/ML injection, Inject 10-16 Units into the skin 3 (three) times daily with meals. 90-150= 10 units; 151-200= 11 units; 201-250= 12 units; 251-300= 13 units; 301-350= 14 units; 351-400= 15 units; above 400 = 16 units, Disp: 45 mL, Rfl: 3   nystatin cream (MYCOSTATIN), Apply 1 Application topically 2 (two) times daily., Disp: , Rfl:    omeprazole (PRILOSEC) 20 MG capsule, Take 20 mg by mouth daily., Disp: , Rfl:    ondansetron (ZOFRAN) 4 MG tablet, Take 8 mg by mouth 2 (two) times daily., Disp: , Rfl:    potassium chloride SA (K-DUR) 20 MEQ tablet, Take 20 mEq by mouth daily., Disp: , Rfl:    propranolol (INDERAL) 80 MG tablet, Take 80 mg by mouth daily., Disp: , Rfl:    saccharomyces boulardii (FLORASTOR) 250 MG capsule, Take 250 mg by mouth 2 (two) times daily., Disp: , Rfl:    sertraline (ZOLOFT) 100 MG tablet, Take 100 mg by mouth daily., Disp: , Rfl:    trimethoprim (TRIMPEX) 100 MG tablet, Take 1 tablet (100 mg total) by mouth daily., Disp: 30 tablet, Rfl: 11   Allergies: Allergies  Allergen Reactions   Carvedilol Palpitations   Benicar [Olmesartan] Swelling   Codeine Other (See Comments)    Confusion    Sulfa Antibiotics Swelling    Whole face swells   Trulicity [Dulaglutide] Diarrhea   Ceftriaxone Hives  and Rash    REVIEW OF SYSTEMS:   Review of Systems  Constitutional:  Negative for chills, fatigue and fever.  HENT:   Negative for lump/mass, mouth sores, nosebleeds, sore throat and trouble swallowing.   Eyes:  Negative for eye problems.  Respiratory:  Negative for cough and shortness of breath.   Cardiovascular:  Negative for chest pain, leg swelling and palpitations.  Gastrointestinal:   Negative for abdominal pain, constipation, diarrhea, nausea and vomiting.  Genitourinary:  Negative for bladder incontinence, difficulty urinating, dysuria, frequency, hematuria and nocturia.   Musculoskeletal:  Negative for arthralgias, back pain, flank pain, myalgias and neck pain.  Skin:  Negative for itching and rash.  Neurological:  Negative for dizziness, headaches and numbness.  Hematological:  Does not bruise/bleed easily.  Psychiatric/Behavioral:  Negative for depression, sleep disturbance and suicidal ideas. The patient is not nervous/anxious.   All other systems reviewed and are negative.    VITALS:   Last menstrual period 08/06/2012.  Wt Readings from Last 3 Encounters:  01/11/24 262 lb 12.8 oz (119.2 kg)  11/12/23 279 lb 8.7 oz (126.8 kg)  10/21/23 287 lb (130.2 kg)    There is no height or weight on file to calculate BMI.  Performance status (ECOG): {CHL ONC [id number]  PHYSICAL EXAM:   Physical Exam Vitals and nursing note reviewed. Exam conducted with a chaperone present.  Constitutional:      Appearance: Normal appearance.  Cardiovascular:     Rate and Rhythm: Normal rate and regular rhythm.     Pulses: Normal pulses.     Heart sounds: Normal heart sounds.  Pulmonary:     Effort: Pulmonary effort is normal.     Breath sounds: Normal breath sounds.  Abdominal:     Palpations: Abdomen is soft. There is no hepatomegaly, splenomegaly or mass.     Tenderness: There is no abdominal tenderness.  Musculoskeletal:     Right lower leg: No edema.     Left lower leg: No edema.  Lymphadenopathy:     Cervical: No cervical adenopathy.     Right cervical: No superficial, deep or posterior cervical adenopathy.    Left cervical: No superficial, deep or posterior cervical adenopathy.     Upper Body:     Right upper body: No supraclavicular or axillary adenopathy.     Left upper body: No supraclavicular or axillary adenopathy.  Neurological:     General: No focal  deficit present.     Mental Status: She is alert and oriented to person, place, and time.  Psychiatric:        Mood and Affect: Mood normal.        Behavior: Behavior normal.     LABS:   CBC     Component Value Date/Time   WBC 10.2 12/28/2023 1101   RBC 5.05 12/28/2023 1101   HGB 13.7 12/28/2023 1101   HCT 42.7 12/28/2023 1101   PLT 318 12/28/2023 1101   MCV 84.6 12/28/2023 1101   MCH 27.1 12/28/2023 1101   MCHC 32.1 12/28/2023 1101   RDW 15.4 12/28/2023 1101   LYMPHSABS 1.6 12/28/2023 1101   MONOABS 0.5 12/28/2023 1101   EOSABS 0.6 (H) 12/28/2023 1101   BASOSABS 0.1 12/28/2023 1101    CMP      Component Value Date/Time   NA 138 12/28/2023 1101   K 3.8 12/28/2023 1101   CL 97 (L) 12/28/2023 1101   CO2 32 12/28/2023 1101   GLUCOSE 268 (H) 12/28/2023 1101  BUN 27 (H) 12/28/2023 1101   CREATININE 1.64 (H) 12/28/2023 1101   CALCIUM 9.8 12/28/2023 1101   PROT 7.2 12/28/2023 1101   ALBUMIN 3.2 (L) 12/28/2023 1101   AST 19 12/28/2023 1101   ALT 14 12/28/2023 1101   ALKPHOS 102 12/28/2023 1101   BILITOT 0.6 12/28/2023 1101   GFRNONAA 35 (L) 12/28/2023 1101   GFRAA >60 12/26/2019 1530     Lab Results  Component Value Date   CEA1 3.3 12/28/2023   /  CEA  Date Value Ref Range Status  12/28/2023 3.3 0.0 - 4.7 ng/mL Final    Comment:    (NOTE)                             Nonsmokers          <3.9                             Smokers             <5.6 Roche [employer] Electrochemiluminescence Immunoassay (ECLIA) Values obtained with different assay methods or kits cannot be used interchangeably.  Results cannot be interpreted as absolute evidence of the presence or absence of malignant disease. Performed At: [facility] [address] [place], [place] [id number] Jolene Schimke MD WU:[id number]    No results found for: "PSA1" No results found for: "CAN199" No results found for: "CAN125"  No results found for: "TOTALPROTELP", "ALBUMINELP", "A1GS",  "A2GS", "BETS", "BETA2SER", "GAMS", "MSPIKE", "SPEI" Lab Results  Component Value Date   TIBC 225 (L) 05/04/2023   TIBC 257 01/25/2023   TIBC 257 10/13/2022   FERRITIN 47 05/04/2023   FERRITIN 44 01/25/2023   FERRITIN 18 10/13/2022   IRONPCTSAT 42 (H) 05/04/2023   IRONPCTSAT 27 01/25/2023   IRONPCTSAT 28 10/13/2022   No results found for: "LDH"   STUDIES:   CT ABDOMEN PELVIS W CONTRAST Result Date: 12/28/2023 CLINICAL DATA:  History of colon cancer, monitor. * Tracking Code: BO * EXAM: CT ABDOMEN AND PELVIS WITH CONTRAST TECHNIQUE: Multidetector CT imaging of the abdomen and pelvis was performed using the standard protocol following bolus administration of intravenous contrast. RADIATION DOSE REDUCTION: This exam was performed according to the departmental dose-optimization program which includes automated exposure control, adjustment of the mA and/or kV according to patient size and/or use of iterative reconstruction technique. CONTRAST:  30mL OMNIPAQUE IOHEXOL 300 MG/ML SOLN, 80mL OMNIPAQUE IOHEXOL 300 MG/ML SOLN COMPARISON:  Multiple priors including CT May 03, 2023 FINDINGS: Lower chest: Motion degraded examination of the lung bases. Hepatobiliary: Ill-defined heterogeneous hypodensity along the falciform ligament is once again increased from prior today measuring proximally 3.9 x 2.5 cm on image 26/2. Gallbladder surgically absent. No biliary ductal dilation. Pancreas: No pancreatic ductal dilation or evidence of acute inflammation. Spleen: No splenomegaly. Adrenals/Urinary Tract: Bilateral adrenal glands appear normal. No hydronephrosis. Stable benign right lower pole renal cysts. Urinary bladder is unremarkable for degree of distension. Stomach/Bowel: Radiopaque enteric contrast material traverses distal loops of small bowel. Small hiatal hernia. Pathologic dilation of small or large bowel. Evidence of acute bowel inflammation. Prior partial sigmoidectomy with reanastomosis. No new  suspicious nodularity along the suture line. Vascular/Lymphatic: Aortic atherosclerosis.  Smooth IVC contours. Stable mildly enlarged right common iliac lymph node measuring 9 mm in short axis on image 63/2. No new pathologically enlarged or enlarging lymph nodes identified in the abdomen or pelvis. Reproductive:  Uterus and bilateral adnexa are unremarkable. Other: No significant abdominopelvic free fluid. Musculoskeletal: No aggressive lytic or blastic lesion of bone IMPRESSION: 1. Ill-defined heterogeneous hypodensity along the falciform ligament is once again increased from prior today measuring proximally 3.9 x 2.5 cm. Findings are nonspecific and may reflect focal fatty infiltration/differential perfusion, however suggest more definitive assessment by liver protocol MRI with and without contrast. 2. Stable mildly enlarged right common iliac lymph node measuring 9 mm in short axis. No new pathologically enlarged or enlarging lymph nodes identified in the abdomen or pelvis. 3. Prior partial sigmoidectomy with reanastomosis. No new suspicious nodularity along the suture line. 4.  Aortic Atherosclerosis (ICD10-I70.0). Electronically Signed   By: Maudry Mayhew M.D.   On: 12/28/2023 14:09

## 2024-01-18 ENCOUNTER — Inpatient Hospital Stay: Payer: Medicare HMO | Admitting: Hematology

## 2024-01-19 DIAGNOSIS — G5603 Carpal tunnel syndrome, bilateral upper limbs: Secondary | ICD-10-CM | POA: Diagnosis not present

## 2024-01-22 NOTE — Progress Notes (Signed)
[facility] [address] [place] [address]    Clinic Day:  01/23/2024  Referring physician: [name], NP  Patient Care Team: [name], NP as PCP - General (Adult Health Nurse Practitioner) West Bali, MD (Inactive) as Consulting Physician (Gastroenterology) Doreatha Massed, MD as Medical Oncologist (Medical Oncology) Therese Sarah, RN as Oncology Nurse Navigator (Medical Oncology) Van Clines, MD as Consulting Physician (Neurology)   ASSESSMENT & PLAN:   Assessment: 1.  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  Rectal mass biopsy consistent with moderately differentiated adenocarcinoma. - CEA (04/06/2022): 25.7. - CT CAP (04/27/2022): No evidence of mass, lymphadenopathy or metastatic disease in the chest, abdomen or pelvis.  Clustered subsolid nodular opacities in the superior segment of the left lower lobe measures 0.6 cm. - Rectal MRI (05/10/2022): Lesion is in the sigmoid colon measuring 4 cm with findings for small volume extension into sigmoid mesocolon.  Intermittent association with adjacent uterus without gross invasion.  Sigmoid mesocolon node, borderline size.  Right external iliac nodes are favored to be reactive. - Low anterior resection (08/11/2022) by Dr. Maisie Fus - Pathology: Moderately differentiated adenocarcinoma, margins negative.  Site of tumor is rectosigmoid junction.  0/14 lymph nodes involved.  Negative LVI and PNI.  PT3 pN0.  MMR preserved.   2.  Social/family history: - She is a resident at [facility] nursing home for the past few months due to falls from right foot drop and numbness in the right hand, right foot and lower leg.  Previously she worked at the nursing home.  She is a non-smoker. - Maternal grandmother had colon cancer.    Plan: 1.  Stage II (T3 N0 M0)  rectosigmoid junction adenocarcinoma, MMR preserved: - CTAP (12/27/2022): Ill-defined heterogeneous hypodensity along the falciform ligament is once again seen, increased from prior measuring 3.9 x 2.5 cm.  Findings are nonspecific and may reflect focal fatty infiltration/differential perfusion.  Stable mildly enlarged right common iliac lymph node measuring 9 mm in short axis.  No new lymphadenopathy. - Denies any bleeding per rectum or melena.  She is at [facility] rehab facility for diabetic right foot ulcer. - Labs from 12/28/2023: CEA 3.3.  LFTs normal. - Based on the above findings, I have recommended MRI of the liver with and without contrast.  RTC after scan.   2.  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 - She is taking multivitamin tablet daily.  Hemoglobin is normal at 13.7.    Orders Placed This Encounter  Procedures   MR LIVER W WO CONTRAST    Standing Status:   Future    Expected Date:   01/30/2024    Expiration Date:   01/22/2025    If indicated for the ordered procedure, I authorize the administration of contrast media per Radiology protocol:   Yes    What is the patient's sedation requirement?:   No Sedation    Does the patient have a pacemaker or implanted devices?:   No    Release to patient:   Immediate    Preferred imaging location?:   [facility] (table limit - 500 lbs)      I,Katie Daubenspeck,acting as a scribe for Doreatha Massed, MD.,have documented all relevant documentation on the behalf of Doreatha Massed, MD,as directed by  Doreatha Massed, MD while in the presence of Doreatha Massed,  MD.   Margaret Pyle MD, have reviewed the above documentation for accuracy and completeness, and I agree with the above.   Doreatha Massed, MD   2/17/202510:23 AM  CHIEF COMPLAINT:   Diagnosis: stage II rectosigmoid cancer    Cancer Staging  Cancer of sigmoid colon [facility]) Staging form: Colon  and Rectum, AJCC 8th Edition - Clinical stage from 05/12/2022: Stage IIA (cT3, cN0, cM0) - Signed by Doreatha Massed, MD on 09/08/2022    Prior Therapy: Low anterior resection on 08/11/2022   Current Therapy:  Surveillance    HISTORY OF PRESENT ILLNESS:   Oncology History  Cancer of sigmoid colon (HCC)  05/12/2022 Initial Diagnosis   Cancer of sigmoid colon (HCC)   05/12/2022 Cancer Staging   Staging form: Colon and Rectum, AJCC 8th Edition - Clinical stage from 05/12/2022: Stage IIA (cT3, cN0, cM0) - Signed by Doreatha Massed, MD on 09/08/2022 Histopathologic type: Adenocarcinoma, NOS Total positive nodes: 0 Histologic grade (G): G2 Histologic grading system: 4 grade system      INTERVAL HISTORY:   [name] is a 63 y.o. female presenting to clinic today for follow up of stage II rectosigmoid cancer. She was last seen by me on 08/22/23.  Since her last visit, she underwent surveillance CT A/P on 12/28/23 showing: increased hypodensity along falciform ligament, currently 3.9 cm, nonspecific and may reflect fatty infiltration/differential perfusion; stable 9 mm right common iliac lymph node; no new pathologically enlarged or enlarging lymph nodes; no new suspicious nodularity along suture line of prior sigmoidectomy.  Today, she states that she is doing well overall. Her appetite level is at 100%. Her energy level is at 50%.  PAST MEDICAL HISTORY:   Past Medical History: Past Medical History:  Diagnosis Date   Anemia    Arthritis    Asthma    Cancer of sigmoid (HCC)    COPD (chronic obstructive pulmonary disease) (HCC)    Depression    Diabetes mellitus    Diastolic dysfunction 05/15/2015   Dyspnea    Generalized weakness    Hyperlipidemia    Hypertension    Hypothyroidism    Obesity    Palpitations    Pneumonia    PONV (postoperative nausea and vomiting)    Prolonged QT interval 05/14/2015   Sleep apnea     Surgical History: Past Surgical History:  Procedure  Laterality Date   BIOPSY  04/06/2022   Procedure: BIOPSY;  Surgeon: Dolores Frame, MD;  [facility];  Service: Gastroenterology;;   CATARACT EXTRACTION     CESAREAN SECTION     CHOLECYSTECTOMY     COLONOSCOPY WITH PROPOFOL N/A 04/06/2022   Procedure: COLONOSCOPY WITH PROPOFOL;  Surgeon: Dolores Frame, MD;  [facility]: [facility];  Service: Gastroenterology;  Laterality: N/A;  805 ASA 2 patient in [facility]   ESOPHAGOGASTRODUODENOSCOPY (EGD) WITH PROPOFOL N/A 04/06/2022   Procedure: ESOPHAGOGASTRODUODENOSCOPY (EGD) WITH PROPOFOL;  Surgeon: Dolores Frame, MD;  [facility];  Service: Gastroenterology;  Laterality: N/A;   HEMOSTASIS CLIP PLACEMENT  04/06/2022   Procedure: HEMOSTASIS CLIP PLACEMENT;  Surgeon: Dolores Frame, MD;  [facility];  Service: Gastroenterology;;   POLYPECTOMY  04/06/2022   Procedure: POLYPECTOMY;  Surgeon: Dolores Frame, MD;  [facility];  Service: Gastroenterology;;   SUBMUCOSAL TATTOO INJECTION  04/06/2022   Procedure: SUBMUCOSAL TATTOO INJECTION;  Surgeon: Dolores Frame, MD;  [facility];  Service: Gastroenterology;;    Social  History: Social History   Socioeconomic History   Marital status: Married    Spouse name: Not on file   Number of children: Not on file   Years of education: Not on file   Highest education level: Not on file  Occupational History   Not on file  Tobacco Use   Smoking status: Never    Passive exposure: Yes   Smokeless tobacco: Never  Vaping Use   Vaping status: Never Used  Substance and Sexual Activity   Alcohol use: No    Alcohol/week: 0.0 standard drinks of alcohol   Drug use: No   Sexual activity: Yes    Birth control/protection: None  Other Topics Concern   Not on file  Social History Narrative   Lives with husband.  One living son.  Daughter died after transplant age 46.     Are you  right handed or left handed? Right   Are you currently employed ? 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    What type of home do you live in: 1 story or 2 story? one       Social Drivers of Corporate investment banker Strain: Medium Risk (08/14/2021)   Received from [facility]   Overall Financial Resource Strain (CARDIA)    Difficulty of Paying Living Expenses: Somewhat hard  Food Insecurity: No Food Insecurity (11/12/2023)   Hunger Vital Sign    Worried About Running Out of Food in the Last Year: Never true    Ran Out of Food in the Last Year: Never true  Transportation Needs: No Transportation Needs (11/12/2023)   PRAPARE - Administrator, Civil Service (Medical): No    Lack of Transportation (Non-Medical): No  Physical Activity: Inactive (08/14/2021)   Received from [facility]   Exercise Vital Sign    Days of Exercise per Week: 0 days    Minutes of Exercise per Session: 0 min  Stress: Stress Concern Present (08/14/2021)   Received from [facility] - Occupational Stress Questionnaire    Feeling of Stress : Very much  Social Connections: Unknown (04/08/2022)   Received from [facility]   Social Network    Social Network: Not on file  Intimate Partner Violence: Not At Risk (11/12/2023)   Humiliation, Afraid, Rape, and Kick questionnaire    Fear of Current or Ex-Partner: No    Emotionally Abused: No    Physically Abused: No    Sexually Abused: No    Family History: Family History  Problem Relation Age of Onset   Stroke Mother    Diabetes Father    Heart failure Father    Hypertension Father    Diabetes Sister    Heart failure Sister    Hypertension Sister    Stroke Sister    Cancer Other    Stroke Sister     Current Medications:  Current Outpatient Medications:    albuterol (VENTOLIN HFA) 108  (90 Base) MCG/ACT inhaler, Inhale 2 puffs into the lungs every 6 (six) hours as needed for wheezing or shortness of breath., Disp: , Rfl:    alum & mag hydroxide-simeth (MAALOX/MYLANTA) 200-200-20 MG/5ML suspension, Take 30 mLs by mouth every 2 (two) hours as needed for indigestion or heartburn. Do not exceed 4 doses in 24 hours, Disp: , Rfl:    Amino  Acids-Protein Hydrolys (FEEDING SUPPLEMENT, PRO-STAT 64,) LIQD, Take 30 mLs by mouth daily., Disp: , Rfl:    amLODipine (NORVASC) 5 MG tablet, Take 10 mg by mouth daily., Disp: , Rfl:    ascorbic acid (VITAMIN C) 500 MG tablet, Take 500 mg by mouth 2 (two) times daily., Disp: , Rfl:    aspirin EC 81 MG tablet, Take 81 mg by mouth daily. Swallow whole., Disp: , Rfl:    atorvastatin (LIPITOR) 10 MG tablet, Take 10 mg by mouth at bedtime., Disp: , Rfl:    augmented betamethasone dipropionate (DIPROLENE-AF) 0.05 % cream, Apply topically 2 (two) times daily., Disp: , Rfl:    Biotin 10 MG TABS, Take 1 tablet by mouth daily., Disp: , Rfl:    cetirizine (ZYRTEC) 10 MG tablet, Take 10 mg by mouth daily., Disp: , Rfl:    Cholecalciferol (VITAMIN D) 50 MCG (2000 UT) CAPS, Take 2,000 Units by mouth daily., Disp: , Rfl:    Cranberry 600 MG TABS, Take 300 mg by mouth 2 (two) times daily. Half tab bid, Disp: , Rfl:    docusate sodium (COLACE) 100 MG capsule, Take 100 mg by mouth daily., Disp: , Rfl:    estradiol (ESTRACE) 0.1 MG/GM vaginal cream, Discard plastic applicator. Insert a blueberry size amount (approximately 1 gram) of cream on fingertip inside vagina at bedtime every night for 1 week then every other night routinely (for long term use)., Disp: 30 g, Rfl: 3   furosemide (LASIX) 20 MG tablet, Take 20 mg by mouth daily., Disp: , Rfl:    gabapentin (NEURONTIN) 300 MG capsule, Take 1 capsule in AM, 1 capsule in PM (Patient taking differently: Take 300 mg by mouth 2 (two) times daily.), Disp: 180 capsule, Rfl: 3   hydrALAZINE (APRESOLINE) 25 MG tablet, Take 1  tablet (25 mg total) by mouth every 8 (eight) hours., Disp: , Rfl:    LANTUS 100 UNIT/ML injection, Inject 0.5 mLs (50 Units total) into the skin at bedtime., Disp: 45 mL, Rfl: 3   levothyroxine (SYNTHROID) 88 MCG tablet, Take 88 mcg by mouth daily before breakfast., Disp: , Rfl:    Menthol-Zinc Oxide (CALMOSEPTINE) 0.44-20.6 % OINT, Apply 1 Application topically as needed (preservation/protection after incontinent care)., Disp: , Rfl:    Multiple Vitamins-Minerals (MULTIVITAMIN WITH MINERALS) tablet, Take 1 tablet by mouth daily., Disp: , Rfl:    MYRBETRIQ 50 MG TB24 tablet, Take 1 tablet (50 mg total) by mouth daily., Disp: 30 tablet, Rfl: 11   NOVOLOG 100 UNIT/ML injection, Inject 10-16 Units into the skin 3 (three) times daily with meals. 90-150= 10 units; 151-200= 11 units; 201-250= 12 units; 251-300= 13 units; 301-350= 14 units; 351-400= 15 units; above 400 = 16 units, Disp: 45 mL, Rfl: 3   omeprazole (PRILOSEC) 20 MG capsule, Take 20 mg by mouth daily., Disp: , Rfl:    ondansetron (ZOFRAN) 4 MG tablet, Take 8 mg by mouth 2 (two) times daily., Disp: , Rfl:    potassium chloride SA (K-DUR) 20 MEQ tablet, Take 20 mEq by mouth daily., Disp: , Rfl:    saccharomyces boulardii (FLORASTOR) 250 MG capsule, Take 250 mg by mouth 2 (two) times daily., Disp: , Rfl:    sertraline (ZOLOFT) 100 MG tablet, Take 100 mg by mouth daily., Disp: , Rfl:    trimethoprim (TRIMPEX) 100 MG tablet, Take 1 tablet (100 mg total) by mouth daily., Disp: 30 tablet, Rfl: 11   acetaminophen (TYLENOL) 650 MG CR tablet, Take 650 mg by  mouth every 8 (eight) hours as needed for pain. (Patient not taking: Reported on 01/23/2024), Disp: , Rfl:    fluconazole (DIFLUCAN) 100 MG tablet, Take 1 tablet (100 mg total) by mouth daily. X 5 more days (Patient not taking: Reported on 01/23/2024), Disp: , Rfl:    fluticasone furoate-vilanterol (BREO ELLIPTA) 100-25 MCG/ACT AEPB, Inhale 1 puff into the lungs daily. (Patient not taking: Reported on  01/23/2024), Disp: 180 each, Rfl: 5   ipratropium-albuterol (DUONEB) 0.5-2.5 (3) MG/3ML SOLN, Take 3 mLs by nebulization every 4 (four) hours as needed. (Patient not taking: Reported on 01/23/2024), Disp: , Rfl:    nystatin cream (MYCOSTATIN), Apply 1 Application topically 2 (two) times daily. (Patient not taking: Reported on 01/23/2024), Disp: , Rfl:    propranolol (INDERAL) 80 MG tablet, Take 80 mg by mouth daily. (Patient not taking: Reported on 01/23/2024), Disp: , Rfl:    Allergies: Allergies  Allergen Reactions   Carvedilol Palpitations   Benicar [Olmesartan] Swelling   Codeine Other (See Comments)    Confusion    Sulfa Antibiotics Swelling    Whole face swells   Trulicity [Dulaglutide] Diarrhea   Ceftriaxone Hives and Rash    REVIEW OF SYSTEMS:   Review of Systems  Constitutional:  Negative for chills, fatigue and fever.  HENT:   Negative for lump/mass, mouth sores, nosebleeds, sore throat and trouble swallowing.   Eyes:  Negative for eye problems.  Respiratory:  Negative for cough and shortness of breath.   Cardiovascular:  Negative for chest pain, leg swelling and palpitations.  Gastrointestinal:  Positive for constipation. Negative for abdominal pain, diarrhea, nausea and vomiting.  Genitourinary:  Positive for dysuria. Negative for bladder incontinence, difficulty urinating, frequency, hematuria and nocturia.   Musculoskeletal:  Negative for arthralgias, back pain, flank pain, myalgias and neck pain.  Skin:  Negative for itching and rash.  Neurological:  Positive for numbness. Negative for dizziness and headaches.  Hematological:  Does not bruise/bleed easily.  Psychiatric/Behavioral:  Negative for depression, sleep disturbance and suicidal ideas. The patient is not nervous/anxious.   All other systems reviewed and are negative.    VITALS:   Blood pressure 128/61, pulse 65, temperature (!) 97.5 F (36.4 C), temperature source Oral, resp. rate 16, weight 263 lb 14.3 oz  (119.7 kg), last menstrual period 08/06/2012, SpO2 94%.  Wt Readings from Last 3 Encounters:  01/23/24 263 lb 14.3 oz (119.7 kg)  01/11/24 262 lb 12.8 oz (119.2 kg)  11/12/23 279 lb 8.7 oz (126.8 kg)    Body mass index is 49.86 kg/m.  Performance status (ECOG): 2 - Symptomatic, <50% confined to bed  PHYSICAL EXAM:   Physical Exam Vitals and nursing note reviewed. Exam conducted with a chaperone present.  Constitutional:      Appearance: Normal appearance.  Cardiovascular:     Rate and Rhythm: Normal rate and regular rhythm.     Pulses: Normal pulses.     Heart sounds: Normal heart sounds.  Pulmonary:     Effort: Pulmonary effort is normal.     Breath sounds: Normal breath sounds.  Abdominal:     Palpations: Abdomen is soft. There is no hepatomegaly, splenomegaly or mass.     Tenderness: There is no abdominal tenderness.  Musculoskeletal:     Right lower leg: No edema.     Left lower leg: No edema.  Lymphadenopathy:     Cervical: No cervical adenopathy.     Right cervical: No superficial, deep or posterior cervical adenopathy.  Left cervical: No superficial, deep or posterior cervical adenopathy.     Upper Body:     Right upper body: No supraclavicular or axillary adenopathy.     Left upper body: No supraclavicular or axillary adenopathy.  Neurological:     General: No focal deficit present.     Mental Status: She is alert and oriented to person, place, and time.  Psychiatric:        Mood and Affect: Mood normal.        Behavior: Behavior normal.     LABS:   CBC     Component Value Date/Time   WBC 10.2 12/28/2023 1101   RBC 5.05 12/28/2023 1101   HGB 13.7 12/28/2023 1101   HCT 42.7 12/28/2023 1101   PLT 318 12/28/2023 1101   MCV 84.6 12/28/2023 1101   MCH 27.1 12/28/2023 1101   MCHC 32.1 12/28/2023 1101   RDW 15.4 12/28/2023 1101   LYMPHSABS 1.6 12/28/2023 1101   MONOABS 0.5 12/28/2023 1101   EOSABS 0.6 (H) 12/28/2023 1101   BASOSABS 0.1 12/28/2023  1101    CMP      Component Value Date/Time   NA 138 12/28/2023 1101   K 3.8 12/28/2023 1101   CL 97 (L) 12/28/2023 1101   CO2 32 12/28/2023 1101   GLUCOSE 268 (H) 12/28/2023 1101   BUN 27 (H) 12/28/2023 1101   CREATININE 1.64 (H) 12/28/2023 1101   CALCIUM 9.8 12/28/2023 1101   PROT 7.2 12/28/2023 1101   ALBUMIN 3.2 (L) 12/28/2023 1101   AST 19 12/28/2023 1101   ALT 14 12/28/2023 1101   ALKPHOS 102 12/28/2023 1101   BILITOT 0.6 12/28/2023 1101   GFRNONAA 35 (L) 12/28/2023 1101   GFRAA >60 12/26/2019 1530     Lab Results  Component Value Date   CEA1 3.3 12/28/2023   /  CEA  Date Value Ref Range Status  12/28/2023 3.3 0.0 - 4.7 ng/mL Final    Comment:    (NOTE)                             Nonsmokers          <3.9                             Smokers             <5.6 [employer] Electrochemiluminescence Immunoassay (ECLIA) Values obtained with different assay methods or kits cannot be used interchangeably.  Results cannot be interpreted as absolute evidence of the presence or absence of malignant disease. Performed At: [facility] [address] [place], [place] [id number] Jolene Schimke MD QQ:[id number]    No results found for: "PSA1" No results found for: "CAN199" No results found for: "CAN125"  No results found for: "TOTALPROTELP", "ALBUMINELP", "A1GS", "A2GS", "BETS", "BETA2SER", "GAMS", "MSPIKE", "SPEI" Lab Results  Component Value Date   TIBC 225 (L) 05/04/2023   TIBC 257 01/25/2023   TIBC 257 10/13/2022   FERRITIN 47 05/04/2023   FERRITIN 44 01/25/2023   FERRITIN 18 10/13/2022   IRONPCTSAT 42 (H) 05/04/2023   IRONPCTSAT 27 01/25/2023   IRONPCTSAT 28 10/13/2022   No results found for: "LDH"   STUDIES:   CT ABDOMEN PELVIS W CONTRAST Result Date: 12/28/2023 CLINICAL DATA:  History of colon cancer, monitor. * Tracking Code: BO * EXAM: CT ABDOMEN AND PELVIS WITH CONTRAST TECHNIQUE: Multidetector CT imaging of  the abdomen and pelvis was  performed using the standard protocol following bolus administration of intravenous contrast. RADIATION DOSE REDUCTION: This exam was performed according to the departmental dose-optimization program which includes automated exposure control, adjustment of the mA and/or kV according to patient size and/or use of iterative reconstruction technique. CONTRAST:  30mL OMNIPAQUE IOHEXOL 300 MG/ML SOLN, 80mL OMNIPAQUE IOHEXOL 300 MG/ML SOLN COMPARISON:  Multiple priors including CT May 03, 2023 FINDINGS: Lower chest: Motion degraded examination of the lung bases. Hepatobiliary: Ill-defined heterogeneous hypodensity along the falciform ligament is once again increased from prior today measuring proximally 3.9 x 2.5 cm on image 26/2. Gallbladder surgically absent. No biliary ductal dilation. Pancreas: No pancreatic ductal dilation or evidence of acute inflammation. Spleen: No splenomegaly. Adrenals/Urinary Tract: Bilateral adrenal glands appear normal. No hydronephrosis. Stable benign right lower pole renal cysts. Urinary bladder is unremarkable for degree of distension. Stomach/Bowel: Radiopaque enteric contrast material traverses distal loops of small bowel. Small hiatal hernia. Pathologic dilation of small or large bowel. Evidence of acute bowel inflammation. Prior partial sigmoidectomy with reanastomosis. No new suspicious nodularity along the suture line. Vascular/Lymphatic: Aortic atherosclerosis.  Smooth IVC contours. Stable mildly enlarged right common iliac lymph node measuring 9 mm in short axis on image 63/2. No new pathologically enlarged or enlarging lymph nodes identified in the abdomen or pelvis. Reproductive: Uterus and bilateral adnexa are unremarkable. Other: No significant abdominopelvic free fluid. Musculoskeletal: No aggressive lytic or blastic lesion of bone IMPRESSION: 1. Ill-defined heterogeneous hypodensity along the falciform ligament is once again increased from prior today measuring proximally 3.9  x 2.5 cm. Findings are nonspecific and may reflect focal fatty infiltration/differential perfusion, however suggest more definitive assessment by liver protocol MRI with and without contrast. 2. Stable mildly enlarged right common iliac lymph node measuring 9 mm in short axis. No new pathologically enlarged or enlarging lymph nodes identified in the abdomen or pelvis. 3. Prior partial sigmoidectomy with reanastomosis. No new suspicious nodularity along the suture line. 4.  Aortic Atherosclerosis (ICD10-I70.0). Electronically Signed   By: Maudry Mayhew M.D.   On: 12/28/2023 14:09

## 2024-01-23 ENCOUNTER — Inpatient Hospital Stay: Payer: Medicare HMO | Attending: Hematology | Admitting: Hematology

## 2024-01-23 VITALS — BP 128/61 | HR 65 | Temp 97.5°F | Resp 16 | Wt 263.9 lb

## 2024-01-23 DIAGNOSIS — K769 Liver disease, unspecified: Secondary | ICD-10-CM | POA: Diagnosis not present

## 2024-01-23 DIAGNOSIS — Z7982 Long term (current) use of aspirin: Secondary | ICD-10-CM | POA: Insufficient documentation

## 2024-01-23 DIAGNOSIS — E1122 Type 2 diabetes mellitus with diabetic chronic kidney disease: Secondary | ICD-10-CM | POA: Diagnosis not present

## 2024-01-23 DIAGNOSIS — Z85048 Personal history of other malignant neoplasm of rectum, rectosigmoid junction, and anus: Secondary | ICD-10-CM | POA: Insufficient documentation

## 2024-01-23 DIAGNOSIS — D631 Anemia in chronic kidney disease: Secondary | ICD-10-CM | POA: Insufficient documentation

## 2024-01-23 DIAGNOSIS — D509 Iron deficiency anemia, unspecified: Secondary | ICD-10-CM | POA: Diagnosis not present

## 2024-01-23 DIAGNOSIS — Z79899 Other long term (current) drug therapy: Secondary | ICD-10-CM | POA: Diagnosis not present

## 2024-01-23 DIAGNOSIS — I129 Hypertensive chronic kidney disease with stage 1 through stage 4 chronic kidney disease, or unspecified chronic kidney disease: Secondary | ICD-10-CM | POA: Diagnosis not present

## 2024-01-23 DIAGNOSIS — N189 Chronic kidney disease, unspecified: Secondary | ICD-10-CM | POA: Insufficient documentation

## 2024-01-23 NOTE — Patient Instructions (Signed)
[facility] - [facility]  Discharge Instructions  You were seen and examined today by Dr. Ellin Saba.  Dr. Ellin Saba discussed your most recent lab work which was stable, including your CEA, and CT scan which revealed a small lesion on your liver. Dr. Ellin Saba has recommended an MRI of your liver to ensure this is benign.  Follow-up as scheduled.  Thank you for choosing  [facility] to provide your oncology and hematology care.   To afford each patient quality time with our provider, please arrive at least 15 minutes before your scheduled appointment time. You may need to reschedule your appointment if you arrive late (10 or more minutes). Arriving late affects you and other patients whose appointments are after yours.  Also, if you miss three or more appointments without notifying the office, you may be dismissed from the clinic at the provider's discretion.    Again, thank you for choosing [facility].  Our hope is that these requests will decrease the amount of time that you wait before being seen by our physicians.   If you have a lab appointment with the Cancer Center - please note that after April 8th, all labs will be drawn in the cancer center.  You do not have to check in or register with the main entrance as you have in the past but will complete your check-in at the cancer center.            _____________________________________________________________  Should you have questions after your visit to [facility], please contact our office at [phone] and follow the prompts.  Our office hours are 8:00 a.m. to 4:30 p.m. Monday - Thursday and 8:00 a.m. to 2:30 p.m. Friday.  Please note that voicemails left after 4:00 p.m. may not be returned until the following business day.  We are closed weekends and all major holidays.  You do have access to a nurse 24-7, just call the main number to the clinic [phone] and do  not press any options, hold on the line and a nurse will answer the phone.    For prescription refill requests, have your pharmacy contact our office and allow 72 hours.    Masks are no longer required in the cancer centers. If you would like for your care team to wear a mask while they are taking care of you, please let them know. You may have one support person who is at least 63 years old accompany you for your appointments.

## 2024-01-24 DIAGNOSIS — Z79899 Other long term (current) drug therapy: Secondary | ICD-10-CM | POA: Diagnosis not present

## 2024-01-24 DIAGNOSIS — L400 Psoriasis vulgaris: Secondary | ICD-10-CM | POA: Diagnosis not present

## 2024-01-24 DIAGNOSIS — Z9189 Other specified personal risk factors, not elsewhere classified: Secondary | ICD-10-CM | POA: Diagnosis not present

## 2024-01-24 DIAGNOSIS — L404 Guttate psoriasis: Secondary | ICD-10-CM | POA: Diagnosis not present

## 2024-01-26 DIAGNOSIS — N939 Abnormal uterine and vaginal bleeding, unspecified: Secondary | ICD-10-CM | POA: Diagnosis not present

## 2024-01-26 DIAGNOSIS — N390 Urinary tract infection, site not specified: Secondary | ICD-10-CM | POA: Diagnosis not present

## 2024-01-30 ENCOUNTER — Encounter (HOSPITAL_BASED_OUTPATIENT_CLINIC_OR_DEPARTMENT_OTHER): Payer: Medicare HMO | Admitting: General Surgery

## 2024-01-30 DIAGNOSIS — Z6841 Body Mass Index (BMI) 40.0 and over, adult: Secondary | ICD-10-CM | POA: Diagnosis not present

## 2024-01-30 DIAGNOSIS — N939 Abnormal uterine and vaginal bleeding, unspecified: Secondary | ICD-10-CM | POA: Diagnosis not present

## 2024-01-30 DIAGNOSIS — N390 Urinary tract infection, site not specified: Secondary | ICD-10-CM | POA: Diagnosis not present

## 2024-01-30 DIAGNOSIS — Z79899 Other long term (current) drug therapy: Secondary | ICD-10-CM | POA: Diagnosis not present

## 2024-01-30 DIAGNOSIS — E1165 Type 2 diabetes mellitus with hyperglycemia: Secondary | ICD-10-CM | POA: Diagnosis not present

## 2024-01-30 DIAGNOSIS — E11621 Type 2 diabetes mellitus with foot ulcer: Secondary | ICD-10-CM | POA: Diagnosis not present

## 2024-01-30 DIAGNOSIS — J449 Chronic obstructive pulmonary disease, unspecified: Secondary | ICD-10-CM | POA: Diagnosis not present

## 2024-01-30 DIAGNOSIS — I5032 Chronic diastolic (congestive) heart failure: Secondary | ICD-10-CM | POA: Diagnosis not present

## 2024-01-30 DIAGNOSIS — L97412 Non-pressure chronic ulcer of right heel and midfoot with fat layer exposed: Secondary | ICD-10-CM | POA: Diagnosis not present

## 2024-01-31 ENCOUNTER — Ambulatory Visit (HOSPITAL_COMMUNITY)
Admission: RE | Admit: 2024-01-31 | Discharge: 2024-01-31 | Disposition: A | Discharge status: Home / Self Care | Payer: Medicare HMO | Source: Ambulatory Visit | Attending: Hematology | Admitting: Hematology

## 2024-01-31 DIAGNOSIS — K769 Liver disease, unspecified: Secondary | ICD-10-CM | POA: Diagnosis not present

## 2024-01-31 MED ORDER — GADOBUTROL 1 MMOL/ML IV SOLN
10.0000 mL | Freq: Once | INTRAVENOUS | Status: AC | PRN
  Administered 2024-01-31: 10 mL via INTRAVENOUS

## 2024-02-06 DIAGNOSIS — N390 Urinary tract infection, site not specified: Secondary | ICD-10-CM | POA: Diagnosis not present

## 2024-02-06 DIAGNOSIS — N1832 Chronic kidney disease, stage 3b: Secondary | ICD-10-CM | POA: Diagnosis not present

## 2024-02-06 DIAGNOSIS — D649 Anemia, unspecified: Secondary | ICD-10-CM | POA: Diagnosis not present

## 2024-02-06 DIAGNOSIS — E1140 Type 2 diabetes mellitus with diabetic neuropathy, unspecified: Secondary | ICD-10-CM | POA: Diagnosis not present

## 2024-02-07 ENCOUNTER — Inpatient Hospital Stay: Payer: Medicare HMO | Attending: Hematology | Admitting: Hematology

## 2024-02-07 VITALS — BP 146/63 | HR 71 | Temp 98.8°F | Resp 18 | Ht 61.0 in | Wt 263.0 lb

## 2024-02-07 DIAGNOSIS — N189 Chronic kidney disease, unspecified: Secondary | ICD-10-CM | POA: Insufficient documentation

## 2024-02-07 DIAGNOSIS — D509 Iron deficiency anemia, unspecified: Secondary | ICD-10-CM | POA: Insufficient documentation

## 2024-02-07 DIAGNOSIS — C187 Malignant neoplasm of sigmoid colon: Secondary | ICD-10-CM

## 2024-02-07 DIAGNOSIS — Z85048 Personal history of other malignant neoplasm of rectum, rectosigmoid junction, and anus: Secondary | ICD-10-CM | POA: Insufficient documentation

## 2024-02-07 DIAGNOSIS — D631 Anemia in chronic kidney disease: Secondary | ICD-10-CM | POA: Diagnosis not present

## 2024-02-07 DIAGNOSIS — Z7982 Long term (current) use of aspirin: Secondary | ICD-10-CM | POA: Insufficient documentation

## 2024-02-07 DIAGNOSIS — E1122 Type 2 diabetes mellitus with diabetic chronic kidney disease: Secondary | ICD-10-CM | POA: Diagnosis not present

## 2024-02-07 DIAGNOSIS — Z79899 Other long term (current) drug therapy: Secondary | ICD-10-CM | POA: Insufficient documentation

## 2024-02-07 DIAGNOSIS — Z9049 Acquired absence of other specified parts of digestive tract: Secondary | ICD-10-CM | POA: Insufficient documentation

## 2024-02-07 NOTE — Patient Instructions (Addendum)
 [facility] at [facility] Discharge Instructions   You were seen and examined today by Dr. Ellin Saba.  He reviewed the results of your MRI which did not show any evidence of cancer in the liver.   We will see you back in 3 months. We will repeat lab work prior to this visit.   Return as scheduled.    Thank you for choosing [facility] at [facility] to provide your oncology and hematology care.  To afford each patient quality time with our provider, please arrive at least 15 minutes before your scheduled appointment time.   If you have a lab appointment with the Cancer Center please come in thru the Main Entrance and check in at the main information desk.  You need to re-schedule your appointment should you arrive 10 or more minutes late.  We strive to give you quality time with our providers, and arriving late affects you and other patients whose appointments are after yours.  Also, if you no show three or more times for appointments you may be dismissed from the clinic at the providers discretion.     Again, thank you for choosing [facility].  Our hope is that these requests will decrease the amount of time that you wait before being seen by our physicians.       _____________________________________________________________  Should you have questions after your visit to [facility], please contact our office at [phone] and follow the prompts.  Our office hours are 8:00 a.m. and 4:30 p.m. Monday - Friday.  Please note that voicemails left after 4:00 p.m. may not be returned until the following business day.  We are closed weekends and major holidays.  You do have access to a nurse 24-7, just call the main number to the clinic [phone] and do not press any options, hold on the line and a nurse will answer the phone.    For prescription refill requests, have your pharmacy contact our office and allow 72 hours.     Due to Covid, you will need to wear a mask upon entering the hospital. If you do not have a mask, a mask will be given to you at the Main Entrance upon arrival. For doctor visits, patients may have 1 support person age 55 or older with them. For treatment visits, patients can not have anyone with them due to social distancing guidelines and our immunocompromised population.

## 2024-02-07 NOTE — Progress Notes (Signed)
 [facility] [address], [place] [address]    Clinic Day:  02/07/2024  Referring physician: [name], NP  Patient Care Team: [name], NP as PCP - General (Adult Health Nurse Practitioner) West Bali, MD (Inactive) as Consulting Physician (Gastroenterology) Doreatha Massed, MD as Medical Oncologist (Medical Oncology) Therese Sarah, RN as Oncology Nurse Navigator (Medical Oncology) Van Clines, MD as Consulting Physician (Neurology)   ASSESSMENT & PLAN:   Assessment: 1.  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  Rectal mass biopsy consistent with moderately differentiated adenocarcinoma. - CEA (04/06/2022): 25.7. - CT CAP (04/27/2022): No evidence of mass, lymphadenopathy or metastatic disease in the chest, abdomen or pelvis.  Clustered subsolid nodular opacities in the superior segment of the left lower lobe measures 0.6 cm. - Rectal MRI (05/10/2022): Lesion is in the sigmoid colon measuring 4 cm with findings for small volume extension into sigmoid mesocolon.  Intermittent association with adjacent uterus without gross invasion.  Sigmoid mesocolon node, borderline size.  Right external iliac nodes are favored to be reactive. - Low anterior resection (08/11/2022) by Dr. Maisie Fus - Pathology: Moderately differentiated adenocarcinoma, margins negative.  Site of tumor is rectosigmoid junction.  0/14 lymph nodes involved.  Negative LVI and PNI.  PT3 pN0.  MMR preserved.   2.  Social/family history: - She is a resident at [facility] nursing home for the past few months due to falls from right foot drop and numbness in the right hand, right foot and lower leg.  Previously she worked at the nursing home.  She is a non-smoker. - Maternal grandmother had colon cancer.    Plan: 1.  Stage II (T3 N0 M0)  rectosigmoid junction adenocarcinoma, MMR preserved: - CTAP (12/27/2022): Ill-defined heterogeneous hypodensity along the falciform ligament is once again seen, increased from prior measuring 3.9 x 2.5 cm.  Findings are nonspecific and may reflect focal fatty infiltration/differential perfusion.  Stable mildly enlarged right common iliac lymph node measuring 9 mm in short axis.  No new lymphadenopathy. - She did not have any bleeding per rectum or melena.  She is a resident at [name] rehab facility for diabetic foot ulcer. - Because of the CT scan we have ordered MRI of the liver.  MRI liver from 01/31/2024 shows no evidence of hepatic metastatic disease.  The area of interest adjacent to the gallbladder fossa appears due to differential perfusion/postoperative change.  No further workup necessary. - RTC 3 months with CEA, and LFTs.   2.  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 - Latest hemoglobin is 13.7.  Continue multivitamin 1 tablet daily.    Orders Placed This Encounter  Procedures   CBC with Differential    Standing Status:   Future    Expected Date:   05/07/2024    Expiration Date:   02/06/2025   Comprehensive metabolic panel    Standing Status:   Future    Expected Date:   05/07/2024    Expiration Date:   02/06/2025   CEA    Standing Status:   Future    Expected Date:   05/07/2024    Expiration Date:   02/06/2025      Mikeal Hawthorne R Teague,acting as a scribe for Doreatha Massed, MD.,have documented all relevant documentation on the behalf of Doreatha Massed, MD,as directed by  Doreatha Massed, MD while  in the presence of Doreatha Massed, MD.  I, Doreatha Massed MD, have reviewed the above documentation for accuracy and completeness, and I agree with the above.    Doreatha Massed, MD   3/4/20252:46 PM  CHIEF COMPLAINT:   Diagnosis: stage II rectosigmoid cancer    Cancer Staging  Cancer of sigmoid colon  [facility]) Staging form: Colon and Rectum, AJCC 8th Edition - Clinical stage from 05/12/2022: Stage IIA (cT3, cN0, cM0) - Signed by Doreatha Massed, MD on 09/08/2022    Prior Therapy: Low anterior resection on 08/11/2022   Current Therapy:  Surveillance    HISTORY OF PRESENT ILLNESS:   Oncology History  Cancer of sigmoid colon (HCC)  05/12/2022 Initial Diagnosis   Cancer of sigmoid colon (HCC)   05/12/2022 Cancer Staging   Staging form: Colon and Rectum, AJCC 8th Edition - Clinical stage from 05/12/2022: Stage IIA (cT3, cN0, cM0) - Signed by Doreatha Massed, MD on 09/08/2022 Histopathologic type: Adenocarcinoma, NOS Total positive nodes: 0 Histologic grade (G): G2 Histologic grading system: 4 grade system      INTERVAL HISTORY:   [name] is a 63 y.o. female presenting to clinic today for follow up of stage II rectosigmoid cancer. She was last seen by me on 01/23/24.  Since her last visit, she underwent MRI liver on 01/31/24 that found: No evidence of hepatic metastatic disease or other significant abnormality. [name] has a cytoscopy on 02/16/24 with Dr. Ronne Binning.  Today, she states that she is doing well overall. Her appetite level is at 100%. Her energy level is at 0%. She is accompanied by her husband.   PAST MEDICAL HISTORY:   Past Medical History: Past Medical History:  Diagnosis Date   Anemia    Arthritis    Asthma    Cancer of sigmoid (HCC)    COPD (chronic obstructive pulmonary disease) (HCC)    Depression    Diabetes mellitus    Diastolic dysfunction 05/15/2015   Dyspnea    Generalized weakness    Hyperlipidemia    Hypertension    Hypothyroidism    Obesity    Palpitations    Pneumonia    PONV (postoperative nausea and vomiting)    Prolonged QT interval 05/14/2015   Sleep apnea     Surgical History: Past Surgical History:  Procedure Laterality Date   BIOPSY  04/06/2022   Procedure: BIOPSY;  Surgeon: Dolores Frame, MD;  [facility];   Service: Gastroenterology;;   CATARACT EXTRACTION     CESAREAN SECTION     CHOLECYSTECTOMY     COLONOSCOPY WITH PROPOFOL N/A 04/06/2022   Procedure: COLONOSCOPY WITH PROPOFOL;  Surgeon: Dolores Frame, MD;  Location: [facility];  Service: Gastroenterology;  Laterality: N/A;  805 ASA 2 patient in [facility]   ESOPHAGOGASTRODUODENOSCOPY (EGD) WITH PROPOFOL N/A 04/06/2022   Procedure: ESOPHAGOGASTRODUODENOSCOPY (EGD) WITH PROPOFOL;  Surgeon: Dolores Frame, MD;  [facility]: [facility];  Service: Gastroenterology;  Laterality: N/A;   HEMOSTASIS CLIP PLACEMENT  04/06/2022   Procedure: HEMOSTASIS CLIP PLACEMENT;  Surgeon: Dolores Frame, MD;  [facility]: [facility];  Service: Gastroenterology;;   POLYPECTOMY  04/06/2022   Procedure: POLYPECTOMY;  Surgeon: Dolores Frame, MD;  [facility];  Service: Gastroenterology;;   SUBMUCOSAL TATTOO INJECTION  04/06/2022   Procedure: SUBMUCOSAL TATTOO INJECTION;  Surgeon: Dolores Frame, MD;  [facility]: [facility];  Service: Gastroenterology;;    Social History: Social History   Socioeconomic History  Marital status: Married    Spouse name: Not on file   Number of children: Not on file   Years of education: Not on file   Highest education level: Not on file  Occupational History   Not on file  Tobacco Use   Smoking status: Never    Passive exposure: Yes   Smokeless tobacco: Never  Vaping Use   Vaping status: Never Used  Substance and Sexual Activity   Alcohol use: No    Alcohol/week: 0.0 standard drinks of alcohol   Drug use: No   Sexual activity: Yes    Birth control/protection: None  Other Topics Concern   Not on file  Social History Narrative   Lives with husband.  One living son.  Daughter died after transplant age 33.     Are you right handed or left handed? Right   Are you currently employed ? 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    What type of home do you live in: 1 story or 2 story? one       Social Drivers of Corporate investment banker Strain: Medium Risk (08/14/2021)   Received from [facility]   Overall Financial Resource Strain (CARDIA)    Difficulty of Paying Living Expenses: Somewhat hard  Food Insecurity: No Food Insecurity (11/12/2023)   Hunger Vital Sign    Worried About Running Out of Food in the Last Year: Never true    Ran Out of Food in the Last Year: Never true  Transportation Needs: No Transportation Needs (11/12/2023)   PRAPARE - Administrator, Civil Service (Medical): No    Lack of Transportation (Non-Medical): No  Physical Activity: Inactive (08/14/2021)   Received from [facility]   Exercise Vital Sign    Days of Exercise per Week: 0 days    Minutes of Exercise per Session: 0 min  Stress: Stress Concern Present (08/14/2021)   Received from [facility] - Occupational Stress Questionnaire    Feeling of Stress : Very much  Social Connections: Unknown (04/08/2022)   Received from [facility]   Social Network    Social Network: Not on file  Intimate Partner Violence: Not At Risk (11/12/2023)   Humiliation, Afraid, Rape, and Kick questionnaire    Fear of Current or Ex-Partner: No    Emotionally Abused: No    Physically Abused: No    Sexually Abused: No    Family History: Family History  Problem Relation Age of Onset   Stroke Mother    Diabetes Father    Heart failure Father    Hypertension Father    Diabetes Sister    Heart failure Sister    Hypertension Sister    Stroke Sister    Cancer Other    Stroke Sister     Current Medications:  Current Outpatient Medications:    acetaminophen (TYLENOL) 650 MG CR tablet, Take 650 mg by mouth every 8 (eight) hours as needed for pain., Disp: , Rfl:    AIRSUPRA 90-80 MCG/ACT AERO,  Inhale into the lungs., Disp: , Rfl:    albuterol (VENTOLIN HFA) 108 (90 Base) MCG/ACT inhaler, Inhale 2 puffs into the lungs every 6 (six) hours as needed for wheezing or shortness of breath., Disp: , Rfl:    alum & mag hydroxide-simeth (MAALOX/MYLANTA) 200-200-20 MG/5ML suspension,  Take 30 mLs by mouth every 2 (two) hours as needed for indigestion or heartburn. Do not exceed 4 doses in 24 hours, Disp: , Rfl:    Amino Acids-Protein Hydrolys (FEEDING SUPPLEMENT, PRO-STAT 64,) LIQD, Take 30 mLs by mouth daily., Disp: , Rfl:    amLODipine (NORVASC) 10 MG tablet, Take 10 mg by mouth daily., Disp: , Rfl:    ascorbic acid (VITAMIN C) 500 MG tablet, Take 500 mg by mouth 2 (two) times daily., Disp: , Rfl:    aspirin EC 81 MG tablet, Take 81 mg by mouth daily. Swallow whole., Disp: , Rfl:    atorvastatin (LIPITOR) 10 MG tablet, Take 10 mg by mouth at bedtime., Disp: , Rfl:    augmented betamethasone dipropionate (DIPROLENE-AF) 0.05 % cream, Apply topically 2 (two) times daily., Disp: , Rfl:    BD AUTOSHIELD DUO 30G X 5 MM MISC, , Disp: , Rfl:    Biotin 10 MG TABS, Take 1 tablet by mouth daily., Disp: , Rfl:    cetirizine (ZYRTEC) 10 MG tablet, Take 10 mg by mouth daily., Disp: , Rfl:    Cholecalciferol (VITAMIN D) 50 MCG (2000 UT) CAPS, Take 2,000 Units by mouth daily., Disp: , Rfl:    Cranberry 600 MG TABS, Take 300 mg by mouth 2 (two) times daily. Half tab bid, Disp: , Rfl:    docusate sodium (COLACE) 100 MG capsule, Take 100 mg by mouth daily., Disp: , Rfl:    estradiol (ESTRACE) 0.1 MG/GM vaginal cream, Discard plastic applicator. Insert a blueberry size amount (approximately 1 gram) of cream on fingertip inside vagina at bedtime every night for 1 week then every other night routinely (for long term use)., Disp: 30 g, Rfl: 3   fluticasone furoate-vilanterol (BREO ELLIPTA) 100-25 MCG/ACT AEPB, Inhale 1 puff into the lungs daily., Disp: 180 each, Rfl: 5   furosemide (LASIX) 20 MG tablet, Take 20 mg by  mouth daily., Disp: , Rfl:    gabapentin (NEURONTIN) 300 MG capsule, Take 1 capsule in AM, 1 capsule in PM (Patient taking differently: Take 300 mg by mouth 2 (two) times daily.), Disp: 180 capsule, Rfl: 3   hydrALAZINE (APRESOLINE) 25 MG tablet, Take 1 tablet (25 mg total) by mouth every 8 (eight) hours., Disp: , Rfl:    ipratropium-albuterol (DUONEB) 0.5-2.5 (3) MG/3ML SOLN, Take 3 mLs by nebulization every 4 (four) hours as needed., Disp: , Rfl:    LANTUS 100 UNIT/ML injection, Inject 0.5 mLs (50 Units total) into the skin at bedtime., Disp: 45 mL, Rfl: 3   levothyroxine (SYNTHROID) 88 MCG tablet, Take 88 mcg by mouth daily before breakfast., Disp: , Rfl:    Menthol-Zinc Oxide (CALMOSEPTINE) 0.44-20.6 % OINT, Apply 1 Application topically as needed (preservation/protection after incontinent care)., Disp: , Rfl:    Multiple Vitamins-Minerals (MULTIVITAMIN WITH MINERALS) tablet, Take 1 tablet by mouth daily., Disp: , Rfl:    MYRBETRIQ 50 MG TB24 tablet, Take 1 tablet (50 mg total) by mouth daily., Disp: 30 tablet, Rfl: 11   NOVOLOG 100 UNIT/ML injection, Inject 10-16 Units into the skin 3 (three) times daily with meals. 90-150= 10 units; 151-200= 11 units; 201-250= 12 units; 251-300= 13 units; 301-350= 14 units; 351-400= 15 units; above 400 = 16 units, Disp: 45 mL, Rfl: 3   nystatin cream (MYCOSTATIN), Apply 1 Application topically 2 (two) times daily., Disp: , Rfl:    omeprazole (PRILOSEC) 20 MG capsule, Take 20 mg by mouth daily., Disp: , Rfl:    ondansetron (ZOFRAN)  4 MG tablet, Take 8 mg by mouth 2 (two) times daily., Disp: , Rfl:    potassium chloride SA (K-DUR) 20 MEQ tablet, Take 20 mEq by mouth daily., Disp: , Rfl:    propranolol (INDERAL) 80 MG tablet, Take 80 mg by mouth daily., Disp: , Rfl:    saccharomyces boulardii (FLORASTOR) 250 MG capsule, Take 250 mg by mouth 2 (two) times daily., Disp: , Rfl:    sertraline (ZOLOFT) 100 MG tablet, Take 100 mg by mouth daily., Disp: , Rfl:     trimethoprim (TRIMPEX) 100 MG tablet, Take 1 tablet (100 mg total) by mouth daily., Disp: 30 tablet, Rfl: 11   Allergies: Allergies  Allergen Reactions   Carvedilol Palpitations   Benicar [Olmesartan] Swelling   Codeine Other (See Comments)    Confusion    Sulfa Antibiotics Swelling    Whole face swells   Trulicity [Dulaglutide] Diarrhea   Ceftriaxone Hives and Rash    REVIEW OF SYSTEMS:   Review of Systems  Constitutional:  Negative for chills, fatigue and fever.  HENT:   Negative for lump/mass, mouth sores, nosebleeds, sore throat and trouble swallowing.   Eyes:  Negative for eye problems.  Respiratory:  Positive for cough and shortness of breath.   Cardiovascular:  Negative for chest pain, leg swelling and palpitations.  Gastrointestinal:  Negative for abdominal pain, constipation, diarrhea, nausea and vomiting.  Genitourinary:  Positive for dysuria. Negative for bladder incontinence, difficulty urinating, frequency, hematuria and nocturia.   Musculoskeletal:  Negative for arthralgias, back pain, flank pain, myalgias and neck pain.  Skin:  Negative for itching and rash.  Neurological:  Positive for dizziness and numbness (and tingling in hands and feet). Negative for headaches.  Hematological:  Does not bruise/bleed easily.  Psychiatric/Behavioral:  Positive for depression. Negative for sleep disturbance and suicidal ideas. The patient is nervous/anxious.   All other systems reviewed and are negative.    VITALS:   Blood pressure (!) 146/63, pulse 71, temperature 98.8 F (37.1 C), temperature source Tympanic, resp. rate 18, height 5\' 1"  (1.549 m), weight 263 lb (119.3 kg), last menstrual period 08/06/2012, SpO2 94%.  Wt Readings from Last 3 Encounters:  02/07/24 263 lb (119.3 kg)  01/23/24 263 lb 14.3 oz (119.7 kg)  01/11/24 262 lb 12.8 oz (119.2 kg)    Body mass index is 49.69 kg/m.  Performance status (ECOG): 2 - Symptomatic, <50% confined to bed  PHYSICAL EXAM:    Physical Exam Vitals and nursing note reviewed. Exam conducted with a chaperone present.  Constitutional:      Appearance: Normal appearance.  Cardiovascular:     Rate and Rhythm: Normal rate and regular rhythm.     Pulses: Normal pulses.     Heart sounds: Normal heart sounds.  Pulmonary:     Effort: Pulmonary effort is normal.     Breath sounds: Normal breath sounds.  Abdominal:     Palpations: Abdomen is soft. There is no hepatomegaly, splenomegaly or mass.     Tenderness: There is no abdominal tenderness.  Musculoskeletal:     Right lower leg: No edema.     Left lower leg: No edema.  Lymphadenopathy:     Cervical: No cervical adenopathy.     Right cervical: No superficial, deep or posterior cervical adenopathy.    Left cervical: No superficial, deep or posterior cervical adenopathy.     Upper Body:     Right upper body: No supraclavicular or axillary adenopathy.     Left upper  body: No supraclavicular or axillary adenopathy.  Neurological:     General: No focal deficit present.     Mental Status: She is alert and oriented to person, place, and time.  Psychiatric:        Mood and Affect: Mood normal.        Behavior: Behavior normal.     LABS:   CBC     Component Value Date/Time   WBC 10.2 12/28/2023 1101   RBC 5.05 12/28/2023 1101   HGB 13.7 12/28/2023 1101   HCT 42.7 12/28/2023 1101   PLT 318 12/28/2023 1101   MCV 84.6 12/28/2023 1101   MCH 27.1 12/28/2023 1101   MCHC 32.1 12/28/2023 1101   RDW 15.4 12/28/2023 1101   LYMPHSABS 1.6 12/28/2023 1101   MONOABS 0.5 12/28/2023 1101   EOSABS 0.6 (H) 12/28/2023 1101   BASOSABS 0.1 12/28/2023 1101    CMP      Component Value Date/Time   NA 138 12/28/2023 1101   K 3.8 12/28/2023 1101   CL 97 (L) 12/28/2023 1101   CO2 32 12/28/2023 1101   GLUCOSE 268 (H) 12/28/2023 1101   BUN 27 (H) 12/28/2023 1101   CREATININE 1.64 (H) 12/28/2023 1101   CALCIUM 9.8 12/28/2023 1101   PROT 7.2 12/28/2023 1101   ALBUMIN  3.2 (L) 12/28/2023 1101   AST 19 12/28/2023 1101   ALT 14 12/28/2023 1101   ALKPHOS 102 12/28/2023 1101   BILITOT 0.6 12/28/2023 1101   GFRNONAA 35 (L) 12/28/2023 1101   GFRAA >60 12/26/2019 1530     Lab Results  Component Value Date   CEA1 3.3 12/28/2023   /  CEA  Date Value Ref Range Status  12/28/2023 3.3 0.0 - 4.7 ng/mL Final    Comment:    (NOTE)                             Nonsmokers          <3.9                             Smokers             <5.6 [employer] Electrochemiluminescence Immunoassay (ECLIA) Values obtained with different assay methods or kits cannot be used interchangeably.  Results cannot be interpreted as absolute evidence of the presence or absence of malignant disease. Performed At: [facility] [address] [place], [place] [id number] Jolene Schimke MD WU:[id number]    No results found for: "PSA1" No results found for: "CAN199" No results found for: "CAN125"  No results found for: "TOTALPROTELP", "ALBUMINELP", "A1GS", "A2GS", "BETS", "BETA2SER", "GAMS", "MSPIKE", "SPEI" Lab Results  Component Value Date   TIBC 225 (L) 05/04/2023   TIBC 257 01/25/2023   TIBC 257 10/13/2022   FERRITIN 47 05/04/2023   FERRITIN 44 01/25/2023   FERRITIN 18 10/13/2022   IRONPCTSAT 42 (H) 05/04/2023   IRONPCTSAT 27 01/25/2023   IRONPCTSAT 28 10/13/2022   No results found for: "LDH"   STUDIES:   MR LIVER W WO CONTRAST Result Date: 02/06/2024 CLINICAL DATA:  Indeterminate liver lesion recent CT. Colon carcinoma. EXAM: MRI ABDOMEN WITHOUT AND WITH CONTRAST TECHNIQUE: Multiplanar multisequence MR imaging of the abdomen was performed both before and after the administration of intravenous contrast. CONTRAST:  10mL GADAVIST GADOBUTROL 1 MMOL/ML IV SOLN COMPARISON:  CT on 12/28/2023 FINDINGS: Lower chest: No acute findings. Hepatobiliary: No  hepatic masses identified. Prior cholecystectomy noted. Poorly defined area of delayed hyperenhancement is seen  adjacent to the gallbladder fossa, consistent with differential perfusion or postop change, which corresponds to the lesion seen on prior CT. No evidence of biliary ductal dilatation. Pancreas:  No mass or inflammatory changes. Spleen:  Within normal limits in size and appearance. Adrenals/Urinary Tract: No suspicious masses identified. No evidence of hydronephrosis. Stomach/Bowel: Unremarkable. Vascular/Lymphatic: No pathologically enlarged lymph nodes identified. No acute vascular findings. Other:  None. Musculoskeletal:  No suspicious bone lesions identified. IMPRESSION: No evidence of hepatic metastatic disease or other significant abnormality. Electronically Signed   By: Danae Orleans M.D.   On: 02/06/2024 11:48

## 2024-02-10 LAB — LAB REPORT - SCANNED
A1c: 11.1
EGFR (Non-African Amer.): 36

## 2024-02-13 ENCOUNTER — Encounter (HOSPITAL_BASED_OUTPATIENT_CLINIC_OR_DEPARTMENT_OTHER): Payer: Medicare HMO | Attending: General Surgery | Admitting: General Surgery

## 2024-02-13 DIAGNOSIS — E1065 Type 1 diabetes mellitus with hyperglycemia: Secondary | ICD-10-CM | POA: Insufficient documentation

## 2024-02-13 DIAGNOSIS — L97412 Non-pressure chronic ulcer of right heel and midfoot with fat layer exposed: Secondary | ICD-10-CM | POA: Diagnosis not present

## 2024-02-13 DIAGNOSIS — I5032 Chronic diastolic (congestive) heart failure: Secondary | ICD-10-CM | POA: Insufficient documentation

## 2024-02-13 DIAGNOSIS — E10621 Type 1 diabetes mellitus with foot ulcer: Secondary | ICD-10-CM | POA: Insufficient documentation

## 2024-02-13 DIAGNOSIS — E11621 Type 2 diabetes mellitus with foot ulcer: Secondary | ICD-10-CM | POA: Diagnosis not present

## 2024-02-14 ENCOUNTER — Other Ambulatory Visit: Payer: Self-pay | Admitting: Nurse Practitioner

## 2024-02-14 ENCOUNTER — Encounter (HOSPITAL_COMMUNITY)
Admission: RE | Admit: 2024-02-14 | Discharge: 2024-02-14 | Disposition: A | Discharge status: Home / Self Care | Payer: Medicare HMO | Source: Ambulatory Visit | Attending: Urology | Admitting: Urology

## 2024-02-14 MED ORDER — NOVOLOG 100 UNIT/ML IJ SOLN: 14.0000 [IU] | Freq: Three times a day (TID) | INTRAMUSCULAR | 3 refills | Status: DC

## 2024-02-14 MED ORDER — LANTUS 100 UNIT/ML ~~LOC~~ SOLN
70.0000 [IU] | Freq: Every day | SUBCUTANEOUS | 3 refills | Status: DC
Start: 2024-02-14 — End: 2024-04-16

## 2024-02-14 NOTE — Pre-Procedure Instructions (Signed)
 Attempted pre-op phonecall. Left VM for her to call us back.

## 2024-02-15 ENCOUNTER — Encounter (HOSPITAL_COMMUNITY): Payer: Self-pay

## 2024-02-15 DIAGNOSIS — M6281 Muscle weakness (generalized): Secondary | ICD-10-CM | POA: Diagnosis not present

## 2024-02-15 DIAGNOSIS — E039 Hypothyroidism, unspecified: Secondary | ICD-10-CM | POA: Diagnosis not present

## 2024-02-15 DIAGNOSIS — J302 Other seasonal allergic rhinitis: Secondary | ICD-10-CM | POA: Diagnosis not present

## 2024-02-15 DIAGNOSIS — N1832 Chronic kidney disease, stage 3b: Secondary | ICD-10-CM | POA: Diagnosis not present

## 2024-02-15 DIAGNOSIS — D649 Anemia, unspecified: Secondary | ICD-10-CM | POA: Diagnosis not present

## 2024-02-15 DIAGNOSIS — J189 Pneumonia, unspecified organism: Secondary | ICD-10-CM | POA: Diagnosis not present

## 2024-02-15 DIAGNOSIS — I5032 Chronic diastolic (congestive) heart failure: Secondary | ICD-10-CM | POA: Diagnosis not present

## 2024-02-15 DIAGNOSIS — E1165 Type 2 diabetes mellitus with hyperglycemia: Secondary | ICD-10-CM | POA: Diagnosis not present

## 2024-02-15 DIAGNOSIS — I10 Essential (primary) hypertension: Secondary | ICD-10-CM | POA: Diagnosis not present

## 2024-02-15 DIAGNOSIS — E785 Hyperlipidemia, unspecified: Secondary | ICD-10-CM | POA: Diagnosis not present

## 2024-02-15 DIAGNOSIS — E559 Vitamin D deficiency, unspecified: Secondary | ICD-10-CM | POA: Diagnosis not present

## 2024-02-15 NOTE — Pre-Procedure Instructions (Signed)
 After 3rd attempt, pre-op phone call was done. Spoke with [facility] about pre-op instructions and meds. She states patient can sign for herself.

## 2024-02-15 NOTE — Pre-Procedure Instructions (Signed)
 Attempted pre-op phonecall. Left VM for her to call us back.

## 2024-02-16 ENCOUNTER — Encounter (HOSPITAL_COMMUNITY)
Admission: RE | Disposition: A | Discharge status: Home / Self Care | Payer: Self-pay | Source: Home / Self Care | Attending: Urology

## 2024-02-16 ENCOUNTER — Ambulatory Visit (HOSPITAL_COMMUNITY): Admitting: Certified Registered"

## 2024-02-16 ENCOUNTER — Other Ambulatory Visit: Payer: Self-pay

## 2024-02-16 ENCOUNTER — Encounter (HOSPITAL_COMMUNITY): Payer: Self-pay | Admitting: Urology

## 2024-02-16 ENCOUNTER — Ambulatory Visit (HOSPITAL_COMMUNITY)
Admission: RE | Admit: 2024-02-16 | Discharge: 2024-02-16 | Disposition: A | Discharge status: Home / Self Care | Payer: Medicare HMO | Attending: Urology | Admitting: Urology

## 2024-02-16 ENCOUNTER — Ambulatory Visit (HOSPITAL_BASED_OUTPATIENT_CLINIC_OR_DEPARTMENT_OTHER): Admitting: Certified Registered"

## 2024-02-16 DIAGNOSIS — Z8249 Family history of ischemic heart disease and other diseases of the circulatory system: Secondary | ICD-10-CM | POA: Diagnosis not present

## 2024-02-16 DIAGNOSIS — E039 Hypothyroidism, unspecified: Secondary | ICD-10-CM | POA: Diagnosis not present

## 2024-02-16 DIAGNOSIS — I509 Heart failure, unspecified: Secondary | ICD-10-CM | POA: Diagnosis not present

## 2024-02-16 DIAGNOSIS — F32A Depression, unspecified: Secondary | ICD-10-CM | POA: Insufficient documentation

## 2024-02-16 DIAGNOSIS — N289 Disorder of kidney and ureter, unspecified: Secondary | ICD-10-CM | POA: Insufficient documentation

## 2024-02-16 DIAGNOSIS — N3281 Overactive bladder: Secondary | ICD-10-CM | POA: Insufficient documentation

## 2024-02-16 DIAGNOSIS — I5033 Acute on chronic diastolic (congestive) heart failure: Secondary | ICD-10-CM

## 2024-02-16 DIAGNOSIS — Z833 Family history of diabetes mellitus: Secondary | ICD-10-CM | POA: Insufficient documentation

## 2024-02-16 DIAGNOSIS — J4489 Other specified chronic obstructive pulmonary disease: Secondary | ICD-10-CM | POA: Insufficient documentation

## 2024-02-16 DIAGNOSIS — R3915 Urgency of urination: Secondary | ICD-10-CM | POA: Diagnosis not present

## 2024-02-16 DIAGNOSIS — E119 Type 2 diabetes mellitus without complications: Secondary | ICD-10-CM | POA: Diagnosis not present

## 2024-02-16 DIAGNOSIS — K219 Gastro-esophageal reflux disease without esophagitis: Secondary | ICD-10-CM | POA: Diagnosis not present

## 2024-02-16 DIAGNOSIS — Z5986 Financial insecurity: Secondary | ICD-10-CM | POA: Diagnosis not present

## 2024-02-16 DIAGNOSIS — F419 Anxiety disorder, unspecified: Secondary | ICD-10-CM | POA: Diagnosis not present

## 2024-02-16 DIAGNOSIS — G4730 Sleep apnea, unspecified: Secondary | ICD-10-CM | POA: Insufficient documentation

## 2024-02-16 DIAGNOSIS — G4733 Obstructive sleep apnea (adult) (pediatric): Secondary | ICD-10-CM

## 2024-02-16 DIAGNOSIS — R351 Nocturia: Secondary | ICD-10-CM | POA: Insufficient documentation

## 2024-02-16 DIAGNOSIS — E6689 Other obesity not elsewhere classified: Secondary | ICD-10-CM | POA: Diagnosis not present

## 2024-02-16 DIAGNOSIS — J189 Pneumonia, unspecified organism: Secondary | ICD-10-CM | POA: Diagnosis not present

## 2024-02-16 DIAGNOSIS — I110 Hypertensive heart disease with heart failure: Secondary | ICD-10-CM

## 2024-02-16 DIAGNOSIS — G709 Myoneural disorder, unspecified: Secondary | ICD-10-CM | POA: Insufficient documentation

## 2024-02-16 DIAGNOSIS — M6281 Muscle weakness (generalized): Secondary | ICD-10-CM | POA: Diagnosis not present

## 2024-02-16 DIAGNOSIS — Z79899 Other long term (current) drug therapy: Secondary | ICD-10-CM | POA: Insufficient documentation

## 2024-02-16 DIAGNOSIS — Z794 Long term (current) use of insulin: Secondary | ICD-10-CM | POA: Diagnosis not present

## 2024-02-16 HISTORY — PX: CYSTOSCOPY WITH INJECTION: SHX1424

## 2024-02-16 LAB — GLUCOSE, CAPILLARY
Glucose-Capillary: 182 mg/dL — ABNORMAL HIGH (ref 70–99)
Glucose-Capillary: 209 mg/dL — ABNORMAL HIGH (ref 70–99)

## 2024-02-16 SURGERY — CYSTOSCOPY, WITH INJECTION OF BLADDER NECK OR BLADDER WALL
Anesthesia: General | Site: Bladder

## 2024-02-16 MED ORDER — LIDOCAINE HCL (PF) 2 % IJ SOLN
INTRAMUSCULAR | Status: AC
  Filled 2024-02-16: qty 5

## 2024-02-16 MED ORDER — GLYCOPYRROLATE PF 0.2 MG/ML IJ SOSY
PREFILLED_SYRINGE | INTRAMUSCULAR | Status: DC | PRN
  Administered 2024-02-16: .2 mg via INTRAVENOUS

## 2024-02-16 MED ORDER — DEXAMETHASONE SODIUM PHOSPHATE 10 MG/ML IJ SOLN
INTRAMUSCULAR | Status: AC
  Filled 2024-02-16: qty 1

## 2024-02-16 MED ORDER — ONDANSETRON HCL 4 MG/2ML IJ SOLN
INTRAMUSCULAR | Status: AC
  Filled 2024-02-16: qty 2

## 2024-02-16 MED ORDER — GLYCOPYRROLATE PF 0.2 MG/ML IJ SOSY
PREFILLED_SYRINGE | INTRAMUSCULAR | Status: AC
  Filled 2024-02-16: qty 1

## 2024-02-16 MED ORDER — SODIUM CHLORIDE 0.9 % IR SOLN
Status: DC | PRN
  Administered 2024-02-16: 3000 mL

## 2024-02-16 MED ORDER — SODIUM CHLORIDE (PF) 0.9 % IJ SOLN
INTRAMUSCULAR | Status: DC | PRN
  Administered 2024-02-16: 20 mL

## 2024-02-16 MED ORDER — CEFAZOLIN SODIUM-DEXTROSE 2-4 GM/100ML-% IV SOLN
2.0000 g | INTRAVENOUS | Status: AC
  Administered 2024-02-16: 2 g via INTRAVENOUS
  Filled 2024-02-16: qty 100

## 2024-02-16 MED ORDER — LACTATED RINGERS IV SOLN: INTRAVENOUS | Status: DC | PRN

## 2024-02-16 MED ORDER — LIDOCAINE HCL (PF) 2 % IJ SOLN
INTRAMUSCULAR | Status: DC | PRN
  Administered 2024-02-16: 100 mg

## 2024-02-16 MED ORDER — OXYCODONE HCL 5 MG/5ML PO SOLN: 5.0000 mg | Freq: Once | ORAL | Status: DC | PRN

## 2024-02-16 MED ORDER — OXYCODONE HCL 5 MG PO TABS: 5.0000 mg | ORAL_TABLET | Freq: Once | ORAL | Status: DC | PRN

## 2024-02-16 MED ORDER — WATER FOR IRRIGATION, STERILE IR SOLN
Status: DC | PRN
  Administered 2024-02-16: 500 mL

## 2024-02-16 MED ORDER — PROPOFOL 10 MG/ML IV BOLUS
INTRAVENOUS | Status: AC
  Filled 2024-02-16: qty 20

## 2024-02-16 MED ORDER — FENTANYL CITRATE (PF) 250 MCG/5ML IJ SOLN
INTRAMUSCULAR | Status: DC | PRN
  Administered 2024-02-16: 25 ug via INTRAVENOUS

## 2024-02-16 MED ORDER — FENTANYL CITRATE (PF) 100 MCG/2ML IJ SOLN
INTRAMUSCULAR | Status: AC
  Filled 2024-02-16: qty 2

## 2024-02-16 MED ORDER — MIDAZOLAM HCL 2 MG/2ML IJ SOLN
INTRAMUSCULAR | Status: AC
  Filled 2024-02-16: qty 2

## 2024-02-16 MED ORDER — DEXAMETHASONE SODIUM PHOSPHATE 10 MG/ML IJ SOLN
INTRAMUSCULAR | Status: DC | PRN
  Administered 2024-02-16: 10 mg via INTRAVENOUS

## 2024-02-16 MED ORDER — ONDANSETRON HCL 4 MG/2ML IJ SOLN
INTRAMUSCULAR | Status: DC | PRN
  Administered 2024-02-16: 4 mg via INTRAVENOUS

## 2024-02-16 MED ORDER — SODIUM CHLORIDE (PF) 0.9 % IJ SOLN
INTRAMUSCULAR | Status: AC
  Filled 2024-02-16: qty 20

## 2024-02-16 MED ORDER — FENTANYL CITRATE PF 50 MCG/ML IJ SOSY: 25.0000 ug | PREFILLED_SYRINGE | INTRAMUSCULAR | Status: DC | PRN

## 2024-02-16 MED ORDER — ONABOTULINUMTOXINA 100 UNITS IJ SOLR
100.0000 [IU] | Freq: Once | INTRAMUSCULAR | Status: DC
  Filled 2024-02-16: qty 100

## 2024-02-16 MED ORDER — MIDAZOLAM HCL 5 MG/5ML IJ SOLN
INTRAMUSCULAR | Status: DC | PRN
  Administered 2024-02-16: 2 mg via INTRAVENOUS

## 2024-02-16 MED ORDER — ONABOTULINUMTOXINA 100 UNITS IJ SOLR
INTRAMUSCULAR | Status: DC | PRN
  Administered 2024-02-16: 100 [IU] via INTRAMUSCULAR

## 2024-02-16 MED ORDER — PROPOFOL 10 MG/ML IV BOLUS
INTRAVENOUS | Status: DC | PRN
  Administered 2024-02-16: 100 mg via INTRAVENOUS

## 2024-02-16 SURGICAL SUPPLY — 21 items
BAG DRAIN URO TABLE W/ADPT NS (BAG) ×1 IMPLANT
BAG HAMPER (MISCELLANEOUS) ×1 IMPLANT
CLOTH BEACON ORANGE TIMEOUT ST (SAFETY) ×1 IMPLANT
GLOVE BIO SURGEON STRL SZ8 (GLOVE) ×1 IMPLANT
GLOVE BIOGEL PI IND STRL 7.0 (GLOVE) ×2 IMPLANT
GOWN STRL REUS W/TWL LRG LVL3 (GOWN DISPOSABLE) ×1 IMPLANT
GOWN STRL REUS W/TWL XL LVL3 (GOWN DISPOSABLE) ×1 IMPLANT
KIT TURNOVER CYSTO (KITS) ×1 IMPLANT
MANIFOLD NEPTUNE II (INSTRUMENTS) ×1 IMPLANT
NDL ASPIRATION 22 (NEEDLE) ×1 IMPLANT
NDL HYPO 18GX1.5 BLUNT FILL (NEEDLE) ×1 IMPLANT
NEEDLE ASPIRATION 22 (NEEDLE) ×1 IMPLANT
NEEDLE HYPO 18GX1.5 BLUNT FILL (NEEDLE) ×1 IMPLANT
PACK CYSTO (CUSTOM PROCEDURE TRAY) ×1 IMPLANT
PAD ARMBOARD POSITIONER FOAM (MISCELLANEOUS) ×1 IMPLANT
POSITIONER HEAD 8X9X4 ADT (SOFTGOODS) ×1 IMPLANT
SYR 30ML LL (SYRINGE) ×1 IMPLANT
SYR CONTROL 10ML LL (SYRINGE) ×1 IMPLANT
TOWEL OR 17X26 4PK STRL BLUE (TOWEL DISPOSABLE) ×1 IMPLANT
WATER STERILE IRR 3000ML UROMA (IV SOLUTION) ×1 IMPLANT
WATER STERILE IRR 500ML POUR (IV SOLUTION) ×1 IMPLANT

## 2024-02-16 NOTE — Transfer of Care (Signed)
 Immediate Anesthesia Transfer of Care Note  Patient: Patriciaann Clan Thaker  Procedure(s) Performed: CYSTOSCOPY WITH INJECTION- Bladder botox 100 units (Bladder)  Patient Location: PACU  Anesthesia Type:General  Level of Consciousness: awake, alert , and oriented  Airway & Oxygen Therapy: Patient Spontanous Breathing and Patient connected to face mask oxygen  Post-op Assessment: Report given to RN and Post -op Vital signs reviewed and stable  Post vital signs: Reviewed and stable  Last Vitals:  Vitals Value Taken Time  BP 147/58 02/16/24 0821  Temp    Pulse 72 02/16/24 0824  Resp 14 02/16/24 0824  SpO2 100 % 02/16/24 0824  Vitals shown include unfiled device data.  Last Pain:  Vitals:   02/16/24 0653  TempSrc: Oral         Complications: No notable events documented.

## 2024-02-16 NOTE — H&P (Signed)
 HPI: [name] is a 62yo here for intravesical botox for OAB. She reports that a urine sample was obtained at [facility] on Monday 01/02/2024 and was sent for urine culture, which she was told today was positive for bacterial growth. She reports the urine sample was collected due to some mild dysuria and some small pieces of white mucous-like material in the toilet after urinating. Denies fevers, acute weakness / fatigue / mental status change.   She continues to have significant urinary frequency, nocturia, urgency, and urge incontinence daily at baseline. She continues to take Myrbetriq (Mirabegron) 50 mg daily, which she's been on for >6 months. She reports changing her pads / Depends about 4x/day. Voids 4-6 times per day and 3 times per night on average.    She reports the staff at her SNF have been applying her topical vaginal estrogen cream every other night as prescribed.     Fall Screening: Do you usually have a device to assist in your mobility? Yes    Medications:       Current Outpatient Medications  Medication Sig Dispense Refill   acetaminophen (TYLENOL) 650 MG CR tablet Take 650 mg by mouth every 8 (eight) hours as needed for pain.       albuterol (VENTOLIN HFA) 108 (90 Base) MCG/ACT inhaler Inhale 2 puffs into the lungs every 6 (six) hours as needed for wheezing or shortness of breath.       alum & mag hydroxide-simeth (MAALOX/MYLANTA) 200-200-20 MG/5ML suspension Take 30 mLs by mouth every 2 (two) hours as needed for indigestion or heartburn. Do not exceed 4 doses in 24 hours       Amino Acids-Protein Hydrolys (FEEDING SUPPLEMENT, PRO-STAT 64,) LIQD Take 30 mLs by mouth daily.       amLODipine (NORVASC) 5 MG tablet Take 10 mg by mouth daily.       amoxicillin-clavulanate (AUGMENTIN) 875-125 MG tablet Take 1 tablet by mouth 2 (two) times daily for 7 days. 14 tablet 0   ascorbic acid (VITAMIN C) 500 MG tablet Take 500 mg by mouth 2 (two)  times daily.       aspirin EC 81 MG tablet Take 81 mg by mouth daily. Swallow whole.       atorvastatin (LIPITOR) 10 MG tablet Take 10 mg by mouth at bedtime.       Biotin 10 MG TABS Take 1 tablet by mouth daily.       cetirizine (ZYRTEC) 10 MG tablet Take 10 mg by mouth daily.       Cholecalciferol (VITAMIN D) 50 MCG (2000 UT) CAPS Take 2,000 Units by mouth daily.       Cranberry 600 MG TABS Take 300 mg by mouth 2 (two) times daily. Half tab bid       docusate sodium (COLACE) 100 MG capsule Take 100 mg by mouth daily.       estradiol (ESTRACE) 0.1 MG/GM vaginal cream Discard plastic applicator. Insert a blueberry size amount (approximately 1 gram) of cream on fingertip inside vagina at bedtime every night for 1 week then every other night routinely (for long term use). 30 g 3   fluconazole (DIFLUCAN) 100 MG tablet Take 1 tablet (100 mg total) by mouth daily. X 5 more days       fluticasone furoate-vilanterol (BREO ELLIPTA) 100-25 MCG/ACT AEPB Inhale 1 puff into the lungs daily. 180 each 5   furosemide (LASIX) 20 MG tablet Take 20 mg by mouth daily.  gabapentin (NEURONTIN) 300 MG capsule Take 1 capsule in AM, 1 capsule in PM (Patient taking differently: Take 300 mg by mouth 2 (two) times daily.) 180 capsule 3   hydrALAZINE (APRESOLINE) 25 MG tablet Take 1 tablet (25 mg total) by mouth every 8 (eight) hours.       ipratropium-albuterol (DUONEB) 0.5-2.5 (3) MG/3ML SOLN Take 3 mLs by nebulization every 4 (four) hours as needed.       LANTUS 100 UNIT/ML injection Inject 0.4 mLs (40 Units total) into the skin at bedtime.       levothyroxine (SYNTHROID) 88 MCG tablet Take 88 mcg by mouth daily before breakfast.       Menthol-Zinc Oxide (CALMOSEPTINE) 0.44-20.6 % OINT Apply 1 Application topically as needed (preservation/protection after incontinent care).       Multiple Vitamins-Minerals (MULTIVITAMIN WITH MINERALS) tablet Take 1 tablet by mouth daily.       MYRBETRIQ 50 MG TB24 tablet Take 1  tablet (50 mg total) by mouth daily. 30 tablet 11   NOVOLOG 100 UNIT/ML injection Inject 10-16 Units into the skin 3 (three) times daily with meals. 90-150= 10 units; 151-200= 11 units; 201-250= 12 units; 251-300= 13 units; 301-350= 14 units; 351-400= 15 units; above 400 = 16 units 45 mL 3   nystatin cream (MYCOSTATIN) Apply 1 Application topically 2 (two) times daily.       omeprazole (PRILOSEC) 20 MG capsule Take 20 mg by mouth daily.       ondansetron (ZOFRAN) 4 MG tablet Take 8 mg by mouth 2 (two) times daily.       potassium chloride SA (K-DUR) 20 MEQ tablet Take 20 mEq by mouth daily.       propranolol (INDERAL) 80 MG tablet Take 80 mg by mouth daily.       saccharomyces boulardii (FLORASTOR) 250 MG capsule Take 250 mg by mouth 2 (two) times daily.       sertraline (ZOLOFT) 100 MG tablet Take 100 mg by mouth daily.       trimethoprim (TRIMPEX) 100 MG tablet Take 1 tablet (100 mg total) by mouth daily. 30 tablet 11      No current facility-administered medications for this visit.        Allergies: Allergies       Allergies  Allergen Reactions   Carvedilol Palpitations   Benicar [Olmesartan] Swelling   Codeine Other (See Comments)      Confusion    Sulfa Antibiotics Swelling      Whole face swells   Trulicity [Dulaglutide] Diarrhea   Ceftriaxone Hives and Rash            Past Medical History:  Diagnosis Date   Anemia     Arthritis     Asthma     Cancer of sigmoid (HCC)     COPD (chronic obstructive pulmonary disease) (HCC)     Depression     Diabetes mellitus     Diastolic dysfunction 05/15/2015   Dyspnea     Generalized weakness     Hyperlipidemia     Hypertension     Hypothyroidism     Obesity     Palpitations     Pneumonia     PONV (postoperative nausea and vomiting)     Prolonged QT interval 05/14/2015   Sleep apnea               Past Surgical History:  Procedure Laterality Date   BIOPSY   04/06/2022    Procedure:  BIOPSY;  Surgeon: Marguerita Merles,  Reuel Boom, MD;  Location: [facility];  Service: Gastroenterology;;   CATARACT EXTRACTION       CESAREAN SECTION       CHOLECYSTECTOMY       COLONOSCOPY WITH PROPOFOL N/A 04/06/2022    Procedure: COLONOSCOPY WITH PROPOFOL;  Surgeon: Dolores Frame, MD;  Location: [facility];  Service: Gastroenterology;  Laterality: N/A;  805 ASA 2 patient in [facility]   ESOPHAGOGASTRODUODENOSCOPY (EGD) WITH PROPOFOL N/A 04/06/2022    Procedure: ESOPHAGOGASTRODUODENOSCOPY (EGD) WITH PROPOFOL;  Surgeon: Dolores Frame, MD;  [facility]: [facility];  Service: Gastroenterology;  Laterality: N/A;   HEMOSTASIS CLIP PLACEMENT   04/06/2022    Procedure: HEMOSTASIS CLIP PLACEMENT;  Surgeon: Dolores Frame, MD;  Location: [facility];  Service: Gastroenterology;;   POLYPECTOMY   04/06/2022    Procedure: POLYPECTOMY;  Surgeon: Marguerita Merles, Reuel Boom, MD;  Location: [facility];  Service: Gastroenterology;;   SUBMUCOSAL TATTOO INJECTION   04/06/2022    Procedure: SUBMUCOSAL TATTOO INJECTION;  Surgeon: Marguerita Merles, Reuel Boom, MD;  Location: [facility];  Service: Gastroenterology;;             Family History  Problem Relation Age of Onset   Stroke Mother     Diabetes Father     Heart failure Father     Hypertension Father     Diabetes Sister     Heart failure Sister     Hypertension Sister     Stroke Sister     Cancer Other     Stroke Sister          Social History         Socioeconomic History   Marital status: Married      Spouse name: Not on file   Number of children: Not on file   Years of education: Not on file   Highest education level: Not on file  Occupational History   Not on file  Tobacco Use   Smoking status: Never      Passive exposure: Yes   Smokeless tobacco: Never  Vaping Use   Vaping status: Never Used  Substance and Sexual Activity   Alcohol use: No      Alcohol/week: 0.0 standard drinks of alcohol   Drug use: No    Sexual activity: Yes      Birth control/protection: None  Other Topics Concern   Not on file  Social History Narrative    Lives with husband.  One living son.  Daughter died after transplant age 24.      Are you right handed or left handed? Right    Are you currently employed ? 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     What type of home do you live in: 1 story or 2 story? one         Social Drivers of Acupuncturist Strain: Medium Risk (08/14/2021)    Received from [facility], [facility]    Overall Financial Resource Strain (CARDIA)     Difficulty of Paying Living Expenses: Somewhat hard  Food Insecurity: No Food Insecurity (11/12/2023)    Hunger Vital Sign     Worried About Running Out of Food in the Last Year: Never true     Ran Out of Food in the Last Year:  Never true  Transportation Needs: No Transportation Needs (11/12/2023)    PRAPARE - Therapist, art (Medical): No     Lack of Transportation (Non-Medical): No  Physical Activity: Inactive (08/14/2021)    Received from [facility], [facility]    Exercise Vital Sign     Days of Exercise per Week: 0 days     Minutes of Exercise per Session: 0 min  Stress: Stress Concern Present (08/14/2021)    Received from [facility], [facility] - Occupational Stress Questionnaire     Feeling of Stress : Very much  Social Connections: Unknown (04/08/2022)    Received from [facility], [facility]    Social Network     Social Network: Not on file  Intimate Partner Violence: Not At Risk (11/12/2023)    Humiliation, Afraid, Rape, and Kick questionnaire     Fear of Current or Ex-Partner: No     Emotionally Abused: No     Physically Abused: No     Sexually Abused: No      Review of Systems Constitutional: Patient denies any unintentional weight loss or change in  strength lntegumentary: Patient denies any rashes or pruritus Cardiovascular: Patient denies chest pain or syncope Respiratory: Patient denies shortness of breath Gastrointestinal: Patient denies nausea, vomiting, constipation, or diarrhea Musculoskeletal: Patient denies muscle cramps or weakness Neurologic: Patient denies convulsions or seizures Allergic/Immunologic: Patient denies recent allergic reaction(s) Hematologic/Lymphatic: Patient denies bleeding tendencies Endocrine: Patient denies heat/cold intolerance   GU: As per HPI.   OBJECTIVE    Vitals:    01/05/24 1435  BP: (!) 146/79  Pulse: 83    There is no height or weight on file to calculate BMI.   Physical Examination Constitutional: No obvious distress; patient is non-toxic appearing  Cardiovascular: No visible lower extremity edema.  Respiratory: The patient does not have audible wheezing/stridor; respirations do not appear labored  Gastrointestinal: Abdomen non-distended Musculoskeletal: Normal ROM of UEs  Skin: No obvious rashes/open sores  Neurologic: CN 2-12 grossly intact Psychiatric: Answered questions appropriately with normal affect  Hematologic/Lymphatic/Immunologic: No obvious bruises or sites of spontaneous bleeding   Voided Urine microscopy: >30 WBC/hpf, 3-10 RBC/hpf, moderate bacteria   I&O cathed Urine microscopy: 11-30 WBC/hpf, 0-2 RBC/hpf, moderate bacteria   PVR: 0 ml   ASSESSMENT Recurrent UTI - Plan: Urinalysis, Routine w reflex microscopic, BLADDER SCAN AMB NON-IMAGING, Ambulatory Referral For Surgery Scheduling, In and Out Cath, Urinalysis, Routine w reflex microscopic, Urine Culture, amoxicillin-clavulanate (AUGMENTIN) 875-125 MG tablet   OAB (overactive bladder) - Plan: Urinalysis, Routine w reflex microscopic, BLADDER SCAN AMB NON-IMAGING   Mixed stress and urge incontinence - Plan: Urinalysis, Routine w reflex microscopic, BLADDER SCAN AMB NON-IMAGING   Dysuria - Plan: Urine Culture,  amoxicillin-clavulanate (AUGMENTIN) 875-125 MG tablet   Abnormal urinalysis - Plan: amoxicillin-clavulanate (AUGMENTIN) 875-125 MG tablet   Abnormal UA from both voided and I&O cathed urine specimens. Will check urine culture (on I&O cathed urine specimen) and treat empirically with Augmentin while awaiting culture results and sensitivities.    For her refractory OAB with urge incontinence we reviewed treatment options. She elected to continue Myrbetriq (Mirabegron) 50 mg daily and to proceed with cystoscopy with bladder Botox; surgery request submitted.    For UTI prevention will plan to continue Trimethoprim 100 mg daily, topical vaginal estrogen cream every other night, and daily cranberry supplement.   Will plan for follow up in 6 months or  sooner if needed. Pt verbalized understanding and agreement. All questions were answered.   PLAN Advised the following: 1. Urine culture. 2. Start Augmentin 875-125 mg 2x/day for 7 days. 3. Hold Trimethoprim 100 mg daily while taking Augmentin then restart once Augmentin course is complete restart Trimethoprim 100 mg daily for UTI prophylaxis. 4. Continue Myrbetriq (Mirabegron) 50 mg daily. 5. Continue topical vaginal estrogen cream every other night. 6. Continue cranberry supplement. 7. Return for bladder Botox surgery with Dr. Ronne Binning

## 2024-02-16 NOTE — Op Note (Signed)
 Preoperative diagnosis: Overactive bladder  Postoperative diagnosis: same  Procedure: 1 cystoscopy 2. Intravesical botox injection 100 units  Attending: Wilkie Aye  Anesthesia: General  Estimated blood loss: Minimal  Drains: none  Specimens: none  Antibiotics: ancef  Findings:  Ureteral orifices in normal anatomic location.  No masses/lesions in the bladder  Indications: Patient is a 63 year old female with a history of overactive bladder and urinary leakage refractory to anticholinergic therapy.  After discussing treatment options, they decided proceed with intravesical botox injection.  Procedure in detail: The patient was brought to the operating room and a brief timeout was done to ensure correct patient, correct procedure, correct site.  General anesthesia was administered patient was placed in dorsal lithotomy position.  Their genitalia was then prepped and draped in usual sterile fashion.  A rigid 22 French cystoscope was passed in the urethra and the bladder.  Bladder was inspected and we noted no masses or lesions.  the ureteral orifices were in the normal orthotopic locations. Using a deflux injection needle we proceed to inject 100 units in a grid pattern between the ureteral orifices starting at the trigone to the mid posterior wall. We injected a total of 20 sites with 5 units per injection. The bladder was then drained and this concluded the procedure which was well tolerated by patient.  Complications: None  Condition: Stable, extubated, transferred to PACU  Plan: Patient is to be discharge home. They will followup in 1 month

## 2024-02-16 NOTE — Anesthesia Preprocedure Evaluation (Signed)
 Anesthesia Evaluation  Patient identified by MRN, date of birth, ID band Patient awake    Reviewed: Allergy & Precautions, H&P , NPO status , Patient's Chart, lab work & pertinent test results, reviewed documented beta blocker date and time   History of Anesthesia Complications (+) PONV and history of anesthetic complications  Airway Mallampati: II  TM Distance: >3 FB Neck ROM: full    Dental no notable dental hx.    Pulmonary shortness of breath, asthma , sleep apnea , pneumonia, COPD   Pulmonary exam normal breath sounds clear to auscultation       Cardiovascular Exercise Tolerance: Good hypertension, +CHF   Rhythm:regular Rate:Normal     Neuro/Psych  PSYCHIATRIC DISORDERS Anxiety Depression     Neuromuscular disease    GI/Hepatic Neg liver ROS,GERD  ,,  Endo/Other  diabetesHypothyroidism  Class 4 obesity  Renal/GU Renal disease  negative genitourinary   Musculoskeletal   Abdominal   Peds  Hematology  (+) Blood dyscrasia, anemia   Anesthesia Other Findings   Reproductive/Obstetrics negative OB ROS                             Anesthesia Physical Anesthesia Plan  ASA: 2  Anesthesia Plan: General and General LMA   Post-op Pain Management:    Induction:   PONV Risk Score and Plan: Ondansetron  Airway Management Planned:   Additional Equipment:   Intra-op Plan:   Post-operative Plan:   Informed Consent: I have reviewed the patients History and Physical, chart, labs and discussed the procedure including the risks, benefits and alternatives for the proposed anesthesia with the patient or authorized representative who has indicated his/her understanding and acceptance.     Dental Advisory Given  Plan Discussed with: CRNA  Anesthesia Plan Comments:        Anesthesia Quick Evaluation

## 2024-02-16 NOTE — Anesthesia Procedure Notes (Signed)
 Procedure Name: LMA Insertion Date/Time: 02/16/2024 8:00 AM  Performed by: Izola Price., CRNAPre-anesthesia Checklist: Patient identified, Emergency Drugs available, Suction available and Patient being monitored Patient Re-evaluated:Patient Re-evaluated prior to induction Oxygen Delivery Method: Circle System Utilized Preoxygenation: Pre-oxygenation with 100% oxygen Induction Type: IV induction LMA: LMA with gastric port inserted LMA Size: 4.0 Number of attempts: 1 Airway Equipment and Method: Bite block Placement Confirmation: positive ETCO2 Tube secured with: Tape Dental Injury: Teeth and Oropharynx as per pre-operative assessment

## 2024-02-16 NOTE — Discharge Instructions (Signed)

## 2024-02-17 ENCOUNTER — Other Ambulatory Visit (HOSPITAL_COMMUNITY)
Admission: RE | Admit: 2024-02-17 | Discharge: 2024-02-17 | Disposition: A | Discharge status: Home / Self Care | Source: Ambulatory Visit | Attending: Obstetrics & Gynecology | Admitting: Obstetrics & Gynecology

## 2024-02-17 ENCOUNTER — Encounter: Payer: Self-pay | Admitting: Obstetrics & Gynecology

## 2024-02-17 ENCOUNTER — Ambulatory Visit: Payer: Medicare HMO | Admitting: Obstetrics & Gynecology

## 2024-02-17 VITALS — BP 136/65 | HR 84

## 2024-02-17 DIAGNOSIS — Z1151 Encounter for screening for human papillomavirus (HPV): Secondary | ICD-10-CM | POA: Insufficient documentation

## 2024-02-17 DIAGNOSIS — J189 Pneumonia, unspecified organism: Secondary | ICD-10-CM | POA: Diagnosis not present

## 2024-02-17 DIAGNOSIS — Z01419 Encounter for gynecological examination (general) (routine) without abnormal findings: Secondary | ICD-10-CM

## 2024-02-17 DIAGNOSIS — Z0000 Encounter for general adult medical examination without abnormal findings: Secondary | ICD-10-CM

## 2024-02-17 DIAGNOSIS — M6281 Muscle weakness (generalized): Secondary | ICD-10-CM | POA: Diagnosis not present

## 2024-02-17 NOTE — Progress Notes (Signed)
 Chief Complaint  Patient presents with   Gynecologic Exam    Pt denies any bleeding. States she isn't sure why she is here.  Needs pap smear.      63 y.o. [id number] Patient's last menstrual period was 08/06/2012. The current method of family planning is tubal ligation.  Outpatient Encounter Medications as of 02/17/2024  Medication Sig   albuterol (VENTOLIN HFA) 108 (90 Base) MCG/ACT inhaler Inhale 2 puffs into the lungs every 6 (six) hours as needed for wheezing or shortness of breath.   alum & mag hydroxide-simeth (MAALOX/MYLANTA) 200-200-20 MG/5ML suspension Take 30 mLs by mouth every 2 (two) hours as needed for indigestion or heartburn. Do not exceed 4 doses in 24 hours   Amino Acids-Protein Hydrolys (FEEDING SUPPLEMENT, PRO-STAT 64,) LIQD Take 30 mLs by mouth daily.   amLODipine (NORVASC) 10 MG tablet Take 10 mg by mouth daily.   ascorbic acid (VITAMIN C) 500 MG tablet Take 500 mg by mouth 2 (two) times daily.   aspirin EC 81 MG tablet Take 81 mg by mouth daily. Swallow whole.   atorvastatin (LIPITOR) 10 MG tablet Take 10 mg by mouth at bedtime.   augmented betamethasone dipropionate (DIPROLENE-AF) 0.05 % cream Apply 1 application  topically 2 (two) times daily. Apply to trunk, buttocks, legs for Psoriasis   BD AUTOSHIELD DUO 30G X 5 MM MISC    Biotin 10 MG TABS Take 10 mg by mouth daily.   Cholecalciferol (VITAMIN D) 50 MCG (2000 UT) CAPS Take 2,000 Units by mouth daily.   Cranberry 300 MG tablet Take 300 mg by mouth 2 (two) times daily.   docusate sodium (COLACE) 100 MG capsule Take 100 mg by mouth daily.   estradiol (ESTRACE) 0.1 MG/GM vaginal cream Discard plastic applicator. Insert a blueberry size amount (approximately 1 gram) of cream on fingertip inside vagina at bedtime every night for 1 week then every other night routinely (for long term use).   fluticasone furoate-vilanterol (BREO ELLIPTA) 100-25 MCG/ACT AEPB Inhale 1 puff into the lungs daily.    fluticasone-salmeterol (ADVAIR) 250-50 MCG/ACT AEPB Inhale 1 puff into the lungs 2 (two) times daily.   furosemide (LASIX) 20 MG tablet Take 20 mg by mouth daily.   gabapentin (NEURONTIN) 300 MG capsule Take 1 capsule in AM, 1 capsule in PM   hydrALAZINE (APRESOLINE) 25 MG tablet Take 1 tablet (25 mg total) by mouth every 8 (eight) hours.   LANTUS 100 UNIT/ML injection Inject 0.7 mLs (70 Units total) into the skin at bedtime. (Patient taking differently: Inject 50 Units into the skin at bedtime.)   levothyroxine (SYNTHROID) 88 MCG tablet Take 88 mcg by mouth daily before breakfast.   Menthol-Zinc Oxide (CALMOSEPTINE) 0.44-20.6 % OINT Apply 1 Application topically daily as needed (preservation/protection after incontinent care).   Multiple Vitamins-Minerals (MULTIVITAMIN WITH MINERALS) tablet Take 1 tablet by mouth daily.   MYRBETRIQ 50 MG TB24 tablet Take 1 tablet (50 mg total) by mouth daily.   NOVOLOG 100 UNIT/ML injection Inject 14-20 Units into the skin 3 (three) times daily with meals. 90-150= 14 units; 151-200= 15 units; 201-250= 16 units; 251-300= 17 units; 301-350= 18 units; 351-400= 19 units; above 400 = 20 units (Patient taking differently: Inject 12 Units into the skin 3 (three) times daily with meals. Addition to sliding scale. Give if BS is above 90 and resident is eating If 151-200 = 1    201-250 = 2    251-300 = 3  301-350 = 4    351-400 = 5    401= 6 greater than 400 give 5 units (16 units total) and call endocrinology clinic [phone])   omeprazole (PRILOSEC) 20 MG capsule Take 20 mg by mouth daily.   ondansetron (ZOFRAN) 8 MG tablet Take 8 mg by mouth 2 (two) times daily.   OXYGEN Inhale 2 L into the lungs continuous.   potassium chloride SA (K-DUR) 20 MEQ tablet Take 20 mEq by mouth daily.   saccharomyces boulardii (FLORASTOR) 250 MG capsule Take 250 mg by mouth 2 (two) times daily.   sertraline (ZOLOFT) 100 MG tablet Take 100 mg by mouth daily.   trimethoprim (TRIMPEX)  100 MG tablet Take 1 tablet (100 mg total) by mouth daily.   cetirizine (ZYRTEC) 10 MG tablet Take 10 mg by mouth daily.   No facility-administered encounter medications on file as of 02/17/2024.    Subjective Pt is referred for GYN exam Recent CT scan was normal no gyn abnormalities Recurrent yeast Poorly controlled diabetes Past Medical History:  Diagnosis Date   Anemia    Arthritis    Asthma    Cancer of sigmoid (HCC)    COPD (chronic obstructive pulmonary disease) (HCC)    Depression    Diabetes mellitus    Diastolic dysfunction 05/15/2015   Dyspnea    Generalized weakness    Hyperlipidemia    Hypertension    Hypothyroidism    Obesity    Palpitations    Pneumonia    PONV (postoperative nausea and vomiting)    Prolonged QT interval 05/14/2015   Sleep apnea     Past Surgical History:  Procedure Laterality Date   BIOPSY  04/06/2022   Procedure: BIOPSY;  Surgeon: Dolores Frame, MD;  [facility]: [facility];  Service: Gastroenterology;;   CATARACT EXTRACTION     CESAREAN SECTION     CHOLECYSTECTOMY     COLONOSCOPY WITH PROPOFOL N/A 04/06/2022   Procedure: COLONOSCOPY WITH PROPOFOL;  Surgeon: Dolores Frame, MD;  [facility]: [facility];  Service: Gastroenterology;  Laterality: N/A;  805 ASA 2 patient in [facility]   ESOPHAGOGASTRODUODENOSCOPY (EGD) WITH PROPOFOL N/A 04/06/2022   Procedure: ESOPHAGOGASTRODUODENOSCOPY (EGD) WITH PROPOFOL;  Surgeon: Dolores Frame, MD;  [facility];  Service: Gastroenterology;  Laterality: N/A;   HEMOSTASIS CLIP PLACEMENT  04/06/2022   Procedure: HEMOSTASIS CLIP PLACEMENT;  Surgeon: Dolores Frame, MD;  [facility]: [facility];  Service: Gastroenterology;;   POLYPECTOMY  04/06/2022   Procedure: POLYPECTOMY;  Surgeon: Marguerita Merles, Reuel Boom, MD;  [facility]: [facility];  Service: Gastroenterology;;   SUBMUCOSAL TATTOO INJECTION  04/06/2022   Procedure: SUBMUCOSAL TATTOO  INJECTION;  Surgeon: Marguerita Merles, Reuel Boom, MD;  [facility]: [facility];  Service: Gastroenterology;;    OB History     Gravida  3   Para  2   Term  2   Preterm      AB  1   Living  2      SAB  1   IAB      Ectopic      Multiple      Live Births              Allergies  Allergen Reactions   Carvedilol Palpitations   Benicar [Olmesartan] Swelling   Codeine Other (See Comments)    Confusion    Sulfa Antibiotics Swelling    Whole face swells   Trulicity [Dulaglutide] Diarrhea   Ceftriaxone  Hives and Rash    Social History   Socioeconomic History   Marital status: Married    Spouse name: Not on file   Number of children: Not on file   Years of education: Not on file   Highest education level: Not on file  Occupational History   Not on file  Tobacco Use   Smoking status: Never    Passive exposure: Yes   Smokeless tobacco: Never  Vaping Use   Vaping status: Never Used  Substance and Sexual Activity   Alcohol use: No    Alcohol/week: 0.0 standard drinks of alcohol   Drug use: No   Sexual activity: Yes    Birth control/protection: None  Other Topics Concern   Not on file  Social History Narrative   Lives with husband.  One living son.  Daughter died after transplant age 27.     Are you right handed or left handed? Right   Are you currently employed ? 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    What type of home do you live in: 1 story or 2 story? one       Social Drivers of Corporate investment banker Strain: Medium Risk (08/14/2021)   Received from [facility], [facility]   Overall Financial Resource Strain (CARDIA)    Difficulty of Paying Living Expenses: Somewhat hard  Food Insecurity: No Food Insecurity (11/12/2023)   Hunger Vital Sign    Worried About Running Out of Food in the Last Year: Never true    Ran Out of Food in the Last Year: Never true  Transportation Needs: No  Transportation Needs (11/12/2023)   PRAPARE - Administrator, Civil Service (Medical): No    Lack of Transportation (Non-Medical): No  Physical Activity: Inactive (08/14/2021)   Received from [facility], [facility]   Exercise Vital Sign    Days of Exercise per Week: 0 days    Minutes of Exercise per Session: 0 min  Stress: Stress Concern Present (08/14/2021)   Received from [facility], [facility] - Occupational Stress Questionnaire    Feeling of Stress : Very much  Social Connections: Unknown (04/08/2022)   Received from [facility], [facility]   Social Network    Social Network: Not on file    Family History  Problem Relation Age of Onset   Stroke Mother    Diabetes Father    Heart failure Father    Hypertension Father    Diabetes Sister    Heart failure Sister    Hypertension Sister    Stroke Sister    Cancer Other    Stroke Sister     Medications:       Current Outpatient Medications:    albuterol (VENTOLIN HFA) 108 (90 Base) MCG/ACT inhaler, Inhale 2 puffs into the lungs every 6 (six) hours as needed for wheezing or shortness of breath., Disp: , Rfl:    alum & mag hydroxide-simeth (MAALOX/MYLANTA) 200-200-20 MG/5ML suspension, Take 30 mLs by mouth every 2 (two) hours as needed for indigestion or heartburn. Do not exceed 4 doses in 24 hours, Disp: , Rfl:    Amino Acids-Protein Hydrolys (FEEDING SUPPLEMENT, PRO-STAT 64,) LIQD, Take 30 mLs by mouth daily., Disp: , Rfl:    amLODipine (NORVASC) 10 MG tablet, Take 10 mg by mouth daily., Disp: , Rfl:    ascorbic  acid (VITAMIN C) 500 MG tablet, Take 500 mg by mouth 2 (two) times daily., Disp: , Rfl:    aspirin EC 81 MG tablet, Take 81 mg by mouth daily. Swallow whole., Disp: , Rfl:    atorvastatin (LIPITOR) 10 MG tablet, Take 10 mg by mouth at bedtime., Disp: , Rfl:    augmented betamethasone dipropionate (DIPROLENE-AF) 0.05 % cream, Apply 1 application  topically 2  (two) times daily. Apply to trunk, buttocks, legs for Psoriasis, Disp: , Rfl:    BD AUTOSHIELD DUO 30G X 5 MM MISC, , Disp: , Rfl:    Biotin 10 MG TABS, Take 10 mg by mouth daily., Disp: , Rfl:    Cholecalciferol (VITAMIN D) 50 MCG (2000 UT) CAPS, Take 2,000 Units by mouth daily., Disp: , Rfl:    Cranberry 300 MG tablet, Take 300 mg by mouth 2 (two) times daily., Disp: , Rfl:    docusate sodium (COLACE) 100 MG capsule, Take 100 mg by mouth daily., Disp: , Rfl:    estradiol (ESTRACE) 0.1 MG/GM vaginal cream, Discard plastic applicator. Insert a blueberry size amount (approximately 1 gram) of cream on fingertip inside vagina at bedtime every night for 1 week then every other night routinely (for long term use)., Disp: 30 g, Rfl: 3   fluticasone furoate-vilanterol (BREO ELLIPTA) 100-25 MCG/ACT AEPB, Inhale 1 puff into the lungs daily., Disp: 180 each, Rfl: 5   fluticasone-salmeterol (ADVAIR) 250-50 MCG/ACT AEPB, Inhale 1 puff into the lungs 2 (two) times daily., Disp: , Rfl:    furosemide (LASIX) 20 MG tablet, Take 20 mg by mouth daily., Disp: , Rfl:    gabapentin (NEURONTIN) 300 MG capsule, Take 1 capsule in AM, 1 capsule in PM, Disp: 180 capsule, Rfl: 3   hydrALAZINE (APRESOLINE) 25 MG tablet, Take 1 tablet (25 mg total) by mouth every 8 (eight) hours., Disp: , Rfl:    LANTUS 100 UNIT/ML injection, Inject 0.7 mLs (70 Units total) into the skin at bedtime. (Patient taking differently: Inject 50 Units into the skin at bedtime.), Disp: 60 mL, Rfl: 3   levothyroxine (SYNTHROID) 88 MCG tablet, Take 88 mcg by mouth daily before breakfast., Disp: , Rfl:    Menthol-Zinc Oxide (CALMOSEPTINE) 0.44-20.6 % OINT, Apply 1 Application topically daily as needed (preservation/protection after incontinent care)., Disp: , Rfl:    Multiple Vitamins-Minerals (MULTIVITAMIN WITH MINERALS) tablet, Take 1 tablet by mouth daily., Disp: , Rfl:    MYRBETRIQ 50 MG TB24 tablet, Take 1 tablet (50 mg total) by mouth daily., Disp: 30  tablet, Rfl: 11   NOVOLOG 100 UNIT/ML injection, Inject 14-20 Units into the skin 3 (three) times daily with meals. 90-150= 14 units; 151-200= 15 units; 201-250= 16 units; 251-300= 17 units; 301-350= 18 units; 351-400= 19 units; above 400 = 20 units (Patient taking differently: Inject 12 Units into the skin 3 (three) times daily with meals. Addition to sliding scale. Give if BS is above 90 and resident is eating If 151-200 = 1    201-250 = 2    251-300 = 3    301-350 = 4    351-400 = 5    401= 6 greater than 400 give 5 units (16 units total) and call endocrinology clinic [phone]), Disp: 60 mL, Rfl: 3   omeprazole (PRILOSEC) 20 MG capsule, Take 20 mg by mouth daily., Disp: , Rfl:    ondansetron (ZOFRAN) 8 MG tablet, Take 8 mg by mouth 2 (two) times daily., Disp: , Rfl:    OXYGEN,  Inhale 2 L into the lungs continuous., Disp: , Rfl:    potassium chloride SA (K-DUR) 20 MEQ tablet, Take 20 mEq by mouth daily., Disp: , Rfl:    saccharomyces boulardii (FLORASTOR) 250 MG capsule, Take 250 mg by mouth 2 (two) times daily., Disp: , Rfl:    sertraline (ZOLOFT) 100 MG tablet, Take 100 mg by mouth daily., Disp: , Rfl:    trimethoprim (TRIMPEX) 100 MG tablet, Take 1 tablet (100 mg total) by mouth daily., Disp: 30 tablet, Rfl: 11   cetirizine (ZYRTEC) 10 MG tablet, Take 10 mg by mouth daily., Disp: , Rfl:   Objective Blood pressure 136/65, pulse 84, last menstrual period 08/06/2012.  General WDWN female NAD Vulva:  normal appearing vulva with no masses, tenderness or lesions Vagina:  normal mucosa, no discharge Cervix:  Normal no lesions Uterus:  normal size, contour, position, consistency, mobility, non-tender Adnexa: ovaries:present,  normal adnexa in size, nontender and no masses   Pertinent ROS No burning with urination, frequency or urgency No nausea, vomiting or diarrhea Nor fever chills or other constitutional symptoms   Labs or studies     Impression + Management Plan: Diagnoses this  Encounter::   ICD-10-CM   1. Encounter for well woman exam with routine gynecological exam  Z01.419    stated had 1 time of blood in her pad unsure origin beofre Christmas, only the one time, recent CT scan did not show any abnormalities of the endometrium        Medications prescribed during  this encounter: No orders of the defined types were placed in this encounter.   Labs or Scans Ordered during this encounter: No orders of the defined types were placed in this encounter.     Follow up Return in about 1 month (around 03/19/2024), or if symptoms worsen or fail to improve, for GYN sono to evaluate the endometrium, Follow up, with Dr Despina Hidden.

## 2024-02-17 NOTE — Anesthesia Postprocedure Evaluation (Signed)
 Anesthesia Post Note  Patient: [name]  Procedure(s) Performed: CYSTOSCOPY WITH INJECTION- Bladder botox 100 units (Bladder)  Patient location during evaluation: Phase II Anesthesia Type: General Level of consciousness: awake Pain management: pain level controlled Vital Signs Assessment: post-procedure vital signs reviewed and stable Respiratory status: spontaneous breathing and respiratory function stable Cardiovascular status: blood pressure returned to baseline and stable Postop Assessment: no headache and no apparent nausea or vomiting Anesthetic complications: no Comments: Late entry   No notable events documented.   Last Vitals:  Vitals:   02/16/24 0845 02/16/24 0850  BP: 133/75 (!) 142/52  Pulse: 68 71  Resp: 11 18  Temp:  (!) 35.8 C  SpO2: 98% 96%    Last Pain:  Vitals:   02/17/24 1435  TempSrc:   PainSc: 0-No pain                 Windell Norfolk

## 2024-02-20 DIAGNOSIS — M6281 Muscle weakness (generalized): Secondary | ICD-10-CM | POA: Diagnosis not present

## 2024-02-20 DIAGNOSIS — J189 Pneumonia, unspecified organism: Secondary | ICD-10-CM | POA: Diagnosis not present

## 2024-02-21 DIAGNOSIS — J189 Pneumonia, unspecified organism: Secondary | ICD-10-CM | POA: Diagnosis not present

## 2024-02-21 DIAGNOSIS — M6281 Muscle weakness (generalized): Secondary | ICD-10-CM | POA: Diagnosis not present

## 2024-02-21 LAB — CYTOLOGY - PAP
Comment: NEGATIVE
Diagnosis: NEGATIVE
High risk HPV: NEGATIVE

## 2024-02-22 DIAGNOSIS — J189 Pneumonia, unspecified organism: Secondary | ICD-10-CM | POA: Diagnosis not present

## 2024-02-22 DIAGNOSIS — M6281 Muscle weakness (generalized): Secondary | ICD-10-CM | POA: Diagnosis not present

## 2024-02-23 DIAGNOSIS — M6281 Muscle weakness (generalized): Secondary | ICD-10-CM | POA: Diagnosis not present

## 2024-02-23 DIAGNOSIS — J189 Pneumonia, unspecified organism: Secondary | ICD-10-CM | POA: Diagnosis not present

## 2024-02-24 DIAGNOSIS — J189 Pneumonia, unspecified organism: Secondary | ICD-10-CM | POA: Diagnosis not present

## 2024-02-24 DIAGNOSIS — M6281 Muscle weakness (generalized): Secondary | ICD-10-CM | POA: Diagnosis not present

## 2024-02-27 ENCOUNTER — Encounter (HOSPITAL_BASED_OUTPATIENT_CLINIC_OR_DEPARTMENT_OTHER): Payer: Medicare HMO | Admitting: General Surgery

## 2024-02-27 DIAGNOSIS — I5032 Chronic diastolic (congestive) heart failure: Secondary | ICD-10-CM | POA: Diagnosis not present

## 2024-02-27 DIAGNOSIS — E1065 Type 1 diabetes mellitus with hyperglycemia: Secondary | ICD-10-CM | POA: Diagnosis not present

## 2024-02-27 DIAGNOSIS — E11621 Type 2 diabetes mellitus with foot ulcer: Secondary | ICD-10-CM | POA: Diagnosis not present

## 2024-02-27 DIAGNOSIS — E10621 Type 1 diabetes mellitus with foot ulcer: Secondary | ICD-10-CM | POA: Diagnosis not present

## 2024-02-27 DIAGNOSIS — J189 Pneumonia, unspecified organism: Secondary | ICD-10-CM | POA: Diagnosis not present

## 2024-02-27 DIAGNOSIS — M6281 Muscle weakness (generalized): Secondary | ICD-10-CM | POA: Diagnosis not present

## 2024-02-27 DIAGNOSIS — L97412 Non-pressure chronic ulcer of right heel and midfoot with fat layer exposed: Secondary | ICD-10-CM | POA: Diagnosis not present

## 2024-02-28 DIAGNOSIS — J189 Pneumonia, unspecified organism: Secondary | ICD-10-CM | POA: Diagnosis not present

## 2024-02-28 DIAGNOSIS — M6281 Muscle weakness (generalized): Secondary | ICD-10-CM | POA: Diagnosis not present

## 2024-02-29 ENCOUNTER — Encounter: Payer: Medicare HMO | Admitting: Urology

## 2024-02-29 DIAGNOSIS — J189 Pneumonia, unspecified organism: Secondary | ICD-10-CM | POA: Diagnosis not present

## 2024-02-29 DIAGNOSIS — M6281 Muscle weakness (generalized): Secondary | ICD-10-CM | POA: Diagnosis not present

## 2024-02-29 NOTE — Progress Notes (Unsigned)
 Name: [name] DOB: 1961/04/26 MRN: [id number]  Diagnoses: Post-operative state  HPI: [name] presents post-operatively.  GU / GYN History includes: 1. Recurrent UTIs. - Risk factors include: T2DM. - Taking Trimethoprim 100 mg daily for UTI prophylaxis. - Uses topical vaginal estrogen cream every other night. 2. OAB with urinary frequency, nocturia, urgency, and urge incontinence.  - Likely exacerbated by Lasix use. - Taking Myrbetriq 50 mg daily.  3. Stress urinary incontinence. 4. Uterine prolapse. 5. Right renal cyst. - 06/07/2023: RUS showed a exophytic 2.4 cm right renal cyst; no GU stones, masses, or hydronephrosis.  Today She presents s/p the following procedures by Dr. Ronne Binning on 02/16/2024:  Preoperative diagnosis: Overactive bladder   Postoperative diagnosis: same   Procedure:  1 cystoscopy 2. Intravesical botox injection 100 units  Postop course: Today She reports that her urinary symptoms have improved since the procedure. She reports decreased urinary urgency, frequency, nocturia, and urge incontinence. She is now sleeping through the night and reports that she isn't needing to change her pad/pull-up as often. She has continued to work on timed voiding and to take Myrbetriq (Mirabegron) 50 mg daily.   Denies dysuria, gross hematuria, hesitancy, straining to void, or sensations of incomplete emptying.   Medications: Current Outpatient Medications  Medication Sig Dispense Refill   albuterol (VENTOLIN HFA) 108 (90 Base) MCG/ACT inhaler Inhale 2 puffs into the lungs every 6 (six) hours as needed for wheezing or shortness of breath.     alum & mag hydroxide-simeth (MAALOX/MYLANTA) 200-200-20 MG/5ML suspension Take 30 mLs by mouth every 2 (two) hours as needed for indigestion or heartburn. Do not exceed 4 doses in 24 hours     Amino Acids-Protein Hydrolys (FEEDING SUPPLEMENT, PRO-STAT 64,) LIQD Take 30 mLs by mouth daily.     amLODipine  (NORVASC) 10 MG tablet Take 10 mg by mouth daily.     ascorbic acid (VITAMIN C) 500 MG tablet Take 500 mg by mouth 2 (two) times daily.     aspirin EC 81 MG tablet Take 81 mg by mouth daily. Swallow whole.     atorvastatin (LIPITOR) 10 MG tablet Take 10 mg by mouth at bedtime.     augmented betamethasone dipropionate (DIPROLENE-AF) 0.05 % cream Apply 1 application  topically 2 (two) times daily. Apply to trunk, buttocks, legs for Psoriasis     BD AUTOSHIELD DUO 30G X 5 MM MISC      Biotin 10 MG TABS Take 10 mg by mouth daily.     cetirizine (ZYRTEC) 10 MG tablet Take 10 mg by mouth daily.     Cholecalciferol (VITAMIN D) 50 MCG (2000 UT) CAPS Take 2,000 Units by mouth daily.     Cranberry 300 MG tablet Take 300 mg by mouth 2 (two) times daily.     docusate sodium (COLACE) 100 MG capsule Take 100 mg by mouth daily.     estradiol (ESTRACE) 0.1 MG/GM vaginal cream Discard plastic applicator. Insert a blueberry size amount (approximately 1 gram) of cream on fingertip inside vagina at bedtime every night for 1 week then every other night routinely (for long term use). 30 g 3   fluticasone furoate-vilanterol (BREO ELLIPTA) 100-25 MCG/ACT AEPB Inhale 1 puff into the lungs daily. 180 each 5   fluticasone-salmeterol (ADVAIR) 250-50 MCG/ACT AEPB Inhale 1 puff into the lungs 2 (two) times daily.     furosemide (LASIX) 20 MG tablet Take 20 mg by mouth daily.     gabapentin (NEURONTIN) 300 MG  capsule Take 1 capsule in AM, 1 capsule in PM 180 capsule 3   hydrALAZINE (APRESOLINE) 25 MG tablet Take 1 tablet (25 mg total) by mouth every 8 (eight) hours.     LANTUS 100 UNIT/ML injection Inject 0.7 mLs (70 Units total) into the skin at bedtime. (Patient taking differently: Inject 50 Units into the skin at bedtime.) 60 mL 3   levothyroxine (SYNTHROID) 88 MCG tablet Take 88 mcg by mouth daily before breakfast.     Menthol-Zinc Oxide (CALMOSEPTINE) 0.44-20.6 % OINT Apply 1 Application topically daily as needed  (preservation/protection after incontinent care).     Multiple Vitamins-Minerals (MULTIVITAMIN WITH MINERALS) tablet Take 1 tablet by mouth daily.     MYRBETRIQ 50 MG TB24 tablet Take 1 tablet (50 mg total) by mouth daily. 30 tablet 11   NOVOLOG 100 UNIT/ML injection Inject 14-20 Units into the skin 3 (three) times daily with meals. 90-150= 14 units; 151-200= 15 units; 201-250= 16 units; 251-300= 17 units; 301-350= 18 units; 351-400= 19 units; above 400 = 20 units (Patient taking differently: Inject 12 Units into the skin 3 (three) times daily with meals. Addition to sliding scale. Give if BS is above 90 and resident is eating If 151-200 = 1    201-250 = 2    251-300 = 3    301-350 = 4    351-400 = 5    401= 6 greater than 400 give 5 units (16 units total) and call endocrinology clinic [phone]) 60 mL 3   omeprazole (PRILOSEC) 20 MG capsule Take 20 mg by mouth daily.     ondansetron (ZOFRAN) 8 MG tablet Take 8 mg by mouth 2 (two) times daily.     OXYGEN Inhale 2 L into the lungs continuous.     potassium chloride SA (K-DUR) 20 MEQ tablet Take 20 mEq by mouth daily.     saccharomyces boulardii (FLORASTOR) 250 MG capsule Take 250 mg by mouth 2 (two) times daily.     sertraline (ZOLOFT) 100 MG tablet Take 100 mg by mouth daily.     trimethoprim (TRIMPEX) 100 MG tablet Take 1 tablet (100 mg total) by mouth daily. 30 tablet 11   No current facility-administered medications for this visit.    Allergies: Allergies  Allergen Reactions   Carvedilol Palpitations   Benicar [Olmesartan] Swelling   Codeine Other (See Comments)    Confusion    Sulfa Antibiotics Swelling    Whole face swells   Trulicity [Dulaglutide] Diarrhea   Ceftriaxone Hives and Rash    Past Medical History:  Diagnosis Date   Anemia    Arthritis    Asthma    Cancer of sigmoid (HCC)    COPD (chronic obstructive pulmonary disease) (HCC)    Depression    Diabetes mellitus    Diastolic dysfunction 05/15/2015    Dyspnea    Generalized weakness    Hyperlipidemia    Hypertension    Hypothyroidism    Obesity    Palpitations    Pneumonia    PONV (postoperative nausea and vomiting)    Prolonged QT interval 05/14/2015   Sleep apnea    Past Surgical History:  Procedure Laterality Date   BIOPSY  04/06/2022   Procedure: BIOPSY;  Surgeon: Dolores Frame, MD;  [facility];  Service: Gastroenterology;;   CATARACT EXTRACTION     CESAREAN SECTION     CHOLECYSTECTOMY     COLONOSCOPY WITH PROPOFOL N/A 04/06/2022   Procedure: COLONOSCOPY WITH PROPOFOL;  Surgeon: Marguerita Merles, Reuel Boom, MD;  [facility];  Service: Gastroenterology;  Laterality: N/A;  805 ASA 2 patient in [facility]   CYSTOSCOPY WITH INJECTION N/A 02/16/2024   Procedure: CYSTOSCOPY WITH INJECTION- Bladder botox 100 units;  Surgeon: Malen Gauze, MD;  [facility]: [facility];  Service: Urology;  Laterality: N/A;   ESOPHAGOGASTRODUODENOSCOPY (EGD) WITH PROPOFOL N/A 04/06/2022   Procedure: ESOPHAGOGASTRODUODENOSCOPY (EGD) WITH PROPOFOL;  Surgeon: Dolores Frame, MD;  [facility];  Service: Gastroenterology;  Laterality: N/A;   HEMOSTASIS CLIP PLACEMENT  04/06/2022   Procedure: HEMOSTASIS CLIP PLACEMENT;  Surgeon: Dolores Frame, MD;  [facility];  Service: Gastroenterology;;   POLYPECTOMY  04/06/2022   Procedure: POLYPECTOMY;  Surgeon: Marguerita Merles, Reuel Boom, MD;  [facility];  Service: Gastroenterology;;   SUBMUCOSAL TATTOO INJECTION  04/06/2022   Procedure: SUBMUCOSAL TATTOO INJECTION;  Surgeon: Marguerita Merles, Reuel Boom, MD;  [facility];  Service: Gastroenterology;;   Family History  Problem Relation Age of Onset   Stroke Mother    Diabetes Father    Heart failure Father    Hypertension Father    Diabetes Sister    Heart failure Sister    Hypertension Sister    Stroke Sister    Cancer Other    Stroke Sister     Social History   Socioeconomic History   Marital status: Married    Spouse name: Not on file   Number of children: Not on file   Years of education: Not on file   Highest education level: Not on file  Occupational History   Not on file  Tobacco Use   Smoking status: Never    Passive exposure: Yes   Smokeless tobacco: Never  Vaping Use   Vaping status: Never Used  Substance and Sexual Activity   Alcohol use: No    Alcohol/week: 0.0 standard drinks of alcohol   Drug use: No   Sexual activity: Yes    Birth control/protection: None  Other Topics Concern   Not on file  Social History Narrative   Lives with husband.  One living son.  Daughter died after transplant age 72.     Are you right handed or left handed? Right   Are you currently employed ? 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    What type of home do you live in: 1 story or 2 story? one       Social Drivers of Corporate investment banker Strain: Medium Risk (08/14/2021)   Received from [facility], [facility]   Overall Financial Resource Strain (CARDIA)    Difficulty of Paying Living Expenses: Somewhat hard  Food Insecurity: No Food Insecurity (11/12/2023)   Hunger Vital Sign    Worried About Running Out of Food in the Last Year: Never true    Ran Out of Food in the Last Year: Never true  Transportation Needs: No Transportation Needs (11/12/2023)   PRAPARE - Administrator, Civil Service (Medical): No    Lack of Transportation (Non-Medical): No  Physical Activity: Inactive (08/14/2021)   Received from [facility], [facility]   Exercise Vital Sign    Days of Exercise per Week: 0 days    Minutes of Exercise per Session: 0 min  Stress: Stress Concern Present (08/14/2021)   Received from [facility], [facility] of  Occupational Health - Occupational Stress Questionnaire    Feeling of Stress : Very much  Social  Connections: Unknown (04/08/2022)   Received from [facility], [facility]   Social Network    Social Network: Not on file  Intimate Partner Violence: Not At Risk (11/12/2023)   Humiliation, Afraid, Rape, and Kick questionnaire    Fear of Current or Ex-Partner: No    Emotionally Abused: No    Physically Abused: No    Sexually Abused: No    SUBJECTIVE  Review of Systems Constitutional: Patient denies any unintentional weight loss or change in strength lntegumentary: Patient denies any rashes or pruritus Cardiovascular: Patient denies chest pain or syncope Respiratory: Patient denies shortness of breath Gastrointestinal: Patient denies nausea/vomiting Musculoskeletal: Patient denies muscle cramps or weakness Neurologic: Patient denies convulsions or seizures Allergic/Immunologic: Patient denies recent allergic reaction(s) Hematologic/Lymphatic: Patient denies bleeding tendencies Endocrine: Patient denies heat/cold intolerance  GU: As per HPI.  OBJECTIVE Vitals:   03/01/24 1420  BP: (!) 147/68  Pulse: 80  Temp: 98.1 F (36.7 C)   There is no height or weight on file to calculate BMI.  Physical Examination Constitutional: No obvious distress; patient is non-toxic appearing  Cardiovascular: No visible lower extremity edema.  Respiratory: The patient does not have audible wheezing/stridor; respirations do not appear labored  Gastrointestinal: Abdomen non-distended Musculoskeletal: Normal ROM of UEs  Skin: No obvious rashes/open sores  Neurologic: CN 2-12 grossly intact Psychiatric: Answered questions appropriately with normal affect  Hematologic/Lymphatic/Immunologic: No obvious bruises or sites of spontaneous bleeding  Urine microscopy: 5-10 WBC/hpf, 0-2 RBC/hpf, few bacteria PVR: 2 ml  ASSESSMENT Overactive bladder - Plan: Urinalysis, Routine w reflex microscopic, BLADDER SCAN AMB NON-IMAGING  Postop check  We reviewed the operative procedures and findings.  Pre-operative symptoms are improved since the procedure. Normal PVR today; no evidence of postop urinary retention / incomplete bladder emptying.   We agreed to plan for follow up in 6 months or sooner if needed. Patient verbalized understanding of and agreement with current plan. All questions were answered.  PLAN Advised the following: Continue Trimethoprim 100 mg daily for UTI prophylaxis. Continue application of topical vaginal estrogen cream every other night. Continue Myrbetriq 50 mg daily.  Continue timed voiding Continue to minimize / avoid caffeine intake. Return in about 6 months (around 09/01/2024) for UA, PVR, & f/u with Evette Georges NP.  Orders Placed This Encounter  Procedures   Urinalysis, Routine w reflex microscopic   BLADDER SCAN AMB NON-IMAGING    It has been explained that the patient is to follow regularly with their PCP in addition to all other providers involved in their care and to follow instructions provided by these respective offices. Patient advised to contact urology clinic if any urologic-pertaining questions, concerns, new symptoms or problems arise in the interim period.  There are no Patient Instructions on file for this visit.  Electronically signed by:  Donnita Falls, MSN, FNP-C, CUNP 03/01/2024 3:01 PM

## 2024-03-01 ENCOUNTER — Ambulatory Visit (INDEPENDENT_AMBULATORY_CARE_PROVIDER_SITE_OTHER): Payer: Medicare HMO | Admitting: Urology

## 2024-03-01 ENCOUNTER — Encounter: Payer: Self-pay | Admitting: Urology

## 2024-03-01 VITALS — BP 147/68 | HR 80 | Temp 98.1°F

## 2024-03-01 DIAGNOSIS — Z09 Encounter for follow-up examination after completed treatment for conditions other than malignant neoplasm: Secondary | ICD-10-CM

## 2024-03-01 DIAGNOSIS — N3281 Overactive bladder: Secondary | ICD-10-CM | POA: Diagnosis not present

## 2024-03-01 LAB — URINALYSIS, ROUTINE W REFLEX MICROSCOPIC
Bilirubin, UA: NEGATIVE
Glucose, UA: NEGATIVE
Ketones, UA: NEGATIVE
Leukocytes,UA: NEGATIVE
Nitrite, UA: NEGATIVE
RBC, UA: NEGATIVE
Specific Gravity, UA: 1.02 (ref 1.005–1.030)
Urobilinogen, Ur: 0.2 mg/dL (ref 0.2–1.0)
pH, UA: 6 (ref 5.0–7.5)

## 2024-03-01 LAB — MICROSCOPIC EXAMINATION

## 2024-03-01 LAB — BLADDER SCAN AMB NON-IMAGING: Scan Result: 2

## 2024-03-01 NOTE — Progress Notes (Signed)
 post void residual=2

## 2024-03-02 DIAGNOSIS — M6281 Muscle weakness (generalized): Secondary | ICD-10-CM | POA: Diagnosis not present

## 2024-03-02 DIAGNOSIS — J189 Pneumonia, unspecified organism: Secondary | ICD-10-CM | POA: Diagnosis not present

## 2024-03-05 DIAGNOSIS — M6281 Muscle weakness (generalized): Secondary | ICD-10-CM | POA: Diagnosis not present

## 2024-03-05 DIAGNOSIS — J189 Pneumonia, unspecified organism: Secondary | ICD-10-CM | POA: Diagnosis not present

## 2024-03-06 DIAGNOSIS — E11621 Type 2 diabetes mellitus with foot ulcer: Secondary | ICD-10-CM | POA: Diagnosis not present

## 2024-03-06 DIAGNOSIS — M6281 Muscle weakness (generalized): Secondary | ICD-10-CM | POA: Diagnosis not present

## 2024-03-06 DIAGNOSIS — J189 Pneumonia, unspecified organism: Secondary | ICD-10-CM | POA: Diagnosis not present

## 2024-03-07 DIAGNOSIS — E11621 Type 2 diabetes mellitus with foot ulcer: Secondary | ICD-10-CM | POA: Diagnosis not present

## 2024-03-07 DIAGNOSIS — E039 Hypothyroidism, unspecified: Secondary | ICD-10-CM | POA: Diagnosis not present

## 2024-03-07 DIAGNOSIS — E785 Hyperlipidemia, unspecified: Secondary | ICD-10-CM | POA: Diagnosis not present

## 2024-03-07 DIAGNOSIS — I5032 Chronic diastolic (congestive) heart failure: Secondary | ICD-10-CM | POA: Diagnosis not present

## 2024-03-07 DIAGNOSIS — L089 Local infection of the skin and subcutaneous tissue, unspecified: Secondary | ICD-10-CM | POA: Diagnosis not present

## 2024-03-07 DIAGNOSIS — N1832 Chronic kidney disease, stage 3b: Secondary | ICD-10-CM | POA: Diagnosis not present

## 2024-03-07 DIAGNOSIS — M6281 Muscle weakness (generalized): Secondary | ICD-10-CM | POA: Diagnosis not present

## 2024-03-07 DIAGNOSIS — J449 Chronic obstructive pulmonary disease, unspecified: Secondary | ICD-10-CM | POA: Diagnosis not present

## 2024-03-07 DIAGNOSIS — J302 Other seasonal allergic rhinitis: Secondary | ICD-10-CM | POA: Diagnosis not present

## 2024-03-07 DIAGNOSIS — L0390 Cellulitis, unspecified: Secondary | ICD-10-CM | POA: Diagnosis not present

## 2024-03-07 DIAGNOSIS — J189 Pneumonia, unspecified organism: Secondary | ICD-10-CM | POA: Diagnosis not present

## 2024-03-08 DIAGNOSIS — M6281 Muscle weakness (generalized): Secondary | ICD-10-CM | POA: Diagnosis not present

## 2024-03-08 DIAGNOSIS — M79604 Pain in right leg: Secondary | ICD-10-CM | POA: Diagnosis not present

## 2024-03-08 DIAGNOSIS — J189 Pneumonia, unspecified organism: Secondary | ICD-10-CM | POA: Diagnosis not present

## 2024-03-08 DIAGNOSIS — M79605 Pain in left leg: Secondary | ICD-10-CM | POA: Diagnosis not present

## 2024-03-08 LAB — HEMOGLOBIN A1C: A1c: 10.7

## 2024-03-09 DIAGNOSIS — J189 Pneumonia, unspecified organism: Secondary | ICD-10-CM | POA: Diagnosis not present

## 2024-03-09 DIAGNOSIS — M6281 Muscle weakness (generalized): Secondary | ICD-10-CM | POA: Diagnosis not present

## 2024-03-12 ENCOUNTER — Encounter (HOSPITAL_BASED_OUTPATIENT_CLINIC_OR_DEPARTMENT_OTHER): Attending: General Surgery | Admitting: General Surgery

## 2024-03-12 DIAGNOSIS — J449 Chronic obstructive pulmonary disease, unspecified: Secondary | ICD-10-CM | POA: Insufficient documentation

## 2024-03-12 DIAGNOSIS — L97412 Non-pressure chronic ulcer of right heel and midfoot with fat layer exposed: Secondary | ICD-10-CM | POA: Diagnosis not present

## 2024-03-12 DIAGNOSIS — I5032 Chronic diastolic (congestive) heart failure: Secondary | ICD-10-CM | POA: Diagnosis not present

## 2024-03-12 DIAGNOSIS — L97415 Non-pressure chronic ulcer of right heel and midfoot with muscle involvement without evidence of necrosis: Secondary | ICD-10-CM | POA: Diagnosis not present

## 2024-03-12 DIAGNOSIS — E11621 Type 2 diabetes mellitus with foot ulcer: Secondary | ICD-10-CM | POA: Insufficient documentation

## 2024-03-13 ENCOUNTER — Other Ambulatory Visit: Payer: Self-pay | Admitting: Obstetrics & Gynecology

## 2024-03-13 DIAGNOSIS — M6281 Muscle weakness (generalized): Secondary | ICD-10-CM | POA: Diagnosis not present

## 2024-03-13 DIAGNOSIS — J189 Pneumonia, unspecified organism: Secondary | ICD-10-CM | POA: Diagnosis not present

## 2024-03-13 DIAGNOSIS — L400 Psoriasis vulgaris: Secondary | ICD-10-CM | POA: Diagnosis not present

## 2024-03-13 DIAGNOSIS — N950 Postmenopausal bleeding: Secondary | ICD-10-CM

## 2024-03-14 DIAGNOSIS — M6281 Muscle weakness (generalized): Secondary | ICD-10-CM | POA: Diagnosis not present

## 2024-03-14 DIAGNOSIS — J189 Pneumonia, unspecified organism: Secondary | ICD-10-CM | POA: Diagnosis not present

## 2024-03-15 DIAGNOSIS — J189 Pneumonia, unspecified organism: Secondary | ICD-10-CM | POA: Diagnosis not present

## 2024-03-15 DIAGNOSIS — M6281 Muscle weakness (generalized): Secondary | ICD-10-CM | POA: Diagnosis not present

## 2024-03-19 ENCOUNTER — Ambulatory Visit: Admitting: Radiology

## 2024-03-19 ENCOUNTER — Ambulatory Visit: Admitting: Obstetrics & Gynecology

## 2024-03-19 ENCOUNTER — Encounter: Payer: Self-pay | Admitting: Obstetrics & Gynecology

## 2024-03-19 VITALS — BP 135/77 | HR 77

## 2024-03-19 DIAGNOSIS — N950 Postmenopausal bleeding: Secondary | ICD-10-CM | POA: Diagnosis not present

## 2024-03-19 NOTE — Progress Notes (Signed)
 GYN US :  TA and TV imaging performed - Chaperone: Keila - vinyl probe cover used  Midplane uterus, normal in size without focal abnormalites Endometrial thickness = 5.6 mm  avascular cavity and canal.   No evidence of intracavitary defects Normal Rt ovary appears atrophic - Left ovary not seen - neg adnexal regions - neg CDS, no FF Any follow up imaging performed should be scheduled in room 1 with lowered hydraulic table do to logistics getting up onto table

## 2024-03-19 NOTE — Progress Notes (Signed)
 Follow up appointment for results: sonogram  Chief Complaint  Patient presents with   Follow-up    [place] today    Blood pressure 135/77, pulse 77, last menstrual period 08/06/2012.  US PELVIC COMPLETE WITH TRANSVAGINAL Result Date: 03/19/2024 Images from the original result were not included.  ..an Financial trader of Ultrasound Medicine Technical sales engineer) accredited practice Center for [facility] [address] [place] [address] Ordering Provider: Lazaro Arms, MD                                                                                                                                   GYNECOLOGIC SONOGRAM [name] is a 63 y.o. [id number] Patient's last menstrual period was 08/06/2012. for a pelvic sonogram for pelvic pain. Uterus                      4.5 x 3.0 x 3.5 cm, Total uterine volume 24.9 cc Endometrium          5.6 mm, symmetrical, no evidence of focal abnormalities, avascular cavity and canal Right ovary             2.1 x 1.4 x 1.7 cm, 2.8 Left ovary                not visualized Poor resolution due to body habitus Technician Comments: GYN [place]:  TA and TV imaging performed - Chaperone: Keila - vinyl probe cover used Midplane uterus, normal in size without focal abnormalites Endometrial thickness = 5.6 mm  avascular cavity and canal.  No evidence of intracavitary defects Normal Rt ovary appears atrophic - Left ovary not seen - neg adnexal regions - neg CDS, no FF Any follow up imaging performed should be scheduled in room 1 with lowered hydraulic table due to logistics getting up onto table Wylie Hail 03/19/2024 2:11 PM  Clinical Impression and recommendations: I have reviewed the sonogram results above, combined with the patient's current clinical course, below are my impressions and any appropriate recommendations for management based on the sonographic findings. Uterus normal size shape and contour Endometrium no abnormalities, normal in absence of PMB  Ovaries: very small consistent with menopausal state Lazaro Arms 03/19/2024 2:33 PM   No bleeding, had 1 spot of blood she is unsure of source before Christmas ES 5.6 mm normal nothing at all otherwise  MEDS ordered this encounter: No orders of the defined types were placed in this encounter.   Orders for this encounter: No orders of the defined types were placed in this encounter.   Impression + Management Plan   ICD-10-CM   1. PMB (postmenopausal bleeding): unlikely based on history but ES is unremarkable and EMBx not needed  N95.0       Follow Up: Return if symptoms worsen or fail to improve.     All questions were answered.  Past Medical History:  Diagnosis Date   Anemia    Arthritis    Asthma    Cancer of sigmoid (HCC)    COPD (chronic obstructive pulmonary disease) (HCC)    Depression    Diabetes mellitus    Diastolic dysfunction 05/15/2015   Dyspnea    Generalized weakness    Hyperlipidemia    Hypertension    Hypothyroidism    Obesity    Palpitations    Pneumonia    PONV (postoperative nausea and vomiting)    Prolonged QT interval 05/14/2015   Sleep apnea     Past Surgical History:  Procedure Laterality Date   BIOPSY  04/06/2022   Procedure: BIOPSY;  Surgeon: Dolores Frame, MD;  [facility];  Service: Gastroenterology;;   CATARACT EXTRACTION     CESAREAN SECTION     CHOLECYSTECTOMY     COLONOSCOPY WITH PROPOFOL N/A 04/06/2022   Procedure: COLONOSCOPY WITH PROPOFOL;  Surgeon: Dolores Frame, MD;  [facility];  Service: Gastroenterology;  Laterality: N/A;  805 ASA 2 patient in [facility]   CYSTOSCOPY WITH INJECTION N/A 02/16/2024   Procedure: CYSTOSCOPY WITH INJECTION- Bladder botox 100 units;  Surgeon: Malen Gauze, MD;  Location[facility];  Service: Urology;  Laterality: N/A;   ESOPHAGOGASTRODUODENOSCOPY (EGD) WITH PROPOFOL N/A 04/06/2022   Procedure: ESOPHAGOGASTRODUODENOSCOPY (EGD)  WITH PROPOFOL;  Surgeon: Dolores Frame, MD;  [facility];  Service: Gastroenterology;  Laterality: N/A;   HEMOSTASIS CLIP PLACEMENT  04/06/2022   Procedure: HEMOSTASIS CLIP PLACEMENT;  Surgeon: Dolores Frame, MD;  [facility];  Service: Gastroenterology;;   POLYPECTOMY  04/06/2022   Procedure: POLYPECTOMY;  Surgeon: Marguerita Merles, Reuel Boom, MD;  [facility];  Service: Gastroenterology;;   SUBMUCOSAL TATTOO INJECTION  04/06/2022   Procedure: SUBMUCOSAL TATTOO INJECTION;  Surgeon: Marguerita Merles, Reuel Boom, MD;  [facility];  Service: Gastroenterology;;    OB History     Gravida  3   Para  2   Term  2   Preterm      AB  1   Living  2      SAB  1   IAB      Ectopic      Multiple      Live Births              Allergies  Allergen Reactions   Carvedilol Palpitations   Benicar [Olmesartan] Swelling   Codeine Other (See Comments)    Confusion    Sulfa Antibiotics Swelling    Whole face swells   Trulicity [Dulaglutide] Diarrhea   Ceftriaxone Hives and Rash    Social History   Socioeconomic History   Marital status: Married    Spouse name: Not on file   Number of children: Not on file   Years of education: Not on file   Highest education level: Not on file  Occupational History   Not on file  Tobacco Use   Smoking status: Never    Passive exposure: Yes   Smokeless tobacco: Never  Vaping Use   Vaping status: Never Used  Substance and Sexual Activity   Alcohol use: No    Alcohol/week: 0.0 standard drinks of alcohol   Drug use: No   Sexual activity: Yes    Birth control/protection: None  Other Topics Concern   Not on file  Social History Narrative   Lives with husband.  One living son.  Daughter died after transplant  age 61.     Are you right handed or left handed? Right   Are you currently employed ? 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    What type of home do you live in: 1 story or 2 story? one       Social Drivers of Corporate investment banker Strain: Medium Risk (08/14/2021)   Received from [facility], [facility]   Overall Financial Resource Strain (CARDIA)    Difficulty of Paying Living Expenses: Somewhat hard  Food Insecurity: No Food Insecurity (11/12/2023)   Hunger Vital Sign    Worried About Running Out of Food in the Last Year: Never true    Ran Out of Food in the Last Year: Never true  Transportation Needs: No Transportation Needs (11/12/2023)   PRAPARE - Administrator, Civil Service (Medical): No    Lack of Transportation (Non-Medical): No  Physical Activity: Inactive (08/14/2021)   Received from [facility], [facility]   Exercise Vital Sign    Days of Exercise per Week: 0 days    Minutes of Exercise per Session: 0 min  Stress: Stress Concern Present (08/14/2021)   Received from [facility], [facility] of Occupational Health - Occupational Stress Questionnaire    Feeling of Stress : Very much  Social Connections: Unknown (04/08/2022)   Received from [facility], [facility]   Social Network    Social Network: Not on file    Family History  Problem Relation Age of Onset   Stroke Mother    Diabetes Father    Heart failure Father    Hypertension Father    Diabetes Sister    Heart failure Sister    Hypertension Sister    Stroke Sister    Cancer Other    Stroke Sister

## 2024-03-20 DIAGNOSIS — Z961 Presence of intraocular lens: Secondary | ICD-10-CM | POA: Diagnosis not present

## 2024-03-20 DIAGNOSIS — E103213 Type 1 diabetes mellitus with mild nonproliferative diabetic retinopathy with macular edema, bilateral: Secondary | ICD-10-CM | POA: Diagnosis not present

## 2024-03-26 ENCOUNTER — Encounter (HOSPITAL_BASED_OUTPATIENT_CLINIC_OR_DEPARTMENT_OTHER): Admitting: General Surgery

## 2024-03-26 DIAGNOSIS — I5032 Chronic diastolic (congestive) heart failure: Secondary | ICD-10-CM | POA: Diagnosis not present

## 2024-03-26 DIAGNOSIS — L97412 Non-pressure chronic ulcer of right heel and midfoot with fat layer exposed: Secondary | ICD-10-CM | POA: Diagnosis not present

## 2024-03-26 DIAGNOSIS — L97415 Non-pressure chronic ulcer of right heel and midfoot with muscle involvement without evidence of necrosis: Secondary | ICD-10-CM | POA: Diagnosis not present

## 2024-03-26 DIAGNOSIS — E11621 Type 2 diabetes mellitus with foot ulcer: Secondary | ICD-10-CM | POA: Diagnosis not present

## 2024-03-26 DIAGNOSIS — J449 Chronic obstructive pulmonary disease, unspecified: Secondary | ICD-10-CM | POA: Diagnosis not present

## 2024-03-29 DIAGNOSIS — K219 Gastro-esophageal reflux disease without esophagitis: Secondary | ICD-10-CM | POA: Diagnosis not present

## 2024-03-29 DIAGNOSIS — I5032 Chronic diastolic (congestive) heart failure: Secondary | ICD-10-CM | POA: Diagnosis not present

## 2024-03-29 DIAGNOSIS — Z7189 Other specified counseling: Secondary | ICD-10-CM | POA: Diagnosis not present

## 2024-03-29 DIAGNOSIS — E1165 Type 2 diabetes mellitus with hyperglycemia: Secondary | ICD-10-CM | POA: Diagnosis not present

## 2024-03-29 DIAGNOSIS — G5603 Carpal tunnel syndrome, bilateral upper limbs: Secondary | ICD-10-CM | POA: Diagnosis not present

## 2024-03-29 DIAGNOSIS — I10 Essential (primary) hypertension: Secondary | ICD-10-CM | POA: Diagnosis not present

## 2024-03-29 DIAGNOSIS — Z79899 Other long term (current) drug therapy: Secondary | ICD-10-CM | POA: Diagnosis not present

## 2024-04-02 DIAGNOSIS — Z79899 Other long term (current) drug therapy: Secondary | ICD-10-CM | POA: Diagnosis not present

## 2024-04-02 DIAGNOSIS — F32A Depression, unspecified: Secondary | ICD-10-CM | POA: Diagnosis not present

## 2024-04-03 DIAGNOSIS — I10 Essential (primary) hypertension: Secondary | ICD-10-CM | POA: Diagnosis not present

## 2024-04-03 DIAGNOSIS — N1832 Chronic kidney disease, stage 3b: Secondary | ICD-10-CM | POA: Diagnosis not present

## 2024-04-03 DIAGNOSIS — D631 Anemia in chronic kidney disease: Secondary | ICD-10-CM | POA: Diagnosis not present

## 2024-04-03 DIAGNOSIS — E1122 Type 2 diabetes mellitus with diabetic chronic kidney disease: Secondary | ICD-10-CM | POA: Diagnosis not present

## 2024-04-04 DIAGNOSIS — E1122 Type 2 diabetes mellitus with diabetic chronic kidney disease: Secondary | ICD-10-CM | POA: Diagnosis not present

## 2024-04-04 DIAGNOSIS — I10 Essential (primary) hypertension: Secondary | ICD-10-CM | POA: Diagnosis not present

## 2024-04-04 DIAGNOSIS — N1832 Chronic kidney disease, stage 3b: Secondary | ICD-10-CM | POA: Diagnosis not present

## 2024-04-06 DIAGNOSIS — Z961 Presence of intraocular lens: Secondary | ICD-10-CM | POA: Diagnosis not present

## 2024-04-06 DIAGNOSIS — E103513 Type 1 diabetes mellitus with proliferative diabetic retinopathy with macular edema, bilateral: Secondary | ICD-10-CM | POA: Diagnosis not present

## 2024-04-06 DIAGNOSIS — H35033 Hypertensive retinopathy, bilateral: Secondary | ICD-10-CM | POA: Diagnosis not present

## 2024-04-06 DIAGNOSIS — H43823 Vitreomacular adhesion, bilateral: Secondary | ICD-10-CM | POA: Diagnosis not present

## 2024-04-09 ENCOUNTER — Encounter (HOSPITAL_BASED_OUTPATIENT_CLINIC_OR_DEPARTMENT_OTHER): Attending: General Surgery | Admitting: General Surgery

## 2024-04-09 DIAGNOSIS — L97412 Non-pressure chronic ulcer of right heel and midfoot with fat layer exposed: Secondary | ICD-10-CM | POA: Diagnosis not present

## 2024-04-09 DIAGNOSIS — I5032 Chronic diastolic (congestive) heart failure: Secondary | ICD-10-CM | POA: Diagnosis not present

## 2024-04-09 DIAGNOSIS — J449 Chronic obstructive pulmonary disease, unspecified: Secondary | ICD-10-CM | POA: Diagnosis not present

## 2024-04-09 DIAGNOSIS — E1165 Type 2 diabetes mellitus with hyperglycemia: Secondary | ICD-10-CM | POA: Diagnosis not present

## 2024-04-09 DIAGNOSIS — E11621 Type 2 diabetes mellitus with foot ulcer: Secondary | ICD-10-CM | POA: Insufficient documentation

## 2024-04-09 DIAGNOSIS — Z6841 Body Mass Index (BMI) 40.0 and over, adult: Secondary | ICD-10-CM | POA: Diagnosis not present

## 2024-04-12 DIAGNOSIS — R3989 Other symptoms and signs involving the genitourinary system: Secondary | ICD-10-CM | POA: Diagnosis not present

## 2024-04-12 DIAGNOSIS — Z79899 Other long term (current) drug therapy: Secondary | ICD-10-CM | POA: Diagnosis not present

## 2024-04-12 DIAGNOSIS — R300 Dysuria: Secondary | ICD-10-CM | POA: Diagnosis not present

## 2024-04-16 ENCOUNTER — Ambulatory Visit (INDEPENDENT_AMBULATORY_CARE_PROVIDER_SITE_OTHER): Payer: Medicare HMO | Admitting: Nurse Practitioner

## 2024-04-16 ENCOUNTER — Encounter: Payer: Self-pay | Admitting: Nurse Practitioner

## 2024-04-16 VITALS — BP 130/80 | HR 65 | Ht 61.0 in | Wt 263.0 lb

## 2024-04-16 DIAGNOSIS — E782 Mixed hyperlipidemia: Secondary | ICD-10-CM | POA: Diagnosis not present

## 2024-04-16 DIAGNOSIS — I10 Essential (primary) hypertension: Secondary | ICD-10-CM | POA: Diagnosis not present

## 2024-04-16 DIAGNOSIS — E1165 Type 2 diabetes mellitus with hyperglycemia: Secondary | ICD-10-CM | POA: Diagnosis not present

## 2024-04-16 DIAGNOSIS — N390 Urinary tract infection, site not specified: Secondary | ICD-10-CM | POA: Diagnosis not present

## 2024-04-16 DIAGNOSIS — R600 Localized edema: Secondary | ICD-10-CM | POA: Diagnosis not present

## 2024-04-16 DIAGNOSIS — Z79899 Other long term (current) drug therapy: Secondary | ICD-10-CM | POA: Diagnosis not present

## 2024-04-16 DIAGNOSIS — Z794 Long term (current) use of insulin: Secondary | ICD-10-CM | POA: Diagnosis not present

## 2024-04-16 MED ORDER — LANTUS 100 UNIT/ML ~~LOC~~ SOLN: 70.0000 [IU] | Freq: Every day | SUBCUTANEOUS | 3 refills | Status: DC

## 2024-04-16 MED ORDER — NOVOLOG 100 UNIT/ML IJ SOLN
14.0000 [IU] | Freq: Three times a day (TID) | INTRAMUSCULAR | 3 refills | Status: DC
Start: 2024-04-16 — End: 2024-10-25

## 2024-04-16 NOTE — Progress Notes (Signed)
 Endocrinology Follow Up Note       04/16/2024, 11:40 AM   Subjective:    Patient ID: [name], female    DOB: 1961-04-17.  [name] is being seen in follow up after being seen in consultation for management of currently uncontrolled symptomatic diabetes requested by  Fonda Hymen, NP.   Past Medical History:  Diagnosis Date   Anemia    Arthritis    Asthma    Cancer of sigmoid (HCC)    COPD (chronic obstructive pulmonary disease) (HCC)    Depression    Diabetes mellitus    Diastolic dysfunction 05/15/2015   Dyspnea    Generalized weakness    Hyperlipidemia    Hypertension    Hypothyroidism    Obesity    Palpitations    Pneumonia    PONV (postoperative nausea and vomiting)    Prolonged QT interval 05/14/2015   Sleep apnea     Past Surgical History:  Procedure Laterality Date   BIOPSY  04/06/2022   Procedure: BIOPSY;  Surgeon: Urban Garden, MD;  [facility];  Service: Gastroenterology;;   CATARACT EXTRACTION     CESAREAN SECTION     CHOLECYSTECTOMY     COLONOSCOPY WITH PROPOFOL  N/A 04/06/2022   Procedure: COLONOSCOPY WITH PROPOFOL ;  Surgeon: Urban Garden, MD;  [facility]: [facility];  Service: Gastroenterology;  Laterality: N/A;  805 ASA 2 patient in [facility]   CYSTOSCOPY WITH INJECTION N/A 02/16/2024   Procedure: CYSTOSCOPY WITH INJECTION- Bladder botox  100 units;  Surgeon: Marco Severs, MD;  Location: [facility];  Service: Urology;  Laterality: N/A;   ESOPHAGOGASTRODUODENOSCOPY (EGD) WITH PROPOFOL  N/A 04/06/2022   Procedure: ESOPHAGOGASTRODUODENOSCOPY (EGD) WITH PROPOFOL ;  Surgeon: Urban Garden, MD;  [facility];  Service: Gastroenterology;  Laterality: N/A;   HEMOSTASIS CLIP PLACEMENT  04/06/2022   Procedure: HEMOSTASIS CLIP PLACEMENT;  Surgeon: Urban Garden, MD;  [facility];  Service: Gastroenterology;;   POLYPECTOMY  04/06/2022   Procedure: POLYPECTOMY;  Surgeon: Umberto Ganong, Bearl Limes, MD;  [facility];  Service: Gastroenterology;;   SUBMUCOSAL TATTOO INJECTION  04/06/2022   Procedure: SUBMUCOSAL TATTOO INJECTION;  Surgeon: Umberto Ganong, Bearl Limes, MD;  [facility];  Service: Gastroenterology;;    Social History   Socioeconomic History   Marital status: Married    Spouse name: Not on file   Number of children: Not on file   Years of education: Not on file   Highest education level: Not on file  Occupational History   Not on file  Tobacco Use   Smoking status: Never    Passive exposure: Yes   Smokeless tobacco: Never  Vaping Use   Vaping status: Never Used  Substance and Sexual Activity   Alcohol use: No    Alcohol/week: 0.0 standard drinks of alcohol   Drug use: No   Sexual activity: Yes    Birth control/protection: None  Other Topics Concern   Not on file  Social History Narrative   Lives with husband.  One living son.  Daughter died after transplant age 38.     Are you right  handed or left handed? Right   Are you currently employed ? 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    What type of home do you live in: 1 story or 2 story? one       Social Drivers of Corporate investment banker Strain: Medium Risk (08/14/2021)   Received from [facility], [facility]   Overall Financial Resource Strain (CARDIA)    Difficulty of Paying Living Expenses: Somewhat hard  Food Insecurity: No Food Insecurity (11/12/2023)   Hunger Vital Sign    Worried About Running Out of Food in the Last Year: Never true    Ran Out of Food in the Last Year: Never true  Transportation Needs: No Transportation Needs (11/12/2023)   PRAPARE - Administrator, Civil Service (Medical): No    Lack of Transportation (Non-Medical): No  Physical Activity: Inactive  (08/14/2021)   Received from [facility], [facility]   Exercise Vital Sign    Days of Exercise per Week: 0 days    Minutes of Exercise per Session: 0 min  Stress: Stress Concern Present (08/14/2021)   Received from [facility], [facility] - Occupational Stress Questionnaire    Feeling of Stress : Very much  Social Connections: Unknown (04/08/2022)   Received from [facility], [facility]   Social Network    Social Network: Not on file    Family History  Problem Relation Age of Onset   Stroke Mother    Diabetes Father    Heart failure Father    Hypertension Father    Diabetes Sister    Heart failure Sister    Hypertension Sister    Stroke Sister    Cancer Other    Stroke Sister     Outpatient Encounter Medications as of 04/16/2024  Medication Sig   albuterol  (VENTOLIN  HFA) 108 (90 Base) MCG/ACT inhaler Inhale 2 puffs into the lungs every 6 (six) hours as needed for wheezing or shortness of breath.   alum & mag hydroxide-simeth (MAALOX/MYLANTA) 200-200-20 MG/5ML suspension Take 30 mLs by mouth every 2 (two) hours as needed for indigestion or heartburn. Do not exceed 4 doses in 24 hours   Amino Acids-Protein Hydrolys (FEEDING SUPPLEMENT, PRO-STAT 64,) LIQD Take 30 mLs by mouth daily.   amLODipine  (NORVASC ) 10 MG tablet Take 10 mg by mouth daily.   ascorbic acid  (VITAMIN C ) 500 MG tablet Take 500 mg by mouth 2 (two) times daily.   aspirin  EC 81 MG tablet Take 81 mg by mouth daily. Swallow whole.   atorvastatin  (LIPITOR) 10 MG tablet Take 10 mg by mouth at bedtime.   augmented betamethasone dipropionate (DIPROLENE-AF) 0.05 % cream Apply 1 application  topically 2 (two) times daily. Apply to trunk, buttocks, legs for Psoriasis   BD AUTOSHIELD DUO 30G X 5 MM MISC    Biotin 10 MG TABS Take 10 mg by mouth daily.   cetirizine (ZYRTEC) 10 MG tablet Take 10 mg by mouth daily.   Cholecalciferol (VITAMIN D) 50 MCG (2000 UT) CAPS Take 2,000  Units by mouth daily.   Cranberry 300 MG tablet Take 300 mg by mouth 2 (two) times daily.   docusate sodium  (COLACE) 100 MG capsule Take 100 mg by mouth daily.   estradiol  (ESTRACE ) 0.1 MG/GM vaginal cream Discard plastic applicator. Insert a blueberry size amount (approximately 1 gram) of cream on fingertip  inside vagina at bedtime every night for 1 week then every other night routinely (for long term use).   fluticasone furoate-vilanterol (BREO ELLIPTA ) 100-25 MCG/ACT AEPB Inhale 1 puff into the lungs daily.   fluticasone-salmeterol (ADVAIR) 250-50 MCG/ACT AEPB Inhale 1 puff into the lungs 2 (two) times daily.   furosemide  (LASIX ) 20 MG tablet Take 20 mg by mouth daily.   gabapentin  (NEURONTIN ) 300 MG capsule Take 1 capsule in AM, 1 capsule in PM   hydrALAZINE  (APRESOLINE ) 25 MG tablet Take 1 tablet (25 mg total) by mouth every 8 (eight) hours.   levothyroxine  (SYNTHROID ) 88 MCG tablet Take 88 mcg by mouth daily before breakfast.   Menthol-Zinc  Oxide (CALMOSEPTINE) 0.44-20.6 % OINT Apply 1 Application topically daily as needed (preservation/protection after incontinent care).   Multiple Vitamins-Minerals (MULTIVITAMIN WITH MINERALS) tablet Take 1 tablet by mouth daily.   MYRBETRIQ  50 MG TB24 tablet Take 1 tablet (50 mg total) by mouth daily.   omeprazole (PRILOSEC) 20 MG capsule Take 20 mg by mouth daily.   ondansetron  (ZOFRAN ) 8 MG tablet Take 8 mg by mouth 2 (two) times daily.   OXYGEN  Inhale 2 L into the lungs continuous.   potassium chloride  SA (K-DUR) 20 MEQ tablet Take 20 mEq by mouth daily.   saccharomyces boulardii (FLORASTOR) 250 MG capsule Take 250 mg by mouth 2 (two) times daily.   sertraline  (ZOLOFT ) 100 MG tablet Take 100 mg by mouth daily.   trimethoprim  (TRIMPEX ) 100 MG tablet Take 1 tablet (100 mg total) by mouth daily.   [DISCONTINUED] LANTUS  100 UNIT/ML injection Inject 0.7 mLs (70 Units total) into the skin at bedtime. (Patient taking differently: Inject 50 Units into the  skin at bedtime.)   [DISCONTINUED] NOVOLOG  100 UNIT/ML injection Inject 14-20 Units into the skin 3 (three) times daily with meals. 90-150= 14 units; 151-200= 15 units; 201-250= 16 units; 251-300= 17 units; 301-350= 18 units; 351-400= 19 units; above 400 = 20 units (Patient taking differently: Inject 12 Units into the skin 3 (three) times daily with meals. Addition to sliding scale. Give if BS is above 90 and resident is eating If 151-200 = 1    201-250 = 2    251-300 = 3    301-350 = 4    351-400 = 5    401= 6 greater than 400 give 5 units (16 units total) and call endocrinology clinic [phone])   LANTUS  100 UNIT/ML injection Inject 0.7 mLs (70 Units total) into the skin at bedtime.   NOVOLOG  100 UNIT/ML injection Inject 14-20 Units into the skin 3 (three) times daily with meals. 90-150= 14 units; 151-200= 15 units; 201-250= 16 units; 251-300= 17 units; 301-350= 18 units; 351-400= 19 units; above 400 = 20 units   No facility-administered encounter medications on file as of 04/16/2024.    ALLERGIES: Allergies  Allergen Reactions   Carvedilol  Palpitations   Benicar [Olmesartan] Swelling   Codeine Other (See Comments)    Confusion    Sulfa Antibiotics Swelling    Whole face swells   Trulicity [Dulaglutide] Diarrhea   Ceftriaxone  Hives and Rash    VACCINATION STATUS: Immunization History  Administered Date(s) Administered   Influenza,inj,quad, With Preservative 09/24/2015   Pneumococcal Conjugate-13 09/02/2015   Pneumococcal Polysaccharide-23 10/01/2005   Tdap 10/01/2005, 09/02/2015    Diabetes She presents for her follow-up diabetic visit. She has type 2 diabetes mellitus. Onset time: diagnosed approx 50 years ago per husband report. Her disease course has been fluctuating. There are no hypoglycemic associated  symptoms. (She did have one episode of hypoglycemia since last visit, no record of it on logs) Associated symptoms include blurred vision, fatigue, foot paresthesias,  polydipsia, polyuria and weight loss. There are no hypoglycemic complications. Symptoms are stable. Diabetic complications include a CVA (TIA in past), nephropathy, peripheral neuropathy and PVD. Risk factors for coronary artery disease include diabetes mellitus, dyslipidemia, family history, obesity, hypertension and sedentary lifestyle. Current diabetic treatment includes intensive insulin  program. She is compliant with treatment most of the time (lives in SNF). She is following a generally unhealthy diet. When asked about meal planning, she reported none. She has not had a previous visit with a dietitian. She rarely participates in exercise. Her home blood glucose trend is fluctuating minimally. Her breakfast blood glucose range is generally 110-130 mg/dl. Her lunch blood glucose range is generally >200 mg/dl. Her dinner blood glucose range is generally >200 mg/dl. Her bedtime blood glucose range is generally >200 mg/dl. Her overall blood glucose range is >200 mg/dl. (She presents today, accompanied by her husband, with logs showing at target fasting and significantly above target postprandial readings.  Her most recent A1c, checked on 4/3 was 10.7%, improving slightly from last A1c of 11.1%.  She notes she does not like the food from the NH, thus will have outside food brought in.  She admits to consuming diet, or SF products as she thought they were healthier. ) An ACE inhibitor/angiotensin II receptor blocker is not being taken. She sees a podiatrist.Eye exam is current.     Review of systems  Constitutional: + minimally fluctuating body weight, current Body mass index is 49.69 kg/m., no fatigue, no subjective hyperthermia, no subjective hypothermia Eyes: no blurry vision, no xerophthalmia ENT: no sore throat, no nodules palpated in throat, no dysphagia/odynophagia, no hoarseness Cardiovascular: no chest pain, no shortness of breath, no palpitations, no leg swelling Respiratory: no cough, no  shortness of breath Gastrointestinal: no nausea/vomiting/diarrhea Musculoskeletal: essentially WC bound due to deconditioning, right foot in ortho boot Skin: no rashes, no hyperemia Neurological: no tremors, no numbness, no tingling, no dizziness Psychiatric: no depression, no anxiety  Objective:     BP 130/80 (BP Location: Right Arm, Patient Position: Sitting, Cuff Size: Large)   Pulse 65   Ht 5\' 1"  (1.549 m)   Wt 263 lb (119.3 kg) Comment: per patient - weight one week ago at [facility]  LMP 08/06/2012   BMI 49.69 kg/m   Wt Readings from Last 3 Encounters:  04/16/24 263 lb (119.3 kg)  02/15/24 263 lb (119.3 kg)  02/07/24 263 lb (119.3 kg)     BP Readings from Last 3 Encounters:  04/16/24 130/80  03/19/24 135/77  03/01/24 (!) 147/68     Physical Exam- Limited  Constitutional:  Body mass index is 49.69 kg/m. , not in acute distress, normal state of mind Eyes:  EOMI, no exophthalmos Musculoskeletal: essentially WC bound due to deconditioning, right foot in ortho boot Skin:  no rashes, no hyperemia Neurological: no tremor with outstretched hands    CMP ( most recent) CMP     Component Value Date/Time   NA 138 12/28/2023 1101   K 3.8 12/28/2023 1101   CL 97 (L) 12/28/2023 1101   CO2 32 12/28/2023 1101   GLUCOSE 268 (H) 12/28/2023 1101   BUN 27 (H) 12/28/2023 1101   CREATININE 1.64 (H) 12/28/2023 1101   CALCIUM  9.8 12/28/2023 1101   PROT 7.2 12/28/2023 1101   ALBUMIN 3.2 (L) 12/28/2023 1101   AST 19  12/28/2023 1101   ALT 14 12/28/2023 1101   ALKPHOS 102 12/28/2023 1101   BILITOT 0.6 12/28/2023 1101   GFRNONAA 36 02/10/2024 0943     Diabetic Labs (most recent): Lab Results  Component Value Date   HGBA1C 8.2 (H) 11/13/2023   HGBA1C 8.5 (H) 06/27/2023   HGBA1C 7.2 (H) 08/03/2022     Lipid Panel ( most recent) Lipid Panel  No results found for: "CHOL", "TRIG", "HDL", "CHOLHDL", "VLDL", "LDLCALC", "LDLDIRECT", "LABVLDL"             Assessment  & Plan:   1) Type 2 diabetes mellitus with hyperglycemia, with long-term current use of insulin  (HCC)  She presents today, accompanied by her husband, with logs showing at target fasting and significantly above target postprandial readings.  Her most recent A1c, checked on 4/3 was 10.7%, improving slightly from last A1c of 11.1%.  She notes she does not like the food from the NH, thus will have outside food brought in.  She admits to consuming diet, or SF products as she thought they were healthier.   - MELLONIE VILLWOCK has currently uncontrolled symptomatic type 2 DM since her 23s.   -Recent labs reviewed.  - I had a long discussion with her about the progressive nature of diabetes and the pathology behind its complications. -her diabetes is complicated by TIA, CKD, PVD, neuropathy and she remains at a high risk for more acute and chronic complications which include CAD, CVA, CKD, retinopathy, and neuropathy. These are all discussed in detail with her.  The following Lifestyle Medicine recommendations according to American College of Lifestyle Medicine [facility]) were discussed and offered to patient and she agrees to start the journey:  A. Whole Foods, Plant-based plate comprising of fruits and vegetables, plant-based proteins, whole-grain carbohydrates was discussed in detail with the patient.   A list for source of those nutrients were also provided to the patient.  Patient will use only water  or unsweetened tea for hydration. B.  The need to stay away from risky substances including alcohol, smoking; obtaining 7 to 9 hours of restorative sleep, at least 150 minutes of moderate intensity exercise weekly, the importance of healthy social connections,  and stress reduction techniques were discussed. C.  A full color page of  Calorie density of various food groups per pound showing examples of each food groups was provided to the patient.  - Nutritional counseling repeated at each appointment due  to patients tendency to fall back in to old habits.  - The patient admits there is a room for improvement in their diet and drink choices. -  Suggestion is made for the patient to avoid simple carbohydrates from their diet including Cakes, Sweet Desserts / Pastries, Ice Cream, Soda (diet and regular), Sweet Tea, Candies, Chips, Cookies, Sweet Pastries, Store Bought Juices, Alcohol in Excess of 1-2 drinks a day, Artificial Sweeteners, Coffee Creamer, and "Sugar-free" Products. This will help patient to have stable blood glucose profile and potentially avoid unintended weight gain.   - I encouraged the patient to switch to unprocessed or minimally processed complex starch and increased protein intake (animal or plant source), fruits, and vegetables.   - Patient is advised to stick to a routine mealtimes to eat 3 meals a day and avoid unnecessary snacks (to snack only to correct hypoglycemia).  - I have approached her with the following individualized plan to manage her diabetes and patient agrees:   -She is advised to continue Lantus  70 units SQ nightly  and adjust her Novolog  back to 14-20 units TID with meals if glucose is above 90 and she is eating (Specific instructions on how to titrate insulin  dosage based on glucose readings given to patient in writing).    -she is encouraged to continue monitoring glucose 4 times daily, before meals and before bed, and to call the clinic if she has readings less than 70 or above 300 for 3 tests in a row.    - she is warned not to take insulin  without proper monitoring per orders. - Adjustment parameters are given to her for hypo and hyperglycemia in writing.  - she is not candidate for Trulicity, did not tolerate well in the past, nor Sulfa meds as she is allergic.  She is also not a candidate for Metformin given stage 3 renal failure.  - Specific targets for  A1c; LDL, HDL, and Triglycerides were discussed with the patient.  2) Blood Pressure  /Hypertension:  her blood pressure is controlled to target.   she is advised to continue her current medications as prescribed by her PCP.  3) Lipids/Hyperlipidemia:    There is no recent lipid panel available to review .  she is advised to continue Lipitor 10 mg daily at bedtime.  Side effects and precautions discussed with her.  4)  Weight/Diet:  her Body mass index is 49.69 kg/m.  -  clearly complicating her diabetes care.   she is a candidate for weight loss. I discussed with her the fact that loss of 5 - 10% of her  current body weight will have the most impact on her diabetes management.  Exercise, and detailed carbohydrates information provided  -  detailed on discharge instructions.  5) Chronic Care/Health Maintenance: -she is not on ACEI/ARB and is on Statin medications and is encouraged to initiate and continue to follow up with Ophthalmology, Dentist, Podiatrist at least yearly or according to recommendations, and advised to stay away from smoking. I have recommended yearly flu vaccine and pneumonia vaccine at least every 5 years; moderate intensity exercise for up to 150 minutes weekly; and sleep for at least 7 hours a day.  - she is advised to maintain close follow up with [name], Mariah Shines, NP for primary care needs, as well as her other providers for optimal and coordinated care.     I spent  46  minutes in the care of the patient today including review of labs from CMP, Lipids, Thyroid  Function, Hematology (current and previous including abstractions from other facilities); face-to-face time discussing  her blood glucose readings/logs, discussing hypoglycemia and hyperglycemia episodes and symptoms, medications doses, her options of short and long term treatment based on the latest standards of care / guidelines;  discussion about incorporating lifestyle medicine;  and documenting the encounter. Risk reduction counseling performed per USPSTF guidelines to reduce obesity and  cardiovascular risk factors.     Please refer to Patient Instructions for Blood Glucose Monitoring and Insulin /Medications Dosing Guide"  in media tab for additional information. Please  also refer to " Patient Self Inventory" in the Media  tab for reviewed elements of pertinent patient history.  Elouise Haley Anderle participated in the discussions, expressed understanding, and voiced agreement with the above plans.  All questions were answered to her satisfaction. she is encouraged to contact clinic should she have any questions or concerns prior to her return visit.     Follow up plan: - Return in about 3 months (around 07/17/2024) for Diabetes F/U with A1c  in office, No previsit labs, Bring meter and logs.   Hulon Magic, [facility] [address] [place], [place] [address] Phone: [phone] Fax: [phone]  04/16/2024, 11:40 AM

## 2024-04-17 DIAGNOSIS — Z79899 Other long term (current) drug therapy: Secondary | ICD-10-CM | POA: Diagnosis not present

## 2024-04-17 DIAGNOSIS — E1165 Type 2 diabetes mellitus with hyperglycemia: Secondary | ICD-10-CM | POA: Diagnosis not present

## 2024-04-18 ENCOUNTER — Telehealth: Payer: Self-pay

## 2024-04-18 NOTE — Telephone Encounter (Signed)
 Left a message requesting [name] with [facility] to return call to office.

## 2024-04-18 NOTE — Telephone Encounter (Signed)
 I must have misread the MAR.  She can continue current SSI for now and reach out if still high in the next few weeks.

## 2024-04-18 NOTE — Telephone Encounter (Signed)
 Spoke with Samantha from [facility], she stated the SS orders they received from her most recent visit is the same SS they have been using since 02/14/24. Stated they wanted to clarify if you wanted to make any changes to pt's SS.

## 2024-04-19 DIAGNOSIS — E039 Hypothyroidism, unspecified: Secondary | ICD-10-CM | POA: Diagnosis not present

## 2024-04-19 DIAGNOSIS — M6281 Muscle weakness (generalized): Secondary | ICD-10-CM | POA: Diagnosis not present

## 2024-04-19 DIAGNOSIS — N390 Urinary tract infection, site not specified: Secondary | ICD-10-CM | POA: Diagnosis not present

## 2024-04-19 DIAGNOSIS — Z79899 Other long term (current) drug therapy: Secondary | ICD-10-CM | POA: Diagnosis not present

## 2024-04-19 DIAGNOSIS — I130 Hypertensive heart and chronic kidney disease with heart failure and stage 1 through stage 4 chronic kidney disease, or unspecified chronic kidney disease: Secondary | ICD-10-CM | POA: Diagnosis not present

## 2024-04-19 DIAGNOSIS — D509 Iron deficiency anemia, unspecified: Secondary | ICD-10-CM | POA: Diagnosis not present

## 2024-04-19 DIAGNOSIS — J189 Pneumonia, unspecified organism: Secondary | ICD-10-CM | POA: Diagnosis not present

## 2024-04-19 NOTE — Telephone Encounter (Signed)
 Spoke with Samantha from [facility] advising her to continue pt's current SSI regimen and to contact the office if pt continues to experieince high readings in the next few weeks per Whitney Reardon,FNP. Understanding voiced.

## 2024-04-23 ENCOUNTER — Encounter (HOSPITAL_BASED_OUTPATIENT_CLINIC_OR_DEPARTMENT_OTHER): Admitting: General Surgery

## 2024-04-23 DIAGNOSIS — Z6841 Body Mass Index (BMI) 40.0 and over, adult: Secondary | ICD-10-CM | POA: Diagnosis not present

## 2024-04-23 DIAGNOSIS — E11621 Type 2 diabetes mellitus with foot ulcer: Secondary | ICD-10-CM | POA: Diagnosis not present

## 2024-04-23 DIAGNOSIS — J449 Chronic obstructive pulmonary disease, unspecified: Secondary | ICD-10-CM | POA: Diagnosis not present

## 2024-04-23 DIAGNOSIS — L97412 Non-pressure chronic ulcer of right heel and midfoot with fat layer exposed: Secondary | ICD-10-CM | POA: Diagnosis not present

## 2024-04-23 DIAGNOSIS — I5032 Chronic diastolic (congestive) heart failure: Secondary | ICD-10-CM | POA: Diagnosis not present

## 2024-04-23 DIAGNOSIS — E1165 Type 2 diabetes mellitus with hyperglycemia: Secondary | ICD-10-CM | POA: Diagnosis not present

## 2024-04-24 DIAGNOSIS — N1832 Chronic kidney disease, stage 3b: Secondary | ICD-10-CM | POA: Diagnosis not present

## 2024-04-24 DIAGNOSIS — K219 Gastro-esophageal reflux disease without esophagitis: Secondary | ICD-10-CM | POA: Diagnosis not present

## 2024-04-24 DIAGNOSIS — Z79899 Other long term (current) drug therapy: Secondary | ICD-10-CM | POA: Diagnosis not present

## 2024-04-24 DIAGNOSIS — I10 Essential (primary) hypertension: Secondary | ICD-10-CM | POA: Diagnosis not present

## 2024-04-24 DIAGNOSIS — F32A Depression, unspecified: Secondary | ICD-10-CM | POA: Diagnosis not present

## 2024-04-25 DIAGNOSIS — K219 Gastro-esophageal reflux disease without esophagitis: Secondary | ICD-10-CM | POA: Diagnosis not present

## 2024-04-25 DIAGNOSIS — J449 Chronic obstructive pulmonary disease, unspecified: Secondary | ICD-10-CM | POA: Diagnosis not present

## 2024-04-25 DIAGNOSIS — E039 Hypothyroidism, unspecified: Secondary | ICD-10-CM | POA: Diagnosis not present

## 2024-04-25 DIAGNOSIS — E785 Hyperlipidemia, unspecified: Secondary | ICD-10-CM | POA: Diagnosis not present

## 2024-04-25 DIAGNOSIS — N390 Urinary tract infection, site not specified: Secondary | ICD-10-CM | POA: Diagnosis not present

## 2024-04-25 DIAGNOSIS — E1165 Type 2 diabetes mellitus with hyperglycemia: Secondary | ICD-10-CM | POA: Diagnosis not present

## 2024-04-25 DIAGNOSIS — I10 Essential (primary) hypertension: Secondary | ICD-10-CM | POA: Diagnosis not present

## 2024-04-25 DIAGNOSIS — D649 Anemia, unspecified: Secondary | ICD-10-CM | POA: Diagnosis not present

## 2024-04-25 DIAGNOSIS — E559 Vitamin D deficiency, unspecified: Secondary | ICD-10-CM | POA: Diagnosis not present

## 2024-04-26 DIAGNOSIS — L602 Onychogryphosis: Secondary | ICD-10-CM | POA: Diagnosis not present

## 2024-04-26 DIAGNOSIS — L603 Nail dystrophy: Secondary | ICD-10-CM | POA: Diagnosis not present

## 2024-04-26 DIAGNOSIS — Z794 Long term (current) use of insulin: Secondary | ICD-10-CM | POA: Diagnosis not present

## 2024-04-26 DIAGNOSIS — E1151 Type 2 diabetes mellitus with diabetic peripheral angiopathy without gangrene: Secondary | ICD-10-CM | POA: Diagnosis not present

## 2024-05-01 ENCOUNTER — Ambulatory Visit (INDEPENDENT_AMBULATORY_CARE_PROVIDER_SITE_OTHER): Payer: Medicare HMO | Admitting: Neurology

## 2024-05-01 ENCOUNTER — Encounter: Payer: Self-pay | Admitting: Neurology

## 2024-05-01 VITALS — BP 148/70 | HR 75 | Ht 61.0 in | Wt 283.0 lb

## 2024-05-01 DIAGNOSIS — G629 Polyneuropathy, unspecified: Secondary | ICD-10-CM | POA: Diagnosis not present

## 2024-05-01 NOTE — Progress Notes (Unsigned)
 NEUROLOGY FOLLOW UP OFFICE NOTE  COOPER STAMP [id number] 08-Jun-1961  HISTORY OF PRESENT ILLNESS: I had the pleasure of seeing [name] in follow-up in the neurology clinic on 05/01/2024.  The patient was last seen 7 months ago.  and is accompanied by *** today.  Records and images were personally reviewed where available.   Sleeping well, still sleepy during the day Had a wound on right foot from new shoe, better No falls in 2 yrs Wait until hbac is 8, last was 10 before doing surgery No HAs, dizziness when turning head to right Walker at [facility]  I had the pleasure of seeing [name] in follow-up in the neurology clinic on 09/30/2023.  The patient was last seen 6 months ago. She is accompanied by [facility] staff [name] who helps supplement the history today.  Records and images were personally reviewed where available.  She was initially seen for dizziness described as a sensation of imbalance with frequent falls. She also had a right foot drop. 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 It also showed very severe right medial neuropathy at the wrist. She had an MRI lumbar spine without contrast 12/2022 which did not show any significant stenosis, there was mild disc bulging and facet hypertrophy, chronic fatty atrophy of the erector spinae musculature bilaterally.  She has seen Ortho hand who repeated EMG/NCV showing bilateral carpal tunnel syndrome. Surgery was discussed however she was recovering from pneumonia on August visit. She has an appointment next month. She continues to notice numbness in the right hand, dropping things. Her right foot is wrapped in gauze and in a soft brace, she reports there are  slow-healing blisters. She denies any pain. She is on Gabapentin  300mg  in AM, 600mg  in PM (dose increased on last visit) and reports daytime drowsiness. No falls.    History on Initial Assessment 10/19/2022: This is a very pleasant 63 year old right-handed woman with a history of DM, hypertension, hyperlipidemia, hypothyroidism, COPD, colon cancer, presenting for evaluation of dizziness. She was recently evaluated by ENT, no signs of peripheral cause of vertigo, suspect central cause of dizziness. She and her husband report that symptoms started around 4 years ago when she suddenly started having frequent falls. She describes the dizziness as more of a balance issue. She denies any vertigo or lightheadedness. Symptoms only occur when standing, she does not feel "dizzy" when supine or sitting. She denies any headaches, double vision, difficulty swallowing. No neck or back pain. They report her last fall was in December 2022, at that time her right leg went numb and she fell. She has been at [facility] since February 2023 due to the frequent falls. They report she has not fallen since then because she has not done much walking, she is mostly in the wheelchair or uses a walker. She has had numbness in both feet and her right hand for several years. She describes constant numbness and tingling in her feet and right hand, with stabbing pain in her feet and achiness in her right hand. She cannot flex her right foot and states her toes are curling in. She has been diabetic "since age 63." She states glucose levels are "bad," she thinks her last HbA1c was 9 (7.2 in 07/2022). At the SNF,  glucose levels go up to 300, but are low when she wakes up. She reports insulin  dose was changed recently and she is on a strict diet. She has been doing physical therapy for the past 6 months but recently finished.   She had a head CT in 12/2021 ordered for altered mental status in the setting of hypoglycemia ("glucose <10"), no  acute changes, there was atrophy and chronic microvascular disease.   PAST MEDICAL HISTORY: Past Medical History:  Diagnosis Date   Anemia    Arthritis    Asthma    Cancer of sigmoid (HCC)    COPD (chronic obstructive pulmonary disease) (HCC)    Depression    Diabetes mellitus    Diastolic dysfunction 05/15/2015   Dyspnea    Generalized weakness    Hyperlipidemia    Hypertension    Hypothyroidism    Obesity    Palpitations    Pneumonia    PONV (postoperative nausea and vomiting)    Prolonged QT interval 05/14/2015   Sleep apnea     MEDICATIONS: Current Outpatient Medications on File Prior to Visit  Medication Sig Dispense Refill   albuterol  (VENTOLIN  HFA) 108 (90 Base) MCG/ACT inhaler Inhale 2 puffs into the lungs every 6 (six) hours as needed for wheezing or shortness of breath.     alum & mag hydroxide-simeth (MAALOX/MYLANTA) 200-200-20 MG/5ML suspension Take 30 mLs by mouth every 2 (two) hours as needed for indigestion or heartburn. Do not exceed 4 doses in 24 hours     Amino Acids-Protein Hydrolys (FEEDING SUPPLEMENT, PRO-STAT 64,) LIQD Take 30 mLs by mouth daily.     amLODipine  (NORVASC ) 10 MG tablet Take 10 mg by mouth daily.     ascorbic acid  (VITAMIN C ) 500 MG tablet Take 500 mg by mouth 2 (two) times daily.     aspirin  EC 81 MG tablet Take 81 mg by mouth daily. Swallow whole.     atorvastatin  (LIPITOR) 10 MG tablet Take 10 mg by mouth at bedtime.     augmented betamethasone dipropionate (DIPROLENE-AF) 0.05 % cream Apply 1 application  topically 2 (two) times daily. Apply to trunk, buttocks, legs for Psoriasis     BD AUTOSHIELD DUO 30G X 5 MM MISC      Biotin 10 MG TABS Take 10 mg by mouth daily.     cetirizine (ZYRTEC) 10 MG tablet Take 10 mg by mouth daily.     Cholecalciferol (VITAMIN D) 50 MCG (2000 UT) CAPS Take 2,000 Units by mouth daily.     Cranberry 300 MG tablet Take 300 mg by mouth 2 (two) times daily.     docusate sodium  (COLACE) 100 MG capsule Take 100  mg by mouth daily.     estradiol  (ESTRACE ) 0.1 MG/GM vaginal cream Discard plastic applicator. Insert a blueberry size amount (approximately 1 gram) of cream on fingertip inside vagina at bedtime every night for 1 week then every other night routinely (for long term use). 30 g 3   fluticasone furoate-vilanterol (BREO ELLIPTA ) 100-25 MCG/ACT AEPB Inhale 1 puff into the lungs daily. 180 each 5   fluticasone-salmeterol (ADVAIR) 250-50 MCG/ACT AEPB Inhale 1 puff into the lungs 2 (two) times daily.     furosemide  (LASIX ) 20 MG tablet Take 20 mg by mouth daily.     gabapentin  (NEURONTIN ) 300 MG capsule Take 1 capsule in AM, 1 capsule in PM 180 capsule 3   hydrALAZINE  (APRESOLINE ) 25 MG tablet Take 1 tablet (25 mg total) by mouth every 8 (  eight) hours.     LANTUS  100 UNIT/ML injection Inject 0.7 mLs (70 Units total) into the skin at bedtime. 60 mL 3   levothyroxine  (SYNTHROID ) 88 MCG tablet Take 88 mcg by mouth daily before breakfast.     Menthol-Zinc  Oxide (CALMOSEPTINE) 0.44-20.6 % OINT Apply 1 Application topically daily as needed (preservation/protection after incontinent care).     Multiple Vitamins-Minerals (MULTIVITAMIN WITH MINERALS) tablet Take 1 tablet by mouth daily.     MYRBETRIQ  50 MG TB24 tablet Take 1 tablet (50 mg total) by mouth daily. 30 tablet 11   NOVOLOG  100 UNIT/ML injection Inject 14-20 Units into the skin 3 (three) times daily with meals. 90-150= 14 units; 151-200= 15 units; 201-250= 16 units; 251-300= 17 units; 301-350= 18 units; 351-400= 19 units; above 400 = 20 units 60 mL 3   omeprazole (PRILOSEC) 20 MG capsule Take 20 mg by mouth daily.     ondansetron  (ZOFRAN ) 8 MG tablet Take 8 mg by mouth 2 (two) times daily.     OXYGEN  Inhale 2 L into the lungs continuous.     potassium chloride  SA (K-DUR) 20 MEQ tablet Take 20 mEq by mouth daily.     saccharomyces boulardii (FLORASTOR) 250 MG capsule Take 250 mg by mouth 2 (two) times daily.     sertraline  (ZOLOFT ) 100 MG tablet Take 100  mg by mouth daily.     trimethoprim  (TRIMPEX ) 100 MG tablet Take 1 tablet (100 mg total) by mouth daily. 30 tablet 11   No current facility-administered medications on file prior to visit.    ALLERGIES: Allergies  Allergen Reactions   Carvedilol  Palpitations   Benicar [Olmesartan] Swelling   Codeine Other (See Comments)    Confusion    Sulfa Antibiotics Swelling    Whole face swells   Trulicity [Dulaglutide] Diarrhea   Ceftriaxone  Hives and Rash    FAMILY HISTORY: Family History  Problem Relation Age of Onset   Stroke Mother    Diabetes Father    Heart failure Father    Hypertension Father    Diabetes Sister    Heart failure Sister    Hypertension Sister    Stroke Sister    Cancer Other    Stroke Sister     SOCIAL HISTORY: Social History   Socioeconomic History   Marital status: Married    Spouse name: Not on file   Number of children: Not on file   Years of education: Not on file   Highest education level: Not on file  Occupational History   Not on file  Tobacco Use   Smoking status: Never    Passive exposure: Yes   Smokeless tobacco: Never  Vaping Use   Vaping status: Never Used  Substance and Sexual Activity   Alcohol use: No    Alcohol/week: 0.0 standard drinks of alcohol   Drug use: No   Sexual activity: Yes    Birth control/protection: None  Other Topics Concern   Not on file  Social History Narrative   Lives with husband.  One living son.  Daughter died after transplant age 59.     Are you right handed or left handed? Right   Are you currently employed ? 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    What type of home do you live in: 1 story or 2 story? one       Social Drivers of Dispensing optician  Resource Strain: Medium Risk (08/14/2021)   Received from [facility], [facility]   Overall Financial Resource Strain (CARDIA)    Difficulty of Paying Living Expenses: Somewhat hard  Food  Insecurity: No Food Insecurity (11/12/2023)   Hunger Vital Sign    Worried About Running Out of Food in the Last Year: Never true    Ran Out of Food in the Last Year: Never true  Transportation Needs: No Transportation Needs (11/12/2023)   PRAPARE - Administrator, Civil Service (Medical): No    Lack of Transportation (Non-Medical): No  Physical Activity: Inactive (08/14/2021)   Received from [facility], [facility]   Exercise Vital Sign    Days of Exercise per Week: 0 days    Minutes of Exercise per Session: 0 min  Stress: Stress Concern Present (08/14/2021)   Received from [facility], [facility] - Occupational Stress Questionnaire    Feeling of Stress : Very much  Social Connections: Unknown (04/08/2022)   Received from [facility], [facility]   Social Network    Social Network: Not on file  Intimate Partner Violence: Not At Risk (11/12/2023)   Humiliation, Afraid, Rape, and Kick questionnaire    Fear of Current or Ex-Partner: No    Emotionally Abused: No    Physically Abused: No    Sexually Abused: No     PHYSICAL EXAM: Vitals:   05/01/24 1456  BP: (!) 148/70  Pulse: 75  SpO2: 96%   General: No acute distress Head:  Normocephalic/atraumatic Skin/Extremities: No rash, no edema Neurological Exam: alert and oriented to person, place, and time. No aphasia or dysarthria. Fund of knowledge is appropriate.  Recent and remote memory are intact.  Attention and concentration are normal.   Cranial nerves: Pupils equal, round. Extraocular movements intact with no nystagmus. Visual fields full.  No facial asymmetry.  Motor: Bulk and tone normal, muscle strength 5/5 throughout with no pronator drift.   Finger to nose testing intact.  Gait narrow-based and steady, able to tandem walk adequately.  Romberg negative.   IMPRESSION: This is a very pleasant 63 yo RH woman with a history of DM, hypertension, hyperlipidemia,  hypothyroidism, COPD, colon cancer, who presented for dizziness described as sensation of imbalance, likely due to severe neuropathy confirmed on EMG. She denies any falls. She has seen Ortho Hand for the carpal tunnel syndrome, advised to wear wrist splints. She denies any pain and has some drowsiness, we discussed reducing Gabapentin  back to 300mg  BID. Continue working on glucose control and PT. Follow-up in 6 months, call for any changes.     Thank you for allowing me to participate in *** care.  Please do not hesitate to call for any questions or concerns.  The duration of this appointment visit was *** minutes of face-to-face time with the patient.  Greater than 50% of this time was spent in counseling, explanation of diagnosis, planning of further management, and coordination of care.   Rayfield Cairo, M.D.   CC: ***

## 2024-05-01 NOTE — Patient Instructions (Signed)
 Good to see you!  Continue Gabapentin  300mg  twice a day  Continue working on lowering HbA1c  Use wrist splints every night  Follow-up in 1 year, call for any changes

## 2024-05-07 ENCOUNTER — Inpatient Hospital Stay: Attending: Hematology

## 2024-05-07 DIAGNOSIS — D509 Iron deficiency anemia, unspecified: Secondary | ICD-10-CM | POA: Diagnosis not present

## 2024-05-07 DIAGNOSIS — Z85048 Personal history of other malignant neoplasm of rectum, rectosigmoid junction, and anus: Secondary | ICD-10-CM | POA: Insufficient documentation

## 2024-05-07 DIAGNOSIS — C187 Malignant neoplasm of sigmoid colon: Secondary | ICD-10-CM

## 2024-05-07 LAB — CBC WITH DIFFERENTIAL/PLATELET
Abs Immature Granulocytes: 0.05 10*3/uL (ref 0.00–0.07)
Basophils Absolute: 0.1 10*3/uL (ref 0.0–0.1)
Basophils Relative: 1 %
Eosinophils Absolute: 0.9 10*3/uL — ABNORMAL HIGH (ref 0.0–0.5)
Eosinophils Relative: 9 %
HCT: 38.4 % (ref 36.0–46.0)
Hemoglobin: 12.5 g/dL (ref 12.0–15.0)
Immature Granulocytes: 1 %
Lymphocytes Relative: 16 %
Lymphs Abs: 1.6 10*3/uL (ref 0.7–4.0)
MCH: 28.5 pg (ref 26.0–34.0)
MCHC: 32.6 g/dL (ref 30.0–36.0)
MCV: 87.5 fL (ref 80.0–100.0)
Monocytes Absolute: 0.5 10*3/uL (ref 0.1–1.0)
Monocytes Relative: 5 %
Neutro Abs: 6.9 10*3/uL (ref 1.7–7.7)
Neutrophils Relative %: 68 %
Platelets: 252 10*3/uL (ref 150–400)
RBC: 4.39 MIL/uL (ref 3.87–5.11)
RDW: 13.7 % (ref 11.5–15.5)
WBC: 9.9 10*3/uL (ref 4.0–10.5)
nRBC: 0 % (ref 0.0–0.2)

## 2024-05-07 LAB — COMPREHENSIVE METABOLIC PANEL WITH GFR
ALT: 16 U/L (ref 0–44)
AST: 17 U/L (ref 15–41)
Albumin: 3.3 g/dL — ABNORMAL LOW (ref 3.5–5.0)
Alkaline Phosphatase: 111 U/L (ref 38–126)
Anion gap: 11 (ref 5–15)
BUN: 30 mg/dL — ABNORMAL HIGH (ref 8–23)
CO2: 29 mmol/L (ref 22–32)
Calcium: 9.6 mg/dL (ref 8.9–10.3)
Chloride: 102 mmol/L (ref 98–111)
Creatinine, Ser: 1.73 mg/dL — ABNORMAL HIGH (ref 0.44–1.00)
GFR, Estimated: 33 mL/min — ABNORMAL LOW (ref >60)
Glucose, Bld: 85 mg/dL (ref 70–99)
Potassium: 4.5 mmol/L (ref 3.5–5.1)
Sodium: 142 mmol/L (ref 135–145)
Total Bilirubin: 0.5 mg/dL (ref 0.0–1.2)
Total Protein: 7.1 g/dL (ref 6.5–8.1)

## 2024-05-08 ENCOUNTER — Encounter (HOSPITAL_BASED_OUTPATIENT_CLINIC_OR_DEPARTMENT_OTHER): Attending: General Surgery | Admitting: General Surgery

## 2024-05-08 DIAGNOSIS — J449 Chronic obstructive pulmonary disease, unspecified: Secondary | ICD-10-CM | POA: Diagnosis not present

## 2024-05-08 DIAGNOSIS — I5032 Chronic diastolic (congestive) heart failure: Secondary | ICD-10-CM | POA: Insufficient documentation

## 2024-05-08 DIAGNOSIS — E11621 Type 2 diabetes mellitus with foot ulcer: Secondary | ICD-10-CM | POA: Diagnosis not present

## 2024-05-08 DIAGNOSIS — L97412 Non-pressure chronic ulcer of right heel and midfoot with fat layer exposed: Secondary | ICD-10-CM | POA: Diagnosis not present

## 2024-05-08 DIAGNOSIS — Z8631 Personal history of diabetic foot ulcer: Secondary | ICD-10-CM | POA: Insufficient documentation

## 2024-05-08 DIAGNOSIS — Z09 Encounter for follow-up examination after completed treatment for conditions other than malignant neoplasm: Secondary | ICD-10-CM | POA: Diagnosis not present

## 2024-05-08 DIAGNOSIS — E1165 Type 2 diabetes mellitus with hyperglycemia: Secondary | ICD-10-CM | POA: Diagnosis not present

## 2024-05-08 DIAGNOSIS — Z6841 Body Mass Index (BMI) 40.0 and over, adult: Secondary | ICD-10-CM | POA: Insufficient documentation

## 2024-05-08 LAB — CEA: CEA: 1.9 ng/mL (ref 0.0–4.7)

## 2024-05-14 ENCOUNTER — Inpatient Hospital Stay (HOSPITAL_BASED_OUTPATIENT_CLINIC_OR_DEPARTMENT_OTHER): Admitting: Hematology

## 2024-05-14 VITALS — BP 156/63 | HR 74 | Temp 97.0°F | Resp 20 | Wt 289.7 lb

## 2024-05-14 DIAGNOSIS — D508 Other iron deficiency anemias: Secondary | ICD-10-CM

## 2024-05-14 DIAGNOSIS — Z85048 Personal history of other malignant neoplasm of rectum, rectosigmoid junction, and anus: Secondary | ICD-10-CM | POA: Diagnosis not present

## 2024-05-14 DIAGNOSIS — C187 Malignant neoplasm of sigmoid colon: Secondary | ICD-10-CM | POA: Diagnosis not present

## 2024-05-14 DIAGNOSIS — D509 Iron deficiency anemia, unspecified: Secondary | ICD-10-CM | POA: Diagnosis not present

## 2024-05-14 NOTE — Progress Notes (Signed)
 [facility] [address] [place] [address]    Clinic Day:  05/14/2024  Referring physician: Fonda Hymen, NP  Patient Care Team: Fonda Hymen, NP as PCP - General (Adult Health Nurse Practitioner) Alyce Jubilee, MD (Inactive) as Consulting Physician (Gastroenterology) Paulett Boros, MD as Medical Oncologist (Medical Oncology) Gerhard Knuckles, RN as Oncology Nurse Navigator (Medical Oncology) Jhonny Moss, MD as Consulting Physician (Neurology)   ASSESSMENT & PLAN:   Assessment: 1.  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  Rectal mass biopsy consistent with moderately differentiated adenocarcinoma. - CEA (04/06/2022): 25.7. - CT CAP (04/27/2022): No evidence of mass, lymphadenopathy or metastatic disease in the chest, abdomen or pelvis.  Clustered subsolid nodular opacities in the superior segment of the left lower lobe measures 0.6 cm. - Rectal MRI (05/10/2022): Lesion is in the sigmoid colon measuring 4 cm with findings for small volume extension into sigmoid mesocolon.  Intermittent association with adjacent uterus without gross invasion.  Sigmoid mesocolon node, borderline size.  Right external iliac nodes are favored to be reactive. - Low anterior resection (08/11/2022) by Dr. Andy Bannister - Pathology: Moderately differentiated adenocarcinoma, margins negative.  Site of tumor is rectosigmoid junction.  0/14 lymph nodes involved.  Negative LVI and PNI.  PT3 pN0.  MMR preserved.   2.  Social/family history: - She is a resident at [facility] nursing home for the past few months due to falls from right foot drop and numbness in the right hand, right foot and lower leg.  Previously she worked at the nursing home.  She is a non-smoker. - Maternal grandmother had colon cancer.    Plan: 1.  Stage II (T3 N0 M0)  rectosigmoid junction adenocarcinoma, MMR preserved: - Last CTAP (12/27/2022): Ill-defined heterogeneous hypodensity along the falciform ligament increased from prior measuring 3.9 x 2.5 cm.  Stable mildly enlarged right common iliac lymph node 9 mm in short axis.  No new lymphadenopathy. - MRI of the liver on 01/31/2024: No evidence of hepatic metastatic disease.  Area of interest is perfusion/postoperative change. - We reviewed labs from 05/07/2024: Normal LFTs.  CBC was normal.  CEA was 1.9. - Recommend follow-up in 6 months with CTAP with contrast and labs including CEA. - Moving forward she will have imaging once a year and follow-up once every 6 months with CEA and LFTs.   2.  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 - She is taking vitamin C  supplements.  She is not on iron therapy.  Hemoglobin is stable at 12.5.  Will check ferritin and iron panel at next visit.    No orders of the defined types were placed in this encounter.     Nadeen Augusta Teague,acting as a Neurosurgeon for Paulett Boros, MD.,have documented all relevant documentation on the behalf of Paulett Boros, MD,as directed by  Paulett Boros, MD while in the presence of Paulett Boros, MD.  I, Paulett Boros MD, have reviewed the above documentation for accuracy and completeness, and I agree with the above.     Paulett Boros, MD   6/9/20252:44 PM  CHIEF COMPLAINT:   Diagnosis: stage II rectosigmoid cancer    Cancer Staging  Cancer of sigmoid colon [facility]) Staging form: Colon and Rectum, AJCC 8th Edition - Clinical stage from 05/12/2022: Stage IIA (cT3, cN0, cM0) - Signed by Paulett Boros, MD on 09/08/2022  Prior Therapy: Low anterior resection on 08/11/2022   Current Therapy:  Surveillance    HISTORY OF PRESENT ILLNESS:   Oncology History  Cancer of sigmoid colon (HCC)  05/12/2022 Initial Diagnosis   Cancer of sigmoid colon (HCC)    05/12/2022 Cancer Staging   Staging form: Colon and Rectum, AJCC 8th Edition - Clinical stage from 05/12/2022: Stage IIA (cT3, cN0, cM0) - Signed by Paulett Boros, MD on 09/08/2022 Histopathologic type: Adenocarcinoma, NOS Total positive nodes: 0 Histologic grade (G): G2 Histologic grading system: 4 grade system      INTERVAL HISTORY:   [name] is a 63 y.o. female presenting to clinic today for follow up of stage II rectosigmoid cancer. She was last seen by me on 02/07/24.  Since her last visit, she had cystoscopy  on 02/16/24 with Dr. Claretta Croft.   Today, she states that she is doing well overall. Her appetite level is at 100%. Her energy level is at 75%.   PAST MEDICAL HISTORY:   Past Medical History: Past Medical History:  Diagnosis Date   Anemia    Arthritis    Asthma    Cancer of sigmoid (HCC)    COPD (chronic obstructive pulmonary disease) (HCC)    Depression    Diabetes mellitus    Diastolic dysfunction 05/15/2015   Dyspnea    Generalized weakness    Hyperlipidemia    Hypertension    Hypothyroidism    Obesity    Palpitations    Pneumonia    PONV (postoperative nausea and vomiting)    Prolonged QT interval 05/14/2015   Sleep apnea     Surgical History: Past Surgical History:  Procedure Laterality Date   BIOPSY  04/06/2022   Procedure: BIOPSY;  Surgeon: Urban Garden, MD;  [facility]: [facility];  Service: Gastroenterology;;   CATARACT EXTRACTION     CESAREAN SECTION     CHOLECYSTECTOMY     COLONOSCOPY WITH PROPOFOL  N/A 04/06/2022   Procedure: COLONOSCOPY WITH PROPOFOL ;  Surgeon: Urban Garden, MD;  Location: [facility];  Service: Gastroenterology;  Laterality: N/A;  805 ASA 2 patient in [facility]   CYSTOSCOPY WITH INJECTION N/A 02/16/2024   Procedure: CYSTOSCOPY WITH INJECTION- Bladder botox  100 units;  Surgeon: Marco Severs, MD;  Location: [facility];  Service: Urology;  Laterality: N/A;    ESOPHAGOGASTRODUODENOSCOPY (EGD) WITH PROPOFOL  N/A 04/06/2022   Procedure: ESOPHAGOGASTRODUODENOSCOPY (EGD) WITH PROPOFOL ;  Surgeon: Urban Garden, MD;  [facility]: [facility];  Service: Gastroenterology;  Laterality: N/A;   HEMOSTASIS CLIP PLACEMENT  04/06/2022   Procedure: HEMOSTASIS CLIP PLACEMENT;  Surgeon: Urban Garden, MD;  Location: [facility];  Service: Gastroenterology;;   POLYPECTOMY  04/06/2022   Procedure: POLYPECTOMY;  Surgeon: Umberto Ganong, Bearl Limes, MD;  [facility]: [facility];  Service: Gastroenterology;;   SUBMUCOSAL TATTOO INJECTION  04/06/2022   Procedure: SUBMUCOSAL TATTOO INJECTION;  Surgeon: Umberto Ganong, Bearl Limes, MD;  [facility]: [facility];  Service: Gastroenterology;;    Social History: Social History   Socioeconomic History   Marital status: Married    Spouse name: Not on file   Number of children: Not on file   Years of education: Not on file   Highest education level: Not on file  Occupational History   Not on file  Tobacco Use   Smoking status: Never    Passive exposure: Yes   Smokeless tobacco: Never  Vaping Use   Vaping status: Never Used  Substance and Sexual Activity  Alcohol use: No    Alcohol/week: 0.0 standard drinks of alcohol   Drug use: No   Sexual activity: Yes    Birth control/protection: None  Other Topics Concern   Not on file  Social History Narrative   Lives with husband.  One living son.  Daughter died after transplant age 73.     Are you right handed or left handed? Right   Are you currently employed ? 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    What type of home do you live in: 1 story or 2 story? one       Social Drivers of Corporate investment banker Strain: Medium Risk (08/14/2021)   Received from [facility]   Overall Financial Resource Strain (CARDIA)    Difficulty of Paying Living Expenses: Somewhat hard  Food  Insecurity: No Food Insecurity (11/12/2023)   Hunger Vital Sign    Worried About Running Out of Food in the Last Year: Never true    Ran Out of Food in the Last Year: Never true  Transportation Needs: No Transportation Needs (11/12/2023)   PRAPARE - Administrator, Civil Service (Medical): No    Lack of Transportation (Non-Medical): No  Physical Activity: Inactive (08/14/2021)   Received from [facility]   Exercise Vital Sign    Days of Exercise per Week: 0 days    Minutes of Exercise per Session: 0 min  Stress: Stress Concern Present (08/14/2021)   Received from [facility] - Occupational Stress Questionnaire    Feeling of Stress : Very much  Social Connections: Unknown (04/08/2022)   Received from [facility]   Social Network    Social Network: Not on file  Intimate Partner Violence: Not At Risk (11/12/2023)   Humiliation, Afraid, Rape, and Kick questionnaire    Fear of Current or Ex-Partner: No    Emotionally Abused: No    Physically Abused: No    Sexually Abused: No    Family History: Family History  Problem Relation Age of Onset   Stroke Mother    Diabetes Father    Heart failure Father    Hypertension Father    Diabetes Sister    Heart failure Sister    Hypertension Sister    Stroke Sister    Cancer Other    Stroke Sister     Current Medications:  Current Outpatient Medications:    amLODipine  (NORVASC ) 10 MG tablet, Take 10 mg by mouth daily., Disp: , Rfl:    ascorbic acid  (VITAMIN C ) 500 MG tablet, Take 500 mg by mouth 2 (two) times daily., Disp: , Rfl:    aspirin  EC 81 MG tablet, Take 81 mg by mouth daily. Swallow whole., Disp: , Rfl:    atorvastatin  (LIPITOR) 10 MG tablet, Take 10 mg by mouth at bedtime., Disp: , Rfl:    Biotin 10 MG TABS, Take 10 mg by mouth daily., Disp: , Rfl:    cetirizine (ZYRTEC) 10 MG tablet, Take 10 mg by mouth daily., Disp: , Rfl:     Cholecalciferol (VITAMIN D) 50 MCG (2000 UT) CAPS, Take 2,000 Units by mouth daily., Disp: , Rfl:    Cranberry 300 MG tablet, Take 300 mg by mouth 2 (two) times daily., Disp: , Rfl:    docusate sodium  (COLACE) 100 MG capsule, Take 100 mg  by mouth daily., Disp: , Rfl:    estradiol  (ESTRACE ) 0.1 MG/GM vaginal cream, Discard plastic applicator. Insert a blueberry size amount (approximately 1 gram) of cream on fingertip inside vagina at bedtime every night for 1 week then every other night routinely (for long term use)., Disp: 30 g, Rfl: 3   Menthol-Zinc  Oxide (CALMOSEPTINE) 0.44-20.6 % OINT, Apply 1 Application topically daily as needed (preservation/protection after incontinent care)., Disp: , Rfl:    albuterol  (VENTOLIN  HFA) 108 (90 Base) MCG/ACT inhaler, Inhale 2 puffs into the lungs every 6 (six) hours as needed for wheezing or shortness of breath., Disp: , Rfl:    alum & mag hydroxide-simeth (MAALOX/MYLANTA) 200-200-20 MG/5ML suspension, Take 30 mLs by mouth every 2 (two) hours as needed for indigestion or heartburn. Do not exceed 4 doses in 24 hours, Disp: , Rfl:    Amino Acids-Protein Hydrolys (FEEDING SUPPLEMENT, PRO-STAT 64,) LIQD, Take 30 mLs by mouth daily., Disp: , Rfl:    augmented betamethasone dipropionate (DIPROLENE-AF) 0.05 % cream, Apply 1 application  topically 2 (two) times daily. Apply to trunk, buttocks, legs for Psoriasis, Disp: , Rfl:    BD AUTOSHIELD DUO 30G X 5 MM MISC, , Disp: , Rfl:    fluticasone furoate-vilanterol (BREO ELLIPTA ) 100-25 MCG/ACT AEPB, Inhale 1 puff into the lungs daily., Disp: 180 each, Rfl: 5   fluticasone-salmeterol (ADVAIR) 250-50 MCG/ACT AEPB, Inhale 1 puff into the lungs 2 (two) times daily., Disp: , Rfl:    furosemide  (LASIX ) 20 MG tablet, Take 20 mg by mouth daily., Disp: , Rfl:    gabapentin  (NEURONTIN ) 300 MG capsule, Take 1 capsule in AM, 1 capsule in PM, Disp: 180 capsule, Rfl: 3   hydrALAZINE  (APRESOLINE ) 25 MG tablet, Take 1 tablet (25 mg total) by  mouth every 8 (eight) hours., Disp: , Rfl:    LANTUS  100 UNIT/ML injection, Inject 0.7 mLs (70 Units total) into the skin at bedtime., Disp: 60 mL, Rfl: 3   levothyroxine  (SYNTHROID ) 88 MCG tablet, Take 88 mcg by mouth daily before breakfast., Disp: , Rfl:    Multiple Vitamins-Minerals (MULTIVITAMIN WITH MINERALS) tablet, Take 1 tablet by mouth daily., Disp: , Rfl:    MYRBETRIQ  50 MG TB24 tablet, Take 1 tablet (50 mg total) by mouth daily., Disp: 30 tablet, Rfl: 11   NOVOLOG  100 UNIT/ML injection, Inject 14-20 Units into the skin 3 (three) times daily with meals. 90-150= 14 units; 151-200= 15 units; 201-250= 16 units; 251-300= 17 units; 301-350= 18 units; 351-400= 19 units; above 400 = 20 units, Disp: 60 mL, Rfl: 3   omeprazole (PRILOSEC) 20 MG capsule, Take 20 mg by mouth daily., Disp: , Rfl:    ondansetron  (ZOFRAN ) 8 MG tablet, Take 8 mg by mouth 2 (two) times daily., Disp: , Rfl:    OXYGEN , Inhale 2 L into the lungs continuous., Disp: , Rfl:    potassium chloride  SA (K-DUR) 20 MEQ tablet, Take 20 mEq by mouth daily., Disp: , Rfl:    saccharomyces boulardii (FLORASTOR) 250 MG capsule, Take 250 mg by mouth 2 (two) times daily., Disp: , Rfl:    sertraline  (ZOLOFT ) 100 MG tablet, Take 100 mg by mouth daily., Disp: , Rfl:    trimethoprim  (TRIMPEX ) 100 MG tablet, Take 1 tablet (100 mg total) by mouth daily., Disp: 30 tablet, Rfl: 11   Allergies: Allergies  Allergen Reactions   Carvedilol  Palpitations   Benicar [Olmesartan] Swelling   Codeine Other (See Comments)    Confusion    Sulfa Antibiotics Swelling  Whole face swells   Trulicity [Dulaglutide] Diarrhea   Ceftriaxone  Hives and Rash    REVIEW OF SYSTEMS:   Review of Systems  Constitutional:  Negative for chills, fatigue and fever.  HENT:   Negative for lump/mass, mouth sores, nosebleeds, sore throat and trouble swallowing.   Eyes:  Negative for eye problems.  Respiratory:  Negative for cough and shortness of breath.    Cardiovascular:  Negative for chest pain, leg swelling and palpitations.  Gastrointestinal:  Positive for constipation. Negative for abdominal pain, diarrhea, nausea and vomiting.  Genitourinary:  Negative for bladder incontinence, difficulty urinating, dysuria, frequency, hematuria and nocturia.   Musculoskeletal:  Negative for arthralgias, back pain, flank pain, myalgias and neck pain.  Skin:  Negative for itching and rash.  Neurological:  Positive for dizziness and numbness. Negative for headaches.  Hematological:  Does not bruise/bleed easily.  Psychiatric/Behavioral:  Negative for depression, sleep disturbance and suicidal ideas. The patient is not nervous/anxious.   All other systems reviewed and are negative.    VITALS:   Blood pressure (!) 156/63, pulse 74, temperature (!) 97 F (36.1 C), temperature source Tympanic, resp. rate 20, weight 289 lb 11 oz (131.4 kg), last menstrual period 08/06/2012, SpO2 96%.  Wt Readings from Last 3 Encounters:  05/14/24 289 lb 11 oz (131.4 kg)  05/01/24 283 lb (128.4 kg)  04/16/24 263 lb (119.3 kg)    Body mass index is 54.74 kg/m.  Performance status (ECOG): 2 - Symptomatic, <50% confined to bed  PHYSICAL EXAM:   Physical Exam Vitals and nursing note reviewed. Exam conducted with a chaperone present.  Constitutional:      Appearance: Normal appearance.  Cardiovascular:     Rate and Rhythm: Normal rate and regular rhythm.     Pulses: Normal pulses.     Heart sounds: Normal heart sounds.  Pulmonary:     Effort: Pulmonary effort is normal.     Breath sounds: Normal breath sounds.  Abdominal:     Palpations: Abdomen is soft. There is no hepatomegaly, splenomegaly or mass.     Tenderness: There is no abdominal tenderness.  Musculoskeletal:     Right lower leg: No edema.     Left lower leg: No edema.  Lymphadenopathy:     Cervical: No cervical adenopathy.     Right cervical: No superficial, deep or posterior cervical adenopathy.     Left cervical: No superficial, deep or posterior cervical adenopathy.     Upper Body:     Right upper body: No supraclavicular or axillary adenopathy.     Left upper body: No supraclavicular or axillary adenopathy.  Neurological:     General: No focal deficit present.     Mental Status: She is alert and oriented to person, place, and time.  Psychiatric:        Mood and Affect: Mood normal.        Behavior: Behavior normal.     LABS:   CBC     Component Value Date/Time   WBC 9.9 05/07/2024 1027   RBC 4.39 05/07/2024 1027   HGB 12.5 05/07/2024 1027   HCT 38.4 05/07/2024 1027   PLT 252 05/07/2024 1027   MCV 87.5 05/07/2024 1027   MCH 28.5 05/07/2024 1027   MCHC 32.6 05/07/2024 1027   RDW 13.7 05/07/2024 1027   LYMPHSABS 1.6 05/07/2024 1027   MONOABS 0.5 05/07/2024 1027   EOSABS 0.9 (H) 05/07/2024 1027   BASOSABS 0.1 05/07/2024 1027    CMP  Component Value Date/Time   NA 142 05/07/2024 1027   K 4.5 05/07/2024 1027   CL 102 05/07/2024 1027   CO2 29 05/07/2024 1027   GLUCOSE 85 05/07/2024 1027   BUN 30 (H) 05/07/2024 1027   CREATININE 1.73 (H) 05/07/2024 1027   CALCIUM  9.6 05/07/2024 1027   PROT 7.1 05/07/2024 1027   ALBUMIN 3.3 (L) 05/07/2024 1027   AST 17 05/07/2024 1027   ALT 16 05/07/2024 1027   ALKPHOS 111 05/07/2024 1027   BILITOT 0.5 05/07/2024 1027   GFRNONAA 33 (L) 05/07/2024 1027   GFRNONAA 36 02/10/2024 0943   GFRAA >60 12/26/2019 1530     Lab Results  Component Value Date   CEA1 1.9 05/07/2024   /  CEA  Date Value Ref Range Status  05/07/2024 1.9 0.0 - 4.7 ng/mL Final    Comment:    (NOTE)                             Nonsmokers          <3.9                             Smokers             <5.6 Roche [employer] Electrochemiluminescence Immunoassay (ECLIA) Values obtained with different assay methods or kits cannot be used interchangeably.  Results cannot be interpreted as absolute evidence of the presence or absence of malignant  disease. Performed At: [facility] [address] [place], [place] [id number] Pearlean Botts MD WU:[id number]    No results found for: "PSA1" No results found for: "703 209 3001" No results found for: "CAN125"  No results found for: "TOTALPROTELP", "ALBUMINELP", "A1GS", "A2GS", "BETS", "BETA2SER", "GAMS", "MSPIKE", "SPEI" Lab Results  Component Value Date   TIBC 225 (L) 05/04/2023   TIBC 257 01/25/2023   TIBC 257 10/13/2022   FERRITIN 47 05/04/2023   FERRITIN 44 01/25/2023   FERRITIN 18 10/13/2022   IRONPCTSAT 42 (H) 05/04/2023   IRONPCTSAT 27 01/25/2023   IRONPCTSAT 28 10/13/2022   No results found for: "LDH"   STUDIES:   No results found.

## 2024-05-14 NOTE — Patient Instructions (Addendum)
 [facility] at [facility] Discharge Instructions   You were seen and examined today by Dr. Cheree Cords.  He reviewed the results of your lab work which are normal/stable.   We will see you back in 6 months. We will repeat labs and a CT scan prior to this visit.   Return as scheduled.    Thank you for choosing [facility] at [facility] to provide your oncology and hematology care.  To afford each patient quality time with our provider, please arrive at least 15 minutes before your scheduled appointment time.   If you have a lab appointment with the Cancer Center please come in thru the Main Entrance and check in at the main information desk.  You need to re-schedule your appointment should you arrive 10 or more minutes late.  We strive to give you quality time with our providers, and arriving late affects you and other patients whose appointments are after yours.  Also, if you no show three or more times for appointments you may be dismissed from the clinic at the providers discretion.     Again, thank you for choosing [facility]  Our hope is that these requests will decrease the amount of time that you wait before being seen by our physicians.       _____________________________________________________________  Should you have questions after your visit to [facility], please contact our office at [phone] and follow the prompts.  Our office hours are 8:00 a.m. and 4:30 p.m. Monday - Friday.  Please note that voicemails left after 4:00 p.m. may not be returned until the following business day.  We are closed weekends and major holidays.  You do have access to a nurse 24-7, just call the main number to the clinic [phone] and do not press any options, hold on the line and a nurse will answer the phone.    For prescription refill requests, have your pharmacy contact our office and allow 72 hours.    Due to  Covid, you will need to wear a mask upon entering the hospital. If you do not have a mask, a mask will be given to you at the Main Entrance upon arrival. For doctor visits, patients may have 1 support person age 80 or older with them. For treatment visits, patients can not have anyone with them due to social distancing guidelines and our immunocompromised population.

## 2024-05-16 DIAGNOSIS — M79604 Pain in right leg: Secondary | ICD-10-CM | POA: Diagnosis not present

## 2024-05-25 DIAGNOSIS — E11621 Type 2 diabetes mellitus with foot ulcer: Secondary | ICD-10-CM | POA: Diagnosis not present

## 2024-06-07 DIAGNOSIS — Z79899 Other long term (current) drug therapy: Secondary | ICD-10-CM | POA: Diagnosis not present

## 2024-06-07 DIAGNOSIS — I10 Essential (primary) hypertension: Secondary | ICD-10-CM | POA: Diagnosis not present

## 2024-06-07 DIAGNOSIS — I5032 Chronic diastolic (congestive) heart failure: Secondary | ICD-10-CM | POA: Diagnosis not present

## 2024-06-07 DIAGNOSIS — K219 Gastro-esophageal reflux disease without esophagitis: Secondary | ICD-10-CM | POA: Diagnosis not present

## 2024-06-11 DIAGNOSIS — E103513 Type 1 diabetes mellitus with proliferative diabetic retinopathy with macular edema, bilateral: Secondary | ICD-10-CM | POA: Diagnosis not present

## 2024-06-11 DIAGNOSIS — H43823 Vitreomacular adhesion, bilateral: Secondary | ICD-10-CM | POA: Diagnosis not present

## 2024-06-11 DIAGNOSIS — H35033 Hypertensive retinopathy, bilateral: Secondary | ICD-10-CM | POA: Diagnosis not present

## 2024-06-11 DIAGNOSIS — Z961 Presence of intraocular lens: Secondary | ICD-10-CM | POA: Diagnosis not present

## 2024-06-18 DIAGNOSIS — N390 Urinary tract infection, site not specified: Secondary | ICD-10-CM | POA: Diagnosis not present

## 2024-06-21 DIAGNOSIS — Z79899 Other long term (current) drug therapy: Secondary | ICD-10-CM | POA: Diagnosis not present

## 2024-06-21 DIAGNOSIS — N390 Urinary tract infection, site not specified: Secondary | ICD-10-CM | POA: Diagnosis not present

## 2024-06-21 DIAGNOSIS — R300 Dysuria: Secondary | ICD-10-CM | POA: Diagnosis not present

## 2024-06-25 DIAGNOSIS — I158 Other secondary hypertension: Secondary | ICD-10-CM | POA: Diagnosis not present

## 2024-06-25 DIAGNOSIS — I130 Hypertensive heart and chronic kidney disease with heart failure and stage 1 through stage 4 chronic kidney disease, or unspecified chronic kidney disease: Secondary | ICD-10-CM | POA: Diagnosis not present

## 2024-06-25 DIAGNOSIS — E113511 Type 2 diabetes mellitus with proliferative diabetic retinopathy with macular edema, right eye: Secondary | ICD-10-CM | POA: Diagnosis not present

## 2024-06-25 DIAGNOSIS — Z79899 Other long term (current) drug therapy: Secondary | ICD-10-CM | POA: Diagnosis not present

## 2024-06-27 DIAGNOSIS — N1832 Chronic kidney disease, stage 3b: Secondary | ICD-10-CM | POA: Diagnosis not present

## 2024-06-27 DIAGNOSIS — E113599 Type 2 diabetes mellitus with proliferative diabetic retinopathy without macular edema, unspecified eye: Secondary | ICD-10-CM | POA: Diagnosis not present

## 2024-06-27 DIAGNOSIS — Z9889 Other specified postprocedural states: Secondary | ICD-10-CM | POA: Diagnosis not present

## 2024-06-27 DIAGNOSIS — E785 Hyperlipidemia, unspecified: Secondary | ICD-10-CM | POA: Diagnosis not present

## 2024-06-27 DIAGNOSIS — H1130 Conjunctival hemorrhage, unspecified eye: Secondary | ICD-10-CM | POA: Diagnosis not present

## 2024-06-27 DIAGNOSIS — D649 Anemia, unspecified: Secondary | ICD-10-CM | POA: Diagnosis not present

## 2024-06-27 DIAGNOSIS — E559 Vitamin D deficiency, unspecified: Secondary | ICD-10-CM | POA: Diagnosis not present

## 2024-06-27 DIAGNOSIS — E1165 Type 2 diabetes mellitus with hyperglycemia: Secondary | ICD-10-CM | POA: Diagnosis not present

## 2024-06-27 DIAGNOSIS — E039 Hypothyroidism, unspecified: Secondary | ICD-10-CM | POA: Diagnosis not present

## 2024-06-28 DIAGNOSIS — R269 Unspecified abnormalities of gait and mobility: Secondary | ICD-10-CM | POA: Diagnosis not present

## 2024-06-28 DIAGNOSIS — R2689 Other abnormalities of gait and mobility: Secondary | ICD-10-CM | POA: Diagnosis not present

## 2024-06-28 DIAGNOSIS — M6281 Muscle weakness (generalized): Secondary | ICD-10-CM | POA: Diagnosis not present

## 2024-06-28 DIAGNOSIS — E11621 Type 2 diabetes mellitus with foot ulcer: Secondary | ICD-10-CM | POA: Diagnosis not present

## 2024-06-28 DIAGNOSIS — R296 Repeated falls: Secondary | ICD-10-CM | POA: Diagnosis not present

## 2024-06-29 DIAGNOSIS — M6281 Muscle weakness (generalized): Secondary | ICD-10-CM | POA: Diagnosis not present

## 2024-06-29 DIAGNOSIS — R296 Repeated falls: Secondary | ICD-10-CM | POA: Diagnosis not present

## 2024-06-29 DIAGNOSIS — R2689 Other abnormalities of gait and mobility: Secondary | ICD-10-CM | POA: Diagnosis not present

## 2024-06-29 DIAGNOSIS — R269 Unspecified abnormalities of gait and mobility: Secondary | ICD-10-CM | POA: Diagnosis not present

## 2024-06-29 DIAGNOSIS — E11621 Type 2 diabetes mellitus with foot ulcer: Secondary | ICD-10-CM | POA: Diagnosis not present

## 2024-07-02 DIAGNOSIS — R296 Repeated falls: Secondary | ICD-10-CM | POA: Diagnosis not present

## 2024-07-02 DIAGNOSIS — M6281 Muscle weakness (generalized): Secondary | ICD-10-CM | POA: Diagnosis not present

## 2024-07-02 DIAGNOSIS — Z79899 Other long term (current) drug therapy: Secondary | ICD-10-CM | POA: Diagnosis not present

## 2024-07-02 DIAGNOSIS — R2689 Other abnormalities of gait and mobility: Secondary | ICD-10-CM | POA: Diagnosis not present

## 2024-07-02 DIAGNOSIS — J302 Other seasonal allergic rhinitis: Secondary | ICD-10-CM | POA: Diagnosis not present

## 2024-07-02 DIAGNOSIS — I5032 Chronic diastolic (congestive) heart failure: Secondary | ICD-10-CM | POA: Diagnosis not present

## 2024-07-02 DIAGNOSIS — J449 Chronic obstructive pulmonary disease, unspecified: Secondary | ICD-10-CM | POA: Diagnosis not present

## 2024-07-02 DIAGNOSIS — E11621 Type 2 diabetes mellitus with foot ulcer: Secondary | ICD-10-CM | POA: Diagnosis not present

## 2024-07-02 DIAGNOSIS — R269 Unspecified abnormalities of gait and mobility: Secondary | ICD-10-CM | POA: Diagnosis not present

## 2024-07-03 DIAGNOSIS — E113512 Type 2 diabetes mellitus with proliferative diabetic retinopathy with macular edema, left eye: Secondary | ICD-10-CM | POA: Diagnosis not present

## 2024-07-04 DIAGNOSIS — R296 Repeated falls: Secondary | ICD-10-CM | POA: Diagnosis not present

## 2024-07-04 DIAGNOSIS — R2689 Other abnormalities of gait and mobility: Secondary | ICD-10-CM | POA: Diagnosis not present

## 2024-07-04 DIAGNOSIS — M6281 Muscle weakness (generalized): Secondary | ICD-10-CM | POA: Diagnosis not present

## 2024-07-04 DIAGNOSIS — E11621 Type 2 diabetes mellitus with foot ulcer: Secondary | ICD-10-CM | POA: Diagnosis not present

## 2024-07-04 DIAGNOSIS — R269 Unspecified abnormalities of gait and mobility: Secondary | ICD-10-CM | POA: Diagnosis not present

## 2024-07-06 DIAGNOSIS — R269 Unspecified abnormalities of gait and mobility: Secondary | ICD-10-CM | POA: Diagnosis not present

## 2024-07-06 DIAGNOSIS — E11621 Type 2 diabetes mellitus with foot ulcer: Secondary | ICD-10-CM | POA: Diagnosis not present

## 2024-07-06 DIAGNOSIS — R2689 Other abnormalities of gait and mobility: Secondary | ICD-10-CM | POA: Diagnosis not present

## 2024-07-06 DIAGNOSIS — M6281 Muscle weakness (generalized): Secondary | ICD-10-CM | POA: Diagnosis not present

## 2024-07-06 DIAGNOSIS — R296 Repeated falls: Secondary | ICD-10-CM | POA: Diagnosis not present

## 2024-07-09 DIAGNOSIS — R269 Unspecified abnormalities of gait and mobility: Secondary | ICD-10-CM | POA: Diagnosis not present

## 2024-07-09 DIAGNOSIS — M6281 Muscle weakness (generalized): Secondary | ICD-10-CM | POA: Diagnosis not present

## 2024-07-09 DIAGNOSIS — R296 Repeated falls: Secondary | ICD-10-CM | POA: Diagnosis not present

## 2024-07-09 DIAGNOSIS — E11621 Type 2 diabetes mellitus with foot ulcer: Secondary | ICD-10-CM | POA: Diagnosis not present

## 2024-07-09 DIAGNOSIS — R2689 Other abnormalities of gait and mobility: Secondary | ICD-10-CM | POA: Diagnosis not present

## 2024-07-10 DIAGNOSIS — M6281 Muscle weakness (generalized): Secondary | ICD-10-CM | POA: Diagnosis not present

## 2024-07-10 DIAGNOSIS — R296 Repeated falls: Secondary | ICD-10-CM | POA: Diagnosis not present

## 2024-07-10 DIAGNOSIS — E11621 Type 2 diabetes mellitus with foot ulcer: Secondary | ICD-10-CM | POA: Diagnosis not present

## 2024-07-10 DIAGNOSIS — R2689 Other abnormalities of gait and mobility: Secondary | ICD-10-CM | POA: Diagnosis not present

## 2024-07-10 DIAGNOSIS — R269 Unspecified abnormalities of gait and mobility: Secondary | ICD-10-CM | POA: Diagnosis not present

## 2024-07-11 DIAGNOSIS — R296 Repeated falls: Secondary | ICD-10-CM | POA: Diagnosis not present

## 2024-07-11 DIAGNOSIS — R2689 Other abnormalities of gait and mobility: Secondary | ICD-10-CM | POA: Diagnosis not present

## 2024-07-11 DIAGNOSIS — M6281 Muscle weakness (generalized): Secondary | ICD-10-CM | POA: Diagnosis not present

## 2024-07-11 DIAGNOSIS — R269 Unspecified abnormalities of gait and mobility: Secondary | ICD-10-CM | POA: Diagnosis not present

## 2024-07-11 DIAGNOSIS — E11621 Type 2 diabetes mellitus with foot ulcer: Secondary | ICD-10-CM | POA: Diagnosis not present

## 2024-07-12 DIAGNOSIS — R2689 Other abnormalities of gait and mobility: Secondary | ICD-10-CM | POA: Diagnosis not present

## 2024-07-12 DIAGNOSIS — E11621 Type 2 diabetes mellitus with foot ulcer: Secondary | ICD-10-CM | POA: Diagnosis not present

## 2024-07-12 DIAGNOSIS — M6281 Muscle weakness (generalized): Secondary | ICD-10-CM | POA: Diagnosis not present

## 2024-07-12 DIAGNOSIS — R269 Unspecified abnormalities of gait and mobility: Secondary | ICD-10-CM | POA: Diagnosis not present

## 2024-07-12 DIAGNOSIS — R296 Repeated falls: Secondary | ICD-10-CM | POA: Diagnosis not present

## 2024-07-13 DIAGNOSIS — R2689 Other abnormalities of gait and mobility: Secondary | ICD-10-CM | POA: Diagnosis not present

## 2024-07-13 DIAGNOSIS — M6281 Muscle weakness (generalized): Secondary | ICD-10-CM | POA: Diagnosis not present

## 2024-07-13 DIAGNOSIS — R269 Unspecified abnormalities of gait and mobility: Secondary | ICD-10-CM | POA: Diagnosis not present

## 2024-07-13 DIAGNOSIS — R296 Repeated falls: Secondary | ICD-10-CM | POA: Diagnosis not present

## 2024-07-13 DIAGNOSIS — E11621 Type 2 diabetes mellitus with foot ulcer: Secondary | ICD-10-CM | POA: Diagnosis not present

## 2024-07-16 DIAGNOSIS — E11621 Type 2 diabetes mellitus with foot ulcer: Secondary | ICD-10-CM | POA: Diagnosis not present

## 2024-07-16 DIAGNOSIS — R2689 Other abnormalities of gait and mobility: Secondary | ICD-10-CM | POA: Diagnosis not present

## 2024-07-16 DIAGNOSIS — R296 Repeated falls: Secondary | ICD-10-CM | POA: Diagnosis not present

## 2024-07-16 DIAGNOSIS — R269 Unspecified abnormalities of gait and mobility: Secondary | ICD-10-CM | POA: Diagnosis not present

## 2024-07-16 DIAGNOSIS — M6281 Muscle weakness (generalized): Secondary | ICD-10-CM | POA: Diagnosis not present

## 2024-07-17 DIAGNOSIS — R296 Repeated falls: Secondary | ICD-10-CM | POA: Diagnosis not present

## 2024-07-17 DIAGNOSIS — E11621 Type 2 diabetes mellitus with foot ulcer: Secondary | ICD-10-CM | POA: Diagnosis not present

## 2024-07-17 DIAGNOSIS — R269 Unspecified abnormalities of gait and mobility: Secondary | ICD-10-CM | POA: Diagnosis not present

## 2024-07-17 DIAGNOSIS — M6281 Muscle weakness (generalized): Secondary | ICD-10-CM | POA: Diagnosis not present

## 2024-07-17 DIAGNOSIS — R2689 Other abnormalities of gait and mobility: Secondary | ICD-10-CM | POA: Diagnosis not present

## 2024-07-18 DIAGNOSIS — R296 Repeated falls: Secondary | ICD-10-CM | POA: Diagnosis not present

## 2024-07-18 DIAGNOSIS — R269 Unspecified abnormalities of gait and mobility: Secondary | ICD-10-CM | POA: Diagnosis not present

## 2024-07-18 DIAGNOSIS — M6281 Muscle weakness (generalized): Secondary | ICD-10-CM | POA: Diagnosis not present

## 2024-07-18 DIAGNOSIS — R2689 Other abnormalities of gait and mobility: Secondary | ICD-10-CM | POA: Diagnosis not present

## 2024-07-18 DIAGNOSIS — E11621 Type 2 diabetes mellitus with foot ulcer: Secondary | ICD-10-CM | POA: Diagnosis not present

## 2024-07-19 DIAGNOSIS — R296 Repeated falls: Secondary | ICD-10-CM | POA: Diagnosis not present

## 2024-07-19 DIAGNOSIS — R269 Unspecified abnormalities of gait and mobility: Secondary | ICD-10-CM | POA: Diagnosis not present

## 2024-07-19 DIAGNOSIS — E11621 Type 2 diabetes mellitus with foot ulcer: Secondary | ICD-10-CM | POA: Diagnosis not present

## 2024-07-19 DIAGNOSIS — R2689 Other abnormalities of gait and mobility: Secondary | ICD-10-CM | POA: Diagnosis not present

## 2024-07-19 DIAGNOSIS — M6281 Muscle weakness (generalized): Secondary | ICD-10-CM | POA: Diagnosis not present

## 2024-07-20 DIAGNOSIS — R2689 Other abnormalities of gait and mobility: Secondary | ICD-10-CM | POA: Diagnosis not present

## 2024-07-20 DIAGNOSIS — E11621 Type 2 diabetes mellitus with foot ulcer: Secondary | ICD-10-CM | POA: Diagnosis not present

## 2024-07-20 DIAGNOSIS — R296 Repeated falls: Secondary | ICD-10-CM | POA: Diagnosis not present

## 2024-07-20 DIAGNOSIS — M6281 Muscle weakness (generalized): Secondary | ICD-10-CM | POA: Diagnosis not present

## 2024-07-20 DIAGNOSIS — R269 Unspecified abnormalities of gait and mobility: Secondary | ICD-10-CM | POA: Diagnosis not present

## 2024-07-23 DIAGNOSIS — G5603 Carpal tunnel syndrome, bilateral upper limbs: Secondary | ICD-10-CM | POA: Diagnosis not present

## 2024-07-23 DIAGNOSIS — M6281 Muscle weakness (generalized): Secondary | ICD-10-CM | POA: Diagnosis not present

## 2024-07-23 DIAGNOSIS — R2689 Other abnormalities of gait and mobility: Secondary | ICD-10-CM | POA: Diagnosis not present

## 2024-07-23 DIAGNOSIS — R296 Repeated falls: Secondary | ICD-10-CM | POA: Diagnosis not present

## 2024-07-23 DIAGNOSIS — E11621 Type 2 diabetes mellitus with foot ulcer: Secondary | ICD-10-CM | POA: Diagnosis not present

## 2024-07-23 DIAGNOSIS — R269 Unspecified abnormalities of gait and mobility: Secondary | ICD-10-CM | POA: Diagnosis not present

## 2024-07-24 ENCOUNTER — Ambulatory Visit (INDEPENDENT_AMBULATORY_CARE_PROVIDER_SITE_OTHER): Admitting: Nurse Practitioner

## 2024-07-24 ENCOUNTER — Encounter: Payer: Self-pay | Admitting: Nurse Practitioner

## 2024-07-24 VITALS — BP 114/68 | HR 81 | Ht 61.0 in | Wt 289.0 lb

## 2024-07-24 DIAGNOSIS — E11621 Type 2 diabetes mellitus with foot ulcer: Secondary | ICD-10-CM | POA: Diagnosis not present

## 2024-07-24 DIAGNOSIS — Z794 Long term (current) use of insulin: Secondary | ICD-10-CM

## 2024-07-24 DIAGNOSIS — I10 Essential (primary) hypertension: Secondary | ICD-10-CM

## 2024-07-24 DIAGNOSIS — M6281 Muscle weakness (generalized): Secondary | ICD-10-CM | POA: Diagnosis not present

## 2024-07-24 DIAGNOSIS — E1165 Type 2 diabetes mellitus with hyperglycemia: Secondary | ICD-10-CM | POA: Diagnosis not present

## 2024-07-24 DIAGNOSIS — R2689 Other abnormalities of gait and mobility: Secondary | ICD-10-CM | POA: Diagnosis not present

## 2024-07-24 DIAGNOSIS — R296 Repeated falls: Secondary | ICD-10-CM | POA: Diagnosis not present

## 2024-07-24 DIAGNOSIS — R269 Unspecified abnormalities of gait and mobility: Secondary | ICD-10-CM | POA: Diagnosis not present

## 2024-07-24 DIAGNOSIS — E782 Mixed hyperlipidemia: Secondary | ICD-10-CM

## 2024-07-24 NOTE — Progress Notes (Signed)
 Endocrinology Follow Up Note       07/24/2024, 10:18 AM   Subjective:    Patient ID: [name], female    DOB: 08/15/1961.  [name] is being seen in follow up after being seen in consultation for management of currently uncontrolled symptomatic diabetes requested by  Baird Comer GAILS, NP.   Past Medical History:  Diagnosis Date   Anemia    Arthritis    Asthma    Cancer of sigmoid (HCC)    COPD (chronic obstructive pulmonary disease) (HCC)    Depression    Diabetes mellitus    Diastolic dysfunction 05/15/2015   Dyspnea    Generalized weakness    Hyperlipidemia    Hypertension    Hypothyroidism    Obesity    Palpitations    Pneumonia    PONV (postoperative nausea and vomiting)    Prolonged QT interval 05/14/2015   Sleep apnea     Past Surgical History:  Procedure Laterality Date   BIOPSY  04/06/2022   Procedure: BIOPSY;  Surgeon: Eartha Angelia Sieving, MD;  [facility];  Service: Gastroenterology;;   CATARACT EXTRACTION     CESAREAN SECTION     CHOLECYSTECTOMY     COLONOSCOPY WITH PROPOFOL  N/A 04/06/2022   Procedure: COLONOSCOPY WITH PROPOFOL ;  Surgeon: Eartha Angelia Sieving, MD;  Location: [facility];  Service: Gastroenterology;  Laterality: N/A;  805 ASA 2 patient in [facility]   CYSTOSCOPY WITH INJECTION N/A 02/16/2024   Procedure: CYSTOSCOPY WITH INJECTION- Bladder botox  100 units;  Surgeon: Sherrilee Belvie CROME, MD;  Location: [facility];  Service: Urology;  Laterality: N/A;   ESOPHAGOGASTRODUODENOSCOPY (EGD) WITH PROPOFOL  N/A 04/06/2022   Procedure: ESOPHAGOGASTRODUODENOSCOPY (EGD) WITH PROPOFOL ;  Surgeon: Eartha Angelia Sieving, MD;  [facility];  Service: Gastroenterology;  Laterality: N/A;   HEMOSTASIS CLIP PLACEMENT  04/06/2022   Procedure: HEMOSTASIS CLIP PLACEMENT;  Surgeon: Eartha Angelia Sieving, MD;  [facility];  Service: Gastroenterology;;   POLYPECTOMY  04/06/2022   Procedure: POLYPECTOMY;  Surgeon: Eartha Angelia, Sieving, MD;  [facility];  Service: Gastroenterology;;   SUBMUCOSAL TATTOO INJECTION  04/06/2022   Procedure: SUBMUCOSAL TATTOO INJECTION;  Surgeon: Eartha Angelia, Sieving, MD;  [facility]: [facility];  Service: Gastroenterology;;    Social History   Socioeconomic History   Marital status: Married    Spouse name: Not on file   Number of children: Not on file   Years of education: Not on file   Highest education level: Not on file  Occupational History   Not on file  Tobacco Use   Smoking status: Never    Passive exposure: Yes   Smokeless tobacco: Never  Vaping Use   Vaping status: Never Used  Substance and Sexual Activity   Alcohol use: No    Alcohol/week: 0.0 standard drinks of alcohol   Drug use: No   Sexual activity: Yes    Birth control/protection: None  Other Topics Concern   Not on file  Social History Narrative   Lives with husband.  One living son.  Daughter died after transplant age 47.     Are you right  handed or left handed? Right   Are you currently employed ? 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    What type of home do you live in: 1 story or 2 story? one       Social Drivers of Corporate investment banker Strain: Medium Risk (08/14/2021)   Received from [facility]   Overall Financial Resource Strain (CARDIA)    Difficulty of Paying Living Expenses: Somewhat hard  Food Insecurity: No Food Insecurity (11/12/2023)   Hunger Vital Sign    Worried About Running Out of Food in the Last Year: Never true    Ran Out of Food in the Last Year: Never true  Transportation Needs: No Transportation Needs (11/12/2023)   PRAPARE - Administrator, Civil Service (Medical): No    Lack of Transportation (Non-Medical): No  Physical Activity: Inactive (08/14/2021)   Received  from [facility]   Exercise Vital Sign    On average, how many days per week do you engage in moderate to strenuous exercise (like a brisk walk)?: 0 days    On average, how many minutes do you engage in exercise at this level?: 0 min  Stress: Stress Concern Present (08/14/2021)   Received from [facility] - Occupational Stress Questionnaire    Feeling of Stress : Very much  Social Connections: Unknown (04/08/2022)   [facility]   Social Network    Social Network: Not on file    Family History  Problem Relation Age of Onset   Stroke Mother    Diabetes Father    Heart failure Father    Hypertension Father    Diabetes Sister    Heart failure Sister    Hypertension Sister    Stroke Sister    Cancer Other    Stroke Sister     Outpatient Encounter Medications as of 07/24/2024  Medication Sig   albuterol  (VENTOLIN  HFA) 108 (90 Base) MCG/ACT inhaler Inhale 2 puffs into the lungs every 6 (six) hours as needed for wheezing or shortness of breath.   alum & mag hydroxide-simeth (MAALOX/MYLANTA) 200-200-20 MG/5ML suspension Take 30 mLs by mouth every 2 (two) hours as needed for indigestion or heartburn. Do not exceed 4 doses in 24 hours   Amino Acids-Protein Hydrolys (FEEDING SUPPLEMENT, PRO-STAT 64,) LIQD Take 30 mLs by mouth daily.   amLODipine  (NORVASC ) 10 MG tablet Take 10 mg by mouth daily.   ascorbic acid  (VITAMIN C ) 500 MG tablet Take 500 mg by mouth 2 (two) times daily.   aspirin  EC 81 MG tablet Take 81 mg by mouth daily. Swallow whole.   atorvastatin  (LIPITOR) 10 MG tablet Take 10 mg by mouth at bedtime.   augmented betamethasone dipropionate (DIPROLENE-AF) 0.05 % cream Apply 1 application  topically 2 (two) times daily. Apply to trunk, buttocks, legs for Psoriasis   BD AUTOSHIELD DUO 30G X 5 MM MISC    cetirizine (ZYRTEC) 10 MG tablet Take 10 mg by mouth daily.   Cholecalciferol (VITAMIN D) 50 MCG (2000 UT) CAPS Take 2,000  Units by mouth daily.   clotrimazole-betamethasone (LOTRISONE) cream Apply topically.   Cranberry 300 MG tablet Take 300 mg by mouth 2 (two) times daily.   docusate sodium  (COLACE) 100 MG capsule Take 100 mg by mouth daily.   estradiol  (ESTRACE ) 0.1 MG/GM vaginal cream Discard plastic applicator. Insert a blueberry  size amount (approximately 1 gram) of cream on fingertip inside vagina at bedtime every night for 1 week then every other night routinely (for long term use).   fluticasone furoate-vilanterol (BREO ELLIPTA ) 100-25 MCG/ACT AEPB Inhale 1 puff into the lungs daily.   fluticasone-salmeterol (ADVAIR) 250-50 MCG/ACT AEPB Inhale 1 puff into the lungs 2 (two) times daily.   furosemide  (LASIX ) 20 MG tablet Take 20 mg by mouth daily.   gabapentin  (NEURONTIN ) 300 MG capsule Take 1 capsule in AM, 1 capsule in PM   hydrALAZINE  (APRESOLINE ) 25 MG tablet Take 1 tablet (25 mg total) by mouth every 8 (eight) hours.   LANTUS  100 UNIT/ML injection Inject 0.7 mLs (70 Units total) into the skin at bedtime.   levothyroxine  (SYNTHROID ) 88 MCG tablet Take 88 mcg by mouth daily before breakfast.   Menthol-Zinc  Oxide (CALMOSEPTINE) 0.44-20.6 % OINT Apply 1 Application topically daily as needed (preservation/protection after incontinent care).   Multiple Vitamins-Minerals (MULTIVITAMIN WITH MINERALS) tablet Take 1 tablet by mouth daily.   NOVOLOG  100 UNIT/ML injection Inject 14-20 Units into the skin 3 (three) times daily with meals. 90-150= 14 units; 151-200= 15 units; 201-250= 16 units; 251-300= 17 units; 301-350= 18 units; 351-400= 19 units; above 400 = 20 units   omeprazole (PRILOSEC) 20 MG capsule Take 20 mg by mouth daily.   ondansetron  (ZOFRAN ) 8 MG tablet Take 8 mg by mouth 2 (two) times daily.   OXYGEN  Inhale 2 L into the lungs continuous.   potassium chloride  SA (K-DUR) 20 MEQ tablet Take 20 mEq by mouth daily.   saccharomyces boulardii (FLORASTOR) 250 MG capsule Take 250 mg by mouth 2 (two) times  daily.   sertraline  (ZOLOFT ) 100 MG tablet Take 100 mg by mouth daily.   trimethoprim  (TRIMPEX ) 100 MG tablet Take 1 tablet (100 mg total) by mouth daily.   Biotin 10 MG TABS Take 10 mg by mouth daily. (Patient not taking: Reported on 07/24/2024)   MYRBETRIQ  50 MG TB24 tablet Take 1 tablet (50 mg total) by mouth daily. (Patient not taking: Reported on 07/24/2024)   No facility-administered encounter medications on file as of 07/24/2024.    ALLERGIES: Allergies  Allergen Reactions   Carvedilol  Palpitations   Benicar [Olmesartan] Swelling   Codeine Other (See Comments)    Confusion    Sulfa Antibiotics Swelling    Whole face swells   Trulicity [Dulaglutide] Diarrhea   Ceftriaxone  Hives and Rash    VACCINATION STATUS: Immunization History  Administered Date(s) Administered   Influenza,inj,quad, With Preservative 09/24/2015   Pneumococcal Conjugate-13 09/02/2015   Pneumococcal Polysaccharide-23 10/01/2005   Tdap 10/01/2005, 09/02/2015    Diabetes She presents for her follow-up diabetic visit. She has type 2 diabetes mellitus. Onset time: diagnosed approx 50 years ago per husband report. Her disease course has been fluctuating. There are no hypoglycemic associated symptoms. (She did have one episode of hypoglycemia since last visit, no record of it on logs) Associated symptoms include blurred vision, fatigue, foot paresthesias, polydipsia and polyuria. Pertinent negatives for diabetes include no weight loss. There are no hypoglycemic complications. Symptoms are stable. Diabetic complications include a CVA (TIA in past), nephropathy, peripheral neuropathy and PVD. Risk factors for coronary artery disease include diabetes mellitus, dyslipidemia, family history, obesity, hypertension and sedentary lifestyle. Current diabetic treatment includes intensive insulin  program. She is compliant with treatment most of the time (lives in SNF). She is following a generally unhealthy diet. When asked about  meal planning, she reported none. She has not had a  previous visit with a dietitian. She rarely participates in exercise. Her home blood glucose trend is fluctuating dramatically. Her breakfast blood glucose range is generally >200 mg/dl. Her lunch blood glucose range is generally 90-110 mg/dl. Her dinner blood glucose range is generally >200 mg/dl. Her bedtime blood glucose range is generally >200 mg/dl. Her overall blood glucose range is >200 mg/dl. (She presents today, accompanied by her husband, with logs showing dramatically fluctuating glycemic profile overall.  Her most recent A1c, checked on 7/25 was 8.6%, increasing slightly from last A1c of 8.2%.  She notes she does not like the food from the NH, thus will have outside food brought in- finds herself late night snacking contributing to high bed time readings.  She admits to consuming diet, or SF products as she thought they were healthier still.  She did also recently start PT.  She does have some mild hypoglycemia noted, mainly at lunch time ? Related to increased activity? ) An ACE inhibitor/angiotensin II receptor blocker is not being taken. She sees a podiatrist.Eye exam is current.     Review of systems  Constitutional: + minimally fluctuating body weight, current Body mass index is 54.61 kg/m., no fatigue, no subjective hyperthermia, no subjective hypothermia Eyes: no blurry vision, no xerophthalmia ENT: no sore throat, no nodules palpated in throat, no dysphagia/odynophagia, no hoarseness Cardiovascular: no chest pain, no shortness of breath, no palpitations, no leg swelling Respiratory: no cough, no shortness of breath Gastrointestinal: no nausea/vomiting/diarrhea Musculoskeletal: essentially WC bound due to deconditioning Skin: no rashes, no hyperemia Neurological: no tremors, no numbness, no tingling, no dizziness Psychiatric: no depression, no anxiety  Objective:     BP 114/68 (BP Location: Left Arm, Patient Position:  Sitting, Cuff Size: Large)   Pulse 81   Ht 5' 1 (1.549 m)   Wt 289 lb (131.1 kg) Comment: patient reported weight, wheel chair.  LMP 08/06/2012   BMI 54.61 kg/m   Wt Readings from Last 3 Encounters:  07/24/24 289 lb (131.1 kg)  05/14/24 289 lb 11 oz (131.4 kg)  05/01/24 283 lb (128.4 kg)     BP Readings from Last 3 Encounters:  07/24/24 114/68  05/14/24 (!) 156/63  05/01/24 (!) 148/70     Physical Exam- Limited  Constitutional:  Body mass index is 54.61 kg/m. , not in acute distress, normal state of mind Eyes:  EOMI, no exophthalmos Musculoskeletal: essentially WC bound due to deconditioning, right foot in ortho boot Skin:  no rashes, no hyperemia, healing DM ulcer to right heel- sees wound care and getting treatment at the NH Neurological: no tremor with outstretched hands    CMP ( most recent) CMP     Component Value Date/Time   NA 142 05/07/2024 1027   K 4.5 05/07/2024 1027   CL 102 05/07/2024 1027   CO2 29 05/07/2024 1027   GLUCOSE 85 05/07/2024 1027   BUN 30 (H) 05/07/2024 1027   CREATININE 1.73 (H) 05/07/2024 1027   CALCIUM  9.6 05/07/2024 1027   PROT 7.1 05/07/2024 1027   ALBUMIN 3.3 (L) 05/07/2024 1027   AST 17 05/07/2024 1027   ALT 16 05/07/2024 1027   ALKPHOS 111 05/07/2024 1027   BILITOT 0.5 05/07/2024 1027   GFRNONAA 33 (L) 05/07/2024 1027   GFRNONAA 36 02/10/2024 0943     Diabetic Labs (most recent): Lab Results  Component Value Date   HGBA1C 8.2 (H) 11/13/2023   HGBA1C 8.5 (H) 06/27/2023   HGBA1C 7.2 (H) 08/03/2022     Lipid  Panel ( most recent) Lipid Panel  No results found for: CHOL, TRIG, HDL, CHOLHDL, VLDL, LDLCALC, LDLDIRECT, LABVLDL             Assessment & Plan:   1) Type 2 diabetes mellitus with hyperglycemia, with long-term current use of insulin  (HCC)  She presents today, accompanied by her husband, with logs (had to wait for facility to fax them) showing dramatically fluctuating glycemic profile  overall.  Her most recent A1c, checked on 7/25 was 8.6%, increasing slightly from last A1c of 8.2%.  She notes she does not like the food from the NH, thus will have outside food brought in- finds herself late night snacking contributing to high bed time readings.  She admits to consuming diet, or SF products as she thought they were healthier still.  She did also recently start PT.  She does have some mild hypoglycemia noted, mainly at lunch time ? Related to increased activity?   [name] has currently uncontrolled symptomatic type 2 DM since her 37s.   -Recent labs reviewed.  - I had a long discussion with her about the progressive nature of diabetes and the pathology behind its complications. -her diabetes is complicated by TIA, CKD, PVD, neuropathy and she remains at a high risk for more acute and chronic complications which include CAD, CVA, CKD, retinopathy, and neuropathy. These are all discussed in detail with her.  The following Lifestyle Medicine recommendations according to American College of Lifestyle Medicine [facility]) were discussed and offered to patient and she agrees to start the journey:  A. Whole Foods, Plant-based plate comprising of fruits and vegetables, plant-based proteins, whole-grain carbohydrates was discussed in detail with the patient.   A list for source of those nutrients were also provided to the patient.  Patient will use only water  or unsweetened tea for hydration. B.  The need to stay away from risky substances including alcohol, smoking; obtaining 7 to 9 hours of restorative sleep, at least 150 minutes of moderate intensity exercise weekly, the importance of healthy social connections,  and stress reduction techniques were discussed. C.  A full color page of  Calorie density of various food groups per pound showing examples of each food groups was provided to the patient.  - Nutritional counseling repeated at each appointment due to patients tendency to  fall back in to old habits.  - The patient admits there is a room for improvement in their diet and drink choices. -  Suggestion is made for the patient to avoid simple carbohydrates from their diet including Cakes, Sweet Desserts / Pastries, Ice Cream, Soda (diet and regular), Sweet Tea, Candies, Chips, Cookies, Sweet Pastries, Store Bought Juices, Alcohol in Excess of 1-2 drinks a day, Artificial Sweeteners, Coffee Creamer, and Sugar-free Products. This will help patient to have stable blood glucose profile and potentially avoid unintended weight gain.   - I encouraged the patient to switch to unprocessed or minimally processed complex starch and increased protein intake (animal or plant source), fruits, and vegetables.   - Patient is advised to stick to a routine mealtimes to eat 3 meals a day and avoid unnecessary snacks (to snack only to correct hypoglycemia).  - I have approached her with the following individualized plan to manage her diabetes and patient agrees:   -She is advised to continue Lantus  70 units SQ nightly and continue Novolog  14-20 units TID with meals if glucose is above 90 and she is eating (Specific instructions on how to titrate  insulin  dosage based on glucose readings given to patient in writing).  We talked in great length today about cutting out the snacking at night and she promises to do better.  -she is encouraged to continue monitoring glucose 4 times daily, before meals and before bed, and to call the clinic if she has readings less than 70 or above 300 for 3 tests in a row.    - she is warned not to take insulin  without proper monitoring per orders. - Adjustment parameters are given to her for hypo and hyperglycemia in writing.  - she is not candidate for Trulicity, did not tolerate well in the past, nor Sulfa meds as she is allergic.  She is also not a candidate for Metformin given stage 3 renal failure.  - Specific targets for  A1c; LDL, HDL, and  Triglycerides were discussed with the patient.  2) Blood Pressure /Hypertension:  her blood pressure is controlled to target.   she is advised to continue her current medications as prescribed by her PCP.  3) Lipids/Hyperlipidemia:    There is no recent lipid panel available to review .  she is advised to continue Lipitor 10 mg daily at bedtime.  Side effects and precautions discussed with her.  4)  Weight/Diet:  her Body mass index is 54.61 kg/m.  -  clearly complicating her diabetes care.   she is a candidate for weight loss. I discussed with her the fact that loss of 5 - 10% of her  current body weight will have the most impact on her diabetes management.  Exercise, and detailed carbohydrates information provided  -  detailed on discharge instructions.  5) Chronic Care/Health Maintenance: -she is not on ACEI/ARB and is on Statin medications and is encouraged to initiate and continue to follow up with Ophthalmology, Dentist, Podiatrist at least yearly or according to recommendations, and advised to stay away from smoking. I have recommended yearly flu vaccine and pneumonia vaccine at least every 5 years; moderate intensity exercise for up to 150 minutes weekly; and sleep for at least 7 hours a day.  - she is advised to maintain close follow up with [name], Comer GAILS, NP for primary care needs, as well as her other providers for optimal and coordinated care.     I spent  57  minutes in the care of the patient today including review of labs from CMP, Lipids, Thyroid  Function, Hematology (current and previous including abstractions from other facilities); face-to-face time discussing  her blood glucose readings/logs, discussing hypoglycemia and hyperglycemia episodes and symptoms, medications doses, her options of short and long term treatment based on the latest standards of care / guidelines;  discussion about incorporating lifestyle medicine;  and documenting the encounter. Risk reduction  counseling performed per USPSTF guidelines to reduce obesity and cardiovascular risk factors.     Please refer to Patient Instructions for Blood Glucose Monitoring and Insulin /Medications Dosing Guide  in media tab for additional information. Please  also refer to  Patient Self Inventory in the Media  tab for reviewed elements of pertinent patient history.  Teresa NOVAK Stell participated in the discussions, expressed understanding, and voiced agreement with the above plans.  All questions were answered to her satisfaction. she is encouraged to contact clinic should she have any questions or concerns prior to her return visit.     Follow up plan: - Return in about 3 months (around 10/24/2024) for Diabetes F/U with A1c in office, No previsit labs, Bring meter and  logs.   Benton Rio, [facility] [address] [place], [place] [address] Phone: [phone] Fax: [phone]  07/24/2024, 10:18 AM

## 2024-07-25 DIAGNOSIS — R296 Repeated falls: Secondary | ICD-10-CM | POA: Diagnosis not present

## 2024-07-25 DIAGNOSIS — R2689 Other abnormalities of gait and mobility: Secondary | ICD-10-CM | POA: Diagnosis not present

## 2024-07-25 DIAGNOSIS — M6281 Muscle weakness (generalized): Secondary | ICD-10-CM | POA: Diagnosis not present

## 2024-07-25 DIAGNOSIS — E11621 Type 2 diabetes mellitus with foot ulcer: Secondary | ICD-10-CM | POA: Diagnosis not present

## 2024-07-25 DIAGNOSIS — R269 Unspecified abnormalities of gait and mobility: Secondary | ICD-10-CM | POA: Diagnosis not present

## 2024-07-26 DIAGNOSIS — R2689 Other abnormalities of gait and mobility: Secondary | ICD-10-CM | POA: Diagnosis not present

## 2024-07-26 DIAGNOSIS — E11621 Type 2 diabetes mellitus with foot ulcer: Secondary | ICD-10-CM | POA: Diagnosis not present

## 2024-07-26 DIAGNOSIS — R296 Repeated falls: Secondary | ICD-10-CM | POA: Diagnosis not present

## 2024-07-26 DIAGNOSIS — M6281 Muscle weakness (generalized): Secondary | ICD-10-CM | POA: Diagnosis not present

## 2024-07-26 DIAGNOSIS — R269 Unspecified abnormalities of gait and mobility: Secondary | ICD-10-CM | POA: Diagnosis not present

## 2024-07-27 DIAGNOSIS — M6281 Muscle weakness (generalized): Secondary | ICD-10-CM | POA: Diagnosis not present

## 2024-07-27 DIAGNOSIS — R296 Repeated falls: Secondary | ICD-10-CM | POA: Diagnosis not present

## 2024-07-27 DIAGNOSIS — R269 Unspecified abnormalities of gait and mobility: Secondary | ICD-10-CM | POA: Diagnosis not present

## 2024-07-27 DIAGNOSIS — E11621 Type 2 diabetes mellitus with foot ulcer: Secondary | ICD-10-CM | POA: Diagnosis not present

## 2024-07-27 DIAGNOSIS — R2689 Other abnormalities of gait and mobility: Secondary | ICD-10-CM | POA: Diagnosis not present

## 2024-07-29 DIAGNOSIS — R296 Repeated falls: Secondary | ICD-10-CM | POA: Diagnosis not present

## 2024-07-29 DIAGNOSIS — M6281 Muscle weakness (generalized): Secondary | ICD-10-CM | POA: Diagnosis not present

## 2024-07-29 DIAGNOSIS — R269 Unspecified abnormalities of gait and mobility: Secondary | ICD-10-CM | POA: Diagnosis not present

## 2024-07-29 DIAGNOSIS — R2689 Other abnormalities of gait and mobility: Secondary | ICD-10-CM | POA: Diagnosis not present

## 2024-07-29 DIAGNOSIS — E11621 Type 2 diabetes mellitus with foot ulcer: Secondary | ICD-10-CM | POA: Diagnosis not present

## 2024-07-30 DIAGNOSIS — R296 Repeated falls: Secondary | ICD-10-CM | POA: Diagnosis not present

## 2024-07-30 DIAGNOSIS — R2689 Other abnormalities of gait and mobility: Secondary | ICD-10-CM | POA: Diagnosis not present

## 2024-07-30 DIAGNOSIS — E113511 Type 2 diabetes mellitus with proliferative diabetic retinopathy with macular edema, right eye: Secondary | ICD-10-CM | POA: Diagnosis not present

## 2024-07-30 DIAGNOSIS — E11621 Type 2 diabetes mellitus with foot ulcer: Secondary | ICD-10-CM | POA: Diagnosis not present

## 2024-07-30 DIAGNOSIS — M6281 Muscle weakness (generalized): Secondary | ICD-10-CM | POA: Diagnosis not present

## 2024-07-30 DIAGNOSIS — R269 Unspecified abnormalities of gait and mobility: Secondary | ICD-10-CM | POA: Diagnosis not present

## 2024-07-31 DIAGNOSIS — R296 Repeated falls: Secondary | ICD-10-CM | POA: Diagnosis not present

## 2024-07-31 DIAGNOSIS — M6281 Muscle weakness (generalized): Secondary | ICD-10-CM | POA: Diagnosis not present

## 2024-07-31 DIAGNOSIS — R269 Unspecified abnormalities of gait and mobility: Secondary | ICD-10-CM | POA: Diagnosis not present

## 2024-07-31 DIAGNOSIS — E11621 Type 2 diabetes mellitus with foot ulcer: Secondary | ICD-10-CM | POA: Diagnosis not present

## 2024-07-31 DIAGNOSIS — R2689 Other abnormalities of gait and mobility: Secondary | ICD-10-CM | POA: Diagnosis not present

## 2024-08-01 DIAGNOSIS — E782 Mixed hyperlipidemia: Secondary | ICD-10-CM | POA: Diagnosis not present

## 2024-08-01 DIAGNOSIS — I5089 Other heart failure: Secondary | ICD-10-CM | POA: Diagnosis not present

## 2024-08-02 DIAGNOSIS — R269 Unspecified abnormalities of gait and mobility: Secondary | ICD-10-CM | POA: Diagnosis not present

## 2024-08-02 DIAGNOSIS — R2689 Other abnormalities of gait and mobility: Secondary | ICD-10-CM | POA: Diagnosis not present

## 2024-08-02 DIAGNOSIS — R296 Repeated falls: Secondary | ICD-10-CM | POA: Diagnosis not present

## 2024-08-02 DIAGNOSIS — M6281 Muscle weakness (generalized): Secondary | ICD-10-CM | POA: Diagnosis not present

## 2024-08-02 DIAGNOSIS — E11621 Type 2 diabetes mellitus with foot ulcer: Secondary | ICD-10-CM | POA: Diagnosis not present

## 2024-08-03 DIAGNOSIS — R296 Repeated falls: Secondary | ICD-10-CM | POA: Diagnosis not present

## 2024-08-03 DIAGNOSIS — M6281 Muscle weakness (generalized): Secondary | ICD-10-CM | POA: Diagnosis not present

## 2024-08-03 DIAGNOSIS — E11621 Type 2 diabetes mellitus with foot ulcer: Secondary | ICD-10-CM | POA: Diagnosis not present

## 2024-08-03 DIAGNOSIS — R2689 Other abnormalities of gait and mobility: Secondary | ICD-10-CM | POA: Diagnosis not present

## 2024-08-03 DIAGNOSIS — R269 Unspecified abnormalities of gait and mobility: Secondary | ICD-10-CM | POA: Diagnosis not present

## 2024-08-05 DIAGNOSIS — E11621 Type 2 diabetes mellitus with foot ulcer: Secondary | ICD-10-CM | POA: Diagnosis not present

## 2024-08-05 DIAGNOSIS — R296 Repeated falls: Secondary | ICD-10-CM | POA: Diagnosis not present

## 2024-08-05 DIAGNOSIS — R2689 Other abnormalities of gait and mobility: Secondary | ICD-10-CM | POA: Diagnosis not present

## 2024-08-05 DIAGNOSIS — R059 Cough, unspecified: Secondary | ICD-10-CM | POA: Diagnosis not present

## 2024-08-05 DIAGNOSIS — R0989 Other specified symptoms and signs involving the circulatory and respiratory systems: Secondary | ICD-10-CM | POA: Diagnosis not present

## 2024-08-05 DIAGNOSIS — R269 Unspecified abnormalities of gait and mobility: Secondary | ICD-10-CM | POA: Diagnosis not present

## 2024-08-05 DIAGNOSIS — M6281 Muscle weakness (generalized): Secondary | ICD-10-CM | POA: Diagnosis not present

## 2024-08-07 DIAGNOSIS — R296 Repeated falls: Secondary | ICD-10-CM | POA: Diagnosis not present

## 2024-08-07 DIAGNOSIS — R269 Unspecified abnormalities of gait and mobility: Secondary | ICD-10-CM | POA: Diagnosis not present

## 2024-08-07 DIAGNOSIS — Z79899 Other long term (current) drug therapy: Secondary | ICD-10-CM | POA: Diagnosis not present

## 2024-08-07 DIAGNOSIS — I5032 Chronic diastolic (congestive) heart failure: Secondary | ICD-10-CM | POA: Diagnosis not present

## 2024-08-07 DIAGNOSIS — R2689 Other abnormalities of gait and mobility: Secondary | ICD-10-CM | POA: Diagnosis not present

## 2024-08-07 DIAGNOSIS — E11621 Type 2 diabetes mellitus with foot ulcer: Secondary | ICD-10-CM | POA: Diagnosis not present

## 2024-08-07 DIAGNOSIS — M6281 Muscle weakness (generalized): Secondary | ICD-10-CM | POA: Diagnosis not present

## 2024-08-08 DIAGNOSIS — R296 Repeated falls: Secondary | ICD-10-CM | POA: Diagnosis not present

## 2024-08-08 DIAGNOSIS — R2689 Other abnormalities of gait and mobility: Secondary | ICD-10-CM | POA: Diagnosis not present

## 2024-08-08 DIAGNOSIS — M6281 Muscle weakness (generalized): Secondary | ICD-10-CM | POA: Diagnosis not present

## 2024-08-08 DIAGNOSIS — E11621 Type 2 diabetes mellitus with foot ulcer: Secondary | ICD-10-CM | POA: Diagnosis not present

## 2024-08-08 DIAGNOSIS — R269 Unspecified abnormalities of gait and mobility: Secondary | ICD-10-CM | POA: Diagnosis not present

## 2024-08-09 DIAGNOSIS — R2689 Other abnormalities of gait and mobility: Secondary | ICD-10-CM | POA: Diagnosis not present

## 2024-08-09 DIAGNOSIS — M6281 Muscle weakness (generalized): Secondary | ICD-10-CM | POA: Diagnosis not present

## 2024-08-09 DIAGNOSIS — R269 Unspecified abnormalities of gait and mobility: Secondary | ICD-10-CM | POA: Diagnosis not present

## 2024-08-09 DIAGNOSIS — R296 Repeated falls: Secondary | ICD-10-CM | POA: Diagnosis not present

## 2024-08-10 DIAGNOSIS — M6281 Muscle weakness (generalized): Secondary | ICD-10-CM | POA: Diagnosis not present

## 2024-08-10 DIAGNOSIS — R296 Repeated falls: Secondary | ICD-10-CM | POA: Diagnosis not present

## 2024-08-10 DIAGNOSIS — R2689 Other abnormalities of gait and mobility: Secondary | ICD-10-CM | POA: Diagnosis not present

## 2024-08-10 DIAGNOSIS — R269 Unspecified abnormalities of gait and mobility: Secondary | ICD-10-CM | POA: Diagnosis not present

## 2024-08-13 DIAGNOSIS — I739 Peripheral vascular disease, unspecified: Secondary | ICD-10-CM | POA: Diagnosis not present

## 2024-08-13 DIAGNOSIS — M21371 Foot drop, right foot: Secondary | ICD-10-CM | POA: Diagnosis not present

## 2024-08-13 DIAGNOSIS — E1151 Type 2 diabetes mellitus with diabetic peripheral angiopathy without gangrene: Secondary | ICD-10-CM | POA: Diagnosis not present

## 2024-08-13 DIAGNOSIS — L602 Onychogryphosis: Secondary | ICD-10-CM | POA: Diagnosis not present

## 2024-08-13 DIAGNOSIS — L603 Nail dystrophy: Secondary | ICD-10-CM | POA: Diagnosis not present

## 2024-08-20 DIAGNOSIS — E113512 Type 2 diabetes mellitus with proliferative diabetic retinopathy with macular edema, left eye: Secondary | ICD-10-CM | POA: Diagnosis not present

## 2024-08-21 DIAGNOSIS — G5601 Carpal tunnel syndrome, right upper limb: Secondary | ICD-10-CM | POA: Diagnosis not present

## 2024-08-21 DIAGNOSIS — M65931 Unspecified synovitis and tenosynovitis, right forearm: Secondary | ICD-10-CM | POA: Diagnosis not present

## 2024-08-21 DIAGNOSIS — J449 Chronic obstructive pulmonary disease, unspecified: Secondary | ICD-10-CM | POA: Diagnosis not present

## 2024-08-23 DIAGNOSIS — G5601 Carpal tunnel syndrome, right upper limb: Secondary | ICD-10-CM | POA: Diagnosis not present

## 2024-08-23 DIAGNOSIS — R52 Pain, unspecified: Secondary | ICD-10-CM | POA: Diagnosis not present

## 2024-08-23 DIAGNOSIS — Z79899 Other long term (current) drug therapy: Secondary | ICD-10-CM | POA: Diagnosis not present

## 2024-08-31 DIAGNOSIS — G5601 Carpal tunnel syndrome, right upper limb: Secondary | ICD-10-CM | POA: Diagnosis not present

## 2024-09-03 ENCOUNTER — Ambulatory Visit: Admitting: Urology

## 2024-09-03 DIAGNOSIS — E1122 Type 2 diabetes mellitus with diabetic chronic kidney disease: Secondary | ICD-10-CM | POA: Diagnosis not present

## 2024-09-03 DIAGNOSIS — N1832 Chronic kidney disease, stage 3b: Secondary | ICD-10-CM | POA: Diagnosis not present

## 2024-09-03 DIAGNOSIS — I10 Essential (primary) hypertension: Secondary | ICD-10-CM | POA: Diagnosis not present

## 2024-09-03 DIAGNOSIS — Z79899 Other long term (current) drug therapy: Secondary | ICD-10-CM | POA: Diagnosis not present

## 2024-09-03 DIAGNOSIS — R339 Retention of urine, unspecified: Secondary | ICD-10-CM | POA: Diagnosis not present

## 2024-09-03 DIAGNOSIS — R600 Localized edema: Secondary | ICD-10-CM | POA: Diagnosis not present

## 2024-09-03 DIAGNOSIS — I5032 Chronic diastolic (congestive) heart failure: Secondary | ICD-10-CM | POA: Diagnosis not present

## 2024-09-03 DIAGNOSIS — R0602 Shortness of breath: Secondary | ICD-10-CM | POA: Diagnosis not present

## 2024-09-03 DIAGNOSIS — I5089 Other heart failure: Secondary | ICD-10-CM | POA: Diagnosis not present

## 2024-09-04 DIAGNOSIS — M79605 Pain in left leg: Secondary | ICD-10-CM | POA: Diagnosis not present

## 2024-09-04 DIAGNOSIS — M79604 Pain in right leg: Secondary | ICD-10-CM | POA: Diagnosis not present

## 2024-09-05 DIAGNOSIS — R0989 Other specified symptoms and signs involving the circulatory and respiratory systems: Secondary | ICD-10-CM | POA: Diagnosis not present

## 2024-09-05 DIAGNOSIS — R0602 Shortness of breath: Secondary | ICD-10-CM | POA: Diagnosis not present

## 2024-09-06 DIAGNOSIS — M6281 Muscle weakness (generalized): Secondary | ICD-10-CM | POA: Diagnosis not present

## 2024-09-07 ENCOUNTER — Other Ambulatory Visit: Payer: Self-pay

## 2024-09-07 ENCOUNTER — Emergency Department (HOSPITAL_COMMUNITY)

## 2024-09-07 ENCOUNTER — Emergency Department (HOSPITAL_COMMUNITY)
Admission: EM | Admit: 2024-09-07 | Discharge: 2024-09-08 | Disposition: A | Discharge status: Home / Self Care | Attending: Emergency Medicine | Admitting: Emergency Medicine

## 2024-09-07 ENCOUNTER — Encounter (HOSPITAL_COMMUNITY): Payer: Self-pay | Admitting: Emergency Medicine

## 2024-09-07 DIAGNOSIS — J811 Chronic pulmonary edema: Secondary | ICD-10-CM | POA: Diagnosis not present

## 2024-09-07 DIAGNOSIS — Z794 Long term (current) use of insulin: Secondary | ICD-10-CM | POA: Insufficient documentation

## 2024-09-07 DIAGNOSIS — E119 Type 2 diabetes mellitus without complications: Secondary | ICD-10-CM | POA: Insufficient documentation

## 2024-09-07 DIAGNOSIS — Z85048 Personal history of other malignant neoplasm of rectum, rectosigmoid junction, and anus: Secondary | ICD-10-CM | POA: Insufficient documentation

## 2024-09-07 DIAGNOSIS — I110 Hypertensive heart disease with heart failure: Secondary | ICD-10-CM | POA: Diagnosis not present

## 2024-09-07 DIAGNOSIS — Z79899 Other long term (current) drug therapy: Secondary | ICD-10-CM | POA: Diagnosis not present

## 2024-09-07 DIAGNOSIS — N179 Acute kidney failure, unspecified: Secondary | ICD-10-CM | POA: Diagnosis not present

## 2024-09-07 DIAGNOSIS — E039 Hypothyroidism, unspecified: Secondary | ICD-10-CM | POA: Insufficient documentation

## 2024-09-07 DIAGNOSIS — I509 Heart failure, unspecified: Secondary | ICD-10-CM | POA: Diagnosis not present

## 2024-09-07 DIAGNOSIS — K5900 Constipation, unspecified: Secondary | ICD-10-CM | POA: Diagnosis not present

## 2024-09-07 DIAGNOSIS — Z7982 Long term (current) use of aspirin: Secondary | ICD-10-CM | POA: Diagnosis not present

## 2024-09-07 DIAGNOSIS — Z7951 Long term (current) use of inhaled steroids: Secondary | ICD-10-CM | POA: Insufficient documentation

## 2024-09-07 DIAGNOSIS — Z743 Need for continuous supervision: Secondary | ICD-10-CM | POA: Diagnosis not present

## 2024-09-07 DIAGNOSIS — J449 Chronic obstructive pulmonary disease, unspecified: Secondary | ICD-10-CM | POA: Insufficient documentation

## 2024-09-07 DIAGNOSIS — R0602 Shortness of breath: Secondary | ICD-10-CM | POA: Diagnosis not present

## 2024-09-07 DIAGNOSIS — I517 Cardiomegaly: Secondary | ICD-10-CM | POA: Diagnosis not present

## 2024-09-07 LAB — CBC WITH DIFFERENTIAL/PLATELET
Abs Immature Granulocytes: 0.06 K/uL (ref 0.00–0.07)
Basophils Absolute: 0.1 K/uL (ref 0.0–0.1)
Basophils Relative: 1 %
Eosinophils Absolute: 0.2 K/uL (ref 0.0–0.5)
Eosinophils Relative: 2 %
HCT: 32.9 % — ABNORMAL LOW (ref 36.0–46.0)
Hemoglobin: 10.5 g/dL — ABNORMAL LOW (ref 12.0–15.0)
Immature Granulocytes: 1 %
Lymphocytes Relative: 12 %
Lymphs Abs: 1.2 K/uL (ref 0.7–4.0)
MCH: 27.9 pg (ref 26.0–34.0)
MCHC: 31.9 g/dL (ref 30.0–36.0)
MCV: 87.5 fL (ref 80.0–100.0)
Monocytes Absolute: 0.5 K/uL (ref 0.1–1.0)
Monocytes Relative: 5 %
Neutro Abs: 8.2 K/uL — ABNORMAL HIGH (ref 1.7–7.7)
Neutrophils Relative %: 79 %
Platelets: 297 K/uL (ref 150–400)
RBC: 3.76 MIL/uL — ABNORMAL LOW (ref 3.87–5.11)
RDW: 14.5 % (ref 11.5–15.5)
WBC: 10.1 K/uL (ref 4.0–10.5)
nRBC: 0 % (ref 0.0–0.2)

## 2024-09-07 LAB — RESP PANEL BY RT-PCR (RSV, FLU A&B, COVID)  RVPGX2
Influenza A by PCR: NEGATIVE
Influenza B by PCR: NEGATIVE
Resp Syncytial Virus by PCR: NEGATIVE
SARS Coronavirus 2 by RT PCR: NEGATIVE

## 2024-09-07 LAB — TROPONIN T, HIGH SENSITIVITY
Troponin T High Sensitivity: 82 ng/L — ABNORMAL HIGH (ref 0–19)
Troponin T High Sensitivity: 79 ng/L — ABNORMAL HIGH (ref 0–19)

## 2024-09-07 LAB — BASIC METABOLIC PANEL WITH GFR
Anion gap: 12 (ref 5–15)
BUN: 47 mg/dL — ABNORMAL HIGH (ref 8–23)
CO2: 27 mmol/L (ref 22–32)
Calcium: 9.7 mg/dL (ref 8.9–10.3)
Chloride: 100 mmol/L (ref 98–111)
Creatinine, Ser: 2.23 mg/dL — ABNORMAL HIGH (ref 0.44–1.00)
GFR, Estimated: 24 mL/min — ABNORMAL LOW (ref >60)
Glucose, Bld: 383 mg/dL — ABNORMAL HIGH (ref 70–99)
Potassium: 4.4 mmol/L (ref 3.5–5.1)
Sodium: 139 mmol/L (ref 135–145)

## 2024-09-07 LAB — PRO BRAIN NATRIURETIC PEPTIDE: Pro Brain Natriuretic Peptide: 832 pg/mL — ABNORMAL HIGH (ref <300.0)

## 2024-09-07 MED ORDER — METHYLPREDNISOLONE SODIUM SUCC 125 MG IJ SOLR
125.0000 mg | Freq: Once | INTRAMUSCULAR | Status: AC
  Administered 2024-09-07: 125 mg via INTRAVENOUS
  Filled 2024-09-07: qty 2

## 2024-09-07 MED ORDER — FUROSEMIDE 10 MG/ML IJ SOLN
40.0000 mg | Freq: Once | INTRAMUSCULAR | Status: AC
  Administered 2024-09-07: 40 mg via INTRAVENOUS
  Filled 2024-09-07: qty 4

## 2024-09-07 MED ORDER — IPRATROPIUM-ALBUTEROL 0.5-2.5 (3) MG/3ML IN SOLN
3.0000 mL | Freq: Once | RESPIRATORY_TRACT | Status: AC
  Administered 2024-09-07: 3 mL via RESPIRATORY_TRACT
  Filled 2024-09-07: qty 3

## 2024-09-07 NOTE — ED Provider Notes (Signed)
 [place] [facility] AT [facility] Provider Note  CSN: [id number] Arrival date & time: 09/07/24 1728  Chief Complaint(s) Shortness of Breath  HPI [name] is a 63 y.o. female with past medical history as below, significant for COPD on 3 L nasal cannula, CHF, asthma, DM, obesity, wheelchair dependent, who presents to the ED with complaint of dyspnea  Patient reports has been feeling unwell over the past 3 days, worsening dyspnea.  She has not had to increase her typical nasal cannula oxygen  amount.  She has been using her home inhaler with mild improvement to her symptoms.  No significant change to her cough.  No fevers.  Unsure about sick contacts but does live in a nursing facility.  Reports mild reduced appetite but no nausea or vomiting, no change in bowel or bladder function.  Reports compliance with her regular medications.  Does not feel her legs are more swollen than normal.  Denies orthopnea.  She is accompanied by spouse  Past Medical History Past Medical History:  Diagnosis Date   Anemia    Arthritis    Asthma    Cancer of sigmoid (HCC)    COPD (chronic obstructive pulmonary disease) (HCC)    Depression    Diabetes mellitus    Diastolic dysfunction 05/15/2015   Dyspnea    Generalized weakness    Hyperlipidemia    Hypertension    Hypothyroidism    Obesity    Palpitations    Pneumonia    PONV (postoperative nausea and vomiting)    Prolonged QT interval 05/14/2015   Sleep apnea    Patient Active Problem List   Diagnosis Date Noted   Lobar pneumonia 11/16/2023   Acute on chronic respiratory failure with hypoxia and hypercapnia (HCC) 11/16/2023   Encephalopathy acute 11/12/2023   Acquired renal cyst of right kidney 08/04/2023   Mixed stress and urge incontinence 08/04/2023   Ambulatory dysfunction 08/04/2023   Urinary urgency 08/04/2023   Urge incontinence 08/04/2023   Urinary frequency 08/04/2023   Cognitive communication deficit  08/04/2023   Overactive bladder 08/02/2023   Recurrent UTI 08/02/2023   Left carpal tunnel syndrome 05/27/2023   Right carpal tunnel syndrome 05/27/2023   Polyneuropathy associated with underlying disease 09/20/2022   Sensorineural hearing loss (SNHL) of both ears 09/20/2022   Colon cancer (HCC) 08/11/2022   Cancer of sigmoid colon (HCC) 05/12/2022   Iron deficiency anemia    Pressure injury of skin 11/24/2021   Chronic venous stasis 11/22/2021   Leukocytosis 11/22/2021   Thrombocytosis 11/22/2021   Hyperglycemia due to diabetes mellitus (HCC) 11/22/2021   Lactic acidosis 11/22/2021   Hypoalbuminemia due to protein-calorie malnutrition 11/22/2021   Elevated liver enzymes 11/22/2021   Mixed hyperlipidemia 11/22/2021   GERD (gastroesophageal reflux disease) 11/22/2021   Acute on chronic diastolic CHF (congestive heart failure) (HCC) 11/22/2021   Diabetic foot infection (HCC) 11/22/2021   Uncontrolled type 2 diabetes mellitus with hyperglycemia, with long-term current use of insulin  (HCC) 11/22/2021   Lower extremity cellulitis 11/21/2021   Class 3 severe obesity with serious comorbidity and body mass index (BMI) of 50.0 to 59.9 in adult (HCC) 07/10/2021   Morbid obesity with BMI of 50.0-59.9, adult (HCC) 01/29/2020   OSA (obstructive sleep apnea) 12/21/2019   Asthma 12/21/2019   Current moderate episode of major depressive disorder (HCC) 01/16/2019   Stress incontinence of urine 01/16/2019   Irritable bowel syndrome with diarrhea 02/21/2018   Uterine prolapse 08/09/2017   Acquired hypothyroidism 09/27/2016  Anxiety, generalized 12/03/2015   Hypertension associated with diabetes (HCC) 08/26/2015   Diastolic dysfunction 05/15/2015   Prolonged QT interval 05/14/2015   Palpitations 07/23/2014   Generalized weakness 07/23/2014   Essential hypertension 07/22/2014   COPD (chronic obstructive pulmonary disease) (HCC) 07/22/2014   Depression 07/22/2014   Home Medication(s) Prior to  Admission medications   Medication Sig Start Date End Date Taking? Authorizing Provider  albuterol  (VENTOLIN  HFA) 108 (90 Base) MCG/ACT inhaler Inhale 2 puffs into the lungs every 6 (six) hours as needed for wheezing or shortness of breath.    [provider]  alum & mag hydroxide-simeth (MAALOX/MYLANTA) 200-200-20 MG/5ML suspension Take 30 mLs by mouth every 2 (two) hours as needed for indigestion or heartburn. Do not exceed 4 doses in 24 hours    [provider]  Amino Acids-Protein Hydrolys (FEEDING SUPPLEMENT, PRO-STAT 64,) LIQD Take 30 mLs by mouth daily.    [provider]  amLODipine  (NORVASC ) 10 MG tablet Take 10 mg by mouth daily. 12/30/23   [provider]  ascorbic acid  (VITAMIN C ) 500 MG tablet Take 500 mg by mouth 2 (two) times daily.    [provider]  aspirin  EC 81 MG tablet Take 81 mg by mouth daily. Swallow whole.    [provider]  atorvastatin  (LIPITOR) 10 MG tablet Take 10 mg by mouth at bedtime. 09/29/21   [provider]  augmented betamethasone dipropionate (DIPROLENE-AF) 0.05 % cream Apply 1 application  topically 2 (two) times daily. Apply to trunk, buttocks, legs for Psoriasis    [provider]  BD AUTOSHIELD DUO 30G X 5 MM MISC  12/31/23   [provider]  Biotin 10 MG TABS Take 10 mg by mouth daily. Patient not taking: Reported on 07/24/2024    [provider]  cetirizine (ZYRTEC) 10 MG tablet Take 10 mg by mouth daily.    [provider]  Cholecalciferol (VITAMIN D) 50 MCG (2000 UT) CAPS Take 2,000 Units by mouth daily.    [provider]  clotrimazole-betamethasone (LOTRISONE) cream Apply topically. 03/29/24   [provider]  Cranberry 300 MG tablet Take 300 mg by mouth 2 (two) times daily.    [provider]  docusate sodium  (COLACE) 100 MG capsule Take 100 mg by mouth daily.    [provider]  estradiol  (ESTRACE ) 0.1 MG/GM vaginal  cream Discard plastic applicator. Insert a blueberry size amount (approximately 1 gram) of cream on fingertip inside vagina at bedtime every night for 1 week then every other night routinely (for long term use). 09/09/23   Gerldine Lauraine BROCKS, FNP  fluticasone furoate-vilanterol (BREO ELLIPTA ) 100-25 MCG/ACT AEPB Inhale 1 puff into the lungs daily. 07/02/23   Pearlean Manus, MD  fluticasone-salmeterol (ADVAIR) 250-50 MCG/ACT AEPB Inhale 1 puff into the lungs 2 (two) times daily. 12/24/23   [provider]  furosemide  (LASIX ) 20 MG tablet Take 20 mg by mouth daily. 03/29/22   [provider]  gabapentin  (NEURONTIN ) 300 MG capsule Take 1 capsule in AM, 1 capsule in PM 09/30/23   Georjean Darice HERO, MD  hydrALAZINE  (APRESOLINE ) 25 MG tablet Take 1 tablet (25 mg total) by mouth every 8 (eight) hours. 11/26/21   Johnson, Clanford L, MD  LANTUS  100 UNIT/ML injection Inject 0.7 mLs (70 Units total) into the skin at bedtime. 04/16/24   Therisa Benton PARAS, NP  levothyroxine  (SYNTHROID ) 88 MCG tablet Take 88 mcg by mouth daily before breakfast.    [provider]  Menthol-Zinc  Oxide (CALMOSEPTINE) 0.44-20.6 % OINT Apply 1 Application topically daily as needed (preservation/protection after incontinent care).    [provider]  Multiple Vitamins-Minerals (MULTIVITAMIN WITH MINERALS) tablet Take 1 tablet by mouth daily.    [provider]  MYRBETRIQ  50 MG TB24 tablet Take 1 tablet (50 mg total) by mouth daily. Patient not taking: Reported on 07/24/2024 08/04/23   Gerldine Lauraine BROCKS, FNP  NOVOLOG  100 UNIT/ML injection Inject 14-20 Units into the skin 3 (three) times daily with meals. 90-150= 14 units; 151-200= 15 units; 201-250= 16 units; 251-300= 17 units; 301-350= 18 units; 351-400= 19 units; above 400 = 20 units 04/16/24   Therisa Benton PARAS, NP  omeprazole (PRILOSEC) 20 MG capsule Take 20 mg by mouth daily.    [provider]  ondansetron  (ZOFRAN ) 8 MG tablet Take 8 mg  by mouth 2 (two) times daily. 12/09/23   [provider]  OXYGEN  Inhale 2 L into the lungs continuous.    [provider]  potassium chloride  SA (K-DUR) 20 MEQ tablet Take 20 mEq by mouth daily.    [provider]  saccharomyces boulardii (FLORASTOR) 250 MG capsule Take 250 mg by mouth 2 (two) times daily.    [provider]  sertraline  (ZOLOFT ) 100 MG tablet Take 100 mg by mouth daily. 03/31/23   [provider]  trimethoprim  (TRIMPEX ) 100 MG tablet Take 1 tablet (100 mg total) by mouth daily. 09/09/23   Gerldine Lauraine BROCKS, FNP                                                                                                                                    Past Surgical History Past Surgical History:  Procedure Laterality Date   BIOPSY  04/06/2022   Procedure: BIOPSY;  Surgeon: Eartha Angelia Sieving, MD;  [facility]: [facility];  Service: Gastroenterology;;   CATARACT EXTRACTION     CESAREAN SECTION     CHOLECYSTECTOMY     COLONOSCOPY WITH PROPOFOL  N/A 04/06/2022   Procedure: COLONOSCOPY WITH PROPOFOL ;  Surgeon: Eartha Angelia Sieving, MD;  Location: [facility];  Service: Gastroenterology;  Laterality: N/A;  805 ASA 2 patient in [facility]   CYSTOSCOPY WITH INJECTION N/A 02/16/2024   Procedure: CYSTOSCOPY WITH INJECTION- Bladder botox  100 units;  Surgeon: Sherrilee Belvie CROME, MD;  Location: [facility];  Service: Urology;  Laterality: N/A;   ESOPHAGOGASTRODUODENOSCOPY (EGD) WITH PROPOFOL  N/A 04/06/2022   Procedure: ESOPHAGOGASTRODUODENOSCOPY (EGD) WITH PROPOFOL ;  Surgeon: Eartha Angelia Sieving, MD;  Location: [facility];  Service: Gastroenterology;  Laterality: N/A;   HEMOSTASIS CLIP PLACEMENT  04/06/2022   Procedure: HEMOSTASIS CLIP PLACEMENT;  Surgeon: Eartha Angelia Sieving, MD;  Location: [facility];  Service: Gastroenterology;;   POLYPECTOMY  04/06/2022   Procedure: POLYPECTOMY;  Surgeon: Eartha Angelia Sieving, MD;   Location: [facility];  Service: Gastroenterology;;   SUBMUCOSAL TATTOO INJECTION  04/06/2022  Procedure: SUBMUCOSAL TATTOO INJECTION;  Surgeon: Eartha Flavors, Toribio, MD;  Location: [facility];  Service: Gastroenterology;;   Family History Family History  Problem Relation Age of Onset   Stroke Mother    Diabetes Father    Heart failure Father    Hypertension Father    Diabetes Sister    Heart failure Sister    Hypertension Sister    Stroke Sister    Cancer Other    Stroke Sister     Social History Social History   Tobacco Use   Smoking status: Never    Passive exposure: Yes   Smokeless tobacco: Never  Vaping Use   Vaping status: Never Used  Substance Use Topics   Alcohol use: No    Alcohol/week: 0.0 standard drinks of alcohol   Drug use: No   Allergies Carvedilol , Benicar [olmesartan], Codeine, Sulfa antibiotics, Trulicity [dulaglutide], and Ceftriaxone   Review of Systems A thorough review of systems was obtained and all systems are negative except as noted in the HPI and PMH.   Physical Exam Vital Signs  I have reviewed the triage vital signs BP (!) 158/78   Pulse 77   Temp 98 F (36.7 C) (Oral)   Resp 16   Ht 5' 1 (1.549 m)   Wt (!) 138.3 kg   LMP 08/06/2012   SpO2 96%   BMI 57.63 kg/m  Physical Exam Vitals and nursing note reviewed.  Constitutional:      General: She is not in acute distress.    Appearance: Normal appearance. She is obese.  HENT:     Head: Normocephalic and atraumatic.     Right Ear: External ear normal.     Left Ear: External ear normal.     Nose: Nose normal.     Mouth/Throat:     Mouth: Mucous membranes are moist.  Eyes:     General: No scleral icterus.       Right eye: No discharge.        Left eye: No discharge.  Cardiovascular:     Rate and Rhythm: Normal rate and regular rhythm.     Pulses: Normal pulses.     Heart sounds: Normal heart sounds.  Pulmonary:     Effort: Pulmonary effort is normal. No  respiratory distress.     Breath sounds: No stridor. Decreased breath sounds and wheezing present.  Abdominal:     General: Abdomen is flat. There is no distension.     Palpations: Abdomen is soft.     Tenderness: There is no abdominal tenderness. There is no guarding or rebound.  Musculoskeletal:     Cervical back: No rigidity.     Right lower leg: Edema present.     Left lower leg: Edema present.  Skin:    General: Skin is warm and dry.     Capillary Refill: Capillary refill takes less than 2 seconds.  Neurological:     Mental Status: She is alert.  Psychiatric:        Mood and Affect: Mood normal.        Behavior: Behavior normal. Behavior is cooperative.     ED Results and Treatments Labs (all labs ordered are listed, but only abnormal results are displayed) Labs Reviewed  BASIC METABOLIC PANEL WITH GFR - Abnormal; Notable for the following components:      Result Value   Glucose, Bld 383 (*)    BUN 47 (*)    Creatinine, Ser 2.23 (*)    GFR,  Estimated 24 (*)    All other components within normal limits  PRO BRAIN NATRIURETIC PEPTIDE - Abnormal; Notable for the following components:   Pro Brain Natriuretic Peptide 832.0 (*)    All other components within normal limits  CBC WITH DIFFERENTIAL/PLATELET - Abnormal; Notable for the following components:   RBC 3.76 (*)    Hemoglobin 10.5 (*)    HCT 32.9 (*)    Neutro Abs 8.2 (*)    All other components within normal limits  TROPONIN T, HIGH SENSITIVITY - Abnormal; Notable for the following components:   Troponin T High Sensitivity 82 (*)    All other components within normal limits  TROPONIN T, HIGH SENSITIVITY - Abnormal; Notable for the following components:   Troponin T High Sensitivity 79 (*)    All other components within normal limits  RESP PANEL BY RT-PCR (RSV, FLU A&B, COVID)  [id number]                                                                                                                           Radiology DG Chest Port 1 View Result Date: 09/07/2024 CLINICAL DATA:  Shortness of breath. EXAM: PORTABLE CHEST 1 VIEW COMPARISON:  11/12/2023 FINDINGS: The heart is enlarged. Mediastinal contours are stable. Diffuse pulmonary edema. No pneumothorax. No obvious pleural effusion, soft tissue attenuation from habitus limits assessment. No pneumothorax. IMPRESSION: Cardiomegaly with pulmonary edema. Electronically Signed   By: Andrea Gasman M.D.   On: 09/07/2024 19:05    Pertinent labs & imaging results that were available during my care of the patient were reviewed by me and considered in my medical decision making (see MDM for details).  Medications Ordered in ED Medications  ipratropium-albuterol  (DUONEB) 0.5-2.5 (3) MG/3ML nebulizer solution 3 mL (3 mLs Nebulization Given 09/07/24 1823)  methylPREDNISolone  sodium succinate (SOLU-MEDROL ) 125 mg/2 mL injection 125 mg (125 mg Intravenous Given 09/07/24 1855)  furosemide  (LASIX ) injection 40 mg (40 mg Intravenous Given 09/07/24 2144)                                                                                                                                     Procedures Procedures  (including critical care time)  Medical Decision Making / ED Course    Medical Decision Making:    [name] is a 63 y.o. female with past medical history as below, significant for COPD  on 3 L nasal cannula, CHF, asthma, DM, obesity, wheelchair dependent, who presents to the ED with complaint of dyspnea. The complaint involves an extensive differential diagnosis and also carries with it a high risk of complications and morbidity.  Serious etiology was considered. Ddx includes but is not limited to: In my evaluation of this patient's dyspnea my DDx includes, but is not limited to, pneumonia, pulmonary embolism, pneumothorax, pulmonary edema, metabolic acidosis, asthma, COPD, cardiac cause, anemia, anxiety, etc.    Complete initial physical exam  performed, notably the patient was in no acute distress, hypoxic, no increased work of breathing.    Reviewed and confirmed nursing documentation for past medical history, family history, social history.  Vital signs reviewed.    Dyspnea Acute CHF> - She has trace wheezing bilateral, somewhat reduced breath sounds but she is obese.  No increased oxygen  requirement.  No change to cough, no fever, no increased leg swelling or weight gain - BNP elevated 800, troponin elevated but flat, no chest pain, EKG is nonischemic and similar to prior. She is HDS - X-ray concerning for CHF - Patient provided nebulized breathing treatments, steroids and Lasix >> reports significant improvement to symptoms.  Reports that she feels back to normal - She is breathing comfortably on her home oxygen . - Given patient's morbidities, mildly worsening kidney function and need for increased diuresis I did recommend patient for admission.  She prefers to go back to her facility and follow-up with her PCP next week.  This was discussed with patient and spouse at bedside.  Will increase her Lasix  over the next few days.  Encouraged her to see her PCP next week for repeat labs and recheck.  Advised to promptly return to the ER if her symptoms worsen or she would like further evaluation to be completed  CKD > - Cr mild worsened today, likely progression of her CKD, bicarb and K are wnl.   Clinical Course as of 09/08/24 1748  Fri Sep 07, 2024  2327 Strongly recommended pt for admission, she would prefer to go back to SNF [SG]    Clinical Course User Index [SG] Elnor Jayson LABOR, DO     Patient in no distress and overall condition is stable. Detailed discussions were had with the patient/guardian regarding current findings, and need for close f/u with PCP or on call doctor. The patient/guardian has been instructed to return immediately if the symptoms worsen in any way for re-evaluation. Patient/guardian verbalized understanding  and is in agreement with current care plan. All questions answered prior to discharge.                Additional history obtained: -Additional history obtained from family -External records from outside source obtained and reviewed including: Chart review including previous notes, labs, imaging, consultation notes including  Primary care documentation, prior labs, prior imaging   Lab Tests: -I ordered, reviewed, and interpreted labs.   The pertinent results include:   Labs Reviewed  BASIC METABOLIC PANEL WITH GFR - Abnormal; Notable for the following components:      Result Value   Glucose, Bld 383 (*)    BUN 47 (*)    Creatinine, Ser 2.23 (*)    GFR, Estimated 24 (*)    All other components within normal limits  PRO BRAIN NATRIURETIC PEPTIDE - Abnormal; Notable for the following components:   Pro Brain Natriuretic Peptide 832.0 (*)    All other components within normal limits  CBC WITH DIFFERENTIAL/PLATELET - Abnormal; Notable for  the following components:   RBC 3.76 (*)    Hemoglobin 10.5 (*)    HCT 32.9 (*)    Neutro Abs 8.2 (*)    All other components within normal limits  TROPONIN T, HIGH SENSITIVITY - Abnormal; Notable for the following components:   Troponin T High Sensitivity 82 (*)    All other components within normal limits  TROPONIN T, HIGH SENSITIVITY - Abnormal; Notable for the following components:   Troponin T High Sensitivity 79 (*)    All other components within normal limits  RESP PANEL BY RT-PCR (RSV, FLU A&B, COVID)  [id number]    Notable for as above  EKG   EKG Interpretation Date/Time:  Friday September 07 2024 18:19:03 EDT Ventricular Rate:  78 PR Interval:  137 QRS Duration:  128 QT Interval:  455 QTC Calculation: 519 R Axis:   -64  Text Interpretation: Sinus rhythm Left bundle branch block Confirmed by Elnor Savant ([id number]) on 09/08/2024 5:46:56 PM         Imaging Studies ordered: I ordered imaging studies including chest x-ray I  independently visualized the following imaging with scope of interpretation limited to determining acute life threatening conditions related to emergency care; findings noted above I agree with the radiologist interpretation If any imaging was obtained with contrast I closely monitored patient for any possible adverse reaction a/w contrast administration in the emergency department   Medicines ordered and prescription drug management: Meds ordered this encounter  Medications   ipratropium-albuterol  (DUONEB) 0.5-2.5 (3) MG/3ML nebulizer solution 3 mL   methylPREDNISolone  sodium succinate (SOLU-MEDROL ) 125 mg/2 mL injection 125 mg   furosemide  (LASIX ) injection 40 mg    -I have reviewed the patients home medicines and have made adjustments as needed   Consultations Obtained: na   Cardiac Monitoring: The patient was maintained on a cardiac monitor.  I personally viewed and interpreted the cardiac monitored which showed an underlying rhythm of: NSR Continuous pulse oximetry interpreted by myself, 98% on 2LNC.    Social Determinants of Health:  Diagnosis or treatment significantly limited by social determinants of health: obesity   Reevaluation: After the interventions noted above, I reevaluated the patient and found that they have improved  Co morbidities that complicate the patient evaluation  Past Medical History:  Diagnosis Date   Anemia    Arthritis    Asthma    Cancer of sigmoid (HCC)    COPD (chronic obstructive pulmonary disease) (HCC)    Depression    Diabetes mellitus    Diastolic dysfunction 05/15/2015   Dyspnea    Generalized weakness    Hyperlipidemia    Hypertension    Hypothyroidism    Obesity    Palpitations    Pneumonia    PONV (postoperative nausea and vomiting)    Prolonged QT interval 05/14/2015   Sleep apnea       Dispostion: Disposition decision including need for hospitalization was considered, and patient discharged from emergency  department.    Final Clinical Impression(s) / ED Diagnoses Final diagnoses:  Acute congestive heart failure, unspecified heart failure type (HCC)  AKI (acute kidney injury)        Elnor Savant LABOR, DO 09/08/24 1748

## 2024-09-07 NOTE — ED Triage Notes (Signed)
 Pt bib rcems w/c/o SOBx 1 week. Pt believes this may be tied to her anxiety, However pt has a history of CCHF, COPD, and asthma. Pt also mentioned she feels constipated.

## 2024-09-07 NOTE — Discharge Instructions (Addendum)
 It was a pleasure caring for you today in the emergency department.  I recommended you stay in the hospital, if you feel like your symptoms are worsening or not improving please return for ongoing care  Please follow up with your PCP and your heart doctor, please have your pcp re-check your labs early next week. Your kidney function seems to be slowly worsening. Please follow up with your PCP regarding ongoing care of your kidneys.  Please increase your lasix  to 40mg  in the morning and 20mg  in the evening for the next 3 days.   Please return to the emergency department for any worsening or worrisome symptoms.

## 2024-09-10 DIAGNOSIS — M6281 Muscle weakness (generalized): Secondary | ICD-10-CM | POA: Diagnosis not present

## 2024-09-10 DIAGNOSIS — E11621 Type 2 diabetes mellitus with foot ulcer: Secondary | ICD-10-CM | POA: Diagnosis not present

## 2024-09-12 DIAGNOSIS — E785 Hyperlipidemia, unspecified: Secondary | ICD-10-CM | POA: Diagnosis not present

## 2024-09-12 DIAGNOSIS — D649 Anemia, unspecified: Secondary | ICD-10-CM | POA: Diagnosis not present

## 2024-09-12 DIAGNOSIS — E039 Hypothyroidism, unspecified: Secondary | ICD-10-CM | POA: Diagnosis not present

## 2024-09-12 DIAGNOSIS — I10 Essential (primary) hypertension: Secondary | ICD-10-CM | POA: Diagnosis not present

## 2024-09-12 DIAGNOSIS — J449 Chronic obstructive pulmonary disease, unspecified: Secondary | ICD-10-CM | POA: Diagnosis not present

## 2024-09-12 DIAGNOSIS — N1832 Chronic kidney disease, stage 3b: Secondary | ICD-10-CM | POA: Diagnosis not present

## 2024-09-12 DIAGNOSIS — E1165 Type 2 diabetes mellitus with hyperglycemia: Secondary | ICD-10-CM | POA: Diagnosis not present

## 2024-09-12 DIAGNOSIS — I5032 Chronic diastolic (congestive) heart failure: Secondary | ICD-10-CM | POA: Diagnosis not present

## 2024-09-14 DIAGNOSIS — E11621 Type 2 diabetes mellitus with foot ulcer: Secondary | ICD-10-CM | POA: Diagnosis not present

## 2024-09-14 DIAGNOSIS — M6281 Muscle weakness (generalized): Secondary | ICD-10-CM | POA: Diagnosis not present

## 2024-09-18 DIAGNOSIS — E11621 Type 2 diabetes mellitus with foot ulcer: Secondary | ICD-10-CM | POA: Diagnosis not present

## 2024-09-18 DIAGNOSIS — N1832 Chronic kidney disease, stage 3b: Secondary | ICD-10-CM | POA: Diagnosis not present

## 2024-09-18 DIAGNOSIS — I10 Essential (primary) hypertension: Secondary | ICD-10-CM | POA: Diagnosis not present

## 2024-09-18 DIAGNOSIS — E1122 Type 2 diabetes mellitus with diabetic chronic kidney disease: Secondary | ICD-10-CM | POA: Diagnosis not present

## 2024-09-18 DIAGNOSIS — Z794 Long term (current) use of insulin: Secondary | ICD-10-CM | POA: Diagnosis not present

## 2024-09-18 DIAGNOSIS — D631 Anemia in chronic kidney disease: Secondary | ICD-10-CM | POA: Diagnosis not present

## 2024-09-18 DIAGNOSIS — M6281 Muscle weakness (generalized): Secondary | ICD-10-CM | POA: Diagnosis not present

## 2024-09-18 NOTE — Progress Notes (Signed)
 Follow Up Visit   Patient Name: [name], female   Patient DOB: 04/21/1961 Date of Service: 09/18/2024  Patient MRN: [id number] Provider Creating Note: Bonnell Sherry, MD  [phone] Primary Care Physician:   [phone] [address] [place] [address] Additional Physicians/ Providers:    History of Present Illness [name] is a 63 y.o. female who is following up today for diabetes mellitus type 2 with chronic kidney disease, chronic kidney disease stage IIIb, hypertension, anemia of chronic kidney disease, and secondary hyperparathyroidism.  The patient's chronic kidney disease remained stable most recent eGFR 33 with a microalbumin creatinine ratio 1078.  In regards hypertension blood pressure was a bit elevated today at 170/75.  He also has anemia of chronic kidney disease with most recent hemoglobin 12.1.  She also has lower extremity edema for which she is on furosemide  40 mg in the a.m. and 20 mg at night.   Medications   Current Outpatient Medications:  .  carboxymethylcellulose (REFRESH PLUS) 0.5 % solution, 1 drop 3 (three) times a day if needed for dry eyes, Disp: , Rfl:  .  HYDROcodone-acetaminophen  (NORCO) 5-325 MG per tablet, Take 1 tablet by mouth every 6 (six) hours if needed for moderate pain, Disp: , Rfl:  .  albuterol  HFA (PROVENTIL  HFA;VENTOLIN  HFA) 108 (90 Base) MCG/ACT inhaler, Inhale, Disp: , Rfl:  .  amLODIPine  (NORVASC ) 5 MG tablet, Take 10 mg by mouth 1 (one) time each day, Disp: , Rfl:  .  Ascorbic Acid  (vitamin C ) 500 MG tablet, Take 500 mg by mouth 1 (one) time each day, Disp: , Rfl:  .  aspirin  (ST JOSEPH) 81 MG EC tablet, Take 81 mg by mouth, Disp: , Rfl:  .  atorvastatin  (Lipitor) 10 MG tablet, Take 10 mg by mouth every night, Disp: , Rfl:  .  Biotin 10 MG tablet, Take 1 tablet by mouth 1 (one) time each day, Disp: , Rfl:  .  cetirizine (ZyrTEC) 10 MG tablet, Take 10 mg by mouth 1 (one) time each day, Disp: , Rfl:  .  Cholecalciferol (Vitamin D) 50 MCG  (2000 UT) capsule, Take 2,000 Units by mouth 1 (one) time each day, Disp: , Rfl:  .  CRANBERRY PO, Take 300 mg by mouth, Disp: , Rfl:  .  docusate sodium  (COLACE) 100 MG capsule, Take 100 mg by mouth in the morning and 100 mg in the evening., Disp: , Rfl:  .  Emollient (eucerin) lotion, Apply 1 Application topically, Disp: , Rfl:  .  furosemide  (LASIX ) 20 MG tablet, 40 mg in the morning and 20 mg at night, Disp: , Rfl:  .  gabapentin  (NEURONTIN ) 300 MG capsule, Take 1 capsule in AM, 2 capsules in PM, Disp: , Rfl:  .  hydrALAZINE  25 MG tablet, Take 25 mg by mouth, Disp: , Rfl:  .  insulin  glargine (Lantus ) 100 UNIT/ML injection, Inject 70 Units under the skin every night, Disp: , Rfl:  .  levothyroxine  (SYNTHROID , LEVOTHROID) 88 MCG tablet, Take 88 mcg by mouth, Disp: , Rfl:  .  Menthol-Zinc  Oxide (Calmoseptine) 0.44-20.6 % ointment, Apply 1 Application topically, Disp: , Rfl:  .  Mirabegron  ER (Myrbetriq ) 25 MG tablet sustained-release 24 hour, Take 25 mg by mouth in the morning., Disp: , Rfl:  .  Multiple Vitamins-Minerals (multivitamin with minerals) tablet, Take 1 tablet by mouth 1 (one) time each day, Disp: , Rfl:  .  NovoLOG  100 UNIT/ML solution, Inject 14 Units under the skin Sliding scale, Disp: ,  Rfl:  .  omeprazole (PriLOSEC) 20 MG DR capsule, Take 20 mg by mouth 1 (one) time each day, Disp: , Rfl:  .  ondansetron  (ZOFRAN ) 8 MG tablet, , Disp: , Rfl:  .  potassium chloride  (KLOR-CON  M20) 20 MEQ CR tablet, Take 20 mEq by mouth 1 (one) time each day, Disp: , Rfl:  .  propranolol  (INDERAL ) 80 MG tablet, Take 80 mg by mouth 1 (one) time each day, Disp: , Rfl:  .  Pseudoephedrine-DM-GG (ROBITUSSIN CF PO), Take by mouth, Disp: , Rfl:  .  saccharomyces boulardii (FLORASTOR) 250 MG capsule, Take 250 mg by mouth, Disp: , Rfl:  .  sertraline  (ZOLOFT ) 100 MG tablet, , Disp: , Rfl:  .  UNABLE TO FIND, Med Name: maalox plus suspension 225-200-25 mg/5 mL every 2 hours as needed, Disp: , Rfl:     Allergies Carvedilol , Citalopram, Dulaglutide, Olmesartan, Sulfa antibiotics, and Codeine  Problem List There is no problem list on file for this patient.    Review of Systems  Constitutional:  Positive for malaise/fatigue. Negative for chills and fever.  Respiratory:  Negative for cough and shortness of breath.   Cardiovascular:  Positive for leg swelling. Negative for chest pain and palpitations.  Gastrointestinal:  Negative for nausea and vomiting.  Genitourinary:  Negative for dysuria, hematuria and urgency.     History Past Medical History:  Diagnosis Date  . Anemia   . Chronic airway obstruction, not elsewhere classified (HCC)   . Congestive heart failure (HCC)   . Diabetes mellitus without mention of complication, type II or unspecified type, not stated as uncontrolled (HCC)   . Esophageal reflux   . Essential hypertension   . Sleep apnea     History reviewed. No pertinent surgical history. Family History  Problem Relation Age of Onset  . Hypertension Mother   . Diabetes Mother   . Heart disease Father   . Hypertension Father   . Diabetes Father   . Stroke Sister   . Hypertension Sister   . Diabetes Sister    Social History   Tobacco Use  . Smoking status: Never  . Smokeless tobacco: Never  Substance Use Topics  . Alcohol use: Not Currently        Physical Exam  Vitals BP 170/75 (BP Location: Left upper arm, Patient Position: Sitting)   Pulse 75   Temp 97.5 F   SpO2 95%   PHYSICAL EXAM: General appearance: well developed, well nourished, NAD Eyes: anicteric sclerae, moist conjunctivae; no lid-lag  HENT: Atraumatic; hearing intact Neck: Trachea midline; supple Lungs: Scattered wheezes, normal effort CV: S1S2, no murmurs or rubs. Abdomen: Soft, non-tender; bowel sounds present Extremities: 1+ bilateral lower extremity edema Skin: Warm and dry, normal skin turgor, no rashes noted. Psych: Appropriate affect, alert and oriented to person,  place and time    Laboratory Studies      No lab exists for component: GFR      No lab exists for component: IRON SATURATION, TRANSSATPER          No lab exists for component: GLUCOSEUR, BILIRUBINUR, SPECGRAV, RBCUR, LEUKOCYTESUR, NITRITE  Lab Results  Component Value Date   CALCIUM  9.4 03/06/2019     Imaging and Other Studies   05/11/23: Negative SPEP/UPEP/ANA. 06/07/2023: Renal ultrasound right kidney 13 cm, left kidney 11.8 cm, no hydronephrosis.  Orders Placed This Encounter  . CBC and Differential  . Renal Function Panel  . PTH, Intact  . Urine Albumin / Creatinine Ratio  Impression/Recommendations  Teren Franckowiak is a 63 y.o. female with past medical history of COPD, diabetes mellitus type 2, GERD, hypertension, obstructive sleep apnea, hyperlipidemia, congestive heart failure with diastolic dysfunction, depression, history of rectosigmoid colon cancer who was referred for evaluation of chronic kidney disease stage IIIb.  1.  Diabetes mellitus type 2 with chronic kidney disease/chronic kidney disease stage IIIb.  Based upon most recent labs we have available most recent eGFR 33 with a albumin to creatinine ratio of 1078.  Patient has documented allergy to olmesartan therefore we are avoiding ARB's.  Follow-up renal parameters today.  2.  Hypertension.  Blood pressure bit higher than before at 170/75.  Maintain the patient on amlodipine , hydralazine , and Lasix .  3.  Anemia of chronic disease.  Most recent hemoglobin was 12.1.  No immediate need for Epogen.  Repeat CBC today.  4.  Lower extremity edema.  Patient remains on Lasix  40 mg in the a.m. and 20 mg in the p.m.  We also recommended that she continue to keep her legs elevated.   Return in about 4 months (around 01/19/2025).   Munsoor Lateef, MD

## 2024-09-19 DIAGNOSIS — G5601 Carpal tunnel syndrome, right upper limb: Secondary | ICD-10-CM | POA: Diagnosis not present

## 2024-09-19 DIAGNOSIS — T8131XA Disruption of external operation (surgical) wound, not elsewhere classified, initial encounter: Secondary | ICD-10-CM | POA: Diagnosis not present

## 2024-09-20 DIAGNOSIS — E11621 Type 2 diabetes mellitus with foot ulcer: Secondary | ICD-10-CM | POA: Diagnosis not present

## 2024-09-20 DIAGNOSIS — M6281 Muscle weakness (generalized): Secondary | ICD-10-CM | POA: Diagnosis not present

## 2024-09-24 DIAGNOSIS — I5032 Chronic diastolic (congestive) heart failure: Secondary | ICD-10-CM | POA: Diagnosis not present

## 2024-09-24 DIAGNOSIS — Z79899 Other long term (current) drug therapy: Secondary | ICD-10-CM | POA: Diagnosis not present

## 2024-09-24 DIAGNOSIS — E11621 Type 2 diabetes mellitus with foot ulcer: Secondary | ICD-10-CM | POA: Diagnosis not present

## 2024-09-24 DIAGNOSIS — M6281 Muscle weakness (generalized): Secondary | ICD-10-CM | POA: Diagnosis not present

## 2024-09-24 DIAGNOSIS — R600 Localized edema: Secondary | ICD-10-CM | POA: Diagnosis not present

## 2024-09-25 DIAGNOSIS — I5032 Chronic diastolic (congestive) heart failure: Secondary | ICD-10-CM | POA: Diagnosis not present

## 2024-09-25 DIAGNOSIS — R0902 Hypoxemia: Secondary | ICD-10-CM | POA: Diagnosis not present

## 2024-09-25 DIAGNOSIS — Z79899 Other long term (current) drug therapy: Secondary | ICD-10-CM | POA: Diagnosis not present

## 2024-09-25 DIAGNOSIS — I5089 Other heart failure: Secondary | ICD-10-CM | POA: Diagnosis not present

## 2024-09-25 DIAGNOSIS — R0602 Shortness of breath: Secondary | ICD-10-CM | POA: Diagnosis not present

## 2024-09-27 DIAGNOSIS — M6281 Muscle weakness (generalized): Secondary | ICD-10-CM | POA: Diagnosis not present

## 2024-09-27 DIAGNOSIS — E11621 Type 2 diabetes mellitus with foot ulcer: Secondary | ICD-10-CM | POA: Diagnosis not present

## 2024-09-28 DIAGNOSIS — E11621 Type 2 diabetes mellitus with foot ulcer: Secondary | ICD-10-CM | POA: Diagnosis not present

## 2024-09-28 DIAGNOSIS — M6281 Muscle weakness (generalized): Secondary | ICD-10-CM | POA: Diagnosis not present

## 2024-10-01 DIAGNOSIS — E11621 Type 2 diabetes mellitus with foot ulcer: Secondary | ICD-10-CM | POA: Diagnosis not present

## 2024-10-01 DIAGNOSIS — M6281 Muscle weakness (generalized): Secondary | ICD-10-CM | POA: Diagnosis not present

## 2024-10-02 DIAGNOSIS — E103513 Type 1 diabetes mellitus with proliferative diabetic retinopathy with macular edema, bilateral: Secondary | ICD-10-CM | POA: Diagnosis not present

## 2024-10-02 DIAGNOSIS — Z961 Presence of intraocular lens: Secondary | ICD-10-CM | POA: Diagnosis not present

## 2024-10-02 DIAGNOSIS — H35033 Hypertensive retinopathy, bilateral: Secondary | ICD-10-CM | POA: Diagnosis not present

## 2024-10-02 DIAGNOSIS — H43823 Vitreomacular adhesion, bilateral: Secondary | ICD-10-CM | POA: Diagnosis not present

## 2024-10-04 DIAGNOSIS — G5601 Carpal tunnel syndrome, right upper limb: Secondary | ICD-10-CM | POA: Diagnosis not present

## 2024-10-05 DIAGNOSIS — E11621 Type 2 diabetes mellitus with foot ulcer: Secondary | ICD-10-CM | POA: Diagnosis not present

## 2024-10-05 DIAGNOSIS — M6281 Muscle weakness (generalized): Secondary | ICD-10-CM | POA: Diagnosis not present

## 2024-10-06 DIAGNOSIS — I517 Cardiomegaly: Secondary | ICD-10-CM | POA: Diagnosis not present

## 2024-10-06 DIAGNOSIS — M6281 Muscle weakness (generalized): Secondary | ICD-10-CM | POA: Diagnosis not present

## 2024-10-08 DIAGNOSIS — Z79899 Other long term (current) drug therapy: Secondary | ICD-10-CM | POA: Diagnosis not present

## 2024-10-08 DIAGNOSIS — I5032 Chronic diastolic (congestive) heart failure: Secondary | ICD-10-CM | POA: Diagnosis not present

## 2024-10-08 DIAGNOSIS — E11621 Type 2 diabetes mellitus with foot ulcer: Secondary | ICD-10-CM | POA: Diagnosis not present

## 2024-10-08 DIAGNOSIS — M6281 Muscle weakness (generalized): Secondary | ICD-10-CM | POA: Diagnosis not present

## 2024-10-08 DIAGNOSIS — R0602 Shortness of breath: Secondary | ICD-10-CM | POA: Diagnosis not present

## 2024-10-09 DIAGNOSIS — I5032 Chronic diastolic (congestive) heart failure: Secondary | ICD-10-CM | POA: Diagnosis not present

## 2024-10-09 DIAGNOSIS — R0602 Shortness of breath: Secondary | ICD-10-CM | POA: Diagnosis not present

## 2024-10-09 DIAGNOSIS — Z79899 Other long term (current) drug therapy: Secondary | ICD-10-CM | POA: Diagnosis not present

## 2024-10-09 DIAGNOSIS — R140 Abdominal distension (gaseous): Secondary | ICD-10-CM | POA: Diagnosis not present

## 2024-10-11 DIAGNOSIS — E11621 Type 2 diabetes mellitus with foot ulcer: Secondary | ICD-10-CM | POA: Diagnosis not present

## 2024-10-11 DIAGNOSIS — M6281 Muscle weakness (generalized): Secondary | ICD-10-CM | POA: Diagnosis not present

## 2024-10-14 ENCOUNTER — Encounter (HOSPITAL_COMMUNITY): Payer: Self-pay | Admitting: Internal Medicine

## 2024-10-14 ENCOUNTER — Other Ambulatory Visit: Payer: Self-pay

## 2024-10-14 ENCOUNTER — Inpatient Hospital Stay (HOSPITAL_COMMUNITY)
Admission: EM | Admit: 2024-10-14 | Discharge: 2024-10-17 | DRG: 291 | Disposition: A | Discharge status: Skilled Nursing Facility | Source: Skilled Nursing Facility | Attending: Internal Medicine | Admitting: Internal Medicine

## 2024-10-14 ENCOUNTER — Observation Stay (HOSPITAL_COMMUNITY)

## 2024-10-14 ENCOUNTER — Emergency Department (HOSPITAL_COMMUNITY)

## 2024-10-14 DIAGNOSIS — C19 Malignant neoplasm of rectosigmoid junction: Secondary | ICD-10-CM | POA: Diagnosis present

## 2024-10-14 DIAGNOSIS — Z888 Allergy status to other drugs, medicaments and biological substances status: Secondary | ICD-10-CM

## 2024-10-14 DIAGNOSIS — Z59868 Other specified financial insecurity: Secondary | ICD-10-CM

## 2024-10-14 DIAGNOSIS — I5031 Acute diastolic (congestive) heart failure: Secondary | ICD-10-CM | POA: Diagnosis not present

## 2024-10-14 DIAGNOSIS — Z79890 Hormone replacement therapy (postmenopausal): Secondary | ICD-10-CM

## 2024-10-14 DIAGNOSIS — E1122 Type 2 diabetes mellitus with diabetic chronic kidney disease: Secondary | ICD-10-CM | POA: Diagnosis present

## 2024-10-14 DIAGNOSIS — I509 Heart failure, unspecified: Secondary | ICD-10-CM | POA: Diagnosis not present

## 2024-10-14 DIAGNOSIS — Z882 Allergy status to sulfonamides status: Secondary | ICD-10-CM

## 2024-10-14 DIAGNOSIS — G63 Polyneuropathy in diseases classified elsewhere: Secondary | ICD-10-CM | POA: Diagnosis present

## 2024-10-14 DIAGNOSIS — N179 Acute kidney failure, unspecified: Secondary | ICD-10-CM | POA: Diagnosis present

## 2024-10-14 DIAGNOSIS — G4733 Obstructive sleep apnea (adult) (pediatric): Secondary | ICD-10-CM | POA: Diagnosis present

## 2024-10-14 DIAGNOSIS — Z8249 Family history of ischemic heart disease and other diseases of the circulatory system: Secondary | ICD-10-CM

## 2024-10-14 DIAGNOSIS — N1832 Chronic kidney disease, stage 3b: Secondary | ICD-10-CM | POA: Diagnosis present

## 2024-10-14 DIAGNOSIS — I130 Hypertensive heart and chronic kidney disease with heart failure and stage 1 through stage 4 chronic kidney disease, or unspecified chronic kidney disease: Secondary | ICD-10-CM | POA: Diagnosis not present

## 2024-10-14 DIAGNOSIS — Z833 Family history of diabetes mellitus: Secondary | ICD-10-CM

## 2024-10-14 DIAGNOSIS — J9612 Chronic respiratory failure with hypercapnia: Secondary | ICD-10-CM | POA: Diagnosis present

## 2024-10-14 DIAGNOSIS — I5033 Acute on chronic diastolic (congestive) heart failure: Secondary | ICD-10-CM | POA: Diagnosis present

## 2024-10-14 DIAGNOSIS — R918 Other nonspecific abnormal finding of lung field: Secondary | ICD-10-CM | POA: Diagnosis not present

## 2024-10-14 DIAGNOSIS — J441 Chronic obstructive pulmonary disease with (acute) exacerbation: Principal | ICD-10-CM | POA: Diagnosis present

## 2024-10-14 DIAGNOSIS — Z6841 Body Mass Index (BMI) 40.0 and over, adult: Secondary | ICD-10-CM

## 2024-10-14 DIAGNOSIS — I10 Essential (primary) hypertension: Secondary | ICD-10-CM | POA: Diagnosis not present

## 2024-10-14 DIAGNOSIS — E782 Mixed hyperlipidemia: Secondary | ICD-10-CM | POA: Diagnosis present

## 2024-10-14 DIAGNOSIS — J449 Chronic obstructive pulmonary disease, unspecified: Secondary | ICD-10-CM | POA: Diagnosis present

## 2024-10-14 DIAGNOSIS — Z823 Family history of stroke: Secondary | ICD-10-CM

## 2024-10-14 DIAGNOSIS — R0602 Shortness of breath: Secondary | ICD-10-CM | POA: Diagnosis present

## 2024-10-14 DIAGNOSIS — E1165 Type 2 diabetes mellitus with hyperglycemia: Secondary | ICD-10-CM | POA: Diagnosis present

## 2024-10-14 DIAGNOSIS — I517 Cardiomegaly: Secondary | ICD-10-CM | POA: Diagnosis not present

## 2024-10-14 DIAGNOSIS — Z794 Long term (current) use of insulin: Secondary | ICD-10-CM

## 2024-10-14 DIAGNOSIS — J9611 Chronic respiratory failure with hypoxia: Secondary | ICD-10-CM | POA: Diagnosis present

## 2024-10-14 DIAGNOSIS — E039 Hypothyroidism, unspecified: Secondary | ICD-10-CM | POA: Diagnosis present

## 2024-10-14 DIAGNOSIS — I110 Hypertensive heart disease with heart failure: Secondary | ICD-10-CM | POA: Diagnosis not present

## 2024-10-14 DIAGNOSIS — E1142 Type 2 diabetes mellitus with diabetic polyneuropathy: Secondary | ICD-10-CM | POA: Diagnosis present

## 2024-10-14 DIAGNOSIS — E662 Morbid (severe) obesity with alveolar hypoventilation: Secondary | ICD-10-CM | POA: Diagnosis present

## 2024-10-14 DIAGNOSIS — I493 Ventricular premature depolarization: Secondary | ICD-10-CM | POA: Diagnosis present

## 2024-10-14 DIAGNOSIS — F32A Depression, unspecified: Secondary | ICD-10-CM | POA: Diagnosis present

## 2024-10-14 DIAGNOSIS — I152 Hypertension secondary to endocrine disorders: Secondary | ICD-10-CM | POA: Diagnosis present

## 2024-10-14 DIAGNOSIS — Z885 Allergy status to narcotic agent status: Secondary | ICD-10-CM

## 2024-10-14 DIAGNOSIS — I878 Other specified disorders of veins: Secondary | ICD-10-CM | POA: Diagnosis present

## 2024-10-14 DIAGNOSIS — R062 Wheezing: Secondary | ICD-10-CM | POA: Diagnosis not present

## 2024-10-14 DIAGNOSIS — D631 Anemia in chronic kidney disease: Secondary | ICD-10-CM | POA: Diagnosis present

## 2024-10-14 DIAGNOSIS — Z7401 Bed confinement status: Secondary | ICD-10-CM

## 2024-10-14 DIAGNOSIS — Z7951 Long term (current) use of inhaled steroids: Secondary | ICD-10-CM

## 2024-10-14 DIAGNOSIS — Z79899 Other long term (current) drug therapy: Secondary | ICD-10-CM

## 2024-10-14 DIAGNOSIS — Z7982 Long term (current) use of aspirin: Secondary | ICD-10-CM

## 2024-10-14 DIAGNOSIS — Z66 Do not resuscitate: Secondary | ICD-10-CM | POA: Diagnosis present

## 2024-10-14 DIAGNOSIS — E1159 Type 2 diabetes mellitus with other circulatory complications: Secondary | ICD-10-CM | POA: Diagnosis present

## 2024-10-14 DIAGNOSIS — Z993 Dependence on wheelchair: Secondary | ICD-10-CM

## 2024-10-14 DIAGNOSIS — Z9981 Dependence on supplemental oxygen: Secondary | ICD-10-CM

## 2024-10-14 LAB — CBC WITH DIFFERENTIAL/PLATELET
Abs Immature Granulocytes: 0.04 K/uL (ref 0.00–0.07)
Basophils Absolute: 0 K/uL (ref 0.0–0.1)
Basophils Relative: 0 %
Eosinophils Absolute: 0.3 K/uL (ref 0.0–0.5)
Eosinophils Relative: 3 %
HCT: 32.3 % — ABNORMAL LOW (ref 36.0–46.0)
Hemoglobin: 10 g/dL — ABNORMAL LOW (ref 12.0–15.0)
Immature Granulocytes: 0 %
Lymphocytes Relative: 8 %
Lymphs Abs: 0.8 K/uL (ref 0.7–4.0)
MCH: 27.2 pg (ref 26.0–34.0)
MCHC: 31 g/dL (ref 30.0–36.0)
MCV: 88 fL (ref 80.0–100.0)
Monocytes Absolute: 0.4 K/uL (ref 0.1–1.0)
Monocytes Relative: 4 %
Neutro Abs: 7.8 K/uL — ABNORMAL HIGH (ref 1.7–7.7)
Neutrophils Relative %: 85 %
Platelets: 301 K/uL (ref 150–400)
RBC: 3.67 MIL/uL — ABNORMAL LOW (ref 3.87–5.11)
RDW: 14.8 % (ref 11.5–15.5)
WBC: 9.3 K/uL (ref 4.0–10.5)
nRBC: 0 % (ref 0.0–0.2)

## 2024-10-14 LAB — BLOOD GAS, VENOUS
Acid-Base Excess: 12.4 mmol/L — ABNORMAL HIGH (ref 0.0–2.0)
Bicarbonate: 41.2 mmol/L — ABNORMAL HIGH (ref 20.0–28.0)
Drawn by: 71744
O2 Saturation: 71.3 %
Patient temperature: 36.8
pCO2, Ven: 72 mmHg (ref 44–60)
pH, Ven: 7.36 (ref 7.25–7.43)
pO2, Ven: 42 mmHg (ref 32–45)

## 2024-10-14 LAB — COMPREHENSIVE METABOLIC PANEL WITH GFR
ALT: 11 U/L (ref 0–44)
AST: 15 U/L (ref 15–41)
Albumin: 3.7 g/dL (ref 3.5–5.0)
Alkaline Phosphatase: 101 U/L (ref 38–126)
Anion gap: 6 (ref 5–15)
BUN: 36 mg/dL — ABNORMAL HIGH (ref 8–23)
CO2: 37 mmol/L — ABNORMAL HIGH (ref 22–32)
Calcium: 9.6 mg/dL (ref 8.9–10.3)
Chloride: 100 mmol/L (ref 98–111)
Creatinine, Ser: 2.43 mg/dL — ABNORMAL HIGH (ref 0.44–1.00)
GFR, Estimated: 22 mL/min — ABNORMAL LOW (ref >60)
Glucose, Bld: 107 mg/dL — ABNORMAL HIGH (ref 70–99)
Potassium: 4.1 mmol/L (ref 3.5–5.1)
Sodium: 143 mmol/L (ref 135–145)
Total Bilirubin: 0.4 mg/dL (ref 0.0–1.2)
Total Protein: 6.9 g/dL (ref 6.5–8.1)

## 2024-10-14 LAB — ECHOCARDIOGRAM COMPLETE
AR max vel: 2.36 cm2
AV Area VTI: 2.46 cm2
AV Area mean vel: 2.58 cm2
AV Mean grad: 4 mmHg
AV Peak grad: 8 mmHg
Ao pk vel: 1.41 m/s
Area-P 1/2: 3.91 cm2
S' Lateral: 3.1 cm

## 2024-10-14 LAB — GLUCOSE, CAPILLARY
Glucose-Capillary: 314 mg/dL — ABNORMAL HIGH (ref 70–99)
Glucose-Capillary: 301 mg/dL — ABNORMAL HIGH (ref 70–99)

## 2024-10-14 LAB — RESP PANEL BY RT-PCR (RSV, FLU A&B, COVID)  RVPGX2
Influenza A by PCR: NEGATIVE
Influenza B by PCR: NEGATIVE
Resp Syncytial Virus by PCR: NEGATIVE
SARS Coronavirus 2 by RT PCR: NEGATIVE

## 2024-10-14 LAB — HEMOGLOBIN A1C
Hgb A1c MFr Bld: 7.2 % — ABNORMAL HIGH (ref 4.8–5.6)
Mean Plasma Glucose: 159.94 mg/dL

## 2024-10-14 LAB — TSH: TSH: 3.42 u[IU]/mL (ref 0.350–4.500)

## 2024-10-14 LAB — HIV ANTIBODY (ROUTINE TESTING W REFLEX): HIV Screen 4th Generation wRfx: NONREACTIVE

## 2024-10-14 LAB — TROPONIN T, HIGH SENSITIVITY
Troponin T High Sensitivity: 78 ng/L — ABNORMAL HIGH (ref 0–19)
Troponin T High Sensitivity: 79 ng/L — ABNORMAL HIGH (ref 0–19)

## 2024-10-14 LAB — PRO BRAIN NATRIURETIC PEPTIDE: Pro Brain Natriuretic Peptide: 853 pg/mL — ABNORMAL HIGH (ref <300.0)

## 2024-10-14 LAB — CBG MONITORING, ED: Glucose-Capillary: 143 mg/dL — ABNORMAL HIGH (ref 70–99)

## 2024-10-14 LAB — MAGNESIUM: Magnesium: 2.5 mg/dL — ABNORMAL HIGH (ref 1.7–2.4)

## 2024-10-14 MED ORDER — ACETAMINOPHEN 650 MG RE SUPP: 650.0000 mg | Freq: Four times a day (QID) | RECTAL | Status: DC | PRN

## 2024-10-14 MED ORDER — IPRATROPIUM-ALBUTEROL 0.5-2.5 (3) MG/3ML IN SOLN
3.0000 mL | Freq: Once | RESPIRATORY_TRACT | Status: AC
  Administered 2024-10-14: 3 mL via RESPIRATORY_TRACT
  Filled 2024-10-14: qty 3

## 2024-10-14 MED ORDER — AMLODIPINE BESYLATE 5 MG PO TABS
10.0000 mg | ORAL_TABLET | Freq: Every day | ORAL | Status: DC
  Administered 2024-10-14 – 2024-10-17 (×4): 10 mg via ORAL
  Filled 2024-10-14 (×4): qty 2

## 2024-10-14 MED ORDER — SACCHAROMYCES BOULARDII 250 MG PO CAPS
250.0000 mg | ORAL_CAPSULE | Freq: Two times a day (BID) | ORAL | Status: DC
  Administered 2024-10-14 – 2024-10-17 (×6): 250 mg via ORAL
  Filled 2024-10-14 (×8): qty 1

## 2024-10-14 MED ORDER — INSULIN ASPART 100 UNIT/ML IJ SOLN
0.0000 [IU] | Freq: Three times a day (TID) | INTRAMUSCULAR | Status: DC
  Administered 2024-10-14: 2 [IU] via SUBCUTANEOUS
  Administered 2024-10-14: 11 [IU] via SUBCUTANEOUS
  Administered 2024-10-15: 8 [IU] via SUBCUTANEOUS
  Administered 2024-10-15: 3 [IU] via SUBCUTANEOUS
  Administered 2024-10-15: 11 [IU] via SUBCUTANEOUS
  Administered 2024-10-16: 5 [IU] via SUBCUTANEOUS
  Administered 2024-10-16: 8 [IU] via SUBCUTANEOUS
  Administered 2024-10-16: 3 [IU] via SUBCUTANEOUS
  Administered 2024-10-17: 8 [IU] via SUBCUTANEOUS
  Administered 2024-10-17: 3 [IU] via SUBCUTANEOUS
  Filled 2024-10-14 (×9): qty 1

## 2024-10-14 MED ORDER — FUROSEMIDE 10 MG/ML IJ SOLN
40.0000 mg | Freq: Once | INTRAMUSCULAR | Status: AC
  Administered 2024-10-14: 40 mg via INTRAVENOUS
  Filled 2024-10-14: qty 4

## 2024-10-14 MED ORDER — PERFLUTREN LIPID MICROSPHERE
1.0000 mL | INTRAVENOUS | Status: AC | PRN
  Administered 2024-10-14: 2 mL via INTRAVENOUS

## 2024-10-14 MED ORDER — GABAPENTIN 300 MG PO CAPS
300.0000 mg | ORAL_CAPSULE | Freq: Two times a day (BID) | ORAL | Status: DC
  Administered 2024-10-14 – 2024-10-17 (×7): 300 mg via ORAL
  Filled 2024-10-14 (×7): qty 1

## 2024-10-14 MED ORDER — ONDANSETRON HCL 4 MG/2ML IJ SOLN
4.0000 mg | Freq: Four times a day (QID) | INTRAMUSCULAR | Status: DC | PRN
Start: 2024-10-14 — End: 2024-10-17

## 2024-10-14 MED ORDER — ALBUTEROL SULFATE (2.5 MG/3ML) 0.083% IN NEBU
2.5000 mg | INHALATION_SOLUTION | RESPIRATORY_TRACT | Status: DC | PRN
  Administered 2024-10-14: 2.5 mg via RESPIRATORY_TRACT
  Filled 2024-10-14: qty 3

## 2024-10-14 MED ORDER — ACETAMINOPHEN 325 MG PO TABS: 650.0000 mg | ORAL_TABLET | Freq: Four times a day (QID) | ORAL | Status: DC | PRN

## 2024-10-14 MED ORDER — VITAMIN D 25 MCG (1000 UNIT) PO TABS
2000.0000 [IU] | ORAL_TABLET | Freq: Every day | ORAL | Status: DC
  Administered 2024-10-14 – 2024-10-17 (×4): 2000 [IU] via ORAL
  Filled 2024-10-14 (×4): qty 2

## 2024-10-14 MED ORDER — VITAMIN C 500 MG PO TABS
500.0000 mg | ORAL_TABLET | Freq: Two times a day (BID) | ORAL | Status: DC
  Administered 2024-10-14 – 2024-10-17 (×7): 500 mg via ORAL
  Filled 2024-10-14 (×7): qty 1

## 2024-10-14 MED ORDER — PANTOPRAZOLE SODIUM 40 MG PO TBEC
40.0000 mg | DELAYED_RELEASE_TABLET | Freq: Every day | ORAL | Status: DC
  Administered 2024-10-14 – 2024-10-17 (×4): 40 mg via ORAL
  Filled 2024-10-14 (×4): qty 1

## 2024-10-14 MED ORDER — CRANBERRY 300 MG PO TABS: 300.0000 mg | ORAL_TABLET | Freq: Two times a day (BID) | ORAL | Status: DC

## 2024-10-14 MED ORDER — ESTRADIOL 0.1 MG/GM VA CREA
1.0000 | TOPICAL_CREAM | Freq: Every day | VAGINAL | Status: DC
  Administered 2024-10-14 – 2024-10-16 (×4): 1 via VAGINAL
  Filled 2024-10-14: qty 42.5

## 2024-10-14 MED ORDER — POTASSIUM CHLORIDE CRYS ER 20 MEQ PO TBCR
40.0000 meq | EXTENDED_RELEASE_TABLET | Freq: Every day | ORAL | Status: DC
  Administered 2024-10-14 – 2024-10-17 (×4): 40 meq via ORAL
  Filled 2024-10-14: qty 2
  Filled 2024-10-14: qty 4
  Filled 2024-10-14 (×2): qty 2

## 2024-10-14 MED ORDER — LEVOTHYROXINE SODIUM 88 MCG PO TABS
88.0000 ug | ORAL_TABLET | Freq: Every day | ORAL | Status: DC
  Administered 2024-10-14 – 2024-10-17 (×4): 88 ug via ORAL
  Filled 2024-10-14 (×4): qty 1

## 2024-10-14 MED ORDER — SERTRALINE HCL 50 MG PO TABS
100.0000 mg | ORAL_TABLET | Freq: Every day | ORAL | Status: DC
  Administered 2024-10-14 – 2024-10-17 (×4): 100 mg via ORAL
  Filled 2024-10-14 (×4): qty 2

## 2024-10-14 MED ORDER — ALUM & MAG HYDROXIDE-SIMETH 200-200-20 MG/5ML PO SUSP: 30.0000 mL | ORAL | Status: DC | PRN

## 2024-10-14 MED ORDER — PROSOURCE PLUS PO LIQD
30.0000 mL | Freq: Every day | ORAL | Status: DC
  Administered 2024-10-15 – 2024-10-17 (×3): 30 mL via ORAL
  Filled 2024-10-14 (×6): qty 30

## 2024-10-14 MED ORDER — ONDANSETRON HCL 4 MG PO TABS: 4.0000 mg | ORAL_TABLET | Freq: Four times a day (QID) | ORAL | Status: DC | PRN

## 2024-10-14 MED ORDER — ASPIRIN 81 MG PO TBEC
81.0000 mg | DELAYED_RELEASE_TABLET | Freq: Every day | ORAL | Status: DC
  Administered 2024-10-14 – 2024-10-17 (×4): 81 mg via ORAL
  Filled 2024-10-14 (×4): qty 1

## 2024-10-14 MED ORDER — INSULIN ASPART 100 UNIT/ML IJ SOLN
0.0000 [IU] | Freq: Every day | INTRAMUSCULAR | Status: DC
  Administered 2024-10-14 – 2024-10-15 (×2): 4 [IU] via SUBCUTANEOUS
  Administered 2024-10-16: 2 [IU] via SUBCUTANEOUS
  Filled 2024-10-14 (×3): qty 1

## 2024-10-14 MED ORDER — INSULIN GLARGINE-YFGN 100 UNIT/ML ~~LOC~~ SOLN
50.0000 [IU] | Freq: Every day | SUBCUTANEOUS | Status: DC
  Administered 2024-10-14 – 2024-10-17 (×4): 50 [IU] via SUBCUTANEOUS
  Filled 2024-10-14 (×5): qty 0.5

## 2024-10-14 MED ORDER — FUROSEMIDE 10 MG/ML IJ SOLN
80.0000 mg | Freq: Two times a day (BID) | INTRAMUSCULAR | Status: DC
  Administered 2024-10-14 – 2024-10-17 (×6): 80 mg via INTRAVENOUS
  Filled 2024-10-14 (×6): qty 8

## 2024-10-14 MED ORDER — DOCUSATE SODIUM 100 MG PO CAPS
100.0000 mg | ORAL_CAPSULE | Freq: Every day | ORAL | Status: DC
  Administered 2024-10-14 – 2024-10-17 (×4): 100 mg via ORAL
  Filled 2024-10-14 (×4): qty 1

## 2024-10-14 MED ORDER — ATORVASTATIN CALCIUM 10 MG PO TABS
10.0000 mg | ORAL_TABLET | Freq: Every day | ORAL | Status: DC
  Administered 2024-10-14 – 2024-10-16 (×3): 10 mg via ORAL
  Filled 2024-10-14 (×3): qty 1

## 2024-10-14 MED ORDER — HEPARIN SODIUM (PORCINE) 5000 UNIT/ML IJ SOLN
5000.0000 [IU] | Freq: Three times a day (TID) | INTRAMUSCULAR | Status: DC
  Administered 2024-10-14 – 2024-10-17 (×8): 5000 [IU] via SUBCUTANEOUS
  Filled 2024-10-14 (×8): qty 1

## 2024-10-14 MED ORDER — ADULT MULTIVITAMIN W/MINERALS CH
1.0000 | ORAL_TABLET | Freq: Every day | ORAL | Status: DC
  Administered 2024-10-14 – 2024-10-17 (×4): 1 via ORAL
  Filled 2024-10-14 (×4): qty 1

## 2024-10-14 MED ORDER — FLUTICASONE FUROATE-VILANTEROL 100-25 MCG/ACT IN AEPB
1.0000 | INHALATION_SPRAY | Freq: Every day | RESPIRATORY_TRACT | Status: DC
  Administered 2024-10-14 – 2024-10-17 (×4): 1 via RESPIRATORY_TRACT
  Filled 2024-10-14: qty 28

## 2024-10-14 NOTE — ED Triage Notes (Signed)
 Pt arrived via [facility] from [facility] nursing home cc of SOB. EMS endorses that the pt is normally on 3L [place]. Upon arrival EMS noted that the pt was on 3L of O2 and SpO2 was 89%. EMS endorses placing the pt on  NRB 15L for increase of SpO2 to above 94%. EMS endorses that they gave the pt as follows: 125 sol u medrol , 2.5 albutero enroute. Oral temp of 99.1, BLG of 128, 161/81 BP. PMH COPD and HTN. EMS placed IV 20g in LAC.

## 2024-10-14 NOTE — ED Notes (Signed)
 ED TO INPATIENT HANDOFF REPORT  ED Nurse Name and Phone #: [phone]  S Name/Age/Gender [name] Police 63 y.o. female Room/Bed: APA04/APA04  Code Status   Code Status: Full Code  Home/SNF/Other Skilled nursing facility Patient oriented to: self and place Is this baseline? Yes   Triage Complete: Triage complete  Chief Complaint COPD with acute exacerbation [facility]) [J44.1]  Triage Note Pt arrived via [facility] from [facility] nursing home cc of SOB. EMS endorses that the pt is normally on 3L [place]. Upon arrival EMS noted that the pt was on 3L of O2 and SpO2 was 89%. EMS endorses placing the pt on  NRB 15L for increase of SpO2 to above 94%. EMS endorses that they gave the pt as follows: 125 sol u medrol , 2.5 albutero enroute. Oral temp of 99.1, BLG of 128, 161/81 BP. PMH COPD and HTN. EMS placed IV 20g in LAC.    Allergies Allergies  Allergen Reactions   Carvedilol  Palpitations   Benicar [Olmesartan] Swelling   Codeine Other (See Comments)    Confusion    Sulfa Antibiotics Swelling    Whole face swells   Trulicity [Dulaglutide] Diarrhea   Ceftriaxone  Hives and Rash    Level of Care/Admitting Diagnosis ED Disposition     ED Disposition  Admit   Condition  --   Comment  Hospital Area: [facility] [[id number]]  Level of Care: Telemetry [5]  Diagnosis: COPD with acute exacerbation [facility]) [[id number]]  Admitting Physician: RAENELLE CORIA [[id number]]  Attending Physician: RAENELLE CORIA [[id number]]          B Medical/Surgery History Past Medical History:  Diagnosis Date   Anemia    Arthritis    Asthma    Cancer of sigmoid (HCC)    COPD (chronic obstructive pulmonary disease) (HCC)    Depression    Diabetes mellitus    Diastolic dysfunction 05/15/2015   Dyspnea    Generalized weakness    Hyperlipidemia    Hypertension    Hypothyroidism    Obesity    Palpitations    Pneumonia    PONV (postoperative nausea and vomiting)    Prolonged QT interval 05/14/2015    Sleep apnea    Past Surgical History:  Procedure Laterality Date   BIOPSY  04/06/2022   Procedure: BIOPSY;  Surgeon: Eartha Angelia Sieving, MD;  Location: [facility];  Service: Gastroenterology;;   CATARACT EXTRACTION     CESAREAN SECTION     CHOLECYSTECTOMY     COLONOSCOPY WITH PROPOFOL  N/A 04/06/2022   Procedure: COLONOSCOPY WITH PROPOFOL ;  Surgeon: Eartha Angelia Sieving, MD;  Location: [facility];  Service: Gastroenterology;  Laterality: N/A;  805 ASA 2 patient in [facility]   CYSTOSCOPY WITH INJECTION N/A 02/16/2024   Procedure: CYSTOSCOPY WITH INJECTION- Bladder botox  100 units;  Surgeon: Sherrilee Belvie CROME, MD;  Location: [facility];  Service: Urology;  Laterality: N/A;   ESOPHAGOGASTRODUODENOSCOPY (EGD) WITH PROPOFOL  N/A 04/06/2022   Procedure: ESOPHAGOGASTRODUODENOSCOPY (EGD) WITH PROPOFOL ;  Surgeon: Eartha Angelia Sieving, MD;  Location: [facility];  Service: Gastroenterology;  Laterality: N/A;   HEMOSTASIS CLIP PLACEMENT  04/06/2022   Procedure: HEMOSTASIS CLIP PLACEMENT;  Surgeon: Eartha Angelia Sieving, MD;  Location: [facility];  Service: Gastroenterology;;   POLYPECTOMY  04/06/2022   Procedure: POLYPECTOMY;  Surgeon: Eartha Angelia Sieving, MD;  [facility];  Service: Gastroenterology;;   SUBMUCOSAL TATTOO INJECTION  04/06/2022   Procedure: SUBMUCOSAL TATTOO INJECTION;  Surgeon: Eartha Angelia Sieving, MD;  Location: [facility];  Service: Gastroenterology;;     A IV Location/Drains/Wounds Patient Lines/Drains/Airways Status     Active Line/Drains/Airways     Name Placement date Placement time Site Days   Peripheral IV 10/14/24 20 G 1 Left Antecubital 10/14/24  0415  Antecubital  less than 1            Intake/Output Last 24 hours No intake or output data in the 24 hours ending 10/14/24 1216  Labs/Imaging Results for orders placed or performed during the hospital encounter of 10/14/24 (from the past 48 hours)   Resp panel by RT-PCR (RSV, Flu A&B, Covid) Anterior Nasal Swab     Status: None   Collection Time: 10/14/24  4:16 AM   Specimen: Anterior Nasal Swab  Result Value Ref Range   SARS Coronavirus 2 by RT PCR NEGATIVE NEGATIVE    Comment: (NOTE) SARS-CoV-2 target nucleic acids are NOT DETECTED.  The SARS-CoV-2 RNA is generally detectable in upper respiratory specimens during the acute phase of infection. The lowest concentration of SARS-CoV-2 viral copies this assay can detect is 138 copies/mL. A negative result does not preclude SARS-Cov-2 infection and should not be used as the sole basis for treatment or other patient management decisions. A negative result may occur with  improper specimen collection/handling, submission of specimen other than nasopharyngeal swab, presence of viral mutation(s) within the areas targeted by this assay, and inadequate number of viral copies(<138 copies/mL). A negative result must be combined with clinical observations, patient history, and epidemiological information. The expected result is Negative.  Fact Sheet for Patients:  bloggercourse.com  Fact Sheet for Healthcare Providers:  seriousbroker.it  This test is no t yet approved or cleared by the [place]  FDA and  has been authorized for detection and/or diagnosis of SARS-CoV-2 by FDA under an Emergency Use Authorization (EUA). This EUA will remain  in effect (meaning this test can be used) for the duration of the COVID-19 declaration under Section 564(b)(1) of the Act, 21 U.S.C.section 360bbb-3(b)(1), unless the authorization is terminated  or revoked sooner.       Influenza A by PCR NEGATIVE NEGATIVE   Influenza B by PCR NEGATIVE NEGATIVE    Comment: (NOTE) The Xpert Xpress SARS-CoV-2/FLU/RSV plus assay is intended as an aid in the diagnosis of influenza from Nasopharyngeal swab specimens and should not be used as a sole basis for  treatment. Nasal washings and aspirates are unacceptable for Xpert Xpress SARS-CoV-2/FLU/RSV testing.  Fact Sheet for Patients: bloggercourse.com  Fact Sheet for Healthcare Providers: seriousbroker.it  This test is not yet approved or cleared by the [place]  FDA and has been authorized for detection and/or diagnosis of SARS-CoV-2 by FDA under an Emergency Use Authorization (EUA). This EUA will remain in effect (meaning this test can be used) for the duration of the COVID-19 declaration under Section 564(b)(1) of the Act, 21 U.S.C. section 360bbb-3(b)(1), unless the authorization is terminated or revoked.     Resp Syncytial Virus by PCR NEGATIVE NEGATIVE    Comment: (NOTE) Fact Sheet for Patients: bloggercourse.com  Fact Sheet for Healthcare Providers: seriousbroker.it  This test is not yet approved or cleared by the [place]  FDA and has been authorized for detection and/or diagnosis of SARS-CoV-2 by FDA under an Emergency Use Authorization (EUA). This EUA will remain in effect (meaning this test can be used) for the duration of the COVID-19 declaration under Section 564(b)(1) of the Act, 21 U.S.C. section 360bbb-3(b)(1), unless the authorization is terminated or revoked.  Performed at [facility], [address] [place], [place] [address]   Comprehensive metabolic panel     Status: Abnormal   Collection Time: 10/14/24  4:25 AM  Result Value Ref Range   Sodium 143 135 - 145 mmol/L   Potassium 4.1 3.5 - 5.1 mmol/L   Chloride 100 98 - 111 mmol/L   CO2 37 (H) 22 - 32 mmol/L   Glucose, Bld 107 (H) 70 - 99 mg/dL    Comment: Glucose reference range applies only to samples taken after fasting for at least 8 hours.   BUN 36 (H) 8 - 23 mg/dL   Creatinine, Ser 7.56 (H) 0.44 - 1.00 mg/dL   Calcium  9.6 8.9 - 10.3 mg/dL   Total Protein 6.9 6.5 - 8.1 g/dL   Albumin 3.7 3.5 -  5.0 g/dL   AST 15 15 - 41 U/L   ALT 11 0 - 44 U/L   Alkaline Phosphatase 101 38 - 126 U/L   Total Bilirubin 0.4 0.0 - 1.2 mg/dL   GFR, Estimated 22 (L) >60 mL/min    Comment: (NOTE) Calculated using the CKD-EPI Creatinine Equation (2021)    Anion gap 6 5 - 15    Comment: Performed at [facility], [address] [place], [place] [address]  Pro Brain natriuretic peptide     Status: Abnormal   Collection Time: 10/14/24  4:25 AM  Result Value Ref Range   Pro Brain Natriuretic Peptide 853.0 (H) <300.0 pg/mL    Comment: (NOTE) Age Group        Cut-Points    Interpretation  < 50 years     450 pg/mL       NT-proBNP > 450 pg/mL indicates                                ADHF is likely              50 to 75 years  900 pg/mL      NT-proBNP > 900 pg/mL indicates          ADHF is likely  > 75 years      1800 pg/mL     NT-proBNP > 1800 pg/mL indicates          ADHF is likely                           All ages    Results between       Indeterminate. Further clinical             300 and the cut-   information is needed to determine            point for age group   if ADHF is present.                                                             Elecsys proBNP II/ Elecsys proBNP II STAT           Cut-Point                       Interpretation  300 pg/mL  NT-proBNP <300pg/mL indicates                             ADHF is not likely  Performed at [facility], [address], [place], [place] [address]   Blood gas, venous     Status: Abnormal   Collection Time: 10/14/24  4:25 AM  Result Value Ref Range   pH, Ven 7.36 7.25 - 7.43   pCO2, Ven 72 (HH) 44 - 60 mmHg    Comment: CRITICAL RESULT CALLED TO, READ BACK BY AND VERIFIED WITH: KENNON,J ON 10/14/24 AT 0439 BY PURDIE,J    pO2, Ven 42 32 - 45 mmHg   Bicarbonate 41.2 (H) 20.0 - 28.0 mmol/L   Acid-Base Excess 12.4 (H) 0.0 - 2.0 mmol/L   O2 Saturation 71.3 %   Patient temperature 36.8    Collection site LEFT ANTECUBITAL     Drawn by [phone]     Comment: Performed at [facility], [address] [place], [place] [address]  CBC with Differential/Platelet     Status: Abnormal   Collection Time: 10/14/24  4:25 AM  Result Value Ref Range   WBC 9.3 4.0 - 10.5 K/uL   RBC 3.67 (L) 3.87 - 5.11 MIL/uL   Hemoglobin 10.0 (L) 12.0 - 15.0 g/dL   HCT 67.6 (L) 63.9 - 53.9 %   MCV 88.0 80.0 - 100.0 fL   MCH 27.2 26.0 - 34.0 pg   MCHC 31.0 30.0 - 36.0 g/dL   RDW 85.1 88.4 - 84.4 %   Platelets 301 150 - 400 K/uL   nRBC 0.0 0.0 - 0.2 %   Neutrophils Relative % 85 %   Neutro Abs 7.8 (H) 1.7 - 7.7 K/uL   Lymphocytes Relative 8 %   Lymphs Abs 0.8 0.7 - 4.0 K/uL   Monocytes Relative 4 %   Monocytes Absolute 0.4 0.1 - 1.0 K/uL   Eosinophils Relative 3 %   Eosinophils Absolute 0.3 0.0 - 0.5 K/uL   Basophils Relative 0 %   Basophils Absolute 0.0 0.0 - 0.1 K/uL   Immature Granulocytes 0 %   Abs Immature Granulocytes 0.04 0.00 - 0.07 K/uL    Comment: Performed at [facility], [address] [place], [place] [address]  Magnesium      Status: Abnormal   Collection Time: 10/14/24  4:25 AM  Result Value Ref Range   Magnesium  2.5 (H) 1.7 - 2.4 mg/dL    Comment: Performed at [facility], [address] [place], [place] [address]  Troponin T, High Sensitivity     Status: Abnormal   Collection Time: 10/14/24  4:25 AM  Result Value Ref Range   Troponin T High Sensitivity 78 (H) 0 - 19 ng/L    Comment: (NOTE) Biotin concentrations > 1000 ng/mL falsely decrease TnT results.  Serial cardiac troponin measurements are suggested.  Refer to the Links section for chest pain algorithms and additional  guidance. Performed at [facility], [address], [place], [place] [address]   Troponin T, High Sensitivity     Status: Abnormal   Collection Time: 10/14/24  6:12 AM  Result Value Ref Range   Troponin T High Sensitivity 79 (H) 0 - 19 ng/L    Comment: (NOTE) Biotin concentrations > 1000 ng/mL falsely decrease TnT results.  Serial  cardiac troponin measurements are suggested.  Refer to the Links section for chest pain algorithms and additional  guidance. Performed at [facility], [phone]  [address], [place], [place] [address]   CBG monitoring, ED     Status: Abnormal   Collection Time: 10/14/24 11:48 AM  Result Value Ref Range   Glucose-Capillary 143 (H) 70 - 99 mg/dL    Comment: Glucose reference range applies only to samples taken after fasting for at least 8 hours.   DG Chest Port 1 View Result Date: 10/14/2024 EXAM: 1 VIEW(S) XRAY OF THE CHEST 10/14/2024 04:38:55 AM COMPARISON: 09/07/2024 CLINICAL HISTORY: Shortness of breath FINDINGS: LUNGS AND PLEURA: Generalized increased pulmonary interstitial markings have not significantly changed from last month. Stable lung volumes. Patchy and streaky increased bibasilar lung opacity, indeterminate for atelectasis versus infection. No pleural effusion. No pneumothorax. No pulmonary edema. HEART AND MEDIASTINUM: Stable cardiomegaly. BONES AND SOFT TISSUES: No acute osseous abnormality. IMPRESSION: 1. Patchy and streaky increased bibasilar lung opacity since last month, indeterminate for atelectasis versus infection. 2. Stable generalized increased pulmonary interstitium; might be chronic lung disease rather than pulmonary edema. Electronically signed by: Helayne Hurst MD 10/14/2024 05:15 AM EST RP Workstation: [id number]    Pending Labs Unresulted Labs (From admission, onward)     Start     Ordered   10/15/24 0500  Basic metabolic panel  Daily,   R      10/14/24 0845   10/14/24 0915  TSH  Add-on,   AD        10/14/24 0914   10/14/24 0844  Hemoglobin A1c  Once,   R       Comments: To assess prior glycemic control    10/14/24 0845   10/14/24 0844  HIV Antibody (routine testing w rflx)  (HIV Antibody (Routine testing w reflex) panel)  Once,   R        10/14/24 0845            Vitals/Pain Today's Vitals   10/14/24 1000 10/14/24 1015 10/14/24 1020 10/14/24 1032  BP: (!)  159/85 (!) 149/75    Pulse: 83 85    Resp: 18 17    Temp:    98.2 F (36.8 C)  TempSrc:    Oral  SpO2: (!) 89% 90% 91%     Isolation Precautions No active isolations  Medications Medications  furosemide  (LASIX ) injection 80 mg (has no administration in time range)  alum & mag hydroxide-simeth (MAALOX/MYLANTA) 200-200-20 MG/5ML suspension 30 mL (has no administration in time range)  (feeding supplement) PROSource Plus liquid 30 mL (0 mLs Oral Hold 10/14/24 1043)  amLODipine  (NORVASC ) tablet 10 mg (10 mg Oral Given 10/14/24 0948)  ascorbic acid  (VITAMIN C ) tablet 500 mg (500 mg Oral Given 10/14/24 0948)  aspirin  EC tablet 81 mg (81 mg Oral Given 10/14/24 0947)  atorvastatin  (LIPITOR) tablet 10 mg (has no administration in time range)  cholecalciferol (VITAMIN D3) 25 MCG (1000 UNIT) tablet 2,000 Units (2,000 Units Oral Given 10/14/24 0948)  docusate sodium  (COLACE) capsule 100 mg (100 mg Oral Given 10/14/24 0948)  estradiol  (ESTRACE ) vaginal cream 1 Applicatorful (1 Applicatorful Vaginal Given 10/14/24 1045)  fluticasone furoate-vilanterol (BREO ELLIPTA ) 100-25 MCG/ACT 1 puff (1 puff Inhalation Given 10/14/24 1020)  gabapentin  (NEURONTIN ) capsule 300 mg (300 mg Oral Given 10/14/24 0948)  levothyroxine  (SYNTHROID ) tablet 88 mcg (88 mcg Oral Given 10/14/24 0947)  insulin  glargine-yfgn (SEMGLEE ) injection 50 Units (50 Units Subcutaneous Given 10/14/24 1045)  multivitamin with minerals tablet 1 tablet (1 tablet Oral Given 10/14/24 0947)  pantoprazole  (PROTONIX ) EC tablet 40 mg (40 mg Oral Given 10/14/24 0948)  potassium chloride  SA (KLOR-CON  M)  CR tablet 40 mEq (40 mEq Oral Given 10/14/24 0948)  saccharomyces boulardii (FLORASTOR) capsule 250 mg (has no administration in time range)  sertraline  (ZOLOFT ) tablet 100 mg (100 mg Oral Given 10/14/24 0948)  insulin  aspart (novoLOG ) injection 0-15 Units (2 Units Subcutaneous Given 10/14/24 1155)  insulin  aspart (novoLOG ) injection 0-5 Units (has no  administration in time range)  heparin  injection 5,000 Units (has no administration in time range)  acetaminophen  (TYLENOL ) tablet 650 mg (has no administration in time range)    Or  acetaminophen  (TYLENOL ) suppository 650 mg (has no administration in time range)  ondansetron  (ZOFRAN ) tablet 4 mg (has no administration in time range)    Or  ondansetron  (ZOFRAN ) injection 4 mg (has no administration in time range)  albuterol  (PROVENTIL ) (2.5 MG/3ML) 0.083% nebulizer solution 2.5 mg (2.5 mg Nebulization Given 10/14/24 1020)  perflutren  lipid microspheres (DEFINITY ) IV suspension (2 mLs Intravenous Given 10/14/24 1017)  furosemide  (LASIX ) injection 40 mg (40 mg Intravenous Given 10/14/24 0523)  ipratropium-albuterol  (DUONEB) 0.5-2.5 (3) MG/3ML nebulizer solution 3 mL (3 mLs Nebulization Given 10/14/24 0530)    Mobility non-ambulatory     Focused Assessments    R Recommendations: See Admitting Provider Note  Report given to:   Additional Notes:

## 2024-10-14 NOTE — ED Notes (Signed)
 Facility updated via incoming call

## 2024-10-14 NOTE — ED Notes (Signed)
 Xray in room with pt

## 2024-10-14 NOTE — ED Notes (Signed)
 This RN was made aware by [facility] in the lab of the pts critical lab PCO2 of 72. This RN made MD Dixon aware. Awaiting orders.

## 2024-10-14 NOTE — Plan of Care (Signed)
   Problem: Education: Goal: Ability to describe self-care measures that may prevent or decrease complications (Diabetes Survival Skills Education) will improve Outcome: Progressing   Problem: Coping: Goal: Ability to adjust to condition or change in health will improve Outcome: Progressing   Problem: Nutritional: Goal: Maintenance of adequate nutrition will improve Outcome: Progressing   Problem: Education: Goal: Knowledge of General Education information will improve Description: Including pain rating scale, medication(s)/side effects and non-pharmacologic comfort measures Outcome: Progressing

## 2024-10-14 NOTE — ED Provider Notes (Signed)
 [place] EMERGENCY DEPARTMENT AT [facility] Provider Note   CSN: [id number] Arrival date & time: 10/14/24  9642     Patient presents with: No chief complaint on file.   [name] is a 63 y.o. female.   HPI Patient presents for an shortness of breath.  Medical history includes HTN, COPD, DM, OSA, HLD, anemia, colon cancer, IBS, neuropathy, depression.  She wears 3 L of oxygen  at baseline.  She resides at [facility] nursing facility.  She has been having increased shortness of breath over the past 3 days.  Symptoms worsened tonight and EMS was called.  EMS noted a SpO2 of 82% on her baseline 3 L.  She was placed on a nonrebreather.  She received 125 mg of Solu-Medrol  and 5 mg of albuterol  prior to arrival.  She did have improvement in her blood oxygenation and was placed back on her 3 L.  Patient reports mild improvement in her symptoms following breathing treatments.  She denies any current areas of discomfort.  She states that she did have central chest pain 2 days ago.  She denies any other recent symptoms of concern.    Prior to Admission medications   Medication Sig Start Date End Date Taking? Authorizing Provider  albuterol  (VENTOLIN  HFA) 108 (90 Base) MCG/ACT inhaler Inhale 2 puffs into the lungs every 6 (six) hours as needed for wheezing or shortness of breath.    [provider]  alum & mag hydroxide-simeth (MAALOX/MYLANTA) 200-200-20 MG/5ML suspension Take 30 mLs by mouth every 2 (two) hours as needed for indigestion or heartburn. Do not exceed 4 doses in 24 hours    [provider]  Amino Acids-Protein Hydrolys (FEEDING SUPPLEMENT, PRO-STAT 64,) LIQD Take 30 mLs by mouth daily.    [provider]  amLODipine  (NORVASC ) 10 MG tablet Take 10 mg by mouth daily. 12/30/23   [provider]  ascorbic acid  (VITAMIN C ) 500 MG tablet Take 500 mg by mouth 2 (two) times daily.    [provider]  aspirin  EC 81 MG tablet Take  81 mg by mouth daily. Swallow whole.    [provider]  atorvastatin  (LIPITOR) 10 MG tablet Take 10 mg by mouth at bedtime. 09/29/21   [provider]  augmented betamethasone dipropionate (DIPROLENE-AF) 0.05 % cream Apply 1 application  topically 2 (two) times daily. Apply to trunk, buttocks, legs for Psoriasis    [provider]  BD AUTOSHIELD DUO 30G X 5 MM MISC  12/31/23   [provider]  Biotin 10 MG TABS Take 10 mg by mouth daily. Patient not taking: Reported on 07/24/2024    [provider]  cetirizine (ZYRTEC) 10 MG tablet Take 10 mg by mouth daily.    [provider]  Cholecalciferol (VITAMIN D) 50 MCG (2000 UT) CAPS Take 2,000 Units by mouth daily.    [provider]  clotrimazole-betamethasone (LOTRISONE) cream Apply topically. 03/29/24   [provider]  Cranberry 300 MG tablet Take 300 mg by mouth 2 (two) times daily.    [provider]  docusate sodium  (COLACE) 100 MG capsule Take 100 mg by mouth daily.    [provider]  estradiol  (ESTRACE ) 0.1 MG/GM vaginal cream Discard plastic applicator. Insert a blueberry size amount (approximately 1 gram) of cream on fingertip inside vagina at bedtime every night for 1 week then every other night routinely (for long term use). 09/09/23   Gerldine Lauraine BROCKS, FNP  fluticasone furoate-vilanterol (BREO  ELLIPTA) 100-25 MCG/ACT AEPB Inhale 1 puff into the lungs daily. 07/02/23   Pearlean Manus, MD  fluticasone-salmeterol (ADVAIR) 250-50 MCG/ACT AEPB Inhale 1 puff into the lungs 2 (two) times daily. 12/24/23   [provider]  furosemide  (LASIX ) 20 MG tablet Take 20 mg by mouth daily. 03/29/22   [provider]  gabapentin  (NEURONTIN ) 300 MG capsule Take 1 capsule in AM, 1 capsule in PM 09/30/23   Georjean Darice HERO, MD  hydrALAZINE  (APRESOLINE ) 25 MG tablet Take 1 tablet (25 mg total) by mouth every 8 (eight) hours. 11/26/21   Johnson, Clanford L, MD   LANTUS  100 UNIT/ML injection Inject 0.7 mLs (70 Units total) into the skin at bedtime. 04/16/24   Therisa Benton PARAS, NP  levothyroxine  (SYNTHROID ) 88 MCG tablet Take 88 mcg by mouth daily before breakfast.    [provider]  Menthol-Zinc  Oxide (CALMOSEPTINE) 0.44-20.6 % OINT Apply 1 Application topically daily as needed (preservation/protection after incontinent care).    [provider]  Multiple Vitamins-Minerals (MULTIVITAMIN WITH MINERALS) tablet Take 1 tablet by mouth daily.    [provider]  MYRBETRIQ  50 MG TB24 tablet Take 1 tablet (50 mg total) by mouth daily. Patient not taking: Reported on 07/24/2024 08/04/23   Gerldine Lauraine BROCKS, FNP  NOVOLOG  100 UNIT/ML injection Inject 14-20 Units into the skin 3 (three) times daily with meals. 90-150= 14 units; 151-200= 15 units; 201-250= 16 units; 251-300= 17 units; 301-350= 18 units; 351-400= 19 units; above 400 = 20 units 04/16/24   Therisa Benton PARAS, NP  omeprazole (PRILOSEC) 20 MG capsule Take 20 mg by mouth daily.    [provider]  ondansetron  (ZOFRAN ) 8 MG tablet Take 8 mg by mouth 2 (two) times daily. 12/09/23   [provider]  OXYGEN  Inhale 2 L into the lungs continuous.    [provider]  potassium chloride  SA (K-DUR) 20 MEQ tablet Take 20 mEq by mouth daily.    [provider]  saccharomyces boulardii (FLORASTOR) 250 MG capsule Take 250 mg by mouth 2 (two) times daily.    [provider]  sertraline  (ZOLOFT ) 100 MG tablet Take 100 mg by mouth daily. 03/31/23   [provider]  trimethoprim  (TRIMPEX ) 100 MG tablet Take 1 tablet (100 mg total) by mouth daily. 09/09/23   Gerldine Lauraine BROCKS, FNP    Allergies: Carvedilol , Benicar [olmesartan], Codeine, Sulfa antibiotics, Trulicity [dulaglutide], and Ceftriaxone     Review of Systems  Respiratory:  Positive for shortness of breath.   Cardiovascular:  Positive for chest pain.  All other systems reviewed and are  negative.   Updated Vital Signs BP (!) 152/66   Pulse 84   Temp 98.3 F (36.8 C) (Oral)   Resp (!) 23   LMP 08/06/2012   SpO2 94%   Physical Exam Vitals and nursing note reviewed.  Constitutional:      General: She is not in acute distress.    Appearance: Normal appearance. She is well-developed. She is not toxic-appearing or diaphoretic.  HENT:     Head: Normocephalic and atraumatic.     Right Ear: External ear normal.     Left Ear: External ear normal.     Nose: Nose normal.     Mouth/Throat:     Mouth: Mucous membranes are moist.  Eyes:     Extraocular Movements: Extraocular movements intact.     Conjunctiva/sclera: Conjunctivae normal.  Cardiovascular:     Rate and Rhythm: Normal rate and regular rhythm.  Heart sounds: No murmur heard. Pulmonary:     Effort: Pulmonary effort is normal. No respiratory distress.     Breath sounds: Decreased breath sounds present.  Abdominal:     General: There is no distension.     Palpations: Abdomen is soft.     Tenderness: There is no abdominal tenderness.  Musculoskeletal:        General: No swelling or deformity. Normal range of motion.     Cervical back: Normal range of motion and neck supple.  Skin:    General: Skin is warm and dry.     Coloration: Skin is not jaundiced or pale.  Neurological:     General: No focal deficit present.     Mental Status: She is alert and oriented to person, place, and time.  Psychiatric:        Mood and Affect: Mood normal.        Behavior: Behavior normal.     (all labs ordered are listed, but only abnormal results are displayed) Labs Reviewed  COMPREHENSIVE METABOLIC PANEL WITH GFR - Abnormal; Notable for the following components:      Result Value   CO2 37 (*)    Glucose, Bld 107 (*)    BUN 36 (*)    Creatinine, Ser 2.43 (*)    GFR, Estimated 22 (*)    All other components within normal limits  PRO BRAIN NATRIURETIC PEPTIDE - Abnormal; Notable for the following components:    Pro Brain Natriuretic Peptide 853.0 (*)    All other components within normal limits  BLOOD GAS, VENOUS - Abnormal; Notable for the following components:   pCO2, Ven 72 (*)    Bicarbonate 41.2 (*)    Acid-Base Excess 12.4 (*)    All other components within normal limits  CBC WITH DIFFERENTIAL/PLATELET - Abnormal; Notable for the following components:   RBC 3.67 (*)    Hemoglobin 10.0 (*)    HCT 32.3 (*)    Neutro Abs 7.8 (*)    All other components within normal limits  MAGNESIUM  - Abnormal; Notable for the following components:   Magnesium  2.5 (*)    All other components within normal limits  TROPONIN T, HIGH SENSITIVITY - Abnormal; Notable for the following components:   Troponin T High Sensitivity 78 (*)    All other components within normal limits  TROPONIN T, HIGH SENSITIVITY - Abnormal; Notable for the following components:   Troponin T High Sensitivity 79 (*)    All other components within normal limits  RESP PANEL BY RT-PCR (RSV, FLU A&B, COVID)  [id number]    EKG: EKG Interpretation Date/Time:  Sunday October 14 2024 04:11:23 EST Ventricular Rate:  87 PR Interval:  132 QRS Duration:  127 QT Interval:  407 QTC Calculation: 490 R Axis:   -55  Text Interpretation: Sinus rhythm Nonspecific IVCD with LAD Left ventricular hypertrophy Nonspecific T abnrm, anterolateral leads Confirmed by Melvenia Motto [phone]) on 10/14/2024 5:47:13 AM  Radiology: [facility] Chest Port 1 View Result Date: 10/14/2024 EXAM: 1 VIEW(S) XRAY OF THE CHEST 10/14/2024 04:38:55 AM COMPARISON: 09/07/2024 CLINICAL HISTORY: Shortness of breath FINDINGS: LUNGS AND PLEURA: Generalized increased pulmonary interstitial markings have not significantly changed from last month. Stable lung volumes. Patchy and streaky increased bibasilar lung opacity, indeterminate for atelectasis versus infection. No pleural effusion. No pneumothorax. No pulmonary edema. HEART AND MEDIASTINUM: Stable cardiomegaly. BONES AND SOFT TISSUES: No  acute osseous abnormality. IMPRESSION: 1. Patchy and streaky increased bibasilar lung opacity since last month,  indeterminate for atelectasis versus infection. 2. Stable generalized increased pulmonary interstitium; might be chronic lung disease rather than pulmonary edema. Electronically signed by: Helayne Hurst MD 10/14/2024 05:15 AM EST RP Workstation: [id number]     Procedures   Medications Ordered in the ED  furosemide  (LASIX ) injection 40 mg (40 mg Intravenous Given 10/14/24 0523)  ipratropium-albuterol  (DUONEB) 0.5-2.5 (3) MG/3ML nebulizer solution 3 mL (3 mLs Nebulization Given 10/14/24 0530)                                    Medical Decision Making Amount and/or Complexity of Data Reviewed Labs: ordered. Radiology: ordered.  Risk Prescription drug management.   This patient presents to the ED for concern of shortness of breath, this involves an extensive number of treatment options, and is a complaint that carries with it a high risk of complications and morbidity.  The differential diagnosis includes COPD exacerbation, CHF, pneumonia, acidosis, anemia   Co morbidities / Chronic conditions that complicate the patient evaluation  HTN, COPD, DM, OSA, HLD, anemia, colon cancer, IBS, neuropathy, depression   Additional history obtained:  Additional history obtained from EMR External records from outside source obtained and reviewed including EMS   Lab Tests:  I Ordered, and personally interpreted labs.  The pertinent results include: Similar creatinine to lab work from 1 month ago, similar anemia, no leukocytosis, hypomagnesemia with otherwise normal electrolytes,, stated hypercarbia on blood gas.  Troponin and BNP elevated consistent with lab work from a month ago.   Imaging Studies ordered:  I ordered imaging studies including chest x-ray I independently visualized and interpreted imaging which showed bibasilar atelectasis, increased pulmonary interstitium I agree  with the radiologist interpretation   Cardiac Monitoring: / EKG:  The patient was maintained on a cardiac monitor.  I personally viewed and interpreted the cardiac monitored which showed an underlying rhythm of: Sinus rhythm   Problem List / ED Course / Critical interventions / Medication management  Patient presents for 3 days of shortness of breath.  Found to have acute on chronic hypoxia with EMS.  This did improve after breathing treatments given prior to arrival.  On arrival, patient endorses improved but ongoing shortness of breath.  She denies any other current symptoms.  On exam, she is able to speak in complete sentences.  Current breathing is unlabored.  On lung auscultation, she does have diminished breath sounds.  Workup was initiated.  Patient's lab work notable for similar creatinine, BNP, and troponin to lab work from 1 month ago.  During that prior visit, patient was encouraged to get admitted but declined.  Today, she has continued dyspnea with conversation.  This is not normal for her.  She is agreeable to admission.  Lasix  was ordered for diuresis.  Patient was admitted for further management. I ordered medication including DuoNeb for COPD; Lasix  for diuresis Reevaluation of the patient after these medicines showed that the patient improved I have reviewed the patients home medicines and have made adjustments as needed  Social Determinants of Health:  Resides in nursing facility     Final diagnoses:  COPD exacerbation (HCC)  Acute on chronic congestive heart failure, unspecified heart failure type [facility])    ED Discharge Orders     None          Melvenia Motto, MD 10/14/24 [phone]

## 2024-10-14 NOTE — H&P (Signed)
 History and Physical    [name] FMW:[id number] DOB: 06-16-1961 DOA: 10/14/2024  PCP: Patient, No Pcp Per  Patient coming from: Long-term nursing home  I have personally briefly reviewed patient's old medical records available.   Chief Complaint: Shortness of breath, cannot lie flat  HPI: [name] is a 63 y.o. female with medical history significant of morbid obesity, immobility and mostly wheelchair-bound, untreated sleep apnea, chronic hypoxemia on 3 to 4 L of oxygen  at nursing home, CKD stage IV, type 2 diabetes with peripheral neuropathy, hypertension, colon cancer s/p resection in remission, diastolic dysfunction brought from nursing home with gradual worsening shortness of breath even at rest and unable to lie flat.  Patient is poor historian.  Nursing home records reviewed.  Husband is at the bedside who lives at home, patient has been living at nursing home for last 2 years, he visits her every day.  According to the patient and husband she has been more bloated these days, she has difficulty laying flat and gets short of breath even at rest.  Does have some wheezing but no cough.  Denies any fever chills or recent URI symptoms.  According to the patient's husband, they did try some injection of Lasix  and at the nursing home without much success. At baseline, patient is on wheelchair and mostly bedbound.  Hardly can walk from bed to the wheelchair few steps. ED Course: Sleepy, blood pressure stable, on 3 L oxygen .  Creatinine 2.43 which is at about baseline.  Chest x-ray with poor quality however,, it does show chronic atelectatic changes.  Previous echocardiogram with normal ejection fraction. PVC with CO2 of 72, pH 7.36.  Compensated.  Patient could not tolerate CPAP in the past. proBNP 853 that is at about her historic levels. Patient was given 1 dose of nebulizer and 40 mg of Lasix  in the ER and admission requested due to significant symptoms.  Review of Systems:  all systems are reviewed and pertinent positive as per HPI otherwise rest are negative.    Past Medical History:  Diagnosis Date   Anemia    Arthritis    Asthma    Cancer of sigmoid (HCC)    COPD (chronic obstructive pulmonary disease) (HCC)    Depression    Diabetes mellitus    Diastolic dysfunction 05/15/2015   Dyspnea    Generalized weakness    Hyperlipidemia    Hypertension    Hypothyroidism    Obesity    Palpitations    Pneumonia    PONV (postoperative nausea and vomiting)    Prolonged QT interval 05/14/2015   Sleep apnea     Past Surgical History:  Procedure Laterality Date   BIOPSY  04/06/2022   Procedure: BIOPSY;  Surgeon: Eartha Angelia Sieving, MD;  Location: [facility];  Service: Gastroenterology;;   CATARACT EXTRACTION     CESAREAN SECTION     CHOLECYSTECTOMY     COLONOSCOPY WITH PROPOFOL  N/A 04/06/2022   Procedure: COLONOSCOPY WITH PROPOFOL ;  Surgeon: Eartha Angelia Sieving, MD;  Location: [facility];  Service: Gastroenterology;  Laterality: N/A;  805 ASA 2 patient in [facility]   CYSTOSCOPY WITH INJECTION N/A 02/16/2024   Procedure: CYSTOSCOPY WITH INJECTION- Bladder botox  100 units;  Surgeon: Sherrilee Belvie CROME, MD;  Location: [facility];  Service: Urology;  Laterality: N/A;   ESOPHAGOGASTRODUODENOSCOPY (EGD) WITH PROPOFOL  N/A 04/06/2022   Procedure: ESOPHAGOGASTRODUODENOSCOPY (EGD) WITH PROPOFOL ;  Surgeon: Eartha Angelia Sieving, MD;  Location: [facility];  Service: Gastroenterology;  Laterality: N/A;   HEMOSTASIS CLIP PLACEMENT  04/06/2022   Procedure: HEMOSTASIS CLIP PLACEMENT;  Surgeon: Eartha Angelia Sieving, MD;  Location: [facility];  Service: Gastroenterology;;   POLYPECTOMY  04/06/2022   Procedure: POLYPECTOMY;  Surgeon: Eartha Angelia Sieving, MD;  [facility]: [facility];  Service: Gastroenterology;;   SUBMUCOSAL TATTOO INJECTION  04/06/2022   Procedure: SUBMUCOSAL TATTOO INJECTION;  Surgeon: Eartha Angelia Sieving, MD;  Location: [facility];  Service: Gastroenterology;;    Social history   reports that she has never smoked. She has been exposed to tobacco smoke. She has never used smokeless tobacco. She reports that she does not drink alcohol and does not use drugs.  Allergies  Allergen Reactions   Carvedilol  Palpitations   Benicar [Olmesartan] Swelling   Codeine Other (See Comments)    Confusion    Sulfa Antibiotics Swelling    Whole face swells   Trulicity [Dulaglutide] Diarrhea   Ceftriaxone  Hives and Rash    Family History  Problem Relation Age of Onset   Stroke Mother    Diabetes Father    Heart failure Father    Hypertension Father    Diabetes Sister    Heart failure Sister    Hypertension Sister    Stroke Sister    Cancer Other    Stroke Sister      Prior to Admission medications   Medication Sig Start Date End Date Taking? Authorizing Provider  albuterol  (VENTOLIN  HFA) 108 (90 Base) MCG/ACT inhaler Inhale 2 puffs into the lungs every 6 (six) hours as needed for wheezing or shortness of breath.    [provider]  alum & mag hydroxide-simeth (MAALOX/MYLANTA) 200-200-20 MG/5ML suspension Take 30 mLs by mouth every 2 (two) hours as needed for indigestion or heartburn. Do not exceed 4 doses in 24 hours    [provider]  Amino Acids-Protein Hydrolys (FEEDING SUPPLEMENT, PRO-STAT 64,) LIQD Take 30 mLs by mouth daily.    [provider]  amLODipine  (NORVASC ) 10 MG tablet Take 10 mg by mouth daily. 12/30/23   [provider]  ascorbic acid  (VITAMIN C ) 500 MG tablet Take 500 mg by mouth 2 (two) times daily.    [provider]  aspirin  EC 81 MG tablet Take 81 mg by mouth daily. Swallow whole.    [provider]  atorvastatin  (LIPITOR) 10 MG tablet Take 10 mg by mouth at bedtime. 09/29/21   [provider]  augmented betamethasone dipropionate (DIPROLENE-AF) 0.05 % cream Apply 1 application  topically 2 (two) times  daily. Apply to trunk, buttocks, legs for Psoriasis    [provider]  BD AUTOSHIELD DUO 30G X 5 MM MISC  12/31/23   [provider]  Biotin 10 MG TABS Take 10 mg by mouth daily. Patient not taking: Reported on 07/24/2024    [provider]  cetirizine (ZYRTEC) 10 MG tablet Take 10 mg by mouth daily.    [provider]  Cholecalciferol (VITAMIN D) 50 MCG (2000 UT) CAPS Take 2,000 Units by mouth daily.    [provider]  clotrimazole-betamethasone (LOTRISONE) cream Apply topically. 03/29/24   [provider]  Cranberry 300 MG tablet Take 300 mg by mouth 2 (two) times daily.    [provider]  docusate sodium  (COLACE) 100 MG capsule Take 100 mg by mouth daily.    [provider]  estradiol  (ESTRACE ) 0.1 MG/GM vaginal cream Discard plastic applicator. Insert a blueberry size amount (approximately 1  gram) of cream on fingertip inside vagina at bedtime every night for 1 week then every other night routinely (for long term use). 09/09/23   Gerldine Lauraine BROCKS, FNP  fluticasone furoate-vilanterol (BREO ELLIPTA ) 100-25 MCG/ACT AEPB Inhale 1 puff into the lungs daily. 07/02/23   Pearlean Manus, MD  fluticasone-salmeterol (ADVAIR) 250-50 MCG/ACT AEPB Inhale 1 puff into the lungs 2 (two) times daily. 12/24/23   [provider]  furosemide  (LASIX ) 20 MG tablet Take 20 mg by mouth daily. 03/29/22   [provider]  gabapentin  (NEURONTIN ) 300 MG capsule Take 1 capsule in AM, 1 capsule in PM 09/30/23   Georjean Darice HERO, MD  hydrALAZINE  (APRESOLINE ) 25 MG tablet Take 1 tablet (25 mg total) by mouth every 8 (eight) hours. 11/26/21   Johnson, Clanford L, MD  LANTUS  100 UNIT/ML injection Inject 0.7 mLs (70 Units total) into the skin at bedtime. 04/16/24   Therisa Benton PARAS, NP  levothyroxine  (SYNTHROID ) 88 MCG tablet Take 88 mcg by mouth daily before breakfast.    [provider]  Menthol-Zinc  Oxide (CALMOSEPTINE) 0.44-20.6 %  OINT Apply 1 Application topically daily as needed (preservation/protection after incontinent care).    [provider]  Multiple Vitamins-Minerals (MULTIVITAMIN WITH MINERALS) tablet Take 1 tablet by mouth daily.    [provider]  MYRBETRIQ  50 MG TB24 tablet Take 1 tablet (50 mg total) by mouth daily. Patient not taking: Reported on 07/24/2024 08/04/23   Gerldine Lauraine BROCKS, FNP  NOVOLOG  100 UNIT/ML injection Inject 14-20 Units into the skin 3 (three) times daily with meals. 90-150= 14 units; 151-200= 15 units; 201-250= 16 units; 251-300= 17 units; 301-350= 18 units; 351-400= 19 units; above 400 = 20 units 04/16/24   Therisa Benton PARAS, NP  omeprazole (PRILOSEC) 20 MG capsule Take 20 mg by mouth daily.    [provider]  ondansetron  (ZOFRAN ) 8 MG tablet Take 8 mg by mouth 2 (two) times daily. 12/09/23   [provider]  OXYGEN  Inhale 2 L into the lungs continuous.    [provider]  potassium chloride  SA (K-DUR) 20 MEQ tablet Take 20 mEq by mouth daily.    [provider]  saccharomyces boulardii (FLORASTOR) 250 MG capsule Take 250 mg by mouth 2 (two) times daily.    [provider]  sertraline  (ZOLOFT ) 100 MG tablet Take 100 mg by mouth daily. 03/31/23   [provider]  trimethoprim  (TRIMPEX ) 100 MG tablet Take 1 tablet (100 mg total) by mouth daily. 09/09/23   Gerldine Lauraine BROCKS, FNP    Physical Exam: Vitals:   10/14/24 0405 10/14/24 0414 10/14/24 0439 10/14/24 0500  BP: (!) 144/60 (!) 144/60  (!) 152/66  Pulse: 88 87  84  Resp:  16  (!) 23  Temp: 98.7 F (37.1 C) 98.3 F (36.8 C)    TempSrc: Oral Oral    SpO2: 90% (!) 3% 94% 94%    Constitutional: NAD, calm, chronically sick looking.  Very frail.  Morbidly obese.  Sleeping comfortably on approach to interview.  Wakes up and answers appropriately. Vitals:   10/14/24 0405 10/14/24 0414 10/14/24 0439 10/14/24 0500  BP: (!) 144/60 (!) 144/60  (!) 152/66  Pulse: 88 87  84   Resp:  16  (!) 23  Temp: 98.7 F (37.1 C) 98.3 F (36.8 C)    TempSrc: Oral Oral    SpO2: 90% (!) 3% 94% 94%   Eyes: PERRL, lids and conjunctivae normal ENMT: Mucous membranes are moist. Posterior  pharynx clear of any exudate or lesions.Normal dentition.  Neck: normal, supple, no masses, no thyromegaly, difficult to look for JVD. Respiratory: Poor bilateral air entry.  Not in any distress.  No added sounds.  Poor inspiratory effort.  On 3 L of oxygen .  No accessory muscle use.  Cardiovascular: Regular rate and rhythm, no murmurs / rubs / gallops.  Chronic stasis changes both legs, 2+ nonpitting edema both legs.  2+ pedal pulses. No carotid bruits.  Abdomen: no tenderness, no masses palpated. No hepatosplenomegaly. Bowel sounds positive.  Obese and pendulous.  Nontender. Musculoskeletal: no clubbing / cyanosis. No joint deformity upper and lower extremities.  Grossly weak.   Neurologic: CN 2-12 grossly intact. Sensation intact, DTR normal. Strength 5/5 in all 4 but grossly weak. Psychiatric: Flat affect.  Intermittently laughing.    Labs on Admission: I have personally reviewed following labs and imaging studies  CBC: Recent Labs  Lab 10/14/24 0425  WBC 9.3  NEUTROABS 7.8*  HGB 10.0*  HCT 32.3*  MCV 88.0  PLT 301   Basic Metabolic Panel: Recent Labs  Lab 10/14/24 0425  NA 143  K 4.1  CL 100  CO2 37*  GLUCOSE 107*  BUN 36*  CREATININE 2.43*  CALCIUM  9.6  MG 2.5*   GFR: CrCl cannot be calculated (Unknown ideal weight.). Liver Function Tests: Recent Labs  Lab 10/14/24 0425  AST 15  ALT 11  ALKPHOS 101  BILITOT 0.4  PROT 6.9  ALBUMIN 3.7   No results for input(s): LIPASE, AMYLASE in the last 168 hours. No results for input(s): AMMONIA in the last 168 hours. Coagulation Profile: No results for input(s): INR, PROTIME in the last 168 hours. Cardiac Enzymes: No results for input(s): CKTOTAL, CKMB, CKMBINDEX, TROPONINI in the last 168  hours. BNP (last 3 results) Recent Labs    09/07/24 1853 10/14/24 0425  PROBNP 832.0* 853.0*   HbA1C: No results for input(s): HGBA1C in the last 72 hours. CBG: No results for input(s): GLUCAP in the last 168 hours. Lipid Profile: No results for input(s): CHOL, HDL, LDLCALC, TRIG, CHOLHDL, LDLDIRECT in the last 72 hours. Thyroid  Function Tests: No results for input(s): TSH, T4TOTAL, FREET4, T3FREE, THYROIDAB in the last 72 hours. Anemia Panel: No results for input(s): VITAMINB12, FOLATE, FERRITIN, TIBC, IRON, RETICCTPCT in the last 72 hours. Urine analysis:    Component Value Date/Time   COLORURINE YELLOW 11/12/2023 1001   APPEARANCEUR Clear 03/01/2024 1432   LABSPEC 1.013 11/12/2023 1001   PHURINE 5.0 11/12/2023 1001   GLUCOSEU Negative 03/01/2024 1432   HGBUR NEGATIVE 11/12/2023 1001   BILIRUBINUR Negative 03/01/2024 1432   KETONESUR NEGATIVE 11/12/2023 1001   PROTEINUR Urine 03/01/2024 1432   PROTEINUR 100 (A) 11/12/2023 1001   UROBILINOGEN 0.2 05/15/2015 0112   NITRITE Negative 03/01/2024 1432   NITRITE NEGATIVE 11/12/2023 1001   LEUKOCYTESUR Negative 03/01/2024 1432   LEUKOCYTESUR NEGATIVE 11/12/2023 1001    Radiological Exams on Admission: DG Chest Port 1 View Result Date: 10/14/2024 EXAM: 1 VIEW(S) XRAY OF THE CHEST 10/14/2024 04:38:55 AM COMPARISON: 09/07/2024 CLINICAL HISTORY: Shortness of breath FINDINGS: LUNGS AND PLEURA: Generalized increased pulmonary interstitial markings have not significantly changed from last month. Stable lung volumes. Patchy and streaky increased bibasilar lung opacity, indeterminate for atelectasis versus infection. No pleural effusion. No pneumothorax. No pulmonary edema. HEART AND MEDIASTINUM: Stable cardiomegaly. BONES AND SOFT TISSUES: No acute osseous abnormality. IMPRESSION: 1. Patchy and streaky increased bibasilar lung opacity since last month, indeterminate for atelectasis versus infection. 2.  Stable generalized increased pulmonary interstitium; might be chronic lung disease rather than pulmonary edema. Electronically signed by: Helayne Hurst MD 10/14/2024 05:15 AM EST RP Workstation: [id number]    EKG: Independently reviewed.  Low voltage EKG.  No acute changes.  Assessment/Plan Principal Problem:   Acute on chronic diastolic CHF (congestive heart failure) (HCC) Active Problems:   COPD (chronic obstructive pulmonary disease) (HCC)   Depression   OSA (obstructive sleep apnea)   Morbid obesity with BMI of 50.0-59.9, adult (HCC)   Chronic venous stasis   Hyperglycemia due to diabetes mellitus (HCC)   Polyneuropathy associated with underlying disease   Hypertension associated with diabetes (HCC)   COPD with acute exacerbation (HCC)     1.  Shortness of breath, multifactorial in this patient with Morbid obesity and hypoventilation syndrome, intolerance to CPAP.  Currently compensated respiratory acidosis. Suspect acute on chronic diastolic heart failure given presentation with orthopnea, weight gain, PND. Underlying COPD, deconditioning. Admit to hospital given severity of symptoms.  Repeat echocardiogram.  Close monitoring of renal functions. Lasix  80 mg IV twice daily, currently unable to start on beta-blockers.  Need to monitor renal functions before restarting GDMT. Intake output monitoring.  Daily weight.  May need higher doses of diuretics if does not respond given CKD stage IV as well as morbid obesity. Keep on oxygen  to keep saturation more than 90%. Incentive spirometry as much she can do it.  2.  COPD: No evidence of exacerbation.  Continue Breo, albuterol  and chest physiotherapy.  3.  Type 2 diabetes, uncontrolled with hyperglycemia: Resume home dose of insulin  and monitor.  She is on gabapentin  for neuropathy, continue.  4.  Hypertension: As above.  Currently on hydralazine  and amlodipine .  Will continue.  5.  Depression, on sertraline   6.  Hypothyroidism: On  thyroxine.  Continue.  Will check TSH.  7.  AKI on CKD stage IV: Her creatinine is gradually trending up.  Creatinine 2.43 today.  Was about same last month.  Expect to get worse with diuresis.  May benefit with nephrology involvement.  I will send message to nephrology to see patient tomorrow.  8.   Goal of care: Severe medical comorbidities.  Bedbound.  Now with CKD stage IV and along with fluid overload.  Currently remains full code.  Will involve palliative care team to start discussing goal of care, CODE STATUS.  Unsure if patient would be a dialysis candidate given severe morbidities.    DVT prophylaxis: Heparin  subcu Code Status: Full code Family Communication: Husband at bedside Disposition Plan: Back to nursing home when stable Consults called: Nephrology, will notify Admission status: Telemetry monitor.     Renato Applebaum MD [facility]

## 2024-10-14 NOTE — Plan of Care (Signed)
   Problem: Activity: Goal: Risk for activity intolerance will decrease Outcome: Progressing   Problem: Coping: Goal: Level of anxiety will decrease Outcome: Progressing

## 2024-10-15 DIAGNOSIS — J449 Chronic obstructive pulmonary disease, unspecified: Secondary | ICD-10-CM | POA: Diagnosis not present

## 2024-10-15 DIAGNOSIS — G4733 Obstructive sleep apnea (adult) (pediatric): Secondary | ICD-10-CM

## 2024-10-15 DIAGNOSIS — Z7951 Long term (current) use of inhaled steroids: Secondary | ICD-10-CM | POA: Diagnosis not present

## 2024-10-15 DIAGNOSIS — Z9981 Dependence on supplemental oxygen: Secondary | ICD-10-CM | POA: Diagnosis not present

## 2024-10-15 DIAGNOSIS — E662 Morbid (severe) obesity with alveolar hypoventilation: Secondary | ICD-10-CM | POA: Diagnosis present

## 2024-10-15 DIAGNOSIS — E1165 Type 2 diabetes mellitus with hyperglycemia: Secondary | ICD-10-CM | POA: Diagnosis present

## 2024-10-15 DIAGNOSIS — Z515 Encounter for palliative care: Secondary | ICD-10-CM

## 2024-10-15 DIAGNOSIS — Z6841 Body Mass Index (BMI) 40.0 and over, adult: Secondary | ICD-10-CM

## 2024-10-15 DIAGNOSIS — N179 Acute kidney failure, unspecified: Secondary | ICD-10-CM | POA: Diagnosis present

## 2024-10-15 DIAGNOSIS — I130 Hypertensive heart and chronic kidney disease with heart failure and stage 1 through stage 4 chronic kidney disease, or unspecified chronic kidney disease: Secondary | ICD-10-CM | POA: Diagnosis present

## 2024-10-15 DIAGNOSIS — I509 Heart failure, unspecified: Secondary | ICD-10-CM | POA: Diagnosis not present

## 2024-10-15 DIAGNOSIS — C19 Malignant neoplasm of rectosigmoid junction: Secondary | ICD-10-CM | POA: Diagnosis present

## 2024-10-15 DIAGNOSIS — J9620 Acute and chronic respiratory failure, unspecified whether with hypoxia or hypercapnia: Secondary | ICD-10-CM | POA: Diagnosis not present

## 2024-10-15 DIAGNOSIS — J9611 Chronic respiratory failure with hypoxia: Secondary | ICD-10-CM | POA: Diagnosis present

## 2024-10-15 DIAGNOSIS — Z7401 Bed confinement status: Secondary | ICD-10-CM | POA: Diagnosis not present

## 2024-10-15 DIAGNOSIS — E1142 Type 2 diabetes mellitus with diabetic polyneuropathy: Secondary | ICD-10-CM | POA: Diagnosis present

## 2024-10-15 DIAGNOSIS — I5033 Acute on chronic diastolic (congestive) heart failure: Secondary | ICD-10-CM | POA: Diagnosis present

## 2024-10-15 DIAGNOSIS — N1832 Chronic kidney disease, stage 3b: Secondary | ICD-10-CM | POA: Diagnosis present

## 2024-10-15 DIAGNOSIS — D631 Anemia in chronic kidney disease: Secondary | ICD-10-CM | POA: Diagnosis present

## 2024-10-15 DIAGNOSIS — Z66 Do not resuscitate: Secondary | ICD-10-CM | POA: Diagnosis present

## 2024-10-15 DIAGNOSIS — J441 Chronic obstructive pulmonary disease with (acute) exacerbation: Secondary | ICD-10-CM | POA: Diagnosis present

## 2024-10-15 DIAGNOSIS — E039 Hypothyroidism, unspecified: Secondary | ICD-10-CM | POA: Diagnosis present

## 2024-10-15 DIAGNOSIS — R0602 Shortness of breath: Secondary | ICD-10-CM | POA: Diagnosis present

## 2024-10-15 DIAGNOSIS — N184 Chronic kidney disease, stage 4 (severe): Secondary | ICD-10-CM

## 2024-10-15 DIAGNOSIS — Z794 Long term (current) use of insulin: Secondary | ICD-10-CM | POA: Diagnosis not present

## 2024-10-15 DIAGNOSIS — Z7982 Long term (current) use of aspirin: Secondary | ICD-10-CM | POA: Diagnosis not present

## 2024-10-15 DIAGNOSIS — F32A Depression, unspecified: Secondary | ICD-10-CM | POA: Diagnosis present

## 2024-10-15 DIAGNOSIS — E782 Mixed hyperlipidemia: Secondary | ICD-10-CM | POA: Diagnosis present

## 2024-10-15 DIAGNOSIS — J9612 Chronic respiratory failure with hypercapnia: Secondary | ICD-10-CM | POA: Diagnosis present

## 2024-10-15 DIAGNOSIS — Z79890 Hormone replacement therapy (postmenopausal): Secondary | ICD-10-CM | POA: Diagnosis not present

## 2024-10-15 DIAGNOSIS — I878 Other specified disorders of veins: Secondary | ICD-10-CM | POA: Diagnosis not present

## 2024-10-15 DIAGNOSIS — E1122 Type 2 diabetes mellitus with diabetic chronic kidney disease: Secondary | ICD-10-CM | POA: Diagnosis present

## 2024-10-15 DIAGNOSIS — E66813 Obesity, class 3: Secondary | ICD-10-CM | POA: Diagnosis not present

## 2024-10-15 LAB — BASIC METABOLIC PANEL WITH GFR
Anion gap: 6 (ref 5–15)
BUN: 40 mg/dL — ABNORMAL HIGH (ref 8–23)
CO2: 37 mmol/L — ABNORMAL HIGH (ref 22–32)
Calcium: 9.7 mg/dL (ref 8.9–10.3)
Chloride: 99 mmol/L (ref 98–111)
Creatinine, Ser: 2.51 mg/dL — ABNORMAL HIGH (ref 0.44–1.00)
GFR, Estimated: 21 mL/min — ABNORMAL LOW (ref >60)
Glucose, Bld: 212 mg/dL — ABNORMAL HIGH (ref 70–99)
Potassium: 4.5 mmol/L (ref 3.5–5.1)
Sodium: 142 mmol/L (ref 135–145)

## 2024-10-15 LAB — GLUCOSE, CAPILLARY
Glucose-Capillary: 188 mg/dL — ABNORMAL HIGH (ref 70–99)
Glucose-Capillary: 269 mg/dL — ABNORMAL HIGH (ref 70–99)
Glucose-Capillary: 319 mg/dL — ABNORMAL HIGH (ref 70–99)
Glucose-Capillary: 337 mg/dL — ABNORMAL HIGH (ref 70–99)

## 2024-10-15 MED ORDER — NYSTATIN 100000 UNIT/GM EX POWD
Freq: Three times a day (TID) | CUTANEOUS | Status: DC
  Filled 2024-10-15: qty 15

## 2024-10-15 NOTE — Plan of Care (Signed)
  Problem: Education: Goal: Ability to describe self-care measures that may prevent or decrease complications (Diabetes Survival Skills Education) will improve Outcome: Progressing   Problem: Nutritional: Goal: Progress toward achieving an optimal weight will improve Outcome: Progressing   Problem: Skin Integrity: Goal: Risk for impaired skin integrity will decrease Outcome: Progressing   Problem: Tissue Perfusion: Goal: Adequacy of tissue perfusion will improve Outcome: Progressing

## 2024-10-15 NOTE — Care Management Obs Status (Signed)
 MEDICARE OBSERVATION STATUS NOTIFICATION   Patient Details  Name: [name] MRN: [id number] Date of Birth: [birth year]   Medicare Observation Status Notification Given:  Yes    Kashden Deboy LITTIE Ada 10/15/2024, 12:20 PM

## 2024-10-15 NOTE — Progress Notes (Signed)
 Progress Note   Patient: [name] FMW:[id number] DOB: 1961-03-15 DOA: 10/14/2024     0 DOS: the patient was seen and examined on 10/15/2024   Brief hospital admission narrative: As per H&P written by Dr. Raenelle on 10/14/2024 [name] Police is a 63 y.o. female with medical history significant of morbid obesity, immobility and mostly wheelchair-bound, untreated sleep apnea, chronic hypoxemia on 3 to 4 L of oxygen  at nursing home, CKD stage IV, type 2 diabetes with peripheral neuropathy, hypertension, colon cancer s/p resection in remission, diastolic dysfunction brought from nursing home with gradual worsening shortness of breath even at rest and unable to lie flat.  Patient is poor historian.  Nursing home records reviewed.  Husband is at the bedside who lives at home, patient has been living at nursing home for last 2 years, he visits her every day.  According to the patient and husband she has been more bloated these days, she has difficulty laying flat and gets short of breath even at rest.  Does have some wheezing but no cough.  Denies any fever chills or recent URI symptoms.  According to the patient's husband, they did try some injection of Lasix  and at the nursing home without much success. At baseline, patient is on wheelchair and mostly bedbound.  Hardly can walk from bed to the wheelchair few steps. ED Course: Sleepy, blood pressure stable, on 3 L oxygen .  Creatinine 2.43 which is at about baseline.  Chest x-ray with poor quality however,, it does show chronic atelectatic changes.  Previous echocardiogram with normal ejection fraction. PVC with CO2 of 72, pH 7.36.  Compensated.  Patient could not tolerate CPAP in the past. proBNP 853 that is at about her historic levels. Patient was given 1 dose of nebulizer and 40 mg of Lasix  in the ER and admission requested due to significant symptoms.  Assessment and plan 1-shortness of breath -Multifactorial in the setting of obesity,  hypoventilation syndrome, obstructive sleep apnea, underlying history of COPD and acute on chronic diastolic heart failure - Acute saturation on chronic supplementation - Patient reports feeling better - Continue IV diuresis - Low-sodium diet discussed with patient - Follow nephrology service recommendation - Continue to follow daily weights, strict I's and O's and low-sodium diet.  2-stage IV renal failure - Making more difficult ongoing diuresis process - In the past has been found not the best candidate for dialysis - Nephrology service has been consulted and will follow recommendation - Continue current diuresis - Minimize nephrotoxic agents and avoid hypotension and the use of contrast.  3-morbid obesity -Body mass index is 59.07 kg/m.  -Low-calorie diet and portion control discussed with patient.  4-type 2 diabetes mellitus with nephropathy and neuropathy - Continue to follow CBG fluctuation - Sliding scale insulin  and long-acting insulin  in place. - Continue gabapentin .  5-depression - Continue sertraline  - No suicidal ideation or hallucination.  6-hypothyroidism - Continue Synthroid . -TSH WNL.   Subjective:  Feeling slightly better today. Still with signs of fluid overload. No fever, no CP, no nausea or vomiting.  Physical Exam: Vitals:   10/15/24 0154 10/15/24 0337 10/15/24 0828 10/15/24 1224  BP: (!) 156/68 (!) 144/64  (!) 155/76  Pulse: 79 77  80  Resp: 18 16    Temp: 99.2 F (37.3 C) 99 F (37.2 C)  99.2 F (37.3 C)  TempSrc: Oral Oral  Oral  SpO2: 95% 97% 95% 93%  Weight:  (!) 141.8 kg    Height:  General exam: Alert, awake, oriented x 3; chronically ill in appearance.  In no major distress. Respiratory system: Decreased breath sounds at the bases; no wheezing.  No using accessory muscles. Cardiovascular system: Unable to assess JVD due to body habitus; rate controlled, no rubs, no gallops. Gastrointestinal system: Abdomen is obese,  nondistended, soft and nontender. No organomegaly or masses felt. Normal bowel sounds heard. Central nervous system: Alert and oriented. No focal neurological deficits. Extremities: No cyanosis or clubbing; 2-3 edema bilaterally. Skin: No petechiae. Psychiatry: Judgement and insight appear normal.  And affect on exam.  Data Reviewed: Basic metabolic panel: Sodium 142, potassium 4.5, chloride 99, bicarb 37, BUN 40, creatinine 2.51 and GFR 21   Family Communication: No family at bedside.  Disposition: Status is: Inpatient Remains inpatient appropriate because: Continue IV diuresis.  Time spent: 50 minutes  Author: Eric Nunnery, MD 10/15/2024 4:03 PM  For on call review [email or web address].

## 2024-10-15 NOTE — Consult Note (Signed)
                                                                                   Consultation Note Date: 10/15/2024   Patient Name: ANNALIA METZGER  DOB: [birth year]  MRN: [id number]  Age / Sex: 63 y.o., female  PCP: Patient, No Pcp Per Referring Physician: Ricky Fines, MD  Reason for Consultation:   HPI/Patient Profile: 63 y.o. female  with past medical history of *** admitted on 10/14/2024 with ***.   Primary Decision Maker {Primary Decision Fjxzm:78612}  Discussion: ***    SUMMARY OF RECOMMENDATIONS   *** Code Status/Advance Care Planning:   Code Status: Full Code    Prognosis:   {Palliative Care Prognosis:23504}  Discharge Planning: {Palliative dispostion:23505}  Primary Diagnoses: Present on Admission:  COPD with acute exacerbation (HCC)  Hypertension associated with diabetes (HCC)  COPD (chronic obstructive pulmonary disease) (HCC)  Depression  OSA (obstructive sleep apnea)  Chronic venous stasis  Hyperglycemia due to diabetes mellitus (HCC)  Acute on chronic diastolic CHF (congestive heart failure) (HCC)  Polyneuropathy associated with underlying disease   Review of Systems  Physical Exam  Vital Signs: BP (!) 155/76 (BP Location: Right Arm)   Pulse 80   Temp 99.2 F (37.3 C) (Oral)   Resp 16   Ht 5' 1 (1.549 m)   Wt (!) 141.8 kg   LMP 08/06/2012   SpO2 93%   BMI 59.07 kg/m  Pain Scale: 0-10   Pain Score: 0-No pain   SpO2: SpO2: 93 % O2 Device:SpO2: 93 % O2 Flow Rate: .O2 Flow Rate (L/min): 4 L/min  IO: Intake/output summary:  Intake/Output Summary (Last 24 hours) at 10/15/2024 1514 Last data filed at 10/15/2024 0836 Gross per 24 hour  Intake 120 ml  Output 600 ml  Net -480 ml    LBM: Last BM Date : 10/13/24 Baseline Weight: Weight: (!) 141.6 kg Most recent weight: Weight: (!) 141.8 kg       Thank you for this consult. Palliative medicine will continue to follow and assist as needed.  Time Total: *** Signed by: Cassondra Stain, AGNP-C Palliative Medicine  Time includes:   Preparing to see the patient (e.g., review of tests) Obtaining and/or reviewing separately obtained history Performing a medically necessary appropriate examination and/or evaluation Counseling and educating the patient/family/caregiver Ordering medications, tests, or procedures Referring and communicating with other health care professionals (when not reported separately) Documenting clinical information in the electronic or other health record Independently interpreting results (not reported separately) and communicating results to the patient/family/caregiver Care coordination (not reported separately) Clinical documentation   Please contact Palliative Medicine Team phone at [phone] for questions and concerns.  For individual provider: See Tracey

## 2024-10-15 NOTE — Care Management Obs Status (Signed)
 MEDICARE OBSERVATION STATUS NOTIFICATION   Patient Details  Name: [name] MRN: [id number] Date of Birth: [birth year]   Medicare Observation Status Notification Given:   yes    Duwaine LITTIE Ada 10/15/2024, 12:20 PM

## 2024-10-15 NOTE — Progress Notes (Signed)
 Red Clips not completed due to BMI being 59.1.

## 2024-10-15 NOTE — TOC Initial Note (Signed)
 Transition of Care [facility]) - Initial/Assessment Note    Patient Details  Name: [name] MRN: [id number] Date of Birth: 1961-10-07  [facility] CM/SW Contact:    Mcarthur Saddie Kim, LCSW Phone Number: 10/15/2024, 8:31 AM  Clinical Narrative: Pt admitted due to acute on chronic diastolic CHF. Assessment completed with pt's husband as pt oriented x2 per chart. Pt's husband reports pt has been a resident at [facility] for about 2 years. He requests return to facility when medically stable. Per Ozell at [facility], okay to return. FL2 completed. TOC received consult for CHF screening. LCSW discussed with Ozell need for heart healthy diet and daily weights at facility.                   Expected Discharge Plan: Skilled Nursing Facility Barriers to Discharge: Continued Medical Work up   Patient Goals and CMS Choice Patient states their goals for this hospitalization and ongoing recovery are:: return to LTC   Choice offered to / list presented to : Spouse [place] ownership interest in [facility].provided to::  (n/a)    Expected Discharge Plan and Services In-house Referral: Clinical Social Work   Post Acute Care Choice: Skilled Nursing Facility Living arrangements for the past 2 months: Skilled Nursing Facility                                      Prior Living Arrangements/Services Living arrangements for the past 2 months: Skilled Nursing Facility Lives with:: Facility Resident Patient language and need for interpreter reviewed:: Yes Do you feel safe going back to the place where you live?: Yes      Need for Family Participation in Patient Care: Yes (Comment) Care giver support system in place?: Yes (comment)   Criminal Activity/Legal Involvement Pertinent to Current Situation/Hospitalization: No - Comment as needed  Activities of Daily Living   ADL Screening (condition at time of admission) Independently performs  ADLs?: Yes (appropriate for developmental age) Is the patient deaf or have difficulty hearing?: No Does the patient have difficulty seeing, even when wearing glasses/contacts?: No Does the patient have difficulty concentrating, remembering, or making decisions?: Yes  Permission Sought/Granted                  Emotional Assessment   Attitude/Demeanor/Rapport: Unable to Assess Affect (typically observed): Unable to Assess Orientation: : Oriented to Self, Oriented to Place Alcohol / Substance Use: Not Applicable Psych Involvement: No (comment)  Admission diagnosis:  COPD exacerbation (HCC) [J44.1] COPD with acute exacerbation (HCC) [J44.1] Acute on chronic congestive heart failure, unspecified heart failure type [facility]) [I50.9] Patient Active Problem List   Diagnosis Date Noted   COPD with acute exacerbation (HCC) 10/14/2024   Lobar pneumonia 11/16/2023   Acute on chronic respiratory failure with hypoxia and hypercapnia (HCC) 11/16/2023   Encephalopathy acute 11/12/2023   Acquired renal cyst of right kidney 08/04/2023   Mixed stress and urge incontinence 08/04/2023   Ambulatory dysfunction 08/04/2023   Urinary urgency 08/04/2023   Urge incontinence 08/04/2023   Urinary frequency 08/04/2023   Cognitive communication deficit 08/04/2023   Overactive bladder 08/02/2023   Recurrent UTI 08/02/2023   Left carpal tunnel syndrome 05/27/2023   Right carpal tunnel syndrome 05/27/2023   Polyneuropathy associated with underlying disease 09/20/2022   Sensorineural hearing loss (SNHL) of both ears 09/20/2022   Colon cancer (HCC) 08/11/2022  Cancer of sigmoid colon (HCC) 05/12/2022   Iron deficiency anemia    Pressure injury of skin 11/24/2021   Chronic venous stasis 11/22/2021   Leukocytosis 11/22/2021   Thrombocytosis 11/22/2021   Hyperglycemia due to diabetes mellitus (HCC) 11/22/2021   Lactic acidosis 11/22/2021   Hypoalbuminemia due to protein-calorie malnutrition 11/22/2021    Elevated liver enzymes 11/22/2021   Mixed hyperlipidemia 11/22/2021   GERD (gastroesophageal reflux disease) 11/22/2021   Acute on chronic diastolic CHF (congestive heart failure) (HCC) 11/22/2021   Diabetic foot infection (HCC) 11/22/2021   Uncontrolled type 2 diabetes mellitus with hyperglycemia, with long-term current use of insulin  (HCC) 11/22/2021   Lower extremity cellulitis 11/21/2021   Class 3 severe obesity with serious comorbidity and body mass index (BMI) of 50.0 to 59.9 in adult (HCC) 07/10/2021   Morbid obesity with BMI of 50.0-59.9, adult (HCC) 01/29/2020   OSA (obstructive sleep apnea) 12/21/2019   Asthma 12/21/2019   Current moderate episode of major depressive disorder (HCC) 01/16/2019   Stress incontinence of urine 01/16/2019   Irritable bowel syndrome with diarrhea 02/21/2018   Uterine prolapse 08/09/2017   Acquired hypothyroidism 09/27/2016   Anxiety, generalized 12/03/2015   Hypertension associated with diabetes (HCC) 08/26/2015   Diastolic dysfunction 05/15/2015   Prolonged QT interval 05/14/2015   Palpitations 07/23/2014   Generalized weakness 07/23/2014   Essential hypertension 07/22/2014   COPD (chronic obstructive pulmonary disease) (HCC) 07/22/2014   Depression 07/22/2014   PCP:  Patient, No Pcp Per Pharmacy:   [facility] - [place], [place] - [address] [place] [address] Phone: [phone] Fax: [phone]     Social Drivers of Health (SDOH) Social History: SDOH Screenings   Food Insecurity: No Food Insecurity (10/14/2024)  Housing: Low Risk  (10/14/2024)  Transportation Needs: No Transportation Needs (10/14/2024)  Utilities: Not At Risk (10/14/2024)  Financial Resource Strain: Medium Risk (08/14/2021)   Received from [facility]  Physical Activity: Inactive (08/14/2021)   Received from [facility]  Social Connections: Unknown (04/08/2022)   Received from [facility]  Stress: Stress  Concern Present (08/14/2021)   Received from [facility]  Tobacco Use: Medium Risk (10/14/2024)   SDOH Interventions:     Readmission Risk Interventions    11/14/2023   11:51 AM 06/27/2023    2:47 PM  Readmission Risk Prevention Plan  Transportation Screening Complete Complete  HRI or Home Care Consult Complete Complete  Social Work Consult for Recovery Care Planning/Counseling Complete Complete  Palliative Care Screening Not Applicable Not Applicable  Medication Review Oceanographer) Complete Complete

## 2024-10-15 NOTE — [place] FL2 (Signed)
 [place]  MEDICAID FL2 LEVEL OF CARE FORM     IDENTIFICATION  Patient Name: [name] Birthdate: 10/01/1961 Sex: female Admission Date (Current Location): 10/14/2024  [place] and [place] Number:  Raynaldo [id number] R Facility and Address:  [facility],  [address], [place] [address]      Provider Number: [id number]  Attending Physician Name and Address:  Ricky Fines, MD  Relative Name and Phone Number:       Current Level of Care: Hospital Recommended Level of Care: Skilled Nursing Facility Prior Approval Number:    Date Approved/Denied:   PASRR Number:    Discharge Plan: SNF    Current Diagnoses: Patient Active Problem List   Diagnosis Date Noted   COPD with acute exacerbation (HCC) 10/14/2024   Lobar pneumonia 11/16/2023   Acute on chronic respiratory failure with hypoxia and hypercapnia (HCC) 11/16/2023   Encephalopathy acute 11/12/2023   Acquired renal cyst of right kidney 08/04/2023   Mixed stress and urge incontinence 08/04/2023   Ambulatory dysfunction 08/04/2023   Urinary urgency 08/04/2023   Urge incontinence 08/04/2023   Urinary frequency 08/04/2023   Cognitive communication deficit 08/04/2023   Overactive bladder 08/02/2023   Recurrent UTI 08/02/2023   Left carpal tunnel syndrome 05/27/2023   Right carpal tunnel syndrome 05/27/2023   Polyneuropathy associated with underlying disease 09/20/2022   Sensorineural hearing loss (SNHL) of both ears 09/20/2022   Colon cancer (HCC) 08/11/2022   Cancer of sigmoid colon (HCC) 05/12/2022   Iron deficiency anemia    Pressure injury of skin 11/24/2021   Chronic venous stasis 11/22/2021   Leukocytosis 11/22/2021   Thrombocytosis 11/22/2021   Hyperglycemia due to diabetes mellitus (HCC) 11/22/2021   Lactic acidosis 11/22/2021   Hypoalbuminemia due to protein-calorie malnutrition 11/22/2021   Elevated liver enzymes 11/22/2021   Mixed hyperlipidemia 11/22/2021   GERD  (gastroesophageal reflux disease) 11/22/2021   Acute on chronic diastolic CHF (congestive heart failure) (HCC) 11/22/2021   Diabetic foot infection (HCC) 11/22/2021   Uncontrolled type 2 diabetes mellitus with hyperglycemia, with long-term current use of insulin  (HCC) 11/22/2021   Lower extremity cellulitis 11/21/2021   Class 3 severe obesity with serious comorbidity and body mass index (BMI) of 50.0 to 59.9 in adult (HCC) 07/10/2021   Morbid obesity with BMI of 50.0-59.9, adult (HCC) 01/29/2020   OSA (obstructive sleep apnea) 12/21/2019   Asthma 12/21/2019   Current moderate episode of major depressive disorder (HCC) 01/16/2019   Stress incontinence of urine 01/16/2019   Irritable bowel syndrome with diarrhea 02/21/2018   Uterine prolapse 08/09/2017   Acquired hypothyroidism 09/27/2016   Anxiety, generalized 12/03/2015   Hypertension associated with diabetes (HCC) 08/26/2015   Diastolic dysfunction 05/15/2015   Prolonged QT interval 05/14/2015   Palpitations 07/23/2014   Generalized weakness 07/23/2014   Essential hypertension 07/22/2014   COPD (chronic obstructive pulmonary disease) (HCC) 07/22/2014   Depression 07/22/2014    Orientation RESPIRATION BLADDER Height & Weight     Self, Place  O2 (3L) External catheter Weight: (!) 312 lb 9.8 oz (141.8 kg) Height:  5' 1 (154.9 cm)  BEHAVIORAL SYMPTOMS/MOOD NEUROLOGICAL BOWEL NUTRITION STATUS      Incontinent Diet (See d/c summary)  AMBULATORY STATUS COMMUNICATION OF NEEDS Skin   Extensive Assist Verbally Other (Comment) (Redness to bilateral buttocks)                       Personal Care Assistance Level of Assistance  Feeding, Bathing, Dressing Bathing Assistance: Maximum assistance  Feeding assistance: Limited assistance Dressing Assistance: Maximum assistance     Functional Limitations Info  Sight, Hearing, Speech Sight Info: Adequate Hearing Info: Adequate Speech Info: Adequate    SPECIAL CARE FACTORS FREQUENCY                        Contractures      Additional Factors Info  Code Status, Allergies, Insulin  Sliding Scale, Psychotropic, Isolation Precautions Code Status Info: Full Allergies Info: Carvedilol   Benicar (Olmesartan)  Codeine  Sulfa Antibiotics  Trulicity (Dulaglutide)  Ceftriaxone  Psychotropic Info: Zoloft    Isolation Precautions Info: MRSA 10/13/23     Current Medications (10/15/2024):  This is the current hospital active medication list Current Facility-Administered Medications  Medication Dose Route Frequency Provider Last Rate Last Admin   (feeding supplement) PROSource Plus liquid 30 mL  30 mL Oral Daily Ghimire, Renato, MD       acetaminophen  (TYLENOL ) tablet 650 mg  650 mg Oral Q6H PRN Raenelle Renato, MD       Or   acetaminophen  (TYLENOL ) suppository 650 mg  650 mg Rectal Q6H PRN Raenelle Renato, MD       albuterol  (PROVENTIL ) (2.5 MG/3ML) 0.083% nebulizer solution 2.5 mg  2.5 mg Nebulization Q2H PRN Ghimire, Kuber, MD   2.5 mg at 10/14/24 1020   alum & mag hydroxide-simeth (MAALOX/MYLANTA) 200-200-20 MG/5ML suspension 30 mL  30 mL Oral Q2H PRN Raenelle Renato, MD       amLODipine  (NORVASC ) tablet 10 mg  10 mg Oral Daily Ghimire, Kuber, MD   10 mg at 10/14/24 0948   ascorbic acid  (VITAMIN C ) tablet 500 mg  500 mg Oral BID Ghimire, Kuber, MD   500 mg at 10/14/24 2144   aspirin  EC tablet 81 mg  81 mg Oral Daily Ghimire, Kuber, MD   81 mg at 10/14/24 0947   atorvastatin  (LIPITOR) tablet 10 mg  10 mg Oral QHS Ghimire, Kuber, MD   10 mg at 10/14/24 2143   cholecalciferol (VITAMIN D3) 25 MCG (1000 UNIT) tablet 2,000 Units  2,000 Units Oral Daily Raenelle Renato, MD   2,000 Units at 10/14/24 0948   docusate sodium  (COLACE) capsule 100 mg  100 mg Oral Daily Ghimire, Kuber, MD   100 mg at 10/14/24 0948   estradiol  (ESTRACE ) vaginal cream 1 Applicatorful  1 Applicatorful Vaginal Daily Raenelle Renato, MD   1 Applicatorful at 10/14/24 1045   fluticasone furoate-vilanterol (BREO  ELLIPTA) 100-25 MCG/ACT 1 puff  1 puff Inhalation Daily Raenelle Renato, MD   1 puff at 10/14/24 1020   furosemide  (LASIX ) injection 80 mg  80 mg Intravenous BID Ghimire, Kuber, MD   80 mg at 10/14/24 1526   gabapentin  (NEURONTIN ) capsule 300 mg  300 mg Oral BID Ghimire, Kuber, MD   300 mg at 10/14/24 2143   heparin  injection 5,000 Units  5,000 Units Subcutaneous Q8H Raenelle Renato, MD   5,000 Units at 10/15/24 0535   insulin  aspart (novoLOG ) injection 0-15 Units  0-15 Units Subcutaneous TID WC Ghimire, Kuber, MD   11 Units at 10/14/24 1637   insulin  aspart (novoLOG ) injection 0-5 Units  0-5 Units Subcutaneous QHS Ghimire, Kuber, MD   4 Units at 10/14/24 2144   insulin  glargine-yfgn (SEMGLEE ) injection 50 Units  50 Units Subcutaneous Daily Ghimire, Kuber, MD   50 Units at 10/14/24 1045   levothyroxine  (SYNTHROID ) tablet 88 mcg  88 mcg Oral QAC breakfast Raenelle Renato, MD   88 mcg at 10/15/24 0533  multivitamin with minerals tablet 1 tablet  1 tablet Oral Daily Ghimire, Kuber, MD   1 tablet at 10/14/24 0947   ondansetron  (ZOFRAN ) tablet 4 mg  4 mg Oral Q6H PRN Ghimire, Kuber, MD       Or   ondansetron  (ZOFRAN ) injection 4 mg  4 mg Intravenous Q6H PRN Ghimire, Kuber, MD       pantoprazole  (PROTONIX ) EC tablet 40 mg  40 mg Oral Daily Ghimire, Kuber, MD   40 mg at 10/14/24 0948   potassium chloride  SA (KLOR-CON  M) CR tablet 40 mEq  40 mEq Oral Daily Ghimire, Kuber, MD   40 mEq at 10/14/24 9051   saccharomyces boulardii (FLORASTOR) capsule 250 mg  250 mg Oral BID Ghimire, Kuber, MD   250 mg at 10/14/24 2145   sertraline  (ZOLOFT ) tablet 100 mg  100 mg Oral Daily Ghimire, Kuber, MD   100 mg at 10/14/24 9051     Discharge Medications: Please see discharge summary for a list of discharge medications.  Relevant Imaging Results:  Relevant Lab Results:   Additional Information    Mcarthur Saddie Kim, LCSW

## 2024-10-15 NOTE — Inpatient Diabetes Management (Signed)
 Inpatient Diabetes Program Recommendations  AACE/ADA: New Consensus Statement on Inpatient Glycemic Control (2015)  Target Ranges:  Prepandial:   less than 140 mg/dL      Peak postprandial:   less than 180 mg/dL (1-2 hours)      Critically ill patients:  140 - 180 mg/dL   Lab Results  Component Value Date   GLUCAP 188 (H) 10/15/2024   HGBA1C 7.2 (H) 10/14/2024   Diabetes history: DM2 Outpatient Diabetes medications:  Lantus  70 units daily Novolog  14-20 units tid Current orders for Inpatient glycemic control: Semglee  50 units daily Novolog  0-15 units tid, 0-5 units hs  Inpatient Diabetes Program Recommendations:   Please consider: -Novolog  5 units tid if PP CBGs >180  Thank you, Dagoberto E. Jamareon Shimel, RN, MSN, CNS, CDCES  Diabetes Coordinator Inpatient Glycemic Control Team Team Pager [phone] (8am-5pm) 10/15/2024 9:15 AM

## 2024-10-15 NOTE — Plan of Care (Signed)
   Problem: Coping: Goal: Ability to adjust to condition or change in health will improve Outcome: Progressing   Problem: Skin Integrity: Goal: Risk for impaired skin integrity will decrease Outcome: Progressing

## 2024-10-15 NOTE — Consult Note (Signed)
 Nephrology Consult   Requesting provider: Dr. Raenelle Service requesting consult: [facility] Reason for consult: AKI on CKD3b   Assessment/Recommendations:   AKI on CKD3b -followed by Dr. Marcelino ([facility]). Suspecting AKI presumed to be secondary to cardiorenal -checking urine sediment and renal ultrasound (especially in the context of recent botox  and possibly urinary retention as per patient) -diuretics: continue with lasix  80mg  IV BID -not an ideal long term HD candidate given her underlying functional status (bed bound), would recommend against this. Briefly discussed this with patient today -Avoid nephrotoxic medications including NSAIDs and iodinated intravenous contrast exposure unless the latter is absolutely indicated.  Preferred narcotic agents for pain control are hydromorphone , fentanyl , and methadone. Morphine  should not be used. Avoid Baclofen and avoid oral sodium phosphate  and magnesium  citrate based laxatives / bowel preps. Continue strict Input and Output monitoring. Will monitor the patient closely with you and intervene or adjust therapy as indicated by changes in clinical status/labs    Acute on chronic hypoxic respiratory failure -possibly related to acute on chronic COPD exacerbation/obesity hypoventilation syndrome with intolerance to CPAP. Concern for CHF exacerbation -TTE this admit with normal right sided pressures and EF 60-65%, normal diastolic parameters -consider steroids? Will defer to primary  HTN -c/w home meds, BP currently acceptable  Anemia due to chronic disease: -Transfuse for Hgb<7 g/dL. Hgb 10  Uncontrolled Diabetes Mellitus Type 2 with Hyperglycemia -per primary  GOC: recommend palliative consult  Recommendations conveyed to primary service.    [name] 10/15/2024 7:38 AM   _____________________________________________________________________________________   History of Present Illness: [name] is  a/an 63 y.o. female with a past medical history of CKD 3B, DM 2, diabetic neuropathy, hypertension morbid obesity, untreated sleep apnea, chronic hypoxemia (on baseline 3 to 4 L nasal cannula at nursing home), diastolic dysfunction, colon cancer s/p resection (in remission), chronic immobility who presents to [facility] with worsening shortness of breath.  Associated with orthopnea.  Presents from nursing home.  Per H&P, patient and husband had reported that she is more bloated and has also been experiencing shortness of breath at rest.  Apparently, she received an injection of Lasix  at the nursing home without much success.  Patient is mostly bedbound. In the ER, proBNP was a 853 which seems to be around her baseline.  She did have an echo done which showed an EF of 60 to 65%, normal diastolic parameters.  Chest x-ray showed increased patchy and streaky bibasilar lung opacities (atelectasis versus infection) and stable generalized increased pulmonary interstitium which may be were likely related to chronic lung disease. She was started on Lasix  80mg  IV BID. Patient seen and examined bedside. Patient reports that she had botox  for her bladder done recently and has been having to push quite a bit to pee. She does report that her breathing is a bit better as compared to yesterday which she is grateful for. She denies any recent N/V/D, diminished PO intake since her symptoms of SOB started about a couple of weeks ago. Denies any dysuria, chest pain.   Medications:  Current Facility-Administered Medications  Medication Dose Route Frequency Provider Last Rate Last Admin   (feeding supplement) PROSource Plus liquid 30 mL  30 mL Oral Daily Ghimire, Kuber, MD       acetaminophen  (TYLENOL ) tablet 650 mg  650 mg Oral Q6H PRN Ghimire, Kuber, MD       Or   acetaminophen  (TYLENOL ) suppository 650 mg  650 mg Rectal Q6H PRN Raenelle Coria, MD  albuterol  (PROVENTIL ) (2.5 MG/3ML) 0.083% nebulizer solution 2.5 mg  2.5 mg  Nebulization Q2H PRN Ghimire, Kuber, MD   2.5 mg at 10/14/24 1020   alum & mag hydroxide-simeth (MAALOX/MYLANTA) 200-200-20 MG/5ML suspension 30 mL  30 mL Oral Q2H PRN Raenelle Coria, MD       amLODipine  (NORVASC ) tablet 10 mg  10 mg Oral Daily Ghimire, Kuber, MD   10 mg at 10/14/24 0948   ascorbic acid  (VITAMIN C ) tablet 500 mg  500 mg Oral BID Ghimire, Kuber, MD   500 mg at 10/14/24 2144   aspirin  EC tablet 81 mg  81 mg Oral Daily Ghimire, Kuber, MD   81 mg at 10/14/24 0947   atorvastatin  (LIPITOR) tablet 10 mg  10 mg Oral QHS Ghimire, Kuber, MD   10 mg at 10/14/24 2143   cholecalciferol (VITAMIN D3) 25 MCG (1000 UNIT) tablet 2,000 Units  2,000 Units Oral Daily Raenelle Coria, MD   2,000 Units at 10/14/24 0948   docusate sodium  (COLACE) capsule 100 mg  100 mg Oral Daily Ghimire, Kuber, MD   100 mg at 10/14/24 0948   estradiol  (ESTRACE ) vaginal cream 1 Applicatorful  1 Applicatorful Vaginal Daily Raenelle Coria, MD   1 Applicatorful at 10/14/24 1045   fluticasone furoate-vilanterol (BREO ELLIPTA ) 100-25 MCG/ACT 1 puff  1 puff Inhalation Daily Raenelle Coria, MD   1 puff at 10/14/24 1020   furosemide  (LASIX ) injection 80 mg  80 mg Intravenous BID Ghimire, Kuber, MD   80 mg at 10/14/24 1526   gabapentin  (NEURONTIN ) capsule 300 mg  300 mg Oral BID Ghimire, Kuber, MD   300 mg at 10/14/24 2143   heparin  injection 5,000 Units  5,000 Units Subcutaneous Q8H Raenelle Coria, MD   5,000 Units at 10/15/24 0535   insulin  aspart (novoLOG ) injection 0-15 Units  0-15 Units Subcutaneous TID WC Ghimire, Kuber, MD   11 Units at 10/14/24 1637   insulin  aspart (novoLOG ) injection 0-5 Units  0-5 Units Subcutaneous QHS Ghimire, Kuber, MD   4 Units at 10/14/24 2144   insulin  glargine-yfgn (SEMGLEE ) injection 50 Units  50 Units Subcutaneous Daily Ghimire, Kuber, MD   50 Units at 10/14/24 1045   levothyroxine  (SYNTHROID ) tablet 88 mcg  88 mcg Oral QAC breakfast Raenelle Coria, MD   88 mcg at 10/15/24 0533   multivitamin  with minerals tablet 1 tablet  1 tablet Oral Daily Raenelle Coria, MD   1 tablet at 10/14/24 0947   ondansetron  (ZOFRAN ) tablet 4 mg  4 mg Oral Q6H PRN Raenelle Coria, MD       Or   ondansetron  (ZOFRAN ) injection 4 mg  4 mg Intravenous Q6H PRN Raenelle Coria, MD       pantoprazole  (PROTONIX ) EC tablet 40 mg  40 mg Oral Daily Ghimire, Kuber, MD   40 mg at 10/14/24 9051   potassium chloride  SA (KLOR-CON  M) CR tablet 40 mEq  40 mEq Oral Daily Ghimire, Kuber, MD   40 mEq at 10/14/24 9051   saccharomyces boulardii (FLORASTOR) capsule 250 mg  250 mg Oral BID Ghimire, Kuber, MD   250 mg at 10/14/24 2145   sertraline  (ZOLOFT ) tablet 100 mg  100 mg Oral Daily Ghimire, Kuber, MD   100 mg at 10/14/24 0948     ALLERGIES Carvedilol , Benicar [olmesartan], Codeine, Sulfa antibiotics, Trulicity [dulaglutide], and Ceftriaxone   MEDICAL HISTORY Past Medical History:  Diagnosis Date   Anemia    Arthritis    Asthma    Cancer of sigmoid (HCC)  COPD (chronic obstructive pulmonary disease) (HCC)    Depression    Diabetes mellitus    Diastolic dysfunction 05/15/2015   Dyspnea    Generalized weakness    Hyperlipidemia    Hypertension    Hypothyroidism    Obesity    Palpitations    Pneumonia    PONV (postoperative nausea and vomiting)    Prolonged QT interval 05/14/2015   Sleep apnea      SOCIAL HISTORY Social History   Socioeconomic History   Marital status: Married    Spouse name: Not on file   Number of children: Not on file   Years of education: Not on file   Highest education level: Not on file  Occupational History   Not on file  Tobacco Use   Smoking status: Never    Passive exposure: Yes   Smokeless tobacco: Never  Vaping Use   Vaping status: Never Used  Substance and Sexual Activity   Alcohol use: No    Alcohol/week: 0.0 standard drinks of alcohol   Drug use: No   Sexual activity: Yes    Birth control/protection: None  Other Topics Concern   Not on file  Social History  Narrative   Lives with husband.  One living son.  Daughter died after transplant age 63.     Are you right handed or left handed? Right   Are you currently employed ? 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    What type of home do you live in: 1 story or 2 story? one       Social Drivers of Corporate Investment Banker Strain: Medium Risk (08/14/2021)   Received from [facility]   Overall Financial Resource Strain (CARDIA)    Difficulty of Paying Living Expenses: Somewhat hard  Food Insecurity: No Food Insecurity (10/14/2024)   Hunger Vital Sign    Worried About Running Out of Food in the Last Year: Never true    Ran Out of Food in the Last Year: Never true  Transportation Needs: No Transportation Needs (10/14/2024)   PRAPARE - Administrator, Civil Service (Medical): No    Lack of Transportation (Non-Medical): No  Physical Activity: Inactive (08/14/2021)   Received from [facility]   Exercise Vital Sign    On average, how many days per week do you engage in moderate to strenuous exercise (like a brisk walk)?: 0 days    On average, how many minutes do you engage in exercise at this level?: 0 min  Stress: Stress Concern Present (08/14/2021)   Received from [facility] - Occupational Stress Questionnaire    Feeling of Stress : Very much  Social Connections: Unknown (04/08/2022)   Received from [facility]   Social Network    Social Network: Not on file  Intimate Partner Violence: Not At Risk (10/14/2024)   Humiliation, Afraid, Rape, and Kick questionnaire    Fear of Current or Ex-Partner: No    Emotionally Abused: No    Physically Abused: No    Sexually Abused: No     FAMILY HISTORY Family History  Problem Relation Age of Onset   Stroke Mother    Diabetes Father    Heart failure Father    Hypertension Father    Diabetes Sister    Heart failure Sister     Hypertension Sister    Stroke Sister  Cancer Other    Stroke Sister      Review of Systems: 12 systems reviewed Otherwise as per HPI, all other systems reviewed and negative  Physical Exam: Vitals:   10/15/24 0154 10/15/24 0337  BP: (!) 156/68 (!) 144/64  Pulse: 79 77  Resp: 18 16  Temp: 99.2 F (37.3 C) 99 F (37.2 C)  SpO2: 95% 97%   No intake/output data recorded.  Intake/Output Summary (Last 24 hours) at 10/15/2024 9261 Last data filed at 10/14/2024 2217 Gross per 24 hour  Intake 120 ml  Output --  Net 120 ml   General: well-appearing, no acute distress, sitting up in bed eating breakfast HEENT: anicteric sclera, oropharynx clear without lesions CV: regular rate, normal rhythm, no murmurs, no gallops, no rubs Lungs: poor air entry diffusely, unlabored, normal wob, no w/r/r/c appreciated Abd: soft, non-tender, non-distended, obese, +abd wall edema Skin: no visible lesions or rashes Psych: alert, engaged, appropriate mood and affect Musculoskeletal: tense 1+ pitting edema b/l LEs Neuro: normal speech, no gross focal deficits   [name] Results Reviewed Lab Results  Component Value Date   NA 142 10/15/2024   K 4.5 10/15/2024   CL 99 10/15/2024   CO2 37 (H) 10/15/2024   BUN 40 (H) 10/15/2024   CREATININE 2.51 (H) 10/15/2024   CALCIUM  9.7 10/15/2024   ALBUMIN 3.7 10/14/2024   PHOS 2.5 07/02/2023     I have reviewed all relevant outside healthcare records related to the patient's kidney injury.

## 2024-10-15 NOTE — Progress Notes (Addendum)
 Heart Failure Nurse Navigator Progress Note  PCP: Patient, No Pcp Per PCP-Cardiologist: New Consult with  Dorn Lesches, MD on 10/23/2024 Admission Diagnosis: COPD exacerbation (HCC) Acute on chronic congestive heart failure, unspecified heart failure type [facility]) Admitted from: SNF-[facility] Patient: Called remotely to her room 305 at [facility].  2 Patient identifiers confirmed prior to telephone conversation.  RN at the bedside to provide her with the CHF education booklet.   Presentation:   [name] is a 63 y.o. female.  She presented with increasing shortness of breath over the past 3 days.Patient lives at [facility].   Symptoms worsened so EMS was called.  Her SpO2 was 82% on her baseline 3L. She did have chest pain 2 days prior. She has a history of HTN, COPD, DM, OSA, HLD, anemia, colon cancer, IBS, neuropathy, depression.  BNP 853.  HS-Troponin 78.  Chest x-ray:Indeterminate for atelectasis vs infection.  Stable generalized increased pulmonary interstitium, might be chronic lung disease rather than pulmonary edema.   ECHO/ LVEF: 60-65%   Clinical Course:  Past Medical History:  Diagnosis Date   Anemia    Arthritis    Asthma    Cancer of sigmoid (HCC)    COPD (chronic obstructive pulmonary disease) (HCC)    Depression    Diabetes mellitus    Diastolic dysfunction 05/15/2015   Dyspnea    Generalized weakness    Hyperlipidemia    Hypertension    Hypothyroidism    Obesity    Palpitations    Pneumonia    PONV (postoperative nausea and vomiting)    Prolonged QT interval 05/14/2015   Sleep apnea      Social History   Socioeconomic History   Marital status: Married    Spouse name: Not on file   Number of children: Not on file   Years of education: Not on file   Highest education level: Not on file  Occupational History   Not on file  Tobacco Use   Smoking status: Never    Passive exposure: Yes   Smokeless tobacco: Never  Vaping  Use   Vaping status: Never Used  Substance and Sexual Activity   Alcohol use: No    Alcohol/week: 0.0 standard drinks of alcohol   Drug use: No   Sexual activity: Yes    Birth control/protection: None  Other Topics Concern   Not on file  Social History Narrative   Lives with husband.  One living son.  Daughter died after transplant age 54.     Are you right handed or left handed? Right   Are you currently employed ? 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    What type of home do you live in: 1 story or 2 story? one       Social Drivers of Corporate Investment Banker Strain: Medium Risk (08/14/2021)   Received from [facility]   Overall Financial Resource Strain (CARDIA)    Difficulty of Paying Living Expenses: Somewhat hard  Food Insecurity: No Food Insecurity (10/14/2024)   Hunger Vital Sign    Worried About Running Out of Food in the Last Year: Never true    Ran Out of Food in the Last Year: Never true  Transportation Needs: No Transportation Needs (10/14/2024)   PRAPARE - Administrator, Civil Service (Medical): No    Lack of Transportation (Non-Medical): No  Physical Activity: Inactive (08/14/2021)   Received from [facility]   Exercise Vital Sign    On average, how many days per week do you engage in moderate to strenuous exercise (like a brisk walk)?: 0 days    On average, how many minutes do you engage in exercise at this level?: 0 min  Stress: Stress Concern Present (08/14/2021)   Received from [facility] - Occupational Stress Questionnaire    Feeling of Stress : Very much  Social Connections: Unknown (04/08/2022)   Received from [facility]   Social Network    Social Network: Not on file   Education Assessment and Provision:  Detailed education and instructions provided on heart failure disease management including the following:  Signs and symptoms of  Heart Failure When to call the physician Importance of daily weights Low sodium diet Fluid restriction Medication management Anticipated future follow-up appointments  Patient education given on each of the above topics.  Patient acknowledges understanding via teach back method and acceptance of all instructions.  Education Materials:  Living Better With Heart Failure Booklet, HF zone tool, & Daily Weight Tracker Tool.  Patient has scale at home: [facility] for 2 years Patient has pill box at home: SNF provides her with medications    High Risk Criteria for Readmission and/or Poor Patient Outcomes: Heart failure hospital admissions (last 6 months): 2  No Show rate: 8% Difficult social situation: Lives @ [facility]. Demonstrates medication adherence: SNF provides them. Primary Language: English Literacy level: Reading, Writing & Comprehension  Barriers of Care:   Daily Weights @ SNF Diet & Fluid Restrictions @ SNF  Considerations/Referrals:  Referral made to Heart Failure Pharmacist Stewardship: N/A Referral made to Heart Failure CSW/NCM TOC: No Referral made to Heart & Vascular TOC clinic: No.  Patient has new consult with [facility] already scheduled with Dr. Dorn Lesches on 10/23/2024 @ 1:45 PM.  Items for Follow-up on DC/TOC: Daily Weights Diet & Fluid Restrictions Continued Heart Failure Education  Charmaine Pines, RN, BSN [facility] Heart Failure Navigator Secure Chat Only

## 2024-10-16 DIAGNOSIS — G4733 Obstructive sleep apnea (adult) (pediatric): Secondary | ICD-10-CM | POA: Diagnosis not present

## 2024-10-16 DIAGNOSIS — J449 Chronic obstructive pulmonary disease, unspecified: Secondary | ICD-10-CM | POA: Diagnosis not present

## 2024-10-16 DIAGNOSIS — N179 Acute kidney failure, unspecified: Secondary | ICD-10-CM

## 2024-10-16 DIAGNOSIS — I5033 Acute on chronic diastolic (congestive) heart failure: Secondary | ICD-10-CM | POA: Diagnosis not present

## 2024-10-16 DIAGNOSIS — I509 Heart failure, unspecified: Secondary | ICD-10-CM

## 2024-10-16 LAB — BASIC METABOLIC PANEL WITH GFR
Anion gap: 7 (ref 5–15)
BUN: 43 mg/dL — ABNORMAL HIGH (ref 8–23)
CO2: 40 mmol/L — ABNORMAL HIGH (ref 22–32)
Calcium: 9.6 mg/dL (ref 8.9–10.3)
Chloride: 98 mmol/L (ref 98–111)
Creatinine, Ser: 2.16 mg/dL — ABNORMAL HIGH (ref 0.44–1.00)
GFR, Estimated: 25 mL/min — ABNORMAL LOW (ref >60)
Glucose, Bld: 175 mg/dL — ABNORMAL HIGH (ref 70–99)
Potassium: 4.4 mmol/L (ref 3.5–5.1)
Sodium: 144 mmol/L (ref 135–145)

## 2024-10-16 LAB — GLUCOSE, CAPILLARY
Glucose-Capillary: 155 mg/dL — ABNORMAL HIGH (ref 70–99)
Glucose-Capillary: 212 mg/dL — ABNORMAL HIGH (ref 70–99)
Glucose-Capillary: 283 mg/dL — ABNORMAL HIGH (ref 70–99)
Glucose-Capillary: 243 mg/dL — ABNORMAL HIGH (ref 70–99)

## 2024-10-16 NOTE — Progress Notes (Signed)
 Daily Progress Note   Patient Name: [name]       Date: 10/16/2024 DOB: [birth year]  Age: 63 y.o. MRN#: [id number] Attending Physician: Ricky Fines, MD Primary Care Physician: Patient, No Pcp Per Admit Date: 10/14/2024  Reason for Consultation/Follow-up: Establishing goals of care  Patient Profile/HPI:   63 y.o. female  with past medical history of morbid obesity, chronic respiratory failure on home oxygen , CKDIV, DM2, colon ca s/p resection with no recurrence, admitted on 10/14/2024 with CHF exacerbation with volume overload. She has also developed AKI- likely cardiorenal. Per nephrology not a good dialysis candidate. Palliative medicine consulted for GOC.    Subjective: Chart reviewed including labs, progress notes, imaging from this and previous encounters.  Cr improved today 2.16. Per nephrology note- plan to continue IV lasix .  Patient sleeping- did not arouse to my voice.   ROS   Physical Exam Vitals and nursing note reviewed.  Pulmonary:     Effort: Pulmonary effort is normal.             Vital Signs: BP (!) 161/77 (BP Location: Right Wrist)   Pulse 73   Temp 98.2 F (36.8 C) (Oral)   Resp 17   Ht 5' 1 (1.549 m)   Wt (!) 136.5 kg   LMP 08/06/2012   SpO2 100%   BMI 56.86 kg/m  SpO2: SpO2: 100 % O2 Device: O2 Device: Nasal Cannula O2 Flow Rate: O2 Flow Rate (L/min): 4 L/min  Intake/output summary:  Intake/Output Summary (Last 24 hours) at 10/16/2024 1223 Last data filed at 10/16/2024 9473 Gross per 24 hour  Intake 240 ml  Output 2450 ml  Net -2210 ml   LBM: Last BM Date : 10/13/24 Baseline Weight: Weight: (!) 141.6 kg Most recent weight: Weight: (!) 136.5 kg       Palliative Assessment/Data: PPS: 30%      Patient Active Problem List    Diagnosis Date Noted   COPD with acute exacerbation (HCC) 10/14/2024   Lobar pneumonia 11/16/2023   Acute on chronic respiratory failure with hypoxia and hypercapnia (HCC) 11/16/2023   Encephalopathy acute 11/12/2023   Acquired renal cyst of right kidney 08/04/2023   Mixed stress and urge incontinence 08/04/2023   Ambulatory dysfunction 08/04/2023   Urinary urgency 08/04/2023   Urge incontinence 08/04/2023   Urinary frequency  08/04/2023   Cognitive communication deficit 08/04/2023   Overactive bladder 08/02/2023   Recurrent UTI 08/02/2023   Left carpal tunnel syndrome 05/27/2023   Right carpal tunnel syndrome 05/27/2023   Polyneuropathy associated with underlying disease 09/20/2022   Sensorineural hearing loss (SNHL) of both ears 09/20/2022   Colon cancer (HCC) 08/11/2022   Cancer of sigmoid colon (HCC) 05/12/2022   Iron deficiency anemia    Pressure injury of skin 11/24/2021   Chronic venous stasis 11/22/2021   Leukocytosis 11/22/2021   Thrombocytosis 11/22/2021   Hyperglycemia due to diabetes mellitus (HCC) 11/22/2021   Lactic acidosis 11/22/2021   Hypoalbuminemia due to protein-calorie malnutrition 11/22/2021   Elevated liver enzymes 11/22/2021   Mixed hyperlipidemia 11/22/2021   GERD (gastroesophageal reflux disease) 11/22/2021   Acute on chronic diastolic CHF (congestive heart failure) (HCC) 11/22/2021   Diabetic foot infection (HCC) 11/22/2021   Uncontrolled type 2 diabetes mellitus with hyperglycemia, with long-term current use of insulin  (HCC) 11/22/2021   Lower extremity cellulitis 11/21/2021   Class 3 severe obesity with serious comorbidity and body mass index (BMI) of 50.0 to 59.9 in adult (HCC) 07/10/2021   Morbid obesity with BMI of 50.0-59.9, adult (HCC) 01/29/2020   OSA (obstructive sleep apnea) 12/21/2019   Asthma 12/21/2019   Current moderate episode of major depressive disorder (HCC) 01/16/2019   Stress incontinence of urine 01/16/2019   Irritable bowel  syndrome with diarrhea 02/21/2018   Uterine prolapse 08/09/2017   Acquired hypothyroidism 09/27/2016   Anxiety, generalized 12/03/2015   Hypertension associated with diabetes (HCC) 08/26/2015   Diastolic dysfunction 05/15/2015   Prolonged QT interval 05/14/2015   Palpitations 07/23/2014   Generalized weakness 07/23/2014   Essential hypertension 07/22/2014   COPD (chronic obstructive pulmonary disease) (HCC) 07/22/2014   Depression 07/22/2014    Palliative Care Assessment & Plan    Assessment/Recommendations/Plan  -AKI in setting of acute CHF- continue current interventions, patient understands that if kidneys do not recover she will be at end of life- she is improving with Cr down and improvement in fluid status -Code status- changed to DNR-limited interventions per discussion with patient and her spouse. Hopeful for stabilization and d/c back to [facility] with outpatient Palliative   Code Status:   Code Status: Limited: Do not attempt resuscitation (DNR) -DNR-LIMITED -Do Not Intubate/DNI    Prognosis:  Unable to determine  Discharge Planning: LTC with Palliative  Thank you for allowing the Palliative Medicine Team to assist in the care of this patient.  Total time:  Prolonged billing:  Time includes:   Preparing to see the patient (e.g., review of tests) Obtaining and/or reviewing separately obtained history Performing a medically necessary appropriate examination and/or evaluation Counseling and educating the patient/family/caregiver Ordering medications, tests, or procedures Referring and communicating with other health care professionals (when not reported separately) Documenting clinical information in the electronic or other health record Independently interpreting results (not reported separately) and communicating results to the patient/family/caregiver Care coordination (not reported separately) Clinical documentation  Cassondra Stain, AGNP-C Palliative  Medicine   Please contact Palliative Medicine Team phone at [phone] for questions and concerns.

## 2024-10-16 NOTE — TOC Progression Note (Signed)
 Transition of Care [facility]) - Progression Note    Patient Details  Name: [name] MRN: [id number] Date of Birth: 1961/11/20  Transition of Care [facility]) CM/SW Contact  Hoy DELENA Bigness, LCSW Phone Number: 10/16/2024, 10:44 AM  Clinical Narrative:    Spoke with pt and spouse and confirmed plan for outpatient palliative care services to follow when pt returns to [facility]. Referral made to [facility] for outpatient palliative care.    Expected Discharge Plan: Skilled Nursing Facility Barriers to Discharge: Continued Medical Work up               Expected Discharge Plan and Services In-house Referral: Clinical Social Work   Post Acute Care Choice: Skilled Nursing Facility Living arrangements for the past 2 months: Skilled Nursing Facility                                       Social Drivers of Health (SDOH) Interventions SDOH Screenings   Food Insecurity: No Food Insecurity (10/14/2024)  Housing: Low Risk  (10/14/2024)  Transportation Needs: No Transportation Needs (10/14/2024)  Utilities: Not At Risk (10/14/2024)  Financial Resource Strain: Medium Risk (08/14/2021)   Received from [facility]  Physical Activity: Inactive (08/14/2021)   Received from [facility]  Social Connections: Unknown (04/08/2022)   Received from [facility]  Stress: Stress Concern Present (08/14/2021)   Received from [facility]  Tobacco Use: Medium Risk (10/14/2024)    Readmission Risk Interventions    10/16/2024   10:44 AM 10/15/2024    1:51 PM 11/14/2023   11:51 AM  Readmission Risk Prevention Plan  Transportation Screening Complete Complete Complete  PCP or Specialist Appt within 3-5 Days Complete    HRI or Home Care Consult Complete Complete Complete  Social Work Consult for Recovery Care Planning/Counseling Complete Complete Complete  Palliative Care Screening Complete -- Not Applicable  Medication Review Oceanographer) Complete Complete Complete

## 2024-10-16 NOTE — Plan of Care (Signed)
   Problem: Education: Goal: Knowledge of General Education information will improve Description Including pain rating scale, medication(s)/side effects and non-pharmacologic comfort measures Outcome: Progressing   Problem: Education: Goal: Knowledge of General Education information will improve Description Including pain rating scale, medication(s)/side effects and non-pharmacologic comfort measures Outcome: Progressing

## 2024-10-16 NOTE — Progress Notes (Signed)
 Progress Note   Patient: [name] FMW:[id number] DOB: 1961-09-24 DOA: 10/14/2024     1 DOS: the patient was seen and examined on 10/16/2024   Brief hospital admission narrative: As per H&P written by Dr. Raenelle on 10/14/2024 [name] is a 63 y.o. female with medical history significant of morbid obesity, immobility and mostly wheelchair-bound, untreated sleep apnea, chronic hypoxemia on 3 to 4 L of oxygen  at nursing home, CKD stage IV, type 2 diabetes with peripheral neuropathy, hypertension, colon cancer s/p resection in remission, diastolic dysfunction brought from nursing home with gradual worsening shortness of breath even at rest and unable to lie flat.  Patient is poor historian.  Nursing home records reviewed.  Husband is at the bedside who lives at home, patient has been living at nursing home for last 2 years, he visits her every day.  According to the patient and husband she has been more bloated these days, she has difficulty laying flat and gets short of breath even at rest.  Does have some wheezing but no cough.  Denies any fever chills or recent URI symptoms.  According to the patient's husband, they did try some injection of Lasix  and at the nursing home without much success. At baseline, patient is on wheelchair and mostly bedbound.  Hardly can walk from bed to the wheelchair few steps. ED Course: Sleepy, blood pressure stable, on 3 L oxygen .  Creatinine 2.43 which is at about baseline.  Chest x-ray with poor quality however,, it does show chronic atelectatic changes.  Previous echocardiogram with normal ejection fraction. PVC with CO2 of 72, pH 7.36.  Compensated.  Patient could not tolerate CPAP in the past. proBNP 853 that is at about her historic levels. Patient was given 1 dose of nebulizer and 40 mg of Lasix  in the ER and admission requested due to significant symptoms.  Assessment and plan 1-shortness of breath -Multifactorial in the setting of obesity,  hypoventilation syndrome, obstructive sleep apnea, underlying history of COPD and acute on chronic diastolic heart failure - Acute saturation on chronic supplementation - Patient reports feeling better - Continue current dose of IV Lasix . - Low-sodium diet discussed with patient - Follow nephrology service recommendation - Continue to follow daily weights, strict I's and O's and low-sodium diet.  2-stage IV renal failure - Making more difficult ongoing diuresis process - In the past has been found not the best candidate for dialysis - Appreciate assistance and recommendation by nephrology service. - Continue current IV diuresis - Minimize nephrotoxic agents and avoid hypotension and the use of contrast. - Renal function overall with stabilizing) most likely associated with decreased perfusion in the setting of CHF exacerbation).  3-morbid obesity -Body mass index is 56.86 kg/m.  -Low-calorie diet and portion control discussed with patient.  4-type 2 diabetes mellitus with nephropathy and neuropathy - Continue to follow CBG fluctuation - Sliding scale insulin  and long-acting insulin  in place. - Continue gabapentin .  5-depression - Continue sertraline  - No suicidal ideation or hallucination.  6-hypothyroidism - Continue Synthroid . -TSH WNL.  7-social ethics - Appreciate assistance and recommendation by palliative care service - Patient is now DNR/DNI - Planning to pursued palliative care follow-up as an outpatient on her return to [facility].   Subjective:  Overall feeling better and noticing improvement in her urine output.  Still short winded with activity and expressing orthopnea.  No chest pain, no nausea, no vomiting.  Physical Exam: Vitals:   10/16/24 0458 10/16/24 0851 10/16/24 0930 10/16/24  1307  BP:   (!) 161/77 (!) 165/66  Pulse:   73 84  Resp:   17   Temp:   98.2 F (36.8 C) 99 F (37.2 C)  TempSrc:   Oral Oral  SpO2:  (!) 83% 100% 93%  Weight: (!)  136.5 kg     Height:       General exam: Alert, awake, oriented x 3; reports feeling better and expressed increased urine output.  No nausea, no vomiting. Respiratory system: Short winded with activity and expressing some orthopnea; decreased breath sounds appreciated on exam.  No wheezing.  Positive scattered rhonchi.  No using accessory muscles.  Good saturation on chronic supplementation. Cardiovascular system: No rubs, no gallops, unable to assess JVD due to body habitus. Gastrointestinal system: Abdomen is obese, nondistended, soft and nontender. No organomegaly or masses felt. Normal bowel sounds heard. Central nervous system: No focal neurological deficits. Extremities: No cyanosis or clubbing; 2+ edema appreciated bilaterally. Skin: No petechiae. Psychiatry: Judgement and insight appear normal.  Flat affect appreciated on exam.  Data Reviewed: Basic metabolic panel: Sodium 144, potassium 4.4, chloride 98, bicarb 40, BUN 43, creatinine 2.16 and GFR 25  Family Communication: No family at bedside.  Disposition: Status is: Inpatient Remains inpatient appropriate because: Continue IV diuresis.  Time spent: 50 minutes  Author: Eric Nunnery, MD 10/16/2024 1:22 PM  For on call review [email or web address].

## 2024-10-16 NOTE — Progress Notes (Addendum)
 Nurse at bedside,patient alert,and oriented to times four.Patient was very sleepy when  I arrived in room this morning,breakfast had not been touched. However,after some time,patient became fully awake,was able to take medications with no issues,assisted patient with getting her breakfast table across her to eat.No c/o pain or discomfort this morning.Plan of care on going.

## 2024-10-16 NOTE — Progress Notes (Signed)
 Nephrology Follow-Up Consult note   Assessment/Recommendations: [name] is a/an 63 y.o. female with a past medical history significant for CKD 3B, DM2, HTN, obesity, OSA, HFpEF, chronic immobility, admitted for volume overload.       Nonoliguric AKI on CKD 3B: Baseline creatinine around 1.7.  Likely cardiorenal syndrome.  Creatinine improving today - Continue with diuresis as below -Continue to monitor daily Cr, Dose meds for GFR -Monitor Daily I/Os, Daily weight  -Maintain MAP>65 for optimal renal perfusion.  -Avoid nephrotoxic medications including NSAIDs -Use synthetic opioids (Fentanyl /Dilaudid ) if needed - Like to sign off tomorrow if creatinine continues to improve  Acute heart failure exacerbation: Normal ejection fraction but volume overloaded on exam.  Good response to IV Lasix  at this time.  Continue with IV Lasix  80 mg twice daily and monitor urine output and weights.  Hypertension: Blood pressure fairly elevated.  Likely associated with volume excess.  Continue with diuresis as above.  Continue home amlodipine .  Could consider beta-blocker  Anemia: Mild.  Hemoglobin around 10.  Continue to monitor.  Uncontrolled Diabetes Mellitus Type 2 with Hyperglycemia: Management per primary team  Obesity: Management per primary team  Goals of care: Palliative care consulted.  Appreciate help   Recommendations conveyed to primary service.    [facility] 10/16/2024 8:54 AM  ___________________________________________________________  CC: Shortness of breath and orthopnea  Interval History/Subjective: Patient states shortness of breath is somewhat better.  Lying relatively flat at this time.  Echocardiogram was reassuring.  Creatinine has improved to 2.2.  Good urine output with 3 L recorded in the past 24 hours.   Medications:  Current Facility-Administered Medications  Medication Dose Route Frequency Provider Last Rate Last  Admin   (feeding supplement) PROSource Plus liquid 30 mL  30 mL Oral Daily Raenelle Coria, MD   30 mL at 10/15/24 0854   acetaminophen  (TYLENOL ) tablet 650 mg  650 mg Oral Q6H PRN Raenelle Coria, MD       Or   acetaminophen  (TYLENOL ) suppository 650 mg  650 mg Rectal Q6H PRN Raenelle Coria, MD       albuterol  (PROVENTIL ) (2.5 MG/3ML) 0.083% nebulizer solution 2.5 mg  2.5 mg Nebulization Q2H PRN Ghimire, Kuber, MD   2.5 mg at 10/14/24 1020   alum & mag hydroxide-simeth (MAALOX/MYLANTA) 200-200-20 MG/5ML suspension 30 mL  30 mL Oral Q2H PRN Raenelle Coria, MD       amLODipine  (NORVASC ) tablet 10 mg  10 mg Oral Daily Ghimire, Kuber, MD   10 mg at 10/15/24 0853   ascorbic acid  (VITAMIN C ) tablet 500 mg  500 mg Oral BID Ghimire, Kuber, MD   500 mg at 10/15/24 2110   aspirin  EC tablet 81 mg  81 mg Oral Daily Ghimire, Kuber, MD   81 mg at 10/15/24 0930   atorvastatin  (LIPITOR) tablet 10 mg  10 mg Oral QHS Ghimire, Kuber, MD   10 mg at 10/15/24 2110   cholecalciferol (VITAMIN D3) 25 MCG (1000 UNIT) tablet 2,000 Units  2,000 Units Oral Daily Raenelle Coria, MD   2,000 Units at 10/15/24 0853   docusate sodium  (COLACE) capsule 100 mg  100 mg Oral Daily Ghimire, Kuber, MD   100 mg at 10/15/24 9146   estradiol  (ESTRACE ) vaginal cream 1 Applicatorful  1 Applicatorful Vaginal Daily Raenelle Coria, MD   1 Applicatorful at 10/15/24 2110   fluticasone furoate-vilanterol (BREO ELLIPTA ) 100-25 MCG/ACT 1 puff  1 puff Inhalation Daily Raenelle Coria, MD   1 puff  at 10/16/24 0851   furosemide  (LASIX ) injection 80 mg  80 mg Intravenous BID Ghimire, Kuber, MD   80 mg at 10/15/24 1618   gabapentin  (NEURONTIN ) capsule 300 mg  300 mg Oral BID Ghimire, Kuber, MD   300 mg at 10/15/24 2110   heparin  injection 5,000 Units  5,000 Units Subcutaneous Q8H Ghimire, Kuber, MD   5,000 Units at 10/16/24 0454   insulin  aspart (novoLOG ) injection 0-15 Units  0-15 Units Subcutaneous TID WC Ghimire, Kuber, MD   11 Units at 10/15/24 1656    insulin  aspart (novoLOG ) injection 0-5 Units  0-5 Units Subcutaneous QHS Ghimire, Kuber, MD   4 Units at 10/15/24 2110   insulin  glargine-yfgn (SEMGLEE ) injection 50 Units  50 Units Subcutaneous Daily Ghimire, Kuber, MD   50 Units at 10/15/24 9070   levothyroxine  (SYNTHROID ) tablet 88 mcg  88 mcg Oral QAC breakfast Raenelle Coria, MD   88 mcg at 10/16/24 0454   multivitamin with minerals tablet 1 tablet  1 tablet Oral Daily Ghimire, Kuber, MD   1 tablet at 10/15/24 9146   nystatin  (MYCOSTATIN /NYSTOP ) topical powder   Topical TID Ricky Fines, MD   Given at 10/15/24 2115   ondansetron  (ZOFRAN ) tablet 4 mg  4 mg Oral Q6H PRN Ghimire, Kuber, MD       Or   ondansetron  (ZOFRAN ) injection 4 mg  4 mg Intravenous Q6H PRN Raenelle Coria, MD       pantoprazole  (PROTONIX ) EC tablet 40 mg  40 mg Oral Daily Ghimire, Kuber, MD   40 mg at 10/15/24 9146   potassium chloride  SA (KLOR-CON  M) CR tablet 40 mEq  40 mEq Oral Daily Ghimire, Kuber, MD   40 mEq at 10/15/24 9146   saccharomyces boulardii (FLORASTOR) capsule 250 mg  250 mg Oral BID Ghimire, Kuber, MD   250 mg at 10/15/24 2111   sertraline  (ZOLOFT ) tablet 100 mg  100 mg Oral Daily Raenelle Coria, MD   100 mg at 10/15/24 9146      Review of Systems: 10 systems reviewed and negative except per interval history/subjective  Physical Exam: Vitals:   10/15/24 1956 10/16/24 0456  BP: (!) 153/82 (!) 166/81  Pulse: 80 73  Resp: 20 17  Temp: 98.5 F (36.9 C) 97.6 F (36.4 C)  SpO2: 94% 92%   No intake/output data recorded.  Intake/Output Summary (Last 24 hours) at 10/16/2024 0854 Last data filed at 10/16/2024 9473 Gross per 24 hour  Intake 240 ml  Output 2450 ml  Net -2210 ml   Constitutional: Obese, no acute distress ENMT: ears and nose without scars or lesions, MMM CV: normal rate, 1+ edema of the bilateral lower extremities Respiratory: bilateral chest rise with mild increased work of breathing Gastrointestinal: soft, non-tender, no  palpable masses or hernias Skin: no visible lesions or rashes Psych: alert, judgement/insight appropriate, appropriate mood and affect   Test Results I personally reviewed new and old clinical labs and radiology tests Lab Results  Component Value Date   NA 144 10/16/2024   K 4.4 10/16/2024   CL 98 10/16/2024   CO2 40 (H) 10/16/2024   BUN 43 (H) 10/16/2024   CREATININE 2.16 (H) 10/16/2024   CALCIUM  9.6 10/16/2024   ALBUMIN 3.7 10/14/2024   PHOS 2.5 07/02/2023    CBC Recent Labs  Lab 10/14/24 0425  WBC 9.3  NEUTROABS 7.8*  HGB 10.0*  HCT 32.3*  MCV 88.0  PLT 301

## 2024-10-16 NOTE — Progress Notes (Signed)
 [name] [address] [facility] Liaison Note:  Notified by IP ALVERNA Mays of patient/family request for [facility] Palliative services at [facility] after discharge.  Please call with any hospice or outpatient palliative care related questions.  Thank you for the opportunity to participate in this patient's care.  Eleanor Nail, LPN [facility] Liaison [phone]

## 2024-10-17 ENCOUNTER — Ambulatory Visit: Admitting: Urology

## 2024-10-17 DIAGNOSIS — E66813 Obesity, class 3: Secondary | ICD-10-CM | POA: Diagnosis not present

## 2024-10-17 DIAGNOSIS — I878 Other specified disorders of veins: Secondary | ICD-10-CM

## 2024-10-17 DIAGNOSIS — J9620 Acute and chronic respiratory failure, unspecified whether with hypoxia or hypercapnia: Secondary | ICD-10-CM | POA: Diagnosis not present

## 2024-10-17 DIAGNOSIS — I5033 Acute on chronic diastolic (congestive) heart failure: Secondary | ICD-10-CM | POA: Diagnosis not present

## 2024-10-17 DIAGNOSIS — N3281 Overactive bladder: Secondary | ICD-10-CM

## 2024-10-17 LAB — BASIC METABOLIC PANEL WITH GFR
Anion gap: 6 (ref 5–15)
BUN: 46 mg/dL — ABNORMAL HIGH (ref 8–23)
CO2: 42 mmol/L — ABNORMAL HIGH (ref 22–32)
Calcium: 9.6 mg/dL (ref 8.9–10.3)
Chloride: 96 mmol/L — ABNORMAL LOW (ref 98–111)
Creatinine, Ser: 1.95 mg/dL — ABNORMAL HIGH (ref 0.44–1.00)
GFR, Estimated: 28 mL/min — ABNORMAL LOW (ref >60)
Glucose, Bld: 161 mg/dL — ABNORMAL HIGH (ref 70–99)
Potassium: 4.1 mmol/L (ref 3.5–5.1)
Sodium: 144 mmol/L (ref 135–145)

## 2024-10-17 LAB — GLUCOSE, CAPILLARY
Glucose-Capillary: 198 mg/dL — ABNORMAL HIGH (ref 70–99)
Glucose-Capillary: 261 mg/dL — ABNORMAL HIGH (ref 70–99)

## 2024-10-17 MED ORDER — SERTRALINE HCL 100 MG PO TABS: 100.0000 mg | ORAL_TABLET | Freq: Every day | ORAL | Status: AC

## 2024-10-17 MED ORDER — OXYMETAZOLINE HCL 0.05 % NA SOLN
2.0000 | Freq: Two times a day (BID) | NASAL | Status: DC
  Administered 2024-10-17: 2 via NASAL
  Filled 2024-10-17: qty 30

## 2024-10-17 MED ORDER — TORSEMIDE 20 MG PO TABS: 40.0000 mg | ORAL_TABLET | Freq: Two times a day (BID) | ORAL | Status: DC

## 2024-10-17 MED ORDER — INSULIN GLARGINE-YFGN 100 UNIT/ML ~~LOC~~ SOLN: 50.0000 [IU] | Freq: Every day | SUBCUTANEOUS | Status: DC

## 2024-10-17 MED ORDER — TORSEMIDE 40 MG PO TABS: 40.0000 mg | ORAL_TABLET | Freq: Two times a day (BID) | ORAL | Status: AC

## 2024-10-17 NOTE — Discharge Summary (Signed)
 Physician Discharge Summary   Patient: [name] MRN: [id number] DOB: [birth year]  Admit date:     10/14/2024  Discharge date: 10/17/24  Discharge Physician: Alm Jantzen Pilger   PCP: Patient, No Pcp Per   Recommendations at discharge:   Please follow up with primary care provider within 1-2 weeks  Please repeat BMP and CBC in one week    Hospital Course: 63 year old female  with medical history significant of COPD on 2 to 3 L of oxygen , DM2, hypertension, CKD 3B, chronic diastolic heart failure, morbid obesity BMI of 50, chronic respiratory failure on 3 to 4 L, immobility and mostly wheelchair-bound, peripheral neuropathy, hypertension, colon cancer s/p resection in remission presents from [facility]  with gradual worsening shortness of breath even at rest and unable to lie flat.  Patient is poor historian.  Nursing home records reviewed.  Husband is at the bedside who lives at home, patient has been living at nursing home for last 2 years, he visits her every day.  According to the patient and husband she has been more bloated these days, she has difficulty laying flat and gets short of breath even at rest.  Does have some wheezing but no cough.  Denies any fever chills or recent URI symptoms.  According to the patient's husband, they did try some injection of Lasix  and at the nursing home without much success. At baseline, patient is on wheelchair and mostly bedbound.  Hardly can walk from bed to the wheelchair few steps. ED Course: Sleepy, blood pressure stable, on 3 L oxygen .  Creatinine 2.43 which is at about baseline.  Chest x-ray with poor quality however,, it does show chronic atelectatic changes.  Previous echocardiogram with normal ejection fraction. PVC with CO2 of 72, pH 7.36.  Compensated.  Patient could not tolerate CPAP in the past. proBNP 853 that is at about her historic levels. Patient was given 1 dose of nebulizer and 40 mg of Lasix  in the ER and admission requested due to  significant symptoms. Due to her elevated serum creatinine, nephrology was consulted to assist with management.  The patient was started on IV furosemide  with good clinical effect.  Fortunately, her serum creatinine stabilized and improved. Assessment and Plan: Acute on chronic HFpEF - 10/14/2024 echo EF 60 to 65%, no WMA, normal RVF, trivial MR/TR - Started on IV furosemide  80 mg IV twice daily - Overall improved - I's and O's incomplete - NEG 10 pounds - Discharge weight 290.1 pounds  Acute on chronic renal failure--CKD stage IIIb - Serum creatinine peaked at 2.51 - Based on creatinine 1.6-1.7 - Appreciate nephrology - Serum creatinine 1.96 at time of discharge  Chronic respiratory failure with hypoxia and hypercarbia - Stable on 3-4 L  Essential hypertension - Continue amlodipine  and hydralazine   Morbid obesity - BMI 54.82 - Lifestyle modification  Hypothyroidism - TSH 3.420 - Continue Synthroid   Diabetes mellitus type 2 - 10/14/2024 hemoglobin A1c 7.2 - Started on Semglee  50 units daily - NovoLog  sliding scale  Depression - Continue sertraline   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 Katragadda -surveillance CTs and CEAs- -08/15/23 CEA 1.8  Mixed hyperlipidemia -Continue statin       Consultants: renal Procedures performed: none  Disposition: Skilled nursing facility Diet recommendation:  Cardiac and Carb modified diet DISCHARGE MEDICATION: Allergies as of 10/17/2024       Reactions   Carvedilol  Palpitations   Sulfa Antibiotics Swelling   Whole face swells  Trulicity [dulaglutide] Diarrhea   Benicar [olmesartan] Swelling   Ceftriaxone  Hives, Rash   Codeine Other (See Comments)   Confusion         Medication List     STOP taking these medications    furosemide  10 MG/ML injection Commonly known as: LASIX    furosemide  40 MG tablet Commonly known as: LASIX    Lantus  100  UNIT/ML injection Generic drug: insulin  glargine Replaced by: insulin  glargine-yfgn 100 UNIT/ML injection       TAKE these medications    albuterol  108 (90 Base) MCG/ACT inhaler Commonly known as: VENTOLIN  HFA Inhale 2 puffs into the lungs every 6 (six) hours as needed for wheezing or shortness of breath.   alum & mag hydroxide-simeth 200-200-20 MG/5ML suspension Commonly known as: MAALOX/MYLANTA Take 30 mLs by mouth every 2 (two) hours as needed for indigestion or heartburn. Do not exceed 4 doses in 24 hours   amLODipine  10 MG tablet Commonly known as: NORVASC  Take 10 mg by mouth daily.   ascorbic acid  500 MG tablet Commonly known as: VITAMIN C  Take 500 mg by mouth 2 (two) times daily.   aspirin  EC 81 MG tablet Take 81 mg by mouth daily. Swallow whole.   atorvastatin  10 MG tablet Commonly known as: LIPITOR Take 10 mg by mouth at bedtime.   augmented betamethasone dipropionate 0.05 % cream Commonly known as: DIPROLENE-AF Apply 1 application  topically 2 (two) times daily. Apply to trunk, buttocks, legs for Psoriasis   BD AutoShield Duo 30G X 5 MM Misc Generic drug: Insulin  Pen Needle   Biotin 10 MG Tabs Take 10 mg by mouth daily.   Boost Glucose Control Liqd Take 240 mLs by mouth 2 (two) times daily with a meal.   Calmoseptine 0.44-20.6 % Oint Generic drug: Menthol-Zinc  Oxide Apply 1 Application topically daily as needed (preservation/protection after incontinent care).   carboxymethylcellulose 1 % ophthalmic solution Place 2 drops into both eyes every 6 (six) hours as needed (eye dryness/irritation).   cetirizine 10 MG tablet Commonly known as: ZYRTEC Take 10 mg by mouth daily as needed for allergies.   clotrimazole-betamethasone cream Commonly known as: LOTRISONE Apply 1 Application topically 2 (two) times a week. Apply on Tuesday and Friday and as needed for management of chronic fungal rash after shower   Cranberry 300 MG tablet Take 300 mg by mouth 2  (two) times daily.   docusate sodium  100 MG capsule Commonly known as: COLACE Take 100 mg by mouth daily.   estradiol  0.1 MG/GM vaginal cream Commonly known as: ESTRACE  Discard plastic applicator. Insert a blueberry size amount (approximately 1 gram) of cream on fingertip inside vagina at bedtime every night for 1 week then every other night routinely (for long term use).   Eucerin Advanced Repair Crea Apply 1 Application topically every 12 (twelve) hours as needed (hydration of skin). Apply to bilateral lower extremities   feeding supplement (PRO-STAT 64) Liqd Take 60 mLs by mouth daily.   fluticasone-salmeterol 250-50 MCG/ACT Aepb Commonly known as: ADVAIR Inhale 1 puff into the lungs 2 (two) times daily.   gabapentin  300 MG capsule Commonly known as: NEURONTIN  Take 1 capsule in AM, 1 capsule in PM What changed:  how much to take how to take this when to take this   hydrALAZINE  25 MG tablet Commonly known as: APRESOLINE  Take 1 tablet (25 mg total) by mouth every 8 (eight) hours. What changed: additional instructions   HYDROcodone-acetaminophen  5-325 MG tablet Commonly known as: NORCO/VICODIN Take 1 tablet  by mouth every 6 (six) hours as needed (post op pain).   insulin  glargine-yfgn 100 UNIT/ML injection Commonly known as: SEMGLEE  Inject 0.5 mLs (50 Units total) into the skin daily. Start taking on: October 18, 2024 Replaces: Lantus  100 UNIT/ML injection   ipratropium-albuterol  0.5-2.5 (3) MG/3ML Soln Commonly known as: DUONEB Take 3 mLs by nebulization every 4 (four) hours as needed (shortness of breath/wheezing).   levothyroxine  88 MCG tablet Commonly known as: SYNTHROID  Take 88 mcg by mouth daily before breakfast.   multivitamin with minerals tablet Take 1 tablet by mouth daily.   Myrbetriq  50 MG Tb24 tablet Generic drug: mirabegron  ER Take 1 tablet (50 mg total) by mouth daily.   NovoLOG  FlexPen 100 UNIT/ML FlexPen Generic drug: insulin  aspart Inject  1-6 Units into the skin 3 (three) times daily with meals. Per sliding scale 151-200 = 1 201-250 = 2 251-300 = 3 301-350 = 4 351-400 = 5 >401 = 6 If greater than 400, give 6 units (16 units total) and call Endocrinology clinic What changed: Another medication with the same name was changed. Make sure you understand how and when to take each.   NovoLOG  100 UNIT/ML injection Generic drug: insulin  aspart Inject 14-20 Units into the skin 3 (three) times daily with meals. 90-150= 14 units; 151-200= 15 units; 201-250= 16 units; 251-300= 17 units; 301-350= 18 units; 351-400= 19 units; above 400 = 20 units What changed:  how much to take additional instructions   omeprazole 20 MG capsule Commonly known as: PRILOSEC Take 20 mg by mouth daily.   ondansetron  8 MG tablet Commonly known as: ZOFRAN  Take 8 mg by mouth 2 (two) times daily.   OXYGEN  Inhale 2 L into the lungs continuous.   potassium chloride  SA 20 MEQ tablet Commonly known as: KLOR-CON  M Take 20 mEq by mouth daily.   saccharomyces boulardii 250 MG capsule Commonly known as: FLORASTOR Take 250 mg by mouth 2 (two) times daily.   sertraline  100 MG tablet Commonly known as: ZOLOFT  Take 1 tablet (100 mg total) by mouth daily. Start taking on: October 18, 2024 What changed:  medication strength how much to take   sodium chloride  0.65 % Soln nasal spray Commonly known as: OCEAN Place 1 spray into both nostrils every 2 (two) hours as needed (dry nasal passages).   Torsemide  40 MG Tabs Take 40 mg by mouth 2 (two) times daily. Start taking on: October 18, 2024   trimethoprim  100 MG tablet Commonly known as: TRIMPEX  Take 1 tablet (100 mg total) by mouth daily.        Discharge Exam: Filed Weights   10/15/24 0337 10/16/24 0458 10/17/24 0500  Weight: (!) 141.8 kg (!) 136.5 kg 131.6 kg   HEENT:  [place]/AT, No thrush, no icterus CV:  RRR, no rub, no S3, no S4 Lung:  bibasilar crackles . No wheeze Abd:  soft/+BS,  NT Ext:  No edema, no lymphangitis, no synovitis, no rash   Condition at discharge: stable  The results of significant diagnostics from this hospitalization (including imaging, microbiology, ancillary and laboratory) are listed below for reference.   Imaging Studies: ECHOCARDIOGRAM COMPLETE Result Date: 10/14/2024    ECHOCARDIOGRAM REPORT   Patient Name:   [name] Date of Exam: 10/14/2024 Medical Rec #:  [id number]           Height:       61.0 in Accession #:    [id number]          Weight:  305.0 lb Date of Birth:  28-Dec-1960           BSA:          2.260 m Patient Age:    63 years            BP:           166/65 mmHg Patient Gender: F                   HR:           86 bpm. Exam Location:  [facility] Procedure: 2D Echo, Cardiac Doppler, Color Doppler and Intracardiac            Opacification Agent (Both Spectral and Color Flow Doppler were            utilized during procedure). Indications:    CHF-Acute Diastolic I50.31  History:        Patient has prior history of Echocardiogram examinations, most                 recent 06/27/2023. COPD; Risk Factors:Hypertension, Diabetes and                 Sleep Apnea.  Sonographer:    Jayson Gaskins Referring Phys: [id number] KUBER GHIMIRE IMPRESSIONS  1. Left ventricular ejection fraction, by estimation, is 60 to 65%. The left ventricle has normal function. The left ventricle has no regional wall motion abnormalities. There is mild left ventricular hypertrophy. Left ventricular diastolic parameters were normal.  2. Right ventricular systolic function is normal. The right ventricular size is normal.  3. Trivial mitral valve regurgitation.  4. The aortic valve is tricuspid. Aortic valve regurgitation is not visualized. Aortic valve sclerosis is present, with no evidence of aortic valve stenosis.  5. The inferior vena cava is normal in size with greater than 50% respiratory variability, suggesting right atrial pressure of 3 mmHg. Comparison(s): The left  ventricular function is unchanged. FINDINGS  Left Ventricle: Left ventricular ejection fraction, by estimation, is 60 to 65%. The left ventricle has normal function. The left ventricle has no regional wall motion abnormalities. Definity  contrast agent was given IV to delineate the left ventricular  endocardial borders. The left ventricular internal cavity size was normal in size. There is mild left ventricular hypertrophy. Left ventricular diastolic parameters were normal. Right Ventricle: The right ventricular size is normal. Right vetricular wall thickness was not assessed. Right ventricular systolic function is normal. Left Atrium: Left atrial size was normal in size. Right Atrium: Right atrial size was normal in size. Pericardium: There is no evidence of pericardial effusion. Mitral Valve: There is mild thickening of the mitral valve leaflet(s). Mild to moderate mitral annular calcification. Trivial mitral valve regurgitation. Tricuspid Valve: The tricuspid valve is normal in structure. Tricuspid valve regurgitation is trivial. Aortic Valve: The aortic valve is tricuspid. Aortic valve regurgitation is not visualized. Aortic valve sclerosis is present, with no evidence of aortic valve stenosis. Aortic valve mean gradient measures 4.0 mmHg. Aortic valve peak gradient measures 8.0  mmHg. Aortic valve area, by VTI measures 2.46 cm. Pulmonic Valve: The pulmonic valve was normal in structure. Pulmonic valve regurgitation is not visualized. Aorta: The aortic root is normal in size and structure. Venous: The inferior vena cava is normal in size with greater than 50% respiratory variability, suggesting right atrial pressure of 3 mmHg. IAS/Shunts: No atrial level shunt detected by color flow Doppler.  LEFT VENTRICLE PLAX 2D LVIDd:  4.80 cm   Diastology LVIDs:         3.10 cm   LV e' lateral:   8.92 cm/s LV PW:         1.20 cm   LV E/e' lateral: 13.1 LV IVS:        1.00 cm LVOT diam:     1.80 cm LV SV:         79  LV SV Index:   35 LVOT Area:     2.54 cm  AORTIC VALVE AV Area (Vmax):    2.36 cm AV Area (Vmean):   2.58 cm AV Area (VTI):     2.46 cm AV Vmax:           141.00 cm/s AV Vmean:          98.500 cm/s AV VTI:            0.321 m AV Peak Grad:      8.0 mmHg AV Mean Grad:      4.0 mmHg LVOT Vmax:         131.00 cm/s LVOT Vmean:        100.000 cm/s LVOT VTI:          0.310 m LVOT/AV VTI ratio: 0.97 MITRAL VALVE MV Area (PHT): 3.91 cm     SHUNTS MV Decel Time: 194 msec     Systemic VTI:  0.31 m MV E velocity: 117.00 cm/s  Systemic Diam: 1.80 cm MV A velocity: 127.00 cm/s MV E/A ratio:  0.92 Vina Gull MD Electronically signed by Vina Gull MD Signature Date/Time: 10/14/2024/2:53:09 PM    Final    DG Chest Port 1 View Result Date: 10/14/2024 EXAM: 1 VIEW(S) XRAY OF THE CHEST 10/14/2024 04:38:55 AM COMPARISON: 09/07/2024 CLINICAL HISTORY: Shortness of breath FINDINGS: LUNGS AND PLEURA: Generalized increased pulmonary interstitial markings have not significantly changed from last month. Stable lung volumes. Patchy and streaky increased bibasilar lung opacity, indeterminate for atelectasis versus infection. No pleural effusion. No pneumothorax. No pulmonary edema. HEART AND MEDIASTINUM: Stable cardiomegaly. BONES AND SOFT TISSUES: No acute osseous abnormality. IMPRESSION: 1. Patchy and streaky increased bibasilar lung opacity since last month, indeterminate for atelectasis versus infection. 2. Stable generalized increased pulmonary interstitium; might be chronic lung disease rather than pulmonary edema. Electronically signed by: Helayne Hurst MD 10/14/2024 05:15 AM EST RP Workstation: [id number]    Microbiology: Results for orders placed or performed during the hospital encounter of 10/14/24  Resp panel by RT-PCR (RSV, Flu A&B, Covid) Anterior Nasal Swab     Status: None   Collection Time: 10/14/24  4:16 AM   Specimen: Anterior Nasal Swab  Result Value Ref Range Status   SARS Coronavirus 2 by RT PCR NEGATIVE  NEGATIVE Final    Comment: (NOTE) SARS-CoV-2 target nucleic acids are NOT DETECTED.  The SARS-CoV-2 RNA is generally detectable in upper respiratory specimens during the acute phase of infection. The lowest concentration of SARS-CoV-2 viral copies this assay can detect is 138 copies/mL. A negative result does not preclude SARS-Cov-2 infection and should not be used as the sole basis for treatment or other patient management decisions. A negative result may occur with  improper specimen collection/handling, submission of specimen other than nasopharyngeal swab, presence of viral mutation(s) within the areas targeted by this assay, and inadequate number of viral copies(<138 copies/mL). A negative result must be combined with clinical observations, patient history, and epidemiological information. The expected result is Negative.  Fact Sheet for Patients:  bloggercourse.com  Fact Sheet for Healthcare Providers:  seriousbroker.it  This test is no t yet approved or cleared by the [place]  FDA and  has been authorized for detection and/or diagnosis of SARS-CoV-2 by FDA under an Emergency Use Authorization (EUA). This EUA will remain  in effect (meaning this test can be used) for the duration of the COVID-19 declaration under Section 564(b)(1) of the Act, 21 U.S.C.section 360bbb-3(b)(1), unless the authorization is terminated  or revoked sooner.       Influenza A by PCR NEGATIVE NEGATIVE Final   Influenza B by PCR NEGATIVE NEGATIVE Final    Comment: (NOTE) The Xpert Xpress SARS-CoV-2/FLU/RSV plus assay is intended as an aid in the diagnosis of influenza from Nasopharyngeal swab specimens and should not be used as a sole basis for treatment. Nasal washings and aspirates are unacceptable for Xpert Xpress SARS-CoV-2/FLU/RSV testing.  Fact Sheet for Patients: bloggercourse.com  Fact Sheet for Healthcare  Providers: seriousbroker.it  This test is not yet approved or cleared by the [place]  FDA and has been authorized for detection and/or diagnosis of SARS-CoV-2 by FDA under an Emergency Use Authorization (EUA). This EUA will remain in effect (meaning this test can be used) for the duration of the COVID-19 declaration under Section 564(b)(1) of the Act, 21 U.S.C. section 360bbb-3(b)(1), unless the authorization is terminated or revoked.     Resp Syncytial Virus by PCR NEGATIVE NEGATIVE Final    Comment: (NOTE) Fact Sheet for Patients: bloggercourse.com  Fact Sheet for Healthcare Providers: seriousbroker.it  This test is not yet approved or cleared by the [place]  FDA and has been authorized for detection and/or diagnosis of SARS-CoV-2 by FDA under an Emergency Use Authorization (EUA). This EUA will remain in effect (meaning this test can be used) for the duration of the COVID-19 declaration under Section 564(b)(1) of the Act, 21 U.S.C. section 360bbb-3(b)(1), unless the authorization is terminated or revoked.  Performed at [facility], [address], [place], [place] [address]     Labs: CBC: Recent Labs  Lab 10/14/24 0425  WBC 9.3  NEUTROABS 7.8*  HGB 10.0*  HCT 32.3*  MCV 88.0  PLT 301   Basic Metabolic Panel: Recent Labs  Lab 10/14/24 0425 10/15/24 0410 10/16/24 0412 10/17/24 0354  NA 143 142 144 144  K 4.1 4.5 4.4 4.1  CL 100 99 98 96*  CO2 37* 37* 40* 42*  GLUCOSE 107* 212* 175* 161*  BUN 36* 40* 43* 46*  CREATININE 2.43* 2.51* 2.16* 1.95*  CALCIUM  9.6 9.7 9.6 9.6  MG 2.5*  --   --   --    Liver Function Tests: Recent Labs  Lab 10/14/24 0425  AST 15  ALT 11  ALKPHOS 101  BILITOT 0.4  PROT 6.9  ALBUMIN 3.7   CBG: Recent Labs  Lab 10/16/24 0736 10/16/24 1117 10/16/24 1609 10/16/24 2116 10/17/24 0732  GLUCAP 155* 212* 283* 243* 198*    Discharge time  spent: greater than 30 minutes.  Signed: Alm Schneider, MD [facility] 10/17/2024

## 2024-10-17 NOTE — TOC Transition Note (Signed)
 Transition of Care [facility]) - Discharge Note   Patient Details  Name: [name] MRN: [id number] Date of Birth: 1961/02/13  [facility] CM/SW Contact:  Mcarthur Saddie Kim, LCSW Phone Number: 10/17/2024, 12:06 PM   Clinical Narrative: Pt d/c today back to [facility]. Pt and facility aware and agreeable. Attempted to reach pt's husband but no voicemail set up. D/C summary sent to SNF. RN given number to call report. Will transport via [employer].       Final next level of care: Skilled Nursing Facility Barriers to Discharge: Barriers Resolved   Patient Goals and CMS Choice Patient states their goals for this hospitalization and ongoing recovery are:: return to LTC   Choice offered to / list presented to : [name] ownership interest in [facility].provided to::  (n/a)    Discharge Placement                Patient to be transferred to facility by: [facility] Name of family member notified: Attempted to reach husband- no VM set up Patient and family notified of of transfer: 10/17/24  Discharge Plan and Services Additional resources added to the After Visit Summary for   In-house Referral: Clinical Social Work   Post Acute Care Choice: Skilled Nursing Facility                               Social Drivers of Health (SDOH) Interventions SDOH Screenings   Food Insecurity: No Food Insecurity (10/14/2024)  Housing: Low Risk  (10/14/2024)  Transportation Needs: No Transportation Needs (10/14/2024)  Utilities: Not At Risk (10/14/2024)  Financial Resource Strain: Medium Risk (08/14/2021)   Received from [facility]  Physical Activity: Inactive (08/14/2021)   Received from [facility]  Social Connections: Unknown (04/08/2022)   Received from [facility]  Stress: Stress Concern Present (08/14/2021)   Received from [facility]  Tobacco Use: Medium Risk (10/14/2024)     Readmission Risk Interventions    10/16/2024    10:44 AM 10/15/2024    1:51 PM 11/14/2023   11:51 AM  Readmission Risk Prevention Plan  Transportation Screening Complete Complete Complete  PCP or Specialist Appt within 3-5 Days Complete    HRI or Home Care Consult Complete Complete Complete  Social Work Consult for Recovery Care Planning/Counseling Complete Complete Complete  Palliative Care Screening Complete -- Not Applicable  Medication Review Oceanographer) Complete Complete Complete

## 2024-10-17 NOTE — Care Management Important Message (Signed)
 Important Message  Patient Details  Name: [name] MRN: [id number] Date of Birth: [birth year]   Important Message Given:  N/A - LOS <3 / Initial given by admissions     Duwaine LITTIE Ada 10/17/2024, 12:01 PM

## 2024-10-17 NOTE — Progress Notes (Signed)
 Pt continued to experience cough and nose bleed. Nose bleed is now producing small to medium sized blood clots. V/S taken, within normal range. Charge nurse and MD notified. New orders placed for Afrin nasal spray ordered. Pt tolerated well. Plan on care on going.

## 2024-10-17 NOTE — Progress Notes (Signed)
 Nephrology Follow-Up Consult note   Assessment/Recommendations: [name] is a/an 63 y.o. female with a past medical history significant for CKD 3B, DM2, HTN, obesity, OSA, HFpEF, chronic immobility, admitted for volume overload.       Nonoliguric AKI on CKD 3B: Baseline creatinine around 1.7.  Likely cardiorenal syndrome.  Creatinine improving today - Continue with diuresis as below -Continue to monitor daily Cr, Dose meds for GFR -Monitor Daily I/Os, Daily weight  -Maintain MAP>65 for optimal renal perfusion.  -Avoid nephrotoxic medications including NSAIDs -Use synthetic opioids (Fentanyl /Dilaudid ) if needed -Given improvement we will sign off  Acute heart failure exacerbation: Normal ejection fraction but volume overloaded on exam.  Good response to IV Lasix  at this time.  Can stop IV lasix  and switch to torsemide  40mg  daily at DC. Should follow up closely with PCP after DC and consider nephrology f/u.  Hypertension: Blood pressure fairly elevated.  Likely associated with volume excess.  Continue with diuresis as above.  Continue home amlodipine .  Could consider beta-blocker  Anemia: Mild.  Hemoglobin around 10.  Continue to monitor.  Uncontrolled Diabetes Mellitus Type 2 with Hyperglycemia: Management per primary team  Obesity: Management per primary team  Goals of care: Palliative care consulted.  Appreciate help   Recommendations conveyed to primary service.    [facility] 10/17/2024 10:00 AM  ___________________________________________________________  CC: Shortness of breath and orthopnea  Interval History/Subjective: Patient feels great today. Good UOP and Crt continues to improve. Denies SOB, CP, n/v.   Medications:  Current Facility-Administered Medications  Medication Dose Route Frequency Provider Last Rate Last Admin   (feeding supplement) PROSource Plus liquid 30 mL  30 mL Oral Daily Raenelle Coria, MD   30 mL at  10/17/24 0813   acetaminophen  (TYLENOL ) tablet 650 mg  650 mg Oral Q6H PRN Raenelle Coria, MD       Or   acetaminophen  (TYLENOL ) suppository 650 mg  650 mg Rectal Q6H PRN Raenelle Coria, MD       albuterol  (PROVENTIL ) (2.5 MG/3ML) 0.083% nebulizer solution 2.5 mg  2.5 mg Nebulization Q2H PRN Ghimire, Kuber, MD   2.5 mg at 10/14/24 1020   alum & mag hydroxide-simeth (MAALOX/MYLANTA) 200-200-20 MG/5ML suspension 30 mL  30 mL Oral Q2H PRN Raenelle Coria, MD       amLODipine  (NORVASC ) tablet 10 mg  10 mg Oral Daily Ghimire, Kuber, MD   10 mg at 10/17/24 0813   ascorbic acid  (VITAMIN C ) tablet 500 mg  500 mg Oral BID Ghimire, Kuber, MD   500 mg at 10/17/24 0813   aspirin  EC tablet 81 mg  81 mg Oral Daily Ghimire, Kuber, MD   81 mg at 10/17/24 0813   atorvastatin  (LIPITOR) tablet 10 mg  10 mg Oral QHS Ghimire, Kuber, MD   10 mg at 10/16/24 2140   cholecalciferol (VITAMIN D3) 25 MCG (1000 UNIT) tablet 2,000 Units  2,000 Units Oral Daily Ghimire, Kuber, MD   2,000 Units at 10/17/24 0813   docusate sodium  (COLACE) capsule 100 mg  100 mg Oral Daily Ghimire, Kuber, MD   100 mg at 10/17/24 9185   estradiol  (ESTRACE ) vaginal cream 1 Applicatorful  1 Applicatorful Vaginal Daily Raenelle Coria, MD   1 Applicatorful at 10/16/24 1651   fluticasone furoate-vilanterol (BREO ELLIPTA ) 100-25 MCG/ACT 1 puff  1 puff Inhalation Daily Raenelle Coria, MD   1 puff at 10/17/24 0818   gabapentin  (NEURONTIN ) capsule 300 mg  300 mg Oral BID Ghimire, Kuber, MD  300 mg at 10/17/24 0813   heparin  injection 5,000 Units  5,000 Units Subcutaneous Q8H Raenelle Coria, MD   5,000 Units at 10/17/24 9447   insulin  aspart (novoLOG ) injection 0-15 Units  0-15 Units Subcutaneous TID WC Raenelle Coria, MD   3 Units at 10/17/24 0815   insulin  aspart (novoLOG ) injection 0-5 Units  0-5 Units Subcutaneous QHS Raenelle Coria, MD   2 Units at 10/16/24 2141   insulin  glargine-yfgn (SEMGLEE ) injection 50 Units  50 Units Subcutaneous Daily Raenelle Coria, MD   50 Units at 10/17/24 0948   levothyroxine  (SYNTHROID ) tablet 88 mcg  88 mcg Oral QAC breakfast Raenelle Coria, MD   88 mcg at 10/17/24 9447   multivitamin with minerals tablet 1 tablet  1 tablet Oral Daily Raenelle Coria, MD   1 tablet at 10/17/24 0813   nystatin  (MYCOSTATIN /NYSTOP ) topical powder   Topical TID Ricky Fines, MD   Given at 10/17/24 0815   ondansetron  (ZOFRAN ) tablet 4 mg  4 mg Oral Q6H PRN Raenelle Coria, MD       Or   ondansetron  (ZOFRAN ) injection 4 mg  4 mg Intravenous Q6H PRN Raenelle Coria, MD       oxymetazoline (AFRIN) 0.05 % nasal spray 2 spray  2 spray Each Nare BID Shona Terry SAILOR, DO   2 spray at 10/17/24 9673   pantoprazole  (PROTONIX ) EC tablet 40 mg  40 mg Oral Daily Raenelle Coria, MD   40 mg at 10/17/24 9187   potassium chloride  SA (KLOR-CON  M) CR tablet 40 mEq  40 mEq Oral Daily Ghimire, Kuber, MD   40 mEq at 10/17/24 9185   saccharomyces boulardii (FLORASTOR) capsule 250 mg  250 mg Oral BID Raenelle Coria, MD   250 mg at 10/17/24 9183   sertraline  (ZOLOFT ) tablet 100 mg  100 mg Oral Daily Raenelle Coria, MD   100 mg at 10/17/24 0813   torsemide  (DEMADEX ) tablet 40 mg  40 mg Oral BID Macel Jayson PARAS, MD          Review of Systems: 10 systems reviewed and negative except per interval history/subjective  Physical Exam: Vitals:   10/17/24 0810 10/17/24 0822  BP: (!) 164/63   Pulse:    Resp:    Temp:    SpO2:  94%   Total I/O In: -  Out: 450 [Urine:450]  Intake/Output Summary (Last 24 hours) at 10/17/2024 1000 Last data filed at 10/17/2024 9247 Gross per 24 hour  Intake --  Output 2950 ml  Net -2950 ml   Constitutional: Obese, no acute distress ENMT: ears and nose without scars or lesions, MMM CV: normal rate, 1+ edema of the bilateral lower extremities Respiratory: bilateral chest rise with mild increased work of breathing Gastrointestinal: soft, non-tender, no palpable masses or hernias Skin: no visible lesions or rashes Psych:  alert, judgement/insight appropriate, appropriate mood and affect   Test Results I personally reviewed new and old clinical labs and radiology tests Lab Results  Component Value Date   NA 144 10/17/2024   K 4.1 10/17/2024   CL 96 (L) 10/17/2024   CO2 42 (H) 10/17/2024   BUN 46 (H) 10/17/2024   CREATININE 1.95 (H) 10/17/2024   CALCIUM  9.6 10/17/2024   ALBUMIN 3.7 10/14/2024   PHOS 2.5 07/02/2023    CBC Recent Labs  Lab 10/14/24 0425  WBC 9.3  NEUTROABS 7.8*  HGB 10.0*  HCT 32.3*  MCV 88.0  PLT 301

## 2024-10-17 NOTE — Plan of Care (Signed)
  Problem: Health Behavior/Discharge Planning: Goal: Ability to manage health-related needs will improve Outcome: Progressing   Problem: Metabolic: Goal: Ability to maintain appropriate glucose levels will improve Outcome: Progressing   Problem: Skin Integrity: Goal: Risk for impaired skin integrity will decrease Outcome: Progressing   Problem: Tissue Perfusion: Goal: Adequacy of tissue perfusion will improve Outcome: Progressing   Problem: Clinical Measurements: Goal: Ability to maintain clinical measurements within normal limits will improve Outcome: Progressing Goal: Diagnostic test results will improve Outcome: Progressing Goal: Respiratory complications will improve Outcome: Progressing   Problem: Activity: Goal: Risk for activity intolerance will decrease Outcome: Progressing   Problem: Coping: Goal: Level of anxiety will decrease Outcome: Progressing   Problem: Elimination: Goal: Will not experience complications related to urinary retention Outcome: Progressing   Problem: Pain Managment: Goal: General experience of comfort will improve and/or be controlled Outcome: Progressing   Problem: Safety: Goal: Ability to remain free from injury will improve Outcome: Progressing

## 2024-10-17 NOTE — Progress Notes (Signed)
 This nurse was called to pts room for a bed change due to being wet. Noticed pt developed a raspy cough and a slight nose bleed from blowing her nose. Applied cold pack to nose. Pt tolerated well. Will continue to monitor for further changes or decline.

## 2024-10-17 NOTE — Progress Notes (Signed)
 Report called to tiffany at [facility]

## 2024-10-18 DIAGNOSIS — Z79899 Other long term (current) drug therapy: Secondary | ICD-10-CM | POA: Diagnosis not present

## 2024-10-18 DIAGNOSIS — N179 Acute kidney failure, unspecified: Secondary | ICD-10-CM | POA: Diagnosis not present

## 2024-10-18 DIAGNOSIS — J9611 Chronic respiratory failure with hypoxia: Secondary | ICD-10-CM | POA: Diagnosis not present

## 2024-10-18 DIAGNOSIS — I5033 Acute on chronic diastolic (congestive) heart failure: Secondary | ICD-10-CM | POA: Diagnosis not present

## 2024-10-18 DIAGNOSIS — N1832 Chronic kidney disease, stage 3b: Secondary | ICD-10-CM | POA: Diagnosis not present

## 2024-10-18 DIAGNOSIS — J9612 Chronic respiratory failure with hypercapnia: Secondary | ICD-10-CM | POA: Diagnosis not present

## 2024-10-23 ENCOUNTER — Ambulatory Visit: Admitting: Cardiovascular Disease

## 2024-10-24 ENCOUNTER — Encounter: Payer: Self-pay | Admitting: Neurology

## 2024-10-24 DIAGNOSIS — E785 Hyperlipidemia, unspecified: Secondary | ICD-10-CM | POA: Diagnosis not present

## 2024-10-24 DIAGNOSIS — J449 Chronic obstructive pulmonary disease, unspecified: Secondary | ICD-10-CM | POA: Diagnosis not present

## 2024-10-24 DIAGNOSIS — I5032 Chronic diastolic (congestive) heart failure: Secondary | ICD-10-CM | POA: Diagnosis not present

## 2024-10-24 DIAGNOSIS — N1832 Chronic kidney disease, stage 3b: Secondary | ICD-10-CM | POA: Diagnosis not present

## 2024-10-24 DIAGNOSIS — I5033 Acute on chronic diastolic (congestive) heart failure: Secondary | ICD-10-CM | POA: Diagnosis not present

## 2024-10-24 DIAGNOSIS — E1165 Type 2 diabetes mellitus with hyperglycemia: Secondary | ICD-10-CM | POA: Diagnosis not present

## 2024-10-24 DIAGNOSIS — E039 Hypothyroidism, unspecified: Secondary | ICD-10-CM | POA: Diagnosis not present

## 2024-10-24 DIAGNOSIS — J9611 Chronic respiratory failure with hypoxia: Secondary | ICD-10-CM | POA: Diagnosis not present

## 2024-10-24 DIAGNOSIS — E559 Vitamin D deficiency, unspecified: Secondary | ICD-10-CM | POA: Diagnosis not present

## 2024-10-25 ENCOUNTER — Encounter: Payer: Self-pay | Admitting: Nurse Practitioner

## 2024-10-25 ENCOUNTER — Ambulatory Visit (INDEPENDENT_AMBULATORY_CARE_PROVIDER_SITE_OTHER): Admitting: Nurse Practitioner

## 2024-10-25 VITALS — BP 120/62 | HR 74 | Ht 61.0 in | Wt 287.0 lb

## 2024-10-25 DIAGNOSIS — E782 Mixed hyperlipidemia: Secondary | ICD-10-CM | POA: Diagnosis not present

## 2024-10-25 DIAGNOSIS — Z794 Long term (current) use of insulin: Secondary | ICD-10-CM

## 2024-10-25 DIAGNOSIS — I10 Essential (primary) hypertension: Secondary | ICD-10-CM

## 2024-10-25 DIAGNOSIS — E1165 Type 2 diabetes mellitus with hyperglycemia: Secondary | ICD-10-CM

## 2024-10-25 MED ORDER — INSULIN GLARGINE-YFGN 100 UNIT/ML ~~LOC~~ SOLN: 70.0000 [IU] | Freq: Every day | SUBCUTANEOUS | 3 refills | Status: AC

## 2024-10-25 MED ORDER — NOVOLOG 100 UNIT/ML IJ SOLN
14.0000 [IU] | Freq: Three times a day (TID) | INTRAMUSCULAR | 3 refills | Status: AC
Start: 2024-10-25 — End: ?

## 2024-10-25 NOTE — Progress Notes (Signed)
 Endocrinology Follow Up Note       10/25/2024, 1:57 PM   Subjective:    Patient ID: [name], female    DOB: 23-Dec-1960.  [name] is being seen in follow up after being seen in consultation for management of currently uncontrolled symptomatic diabetes requested by  Patient, No Pcp Per.   Past Medical History:  Diagnosis Date   Anemia    Arthritis    Asthma    Cancer of sigmoid (HCC)    COPD (chronic obstructive pulmonary disease) (HCC)    Depression    Diabetes mellitus    Diastolic dysfunction 05/15/2015   Dyspnea    Generalized weakness    Hyperlipidemia    Hypertension    Hypothyroidism    Obesity    Palpitations    Pneumonia    PONV (postoperative nausea and vomiting)    Prolonged QT interval 05/14/2015   Sleep apnea     Past Surgical History:  Procedure Laterality Date   BIOPSY  04/06/2022   Procedure: BIOPSY;  Surgeon: Eartha Angelia Sieving, MD;  [facility];  Service: Gastroenterology;;   CATARACT EXTRACTION     CESAREAN SECTION     CHOLECYSTECTOMY     COLONOSCOPY WITH PROPOFOL  N/A 04/06/2022   Procedure: COLONOSCOPY WITH PROPOFOL ;  Surgeon: Eartha Angelia Sieving, MD;  [facility]: [facility];  Service: Gastroenterology;  Laterality: N/A;  805 ASA 2 patient in [facility]   CYSTOSCOPY WITH INJECTION N/A 02/16/2024   Procedure: CYSTOSCOPY WITH INJECTION- Bladder botox  100 units;  Surgeon: Sherrilee Belvie CROME, MD;  Location: [facility];  Service: Urology;  Laterality: N/A;   ESOPHAGOGASTRODUODENOSCOPY (EGD) WITH PROPOFOL  N/A 04/06/2022   Procedure: ESOPHAGOGASTRODUODENOSCOPY (EGD) WITH PROPOFOL ;  Surgeon: Eartha Angelia Sieving, MD;  [facility];  Service: Gastroenterology;  Laterality: N/A;   HEMOSTASIS CLIP PLACEMENT  04/06/2022   Procedure: HEMOSTASIS CLIP PLACEMENT;  Surgeon: Eartha Angelia Sieving, MD;  [facility];  Service: Gastroenterology;;   POLYPECTOMY  04/06/2022   Procedure: POLYPECTOMY;  Surgeon: Eartha Angelia, Sieving, MD;  [facility];  Service: Gastroenterology;;   SUBMUCOSAL TATTOO INJECTION  04/06/2022   Procedure: SUBMUCOSAL TATTOO INJECTION;  Surgeon: Eartha Angelia, Sieving, MD;  [facility];  Service: Gastroenterology;;    Social History   Socioeconomic History   Marital status: Married    Spouse name: Not on file   Number of children: Not on file   Years of education: Not on file   Highest education level: Not on file  Occupational History   Not on file  Tobacco Use   Smoking status: Never    Passive exposure: Yes   Smokeless tobacco: Never  Vaping Use   Vaping status: Never Used  Substance and Sexual Activity   Alcohol use: No    Alcohol/week: 0.0 standard drinks of alcohol   Drug use: No   Sexual activity: Yes    Birth control/protection: None  Other Topics Concern   Not on file  Social History Narrative   Lives with husband.  One living son.  Daughter died after transplant age 64.     Are you right  handed or left handed? Right   Are you currently employed ? 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    What type of home do you live in: 1 story or 2 story? one       Social Drivers of Corporate Investment Banker Strain: Medium Risk (08/14/2021)   Received from [facility]   Overall Financial Resource Strain (CARDIA)    Difficulty of Paying Living Expenses: Somewhat hard  Food Insecurity: No Food Insecurity (10/14/2024)   Hunger Vital Sign    Worried About Running Out of Food in the Last Year: Never true    Ran Out of Food in the Last Year: Never true  Transportation Needs: No Transportation Needs (10/14/2024)   PRAPARE - Administrator, Civil Service (Medical): No    Lack of Transportation (Non-Medical): No  Physical Activity: Inactive (08/14/2021)   [facility]   Exercise Vital Sign    On average, how many days per week do you engage in moderate to strenuous exercise (like a brisk walk)?: 0 days    On average, how many minutes do you engage in exercise at this level?: 0 min  Stress: Stress Concern Present (08/14/2021)   Received from [facility] - Occupational Stress Questionnaire    Feeling of Stress : Very much  Social Connections: Unknown (04/08/2022)   Received from [facility]   Social Network    Social Network: Not on file    Family History  Problem Relation Age of Onset   Stroke Mother    Diabetes Father    Heart failure Father    Hypertension Father    Diabetes Sister    Heart failure Sister    Hypertension Sister    Stroke Sister    Cancer Other    Stroke Sister     Outpatient Encounter Medications as of 10/25/2024  Medication Sig   albuterol  (VENTOLIN  HFA) 108 (90 Base) MCG/ACT inhaler Inhale 2 puffs into the lungs every 6 (six) hours as needed for wheezing or shortness of breath.   alum & mag hydroxide-simeth (MAALOX/MYLANTA) 200-200-20 MG/5ML suspension Take 30 mLs by mouth every 2 (two) hours as needed for indigestion or heartburn. Do not exceed 4 doses in 24 hours   Amino Acids-Protein Hydrolys (FEEDING SUPPLEMENT, PRO-STAT 64,) LIQD Take 60 mLs by mouth daily.   amLODipine  (NORVASC ) 10 MG tablet Take 10 mg by mouth daily.   ascorbic acid  (VITAMIN C ) 500 MG tablet Take 500 mg by mouth 2 (two) times daily.   aspirin  EC 81 MG tablet Take 81 mg by mouth daily. Swallow whole.   atorvastatin  (LIPITOR) 10 MG tablet Take 10 mg by mouth at bedtime.   augmented betamethasone dipropionate (DIPROLENE-AF) 0.05 % cream Apply 1 application  topically 2 (two) times daily. Apply to trunk, buttocks, legs for Psoriasis   BD AUTOSHIELD DUO 30G X 5 MM MISC    Biotin 10 MG TABS Take 10 mg by mouth daily.   carboxymethylcellulose 1 % ophthalmic solution Place 2 drops into both eyes  every 6 (six) hours as needed (eye dryness/irritation).   cetirizine (ZYRTEC) 10 MG tablet Take 10 mg by mouth daily as needed for allergies.   clotrimazole-betamethasone (LOTRISONE) cream Apply 1 Application topically 2 (two) times a week. Apply on Tuesday and Friday and as needed for management of chronic fungal rash after  shower   Cranberry 300 MG tablet Take 300 mg by mouth 2 (two) times daily.   docusate sodium  (COLACE) 100 MG capsule Take 100 mg by mouth daily.   Emollient (EUCERIN ADVANCED REPAIR) CREA Apply 1 Application topically every 12 (twelve) hours as needed (hydration of skin). Apply to bilateral lower extremities   estradiol  (ESTRACE ) 0.1 MG/GM vaginal cream Discard plastic applicator. Insert a blueberry size amount (approximately 1 gram) of cream on fingertip inside vagina at bedtime every night for 1 week then every other night routinely (for long term use).   fluticasone-salmeterol (ADVAIR) 250-50 MCG/ACT AEPB Inhale 1 puff into the lungs 2 (two) times daily.   gabapentin  (NEURONTIN ) 300 MG capsule Take 1 capsule in AM, 1 capsule in PM (Patient taking differently: Take 300 mg by mouth 2 (two) times daily. Take 1 capsule in AM, 1 capsule in PM)   HYDROcodone-acetaminophen  (NORCO/VICODIN) 5-325 MG tablet Take 1 tablet by mouth every 6 (six) hours as needed (post op pain).   ipratropium-albuterol  (DUONEB) 0.5-2.5 (3) MG/3ML SOLN Take 3 mLs by nebulization every 4 (four) hours as needed (shortness of breath/wheezing).   levothyroxine  (SYNTHROID ) 88 MCG tablet Take 88 mcg by mouth daily before breakfast.   Menthol-Zinc  Oxide (CALMOSEPTINE) 0.44-20.6 % OINT Apply 1 Application topically daily as needed (preservation/protection after incontinent care).   Multiple Vitamins-Minerals (MULTIVITAMIN WITH MINERALS) tablet Take 1 tablet by mouth daily.   MYRBETRIQ  50 MG TB24 tablet Take 1 tablet (50 mg total) by mouth daily.   Nutritional Supplements (BOOST GLUCOSE CONTROL) LIQD Take 240 mLs by  mouth 2 (two) times daily with a meal.   omeprazole (PRILOSEC) 20 MG capsule Take 20 mg by mouth daily.   ondansetron  (ZOFRAN ) 8 MG tablet Take 8 mg by mouth 2 (two) times daily.   OXYGEN  Inhale 2 L into the lungs continuous.   potassium chloride  SA (K-DUR) 20 MEQ tablet Take 20 mEq by mouth daily.   saccharomyces boulardii (FLORASTOR) 250 MG capsule Take 250 mg by mouth 2 (two) times daily.   sertraline  (ZOLOFT ) 100 MG tablet Take 1 tablet (100 mg total) by mouth daily.   sodium chloride  (OCEAN) 0.65 % SOLN nasal spray Place 1 spray into both nostrils every 2 (two) hours as needed (dry nasal passages).   torsemide  40 MG TABS Take 40 mg by mouth 2 (two) times daily.   trimethoprim  (TRIMPEX ) 100 MG tablet Take 1 tablet (100 mg total) by mouth daily.   [DISCONTINUED] insulin  glargine-yfgn (SEMGLEE ) 100 UNIT/ML injection Inject 0.5 mLs (50 Units total) into the skin daily.   [DISCONTINUED] NOVOLOG  100 UNIT/ML injection Inject 14-20 Units into the skin 3 (three) times daily with meals. 90-150= 14 units; 151-200= 15 units; 201-250= 16 units; 251-300= 17 units; 301-350= 18 units; 351-400= 19 units; above 400 = 20 units (Patient taking differently: Inject 14 Units into the skin 3 (three) times daily with meals. 0730, 1130, 1630)   hydrALAZINE  (APRESOLINE ) 25 MG tablet Take 1 tablet (25 mg total) by mouth every 8 (eight) hours. (Patient taking differently: Take 25 mg by mouth every 8 (eight) hours. Hold if systolic BP <110)   insulin  glargine-yfgn (SEMGLEE ) 100 UNIT/ML injection Inject 0.7 mLs (70 Units total) into the skin at bedtime.   NOVOLOG  100 UNIT/ML injection Inject 14-20 Units into the skin 3 (three) times daily with meals. 90-150= 14 units; 151-200= 15 units; 201-250= 16 units; 251-300= 17 units; 301-350= 18 units; 351-400= 19 units; above 400 = 20 units   [DISCONTINUED] insulin   aspart (NOVOLOG  FLEXPEN) 100 UNIT/ML FlexPen Inject 1-6 Units into the skin 3 (three) times daily with meals. Per sliding  scale 151-200 = 1 201-250 = 2 251-300 = 3 301-350 = 4 351-400 = 5 >401 = 6 If greater than 400, give 6 units (16 units total) and call Endocrinology clinic (Patient not taking: Reported on 10/25/2024)   No facility-administered encounter medications on file as of 10/25/2024.    ALLERGIES: Allergies  Allergen Reactions   Carvedilol  Palpitations   Sulfa Antibiotics Swelling    Whole face swells   Trulicity [Dulaglutide] Diarrhea   Benicar [Olmesartan] Swelling   Ceftriaxone  Hives and Rash   Codeine Other (See Comments)    Confusion     VACCINATION STATUS: Immunization History  Administered Date(s) Administered   Influenza,inj,quad, With Preservative 09/24/2015   Pneumococcal Conjugate-13 09/02/2015   Pneumococcal Polysaccharide-23 10/01/2005   Tdap 10/01/2005, 09/02/2015    Diabetes She presents for her follow-up diabetic visit. She has type 2 diabetes mellitus. Onset time: diagnosed approx 50 years ago per husband report. Her disease course has been fluctuating. There are no hypoglycemic associated symptoms. (She did have one episode of hypoglycemia since last visit, no record of it on logs) Associated symptoms include blurred vision, fatigue, foot paresthesias, polydipsia and polyuria. Pertinent negatives for diabetes include no weight loss. There are no hypoglycemic complications. Symptoms are stable. Diabetic complications include a CVA (TIA in past), nephropathy, peripheral neuropathy and PVD. Risk factors for coronary artery disease include diabetes mellitus, dyslipidemia, family history, obesity, hypertension and sedentary lifestyle. Current diabetic treatment includes intensive insulin  program. She is compliant with treatment most of the time (lives in [place]). She is following a generally unhealthy diet. When asked about meal planning, she reported none. She has not had a previous visit with a dietitian. She rarely participates in exercise. Her home blood glucose trend is  fluctuating dramatically. Her overall blood glucose range is >200 mg/dl. (She presents today, accompanied by her husband, with logs showing dramatically fluctuating glucose readings and hyperglycemia overall.  Her most recent A1c on 11/9 was 7.2%, improving from last A1c of 8.6%.  Her insulin  regimen was changed after recent hospitalization, Lantus  was changed to Semglee  and admin time is morning instead of night.  She does have rare hypoglycemia documented.) An ACE inhibitor/angiotensin II receptor blocker is not being taken. She sees a podiatrist.Eye exam is current.     Review of systems  Constitutional: + minimally fluctuating body weight, current Body mass index is 54.23 kg/m., no fatigue, no subjective hyperthermia, no subjective hypothermia Eyes: no blurry vision, no xerophthalmia ENT: no sore throat, no nodules palpated in throat, no dysphagia/odynophagia, no hoarseness Cardiovascular: no chest pain, no shortness of breath, no palpitations, no leg swelling Respiratory: no cough, no shortness of breath Gastrointestinal: no nausea/vomiting/diarrhea Musculoskeletal: essentially WC bound due to deconditioning Skin: no rashes, no hyperemia Neurological: no tremors, no numbness, no tingling, no dizziness Psychiatric: no depression, no anxiety  Objective:     BP 120/62 (BP Location: Left Arm, Patient Position: Sitting, Cuff Size: Large)   Pulse 74   Ht 5' 1 (1.549 m)   Wt 287 lb (130.2 kg) Comment: patient in wheelchair, states that this is her last weight  LMP 08/06/2012   BMI 54.23 kg/m   Wt Readings from Last 3 Encounters:  10/25/24 287 lb (130.2 kg)  10/17/24 290 lb 2 oz (131.6 kg)  09/07/24 (!) 305 lb (138.3 kg)     BP Readings from Last 3 Encounters:  10/25/24 120/62  10/17/24 (!) 164/63  09/08/24 (!) 158/78     Physical Exam- Limited  Constitutional:  Body mass index is 54.23 kg/m. , not in acute distress, normal state of mind Eyes:  EOMI, no  exophthalmos Musculoskeletal: essentially WC bound due to deconditioning Skin:  no rashes, no hyperemia Neurological: no tremor with outstretched hands   Diabetic Foot Exam - Simple   Simple Foot Form Diabetic Foot exam was performed with the following findings: Yes 10/25/2024  1:34 PM  Visual Inspection See comments: Yes Sensation Testing Intact to touch and monofilament testing bilaterally: Yes See comments: Yes Pulse Check Posterior Tibialis and Dorsalis pulse intact bilaterally: Yes Comments Masd between toes on right foot; decreased sensation to monofilament tool bilaterally       CMP ( most recent) CMP     Component Value Date/Time   NA 144 10/17/2024 0354   K 4.1 10/17/2024 0354   CL 96 (L) 10/17/2024 0354   CO2 42 (H) 10/17/2024 0354   GLUCOSE 161 (H) 10/17/2024 0354   BUN 46 (H) 10/17/2024 0354   CREATININE 1.95 (H) 10/17/2024 0354   CALCIUM  9.6 10/17/2024 0354   PROT 6.9 10/14/2024 0425   ALBUMIN 3.7 10/14/2024 0425   AST 15 10/14/2024 0425   ALT 11 10/14/2024 0425   ALKPHOS 101 10/14/2024 0425   BILITOT 0.4 10/14/2024 0425   GFRNONAA 28 (L) 10/17/2024 0354   GFRNONAA 36 02/10/2024 0943     Diabetic Labs (most recent): Lab Results  Component Value Date   HGBA1C 7.2 (H) 10/14/2024   HGBA1C 8.2 (H) 11/13/2023   HGBA1C 8.5 (H) 06/27/2023     Lipid Panel ( most recent) Lipid Panel  No results found for: CHOL, TRIG, HDL, CHOLHDL, VLDL, LDLCALC, LDLDIRECT, LABVLDL          Assessment & Plan:   1) Type 2 diabetes mellitus with hyperglycemia, with long-term current use of insulin  (HCC)  She presents today, accompanied by her husband, with logs showing dramatically fluctuating glucose readings and hyperglycemia overall.  Her most recent A1c on 11/9 was 7.2%, improving from last A1c of 8.6%.  Her insulin  regimen was changed after recent hospitalization, Lantus  was changed to Semglee  and admin time is morning instead of night.  She  does have rare hypoglycemia documented.  [place] Teresa NOVAK Bowerman has currently uncontrolled symptomatic type 2 DM since her 55s.   -Recent labs reviewed.  - I had a long discussion with her about the progressive nature of diabetes and the pathology behind its complications. -her diabetes is complicated by TIA, CKD, PVD, neuropathy and she remains at a high risk for more acute and chronic complications which include CAD, CVA, CKD, retinopathy, and neuropathy. These are all discussed in detail with her.  The following Lifestyle Medicine recommendations according to American College of Lifestyle Medicine [facility]) were discussed and offered to patient and she agrees to start the journey:  A. Whole Foods, Plant-based plate comprising of fruits and vegetables, plant-based proteins, whole-grain carbohydrates was discussed in detail with the patient.   A list for source of those nutrients were also provided to the patient.  Patient will use only water  or unsweetened tea for hydration. B.  The need to stay away from risky substances including alcohol, smoking; obtaining 7 to 9 hours of restorative sleep, at least 150 minutes of moderate intensity exercise weekly, the importance of healthy social connections,  and stress reduction techniques were discussed. C.  A full color page of  Calorie density of various food groups per pound showing examples of each food groups was provided to the patient.  - Nutritional counseling repeated/built upon at each appointment.  - The patient admits there is a room for improvement in their diet and drink choices. -  Suggestion is made for the patient to avoid simple carbohydrates from their diet including Cakes, Sweet Desserts / Pastries, Ice Cream, Soda (diet and regular), Sweet Tea, Candies, Chips, Cookies, Sweet Pastries, Store Bought Juices, Alcohol in Excess of 1-2 drinks a day, Artificial Sweeteners, Coffee Creamer, and Sugar-free Products. This will help patient to have  stable blood glucose profile and potentially avoid unintended weight gain.   - I encouraged the patient to switch to unprocessed or minimally processed complex starch and increased protein intake (animal or plant source), fruits, and vegetables.   - Patient is advised to stick to a routine mealtimes to eat 3 meals a day and avoid unnecessary snacks (to snack only to correct hypoglycemia).  - I have approached her with the following individualized plan to manage her diabetes and patient agrees:   -She is advised to restart Semglee  70 units SQ NIGHTLY and continue Novolog  14-20 units TID with meals if glucose is above 90 and she is eating (Specific instructions on how to titrate insulin  dosage based on glucose readings given to patient in writing).    -she is encouraged to continue monitoring glucose 4 times daily, before meals and before bed, and to call the clinic if she has readings less than 70 or above 300 for 3 tests in a row.    - she is warned not to take insulin  without proper monitoring per orders. - Adjustment parameters are given to her for hypo and hyperglycemia in writing.  - she is not candidate for Trulicity, did not tolerate well in the past, nor Sulfa meds as she is allergic.  She is also not a candidate for Metformin given stage 3 renal failure.  - Specific targets for  A1c; LDL, HDL, and Triglycerides were discussed with the patient.  2) Blood Pressure /Hypertension:  her blood pressure is controlled to target.   she is advised to continue her current medications as prescribed by her PCP.  3) Lipids/Hyperlipidemia:    There is no recent lipid panel available to review .  she is advised to continue Lipitor 10 mg daily at bedtime.  Side effects and precautions discussed with her.  4)  Weight/Diet:  her Body mass index is 54.23 kg/m.  -  clearly complicating her diabetes care.   she is a candidate for weight loss. I discussed with her the fact that loss of 5 - 10% of her   current body weight will have the most impact on her diabetes management.  Exercise, and detailed carbohydrates information provided  -  detailed on discharge instructions.  5) Chronic Care/Health Maintenance: -she is not on ACEI/ARB and is on Statin medications and is encouraged to initiate and continue to follow up with Ophthalmology, Dentist, Podiatrist at least yearly or according to recommendations, and advised to stay away from smoking. I have recommended yearly flu vaccine and pneumonia vaccine at least every 5 years; moderate intensity exercise for up to 150 minutes weekly; and sleep for at least 7 hours a day.  - she is advised to maintain close follow up with Patient, No Pcp Per for primary care needs, as well as her other providers for optimal and coordinated care.    I spent  56  minutes in the care of the patient today including review of labs from CMP, Lipids, Thyroid  Function, Hematology (current and previous including abstractions from other facilities); face-to-face time discussing  her blood glucose readings/logs, discussing hypoglycemia and hyperglycemia episodes and symptoms, medications doses, her options of short and long term treatment based on the latest standards of care / guidelines;  discussion about incorporating lifestyle medicine;  and documenting the encounter. Risk reduction counseling performed per USPSTF guidelines to reduce obesity and cardiovascular risk factors.     Please refer to Patient Instructions for Blood Glucose Monitoring and Insulin /Medications Dosing Guide  in media tab for additional information. Please  also refer to  Patient Self Inventory in the Media  tab for reviewed elements of pertinent patient history.  Teresa NOVAK Picou participated in the discussions, expressed understanding, and voiced agreement with the above plans.  All questions were answered to her satisfaction. she is encouraged to contact clinic should she have any questions or  concerns prior to her return visit.     Follow up plan: - Return in about 4 months (around 02/22/2025) for Diabetes F/U with A1c in office, No previsit labs, Bring meter and logs.   Benton Rio, [facility] [address] [place], [place] [address] Phone: [phone] Fax: [phone]  10/25/2024, 1:57 PM

## 2024-10-26 DIAGNOSIS — D649 Anemia, unspecified: Secondary | ICD-10-CM | POA: Diagnosis not present

## 2024-10-26 DIAGNOSIS — G5601 Carpal tunnel syndrome, right upper limb: Secondary | ICD-10-CM | POA: Diagnosis not present

## 2024-10-30 DIAGNOSIS — L408 Other psoriasis: Secondary | ICD-10-CM | POA: Diagnosis not present

## 2024-11-13 ENCOUNTER — Ambulatory Visit (HOSPITAL_COMMUNITY)

## 2024-11-13 ENCOUNTER — Inpatient Hospital Stay

## 2024-11-14 ENCOUNTER — Ambulatory Visit: Attending: Cardiovascular Disease | Admitting: Cardiovascular Disease

## 2024-11-14 ENCOUNTER — Encounter: Payer: Self-pay | Admitting: Cardiovascular Disease

## 2024-11-14 VITALS — BP 112/60 | HR 75 | Ht 61.0 in | Wt 291.4 lb

## 2024-11-14 DIAGNOSIS — E1159 Type 2 diabetes mellitus with other circulatory complications: Secondary | ICD-10-CM | POA: Diagnosis not present

## 2024-11-14 DIAGNOSIS — I152 Hypertension secondary to endocrine disorders: Secondary | ICD-10-CM

## 2024-11-14 DIAGNOSIS — Z6841 Body Mass Index (BMI) 40.0 and over, adult: Secondary | ICD-10-CM | POA: Diagnosis not present

## 2024-11-14 DIAGNOSIS — I5032 Chronic diastolic (congestive) heart failure: Secondary | ICD-10-CM | POA: Diagnosis not present

## 2024-11-14 DIAGNOSIS — I5041 Acute combined systolic (congestive) and diastolic (congestive) heart failure: Secondary | ICD-10-CM | POA: Insufficient documentation

## 2024-11-14 NOTE — Assessment & Plan Note (Signed)
 Patient was recently mid to the hospital volume overload on 10/14/2024.  She diuresed down to her dry weight of 290 pounds.  Her initial BNP was 853.  She was net -10 pounds.  She is on torsemide  40 mg p.o. twice daily.  She has mild peripheral edema but her lungs are clear today.  Her 2D echocardiogram while in the hospital 10/14/2024 revealed normal LV systolic function, normal diastolic parameters with normal valvular function.

## 2024-11-14 NOTE — Assessment & Plan Note (Signed)
 History of essential hypertension blood pressure measured today 112/60.  She is on amlodipine , hydralazine .

## 2024-11-14 NOTE — Patient Instructions (Signed)
 Medication Instructions:  Your physician recommends that you continue on your current medications as directed. Please refer to the Current Medication list given to you today.  *If you need a refill on your cardiac medications before your next appointment, please call your pharmacy*  Follow-Up: At [facility], you and your health needs are our priority.  As part of our continuing mission to provide you with exceptional heart care, our providers are all part of one team.  This team includes your primary Cardiologist (physician) and Advanced Practice Providers or APPs (Physician Assistants and Nurse Practitioners) who all work together to provide you with the care you need, when you need it.  Your next appointment:   6 month(s)  Provider:   Lynwood Schilling, MD    We recommend signing up for the patient portal called MyChart.  Sign up information is provided on this After Visit Summary.  MyChart is used to connect with patients for Virtual Visits (Telemedicine).  Patients are able to view lab/test results, encounter notes, upcoming appointments, etc.  Non-urgent messages can be sent to your provider as well.   To learn more about what you can do with MyChart, go to [email or web address].

## 2024-11-14 NOTE — Assessment & Plan Note (Signed)
BMI 55 

## 2024-11-14 NOTE — Progress Notes (Signed)
 11/14/2024 [name] Police   10/10/1961  [id number]  Primary Physician Patient, No Pcp Per Primary Cardiologist: Dorn JINNY Lesches MD [facility] [place] [place], [place]  HPI:  [name] is a 63 y.o. morbidly overweight married Caucasian female mother 1 living child, 1 daughter expired, grandmother of 1 grandchild he was in retail her life.  She is referred here to be established after recent hospital discharge.  She is a prior patient of Dr. Denver.  She is accompanied by her husband [name] today.  Her risk factors include treated hypertension, diabetes and hyperlipidemia.  Her father did die of a myocardial infarction at age 51.  Is never had a heart attack or stroke.  She does have chronic diastolic heart failure.  She is on home O2 in the evening hours.  She was admitted on 11 9 for 3 days with respiratory failure.  Initial BNP was 800.  She diuresed 10 pounds and was placed on torsemide  40 mg p.o. twice daily.  She is essentially wheelchair-bound and nonambulatory.  She lives at [facility] assisted care facility getting rehab.   Current Meds  Medication Sig   albuterol  (VENTOLIN  HFA) 108 (90 Base) MCG/ACT inhaler Inhale 2 puffs into the lungs every 6 (six) hours as needed for wheezing or shortness of breath.   alum & mag hydroxide-simeth (MAALOX/MYLANTA) 200-200-20 MG/5ML suspension Take 30 mLs by mouth every 2 (two) hours as needed for indigestion or heartburn. Do not exceed 4 doses in 24 hours   Amino Acids-Protein Hydrolys (FEEDING SUPPLEMENT, PRO-STAT 64,) LIQD Take 60 mLs by mouth daily.   amLODipine  (NORVASC ) 10 MG tablet Take 10 mg by mouth daily.   ascorbic acid  (VITAMIN C ) 500 MG tablet Take 500 mg by mouth 2 (two) times daily.   aspirin  EC 81 MG tablet Take 81 mg by mouth daily. Swallow whole.   atorvastatin  (LIPITOR) 10 MG tablet Take 10 mg by mouth at bedtime.   augmented betamethasone dipropionate (DIPROLENE-AF) 0.05 % cream Apply 1 application  topically 2 (two)  times daily. Apply to trunk, buttocks, legs for Psoriasis   BD AUTOSHIELD DUO 30G X 5 MM MISC    Biotin 10 MG TABS Take 10 mg by mouth daily.   carboxymethylcellulose 1 % ophthalmic solution Place 2 drops into both eyes every 6 (six) hours as needed (eye dryness/irritation).   cetirizine (ZYRTEC) 10 MG tablet Take 10 mg by mouth daily as needed for allergies.   clotrimazole-betamethasone (LOTRISONE) cream Apply 1 Application topically 2 (two) times a week. Apply on Tuesday and Friday and as needed for management of chronic fungal rash after shower   Cranberry 300 MG tablet Take 300 mg by mouth 2 (two) times daily.   docusate sodium  (COLACE) 100 MG capsule Take 100 mg by mouth daily.   Emollient (EUCERIN ADVANCED REPAIR) CREA Apply 1 Application topically every 12 (twelve) hours as needed (hydration of skin). Apply to bilateral lower extremities   estradiol  (ESTRACE ) 0.1 MG/GM vaginal cream Discard plastic applicator. Insert a blueberry size amount (approximately 1 gram) of cream on fingertip inside vagina at bedtime every night for 1 week then every other night routinely (for long term use).   fluticasone -salmeterol (ADVAIR) 250-50 MCG/ACT AEPB Inhale 1 puff into the lungs 2 (two) times daily.   gabapentin  (NEURONTIN ) 300 MG capsule Take 1 capsule in AM, 1 capsule in PM (Patient taking differently: Take 300 mg by mouth 2 (two) times daily. Take 1 capsule in AM, 1 capsule in  PM)   hydrALAZINE  (APRESOLINE ) 25 MG tablet Take 1 tablet (25 mg total) by mouth every 8 (eight) hours. (Patient taking differently: Take 25 mg by mouth every 8 (eight) hours. Hold if systolic BP <110)   HYDROcodone-acetaminophen  (NORCO/VICODIN) 5-325 MG tablet Take 1 tablet by mouth every 6 (six) hours as needed (post op pain).   insulin  glargine-yfgn (SEMGLEE ) 100 UNIT/ML injection Inject 0.7 mLs (70 Units total) into the skin at bedtime.   ipratropium-albuterol  (DUONEB) 0.5-2.5 (3) MG/3ML SOLN Take 3 mLs by nebulization every 4  (four) hours as needed (shortness of breath/wheezing).   levothyroxine  (SYNTHROID ) 88 MCG tablet Take 88 mcg by mouth daily before breakfast.   Menthol-Zinc  Oxide (CALMOSEPTINE) 0.44-20.6 % OINT Apply 1 Application topically daily as needed (preservation/protection after incontinent care).   Multiple Vitamins-Minerals (MULTIVITAMIN WITH MINERALS) tablet Take 1 tablet by mouth daily.   MYRBETRIQ  50 MG TB24 tablet Take 1 tablet (50 mg total) by mouth daily.   NOVOLOG  100 UNIT/ML injection Inject 14-20 Units into the skin 3 (three) times daily with meals. 90-150= 14 units; 151-200= 15 units; 201-250= 16 units; 251-300= 17 units; 301-350= 18 units; 351-400= 19 units; above 400 = 20 units   Nutritional Supplements (BOOST GLUCOSE CONTROL) LIQD Take 240 mLs by mouth 2 (two) times daily with a meal.   omeprazole (PRILOSEC) 20 MG capsule Take 20 mg by mouth daily.   ondansetron  (ZOFRAN ) 8 MG tablet Take 8 mg by mouth 2 (two) times daily.   OXYGEN  Inhale 2 L into the lungs continuous.   potassium chloride  SA (K-DUR) 20 MEQ tablet Take 20 mEq by mouth daily.   saccharomyces boulardii (FLORASTOR) 250 MG capsule Take 250 mg by mouth 2 (two) times daily.   sertraline  (ZOLOFT ) 100 MG tablet Take 1 tablet (100 mg total) by mouth daily.   sodium chloride  (OCEAN) 0.65 % SOLN nasal spray Place 1 spray into both nostrils every 2 (two) hours as needed (dry nasal passages).   torsemide  40 MG TABS Take 40 mg by mouth 2 (two) times daily.   trimethoprim  (TRIMPEX ) 100 MG tablet Take 1 tablet (100 mg total) by mouth daily.     Allergies  Allergen Reactions   Carvedilol  Palpitations   Sulfa Antibiotics Swelling    Whole face swells   Trulicity [Dulaglutide] Diarrhea   Benicar [Olmesartan] Swelling   Ceftriaxone  Hives and Rash   Codeine Other (See Comments)    Confusion     Social History   Socioeconomic History   Marital status: Married    Spouse name: Not on file   Number of children: Not on file   Years  of education: Not on file   Highest education level: Not on file  Occupational History   Not on file  Tobacco Use   Smoking status: Never    Passive exposure: Yes   Smokeless tobacco: Never  Vaping Use   Vaping status: Never Used  Substance and Sexual Activity   Alcohol use: No    Alcohol/week: 0.0 standard drinks of alcohol   Drug use: No   Sexual activity: Yes    Birth control/protection: None  Other Topics Concern   Not on file  Social History Narrative   Lives with husband.  One living son.  Daughter died after transplant age 70.     Are you right handed or left handed? Right   Are you currently employed ? 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    What type of home do you live in: 1 story or 2 story? one       Social Drivers of Corporate Investment Banker Strain: Medium Risk (08/14/2021)   Received from [facility]   Overall Financial Resource Strain (CARDIA)    Difficulty of Paying Living Expenses: Somewhat hard  Food Insecurity: No Food Insecurity (10/14/2024)   Hunger Vital Sign    Worried About Running Out of Food in the Last Year: Never true    Ran Out of Food in the Last Year: Never true  Transportation Needs: No Transportation Needs (10/14/2024)   PRAPARE - Administrator, Civil Service (Medical): No    Lack of Transportation (Non-Medical): No  Physical Activity: Inactive (08/14/2021)   Received from [facility]   Exercise Vital Sign    On average, how many days per week do you engage in moderate to strenuous exercise (like a brisk walk)?: 0 days    On average, how many minutes do you engage in exercise at this level?: 0 min  Stress: Stress Concern Present (08/14/2021)   Received from [facility] - Occupational Stress Questionnaire    Feeling of Stress : Very much  Social Connections: Unknown (04/08/2022)   [facility]   Social Network     Social Network: Not on file  Intimate Partner Violence: Not At Risk (10/14/2024)   Humiliation, Afraid, Rape, and Kick questionnaire    Fear of Current or Ex-Partner: No    Emotionally Abused: No    Physically Abused: No    Sexually Abused: No     Review of Systems: General: negative for chills, fever, night sweats or weight changes.  Cardiovascular: negative for chest pain, dyspnea on exertion, edema, orthopnea, palpitations, paroxysmal nocturnal dyspnea or shortness of breath Dermatological: negative for rash Respiratory: negative for cough or wheezing Urologic: negative for hematuria Abdominal: negative for nausea, vomiting, diarrhea, bright red blood per rectum, melena, or hematemesis Neurologic: negative for visual changes, syncope, or dizziness All other systems reviewed and are otherwise negative except as noted above.    Blood pressure 112/60, pulse 75, height 5' 1 (1.549 m), weight 291 lb 6.4 oz (132.2 kg), last menstrual period 08/06/2012, SpO2 95%.  General appearance: alert and no distress Neck: no adenopathy, no carotid bruit, no JVD, supple, symmetrical, trachea midline, and thyroid  not enlarged, symmetric, no tenderness/mass/nodules Lungs: clear to auscultation bilaterally Heart: regular rate and rhythm, S1, S2 normal, no murmur, click, rub or gallop Extremities: Trace bilateral lower extremity edema Pulses: 2+ and symmetric Skin: Skin color, texture, turgor normal. No rashes or lesions Neurologic: Grossly normal  EKG not performed today      ASSESSMENT AND PLAN:   Hypertension associated with diabetes (HCC) History of essential hypertension blood pressure measured today 112/60.  She is on amlodipine , hydralazine .  Morbid obesity with BMI of 50.0-59.9, adult (HCC) BMI 55.  Chronic diastolic heart failure (HCC) Patient was recently mid to the hospital volume overload on 10/14/2024.  She diuresed down to her dry weight of 290 pounds.  Her initial BNP was  853.  She was net -10 pounds.  She is on torsemide  40 mg p.o. twice daily.  She has mild peripheral edema but her lungs are clear today.  Her 2D echocardiogram while in the hospital 10/14/2024 revealed normal LV systolic function, normal diastolic parameters with normal valvular function.     Dorn  DOROTHA Lesches MD FACP,FACC,FAHA, [facility] 11/14/2024 1:47 PM

## 2024-11-20 ENCOUNTER — Inpatient Hospital Stay: Admitting: Oncology

## 2024-12-03 ENCOUNTER — Encounter: Payer: Self-pay | Admitting: *Deleted

## 2024-12-12 ENCOUNTER — Other Ambulatory Visit (HOSPITAL_COMMUNITY): Payer: Self-pay | Admitting: Oncology

## 2024-12-12 ENCOUNTER — Other Ambulatory Visit: Payer: Self-pay | Admitting: Hematology

## 2024-12-12 ENCOUNTER — Ambulatory Visit (HOSPITAL_COMMUNITY)
Admission: RE | Admit: 2024-12-12 | Discharge: 2024-12-12 | Disposition: A | Discharge status: Home / Self Care | Source: Ambulatory Visit | Attending: Hematology | Admitting: Hematology

## 2024-12-12 ENCOUNTER — Inpatient Hospital Stay: Attending: Oncology

## 2024-12-12 DIAGNOSIS — Z85038 Personal history of other malignant neoplasm of large intestine: Secondary | ICD-10-CM | POA: Insufficient documentation

## 2024-12-12 DIAGNOSIS — D631 Anemia in chronic kidney disease: Secondary | ICD-10-CM | POA: Insufficient documentation

## 2024-12-12 DIAGNOSIS — C187 Malignant neoplasm of sigmoid colon: Secondary | ICD-10-CM

## 2024-12-12 DIAGNOSIS — D508 Other iron deficiency anemias: Secondary | ICD-10-CM

## 2024-12-12 DIAGNOSIS — D509 Iron deficiency anemia, unspecified: Secondary | ICD-10-CM | POA: Insufficient documentation

## 2024-12-12 DIAGNOSIS — N189 Chronic kidney disease, unspecified: Secondary | ICD-10-CM | POA: Insufficient documentation

## 2024-12-12 LAB — COMPREHENSIVE METABOLIC PANEL WITH GFR
ALT: 22 U/L (ref 0–44)
AST: 26 U/L (ref 15–41)
Albumin: 3.7 g/dL (ref 3.5–5.0)
Alkaline Phosphatase: 123 U/L (ref 38–126)
Anion gap: 15 (ref 5–15)
BUN: 52 mg/dL — ABNORMAL HIGH (ref 8–23)
CO2: 30 mmol/L (ref 22–32)
Calcium: 9.3 mg/dL (ref 8.9–10.3)
Chloride: 94 mmol/L — ABNORMAL LOW (ref 98–111)
Creatinine, Ser: 2.45 mg/dL — ABNORMAL HIGH (ref 0.44–1.00)
GFR, Estimated: 21 mL/min — ABNORMAL LOW
Glucose, Bld: 299 mg/dL — ABNORMAL HIGH (ref 70–99)
Potassium: 3.7 mmol/L (ref 3.5–5.1)
Sodium: 139 mmol/L (ref 135–145)
Total Bilirubin: 0.3 mg/dL (ref 0.0–1.2)
Total Protein: 6.9 g/dL (ref 6.5–8.1)

## 2024-12-12 LAB — IRON AND TIBC
Iron: 47 ug/dL (ref 28–170)
Saturation Ratios: 18 % (ref 10.4–31.8)
TIBC: 260 ug/dL (ref 250–450)
UIBC: 213 ug/dL

## 2024-12-12 LAB — CBC WITH DIFFERENTIAL/PLATELET
Abs Immature Granulocytes: 0.03 K/uL (ref 0.00–0.07)
Basophils Absolute: 0 K/uL (ref 0.0–0.1)
Basophils Relative: 0 %
Eosinophils Absolute: 0 K/uL (ref 0.0–0.5)
Eosinophils Relative: 0 %
HCT: 36.5 % (ref 36.0–46.0)
Hemoglobin: 11.8 g/dL — ABNORMAL LOW (ref 12.0–15.0)
Immature Granulocytes: 0 %
Lymphocytes Relative: 25 %
Lymphs Abs: 1.8 K/uL (ref 0.7–4.0)
MCH: 25.9 pg — ABNORMAL LOW (ref 26.0–34.0)
MCHC: 32.3 g/dL (ref 30.0–36.0)
MCV: 80.2 fL (ref 80.0–100.0)
Monocytes Absolute: 0.5 K/uL (ref 0.1–1.0)
Monocytes Relative: 6 %
Neutro Abs: 4.9 K/uL (ref 1.7–7.7)
Neutrophils Relative %: 69 %
Platelets: 311 K/uL (ref 150–400)
RBC: 4.55 MIL/uL (ref 3.87–5.11)
RDW: 15.2 % (ref 11.5–15.5)
WBC: 7.3 K/uL (ref 4.0–10.5)
nRBC: 0 % (ref 0.0–0.2)

## 2024-12-12 LAB — FERRITIN: Ferritin: 77 ng/mL (ref 11–307)

## 2024-12-13 LAB — CEA: CEA: 3.5 ng/mL (ref 0.0–4.7)

## 2024-12-16 ENCOUNTER — Other Ambulatory Visit: Payer: Self-pay

## 2024-12-16 ENCOUNTER — Emergency Department (HOSPITAL_COMMUNITY)

## 2024-12-16 ENCOUNTER — Observation Stay (HOSPITAL_COMMUNITY)
Admission: EM | Admit: 2024-12-16 | Discharge: 2024-12-18 | Disposition: A | Discharge status: Skilled Nursing Facility | Source: Skilled Nursing Facility | Attending: Internal Medicine | Admitting: Internal Medicine

## 2024-12-16 ENCOUNTER — Encounter (HOSPITAL_COMMUNITY): Payer: Self-pay | Admitting: Emergency Medicine

## 2024-12-16 DIAGNOSIS — K625 Hemorrhage of anus and rectum: Secondary | ICD-10-CM | POA: Diagnosis present

## 2024-12-16 DIAGNOSIS — E782 Mixed hyperlipidemia: Secondary | ICD-10-CM | POA: Diagnosis not present

## 2024-12-16 DIAGNOSIS — K921 Melena: Secondary | ICD-10-CM | POA: Diagnosis not present

## 2024-12-16 DIAGNOSIS — J9621 Acute and chronic respiratory failure with hypoxia: Secondary | ICD-10-CM | POA: Insufficient documentation

## 2024-12-16 DIAGNOSIS — D62 Acute posthemorrhagic anemia: Secondary | ICD-10-CM | POA: Insufficient documentation

## 2024-12-16 DIAGNOSIS — Z79899 Other long term (current) drug therapy: Secondary | ICD-10-CM | POA: Diagnosis not present

## 2024-12-16 DIAGNOSIS — E039 Hypothyroidism, unspecified: Secondary | ICD-10-CM | POA: Insufficient documentation

## 2024-12-16 DIAGNOSIS — K6389 Other specified diseases of intestine: Secondary | ICD-10-CM | POA: Diagnosis not present

## 2024-12-16 DIAGNOSIS — J4489 Other specified chronic obstructive pulmonary disease: Secondary | ICD-10-CM | POA: Insufficient documentation

## 2024-12-16 DIAGNOSIS — Z6841 Body Mass Index (BMI) 40.0 and over, adult: Secondary | ICD-10-CM | POA: Diagnosis not present

## 2024-12-16 DIAGNOSIS — F32A Depression, unspecified: Secondary | ICD-10-CM | POA: Insufficient documentation

## 2024-12-16 DIAGNOSIS — Z980 Intestinal bypass and anastomosis status: Secondary | ICD-10-CM | POA: Insufficient documentation

## 2024-12-16 DIAGNOSIS — J9622 Acute and chronic respiratory failure with hypercapnia: Secondary | ICD-10-CM | POA: Insufficient documentation

## 2024-12-16 DIAGNOSIS — K648 Other hemorrhoids: Secondary | ICD-10-CM | POA: Insufficient documentation

## 2024-12-16 DIAGNOSIS — Z794 Long term (current) use of insulin: Secondary | ICD-10-CM | POA: Diagnosis not present

## 2024-12-16 DIAGNOSIS — N184 Chronic kidney disease, stage 4 (severe): Secondary | ICD-10-CM | POA: Insufficient documentation

## 2024-12-16 DIAGNOSIS — D123 Benign neoplasm of transverse colon: Secondary | ICD-10-CM | POA: Diagnosis not present

## 2024-12-16 DIAGNOSIS — I130 Hypertensive heart and chronic kidney disease with heart failure and stage 1 through stage 4 chronic kidney disease, or unspecified chronic kidney disease: Secondary | ICD-10-CM | POA: Insufficient documentation

## 2024-12-16 DIAGNOSIS — I5032 Chronic diastolic (congestive) heart failure: Secondary | ICD-10-CM | POA: Insufficient documentation

## 2024-12-16 DIAGNOSIS — E1165 Type 2 diabetes mellitus with hyperglycemia: Secondary | ICD-10-CM

## 2024-12-16 DIAGNOSIS — E1122 Type 2 diabetes mellitus with diabetic chronic kidney disease: Secondary | ICD-10-CM | POA: Diagnosis not present

## 2024-12-16 DIAGNOSIS — Z85038 Personal history of other malignant neoplasm of large intestine: Secondary | ICD-10-CM | POA: Insufficient documentation

## 2024-12-16 DIAGNOSIS — I10 Essential (primary) hypertension: Secondary | ICD-10-CM | POA: Diagnosis present

## 2024-12-16 LAB — GLUCOSE, CAPILLARY
Glucose-Capillary: 321 mg/dL — ABNORMAL HIGH (ref 70–99)
Glucose-Capillary: 324 mg/dL — ABNORMAL HIGH (ref 70–99)

## 2024-12-16 LAB — COMPREHENSIVE METABOLIC PANEL WITH GFR
ALT: 14 U/L (ref 0–44)
AST: 21 U/L (ref 15–41)
Albumin: 3.6 g/dL (ref 3.5–5.0)
Alkaline Phosphatase: 110 U/L (ref 38–126)
Anion gap: 6 (ref 5–15)
BUN: 33 mg/dL — ABNORMAL HIGH (ref 8–23)
CO2: 36 mmol/L — ABNORMAL HIGH (ref 22–32)
Calcium: 9.5 mg/dL (ref 8.9–10.3)
Chloride: 100 mmol/L (ref 98–111)
Creatinine, Ser: 2.06 mg/dL — ABNORMAL HIGH (ref 0.44–1.00)
GFR, Estimated: 26 mL/min — ABNORMAL LOW
Glucose, Bld: 85 mg/dL (ref 70–99)
Potassium: 3.7 mmol/L (ref 3.5–5.1)
Sodium: 142 mmol/L (ref 135–145)
Total Bilirubin: 0.3 mg/dL (ref 0.0–1.2)
Total Protein: 6.9 g/dL (ref 6.5–8.1)

## 2024-12-16 LAB — PROTIME-INR
INR: 0.9 (ref 0.8–1.2)
Prothrombin Time: 12.7 s (ref 11.4–15.2)

## 2024-12-16 LAB — CBC WITH DIFFERENTIAL/PLATELET
Abs Immature Granulocytes: 0.05 K/uL (ref 0.00–0.07)
Basophils Absolute: 0 K/uL (ref 0.0–0.1)
Basophils Relative: 0 %
Eosinophils Absolute: 0.6 K/uL — ABNORMAL HIGH (ref 0.0–0.5)
Eosinophils Relative: 6 %
HCT: 36.8 % (ref 36.0–46.0)
Hemoglobin: 12.1 g/dL (ref 12.0–15.0)
Immature Granulocytes: 1 %
Lymphocytes Relative: 15 %
Lymphs Abs: 1.4 K/uL (ref 0.7–4.0)
MCH: 26.4 pg (ref 26.0–34.0)
MCHC: 32.9 g/dL (ref 30.0–36.0)
MCV: 80.2 fL (ref 80.0–100.0)
Monocytes Absolute: 0.5 K/uL (ref 0.1–1.0)
Monocytes Relative: 5 %
Neutro Abs: 7.2 K/uL (ref 1.7–7.7)
Neutrophils Relative %: 73 %
Platelets: 328 K/uL (ref 150–400)
RBC: 4.59 MIL/uL (ref 3.87–5.11)
RDW: 15.4 % (ref 11.5–15.5)
WBC: 9.8 K/uL (ref 4.0–10.5)
nRBC: 0 % (ref 0.0–0.2)

## 2024-12-16 LAB — TYPE AND SCREEN
ABO/RH(D): O POS
Antibody Screen: NEGATIVE

## 2024-12-16 LAB — POC OCCULT BLOOD, ED: Fecal Occult Bld: POSITIVE — AB

## 2024-12-16 MED ORDER — PANTOPRAZOLE SODIUM 40 MG PO TBEC
40.0000 mg | DELAYED_RELEASE_TABLET | Freq: Every day | ORAL | Status: DC
  Administered 2024-12-16 – 2024-12-18 (×3): 40 mg via ORAL
  Filled 2024-12-16 (×3): qty 1

## 2024-12-16 MED ORDER — ACETAMINOPHEN 325 MG PO TABS
650.0000 mg | ORAL_TABLET | Freq: Four times a day (QID) | ORAL | Status: DC | PRN
  Administered 2024-12-17: 650 mg via ORAL
  Filled 2024-12-16: qty 2

## 2024-12-16 MED ORDER — TRIMETHOPRIM 100 MG PO TABS
100.0000 mg | ORAL_TABLET | Freq: Every day | ORAL | Status: DC
  Administered 2024-12-17 – 2024-12-18 (×2): 100 mg via ORAL
  Filled 2024-12-16 (×3): qty 1

## 2024-12-16 MED ORDER — LEVOTHYROXINE SODIUM 88 MCG PO TABS
88.0000 ug | ORAL_TABLET | Freq: Every day | ORAL | Status: DC
  Filled 2024-12-16 (×2): qty 1

## 2024-12-16 MED ORDER — PROCHLORPERAZINE EDISYLATE 10 MG/2ML IJ SOLN: 10.0000 mg | Freq: Four times a day (QID) | INTRAMUSCULAR | Status: DC | PRN

## 2024-12-16 MED ORDER — ACETAMINOPHEN 650 MG RE SUPP: 650.0000 mg | Freq: Four times a day (QID) | RECTAL | Status: DC | PRN

## 2024-12-16 MED ORDER — MIRABEGRON ER 25 MG PO TB24
50.0000 mg | ORAL_TABLET | Freq: Every day | ORAL | Status: DC
  Administered 2024-12-17 – 2024-12-18 (×2): 50 mg via ORAL
  Filled 2024-12-16 (×2): qty 2

## 2024-12-16 MED ORDER — VITAMIN C 500 MG PO TABS
500.0000 mg | ORAL_TABLET | Freq: Two times a day (BID) | ORAL | Status: DC
  Administered 2024-12-16 – 2024-12-18 (×4): 500 mg via ORAL
  Filled 2024-12-16 (×4): qty 1

## 2024-12-16 MED ORDER — POLYETHYLENE GLYCOL 3350 17 GM/SCOOP PO POWD
238.0000 g | Freq: Once | ORAL | Status: AC
  Administered 2024-12-16: 238 g via ORAL
  Filled 2024-12-16: qty 238

## 2024-12-16 MED ORDER — FLUTICASONE FUROATE-VILANTEROL 200-25 MCG/ACT IN AEPB
1.0000 | INHALATION_SPRAY | Freq: Every day | RESPIRATORY_TRACT | Status: DC
  Administered 2024-12-17 – 2024-12-18 (×2): 1 via RESPIRATORY_TRACT
  Filled 2024-12-16: qty 28

## 2024-12-16 MED ORDER — TORSEMIDE 20 MG PO TABS
40.0000 mg | ORAL_TABLET | Freq: Two times a day (BID) | ORAL | Status: DC
  Administered 2024-12-17 – 2024-12-18 (×3): 40 mg via ORAL
  Filled 2024-12-16 (×3): qty 2

## 2024-12-16 MED ORDER — HYDRALAZINE HCL 25 MG PO TABS
25.0000 mg | ORAL_TABLET | Freq: Three times a day (TID) | ORAL | Status: DC
  Administered 2024-12-16 – 2024-12-17 (×4): 25 mg via ORAL
  Filled 2024-12-16 (×5): qty 1

## 2024-12-16 MED ORDER — ATORVASTATIN CALCIUM 10 MG PO TABS
10.0000 mg | ORAL_TABLET | Freq: Every day | ORAL | Status: DC
  Administered 2024-12-16 – 2024-12-17 (×2): 10 mg via ORAL
  Filled 2024-12-16 (×2): qty 1

## 2024-12-16 MED ORDER — SODIUM CHLORIDE 0.9 % IV BOLUS
500.0000 mL | Freq: Once | INTRAVENOUS | Status: AC
  Administered 2024-12-16: 500 mL via INTRAVENOUS

## 2024-12-16 MED ORDER — GABAPENTIN 300 MG PO CAPS
300.0000 mg | ORAL_CAPSULE | Freq: Two times a day (BID) | ORAL | Status: DC
  Administered 2024-12-16 – 2024-12-18 (×4): 300 mg via ORAL
  Filled 2024-12-16 (×4): qty 1

## 2024-12-16 MED ORDER — POTASSIUM CHLORIDE CRYS ER 20 MEQ PO TBCR
20.0000 meq | EXTENDED_RELEASE_TABLET | Freq: Every day | ORAL | Status: DC
  Administered 2024-12-17 – 2024-12-18 (×2): 20 meq via ORAL
  Filled 2024-12-16 (×2): qty 1

## 2024-12-16 MED ORDER — AMLODIPINE BESYLATE 5 MG PO TABS
10.0000 mg | ORAL_TABLET | Freq: Every day | ORAL | Status: DC
  Administered 2024-12-17 – 2024-12-18 (×2): 10 mg via ORAL
  Filled 2024-12-16 (×2): qty 2

## 2024-12-16 MED ORDER — HYDROCERIN EX CREA
TOPICAL_CREAM | Freq: Two times a day (BID) | CUTANEOUS | Status: DC
  Filled 2024-12-16: qty 113

## 2024-12-16 MED ORDER — SERTRALINE HCL 50 MG PO TABS
100.0000 mg | ORAL_TABLET | Freq: Every day | ORAL | Status: DC
  Administered 2024-12-17 – 2024-12-18 (×2): 100 mg via ORAL
  Filled 2024-12-16 (×2): qty 2

## 2024-12-16 MED ORDER — INSULIN GLARGINE-YFGN 100 UNIT/ML ~~LOC~~ SOLN
10.0000 [IU] | Freq: Every day | SUBCUTANEOUS | Status: DC
  Administered 2024-12-17 – 2024-12-18 (×2): 10 [IU] via SUBCUTANEOUS
  Filled 2024-12-16 (×3): qty 0.1

## 2024-12-16 MED ORDER — BOOST GLUCOSE CONTROL PO LIQD: 240.0000 mL | Freq: Two times a day (BID) | ORAL | Status: DC

## 2024-12-16 MED ORDER — ADULT MULTIVITAMIN W/MINERALS CH
1.0000 | ORAL_TABLET | Freq: Every day | ORAL | Status: DC
  Administered 2024-12-17 – 2024-12-18 (×2): 1 via ORAL
  Filled 2024-12-16 (×2): qty 1

## 2024-12-16 MED ORDER — BIOTIN 10 MG PO TABS: 10.0000 mg | ORAL_TABLET | Freq: Every day | ORAL | Status: DC

## 2024-12-16 MED ORDER — INSULIN ASPART 100 UNIT/ML IJ SOLN
0.0000 [IU] | INTRAMUSCULAR | Status: DC
  Administered 2024-12-17: 5 [IU] via SUBCUTANEOUS
  Administered 2024-12-17 (×2): 8 [IU] via SUBCUTANEOUS
  Administered 2024-12-17: 11 [IU] via SUBCUTANEOUS
  Administered 2024-12-17: 5 [IU] via SUBCUTANEOUS
  Administered 2024-12-18: 3 [IU] via SUBCUTANEOUS
  Administered 2024-12-18: 2 [IU] via SUBCUTANEOUS
  Administered 2024-12-18: 5 [IU] via SUBCUTANEOUS
  Administered 2024-12-18: 8 [IU] via SUBCUTANEOUS
  Filled 2024-12-16 (×8): qty 1

## 2024-12-16 NOTE — ED Notes (Signed)
 ED Provider at bedside.

## 2024-12-16 NOTE — Progress Notes (Signed)
 Was contacted by ER Dr. Dean in regards to this patient. Presented to [facility] today from [facility] with complaint of rectal bleeding. Reportedly had BM after breakfast that was bright red containing clots. Per Dr Dean, also with moderate amount of red blood in her brief in ER.   In the ER found to be hemodynamically stable, Hgb at baseline.   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 No colonoscopy since prior to surgery.   Patient has agreed to colonoscopy tomorrow to further evaluate. Will prep colon tonight in anticipation. Clear liquid diet today.   Continue to monitor H&H and transfuse for <7.   Formal GI consultation in the AM.

## 2024-12-16 NOTE — Hospital Course (Signed)
"   64 year old female  with medical history significant of COPD on 2 to 3 L of oxygen , DM2, hypertension, CKD 3B, chronic diastolic heart failure, morbid obesity BMI of 50, chronic respiratory failure  immobility and mostly wheelchair-bound, peripheral neuropathy, hypertension, colon cancer s/p resection (08/11/22 LOA Dr. Debby) in remission presents from [facility] with hematochezia.  The patient had a bowel movement after breakfast and noted hematochezia with clots.  She denied any fevers, chills, dizziness, chest pain, shortness breath, nausea, vomiting, diarrhea, abdominal pain.  She denies any new medications.  She is not on any blood thinners. The patient denies any new medications.   The patient has not had any endoscopies since her surgery.  In the ED, the patient was afebrile and hemodynamically stable with oxygen  saturation 96% room air.  WBC 9.8, hemoglobin 12.1, platelets 328.  Sodium 142, potassium 3.7, bicarbonate 36, serum creatinine 2.06.  INR 0.9.  LFTs unremarkable.  FOBT was positive.  Chest x-ray was negative for acute findings. EDP consulted GI, Dr. Cindie.  He recommended admission for colonoscopy. "

## 2024-12-16 NOTE — ED Notes (Signed)
 Xray at bedside

## 2024-12-16 NOTE — Progress Notes (Signed)
 Pt req we check her CBG d/t drinking the gatorade and bowel prep. CBG was 321 @ 2104. Pt states she takes 70 u of insulin  glargine at bedtime. Confirmed in home med list. 10 u of insuline glargine scheduled for 1000 1/12. Provider notified

## 2024-12-16 NOTE — H&P (Signed)
 " History and Physical    Patient: [name] FMW:[id number] DOB: 1961/03/04 DOA: 12/16/2024 DOS: the patient was seen and examined on 12/16/2024 PCP: Patient, No Pcp Per  Patient coming from: SNF  Chief Complaint:  Chief Complaint  Patient presents with   Rectal Bleeding   HPI: [name] is a  64 year old female  with medical history significant of COPD on 2 to 3 L of oxygen , DM2, hypertension, CKD 3B, chronic diastolic heart failure, morbid obesity BMI of 50, chronic respiratory failure  immobility and mostly wheelchair-bound, peripheral neuropathy, hypertension, colon cancer s/p resection (08/11/22 LOA Dr. Debby) in remission presents from [facility] with hematochezia.  The patient had a bowel movement after breakfast and noted hematochezia with clots.  She denied any fevers, chills, dizziness, chest pain, shortness breath, nausea, vomiting, diarrhea, abdominal pain.  She denies any new medications.  She is not on any blood thinners. The patient denies any new medications.   The patient has not had any endoscopies since her surgery.  In the ED, the patient was afebrile and hemodynamically stable with oxygen  saturation 96% room air.  WBC 9.8, hemoglobin 12.1, platelets 328.  Sodium 142, potassium 3.7, bicarbonate 36, serum creatinine 2.06.  INR 0.9.  LFTs unremarkable.  FOBT was positive.  Chest x-ray was negative for acute findings. EDP consulted GI, Dr. Cindie.  He recommended admission for colonoscopy.  Review of Systems: As mentioned in the history of present illness. All other systems reviewed and are negative. Past Medical History:  Diagnosis Date   Anemia    Arthritis    Asthma    Cancer of sigmoid (HCC)    COPD (chronic obstructive pulmonary disease) (HCC)    Depression    Diabetes mellitus    Diastolic dysfunction 05/15/2015   Dyspnea    Generalized weakness    Hyperlipidemia    Hypertension    Hypothyroidism    Obesity    Palpitations    Pneumonia     PONV (postoperative nausea and vomiting)    Prolonged QT interval 05/14/2015   Sleep apnea    Past Surgical History:  Procedure Laterality Date   BIOPSY  04/06/2022   Procedure: BIOPSY;  Surgeon: Eartha Angelia Sieving, MD;  [facility]: [facility];  Service: Gastroenterology;;   CATARACT EXTRACTION     CESAREAN SECTION     CHOLECYSTECTOMY     COLONOSCOPY WITH PROPOFOL  N/A 04/06/2022   Procedure: COLONOSCOPY WITH PROPOFOL ;  Surgeon: Eartha Angelia Sieving, MD;  Location: [facility];  Service: Gastroenterology;  Laterality: N/A;  805 ASA 2 patient in [facility]   CYSTOSCOPY WITH INJECTION N/A 02/16/2024   Procedure: CYSTOSCOPY WITH INJECTION- Bladder botox  100 units;  Surgeon: Sherrilee Belvie CROME, MD;  Location: [facility];  Service: Urology;  Laterality: N/A;   ESOPHAGOGASTRODUODENOSCOPY (EGD) WITH PROPOFOL  N/A 04/06/2022   Procedure: ESOPHAGOGASTRODUODENOSCOPY (EGD) WITH PROPOFOL ;  Surgeon: Eartha Angelia Sieving, MD;  Location: [facility];  Service: Gastroenterology;  Laterality: N/A;   HEMOSTASIS CLIP PLACEMENT  04/06/2022   Procedure: HEMOSTASIS CLIP PLACEMENT;  Surgeon: Eartha Angelia Sieving, MD;  Location: [facility];  Service: Gastroenterology;;   POLYPECTOMY  04/06/2022   Procedure: POLYPECTOMY;  Surgeon: Eartha Angelia Sieving, MD;  Location: [facility];  Service: Gastroenterology;;   SUBMUCOSAL TATTOO INJECTION  04/06/2022   Procedure: SUBMUCOSAL TATTOO INJECTION;  Surgeon: Eartha Angelia Sieving, MD;  Location: [facility];  Service: Gastroenterology;;   Social History:  reports that she has never  smoked. She has been exposed to tobacco smoke. She has never used smokeless tobacco. She reports that she does not drink alcohol and does not use drugs.  Allergies[1]  Family History  Problem Relation Age of Onset   Stroke Mother    Diabetes Father    Heart failure Father    Hypertension Father    Diabetes Sister    Heart failure Sister     Hypertension Sister    Stroke Sister    Cancer Other    Stroke Sister     Prior to Admission medications  Medication Sig Start Date End Date Taking? Authorizing Provider  albuterol  (VENTOLIN  HFA) 108 (90 Base) MCG/ACT inhaler Inhale 2 puffs into the lungs every 6 (six) hours as needed for wheezing or shortness of breath.    [provider]  alum & mag hydroxide-simeth (MAALOX/MYLANTA) 200-200-20 MG/5ML suspension Take 30 mLs by mouth every 2 (two) hours as needed for indigestion or heartburn. Do not exceed 4 doses in 24 hours    [provider]  Amino Acids-Protein Hydrolys (FEEDING SUPPLEMENT, PRO-STAT 64,) LIQD Take 60 mLs by mouth daily.    [provider]  amLODipine  (NORVASC ) 10 MG tablet Take 10 mg by mouth daily. 12/30/23   [provider]  ascorbic acid  (VITAMIN C ) 500 MG tablet Take 500 mg by mouth 2 (two) times daily.    [provider]  aspirin  EC 81 MG tablet Take 81 mg by mouth daily. Swallow whole.    [provider]  atorvastatin  (LIPITOR) 10 MG tablet Take 10 mg by mouth at bedtime. 09/29/21   [provider]  augmented betamethasone dipropionate (DIPROLENE-AF) 0.05 % cream Apply 1 application  topically 2 (two) times daily. Apply to trunk, buttocks, legs for Psoriasis    [provider]  BD AUTOSHIELD DUO 30G X 5 MM MISC  12/31/23   [provider]  Biotin  10 MG TABS Take 10 mg by mouth daily.    [provider]  carboxymethylcellulose 1 % ophthalmic solution Place 2 drops into both eyes every 6 (six) hours as needed (eye dryness/irritation).    [provider]  cetirizine (ZYRTEC) 10 MG tablet Take 10 mg by mouth daily as needed for allergies.    [provider]  clotrimazole-betamethasone (LOTRISONE) cream Apply 1 Application topically 2 (two) times a week. Apply on Tuesday and Friday and as needed for management of chronic fungal rash after shower 03/29/24   [provider]  Cranberry 300 MG tablet Take 300 mg by mouth 2 (two) times daily.    [provider]  docusate sodium  (COLACE) 100 MG capsule Take 100 mg by mouth daily.    [provider]  Emollient (EUCERIN ADVANCED REPAIR) CREA Apply 1 Application topically every 12 (twelve) hours as needed (hydration of skin). Apply to bilateral lower extremities    [provider]  estradiol  (ESTRACE ) 0.1 MG/GM vaginal cream Discard plastic applicator. Insert a blueberry size amount (approximately 1 gram) of cream on fingertip inside vagina at bedtime every night for 1 week then every other night routinely (for long term use). 09/09/23   Gerldine Lauraine BROCKS, FNP  fluticasone -salmeterol (ADVAIR) 250-50 MCG/ACT AEPB Inhale 1 puff into the lungs 2 (two) times daily. 12/24/23   [provider]  gabapentin  (NEURONTIN ) 300 MG capsule Take 1 capsule in AM, 1 capsule in PM Patient taking differently: Take 300 mg by mouth 2 (two) times daily. Take 1 capsule in AM, 1 capsule in  PM 09/30/23   Georjean Darice HERO, MD  hydrALAZINE  (APRESOLINE ) 25 MG tablet Take 1 tablet (25 mg total) by mouth every 8 (eight) hours. Patient taking differently: Take 25 mg by mouth every 8 (eight) hours. Hold if systolic BP <110 87/77/77   Johnson, Clanford L, MD  HYDROcodone-acetaminophen  (NORCO/VICODIN) 5-325 MG tablet Take 1 tablet by mouth every 6 (six) hours as needed (post op pain).    [provider]  insulin  glargine-yfgn (SEMGLEE ) 100 UNIT/ML injection Inject 0.7 mLs (70 Units total) into the skin at bedtime. 10/25/24   Therisa Benton PARAS, NP  ipratropium-albuterol  (DUONEB) 0.5-2.5 (3) MG/3ML SOLN Take 3 mLs by nebulization every 4 (four) hours as needed (shortness of breath/wheezing).    [provider]  levothyroxine  (SYNTHROID ) 88 MCG tablet Take 88 mcg by mouth daily before breakfast.    [provider]  Menthol-Zinc  Oxide (CALMOSEPTINE) 0.44-20.6 % OINT Apply 1 Application  topically daily as needed (preservation/protection after incontinent care).    [provider]  Multiple Vitamins-Minerals (MULTIVITAMIN WITH MINERALS) tablet Take 1 tablet by mouth daily.    [provider]  MYRBETRIQ  50 MG TB24 tablet Take 1 tablet (50 mg total) by mouth daily. 08/04/23   Gerldine Lauraine BROCKS, FNP  NOVOLOG  100 UNIT/ML injection Inject 14-20 Units into the skin 3 (three) times daily with meals. 90-150= 14 units; 151-200= 15 units; 201-250= 16 units; 251-300= 17 units; 301-350= 18 units; 351-400= 19 units; above 400 = 20 units 10/25/24   Reardon, Benton PARAS, NP  Nutritional Supplements (BOOST GLUCOSE CONTROL) LIQD Take 240 mLs by mouth 2 (two) times daily with a meal.    [provider]  omeprazole (PRILOSEC) 20 MG capsule Take 20 mg by mouth daily.    [provider]  ondansetron  (ZOFRAN ) 8 MG tablet Take 8 mg by mouth 2 (two) times daily. 12/09/23   [provider]  OXYGEN  Inhale 2 L into the lungs continuous.    [provider]  potassium chloride  SA (K-DUR) 20 MEQ tablet Take 20 mEq by mouth daily.    [provider]  saccharomyces boulardii (FLORASTOR) 250 MG capsule Take 250 mg by mouth 2 (two) times daily.    [provider]  sertraline  (ZOLOFT ) 100 MG tablet Take 1 tablet (100 mg total) by mouth daily. 10/18/24   Evonnie Lenis, MD  sodium chloride  (OCEAN) 0.65 % SOLN nasal spray Place 1 spray into both nostrils every 2 (two) hours as needed (dry nasal passages).    [provider]  torsemide  40 MG TABS Take 40 mg by mouth 2 (two) times daily. 10/18/24   Evonnie Lenis, MD  trimethoprim  (TRIMPEX ) 100 MG tablet Take 1 tablet (100 mg total) by mouth daily. 09/09/23   Gerldine Lauraine BROCKS, FNP    Physical Exam: Vitals:   12/16/24 1034 12/16/24 1037  BP:  (!) 164/72  Pulse:  68  Resp:  18  Temp:  98 F (36.7 C)  TempSrc:  Oral  SpO2:  96%  Weight: 123.8 kg   Height: 5' 1 (1.549 m)    GENERAL:  A&O x 3, NAD,  well developed, cooperative, follows commands HEENT: [place]/AT, No thrush, No icterus, No oral ulcers Neck:  No neck mass, No meningismus, soft, supple CV: RRR, no S3, no S4, no rub, no JVD Lungs:  CTA, no wheeze, no rhonchi, good air movement Abd: soft/NT +BS, nondistended Ext: trace LE edema, no lymphangitis, no cyanosis, no rashes Neuro:  CN II-XII intact, strength 4/5  in RUE, RLE, strength 4/5 LUE, LLE; sensation intact bilateral; no dysmetria; babinski equivocal  Data Reviewed: Data reviewed above in the history  Assessment and Plan: Hematochezia - Baseline hemoglobin 11-12 - GI consulted - Plans for colonoscopy 12/17/2024 - Clear liquid diet  Chronic HFpEF - Appears clinically euvolemic - Last weight at cardiology office 11/14/2024 = 291.6 pounds - Continue home dose torsemide  - 10/14/2024 echo EF 60 to 65%, no WMA, normal RVF, trivial MR/TR  - Daily weights  CKD stage IV - Baseline creatinine 1.9-2.1 - Monitor BMP  Respiratory failure with hypoxia and hypercarbia - Patient is chronically on 2-3 L  Essential hypertension - Continue amlodipine  and hydralazine    Morbid obesity - BMI 54.82 - Lifestyle modification   Hypothyroidism - 10/14/24 TSH 3.420 - Continue Synthroid    Diabetes mellitus type 2 - 10/14/2024 hemoglobin A1c 7.2 - Started on Semglee  daily - NovoLog  sliding scale   Depression - Continue sertraline    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 Davonna -surveillance CTs and CEAs- -08/15/23 CEA 1.8 -12/12/24 CEA 3.5   Mixed hyperlipidemia -Continue statin   Advance Care Planning: FULL  Consults: GI  Family Communication: husband 1/11  Severity of Illness: The appropriate patient status for this patient is OBSERVATION. Observation status is judged to be reasonable and necessary in order to provide the required intensity of service to ensure the patient's safety. The patient's presenting  symptoms, physical exam findings, and initial radiographic and laboratory data in the context of their medical condition is felt to place them at decreased risk for further clinical deterioration. Furthermore, it is anticipated that the patient will be medically stable for discharge from the hospital within 2 midnights of admission.   Author: Alm Schneider, MD 12/16/2024 1:09 PM  For on call review [email or web address].     [1]  Allergies Allergen Reactions   Carvedilol  Palpitations   Sulfa Antibiotics Swelling    Whole face swells   Trulicity [Dulaglutide] Diarrhea   Benicar [Olmesartan] Swelling   Ceftriaxone  Hives and Rash   Codeine Other (See Comments)    Confusion    "

## 2024-12-16 NOTE — ED Triage Notes (Signed)
 To ed from [facility] via [facility] c/o rectal bleeding bright red in color with clots. Hx of hemmorrhoids and rectal cx.  C/o Recent constipation.

## 2024-12-16 NOTE — ED Provider Notes (Signed)
 " [place] EMERGENCY DEPARTMENT AT [facility] Provider Note   CSN: [id number] Arrival date & time: 12/16/24  1027     Patient presents with: Rectal Bleeding   [name] is a 64 y.o. female.   Pt is a 64 yo female with pmhx significant for dm, htn, asthma, depression, copd, obesity, hypothyroidism, hld, sleep apnea, and colon cancer.  Pt had surgery to remove the colon cancer in 2023 and did not require chemo or radiation.  She's been observed and there has been no recurrence.  Most recent CT was 1/7.  Pt has been constipated, but had a normal bm yesterday.  She woke up this am and had rectal bleeding.  She reports passing clots.  She is not on blood thinners.         Prior to Admission medications  Medication Sig Start Date End Date Taking? Authorizing Provider  albuterol  (VENTOLIN  HFA) 108 (90 Base) MCG/ACT inhaler Inhale 2 puffs into the lungs every 6 (six) hours as needed for wheezing or shortness of breath.    [provider]  alum & mag hydroxide-simeth (MAALOX/MYLANTA) 200-200-20 MG/5ML suspension Take 30 mLs by mouth every 2 (two) hours as needed for indigestion or heartburn. Do not exceed 4 doses in 24 hours    [provider]  Amino Acids-Protein Hydrolys (FEEDING SUPPLEMENT, PRO-STAT 64,) LIQD Take 60 mLs by mouth daily.    [provider]  amLODipine  (NORVASC ) 10 MG tablet Take 10 mg by mouth daily. 12/30/23   [provider]  ascorbic acid  (VITAMIN C ) 500 MG tablet Take 500 mg by mouth 2 (two) times daily.    [provider]  aspirin  EC 81 MG tablet Take 81 mg by mouth daily. Swallow whole.    [provider]  atorvastatin  (LIPITOR) 10 MG tablet Take 10 mg by mouth at bedtime. 09/29/21   [provider]  augmented betamethasone dipropionate (DIPROLENE-AF) 0.05 % cream Apply 1 application  topically 2 (two) times daily. Apply to trunk, buttocks, legs for Psoriasis    [provider]   BD AUTOSHIELD DUO 30G X 5 MM MISC  12/31/23   [provider]  Biotin  10 MG TABS Take 10 mg by mouth daily.    [provider]  carboxymethylcellulose 1 % ophthalmic solution Place 2 drops into both eyes every 6 (six) hours as needed (eye dryness/irritation).    [provider]  cetirizine (ZYRTEC) 10 MG tablet Take 10 mg by mouth daily as needed for allergies.    [provider]  clotrimazole-betamethasone (LOTRISONE) cream Apply 1 Application topically 2 (two) times a week. Apply on Tuesday and Friday and as needed for management of chronic fungal rash after shower 03/29/24   [provider]  Cranberry 300 MG tablet Take 300 mg by mouth 2 (two) times daily.    [provider]  docusate sodium  (COLACE) 100 MG capsule Take 100 mg by mouth daily.    [provider]  Emollient (EUCERIN ADVANCED REPAIR) CREA Apply 1 Application topically every 12 (twelve) hours as needed (hydration of skin). Apply to bilateral lower extremities    [provider]  estradiol  (ESTRACE ) 0.1 MG/GM vaginal cream Discard plastic applicator. Insert a blueberry size amount (approximately 1 gram) of cream on fingertip inside vagina at bedtime every night for 1 week then every other night routinely (for long term use). 09/09/23   Gerldine Lauraine BROCKS, FNP  fluticasone -salmeterol (ADVAIR) 250-50 MCG/ACT AEPB Inhale 1 puff into  the lungs 2 (two) times daily. 12/24/23   [provider]  gabapentin  (NEURONTIN ) 300 MG capsule Take 1 capsule in AM, 1 capsule in PM Patient taking differently: Take 300 mg by mouth 2 (two) times daily. Take 1 capsule in AM, 1 capsule in PM 09/30/23   Georjean Darice HERO, MD  hydrALAZINE  (APRESOLINE ) 25 MG tablet Take 1 tablet (25 mg total) by mouth every 8 (eight) hours. Patient taking differently: Take 25 mg by mouth every 8 (eight) hours. Hold if systolic BP <110 87/77/77   Johnson, Clanford L, MD  HYDROcodone-acetaminophen   (NORCO/VICODIN) 5-325 MG tablet Take 1 tablet by mouth every 6 (six) hours as needed (post op pain).    [provider]  insulin  glargine-yfgn (SEMGLEE ) 100 UNIT/ML injection Inject 0.7 mLs (70 Units total) into the skin at bedtime. 10/25/24   Therisa Benton PARAS, NP  ipratropium-albuterol  (DUONEB) 0.5-2.5 (3) MG/3ML SOLN Take 3 mLs by nebulization every 4 (four) hours as needed (shortness of breath/wheezing).    [provider]  levothyroxine  (SYNTHROID ) 88 MCG tablet Take 88 mcg by mouth daily before breakfast.    [provider]  Menthol-Zinc  Oxide (CALMOSEPTINE) 0.44-20.6 % OINT Apply 1 Application topically daily as needed (preservation/protection after incontinent care).    [provider]  Multiple Vitamins-Minerals (MULTIVITAMIN WITH MINERALS) tablet Take 1 tablet by mouth daily.    [provider]  MYRBETRIQ  50 MG TB24 tablet Take 1 tablet (50 mg total) by mouth daily. 08/04/23   Gerldine Lauraine BROCKS, FNP  NOVOLOG  100 UNIT/ML injection Inject 14-20 Units into the skin 3 (three) times daily with meals. 90-150= 14 units; 151-200= 15 units; 201-250= 16 units; 251-300= 17 units; 301-350= 18 units; 351-400= 19 units; above 400 = 20 units 10/25/24   Reardon, Benton PARAS, NP  Nutritional Supplements (BOOST GLUCOSE CONTROL) LIQD Take 240 mLs by mouth 2 (two) times daily with a meal.    [provider]  omeprazole (PRILOSEC) 20 MG capsule Take 20 mg by mouth daily.    [provider]  ondansetron  (ZOFRAN ) 8 MG tablet Take 8 mg by mouth 2 (two) times daily. 12/09/23   [provider]  OXYGEN  Inhale 2 L into the lungs continuous.    [provider]  potassium chloride  SA (K-DUR) 20 MEQ tablet Take 20 mEq by mouth daily.    [provider]  saccharomyces boulardii (FLORASTOR) 250 MG capsule Take 250 mg by mouth 2 (two) times daily.    [provider]  sertraline  (ZOLOFT ) 100 MG tablet Take 1 tablet (100 mg total) by  mouth daily. 10/18/24   Evonnie Lenis, MD  sodium chloride  (OCEAN) 0.65 % SOLN nasal spray Place 1 spray into both nostrils every 2 (two) hours as needed (dry nasal passages).    [provider]  torsemide  40 MG TABS Take 40 mg by mouth 2 (two) times daily. 10/18/24   Evonnie Lenis, MD  trimethoprim  (TRIMPEX ) 100 MG tablet Take 1 tablet (100 mg total) by mouth daily. 09/09/23   Gerldine Lauraine BROCKS, FNP    Allergies: Carvedilol , Sulfa antibiotics, Trulicity [dulaglutide], Benicar [olmesartan], Ceftriaxone , and Codeine    Review of Systems  Gastrointestinal:  Positive for blood in stool.  All other systems reviewed and are negative.   Updated Vital Signs BP (!) 164/72 (BP Location: Left Arm)   Pulse 68   Temp 98 F (36.7 C) (Oral)   Resp 18   Ht 5' 1 (1.549 m)   Wt 123.8 kg  LMP 08/06/2012   SpO2 96%   BMI 51.58 kg/m   Physical Exam Vitals and nursing note reviewed.  Constitutional:      Appearance: Normal appearance. She is obese.  HENT:     Head: Normocephalic and atraumatic.     Right Ear: External ear normal.     Left Ear: External ear normal.     Nose: Nose normal.     Mouth/Throat:     Mouth: Mucous membranes are moist.     Pharynx: Oropharynx is clear.  Eyes:     Extraocular Movements: Extraocular movements intact.     Conjunctiva/sclera: Conjunctivae normal.     Pupils: Pupils are equal, round, and reactive to light.  Cardiovascular:     Rate and Rhythm: Normal rate and regular rhythm.     Pulses: Normal pulses.     Heart sounds: Normal heart sounds.  Pulmonary:     Effort: Pulmonary effort is normal.     Breath sounds: Normal breath sounds.  Genitourinary:    Rectum: Guaiac result positive.     Comments: BRBPR External hemorrhoids which are not bleeding. Musculoskeletal:     Cervical back: Normal range of motion and neck supple.  Skin:    General: Skin is warm.     Capillary Refill: Capillary refill takes less than 2 seconds.  Neurological:      General: No focal deficit present.     Mental Status: She is alert and oriented to person, place, and time.  Psychiatric:        Mood and Affect: Mood normal.        Behavior: Behavior normal.     (all labs ordered are listed, but only abnormal results are displayed) Labs Reviewed  COMPREHENSIVE METABOLIC PANEL WITH GFR - Abnormal; Notable for the following components:      Result Value   CO2 36 (*)    BUN 33 (*)    Creatinine, Ser 2.06 (*)    GFR, Estimated 26 (*)    All other components within normal limits  CBC WITH DIFFERENTIAL/PLATELET - Abnormal; Notable for the following components:   Eosinophils Absolute 0.6 (*)    All other components within normal limits  POC OCCULT BLOOD, ED - Abnormal; Notable for the following components:   Fecal Occult Bld POSITIVE (*)    All other components within normal limits  PROTIME-INR  TYPE AND SCREEN    EKG: EKG Interpretation Date/Time:  Sunday December 16 2024 11:44:22 EST Ventricular Rate:  66 PR Interval:  137 QRS Duration:  133 QT Interval:  485 QTC Calculation: 509 R Axis:   -74  Text Interpretation: Sinus rhythm Left bundle branch block No significant change since last tracing Confirmed by Dean Clarity [phone]) on 12/16/2024 12:26:26 PM  Radiology: [facility] Chest Port 1 View Result Date: 12/16/2024 CLINICAL DATA:  Rectal bleeding. EXAM: PORTABLE CHEST 1 VIEW COMPARISON:  10/14/2024 and CT chest 06/27/2023. FINDINGS: View is apical lordotic. Trachea is midline. Heart is enlarged. Lungs are clear. No pleural fluid. IMPRESSION: No acute findings. Electronically Signed   By: Newell Eke M.D.   On: 12/16/2024 11:19     Procedures   Medications Ordered in the ED  sodium chloride  0.9 % bolus 500 mL (has no administration in time range)                                    Medical Decision Making Amount  and/or Complexity of Data Reviewed Labs: ordered. Radiology: ordered.  Risk Decision regarding hospitalization.   This  patient presents to the ED for concern of rectal bleeding, this involves an extensive number of treatment options, and is a complaint that carries with it a high risk of complications and morbidity.  The differential diagnosis includes hemorrhoids, diverticular bleed   Co morbidities that complicate the patient evaluation  dm, htn, asthma, depression, copd, obesity, hypothyroidism, hld, sleep apnea, and colon cancer.   Additional history obtained:  Additional history obtained from epic chart review External records from outside source obtained and reviewed including EMS report   Lab Tests:  I Ordered, and personally interpreted labs.  The pertinent results include:  cbc nl, cmp with cr 2.06 (chronic)   Imaging Studies ordered:  I ordered imaging studies including cxr  I independently visualized and interpreted imaging which showed No acute findings.  I agree with the radiologist interpretation   Cardiac Monitoring:  The patient was maintained on a cardiac monitor.  I personally viewed and interpreted the cardiac monitored which showed an underlying rhythm of: nsr   Medicines ordered and prescription drug management:  I ordered medication including ivfs  for sx  Reevaluation of the patient after these medicines showed that the patient improved I have reviewed the patients home medicines and have made adjustments as needed   Test Considered:  ct   Consultations Obtained:  I requested consultation with the gastroenterologist (Dr. Cindie),  and discussed lab and imaging findings as well as pertinent plan - he recommends adm overnight for colon prep and then colonoscopy tomorrow. Pt d/w Dr. Evonnie (triad) for admission.   Problem List / ED Course:  BRBPR:  vitals remain stable.  Hgb stable.  Due to significant bleeding and hx colon cancer, GI recommends colonoscopy tomorrow.  We will keep her overnight to monitor the bleeding and h/h.   Reevaluation:  After the  interventions noted above, I reevaluated the patient and found that they have :improved   Social Determinants of Health:  Lives at home   Dispostion:  After consideration of the diagnostic results and the patients response to treatment, I feel that the patent would benefit from admission.       Final diagnoses:  Rectal bleeding  CKD (chronic kidney disease) stage 4, GFR 15-29 ml/min (HCC)  History of colon cancer    ED Discharge Orders     None          Dean Clarity, MD 12/16/24 1302  "

## 2024-12-17 ENCOUNTER — Encounter
Disposition: A | Discharge status: Skilled Nursing Facility | Payer: Self-pay | Source: Skilled Nursing Facility | Attending: Emergency Medicine

## 2024-12-17 DIAGNOSIS — I5032 Chronic diastolic (congestive) heart failure: Secondary | ICD-10-CM

## 2024-12-17 DIAGNOSIS — N184 Chronic kidney disease, stage 4 (severe): Secondary | ICD-10-CM | POA: Diagnosis not present

## 2024-12-17 DIAGNOSIS — K635 Polyp of colon: Secondary | ICD-10-CM | POA: Diagnosis not present

## 2024-12-17 DIAGNOSIS — K921 Melena: Secondary | ICD-10-CM | POA: Diagnosis not present

## 2024-12-17 DIAGNOSIS — E1165 Type 2 diabetes mellitus with hyperglycemia: Secondary | ICD-10-CM | POA: Diagnosis not present

## 2024-12-17 LAB — CBC
HCT: 33.7 % — ABNORMAL LOW (ref 36.0–46.0)
Hemoglobin: 10.6 g/dL — ABNORMAL LOW (ref 12.0–15.0)
MCH: 25.7 pg — ABNORMAL LOW (ref 26.0–34.0)
MCHC: 31.5 g/dL (ref 30.0–36.0)
MCV: 81.6 fL (ref 80.0–100.0)
Platelets: 296 K/uL (ref 150–400)
RBC: 4.13 MIL/uL (ref 3.87–5.11)
RDW: 15.5 % (ref 11.5–15.5)
WBC: 9.2 K/uL (ref 4.0–10.5)
nRBC: 0 % (ref 0.0–0.2)

## 2024-12-17 LAB — GLUCOSE, CAPILLARY
Glucose-Capillary: 206 mg/dL — ABNORMAL HIGH (ref 70–99)
Glucose-Capillary: 125 mg/dL — ABNORMAL HIGH (ref 70–99)
Glucose-Capillary: 253 mg/dL — ABNORMAL HIGH (ref 70–99)
Glucose-Capillary: 207 mg/dL — ABNORMAL HIGH (ref 70–99)
Glucose-Capillary: 266 mg/dL — ABNORMAL HIGH (ref 70–99)

## 2024-12-17 LAB — BASIC METABOLIC PANEL WITH GFR
Anion gap: 5 (ref 5–15)
BUN: 27 mg/dL — ABNORMAL HIGH (ref 8–23)
CO2: 37 mmol/L — ABNORMAL HIGH (ref 22–32)
Calcium: 9.3 mg/dL (ref 8.9–10.3)
Chloride: 100 mmol/L (ref 98–111)
Creatinine, Ser: 1.96 mg/dL — ABNORMAL HIGH (ref 0.44–1.00)
GFR, Estimated: 28 mL/min — ABNORMAL LOW
Glucose, Bld: 194 mg/dL — ABNORMAL HIGH (ref 70–99)
Potassium: 3.8 mmol/L (ref 3.5–5.1)
Sodium: 142 mmol/L (ref 135–145)

## 2024-12-17 SURGERY — COLONOSCOPY
Anesthesia: Monitor Anesthesia Care

## 2024-12-17 MED ORDER — POLYETHYLENE GLYCOL 3350 17 G PO PACK
17.0000 g | PACK | ORAL | Status: AC
  Administered 2024-12-17 (×4): 17 g via ORAL
  Filled 2024-12-17 (×3): qty 1

## 2024-12-17 MED ORDER — BISACODYL 5 MG PO TBEC
10.0000 mg | DELAYED_RELEASE_TABLET | ORAL | Status: AC
  Administered 2024-12-17 (×2): 10 mg via ORAL
  Filled 2024-12-17 (×2): qty 2

## 2024-12-17 NOTE — [place] FL2 (Signed)
 " [place]  MEDICAID FL2 LEVEL OF CARE FORM     IDENTIFICATION  Patient Name: [name] Birthdate: 1961-01-30 Sex: female Admission Date (Current Location): 12/16/2024  [place] and [place] Number:  [place] [id number] R Facility and Address:  [facility],  [address] [place] [address]      Provider Number: [id number]  Attending Physician Name and Address:  Evonnie Lenis, MD  Relative Name and Phone Number:       Current Level of Care: Hospital Recommended Level of Care: Skilled Nursing Facility Prior Approval Number:    Date Approved/Denied:   PASRR Number:    Discharge Plan: SNF    Current Diagnoses: Patient Active Problem List   Diagnosis Date Noted   Hematochezia 12/16/2024   CKD (chronic kidney disease) stage 4, GFR 15-29 ml/min (HCC) 12/16/2024   COPD with acute exacerbation (HCC) 10/14/2024   Lobar pneumonia 11/16/2023   Acute on chronic respiratory failure with hypoxia and hypercapnia (HCC) 11/16/2023   Encephalopathy acute 11/12/2023   Acquired renal cyst of right kidney 08/04/2023   Mixed stress and urge incontinence 08/04/2023   Ambulatory dysfunction 08/04/2023   Urinary urgency 08/04/2023   Urge incontinence 08/04/2023   Urinary frequency 08/04/2023   Cognitive communication deficit 08/04/2023   Overactive bladder 08/02/2023   Recurrent UTI 08/02/2023   Left carpal tunnel syndrome 05/27/2023   Right carpal tunnel syndrome 05/27/2023   Polyneuropathy associated with underlying disease 09/20/2022   Sensorineural hearing loss (SNHL) of both ears 09/20/2022   Colon cancer (HCC) 08/11/2022   Cancer of sigmoid colon (HCC) 05/12/2022   Iron deficiency anemia    Pressure injury of skin 11/24/2021   Chronic venous stasis 11/22/2021   Leukocytosis 11/22/2021   Thrombocytosis 11/22/2021   Hyperglycemia due to diabetes mellitus (HCC) 11/22/2021   Lactic acidosis 11/22/2021   Hypoalbuminemia due to protein-calorie malnutrition  11/22/2021   Elevated liver enzymes 11/22/2021   Mixed hyperlipidemia 11/22/2021   GERD (gastroesophageal reflux disease) 11/22/2021   Chronic diastolic heart failure (HCC) 11/22/2021   Diabetic foot infection (HCC) 11/22/2021   Uncontrolled type 2 diabetes mellitus with hyperglycemia, with long-term current use of insulin  (HCC) 11/22/2021   Lower extremity cellulitis 11/21/2021   Class 3 severe obesity with serious comorbidity and body mass index (BMI) of 50.0 to 59.9 in adult (HCC) 07/10/2021   Morbid obesity with BMI of 50.0-59.9, adult (HCC) 01/29/2020   OSA (obstructive sleep apnea) 12/21/2019   Asthma 12/21/2019   Current moderate episode of major depressive disorder (HCC) 01/16/2019   Stress incontinence of urine 01/16/2019   Irritable bowel syndrome with diarrhea 02/21/2018   Uterine prolapse 08/09/2017   Acquired hypothyroidism 09/27/2016   Anxiety, generalized 12/03/2015   Hypertension associated with diabetes (HCC) 08/26/2015   Diastolic dysfunction 05/15/2015   Prolonged QT interval 05/14/2015   Palpitations 07/23/2014   Generalized weakness 07/23/2014   Essential hypertension 07/22/2014   COPD (chronic obstructive pulmonary disease) (HCC) 07/22/2014   Depression 07/22/2014    Orientation RESPIRATION BLADDER Height & Weight     Self, Time, Situation, Place  O2, Normal (Sometimes requires O2) Incontinent Weight: 273 lb (123.8 kg) Height:  5' 1 (154.9 cm)  BEHAVIORAL SYMPTOMS/MOOD NEUROLOGICAL BOWEL NUTRITION STATUS      Incontinent Diet (See d/c summary)  AMBULATORY STATUS COMMUNICATION OF NEEDS Skin   Extensive Assist Verbally Skin abrasions, Bruising, Other (Comment) (Redness to breasts, abdomen, legs, thighs, and groin)  Personal Care Assistance Level of Assistance  Bathing, Feeding, Dressing Bathing Assistance: Limited assistance Feeding assistance: Limited assistance Dressing Assistance: Limited assistance     Functional  Limitations Info  Sight, Hearing, Speech Sight Info: Impaired Hearing Info: Adequate Speech Info: Adequate    SPECIAL CARE FACTORS FREQUENCY                       Contractures      Additional Factors Info  Code Status, Allergies, Psychotropic, Insulin  Sliding Scale Code Status Info: Full Allergies Info: Carvedilol   Sulfa Antibiotics  Trulicity (Dulaglutide)  Benicar (Olmesartan)  Ceftriaxone   Codeine Psychotropic Info: Zoloft          Current Medications (12/17/2024):  This is the current hospital active medication list Current Facility-Administered Medications  Medication Dose Route Frequency Provider Last Rate Last Admin   acetaminophen  (TYLENOL ) tablet 650 mg  650 mg Oral Q6H PRN Tat, Alm, MD       Or   acetaminophen  (TYLENOL ) suppository 650 mg  650 mg Rectal Q6H PRN Tat, Alm, MD       amLODipine  (NORVASC ) tablet 10 mg  10 mg Oral Daily Tat, David, MD       ascorbic acid  (VITAMIN C ) tablet 500 mg  500 mg Oral BID Tat, Alm, MD   500 mg at 12/16/24 2135   atorvastatin  (LIPITOR) tablet 10 mg  10 mg Oral QHS Tat, David, MD   10 mg at 12/16/24 2135   fluticasone  furoate-vilanterol (BREO ELLIPTA ) 200-25 MCG/ACT 1 puff  1 puff Inhalation Daily Tat, David, MD       gabapentin  (NEURONTIN ) capsule 300 mg  300 mg Oral BID Evonnie Alm, MD   300 mg at 12/16/24 2134   hydrALAZINE  (APRESOLINE ) tablet 25 mg  25 mg Oral Q8H Tat, David, MD   25 mg at 12/16/24 2135   hydrocerin (EUCERIN) cream   Topical BID Daniels, James K, NP       insulin  aspart (novoLOG ) injection 0-15 Units  0-15 Units Subcutaneous Q4H Daniels, James K, NP   5 Units at 12/17/24 9587   insulin  glargine-yfgn (SEMGLEE ) injection 10 Units  10 Units Subcutaneous Daily Tat, Alm, MD       levothyroxine  (SYNTHROID ) tablet 88 mcg  88 mcg Oral QAC breakfast Tat, Alm, MD       mirabegron  ER (MYRBETRIQ ) tablet 50 mg  50 mg Oral Daily Tat, David, MD       multivitamin with minerals tablet 1 tablet  1 tablet Oral Daily  Tat, Alm, MD       pantoprazole  (PROTONIX ) EC tablet 40 mg  40 mg Oral Daily Tat, David, MD   40 mg at 12/16/24 1733   potassium chloride  SA (KLOR-CON  M) CR tablet 20 mEq  20 mEq Oral Daily Tat, David, MD       prochlorperazine  (COMPAZINE ) injection 10 mg  10 mg Intravenous Q6H PRN Tat, Alm, MD       sertraline  (ZOLOFT ) tablet 100 mg  100 mg Oral Daily Tat, David, MD       torsemide  (DEMADEX ) tablet 40 mg  40 mg Oral BID Tat, David, MD       trimethoprim  (TRIMPEX ) tablet 100 mg  100 mg Oral Daily Tat, Alm, MD         Discharge Medications: Please see discharge summary for a list of discharge medications.  Relevant Imaging Results:  Relevant Lab Results:   Additional Information    [place] Saddie Kim, LCSW     "

## 2024-12-17 NOTE — Plan of Care (Signed)

## 2024-12-17 NOTE — Care Management Obs Status (Signed)
 MEDICARE OBSERVATION STATUS NOTIFICATION   Patient Details  Name: GALILEE PIERRON MRN: [id number] Date of Birth: 09-21-1961   Medicare Observation Status Notification Given:  Yes    Du[place]aine LITTIE Ada 12/17/2024, 2:59 PM

## 2024-12-17 NOTE — Progress Notes (Signed)
" ° ° °  PROCEDURAL EXPEDITER PROGRESS NOTE  Patient Name: [name]  DOB:Sep 18, 1961 Date of Admission: 12/16/2024  Date of Assessment:12/17/2024   -------------------------------------------------------------------------------------------------------------------   Brief clinical summary: Pt having a colonscopy today   Orders in place:  Yes   Communication with surgical team if no orders: n/a  Labs, test, and orders reviewed: Y  Requires surgical clearance:  No  What type of clearance: n/a  Clearance received: n/a  Barriers noted:n/a   Intervention provided by [facility] team: n/a  Barrier resolved:  not applicable   -------------------------------------------------------------------------------------------------------------------  [name], [name] Please contact us  directly via secure chat (search for [facility]) or by calling us  at [phone] [facility]).  "

## 2024-12-17 NOTE — Consult Note (Signed)
 "  Gastroenterology Consult   Referring Provider: [facility] Primary Care Physician:  Patient, No Pcp Per Primary Gastroenterologist:  Dr. Eartha previously  Patient ID: [name]; [id number]; 20-Feb-1961   Admit date: 12/16/2024  LOS: 0 days   Date of Consultation: 12/17/2024  Reason for Consultation:  GI bleeding  History of Present Illness   [name] is a 64 y.o. year old female presenting to the [facility] ED yesterday from [facility] due to acute onset of rectal bleeding with clots. 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 She is [facility] with serial imaging and follow-up every 6 months. Additional history includes OSA, DM, HTN, depression, COPD, CHF, Barrett's esophagus with EGD surveillance due in 2028.   In the ED: Hgb 12.1, appears previously has been 10/11 range. Hgb 10.6 today.   She had CT abd/pelvis without contrast Dec 12, 2024 without metastatic disease identified.   She has prepped overnight with plans for colonoscopy today. However, she has murky output, still with soft pieces of stool. Not clear. She drank entire prep.   Notes for the past 2 months has had more straining, harder stool. BM daily but small amounts and has hard stool at times. Sometimes better than others. Noted rectal bleeding yesterday. States was a large amount that was dark red with clots. No rectal bleeding today.   No abdominal pain. Good appetite. No unexplained weight loss. No dysphagia.   [place] for past 3 years. Uses walker for ambulation. Oxygen  at nighttime, none during the day.   No anticoagulation.    EGD May 2023: multiple gastric polyps s/p resection and retrieval, clip placed, erythematous mucosa s/p biopsy, normal duodenum. Path: hyperplastic polyps, no H .pylori, reactive gastropathy, +Barrett's. 5 year surveillance recommended.    Colonoscopy May  2023:  Two 6 to 8 mm polyps in the transverse colon and  in the ascending colon, removed with a cold snare. Two 6-12 mm polyps in descending colon, likely malignant partially obstructing tumor at 12 cm proximal to the anus s/p biopsy and injected.                             Past Medical History:  Diagnosis Date   Anemia    Arthritis    Asthma    Cancer of sigmoid (HCC)    COPD (chronic obstructive pulmonary disease) (HCC)    Depression    Diabetes mellitus    Diastolic dysfunction 05/15/2015   Dyspnea    Generalized weakness    Hyperlipidemia    Hypertension    Hypothyroidism    Obesity    Palpitations    Pneumonia    PONV (postoperative nausea and vomiting)    Prolonged QT interval 05/14/2015   Sleep apnea     Past Surgical History:  Procedure Laterality Date   BIOPSY  04/06/2022   Procedure: BIOPSY;  Surgeon: Eartha Angelia Sieving, MD;  Location: [facility];  Service: Gastroenterology;;   CATARACT EXTRACTION     CESAREAN SECTION     CHOLECYSTECTOMY     COLONOSCOPY WITH PROPOFOL  N/A 04/06/2022   Procedure: COLONOSCOPY WITH PROPOFOL ;  Surgeon: Eartha Angelia Sieving, MD;  Location: [facility];  Service: Gastroenterology;  Laterality: N/A;  805 ASA 2 patient in [facility]   CYSTOSCOPY WITH INJECTION N/A 02/16/2024   Procedure: CYSTOSCOPY WITH  INJECTION- Bladder botox  100 units;  Surgeon: Sherrilee Belvie CROME, MD;  Location: [facility];  Service: Urology;  Laterality: N/A;   ESOPHAGOGASTRODUODENOSCOPY (EGD) WITH PROPOFOL  N/A 04/06/2022   Procedure: ESOPHAGOGASTRODUODENOSCOPY (EGD) WITH PROPOFOL ;  Surgeon: Eartha Angelia Sieving, MD;  Location: [facility];  Service: Gastroenterology;  Laterality: N/A;   HEMOSTASIS CLIP PLACEMENT  04/06/2022   Procedure: HEMOSTASIS CLIP PLACEMENT;  Surgeon: Eartha Angelia Sieving, MD;  Location: [facility];  Service: Gastroenterology;;   POLYPECTOMY  04/06/2022   Procedure: POLYPECTOMY;   Surgeon: Eartha Angelia Sieving, MD;  [facility]: [facility];  Service: Gastroenterology;;   SUBMUCOSAL TATTOO INJECTION  04/06/2022   Procedure: SUBMUCOSAL TATTOO INJECTION;  Surgeon: Eartha Angelia Sieving, MD;  Location: [facility];  Service: Gastroenterology;;    Prior to Admission medications  Medication Sig Start Date End Date Taking? Authorizing Provider  albuterol  (VENTOLIN  HFA) 108 (90 Base) MCG/ACT inhaler Inhale 2 puffs into the lungs every 6 (six) hours as needed for wheezing or shortness of breath.   Yes [provider]  alum & mag hydroxide-simeth (MAALOX/MYLANTA) 200-200-20 MG/5ML suspension Take 30 mLs by mouth every 2 (two) hours as needed for indigestion or heartburn. Do not exceed 4 doses in 24 hours   Yes [provider]  Amino Acids-Protein Hydrolys (FEEDING SUPPLEMENT, PRO-STAT 64,) LIQD Take 60 mLs by mouth daily.   Yes [provider]  amLODipine  (NORVASC ) 10 MG tablet Take 10 mg by mouth daily. 12/30/23  Yes [provider]  ascorbic acid  (VITAMIN C ) 500 MG tablet Take 500 mg by mouth 2 (two) times daily.   Yes [provider]  aspirin  EC 81 MG tablet Take 81 mg by mouth daily. Swallow whole.   Yes [provider]  atorvastatin  (LIPITOR) 10 MG tablet Take 10 mg by mouth at bedtime. 09/29/21  Yes [provider]  augmented betamethasone dipropionate (DIPROLENE-AF) 0.05 % cream Apply 1 application  topically 2 (two) times daily. Apply to trunk, buttocks, legs for Psoriasis   Yes [provider]  Biotin  10 MG TABS Take 10 mg by mouth daily.   Yes [provider]  carboxymethylcellulose 1 % ophthalmic solution Place 2 drops into both eyes every 6 (six) hours as needed (eye dryness/irritation).   Yes [provider]  cetirizine (ZYRTEC) 10 MG tablet Take 10 mg by mouth daily as needed for allergies.   Yes [provider]  clotrimazole-betamethasone (LOTRISONE) cream Apply 1  Application topically 2 (two) times a week. Apply on Tuesday and Friday and as needed for management of chronic fungal rash after shower 03/29/24  Yes [provider]  Cranberry 300 MG tablet Take 300 mg by mouth 2 (two) times daily.   Yes [provider]  docusate sodium  (COLACE) 100 MG capsule Take 100 mg by mouth daily.   Yes [provider]  fluticasone -salmeterol (ADVAIR) 250-50 MCG/ACT AEPB Inhale 1 puff into the lungs 2 (two) times daily. 12/24/23  Yes [provider]  gabapentin  (NEURONTIN ) 300 MG capsule Take 1 capsule in AM, 1 capsule in PM Patient taking differently: Take 300 mg by mouth 2 (two) times daily. Take 1 capsule in AM, 1 capsule in PM 09/30/23  Yes Georjean Darice HERO, MD  hydrALAZINE  (APRESOLINE ) 25 MG tablet Take 1 tablet (25 mg total) by mouth every 8 (eight) hours. Patient taking differently: Take 25 mg by mouth every 8 (eight) hours. Hold if systolic BP <110 87/77/77  Yes Johnson, Afton CROME, MD  insulin  glargine-yfgn (  SEMGLEE ) 100 UNIT/ML injection Inject 0.7 mLs (70 Units total) into the skin at bedtime. 10/25/24  Yes Reardon, Benton PARAS, NP  ipratropium-albuterol  (DUONEB) 0.5-2.5 (3) MG/3ML SOLN Take 3 mLs by nebulization every 4 (four) hours as needed (shortness of breath/wheezing).   Yes [provider]  levothyroxine  (SYNTHROID ) 88 MCG tablet Take 88 mcg by mouth daily before breakfast.   Yes [provider]  Menthol-Zinc  Oxide (CALMOSEPTINE) 0.44-20.6 % OINT Apply 1 Application topically daily as needed (preservation/protection after incontinent care).   Yes [provider]  Multiple Vitamins-Minerals (MULTIVITAMIN WITH MINERALS) tablet Take 1 tablet by mouth daily.   Yes [provider]  MYRBETRIQ  50 MG TB24 tablet Take 1 tablet (50 mg total) by mouth daily. 08/04/23  Yes Gerldine Lauraine BROCKS, FNP  NOVOLOG  100 UNIT/ML injection Inject 14-20 Units into the skin 3 (three) times daily with meals. 90-150= 14  units; 151-200= 15 units; 201-250= 16 units; 251-300= 17 units; 301-350= 18 units; 351-400= 19 units; above 400 = 20 units 10/25/24  Yes Reardon, Benton PARAS, NP  Nutritional Supplements (BOOST GLUCOSE CONTROL) LIQD Take 240 mLs by mouth 2 (two) times daily with a meal.   Yes [provider]  OXYGEN  Inhale 2 L into the lungs continuous.   Yes [provider]  potassium chloride  SA (K-DUR) 20 MEQ tablet Take 20 mEq by mouth daily.   Yes [provider]  saccharomyces boulardii (FLORASTOR) 250 MG capsule Take 250 mg by mouth 2 (two) times daily.   Yes [provider]  sertraline  (ZOLOFT ) 100 MG tablet Take 1 tablet (100 mg total) by mouth daily. 10/18/24  Yes Tat, Alm, MD  sodium chloride  (OCEAN) 0.65 % SOLN nasal spray Place 1 spray into both nostrils every 2 (two) hours as needed (dry nasal passages).   Yes [provider]  torsemide  40 MG TABS Take 40 mg by mouth 2 (two) times daily. 10/18/24  Yes TatAlm, MD  BD AUTOSHIELD DUO 30G X 5 MM MISC  12/31/23   [provider]  Emollient (EUCERIN ADVANCED REPAIR) CREA Apply 1 Application topically every 12 (twelve) hours as needed (hydration of skin). Apply to bilateral lower extremities    [provider]  estradiol  (ESTRACE ) 0.1 MG/GM vaginal cream Discard plastic applicator. Insert a blueberry size amount (approximately 1 gram) of cream on fingertip inside vagina at bedtime every night for 1 week then every other night routinely (for long term use). 09/09/23   Larocco, Lauraine BROCKS, FNP  HYDROcodone-acetaminophen  (NORCO/VICODIN) 5-325 MG tablet Take 1 tablet by mouth every 6 (six) hours as needed (post op pain).    [provider]  omeprazole (PRILOSEC) 20 MG capsule Take 20 mg by mouth daily.    [provider]  ondansetron  (ZOFRAN ) 8 MG tablet Take 8 mg by mouth 2 (two) times daily. 12/09/23   [provider]  trimethoprim  (TRIMPEX ) 100 MG tablet Take 1 tablet (100 mg  total) by mouth daily. 09/09/23   Gerldine Lauraine BROCKS, FNP    Current Facility-Administered Medications  Medication Dose Route Frequency Provider Last Rate Last Admin   acetaminophen  (TYLENOL ) tablet 650 mg  650 mg Oral Q6H PRN Tat, David, MD   650 mg at 12/17/24 0930   Or   acetaminophen  (TYLENOL ) suppository 650 mg  650 mg Rectal Q6H PRN Tat, Alm, MD       amLODipine  (NORVASC ) tablet 10 mg  10 mg Oral Daily Tat, David, MD   10 mg at 12/17/24 0930  ascorbic acid  (VITAMIN C ) tablet 500 mg  500 mg Oral BID Tat, David, MD   500 mg at 12/17/24 0930   atorvastatin  (LIPITOR) tablet 10 mg  10 mg Oral QHS Tat, David, MD   10 mg at 12/16/24 2135   fluticasone  furoate-vilanterol (BREO ELLIPTA ) 200-25 MCG/ACT 1 puff  1 puff Inhalation Daily Tat, David, MD   1 puff at 12/17/24 9163   gabapentin  (NEURONTIN ) capsule 300 mg  300 mg Oral BID Evonnie Lenis, MD   300 mg at 12/17/24 9070   hydrALAZINE  (APRESOLINE ) tablet 25 mg  25 mg Oral Q8H Tat, David, MD   25 mg at 12/16/24 2135   hydrocerin (EUCERIN) cream   Topical BID Daniels, James K, NP   Given at 12/17/24 0930   insulin  aspart (novoLOG ) injection 0-15 Units  0-15 Units Subcutaneous Q4H Daniels, James K, NP   5 Units at 12/17/24 9587   insulin  glargine-yfgn (SEMGLEE ) injection 10 Units  10 Units Subcutaneous Daily Tat, David, MD       levothyroxine  (SYNTHROID ) tablet 88 mcg  88 mcg Oral QAC breakfast Tat, David, MD       mirabegron  ER (MYRBETRIQ ) tablet 50 mg  50 mg Oral Daily Tat, David, MD   50 mg at 12/17/24 9070   multivitamin with minerals tablet 1 tablet  1 tablet Oral Daily Tat, David, MD   1 tablet at 12/17/24 0930   pantoprazole  (PROTONIX ) EC tablet 40 mg  40 mg Oral Daily Tat, Lenis, MD   40 mg at 12/17/24 0930   potassium chloride  SA (KLOR-CON  M) CR tablet 20 mEq  20 mEq Oral Daily Tat, David, MD   20 mEq at 12/17/24 9070   prochlorperazine  (COMPAZINE ) injection 10 mg  10 mg Intravenous Q6H PRN Tat, Lenis, MD       sertraline  (ZOLOFT ) tablet 100  mg  100 mg Oral Daily Tat, David, MD   100 mg at 12/17/24 0930   torsemide  (DEMADEX ) tablet 40 mg  40 mg Oral BID Evonnie Lenis, MD   40 mg at 12/17/24 9070   trimethoprim  (TRIMPEX ) tablet 100 mg  100 mg Oral Daily Tat, Lenis, MD   100 mg at 12/17/24 0930    Allergies as of 12/16/2024 - Review Complete 12/16/2024  Allergen Reaction Noted   Carvedilol  Palpitations 01/30/2020   Sulfa antibiotics Swelling 08/20/2011   Trulicity [dulaglutide] Diarrhea 09/07/2017   Benicar [olmesartan] Swelling 07/22/2014   Ceftriaxone  Hives and Rash 06/27/2023   Codeine Other (See Comments) 08/20/2011    Family History  Problem Relation Age of Onset   Stroke Mother    Diabetes Father    Heart failure Father    Hypertension Father    Diabetes Sister    Heart failure Sister    Hypertension Sister    Stroke Sister    Cancer Other    Stroke Sister     Social History   Socioeconomic History   Marital status: Married    Spouse name: Not on file   Number of children: Not on file   Years of education: Not on file   Highest education level: Not on file  Occupational History   Not on file  Tobacco Use   Smoking status: Never    Passive exposure: Yes   Smokeless tobacco: Never  Vaping Use   Vaping status: Never Used  Substance and Sexual Activity   Alcohol use: No    Alcohol/week: 0.0 standard drinks of alcohol   Drug use: No  Sexual activity: Yes    Birth control/protection: None  Other Topics Concern   Not on file  Social History Narrative   Lives with husband.  One living son.  Daughter died after transplant age 51.     Are you right handed or left handed? Right   Are you currently employed ? 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    What type of home do you live in: 1 story or 2 story? one       Social Drivers of Health   Tobacco Use: Medium Risk (12/16/2024)   Patient History    Smoking Tobacco Use: Never    Smokeless Tobacco  Use: Never    Passive Exposure: Yes  Financial Resource Strain: Not on file  Food Insecurity: No Food Insecurity (12/16/2024)   Epic    Worried About Programme Researcher, Broadcasting/film/video in the Last Year: Never true    Ran Out of Food in the Last Year: Never true  Transportation Needs: No Transportation Needs (12/16/2024)   Epic    Lack of Transportation (Medical): No    Lack of Transportation (Non-Medical): No  Physical Activity: Not on file  Stress: Not on file  Social Connections: Unknown (04/08/2022)   [facility]   Social Network    Social Network: Not on file  Intimate Partner Violence: Not At Risk (12/16/2024)   Epic    Fear of Current or Ex-Partner: No    Emotionally Abused: No    Physically Abused: No    Sexually Abused: No  Depression (PHQ2-9): Not on file  Alcohol Screen: Not on file  Housing: Low Risk (12/16/2024)   Epic    Unable to Pay for Housing in the Last Year: No    Number of Times Moved in the Last Year: 0    Homeless in the Last Year: No  Utilities: Not At Risk (12/16/2024)   Epic    Threatened with loss of utilities: No  Health Literacy: Not on file     Review of Systems   Gen: Denies any fever, chills, loss of appetite, change in weight or weight loss CV: Denies chest pain, heart palpitations, syncope, edema  Resp: Denies shortness of breath with rest, cough, wheezing, coughing up blood, and pleurisy. GI: see HPI GU : Denies urinary burning, blood in urine, urinary frequency, and urinary incontinence. MS: Denies joint pain, limitation of movement, swelling, cramps, and atrophy.  Derm: Denies rash, itching, dry skin, hives. Psych: Denies depression, anxiety, memory loss, hallucinations, and confusion. Heme: Denies bruising or bleeding Neuro:  Denies any headaches, dizziness, paresthesias, shaking  Physical Exam   Vital Signs in last 24 hours: Temp:  [98 F (36.7 C)-98.3 F (36.8 C)] 98.3 F (36.8 C) (01/12 0500) Pulse Rate:  [66-76] 71 (01/12  0500) Resp:  [16-18] 18 (01/11 1624) BP: (146-164)/(59-72) 146/67 (01/12 0500) SpO2:  [92 %-98 %] 95 % (01/12 0837) Weight:  [123.8 kg] 123.8 kg (01/11 1034) Last BM Date : 12/16/24  General:   Alert,  Well-developed, well-nourished, pleasant and cooperative in NAD Head:  Normocephalic and atraumatic. Eyes:  Sclera clear, no icterus.   Conjunctiva pink. Lungs:  Clear throughout to auscultation.   Heart:  S1 S2 present without obvious murmurs Abdomen:  Soft, obese, large AP diameter, nontender and nondistended. No masses, hepatosplenomegaly or hernias noted. Normal bowel sounds, without guarding, and without rebound.   Rectal: deferred  Msk:  Symmetrical without gross deformities. Normal posture. Extremities:  Without edema. Neurologic:  Alert and  oriented x4. Psych:  Alert and cooperative. Normal mood and affect.  Intake/Output from previous day: 01/11 0701 - 01/12 0700 In: 500 [IV Piggyback:500] Out: -  Intake/Output this shift: No intake/output data recorded.    Labs/Studies   Recent Labs Recent Labs    12/16/24 1050 12/17/24 0449  WBC 9.8 9.2  HGB 12.1 10.6*  HCT 36.8 33.7*  PLT 328 296   BMET Recent Labs    12/16/24 1050 12/17/24 0449  NA 142 142  K 3.7 3.8  CL 100 100  CO2 36* 37*  GLUCOSE 85 194*  BUN 33* 27*  CREATININE 2.06* 1.96*  CALCIUM  9.5 9.3   LFT Recent Labs    12/16/24 1050  PROT 6.9  ALBUMIN 3.6  AST 21  ALT 14  ALKPHOS 110  BILITOT 0.3   PT/INR Recent Labs    12/16/24 1139  LABPROT 12.7  INR 0.9     Radiology/Studies DG Chest Port 1 View Result Date: 12/16/2024 CLINICAL DATA:  Rectal bleeding. EXAM: PORTABLE CHEST 1 VIEW COMPARISON:  10/14/2024 and CT chest 06/27/2023. FINDINGS: View is apical lordotic. Trachea is midline. Heart is enlarged. Lungs are clear. No pleural fluid. IMPRESSION: No acute findings. Electronically Signed   By: Newell Eke M.D.   On: 12/16/2024 11:19     Assessment   [name] is  a 64 y.o. 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   Rectal bleeding: in setting of constipation, intermittent straining, suspect could be hemorrhoidal, diverticular, unable to exclude malignancy in light of her history. Notably, CT abd/pelvis without contrast Dec 12, 2024 without metastatic disease identified. Overdue for high risk surveillance colonoscopy. Unfortunately, she does not have clear output and will need additional prep this afternoon. Will plan on diagnostic colonoscopy 1/13. Hgb 10.6 today, with baseline appearing to be 10/11 range (12.1 on admission and suspect could be hemo-concentrated).   Barrett's esophagus: diagnosed in 2023 with surveillance due in 2028. Holding off on EGD as does not appear to be rapid transit GI bleed.    Plan / Recommendations    Clear liquids Additional prep this afternoon Plan on colonoscopy 1/13. Cancelling colonoscopy for today NPO after midnight except sips with meds PPI daily EGD for Barrett's surveillance in 2028 unless clinical changes     12/17/2024, 9:33 AM  Therisa MICAEL Stager, PhD, ANP-BC [facility]      "

## 2024-12-17 NOTE — TOC Initial Note (Addendum)
 Transition of Care [facility]) - Initial/Assessment Note    Patient Details  Name: [name] MRN: [id number] Date of Birth: 09/27/1961  Transition of [facility]) CM/SW Contact:    Mcarthur Saddie Kim, LCSW Phone Number: 12/17/2024, 8:05 AM  Clinical Narrative: Pt admitted due to hematochezia. Assessment completed with pt. She reports she has been a resident at [facility] for about two and a half years. Plan is for return when medically stable. Michael at [facility] confirms pt is LTC and okay for return. TOC will follow.                   Expected Discharge Plan: Skilled Nursing Facility Barriers to Discharge: Continued Medical Work up   Patient Goals and CMS Choice Patient states their goals for this hospitalization and ongoing recovery are:: return to LTC   Choice offered to / list presented to : Patient [place] ownership interest in [facility].provided to::  (n/a)    Expected Discharge Plan and Services In-house Referral: Clinical Social Work   Post Acute Care Choice: Nursing Home Living arrangements for the past 2 months: Skilled Nursing Facility                                      Prior Living Arrangements/Services Living arrangements for the past 2 months: Skilled Nursing Facility Lives with:: Facility Resident Patient language and need for interpreter reviewed:: Yes Do you feel safe going back to the place where you live?: Yes      Need for Family Participation in Patient Care: No (Comment) Care giver support system in place?: Yes (comment)   Criminal Activity/Legal Involvement Pertinent to Current Situation/Hospitalization: No - Comment as needed  Activities of Daily Living   ADL Screening (condition at time of admission) Independently performs ADLs?: No Does the patient have a NEW difficulty with bathing/dressing/toileting/self-feeding that is expected to last >3 days?: No Does the patient have a NEW difficulty with getting in/out of  bed, walking, or climbing stairs that is expected to last >3 days?: No Does the patient have a NEW difficulty with communication that is expected to last >3 days?: No Is the patient deaf or have difficulty hearing?: No Does the patient have difficulty seeing, even when wearing glasses/contacts?: No Does the patient have difficulty concentrating, remembering, or making decisions?: No  Permission Sought/Granted Permission sought to share information with : Facility Industrial/product Designer granted to share information with : Yes, Verbal Permission Granted     Permission granted to share info w AGENCY: [employer] granted to share info w Relationship: SNF     Emotional Assessment     Affect (typically observed): Appropriate Orientation: : Oriented to Place, Oriented to  Time, Oriented to Situation, Oriented to Self Alcohol / Substance Use: Not Applicable Psych Involvement: No (comment)  Admission diagnosis:  Hematochezia [K92.1] Rectal bleeding [K62.5] CKD (chronic kidney disease) stage 4, GFR 15-29 ml/min (HCC) [N18.4] History of colon cancer [Z85.038] Patient Active Problem List   Diagnosis Date Noted   Hematochezia 12/16/2024   CKD (chronic kidney disease) stage 4, GFR 15-29 ml/min (HCC) 12/16/2024   COPD with acute exacerbation (HCC) 10/14/2024   Lobar pneumonia 11/16/2023   Acute on chronic respiratory failure with hypoxia and hypercapnia (HCC) 11/16/2023   Encephalopathy acute 11/12/2023   Acquired renal cyst of right kidney 08/04/2023   Mixed stress and urge incontinence 08/04/2023  Ambulatory dysfunction 08/04/2023   Urinary urgency 08/04/2023   Urge incontinence 08/04/2023   Urinary frequency 08/04/2023   Cognitive communication deficit 08/04/2023   Overactive bladder 08/02/2023   Recurrent UTI 08/02/2023   Left carpal tunnel syndrome 05/27/2023   Right carpal tunnel syndrome 05/27/2023   Polyneuropathy associated with underlying disease  09/20/2022   Sensorineural hearing loss (SNHL) of both ears 09/20/2022   Colon cancer (HCC) 08/11/2022   Cancer of sigmoid colon (HCC) 05/12/2022   Iron deficiency anemia    Pressure injury of skin 11/24/2021   Chronic venous stasis 11/22/2021   Leukocytosis 11/22/2021   Thrombocytosis 11/22/2021   Hyperglycemia due to diabetes mellitus (HCC) 11/22/2021   Lactic acidosis 11/22/2021   Hypoalbuminemia due to protein-calorie malnutrition 11/22/2021   Elevated liver enzymes 11/22/2021   Mixed hyperlipidemia 11/22/2021   GERD (gastroesophageal reflux disease) 11/22/2021   Chronic diastolic heart failure (HCC) 11/22/2021   Diabetic foot infection (HCC) 11/22/2021   Uncontrolled type 2 diabetes mellitus with hyperglycemia, with long-term current use of insulin  (HCC) 11/22/2021   Lower extremity cellulitis 11/21/2021   Class 3 severe obesity with serious comorbidity and body mass index (BMI) of 50.0 to 59.9 in adult (HCC) 07/10/2021   Morbid obesity with BMI of 50.0-59.9, adult (HCC) 01/29/2020   OSA (obstructive sleep apnea) 12/21/2019   Asthma 12/21/2019   Current moderate episode of major depressive disorder (HCC) 01/16/2019   Stress incontinence of urine 01/16/2019   Irritable bowel syndrome with diarrhea 02/21/2018   Uterine prolapse 08/09/2017   Acquired hypothyroidism 09/27/2016   Anxiety, generalized 12/03/2015   Hypertension associated with diabetes (HCC) 08/26/2015   Diastolic dysfunction 05/15/2015   Prolonged QT interval 05/14/2015   Palpitations 07/23/2014   Generalized weakness 07/23/2014   Essential hypertension 07/22/2014   COPD (chronic obstructive pulmonary disease) (HCC) 07/22/2014   Depression 07/22/2014   PCP:  Patient, No Pcp Per Pharmacy:   [facility] - [place], [place] - [address] [place] [address] Phone: [phone] Fax: [phone]     Social Drivers of Health (SDOH) Social History: SDOH  Screenings   Food Insecurity: No Food Insecurity (12/16/2024)  Housing: Low Risk (12/16/2024)  Transportation Needs: No Transportation Needs (12/16/2024)  Utilities: Not At Risk (12/16/2024)  Social Connections: Unknown (04/08/2022)   Received from [facility]  Tobacco Use: Medium Risk (12/16/2024)   SDOH Interventions:     Readmission Risk Interventions    10/16/2024   10:44 AM 10/15/2024    1:51 PM 11/14/2023   11:51 AM  Readmission Risk Prevention Plan  Transportation Screening Complete Complete Complete  PCP or Specialist Appt within 3-5 Days Complete    HRI or Home Care Consult Complete Complete Complete  Social Work Consult for Recovery Care Planning/Counseling Complete Complete Complete  Palliative Care Screening Complete -- Not Applicable  Medication Review Oceanographer) Complete Complete Complete

## 2024-12-17 NOTE — Plan of Care (Signed)
   Problem: Health Behavior/Discharge Planning: Goal: Ability to manage health-related needs will improve Outcome: Progressing   Problem: Activity: Goal: Risk for activity intolerance will decrease Outcome: Progressing

## 2024-12-17 NOTE — Progress Notes (Signed)
 Provider, Lynwood Kipper, NP, advised to hold PO hydralazine  and levothyroxine  morning dose on 12/17/24, d/t pending colonoscopy

## 2024-12-17 NOTE — H&P (View-Only) (Signed)
 "  Gastroenterology Consult   Referring Provider: [facility] Primary Care Physician:  Patient, No Pcp Per Primary Gastroenterologist:  Dr. Eartha previously  Patient ID: [name]; [id number]; 20-Feb-1961   Admit date: 12/16/2024  LOS: 0 days   Date of Consultation: 12/17/2024  Reason for Consultation:  GI bleeding  History of Present Illness   [name] is a 64 y.o. year old female presenting to the [facility] ED yesterday from [facility] due to acute onset of rectal bleeding with clots. 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 She is followed [facility] with serial imaging and follow-up every 6 months. Additional history includes OSA, DM, HTN, depression, COPD, CHF, Barrett's esophagus with EGD surveillance due in 2028.   In the ED: Hgb 12.1, appears previously has been 10/11 range. Hgb 10.6 today.   She had CT abd/pelvis without contrast Dec 12, 2024 without metastatic disease identified.   She has prepped overnight with plans for colonoscopy today. However, she has murky output, still with soft pieces of stool. Not clear. She drank entire prep.   Notes for the past 2 months has had more straining, harder stool. BM daily but small amounts and has hard stool at times. Sometimes better than others. Noted rectal bleeding yesterday. States was a large amount that was dark red with clots. No rectal bleeding today.   No abdominal pain. Good appetite. No unexplained weight loss. No dysphagia.   [place] for past 3 years. Uses walker for ambulation. Oxygen  at nighttime, none during the day.   No anticoagulation.    EGD May 2023: multiple gastric polyps s/p resection and retrieval, clip placed, erythematous mucosa s/p biopsy, normal duodenum. Path: hyperplastic polyps, no H .pylori, reactive gastropathy, +Barrett's. 5 year surveillance recommended.    Colonoscopy May  2023:  Two 6 to 8 mm polyps in the transverse colon and  in the ascending colon, removed with a cold snare. Two 6-12 mm polyps in descending colon, likely malignant partially obstructing tumor at 12 cm proximal to the anus s/p biopsy and injected.                             Past Medical History:  Diagnosis Date   Anemia    Arthritis    Asthma    Cancer of sigmoid (HCC)    COPD (chronic obstructive pulmonary disease) (HCC)    Depression    Diabetes mellitus    Diastolic dysfunction 05/15/2015   Dyspnea    Generalized weakness    Hyperlipidemia    Hypertension    Hypothyroidism    Obesity    Palpitations    Pneumonia    PONV (postoperative nausea and vomiting)    Prolonged QT interval 05/14/2015   Sleep apnea     Past Surgical History:  Procedure Laterality Date   BIOPSY  04/06/2022   Procedure: BIOPSY;  Surgeon: Eartha Angelia Sieving, MD;  Location: [facility];  Service: Gastroenterology;;   CATARACT EXTRACTION     CESAREAN SECTION     CHOLECYSTECTOMY     COLONOSCOPY WITH PROPOFOL  N/A 04/06/2022   Procedure: COLONOSCOPY WITH PROPOFOL ;  Surgeon: Eartha Angelia Sieving, MD;  Location: [facility];  Service: Gastroenterology;  Laterality: N/A;  805 ASA 2 patient in [facility]   CYSTOSCOPY WITH INJECTION N/A 02/16/2024   Procedure: CYSTOSCOPY WITH  INJECTION- Bladder botox  100 units;  Surgeon: Sherrilee Belvie CROME, MD;  Location: [facility];  Service: Urology;  Laterality: N/A;   ESOPHAGOGASTRODUODENOSCOPY (EGD) WITH PROPOFOL  N/A 04/06/2022   Procedure: ESOPHAGOGASTRODUODENOSCOPY (EGD) WITH PROPOFOL ;  Surgeon: Eartha Angelia Sieving, MD;  Location: [facility];  Service: Gastroenterology;  Laterality: N/A;   HEMOSTASIS CLIP PLACEMENT  04/06/2022   Procedure: HEMOSTASIS CLIP PLACEMENT;  Surgeon: Eartha Angelia Sieving, MD;  Location: [facility];  Service: Gastroenterology;;   POLYPECTOMY  04/06/2022   Procedure: POLYPECTOMY;   Surgeon: Eartha Angelia Sieving, MD;  [facility]: [facility];  Service: Gastroenterology;;   SUBMUCOSAL TATTOO INJECTION  04/06/2022   Procedure: SUBMUCOSAL TATTOO INJECTION;  Surgeon: Eartha Angelia Sieving, MD;  Location: [facility];  Service: Gastroenterology;;    Prior to Admission medications  Medication Sig Start Date End Date Taking? Authorizing Provider  albuterol  (VENTOLIN  HFA) 108 (90 Base) MCG/ACT inhaler Inhale 2 puffs into the lungs every 6 (six) hours as needed for wheezing or shortness of breath.   Yes [provider]  alum & mag hydroxide-simeth (MAALOX/MYLANTA) 200-200-20 MG/5ML suspension Take 30 mLs by mouth every 2 (two) hours as needed for indigestion or heartburn. Do not exceed 4 doses in 24 hours   Yes [provider]  Amino Acids-Protein Hydrolys (FEEDING SUPPLEMENT, PRO-STAT 64,) LIQD Take 60 mLs by mouth daily.   Yes [provider]  amLODipine  (NORVASC ) 10 MG tablet Take 10 mg by mouth daily. 12/30/23  Yes [provider]  ascorbic acid  (VITAMIN C ) 500 MG tablet Take 500 mg by mouth 2 (two) times daily.   Yes [provider]  aspirin  EC 81 MG tablet Take 81 mg by mouth daily. Swallow whole.   Yes [provider]  atorvastatin  (LIPITOR) 10 MG tablet Take 10 mg by mouth at bedtime. 09/29/21  Yes [provider]  augmented betamethasone dipropionate (DIPROLENE-AF) 0.05 % cream Apply 1 application  topically 2 (two) times daily. Apply to trunk, buttocks, legs for Psoriasis   Yes [provider]  Biotin  10 MG TABS Take 10 mg by mouth daily.   Yes [provider]  carboxymethylcellulose 1 % ophthalmic solution Place 2 drops into both eyes every 6 (six) hours as needed (eye dryness/irritation).   Yes [provider]  cetirizine (ZYRTEC) 10 MG tablet Take 10 mg by mouth daily as needed for allergies.   Yes [provider]  clotrimazole-betamethasone (LOTRISONE) cream Apply 1  Application topically 2 (two) times a week. Apply on Tuesday and Friday and as needed for management of chronic fungal rash after shower 03/29/24  Yes [provider]  Cranberry 300 MG tablet Take 300 mg by mouth 2 (two) times daily.   Yes [provider]  docusate sodium  (COLACE) 100 MG capsule Take 100 mg by mouth daily.   Yes [provider]  fluticasone -salmeterol (ADVAIR) 250-50 MCG/ACT AEPB Inhale 1 puff into the lungs 2 (two) times daily. 12/24/23  Yes [provider]  gabapentin  (NEURONTIN ) 300 MG capsule Take 1 capsule in AM, 1 capsule in PM Patient taking differently: Take 300 mg by mouth 2 (two) times daily. Take 1 capsule in AM, 1 capsule in PM 09/30/23  Yes Georjean Darice HERO, MD  hydrALAZINE  (APRESOLINE ) 25 MG tablet Take 1 tablet (25 mg total) by mouth every 8 (eight) hours. Patient taking differently: Take 25 mg by mouth every 8 (eight) hours. Hold if systolic BP <110 87/77/77  Yes Johnson, Afton CROME, MD  insulin  glargine-yfgn (  SEMGLEE ) 100 UNIT/ML injection Inject 0.7 mLs (70 Units total) into the skin at bedtime. 10/25/24  Yes Reardon, Benton PARAS, NP  ipratropium-albuterol  (DUONEB) 0.5-2.5 (3) MG/3ML SOLN Take 3 mLs by nebulization every 4 (four) hours as needed (shortness of breath/wheezing).   Yes [provider]  levothyroxine  (SYNTHROID ) 88 MCG tablet Take 88 mcg by mouth daily before breakfast.   Yes [provider]  Menthol-Zinc  Oxide (CALMOSEPTINE) 0.44-20.6 % OINT Apply 1 Application topically daily as needed (preservation/protection after incontinent care).   Yes [provider]  Multiple Vitamins-Minerals (MULTIVITAMIN WITH MINERALS) tablet Take 1 tablet by mouth daily.   Yes [provider]  MYRBETRIQ  50 MG TB24 tablet Take 1 tablet (50 mg total) by mouth daily. 08/04/23  Yes Gerldine Lauraine BROCKS, FNP  NOVOLOG  100 UNIT/ML injection Inject 14-20 Units into the skin 3 (three) times daily with meals. 90-150= 14  units; 151-200= 15 units; 201-250= 16 units; 251-300= 17 units; 301-350= 18 units; 351-400= 19 units; above 400 = 20 units 10/25/24  Yes Reardon, Benton PARAS, NP  Nutritional Supplements (BOOST GLUCOSE CONTROL) LIQD Take 240 mLs by mouth 2 (two) times daily with a meal.   Yes [provider]  OXYGEN  Inhale 2 L into the lungs continuous.   Yes [provider]  potassium chloride  SA (K-DUR) 20 MEQ tablet Take 20 mEq by mouth daily.   Yes [provider]  saccharomyces boulardii (FLORASTOR) 250 MG capsule Take 250 mg by mouth 2 (two) times daily.   Yes [provider]  sertraline  (ZOLOFT ) 100 MG tablet Take 1 tablet (100 mg total) by mouth daily. 10/18/24  Yes Tat, Alm, MD  sodium chloride  (OCEAN) 0.65 % SOLN nasal spray Place 1 spray into both nostrils every 2 (two) hours as needed (dry nasal passages).   Yes [provider]  torsemide  40 MG TABS Take 40 mg by mouth 2 (two) times daily. 10/18/24  Yes TatAlm, MD  BD AUTOSHIELD DUO 30G X 5 MM MISC  12/31/23   [provider]  Emollient (EUCERIN ADVANCED REPAIR) CREA Apply 1 Application topically every 12 (twelve) hours as needed (hydration of skin). Apply to bilateral lower extremities    [provider]  estradiol  (ESTRACE ) 0.1 MG/GM vaginal cream Discard plastic applicator. Insert a blueberry size amount (approximately 1 gram) of cream on fingertip inside vagina at bedtime every night for 1 week then every other night routinely (for long term use). 09/09/23   Larocco, Lauraine BROCKS, FNP  HYDROcodone-acetaminophen  (NORCO/VICODIN) 5-325 MG tablet Take 1 tablet by mouth every 6 (six) hours as needed (post op pain).    [provider]  omeprazole (PRILOSEC) 20 MG capsule Take 20 mg by mouth daily.    [provider]  ondansetron  (ZOFRAN ) 8 MG tablet Take 8 mg by mouth 2 (two) times daily. 12/09/23   [provider]  trimethoprim  (TRIMPEX ) 100 MG tablet Take 1 tablet (100 mg  total) by mouth daily. 09/09/23   Gerldine Lauraine BROCKS, FNP    Current Facility-Administered Medications  Medication Dose Route Frequency Provider Last Rate Last Admin   acetaminophen  (TYLENOL ) tablet 650 mg  650 mg Oral Q6H PRN Tat, David, MD   650 mg at 12/17/24 0930   Or   acetaminophen  (TYLENOL ) suppository 650 mg  650 mg Rectal Q6H PRN Tat, Alm, MD       amLODipine  (NORVASC ) tablet 10 mg  10 mg Oral Daily Tat, David, MD   10 mg at 12/17/24 0930  ascorbic acid  (VITAMIN C ) tablet 500 mg  500 mg Oral BID Tat, David, MD   500 mg at 12/17/24 0930   atorvastatin  (LIPITOR) tablet 10 mg  10 mg Oral QHS Tat, David, MD   10 mg at 12/16/24 2135   fluticasone  furoate-vilanterol (BREO ELLIPTA ) 200-25 MCG/ACT 1 puff  1 puff Inhalation Daily Tat, David, MD   1 puff at 12/17/24 9163   gabapentin  (NEURONTIN ) capsule 300 mg  300 mg Oral BID Evonnie Lenis, MD   300 mg at 12/17/24 9070   hydrALAZINE  (APRESOLINE ) tablet 25 mg  25 mg Oral Q8H Tat, David, MD   25 mg at 12/16/24 2135   hydrocerin (EUCERIN) cream   Topical BID Daniels, James K, NP   Given at 12/17/24 0930   insulin  aspart (novoLOG ) injection 0-15 Units  0-15 Units Subcutaneous Q4H Daniels, James K, NP   5 Units at 12/17/24 9587   insulin  glargine-yfgn (SEMGLEE ) injection 10 Units  10 Units Subcutaneous Daily Tat, David, MD       levothyroxine  (SYNTHROID ) tablet 88 mcg  88 mcg Oral QAC breakfast Tat, David, MD       mirabegron  ER (MYRBETRIQ ) tablet 50 mg  50 mg Oral Daily Tat, David, MD   50 mg at 12/17/24 9070   multivitamin with minerals tablet 1 tablet  1 tablet Oral Daily Tat, David, MD   1 tablet at 12/17/24 0930   pantoprazole  (PROTONIX ) EC tablet 40 mg  40 mg Oral Daily Tat, Lenis, MD   40 mg at 12/17/24 0930   potassium chloride  SA (KLOR-CON  M) CR tablet 20 mEq  20 mEq Oral Daily Tat, David, MD   20 mEq at 12/17/24 9070   prochlorperazine  (COMPAZINE ) injection 10 mg  10 mg Intravenous Q6H PRN Tat, Lenis, MD       sertraline  (ZOLOFT ) tablet 100  mg  100 mg Oral Daily Tat, David, MD   100 mg at 12/17/24 0930   torsemide  (DEMADEX ) tablet 40 mg  40 mg Oral BID Evonnie Lenis, MD   40 mg at 12/17/24 9070   trimethoprim  (TRIMPEX ) tablet 100 mg  100 mg Oral Daily Tat, Lenis, MD   100 mg at 12/17/24 0930    Allergies as of 12/16/2024 - Review Complete 12/16/2024  Allergen Reaction Noted   Carvedilol  Palpitations 01/30/2020   Sulfa antibiotics Swelling 08/20/2011   Trulicity [dulaglutide] Diarrhea 09/07/2017   Benicar [olmesartan] Swelling 07/22/2014   Ceftriaxone  Hives and Rash 06/27/2023   Codeine Other (See Comments) 08/20/2011    Family History  Problem Relation Age of Onset   Stroke Mother    Diabetes Father    Heart failure Father    Hypertension Father    Diabetes Sister    Heart failure Sister    Hypertension Sister    Stroke Sister    Cancer Other    Stroke Sister     Social History   Socioeconomic History   Marital status: Married    Spouse name: Not on file   Number of children: Not on file   Years of education: Not on file   Highest education level: Not on file  Occupational History   Not on file  Tobacco Use   Smoking status: Never    Passive exposure: Yes   Smokeless tobacco: Never  Vaping Use   Vaping status: Never Used  Substance and Sexual Activity   Alcohol use: No    Alcohol/week: 0.0 standard drinks of alcohol   Drug use: No  Sexual activity: Yes    Birth control/protection: None  Other Topics Concern   Not on file  Social History Narrative   Lives with husband.  One living son.  Daughter died after transplant age 51.     Are you right handed or left handed? Right   Are you currently employed ? 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    What type of home do you live in: 1 story or 2 story? one       Social Drivers of Health   Tobacco Use: Medium Risk (12/16/2024)   Patient History    Smoking Tobacco Use: Never    Smokeless Tobacco  Use: Never    Passive Exposure: Yes  Financial Resource Strain: Not on file  Food Insecurity: No Food Insecurity (12/16/2024)   Epic    Worried About Programme Researcher, Broadcasting/film/video in the Last Year: Never true    Ran Out of Food in the Last Year: Never true  Transportation Needs: No Transportation Needs (12/16/2024)   Epic    Lack of Transportation (Medical): No    Lack of Transportation (Non-Medical): No  Physical Activity: Not on file  Stress: Not on file  Social Connections: Unknown (04/08/2022)   [facility]   Social Network    Social Network: Not on file  Intimate Partner Violence: Not At Risk (12/16/2024)   Epic    Fear of Current or Ex-Partner: No    Emotionally Abused: No    Physically Abused: No    Sexually Abused: No  Depression (PHQ2-9): Not on file  Alcohol Screen: Not on file  Housing: Low Risk (12/16/2024)   Epic    Unable to Pay for Housing in the Last Year: No    Number of Times Moved in the Last Year: 0    Homeless in the Last Year: No  Utilities: Not At Risk (12/16/2024)   Epic    Threatened with loss of utilities: No  Health Literacy: Not on file     Review of Systems   Gen: Denies any fever, chills, loss of appetite, change in weight or weight loss CV: Denies chest pain, heart palpitations, syncope, edema  Resp: Denies shortness of breath with rest, cough, wheezing, coughing up blood, and pleurisy. GI: see HPI GU : Denies urinary burning, blood in urine, urinary frequency, and urinary incontinence. MS: Denies joint pain, limitation of movement, swelling, cramps, and atrophy.  Derm: Denies rash, itching, dry skin, hives. Psych: Denies depression, anxiety, memory loss, hallucinations, and confusion. Heme: Denies bruising or bleeding Neuro:  Denies any headaches, dizziness, paresthesias, shaking  Physical Exam   Vital Signs in last 24 hours: Temp:  [98 F (36.7 C)-98.3 F (36.8 C)] 98.3 F (36.8 C) (01/12 0500) Pulse Rate:  [66-76] 71 (01/12  0500) Resp:  [16-18] 18 (01/11 1624) BP: (146-164)/(59-72) 146/67 (01/12 0500) SpO2:  [92 %-98 %] 95 % (01/12 0837) Weight:  [123.8 kg] 123.8 kg (01/11 1034) Last BM Date : 12/16/24  General:   Alert,  Well-developed, well-nourished, pleasant and cooperative in NAD Head:  Normocephalic and atraumatic. Eyes:  Sclera clear, no icterus.   Conjunctiva pink. Lungs:  Clear throughout to auscultation.   Heart:  S1 S2 present without obvious murmurs Abdomen:  Soft, obese, large AP diameter, nontender and nondistended. No masses, hepatosplenomegaly or hernias noted. Normal bowel sounds, without guarding, and without rebound.   Rectal: deferred  Msk:  Symmetrical without gross deformities. Normal posture. Extremities:  Without edema. Neurologic:  Alert and  oriented x4. Psych:  Alert and cooperative. Normal mood and affect.  Intake/Output from previous day: 01/11 0701 - 01/12 0700 In: 500 [IV Piggyback:500] Out: -  Intake/Output this shift: No intake/output data recorded.    Labs/Studies   Recent Labs Recent Labs    12/16/24 1050 12/17/24 0449  WBC 9.8 9.2  HGB 12.1 10.6*  HCT 36.8 33.7*  PLT 328 296   BMET Recent Labs    12/16/24 1050 12/17/24 0449  NA 142 142  K 3.7 3.8  CL 100 100  CO2 36* 37*  GLUCOSE 85 194*  BUN 33* 27*  CREATININE 2.06* 1.96*  CALCIUM  9.5 9.3   LFT Recent Labs    12/16/24 1050  PROT 6.9  ALBUMIN 3.6  AST 21  ALT 14  ALKPHOS 110  BILITOT 0.3   PT/INR Recent Labs    12/16/24 1139  LABPROT 12.7  INR 0.9     Radiology/Studies DG Chest Port 1 View Result Date: 12/16/2024 CLINICAL DATA:  Rectal bleeding. EXAM: PORTABLE CHEST 1 VIEW COMPARISON:  10/14/2024 and CT chest 06/27/2023. FINDINGS: View is apical lordotic. Trachea is midline. Heart is enlarged. Lungs are clear. No pleural fluid. IMPRESSION: No acute findings. Electronically Signed   By: Newell Eke M.D.   On: 12/16/2024 11:19     Assessment   [name] is  a 64 y.o. 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   Rectal bleeding: in setting of constipation, intermittent straining, suspect could be hemorrhoidal, diverticular, unable to exclude malignancy in light of her history. Notably, CT abd/pelvis without contrast Dec 12, 2024 without metastatic disease identified. Overdue for high risk surveillance colonoscopy. Unfortunately, she does not have clear output and will need additional prep this afternoon. Will plan on diagnostic colonoscopy 1/13. Hgb 10.6 today, with baseline appearing to be 10/11 range (12.1 on admission and suspect could be hemo-concentrated).   Barrett's esophagus: diagnosed in 2023 with surveillance due in 2028. Holding off on EGD as does not appear to be rapid transit GI bleed.    Plan / Recommendations    Clear liquids Additional prep this afternoon Plan on colonoscopy 1/13. Cancelling colonoscopy for today NPO after midnight except sips with meds PPI daily EGD for Barrett's surveillance in 2028 unless clinical changes     12/17/2024, 9:33 AM  Therisa MICAEL Stager, PhD, ANP-BC [facility]      "

## 2024-12-17 NOTE — Progress Notes (Signed)
 "          PROGRESS NOTE  [name] FMW:[id number] DOB: 05-03-1961 DOA: 12/16/2024 PCP: Patient, No Pcp Per  Brief History:   64 year old female  with medical history significant of COPD on 2 to 3 L of oxygen , DM2, hypertension, CKD 3B, chronic diastolic heart failure, morbid obesity BMI of 50, chronic respiratory failure  immobility and mostly wheelchair-bound, peripheral neuropathy, hypertension, colon cancer s/p resection (08/11/22 LOA Dr. Debby) in remission presents from [facility] with hematochezia.  The patient had a bowel movement after breakfast and noted hematochezia with clots.  She denied any fevers, chills, dizziness, chest pain, shortness breath, nausea, vomiting, diarrhea, abdominal pain.  She denies any new medications.  She is not on any blood thinners. The patient denies any new medications.   The patient has not had any endoscopies since her surgery.  In the ED, the patient was afebrile and hemodynamically stable with oxygen  saturation 96% room air.  WBC 9.8, hemoglobin 12.1, platelets 328.  Sodium 142, potassium 3.7, bicarbonate 36, serum creatinine 2.06.  INR 0.9.  LFTs unremarkable.  FOBT was positive.  Chest x-ray was negative for acute findings. EDP consulted GI, Dr. Cindie.  He recommended admission for colonoscopy.   Assessment/Plan: Hematochezia - Baseline hemoglobin 11-12 - GI consult appreciated - Plans for colonoscopy delayed till 12/18/24 due to incomplete prep - Clear liquid diet   Chronic HFpEF - Appears clinically euvolemic - Last weight at cardiology office 11/14/2024 = 291.6 pounds - Continue home dose torsemide  - 10/14/2024 echo EF 60 to 65%, no WMA, normal RVF, trivial MR/TR  - Daily weights   CKD stage IV - Baseline creatinine 1.9-2.1 - Monitor BMP   Respiratory failure with hypoxia and hypercarbia - Patient is chronically on 2-3 L   Essential hypertension - Continue amlodipine  and hydralazine    Morbid obesity - BMI 54.82 -  Lifestyle modification   Hypothyroidism - 10/14/24 TSH 3.420 - Continue Synthroid    Diabetes mellitus type 2 - 10/14/2024 hemoglobin A1c 7.2 - Started on Semglee  daily - NovoLog  sliding scale   Depression - Continue sertraline    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 Davonna -surveillance CTs and CEAs- -08/15/23 CEA 1.8 -12/12/24 CEA 3.5   Mixed hyperlipidemia -Continue statin       Family Communication:  husband at bedside 1/12  Consultants:  GI  Code Status:  FULL   DVT Prophylaxis:  SCDs   Procedures: As Listed in Progress Note Above  Antibiotics: None      Subjective: Pt did not see any further hematochezia.  Denies cp, sob, abd pain melena, n/v  Objective: Vitals:   12/17/24 0500 12/17/24 0837 12/17/24 1335 12/17/24 1341  BP: (!) 146/67  (!) 131/53 (!) 131/53  Pulse: 71  78   Resp:      Temp: 98.3 F (36.8 C)  98.6 F (37 C)   TempSrc: Oral  Oral   SpO2: 92% 95% 95%   Weight:      Height:        Intake/Output Summary (Last 24 hours) at 12/17/2024 1610 Last data filed at 12/17/2024 1314 Gross per 24 hour  Intake 720 ml  Output --  Net 720 ml   Weight change:  Exam:  General:  Pt is alert, follows commands appropriately, not in acute distress HEENT: No icterus, No thrush, No neck mass, [place]/AT Cardiovascular: RRR, S1/S2, no rubs, no gallops Respiratory: CTA bilaterally, no wheezing, no  crackles, no rhonchi Abdomen: Soft/+BS, non tender, non distended, no guarding Extremities: trace LE edema, No lymphangitis, No petechiae, No rashes, no synovitis   Data Reviewed: I have personally reviewed following labs and imaging studies Basic Metabolic Panel: Recent Labs  Lab 12/12/24 1021 12/16/24 1050 12/17/24 0449  NA 139 142 142  K 3.7 3.7 3.8  CL 94* 100 100  CO2 30 36* 37*  GLUCOSE 299* 85 194*  BUN 52* 33* 27*  CREATININE 2.45* 2.06* 1.96*  CALCIUM  9.3 9.5 9.3   Liver  Function Tests: Recent Labs  Lab 12/12/24 1021 12/16/24 1050  AST 26 21  ALT 22 14  ALKPHOS 123 110  BILITOT 0.3 0.3  PROT 6.9 6.9  ALBUMIN 3.7 3.6   No results for input(s): LIPASE, AMYLASE in the last 168 hours. No results for input(s): AMMONIA in the last 168 hours. Coagulation Profile: Recent Labs  Lab 12/16/24 1139  INR 0.9   CBC: Recent Labs  Lab 12/12/24 1021 12/16/24 1050 12/17/24 0449  WBC 7.3 9.8 9.2  NEUTROABS 4.9 7.2  --   HGB 11.8* 12.1 10.6*  HCT 36.5 36.8 33.7*  MCV 80.2 80.2 81.6  PLT 311 328 296   Cardiac Enzymes: No results for input(s): CKTOTAL, CKMB, CKMBINDEX, TROPONINI in the last 168 hours. BNP: Invalid input(s): POCBNP CBG: Recent Labs  Lab 12/16/24 2104 12/16/24 2354 12/17/24 0404 12/17/24 0748 12/17/24 1139  GLUCAP 321* 324* 206* 125* 253*   HbA1C: No results for input(s): HGBA1C in the last 72 hours. Urine analysis:    Component Value Date/Time   COLORURINE YELLOW 11/12/2023 1001   APPEARANCEUR Clear 03/01/2024 1432   LABSPEC 1.013 11/12/2023 1001   PHURINE 5.0 11/12/2023 1001   GLUCOSEU Negative 03/01/2024 1432   HGBUR NEGATIVE 11/12/2023 1001   BILIRUBINUR Negative 03/01/2024 1432   KETONESUR NEGATIVE 11/12/2023 1001   PROTEINUR Urine 03/01/2024 1432   PROTEINUR 100 (A) 11/12/2023 1001   UROBILINOGEN 0.2 05/15/2015 0112   NITRITE Negative 03/01/2024 1432   NITRITE NEGATIVE 11/12/2023 1001   LEUKOCYTESUR Negative 03/01/2024 1432   LEUKOCYTESUR NEGATIVE 11/12/2023 1001   Sepsis Labs: @LABRCNTIP (procalcitonin:4,lacticidven:4) )No results found for this or any previous visit (from the past 240 hours).   Scheduled Meds:  amLODipine   10 mg Oral Daily   ascorbic acid   500 mg Oral BID   atorvastatin   10 mg Oral QHS   bisacodyl   10 mg Oral Q4H   fluticasone  furoate-vilanterol  1 puff Inhalation Daily   gabapentin   300 mg Oral BID   hydrALAZINE   25 mg Oral Q8H   hydrocerin   Topical BID   insulin   aspart  0-15 Units Subcutaneous Q4H   insulin  glargine-yfgn  10 Units Subcutaneous Daily   levothyroxine   88 mcg Oral QAC breakfast   mirabegron  ER  50 mg Oral Daily   multivitamin with minerals  1 tablet Oral Daily   pantoprazole   40 mg Oral Daily   polyethylene glycol  17 g Oral Q1H   potassium chloride  SA  20 mEq Oral Daily   sertraline   100 mg Oral Daily   torsemide   40 mg Oral BID   trimethoprim   100 mg Oral Daily   Continuous Infusions:  Procedures/Studies: DG Chest Port 1 View Result Date: 12/16/2024 CLINICAL DATA:  Rectal bleeding. EXAM: PORTABLE CHEST 1 VIEW COMPARISON:  10/14/2024 and CT chest 06/27/2023. FINDINGS: View is apical lordotic. Trachea is midline. Heart is enlarged. Lungs are clear. No pleural fluid. IMPRESSION: No acute findings. Electronically Signed  By: Newell Eke M.D.   On: 12/16/2024 11:19   CT ABDOMEN PELVIS WO CONTRAST Result Date: 12/12/2024 CLINICAL DATA:  Colon cancer, stage II/III, monitor. * Tracking Code: BO * EXAM: CT ABDOMEN AND PELVIS WITHOUT CONTRAST TECHNIQUE: Multidetector CT imaging of the abdomen and pelvis was performed following the standard protocol without IV contrast. RADIATION DOSE REDUCTION: This exam was performed according to the departmental dose-optimization program which includes automated exposure control, adjustment of the mA and/or kV according to patient size and/or use of iterative reconstruction technique. COMPARISON:  MRI abdomen from 01/31/2024 and CT scan abdomen and pelvis from 12/28/2023. FINDINGS: Lower chest: The lung bases are clear. No pleural effusion. The heart is normal in size. No pericardial effusion. Hepatobiliary: Superior most portion of the liver is not imaged. Redemonstration of ill-defined hypoattenuating area in the left hepatic lobe, segment 4B region near the gallbladder fossa, which is grossly similar to the prior study. No suspicious liver lesion seen. Visualized liver otherwise appears grossly  unremarkable. No intrahepatic or extrahepatic bile duct dilation. Gallbladder is surgically absent. Pancreas: Small/atrophic pancreas, essentially unchanged. No focal lesion or peripancreatic fat stranding. Spleen: Within normal limits. No focal lesion. Adrenals/Urinary Tract: Adrenal glands are unremarkable. No suspicious renal mass within the limitations of this unenhanced exam. No nephroureterolithiasis or obstructive uropathy. There is linear hyperattenuating area arising from the lateral aspect of the right kidney lower pole, which corresponds to benign cyst on the prior exam from 12/28/2023. Findings therefore favor interval involution of previously seen cyst. Urinary bladder is under distended, precluding optimal assessment. However, no large mass or stones identified. No perivesical fat stranding. Stomach/Bowel: There is a small sliding hiatal hernia. Rectosigmoid anastomotic suture noted. No disproportionate dilation of the small or large bowel loops. No evidence of abnormal bowel wall thickening or inflammatory changes. The appendix is unremarkable. Vascular/Lymphatic: No ascites or pneumoperitoneum. No abdominal or pelvic lymphadenopathy, by size criteria. No aneurysmal dilation of the major abdominal arteries. There are mild peripheral atherosclerotic vascular calcifications of the aorta and its major branches. Reproductive: The uterus is unremarkable. No large adnexal mass. Other: Periumbilical surgical scar noted. The soft tissues and abdominal wall are otherwise unremarkable. Musculoskeletal: No suspicious osseous lesions. There are mild multilevel degenerative changes in the visualized spine. IMPRESSION: 1. No metastatic colon cancer identified within the abdomen or pelvis. 2. Superior most portion of the liver is not imaged. Stable previously seen ill-defined hypoattenuating area, better characterized on the prior MRI abdomen and favored benign. 3. Multiple other nonacute observations, as described  above. Aortic Atherosclerosis (ICD10-I70.0). Electronically Signed   By: Ree Molt M.D.   On: 12/12/2024 14:25    Alm Schneider, DO  [facility]  If 7PM-7AM, please contact night-coverage [email or web address] Password [email or web address] 12/17/2024, 4:10 PM   LOS: 0 days   "

## 2024-12-18 ENCOUNTER — Encounter (HOSPITAL_COMMUNITY)
Admission: EM | Disposition: A | Discharge status: Skilled Nursing Facility | Payer: Self-pay | Source: Skilled Nursing Facility | Attending: Emergency Medicine

## 2024-12-18 ENCOUNTER — Observation Stay (HOSPITAL_COMMUNITY): Admitting: Anesthesiology

## 2024-12-18 ENCOUNTER — Encounter (HOSPITAL_COMMUNITY): Payer: Self-pay | Admitting: Internal Medicine

## 2024-12-18 DIAGNOSIS — K625 Hemorrhage of anus and rectum: Secondary | ICD-10-CM | POA: Diagnosis not present

## 2024-12-18 DIAGNOSIS — K6389 Other specified diseases of intestine: Secondary | ICD-10-CM | POA: Diagnosis not present

## 2024-12-18 DIAGNOSIS — Z980 Intestinal bypass and anastomosis status: Secondary | ICD-10-CM | POA: Diagnosis not present

## 2024-12-18 DIAGNOSIS — Z85038 Personal history of other malignant neoplasm of large intestine: Secondary | ICD-10-CM | POA: Diagnosis not present

## 2024-12-18 DIAGNOSIS — K648 Other hemorrhoids: Secondary | ICD-10-CM

## 2024-12-18 DIAGNOSIS — D62 Acute posthemorrhagic anemia: Secondary | ICD-10-CM

## 2024-12-18 DIAGNOSIS — D123 Benign neoplasm of transverse colon: Secondary | ICD-10-CM

## 2024-12-18 HISTORY — PX: POLYPECTOMY: SHX149

## 2024-12-18 HISTORY — PX: COLONOSCOPY: SHX5424

## 2024-12-18 LAB — GLUCOSE, CAPILLARY
Glucose-Capillary: 141 mg/dL — ABNORMAL HIGH (ref 70–99)
Glucose-Capillary: 181 mg/dL — ABNORMAL HIGH (ref 70–99)
Glucose-Capillary: 223 mg/dL — ABNORMAL HIGH (ref 70–99)
Glucose-Capillary: 252 mg/dL — ABNORMAL HIGH (ref 70–99)
Glucose-Capillary: 267 mg/dL — ABNORMAL HIGH (ref 70–99)

## 2024-12-18 LAB — CBC
HCT: 35.1 % — ABNORMAL LOW (ref 36.0–46.0)
Hemoglobin: 11 g/dL — ABNORMAL LOW (ref 12.0–15.0)
MCH: 25.7 pg — ABNORMAL LOW (ref 26.0–34.0)
MCHC: 31.3 g/dL (ref 30.0–36.0)
MCV: 82 fL (ref 80.0–100.0)
Platelets: 328 K/uL (ref 150–400)
RBC: 4.28 MIL/uL (ref 3.87–5.11)
RDW: 15.6 % — ABNORMAL HIGH (ref 11.5–15.5)
WBC: 8.1 K/uL (ref 4.0–10.5)
nRBC: 0 % (ref 0.0–0.2)

## 2024-12-18 MED ORDER — SODIUM CHLORIDE 0.9 % IV SOLN: INTRAVENOUS | Status: DC

## 2024-12-18 MED ORDER — PROPOFOL 10 MG/ML IV BOLUS
INTRAVENOUS | Status: DC | PRN
  Administered 2024-12-18: 100 ug/kg/min via INTRAVENOUS
  Administered 2024-12-18: 20 mg via INTRAVENOUS
  Administered 2024-12-18: 120 mg via INTRAVENOUS

## 2024-12-18 MED ORDER — LACTATED RINGERS IV SOLN: INTRAVENOUS | Status: DC

## 2024-12-18 NOTE — Transfer of Care (Signed)
 Immediate Anesthesia Transfer of Care Note  Patient: [name]  Procedure(s) Performed: COLONOSCOPY POLYPECTOMY, INTESTINE  Patient Location: PACU  Anesthesia Type:MAC  Level of Consciousness: awake and patient cooperative  Airway & Oxygen  Therapy: Patient Spontanous Breathing  Post-op Assessment: Report given to RN and Post -op Vital signs reviewed and stable  Post vital signs: Reviewed and stable  Last Vitals:  Vitals Value Taken Time  BP 116/59 12/18/24 11:55  Temp 98.1 12/18/2024  1157  Pulse 69 12/18/24 11:55  Resp 17 12/18/24 11:55  SpO2 94 % 12/18/24 11:55  Vitals shown include unfiled device data.  Last Pain:  Vitals:   12/18/24 1126  TempSrc:   PainSc: 0-No pain         Complications: No notable events documented.

## 2024-12-18 NOTE — Anesthesia Procedure Notes (Signed)
 Date/Time: 12/18/2024 11:26 AM  Performed by: Barbarann Verneita RAMAN, CRNAPre-anesthesia Checklist: Patient identified, Emergency Drugs available, Suction available, Timeout performed and Patient being monitored Patient Re-evaluated:Patient Re-evaluated prior to induction Oxygen  Delivery Method: Nasal cannula Comments: Optiflow

## 2024-12-18 NOTE — Interval H&P Note (Signed)
 History and Physical Interval Note:  12/18/2024 11:17 AM  [name]  has presented today for surgery, with the diagnosis of rectal bleeding, history of colon cancer.  The various methods of treatment have been discussed with the patient and family. After consideration of risks, benefits and other options for treatment, the patient has consented to  Procedures: COLONOSCOPY (N/A) as a surgical intervention.  The patient's history has been reviewed, patient examined, no change in status, stable for surgery.  I have reviewed the patient's chart and labs.  Questions were answered to the patient's satisfaction.     Carlin MARLA Hasty

## 2024-12-18 NOTE — TOC Transition Note (Signed)
 Transition of Care [facility]) - Discharge Note   Patient Details  Name: [name] MRN: [id number] Date of Birth: 1961/09/22  Transition of Care [facility] CM/SW Contact:  Noreen KATHEE Pinal, LCSWA Phone Number: 12/18/2024, 1:20 PM   Clinical Narrative:     Patient is DC back to [facility] . DC summary sent through the HUB and room and report number was provided by Ozell and given to nurse. Patient and spouse made aware. Spouse stated that he will transport patient back to facility. CSW signing off.   Final next level of care: Skilled Nursing Facility Barriers to Discharge: Barriers Resolved   Patient Goals and CMS Choice Patient states their goals for this hospitalization and ongoing recovery are:: return to LTC   Choice offered to / list presented to : Patient [place] ownership interest in [facility].provided to::  (n/a)    Discharge Placement              Patient chooses bed at: [facility] Patient to be transferred to facility by: Spouse Name of family member notified: Jama- spouse Patient and family notified of of transfer: 12/18/24  Discharge Plan and Services Additional resources added to the After Visit Summary for   In-house Referral: Clinical Social Work   Post Acute Care Choice: Nursing Home                               Social Drivers of Health (SDOH) Interventions SDOH Screenings   Food Insecurity: No Food Insecurity (12/16/2024)  Housing: Low Risk (12/16/2024)  Transportation Needs: No Transportation Needs (12/16/2024)  Utilities: Not At Risk (12/16/2024)  Social Connections: Unknown (04/08/2022)   Received from [facility]  Tobacco Use: Medium Risk (12/18/2024)     Readmission Risk Interventions    10/16/2024   10:44 AM 10/15/2024    1:51 PM 11/14/2023   11:51 AM  Readmission Risk Prevention Plan  Transportation Screening Complete Complete Complete  PCP or Specialist Appt within 3-5 Days Complete    HRI or Home Care Consult  Complete Complete Complete  Social Work Consult for Recovery Care Planning/Counseling Complete Complete Complete  Palliative Care Screening Complete -- Not Applicable  Medication Review Oceanographer) Complete Complete Complete

## 2024-12-18 NOTE — Op Note (Signed)
 [facility] Patient Name: [name] Procedure Date: 12/18/2024 11:11 AM MRN: [id number] Date of Birth: May 05, 1961 Attending MD: Carlin POUR. Cindie , [place], [id number] CSN: [id number] Age: 64 Admit Type: Inpatient Procedure:                Colonoscopy Indications:              Rectal bleeding, Acute post hemorrhagic anemia Providers:                Carlin POUR. Cindie, DO, Devere Lodge, Daphne Mulch                            Technician, Technician Referring MD:              Medicines:                See the Anesthesia note for documentation of the                            administered medications Complications:            No immediate complications. Estimated Blood Loss:     Estimated blood loss was minimal. Procedure:                Pre-Anesthesia Assessment:                           - The anesthesia plan was to use monitored                            anesthesia care (MAC).                           After obtaining informed consent, the colonoscope                            was passed under direct vision. Throughout the                            procedure, the patient's blood pressure, pulse, and                            oxygen  saturations were monitored continuously. The                            [id number]-[id number] ([id number]) Peds Colon was introduced                            through the anus and advanced to the the terminal                            ileum, with identification of the appendiceal                            orifice and IC valve. The colonoscopy was performed                            without difficulty. The  patient tolerated the                            procedure well. The quality of the bowel                            preparation was evaluated using the [facility]                            Bowel Preparation Scale) with scores of: Right                            Colon = 2 (minor amount of residual staining, small                            fragments of  stool and/or opaque liquid, but mucosa                            seen well), Transverse Colon = 2 (minor amount of                            residual staining, small fragments of stool and/or                            opaque liquid, but mucosa seen well) and Left Colon                            = 2 (minor amount of residual staining, small                            fragments of stool and/or opaque liquid, but mucosa                            seen well). The total BBPS score equals 6. The                            quality of the bowel preparation was good. Scope In: 11:32:56 AM Scope Out: 11:48:45 AM Scope Withdrawal Time: 0 hours 13 minutes 7 seconds  Total Procedure Duration: 0 hours 15 minutes 49 seconds  Findings:      Non-bleeding internal hemorrhoids were found.      There was evidence of a prior end-to-end colo-colonic anastomosis in the       sigmoid colon. This was patent and was characterized by an intact staple       line. The anastomosis was traversed.      An area of nodular mucosa was found at the anastomosis. ?scar tissue.       Biopsies were taken with a cold forceps for histology.      A 10 mm polyp was found in the transverse colon. The polyp was sessile.       The polyp was removed with a cold snare. Resection and retrieval were       complete.      There is no endoscopic evidence of bleeding in the entire colon.  The terminal ileum appeared normal. Impression:               - Non-bleeding internal hemorrhoids.                           - Patent end-to-end colo-colonic anastomosis,                            characterized by an intact staple line.                           - Nodular mucosa at the colonic anastomosis.                            Biopsied.                           - One 10 mm polyp in the transverse colon, removed                            with a cold snare. Resected and retrieved.                           - The examined portion of the  ileum was normal. Moderate Sedation:      Per Anesthesia Care Recommendation:           - Return patient to hospital ward for ongoing care.                           - Resume regular diet.                           - Await pathology results.                           - Repeat colonoscopy for surveillance based on                            pathology results.                           - Rectal bleeding likely from hemorrhoids.                           - Okay to DC from GI standpoint, repeat CBC in 1                            week. Follow up in GI office in 3-4 weeks. Procedure Code(s):        --- Professional ---                           [phone], Colonoscopy, flexible; with removal of                            tumor(s), polyp(s), or other lesion(s) by snare  technique                           [id number], 59, Colonoscopy, flexible; with biopsy,                            single or multiple Diagnosis Code(s):        --- Professional ---                           Z98.0, Intestinal bypass and anastomosis status                           K63.89, Other specified diseases of intestine                           D12.3, Benign neoplasm of transverse colon (hepatic                            flexure or splenic flexure)                           K64.8, Other hemorrhoids                           K62.5, Hemorrhage of anus and rectum                           D62, Acute posthemorrhagic anemia CPT copyright 2022 American Medical Association. All rights reserved. The codes documented in this report are preliminary and upon coder review may  be revised to meet current compliance requirements. Carlin POUR. Cindie, DO Carlin POUR. Cindie, DO 12/18/2024 12:00:28 PM This report has been signed electronically. Number of Addenda: 0

## 2024-12-18 NOTE — Discharge Summary (Signed)
 " Physician Discharge Summary   Patient: [name] MRN: [id number] DOB: 10-06-1961  Admit date:     12/16/2024  Discharge date: 12/18/2024  Discharge Physician: Alm Jamell Opfer   PCP: Patient, No Pcp Per   Recommendations at discharge:   Please follow up with primary care provider within 1-2 weeks  Please repeat BMP and CBC in one week     Hospital Course:  64 year old female  with medical history significant of COPD on 2 to 3 L of oxygen , DM2, hypertension, CKD 3B, chronic diastolic heart failure, morbid obesity BMI of 50, chronic respiratory failure  immobility and mostly wheelchair-bound, peripheral neuropathy, hypertension, colon cancer s/p resection (08/11/22 LOA Dr. Debby) in remission presents from [facility] with hematochezia.  The patient had a bowel movement after breakfast and noted hematochezia with clots.  She denied any fevers, chills, dizziness, chest pain, shortness breath, nausea, vomiting, diarrhea, abdominal pain.  She denies any new medications.  She is not on any blood thinners. The patient denies any new medications.   The patient has not had any endoscopies since her surgery.  In the ED, the patient was afebrile and hemodynamically stable with oxygen  saturation 96% room air.  WBC 9.8, hemoglobin 12.1, platelets 328.  Sodium 142, potassium 3.7, bicarbonate 36, serum creatinine 2.06.  INR 0.9.  LFTs unremarkable.  FOBT was positive.  Chest x-ray was negative for acute findings. EDP consulted GI, Dr. Cindie.  He recommended admission for colonoscopy.  Assessment and Plan: Hematochezia - Baseline hemoglobin 11-12 - GI consult appreciated - Plans for colonoscopy delayed till 12/18/24 due to incomplete prep - 10/18/25 colonoscopy--nonbleeding internal hemorrhoids.  Patent anastomosis;  transverse colon polyp - No signs of active bleed during hospitalization - Hgb 11.0 on day of dc - repeat CBC one week after d/c;  ok with GI for d/c--discussed with Dr. Cindie    Chronic HFpEF - Appears clinically euvolemic - Last weight at cardiology office 11/14/2024 = 291.6 pounds - Continue home dose torsemide  - 10/14/2024 echo EF 60 to 65%, no WMA, normal RVF, trivial MR/TR  - Daily weights   CKD stage IV - Baseline creatinine 1.9-2.1 - Monitor BMP   Respiratory failure with hypoxia and hypercarbia - Patient is chronically on 2-3 L - stable   Essential hypertension - Continue amlodipine  and hydralazine    Morbid obesity - BMI 54.82 - Lifestyle modification   Hypothyroidism - 10/14/24 TSH 3.420 - Continue Synthroid    Diabetes mellitus type 2 - 10/14/2024 hemoglobin A1c 7.2 - Started on Semglee  daily - NovoLog  sliding scale   Depression - Continue sertraline    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 Davonna -surveillance CTs and CEAs- -08/15/23 CEA 1.8 -12/12/24 CEA 3.5   Mixed hyperlipidemia -Continue statin      Consultants: GI Procedures performed: colonoscopy  Disposition: Home Diet recommendation:  Carb modified diet DISCHARGE MEDICATION: Allergies as of 12/18/2024       Reactions   Carvedilol  Palpitations   Sulfa Antibiotics Swelling   Whole face swells   Trulicity [dulaglutide] Diarrhea   Benicar [olmesartan] Swelling   Ceftriaxone  Hives, Rash   Codeine Other (See Comments)   Confusion         Medication List     STOP taking these medications    HYDROcodone-acetaminophen  5-325 MG tablet Commonly known as: NORCO/VICODIN       TAKE these medications    albuterol  108 (90 Base) MCG/ACT inhaler Commonly known as: VENTOLIN  HFA  Inhale 2 puffs into the lungs every 6 (six) hours as needed for wheezing or shortness of breath.   alum & mag hydroxide-simeth 200-200-20 MG/5ML suspension Commonly known as: MAALOX/MYLANTA Take 30 mLs by mouth every 2 (two) hours as needed for indigestion or heartburn. Do not exceed 4 doses in 24 hours   amLODipine  10 MG  tablet Commonly known as: NORVASC  Take 10 mg by mouth daily.   ascorbic acid  500 MG tablet Commonly known as: VITAMIN C  Take 500 mg by mouth 2 (two) times daily.   aspirin  EC 81 MG tablet Take 81 mg by mouth daily. Swallow whole.   atorvastatin  10 MG tablet Commonly known as: LIPITOR Take 10 mg by mouth at bedtime.   augmented betamethasone dipropionate 0.05 % cream Commonly known as: DIPROLENE-AF Apply 1 application  topically 2 (two) times daily. Apply to trunk, buttocks, legs for Psoriasis   BD AutoShield Duo 30G X 5 MM Misc Generic drug: Insulin  Pen Needle   Biotin  10 MG Tabs Take 10 mg by mouth daily.   Boost Glucose Control Liqd Take 240 mLs by mouth 2 (two) times daily with a meal.   Calmoseptine 0.44-20.6 % Oint Generic drug: Menthol-Zinc  Oxide Apply 1 Application topically daily as needed (preservation/protection after incontinent care).   carboxymethylcellulose 1 % ophthalmic solution Place 2 drops into both eyes every 6 (six) hours as needed (eye dryness/irritation).   cetirizine 10 MG tablet Commonly known as: ZYRTEC Take 10 mg by mouth daily as needed for allergies.   clotrimazole-betamethasone cream Commonly known as: LOTRISONE Apply 1 Application topically 2 (two) times a week. Apply on Tuesday and Friday and as needed for management of chronic fungal rash after shower   Cranberry 300 MG tablet Take 300 mg by mouth 2 (two) times daily.   docusate sodium  100 MG capsule Commonly known as: COLACE Take 100 mg by mouth daily.   estradiol  0.1 MG/GM vaginal cream Commonly known as: ESTRACE  Discard plastic applicator. Insert a blueberry size amount (approximately 1 gram) of cream on fingertip inside vagina at bedtime every night for 1 week then every other night routinely (for long term use).   Eucerin Advanced Repair Crea Apply 1 Application topically every 12 (twelve) hours as needed (hydration of skin). Apply to bilateral lower extremities   feeding  supplement (PRO-STAT 64) Liqd Take 60 mLs by mouth daily.   fluticasone -salmeterol 250-50 MCG/ACT Aepb Commonly known as: ADVAIR Inhale 1 puff into the lungs 2 (two) times daily.   gabapentin  300 MG capsule Commonly known as: NEURONTIN  Take 1 capsule in AM, 1 capsule in PM What changed:  how much to take how to take this when to take this   hydrALAZINE  25 MG tablet Commonly known as: APRESOLINE  Take 1 tablet (25 mg total) by mouth every 8 (eight) hours. What changed: additional instructions   insulin  glargine-yfgn 100 UNIT/ML injection Commonly known as: SEMGLEE  Inject 0.7 mLs (70 Units total) into the skin at bedtime.   ipratropium-albuterol  0.5-2.5 (3) MG/3ML Soln Commonly known as: DUONEB Take 3 mLs by nebulization every 4 (four) hours as needed (shortness of breath/wheezing).   levothyroxine  88 MCG tablet Commonly known as: SYNTHROID  Take 88 mcg by mouth daily before breakfast.   multivitamin with minerals tablet Take 1 tablet by mouth daily.   Myrbetriq  50 MG Tb24 tablet Generic drug: mirabegron  ER Take 1 tablet (50 mg total) by mouth daily.   NovoLOG  100 UNIT/ML injection Generic drug: insulin  aspart Inject 14-20 Units into the skin 3 (  three) times daily with meals. 90-150= 14 units; 151-200= 15 units; 201-250= 16 units; 251-300= 17 units; 301-350= 18 units; 351-400= 19 units; above 400 = 20 units   omeprazole 20 MG capsule Commonly known as: PRILOSEC Take 20 mg by mouth daily.   ondansetron  8 MG tablet Commonly known as: ZOFRAN  Take 8 mg by mouth 2 (two) times daily.   OXYGEN  Inhale 2 L into the lungs continuous.   potassium chloride  SA 20 MEQ tablet Commonly known as: KLOR-CON  M Take 20 mEq by mouth daily.   saccharomyces boulardii 250 MG capsule Commonly known as: FLORASTOR Take 250 mg by mouth 2 (two) times daily.   sertraline  100 MG tablet Commonly known as: ZOLOFT  Take 1 tablet (100 mg total) by mouth daily.   sodium chloride  0.65 % Soln  nasal spray Commonly known as: OCEAN Place 1 spray into both nostrils every 2 (two) hours as needed (dry nasal passages).   Torsemide  40 MG Tabs Take 40 mg by mouth 2 (two) times daily.   trimethoprim  100 MG tablet Commonly known as: TRIMPEX  Take 1 tablet (100 mg total) by mouth daily.        Discharge Exam: Filed Weights   12/16/24 1034  Weight: 123.8 kg   HEENT:  [place]/AT, No thrush, no icterus CV:  RRR, no rub, no S3, no S4 Lung:  CTA, no wheeze, no rhonchi Abd:  soft/+BS, NT Ext:  No edema, no lymphangitis, no synovitis, no rash   Condition at discharge: stable  The results of significant diagnostics from this hospitalization (including imaging, microbiology, ancillary and laboratory) are listed below for reference.   Imaging Studies: DG Chest Port 1 View Result Date: 12/16/2024 CLINICAL DATA:  Rectal bleeding. EXAM: PORTABLE CHEST 1 VIEW COMPARISON:  10/14/2024 and CT chest 06/27/2023. FINDINGS: View is apical lordotic. Trachea is midline. Heart is enlarged. Lungs are clear. No pleural fluid. IMPRESSION: No acute findings. Electronically Signed   By: Newell Eke M.D.   On: 12/16/2024 11:19   CT ABDOMEN PELVIS WO CONTRAST Result Date: 12/12/2024 CLINICAL DATA:  Colon cancer, stage II/III, monitor. * Tracking Code: BO * EXAM: CT ABDOMEN AND PELVIS WITHOUT CONTRAST TECHNIQUE: Multidetector CT imaging of the abdomen and pelvis was performed following the standard protocol without IV contrast. RADIATION DOSE REDUCTION: This exam was performed according to the departmental dose-optimization program which includes automated exposure control, adjustment of the mA and/or kV according to patient size and/or use of iterative reconstruction technique. COMPARISON:  MRI abdomen from 01/31/2024 and CT scan abdomen and pelvis from 12/28/2023. FINDINGS: Lower chest: The lung bases are clear. No pleural effusion. The heart is normal in size. No pericardial effusion. Hepatobiliary: Superior  most portion of the liver is not imaged. Redemonstration of ill-defined hypoattenuating area in the left hepatic lobe, segment 4B region near the gallbladder fossa, which is grossly similar to the prior study. No suspicious liver lesion seen. Visualized liver otherwise appears grossly unremarkable. No intrahepatic or extrahepatic bile duct dilation. Gallbladder is surgically absent. Pancreas: Small/atrophic pancreas, essentially unchanged. No focal lesion or peripancreatic fat stranding. Spleen: Within normal limits. No focal lesion. Adrenals/Urinary Tract: Adrenal glands are unremarkable. No suspicious renal mass within the limitations of this unenhanced exam. No nephroureterolithiasis or obstructive uropathy. There is linear hyperattenuating area arising from the lateral aspect of the right kidney lower pole, which corresponds to benign cyst on the prior exam from 12/28/2023. Findings therefore favor interval involution of previously seen cyst. Urinary bladder is under distended, precluding optimal  assessment. However, no large mass or stones identified. No perivesical fat stranding. Stomach/Bowel: There is a small sliding hiatal hernia. Rectosigmoid anastomotic suture noted. No disproportionate dilation of the small or large bowel loops. No evidence of abnormal bowel wall thickening or inflammatory changes. The appendix is unremarkable. Vascular/Lymphatic: No ascites or pneumoperitoneum. No abdominal or pelvic lymphadenopathy, by size criteria. No aneurysmal dilation of the major abdominal arteries. There are mild peripheral atherosclerotic vascular calcifications of the aorta and its major branches. Reproductive: The uterus is unremarkable. No large adnexal mass. Other: Periumbilical surgical scar noted. The soft tissues and abdominal wall are otherwise unremarkable. Musculoskeletal: No suspicious osseous lesions. There are mild multilevel degenerative changes in the visualized spine. IMPRESSION: 1. No  metastatic colon cancer identified within the abdomen or pelvis. 2. Superior most portion of the liver is not imaged. Stable previously seen ill-defined hypoattenuating area, better characterized on the prior MRI abdomen and favored benign. 3. Multiple other nonacute observations, as described above. Aortic Atherosclerosis (ICD10-I70.0). Electronically Signed   By: Ree Molt M.D.   On: 12/12/2024 14:25    Microbiology: Results for orders placed or performed during the hospital encounter of 10/14/24  Resp panel by RT-PCR (RSV, Flu A&B, Covid) Anterior Nasal Swab     Status: None   Collection Time: 10/14/24  4:16 AM   Specimen: Anterior Nasal Swab  Result Value Ref Range Status   SARS Coronavirus 2 by RT PCR NEGATIVE NEGATIVE Final    Comment: (NOTE) SARS-CoV-2 target nucleic acids are NOT DETECTED.  The SARS-CoV-2 RNA is generally detectable in upper respiratory specimens during the acute phase of infection. The lowest concentration of SARS-CoV-2 viral copies this assay can detect is 138 copies/mL. A negative result does not preclude SARS-Cov-2 infection and should not be used as the sole basis for treatment or other patient management decisions. A negative result may occur with  improper specimen collection/handling, submission of specimen other than nasopharyngeal swab, presence of viral mutation(s) within the areas targeted by this assay, and inadequate number of viral copies(<138 copies/mL). A negative result must be combined with clinical observations, patient history, and epidemiological information. The expected result is Negative.  Fact Sheet for Patients:  bloggercourse.com  Fact Sheet for Healthcare Providers:  seriousbroker.it  This test is no t yet approved or cleared by the [place]  FDA and  has been authorized for detection and/or diagnosis of SARS-CoV-2 by FDA under an Emergency Use Authorization (EUA). This  EUA will remain  in effect (meaning this test can be used) for the duration of the COVID-19 declaration under Section 564(b)(1) of the Act, 21 U.S.C.section 360bbb-3(b)(1), unless the authorization is terminated  or revoked sooner.       Influenza A by PCR NEGATIVE NEGATIVE Final   Influenza B by PCR NEGATIVE NEGATIVE Final    Comment: (NOTE) The Xpert Xpress SARS-CoV-2/FLU/RSV plus assay is intended as an aid in the diagnosis of influenza from Nasopharyngeal swab specimens and should not be used as a sole basis for treatment. Nasal washings and aspirates are unacceptable for Xpert Xpress SARS-CoV-2/FLU/RSV testing.  Fact Sheet for Patients: bloggercourse.com  Fact Sheet for Healthcare Providers: seriousbroker.it  This test is not yet approved or cleared by the [place]  FDA and has been authorized for detection and/or diagnosis of SARS-CoV-2 by FDA under an Emergency Use Authorization (EUA). This EUA will remain in effect (meaning this test can be used) for the duration of the COVID-19 declaration under Section 564(b)(1) of the Act, 21 U.S.C.  section 360bbb-3(b)(1), unless the authorization is terminated or revoked.     Resp Syncytial Virus by PCR NEGATIVE NEGATIVE Final    Comment: (NOTE) Fact Sheet for Patients: bloggercourse.com  Fact Sheet for Healthcare Providers: seriousbroker.it  This test is not yet approved or cleared by the [place]  FDA and has been authorized for detection and/or diagnosis of SARS-CoV-2 by FDA under an Emergency Use Authorization (EUA). This EUA will remain in effect (meaning this test can be used) for the duration of the COVID-19 declaration under Section 564(b)(1) of the Act, 21 U.S.C. section 360bbb-3(b)(1), unless the authorization is terminated or revoked.  Performed at [facility], [address] [place], [place] [address]      Labs: CBC: Recent Labs  Lab 12/12/24 1021 12/16/24 1050 12/17/24 0449 12/18/24 0435  WBC 7.3 9.8 9.2 8.1  NEUTROABS 4.9 7.2  --   --   HGB 11.8* 12.1 10.6* 11.0*  HCT 36.5 36.8 33.7* 35.1*  MCV 80.2 80.2 81.6 82.0  PLT 311 328 296 328   Basic Metabolic Panel: Recent Labs  Lab 12/12/24 1021 12/16/24 1050 12/17/24 0449  NA 139 142 142  K 3.7 3.7 3.8  CL 94* 100 100  CO2 30 36* 37*  GLUCOSE 299* 85 194*  BUN 52* 33* 27*  CREATININE 2.45* 2.06* 1.96*  CALCIUM  9.3 9.5 9.3   Liver Function Tests: Recent Labs  Lab 12/12/24 1021 12/16/24 1050  AST 26 21  ALT 22 14  ALKPHOS 123 110  BILITOT 0.3 0.3  PROT 6.9 6.9  ALBUMIN 3.7 3.6   CBG: Recent Labs  Lab 12/17/24 2020 12/17/24 2359 12/18/24 0404 12/18/24 0721 12/18/24 1106  GLUCAP 266* 223* 141* 181* 252*    Discharge time spent: greater than 30 minutes.  Signed: Alm Schneider, MD [facility] 12/18/2024 "

## 2024-12-18 NOTE — Anesthesia Preprocedure Evaluation (Signed)
"                                    Anesthesia Evaluation  Patient identified by MRN, date of birth, ID band Patient awake    Reviewed: Allergy & Precautions, H&P , NPO status , Patient's Chart, lab work & pertinent test results, reviewed documented beta blocker date and time   History of Anesthesia Complications (+) PONV and history of anesthetic complications  Airway Mallampati: II  TM Distance: >3 FB Neck ROM: full    Dental no notable dental hx.    Pulmonary shortness of breath, asthma , sleep apnea , pneumonia, COPD   Pulmonary exam normal breath sounds clear to auscultation       Cardiovascular Exercise Tolerance: Good hypertension,  Rhythm:regular Rate:Normal     Neuro/Psych  PSYCHIATRIC DISORDERS Anxiety Depression     Neuromuscular disease    GI/Hepatic Neg liver ROS,GERD  ,,  Endo/Other  diabetesHypothyroidism    Renal/GU Renal disease  negative genitourinary   Musculoskeletal   Abdominal   Peds  Hematology  (+) Blood dyscrasia, anemia   Anesthesia Other Findings   Reproductive/Obstetrics negative OB ROS                              Anesthesia Physical Anesthesia Plan  ASA: 3  Anesthesia Plan: MAC   Post-op Pain Management:    Induction:   PONV Risk Score and Plan: Propofol  infusion  Airway Management Planned:   Additional Equipment:   Intra-op Plan:   Post-operative Plan:   Informed Consent: I have reviewed the patients History and Physical, chart, labs and discussed the procedure including the risks, benefits and alternatives for the proposed anesthesia with the patient or authorized representative who has indicated his/her understanding and acceptance.     Dental Advisory Given  Plan Discussed with: CRNA  Anesthesia Plan Comments:         Anesthesia Quick Evaluation  "

## 2024-12-19 ENCOUNTER — Encounter (HOSPITAL_COMMUNITY): Payer: Self-pay | Admitting: Internal Medicine

## 2024-12-20 ENCOUNTER — Ambulatory Visit: Payer: Self-pay | Admitting: Internal Medicine

## 2024-12-20 LAB — SURGICAL PATHOLOGY

## 2024-12-21 NOTE — Anesthesia Postprocedure Evaluation (Signed)
"   Anesthesia Post Note  Patient: [name]  Procedure(s) Performed: COLONOSCOPY POLYPECTOMY, INTESTINE  Patient location during evaluation: Phase II Anesthesia Type: MAC Level of consciousness: awake Pain management: pain level controlled Vital Signs Assessment: post-procedure vital signs reviewed and stable Respiratory status: spontaneous breathing and respiratory function stable Cardiovascular status: blood pressure returned to baseline and stable Postop Assessment: no headache and no apparent nausea or vomiting Anesthetic complications: no Comments: Late entry   No notable events documented.   Last Vitals:  Vitals:   12/18/24 1215 12/18/24 1250  BP: (!) 134/53 (!) 148/61  Pulse: 67 66  Resp: 17 18  Temp: 36.7 C 36.6 C  SpO2: 95% 90%    Last Pain:  Vitals:   12/18/24 1250  TempSrc: Oral  PainSc:                  Yvonna PARAS Nusrat Encarnacion      "

## 2025-01-08 ENCOUNTER — Inpatient Hospital Stay: Admitting: Oncology

## 2025-01-18 ENCOUNTER — Inpatient Hospital Stay: Admitting: Oncology

## 2025-02-28 ENCOUNTER — Ambulatory Visit: Admitting: Nurse Practitioner

## 2025-03-06 ENCOUNTER — Ambulatory Visit: Admitting: Urology

## 2025-05-01 ENCOUNTER — Ambulatory Visit: Admitting: Neurology
